# Patient Record
Sex: Male | Born: 1945
Health system: Southern US, Community
[De-identification: ages and names within clinical notes are randomized; demographics above are authoritative.]

## PROBLEM LIST (undated history)

## (undated) DIAGNOSIS — Z9981 Dependence on supplemental oxygen: Secondary | ICD-10-CM

## (undated) DIAGNOSIS — R21 Rash and other nonspecific skin eruption: Secondary | ICD-10-CM

## (undated) DIAGNOSIS — E119 Type 2 diabetes mellitus without complications: Secondary | ICD-10-CM

## (undated) DIAGNOSIS — M1990 Unspecified osteoarthritis, unspecified site: Secondary | ICD-10-CM

## (undated) DIAGNOSIS — E785 Hyperlipidemia, unspecified: Secondary | ICD-10-CM

## (undated) DIAGNOSIS — R972 Elevated prostate specific antigen [PSA]: Secondary | ICD-10-CM

## (undated) DIAGNOSIS — T4145XA Adverse effect of unspecified anesthetic, initial encounter: Secondary | ICD-10-CM

## (undated) DIAGNOSIS — I4821 Permanent atrial fibrillation: Secondary | ICD-10-CM

## (undated) DIAGNOSIS — J42 Unspecified chronic bronchitis: Secondary | ICD-10-CM

## (undated) DIAGNOSIS — T8859XA Other complications of anesthesia, initial encounter: Secondary | ICD-10-CM

## (undated) DIAGNOSIS — E678 Other specified hyperalimentation: Secondary | ICD-10-CM

## (undated) DIAGNOSIS — G4730 Sleep apnea, unspecified: Secondary | ICD-10-CM

## (undated) DIAGNOSIS — Z7901 Long term (current) use of anticoagulants: Secondary | ICD-10-CM

## (undated) DIAGNOSIS — I10 Essential (primary) hypertension: Secondary | ICD-10-CM

## (undated) DIAGNOSIS — H9190 Unspecified hearing loss, unspecified ear: Secondary | ICD-10-CM

## (undated) DIAGNOSIS — N189 Chronic kidney disease, unspecified: Secondary | ICD-10-CM

## (undated) DIAGNOSIS — I50811 Acute right heart failure: Secondary | ICD-10-CM

## (undated) DIAGNOSIS — I272 Pulmonary hypertension, unspecified: Secondary | ICD-10-CM

## (undated) DIAGNOSIS — R0902 Hypoxemia: Secondary | ICD-10-CM

## (undated) DIAGNOSIS — M109 Gout, unspecified: Secondary | ICD-10-CM

## (undated) DIAGNOSIS — G4733 Obstructive sleep apnea (adult) (pediatric): Secondary | ICD-10-CM

## (undated) DIAGNOSIS — G43009 Migraine without aura, not intractable, without status migrainosus: Secondary | ICD-10-CM

## (undated) HISTORY — DX: Hyperlipidemia, unspecified: E78.5

## (undated) HISTORY — PX: HERNIA REPAIR: SHX51

## (undated) HISTORY — DX: Type 2 diabetes mellitus without complications: E11.9

## (undated) HISTORY — DX: Rash and other nonspecific skin eruption: R21

## (undated) HISTORY — PX: UMBILICAL HERNIA REPAIR: SHX196

## (undated) HISTORY — DX: Hypoxemia: R09.02

## (undated) HISTORY — PX: INGUINAL HERNIA REPAIR: SUR1180

## (undated) HISTORY — DX: Gout, unspecified: M10.9

## (undated) HISTORY — DX: Essential (primary) hypertension: I10

## (undated) HISTORY — PX: SPINE SURGERY: SHX786

## (undated) HISTORY — DX: Elevated prostate specific antigen (PSA): R97.20

## (undated) HISTORY — DX: Sleep apnea, unspecified: G47.30

## (undated) HISTORY — PX: CARDIOVERSION: SHX1299

## (undated) HISTORY — DX: Migraine without aura, not intractable, without status migrainosus: G43.009

## (undated) HISTORY — DX: Long term (current) use of anticoagulants: Z79.01

## (undated) HISTORY — DX: Unspecified chronic bronchitis: J42

## (undated) HISTORY — DX: Other specified hyperalimentation: E67.8

## (undated) HISTORY — PX: FRACTURE SURGERY: SHX138

## (undated) HISTORY — DX: Unspecified hearing loss, unspecified ear: H91.90

## (undated) HISTORY — DX: Chronic kidney disease, unspecified: N18.9

---

## 1972-08-09 HISTORY — PX: APPENDECTOMY: SHX54

## 2004-04-01 LAB — HM COLONOSCOPY: HM Colonoscopy: NORMAL

## 2004-09-28 ENCOUNTER — Ambulatory Visit: Payer: Self-pay | Admitting: Internal Medicine

## 2005-02-22 ENCOUNTER — Ambulatory Visit: Payer: Self-pay | Admitting: Internal Medicine

## 2005-06-04 ENCOUNTER — Ambulatory Visit: Payer: Self-pay | Admitting: Internal Medicine

## 2006-03-03 ENCOUNTER — Ambulatory Visit: Payer: Self-pay | Admitting: Internal Medicine

## 2006-04-03 ENCOUNTER — Emergency Department (HOSPITAL_COMMUNITY): Admission: EM | Admit: 2006-04-03 | Discharge: 2006-04-03 | Payer: Self-pay | Admitting: Family Medicine

## 2006-07-01 ENCOUNTER — Ambulatory Visit: Payer: Self-pay | Admitting: Internal Medicine

## 2006-07-06 ENCOUNTER — Ambulatory Visit: Payer: Self-pay | Admitting: Internal Medicine

## 2007-02-17 ENCOUNTER — Ambulatory Visit: Payer: Self-pay | Admitting: Internal Medicine

## 2007-02-17 LAB — CONVERTED CEMR LAB
Albumin: 3.2 g/dL — ABNORMAL LOW (ref 3.5–5.2)
Alkaline Phosphatase: 82 units/L (ref 39–117)
BUN: 9 mg/dL (ref 6–23)
Basophils Absolute: 0 10*3/uL (ref 0.0–0.1)
Eosinophils Absolute: 0.4 10*3/uL (ref 0.0–0.6)
GFR calc Af Amer: 110 mL/min
Hemoglobin: 14.4 g/dL (ref 13.0–17.0)
Hgb A1c MFr Bld: 7.3 % — ABNORMAL HIGH (ref 4.6–6.0)
Lymphocytes Relative: 22 % (ref 12.0–46.0)
MCHC: 33.1 g/dL (ref 30.0–36.0)
MCV: 84.4 fL (ref 78.0–100.0)
Monocytes Absolute: 0.6 10*3/uL (ref 0.2–0.7)
Monocytes Relative: 5.4 % (ref 3.0–11.0)
Neutro Abs: 7.8 10*3/uL — ABNORMAL HIGH (ref 1.4–7.7)
Neutrophils Relative %: 69.1 % (ref 43.0–77.0)
PSA: 2.82 ng/mL (ref 0.10–4.00)
Potassium: 3.9 meq/L (ref 3.5–5.1)
Sodium: 143 meq/L (ref 135–145)
TSH: 1.32 microintl units/mL (ref 0.35–5.50)

## 2007-02-18 DIAGNOSIS — E785 Hyperlipidemia, unspecified: Secondary | ICD-10-CM | POA: Insufficient documentation

## 2007-02-18 DIAGNOSIS — I10 Essential (primary) hypertension: Secondary | ICD-10-CM | POA: Insufficient documentation

## 2007-03-16 ENCOUNTER — Ambulatory Visit: Payer: Self-pay | Admitting: Internal Medicine

## 2007-03-31 ENCOUNTER — Ambulatory Visit: Payer: Self-pay | Admitting: Internal Medicine

## 2007-03-31 LAB — CONVERTED CEMR LAB
BUN: 16 mg/dL (ref 6–23)
CO2: 38 meq/L — ABNORMAL HIGH (ref 19–32)
Chloride: 101 meq/L (ref 96–112)
Creatinine, Ser: 0.9 mg/dL (ref 0.4–1.5)
HDL: 55.9 mg/dL (ref >39.0)
Hgb A1c MFr Bld: 7 % — ABNORMAL HIGH (ref 4.6–6.0)
PSA: 1.21 ng/mL (ref 0.10–4.00)
Potassium: 4.5 meq/L (ref 3.5–5.1)
Sodium: 145 meq/L (ref 135–145)
Triglycerides: 56 mg/dL (ref 0–149)
VLDL: 11 mg/dL (ref 0–40)

## 2007-04-21 ENCOUNTER — Ambulatory Visit: Payer: Self-pay | Admitting: Internal Medicine

## 2007-06-09 ENCOUNTER — Ambulatory Visit: Payer: Self-pay | Admitting: Internal Medicine

## 2007-06-09 ENCOUNTER — Encounter: Payer: Self-pay | Admitting: Internal Medicine

## 2007-06-09 DIAGNOSIS — G43009 Migraine without aura, not intractable, without status migrainosus: Secondary | ICD-10-CM | POA: Insufficient documentation

## 2007-06-09 DIAGNOSIS — R972 Elevated prostate specific antigen [PSA]: Secondary | ICD-10-CM

## 2007-06-09 DIAGNOSIS — E119 Type 2 diabetes mellitus without complications: Secondary | ICD-10-CM | POA: Insufficient documentation

## 2007-06-09 DIAGNOSIS — H9190 Unspecified hearing loss, unspecified ear: Secondary | ICD-10-CM | POA: Insufficient documentation

## 2007-06-09 HISTORY — DX: Elevated prostate specific antigen (PSA): R97.20

## 2007-08-10 HISTORY — PX: TENDON REPAIR: SHX5111

## 2007-10-16 ENCOUNTER — Ambulatory Visit: Payer: Self-pay | Admitting: Internal Medicine

## 2007-10-16 LAB — CONVERTED CEMR LAB
BUN: 16 mg/dL (ref 6–23)
CO2: 33 meq/L — ABNORMAL HIGH (ref 19–32)
Creatinine, Ser: 1.3 mg/dL (ref 0.4–1.5)
GFR calc Af Amer: 72 mL/min
Glucose, Bld: 118 mg/dL — ABNORMAL HIGH (ref 70–99)
HDL: 30.2 mg/dL — ABNORMAL LOW (ref >39.0)
Potassium: 3.6 meq/L (ref 3.5–5.1)
Sodium: 141 meq/L (ref 135–145)
Total CHOL/HDL Ratio: 3.1
Triglycerides: 71 mg/dL (ref 0–149)

## 2007-10-23 ENCOUNTER — Ambulatory Visit: Payer: Self-pay | Admitting: Internal Medicine

## 2007-10-25 ENCOUNTER — Telehealth (INDEPENDENT_AMBULATORY_CARE_PROVIDER_SITE_OTHER): Payer: Self-pay | Admitting: *Deleted

## 2008-01-05 ENCOUNTER — Ambulatory Visit: Payer: Self-pay | Admitting: Internal Medicine

## 2008-01-05 DIAGNOSIS — R609 Edema, unspecified: Secondary | ICD-10-CM | POA: Insufficient documentation

## 2008-02-08 ENCOUNTER — Inpatient Hospital Stay (HOSPITAL_COMMUNITY): Admission: AD | Admit: 2008-02-08 | Discharge: 2008-02-13 | Payer: Self-pay | Admitting: General Surgery

## 2008-02-08 ENCOUNTER — Ambulatory Visit: Payer: Self-pay | Admitting: Internal Medicine

## 2008-02-08 ENCOUNTER — Ambulatory Visit: Payer: Self-pay | Admitting: Cardiology

## 2008-02-09 ENCOUNTER — Encounter (INDEPENDENT_AMBULATORY_CARE_PROVIDER_SITE_OTHER): Payer: Self-pay | Admitting: Cardiology

## 2008-02-12 ENCOUNTER — Encounter (INDEPENDENT_AMBULATORY_CARE_PROVIDER_SITE_OTHER): Payer: Self-pay | Admitting: General Surgery

## 2008-02-16 ENCOUNTER — Ambulatory Visit: Payer: Self-pay | Admitting: Cardiovascular Disease

## 2008-02-23 ENCOUNTER — Ambulatory Visit: Payer: Self-pay | Admitting: Cardiology

## 2008-02-29 ENCOUNTER — Ambulatory Visit: Payer: Self-pay | Admitting: Internal Medicine

## 2008-03-04 ENCOUNTER — Ambulatory Visit: Payer: Self-pay | Admitting: Cardiology

## 2008-03-04 LAB — CONVERTED CEMR LAB
BUN: 10 mg/dL (ref 6–23)
CO2: 35 meq/L — ABNORMAL HIGH (ref 19–32)
Calcium: 8.8 mg/dL (ref 8.4–10.5)
Creatinine, Ser: 0.9 mg/dL (ref 0.4–1.5)
GFR calc Af Amer: 110 mL/min
Glucose, Bld: 97 mg/dL (ref 70–99)
Sodium: 144 meq/L (ref 135–145)

## 2008-03-07 ENCOUNTER — Ambulatory Visit: Payer: Self-pay | Admitting: Cardiology

## 2008-03-08 ENCOUNTER — Ambulatory Visit: Payer: Self-pay

## 2008-03-14 ENCOUNTER — Ambulatory Visit: Payer: Self-pay | Admitting: Internal Medicine

## 2008-03-21 ENCOUNTER — Ambulatory Visit: Payer: Self-pay | Admitting: Internal Medicine

## 2008-03-28 ENCOUNTER — Ambulatory Visit: Payer: Self-pay | Admitting: Internal Medicine

## 2008-04-04 ENCOUNTER — Ambulatory Visit: Payer: Self-pay | Admitting: Cardiology

## 2008-04-11 ENCOUNTER — Ambulatory Visit: Payer: Self-pay | Admitting: Internal Medicine

## 2008-04-18 ENCOUNTER — Ambulatory Visit: Payer: Self-pay | Admitting: Cardiology

## 2008-04-22 ENCOUNTER — Ambulatory Visit: Payer: Self-pay | Admitting: Cardiology

## 2008-04-22 ENCOUNTER — Ambulatory Visit (HOSPITAL_COMMUNITY): Admission: RE | Admit: 2008-04-22 | Discharge: 2008-04-22 | Payer: Self-pay | Admitting: Cardiology

## 2008-04-29 ENCOUNTER — Telehealth (INDEPENDENT_AMBULATORY_CARE_PROVIDER_SITE_OTHER): Payer: Self-pay | Admitting: *Deleted

## 2008-04-29 ENCOUNTER — Ambulatory Visit: Payer: Self-pay | Admitting: Internal Medicine

## 2008-04-30 ENCOUNTER — Ambulatory Visit: Payer: Self-pay | Admitting: Cardiology

## 2008-04-30 LAB — CONVERTED CEMR LAB
Basophils Absolute: 0.1 10*3/uL (ref 0.0–0.1)
Basophils Relative: 0.5 % (ref 0.0–3.0)
HCT: 46.6 % (ref 39.0–52.0)
Hemoglobin: 15.3 g/dL (ref 13.0–17.0)
Lymphocytes Relative: 10.3 % — ABNORMAL LOW (ref 12.0–46.0)
MCHC: 32.9 g/dL (ref 30.0–36.0)
MCV: 81.6 fL (ref 78.0–100.0)
Monocytes Absolute: 0.3 10*3/uL (ref 0.1–1.0)
Neutro Abs: 13.1 10*3/uL — ABNORMAL HIGH (ref 1.4–7.7)
Prothrombin Time: 37.2 s — ABNORMAL HIGH (ref 10.9–13.3)
RBC: 5.71 M/uL (ref 4.22–5.81)
RDW: 16.4 % — ABNORMAL HIGH (ref 11.5–14.6)

## 2008-05-01 ENCOUNTER — Encounter: Payer: Self-pay | Admitting: Internal Medicine

## 2008-05-01 ENCOUNTER — Telehealth (INDEPENDENT_AMBULATORY_CARE_PROVIDER_SITE_OTHER): Payer: Self-pay | Admitting: *Deleted

## 2008-05-02 ENCOUNTER — Ambulatory Visit: Payer: Self-pay | Admitting: Cardiology

## 2008-05-14 ENCOUNTER — Ambulatory Visit: Payer: Self-pay | Admitting: Cardiovascular Disease

## 2008-05-15 ENCOUNTER — Ambulatory Visit: Payer: Self-pay | Admitting: Internal Medicine

## 2008-05-21 ENCOUNTER — Ambulatory Visit (HOSPITAL_COMMUNITY): Admission: RE | Admit: 2008-05-21 | Discharge: 2008-05-21 | Payer: Self-pay | Admitting: Internal Medicine

## 2008-05-21 ENCOUNTER — Ambulatory Visit: Payer: Self-pay | Admitting: Internal Medicine

## 2008-05-22 ENCOUNTER — Telehealth: Payer: Self-pay | Admitting: Internal Medicine

## 2008-05-24 ENCOUNTER — Ambulatory Visit: Payer: Self-pay | Admitting: Pulmonary Disease

## 2008-05-27 ENCOUNTER — Ambulatory Visit: Payer: Self-pay | Admitting: Internal Medicine

## 2008-05-31 ENCOUNTER — Encounter: Payer: Self-pay | Admitting: Internal Medicine

## 2008-06-04 ENCOUNTER — Ambulatory Visit: Payer: Self-pay | Admitting: Cardiology

## 2008-06-11 ENCOUNTER — Ambulatory Visit: Payer: Self-pay | Admitting: Internal Medicine

## 2008-06-18 ENCOUNTER — Ambulatory Visit (HOSPITAL_BASED_OUTPATIENT_CLINIC_OR_DEPARTMENT_OTHER): Admission: RE | Admit: 2008-06-18 | Discharge: 2008-06-18 | Payer: Self-pay | Admitting: Pulmonary Disease

## 2008-06-18 ENCOUNTER — Ambulatory Visit: Payer: Self-pay | Admitting: Internal Medicine

## 2008-06-18 ENCOUNTER — Encounter: Payer: Self-pay | Admitting: Pulmonary Disease

## 2008-06-25 ENCOUNTER — Ambulatory Visit: Payer: Self-pay | Admitting: Cardiovascular Disease

## 2008-06-27 ENCOUNTER — Ambulatory Visit: Payer: Self-pay | Admitting: Internal Medicine

## 2008-06-27 LAB — CONVERTED CEMR LAB
Basophils Absolute: 0 10*3/uL (ref 0.0–0.1)
Bilirubin Urine: NEGATIVE
Bilirubin, Direct: 0.1 mg/dL (ref 0.0–0.3)
CO2: 31 meq/L (ref 19–32)
Calcium: 9.4 mg/dL (ref 8.4–10.5)
Cholesterol: 121 mg/dL (ref 0–200)
Creatinine, Ser: 0.9 mg/dL (ref 0.4–1.5)
Creatinine,U: 49.3 mg/dL
Eosinophils Absolute: 0.2 10*3/uL (ref 0.0–0.7)
GFR calc Af Amer: 110 mL/min
GFR calc non Af Amer: 91 mL/min
HDL: 40.1 mg/dL (ref >39.0)
Hemoglobin: 15.4 g/dL (ref 13.0–17.0)
Ketones, ur: NEGATIVE mg/dL
LDL Cholesterol: 61 mg/dL (ref 0–99)
Leukocytes, UA: NEGATIVE
Lymphocytes Relative: 26.9 % (ref 12.0–46.0)
MCHC: 33.2 g/dL (ref 30.0–36.0)
MCV: 82 fL (ref 78.0–100.0)
Microalb Creat Ratio: 81.1 mg/g — ABNORMAL HIGH (ref 0.0–30.0)
Microalb, Ur: 4 mg/dL — ABNORMAL HIGH (ref 0.0–1.9)
Monocytes Absolute: 0.6 10*3/uL (ref 0.1–1.0)
Neutro Abs: 6.3 10*3/uL (ref 1.4–7.7)
PSA: 2.65 ng/mL (ref 0.10–4.00)
Platelets: 264 10*3/uL (ref 150–400)
Potassium: 4.1 meq/L (ref 3.5–5.1)
RDW: 18.5 % — ABNORMAL HIGH (ref 11.5–14.6)
Sodium: 148 meq/L — ABNORMAL HIGH (ref 135–145)
TSH: 2.63 microintl units/mL (ref 0.35–5.50)
Total Bilirubin: 1.2 mg/dL (ref 0.3–1.2)
Total CHOL/HDL Ratio: 3
Triglycerides: 98 mg/dL (ref 0–149)
Urine Glucose: NEGATIVE mg/dL
Urobilinogen, UA: 0.2 (ref 0.0–1.0)
pH: 7.5 (ref 5.0–8.0)

## 2008-07-01 ENCOUNTER — Ambulatory Visit: Payer: Self-pay | Admitting: Pulmonary Disease

## 2008-07-02 ENCOUNTER — Ambulatory Visit: Payer: Self-pay | Admitting: Internal Medicine

## 2008-07-11 ENCOUNTER — Ambulatory Visit: Payer: Self-pay | Admitting: Internal Medicine

## 2008-07-15 ENCOUNTER — Ambulatory Visit: Payer: Self-pay | Admitting: Internal Medicine

## 2008-07-25 ENCOUNTER — Ambulatory Visit: Payer: Self-pay | Admitting: Cardiology

## 2008-07-25 LAB — CONVERTED CEMR LAB
ALT: 13 units/L (ref 0–53)
Albumin: 3.2 g/dL — ABNORMAL LOW (ref 3.5–5.2)
Alkaline Phosphatase: 65 units/L (ref 39–117)
Basophils Relative: 0.6 % (ref 0.0–3.0)
Bilirubin, Direct: 0.1 mg/dL (ref 0.0–0.3)
Eosinophils Absolute: 0.2 10*3/uL (ref 0.0–0.7)
Eosinophils Relative: 2 % (ref 0.0–5.0)
HCT: 47.1 % (ref 39.0–52.0)
MCV: 82.8 fL (ref 78.0–100.0)
Monocytes Absolute: 0.9 10*3/uL (ref 0.1–1.0)
Monocytes Relative: 8.1 % (ref 3.0–12.0)
Neutrophils Relative %: 61.8 % (ref 43.0–77.0)
Platelets: 304 10*3/uL (ref 150–400)
RBC: 5.68 M/uL (ref 4.22–5.81)
Total CHOL/HDL Ratio: 3.5
Total Protein: 7 g/dL (ref 6.0–8.3)
WBC: 10.5 10*3/uL (ref 4.5–10.5)

## 2008-07-31 ENCOUNTER — Encounter: Payer: Self-pay | Admitting: Internal Medicine

## 2008-08-01 ENCOUNTER — Ambulatory Visit: Payer: Self-pay | Admitting: Cardiovascular Disease

## 2008-08-06 ENCOUNTER — Ambulatory Visit: Payer: Self-pay | Admitting: Cardiology

## 2008-08-26 ENCOUNTER — Encounter: Payer: Self-pay | Admitting: Internal Medicine

## 2008-09-05 ENCOUNTER — Ambulatory Visit: Payer: Self-pay | Admitting: Internal Medicine

## 2008-10-04 ENCOUNTER — Ambulatory Visit: Payer: Self-pay | Admitting: Pulmonary Disease

## 2008-10-04 ENCOUNTER — Ambulatory Visit: Payer: Self-pay | Admitting: Internal Medicine

## 2008-10-13 ENCOUNTER — Encounter: Payer: Self-pay | Admitting: Pulmonary Disease

## 2008-10-16 ENCOUNTER — Ambulatory Visit: Payer: Self-pay | Admitting: Cardiology

## 2008-10-16 DIAGNOSIS — I4821 Permanent atrial fibrillation: Secondary | ICD-10-CM

## 2008-10-16 LAB — CONVERTED CEMR LAB
Basophils Absolute: 0 10*3/uL (ref 0.0–0.1)
CO2: 31 meq/L (ref 19–32)
Chloride: 106 meq/L (ref 96–112)
Glucose, Bld: 133 mg/dL — ABNORMAL HIGH (ref 70–99)
Hemoglobin: 15.8 g/dL (ref 13.0–17.0)
Lymphocytes Relative: 24.5 % (ref 12.0–46.0)
MCHC: 32.7 g/dL (ref 30.0–36.0)
Monocytes Relative: 5.8 % (ref 3.0–12.0)
Neutrophils Relative %: 67.6 % (ref 43.0–77.0)
Potassium: 3.9 meq/L (ref 3.5–5.1)
RDW: 14.9 % — ABNORMAL HIGH (ref 11.5–14.6)
Sodium: 144 meq/L (ref 135–145)

## 2008-10-17 ENCOUNTER — Encounter: Payer: Self-pay | Admitting: Pulmonary Disease

## 2008-11-01 ENCOUNTER — Ambulatory Visit: Payer: Self-pay | Admitting: Cardiology

## 2008-11-02 ENCOUNTER — Encounter: Payer: Self-pay | Admitting: Pulmonary Disease

## 2008-11-14 ENCOUNTER — Encounter: Payer: Self-pay | Admitting: Pulmonary Disease

## 2008-11-26 ENCOUNTER — Encounter: Payer: Self-pay | Admitting: Pulmonary Disease

## 2008-11-26 ENCOUNTER — Ambulatory Visit (HOSPITAL_BASED_OUTPATIENT_CLINIC_OR_DEPARTMENT_OTHER): Admission: RE | Admit: 2008-11-26 | Discharge: 2008-11-26 | Payer: Self-pay | Admitting: Pulmonary Disease

## 2008-11-26 DIAGNOSIS — E662 Morbid (severe) obesity with alveolar hypoventilation: Secondary | ICD-10-CM

## 2008-11-29 ENCOUNTER — Ambulatory Visit: Payer: Self-pay | Admitting: Cardiology

## 2008-12-04 ENCOUNTER — Ambulatory Visit: Payer: Self-pay | Admitting: Pulmonary Disease

## 2008-12-10 ENCOUNTER — Encounter: Payer: Self-pay | Admitting: Pulmonary Disease

## 2008-12-11 ENCOUNTER — Encounter: Payer: Self-pay | Admitting: Pulmonary Disease

## 2008-12-26 ENCOUNTER — Ambulatory Visit: Payer: Self-pay | Admitting: Internal Medicine

## 2008-12-26 LAB — CONVERTED CEMR LAB
BUN: 14 mg/dL (ref 6–23)
CO2: 32 meq/L (ref 19–32)
Chloride: 106 meq/L (ref 96–112)
GFR calc non Af Amer: 90.57 mL/min (ref >60)
Glucose, Bld: 105 mg/dL — ABNORMAL HIGH (ref 70–99)
HDL: 33.3 mg/dL — ABNORMAL LOW (ref >39.00)
Hgb A1c MFr Bld: 6.3 % (ref 4.6–6.5)
LDL Cholesterol: 58 mg/dL (ref 0–99)
Potassium: 3.7 meq/L (ref 3.5–5.1)
Sodium: 145 meq/L (ref 135–145)
Total CHOL/HDL Ratio: 3
VLDL: 17.2 mg/dL (ref 0.0–40.0)

## 2008-12-27 ENCOUNTER — Ambulatory Visit: Payer: Self-pay | Admitting: Cardiology

## 2008-12-30 ENCOUNTER — Encounter: Payer: Self-pay | Admitting: Pulmonary Disease

## 2008-12-31 ENCOUNTER — Ambulatory Visit: Payer: Self-pay | Admitting: Internal Medicine

## 2009-01-02 ENCOUNTER — Ambulatory Visit: Payer: Self-pay | Admitting: Pulmonary Disease

## 2009-01-02 DIAGNOSIS — J42 Unspecified chronic bronchitis: Secondary | ICD-10-CM

## 2009-01-02 HISTORY — DX: Unspecified chronic bronchitis: J42

## 2009-01-07 ENCOUNTER — Encounter: Payer: Self-pay | Admitting: *Deleted

## 2009-01-17 ENCOUNTER — Ambulatory Visit: Payer: Self-pay | Admitting: Cardiovascular Disease

## 2009-01-28 ENCOUNTER — Encounter: Payer: Self-pay | Admitting: Pulmonary Disease

## 2009-01-28 ENCOUNTER — Ambulatory Visit (HOSPITAL_BASED_OUTPATIENT_CLINIC_OR_DEPARTMENT_OTHER): Admission: RE | Admit: 2009-01-28 | Discharge: 2009-01-28 | Payer: Self-pay | Admitting: Pulmonary Disease

## 2009-01-31 ENCOUNTER — Ambulatory Visit: Payer: Self-pay | Admitting: Cardiology

## 2009-01-31 LAB — CONVERTED CEMR LAB: Prothrombin Time: 20.7 s

## 2009-02-12 ENCOUNTER — Ambulatory Visit: Payer: Self-pay | Admitting: Pulmonary Disease

## 2009-02-12 ENCOUNTER — Encounter: Payer: Self-pay | Admitting: *Deleted

## 2009-02-21 ENCOUNTER — Ambulatory Visit: Payer: Self-pay | Admitting: Cardiology

## 2009-02-24 ENCOUNTER — Telehealth (INDEPENDENT_AMBULATORY_CARE_PROVIDER_SITE_OTHER): Payer: Self-pay | Admitting: *Deleted

## 2009-02-25 ENCOUNTER — Encounter (INDEPENDENT_AMBULATORY_CARE_PROVIDER_SITE_OTHER): Payer: Self-pay | Admitting: *Deleted

## 2009-03-07 ENCOUNTER — Telehealth: Payer: Self-pay | Admitting: Internal Medicine

## 2009-03-12 ENCOUNTER — Telehealth: Payer: Self-pay | Admitting: Internal Medicine

## 2009-03-19 ENCOUNTER — Telehealth: Payer: Self-pay | Admitting: Internal Medicine

## 2009-03-21 ENCOUNTER — Ambulatory Visit: Payer: Self-pay | Admitting: Internal Medicine

## 2009-03-21 LAB — CONVERTED CEMR LAB: POC INR: 2.7

## 2009-03-27 ENCOUNTER — Encounter: Payer: Self-pay | Admitting: Pulmonary Disease

## 2009-04-03 ENCOUNTER — Encounter: Payer: Self-pay | Admitting: Pulmonary Disease

## 2009-04-03 ENCOUNTER — Ambulatory Visit: Payer: Self-pay | Admitting: Pulmonary Disease

## 2009-04-18 ENCOUNTER — Ambulatory Visit: Payer: Self-pay | Admitting: Internal Medicine

## 2009-04-18 LAB — CONVERTED CEMR LAB: POC INR: 2.5

## 2009-05-16 ENCOUNTER — Ambulatory Visit: Payer: Self-pay | Admitting: Internal Medicine

## 2009-05-16 LAB — CONVERTED CEMR LAB: POC INR: 2.7

## 2009-06-13 ENCOUNTER — Ambulatory Visit: Payer: Self-pay | Admitting: Cardiovascular Disease

## 2009-06-20 ENCOUNTER — Ambulatory Visit: Payer: Self-pay | Admitting: Internal Medicine

## 2009-06-20 LAB — CONVERTED CEMR LAB
ALT: 20 units/L (ref 0–53)
Albumin: 3.3 g/dL — ABNORMAL LOW (ref 3.5–5.2)
BUN: 11 mg/dL (ref 6–23)
Basophils Relative: 0.7 % (ref 0.0–3.0)
Calcium: 8.7 mg/dL (ref 8.4–10.5)
Cholesterol: 120 mg/dL (ref 0–200)
Eosinophils Relative: 2.2 % (ref 0.0–5.0)
GFR calc non Af Amer: 90.43 mL/min (ref >60)
Glucose, Bld: 94 mg/dL (ref 70–99)
HCT: 46.7 % (ref 39.0–52.0)
HDL: 33.7 mg/dL — ABNORMAL LOW (ref >39.00)
Hgb A1c MFr Bld: 6.3 % (ref 4.6–6.5)
Ketones, ur: NEGATIVE mg/dL
Leukocytes, UA: NEGATIVE
Lymphs Abs: 2.2 10*3/uL (ref 0.7–4.0)
MCHC: 33 g/dL (ref 30.0–36.0)
MCV: 87.6 fL (ref 78.0–100.0)
Monocytes Absolute: 0.7 10*3/uL (ref 0.1–1.0)
RBC: 5.34 M/uL (ref 4.22–5.81)
Sodium: 145 meq/L (ref 135–145)
Specific Gravity, Urine: 1.01 (ref 1.000–1.030)
TSH: 1.48 microintl units/mL (ref 0.35–5.50)
Total Protein, Urine: 100 mg/dL
Total Protein: 7.3 g/dL (ref 6.0–8.3)
Urine Glucose: NEGATIVE mg/dL
WBC: 8.8 10*3/uL (ref 4.5–10.5)
pH: 7.5 (ref 5.0–8.0)

## 2009-06-24 ENCOUNTER — Ambulatory Visit: Payer: Self-pay | Admitting: Internal Medicine

## 2009-07-11 ENCOUNTER — Ambulatory Visit: Payer: Self-pay | Admitting: Cardiovascular Disease

## 2009-07-11 LAB — CONVERTED CEMR LAB: POC INR: 2.5

## 2009-08-12 ENCOUNTER — Ambulatory Visit: Payer: Self-pay | Admitting: Cardiology

## 2009-08-12 LAB — CONVERTED CEMR LAB: POC INR: 2.1

## 2009-08-25 ENCOUNTER — Ambulatory Visit: Payer: Self-pay | Admitting: Cardiology

## 2009-09-09 ENCOUNTER — Ambulatory Visit: Payer: Self-pay | Admitting: Internal Medicine

## 2009-10-07 ENCOUNTER — Ambulatory Visit: Payer: Self-pay | Admitting: Cardiovascular Disease

## 2009-10-07 LAB — CONVERTED CEMR LAB: POC INR: 2.4

## 2009-11-04 ENCOUNTER — Ambulatory Visit: Payer: Self-pay | Admitting: Internal Medicine

## 2009-11-04 LAB — CONVERTED CEMR LAB: POC INR: 2

## 2009-11-21 ENCOUNTER — Telehealth: Payer: Self-pay | Admitting: Internal Medicine

## 2009-12-02 ENCOUNTER — Ambulatory Visit: Payer: Self-pay | Admitting: Cardiovascular Disease

## 2009-12-02 ENCOUNTER — Encounter: Payer: Self-pay | Admitting: Pulmonary Disease

## 2009-12-18 ENCOUNTER — Ambulatory Visit: Payer: Self-pay | Admitting: Internal Medicine

## 2009-12-18 LAB — CONVERTED CEMR LAB
BUN: 13 mg/dL (ref 6–23)
Chloride: 104 meq/L (ref 96–112)
Cholesterol: 122 mg/dL (ref 0–200)
Creatinine, Ser: 0.8 mg/dL (ref 0.4–1.5)
LDL Cholesterol: 67 mg/dL (ref 0–99)
Total CHOL/HDL Ratio: 3

## 2009-12-23 ENCOUNTER — Ambulatory Visit: Payer: Self-pay | Admitting: Internal Medicine

## 2009-12-30 ENCOUNTER — Ambulatory Visit: Payer: Self-pay | Admitting: Internal Medicine

## 2009-12-30 LAB — CONVERTED CEMR LAB: POC INR: 2.5

## 2010-02-20 ENCOUNTER — Ambulatory Visit: Payer: Self-pay | Admitting: Cardiology

## 2010-02-20 LAB — CONVERTED CEMR LAB: POC INR: 2.8

## 2010-03-12 ENCOUNTER — Telehealth: Payer: Self-pay | Admitting: Internal Medicine

## 2010-03-12 ENCOUNTER — Ambulatory Visit: Payer: Self-pay | Admitting: Internal Medicine

## 2010-03-12 DIAGNOSIS — R21 Rash and other nonspecific skin eruption: Secondary | ICD-10-CM

## 2010-03-12 DIAGNOSIS — L03119 Cellulitis of unspecified part of limb: Secondary | ICD-10-CM

## 2010-03-12 DIAGNOSIS — L02419 Cutaneous abscess of limb, unspecified: Secondary | ICD-10-CM | POA: Insufficient documentation

## 2010-03-12 HISTORY — DX: Rash and other nonspecific skin eruption: R21

## 2010-03-20 ENCOUNTER — Ambulatory Visit: Payer: Self-pay | Admitting: Internal Medicine

## 2010-03-26 ENCOUNTER — Ambulatory Visit: Payer: Self-pay | Admitting: Internal Medicine

## 2010-04-01 ENCOUNTER — Telehealth (INDEPENDENT_AMBULATORY_CARE_PROVIDER_SITE_OTHER): Payer: Self-pay | Admitting: *Deleted

## 2010-04-03 ENCOUNTER — Ambulatory Visit: Payer: Self-pay | Admitting: Pulmonary Disease

## 2010-04-08 ENCOUNTER — Encounter: Payer: Self-pay | Admitting: Pulmonary Disease

## 2010-04-09 ENCOUNTER — Encounter: Payer: Self-pay | Admitting: Pulmonary Disease

## 2010-04-09 ENCOUNTER — Ambulatory Visit: Payer: Self-pay | Admitting: Internal Medicine

## 2010-04-09 LAB — CONVERTED CEMR LAB
BUN: 15 mg/dL (ref 6–23)
Calcium: 9 mg/dL (ref 8.4–10.5)
Creatinine, Ser: 0.9 mg/dL (ref 0.4–1.5)
GFR calc non Af Amer: 85.78 mL/min (ref >60)
Potassium: 4.2 meq/L (ref 3.5–5.1)

## 2010-04-17 ENCOUNTER — Ambulatory Visit: Payer: Self-pay | Admitting: Internal Medicine

## 2010-04-17 LAB — CONVERTED CEMR LAB: POC INR: 3.6

## 2010-04-25 ENCOUNTER — Encounter: Payer: Self-pay | Admitting: Pulmonary Disease

## 2010-04-27 ENCOUNTER — Encounter: Payer: Self-pay | Admitting: Pulmonary Disease

## 2010-05-11 ENCOUNTER — Ambulatory Visit: Payer: Self-pay | Admitting: Cardiology

## 2010-06-08 ENCOUNTER — Encounter: Payer: Self-pay | Admitting: Internal Medicine

## 2010-06-08 ENCOUNTER — Ambulatory Visit: Payer: Self-pay | Admitting: Surgery

## 2010-06-09 ENCOUNTER — Ambulatory Visit: Payer: Self-pay | Admitting: Cardiovascular Disease

## 2010-06-22 ENCOUNTER — Ambulatory Visit: Payer: Self-pay | Admitting: Pulmonary Disease

## 2010-06-22 ENCOUNTER — Ambulatory Visit: Payer: Self-pay | Admitting: Internal Medicine

## 2010-06-22 LAB — CONVERTED CEMR LAB
AST: 19 units/L (ref 0–37)
Alkaline Phosphatase: 63 units/L (ref 39–117)
Bilirubin Urine: NEGATIVE
Calcium: 8.6 mg/dL (ref 8.4–10.5)
Creatinine,U: 94.1 mg/dL
Eosinophils Absolute: 0.2 10*3/uL (ref 0.0–0.7)
GFR calc non Af Amer: 78.91 mL/min (ref >60)
HDL: 37.2 mg/dL — ABNORMAL LOW (ref >39.00)
Ketones, ur: NEGATIVE mg/dL
MCHC: 33 g/dL (ref 30.0–36.0)
MCV: 84 fL (ref 78.0–100.0)
Microalb, Ur: 9.4 mg/dL — ABNORMAL HIGH (ref 0.0–1.9)
Monocytes Absolute: 0.9 10*3/uL (ref 0.1–1.0)
Neutrophils Relative %: 62.5 % (ref 43.0–77.0)
Nitrite: NEGATIVE
Platelets: 269 10*3/uL (ref 150.0–400.0)
Potassium: 4.2 meq/L (ref 3.5–5.1)
RDW: 17.9 % — ABNORMAL HIGH (ref 11.5–14.6)
Sodium: 141 meq/L (ref 135–145)
TSH: 1.62 microintl units/mL (ref 0.35–5.50)
Total Bilirubin: 1.2 mg/dL (ref 0.3–1.2)
Total CHOL/HDL Ratio: 3
Total Protein, Urine: NEGATIVE mg/dL
Triglycerides: 124 mg/dL (ref 0.0–149.0)
VLDL: 24.8 mg/dL (ref 0.0–40.0)

## 2010-06-26 ENCOUNTER — Ambulatory Visit: Payer: Self-pay | Admitting: Internal Medicine

## 2010-06-26 ENCOUNTER — Encounter: Payer: Self-pay | Admitting: Internal Medicine

## 2010-06-26 DIAGNOSIS — K921 Melena: Secondary | ICD-10-CM

## 2010-06-30 ENCOUNTER — Ambulatory Visit: Payer: Self-pay | Admitting: Cardiology

## 2010-06-30 LAB — CONVERTED CEMR LAB: POC INR: 2.7

## 2010-07-28 ENCOUNTER — Ambulatory Visit: Payer: Self-pay | Admitting: Cardiology

## 2010-08-18 ENCOUNTER — Ambulatory Visit: Admission: RE | Admit: 2010-08-18 | Discharge: 2010-08-18 | Payer: Self-pay | Source: Home / Self Care

## 2010-08-18 LAB — CONVERTED CEMR LAB: POC INR: 3.2

## 2010-08-21 ENCOUNTER — Telehealth: Payer: Self-pay | Admitting: Internal Medicine

## 2010-08-30 ENCOUNTER — Encounter: Payer: Self-pay | Admitting: Internal Medicine

## 2010-08-31 ENCOUNTER — Encounter: Payer: Self-pay | Admitting: Internal Medicine

## 2010-08-31 ENCOUNTER — Ambulatory Visit
Admission: RE | Admit: 2010-08-31 | Discharge: 2010-08-31 | Payer: Self-pay | Source: Home / Self Care | Attending: Surgery | Admitting: Surgery

## 2010-09-01 NOTE — Assessment & Plan Note (Addendum)
OFFICE VISIT  [name], [name] DOB:  [birth year]                                       08/31/2010 CHART#:[id number]  REASON FOR VISIT:  Follow up leg swelling.  HISTORY:  This is a very pleasant 65 year old gentleman that I initially saw at the request of Dr. Terri Piedra for bilateral leg swelling which had been occurring for the past 2 years.  At that time he had several open wounds.  These have subsequently all healed.  I fit him for compression stockings and evaluated him with ultrasound.  Ultrasound was negative for reflux.  Unfortunately he is unable to get the stockings over his legs and get them on to wear them.  Currently he does not endorse active wounds.  PHYSICAL EXAM:  Vital signs:  Heart rate 84, blood pressure 162/87, respiratory rate 16.  General:  He is well-appearing, in no distress. Abdomen:  Obese.  Extremities:  Reveal significant pitting edema without active ulcerations, significant hyperpigmentation.  At this point I think we are left with leg elevation and compression stockings.  I have told him to revisit one of the compression stocking distribution stores to see if there are any tricks to getting the socks on.  They are going to do this.  I am also going to recommend that he sees a lymphedema therapist to [place] to see if there are any massage techniques that may help minimize the swelling.  He understands that this will be a life long chronic problem he has to deal with.    [name], MD Electronically Signed  VWB/MEDQ  D:  08/31/2010  T:  09/01/2010  Job:  [id number]  cc:   Elinor Parkinson. Worthy Rancher, M.D. Corwin Levins, MD

## 2010-09-08 ENCOUNTER — Ambulatory Visit: Admission: RE | Admit: 2010-09-08 | Discharge: 2010-09-08 | Payer: Self-pay | Source: Home / Self Care

## 2010-09-08 NOTE — Progress Notes (Signed)
Summary: med Refill  Phone Note From Pharmacy   Caller: [facility]* Summary of Call: Pharmacy called requesting refill on Diltiazem HCL 300mg . Pharmacy stated Dr. Jens Som changed to 300mg  08/12/2008 Initial call taken by: Scharlene Gloss,  November 21, 2009 3:00 PM  Follow-up for Phone Call        ok - to robin to handle Follow-up by: Corwin Levins MD,  November 21, 2009 3:14 PM    New/Updated Medications: DILTIAZEM HCL ER BEADS 300 MG XR24H-CAP (DILTIAZEM HCL ER BEADS) 1 by mouth once daily Prescriptions: DILTIAZEM HCL ER BEADS 300 MG XR24H-CAP (DILTIAZEM HCL ER BEADS) 1 by mouth once daily  #30 x 8   Entered by:   Scharlene Gloss   Authorized by:   Corwin Levins MD   Signed by:   Scharlene Gloss on 11/21/2009   Method used:   Faxed to ...       [facility]       [address] [place], [place]  [address]       Ph: [phone] or [phone]       Fax: [phone]   RxID:   [id number]

## 2010-09-08 NOTE — Letter (Signed)
Summary: CMN for CPAP Supplies/[facility]  CMN for CPAP Supplies/[facility]   Imported By: Sherian Rein 12/08/2009 07:54:18  _____________________________________________________________________  External Attachment:    Type:   Image     Comment:   External Document

## 2010-09-08 NOTE — Letter (Signed)
Summary: Statement of Medical Necessity/ [facility]  Statement of Medical Necessity/ [facility]   Imported By: Lennie Odor 05/04/2010 11:22:04  _____________________________________________________________________  External Attachment:    Type:   Image     Comment:   External Document

## 2010-09-08 NOTE — Medication Information (Signed)
Summary: rov/tm  Anticoagulant Therapy  Managed by: Cloyde Reams, RN, BSN Referring MD: Olga Millers MD PCP: Dr. Gwen Pounds MD: Eden Emms MD, Theron Arista Indication 1: Atrial Fibrillation (ICD-427.31) Lab Used: [facility]  Site: [facility] INR POC 2.5 INR RANGE 2 - 3  Dietary changes: no    Health status changes: no    Bleeding/hemorrhagic complications: no    Recent/future hospitalizations: no    Any changes in medication regimen? no    Recent/future dental: no  Any missed doses?: no       Is patient compliant with meds? yes       Allergies (verified): No Known Drug Allergies  Anticoagulation Management History:      The patient is taking warfarin and comes in today for a routine follow up visit.  Positive risk factors for bleeding include presence of serious comorbidities.  Negative risk factors for bleeding include an age less than 83 years old.  The bleeding index is 'intermediate risk'.  Positive CHADS2 values include History of HTN and History of Diabetes.  Negative CHADS2 values include Age > 16 years old.  The start date was 02/09/2008.  His last INR was 3.6 RATIO.  Anticoagulation responsible provider: Eden Emms MD, Theron Arista.  INR POC: 2.5.  Cuvette Lot#: [id number].  Exp: 01/2011.    Anticoagulation Management Assessment/Plan:      The patient's current anticoagulation dose is Warfarin sodium 5 mg tabs: use asd.  The target INR is 2 - 3.  The next INR is due 12/30/2009.  Anticoagulation instructions were given to patient.  Results were reviewed/authorized by Cloyde Reams, RN, BSN.  He was notified by Cloyde Reams RN.         Prior Anticoagulation Instructions: INR 2.0 Today take 1 1/2 pills, then resume 1 pilll everyday except 1/2 pill on Mondays. Recheck in 4 weeks.    Current Anticoagulation Instructions: INR 2.5  Continue on same dosage 1 tablet daily except 1/2 tablet on Mondays.  Recheck in 4 weeks.

## 2010-09-08 NOTE — Medication Information (Signed)
Summary: rov/ewj  Anticoagulant Therapy  Managed by: Bethena Midget, RN, BSN Referring MD: Olga Millers MD PCP: Dr. Gwen Pounds MD: Juanda Chance MD, Iman Reinertsen Indication 1: Atrial Fibrillation (ICD-427.31) Lab Used: [facility] Site: [facility] INR POC 2.1 INR RANGE 2 - 3  Dietary changes: no    Health status changes: no    Bleeding/hemorrhagic complications: no    Recent/future hospitalizations: no    Any changes in medication regimen? no    Recent/future dental: no  Any missed doses?: no       Is patient compliant with meds? yes       Allergies: No Known Drug Allergies  Anticoagulation Management History:      The patient is taking warfarin and comes in today for a routine follow up visit.  Positive risk factors for bleeding include presence of serious comorbidities.  Negative risk factors for bleeding include an age less than 24 years old.  The bleeding index is 'intermediate risk'.  Positive CHADS2 values include History of HTN and History of Diabetes.  Negative CHADS2 values include Age > 79 years old.  The start date was 02/09/2008.  His last INR was 3.6 RATIO.  Anticoagulation responsible provider: Juanda Chance MD, Smitty Cords.  INR POC: 2.1.  Cuvette Lot#: [id number].  Exp: 09/2010.    Anticoagulation Management Assessment/Plan:      The patient's current anticoagulation dose is Warfarin sodium 5 mg tabs: use asd.  The target INR is 2 - 3.  The next INR is due 09/09/2009.  Anticoagulation instructions were given to patient.  Results were reviewed/authorized by Bethena Midget, RN, BSN.  He was notified by Bethena Midget, RN, BSN.         Prior Anticoagulation Instructions: INR 2.5  Take 1.5 tablets today then resume same dosage 1 tablet daily except 1/2 tablet on Mondays.  Recheck in 4 weeks.    Current Anticoagulation Instructions: INR 2.1 Continue 5mg s everyday except 2.5mg s on Mondays. Recheck in 4 weeks.

## 2010-09-08 NOTE — Medication Information (Signed)
Summary: rov/tm  Anticoagulant Therapy  Managed by: Eda Keys, PharmD Referring MD: Olga Millers MD PCP: Dr. Gwen Pounds MD: Graciela Husbands MD, Viviann Spare Indication 1: Atrial Fibrillation (ICD-427.31) Lab Used: [facility]  Site: [facility] INR POC 2.3 INR RANGE 2 - 3  Dietary changes: no    Health status changes: no    Bleeding/hemorrhagic complications: no    Recent/future hospitalizations: no    Any changes in medication regimen? no    Recent/future dental: no  Any missed doses?: no       Is patient compliant with meds? yes       Current Medications (verified): 1)  Allopurinol 100 Mg Tabs (Allopurinol) .Marland Kitchen.. 1 Tablet By Mouth Once A Day 2)  Metoprolol Tartrate 50 Mg Tabs (Metoprolol Tartrate) .Marland Kitchen.. 1 By Mouth Two Times A Day 3)  Lisinopril 20 Mg Tabs (Lisinopril) .Marland Kitchen.. 1 By Mouth Once Daily 4)  Cardizem La 360 Mg Xr24h-Tab (Diltiazem Hcl Coated Beads) .Marland Kitchen.. 1 By Mouth Daily 5)  Warfarin Sodium 5 Mg Tabs (Warfarin Sodium) .... Use Asd 6)  Furosemide 40 Mg Tabs (Furosemide) .... Take 1 Two Times A Day 7)  Klor-Con M20 20 Meq Tbcr (Potassium Chloride Crys Cr) .... Take 1 Tablet By Mouth Once A Day 8)  Simvastatin 40 Mg Tabs (Simvastatin) .... Take 1 Tablet By Mouth Once A Day 9)  Glimepiride 1 Mg  Tabs (Glimepiride) .Marland Kitchen.. 1 and 1/2 By Mouth Qam 10)  Onetouch Ultra Test  Strp (Glucose Blood) .... Use 1strip  Two Times A Day To Test Blood Glucose  Allergies (verified): No Known Drug Allergies  Anticoagulation Management History:      The patient is taking warfarin and comes in today for a routine follow up visit.  Positive risk factors for bleeding include presence of serious comorbidities.  Negative risk factors for bleeding include an age less than 22 years old.  The bleeding index is 'intermediate risk'.  Positive CHADS2 values include History of HTN and History of Diabetes.  Negative CHADS2 values include Age > 6 years old.  The start date was 02/09/2008.  His last INR  was 3.6 RATIO.  Anticoagulation responsible provider: Graciela Husbands MD, Viviann Spare.  INR POC: 2.3.  Cuvette Lot#: [id number].  Exp: 11/2010.    Anticoagulation Management Assessment/Plan:      The patient's current anticoagulation dose is Warfarin sodium 5 mg tabs: use asd.  The target INR is 2 - 3.  The next INR is due 10/07/2009.  Anticoagulation instructions were given to patient.  Results were reviewed/authorized by Eda Keys, PharmD.  He was notified by Eda Keys.         Prior Anticoagulation Instructions: INR 2.1 Continue 5mg s everyday except 2.5mg s on Mondays. Recheck in 4 weeks.   Current Anticoagulation Instructions: INR 2.3  Continue current dosing schedule. Take 1/2 tablet on Monday and take 1 tablet all other days. Return 4 weeks.

## 2010-09-08 NOTE — Medication Information (Signed)
Summary: rov/ewj  Anticoagulant Therapy  Managed by: Bethena Midget, RN, BSN Referring MD: Olga Millers MD PCP: Dr. Gwen Pounds MD: Gala Romney MD, Reuel Boom Indication 1: Atrial Fibrillation (ICD-427.31) Lab Used: [facility] Site: [facility] INR POC 2.0 INR RANGE 2 - 3  Dietary changes: no    Health status changes: no    Bleeding/hemorrhagic complications: no    Recent/future hospitalizations: no    Any changes in medication regimen? no    Recent/future dental: no  Any missed doses?: no       Is patient compliant with meds? yes       Allergies: No Known Drug Allergies  Anticoagulation Management History:      The patient is taking warfarin and comes in today for a routine follow up visit.  Positive risk factors for bleeding include presence of serious comorbidities.  Negative risk factors for bleeding include an age less than 62 years old.  The bleeding index is 'intermediate risk'.  Positive CHADS2 values include History of HTN and History of Diabetes.  Negative CHADS2 values include Age > 67 years old.  The start date was 02/09/2008.  His last INR was 3.6 RATIO.  Anticoagulation responsible provider: Bensimhon MD, Reuel Boom.  INR POC: 2.0.  Cuvette Lot#: [id number].  Exp: 12/2010.    Anticoagulation Management Assessment/Plan:      The patient's current anticoagulation dose is Warfarin sodium 5 mg tabs: use asd.  The target INR is 2 - 3.  The next INR is due 12/02/2009.  Anticoagulation instructions were given to patient.  Results were reviewed/authorized by Bethena Midget, RN, BSN.  He was notified by Bethena Midget, RN, BSN.         Prior Anticoagulation Instructions: INR 2.4  Continue on same dosage 1 tablet daily except 1/2 tablet on Mondays.  Recheck in 4 weeks.    Current Anticoagulation Instructions: INR 2.0 Today take 1 1/2 pills, then resume 1 pilll everyday except 1/2 pill on Mondays. Recheck in 4 weeks.

## 2010-09-08 NOTE — Medication Information (Signed)
Summary: rov/ewj  Anticoagulant Therapy  Managed by: Weston Brass, PharmD Referring MD: Olga Millers MD PCP: Dr. Gwen Pounds MD: Gala Romney MD, Reuel Boom Indication 1: Atrial Fibrillation (ICD-427.31) Lab Used: [facility] Site: [facility] INR POC 2.5 INR RANGE 2 - 3  Dietary changes: no    Health status changes: no    Bleeding/hemorrhagic complications: no    Recent/future hospitalizations: no    Any changes in medication regimen? no    Recent/future dental: no  Any missed doses?: no       Is patient compliant with meds? yes       Allergies: No Known Drug Allergies  Anticoagulation Management History:      The patient is taking warfarin and comes in today for a routine follow up visit.  Positive risk factors for bleeding include presence of serious comorbidities.  Negative risk factors for bleeding include an age less than 66 years old.  The bleeding index is 'intermediate risk'.  Positive CHADS2 values include History of HTN and History of Diabetes.  Negative CHADS2 values include Age > 38 years old.  The start date was 02/09/2008.  His last INR was 3.6 RATIO.  Anticoagulation responsible provider: Bensimhon MD, Reuel Boom.  INR POC: 2.5.  Cuvette Lot#: [id number].  Exp: 03/2011.    Anticoagulation Management Assessment/Plan:      The patient's current anticoagulation dose is Warfarin sodium 5 mg tabs: use asd.  The target INR is 2 - 3.  The next INR is due 01/27/2010.  Anticoagulation instructions were given to patient.  Results were reviewed/authorized by Weston Brass, PharmD.  He was notified by Weston Brass PharmD.         Prior Anticoagulation Instructions: INR 2.5  Continue on same dosage 1 tablet daily except 1/2 tablet on Mondays.  Recheck in 4 weeks.    Current Anticoagulation Instructions: INR 2.5  Continue same dose of 1 tablet every day except 1/2 tablet on Monday

## 2010-09-08 NOTE — Medication Information (Signed)
Summary: rov/eac  Anticoagulant Therapy  Managed by: [name] Reams, [name] [name], [name] [name] Referring MD: Olga Millers MD PCP: Dr. Gwen Pounds MD: Clifton James MD, Cristal Deer Indication 1: Atrial Fibrillation (ICD-427.31) Lab Used: [facility] Site: [facility] INR POC 2.4 INR RANGE 2 - 3  Dietary changes: no    Health status changes: no    Bleeding/hemorrhagic complications: no    Recent/future hospitalizations: no    Any changes in medication regimen? no    Recent/future dental: no  Any missed doses?: no       Is patient compliant with meds? yes       Allergies (verified): No Known Drug Allergies  Anticoagulation Management History:      The patient is taking warfarin and comes in today for a routine follow up visit.  Positive risk factors for bleeding include presence of serious comorbidities.  Negative risk factors for bleeding include an age less than 30 years old.  The bleeding index is 'intermediate risk'.  Positive CHADS2 values include History of HTN and History of Diabetes.  Negative CHADS2 values include Age > 18 years old.  The start date was 02/09/2008.  His last INR was 3.6 RATIO.  Anticoagulation responsible provider: Clifton James MD, Cristal Deer.  INR POC: 2.4.  Cuvette Lot#: [id number].  Exp: 12/2010.    Anticoagulation Management Assessment/Plan:      The patient's current anticoagulation dose is Warfarin sodium 5 mg tabs: use asd.  The target INR is 2 - 3.  The next INR is due 11/04/2009.  Anticoagulation instructions were given to patient.  Results were reviewed/authorized by [name] Reams, [name] [name], [name] [name].  He was notified by [name] Reams [name] [name].         Prior Anticoagulation Instructions: INR 2.3  Continue current dosing schedule. Take 1/2 tablet on Monday and take 1 tablet all other days. Return 4 weeks.  Current Anticoagulation Instructions: INR 2.4  Continue on same dosage 1 tablet daily except 1/2 tablet on Mondays.  Recheck in 4 weeks.

## 2010-09-08 NOTE — Medication Information (Signed)
Summary: rov/ewj   Anticoagulant Therapy  Managed by: Cloyde Reams, RN, BSN Referring MD: Olga Millers MD PCP: Dr. Gwen Pounds MD: Kristeen Miss Indication 1: Atrial Fibrillation (ICD-427.31) Lab Used: [facility] Site: [facility] INR POC 3.1 INR RANGE 2 - 3  Dietary changes: no    Health status changes: no    Bleeding/hemorrhagic complications: no    Recent/future hospitalizations: no    Any changes in medication regimen? yes       Details: had an increase in dose in one of his medicines but can't think of the name  Recent/future dental: no  Any missed doses?: no       Is patient compliant with meds? yes       Allergies: No Known Drug Allergies  Anticoagulation Management History:      The patient is taking warfarin and comes in today for a routine follow up visit.  Positive risk factors for bleeding include presence of serious comorbidities.  Negative risk factors for bleeding include an age less than 51 years old.  The bleeding index is 'intermediate risk'.  Positive CHADS2 values include History of HTN and History of Diabetes.  Negative CHADS2 values include Age > 68 years old.  The start date was 02/09/2008.  His last INR was 3.6 RATIO.  Anticoagulation responsible Jniya Madara: Kristeen Miss.  INR POC: 3.1.  Cuvette Lot#: [id number].  Exp: 06/2011.    Anticoagulation Management Assessment/Plan:      The patient's current anticoagulation dose is Warfarin sodium 5 mg tabs: use asd.  The target INR is 2 - 3.  The next INR is due 06/30/2010.  Anticoagulation instructions were given to patient.  Results were reviewed/authorized by Cloyde Reams, RN, BSN.         Prior Anticoagulation Instructions: INR 2.0  Continue on same dosage 1 tablet daily except 1.2 tablet on Mondays.  Recheck in 4 weeks.    Current Anticoagulation Instructions: INR 3.1 Take a half tablet today instead of taking a full tablet. Then resume normal schedule of a half tablet on Mondays and a  whole tablet all other days. Recheck in 3 weeks.

## 2010-09-08 NOTE — Medication Information (Signed)
Summary: rov/ewj  Anticoagulant Therapy  Managed by: Cloyde Reams, RN, BSN Referring MD: Olga Millers MD PCP: Dr. Gwen Pounds MD: Myrtis Ser MD, Tinnie Gens Indication 1: Atrial Fibrillation (ICD-427.31) Lab Used: [facility] Site: [facility] INR POC 2.0 INR RANGE 2 - 3  Dietary changes: no    Health status changes: no    Bleeding/hemorrhagic complications: no    Recent/future hospitalizations: no    Any changes in medication regimen? yes       Details: Incr Furosemide 80 in am and 40 in pm.  Recent/future dental: no  Any missed doses?: yes     Details: Missed 1 dosage Friday pm.   Is patient compliant with meds? yes       Allergies: No Known Drug Allergies  Anticoagulation Management History:      The patient is taking warfarin and comes in today for a routine follow up visit.  Positive risk factors for bleeding include presence of serious comorbidities.  Negative risk factors for bleeding include an age less than 71 years old.  The bleeding index is 'intermediate risk'.  Positive CHADS2 values include History of HTN and History of Diabetes.  Negative CHADS2 values include Age > 60 years old.  The start date was 02/09/2008.  His last INR was 3.6 RATIO.  Anticoagulation responsible Geddy [place]: Myrtis Ser MD, Tinnie Gens.  INR POC: 2.0.  Cuvette Lot#: [id number].  Exp: 06/2011.    Anticoagulation Management Assessment/Plan:      The patient's current anticoagulation dose is Warfarin sodium 5 mg tabs: use asd.  The target INR is 2 - 3.  The next INR is due 06/09/2010.  Anticoagulation instructions were given to patient.  Results were reviewed/authorized by Cloyde Reams, RN, BSN.  He was notified by Cloyde Reams RN.         Prior Anticoagulation Instructions: INR 3.6  Skip 1 dose of Coumadin, then resume same dosage 1 tablet daily except 1/2 tablet on Mondays.  Recheck in 3 weeks.    Current Anticoagulation Instructions: INR 2.0  Continue on same dosage 1 tablet daily except 1.2 tablet  on Mondays.  Recheck in 4 weeks.

## 2010-09-08 NOTE — Assessment & Plan Note (Signed)
Summary: CPX/  [facility]  #   Vital Signs:  Patient profile:   65 year old male Height:      68 inches Weight:      289.50 pounds BMI:     44.18 O2 Sat:      93 % on Room air Temp:     97.7 degrees F oral Pulse rate:   101 / minute BP sitting:   148 / 92  (left arm) Cuff size:   large  Vitals Entered By: Zella Ball Ewing CMA Duncan Dull) (June 26, 2010 8:26 AM)  O2 Flow:  Room air  CC: Adult Physical/RE   Primary Care Brendalee Matthies:  Dr. Jonny Ruiz  CC:  Adult Physical/RE.  History of Present Illness: here for wellness;  seen recently oct 31 per vascular specialist/dr brabham, felt to have LE lymphedemda , rx LE stocking but not yet started using this;  swelling persists no change so far - no signs of cellulititis;  Pt denies CP, worsening sob, doe, wheezing, orthopnea, pnd, worsening LE edema, palps, dizziness or syncope  Pt denies new neuro symptoms such as headache, facial or extremity weakness  Pt denies polydipsia, polyuria, or low sugar symptoms such as shakiness improved with eating.  Overall good compliance with meds, trying to follow low chol, DM diet, wt stable, little excercise however No fever, wt loss, night sweats, loss of appetite or other constitutional symptoms Overall good compliance with meds, and good tolerability. Denies worsening depressive symptoms, suicidal ideation, or panic.  Pt states good ability with ADL's, low fall risk, home safety reviewed and adequate, no significant change in hearing or vision, trying to follow lower chol diet, and occasionally active only with regular excercise. BP at home usually < 140/90, though not checked recently  Preventive Screening-Counseling & Management      Drug Use:  no.    Problems Prior to Update: 1)  Loose Stools  (ICD-787.91) 2)  Hematochezia  (ICD-578.1) 3)  Coumadin Therapy  (ICD-V58.61) 4)  Skin Rash  (ICD-782.1) 5)  Cellulitis, Leg, Right  (ICD-682.6) 6)  Obesity Hypoventilation Syndrome  (ICD-278.8) 7)  Atrial Fibrillation   (ICD-427.31) 8)  Obstructive Sleep Apnea  (ICD-327.23) 9)  Preventive Health Care  (ICD-V70.0) 10)  Anticoagulation Therapy  (ICD-V58.61) 11)  Peripheral Edema  (ICD-782.3) 12)  Unspecified Hearing Loss  (ICD-389.9) 13)  Family History of Cad Male 1st Degree Relative <50  (ICD-V17.3) 14)  Family History Breast Cancer 1st Degree Relative <50  (ICD-V16.3) 15)  Common Migraine  (ICD-346.10) 16)  Bronchitis, Chronic  (ICD-491.9) 17)  Prostate Specific Antigen, Elevated  (ICD-790.93) 18)  Diabetes Mellitus, Type II  (ICD-250.00) 19)  Gout  (ICD-274.9) 20)  Overweight  (ICD-278.02) 21)  Hypertension  (ICD-401.9) 22)  Hyperlipidemia  (ICD-272.4)  Medications Prior to Update: 1)  Allopurinol 100 Mg Tabs (Allopurinol) .Marland Kitchen.. 1 Tablet By Mouth Once A Day 2)  Metoprolol Tartrate 50 Mg Tabs (Metoprolol Tartrate) .Marland Kitchen.. 1 By Mouth Two Times A Day 3)  Lisinopril 40 Mg Tabs (Lisinopril) .Marland Kitchen.. 1po Once Daily 4)  Warfarin Sodium 5 Mg Tabs (Warfarin Sodium) .... Use Asd 5)  Furosemide 80 Mg Tabs (Furosemide) .Marland Kitchen.. 1po Two Times A Day 6)  Klor-Con M20 20 Meq Tbcr (Potassium Chloride Crys Cr) .... Take 1 Tablet By Mouth Once A Day 7)  Simvastatin 40 Mg Tabs (Simvastatin) .... Take 1 Tablet By Mouth Once A Day 8)  Glimepiride 1 Mg  Tabs (Glimepiride) .Marland Kitchen.. 1 By Mouth Once Daily 9)  Onetouch Ultra Test  Strp (Glucose Blood) .... Use 1strip  Two Times A Day To Test Blood Glucose 10)  Diltiazem Hcl Er Beads 300 Mg Xr24h-Cap (Diltiazem Hcl Er Beads) .Marland Kitchen.. 1 By Mouth Once Daily  Current Medications (verified): 1)  Allopurinol 100 Mg Tabs (Allopurinol) .Marland Kitchen.. 1 Tablet By Mouth Once A Day 2)  Metoprolol Tartrate 50 Mg Tabs (Metoprolol Tartrate) .Marland Kitchen.. 1 By Mouth Two Times A Day 3)  Lisinopril 40 Mg Tabs (Lisinopril) .Marland Kitchen.. 1po Once Daily 4)  Warfarin Sodium 5 Mg Tabs (Warfarin Sodium) .... Use Asd 5)  Furosemide 80 Mg Tabs (Furosemide) .Marland Kitchen.. 1po Two Times A Day 6)  Klor-Con M20 20 Meq Tbcr (Potassium Chloride Crys Cr) ....  Take 1 Tablet By Mouth Once A Day 7)  Simvastatin 40 Mg Tabs (Simvastatin) .... Take 1 Tablet By Mouth Once A Day 8)  Glimepiride 1 Mg  Tabs (Glimepiride) .Marland Kitchen.. 1 By Mouth Once Daily 9)  Onetouch Ultra Test  Strp (Glucose Blood) .... Use 1strip  Two Times A Day To Test Blood Glucose 10)  Diltiazem Hcl Er Beads 300 Mg Xr24h-Cap (Diltiazem Hcl Er Beads) .Marland Kitchen.. 1 By Mouth Once Daily  Allergies (verified): No Known Drug Allergies  Past History:  Past Medical History: Last updated: 06/22/2010 Hyperlipidemia Hypertension Obesity Gout Diabetes mellitus, type II elevated PSA Chronic bronchitis migraine Pneumonia 2005 and 2009 afib 7/09 - dr Jens Som - s/p failed conversion x 2 - dr Johney Frame Anticoagulation therapy increased PSA - down with antibx 2008 -  dr Brunilda Payor  [facility]      - PSG 06/18/08 AHI 64      - BPAP 18/9 with 4 liters oxygen      - Dr. Coralyn Helling Obesity/Hypoventilation Syndrome      - 2 liters oxygen during the day at rest, and 4 liters with activity Chronic bronchitis      - Spirometry 04/03/09 FEV1 2.08(69%), FEV1% 77 Atrial Fibrillation  Past Surgical History: Last updated: 10/16/2008 Appendectomy 1974 Inguinal herniorrhaphy x2 s/p right hand surgury 2009 - dr Izora Ribas s/p cardioversion 05/21/08  Family History: Last updated: 05/24/2008 Family History Breast cancer 1st degree relative <50 Family History of CAD Male 1st degree relative Father with prostate cancer, alzheimer's Brother died deith probable melanoma 2023-05-20  Social History: Last updated: 06/26/2010 Former Smoker Alcohol use-yes Married 2 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 Drug use-no  Risk Factors: Smoking Status: quit (06/09/2007)  Social History: Former Smoker Alcohol use-yes 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         all otherwise negative per pt -  except for mild loose stools without lood for 6 months; has eaten more fried foods than usual, has been eating out more; no incr stress, no new meds;  no abd pain, n/v, wt loss, other blood or fever;    Physical Exam  General:  alert and overweight-appearing.   Head:  normocephalic and atraumatic.   Eyes:  vision grossly intact, pupils equal, and pupils round.   Ears:  R ear normal and L ear normal.   Nose:  no external deformity and no nasal discharge.   Mouth:  no  gingival abnormalities and pharynx pink and moist.   Neck:  supple and no masses.   Lungs:  normal respiratory effort and normal breath sounds.   Heart:  normal rate and irregular rhythm.   Abdomen:  soft, non-tender, and normal bowel sounds.   Msk:  no joint tenderness and no joint swelling.   Extremities:  no edema, no erythema  Neurologic:  cranial nerves II-XII intact and strength normal in all extremities.   Skin:  color normal and no rashes.   Psych:  not anxious appearing and not depressed appearing.     Impression & Recommendations:  Problem # 1:  Preventive Health Care (ICD-V70.0) Overall doing well, age appropriate education and counseling updated, referral for preventive services and immunizations addressed, dietary counseling and smoking status adressed , most recent labs reviewed, ecg reviewed I have personally reviewed and have noted 1.The patient's medical and social history 2.Their use of alcohol, tobacco or illicit drugs 3.Their current medications and supplements 4. Functional ability including ADL's, fall risk, home safety risk, hearing &  visual impairment  5.Diet and physical activities 6.Evidence for depression or mood disorders The patients weight, height, BMI  have been recorded in the chart I have made referrals, counseling and provided education to the patient based review of the above  Orders: EKG w/ Interpretation (93000)  Problem # 2:  ATRIAL FIBRILLATION (ICD-427.31)  His updated medication list for this problem includes:    Metoprolol Tartrate 50 Mg Tabs (Metoprolol tartrate) .Marland Kitchen... 1 by mouth two times a day    Warfarin Sodium 5 Mg Tabs (Warfarin sodium) ..... Use asd    Diltiazem Hcl Er Beads 300 Mg Xr24h-cap (Diltiazem hcl er beads) .Marland Kitchen... 1 by mouth once daily sees Dr Jerrye Beavers ; s/p failed cardioversoin x 2;  rate and volume ok, cont's on coumadin   Problem # 3:  COUMADIN THERAPY (ICD-V58.61) with very minor hemorrrhoidal bleed per pt, o/w stable, cont with coumadin clinic  Problem # 4:  HEMATOCHEZIA (ICD-578.1) as above, due for colonoscpy 2015; Hgb normal, ok to follow  Problem # 5:  LOOSE STOOLS (ICD-787.91) wihtout other symtpoms - ok for fiber supplement or align   Problem # 6:  HYPERTENSION (ICD-401.9)  His updated medication list for this problem includes:    Metoprolol Tartrate 50 Mg Tabs (Metoprolol tartrate) .Marland Kitchen... 1 by mouth two times a day    Lisinopril 40 Mg Tabs (Lisinopril) .Marland Kitchen... 1po once daily    Furosemide 80 Mg Tabs (Furosemide) .Marland Kitchen... 1po two times a day    Diltiazem Hcl Er Beads 300 Mg Xr24h-cap (Diltiazem hcl er beads) .Marland Kitchen... 1 by mouth once daily  BP today: 148/92 Prior BP: 126/84 (06/22/2010) stable overall by hx and exam, ok to continue meds/tx as is  Labs Reviewed: K+: 4.2 (06/22/2010) Creat: : 1.0 (06/22/2010)   Chol: 123 (06/22/2010)   HDL: 37.20 (06/22/2010)   LDL: 61 (06/22/2010)   TG: 124.0 (06/22/2010) chart review shows several normal and several elev BP in the past yr;  pt declines further meds today and will start checking BP at home twice per wk  Complete  Medication List: 1)  Allopurinol 100 Mg Tabs (Allopurinol) .Marland Kitchen.. 1 tablet by mouth once a day 2)  Metoprolol Tartrate 50 Mg Tabs (Metoprolol tartrate) .Marland Kitchen.. 1 by mouth two times a day 3)  Lisinopril 40 Mg Tabs (Lisinopril) .Marland Kitchen.. 1po once daily 4)  Warfarin Sodium 5 Mg Tabs (Warfarin sodium) .... Use asd 5)  Furosemide 80 Mg Tabs (Furosemide) .Marland Kitchen.. 1po  two times a day 6)  Klor-con M20 20 Meq Tbcr (Potassium chloride crys cr) .... Take 1 tablet by mouth once a day 7)  Simvastatin 40 Mg Tabs (Simvastatin) .... Take 1 tablet by mouth once a day 8)  Glimepiride 1 Mg Tabs (Glimepiride) .Marland Kitchen.. 1 by mouth once daily 9)  Onetouch Ultra Test Strp (Glucose blood) .... Use 1strip  two times a day to test blood glucose 10)  Diltiazem Hcl Er Beads 300 Mg Xr24h-cap (Diltiazem hcl er beads) .Marland Kitchen.. 1 by mouth once daily  Other Orders: Flu Vaccine 74yrs + ([address]) Admin 1st Vaccine (60454)  Patient Instructions: 1)  we were unable to give you the flu shot today;  please consider a local drug store that administers the shot 2)  Continue all previous medications as before this visit  3)  Please also take matamucil or similar fiber supplement daily 4)  Please be more active and less calorie to lose wt 5)  Please check your BP at home twice weekly and return for BP with the top number more often > 130, and lower number > 85 6)  Please keep your specialist appts as you do  - including the dermatologist, Vascular specialist, and  Dr Jens Som 7)  Please schedule a follow-up appointment in 6 months with: 8)  BMP prior to visit, ICD-9: 250.02 9)  Lipid Panel prior to visit, ICD-9: 10)  HbgA1C prior to visit, ICD-9:   Orders Added: 1)  EKG w/ Interpretation [93000] 2)  Flu Vaccine 62yrs + [90658] 3)  Admin 1st Vaccine [90471] 4)  Est. Patient 40-64 years [99396]   Immunizations Administered:  Influenza Vaccine # 1:    Vaccine Type: Fluvax 3+    Site: left deltoid    Mfr: Sanofi Pasteur    Dose: 0.5 ml    Route:  IM    Given by: Zella Ball Ewing CMA (AAMA)    Exp. Date: 02/06/2011    Lot #: [id number]    VIS given: 03/02/07 version given June 26, 2010.   Immunizations Administered:  Influenza Vaccine # 1:    Vaccine Type: Fluvax 3+    Site: left deltoid    Mfr: Sanofi Pasteur    Dose: 0.5 ml    Route: IM    Given by: Zella Ball Ewing CMA (AAMA)    Exp. Date: 02/06/2011    Lot #: [id number]    VIS given: 03/02/07 version given June 26, 2010.

## 2010-09-08 NOTE — Assessment & Plan Note (Signed)
Summary: swelling legs/blisters back of legs/cd   Vital Signs:  Patient profile:   65 year old male Height:      68 inches Weight:      285.50 pounds BMI:     43.57 O2 Sat:      91 % on Room air Temp:     97.6 degrees F oral Pulse rate:   95 / minute BP sitting:   152 / 90  (left arm) Cuff size:   large  Vitals Entered By: Zella Ball Ewing CMA (AAMA) (March 12, 2010 11:20 AM)  O2 Flow:  Room air CC: Both legs swelling and have blisters/RE   Primary Care Alban Marucci:  Dr. Jonny Ruiz  CC:  Both legs swelling and have blisters/RE.  History of Present Illness: here with family - pt has increased LE bilat edema for approx 4 -6 wks despite low salt in diet, not drinking fluids to excess and overall good med compliance.  Very sedentary, tends to sit with legs down in sitting positon most of the day;  sleeps in recliner at night;  edema does tend to decrease at night but still overall increased and now with blisters small to both legs, and one in particular "burst" to the right post calf area with more swelling and tender in the past 2 to 3 days.  Pt denies CP, sob, doe, wheezing, orthopnea, pnd, palps, dizziness or syncope.  Pt denies new neuro symptoms such as headache, facial or extremity weakness  Labs reviewed with pt from may 2011.  No overt bleeding or bruising since then, denies hyper/hypothyroid symptoms such as wt /voice/skin changes.  Has long hx of stasis dermatitis, now quite severe scaliness assoc with the swelling as well.  No recent falls or injury, remains morbid obese.   Pt denies polydipsia, polyuria, or low sugar symptoms such as shakiness improved with eating.  Overall good compliance with meds, trying to follow low chol, DM diet, wt stable, little excercise however   Problems Prior to Update: 1)  Skin Rash  (ICD-782.1) 2)  Cellulitis, Leg, Right  (ICD-682.6) 3)  Obesity Hypoventilation Syndrome  (ICD-278.8) 4)  Atrial Fibrillation  (ICD-427.31) 5)  Obstructive Sleep Apnea   (ICD-327.23) 6)  Preventive Health Care  (ICD-V70.0) 7)  Anticoagulation Therapy  (ICD-V58.61) 8)  Peripheral Edema  (ICD-782.3) 9)  Unspecified Hearing Loss  (ICD-389.9) 10)  Family History of Cad Male 1st Degree Relative <50  (ICD-V17.3) 11)  Family History Breast Cancer 1st Degree Relative <50  (ICD-V16.3) 12)  Common Migraine  (ICD-346.10) 13)  Bronchitis, Chronic  (ICD-491.9) 14)  Prostate Specific Antigen, Elevated  (ICD-790.93) 15)  Diabetes Mellitus, Type II  (ICD-250.00) 16)  Gout  (ICD-274.9) 17)  Overweight  (ICD-278.02) 18)  Hypertension  (ICD-401.9) 19)  Hyperlipidemia  (ICD-272.4)  Medications Prior to Update: 1)  Allopurinol 100 Mg Tabs (Allopurinol) .Marland Kitchen.. 1 Tablet By Mouth Once A Day 2)  Metoprolol Tartrate 50 Mg Tabs (Metoprolol Tartrate) .Marland Kitchen.. 1 By Mouth Two Times A Day 3)  Lisinopril 40 Mg Tabs (Lisinopril) .Marland Kitchen.. 1po Once Daily 4)  Cardizem La 360 Mg Xr24h-Tab (Diltiazem Hcl Coated Beads) .Marland Kitchen.. 1 By Mouth Daily 5)  Warfarin Sodium 5 Mg Tabs (Warfarin Sodium) .... Use Asd 6)  Furosemide 40 Mg Tabs (Furosemide) .... Take 1 Two Times A Day 7)  Klor-Con M20 20 Meq Tbcr (Potassium Chloride Crys Cr) .... Take 1 Tablet By Mouth Once A Day 8)  Simvastatin 40 Mg Tabs (Simvastatin) .... Take 1 Tablet By Mouth  Once A Day 9)  Glimepiride 1 Mg  Tabs (Glimepiride) .Marland Kitchen.. 1 By Mouth Once Daily 10)  Onetouch Ultra Test  Strp (Glucose Blood) .... Use 1strip  Two Times A Day To Test Blood Glucose 11)  Diltiazem Hcl Er Beads 300 Mg Xr24h-Cap (Diltiazem Hcl Er Beads) .Marland Kitchen.. 1 By Mouth Once Daily  Current Medications (verified): 1)  Allopurinol 100 Mg Tabs (Allopurinol) .Marland Kitchen.. 1 Tablet By Mouth Once A Day 2)  Metoprolol Tartrate 50 Mg Tabs (Metoprolol Tartrate) .Marland Kitchen.. 1 By Mouth Two Times A Day 3)  Lisinopril 40 Mg Tabs (Lisinopril) .Marland Kitchen.. 1po Once Daily 4)  Cardizem La 360 Mg Xr24h-Tab (Diltiazem Hcl Coated Beads) .Marland Kitchen.. 1 By Mouth Daily 5)  Warfarin Sodium 5 Mg Tabs (Warfarin Sodium) .... Use  Asd 6)  Furosemide 40 Mg Tabs (Furosemide) .Marland Kitchen.. 1 and 1/2 By Mouth Two Times A Day 7)  Klor-Con M20 20 Meq Tbcr (Potassium Chloride Crys Cr) .... Take 1 Tablet By Mouth Once A Day 8)  Simvastatin 40 Mg Tabs (Simvastatin) .... Take 1 Tablet By Mouth Once A Day 9)  Glimepiride 1 Mg  Tabs (Glimepiride) .Marland Kitchen.. 1 By Mouth Once Daily 10)  Onetouch Ultra Test  Strp (Glucose Blood) .... Use 1strip  Two Times A Day To Test Blood Glucose 11)  Diltiazem Hcl Er Beads 300 Mg Xr24h-Cap (Diltiazem Hcl Er Beads) .Marland Kitchen.. 1 By Mouth Once Daily 12)  Doxycycline Hyclate 100 Mg Caps (Doxycycline Hyclate) .Marland Kitchen.. 1po Two Times A Day  Allergies (verified): No Known Drug Allergies  Past History:  Past Medical History: Last updated: 04/03/2009 Hyperlipidemia Hypertension Obesity Gout Diabetes mellitus, type II elevated PSA Chronic bronchitis migraine Pneumonia 2005 and 2009 afib 7/09 - dr Jens Som - s/p failed conversion x 2 - dr Johney Frame Anticoagulation therapy increased PSA - down with antibx 2008 -  dr Brunilda Payor  [facility]      - PSG 06/18/08 AHI 64      - BPAP 18/9 with 2 liters oxygen      - Dr. 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   Head:  normocephalic and atraumatic.   Eyes:  vision grossly intact, pupils equal, and pupils round.   Ears:  R ear normal and L ear normal.   Nose:  no external deformity and no nasal discharge.   Mouth:  no gingival abnormalities and pharynx pink and moist.   Neck:  supple and no masses.    Lungs:  normal respiratory effort and normal breath sounds.   Heart:  normal rate and regular rhythm.   Abdomen:  soft, non-tender, and normal bowel sounds.   Extremities:  bialt 2-3+ edema to knees bilat with severe scaliness and multiple bilat skin blister like lesions without significant weeping, except for right post calf with a small 15 mm open superfic lesion with slight drainage and surrounding mild erythema and tenderness without additional swelling or fluctuance   Impression & Recommendations:  Problem # 1:  PERIPHERAL EDEMA (ICD-782.3)  His updated medication list for this problem includes:    Furosemide 40 Mg Tabs (  Furosemide) .Marland Kitchen... 1 and 1/2 by mouth two times a day ok to incr the lasix to 1.5 pills two times a day ;  recent labs reviewed with pt;  for f/u 2 wks with daily wts;  and repeat labs next visit,  leg elevation emphasized, declines compression stockings, for low salt diet to continue as well   Problem # 2:  CELLULITIS, LEG, RIGHT (ICD-682.6)  post right calf, small area for now - for doxy course  His updated medication list for this problem includes:    Doxycycline Hyclate 100 Mg Caps (Doxycycline hyclate) .Marland Kitchen... 1po two times a day  Problem # 3:  SKIN RASH (ICD-782.1)  diffuse scaliness to legs - also for derm referral  Orders: Dermatology Referral (Derma)  Problem # 4:  DIABETES MELLITUS, TYPE II (ICD-250.00)  His updated medication list for this problem includes:    Lisinopril 40 Mg Tabs (Lisinopril) .Marland Kitchen... 1po once daily    Glimepiride 1 Mg Tabs (Glimepiride) .Marland Kitchen... 1 by mouth once daily  Labs Reviewed: Creat: 0.8 (12/18/2009)    Reviewed HgBA1c results: 6.5 (12/18/2009)  6.3 (06/20/2009) stable overall by hx and exam, ok to continue meds/tx as is   Complete Medication List: 1)  Allopurinol 100 Mg Tabs (Allopurinol) .Marland Kitchen.. 1 tablet by mouth once a day 2)  Metoprolol Tartrate 50 Mg Tabs (Metoprolol tartrate) .Marland Kitchen.. 1 by mouth two times a day 3)   Lisinopril 40 Mg Tabs (Lisinopril) .Marland Kitchen.. 1po once daily 4)  Cardizem La 360 Mg Xr24h-tab (Diltiazem hcl coated beads) .Marland Kitchen.. 1 by mouth daily 5)  Warfarin Sodium 5 Mg Tabs (Warfarin sodium) .... Use asd 6)  Furosemide 40 Mg Tabs (Furosemide) .Marland Kitchen.. 1 and 1/2 by mouth two times a day 7)  Klor-con M20 20 Meq Tbcr (Potassium chloride crys cr) .... Take 1 tablet by mouth once a day 8)  Simvastatin 40 Mg Tabs (Simvastatin) .... Take 1 tablet by mouth once a day 9)  Glimepiride 1 Mg Tabs (Glimepiride) .Marland Kitchen.. 1 by mouth once daily 10)  Onetouch Ultra Test Strp (Glucose blood) .... Use 1strip  two times a day to test blood glucose 11)  Diltiazem Hcl Er Beads 300 Mg Xr24h-cap (Diltiazem hcl er beads) .Marland Kitchen.. 1 by mouth once daily 12)  Doxycycline Hyclate 100 Mg Caps (Doxycycline hyclate) .Marland Kitchen.. 1po two times a day  Patient Instructions: 1)  increase the furosemide to 1 and 1/2 pills two times a day  2)  Please take all new medications as prescribed  - the doxycycline antibiotic 3)  Please avoid salt in your diet or excess fluids, as well as keep your legs elevated as much as is practical 4)  You will be contacted about the referral(s) to: dermatology 5)  Please schedule a follow-up appointment in 2 weeks. Prescriptions: FUROSEMIDE 40 MG TABS (FUROSEMIDE) 1 and 1/2 by mouth two times a day  #90 x 11   Entered and Authorized by:   Corwin Levins MD   Signed by:   Corwin Levins MD on 03/12/2010   Method used:   Electronically to        [facility]* (retail)       [address]       [place], [place]  [address]       Ph: [phone] or [phone]       Fax: [phone]   RxID:   [id number] DOXYCYCLINE HYCLATE 100 MG CAPS (DOXYCYCLINE HYCLATE) 1po two times a day  #20 x 0   Entered  and Authorized by:   Corwin Levins MD   Signed by:   Corwin Levins MD on 03/12/2010   Method used:   Electronically to        [facility]* (retail)       [address]       [place], [place]  [address]        Ph: [phone] or [phone]       Fax: [phone]   RxID:   [id number]

## 2010-09-08 NOTE — Letter (Signed)
Summary: [facility]  [facility]   Imported By: Sherian Rein 06/19/2010 07:33:11  _____________________________________________________________________  External Attachment:    Type:   Image     Comment:   External Document

## 2010-09-08 NOTE — Procedures (Signed)
Summary: Oximetry / [facility]  [facility]ximetry / [facility]   Imported By: Lennie Odor 05/04/2010 11:40:45  _____________________________________________________________________  External Attachment:    Type:   Image     Comment:   External Document

## 2010-09-08 NOTE — Medication Information (Signed)
Summary: [name] [name]  Anticoagulant Therapy  Managed by: Cloyde Reams, RN, BSN Referring MD: Olga Millers MD PCP: Dr. Gwen Pounds MD: Tenny Craw MD, Gunnar Fusi Indication 1: Atrial Fibrillation (ICD-427.31) Lab Used: [facility] Site: [facility] INR POC 3.6 INR RANGE 2 - 3  Dietary changes: no    Health status changes: no    Bleeding/hemorrhagic complications: no    Recent/future hospitalizations: no    Any changes in medication regimen? no    Recent/future dental: no  Any missed doses?: no       Is patient compliant with meds? yes       Allergies: No Known Drug Allergies  Anticoagulation Management History:      The patient is taking warfarin and comes in today for a routine follow up visit.  Positive risk factors for bleeding include presence of serious comorbidities.  Negative risk factors for bleeding include an age less than 38 years old.  The bleeding index is 'intermediate risk'.  Positive CHADS2 values include History of HTN and History of Diabetes.  Negative CHADS2 values include Age > 70 years old.  The start date was 02/09/2008.  His last INR was 3.6 RATIO.  Anticoagulation responsible provider: Tenny Craw MD, Gunnar Fusi.  INR POC: 3.6.  Cuvette Lot#: [id number].  Exp: 06/2011.    Anticoagulation Management Assessment/Plan:      The patient's current anticoagulation dose is Warfarin sodium 5 mg tabs: use asd.  The target INR is 2 - 3.  The next INR is due 05/08/2010.  Anticoagulation instructions were given to patient.  Results were reviewed/authorized by Cloyde Reams, RN, BSN.  He was notified by Cloyde Reams RN.         Prior Anticoagulation Instructions: INR 2.7  Continue taking 1 tablet (5mg ) every day except take 1/2 tablet (2.5mg ) on Mondays.  Recheck in 4 weeks.   Current Anticoagulation Instructions: INR 3.6  Skip 1 dose of Coumadin, then resume same dosage 1 tablet daily except 1/2 tablet on Mondays.  Recheck in 3 weeks.

## 2010-09-08 NOTE — Progress Notes (Signed)
  Phone Note Refill Request Message from:  Patient on March 12, 2010 1:21 PM  Refills Requested: Medication #1:  DOXYCYCLINE HYCLATE 100 MG CAPS 1po two times a day.   Dosage confirmed as above?Dosage Confirmed  Medication #2:  FUROSEMIDE 40 MG TABS 1 and 1/2 by mouth two times a day   Dosage confirmed as above?Dosage Confirmed Initial call taken by: Robin Ewing CMA (AAMA),  March 12, 2010 1:22 PM    Prescriptions: DOXYCYCLINE HYCLATE 100 MG CAPS (DOXYCYCLINE HYCLATE) 1po two times a day  #20 x 0   Entered by:   Scharlene Gloss CMA (AAMA)   Authorized by:   Corwin Levins MD   Signed by:   Scharlene Gloss CMA (AAMA) on 03/12/2010   Method used:   Faxed to ...       [facility] [address] (retail)       [address] [place], [place]  [address]       Ph: [phone]       Fax: [phone]   RxID:   [id number] FUROSEMIDE 40 MG TABS (FUROSEMIDE) 1 and 1/2 by mouth two times a day  #90 x 11   Entered by:   Zella Ball Ewing CMA (AAMA)   Authorized by:   Corwin Levins MD   Signed by:   Scharlene Gloss CMA (AAMA) on 03/12/2010   Method used:   Faxed to ...       [facility] [address] (retail)       [address] [place], [place]  [address]       Ph: [phone]       Fax: [phone]   RxID:   [id number]

## 2010-09-08 NOTE — Miscellaneous (Signed)
Summary: Overnight oximetry with BPAP and 4 liters oxygen.   Clinical Lists Changes Test time 9hrs 4 min.  Mean SpO2 89%, low 74%.  Spent 1hr 54 min (21%) with SpO2 < 88%.  Will get BPAP download and decide if he needs pressure adjusted or increased supplemental oxygen. Orders: Added new Referral order of DME Referral (DME) - Signed

## 2010-09-08 NOTE — Assessment & Plan Note (Signed)
Summary: 3 mo follow-up/LC   Visit Type:  Follow-up Copy to:  Dr. Hillis Range Primary Provider/Referring Provider:  Dr. Jonny Ruiz  CC:  OSA...OHS...doing well on new BPAP machine...patient is not wearing oxygen on a regular basis with exertion.  History of Present Illness:  I met [name] [name] in follow up for his severe OSA, OHS, and chronic bronchitis with history of tobacco abuse.  His breathing has been doing okay.  He is not having much cough, sputum, or wheeze.  He denies fever or hemopytsis.  He recently got a new VPAP machine.  He has been sleeping better since he got this.  He uses a full face mask.  He does get occasional dryness, but not too bad.  He has not had too much trouble with mask leak.  He is using his machine for about 8 hours.  He uses his oxygen at night, but has not been using this consistently during the day.  Current Medications (verified): 1)  Allopurinol 100 Mg Tabs (Allopurinol) .Marland Kitchen.. 1 Tablet By Mouth Once A Day 2)  Metoprolol Tartrate 50 Mg Tabs (Metoprolol Tartrate) .Marland Kitchen.. 1 By Mouth Two Times A Day 3)  Lisinopril 40 Mg Tabs (Lisinopril) .Marland Kitchen.. 1po Once Daily 4)  Warfarin Sodium 5 Mg Tabs (Warfarin Sodium) .... Use Asd 5)  Furosemide 80 Mg Tabs (Furosemide) .Marland Kitchen.. 1po Two Times A Day 6)  Klor-Con M20 20 Meq Tbcr (Potassium Chloride Crys Cr) .... Take 1 Tablet By Mouth Once A Day 7)  Simvastatin 40 Mg Tabs (Simvastatin) .... Take 1 Tablet By Mouth Once A Day 8)  Glimepiride 1 Mg  Tabs (Glimepiride) .Marland Kitchen.. 1 By Mouth Once Daily 9)  Onetouch Ultra Test  Strp (Glucose Blood) .... Use 1strip  Two Times A Day To Test Blood Glucose 10)  Diltiazem Hcl Er Beads 300 Mg Xr24h-Cap (Diltiazem Hcl Er Beads) .Marland Kitchen.. 1 By Mouth Once Daily  Allergies (verified): No Known Drug Allergies  Past History:  Past Medical History: Hyperlipidemia Hypertension Obesity Gout Diabetes mellitus, type II elevated PSA Chronic bronchitis migraine Pneumonia 2005 and 2009 afib 7/09 - dr Jens Som -  s/p failed conversion x 2 - dr Johney Frame Anticoagulation therapy increased PSA - down with antibx 2008 -  dr Brunilda Payor  OSA      - PSG 06/18/08 AHI 64      - BPAP 18/9 with 4 liters oxygen      - Dr. Coralyn Helling Obesity/Hypoventilation Syndrome      - 2 liters oxygen during the day at rest, and 4 liters with activity Chronic bronchitis      - Spirometry 04/03/09 FEV1 2.08(69%), FEV1% 77 Atrial Fibrillation  Past Surgical History: Reviewed history from 10/16/2008 and no changes required. Appendectomy 1974 Inguinal herniorrhaphy x2 s/p right hand surgury 2009 - dr Izora Ribas s/p cardioversion 05/21/08  Vital Signs:  Patient profile:   65 year old male Height:      68 inches (172.72 cm) Weight:      285.25 pounds (129.66 kg) BMI:     43.53 O2 Sat:      85 % on Room air Temp:     98.3 degrees F (36.83 degrees C) oral Pulse rate:   70 / minute BP sitting:   126 / 84  (left arm) Cuff size:   large  Vitals Entered By: Michel Bickers CMA (June 22, 2010 2:36 PM)  O2 Sat at Rest %:  85 O2 Flow:  Room air  O2 Sat Comments After resting 3-4 minutes the  patients sats came up to 94% on room air and pulse was 82.Michel Bickers [facility]  June 22, 2010 2:46 PM  Serial Vital Signs/Assessments:  Comments: Ambulatory Pulse Oximetry  Resting; HR__51___    02 Sat__98%2 liters___  Lap1 (185 feet)   HR__108___   02 Sat__92%2 liter___ Lap2 (185 feet)   HR__101___   02 Sat__87% 2 liter___    Lap3 (185 feet)   HR_____   02 Sat_____  ___Test Completed without Difficulty _x__Test Stopped due to: pt SOB and desat to 87% 2liters, pt recovered to 96% 2 liters at the end of the walk. Zackery Barefoot CMA  June 22, 2010 3:35 PM    By: Zackery Barefoot CMA   CC: OSA...OHS...doing well on new BPAP machine...patient is not wearing oxygen on a regular basis with exertion Comments Medications reviewed with patient Michel Bickers CMA  June 22, 2010 2:48 PM   Physical Exam  General:  obese.   Nose:  no  deformity, discharge, inflammation, or lesions Mouth:  MP 3, 2+ tonsills, no oral lesion Neck:  no JVD.   Lungs:  diminished breath sounds, clear bilaterally to auscultation and percussion Heart:  regular rate and rhythm, S1, S2 without murmurs, rubs, gallops, or clicks Extremities:  1+ non-pitting edema, chronic venous stasis changes Neurologic:  normal CN II-XII and strength normal.   Cervical Nodes:  no significant adenopathy Psych:  alert and cooperative; normal mood and affect; normal attention span and concentration   Impression & Recommendations:  Problem # 1:  OBSTRUCTIVE SLEEP APNEA (ICD-327.23)  He has done better since he got his new machine.  Will get a copy of his download.    Problem # 2:  OBESITY HYPOVENTILATION SYNDROME (ICD-278.8)  He is to continue on 2 liters oxygen during the day at rest, and 4 liters with exertion and at night.  I explained the importance of continuing to use his oxygen on a consistent basis.  Complete Medication List: 1)  Allopurinol 100 Mg Tabs (Allopurinol) .Marland Kitchen.. 1 tablet by mouth once a day 2)  Metoprolol Tartrate 50 Mg Tabs (Metoprolol tartrate) .Marland Kitchen.. 1 by mouth two times a day 3)  Lisinopril 40 Mg Tabs (Lisinopril) .Marland Kitchen.. 1po once daily 4)  Warfarin Sodium 5 Mg Tabs (Warfarin sodium) .... Use asd 5)  Furosemide 80 Mg Tabs (Furosemide) .Marland Kitchen.. 1po two times a day 6)  Klor-con M20 20 Meq Tbcr (Potassium chloride crys cr) .... Take 1 tablet by mouth once a day 7)  Simvastatin 40 Mg Tabs (Simvastatin) .... Take 1 tablet by mouth once a day 8)  Glimepiride 1 Mg Tabs (Glimepiride) .Marland Kitchen.. 1 by mouth once daily 9)  Onetouch Ultra Test Strp (Glucose blood) .... Use 1strip  two times a day to test blood glucose 10)  Diltiazem Hcl Er Beads 300 Mg Xr24h-cap (Diltiazem hcl er beads) .Marland Kitchen.. 1 by mouth once daily  Other Orders: Est. Patient Level III ([address]109) DME Referral (DME) DME Referral (DME)  Patient Instructions: 1)  Will get a copy of VPAP report 2)   Continue 2 liters oxygen during day at rest, and 4 liters with activity and at night with VPAP 3)  Follow up in 6 months

## 2010-09-08 NOTE — Medication Information (Signed)
Summary: rov/ln   Anticoagulant Therapy  Managed by: Weston Brass, PharmD Referring MD: Olga Millers MD PCP: Dr. Gwen Pounds MD: Tenny Craw MD, Gunnar Fusi Indication 1: Atrial Fibrillation (ICD-427.31) Lab Used: [facility] Site: [facility] INR POC 2.7 INR RANGE 2 - 3  Dietary changes: no    Health status changes: yes       Details: Has infections in legs and is taking an abx he does not know the name of.  He takes it twice daily x 10d which he started on Monday.  He sees Dr. Jonny Ruiz next week for a f/u.  Bleeding/hemorrhagic complications: no    Recent/future hospitalizations: no    Any changes in medication regimen? no    Recent/future dental: no  Any missed doses?: no       Is patient compliant with meds? yes       Allergies: No Known Drug Allergies  Anticoagulation Management History:      The patient is taking warfarin and comes in today for a routine follow up visit.  Positive risk factors for bleeding include presence of serious comorbidities.  Negative risk factors for bleeding include an age less than 69 years old.  The bleeding index is 'intermediate risk'.  Positive CHADS2 values include History of HTN and History of Diabetes.  Negative CHADS2 values include Age > 32 years old.  The start date was 02/09/2008.  His last INR was 3.6 RATIO.  Anticoagulation responsible provider: Tenny Craw MD, Gunnar Fusi.  INR POC: 2.7.  Cuvette Lot#: [id number].  Exp: 09/12.    Anticoagulation Management Assessment/Plan:      The patient's current anticoagulation dose is Warfarin sodium 5 mg tabs: use asd.  The target INR is 2 - 3.  The next INR is due 04/17/2010.  Anticoagulation instructions were given to patient.  Results were reviewed/authorized by Weston Brass, PharmD.  He was notified by Gweneth Fritter, PharmD Candidate.         Prior Anticoagulation Instructions: INR 2.8  Continue same dose of 1/2 tab on Monday and 1 tab all other days.  Re-check in 4 weeks.  Current Anticoagulation  Instructions: INR 2.7  Continue taking 1 tablet (5mg ) every day except take 1/2 tablet (2.5mg ) on Mondays.  Recheck in 4 weeks.

## 2010-09-08 NOTE — Progress Notes (Signed)
Summary: needs portable o2 - LMTCB x 1-returned call  Phone Note Call from Patient Call back at Home Phone [phone]   Caller: daughter-kathy mccomb Call For: sood Summary of Call: pt is needing a portable oxygen tank do to all the traveling he does [facility] Initial call taken by: Lacinda Axon,  April 01, 2010 12:10 PM  Follow-up for Phone Call        Pt last saw VS 8.26.10 -  needs OV.  [facility] Crystal Jones RN  April 01, 2010 12:18 PM  Pt's daughter Lenor Derrick returned call and i scheduled an ov w/ VS for this fri at 3:30. Tivis Ringer, CNA  April 01, 2010 12:30 PM

## 2010-09-08 NOTE — Medication Information (Signed)
Summary: rov/mw   Anticoagulant Therapy  Managed by: Weston Brass, PharmD Referring MD: Olga Millers MD PCP: Dr. Gwen Pounds MD: Shirlee Latch MD, Freida Busman Indication 1: Atrial Fibrillation (ICD-427.31) Lab Used: [facility] Site: [facility] INR POC 2.7 INR RANGE 2 - 3  Dietary changes: yes       Details: Not staying consistent with greens  Health status changes: yes       Details: SOB, but uses O2 QHS  Bleeding/hemorrhagic complications: no    Recent/future hospitalizations: no    Any changes in medication regimen? no    Recent/future dental: no  Any missed doses?: no       Is patient compliant with meds? yes       Allergies: No Known Drug Allergies  Anticoagulation Management History:      The patient is taking warfarin and comes in today for a routine follow up visit.  Positive risk factors for bleeding include history of GI bleeding and presence of serious comorbidities.  Negative risk factors for bleeding include an age less than 64 years old.  The bleeding index is 'intermediate risk'.  Positive CHADS2 values include History of HTN and History of Diabetes.  Negative CHADS2 values include Age > 69 years old.  The start date was 02/09/2008.  His last INR was 3.6 RATIO.  Anticoagulation responsible Parilee Hally: Shirlee Latch MD, [place].  INR POC: 2.7.  Cuvette Lot#: [id number].  Exp: 07/2011.    Anticoagulation Management Assessment/Plan:      The patient's current anticoagulation dose is Warfarin sodium 5 mg tabs: use asd.  The target INR is 2 - 3.  The next INR is due 07/28/2010.  Anticoagulation instructions were given to patient.  Results were reviewed/authorized by Weston Brass, PharmD.  He was notified by Hoy Register, PharmD Candidate.         Prior Anticoagulation Instructions: INR 3.1 Take a half tablet today instead of taking a full tablet. Then resume normal schedule of a half tablet on Mondays and a whole tablet all other days. Recheck in 3 weeks.   Current Anticoagulation  Instructions: INR 2.7 Continue previous dose of 1 tablet everyday except 0.5 tablet on Monday Recheck INR in 4 weeks

## 2010-09-08 NOTE — Medication Information (Signed)
Summary: ccr  Anticoagulant Therapy  Managed by: Weston Brass, PharmD Referring MD: Olga Millers MD PCP: Dr. Gwen Pounds MD: Riley Kill MD, Maisie Fus Indication 1: Atrial Fibrillation (ICD-427.31) Lab Used: [facility] Site: [facility] INR POC 2.8 INR RANGE 2 - 3  Dietary changes: no    Health status changes: no    Bleeding/hemorrhagic complications: no    Recent/future hospitalizations: no    Any changes in medication regimen? no    Recent/future dental: no  Any missed doses?: no       Is patient compliant with meds? yes       Allergies: No Known Drug Allergies  Anticoagulation Management History:      The patient is taking warfarin and comes in today for a routine follow up visit.  Positive risk factors for bleeding include presence of serious comorbidities.  Negative risk factors for bleeding include an age less than 26 years old.  The bleeding index is 'intermediate risk'.  Positive CHADS2 values include History of HTN and History of Diabetes.  Negative CHADS2 values include Age > 67 years old.  The start date was 02/09/2008.  His last INR was 3.6 RATIO.  Anticoagulation responsible provider: Riley Kill MD, Maisie Fus.  INR POC: 2.8.  Cuvette Lot#: [id number].  Exp: 09/12.    Anticoagulation Management Assessment/Plan:      The patient's current anticoagulation dose is Warfarin sodium 5 mg tabs: use asd.  The target INR is 2 - 3.  The next INR is due 03/20/2010.  Anticoagulation instructions were given to patient.  Results were reviewed/authorized by Weston Brass, PharmD.  He was notified by Dillard Cannon.         Prior Anticoagulation Instructions: INR 2.5  Continue same dose of 1 tablet every day except 1/2 tablet on Monday  Current Anticoagulation Instructions: INR 2.8  Continue same dose of 1/2 tab on Monday and 1 tab all other days.  Re-check in 4 weeks.

## 2010-09-08 NOTE — Miscellaneous (Signed)
Summary: Overnight oximetry using BPAP 18/9 and 2 liters oxygen   Clinical Lists Changes Test time 8hrs 50 min.  Mean SpO2 85%, low 68%.  Spent 6hrs 29 min (73%) with SpO2 < 88%.  Will have my nurse call to inform patient that oxygen level was still low even with oxygen and BPAP.  Will increase oxygen to 4 liters at night with BPAP and repeat ONO.  He is to continue using 2 liters oxygen with exertion during the day. Orders: Added new Referral order of DME Referral (DME) - Signed  Appended Document: Overnight oximetry using BPAP 18/9 and 2 liters oxygen LMOMTCB.  Appended Document: Overnight oximetry using BPAP 18/9 and 2 liters oxygen [name] is aware and will relay Dr. Silverio Lay instructions for pt's oxygen use with BPAP and with exertion. She had verbal understanding of all instructions. Pt was sch for f/u on 06/22/2010 w/ Dr. Craige Cotta.

## 2010-09-08 NOTE — Assessment & Plan Note (Signed)
Summary: rov/jml    Referring Provider:  Dr. Hillis [name] Primary Provider:  Dr. Jonny [name]   History of Present Illness: [name] [name] is a pleasant  gentleman who has a history of hypertension, hyperlipidemia, diabetes and now permanent atrial fibrillation.  He has undergone attempted cardioversion previously, but he did not hold sinus rhythm.  His most recent Myoview on March 08, 2008 showed an ejection fraction of 54% and normal perfusion.  A previous echocardiogram in July 2009 showed normal LV function, mild RVE, and mild right atrial enlargement.  I last saw him in March of 2010. Since then he has dyspnea with more moderate activities but not with routine activities. It is relieved with rest. It is not associated with chest pain. There is no orthopnea, PND, paations, syncope or chest pain. He has chronic pedal edema which is unchanged. There's been no bleeding.   Current Medications (verified): 1)  Allopurinol 100 Mg Tabs (Allopurinol) .Marland Kitchen.. 1 Tablet By Mouth Once A Day 2)  Metoprolol Tartrate 50 Mg Tabs (Metoprolol Tartrate) .Marland Kitchen.. 1 By Mouth Two Times A Day 3)  Lisinopril 20 Mg Tabs (Lisinopril) .Marland Kitchen.. 1 By Mouth Once Daily 4)  Cardizem La 360 Mg Xr24h-Tab (Diltiazem Hcl Coated Beads) .Marland Kitchen.. 1 By Mouth Daily 5)  Warfarin Sodium 5 Mg Tabs (Warfarin Sodium) .... Use Asd 6)  Furosemide 40 Mg Tabs (Furosemide) .... Take 1 Two Times A Day 7)  Klor-Con M20 20 Meq Tbcr (Potassium Chloride Crys Cr) .... Take 1 Tablet By Mouth Once A Day 8)  Simvastatin 40 Mg Tabs (Simvastatin) .... Take 1 Tablet By Mouth Once A Day 9)  Glimepiride 1 Mg  Tabs (Glimepiride) .Marland Kitchen.. 1 and 1/2 By Mouth Qam 10)  Onetouch Ultra Test  Strp (Glucose Blood) .... Use 1strip  Two Times A Day To Test Blood Glucose  Allergies: No Known Drug Allergies  Past History:  Past Medical History: Reviewed history from 04/03/2009 and no changes required. Hyperlipidemia Hypertension Obesity Gout Diabetes mellitus, type II elevated  PSA Chronic bronchitis migraine Pneumonia 2005 and 2009 afib 7/09 - [name] [name] - s/p failed conversion x 2 - [name] [name] Anticoagulation therapy increased PSA - down with antibx 2008 -  [name] [name]  OSA      - PSG 06/18/08 AHI 64      - BPAP 18/9 with 2 liters oxygen      - [name] [name] Obesity/Hypoventilation Syndrome Atrial Fibrillation  Past Surgical History: Reviewed history from 10/16/2008 and no changes required. Appendectomy 1974 Inguinal herniorrhaphy x2 s/p right hand surgury 2009 - [name] [name] s/p cardioversion 05/21/08  Social History: Reviewed history from 05/24/2008 and no changes required. Former Smoker Alcohol use-yes Married 2 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  Review of Systems       no fevers or chills, productive cough, hemoptysis, dysphasia, odynophagia, melena, hematochezia, dysuria, hematuria, rash, seizure activity, orthopnea, PND,  claudication. Remaining systems are negative.   Vital Signs:  Patient profile:   65 year old male Height:      68 inches Weight:      287 pounds BMI:     43.80 Pulse rate:   60 / minute Resp:     12 per minute BP sitting:   120 / 73  (left arm)  Vitals Entered By: [name] [name] (August 25, 2009 3:50 PM)  Physical Exam  General:  Well-developed obese in no acute distress.  Skin is warm and dry.  HEENT is normal.  Neck is supple. No  thyromegaly.  Chest is clear to auscultation with normal expansion.  Cardiovascular exam is irregular Abdominal exam nontender or distended. No masses palpated. Extremities show severe chronic skin changes as well to 1+ edema. neuro grossly intact    EKG  Procedure date:  08/25/2009  Findings:      atrial fibrillation at a rate of 60. Right axis deviation. Nonspecific ST changes.  Impression & Recommendations:  Problem # 1:  ATRIAL FIBRILLATION (ICD-427.31) Patient remains in permanent atrial fibrillation. Continue beta blocker and calcium blocker for rate  control. Continue Coumadin with goal INR 2-3. His updated medication list for this problem includes:    Metoprolol Tartrate 50 Mg Tabs (Metoprolol tartrate) .Marland Kitchen... 1 by mouth two times a day    Warfarin Sodium 5 Mg Tabs (Warfarin sodium) ..... Use asd  Problem # 2:  OBSTRUCTIVE SLEEP APNEA (ICD-327.23)  Problem # 3:  ANTICOAGULATION THERAPY (ICD-V58.61) Monitored in the Coumadin clinic. Her INR at 2-3.  Problem # 4:  PERIPHERAL EDEMA (ICD-782.3) Continue present dose of diuretics.  Problem # 5:  DIABETES MELLITUS, TYPE II (ICD-250.00)  His updated medication list for this problem includes:    Lisinopril 20 Mg Tabs (Lisinopril) .Marland Kitchen... 1 by mouth once daily    Glimepiride 1 Mg Tabs (Glimepiride) .Marland Kitchen... 1 and 1/2 by mouth qam  Problem # 6:  HYPERTENSION (ICD-401.9) Blood pressure controlled on present medications. Will continue. His updated medication list for this problem includes:    Metoprolol Tartrate 50 Mg Tabs (Metoprolol tartrate) .Marland Kitchen... 1 by mouth two times a day    Lisinopril 20 Mg Tabs (Lisinopril) .Marland Kitchen... 1 by mouth once daily    Cardizem La 360 Mg Xr24h-tab (Diltiazem hcl coated beads) .Marland Kitchen... 1 by mouth daily    Furosemide 40 Mg Tabs (Furosemide) .Marland Kitchen... Take 1 two times a day  Problem # 7:  HYPERLIPIDEMIA (ICD-272.4) Continue statin. His updated medication list for this problem includes:    Simvastatin 40 Mg Tabs (Simvastatin) .Marland Kitchen... Take 1 tablet by mouth once a day  Problem # 8:  OVERWEIGHT (ICD-278.02) Extensive counseling for weight loss.  Problem # 9:  GOUT (ICD-274.9)  His updated medication list for this problem includes:    Allopurinol 100 Mg Tabs (Allopurinol) .Marland Kitchen... 1 tablet by mouth once a day  Patient Instructions: 1)  Your physician recommends that you schedule a follow-up appointment in: ONE YEAR

## 2010-09-08 NOTE — Procedures (Signed)
Summary: Oximetry/[facility]  Oximetry/[facility]   Imported By: Sherian Rein 05/11/2010 08:29:47  _____________________________________________________________________  External Attachment:    Type:   Image     Comment:   External Document

## 2010-09-08 NOTE — Assessment & Plan Note (Signed)
Summary: 2 wk f/u /#cd   Vital Signs:  Patient profile:   65 year old male Height:      68 inches Weight:      283.38 pounds BMI:     43.24 O2 Sat:      93 % on Room air Temp:     98.7 degrees F oral Pulse rate:   80 / minute BP sitting:   124 / 84  (left arm) Cuff size:   large  Vitals Entered By: Zella Ball Ewing CMA Duncan Dull) (March 26, 2010 11:32 AM)  O2 Flow:  Room air CC: 2 week followup/RE   Primary Care Provider:  Dr. Jonny Ruiz  CC:  2 week followup/RE.  History of Present Illness: here to f/u; tolerated the lasix quite nicely with several lbs wt loss , and leg sweling significantly improved but only about half;  the open lesion and infectious are to the post right calf is resolved;  Pt denies CP, worsening sob, doe, wheezing, orthopnea, pnd, worsening LE edema, palps, dizziness or syncope  Pt denies new neuro symptoms such as headache, facial or extremity weakness  Pt denies polydipsia, polyuria, or low sugar symptoms such as shakiness improved with eating.  Overall good compliance with meds, trying to follow low chol, DM diet, wt stable, little excercise however  No fever, wt loss, night sweats, loss of appetite or other constitutional symptoms   Problems Prior to Update: 1)  Skin Rash  (ICD-782.1) 2)  Cellulitis, Leg, Right  (ICD-682.6) 3)  Obesity Hypoventilation Syndrome  (ICD-278.8) 4)  Atrial Fibrillation  (ICD-427.31) 5)  Obstructive Sleep Apnea  (ICD-327.23) 6)  Preventive Health Care  (ICD-V70.0) 7)  Anticoagulation Therapy  (ICD-V58.61) 8)  Peripheral Edema  (ICD-782.3) 9)  Unspecified Hearing Loss  (ICD-389.9) 10)  Family History of Cad Male 1st Degree Relative <50  (ICD-V17.3) 11)  Family History Breast Cancer 1st Degree Relative <50  (ICD-V16.3) 12)  Common Migraine  (ICD-346.10) 13)  Bronchitis, Chronic  (ICD-491.9) 14)  Prostate Specific Antigen, Elevated  (ICD-790.93) 15)  Diabetes Mellitus, Type II  (ICD-250.00) 16)  Gout  (ICD-274.9) 17)  Overweight   (ICD-278.02) 18)  Hypertension  (ICD-401.9) 19)  Hyperlipidemia  (ICD-272.4)  Medications Prior to Update: 1)  Allopurinol 100 Mg Tabs (Allopurinol) .Marland Kitchen.. 1 Tablet By Mouth Once A Day 2)  Metoprolol Tartrate 50 Mg Tabs (Metoprolol Tartrate) .Marland Kitchen.. 1 By Mouth Two Times A Day 3)  Lisinopril 40 Mg Tabs (Lisinopril) .Marland Kitchen.. 1po Once Daily 4)  Cardizem La 360 Mg Xr24h-Tab (Diltiazem Hcl Coated Beads) .Marland Kitchen.. 1 By Mouth Daily 5)  Warfarin Sodium 5 Mg Tabs (Warfarin Sodium) .... Use Asd 6)  Furosemide 40 Mg Tabs (Furosemide) .Marland Kitchen.. 1 and 1/2 By Mouth Two Times A Day 7)  Klor-Con M20 20 Meq Tbcr (Potassium Chloride Crys Cr) .... Take 1 Tablet By Mouth Once A Day 8)  Simvastatin 40 Mg Tabs (Simvastatin) .... Take 1 Tablet By Mouth Once A Day 9)  Glimepiride 1 Mg  Tabs (Glimepiride) .Marland Kitchen.. 1 By Mouth Once Daily 10)  Onetouch Ultra Test  Strp (Glucose Blood) .... Use 1strip  Two Times A Day To Test Blood Glucose 11)  Diltiazem Hcl Er Beads 300 Mg Xr24h-Cap (Diltiazem Hcl Er Beads) .Marland Kitchen.. 1 By Mouth Once Daily 12)  Doxycycline Hyclate 100 Mg Caps (Doxycycline Hyclate) .Marland Kitchen.. 1po Two Times A Day  Current Medications (verified): 1)  Allopurinol 100 Mg Tabs (Allopurinol) .Marland Kitchen.. 1 Tablet By Mouth Once A Day 2)  Metoprolol Tartrate 50  Mg Tabs (Metoprolol Tartrate) .Marland Kitchen.. 1 By Mouth Two Times A Day 3)  Lisinopril 40 Mg Tabs (Lisinopril) .Marland Kitchen.. 1po Once Daily 4)  Cardizem La 360 Mg Xr24h-Tab (Diltiazem Hcl Coated Beads) .Marland Kitchen.. 1 By Mouth Daily 5)  Warfarin Sodium 5 Mg Tabs (Warfarin Sodium) .... Use Asd 6)  Furosemide 80 Mg Tabs (Furosemide) .Marland Kitchen.. 1po Two Times A Day 7)  Klor-Con M20 20 Meq Tbcr (Potassium Chloride Crys Cr) .... Take 1 Tablet By Mouth Once A Day 8)  Simvastatin 40 Mg Tabs (Simvastatin) .... Take 1 Tablet By Mouth Once A Day 9)  Glimepiride 1 Mg  Tabs (Glimepiride) .Marland Kitchen.. 1 By Mouth Once Daily 10)  Onetouch Ultra Test  Strp (Glucose Blood) .... Use 1strip  Two Times A Day To Test Blood Glucose 11)  Diltiazem Hcl Er  Beads 300 Mg Xr24h-Cap (Diltiazem Hcl Er Beads) .Marland Kitchen.. 1 By Mouth Once Daily 12)  Doxycycline Hyclate 100 Mg Caps (Doxycycline Hyclate) .Marland Kitchen.. 1po Two Times A Day  Allergies (verified): No Known Drug Allergies  Past History:  Past Medical History: Last updated: 04/03/2009 Hyperlipidemia Hypertension Obesity Gout Diabetes mellitus, type II elevated PSA Chronic bronchitis migraine Pneumonia 2005 and 2009 afib 7/09 - dr Jens Som - s/p failed conversion x 2 - dr Johney Frame Anticoagulation therapy increased PSA - down with antibx 2008 -  dr Brunilda Payor  [facility]      - PSG 06/18/08 AHI 64      - BPAP 18/9 with 2 liters oxygen      - Dr. 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   Head:  normocephalic and atraumatic.   Eyes:  vision grossly intact, pupils equal, and pupils round.   Ears:  R ear normal and L ear normal.   Nose:  no external deformity and no nasal discharge.   Mouth:  no gingival abnormalities and pharynx pink and moist.   Neck:  supple and no masses.   Lungs:  normal respiratory effort and normal breath sounds.   Heart:  normal rate and irregular rhythm.   Msk:  no joint effusion to the knees Extremities:  LE edema approx 50% improved;  no erythema Skin:  post right calf open sore resolved without erythema or tendnerness   Impression & Recommendations:  Problem # 1:  PERIPHERAL EDEMA (ICD-782.3)  His updated medication list for this problem includes:    Furosemide 80 Mg Tabs (Furosemide)  .Marland Kitchen... 1po two times a day treat as above, f/u any worsening signs or symptoms  - lasix incr as above  Problem # 2:  CELLULITIS, LEG, RIGHT (ICD-682.6)  The following medications were removed from the medication list:    Doxycycline Hyclate 100 Mg Caps (Doxycycline hyclate) .Marland Kitchen... 1po two times a day resolved, no further meds needed  Problem # 3:  HYPERTENSION (ICD-401.9)  The following medications were removed from the medication list:    Cardizem La 360 Mg Xr24h-tab (Diltiazem hcl coated beads) .Marland Kitchen... 1 by mouth daily His updated medication list for this problem includes:    Metoprolol Tartrate 50 Mg Tabs (Metoprolol  tartrate) .Marland Kitchen... 1 by mouth two times a day    Lisinopril 40 Mg Tabs (Lisinopril) .Marland Kitchen... 1po once daily    Furosemide 80 Mg Tabs (Furosemide) .Marland Kitchen... 1po two times a day    Diltiazem Hcl Er Beads 300 Mg Xr24h-cap (Diltiazem hcl er beads) .Marland Kitchen... 1 by mouth once daily  BP today: 124/84 Prior BP: 152/90 (03/12/2010)  Labs Reviewed: K+: 4.6 (12/18/2009) Creat: : 0.8 (12/18/2009)   Chol: 122 (12/18/2009)   HDL: 37.90 (12/18/2009)   LDL: 67 (12/18/2009)   TG: 86.0 (12/18/2009) stable overall by hx and exam, ok to continue meds/tx as is   Problem # 4:  ATRIAL FIBRILLATION (ICD-427.31)  The following medications were removed from the medication list:    Cardizem La 360 Mg Xr24h-tab (Diltiazem hcl coated beads) .Marland Kitchen... 1 by mouth daily His updated medication list for this problem includes:    Metoprolol Tartrate 50 Mg Tabs (Metoprolol tartrate) .Marland Kitchen... 1 by mouth two times a day    Warfarin Sodium 5 Mg Tabs (Warfarin sodium) ..... Use asd    Diltiazem Hcl Er Beads 300 Mg Xr24h-cap (Diltiazem hcl er beads) .Marland Kitchen... 1 by mouth once daily stable overall by hx and exam, ok to continue meds/tx as is  - for incr lasix as above  Complete Medication List: 1)  Allopurinol 100 Mg Tabs (Allopurinol) .Marland Kitchen.. 1 tablet by mouth once a day 2)  Metoprolol Tartrate 50 Mg Tabs (Metoprolol tartrate) .Marland Kitchen.. 1  by mouth two times a day 3)  Lisinopril 40 Mg Tabs (Lisinopril) .Marland Kitchen.. 1po once daily 4)  Warfarin Sodium 5 Mg Tabs (Warfarin sodium) .... Use asd 5)  Furosemide 80 Mg Tabs (Furosemide) .Marland Kitchen.. 1po two times a day 6)  Klor-con M20 20 Meq Tbcr (Potassium chloride crys cr) .... Take 1 tablet by mouth once a day 7)  Simvastatin 40 Mg Tabs (Simvastatin) .... Take 1 tablet by mouth once a day 8)  Glimepiride 1 Mg Tabs (Glimepiride) .Marland Kitchen.. 1 by mouth once daily 9)  Onetouch Ultra Test Strp (Glucose blood) .... Use 1strip  two times a day to test blood glucose 10)  Diltiazem Hcl Er Beads 300 Mg Xr24h-cap (Diltiazem hcl er beads) .Marland Kitchen.. 1 by mouth once daily  Patient Instructions: 1)  increase the furosemide to 80 mg twice per day 2)  please return in 2 wks for LAB only:  BMET  :  401.1 3)  Continue all previous medications as before this visit  4)  Please schedule a follow-up appointment in 3 months with CPX labs and; 5)  HbgA1C prior to visit, ICD-9: 250.02 6)  Urine Microalbumin prior to visit, ICD-9: Prescriptions: FUROSEMIDE 80 MG TABS (FUROSEMIDE) 1po two times a day  #60 x 11   Entered and Authorized by:   Corwin Levins MD   Signed by:   Corwin Levins MD on 03/26/2010   Method used:   Print then Give to Patient   RxID:   [id number]

## 2010-09-08 NOTE — Assessment & Plan Note (Signed)
Summary: 6 MO ROV /NWS  #   Vital Signs:  Patient profile:   65 year old male Height:      68 inches Weight:      287.25 pounds BMI:     43.83 O2 Sat:      92 % on Room air Temp:     87 degrees F oral Pulse rate:   87 / minute BP sitting:   152 / 90  (left arm) Cuff size:   large  Vitals Entered ByZella Ball Ewing (Dec 23, 2009 8:18 AM)  O2 Flow:  Room air CC: 6 Month ROV/RE   Primary Care Provider:  Dr. Jonny Ruiz  CC:  6 Month ROV/RE.  History of Present Illness: overall doing well;  BP at home tiwce weekly approx 140-145; Pt denies CP, sob, doe, wheezing, orthopnea, pnd, worsening LE edema, palps, dizziness or syncope  Pt denies new neuro symptoms such as headache, facial or extremity weakness  Pt denies polydipsia, polyuria, or low sugar symptoms such as shakiness improved with eating.  Overall good compliance with meds, trying to follow low chol, DM diet, wt stable, little excercise however  Overall god compliance, good med tolerance.    Problems Prior to Update: 1)  Obesity Hypoventilation Syndrome  (ICD-278.8) 2)  Atrial Fibrillation  (ICD-427.31) 3)  Obstructive Sleep Apnea  (ICD-327.23) 4)  Preventive Health Care  (ICD-V70.0) 5)  Anticoagulation Therapy  (ICD-V58.61) 6)  Peripheral Edema  (ICD-782.3) 7)  Unspecified Hearing Loss  (ICD-389.9) 8)  Family History of Cad Male 1st Degree Relative <50  (ICD-V17.3) 9)  Family History Breast Cancer 1st Degree Relative <50  (ICD-V16.3) 10)  Common Migraine  (ICD-346.10) 11)  Bronchitis, Chronic  (ICD-491.9) 12)  Prostate Specific Antigen, Elevated  (ICD-790.93) 13)  Diabetes Mellitus, Type II  (ICD-250.00) 14)  Gout  (ICD-274.9) 15)  Overweight  (ICD-278.02) 16)  Hypertension  (ICD-401.9) 17)  Hyperlipidemia  (ICD-272.4)  Medications Prior to Update: 1)  Allopurinol 100 Mg Tabs (Allopurinol) .Marland Kitchen.. 1 Tablet By Mouth Once A Day 2)  Metoprolol Tartrate 50 Mg Tabs (Metoprolol Tartrate) .Marland Kitchen.. 1 By Mouth Two Times A Day 3)   Lisinopril 20 Mg Tabs (Lisinopril) .Marland Kitchen.. 1 By Mouth Once Daily 4)  Cardizem La 360 Mg Xr24h-Tab (Diltiazem Hcl Coated Beads) .Marland Kitchen.. 1 By Mouth Daily 5)  Warfarin Sodium 5 Mg Tabs (Warfarin Sodium) .... Use Asd 6)  Furosemide 40 Mg Tabs (Furosemide) .... Take 1 Two Times A Day 7)  Klor-Con M20 20 Meq Tbcr (Potassium Chloride Crys Cr) .... Take 1 Tablet By Mouth Once A Day 8)  Simvastatin 40 Mg Tabs (Simvastatin) .... Take 1 Tablet By Mouth Once A Day 9)  Glimepiride 1 Mg  Tabs (Glimepiride) .Marland Kitchen.. 1 and 1/2 By Mouth Qam 10)  Onetouch Ultra Test  Strp (Glucose Blood) .... Use 1strip  Two Times A Day To Test Blood Glucose 11)  Diltiazem Hcl Er Beads 300 Mg Xr24h-Cap (Diltiazem Hcl Er Beads) .Marland Kitchen.. 1 By Mouth Once Daily  Current Medications (verified): 1)  Allopurinol 100 Mg Tabs (Allopurinol) .Marland Kitchen.. 1 Tablet By Mouth Once A Day 2)  Metoprolol Tartrate 50 Mg Tabs (Metoprolol Tartrate) .Marland Kitchen.. 1 By Mouth Two Times A Day 3)  Lisinopril 40 Mg Tabs (Lisinopril) .Marland Kitchen.. 1po Once Daily 4)  Cardizem La 360 Mg Xr24h-Tab (Diltiazem Hcl Coated Beads) .Marland Kitchen.. 1 By Mouth Daily 5)  Warfarin Sodium 5 Mg Tabs (Warfarin Sodium) .... Use Asd 6)  Furosemide 40 Mg Tabs (Furosemide) .... Take 1 Two  Times A Day 7)  Klor-Con M20 20 Meq Tbcr (Potassium Chloride Crys Cr) .... Take 1 Tablet By Mouth Once A Day 8)  Simvastatin 40 Mg Tabs (Simvastatin) .... Take 1 Tablet By Mouth Once A Day 9)  Glimepiride 1 Mg  Tabs (Glimepiride) .Marland Kitchen.. 1 By Mouth Once Daily 10)  Onetouch Ultra Test  Strp (Glucose Blood) .... Use 1strip  Two Times A Day To Test Blood Glucose 11)  Diltiazem Hcl Er Beads 300 Mg Xr24h-Cap (Diltiazem Hcl Er Beads) .Marland Kitchen.. 1 By Mouth Once Daily  Allergies (verified): No Known Drug Allergies  Past History:  Past Medical History: Last updated: 04/03/2009 Hyperlipidemia Hypertension Obesity Gout Diabetes mellitus, type II elevated PSA Chronic bronchitis migraine Pneumonia 2005 and 2009 afib 7/09 - dr Jens Som - s/p  failed conversion x 2 - dr Johney Frame Anticoagulation therapy increased PSA - down with antibx 2008 -  dr Brunilda Payor  [facility]      - PSG 06/18/08 AHI 64      - BPAP 18/9 with 2 liters oxygen      - Dr. 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   Head:  normocephalic and atraumatic.   Eyes:  vision grossly intact, pupils equal, and pupils round.   Ears:  R ear normal and L ear normal.   Nose:  no external deformity and no nasal discharge.   Mouth:  no gingival abnormalities and pharynx pink and moist.   Neck:  supple and no masses.   Lungs:  normal respiratory effort and normal breath sounds.   Heart:  normal rate and regular rhythm.   Extremities:  no edema, no erythema    Impression & Recommendations:  Problem # 1:  DIABETES MELLITUS, TYPE II (ICD-250.00)  His updated medication list for this problem includes:    Lisinopril 40 Mg Tabs (Lisinopril) .Marland Kitchen... 1po once daily    Glimepiride 1 Mg Tabs (Glimepiride) .Marland Kitchen... 1 by mouth once daily to decrease the OHA for safety adn re-doubling of effort to lose wt, overall o/w good control, stable overall by hx and exam,  Problem # 2:  HYPERTENSION (ICD-401.9)  His updated medication list for this problem includes:    Metoprolol Tartrate 50 Mg Tabs (Metoprolol tartrate) .Marland Kitchen... 1 by mouth two times a day    Lisinopril 40 Mg Tabs (Lisinopril) .Marland Kitchen... 1po once daily    Cardizem La 360 Mg Xr24h-tab (Diltiazem hcl coated beads) .Marland Kitchen... 1 by mouth daily    Furosemide 40 Mg Tabs  (Furosemide) .Marland Kitchen... Take 1 two times a day    Diltiazem Hcl Er Beads 300 Mg Xr24h-cap (Diltiazem hcl er beads) .Marland Kitchen... 1 by mouth once daily  BP today: 152/90 Prior BP: 120/73 (08/25/2009)  Labs Reviewed: K+: 4.6 (12/18/2009) Creat: : 0.8 (12/18/2009)   Chol: 122 (12/18/2009)   HDL: 37.90 (12/18/2009)   LDL: 67 (12/18/2009)   TG: 86.0 (12/18/2009) uncontrolled - to increase the ACe as above, f/u at home and next visit - goal < 130/85  Problem # 3:  HYPERLIPIDEMIA (ICD-272.4)  His updated medication list for this  problem includes:    Simvastatin 40 Mg Tabs (Simvastatin) .Marland Kitchen... Take 1 tablet by mouth once a day  Labs Reviewed: SGOT: 20 (06/20/2009)   SGPT: 20 (06/20/2009)   HDL:37.90 (12/18/2009), 33.70 (06/20/2009)  LDL:67 (12/18/2009), 68 (06/20/2009)  Chol:122 (12/18/2009), 120 (06/20/2009)  Trig:86.0 (12/18/2009), 91.0 (06/20/2009) stable overall by hx and exam, ok to continue meds/tx as is   Complete Medication List: 1)  Allopurinol 100 Mg Tabs (Allopurinol) .Marland Kitchen.. 1 tablet by mouth once a day 2)  Metoprolol Tartrate 50 Mg Tabs (Metoprolol tartrate) .Marland Kitchen.. 1 by mouth two times a day 3)  Lisinopril 40 Mg Tabs (Lisinopril) .Marland Kitchen.. 1po once daily 4)  Cardizem La 360 Mg Xr24h-tab (Diltiazem hcl coated beads) .Marland Kitchen.. 1 by mouth daily 5)  Warfarin Sodium 5 Mg Tabs (Warfarin sodium) .... Use asd 6)  Furosemide 40 Mg Tabs (Furosemide) .... Take 1 two times a day 7)  Klor-con M20 20 Meq Tbcr (Potassium chloride crys cr) .... Take 1 tablet by mouth once a day 8)  Simvastatin 40 Mg Tabs (Simvastatin) .... Take 1 tablet by mouth once a day 9)  Glimepiride 1 Mg Tabs (Glimepiride) .Marland Kitchen.. 1 by mouth once daily 10)  Onetouch Ultra Test Strp (Glucose blood) .... Use 1strip  two times a day to test blood glucose 11)  Diltiazem Hcl Er Beads 300 Mg Xr24h-cap (Diltiazem hcl er beads) .Marland Kitchen.. 1 by mouth once daily  Patient Instructions: 1)  decrease the glimeparide to 1 mg per day (from the 1 and 1/2 mg per day) 2)   it is ok to NOT take the glimeparide if you have a "sick day" when you are not eating well for some reason 3)  increaese the lisinopril to 40 mg per day 4)  Continue all previous medications as before this visit  5)  Please schedule a follow-up appointment in 6 months with CPX labs and: 6)  HbgA1C prior to visit, ICD-9: 250.02 7)  Urine Microalbumin prior to visit, ICD-9: Prescriptions: LISINOPRIL 40 MG TABS (LISINOPRIL) 1po once daily  #90 x 3   Entered and Authorized by:   Corwin Levins MD   Signed by:   Corwin Levins MD on 12/23/2009   Method used:   Print then Give to Patient   RxID:   [id number] GLIMEPIRIDE 1 MG  TABS (GLIMEPIRIDE) 1 by mouth once daily  #90 x 3   Entered and Authorized by:   Corwin Levins MD   Signed by:   Corwin Levins MD on 12/23/2009   Method used:   Print then Give to Patient   RxID:   [id number]

## 2010-09-08 NOTE — Progress Notes (Signed)
  Phone Note Refill Request  on November 21, 2009 1:14 PM  Refills Requested: Medication #1:  ALLOPURINOL 100 MG TABS 1 tablet by mouth once a day   Dosage confirmed as above?Dosage Confirmed   Notes: [facility] Initial call taken by: Scharlene Gloss,  November 21, 2009 1:14 PM    Prescriptions: ALLOPURINOL 100 MG TABS (ALLOPURINOL) 1 tablet by mouth once a day  #30 Each x 8   Entered by:   Scharlene Gloss   Authorized by:   Corwin Levins MD   Signed by:   Scharlene Gloss on 11/21/2009   Method used:   Faxed to ...       [facility]       [id number] [place], [place]  [address]       Ph: [phone] or [phone]       Fax: [phone]   RxID:   [id number]

## 2010-09-08 NOTE — Assessment & Plan Note (Signed)
Summary: f/u O2///kp   Visit Type:  Follow-up Copy to:  Dr. Hillis Range Primary Provider/Referring Provider:  Dr. Jonny Ruiz  CC:  OSA.  OHS.  The patient says he is sleeping well on Bipap when the machine is working properly. Patients sats were low  when first checked today.Marland Kitchen  History of Present Illness:  I met [name] [name] in follow up for his severe OSA, OHS, and chronic bronchitis with history of tobacco abuse.  He has gained weight since his last visit in August 2010.  He has some cough with clear sputum.  This is not too bad.  He denies wheezing, chest tightness, hemoptysis, or fever.  His sinuses are okay.  He gets tired and winded easily with minimal activity.  He is okay at rest.  He uses his BPAP every night.  He feels this helps his sleep and energy.  He has been using oxygen while he sleeps with his BPAP.  He has not been using oxygen during the day.  He has been getting leg swelling.    Current Medications (verified): 1)  Allopurinol 100 Mg Tabs (Allopurinol) .Marland Kitchen.. 1 Tablet By Mouth Once A Day 2)  Metoprolol Tartrate 50 Mg Tabs (Metoprolol Tartrate) .Marland Kitchen.. 1 By Mouth Two Times A Day 3)  Lisinopril 40 Mg Tabs (Lisinopril) .Marland Kitchen.. 1po Once Daily 4)  Warfarin Sodium 5 Mg Tabs (Warfarin Sodium) .... Use Asd 5)  Furosemide 80 Mg Tabs (Furosemide) .Marland Kitchen.. 1po Two Times A Day 6)  Klor-Con M20 20 Meq Tbcr (Potassium Chloride Crys Cr) .... Take 1 Tablet By Mouth Once A Day 7)  Simvastatin 40 Mg Tabs (Simvastatin) .... Take 1 Tablet By Mouth Once A Day 8)  Glimepiride 1 Mg  Tabs (Glimepiride) .Marland Kitchen.. 1 By Mouth Once Daily 9)  Onetouch Ultra Test  Strp (Glucose Blood) .... Use 1strip  Two Times A Day To Test Blood Glucose 10)  Diltiazem Hcl Er Beads 300 Mg Xr24h-Cap (Diltiazem Hcl Er Beads) .Marland Kitchen.. 1 By Mouth Once Daily  Allergies (verified): No Known Drug Allergies  Past History:  Past Medical History: Hyperlipidemia Hypertension Obesity Gout Diabetes mellitus, type II elevated PSA Chronic  bronchitis migraine Pneumonia 2005 and 2009 afib 7/09 - dr Jens Som - s/p failed conversion x 2 - dr Johney Frame Anticoagulation therapy increased PSA - down with antibx 2008 -  dr Brunilda Payor  OSA      - PSG 06/18/08 AHI 64      - BPAP 18/9 with 2 liters oxygen      - Dr. Coralyn Helling Obesity/Hypoventilation Syndrome      - 2 liters oxygen with exertion Chronic bronchitis      - Spirometry 04/03/09 FEV1 2.08(69%), FEV1% 77 Atrial Fibrillation  Past Surgical History: Reviewed history from 10/16/2008 and no changes required. Appendectomy 1974 Inguinal herniorrhaphy x2 s/p right hand surgury 2009 - dr Izora Ribas s/p cardioversion 05/21/08  Vital Signs:  Patient profile:   65 year old male Height:      68 inches (172.72 cm) Weight:      285 pounds (129.55 kg) BMI:     43.49 O2 Sat:      86 % on Room air Temp:     98.1 degrees F (36.72 degrees C) oral Pulse rate:   73 / minute BP sitting:   120 / 68  (left arm) Cuff size:   large  Vitals Entered By: Michel Bickers CMA (April 03, 2010 4:03 PM)  O2 Sat at Rest %:  86 O2 Flow:  Room  air  O2 Sat Comments Patients sats were low after walking from the lobby to the exam room. Read at 86% on room air. After resting about 3 minutes his sats came up to 94% on room air and his pulse was 86.Michel Bickers [facility]  April 03, 2010 4:05 PM  Serial Vital Signs/Assessments:  Comments: 4:34 PM Ambulatory Pulse Oximetry  Resting; HR__62___    02 Sat__93% on room air___  Lap1 (185 feet)   HR__85___   02 Sat_84% on room air after walking 1/2 lap____ Lap2 (185 feet)   HR_____   02 Sat_____    Lap3 (185 feet)   HR_____   02 Sat_____  ___Test Completed without Difficulty _x__Test Stopped due to:  Patients sats dropped to 84% after walking 1/2 lap...placed on 2 liters cont O2 and sats recovered to 97% and pulse was 67.  By: Michel Bickers CMA    Physical Exam  General:  obese.   Nose:  no deformity, discharge, inflammation, or lesions Mouth:  MP 3, 2+  tonsills, no oral lesion Neck:  no JVD.   Lungs:  clear bilaterally to auscultation and percussion Heart:  regular rate and rhythm, S1, S2 without murmurs, rubs, gallops, or clicks Abdomen:  bowel sounds positive; abdomen soft and non-tender without masses, or organomegaly Extremities:  2+ non-pitting edema, chronic venous stasis changes Neurologic:  normal CN II-XII and strength normal.   Cervical Nodes:  no significant adenopathy Psych:  alert and cooperative; normal mood and affect; normal attention span and concentration   Impression & Recommendations:  Problem # 1:  OBSTRUCTIVE SLEEP APNEA (ICD-327.23)  He is to continue with BPAP.  Will arrange for a new mask.  Will get his BPAP download.  Problem # 2:  OBESITY HYPOVENTILATION SYNDROME (ICD-278.8)  He has oxygen desaturation with exertion.  Will start him on 2 liters oxygen with exertion, and continue 2 liters at night with BPAP.  Will arrange for ONO on 2 liters and BPAP.  Will also arrange for portable oxygen.  Problem # 3:  BRONCHITIS, CHRONIC (ICD-491.9)  He has mild cough.  Will monitor clinically.    Complete Medication List: 1)  Allopurinol 100 Mg Tabs (Allopurinol) .Marland Kitchen.. 1 tablet by mouth once a day 2)  Metoprolol Tartrate 50 Mg Tabs (Metoprolol tartrate) .Marland Kitchen.. 1 by mouth two times a day 3)  Lisinopril 40 Mg Tabs (Lisinopril) .Marland Kitchen.. 1po once daily 4)  Warfarin Sodium 5 Mg Tabs (Warfarin sodium) .... Use asd 5)  Furosemide 80 Mg Tabs (Furosemide) .Marland Kitchen.. 1po two times a day 6)  Klor-con M20 20 Meq Tbcr (Potassium chloride crys cr) .... Take 1 tablet by mouth once a day 7)  Simvastatin 40 Mg Tabs (Simvastatin) .... Take 1 tablet by mouth once a day 8)  Glimepiride 1 Mg Tabs (Glimepiride) .Marland Kitchen.. 1 by mouth once daily 9)  Onetouch Ultra Test Strp (Glucose blood) .... Use 1strip  two times a day to test blood glucose 10)  Diltiazem Hcl Er Beads 300 Mg Xr24h-cap (Diltiazem hcl er beads) .Marland Kitchen.. 1 by mouth once daily  Other  Orders: Est. Patient Level IV ([address]) DME Referral (DME)  Patient Instructions: 1)  Use 2 liters oxygen with exertion and sleep with BPAP 2)  Will get report from BPAP machine 3)  Will get oxygen test using BPAP and oxygen 4)  Will arrange for new BPAP mask 5)  Follow up in 3 months

## 2010-09-08 NOTE — Progress Notes (Signed)
  Phone Note Refill Request  on November 21, 2009 8:24 AM  Refills Requested: Medication #1:  KLOR-CON M20 20 MEQ TBCR Take 1 tablet by mouth once a day   Dosage confirmed as above?Dosage Confirmed   Last Refilled: 04/04/2009   Notes: [facility] Initial call taken by: Scharlene Gloss,  November 21, 2009 8:24 AM    Prescriptions: KLOR-CON M20 20 MEQ TBCR (POTASSIUM CHLORIDE CRYS CR) Take 1 tablet by mouth once a day  #30 Each x 5   Entered by:   Scharlene Gloss   Authorized by:   Corwin Levins MD   Signed by:   Scharlene Gloss on 11/21/2009   Method used:   Faxed to ...       [facility] ([facility])       [address] [place], [place]  [address]       Ph: [phone] or [phone]       Fax: [phone]   RxID:   [id number]

## 2010-09-09 ENCOUNTER — Telehealth: Payer: Self-pay | Admitting: Internal Medicine

## 2010-09-10 NOTE — Progress Notes (Signed)
Summary: Rx refill req  Phone Note Refill Request Message from:  Fax from Pharmacy on August 21, 2010 4:58 PM  Refills Requested: Medication #1:  ALLOPURINOL 100 MG TABS 1 tablet by mouth once a day   Dosage confirmed as above?Dosage Confirmed   Supply Requested: 3 months  Medication #2:  LISINOPRIL 40 MG TABS 1po once daily   Dosage confirmed as above?Dosage Confirmed   Supply Requested: 3 months  Medication #3:  DILTIAZEM HCL ER BEADS 300 MG XR24H-CAP 1 by mouth once daily.   Dosage confirmed as above?Dosage Confirmed   Supply Requested: 3 months  Method Requested: Electronic Initial call taken by: Margaret Pyle, CMA,  August 21, 2010 4:58 PM    Prescriptions: DILTIAZEM HCL ER BEADS 300 MG XR24H-CAP (DILTIAZEM HCL ER BEADS) 1 by mouth once daily  #30 x 2   Entered by:   Margaret Pyle, CMA   Authorized by:   Corwin Levins MD   Signed by:   Margaret Pyle, CMA on 08/21/2010   Method used:   Electronically to        [facility] (retail)       [address]       [place], [place]  [address]       Ph: [phone]       Fax: [phone]   RxID:   [id number] LISINOPRIL 40 MG TABS (LISINOPRIL) 1po once daily  #30 x 2   Entered by:   Margaret Pyle, CMA   Authorized by:   Corwin Levins MD   Signed by:   Margaret Pyle, CMA on 08/21/2010   Method used:   Electronically to        [facility] (retail)       [address]       [place], [place]  [address]       Ph: [phone]       Fax: [phone]   RxID:   [id number] ALLOPURINOL 100 MG TABS (ALLOPURINOL) 1 tablet by mouth once a day  #30 Each x 2   Entered by:   Margaret Pyle, CMA   Authorized by:   Corwin Levins MD   Signed by:   Margaret Pyle, CMA on 08/21/2010   Method used:   Electronically to        [facility] (retail)       [address]  [place], [place]  [address]       Ph: [phone]       Fax: [phone]   RxID:   [id number]

## 2010-09-10 NOTE — Medication Information (Signed)
Summary: rov/sp   Anticoagulant Therapy  Managed by: Louann Sjogren, PharmD Referring MD: Olga Millers MD PCP: Dr. Gwen Pounds MD: Elease Hashimoto MD, Aneta Mins Indication 1: Atrial Fibrillation (ICD-427.31) Lab Used: [facility] Site: [facility] INR POC 3.2 INR RANGE 2 - 3  Dietary changes: no    Health status changes: no    Bleeding/hemorrhagic complications: no    Recent/future hospitalizations: no    Any changes in medication regimen? no    Recent/future dental: no  Any missed doses?: no       Is patient compliant with meds? yes      Comments: Does not eat much vitamin k containing foods. will begin to eat slightly more.  Allergies: No Known Drug Allergies  Anticoagulation Management History:      Positive risk factors for bleeding include history of GI bleeding and presence of serious comorbidities.  Negative risk factors for bleeding include an age less than 52 years old.  The bleeding index is 'intermediate risk'.  Positive CHADS2 values include History of HTN and History of Diabetes.  Negative CHADS2 values include Age > 20 years old.  The start date was 02/09/2008.  His last INR was 3.6 RATIO.  Anticoagulation responsible provider: Maicol Bowland MD, Aneta Mins.  INR POC: 3.2.  Exp: 09/2011.    Anticoagulation Management Assessment/Plan:      The patient's current anticoagulation dose is Warfarin sodium 5 mg tabs: use asd.  The target INR is 2 - 3.  The next INR is due 09/08/2010.  Anticoagulation instructions were given to patient.  Results were reviewed/authorized by Louann Sjogren, PharmD.  He was notified by Louann Sjogren PharmD.         Prior Anticoagulation Instructions: INR 3.6  Skip today's dose of Coumadin then resume same dose of 1 tablet every day except 1/2 tablet on Monday.  Recheck INR in 3 weeks.   Current Anticoagulation Instructions: INR 3.2 (goal 2-3)  Continue with same dosing schedule below.  Eat about 2 servings of vitamin K containing  foods. Next appointment: Jan 31st at 10:15AM.

## 2010-09-10 NOTE — Medication Information (Signed)
Summary: rov/nb   Anticoagulant Therapy  Managed by: Weston Brass, PharmD Referring MD: Olga Millers MD PCP: Dr. Gwen Pounds MD: Patty Sermons Indication 1: Atrial Fibrillation (ICD-427.31) Lab Used: [facility] Site: [facility] INR POC 3.6 INR RANGE 2 - 3  Dietary changes: no    Health status changes: no    Bleeding/hemorrhagic complications: no    Recent/future hospitalizations: no    Any changes in medication regimen? no    Recent/future dental: no  Any missed doses?: no       Is patient compliant with meds? yes       Allergies: No Known Drug Allergies  Anticoagulation Management History:      The patient is taking warfarin and comes in today for a routine follow up visit.  Positive risk factors for bleeding include history of GI bleeding and presence of serious comorbidities.  Negative risk factors for bleeding include an age less than 89 years old.  The bleeding index is 'intermediate risk'.  Positive CHADS2 values include History of HTN and History of Diabetes.  Negative CHADS2 values include Age > 56 years old.  The start date was 02/09/2008.  His last INR was 3.6 RATIO.  Anticoagulation responsible provider: [place].  INR POC: 3.6.  Cuvette Lot#: [id number].  Exp: 09/2011.    Anticoagulation Management Assessment/Plan:      The patient's current anticoagulation dose is Warfarin sodium 5 mg tabs: use asd.  The target INR is 2 - 3.  The next INR is due 08/18/2010.  Anticoagulation instructions were given to patient.  Results were reviewed/authorized by Weston Brass, PharmD.  He was notified by Weston Brass PharmD.         Prior Anticoagulation Instructions: INR 2.7 Continue previous dose of 1 tablet everyday except 0.5 tablet on Monday Recheck INR in 4 weeks  Current Anticoagulation Instructions: INR 3.6  Skip today's dose of Coumadin then resume same dose of 1 tablet every day except 1/2 tablet on Monday.  Recheck INR in 3 weeks.

## 2010-09-16 NOTE — Progress Notes (Signed)
  Phone Note Refill Request Message from:  Fax from Pharmacy on September 09, 2010 10:05 AM  Refills Requested: Medication #1:  GLIMEPIRIDE 1 MG  TABS 1 by mouth once daily   Dosage confirmed as above?Dosage Confirmed   Notes: [facility] Initial call taken by: Zella Ball Ewing CMA (AAMA),  September 09, 2010 10:05 AM    Prescriptions: GLIMEPIRIDE 1 MG  TABS (GLIMEPIRIDE) 1 by mouth once daily  #90 x 3   Entered by:   Scharlene Gloss CMA (AAMA)   Authorized by:   Corwin Levins MD   Signed by:   Scharlene Gloss CMA (AAMA) on 09/09/2010   Method used:   Faxed to ...       [facility] (retail)       [address]       [place], [place]  [address]       Ph: [phone]       Fax: [phone]   RxID:   [id number]

## 2010-09-16 NOTE — Medication Information (Signed)
Summary: rov/amr  Anticoagulant Therapy  Managed by: Weston Brass, PharmD Referring MD: Olga Millers MD PCP: Dr. Gwen Pounds MD: Myrtis Ser MD, Tinnie Gens Indication 1: Atrial Fibrillation (ICD-427.31) Lab Used: [facility] Site: [facility] INR POC 3.3 INR RANGE 2 - 3  Dietary changes: no    Health status changes: no    Bleeding/hemorrhagic complications: no    Recent/future hospitalizations: no    Any changes in medication regimen? no    Recent/future dental: no  Any missed doses?: no       Is patient compliant with meds? yes       Allergies: No Known Drug Allergies  Anticoagulation Management History:      The patient is taking warfarin and comes in today for a routine follow up visit.  Positive risk factors for bleeding include history of GI bleeding and presence of serious comorbidities.  Negative risk factors for bleeding include an age less than 93 years old.  The bleeding index is 'intermediate risk'.  Positive CHADS2 values include History of HTN and History of Diabetes.  Negative CHADS2 values include Age > 50 years old.  The start date was 02/09/2008.  His last INR was 3.6 RATIO.  Anticoagulation responsible provider: Myrtis Ser MD, Tinnie Gens.  INR POC: 3.3.  Cuvette Lot#: [id number].  Exp: 08/2011.    Anticoagulation Management Assessment/Plan:      The patient's current anticoagulation dose is Warfarin sodium 5 mg tabs: use asd.  The target INR is 2 - 3.  The next INR is due 09/29/2010.  Anticoagulation instructions were given to patient.  Results were reviewed/authorized by Weston Brass, PharmD.  He was notified by Stephannie Peters, PharmD Candidate .         Prior Anticoagulation Instructions: INR 3.2 (goal 2-3)  Continue with same dosing schedule below.  Eat about 2 servings of vitamin K containing foods. Next appointment: Jan 31st at 10:15AM.  Current Anticoagulation Instructions: INR 3.3  Coumadin 5 mg tablets - Take 1/2 tablet today (1/31), then take 1 tablet every  day except 1/2 tablet on Mondays and Fridays

## 2010-09-18 ENCOUNTER — Encounter: Payer: Self-pay | Admitting: Cardiology

## 2010-09-18 ENCOUNTER — Encounter

## 2010-09-18 ENCOUNTER — Ambulatory Visit (INDEPENDENT_AMBULATORY_CARE_PROVIDER_SITE_OTHER): Admitting: Cardiology

## 2010-09-18 DIAGNOSIS — I4891 Unspecified atrial fibrillation: Secondary | ICD-10-CM

## 2010-09-18 DIAGNOSIS — I10 Essential (primary) hypertension: Secondary | ICD-10-CM

## 2010-09-21 ENCOUNTER — Telehealth: Payer: Self-pay | Admitting: Internal Medicine

## 2010-09-24 ENCOUNTER — Telehealth: Payer: Self-pay | Admitting: Cardiology

## 2010-09-24 ENCOUNTER — Telehealth: Payer: Self-pay | Admitting: Internal Medicine

## 2010-09-24 NOTE — Assessment & Plan Note (Signed)
Summary: f1y/dm    Referring Provider:  Dr. Hillis Range Primary Provider:  Dr. Jonny Ruiz  CC:  no complaints.  History of Present Illness: [name] [name] is a pleasant  gentleman who has a history of hypertension, hyperlipidemia, diabetes and permanent atrial fibrillation.  He has undergone attempted cardioversion previously, but he did not hold sinus rhythm.  His most recent Myoview on March 08, 2008 showed an ejection fraction of 54% and normal perfusion.  A previous echocardiogram in July 2009 showed normal LV function, mild RVE, and mild right atrial enlargement.  I last saw him in Jan 2011. Since then he has dyspnea with more moderate activities but not with routine activities. It is relieved with rest. It is not associated with chest pain. There is no orthopnea, PND, paations, syncope or chest pain. He has chronic pedal edema which is unchanged. There's been no bleeding.  Current Medications (verified): 1)  Allopurinol 100 Mg Tabs (Allopurinol) .Marland Kitchen.. 1 Tablet By Mouth Once A Day 2)  Metoprolol Tartrate 50 Mg Tabs (Metoprolol Tartrate) .Marland Kitchen.. 1 By Mouth Two Times A Day 3)  Lisinopril 40 Mg Tabs (Lisinopril) .Marland Kitchen.. 1po Once Daily 4)  Warfarin Sodium 5 Mg Tabs (Warfarin Sodium) .... Use Asd 5)  Furosemide 80 Mg Tabs (Furosemide) .Marland Kitchen.. 1po Two Times A Day 6)  Klor-Con M20 20 Meq Tbcr (Potassium Chloride Crys Cr) .... Take 1 Tablet By Mouth Once A Day 7)  Simvastatin 40 Mg Tabs (Simvastatin) .... Take 1 Tablet By Mouth Once A Day 8)  Glimepiride 1 Mg  Tabs (Glimepiride) .Marland Kitchen.. 1 By Mouth Once Daily 9)  Onetouch Ultra Test  Strp (Glucose Blood) .... Use 1strip  Two Times A Day To Test Blood Glucose 10)  Diltiazem Hcl Er Beads 300 Mg Xr24h-Cap (Diltiazem Hcl Er Beads) .Marland Kitchen.. 1 By Mouth Once Daily  Allergies: No Known Drug Allergies  Past History:  Past Medical History: Hyperlipidemia Hypertension Obesity Gout Diabetes mellitus, type II migraine Pneumonia 2005 and 2009 afib 7/09 - dr Jens Som - s/p  failed conversion x 2 - dr Johney Frame Anticoagulation therapy increased PSA - down with antibx 2008 -  dr Brunilda Payor  OSA      - PSG 06/18/08 AHI 64      - BPAP 18/9 with 4 liters oxygen      - Dr. Coralyn Helling Obesity/Hypoventilation Syndrome      - 2 liters oxygen during the day at rest, and 4 liters with activity Chronic bronchitis      - Spirometry 04/03/09 FEV1 2.08(69%), FEV1% 77  Past Surgical History: Reviewed history from 10/16/2008 and no changes required. Appendectomy 1974 Inguinal herniorrhaphy x2 s/p right hand surgury 2009 - dr Izora Ribas s/p cardioversion 05/21/08  Social History: Reviewed history from 06/26/2010 and no changes required. Former Smoker Alcohol use-yes Married 2 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 Drug use-no  Review of Systems       chronic pedal edema and back pain  but no fevers or chills, productive cough, hemoptysis, dysphasia, odynophagia, melena, hematochezia, dysuria, hematuria, rash, seizure activity, orthopnea, PND, claudication. Remaining systems are negative.   Vital Signs:  Patient profile:   65 year old male Height:      68 inches Weight:      291 pounds BMI:     44.41 Pulse rate:   90 / minute Resp:     14 per minute BP sitting:   132 / 80  (left arm)  Vitals Entered By: Kem Parkinson (September 18, 2010 8:48  AM)  Physical Exam  General:  Well-developed obese in no acute distress.  Skin is warm and dry.  HEENT is normal.  Neck is supple. No thyromegaly.  Chest is clear to auscultation with normal expansion.  Cardiovascular exam is irregular Abdominal exam nontender or distended. No masses palpated. Extremities show 1+ edema and chronic skin changes. neuro grossly intact    EKG  Procedure date:  06/26/2010  Findings:      Atrial fibrillation at a rate of 103. Right axis dictation. Nonspecific ST changes.  Impression & Recommendations:  Problem # 1:  COUMADIN THERAPY (ICD-V58.61) Monitored in the Coumadin  clinic. Goal INR 2-3.  Problem # 2:  OBESITY HYPOVENTILATION SYNDROME (ICD-278.8) Pt counseled on weight loss.  Problem # 3:  ATRIAL FIBRILLATION (ICD-427.31) Continue beta blocker and calcium blocker for rate control. Check 24 hour monitor to make sure rate is adequately controlled. Continue Coumadin. His updated medication list for this problem includes:    Metoprolol Tartrate 50 Mg Tabs (Metoprolol tartrate) .Marland Kitchen... 1 by mouth two times a day    Warfarin Sodium 5 Mg Tabs (Warfarin sodium) ..... Use asd  Orders: Holter (Holter)  Problem # 4:  PERIPHERAL EDEMA (ICD-782.3) Pt being seen in the lymphedema clinic.  Problem # 5:  DIABETES MELLITUS, TYPE II (ICD-250.00)  His updated medication list for this problem includes:    Lisinopril 40 Mg Tabs (Lisinopril) .Marland Kitchen... 1po once daily    Glimepiride 1 Mg Tabs (Glimepiride) .Marland Kitchen... 1 by mouth once daily  Problem # 6:  HYPERTENSION (ICD-401.9) Blood pressure controlled with present medications. Will continue. Potassium and renal function monitored by primary care. His updated medication list for this problem includes:    Metoprolol Tartrate 50 Mg Tabs (Metoprolol tartrate) .Marland Kitchen... 1 by mouth two times a day    Lisinopril 40 Mg Tabs (Lisinopril) .Marland Kitchen... 1po once daily    Furosemide 80 Mg Tabs (Furosemide) .Marland Kitchen... 1po two times a day    Diltiazem Hcl Er Beads 300 Mg Xr24h-cap (Diltiazem hcl er beads) .Marland Kitchen... 1 by mouth once daily  Problem # 7:  HYPERLIPIDEMIA (ICD-272.4) Continue statin. Lipids and liver monitored by primary care. His updated medication list for this problem includes:    Simvastatin 40 Mg Tabs (Simvastatin) .Marland Kitchen... Take 1 tablet by mouth once a day  Patient Instructions: 1)  Your physician wants you to follow-up in: ONE YEAR  You will receive a reminder letter in the mail two months in advance. If you don't receive a letter, please call our office to schedule the follow-up appointment. 2)  Your physician has recommended that you wear a  holter monitor.  Holter monitors are medical devices that record the heart's electrical activity. Doctors most often use these monitors to diagnose arrhythmias. Arrhythmias are problems with the speed or rhythm of the heartbeat. The monitor is a small, portable device. You can wear one while you do your normal daily activities. This is usually used to diagnose what is causing palpitations/syncope (passing out).24 HOUR

## 2010-09-24 NOTE — Letter (Signed)
Summary: [facility]  [facility]   Imported By: Sherian Rein 09/17/2010 09:10:44  _____________________________________________________________________  External Attachment:    Type:   Image     Comment:   External Document

## 2010-09-25 ENCOUNTER — Encounter: Payer: Self-pay | Admitting: Cardiology

## 2010-09-28 DIAGNOSIS — I4891 Unspecified atrial fibrillation: Secondary | ICD-10-CM

## 2010-09-30 NOTE — Progress Notes (Signed)
Summary: pt's wife has questions  Medications Added DILTIAZEM HCL ER BEADS 240 MG XR24H-CAP (DILTIAZEM HCL ER BEADS) one tablet by mouth once daily       Phone Note Outgoing Call   Call placed by: Deliah Goody, RN,  September 24, 2010 10:10 AM Summary of Call: event monitor reviewed by dr Jens Som and show atrial fib with mildly reduced rate, per dr Jens Som pt to decrease cardizem to 240mg  once daily .  Left message to call back  Deliah Goody, RN  September 24, 2010 10:11 AM   Follow-up for Phone Call        pt's wife lavonne has questions pls call [phone] Glynda Jaeger  September 24, 2010 11:45 AM  spoke with pts wife, she is aware of the monitor results and the med change Deliah Goody, RN  September 24, 2010 4:25 PM\par     New/Updated Medications: DILTIAZEM HCL ER BEADS 240 MG XR24H-CAP (DILTIAZEM HCL ER BEADS) one tablet by mouth once daily Prescriptions: DILTIAZEM HCL ER BEADS 240 MG XR24H-CAP (DILTIAZEM HCL ER BEADS) one tablet by mouth once daily  #90 x 4   Entered by:   Deliah Goody, RN   Authorized by:   Ferman Hamming, MD, [facility]   Signed by:   Deliah Goody, RN on 09/24/2010   Method used:   Faxed to ...       [facility] (mail-order)             , [place]         Ph: [phone]       Fax: [phone]   RxID:   [id number]

## 2010-09-30 NOTE — Progress Notes (Signed)
Phone Note Refill Request Message from:  Fax from Pharmacy on September 21, 2010 9:03 AM  Refills Requested: Medication #1:  DILTIAZEM HCL ER BEADS 300 MG XR24H-CAP 1 by mouth once daily.   Dosage confirmed as above?Dosage Confirmed   [facility]: [facility]  Medication #2:  GLIMEPIRIDE 1 MG  TABS 1 by mouth once daily   Dosage confirmed as above?Dosage Confirmed   [facility]: [facility]  Medication #3:  KLOR-CON M20 20 MEQ TBCR Take 1 tablet by mouth once a day   Dosage confirmed as above?Dosage Confirmed   [facility]: [facility]  Medication #4:  ALLOPURINOL 100 MG TABS 1 tablet by mouth once a day   Dosage confirmed as above?Dosage Confirmed   [facility]: [facility] Initial call taken by: Robin Ewing CMA (AAMA),  September 21, 2010 9:04 AM    Prescriptions: DILTIAZEM HCL ER BEADS 300 MG XR24H-CAP (DILTIAZEM HCL ER BEADS) 1 by mouth once daily  #90 x 3   Entered by:   Scharlene Gloss CMA (AAMA)   Authorized by:   Corwin Levins MD   Signed by:   Scharlene Gloss CMA (AAMA) on 09/21/2010   Method used:   Faxed to ...       [facility])       [address] [address]       [place], [place]  [address]       Ph: [phone]       Fax: [phone]   RxID:   [id number] GLIMEPIRIDE 1 MG  TABS (GLIMEPIRIDE) 1 by mouth once daily  #90 x 3   Entered by:   Scharlene Gloss CMA (AAMA)   Authorized by:   Corwin Levins MD   Signed by:   Scharlene Gloss CMA (AAMA) on 09/21/2010   Method used:   Faxed to ...       [facility])       [address] [address]       [place], [place]  [address]       Ph: [phone]       Fax: [phone]   RxID:   [id number] KLOR-CON M20 20 MEQ TBCR (POTASSIUM CHLORIDE CRYS CR) Take 1 tablet by mouth once a day  #90 x 3   Entered by:   Zella Ball Ewing CMA (AAMA)   Authorized by:   Corwin Levins MD   Signed by:   Scharlene Gloss CMA (AAMA) on 09/21/2010   Method used:   Faxed to ...       [facility] education [facility])       [address] [address]       [place], [place]  [address]  Ph: [phone]       Fax: [phone]   RxID:   [id number] LISINOPRIL 40 MG TABS (LISINOPRIL) 1po once daily  #90 x 3   Entered by:   Zella Ball Ewing CMA (AAMA)   Authorized by:   Corwin Levins MD   Signed by:   Scharlene Gloss CMA (AAMA) on 09/21/2010   Method used:   Faxed to ...       [facility] education [facility])       [address] [address]       [place], [place]  [address]       Ph: [phone]       Fax: [phone]   RxID:   [id number] ALLOPURINOL 100 MG TABS (ALLOPURINOL) 1 tablet by mouth once a day  #90 x 3   Entered by:   Zella Ball Ewing CMA (AAMA)  Authorized by:   Corwin Levins MD   Signed by:   Scharlene Gloss CMA (AAMA) on 09/21/2010   Method used:   Faxed to ...       [facility] education [facility])       [address] [address]       [place], [place]  [address]       Ph: [phone]       Fax: [phone]   RxID:   [id number]

## 2010-09-30 NOTE — Progress Notes (Signed)
  Phone Note Refill Request Message from:  Fax from Pharmacy on September 24, 2010 10:33 AM  Refills Requested: Medication #1:  SIMVASTATIN 40 MG TABS Take 1 tablet by mouth once a day   Dosage confirmed as above?Dosage Confirmed   Notes: [facility] Initial call taken by: Robin Ewing CMA (AAMA),  September 24, 2010 10:33 AM    Prescriptions: SIMVASTATIN 40 MG TABS (SIMVASTATIN) Take 1 tablet by mouth once a day  #90 x 3   Entered by:   Scharlene Gloss CMA (AAMA)   Authorized by:   Corwin Levins MD   Signed by:   Scharlene Gloss CMA (AAMA) on 09/24/2010   Method used:   Faxed to ...       [facility] (mail-order)       [id number]       [place], [place]  [id number]       Ph:        Fax: [phone]   RxID:   [id number]

## 2010-09-30 NOTE — Procedures (Signed)
Summary: summary  summary   Imported By: Mirna Mires 09/25/2010 16:10:27  _____________________________________________________________________  External Attachment:    Type:   Image     Comment:   External Document

## 2010-10-09 ENCOUNTER — Telehealth: Payer: Self-pay | Admitting: Cardiology

## 2010-10-15 NOTE — Progress Notes (Signed)
Summary: new refill   Phone Note Call from Patient   Summary of Call: diltiazem was to be lowered to 240mg /pharmacy needs new rx faxed to fax [phone]/cover sheet with pt name. dob ,address Initial call taken by: Glynda Jaeger,  October 09, 2010 3:27 PM  Follow-up for Phone Call        already sent to pharamcy 09-24-10 called pt and told them Follow-up by: Kem Parkinson,  October 09, 2010 3:51 PM

## 2010-10-23 ENCOUNTER — Encounter: Payer: Self-pay | Admitting: Cardiology

## 2010-10-23 ENCOUNTER — Encounter (INDEPENDENT_AMBULATORY_CARE_PROVIDER_SITE_OTHER)

## 2010-10-23 DIAGNOSIS — Z7901 Long term (current) use of anticoagulants: Secondary | ICD-10-CM

## 2010-10-23 DIAGNOSIS — I4891 Unspecified atrial fibrillation: Secondary | ICD-10-CM

## 2010-10-27 NOTE — Medication Information (Signed)
Summary: coumadin ck/mt  Anticoagulant Therapy  Managed by: Bethena Midget, RN, BSN Referring MD: Olga Millers MD PCP: Dr. Gwen Pounds MD: Daleen Squibb MD, Maisie Fus Indication 1: Atrial Fibrillation (ICD-427.31) Lab Used: [facility]  Site: [facility] INR POC 3.1 INR RANGE 2 - 3  Dietary changes: no    Health status changes: no    Bleeding/hemorrhagic complications: no    Recent/future hospitalizations: no    Any changes in medication regimen? no    Recent/future dental: no  Any missed doses?: no       Is patient compliant with meds? yes       Allergies: No Known Drug Allergies  Anticoagulation Management History:      The patient is taking warfarin and comes in today for a routine follow up visit.  Positive risk factors for bleeding include history of GI bleeding and presence of serious comorbidities.  Negative risk factors for bleeding include an age less than 52 years old.  The bleeding index is 'intermediate risk'.  Positive CHADS2 values include History of HTN and History of Diabetes.  Negative CHADS2 values include Age > 52 years old.  The start date was 02/09/2008.  His last INR was 3.6 RATIO.  Anticoagulation responsible provider: Daleen Squibb MD, Maisie Fus.  INR POC: 3.1.  Cuvette Lot#: [id number].  Exp: 10/2011.    Anticoagulation Management Assessment/Plan:      The patient's current anticoagulation dose is Warfarin sodium 5 mg tabs: use asd.  The target INR is 2 - 3.  The next INR is due 11/06/2010.  Anticoagulation instructions were given to patient.  Results were reviewed/authorized by Bethena Midget, RN, BSN.  He was notified by Bethena Midget, RN, BSN.         Prior Anticoagulation Instructions: INR 3.3  Coumadin 5 mg tablets - Take 1/2 tablet today (1/31), then take 1 tablet every day except 1/2 tablet on Mondays and Fridays   Current Anticoagulation Instructions: INR 3.1 On Saturday only take 1/2 pill then change dose to 1 pill everyday except 1/2 pill on Mondays, Wednesdays  and Fridays. Recheck in 2-3 weeks.

## 2010-11-05 ENCOUNTER — Other Ambulatory Visit: Payer: Self-pay

## 2010-11-05 MED ORDER — FUROSEMIDE 80 MG PO TABS: 80.0000 mg | ORAL_TABLET | Freq: Two times a day (BID) | ORAL | Status: DC

## 2010-11-05 NOTE — Telephone Encounter (Signed)
[facility] requesting refill on patients Furosemide

## 2010-11-06 ENCOUNTER — Ambulatory Visit (INDEPENDENT_AMBULATORY_CARE_PROVIDER_SITE_OTHER): Admitting: *Deleted

## 2010-11-06 DIAGNOSIS — Z7901 Long term (current) use of anticoagulants: Secondary | ICD-10-CM

## 2010-11-06 DIAGNOSIS — I4891 Unspecified atrial fibrillation: Secondary | ICD-10-CM

## 2010-11-27 ENCOUNTER — Ambulatory Visit (INDEPENDENT_AMBULATORY_CARE_PROVIDER_SITE_OTHER): Admitting: *Deleted

## 2010-11-27 DIAGNOSIS — I4891 Unspecified atrial fibrillation: Secondary | ICD-10-CM

## 2010-11-27 LAB — POCT INR: INR: 2.1

## 2010-12-18 ENCOUNTER — Other Ambulatory Visit: Payer: Self-pay

## 2010-12-18 ENCOUNTER — Inpatient Hospital Stay (INDEPENDENT_AMBULATORY_CARE_PROVIDER_SITE_OTHER)
Admission: RE | Admit: 2010-12-18 | Discharge: 2010-12-18 | Disposition: A | Discharge status: Home / Self Care | Payer: Medicare Other | Source: Ambulatory Visit

## 2010-12-18 DIAGNOSIS — L02219 Cutaneous abscess of trunk, unspecified: Secondary | ICD-10-CM

## 2010-12-21 ENCOUNTER — Other Ambulatory Visit: Payer: Self-pay | Admitting: Internal Medicine

## 2010-12-22 ENCOUNTER — Other Ambulatory Visit (INDEPENDENT_AMBULATORY_CARE_PROVIDER_SITE_OTHER): Payer: Medicare Other

## 2010-12-22 LAB — LIPID PANEL
Cholesterol: 131 mg/dL (ref 0–200)
LDL Cholesterol: 68 mg/dL (ref 0–99)
Triglycerides: 129 mg/dL (ref 0.0–149.0)
VLDL: 25.8 mg/dL (ref 0.0–40.0)

## 2010-12-22 LAB — BASIC METABOLIC PANEL
Chloride: 101 mEq/L (ref 96–112)
GFR: 80.63 mL/min (ref >60.00)
Potassium: 4.4 mEq/L (ref 3.5–5.1)

## 2010-12-22 NOTE — Procedures (Signed)
NAME:  [name] [name], [name] [name]                     ACCOUNT NO.:  [id number]   MEDICAL RECORD NO.:  [id number]          PATIENT TYPE:  OUT   LOCATION:  SLEEP CENTER                 FACILITY:  [facility]   PHYSICIAN:  Coralyn Helling, MD        DATE OF BIRTH:  1946-05-20   DATE OF STUDY:  06/18/2008                            NOCTURNAL POLYSOMNOGRAM   REFERRING PHYSICIAN:  Coralyn Helling, MD   DATE OF TEST:  June 18, 2008.   FACILITY:  [facility].   REFERRING PHYSICIAN:  Coralyn Helling, MD   INDICATION:  [name] is a 65 year old male with a history of atrial  fibrillation, hypertension, diabetes. He also has symptoms of sleep  disruption with excessive daytime sleepiness.  He is referred to the  Sleep Lab for evaluation of hypersomnia with obstructive sleep apnea.   Height is 5 feet 8 inches, weight is 259 pounds, BMI is 39, and neck  size is 18 inches.   MEDICATIONS:  Lisinopril, potassium, Lasix, simvastatin, glimepiride,  metoprolol, Coumadin, allopurinol, and diltiazem.   EPWORTH SCORE:  11.   SLEEP ARCHITECTURE:  The patient followed a split night study protocol.   During the diagnostic phase, total recording time was 218 minutes, total  sleep time was 132 minutes, sleep efficiency was 60%, sleep latency was  11 minutes, and REM latency was 102 minutes.  This portion of the study  was notable for lack of slow wave sleep and the patient slept  exclusively in the supine position.  Moderate snoring was noted by the  technician.   During the therapeutic portion of the test, total recording time was 181  minutes, total sleep time was 114 minutes, sleep efficiency was 63%,  sleep latency was 15 minutes, and REM latency was 11 minutes.  This  portion of the study was notable for lack of slow wave sleep and the  patient slept exclusively in the supine position.   RESPIRATORY DATA:  The average respiratory rate was 19.   During the diagnostic portion of the test, the overall apnea-hypopnea  index was 64.  The events were exclusively obstructive in nature.  The  REM apnea-hypopnea index was 94.  The non REM apnea-hypopnea index was  63.   During the therapeutic portion of the test the patient was titrated from  a CPAP pressure setting of 6 to 17 cm of water.  At a CPAP pressure  setting of 13.  The patient appeared to have a good control over his  respiratory events and at this pressure setting he was reviewed in both  REM sleep and supine sleep.  Of note, with high pressures, he had  difficulties maintaining sleep.   CARDIAC DATA:  The average heart rate was 67 and the rhythm strip showed  atrial fibrillation.   OXYGEN DATA:  The baseline oxygenation was 96%.  The oxygen saturation  nadir was 75%.  The patient appeared to have reasonable control of his  oxygen level at a CPAP pressure setting of 13.  The study was conducted  without the use of supplemental oxygen.   MOVEMENT/PARASOMNIA:  The periodic  limb movement index is zero.  The  patient had 3 resting trips.   IMPRESSION:  This study shows evidence of severe episodes of sleep apnea  as demonstrated by apnea-hypopnea index of 54 with an oxygen saturation  nadir of 74%.   He was titrated to continuous positive airway pressure setting of 13 cm  water with good control of his respiratory events.  He was observed in  both REM sleep and supine sleep at his pressures having.   In addition to counseling with regards to of course diet, exercise,  weight reduction, the patient should be started on continuous positive  airway pressure with 13 cm water and monitored for his clinical  response.      Coralyn Helling, MD  Diplomat, American Board of Sleep Medicine  Electronically Signed     VS/MEDQ  D:  07/01/2008 16:07:58  T:  07/02/2008 04:31:17  Job:  [id number]

## 2010-12-22 NOTE — Procedures (Signed)
[name] [name], [name] [name]                     ACCOUNT NO.:  [id number]   MEDICAL RECORD NO.:  [id number]          PATIENT TYPE:  OUT   LOCATION:  SLEEP CENTER                 [facility]ACILITY:  [facility]   PHYSICIAN:  Coralyn Helling, MD        DATE OF BIRTH:  [birth year]   DATE OF STUDY:  11/26/2008                            NOCTURNAL POLYSOMNOGRAM   REFERRING PHYSICIAN:  Coralyn Helling, MD   INDICATION FOR STUDY:  [name] [name] is a 66 year old male, who had a  diagnostic polysomnogram done on June 18, 2008.  He was found to  have severe obstructive sleep apnea with an apnea-hypopnea index of 54.  He had failed outpatient CPAP auto titration.  He returns to the sleep  lab for a CPAP titration study.   EPWORTH SLEEPINESS SCORE:  Height is 5 feet 8 inches.  Weight is 216  pounds.  BMI is 40.  Neck size 18 inches.  Epworth score is 12.   MEDICATIONS:  Lasix, glimepiride, simvastatin, diltiazem, Coumadin,  lisinopril, potassium, allopurinol, and metoprolol.   SLEEP ARCHITECTURE:  Total recording time was 455 minutes.  Total sleep  time was 305 minutes.  Sleep efficiency was 67%.  Sleep latency was 80  minutes.  REM latency was 94 minutes.  The patient slept exclusively in  the supine position.   RESPIRATORY DATA:  The average respiratory was 24.  The patient was  titrated from a CPAP pressure setting of 5 to 17 cm of water.  The  patient continue to appear to have respiratory events in spite of use of  CPAP therapy, some of which appeared to be obstructive hypopneas and  some of which appeared to be central hypopneas.  In addition, he had  continued oxygen desaturation in the absence of respiratory events.   OXYGEN DATA:  The baseline oxygenation was 98%.  The oxygen saturation  nadir was 72%.  Again, the patient appeared to have oxygen desaturations  both with and without respiratory events.   CARDIAC DATA:  The average heart rate was 80 and rhythm strip showed  atrial fibrillation with frequent PVCs and a 9  beat run of ventricular  tachycardia.   MOVEMENT-PARASOMNIA:  The periodic limb movement index was 0.  The  patient had 2 rest room trips.   IMPRESSIONS-RECOMMENDATIONS:  The patient continued to have respiratory  events in spite of the use of CPAP of 17 cm of water.  In addition, he  had oxygen desaturations both with and without respiratory events which  were consistent with hypoventilation in addition to his obstructive  sleep apnea.   What I would recommend is to start the patient on BiPAP therapy.  He  should then undergo an overnight oximetry and depending upon his  response to this, he may need to return to the sleep lab again for a  additional titration study, but this time using BiPAP therapy.      Coralyn Helling, MD  Diplomat, American Board of Sleep Medicine  Electronically Signed     VS/MEDQ  D:  12/04/2008 12:34:53  T:  12/05/2008 01:47:10  Job:  [id number]

## 2010-12-22 NOTE — Assessment & Plan Note (Signed)
[facility]                            CARDIOLOGY OFFICE NOTE   NAME:Simmerman, DAVONTAE PRUSINSKI                            MRN:          [id number]  DATE:05/02/2008                            DOB:          [birth year]    Mr. Manolis is a gentleman that has a history of hypertension,  hyperlipidemia and diabetes mellitus.  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  Prior to  the surgery, he was noted to be in atrial fibrillation and we were asked  to evaluate during that hospitalization.  An echocardiogram was  performed that showed normal LV function.  There was mild RVE and RAE.  His TSH apparently was normal.  He also has had a Myoview on March 08, 2008, that showed an ejection fraction of 54%.  No ischemia or  infarction.  He has been on rate control on anticoagulation.  We  recently brought the patient to the hospital and attempted  cardioversion.  The initial attempt was with 150 joules and he had 2  subsequent attempts at 200 joules.  Neither of these were successful.  Since that time, he is doing reasonable well.  He does have dyspnea on  exertion which is worse compared to prior to his hospitalization.  However, he thinks this may be related to deconditioning versus lung  disease.  He has not had orthopnea or PND, and his pedal edema is at  baseline or improved.  He has not had chest pain, palpitations or  syncope.  Note, there has been no associated cough or hemoptysis with  his dyspnea and it has been present ever since his hospitalization.   PRESENT MEDICATIONS:  Include.  1. Potassium 20 mEq p.o. daily.  2. Zocor 40 mg p.o. daily.  3. Allopurinol 100 mg p.o. daily.  4. Glimepiride 1 mg tablet one and half p.o. daily.  5. Lisinopril 10 mg p.o. daily.  6. Lasix 40 mg p.o. b.i.d.  7. Lopressor 50 mg p.o. b.i.d.  8. Cardizem CD 360 mg p.o. daily.  9. Coumadin as directed and prednisone.   PHYSICAL EXAM:  VITAL  SIGNS:  Blood pressure of 137/75 and his pulse is  88.  He weighs 260 pounds.  HEENT:  Normal.  NECK:  Supple.  CHEST:  Clear.  CARDIOVASCULAR:  Reveals an irregular rhythm.  ABDOMEN:  No tenderness.  EXTREMITIES:  Show trace edema and chronic skin changes.   DIAGNOSTIC DATA:  His electrocardiogram today shows atrial fibrillation  at a rate of 86.  The axis is normal.  There are nonspecific ST changes.   DIAGNOSES:  1. Atrial fibrillation - Mr. Higginson continues to be in atrial      fibrillation.  It is not clear to me whether his arrhythmias      contributing to his increased dyspnea on exertion.  However,      certainly could be.  Other possibilities including obesity,      hypoventilation syndrome as well as deconditioning.  There also may  be a component of chronic obstructive pulmonary disease.  I have      discussed the options with he and his wife today concerning his      atrial fibrillation.  These include rate control and      anticoagulation verses further attempts at cardioversion.  Note,      there is also a complicating factor and that his work is with sheet      metal and he will require Coumadin.  If he does require Coumadin      then he will need to discontinue his employment as it will be      contraindicated with his type of work.  We have decided to ask him      to be seen by Dr. Johney Frame.  Possibilities include reattempts at      cardioversion with an antiarrhythmic versus consideration of atrial      fibrillation ablation with ultimate discontinuation of Coumadin so      that he may return to employment.  He and his family are in      agreement with this and we will arrange an appointment to see Dr.      Johney Frame in the near future.  For now, he will continue on his      Cardizem as well as his Lopressor for rate control and he will      continue on Coumadin with goal INR of two to three.  Note, he has a      CHADS2 score of 2 for hypertension and diabetes  mellitus.  2. Hypertension - his blood pressure is on adequate control on his      present medications.  3. Diabetes mellitus.  4. Hyperlipidemia - he will continue on his statin and this is being      followed by his primary care physician.  5. History of gout.   We will have him return in 3 months.  Otherwise, in the time, we will  have him see Dr. Johney Frame.     Madolyn Frieze Jens Som, MD, [facility]  Electronically Signed    BSC/MedQ  DD: 05/02/2008  DT: 05/02/2008  Job #: [id number]

## 2010-12-22 NOTE — Op Note (Signed)
NAME:  [name] [name], [name] [name]                     ACCOUNT NO.:  [id number]   MEDICAL RECORD NO.:  [id number]          PATIENT TYPE:  INP   LOCATION:  5504                         FACILITY:  [facility]   PHYSICIAN:  Johnette Abraham, MD    DATE OF BIRTH:  06/11/1946   DATE OF PROCEDURE:  02/08/2008  DATE OF DISCHARGE:                               OPERATIVE REPORT   PREOPERATIVE DIAGNOSIS:  Complex laceration to the right ring finger.   POSTOPERATIVE DIAGNOSES:  1. Complex laceration to the right ring finger.  2. Flexor digitorum profundus laceration in zone II.   PROCEDURES:  1. Exploration of the wound.  2. Repair of the flexor digitorum profundus in zone II.  3. Pulley reconstruction.   SURGEON:  Johnette Abraham, MD   ASSISTANT:  None.   ANESTHESIA:  General.   FINDINGS:  A 100% laceration to the FDP in zone II.  The neurovascular  bundles were intact.   SPECIMENS:  None.   ESTIMATED BLOOD LOSS:  Minimal.   TOURNIQUET TIME:  59 minutes.   DISPOSITION:  Taken to PACU in stable condition.   INDICATIONS:  Mr. Sawaya is a 65 year old gentleman who cut his hand on  some sheet metal yesterday at work, presented to the Urgent Care and was  referred to me for evaluation.  The patient could not flex his ring  finger and therefore a tendon injury was suspected.  Risks, benefits,  and alternatives of surgery were discussed.  The patient agreed to  proceed with exploration and repair.  The patient was taken to the  operating room.  Of note, preoperatively, the patient was noted to be in  atrial fibrillation.  This was a new finding.   PROCEDURE IN DETAIL:  The patient was taken to the operating room,  placed supine on the operating room table.  General anesthesia was  administered.  Arm was elevated and exsanguinated.  A Tourniquet was  used.  The hand was prepped and draped in normal sterile fashion.  The  old sutures in the wound were removed.  The wound was briefly explored,  and it was evident  that there was a tendon laceration.  Therefore, a  zigzag incision was made in the ring finger for adequate exposure.  Skin  and subcutaneous flaps were created, and dissection was carried down on  top of the flexor tendon sheath.  The laceration was just proximal to  the A4 pulley.  The proximal portion of the tendon had retracted and  therefore the cruciate pulley and tendon sheath had to be opened to gain  access to the proximal portion.  Additionally, the A4 pulley had to be  incised in a stepwise fashion to gain access to the distal flexor  tendon.  Both ends of the tendon were grasped and held in place with a  22-gauge needle.  Following with a 4-0 FiberWire suture, a modified  Bunnell stitch was placed followed by a running 5-0 Prolene  circumferentially around the tendon.  The repair seemed adequate and  compact.  General range of  motion of the finger revealed the tendon to  remain intact , and glide freely.  Following, each neurovascular bundle  was evaluated and found to be intact.  Following, the stepwise incisions  in both the tendon sheath and the A4 pulley were reapproximated and  sutured together with several interrupted 4-0 FiberWire sutures.  Again  gentle range of motion did not reveal any hanging of the tendon on the  pulley.  Following, the wound was irrigated and closed with multiple  interrupted 5-0 Prolene and 5-0 nylon sutures.  Tourniquet was released.  Finger tip returned to nice pink color.  A Xeroform, a sterile dressing,  and a dorsal block splint were placed.  The patient was awakened from  anesthesia and was taken to recovery room in stable condition.  He will  be seen by [facility] for his atrial fibrillation.      Johnette Abraham, MD  Electronically Signed     HCC/MEDQ  D:  02/08/2008  T:  02/09/2008  Job:  [id number]

## 2010-12-22 NOTE — Assessment & Plan Note (Signed)
[facility]   NAMELANDIS, DOWDY                     ACCOUNT NO.:  [id number]   MEDICAL RECORD NO.:  [id number]      DATE OF BIRTH:  [birth year]   PHYSICIAN:  Madolyn Frieze. Jens Som, MD, [facility] VISIT DATE:  04/22/2008                                   OFFICE VISIT   This is cardioversion of atrial fibrillation.  The patient was sedated  by Anesthesia with Pentothal 150 mg intravenously.  Synchronized  cardioversion with 150 joules (biphasic) resulted in atrial  fibrillation.  Subsequent attempts with 200 joules twice continued to  reveal atrial fibrillation.  This procedure was therefore not successful  in establishing a sinus rhythm.  We will plan rate control  anticoagulation.      Madolyn Frieze Jens Som, MD, [facility]  Electronically Signed     BSC/MEDQ  D:  04/22/2008  T:  04/23/2008  Job:  [id number]

## 2010-12-22 NOTE — Consult Note (Signed)
NAME:  [name] [name], [name] [name]                     ACCOUNT NO.:  [id number]   MEDICAL RECORD NO.:  [id number]          PATIENT TYPE:  INP   LOCATION:  5504                         FACILITY:  [facility]   PHYSICIAN:  Darryl D. Prime, MD    DATE OF BIRTH:  1946-06-23   DATE OF CONSULTATION:  02/08/2008  DATE OF DISCHARGE:                                 CONSULTATION   PRIMARY CARE PHYSICIAN:  Corwin Levins, M.D.   I am consulting on this patient for atrial fibrillation with rapid  ventricular response in the operating room, since rate controlled.  The  consultation physician will be Madolyn Frieze. Jens Som, MD, [facility].   HISTORY OF PRESENT ILLNESS:  This is a 65 year old male with a history  of hypertension, hyperlipidemia, diabetes who denies any history of  atrial fibrillation or palpitations who was working with metal yesterday  and cut his right ring finger with a tendon apparently injured.  He went  to surgery today for repair.  Preoperative EKG showed atrial  fibrillation, an event rate of 88, QRS 92, and the patient is not sure  of the last time he had an EKG.  The patient's heart rate perioperative  was in the range of 70s to 140s, in the 90s now.  He denies any chest  pain, palpitations, or shortness of breath in the recent past or  currently.  He noticed chronic lower extremity edema.  We were called  for management of atrial fibrillation of a new diagnosis.   PAST MEDICAL HISTORY:  As above.  He also has a history of gout,  obesity, hypokalemia, chronic lower extremity edema.  The patient has a  history of diabetes for a year and a half and hypertension for a very  long time.  He is not sure of how long.   ALLERGIES:  No known drug allergies.   MEDICATIONS:  1. Diltiazem 300 mg long acting daily.  2. Lasix 40 mg twice a day.  3. Clonidine 0.2 mg twice a day.  4. Lisinopril 20 mg twice a day.  5. Simvastatin 40 mg daily.  6. Allopurinol 100 mg daily.  7. Aspirin 81 mg daily.  8.  Glipizide daily, unsure of dose.  9. K-Dur 20 mEq daily.   SOCIAL HISTORY:  He has tobacco abuse 15-year history, this ensued 30  years ago.  Denies any alcohol or illicit drug use.  He currently works  as a Hotel manager.   FAMILY HISTORY:  Brother had heart disease.  He is unsure of what type.  It is an older brother and he is unsure of when or at what age his  brother had heart disease.  Diabetes also runs in the family.   REVIEW OF SYSTEMS:  A 14-point review of systems is negative unless  stated above.  He does have a history of hemorrhoids with occasional  mild bleeding or blood seen when he wipes.   PHYSICAL EXAMINATION:  VITAL SIGNS:  The patient is afebrile with a  blood pressure of 130/70, saturations are 97% on 2-4  liters, pulse 90,  respiratory rate 20.  GENERAL:  He is an obese male lying flat in bed in no acute distress.  HEENT:  Normocephalic and atraumatic.  His pupils equal, round, and  reactive to light.  Extraocular movements intact.  Jugular venous  distention is difficult to assess due to body habitus.  LYMPHS:  No lymphadenopathy and no thyromegaly.  LUNGS:  Decreased breath sounds at the bases with no crackles.  CARDIOVASCULAR:  Irregularly irregular with normal rate and no murmurs.  EXTREMITIES:  The dorsalis pedis pulses are 1+ bilaterally.  The  patient's radials are 2+ on the left side.  He has a right bandage over  the wrist and apparent brace.  The patient's extremities show chronic  venous stasis changes with 1-2+ lower extremity edema bilaterally.  NEUROLOGY:  Alert and oriented x4.  Cranial nerves II-XII grossly  intact.  Strength and sensation grossly intact.   EKG is as above.  He had a chest x-ray showing bibasilar atelectasis.   LABORATORY DATA:  Sodium 140 with potassium of 3.4, chloride 104, bicarb  28, BUN 13, creatinine 0.89, glucose 90. White count 16.8, hemoglobin  14.4, hematocrit 42.7, platelets 357.   ASSESSMENT:  This is a patient  with a history of newly diagnosed atrial  fibrillation with no symptoms and he may have had atrial fibrillation  for quite some time and it is probably not safe for cardioversion due to  possible increased risk of stroke peri cardioversion.  It is suggested  that he be continued on Diltiazem 300 LA giving the first dose now, I  will order that.  We should get an echocardiogram in the morning to rule  out structural heart disease, I will order that.  I will check a TSH,  free T4, and cardiac markers x2 and we will replete his potassium.  He  has a CHAD2 score of 2, giving him a 4% risk of stroke, but we are  unsure of his LV function.  This is moderate risk and Coumadin possibly  is most likely indicated.  Dr. Jens Som will see him in the morning on  that decision.  It is also suggested that urinalysis be checked as his  white count is significantly elevated.      Darryl D. Prime, MD  Electronically Signed     DDP/MEDQ  D:  02/09/2008  T:  02/09/2008  Job:  [id number]   cc:   Corwin Levins, MD  Johnette Abraham, MD

## 2010-12-22 NOTE — Op Note (Signed)
NAMEEYOB, GODLEWSKI                     ACCOUNT NO.:  [id number]   MEDICAL RECORD NO.:  [id number]          PATIENT TYPE:  OIB   LOCATION:  2899                         FACILITY:  [facility]   PHYSICIAN:  Hillis Range, MD       DATE OF BIRTH:  [birth year]   DATE OF PROCEDURE:  DATE OF DISCHARGE:                               OPERATIVE REPORT   PREPROCEDURE DIAGNOSIS:  Persistent atrial fibrillation.   POSTPROCEDURE DIAGNOSES:  Persistent atrial fibrillation.   PROCEDURES:  Direct current cardioversion.   DESCRIPTION OF THE PROCEDURE:  Informed written consent was obtained and  the patient was brought to the cath lab in the fasting state.  The  patient's INR was confirmed to be therapeutic.  An IV access and  adequate airway support were assured.  The patient was then adequately  sedated with 5 mg of intravenous Versed.  The patient was noted to be in  atrial fibrillation upon presentation.  He was successfully cardioverted  to sinus rhythm with a single synchronized 360 joules biphasic shock  with cardioversion electrodes in the anterior posterior thoracic  configuration.  He remained in sinus rhythm thereafter.  There were no  early apparent complications.   CONCLUSIONS:  1. Persistent atrial fibrillation.  2. Successful cardioversion to sinus rhythm.  3. No early apparent complications.      Hillis Range, MD  Electronically Signed     JA/MEDQ  D:  05/21/2008  T:  05/21/2008  Job:  [id number]   cc:   Madolyn Frieze. Jens Som, MD, [facility]

## 2010-12-22 NOTE — Procedures (Signed)
NAMEDIAZ, CRAGO                     ACCOUNT NO.:  [id number]   MEDICAL RECORD NO.:  [id number]          PATIENT TYPE:  OUT   LOCATION:  SLEEP CENTER                 FACILITY:  [facility]   PHYSICIAN:  Coralyn Helling, MD        DATE OF BIRTH:  06-22-1946   DATE OF STUDY:  01/28/2009                            NOCTURNAL POLYSOMNOGRAM   REFERRING PHYSICIAN:   REFERRING PHYSICIAN:  Coralyn Helling, MD   INDICATION FOR STUDY:  [name] is a 65 year old male, who had a initial  overnight polysomnogram done on June 18, 2009 which showed severe  obstructive sleep apnea with a apnea-hypopnea index of 64.  He was  initially tried on CPAP therapy, but failed to have better control of  his sleep disordered breathing with this.  As a result, he returns to  the sleep lab for a BiPAP titration study.   Height is 5 feet 8 inches, weight is 260 pounds.  BMI is 40.  Neck size  18 inches.   EPWORTH SLEEPINESS SCORE:  14.   MEDICATIONS:  Simvastatin, lisinopril, metoprolol, furosemide,  glimepiride, allopurinol, diltiazem, and potassium.   SLEEP ARCHITECTURE:  Total recording time was 469 minutes.  Total sleep  time was 211 minutes.  Sleep efficiency is 45%.  Sleep latency is 23  minutes.  REM latency is 56 minutes.  The study was notable for lack of  slow wave sleep and a reduction in the percentage of REM sleep.  The  patient slept predominately in the supine position.   RESPIRATORY DATA:  The average respiratory rate was 16.  The patient was  titrated from a BiPAP pressure setting of 8/4 to 20/11.  At a BiPAP  pressure setting of 18/9.  The apnea-hypopnea index was reduced to 9.2.  At this pressure setting, he was observed in REM sleep.   OXYGEN DATA:  The baseline oxygenation was 97%.  The oxygen saturation  nadir was 70% and the BiPAP pressure setting of 18 over 9.  The oxygen  saturation nadir was 87%.  This study was conducted without the use of  supplemental oxygen.   CARDIAC DATA:  The average  heart rate was 63 and the rhythm strip showed  atrial fibrillation.   MOVEMENT-PARASOMNIA:  The patient had 2 restroom trips.  The periodic  limb movement index was 0.   IMPRESSIONS-RECOMMENDATIONS:  This was a suboptimal BiPAP titration  study as the patient continued to have some respiratory events as well  as oxygen desaturations in spite of the use of BiPAP.  It appeared that  he was able to gain reasonable control of his respiratory events with a  BiPAP setting of 18/9.  What I would recommend is to start him on this  pressure setting for his BiPAP with the addition of 2 L of supplemental  oxygen as he does appear to also have obesity hyperventilation syndrome  in addition to his obstructive sleep apnea.  After, he is initiated on  BiPAP and supplemental oxygen, he should then undergo an overnight  oximetry to determine if any further adjustments in his set-up are  needed.  Coralyn Helling, MD  Diplomat, American Board of Sleep Medicine  Electronically Signed     VS/MEDQ  D:  02/10/2009 12:36:51  T:  02/11/2009 00:24:17  Job:  [id number]

## 2010-12-22 NOTE — Assessment & Plan Note (Signed)
[facility]                            CARDIOLOGY OFFICE NOTE   NAME:[name] [name], ALOIS COLGAN                            MRN:          [id number]  DATE:03/04/2008                            DOB:          [birth year]    HISTORY OF PRESENT ILLNESS:  Mr. Donigan is a pleasant 65 year old gentleman  that I recently saw at [facility].  He has history of  hypertension, hyperlipidemia, and diabetes mellitus.  He had been  working with sheet metal and cut his right finger that required surgery.  However, during that admission, he was noted to be in atrial  fibrillation, and this appeared to be preoperatively as well.  We were  therefore asked to further evaluate.  He did have an echocardiogram  performed on February 12, 2008, that showed normal LV function.  There was  mild right ventricular enlargement as well as right atrial enlargement.  His thyroid was normal.  He was continued on his Cardizem.  Lopressor  was added for rate control.  Our thought at that time was that he will  be on Coumadin for 3 weeks followed by cardioversion.  Since discharge,  he denies any dyspnea, chest pain, palpitations, or syncope.  However,  his pedal edema has worsened.   PRESENT MEDICATIONS:  1. Coumadin as directed.  2. Cardizem CD 360 mg p.o. daily.  3. Lopressor 25 mg p.o. b.i.d.  4. Lasix 40 mg p.o. b.i.d.  5. Lisinopril 20 mg p.o. daily.  6. Glimepiride 1.5 mg p.o. daily.  7. Allopurinol 100 mg p.o. daily.  8. Zocor 40 mg p.o. daily.  9. Potassium 20 mEq p.o. daily.   PHYSICAL EXAMINATION:  VITAL SIGNS:  Shows a blood pressure of 146/90.  His pulse is 102.  He weighs 169 pounds.  HEENT:  Normal.  NECK:  Supple.  CHEST:  Clear.  CARDIOVASCULAR:  Irregular rhythm.  ABDOMEN:  Shows no tenderness.  EXTREMITIES:  Show chronic skin changes and 1+ edema.   His electrocardiogram shows atrial fibrillation at a rate of 102.  The  axis is to the right.  There are nonspecific ST  changes.   DIAGNOSES:  1. Atrial fibrillation - the duration Mr. Groninger atrial fibrillation is      unclear.  He was in atrial fibrillation prior to his procedure and      he remains in it today.  He will continue on his Cardizem for rate      control.  Note, it is mildly elevated, and I will increase his      Lopressor to 50 mg p.o. b.i.d. for improved control.  His left      ventricular function and thyroid were normal.  We will plan to      proceed with an outpatient cardioversion once his INR has been      greater than 2-3 consecutive weeks.  He will continue on Coumadin      with his goal INR of 2-3 as outlined above.  Note, if his blood      pressure decreases on the  higher dose of Lopressor, then I have      asked his wife to decrease his lisinopril to 10 mg p.o. daily.  We      will also check a BMET given his diuretic use.  We will also      schedule him to have a Myoview given his multiple risk factors.  2. Hypertension - his blood pressure is mildly elevated, we are      adjusting his regimen as described above.  3. Diabetes mellitus.  4. Hyperlipidemia - he will continue on statin.  This is being managed      by his primary care physician.  5. History of gout.   I will see him back in approximately 8 weeks.  In the meantime, we will  watch for his INRs and cardiovert when therapeutic.     Madolyn Frieze Jens Som, MD, [facility]  Electronically Signed    BSC/MedQ  DD: 03/04/2008  DT: 03/04/2008  Job #: [id number]

## 2010-12-22 NOTE — Letter (Signed)
May 15, 2008    Madolyn Frieze. Jens Som, MD, [facility]  [address]  [place],  [place], [place] [address].   RE:  [name] [name], [name] [name]  MRN:  [id number]  /  DOB:  [birth year]   Dear Arlys John,   It was my pleasure to see your patient, Eaven Schwager, in EP consultation  today for further therapeutic strategies for atrial fibrillation.  As  you will recall, he is a pleasant 65 year old gentleman with a history  of hypertension, hyperlipidemia, and diabetes.  He recently presented to  the hospital following a traumatic injury to his right hand and was  found to be in atrial fibrillation upon arrival.  It is not completely  clear as to whether the patient was in atrial fibrillation prior to his  traumatic event.  He certainly feels that symptoms of dyspnea fatigue  and decreased exercise capacity have been present subsequent to being  diagnosed with atrial fibrillation.  He should not feel that the  symptoms were present previously.  In fact, he reports that prior to his  accident he was able to walk approximately 3 miles per day.  However, at  the present time, he is dyspneic with less than one half mile of  exertion.  Upon presenting with atrial fibrillation and initial workup  revealed a normal left ventricular ejection fraction as well as mild  right atrial enlargement.  However, the left atrial has appeared to be  normal.  A TSH was also normal.  A Myoview scan revealed an ejection  fraction of 64% with no ischemic findings.  He therefore, was brought to  the hospital and cardioversion was attempted at 150 joules as well as  200 joules without success.  He is to remained in atrial fibrillation  with rate control since that time.  He has been initiated on Coumadin  and has tolerated this medication rather well.  As you are aware, the  patient is concerned about long-term effects of Coumadin as he works as  a Chartered certified accountant, and has had multiple injuries in the past.  He denies  significant palpitations but does have  significant decline in his  exercise capacity as noted above which attributes to atrial  fibrillation.  He is not previously tried an antiarrhythmic medication.  He is otherwise without complaint today.   PAST MEDICAL HISTORY:  1. Persistent atrial fibrillation (as above).  2. Hypertension.  3. Diabetes mellitus.  4. Hyperlipidemia.  5. Gout.  6. Morbid obesity.  7. Symptoms of sleep apnea, though he does not carry this diagnosis.   MEDICATIONS:  1. Coumadin 5 mg alternating with 2.5 mg.  2. Cardizem CD 360 mg daily.  3. Metoprolol 50 mg b.i.d.  4. Lasix 40 mg b.i.d.  5. Lisinopril 20 mg one-half tablet daily.  6. Glimepiride 1.5 mg daily.  7. Allopurinol 100 mg daily.  8. Simvastatin 40 mg daily.  9. Potassium chloride 20 mEq daily.   ALLERGIES:  No known drug allergies.  However, the patient reports being  very confused with codeine.   SOCIAL HISTORY:  [place]  He  works as a Chartered certified accountant.  He smoked but quit many years ago and denies  alcohol or drug use.   FAMILY HISTORY:  The patient reports family history of coronary artery  disease, prostate cancer, and breast cancer.   REVIEW OF SYSTEMS:  All systems are reviewed and negative except as  outlined in the HPI above and as documented below.  The patient reports  frequent snoring at night as well as apneic episodes per spouse.  They  feel that he may have sleep apnea.   PHYSICAL EXAMINATION:  VITALS:  Blood pressure is 122/70, heart rate 56,  respirations 18, weight 255 pounds.  GENERAL:  The patient is an obese male in no acute distress, who is  alert and oriented x3.  HEENT:  Normocephalic and atraumatic.  Sclerae clear.  Conjunctivae  pink.  Oropharynx clear.  NECK:  Supple.  No JVD.  No lymphadenopathy or bruits.  LUNGS:  Clear to auscultation bilaterally.  HEART:  Irregularly irregular rhythm.  No murmurs, rubs, or gallops.  GI:  Soft, nontender, and nondistended.   Positive bowel sounds.  EXTREMITIES:  No clubbing, cyanosis, traceable extremity edema  bilaterally.  NEUROLOGIC:  Strength and sensation are intact.  PSYCH:  Euthymic mood, full affect.  SKIN:  No ecchymosis or lacerations.  MUSCULOSKELETAL:  The patient has weakness in his left, fourth and fifth  fingers as well as a healed surgical scar over his recent injury site.   EKG, atrial fibrillation with an average ventricular rate of 56 beats  per minute, otherwise unremarkable.   IMPRESSION:  Mr. Whittley is a very pleasant 65 year old gentleman with  hypertension, hyperlipidemia, diabetes, and persistent atrial  fibrillation who presents today for EP consultation regarding  therapeutic strategies.  Therapeutic strategies for atrial fibrillation  including both medicine and catheter-based therapies were discussed in  detail with the patient today.  The importance of long-term  anticoagulation with Coumadin to prevent stroke was also stressed.  The  patient appears to be clinically symptomatic with dyspnea and decreased  exercise capacity with a rate control therapies alone.  I therefore,  think that he would benefit for rhythm control if this could be  achieved.   PLAN:  I think the best option for the patient at this time would be to  initiate him on an antiarrhythmic medication.  I have taken the liberty  to initiate amiodarone 200 mg twice daily today.  We will need to  decrease the patient's Coumadin, and he will follow up with the Coumadin  clinic this afternoon for their recommendation regarding dosing.  I have  also scheduled the patient to return to the hospital in 1 week for DC  cardioversion by me in the EP lab.  I think that with cardioversion  energies up to 360 joules as well as ibutilide infusion if necessary,  hopefully we should be able to achieve sinus rhythm.  I think that  further therapies thereafter would be directed based on the patient's  ability to achieve and  maintain sinus rhythm.  For able to achieve sinus  rhythm long-term with amiodarone, this would be a reasonable strategy.  I think the patient would be at increased risk for catheter ablation  given his obesity and diabetes.  However, this could be considered down  the road.  Given the patient's persistent atrial fibrillation,  I  suspect that his success rate with catheter ablation would be on the  order of 50-60%.  Based on the symptoms of sleep apnea, I have also  recommend that the patient pursue a sleep study and I have ordered this  today.  He may receive some benefit from CPAP if  sleep apnea is diagnosed.  I appreciate the opportunity to provide  assistance with the care of this patient and will plan to follow him  along with you in the near future.  Sincerely,      Hillis Range, MD  Electronically Signed    JA/MedQ  DD: 05/15/2008  DT: 05/16/2008  Job #: [id number]   CC:   Doylene Canning. Ladona Ridgel, MD  Corwin Levins, MD

## 2010-12-22 NOTE — Discharge Summary (Signed)
NAMEZACORY, [name] [name]                     ACCOUNT NO.:  [id number]   MEDICAL RECORD NO.:  [id number]          PATIENT TYPE:  INP   LOCATION:  0347                         FACILITY:  [facility]   PHYSICIAN:  Johnette Abraham, MD    DATE OF BIRTH:  08-20-1945   DATE OF ADMISSION:  02/08/2008  DATE OF DISCHARGE:  02/13/2008                               DISCHARGE SUMMARY   ADMITTING DIAGNOSES:  1. Complex laceration to the right ring finger with flexor tendon      laceration.  2. Shortness of breath, possible aspiration pneumonia.  3. Irregular heartbeat.   DISCHARGE DIAGNOSES:  1,  Status post repair of the complex laceration  of the right ring finger.  1. Repair of the flexor tendon of the right ring finger.  2. Atrial fibrillation and aspiration pneumonia.   ADMITTING PHYSICIAN:  Harrill C. Izora Ribas, MD   Consultations include [facility], please see their consult note  for details.   HOSPITAL COURSE:  The patient was admitted as an outpatient on February 08, 2008, for repair of his finger laceration.  This went without  difficulty.  Please see operative note for details.  In the recovery  room, the patient had some difficulty with saturations and it was noted  preoperatively that the patient was in atrial fibrillation.  Discussion  with the anesthesiologist were made, and in the patient's best interest,  he was admitted to the hospital overnight.  Overnight, the patient  required increasing oxygen requirements.  Cardiology was consulted as  well as the [facility].  The patient had received workup  for pulmonary embolism, MI, etc.  Please see their consult notes for  full details.  In essence, the patient had atrial fibrillation which was  present prior to surgery.  The patient no evidence of a pulmonary  embolus, no evidence of acute myocardial infarction.  In any regards,  the patient was started on anticoagulation for his atrial fibrillation.  He was started on a brief  course of antibiotics to cover possible  aspiration pneumonia due to an infiltrate on his chest x-ray.  Over the  course of the next couple of days, his anticoagulation was tailored.  His antibiotics were continued, and his respiratory status improved.  As  far as his hand is concerned, there were no issues.  His fingers were  pink and with good sensation.  On the day of discharge, his vitals were  stable.  He was cleared from the other consultants for discharge home  with close followup regarding his anticoagulation and cardiac workup.  As far as I am concerned, he is stable to be discharged from his hand  point of view.   DISCHARGE INSTRUCTIONS:  The patient is nonweightbearing on the right  upper extremity.  He is one-hand duty, left hand only.  He is to  continue previous medications and the medications as prescribed and  recommended by the [facility].  His diet is a American  Heart Association diet.  Activity is as tolerated with the  exception of the  nonweightbearing of the hand.  His condition upon  discharge is improved.  Followup care should be arranged.  He is to see  me 1 week prior to discharge.  He has been given instructions by the  Cardiology Team to follow up with them and with the [facility] for  his anticoagulation.      Johnette Abraham, MD  Electronically Signed     Johnette Abraham, MD  Electronically Signed    HCC/MEDQ  D:  03/15/2008  T:  03/16/2008  Job:  [id number]

## 2010-12-22 NOTE — Assessment & Plan Note (Signed)
OFFICE VISIT   [name] [name]  DOB:  01-06-1946                                       06/08/2010  CHART#:[id number]   REASON FOR VISIT:  Leg swelling.   REFERRING PHYSICIAN:  Frederick A. Terri [name], M.[name].   PRIMARY CARE PHYSICIAN:  [name] [name], M.[name].   HISTORY:  Patient is a 65 year old gentleman I am seeing at the request  of Dr. Terri [name] for evaluation of leg swelling.  The patient states he has  been having progressive swelling for the last 2 years.  It is not  associated with any particular traumatic event or incident.  He has had  several blisters which have opened; however, he has no active  ulceration.  He does complain of significant leg swelling that does  improve with elevation.   The patient has a history of diabetes and hypertension, which are  medically managed.  He does suffer from COPD.  He has a history smoking  but quit in 1978.   REVIEW OF SYSTEMS:  CARDIAC:  Positive for atrial fibrillation.  He is  on Coumadin.  PULMONARY:  Positive for home oxygen.  GI:  Positive for diarrhea.  All other review systems are negative, as documented in the encounter  form.   PAST MEDICAL HISTORY:  Diabetes, hypertension, COPD.   PAST SURGICAL HISTORY:  Hernia repair x2, appendectomy, and hand surgery  (right ring finger).   SOCIAL HISTORY:  He is married with 2 children.  He is a retired Animal nutritionist.  He does not smoke or drink.  Quit smoking in 1978.   FAMILY HISTORY:  Noncontributory.   PHYSICAL EXAMINATION:  Heart rate 76, blood pressure 130/81, O2 sat is  97.  General:  He is well-appearing in no distress.  HEENT:  Within  normal limits.  Respirations are nonlabored.  Cardiovascular:  He has  palpable dorsalis pedis pulses bilaterally.  Abdomen is obese but soft.  Musculoskeletal:  No major deformities.  Neuro:  No focal deficits.  Skin:  He has chronic hyperpigmentation of both lower extremities with  associated edema.   DIAGNOSTIC  STUDIES:  Duplex was performed today which was negative for  DVT and negative for reflux of the saphenofemoral junction.   ASSESSMENT:  Bilateral leg swelling.   PLAN:  The patient with the above ultrasound findings most likely  suffers from chronic lower extremity lymphedema.  However, the  ultrasound did not formally rule out reflux; therefore, I am going to  have him come back in a couple months for a formal reflux evaluation.  In the interim, he needs to be placed in compression stockings.  I have  given him a prescription for 20-30 mm knee-high stockings.  I have told  the patient this is going to be a life-long requirement for him to wear  his stockings in order to prevent worsening edema and skin ulceration.  I will plan on seeing him back in 3 months with an ultrasound and see  how he is doing with his compression therapy.     [name] Ny, MD  Electronically Signed   VWB/MEDQ  [name]:  06/08/2010  T:  06/09/2010  Job:  [id number]   cc:   [name] [name]. [name] [name], M.[name].  [name] Levins, MD

## 2010-12-22 NOTE — Assessment & Plan Note (Signed)
[facility]                            CARDIOLOGY OFFICE NOTE   NAME:[name] [name], [name] [name]                            MRN:          [id number]  DATE:10/16/2008                            DOB:          03-31-1946    HISTORY OF PRESENT ILLNESS:  [name] [name] is a pleasant 65 year old gentleman  who has a history of hypertension, hyperlipidemia, diabetes and now  permanent atrial fibrillation.  He has undergone attempted cardioversion  previously, but he did not hold sinus rhythm.  His most recent Myoview  on March 08, 2008 showed an ejection fraction of 54% and normal  perfusion.  A previous echocardiogram in July 2009 showed normal LV  function, mild RVE, and mild right atrial enlargement.  When I last saw  him, we scheduled him to have a Holter monitor to make sure that his  rate was adequately control.  He was having occasional bradycardia.  We  therefore decreased his Cardizem CD to 300 mg p.o. daily.  Since he does  have some dyspnea.  This occurs with more extreme activities, but not  with routine activities.  There is no orthopnea or PND.  His pedal edema  is at baseline.  His shortness breath is relieved promptly with rest.  He also has an occasional chest tightness.  He notice this predominantly  at night when he lies back.  It lasts for seconds.  It improves with  changing positions.  It does not radiate.  There is no associated  nausea, vomiting, shortness breath, or diaphoresis.  Note, he does not  have exertional chest pain.   PRESENT MEDICATIONS:  Include  1. Lisinopril 10 mg p.o. b.i.d.  2. Cardizem 300 mg p.o. daily.  3. Potassium 20 mEq p.o. daily.  4. Zocor 40 mg daily.  5. Allopurinol 100 mg daily.  6. Glimepiride.  7. Lasix 40 mg p.o. b.i.d.  8. Coumadin as directed.  9. Metoprolol 50 mg p.o. b.i.d.   PHYSICAL EXAMINATION:  VITAL SIGNS:  Today shows a blood pressure of  120/80 and his pulse is 93.  He weighs 258 pounds.  HEENT:  Normal.  NECK:  Supple with no bruits.  CHEST:  Clear.  CARDIOVASCULAR:  Irregular rhythm.  ABDOMEN:  Shows no tenderness.  EXTREMITIES:  Show chronic skin changes.   His electrocardiogram shows atrial fibrillation at a rate of 95.  The  axis is normal.  There are no significant ST changes noted.   DIAGNOSES:  1. Atrial fibrillation now permanent - We will continue with his      Cardizem and Lopressor for rate control.  He will also continue on      Coumadin with goal INR of 2-3 as he has embolic risk factors of      diabetes and hypertension.  If he has worsening symptoms in the      future then we would consider referral back to Dr. Johney Frame for      consideration of ablation, although the patient prefers rate-      controlled anticoagulation at this point.  2.  Hypertension - His blood pressure is adequately controlled on his      present medications.  3. Edema - He will continue on his Lasix.  I will check BMET today to      follow his potassium and renal function.  4. History of elevated white blood cell count - We did repeat this      back in December and it was 10.5, which was the upper end of      normal.  I will repeat this today.  If it is elevated then he will      need to see Hematology.  5. Diabetes mellitus.  6. Chest pain - The patient has had some vague chest pain.  However,      it is atypical and he had a Myoview in July 2009, which showed no      ischemia.  I do not think we need to pursue this further at this      point.  7. Hyperlipidemia - He will continue on the statin.  8. History of gout.   He will see [place] back in 6 months.     Madolyn Frieze Jens Som, MD, [facility]  Electronically Signed    BSC/MedQ  DD: 10/16/2008  DT: 10/16/2008  Job #: [id number]

## 2010-12-22 NOTE — Assessment & Plan Note (Signed)
[facility]                            CARDIOLOGY OFFICE NOTE   NAME:[name] [name] REINHEIMER                            MRN:          [id number]  DATE:07/25/2008                            DOB:          1946/07/11    [name] [name] is a pleasant 65 year old gentleman who I have seen for  hypertension, hyperlipidemia, diabetes, and atrial fibrillation.  Note,  his previous echocardiogram showed normal LV function, mild RV, and mild  RAE.  Previous Myoview shows no ischemia or infarction.  He recently was  seen by Dr. Johney Frame following a failed attempt at cardioversion.  Dr.  Johney Frame placed the patient on amiodarone and subsequently cardioverted  the patient.  However, he did not hold sinus rhythm (he was in sinus for  approximately one week and then converted to atrial fibrillation).  Dr.  Johney Frame saw him back on June 18, 2008, and catheter ablation of his  atrial fibrillation was discussed, but the patient declined.  His  amiodarone was discontinued.  We are now continuing with the rate  control and anticoagulation.  Since I last saw him, he has dyspnea with  more extreme activities, but not with routine activities.  This is  unchanged from when I saw him on May 02, 2008.  It is not  associated with chest pain or productive cough.  There is no associated  palpitations.  He does not have exertional chest pain.  His pedal edema  is at baseline.   MEDICATIONS:  1. Lisinopril 10 mg p.o. b.i.d.  2. CPAP.  3. Potassium 20 mEq p.o. daily.  4. Zocor 40 mg p.o. daily.  5. Allopurinol 100 mg p.o. daily.  6. Glimepiride.  7. Lasix 40 mg p.o. b.i.d.  8. Cardizem CD 360 mg p.o. daily.  9. Coumadin 5 mg as directed.  10.Lopressor 25 mg p.o. b.i.d.   PHYSICAL EXAMINATION:  VITAL SIGNS:  Blood pressure of 134/94 and his  pulse is 72.  HEENT:  Normal.  NECK:  Supple.  CHEST:  Clear.  CARDIOVASCULAR:  Irregular rhythm.  ABDOMEN:  No tenderness.  EXTREMITIES:  Trace  edema with chronic skin changes.   DIAGNOSES:  1. Atrial fibrillation - The patient remains in atrial fibrillation on      examination today.  He will continue on his Cardizem and Lopressor      for rate control.  He will continue with Coumadin with a goal INR      of 2-3.  I will place a 48-hour Holter monitor to make sure that      his rate is adequately controlled over 24 hours.  If he has      worsening symptoms in the future, then we could consider referral      back to Dr. Johney Frame for ablation if the patient would be agreeable.      We have discussed all of this in depth today with [name] [name].  I think      some of his dyspnea on exertion may be related to his atrial      fibrillation  as his symptoms did improve following cardioversion.  2. Hypertension - His blood pressure is mildly elevated today, but he      states it is running in the 135/80 range at home.  We will continue      with his present medications.  3. Elevated white blood cell count - I have reviewed his records and      his white blood cell count has been mildly elevated since July.  We      will repeat this today.  If it is elevated again, then he will need      to see Hematology.  4. Diabetes mellitus.  5. Hyperlipidemia - He will continue on his statin.  We will check      lipids and liver.  6. History of gout.   I will see him back in 3 months.     Madolyn Frieze Jens Som, MD, [facility]  Electronically Signed    BSC/MedQ  DD: 07/25/2008  DT: 07/25/2008  Job #: [id number]

## 2010-12-22 NOTE — Procedures (Signed)
DUPLEX DEEP VENOUS EXAM - LOWER EXTREMITY   INDICATION:  Edema for 2 years.   HISTORY:  Edema:  Yes.  Trauma/Surgery:  No.  Pain:  No.  PE:  No.  Previous DVT:  No.  Anticoagulants:  Yes, warfarin.  Other:  Atrial fibrillation.   DUPLEX EXAM:                CFV   SFV   PopV  PTV    GSV                R  L  R  L  R  L  R   L  R  L  Thrombosis    o  o  o  o  o  o  o   o  o  o  Spontaneous   +  +  +  +  +  +  +   +  +  +  Phasic        +  +  +  +  +  +  +   +  +  +  Augmentation  +  +  +  +  +  +  +   +  +  +  Compressible  +  +  +  +  +  +  +   +  +  +  Competent     +  +  +  +  +  +  +   +  +  +   Legend:  + - yes  o - no  p - partial  D - decreased   IMPRESSION:  1. Bilateral lower extremity deep veins appear negative for deep      venous thrombosis.  2. The greater saphenous vein at the saphenofemoral junction is      competent bilaterally.    _____________________________  V. Charlena Cross, MD   EM/MEDQ  D:  06/08/2010  T:  06/08/2010  Job:  [id number]

## 2010-12-22 NOTE — Assessment & Plan Note (Signed)
[facility]                         ELECTROPHYSIOLOGY OFFICE NOTE   NAME:[name], [name]                            MRN:          [id number]  DATE:06/18/2008                            DOB:          October 11, 1945    INTRODUCTION:  [name] [name] is a very pleasant 65 year old gentleman with a  history of hypertension, obesity, diabetes, and persistent atrial  fibrillation, who presents today for followup.   PROBLEM LIST:  1. Hypertension.  2. Persistent atrial fibrillation.  3. Hyperlipidemia.  4. Diabetes mellitus.  5. Gout.  6. Symptoms of sleep apnea.  It does not carry a formal diagnosis.   MEDICATIONS:  1. Amiodarone 200 mg b.i.d.  2. Metoprolol 25 mg b.i.d.  3. Coumadin to maintain an INR between 2 and 3.  4. Cardizem CD 360 mg daily.  5. Lasix 40 mg b.i.d.  6. Lisinopril 10 mg daily.  7. Glimepiride 1.5 mg daily.  8. Allopurinol 100 mg daily.  9. Simvastatin 40 mg at bedtime.  10.Potassium chloride 20 mEq daily.   INTERVAL HISTORY:  [name] [name] presents today for electrophysiology followup  of atrial fibrillation.  He was evaluated by me in October and initiated  on amiodarone.  Has subsequently cardioverted him to sinus rhythm on  May 21, 2008.  He reports improvement in symptoms of fatigue at that  time.  He also noticed improvement in bilateral lower extremity edema.  He has subsequently developed recurrent atrial fibrillation.  He  estimates that he maintains sinus rhythm for approximately 1 week before  returning to atrial fibrillation.  Nevertheless, he has adjusted to his  atrial fibrillation and feels that his quality of life is reasonably  well preserved.  He is chronically anticoagulated with Coumadin and has  tolerated this without difficulty.  He hopes to return to work in the  near future, but continues to participate in occupational rehab  regarding an injury to his hand several months ago.   PHYSICAL EXAMINATION:  VITAL SIGNS:   Blood pressure 136/89, heart rate  63, respirations 18, weight 257 pounds.  GENERAL:  The patient is an obese well-appearing male in no acute  distress.  He is alert and oriented x3.  HEENT:  Normocephalic, atraumatic.  Sclerae clear.  Conjunctivae pink.  Oropharynx clear.  NECK:  Supple.  No JVD, lymphadenopathy, or bruits.  LUNGS:  Clear to auscultation bilaterally.  HEART:  Irregularly irregular rhythm.  No murmurs, rubs, or gallops.  GI:  Soft, nontender, nondistended, positive bowel sounds.  EXTREMITIES:  No clubbing, cyanosis, 2+ bilateral lower extremity edema  with venous stasis changes evident.  SKIN:  No ecchymoses or lacerations.  MUSCULOSKELETAL:  No deformity or atrophy.  PSYCH:  Euthymic mood, full affect.   EKG reveals atrial fibrillation with an average ventricular rate of 61  beats per minute with nonspecific ST/T-wave changes.   IMPRESSION:  [name] [name] is a pleasant 65 year old gentleman with persistent  atrial fibrillation who presents today for followup.  Unfortunately, he  has developed recurrent atrial fibrillation since cardioversion 1 month  ago.  Though his symptoms of  atrial fibrillation did significantly  improve with cardioversion, he feels that his quality of life is  reasonably well preserved in atrial fibrillation.  Further therapeutic  strategies for atrial fibrillation including catheter ablation for  atrial fibrillation was again discussed in detail with the patient  today.  The patient does not wish to proceed to catheter ablation at  this time.   PLAN:  1. Amiodarone is discontinued.  2. The importance of long-term anticoagulation with Coumadin to      maintain an INR between 2 and 3 was again reinforced with the      patient today.  3. I think that is reasonable for the patient to return to work once      he is done with rehab.  We have discussed the importance of being      cautious to void major bleeding while on Coumadin.  I would be most       concerned about intracranial bleeding and head trauma.  Should the      patient have minor bleeding related to his job, I suspect that this      could be reasonably well controlled with manual pressure.  4. The patient will follow up with Dr. Olga Millers in December.  He      is aware that he may contact me to follow up at any time.     Hillis Range, MD  Electronically Signed    JA/MedQ  DD: 06/18/2008  DT: 06/19/2008  Job #: [id number]   cc:   Madolyn Frieze. Jens Som, MD, [facility]

## 2010-12-25 ENCOUNTER — Ambulatory Visit (INDEPENDENT_AMBULATORY_CARE_PROVIDER_SITE_OTHER): Payer: Medicare Other | Admitting: *Deleted

## 2010-12-25 ENCOUNTER — Encounter: Payer: Self-pay | Admitting: Internal Medicine

## 2010-12-25 DIAGNOSIS — I4891 Unspecified atrial fibrillation: Secondary | ICD-10-CM

## 2010-12-25 DIAGNOSIS — Z0000 Encounter for general adult medical examination without abnormal findings: Secondary | ICD-10-CM | POA: Insufficient documentation

## 2010-12-25 DIAGNOSIS — Z0001 Encounter for general adult medical examination with abnormal findings: Secondary | ICD-10-CM | POA: Insufficient documentation

## 2010-12-28 ENCOUNTER — Ambulatory Visit (INDEPENDENT_AMBULATORY_CARE_PROVIDER_SITE_OTHER): Payer: Medicare Other | Admitting: Internal Medicine

## 2010-12-28 ENCOUNTER — Other Ambulatory Visit: Payer: Self-pay

## 2010-12-28 ENCOUNTER — Encounter: Payer: Self-pay | Admitting: Internal Medicine

## 2010-12-28 DIAGNOSIS — W57XXXA Bitten or stung by nonvenomous insect and other nonvenomous arthropods, initial encounter: Secondary | ICD-10-CM

## 2010-12-28 DIAGNOSIS — Z0000 Encounter for general adult medical examination without abnormal findings: Secondary | ICD-10-CM

## 2010-12-28 DIAGNOSIS — IMO0001 Reserved for inherently not codable concepts without codable children: Secondary | ICD-10-CM

## 2010-12-28 DIAGNOSIS — E119 Type 2 diabetes mellitus without complications: Secondary | ICD-10-CM

## 2010-12-28 DIAGNOSIS — E785 Hyperlipidemia, unspecified: Secondary | ICD-10-CM

## 2010-12-28 DIAGNOSIS — I10 Essential (primary) hypertension: Secondary | ICD-10-CM

## 2010-12-28 MED ORDER — GLUCOSE BLOOD VI STRP: ORAL_STRIP | Status: DC

## 2010-12-28 NOTE — Progress Notes (Signed)
Subjective:    Patient ID: [name] [name], male    DOB: [birth year], 65 y.o.   MRN: [id number]  HPI Here to f/u; overall doing ok,  Pt denies chest pain, increased sob or doe, wheezing, orthopnea, PND, increased LE swelling, palpitations, dizziness or syncope.  Pt denies new neurological symptoms such as new headache, or facial or extremity weakness or numbness   Pt denies polydipsia, polyuria, or low sugar symptoms such as weakness or confusion improved with po intake.  Pt states overall good compliance with meds, trying to follow lower cholesterol, diabetic diet, wt overall stable but little exercise however.  Hard to lose wt.  Did have tick bite to back below the left shoudler blade, seen at [facility], tx with doxy course as it had some "heat" to it, now resolved.   Past Medical History  Diagnosis Date  . DIABETES MELLITUS, TYPE II 06/09/2007  . HYPERLIPIDEMIA 02/18/2007  . GOUT 06/09/2007  . Overweight 02/18/2007  . OBESITY HYPOVENTILATION SYNDROME 11/26/2008  . OBSTRUCTIVE SLEEP APNEA 07/02/2008  . COMMON MIGRAINE 06/09/2007  . Unspecified hearing loss 06/09/2007  . HYPERTENSION 02/18/2007  . Atrial fibrillation 10/16/2008  . BRONCHITIS, CHRONIC 01/02/2009  . HEMATOCHEZIA 06/26/2010  . CELLULITIS, LEG, RIGHT 03/12/2010  . SKIN RASH 03/12/2010  . PERIPHERAL EDEMA 01/05/2008  . LOOSE STOOLS 06/26/2010  . PROSTATE SPECIFIC ANTIGEN, ELEVATED 06/09/2007  . Encounter for long-term (current) use of anticoagulants 11/06/2010   Past Surgical History  Procedure Date  . Appendectomy 1974  . Inguinal herniorrhapy     x 2  . S/p right hand surgury 2009    Dr. Izora Ribas  . S/p cardioversion 05/21/2008    reports that he has quit smoking. He does not have any smokeless tobacco history on file. He reports that he drinks alcohol. He reports that he does not use illicit drugs. family history includes Alzheimer's disease in his father; Cancer in his father and other; and Coronary artery disease in his other. No Known  Allergies Current Outpatient Prescriptions on File Prior to Visit  Medication Sig Dispense Refill  . allopurinol (ZYLOPRIM) 100 MG tablet Take 100 mg by mouth daily.        Marland Kitchen diltiazem (DILACOR XR) 240 MG 24 hr capsule Take 240 mg by mouth daily.        . furosemide (LASIX) 80 MG tablet Take 1 tablet (80 mg total) by mouth 2 (two) times daily.  180 tablet  3  . glimepiride (AMARYL) 1 MG tablet Take 1 mg by mouth daily.        Marland Kitchen glucose blood (ONE TOUCH ULTRA TEST) test strip 1 each by Other route. Use one strip two times a day to test blood glucose       . lisinopril (PRINIVIL,ZESTRIL) 40 MG tablet Take 40 mg by mouth daily.        . metoprolol (LOPRESSOR) 50 MG tablet Take 50 mg by mouth 2 (two) times daily.        . simvastatin (ZOCOR) 40 MG tablet Take 40 mg by mouth daily.        Marland Kitchen warfarin (COUMADIN) 5 MG tablet Take by mouth as directed.        . potassium chloride SA (KLOR-CON M20) 20 MEQ tablet Take 20 mEq by mouth daily.         Review of Systems All otherwise neg per pt     Objective:   Physical Exam BP 118/80  Pulse 77  Temp(Src) 97.9 F (36.6  C) (Oral)  Ht 5\' 8"  (1.727 m)  Wt 296 lb (134.265 kg)  BMI 45.01 kg/m2  SpO2 94% Physical Exam  VS noted Constitutional: Pt appears well-developed and well-nourished.  HENT: Head: Normocephalic.  Right Ear: External ear normal.  Left Ear: External ear normal.  Eyes: Conjunctivae and EOM are normal. Pupils are equal, round, and reactive to light.  Neck: Normal range of motion. Neck supple.  Cardiovascular: Normal rate and regular rhythm.   Pulmonary/Chest: Effort normal and breath sounds normal.  Abd:  Soft, NT, non-distended, + BS Neurological: Pt is alert. No cranial nerve deficit.  Skin: Skin is warm. No erythema. Left back with minor erythema to tick bite site, no drainage or swelling Psychiatric: Pt behavior is normal. Thought content normal.  Has chronic 2+ edema to mid legs/venous stasis, no new erythema, ulcers,  wounds       Assessment & Plan:

## 2010-12-28 NOTE — Assessment & Plan Note (Signed)
stable overall by hx and exam, most recent lab reviewed with pt, and pt to continue medical treatment as before  Lab Results  Component Value Date   WBC 12.4* 06/22/2010   HGB 15.4 06/22/2010   HCT 46.7 06/22/2010   PLT 269.0 06/22/2010   CHOL 131 12/22/2010   TRIG 129.0 12/22/2010   HDL 37.20* 12/22/2010   ALT 15 06/22/2010   AST 19 06/22/2010   NA 144 12/22/2010   K 4.4 12/22/2010   CL 101 12/22/2010   CREATININE 1.0 12/22/2010   BUN 18 12/22/2010   CO2 35* 12/22/2010   TSH 1.62 06/22/2010   PSA 1.54 06/22/2010   INR 2.2 12/25/2010   HGBA1C 7.0* 12/22/2010   MICROALBUR 9.4* 06/22/2010    BP Readings from Last 3 Encounters:  12/28/10 118/80  09/18/10 132/80  06/26/10 148/92

## 2010-12-28 NOTE — Assessment & Plan Note (Signed)
stable overall by hx and exam, most recent lab reviewed with pt, and pt to continue medical treatment as before  Lab Results  Component Value Date   HGBA1C 7.0* 12/22/2010

## 2010-12-28 NOTE — Assessment & Plan Note (Signed)
stable overall by hx and exam, most recent lab reviewed with pt, and pt to continue medical treatment as before  Lab Results  Component Value Date   LDLCALC 68 12/22/2010

## 2010-12-28 NOTE — Patient Instructions (Addendum)
Please finish your antibiotic as prescribed Continue all other medications as before Please focus on lower calorie diet, increased activity and wt loss Please have your pharmacy call if you need refills of any of your medications Please return in 6 mo with Lab testing done 3-5 days before

## 2010-12-28 NOTE — Assessment & Plan Note (Signed)
Mild, improved with doxy course , to finish antibx (only one pill left),  to f/u any worsening symptoms or concerns \

## 2011-01-11 ENCOUNTER — Other Ambulatory Visit: Payer: Self-pay | Admitting: Cardiology

## 2011-01-25 ENCOUNTER — Ambulatory Visit (INDEPENDENT_AMBULATORY_CARE_PROVIDER_SITE_OTHER): Payer: Medicare Other | Admitting: *Deleted

## 2011-01-25 DIAGNOSIS — I4891 Unspecified atrial fibrillation: Secondary | ICD-10-CM

## 2011-01-25 LAB — POCT INR: INR: 2.1

## 2011-02-22 ENCOUNTER — Ambulatory Visit (INDEPENDENT_AMBULATORY_CARE_PROVIDER_SITE_OTHER): Payer: Medicare Other | Admitting: *Deleted

## 2011-02-22 DIAGNOSIS — I4891 Unspecified atrial fibrillation: Secondary | ICD-10-CM

## 2011-03-22 ENCOUNTER — Ambulatory Visit (INDEPENDENT_AMBULATORY_CARE_PROVIDER_SITE_OTHER): Payer: Medicare Other | Admitting: *Deleted

## 2011-03-22 DIAGNOSIS — I4891 Unspecified atrial fibrillation: Secondary | ICD-10-CM

## 2011-03-22 LAB — POCT INR: INR: 2.4

## 2011-04-15 ENCOUNTER — Other Ambulatory Visit: Payer: Self-pay | Admitting: Cardiology

## 2011-04-19 ENCOUNTER — Ambulatory Visit (INDEPENDENT_AMBULATORY_CARE_PROVIDER_SITE_OTHER): Payer: Medicare Other | Admitting: *Deleted

## 2011-04-19 DIAGNOSIS — I4891 Unspecified atrial fibrillation: Secondary | ICD-10-CM

## 2011-04-19 LAB — POCT INR: INR: 2.4

## 2011-05-06 LAB — BASIC METABOLIC PANEL
BUN: 10
BUN: 7
CO2: 33 — ABNORMAL HIGH
Calcium: 8.6
Chloride: 101
Chloride: 104
Creatinine, Ser: 0.82
Creatinine, Ser: 0.89
Creatinine, Ser: 1.01
GFR calc Af Amer: >60
GFR calc Af Amer: >60
GFR calc non Af Amer: >60
GFR calc non Af Amer: >60
Glucose, Bld: 93
Potassium: 3.4 — ABNORMAL LOW

## 2011-05-06 LAB — CARDIAC PANEL(CRET KIN+CKTOT+MB+TROPI)
CK, MB: 1.9
CK, MB: 2
Relative Index: 0.9
Relative Index: 1.3
Total CK: 189
Total CK: 216
Troponin I: 0.01

## 2011-05-06 LAB — PROTIME-INR
INR: 1.4
Prothrombin Time: 13.4

## 2011-05-06 LAB — CBC
MCHC: 32.6
MCHC: 32.9
MCV: 83.7
MCV: 83.9
MCV: 84.7
Platelets: 270
Platelets: 357
RBC: 4.71
RBC: 5.1
RDW: 15.9 — ABNORMAL HIGH
RDW: 16 — ABNORMAL HIGH
WBC: 12 — ABNORMAL HIGH
WBC: 15.8 — ABNORMAL HIGH

## 2011-05-06 LAB — CULTURE, BLOOD (ROUTINE X 2)
Culture: NO GROWTH
Culture: NO GROWTH

## 2011-05-06 LAB — MAGNESIUM: Magnesium: 2.2

## 2011-05-06 LAB — B-NATRIURETIC PEPTIDE (CONVERTED LAB): Pro B Natriuretic peptide (BNP): 123 — ABNORMAL HIGH

## 2011-05-06 LAB — BLOOD GAS, ARTERIAL
Bicarbonate: 31.3 — ABNORMAL HIGH
O2 Saturation: 95.3
TCO2: 33
pH, Arterial: 7.372
pO2, Arterial: 76.4 — ABNORMAL LOW

## 2011-05-10 LAB — GLUCOSE, CAPILLARY: Glucose-Capillary: 101 — ABNORMAL HIGH

## 2011-05-10 LAB — BASIC METABOLIC PANEL
BUN: 14
Chloride: 101
Potassium: 4.1

## 2011-05-10 LAB — CBC
HCT: 50.4
MCV: 80.8
RBC: 6.24 — ABNORMAL HIGH
WBC: 13.2 — ABNORMAL HIGH

## 2011-05-12 LAB — PROTIME-INR: INR: 3.2 — ABNORMAL HIGH

## 2011-05-12 LAB — BASIC METABOLIC PANEL
CO2: 30
Chloride: 104
Glucose, Bld: 87
Potassium: 3.8
Sodium: 142

## 2011-05-12 LAB — GLUCOSE, CAPILLARY: Glucose-Capillary: 83

## 2011-05-12 LAB — APTT: aPTT: 58 — ABNORMAL HIGH

## 2011-05-12 LAB — DIFFERENTIAL
Basophils Absolute: 0
Basophils Relative: 0
Eosinophils Relative: 2
Monocytes Absolute: 0.7

## 2011-05-12 LAB — CBC
HCT: 44.1
Hemoglobin: 14.3
MCHC: 32.5
RDW: 16.6 — ABNORMAL HIGH

## 2011-05-17 ENCOUNTER — Ambulatory Visit (INDEPENDENT_AMBULATORY_CARE_PROVIDER_SITE_OTHER): Payer: Medicare Other | Admitting: *Deleted

## 2011-05-17 DIAGNOSIS — I4891 Unspecified atrial fibrillation: Secondary | ICD-10-CM

## 2011-05-17 LAB — POCT INR: INR: 2.6

## 2011-05-19 ENCOUNTER — Telehealth: Payer: Self-pay

## 2011-05-19 NOTE — Telephone Encounter (Signed)
Fluvirin Left Deltoid IM Manufacturer [employer] Date Administered 05/17/2011 Lot Number [id number] Expiration date 01/08/2012 Pharmacy [facility] [address] [place]

## 2011-06-14 ENCOUNTER — Ambulatory Visit (INDEPENDENT_AMBULATORY_CARE_PROVIDER_SITE_OTHER): Payer: Medicare Other | Admitting: *Deleted

## 2011-06-14 DIAGNOSIS — I4891 Unspecified atrial fibrillation: Secondary | ICD-10-CM

## 2011-06-14 DIAGNOSIS — Z7901 Long term (current) use of anticoagulants: Secondary | ICD-10-CM

## 2011-06-14 LAB — POCT INR: INR: 2.2

## 2011-06-22 ENCOUNTER — Other Ambulatory Visit (INDEPENDENT_AMBULATORY_CARE_PROVIDER_SITE_OTHER): Payer: Medicare Other

## 2011-06-22 DIAGNOSIS — E119 Type 2 diabetes mellitus without complications: Secondary | ICD-10-CM

## 2011-06-22 DIAGNOSIS — Z0000 Encounter for general adult medical examination without abnormal findings: Secondary | ICD-10-CM

## 2011-06-22 LAB — MICROALBUMIN / CREATININE URINE RATIO: Creatinine,U: 83 mg/dL

## 2011-06-22 LAB — HEPATIC FUNCTION PANEL
ALT: 19 U/L (ref 0–53)
AST: 19 U/L (ref 0–37)
Alkaline Phosphatase: 73 U/L (ref 39–117)
Bilirubin, Direct: 0.2 mg/dL (ref 0.0–0.3)
Total Bilirubin: 1.1 mg/dL (ref 0.3–1.2)

## 2011-06-22 LAB — URINALYSIS, ROUTINE W REFLEX MICROSCOPIC
Hgb urine dipstick: NEGATIVE
Ketones, ur: NEGATIVE
Leukocytes, UA: NEGATIVE
Specific Gravity, Urine: 1.015 (ref 1.000–1.030)
Urobilinogen, UA: 0.2 (ref 0.0–1.0)

## 2011-06-22 LAB — BASIC METABOLIC PANEL
BUN: 18 mg/dL (ref 6–23)
Calcium: 9.2 mg/dL (ref 8.4–10.5)
GFR: 87.61 mL/min (ref >60.00)
Glucose, Bld: 99 mg/dL (ref 70–99)

## 2011-06-22 LAB — CBC WITH DIFFERENTIAL/PLATELET
Basophils Absolute: 0 10*3/uL (ref 0.0–0.1)
Eosinophils Absolute: 0.2 10*3/uL (ref 0.0–0.7)
Lymphocytes Relative: 23.9 % (ref 12.0–46.0)
Lymphs Abs: 2.8 10*3/uL (ref 0.7–4.0)
MCHC: 32.7 g/dL (ref 30.0–36.0)
Monocytes Relative: 6.9 % (ref 3.0–12.0)
Platelets: 251 10*3/uL (ref 150.0–400.0)
RDW: 15.9 % — ABNORMAL HIGH (ref 11.5–14.6)

## 2011-06-22 LAB — TSH: TSH: 1.85 u[IU]/mL (ref 0.35–5.50)

## 2011-06-22 LAB — HEMOGLOBIN A1C: Hgb A1c MFr Bld: 7 % — ABNORMAL HIGH (ref 4.6–6.5)

## 2011-06-28 ENCOUNTER — Encounter: Payer: Self-pay | Admitting: Internal Medicine

## 2011-06-28 ENCOUNTER — Ambulatory Visit (INDEPENDENT_AMBULATORY_CARE_PROVIDER_SITE_OTHER): Payer: Medicare Other | Admitting: Internal Medicine

## 2011-06-28 VITALS — BP 142/94 | HR 87 | Temp 98.5°F | Ht 68.0 in | Wt 293.5 lb

## 2011-06-28 DIAGNOSIS — I10 Essential (primary) hypertension: Secondary | ICD-10-CM

## 2011-06-28 DIAGNOSIS — E785 Hyperlipidemia, unspecified: Secondary | ICD-10-CM

## 2011-06-28 DIAGNOSIS — E119 Type 2 diabetes mellitus without complications: Secondary | ICD-10-CM

## 2011-06-28 MED ORDER — SIMVASTATIN 40 MG PO TABS: 40.0000 mg | ORAL_TABLET | Freq: Every day | ORAL | Status: DC

## 2011-06-28 MED ORDER — ALLOPURINOL 100 MG PO TABS: 100.0000 mg | ORAL_TABLET | Freq: Every day | ORAL | Status: DC

## 2011-06-28 MED ORDER — POTASSIUM CHLORIDE CRYS ER 20 MEQ PO TBCR: 20.0000 meq | EXTENDED_RELEASE_TABLET | Freq: Every day | ORAL | Status: DC

## 2011-06-28 MED ORDER — WARFARIN SODIUM 5 MG PO TABS: 5.0000 mg | ORAL_TABLET | Freq: Every day | ORAL | Status: DC

## 2011-06-28 MED ORDER — DILTIAZEM HCL ER 240 MG PO CP24: 240.0000 mg | ORAL_CAPSULE | Freq: Every day | ORAL | Status: DC

## 2011-06-28 MED ORDER — FUROSEMIDE 80 MG PO TABS: 80.0000 mg | ORAL_TABLET | Freq: Two times a day (BID) | ORAL | Status: DC

## 2011-06-28 MED ORDER — METOPROLOL TARTRATE 50 MG PO TABS: 50.0000 mg | ORAL_TABLET | Freq: Two times a day (BID) | ORAL | Status: DC

## 2011-06-28 MED ORDER — LISINOPRIL 40 MG PO TABS: 40.0000 mg | ORAL_TABLET | Freq: Every day | ORAL | Status: DC

## 2011-06-28 MED ORDER — GLIMEPIRIDE 1 MG PO TABS: 1.0000 mg | ORAL_TABLET | Freq: Every day | ORAL | Status: DC

## 2011-06-28 NOTE — Patient Instructions (Signed)
Continue all other medications as before You are given all the new refills today You are otherwise up to date with prevention today Please return in 6 months, or sooner if needed, and we will need blood work done that day

## 2011-07-04 ENCOUNTER — Encounter: Payer: Self-pay | Admitting: Internal Medicine

## 2011-07-04 NOTE — Assessment & Plan Note (Signed)
stable overall by hx and exam, most recent data reviewed with pt, and pt to continue medical treatment as before  Lab Results  Component Value Date   HGBA1C 7.0* 06/22/2011

## 2011-07-04 NOTE — Assessment & Plan Note (Signed)
stable overall by hx and exam, most recent data reviewed with pt, and pt to continue medical treatment as before  Lab Results  Component Value Date   LDLCALC 60 06/22/2011

## 2011-07-04 NOTE — Progress Notes (Signed)
  Subjective:    Patient ID: [name] [name], male    DOB: 09-20-1945, 65 y.o.   MRN: [id number]  HPI  Here to f/u; overall doing ok,  Pt denies chest pain, increased sob or doe, wheezing, orthopnea, PND, increased LE swelling, palpitations, dizziness or syncope.  Pt denies new neurological symptoms such as new headache, or facial or extremity weakness or numbness   Pt denies polydipsia, polyuria, or low sugar symptoms such as weakness or confusion improved with po intake.  Pt states overall good compliance with meds, trying to follow lower cholesterol, diabetic diet, wt overall stable but little exercise however.  Does have sense of ongoing fatigue, but denies signficant hypersomnolence.  Had recent fall off ladder on chronic coumadin, but no apparent injury. Past Medical History  Diagnosis Date  . DIABETES MELLITUS, TYPE II 06/09/2007  . HYPERLIPIDEMIA 02/18/2007  . GOUT 06/09/2007  . Overweight 02/18/2007  . OBESITY HYPOVENTILATION SYNDROME 11/26/2008  . OBSTRUCTIVE SLEEP APNEA 07/02/2008  . COMMON MIGRAINE 06/09/2007  . Unspecified hearing loss 06/09/2007  . HYPERTENSION 02/18/2007  . Atrial fibrillation 10/16/2008  . BRONCHITIS, CHRONIC 01/02/2009  . HEMATOCHEZIA 06/26/2010  . CELLULITIS, LEG, RIGHT 03/12/2010  . SKIN RASH 03/12/2010  . PERIPHERAL EDEMA 01/05/2008  . LOOSE STOOLS 06/26/2010  . PROSTATE SPECIFIC ANTIGEN, ELEVATED 06/09/2007  . Encounter for long-term (current) use of anticoagulants 11/06/2010   Past Surgical History  Procedure Date  . Appendectomy 1974  . Inguinal herniorrhapy     x 2  . S/p right hand surgury 2009    Dr. Izora Ribas  . S/p cardioversion 05/21/2008    reports that he has quit smoking. He does not have any smokeless tobacco history on file. He reports that he drinks alcohol. He reports that he does not use illicit drugs. family history includes Alzheimer's disease in his father; Cancer in his father and other; and Coronary artery disease in his other. No Known  Allergies Current Outpatient Prescriptions on File Prior to Visit  Medication Sig Dispense Refill  . glucose blood (ONE TOUCH ULTRA TEST) test strip Use one strip two times a day to test blood glucose  200 each  3  . mupirocin (BACTROBAN) 2 % ointment Apply topically 2 (two) times daily.         Review of Systems Review of Systems  Constitutional: Negative for diaphoresis and unexpected weight change.  HENT: Negative for drooling and tinnitus.   Eyes: Negative for photophobia and visual disturbance.  Respiratory: Negative for choking and stridor.   Gastrointestinal: Negative for vomiting and blood in stool.       Objective:   Physical Exam BP 142/94  Pulse 87  Temp(Src) 98.5 F (36.9 C) (Oral)  Ht 5\' 8"  (1.727 m)  Wt 293 lb 8 oz (133.131 kg)  BMI 44.63 kg/m2  SpO2 93% Physical Exam  VS noted Constitutional: Pt appears well-developed and well-nourished.  HENT: Head: Normocephalic.  Right Ear: External ear normal.  Left Ear: External ear normal.  Eyes: Conjunctivae and EOM are normal. Pupils are equal, round, and reactive to light.  Neck: Normal range of motion. Neck supple.  Cardiovascular: Normal rate and regular rhythm.   Pulmonary/Chest: Effort normal and breath sounds normal.  Abd:  Soft, NT, non-distended, + BS Neurological: Pt is alert. No cranial nerve deficit.  Skin: Skin is warm. No erythema.  Psychiatric: Pt behavior is normal. Thought content normal.     Assessment & Plan:

## 2011-07-04 NOTE — Assessment & Plan Note (Signed)
stable overall by hx and exam, most recent data reviewed with pt, and pt to continue medical treatment as before  BP Readings from Last 3 Encounters:  06/28/11 142/94  12/28/10 118/80  09/18/10 132/80

## 2011-07-26 ENCOUNTER — Ambulatory Visit (INDEPENDENT_AMBULATORY_CARE_PROVIDER_SITE_OTHER): Payer: Medicare Other | Admitting: *Deleted

## 2011-07-26 DIAGNOSIS — Z7901 Long term (current) use of anticoagulants: Secondary | ICD-10-CM

## 2011-07-26 DIAGNOSIS — I4891 Unspecified atrial fibrillation: Secondary | ICD-10-CM

## 2011-07-26 LAB — POCT INR: INR: 2.3

## 2011-08-01 ENCOUNTER — Other Ambulatory Visit: Payer: Self-pay | Admitting: Cardiology

## 2011-08-01 ENCOUNTER — Other Ambulatory Visit: Payer: Self-pay | Admitting: Internal Medicine

## 2011-09-06 ENCOUNTER — Ambulatory Visit (INDEPENDENT_AMBULATORY_CARE_PROVIDER_SITE_OTHER): Payer: Medicare Other | Admitting: Pharmacist

## 2011-09-06 DIAGNOSIS — I4891 Unspecified atrial fibrillation: Secondary | ICD-10-CM

## 2011-09-06 DIAGNOSIS — Z7901 Long term (current) use of anticoagulants: Secondary | ICD-10-CM | POA: Diagnosis not present

## 2011-09-06 LAB — POCT INR: INR: 1.6

## 2011-09-28 ENCOUNTER — Other Ambulatory Visit: Payer: Self-pay | Admitting: Cardiology

## 2011-09-28 ENCOUNTER — Other Ambulatory Visit: Payer: Self-pay | Admitting: Internal Medicine

## 2011-10-18 ENCOUNTER — Ambulatory Visit (INDEPENDENT_AMBULATORY_CARE_PROVIDER_SITE_OTHER): Payer: Medicare Other | Admitting: *Deleted

## 2011-10-18 DIAGNOSIS — I4891 Unspecified atrial fibrillation: Secondary | ICD-10-CM | POA: Diagnosis not present

## 2011-10-18 DIAGNOSIS — Z7901 Long term (current) use of anticoagulants: Secondary | ICD-10-CM

## 2011-11-29 ENCOUNTER — Ambulatory Visit (INDEPENDENT_AMBULATORY_CARE_PROVIDER_SITE_OTHER): Payer: Medicare Other

## 2011-11-29 DIAGNOSIS — I4891 Unspecified atrial fibrillation: Secondary | ICD-10-CM

## 2011-11-29 DIAGNOSIS — Z7901 Long term (current) use of anticoagulants: Secondary | ICD-10-CM | POA: Diagnosis not present

## 2011-11-29 LAB — POCT INR: INR: 2.7

## 2011-12-27 ENCOUNTER — Ambulatory Visit: Payer: Medicare Other | Admitting: Internal Medicine

## 2011-12-28 ENCOUNTER — Other Ambulatory Visit: Payer: Self-pay | Admitting: Cardiology

## 2012-01-06 ENCOUNTER — Other Ambulatory Visit (INDEPENDENT_AMBULATORY_CARE_PROVIDER_SITE_OTHER): Payer: Medicare Other

## 2012-01-06 ENCOUNTER — Ambulatory Visit (INDEPENDENT_AMBULATORY_CARE_PROVIDER_SITE_OTHER): Payer: Medicare Other | Admitting: Internal Medicine

## 2012-01-06 ENCOUNTER — Other Ambulatory Visit: Payer: Self-pay | Admitting: Internal Medicine

## 2012-01-06 ENCOUNTER — Encounter: Payer: Self-pay | Admitting: Internal Medicine

## 2012-01-06 VITALS — BP 134/80 | HR 111 | Temp 98.5°F | Ht 68.0 in | Wt 303.0 lb

## 2012-01-06 DIAGNOSIS — R972 Elevated prostate specific antigen [PSA]: Secondary | ICD-10-CM

## 2012-01-06 DIAGNOSIS — I10 Essential (primary) hypertension: Secondary | ICD-10-CM

## 2012-01-06 DIAGNOSIS — E785 Hyperlipidemia, unspecified: Secondary | ICD-10-CM

## 2012-01-06 DIAGNOSIS — E119 Type 2 diabetes mellitus without complications: Secondary | ICD-10-CM

## 2012-01-06 LAB — HEMOGLOBIN A1C: Hgb A1c MFr Bld: 7.3 % — ABNORMAL HIGH (ref 4.6–6.5)

## 2012-01-06 LAB — BASIC METABOLIC PANEL
BUN: 14 mg/dL (ref 6–23)
CO2: 35 mEq/L — ABNORMAL HIGH (ref 19–32)
Chloride: 103 mEq/L (ref 96–112)
Creatinine, Ser: 0.9 mg/dL (ref 0.4–1.5)

## 2012-01-06 LAB — LIPID PANEL
Cholesterol: 131 mg/dL (ref 0–200)
Triglycerides: 192 mg/dL — ABNORMAL HIGH (ref 0.0–149.0)

## 2012-01-06 MED ORDER — GLIMEPIRIDE 1 MG PO TABS: ORAL_TABLET | ORAL | Status: DC

## 2012-01-06 NOTE — Assessment & Plan Note (Signed)
psa variable but increased again last visit; to re-check today Lab Results  Component Value Date   PSA 2.69 06/22/2011   PSA 1.54 06/22/2010   PSA 1.73 06/20/2009

## 2012-01-06 NOTE — Progress Notes (Signed)
Subjective:    Patient ID: [name] [name], male    DOB: [birth year], 66 y.o.   MRN: [id number]  HPI  Here to f/u; overall doing ok,  Pt denies chest pain, increased sob or doe, wheezing, orthopnea, PND, increased LE swelling, palpitations, dizziness or syncope.  Pt denies new neurological symptoms such as new headache, or facial or extremity weakness or numbness   Pt denies polydipsia, polyuria, or low sugar symptoms such as weakness or confusion improved with po intake.  Pt states overall good compliance with meds, trying to follow lower cholesterol, diabetic diet, but little exercise however, and wt now is at lifetime peak.  No current complaints.  Unofrtunately is now at peak lifetime wt but intends to try to lose.  Does have sense of ongoing fatigue, but denies hypersomnolence Past Medical History  Diagnosis Date  . DIABETES MELLITUS, TYPE II 06/09/2007  . HYPERLIPIDEMIA 02/18/2007  . GOUT 06/09/2007  . Overweight 02/18/2007  . OBESITY HYPOVENTILATION SYNDROME 11/26/2008  . OBSTRUCTIVE SLEEP APNEA 07/02/2008  . COMMON MIGRAINE 06/09/2007  . Unspecified hearing loss 06/09/2007  . HYPERTENSION 02/18/2007  . Atrial fibrillation 10/16/2008  . BRONCHITIS, CHRONIC 01/02/2009  . HEMATOCHEZIA 06/26/2010  . CELLULITIS, LEG, RIGHT 03/12/2010  . SKIN RASH 03/12/2010  . PERIPHERAL EDEMA 01/05/2008  . LOOSE STOOLS 06/26/2010  . PROSTATE SPECIFIC ANTIGEN, ELEVATED 06/09/2007  . Encounter for long-term (current) use of anticoagulants 11/06/2010   Past Surgical History  Procedure Date  . Appendectomy 1974  . Inguinal herniorrhapy     x 2  . S/p right hand surgury 2009    Dr. Izora Ribas  . S/p cardioversion 05/21/2008    reports that he has quit smoking. He does not have any smokeless tobacco history on file. He reports that he drinks alcohol. He reports that he does not use illicit drugs. family history includes Alzheimer's disease in his father; Cancer in his father and other; and Coronary artery disease in his  other. No Known Allergies Current Outpatient Prescriptions on File Prior to Visit  Medication Sig Dispense Refill  . allopurinol (ZYLOPRIM) 100 MG tablet TAKE 1 TABLET BY MOUTH ONCE A DAY  90 tablet  2  . diltiazem (CARDIZEM CD) 300 MG 24 hr capsule TAKE 1 CAPSULE BY MOUTH ONCE DAILY  90 capsule  2  . diltiazem (DILACOR XR) 240 MG 24 hr capsule Take 1 capsule (240 mg total) by mouth daily.  90 capsule  3  . furosemide (LASIX) 80 MG tablet Take 1 tablet (80 mg total) by mouth 2 (two) times daily.  180 tablet  3  . glucose blood (ONE TOUCH ULTRA TEST) test strip Use one strip two times a day to test blood glucose  200 each  3  . KLOR-CON M20 20 MEQ tablet TAKE 1 TABLET BY MOUTH ONCE A DAY  90 tablet  2  . lisinopril (PRINIVIL,ZESTRIL) 40 MG tablet TAKE 1 TABLET BY MOUTH ONCE DAILY  90 tablet  2  . metoprolol (LOPRESSOR) 50 MG tablet TAKE 1 TABLET BY MOUTH 2 TIMES A DAY  180 tablet  1  . simvastatin (ZOCOR) 40 MG tablet TAKE 1 TABLET BY MOUTH ONCE A DAY  90 tablet  2  . warfarin (COUMADIN) 5 MG tablet TAKE 1 TABLET BY MOUTH AS DIRECTED  90 tablet  0  . warfarin (COUMADIN) 5 MG tablet Take as directed by coumadin clinic  90 tablet  0  . glimepiride (AMARYL) 1 MG tablet 1 tab by mouth in the  AM, and 1/2 tab in the PM  135 tablet  3   Review of Systems Review of Systems  Constitutional: Negative for diaphoresis and unexpected weight change.  Eyes: Negative for photophobia and visual disturbance.  Respiratory: Negative for choking and stridor.   Gastrointestinal: Negative for vomiting and blood in stool.  Genitourinary: Negative for hematuria and decreased urine volume.  Musculoskeletal: Negative for gait problem.  Skin: Negative for color change and wound.  Neurological: Negative for tremors and numbness.     Objective:   Physical Exam BP 134/80  Pulse 111  Temp(Src) 98.5 F (36.9 C) (Oral)  Ht 5\' 8"  (1.727 m)  Wt 303 lb (137.44 kg)  BMI 46.07 kg/m2  SpO2 95% Physical Exam  VS  noted Constitutional: Pt appears well-developed and well-nourished.  HENT: Head: Normocephalic.  Right Ear: External ear normal.  Left Ear: External ear normal.  Eyes: Conjunctivae and EOM are normal. Pupils are equal, round, and reactive to light.  Neck: Normal range of motion. Neck supple.  Cardiovascular: Normal rate and regular rhythm.   Pulmonary/Chest: Effort normal and breath sounds normal.  Abd:  Soft, NT, non-distended, + BS Neurological: Pt is alert. No cranial nerve deficit.  Skin: Skin is warm. No erythema.   Chronic 1+ edema bilat to knees improved overall today Psychiatric: Pt behavior is normal. Thought content normal.     Assessment & Plan:

## 2012-01-06 NOTE — Patient Instructions (Signed)
Continue all other medications as before, and only take the diltiazem at the dose you are taking now (either the 240 or 300, not both, and let us know next time which you actually take) Please go to LAB in the Basement for the blood and/or urine tests to be done today You will be contacted by phone if any changes need to be made immediately.  Otherwise, you will receive a letter about your results with an explanation. Please return in 6 months, or sooner if needed

## 2012-01-09 ENCOUNTER — Encounter: Payer: Self-pay | Admitting: Internal Medicine

## 2012-01-09 NOTE — Assessment & Plan Note (Signed)
stable overall by hx and exam, most recent data reviewed with pt, and pt to continue medical treatment as before Lab Results  Component Value Date   HGBA1C 7.3* 01/06/2012    

## 2012-01-09 NOTE — Assessment & Plan Note (Signed)
stable overall by hx and exam, most recent data reviewed with pt, and pt to continue medical treatment as before Lab Results  Component Value Date   LDLCALC 55 01/06/2012    

## 2012-01-09 NOTE — Assessment & Plan Note (Signed)
BP Readings from Last 3 Encounters:  01/06/12 134/80  06/28/11 142/94  12/28/10 118/80   stable overall by hx and exam, most recent data reviewed with pt, and pt to continue medical treatment as before, has some confusion about which diltiazem he is taking, and will let us know

## 2012-01-10 ENCOUNTER — Ambulatory Visit (INDEPENDENT_AMBULATORY_CARE_PROVIDER_SITE_OTHER): Payer: Medicare Other

## 2012-01-10 DIAGNOSIS — I4891 Unspecified atrial fibrillation: Secondary | ICD-10-CM

## 2012-01-10 DIAGNOSIS — Z7901 Long term (current) use of anticoagulants: Secondary | ICD-10-CM | POA: Diagnosis not present

## 2012-02-21 ENCOUNTER — Ambulatory Visit (INDEPENDENT_AMBULATORY_CARE_PROVIDER_SITE_OTHER): Payer: Medicare Other | Admitting: *Deleted

## 2012-02-21 DIAGNOSIS — Z7901 Long term (current) use of anticoagulants: Secondary | ICD-10-CM

## 2012-02-21 DIAGNOSIS — I4891 Unspecified atrial fibrillation: Secondary | ICD-10-CM | POA: Diagnosis not present

## 2012-04-03 ENCOUNTER — Ambulatory Visit (INDEPENDENT_AMBULATORY_CARE_PROVIDER_SITE_OTHER): Payer: Medicare Other | Admitting: *Deleted

## 2012-04-03 DIAGNOSIS — I4891 Unspecified atrial fibrillation: Secondary | ICD-10-CM

## 2012-04-03 DIAGNOSIS — Z7901 Long term (current) use of anticoagulants: Secondary | ICD-10-CM

## 2012-04-12 ENCOUNTER — Encounter: Payer: Self-pay | Admitting: Pharmacist

## 2012-05-15 ENCOUNTER — Ambulatory Visit (INDEPENDENT_AMBULATORY_CARE_PROVIDER_SITE_OTHER): Payer: Medicare Other | Admitting: *Deleted

## 2012-05-15 DIAGNOSIS — Z7901 Long term (current) use of anticoagulants: Secondary | ICD-10-CM

## 2012-05-15 DIAGNOSIS — I4891 Unspecified atrial fibrillation: Secondary | ICD-10-CM

## 2012-06-09 ENCOUNTER — Ambulatory Visit: Payer: Medicare Other

## 2012-06-09 ENCOUNTER — Ambulatory Visit (INDEPENDENT_AMBULATORY_CARE_PROVIDER_SITE_OTHER): Payer: Medicare Other

## 2012-06-09 DIAGNOSIS — Z23 Encounter for immunization: Secondary | ICD-10-CM

## 2012-06-12 ENCOUNTER — Ambulatory Visit (INDEPENDENT_AMBULATORY_CARE_PROVIDER_SITE_OTHER): Payer: Medicare Other | Admitting: *Deleted

## 2012-06-12 DIAGNOSIS — I4891 Unspecified atrial fibrillation: Secondary | ICD-10-CM | POA: Diagnosis not present

## 2012-06-12 DIAGNOSIS — Z7901 Long term (current) use of anticoagulants: Secondary | ICD-10-CM

## 2012-07-02 ENCOUNTER — Other Ambulatory Visit: Payer: Self-pay | Admitting: Cardiology

## 2012-07-11 ENCOUNTER — Other Ambulatory Visit (INDEPENDENT_AMBULATORY_CARE_PROVIDER_SITE_OTHER): Payer: Medicare Other

## 2012-07-11 ENCOUNTER — Encounter: Payer: Self-pay | Admitting: Internal Medicine

## 2012-07-11 ENCOUNTER — Ambulatory Visit (INDEPENDENT_AMBULATORY_CARE_PROVIDER_SITE_OTHER): Payer: Medicare Other | Admitting: General Practice

## 2012-07-11 ENCOUNTER — Ambulatory Visit (INDEPENDENT_AMBULATORY_CARE_PROVIDER_SITE_OTHER): Payer: Medicare Other | Admitting: Internal Medicine

## 2012-07-11 VITALS — BP 140/90 | HR 82 | Temp 97.7°F | Ht 68.0 in | Wt 293.1 lb

## 2012-07-11 DIAGNOSIS — Z7901 Long term (current) use of anticoagulants: Secondary | ICD-10-CM | POA: Diagnosis not present

## 2012-07-11 DIAGNOSIS — I4891 Unspecified atrial fibrillation: Secondary | ICD-10-CM

## 2012-07-11 DIAGNOSIS — E119 Type 2 diabetes mellitus without complications: Secondary | ICD-10-CM

## 2012-07-11 DIAGNOSIS — I10 Essential (primary) hypertension: Secondary | ICD-10-CM

## 2012-07-11 DIAGNOSIS — E785 Hyperlipidemia, unspecified: Secondary | ICD-10-CM

## 2012-07-11 DIAGNOSIS — M25562 Pain in left knee: Secondary | ICD-10-CM

## 2012-07-11 DIAGNOSIS — R972 Elevated prostate specific antigen [PSA]: Secondary | ICD-10-CM

## 2012-07-11 DIAGNOSIS — M25569 Pain in unspecified knee: Secondary | ICD-10-CM

## 2012-07-11 LAB — POCT INR: INR: 2.3

## 2012-07-11 LAB — LIPID PANEL
HDL: 35.5 mg/dL — ABNORMAL LOW (ref >39.00)
LDL Cholesterol: 60 mg/dL (ref 0–99)
Total CHOL/HDL Ratio: 3
Triglycerides: 100 mg/dL (ref 0.0–149.0)
VLDL: 20 mg/dL (ref 0.0–40.0)

## 2012-07-11 LAB — CBC WITH DIFFERENTIAL/PLATELET
Basophils Relative: 0.3 % (ref 0.0–3.0)
Eosinophils Absolute: 0.1 10*3/uL (ref 0.0–0.7)
Hemoglobin: 16.2 g/dL (ref 13.0–17.0)
Lymphocytes Relative: 21.6 % (ref 12.0–46.0)
MCHC: 32.1 g/dL (ref 30.0–36.0)
Monocytes Relative: 6.9 % (ref 3.0–12.0)
Neutro Abs: 7.1 10*3/uL (ref 1.4–7.7)
RBC: 5.98 Mil/uL — ABNORMAL HIGH (ref 4.22–5.81)

## 2012-07-11 LAB — BASIC METABOLIC PANEL
CO2: 35 mEq/L — ABNORMAL HIGH (ref 19–32)
Calcium: 8.7 mg/dL (ref 8.4–10.5)
Creatinine, Ser: 0.7 mg/dL (ref 0.4–1.5)
GFR: 112.27 mL/min (ref >60.00)

## 2012-07-11 LAB — HEPATIC FUNCTION PANEL
Bilirubin, Direct: 0.2 mg/dL (ref 0.0–0.3)
Total Bilirubin: 1.2 mg/dL (ref 0.3–1.2)
Total Protein: 7.1 g/dL (ref 6.0–8.3)

## 2012-07-11 NOTE — Assessment & Plan Note (Signed)
For f/u psa, o/w asympt

## 2012-07-11 NOTE — Assessment & Plan Note (Signed)
stable overall by hx and exam, most recent data reviewed with pt, and pt to continue medical treatment as before Lab Results  Component Value Date   HGBA1C 7.3* 01/06/2012

## 2012-07-11 NOTE — Patient Instructions (Addendum)
Please see [name] [name] for your Coumadin check after your visit today Continue all other medications as before Please have the pharmacy call with any other refills you may need. Please go to LAB in the Basement for the blood and/or urine tests to be done today You will be contacted by phone if any changes need to be made immediately.  Otherwise, you will receive a letter about your results with an explanation, but please check with MyChart first. Thank you for enrolling in MyChart. Please follow the instructions below to securely access your online medical record. MyChart allows you to send messages to your doctor, view your test results, renew your prescriptions, schedule appointments, and more. To Log into MyChart, please go to [email or web address], and your Username is:  [email or web address] You will be contacted regarding the referral for: your yearly f/u with Dr Jens Som, as well as orthopedic for the left knee Please return in 6 months, or sooner if needed

## 2012-07-11 NOTE — Assessment & Plan Note (Signed)
stable overall by hx and exam, most recent data reviewed with pt, and pt to continue medical treatment as before BP Readings from Last 3 Encounters:  07/11/12 140/90  01/06/12 134/80  06/28/11 142/94

## 2012-07-11 NOTE — Assessment & Plan Note (Signed)
stable overall by hx and exam, most recent data reviewed with pt, and pt to continue medical treatment as before, to also refer back to cards as he is due

## 2012-07-11 NOTE — Progress Notes (Signed)
Subjective:    Patient ID: [name] [name], male    DOB: November 09, 1945, 66 y.o.   MRN: [id number]  HPI  Here to f/u; overall doing ok,  Pt denies chest pain, increased sob or doe, wheezing, orthopnea, PND, increased LE swelling, palpitations, dizziness or syncope.  Pt denies new neurological symptoms such as new headache, or facial or extremity weakness or numbness   Pt denies polydipsia, polyuria, or low sugar symptoms such as weakness or confusion improved with po intake.  Pt states overall good compliance with meds, trying to follow lower cholesterol, diabetic diet, wt overall stable but little exercise however.  Due for f/u with cards.  Also with recent left knee pain, swelling for several wks, just not improved after a long hike with young people. Past Medical History  Diagnosis Date  . DIABETES MELLITUS, TYPE II 06/09/2007  . HYPERLIPIDEMIA 02/18/2007  . GOUT 06/09/2007  . Overweight 02/18/2007  . OBESITY HYPOVENTILATION SYNDROME 11/26/2008  . OBSTRUCTIVE SLEEP APNEA 07/02/2008  . COMMON MIGRAINE 06/09/2007  . Unspecified hearing loss 06/09/2007  . HYPERTENSION 02/18/2007  . Atrial fibrillation 10/16/2008  . BRONCHITIS, CHRONIC 01/02/2009  . HEMATOCHEZIA 06/26/2010  . CELLULITIS, LEG, RIGHT 03/12/2010  . SKIN RASH 03/12/2010  . PERIPHERAL EDEMA 01/05/2008  . LOOSE STOOLS 06/26/2010  . PROSTATE SPECIFIC ANTIGEN, ELEVATED 06/09/2007  . Long term (current) use of anticoagulants 11/06/2010   Past Surgical History  Procedure Date  . Appendectomy 1974  . Inguinal herniorrhapy     x 2  . S/p right hand surgury 2009    Dr. Izora Ribas  . S/p cardioversion 05/21/2008    reports that he has quit smoking. He does not have any smokeless tobacco history on file. He reports that he drinks alcohol. He reports that he does not use illicit drugs. family history includes Alzheimer's disease in his father; Cancer in his father and other; and Coronary artery disease in his other. No Known Allergies Current Outpatient  Prescriptions on File Prior to Visit  Medication Sig Dispense Refill  . allopurinol (ZYLOPRIM) 100 MG tablet TAKE 1 TABLET BY MOUTH ONCE A DAY  90 tablet  2  . diltiazem (CARDIZEM CD) 300 MG 24 hr capsule TAKE 1 CAPSULE BY MOUTH ONCE DAILY  90 capsule  2  . glimepiride (AMARYL) 1 MG tablet 1 tab by mouth in the AM, and 1/2 tab in the PM  135 tablet  3  . glucose blood (ONE TOUCH ULTRA TEST) test strip Use one strip two times a day to test blood glucose  200 each  3  . KLOR-CON M20 20 MEQ tablet TAKE 1 TABLET BY MOUTH ONCE A DAY  90 tablet  2  . lisinopril (PRINIVIL,ZESTRIL) 40 MG tablet TAKE 1 TABLET BY MOUTH ONCE DAILY  90 tablet  2  . metoprolol (LOPRESSOR) 50 MG tablet TAKE 1 TABLET BY MOUTH 2 TIMES A DAY  180 tablet  3  . simvastatin (ZOCOR) 40 MG tablet TAKE 1 TABLET BY MOUTH ONCE A DAY  90 tablet  2  . warfarin (COUMADIN) 5 MG tablet TAKE 1 TABLET BY MOUTH AS DIRECTED  90 tablet  0  . warfarin (COUMADIN) 5 MG tablet Take as directed by coumadin clinic  90 tablet  0  . diltiazem (DILACOR XR) 240 MG 24 hr capsule Take 1 capsule (240 mg total) by mouth daily.  90 capsule  3  . furosemide (LASIX) 80 MG tablet Take 1 tablet (80 mg total) by mouth 2 (two) times  daily.  180 tablet  3   Review of Systems  Constitutional: Negative for diaphoresis and unexpected weight change.  HENT: Negative for tinnitus.   Eyes: Negative for photophobia and visual disturbance.  Respiratory: Negative for choking and stridor.   Gastrointestinal: Negative for vomiting and blood in stool.  Genitourinary: Negative for hematuria and decreased urine volume.  Musculoskeletal: Negative for gait problem.  Skin: Negative for color change and wound.  Neurological: Negative for tremors and numbness.  Psychiatric/Behavioral: Negative for decreased concentration. The patient is not hyperactive.       Objective:   Physical Exam BP 140/90  Pulse 82  Temp 97.7 F (36.5 C) (Oral)  Ht 5\' 8"  (1.727 m)  Wt 293 lb 2 oz  (132.961 kg)  BMI 44.57 kg/m2  SpO2 90% Physical Exam  VS noted Constitutional: Pt appears well-developed and well-nourished.  HENT: Head: Normocephalic.  Right Ear: External ear normal.  Left Ear: External ear normal.  Eyes: Conjunctivae and EOM are normal. Pupils are equal, round, and reactive to light.  Neck: Normal range of motion. Neck supple.  Cardiovascular: Normal rate and irregular rhythm.   Pulmonary/Chest: Effort normal and breath sounds normal.  Abd:  Soft, NT, non-distended, + BS Neurological: Pt is alert. Not confused  Skin: Skin is warm. No erythema.  Psychiatric: Pt behavior is normal. Thought content normal.  Left knee with diffuse mild anterior tender, 1+ effusion, but FROM    Assessment & Plan:

## 2012-07-11 NOTE — Assessment & Plan Note (Signed)
stable overall by hx and exam, most recent data reviewed with pt, and pt to continue medical treatment as before Lab Results  Component Value Date   LDLCALC 55 01/06/2012

## 2012-07-11 NOTE — Assessment & Plan Note (Signed)
?   OA flare - for ortho eval

## 2012-07-12 DIAGNOSIS — IMO0002 Reserved for concepts with insufficient information to code with codable children: Secondary | ICD-10-CM | POA: Diagnosis not present

## 2012-07-12 DIAGNOSIS — M1710 Unilateral primary osteoarthritis, unspecified knee: Secondary | ICD-10-CM | POA: Diagnosis not present

## 2012-08-15 ENCOUNTER — Ambulatory Visit (INDEPENDENT_AMBULATORY_CARE_PROVIDER_SITE_OTHER): Payer: Medicare Other | Admitting: Cardiology

## 2012-08-15 ENCOUNTER — Encounter: Payer: Self-pay | Admitting: Cardiology

## 2012-08-15 VITALS — BP 131/83 | HR 60 | Wt 289.0 lb

## 2012-08-15 DIAGNOSIS — I4891 Unspecified atrial fibrillation: Secondary | ICD-10-CM | POA: Diagnosis not present

## 2012-08-15 DIAGNOSIS — E785 Hyperlipidemia, unspecified: Secondary | ICD-10-CM

## 2012-08-15 DIAGNOSIS — E663 Overweight: Secondary | ICD-10-CM | POA: Diagnosis not present

## 2012-08-15 DIAGNOSIS — I10 Essential (primary) hypertension: Secondary | ICD-10-CM | POA: Diagnosis not present

## 2012-08-15 NOTE — Assessment & Plan Note (Signed)
Continue statin. 

## 2012-08-15 NOTE — Assessment & Plan Note (Signed)
Continue present blood pressure medications. 

## 2012-08-15 NOTE — Assessment & Plan Note (Signed)
Continue present medications for rate control. Continue Coumadin. Hemoglobin monitored by primary care. Patient provided the names of NOAC agents. He will contact his insurance company and if they will cover we will consider changing.

## 2012-08-15 NOTE — Assessment & Plan Note (Signed)
Patient counseled on weight loss. 

## 2012-08-15 NOTE — Patient Instructions (Addendum)
Your physician wants you to follow-up in: ONE YEAR WITH DR Shelda Pal will receive a reminder letter in the mail two months in advance. If you don't receive a letter, please call our office to schedule the follow-up appointment.   XARELTO= PRADAXA= ELIQUIS TO REPLACE COUMADIN

## 2012-08-15 NOTE — Progress Notes (Signed)
HPI: [name] is a pleasant  gentleman who has a history of hypertension, hyperlipidemia, diabetes and permanent atrial fibrillation.  He has undergone attempted cardioversion previously, but he did not hold sinus rhythm.  His most recent Myoview on March 08, 2008 showed an ejection fraction of 54% and normal perfusion.  A previous echocardiogram in July 2009 showed normal LV function, mild RVE, and mild right atrial enlargement. Holter monitor in February of 2012 showed atrial fibrillation with a mildly decreased rate. Cardizem decreased. I last saw him in feb 2012. Since then he has dyspnea with more moderate activities but not with routine activities. It is relieved with rest. It is not associated with chest pain. There is no orthopnea, PND, paations, syncope or chest pain. He has chronic pedal edema which is unchanged. There's been no bleeding.  Current Outpatient Prescriptions  Medication Sig Dispense Refill  . allopurinol (ZYLOPRIM) 100 MG tablet TAKE 1 TABLET BY MOUTH ONCE A DAY  90 tablet  2  . diltiazem (CARDIZEM CD) 300 MG 24 hr capsule TAKE 1 CAPSULE BY MOUTH ONCE DAILY  90 capsule  2  . furosemide (LASIX) 80 MG tablet Take 1 tablet (80 mg total) by mouth 2 (two) times daily.  180 tablet  3  . glimepiride (AMARYL) 1 MG tablet 1 tab by mouth in the AM, and 1/2 tab in the PM  135 tablet  3  . glucose blood (ONE TOUCH ULTRA TEST) test strip Use one strip two times a day to test blood glucose  200 each  3  . KLOR-CON M20 20 MEQ tablet TAKE 1 TABLET BY MOUTH ONCE A DAY  90 tablet  2  . lisinopril (PRINIVIL,ZESTRIL) 40 MG tablet TAKE 1 TABLET BY MOUTH ONCE DAILY  90 tablet  2  . metoprolol (LOPRESSOR) 50 MG tablet TAKE 1 TABLET BY MOUTH 2 TIMES A DAY  180 tablet  3  . simvastatin (ZOCOR) 40 MG tablet TAKE 1 TABLET BY MOUTH ONCE A DAY  90 tablet  2  . warfarin (COUMADIN) 5 MG tablet TAKE 1 TABLET BY MOUTH AS DIRECTED  90 tablet  0     Past Medical History  Diagnosis Date  . DIABETES  MELLITUS, TYPE II 06/09/2007  . HYPERLIPIDEMIA 02/18/2007  . GOUT 06/09/2007  . Overweight 02/18/2007  . OBESITY HYPOVENTILATION SYNDROME 11/26/2008  . OBSTRUCTIVE SLEEP APNEA 07/02/2008  . COMMON MIGRAINE 06/09/2007  . Unspecified hearing loss 06/09/2007  . HYPERTENSION 02/18/2007  . Atrial fibrillation 10/16/2008  . BRONCHITIS, CHRONIC 01/02/2009  . SKIN RASH 03/12/2010  . PROSTATE SPECIFIC ANTIGEN, ELEVATED 06/09/2007  . Long term (current) use of anticoagulants 11/06/2010    Past Surgical History  Procedure Date  . Appendectomy 1974  . Inguinal herniorrhapy     x 2  . S/p right hand surgury 2009    Dr. Izora Ribas  . S/p cardioversion 05/21/2008    History   Social History  . Marital Status: Married    Spouse Name: N/A    Number of Children: 2  . Years of Education: N/A   Occupational History  . sheet metal Advice worker    Social History Main Topics  . Smoking status: Former Games developer  . Smokeless tobacco: Not on file  . Alcohol Use: Yes  . Drug Use: No  . Sexually Active: Not on file   Other Topics Concern  . Not on file   Social History Narrative  . No narrative on file    ROS: no  fevers or chills, productive cough, hemoptysis, dysphasia, odynophagia, melena, hematochezia, dysuria, hematuria, rash, seizure activity, orthopnea, PND, claudication. Remaining systems are negative.  Physical Exam: Well-developed obese in no acute distress.  Skin is warm and dry.  HEENT is normal.  Neck is supple.  Chest is clear to auscultation with normal expansion.  Cardiovascular exam is irregular Abdominal exam nontender or distended. No masses palpated. Extremities show chronic skin changes and 1+ edema. neuro grossly intact  ECG atrial fibrillation at a rate of 60. Right axis deviation. Nonspecific ST changes.

## 2012-08-20 ENCOUNTER — Other Ambulatory Visit: Payer: Self-pay | Admitting: Internal Medicine

## 2012-08-22 ENCOUNTER — Ambulatory Visit (INDEPENDENT_AMBULATORY_CARE_PROVIDER_SITE_OTHER): Payer: Medicare Other | Admitting: *Deleted

## 2012-08-22 DIAGNOSIS — I4891 Unspecified atrial fibrillation: Secondary | ICD-10-CM

## 2012-08-22 DIAGNOSIS — Z7901 Long term (current) use of anticoagulants: Secondary | ICD-10-CM | POA: Diagnosis not present

## 2012-08-22 LAB — POCT INR: INR: 2.8

## 2012-09-22 ENCOUNTER — Other Ambulatory Visit: Payer: Self-pay | Admitting: Internal Medicine

## 2012-09-22 ENCOUNTER — Other Ambulatory Visit: Payer: Self-pay | Admitting: Cardiology

## 2012-10-03 ENCOUNTER — Ambulatory Visit (INDEPENDENT_AMBULATORY_CARE_PROVIDER_SITE_OTHER): Payer: Medicare Other | Admitting: *Deleted

## 2012-10-03 DIAGNOSIS — I4891 Unspecified atrial fibrillation: Secondary | ICD-10-CM | POA: Diagnosis not present

## 2012-10-03 DIAGNOSIS — Z7901 Long term (current) use of anticoagulants: Secondary | ICD-10-CM

## 2012-11-14 ENCOUNTER — Ambulatory Visit (INDEPENDENT_AMBULATORY_CARE_PROVIDER_SITE_OTHER): Payer: Medicare Other

## 2012-11-14 DIAGNOSIS — I4891 Unspecified atrial fibrillation: Secondary | ICD-10-CM

## 2012-11-14 DIAGNOSIS — Z7901 Long term (current) use of anticoagulants: Secondary | ICD-10-CM | POA: Diagnosis not present

## 2012-11-14 LAB — POCT INR: INR: 2

## 2012-12-20 ENCOUNTER — Ambulatory Visit (INDEPENDENT_AMBULATORY_CARE_PROVIDER_SITE_OTHER): Payer: Medicare Other | Admitting: Internal Medicine

## 2012-12-20 ENCOUNTER — Ambulatory Visit (INDEPENDENT_AMBULATORY_CARE_PROVIDER_SITE_OTHER)
Admission: RE | Admit: 2012-12-20 | Discharge: 2012-12-20 | Disposition: A | Discharge status: Home / Self Care | Payer: Medicare Other | Source: Ambulatory Visit | Attending: Internal Medicine | Admitting: Internal Medicine

## 2012-12-20 ENCOUNTER — Other Ambulatory Visit (INDEPENDENT_AMBULATORY_CARE_PROVIDER_SITE_OTHER): Payer: Medicare Other

## 2012-12-20 ENCOUNTER — Encounter: Payer: Self-pay | Admitting: Internal Medicine

## 2012-12-20 VITALS — BP 130/82 | HR 89 | Temp 98.3°F | Ht 68.0 in | Wt 300.0 lb

## 2012-12-20 DIAGNOSIS — I10 Essential (primary) hypertension: Secondary | ICD-10-CM

## 2012-12-20 DIAGNOSIS — R609 Edema, unspecified: Secondary | ICD-10-CM

## 2012-12-20 DIAGNOSIS — R0989 Other specified symptoms and signs involving the circulatory and respiratory systems: Secondary | ICD-10-CM | POA: Diagnosis not present

## 2012-12-20 DIAGNOSIS — I4891 Unspecified atrial fibrillation: Secondary | ICD-10-CM

## 2012-12-20 DIAGNOSIS — R0609 Other forms of dyspnea: Secondary | ICD-10-CM | POA: Diagnosis not present

## 2012-12-20 DIAGNOSIS — R0902 Hypoxemia: Secondary | ICD-10-CM

## 2012-12-20 LAB — CBC WITH DIFFERENTIAL/PLATELET
Basophils Absolute: 0.1 10*3/uL (ref 0.0–0.1)
HCT: 51.3 % (ref 39.0–52.0)
Lymphs Abs: 2.4 10*3/uL (ref 0.7–4.0)
MCV: 84 fl (ref 78.0–100.0)
Monocytes Absolute: 0.9 10*3/uL (ref 0.1–1.0)
Platelets: 282 10*3/uL (ref 150.0–400.0)
RDW: 17.5 % — ABNORMAL HIGH (ref 11.5–14.6)

## 2012-12-20 LAB — LIPID PANEL: VLDL: 17 mg/dL (ref 0.0–40.0)

## 2012-12-20 LAB — HEMOGLOBIN A1C: Hgb A1c MFr Bld: 7.3 % — ABNORMAL HIGH (ref 4.6–6.5)

## 2012-12-20 LAB — URINALYSIS, ROUTINE W REFLEX MICROSCOPIC
Bilirubin Urine: NEGATIVE
Leukocytes, UA: NEGATIVE
Nitrite: NEGATIVE
Urobilinogen, UA: 0.2 (ref 0.0–1.0)

## 2012-12-20 LAB — HEPATIC FUNCTION PANEL
ALT: 15 U/L (ref 0–53)
AST: 18 U/L (ref 0–37)
Albumin: 3.1 g/dL — ABNORMAL LOW (ref 3.5–5.2)
Alkaline Phosphatase: 68 U/L (ref 39–117)
Total Bilirubin: 1.3 mg/dL — ABNORMAL HIGH (ref 0.3–1.2)

## 2012-12-20 LAB — BASIC METABOLIC PANEL
Chloride: 98 mEq/L (ref 96–112)
GFR: 93.02 mL/min (ref >60.00)
Glucose, Bld: 85 mg/dL (ref 70–99)
Potassium: 3.7 mEq/L (ref 3.5–5.1)
Sodium: 143 mEq/L (ref 135–145)

## 2012-12-20 MED ORDER — ALBUTEROL SULFATE HFA 108 (90 BASE) MCG/ACT IN AERS: 2.0000 | INHALATION_SPRAY | Freq: Four times a day (QID) | RESPIRATORY_TRACT | Status: DC | PRN

## 2012-12-20 MED ORDER — FUROSEMIDE 80 MG PO TABS: ORAL_TABLET | ORAL | Status: DC

## 2012-12-20 NOTE — Assessment & Plan Note (Addendum)
Only taking lasix 80 qd instead of bid I suspect as he has a significant diuretic effect with each dose by hx, will need to increased to 80 bid due to volume increased, ? pulm edema, also for cxr

## 2012-12-20 NOTE — Assessment & Plan Note (Addendum)
ECG reviewed as per emr, stable overall by history and exam, recent data reviewed with pt, and pt to continue medical treatment as before,  to f/u any worsening symptoms or concerns SpO2 Readings from Last 3 Encounters:  12/20/12 91%  07/11/12 90%  01/06/12 95%

## 2012-12-20 NOTE — Assessment & Plan Note (Signed)
ECG reviewed as per emr, last echo 2009 - for f/u echo, consider f/u with Dr Jens Som

## 2012-12-20 NOTE — Assessment & Plan Note (Addendum)
With o2 sat 80% with ambulation, has hx of COPD per duaghter though I dont see this on chart; will add albuterol MDI, home o2 continuous, ,refer back to Dr Edward Qualia  Note:  Total time for pt hx, exam, review of record with pt in the room, determination of diagnoses and plan for further eval and tx is > 40 min, with over 50% spent in coordination and counseling of patient

## 2012-12-20 NOTE — Progress Notes (Signed)
Subjective:    Patient ID: [name] [name], male    DOB: Oct 07, 1945, 67 y.o.   MRN: [id number]  HPI  Here to f/u;  4 wks ago had episode n/v (mild), crampy abd pains, mutl watery stools for 1 wk, and resolved but still since then with fatigue, occas abd pains, and  Legs swelling now to above the knees with wt gain gradually over the past month, no overt bleeding or bruising, last INR 2.0 first wk of April.  Has decreased appetite, just does not feel well with soft stools, not quite back to normal.  Main other c/os is 4 wks doe - Pt denies chest pain, increased sob at rest, wheezing, orthopnea, PND, palpitations, dizziness or syncope, but thinks o2 sat may be dropping with walking as well. Only taking his lasix 80 in the AM , not bid. Only using 3 L O2 at night currently  Pt denies fever, wt loss, night sweats or other constitutional symptoms.  Pt denies polydipsia, polyuria, or low sugar symptoms such as weakness or confusion improved with po intake.  Pt states overall good compliance with meds, trying to follow lower cholesterol, diabetic diet, wt overall stable but little exercise however.     Past Medical History  Diagnosis Date  . DIABETES MELLITUS, TYPE II 06/09/2007  . HYPERLIPIDEMIA 02/18/2007  . GOUT 06/09/2007  . Overweight 02/18/2007  . OBESITY HYPOVENTILATION SYNDROME 11/26/2008  . OBSTRUCTIVE SLEEP APNEA 07/02/2008  . COMMON MIGRAINE 06/09/2007  . Unspecified hearing loss 06/09/2007  . HYPERTENSION 02/18/2007  . Atrial fibrillation 10/16/2008  . BRONCHITIS, CHRONIC 01/02/2009  . SKIN RASH 03/12/2010  . PROSTATE SPECIFIC ANTIGEN, ELEVATED 06/09/2007  . Long term (current) use of anticoagulants 11/06/2010   Past Surgical History  Procedure Laterality Date  . Appendectomy  1974  . Inguinal herniorrhapy      x 2  . S/p right hand surgury  2009    Dr. Izora Ribas  . S/p cardioversion  05/21/2008    reports that he has quit smoking. He does not have any smokeless tobacco history on file. He reports  that  drinks alcohol. He reports that he does not use illicit drugs. family history includes Alzheimer's disease in his father; Cancer in his father and other; and Coronary artery disease in his other. No Known Allergies Current Outpatient Prescriptions on File Prior to Visit  Medication Sig Dispense Refill  . allopurinol (ZYLOPRIM) 100 MG tablet TAKE 1 TABLET BY MOUTH ONCE A DAY  90 tablet  3  . diltiazem (CARDIZEM CD) 300 MG 24 hr capsule TAKE 1 CAPSULE BY MOUTH ONCE DAILY  90 capsule  3  . glimepiride (AMARYL) 1 MG tablet TAKE 1 TABLET BY MOUTH ONCE DAILY  90 tablet  3  . glucose blood (ONE TOUCH ULTRA TEST) test strip Use one strip two times a day to test blood glucose  200 each  3  . lisinopril (PRINIVIL,ZESTRIL) 40 MG tablet TAKE 1 TABLET BY MOUTH ONCE DAILY  90 tablet  3  . metoprolol (LOPRESSOR) 50 MG tablet TAKE 1 TABLET BY MOUTH 2 TIMES A DAY  180 tablet  3  . potassium chloride SA (K-DUR,KLOR-CON) 20 MEQ tablet TAKE 1 TABLET BY MOUTH ONCE A DAY  90 tablet  3  . simvastatin (ZOCOR) 40 MG tablet TAKE 1 TABLET BY MOUTH ONCE A DAY  90 tablet  3  . warfarin (COUMADIN) 5 MG tablet Take as directed by coumadin clinic  90 tablet  1   No  current facility-administered medications on file prior to visit.   Review of Systems  Constitutional: Negative for unexpected weight change, or unusual diaphoresis  HENT: Negative for tinnitus.   Eyes: Negative for photophobia and visual disturbance.  Respiratory: Negative for choking and stridor.   Gastrointestinal: Negative for vomiting and blood in stool.  Genitourinary: Negative for hematuria and decreased urine volume.  Musculoskeletal: Negative for acute joint swelling except mild left knee s/p recent cortisone Skin: Negative for color change and wound.  Neurological: Negative for tremors and numbness other than noted  Psychiatric/Behavioral: Negative for decreased concentration or  hyperactivity.      Objective:   Physical Exam BP 130/82   Pulse 89  Temp(Src) 98.3 F (36.8 C) (Oral)  Ht 5\' 8"  (1.727 m)  Wt 300 lb (136.079 kg)  BMI 45.63 kg/m2  SpO2 91% VS noted, fatigued at rest, dyspneic mild with exertion, o2 sat 80% with walking 50 ft Constitutional: Pt appears well-developed and well-nourished.  HENT: Head: NCAT.  Right Ear: External ear normal.  Left Ear: External ear normal.  Eyes: Conjunctivae and EOM are normal. Pupils are equal, round, and reactive to light.  Neck: Normal range of motion. Neck supple.  Cardiovascular: Normal rate and irregular rhythm.   Pulmonary/Chest: Effort normal and breath sounds decresed bilat, no franke rales or wheezing.  Abd:  Soft, NT, non-distended, + BS Neurological: Pt is alert. Not confused  Skin: Skin is warm. No erythema or ulcer, but has 2+ edema to bilat mid thigh.  Psychiatric: Pt behavior is normal. Thought content normal.     Assessment & Plan:

## 2012-12-20 NOTE — Patient Instructions (Addendum)
Your oxygen falls down with walking to 80% today Please see the PCC's for home oxygen start Please take all new medication as prescribed - the inhaler as needed for shortness of breath Please continue all other medications as before, and refills have been done if requested - the lasix at 80 mg twice per day Please go to the XRAY Department in the Basement (go straight as you get off the elevator) for the x-ray testing You will be contacted regarding the referral for: echocardiogram, and referral to Dr Wendelyn Breslow Please go to the LAB in the Basement (turn left off the elevator) for the tests to be done today You will be contacted by phone if any changes need to be made immediately.  Otherwise, you will receive a letter about your results with an explanation, but please check with MyChart first. Thank you for enrolling in MyChart. Please follow the instructions below to securely access your online medical record. MyChart allows you to send messages to your doctor, view your test results, renew your prescriptions, schedule appointments, and more.

## 2012-12-20 NOTE — Assessment & Plan Note (Signed)
To add home o2 2l daytime continuous, 3L at night as per Dr Craige Cotta previously

## 2012-12-22 ENCOUNTER — Encounter: Payer: Self-pay | Admitting: Pulmonary Disease

## 2012-12-22 ENCOUNTER — Ambulatory Visit (INDEPENDENT_AMBULATORY_CARE_PROVIDER_SITE_OTHER): Payer: Medicare Other | Admitting: Pulmonary Disease

## 2012-12-22 VITALS — BP 142/96 | HR 92 | Temp 98.5°F | Ht 67.0 in | Wt 306.0 lb

## 2012-12-22 DIAGNOSIS — G4733 Obstructive sleep apnea (adult) (pediatric): Secondary | ICD-10-CM | POA: Diagnosis not present

## 2012-12-22 DIAGNOSIS — E662 Morbid (severe) obesity with alveolar hypoventilation: Secondary | ICD-10-CM | POA: Diagnosis not present

## 2012-12-22 DIAGNOSIS — R0609 Other forms of dyspnea: Secondary | ICD-10-CM

## 2012-12-22 DIAGNOSIS — I2781 Cor pulmonale (chronic): Secondary | ICD-10-CM | POA: Insufficient documentation

## 2012-12-22 DIAGNOSIS — I279 Pulmonary heart disease, unspecified: Secondary | ICD-10-CM

## 2012-12-22 DIAGNOSIS — E678 Other specified hyperalimentation: Secondary | ICD-10-CM

## 2012-12-22 DIAGNOSIS — E663 Overweight: Secondary | ICD-10-CM

## 2012-12-22 DIAGNOSIS — J42 Unspecified chronic bronchitis: Secondary | ICD-10-CM

## 2012-12-22 NOTE — Assessment & Plan Note (Signed)
Likely related to OSA/OHS.  Continue diuresis per primary care.  Will further assess for obstructive lung disease with PFT.

## 2012-12-22 NOTE — Assessment & Plan Note (Addendum)
Will get report from his BiPAP machine, and then determine if he needs adjustments to his set up.  Will arrange for him to get new mask and tubing.

## 2012-12-22 NOTE — Assessment & Plan Note (Signed)
He continues to have oxygen desaturation with exertion on 4 liters.  Will change his oxygen set up to  3 liters at rest, 5 liters with exertion, and 4 liters with sleep.  Will arrange for portable pulse oximeter so he can better monitor his oxygenation at home.  Will arrange for ONO with 4 liters and BiPAP.

## 2012-12-22 NOTE — Assessment & Plan Note (Signed)
Most likely related to obesity, deconditioning, and hypoxemia from OHS.  He likely has component of secondary pulmonary hypertension.  Echo has been ordered by his PCP.

## 2012-12-22 NOTE — Assessment & Plan Note (Signed)
He has prior history of tobacco use.  Previous spirometry was not suggestive of obstructive disease, but this was done several years ago.  Will repeat PFT to further assess.  Defer inhaler therapy for now.

## 2012-12-22 NOTE — Assessment & Plan Note (Addendum)
Explained how his weight is contributing to his breathing problems, and encouraged him to keep up with his activities as tolerated.

## 2012-12-22 NOTE — Patient Instructions (Signed)
Will schedule PFT (breathing test) Will get report from VPAP machine Will arrange for overnight oxygen test Will arrange for portable pulse oximeter Follow up in 8 weeks

## 2012-12-22 NOTE — Progress Notes (Signed)
Chief Complaint  Patient presents with  . Follow-up    Dr. Jonny Ruiz wanted the pt to be seen due to increased fluid retention and decreased O2 levels.    History of Present Illness: [name] [name] is a 67 y.o. male severe OSA, OHS, and chronic bronchitis with history of tobacco abuse.  I last saw [name] [name] in 2011.  He continues to use VPAP at night.  He has not received new supplies for several years.  He was not using oxygen during the day until recently.  He was getting headaches, confusion, and easy fatigue.  Family also reports he looked blue.  Since he started using oxygen these symptoms have improved.  He is no longer having trouble breathing at rest.  He still gets winded with minimal exertion.  He is using 3 liters oxygen at rest, and 4 liters oxygen with exertion and sleep.  He is not smoking cigarettes.  He gets occasional cough with clear sputum.  His throat gets dry, and he sips water.  He denies fever, wheeze, or hemoptysis.  He was getting chest tightness with exertion and sleep, but this is better since he started using oxygen more.  He has leg swelling, and has been taking lasix.  TESTS: PSG 06/18/08 >> AHI 64, BPAP 18/9 with 4 liters oxygen Spirometry 04/03/09 >> FEV1 2.08(69%), FEV1% 77   [name] [name]  has a past medical history of DIABETES MELLITUS, TYPE II (06/09/2007); HYPERLIPIDEMIA (02/18/2007); GOUT (06/09/2007); Overweight (02/18/2007); OBESITY HYPOVENTILATION SYNDROME (11/26/2008); OBSTRUCTIVE SLEEP APNEA (07/02/2008); COMMON MIGRAINE (06/09/2007); Unspecified hearing loss (06/09/2007); HYPERTENSION (02/18/2007); Atrial fibrillation (10/16/2008); BRONCHITIS, CHRONIC (01/02/2009); SKIN RASH (03/12/2010); PROSTATE SPECIFIC ANTIGEN, ELEVATED (06/09/2007); and Long term (current) use of anticoagulants (11/06/2010).  [name] [name]  has past surgical history that includes Appendectomy (1974); inguinal herniorrhapy; s/p right hand surgury (2009); and s/p Cardioversion (05/21/2008).  Prior to  Admission medications   Medication Sig Start Date End Date Taking? Authorizing Provider  albuterol (PROVENTIL HFA;VENTOLIN HFA) 108 (90 BASE) MCG/ACT inhaler Inhale 2 puffs into the lungs every 6 (six) hours as needed for wheezing. 12/20/12  Yes Corwin Levins, MD  allopurinol (ZYLOPRIM) 100 MG tablet TAKE 1 TABLET BY MOUTH ONCE A DAY 08/20/12  Yes Corwin Levins, MD  diltiazem (CARDIZEM CD) 300 MG 24 hr capsule TAKE 1 CAPSULE BY MOUTH ONCE DAILY 08/20/12  Yes Corwin Levins, MD  furosemide (LASIX) 80 MG tablet TAKE 1 TABLET BY MOUTH TWICE DAILY 12/20/12  Yes Corwin Levins, MD  glimepiride (AMARYL) 1 MG tablet  08/20/12  Yes Corwin Levins, MD  glucose blood (ONE TOUCH ULTRA TEST) test strip Use one strip two times a day to test blood glucose 12/28/10  Yes Corwin Levins, MD  lisinopril (PRINIVIL,ZESTRIL) 40 MG tablet TAKE 1 TABLET BY MOUTH ONCE DAILY 08/20/12  Yes Corwin Levins, MD  metoprolol (LOPRESSOR) 50 MG tablet TAKE 1 TABLET BY MOUTH 2 TIMES A DAY 07/02/12  Yes Lewayne Bunting, MD  potassium chloride SA (K-DUR,KLOR-CON) 20 MEQ tablet TAKE 1 TABLET BY MOUTH ONCE A DAY 08/20/12  Yes Corwin Levins, MD  simvastatin (ZOCOR) 40 MG tablet TAKE 1 TABLET BY MOUTH ONCE A DAY 08/20/12  Yes Corwin Levins, MD  warfarin (COUMADIN) 5 MG tablet Take as directed by coumadin clinic 09/25/12  Yes Lewayne Bunting, MD    No Known Allergies   Physical Exam:  General - No distress, wearing oxygen ENT - No sinus tenderness, MP  4, no oral exudate, no LAN Cardiac - s1s2 regular, no murmur Chest - Decreased breath sounds, no wheeze/rales Back - No focal tenderness Abd - Soft, non-tender Ext - wearing compression hose, 2+ edema b/l Neuro - Normal strength Skin - No rashes Psych - normal mood, and behavior  Dg Chest 2 View  12/20/2012   *RADIOLOGY REPORT*  Clinical Data: Dyspnea on exertion.  Hypoxemia.  CHEST - 2 VIEW  Comparison: 07/11/2008  Findings: There is a moderate cardiac enlargement.  Pulmonary vascular congestion is  noted. No overt edema or pleural effusion.  There is no airspace consolidation noted.  Spondylosis noted within the thoracic spine.  IMPRESSION:  1. Cardiac enlargement and pulmonary venous congestion.   Original Report Authenticated By: Signa Kell, M.D.   Lab Results  Component Value Date   WBC 11.4* 12/20/2012   HGB 16.5 12/20/2012   HCT 51.3 12/20/2012   MCV 84.0 12/20/2012   PLT 282.0 12/20/2012   Lab Results  Component Value Date   CREATININE 0.9 12/20/2012   BUN 9 12/20/2012   NA 143 12/20/2012   K 3.7 12/20/2012   CL 98 12/20/2012   CO2 38* 12/20/2012   Lab Results  Component Value Date   ALT 15 12/20/2012   AST 18 12/20/2012   ALKPHOS 68 12/20/2012   BILITOT 1.3* 12/20/2012      Assessment/Plan:  Coralyn Helling, MD [facility] Pager:  [phone]

## 2012-12-26 ENCOUNTER — Ambulatory Visit (INDEPENDENT_AMBULATORY_CARE_PROVIDER_SITE_OTHER): Payer: Medicare Other

## 2012-12-26 DIAGNOSIS — I4891 Unspecified atrial fibrillation: Secondary | ICD-10-CM | POA: Diagnosis not present

## 2012-12-26 DIAGNOSIS — Z7901 Long term (current) use of anticoagulants: Secondary | ICD-10-CM

## 2012-12-27 ENCOUNTER — Ambulatory Visit (HOSPITAL_COMMUNITY): Payer: Medicare Other | Attending: Cardiology | Admitting: Radiology

## 2012-12-27 ENCOUNTER — Telehealth: Payer: Self-pay | Admitting: Pulmonary Disease

## 2012-12-27 DIAGNOSIS — I10 Essential (primary) hypertension: Secondary | ICD-10-CM | POA: Diagnosis not present

## 2012-12-27 DIAGNOSIS — R0609 Other forms of dyspnea: Secondary | ICD-10-CM | POA: Diagnosis not present

## 2012-12-27 DIAGNOSIS — I4891 Unspecified atrial fibrillation: Secondary | ICD-10-CM

## 2012-12-27 DIAGNOSIS — E785 Hyperlipidemia, unspecified: Secondary | ICD-10-CM | POA: Insufficient documentation

## 2012-12-27 DIAGNOSIS — J449 Chronic obstructive pulmonary disease, unspecified: Secondary | ICD-10-CM | POA: Insufficient documentation

## 2012-12-27 DIAGNOSIS — R609 Edema, unspecified: Secondary | ICD-10-CM | POA: Diagnosis not present

## 2012-12-27 DIAGNOSIS — E669 Obesity, unspecified: Secondary | ICD-10-CM | POA: Insufficient documentation

## 2012-12-27 DIAGNOSIS — J4489 Other specified chronic obstructive pulmonary disease: Secondary | ICD-10-CM | POA: Insufficient documentation

## 2012-12-27 DIAGNOSIS — E119 Type 2 diabetes mellitus without complications: Secondary | ICD-10-CM | POA: Insufficient documentation

## 2012-12-27 DIAGNOSIS — Z87891 Personal history of nicotine dependence: Secondary | ICD-10-CM | POA: Insufficient documentation

## 2012-12-27 DIAGNOSIS — R0989 Other specified symptoms and signs involving the circulatory and respiratory systems: Secondary | ICD-10-CM | POA: Insufficient documentation

## 2012-12-27 NOTE — Telephone Encounter (Signed)
He uses supplemental oxygen 24/7.  He qualifies for handicap parking.  Will complete form when I return to office on 12/28/12.

## 2012-12-27 NOTE — Telephone Encounter (Signed)
I spoke with [name]. She is requesting handicap placard for pt. Please advise VS thanks

## 2012-12-27 NOTE — Progress Notes (Signed)
Echocardiogram performed.  

## 2012-12-27 NOTE — Telephone Encounter (Signed)
Daughter is aware and placard placed in VS look at. I advise dher will call once ready for p/u.

## 2012-12-28 NOTE — Telephone Encounter (Signed)
Spoke with patients daughter [name]  Aware the handicap placard has been placed upfront for pickup

## 2013-01-03 ENCOUNTER — Encounter: Payer: Self-pay | Admitting: Internal Medicine

## 2013-01-03 ENCOUNTER — Ambulatory Visit (INDEPENDENT_AMBULATORY_CARE_PROVIDER_SITE_OTHER): Payer: Medicare Other | Admitting: Internal Medicine

## 2013-01-03 VITALS — BP 122/80 | HR 73 | Temp 100.0°F | Wt 309.0 lb

## 2013-01-03 DIAGNOSIS — R609 Edema, unspecified: Secondary | ICD-10-CM | POA: Diagnosis not present

## 2013-01-03 DIAGNOSIS — E119 Type 2 diabetes mellitus without complications: Secondary | ICD-10-CM

## 2013-01-03 DIAGNOSIS — I509 Heart failure, unspecified: Secondary | ICD-10-CM | POA: Diagnosis not present

## 2013-01-03 DIAGNOSIS — I10 Essential (primary) hypertension: Secondary | ICD-10-CM | POA: Diagnosis not present

## 2013-01-03 DIAGNOSIS — I5033 Acute on chronic diastolic (congestive) heart failure: Secondary | ICD-10-CM | POA: Diagnosis not present

## 2013-01-03 MED ORDER — SPIRONOLACTONE 50 MG PO TABS: 50.0000 mg | ORAL_TABLET | Freq: Every day | ORAL | Status: DC

## 2013-01-03 MED ORDER — GLIMEPIRIDE 1 MG PO TABS: 1.0000 mg | ORAL_TABLET | Freq: Every day | ORAL | Status: DC

## 2013-01-03 NOTE — Assessment & Plan Note (Signed)
stable overall by history and exam, recent data reviewed with pt, and pt to continue medical treatment as before,  to f/u any worsening symptoms or concerns Lab Results  Component Value Date   HGBA1C 7.3* 12/20/2012    

## 2013-01-03 NOTE — Assessment & Plan Note (Signed)
stable overall by history and exam, recent data reviewed with pt, and pt to continue medical treatment as before,  to f/u any worsening symptoms or concerns BP Readings from Last 3 Encounters:  01/03/13 122/80  12/22/12 142/96  12/20/12 130/82

## 2013-01-03 NOTE — Assessment & Plan Note (Signed)
Worsening, with wt gain on lasix 80 bid, overall good compliacne, echo with EF 55%, unable to assess PA pressure, has dx chronic or pulmonale, for now to add aldactone 50 qd, cont lasix and all meds, cont home o2, to refer back to cardiology

## 2013-01-03 NOTE — Progress Notes (Signed)
Subjective:    Patient ID: [name] [name], male    DOB: Jul 24, 1946, 67 y.o.   MRN: [id number]  HPI  Here with family; wife and daughter, very concerned as LE edema little to no improved - infact seem worse with edema to above the knees now bilat despite good compliance with lasix, and wt increased , by chart wt increased from 300 to 309 since may 14, recent echo with OK EF at 55% but technically difficult - diastolic evaluation not able to be determined adequately. Pt denies chest pain, increased sob or doe, wheezing, orthopnea, PND, increased LE swelling, palpitations, dizziness or syncope, but has had increased nonprod cough.  Recent labs with baseline cr stable, no other signficant abnormals may 14, reviewed with pt/family today.  Note RA and RV dilated on echo, PA pressure not able to be determined, has chronc lung dz.  Has not slept flat for years due to back pain, sleeps in recliner with head up and feet up.  Pt denies polydipsia, polyuria, or low sugar symptoms such as weakness or confusion improved with po intake.  Pt states overall good compliance with meds, trying to follow lower cholesterol, diabetic diet, wt overall stable but little exercise however.    Pt denies new neurological symptoms such as new headache, or facial or extremity weakness or numbness Past Medical History  Diagnosis Date  . DIABETES MELLITUS, TYPE II 06/09/2007  . HYPERLIPIDEMIA 02/18/2007  . GOUT 06/09/2007  . Overweight(278.02) 02/18/2007  . OBESITY HYPOVENTILATION SYNDROME 11/26/2008  . OBSTRUCTIVE SLEEP APNEA 07/02/2008  . COMMON MIGRAINE 06/09/2007  . Unspecified hearing loss 06/09/2007  . HYPERTENSION 02/18/2007  . Atrial fibrillation 10/16/2008  . BRONCHITIS, CHRONIC 01/02/2009  . SKIN RASH 03/12/2010  . PROSTATE SPECIFIC ANTIGEN, ELEVATED 06/09/2007  . Long term (current) use of anticoagulants 11/06/2010   Past Surgical History  Procedure Laterality Date  . Appendectomy  1974  . Inguinal herniorrhapy      x 2  .  S/p right hand surgury  2009    Dr. Izora Ribas  . S/p cardioversion  05/21/2008    reports that he has quit smoking. His smoking use included Cigarettes. He has a 15 pack-year smoking history. He does not have any smokeless tobacco history on file. He reports that  drinks alcohol. He reports that he does not use illicit drugs. family history includes Alzheimer's disease in his father; Cancer in his father and other; and Coronary artery disease in his other. No Known Allergies Current Outpatient Prescriptions on File Prior to Visit  Medication Sig Dispense Refill  . albuterol (PROVENTIL HFA;VENTOLIN HFA) 108 (90 BASE) MCG/ACT inhaler Inhale 2 puffs into the lungs every 6 (six) hours as needed for wheezing.  1 Inhaler  11  . allopurinol (ZYLOPRIM) 100 MG tablet TAKE 1 TABLET BY MOUTH ONCE A DAY  90 tablet  3  . diltiazem (CARDIZEM CD) 300 MG 24 hr capsule TAKE 1 CAPSULE BY MOUTH ONCE DAILY  90 capsule  3  . furosemide (LASIX) 80 MG tablet TAKE 1 TABLET BY MOUTH TWICE DAILY  180 tablet  1  . glucose blood (ONE TOUCH ULTRA TEST) test strip Use one strip two times a day to test blood glucose  200 each  3  . lisinopril (PRINIVIL,ZESTRIL) 40 MG tablet TAKE 1 TABLET BY MOUTH ONCE DAILY  90 tablet  3  . metoprolol (LOPRESSOR) 50 MG tablet TAKE 1 TABLET BY MOUTH 2 TIMES A DAY  180 tablet  3  . potassium  chloride SA (K-DUR,KLOR-CON) 20 MEQ tablet TAKE 1 TABLET BY MOUTH ONCE A DAY  90 tablet  3  . simvastatin (ZOCOR) 40 MG tablet TAKE 1 TABLET BY MOUTH ONCE A DAY  90 tablet  3  . warfarin (COUMADIN) 5 MG tablet Take as directed by coumadin clinic  90 tablet  1   No current facility-administered medications on file prior to visit.   Review of Systems  Constitutional: Negative for unexpected weight change, or unusual diaphoresis  HENT: Negative for tinnitus.   Eyes: Negative for photophobia and visual disturbance.  Respiratory: Negative for choking and stridor.   Gastrointestinal: Negative for vomiting and  blood in stool.  Genitourinary: Negative for hematuria and decreased urine volume.  Musculoskeletal: Negative for acute joint swelling Skin: Negative for color change and wound.  Neurological: Negative for tremors and numbness other than noted  Psychiatric/Behavioral: Negative for decreased concentration or  hyperactivity.       Objective:   Physical Exam BP 122/80  Pulse 73  Temp(Src) 100 F (37.8 C) (Oral)  Wt 309 lb (140.161 kg)  BMI 48.38 kg/m2  SpO2 93% VS noted, morbid obese, on home o2 Constitutional: Pt appears well-developed and well-nourished.  HENT: Head: NCAT.  Right Ear: External ear normal.  Left Ear: External ear normal.  unable to assess CVP Eyes: Conjunctivae and EOM are normal. Pupils are equal, round, and reactive to light.  Neck: Normal range of motion. Neck supple.  Cardiovascular: Normal rate and regular rhythm.   Pulmonary/Chest: Effort normal and breath sounds decreased, no rales or wheezing  Abd:  Soft, NT, non-distended, + BS Neurological: Pt is alert. Not confused  Skin: Skin is warm. No erythema. LE 1+ edema to above knees, no scrotal edema Psychiatric: Pt behavior is normal. Thought content normal.     Assessment & Plan:

## 2013-01-03 NOTE — Patient Instructions (Signed)
Please take all new medication as prescribed - the aldactone Please continue all other medications as before, and refills have been done if requested. Please have the pharmacy call with any other refills you may need. You will be contacted regarding the referral for: Dr Jens Som Please return in 2 wks for LAB only if you are not able to see Dr Jens Som before then to check the potassium and kidney function Please keep your appointments with your specialists as you have planned - pulmonary Please return in 3 months, or sooner if needed, with Lab testing done 3-5 days before

## 2013-01-05 ENCOUNTER — Inpatient Hospital Stay (HOSPITAL_COMMUNITY)
Admission: AD | Admit: 2013-01-05 | Discharge: 2013-01-10 | DRG: 287 | Disposition: A | Discharge status: Home / Self Care | Payer: Medicare Other | Source: Ambulatory Visit | Attending: Cardiology | Admitting: Cardiology

## 2013-01-05 ENCOUNTER — Encounter: Payer: Self-pay | Admitting: Cardiology

## 2013-01-05 ENCOUNTER — Ambulatory Visit (INDEPENDENT_AMBULATORY_CARE_PROVIDER_SITE_OTHER): Payer: Medicare Other | Admitting: Cardiology

## 2013-01-05 ENCOUNTER — Encounter (HOSPITAL_COMMUNITY): Payer: Self-pay | Admitting: General Practice

## 2013-01-05 VITALS — BP 122/88 | HR 72 | Wt 306.0 lb

## 2013-01-05 DIAGNOSIS — I50811 Acute right heart failure: Secondary | ICD-10-CM

## 2013-01-05 DIAGNOSIS — I2789 Other specified pulmonary heart diseases: Secondary | ICD-10-CM | POA: Diagnosis present

## 2013-01-05 DIAGNOSIS — Z7901 Long term (current) use of anticoagulants: Secondary | ICD-10-CM | POA: Diagnosis not present

## 2013-01-05 DIAGNOSIS — I509 Heart failure, unspecified: Secondary | ICD-10-CM | POA: Diagnosis not present

## 2013-01-05 DIAGNOSIS — R0609 Other forms of dyspnea: Secondary | ICD-10-CM | POA: Diagnosis not present

## 2013-01-05 DIAGNOSIS — G4733 Obstructive sleep apnea (adult) (pediatric): Secondary | ICD-10-CM | POA: Diagnosis present

## 2013-01-05 DIAGNOSIS — J42 Unspecified chronic bronchitis: Secondary | ICD-10-CM | POA: Diagnosis present

## 2013-01-05 DIAGNOSIS — J90 Pleural effusion, not elsewhere classified: Secondary | ICD-10-CM | POA: Diagnosis not present

## 2013-01-05 DIAGNOSIS — Z9981 Dependence on supplemental oxygen: Secondary | ICD-10-CM | POA: Diagnosis not present

## 2013-01-05 DIAGNOSIS — Z87891 Personal history of nicotine dependence: Secondary | ICD-10-CM

## 2013-01-05 DIAGNOSIS — E662 Morbid (severe) obesity with alveolar hypoventilation: Secondary | ICD-10-CM | POA: Diagnosis present

## 2013-01-05 DIAGNOSIS — I10 Essential (primary) hypertension: Secondary | ICD-10-CM | POA: Diagnosis present

## 2013-01-05 DIAGNOSIS — I279 Pulmonary heart disease, unspecified: Secondary | ICD-10-CM | POA: Diagnosis not present

## 2013-01-05 DIAGNOSIS — E119 Type 2 diabetes mellitus without complications: Secondary | ICD-10-CM

## 2013-01-05 DIAGNOSIS — E785 Hyperlipidemia, unspecified: Secondary | ICD-10-CM | POA: Diagnosis present

## 2013-01-05 DIAGNOSIS — I872 Venous insufficiency (chronic) (peripheral): Secondary | ICD-10-CM | POA: Diagnosis present

## 2013-01-05 DIAGNOSIS — I4891 Unspecified atrial fibrillation: Secondary | ICD-10-CM | POA: Diagnosis not present

## 2013-01-05 DIAGNOSIS — H9190 Unspecified hearing loss, unspecified ear: Secondary | ICD-10-CM | POA: Diagnosis present

## 2013-01-05 DIAGNOSIS — Z6841 Body Mass Index (BMI) 40.0 and over, adult: Secondary | ICD-10-CM | POA: Diagnosis not present

## 2013-01-05 DIAGNOSIS — E8779 Other fluid overload: Secondary | ICD-10-CM | POA: Diagnosis not present

## 2013-01-05 DIAGNOSIS — M109 Gout, unspecified: Secondary | ICD-10-CM | POA: Diagnosis present

## 2013-01-05 DIAGNOSIS — R918 Other nonspecific abnormal finding of lung field: Secondary | ICD-10-CM | POA: Diagnosis not present

## 2013-01-05 HISTORY — DX: Acute right heart failure: I50.811

## 2013-01-05 HISTORY — DX: Unspecified osteoarthritis, unspecified site: M19.90

## 2013-01-05 HISTORY — DX: Morbid (severe) obesity due to excess calories: E66.01

## 2013-01-05 HISTORY — DX: Permanent atrial fibrillation: I48.21

## 2013-01-05 HISTORY — DX: Adverse effect of unspecified anesthetic, initial encounter: T41.45XA

## 2013-01-05 HISTORY — DX: Obstructive sleep apnea (adult) (pediatric): G47.33

## 2013-01-05 HISTORY — DX: Dependence on supplemental oxygen: Z99.81

## 2013-01-05 HISTORY — DX: Other complications of anesthesia, initial encounter: T88.59XA

## 2013-01-05 HISTORY — DX: Pulmonary hypertension, unspecified: I27.20

## 2013-01-05 LAB — COMPREHENSIVE METABOLIC PANEL
ALT: 12 U/L (ref 0–53)
AST: 15 U/L (ref 0–37)
CO2: 34 mEq/L — ABNORMAL HIGH (ref 19–32)
Calcium: 8.9 mg/dL (ref 8.4–10.5)
Chloride: 99 mEq/L (ref 96–112)
GFR calc Af Amer: >90 mL/min (ref >90)
GFR calc non Af Amer: 85 mL/min — ABNORMAL LOW (ref >90)
Glucose, Bld: 142 mg/dL — ABNORMAL HIGH (ref 70–99)
Sodium: 140 mEq/L (ref 135–145)
Total Bilirubin: 0.9 mg/dL (ref 0.3–1.2)

## 2013-01-05 LAB — CBC WITH DIFFERENTIAL/PLATELET
Basophils Absolute: 0.1 10*3/uL (ref 0.0–0.1)
Basophils Relative: 1 % (ref 0–1)
Eosinophils Absolute: 0.3 10*3/uL (ref 0.0–0.7)
MCH: 26.5 pg (ref 26.0–34.0)
MCHC: 31.7 g/dL (ref 30.0–36.0)
Neutro Abs: 9.1 10*3/uL — ABNORMAL HIGH (ref 1.7–7.7)
Neutrophils Relative %: 73 % (ref 43–77)
Platelets: 270 10*3/uL (ref 150–400)
RDW: 16.1 % — ABNORMAL HIGH (ref 11.5–15.5)

## 2013-01-05 LAB — PRO B NATRIURETIC PEPTIDE: Pro B Natriuretic peptide (BNP): 1119 pg/mL — ABNORMAL HIGH (ref 0–125)

## 2013-01-05 LAB — PROTIME-INR: Prothrombin Time: 21.1 seconds — ABNORMAL HIGH (ref 11.6–15.2)

## 2013-01-05 MED ORDER — DILTIAZEM HCL ER COATED BEADS 300 MG PO CP24
300.0000 mg | ORAL_CAPSULE | Freq: Every day | ORAL | Status: DC
  Administered 2013-01-06: 300 mg via ORAL
  Filled 2013-01-05 (×2): qty 1

## 2013-01-05 MED ORDER — ACETAMINOPHEN 325 MG PO TABS
650.0000 mg | ORAL_TABLET | ORAL | Status: DC | PRN
  Administered 2013-01-06: 650 mg via ORAL
  Filled 2013-01-05: qty 2

## 2013-01-05 MED ORDER — ALBUTEROL SULFATE HFA 108 (90 BASE) MCG/ACT IN AERS
2.0000 | INHALATION_SPRAY | Freq: Four times a day (QID) | RESPIRATORY_TRACT | Status: DC | PRN
  Filled 2013-01-05: qty 6.7

## 2013-01-05 MED ORDER — HEPARIN (PORCINE) IN NACL 100-0.45 UNIT/ML-% IJ SOLN
2250.0000 [IU]/h | INTRAMUSCULAR | Status: DC
  Administered 2013-01-05: 1400 [IU]/h via INTRAVENOUS
  Administered 2013-01-06: 2000 [IU]/h via INTRAVENOUS
  Administered 2013-01-07 – 2013-01-08 (×2): 2250 [IU]/h via INTRAVENOUS
  Filled 2013-01-05 (×11): qty 250

## 2013-01-05 MED ORDER — POTASSIUM CHLORIDE CRYS ER 20 MEQ PO TBCR
20.0000 meq | EXTENDED_RELEASE_TABLET | Freq: Every day | ORAL | Status: DC
  Administered 2013-01-06 – 2013-01-10 (×5): 20 meq via ORAL
  Filled 2013-01-05 (×6): qty 1

## 2013-01-05 MED ORDER — SODIUM CHLORIDE 0.9 % IJ SOLN
3.0000 mL | Freq: Two times a day (BID) | INTRAMUSCULAR | Status: DC
  Administered 2013-01-05 – 2013-01-10 (×3): 3 mL via INTRAVENOUS

## 2013-01-05 MED ORDER — SPIRONOLACTONE 50 MG PO TABS
50.0000 mg | ORAL_TABLET | Freq: Every day | ORAL | Status: DC
  Administered 2013-01-06 – 2013-01-10 (×5): 50 mg via ORAL
  Filled 2013-01-05 (×5): qty 1

## 2013-01-05 MED ORDER — ONDANSETRON HCL 4 MG/2ML IJ SOLN: 4.0000 mg | Freq: Four times a day (QID) | INTRAMUSCULAR | Status: DC | PRN

## 2013-01-05 MED ORDER — INSULIN ASPART 100 UNIT/ML ~~LOC~~ SOLN
0.0000 [IU] | Freq: Three times a day (TID) | SUBCUTANEOUS | Status: DC
  Administered 2013-01-06 – 2013-01-09 (×3): 1 [IU] via SUBCUTANEOUS
  Administered 2013-01-10: 2 [IU] via SUBCUTANEOUS

## 2013-01-05 MED ORDER — LISINOPRIL 40 MG PO TABS
40.0000 mg | ORAL_TABLET | Freq: Every day | ORAL | Status: DC
  Administered 2013-01-06 – 2013-01-10 (×5): 40 mg via ORAL
  Filled 2013-01-05 (×5): qty 1

## 2013-01-05 MED ORDER — ATORVASTATIN CALCIUM 20 MG PO TABS
20.0000 mg | ORAL_TABLET | Freq: Every day | ORAL | Status: DC
  Administered 2013-01-05 – 2013-01-09 (×5): 20 mg via ORAL
  Filled 2013-01-05 (×6): qty 1

## 2013-01-05 MED ORDER — ALLOPURINOL 100 MG PO TABS
100.0000 mg | ORAL_TABLET | Freq: Every day | ORAL | Status: DC
  Administered 2013-01-06 – 2013-01-10 (×5): 100 mg via ORAL
  Filled 2013-01-05 (×5): qty 1

## 2013-01-05 MED ORDER — FUROSEMIDE 10 MG/ML IJ SOLN
120.0000 mg | Freq: Two times a day (BID) | INTRAVENOUS | Status: DC
  Administered 2013-01-05 – 2013-01-08 (×7): 120 mg via INTRAVENOUS
  Filled 2013-01-05 (×9): qty 12

## 2013-01-05 MED ORDER — SODIUM CHLORIDE 0.9 % IV SOLN: 250.0000 mL | INTRAVENOUS | Status: DC | PRN

## 2013-01-05 MED ORDER — GLIMEPIRIDE 1 MG PO TABS
0.5000 mg | ORAL_TABLET | Freq: Every day | ORAL | Status: DC
  Administered 2013-01-05 – 2013-01-09 (×4): 0.5 mg via ORAL
  Filled 2013-01-05 (×6): qty 0.5

## 2013-01-05 MED ORDER — METOPROLOL TARTRATE 50 MG PO TABS
50.0000 mg | ORAL_TABLET | Freq: Two times a day (BID) | ORAL | Status: DC
  Administered 2013-01-05 – 2013-01-06 (×2): 50 mg via ORAL
  Filled 2013-01-05 (×4): qty 1

## 2013-01-05 MED ORDER — SODIUM CHLORIDE 0.9 % IJ SOLN: 3.0000 mL | INTRAMUSCULAR | Status: DC | PRN

## 2013-01-05 MED ORDER — GLIMEPIRIDE 1 MG PO TABS
1.0000 mg | ORAL_TABLET | Freq: Every day | ORAL | Status: DC
  Administered 2013-01-06 – 2013-01-10 (×4): 1 mg via ORAL
  Filled 2013-01-05 (×7): qty 1

## 2013-01-05 MED ORDER — POTASSIUM CHLORIDE CRYS ER 20 MEQ PO TBCR: 20.0000 meq | EXTENDED_RELEASE_TABLET | Freq: Two times a day (BID) | ORAL | Status: DC

## 2013-01-05 NOTE — Assessment & Plan Note (Signed)
Continue statin. 

## 2013-01-05 NOTE — Progress Notes (Signed)
HPI: [name] is a pleasant gentleman who has a history of hypertension, hyperlipidemia, diabetes and permanent atrial fibrillation. He has undergone attempted cardioversion previously, but he did not hold sinus rhythm. His most recent Myoview on March 08, 2008 showed an ejection fraction of 54% and normal perfusion. A previous holter monitor in February of 2012 showed atrial fibrillation with a mildly decreased rate. Cardizem decreased. Echo in May 2014 showed normal LV function, moderate to severe LAE, mild RAE/RVE and reduced RV function. I last saw him in Jan 2014. Since then, he has had worsening DOE and pedal edema. Spironolactone added to his medical regimen by primary care. He now has severe dyspnea on exertion and can only walk 10 feet. He has chronic orthopnea. His pedal edema is markedly worsened he now is having abdominal wall edema. No exertional chest pain. No palpitations or syncope.   Current Outpatient Prescriptions  Medication Sig Dispense Refill  . albuterol (PROVENTIL HFA;VENTOLIN HFA) 108 (90 BASE) MCG/ACT inhaler Inhale 2 puffs into the lungs every 6 (six) hours as needed for wheezing.  1 Inhaler  11  . allopurinol (ZYLOPRIM) 100 MG tablet TAKE 1 TABLET BY MOUTH ONCE A DAY  90 tablet  3  . diltiazem (CARDIZEM CD) 300 MG 24 hr capsule TAKE 1 CAPSULE BY MOUTH ONCE DAILY  90 capsule  3  . furosemide (LASIX) 80 MG tablet TAKE 1 TABLET BY MOUTH TWICE DAILY  180 tablet  1  . glimepiride (AMARYL) 1 MG tablet 1 1/2 tab po qd      . glucose blood (ONE TOUCH ULTRA TEST) test strip Use one strip two times a day to test blood glucose  200 each  3  . lisinopril (PRINIVIL,ZESTRIL) 40 MG tablet TAKE 1 TABLET BY MOUTH ONCE DAILY  90 tablet  3  . metoprolol (LOPRESSOR) 50 MG tablet TAKE 1 TABLET BY MOUTH 2 TIMES A DAY  180 tablet  3  . potassium chloride SA (K-DUR,KLOR-CON) 20 MEQ tablet TAKE 1 TABLET BY MOUTH ONCE A DAY  90 tablet  3  . simvastatin (ZOCOR) 40 MG tablet TAKE 1 TABLET BY MOUTH  ONCE A DAY  90 tablet  3  . spironolactone (ALDACTONE) 50 MG tablet Take 1 tablet (50 mg total) by mouth daily.  90 tablet  3  . warfarin (COUMADIN) 5 MG tablet Take as directed by coumadin clinic  90 tablet  1   No current facility-administered medications for this visit.     Past Medical History  Diagnosis Date  . DIABETES MELLITUS, TYPE II 06/09/2007  . HYPERLIPIDEMIA 02/18/2007  . GOUT 06/09/2007  . Overweight(278.02) 02/18/2007  . OBESITY HYPOVENTILATION SYNDROME 11/26/2008  . OBSTRUCTIVE SLEEP APNEA 07/02/2008  . COMMON MIGRAINE 06/09/2007  . Unspecified hearing loss 06/09/2007  . HYPERTENSION 02/18/2007  . Atrial fibrillation 10/16/2008  . BRONCHITIS, CHRONIC 01/02/2009  . SKIN RASH 03/12/2010  . PROSTATE SPECIFIC ANTIGEN, ELEVATED 06/09/2007  . Long term (current) use of anticoagulants 11/06/2010    Past Surgical History  Procedure Laterality Date  . Appendectomy  1974  . Inguinal herniorrhapy      x 2  . S/p right hand surgery  2009    Dr. Izora Ribas  . S/p cardioversion  05/21/2008    History   Social History  . Marital Status: Married    Spouse Name: N/A    Number of Children: 2  . Years of Education: N/A   Occupational History  . sheet metal Advice worker  Social History Main Topics  . Smoking status: Former Smoker -- 1.00 packs/day for 15 years    Types: Cigarettes  . Smokeless tobacco: Not on file     Comment: Pt quit smoking 40 years ago  . Alcohol Use: No  . Drug Use: No  . Sexually Active: Not on file   Other Topics Concern  . Not on file   Social History Narrative  . No narrative on file    ROS: no fevers or chills, productive cough, hemoptysis, dysphasia, odynophagia, melena, hematochezia, dysuria, hematuria, rash, seizure activity, claudication. Remaining systems are negative.  Physical Exam: Well-developed obese in no acute distress.  Skin is warm and dry.  HEENT is normal.  Neck is supple. Positive JVD Back normal. Chest is clear to  auscultation with normal expansion.  Cardiovascular exam is irregular, no gallops Abdominal exam nontender or distended. No masses palpated. Positive abdominal wall edema Extremities show 3+ edema with chronic skin changes. neuro grossly intact

## 2013-01-05 NOTE — Progress Notes (Signed)
ANTICOAGULATION CONSULT NOTE - Follow Up Consult  Pharmacy Consult for heparin Indication: chest pain/ACS  No Known Allergies  Patient Measurements: Height: 5\' 8"  (172.7 cm) Weight: 306 lb (138.8 kg) IBW/kg (Calculated) : 68.4 Heparin Dosing Weight: 103 kg  Vital Signs: BP: 157/92 mmHg (05/30 1817) Pulse Rate: 75 (05/30 1817)  Labs:  Recent Labs  01/05/13 2021  HGB 14.2  HCT 44.8  PLT 270  LABPROT 21.1*  INR 1.90*    Estimated Creatinine Clearance: 108.8 ml/min (by C-G formula based on Cr of 0.9).    Assessment: 76yom with Hx HTN, HLD, DM, Afin on Coumadin as outpatient with admission INR 1.9, admitted for worsening DOE and LE edema.  Coumadin will be held and heparin started.  CBC stable Goal of Therapy:  Heparin level 0.3-0.7 units/ml Monitor platelets by anticoagulation protocol: Yes   Plan:  No bolus given as INR 1.9 Heparin drip 1400 uts/hr Daily CBC, HL  Leota Sauers Pharm.D. CPP, BCPS Clinical Pharmacist [phone] 01/05/2013 9:09 PM

## 2013-01-05 NOTE — Assessment & Plan Note (Signed)
Patient is markedly volume overloaded. His recent echocardiogram was technically difficult but showed preserved LV function. There was right-sided enlargement and reduced RV function. The patient most likely has significant pulmonary hypertension which is multifactorial including obstructive sleep apnea, obesity hypoventilation syndrome and possible pulmonary venous hypertension. Continue home oxygen and CPAP. I will admit for IV diuresis. Begin Lasix 120 mg IV twice a day. Continue spironolactone. Follow renal function closely. I will hold his Coumadin and begin heparin when INR is less than 2. We will plan right heart catheterization early next week. He may benefit from CHF clinic in the future.

## 2013-01-05 NOTE — Assessment & Plan Note (Signed)
Patient has permanent atrial fibrillation. Continue calcium blocker and beta blocker for rate control. Hold Coumadin in anticipation of right heart catheterization. Resume following procedure.

## 2013-01-05 NOTE — Progress Notes (Signed)
Pt set up on BiPap S/T-D. Settings: IPAP: 6 EPAP: 6 W/ 4L O2 bled in through medium FFM. Pt tolerating well at this time. RT will continue to monitor.

## 2013-01-05 NOTE — Assessment & Plan Note (Signed)
Continue CPAP.  

## 2013-01-05 NOTE — Assessment & Plan Note (Signed)
Continue present medications and follow CBGs. 

## 2013-01-05 NOTE — Assessment & Plan Note (Signed)
Patient will need weight loss and he was counseled today.

## 2013-01-05 NOTE — Assessment & Plan Note (Signed)
Continue present blood pressure medications. 

## 2013-01-05 NOTE — H&P (Signed)
See H&P below by Dr. Jens Som (admitted from office). _________________     HPI: [name] is a pleasant gentleman who has a history of hypertension, hyperlipidemia, diabetes and permanent atrial fibrillation. He has undergone attempted cardioversion previously, but he did not hold sinus rhythm. His most recent Myoview on March 08, 2008 showed an ejection fraction of 54% and normal perfusion. A previous holter monitor in February of 2012 showed atrial fibrillation with a mildly decreased rate. Cardizem decreased. Echo in May 2014 showed normal LV function, moderate to severe LAE, mild RAE/RVE and reduced RV function. I last saw him in Jan 2014. Since then, he has had worsening DOE and pedal edema. Spironolactone added to his medical regimen by primary care. He now has severe dyspnea on exertion and can only walk 10 feet. He has chronic orthopnea. His pedal edema is markedly worsened he now is having abdominal wall edema. No exertional chest pain. No palpitations or syncope.      Current Outpatient Prescriptions   Medication  Sig  Dispense  Refill   .  albuterol (PROVENTIL HFA;VENTOLIN HFA) 108 (90 BASE) MCG/ACT inhaler  Inhale 2 puffs into the lungs every 6 (six) hours as needed for wheezing.   1 Inhaler   11   .  allopurinol (ZYLOPRIM) 100 MG tablet  TAKE 1 TABLET BY MOUTH ONCE A DAY   90 tablet   3   .  diltiazem (CARDIZEM CD) 300 MG 24 hr capsule  TAKE 1 CAPSULE BY MOUTH ONCE DAILY   90 capsule   3   .  furosemide (LASIX) 80 MG tablet  TAKE 1 TABLET BY MOUTH TWICE DAILY   180 tablet   1   .  glimepiride (AMARYL) 1 MG tablet  1 1/2 tab po qd         .  glucose blood (ONE TOUCH ULTRA TEST) test strip  Use one strip two times a day to test blood glucose   200 each   3   .  lisinopril (PRINIVIL,ZESTRIL) 40 MG tablet  TAKE 1 TABLET BY MOUTH ONCE DAILY   90 tablet   3   .  metoprolol (LOPRESSOR) 50 MG tablet  TAKE 1 TABLET BY MOUTH 2 TIMES A DAY   180 tablet   3   .  potassium chloride SA  (K-DUR,KLOR-CON) 20 MEQ tablet  TAKE 1 TABLET BY MOUTH ONCE A DAY   90 tablet   3   .  simvastatin (ZOCOR) 40 MG tablet  TAKE 1 TABLET BY MOUTH ONCE A DAY   90 tablet   3   .  spironolactone (ALDACTONE) 50 MG tablet  Take 1 tablet (50 mg total) by mouth daily.   90 tablet   3   .  warfarin (COUMADIN) 5 MG tablet  Take as directed by coumadin clinic   90 tablet   1       No current facility-administered medications for this visit.           Past Medical History   Diagnosis  Date   .  DIABETES MELLITUS, TYPE II  06/09/2007   .  HYPERLIPIDEMIA  02/18/2007   .  GOUT  06/09/2007   .  Overweight(278.02)  02/18/2007   .  OBESITY HYPOVENTILATION SYNDROME  11/26/2008   .  OBSTRUCTIVE SLEEP APNEA  07/02/2008   .  COMMON MIGRAINE  06/09/2007   .  Unspecified hearing loss  06/09/2007   .  HYPERTENSION  02/18/2007   .  Atrial fibrillation  10/16/2008   .  BRONCHITIS, CHRONIC  01/02/2009   .  SKIN RASH  03/12/2010   .  PROSTATE SPECIFIC ANTIGEN, ELEVATED  06/09/2007   .  Long term (current) use of anticoagulants  11/06/2010         Past Surgical History   Procedure  Laterality  Date   .  Appendectomy    1974   .  Inguinal herniorrhapy           x 2   .  S/p right hand surgery    2009       Dr. Izora Ribas   .  S/p cardioversion    05/21/2008         History       Social History   .  Marital Status:  Married       Spouse Name:  N/A       Number of Children:  2   .  Years of Education:  N/A       Occupational History   .  sheet metal Advice worker         Social History Main Topics   .  Smoking status:  Former Smoker -- 1.00 packs/day for 15 years       Types:  Cigarettes   .  Smokeless tobacco:  Not on file         Comment: Pt quit smoking 40 years ago   .  Alcohol Use:  No   .  Drug Use:  No   .  Sexually Active:  Not on file       Other Topics  Concern   .  Not on file       Social History Narrative   .  No narrative on file        ROS: no fevers or chills,  productive cough, hemoptysis, dysphasia, odynophagia, melena, hematochezia, dysuria, hematuria, rash, seizure activity, claudication. Remaining systems are negative.   Physical Exam: Well-developed obese in no acute distress.   Skin is warm and dry.   HEENT is normal.   Neck is supple. Positive JVD Back normal. Chest is clear to auscultation with normal expansion.   Cardiovascular exam is irregular, no gallops Abdominal exam nontender or distended. No masses palpated. Positive abdominal wall edema Extremities show 3+ edema with chronic skin changes. neuro grossly intact                      Acute right-sided CHF (congestive heart failure) - Lewayne Bunting, MD at 01/05/2013  5:08 PM    Status: Written Related Problem: Acute right-sided CHF (congestive heart failure)           Patient is markedly volume overloaded. His recent echocardiogram was technically difficult but showed preserved LV function. There was right-sided enlargement and reduced RV function. The patient most likely has significant pulmonary hypertension which is multifactorial including obstructive sleep apnea, obesity hypoventilation syndrome and possible pulmonary venous hypertension. Continue home oxygen and CPAP. I will admit for IV diuresis. Begin Lasix 120 mg IV twice a day. Continue spironolactone. Follow renal function closely. I will hold his Coumadin and begin heparin when INR is less than 2. We will plan right heart catheterization early next week. He may benefit from CHF clinic in the future.         Atrial fibrillation - Lewayne Bunting, MD at 01/05/2013  5:08 PM    Status: Written Related Problem:  Atrial fibrillation           Patient has permanent atrial fibrillation. Continue calcium blocker and beta blocker for rate control. Hold Coumadin in anticipation of right heart catheterization. Resume following procedure.         DIABETES MELLITUS, TYPE II - Lewayne Bunting, MD at 01/05/2013  5:09 PM     Status: Written Related Problem: DIABETES MELLITUS, TYPE II           Continue present medicationsand follow CBGs.         HYPERLIPIDEMIA - Lewayne Bunting, MD at 01/05/2013  5:09 PM    Status: Written Related Problem: HYPERLIPIDEMIA           Continue statin.         HYPERTENSION - Lewayne Bunting, MD at 01/05/2013  5:09 PM    Status: Written Related Problem: HYPERTENSION           Continue present blood pressure medications.         OBSTRUCTIVE SLEEP APNEA - Lewayne Bunting, MD at 01/05/2013  5:09 PM    Status: Written Related Problem: OBSTRUCTIVE SLEEP APNEA           Continue CPAP.         Severe obesity (BMI >= 40) - Lewayne Bunting, MD at 01/05/2013  5:10 PM    Status: Written Related Problem: Severe obesity (BMI >= 40)           Patient will need weight loss and he was counseled today.    Olga Millers

## 2013-01-06 ENCOUNTER — Inpatient Hospital Stay (HOSPITAL_COMMUNITY): Payer: Medicare Other

## 2013-01-06 DIAGNOSIS — R918 Other nonspecific abnormal finding of lung field: Secondary | ICD-10-CM | POA: Diagnosis not present

## 2013-01-06 DIAGNOSIS — J90 Pleural effusion, not elsewhere classified: Secondary | ICD-10-CM | POA: Diagnosis not present

## 2013-01-06 DIAGNOSIS — I279 Pulmonary heart disease, unspecified: Secondary | ICD-10-CM | POA: Diagnosis not present

## 2013-01-06 LAB — CBC
HCT: 44.2 % (ref 39.0–52.0)
Hemoglobin: 14 g/dL (ref 13.0–17.0)
MCV: 84.8 fL (ref 78.0–100.0)
Platelets: 239 10*3/uL (ref 150–400)
RBC: 5.21 MIL/uL (ref 4.22–5.81)
WBC: 11.3 10*3/uL — ABNORMAL HIGH (ref 4.0–10.5)

## 2013-01-06 LAB — BASIC METABOLIC PANEL
BUN: 13 mg/dL (ref 6–23)
CO2: 35 mEq/L — ABNORMAL HIGH (ref 19–32)
Chloride: 100 mEq/L (ref 96–112)
Creatinine, Ser: 0.91 mg/dL (ref 0.50–1.35)
GFR calc Af Amer: >90 mL/min (ref >90)
Potassium: 4 mEq/L (ref 3.5–5.1)

## 2013-01-06 LAB — PROTIME-INR: INR: 1.68 — ABNORMAL HIGH (ref 0.00–1.49)

## 2013-01-06 LAB — GLUCOSE, CAPILLARY: Glucose-Capillary: 108 mg/dL — ABNORMAL HIGH (ref 70–99)

## 2013-01-06 LAB — TSH: TSH: 2.571 u[IU]/mL (ref 0.350–4.500)

## 2013-01-06 LAB — HEPARIN LEVEL (UNFRACTIONATED): Heparin Unfractionated: 0.27 IU/mL — ABNORMAL LOW (ref 0.30–0.70)

## 2013-01-06 MED ORDER — METOPROLOL TARTRATE 50 MG PO TABS
50.0000 mg | ORAL_TABLET | Freq: Two times a day (BID) | ORAL | Status: DC
  Administered 2013-01-07 – 2013-01-10 (×7): 50 mg via ORAL
  Filled 2013-01-06 (×9): qty 1

## 2013-01-06 MED ORDER — HEPARIN BOLUS VIA INFUSION
3000.0000 [IU] | Freq: Once | INTRAVENOUS | Status: DC
  Filled 2013-01-06: qty 3000

## 2013-01-06 NOTE — Progress Notes (Signed)
Subjective:  He is feeling better today and diuresed significantly since yesterday. His weight is down 8 pounds. He was started on heparin because of the subtherapeutic INR.  Objective:  Vital Signs in the last 24 hours: BP 128/79  Pulse 64  Temp(Src) 98.1 F (36.7 C) (Axillary)  Resp 20  Ht 5\' 8"  (1.727 m)  Wt 135.2 kg (298 lb 1 oz)  BMI 45.33 kg/m2  SpO2 98%  Physical Exam: Very large obese white male sitting in bed side chair without acute distress Lungs: Reduced breath sounds bilaterally Cardiac: Irregular rhythm, normal S1 and S2, no S3 Abdomen:  Very large Extremities:  3+ edema with changes of chronic venous insufficiency  Intake/Output from previous day: 05/30 0701 - 05/31 0700 In: -  Out: 2240 [Urine:2240] Weight Filed Weights   01/05/13 1700 01/06/13 0444  Weight: 138.8 kg (306 lb) 135.2 kg (298 lb 1 oz)    Lab Results: Basic Metabolic Panel:  Recent Labs  16/10/96 2021 01/06/13 0350  NA 140 142  K 3.8 4.0  CL 99 100  CO2 34* 35*  GLUCOSE 142* 135*  BUN 13 13  CREATININE 0.92 0.91    CBC:  Recent Labs  01/05/13 2021 01/06/13 0350  WBC 12.5* 11.3*  NEUTROABS 9.1*  --   HGB 14.2 14.0  HCT 44.8 44.2  MCV 83.7 84.8  PLT 270 239    BNP    Component Value Date/Time   PROBNP 1119.0* 01/05/2013 2031    Telemetry: Atrial fibrillation with controlled ventricular response  Assessment/Plan:  1. Acute right heart failure with normal LV function previously 2. Obstructive sleep apnea 3. Morbid obesity 4. Permanent atrial fibrillation  5. Long-term anticoagulation with warfarin   recommendations:  Continue diuresis. He will probably be ready for a right heart cath on Monday.   Darden Palmer  MD [facility]  01/06/2013, 11:32 AM

## 2013-01-06 NOTE — Progress Notes (Signed)
Spoke to Dr. Donnie Aho regarding patient's HR 38. Patient sitting in chair and is asymptomatic. Order given to hold metoprolol if HR <60. Will continue to monitor. [name] [name]

## 2013-01-06 NOTE — Progress Notes (Signed)
Placed patient on CPAP at 6cm with oxygen set at 4lpm

## 2013-01-06 NOTE — Progress Notes (Signed)
ANTICOAGULATION CONSULT NOTE  Pharmacy Consult for heparin Indication: ACS  No Known Allergies  Patient Measurements: Height: 5\' 8"  (172.7 cm) Weight: 298 lb 1 oz (135.2 kg) IBW/kg (Calculated) : 68.4 Heparin Dosing Weight: 103 kg  Vital Signs: Temp: 97.8 F (36.6 C) (05/31 1357) Temp src: Oral (05/31 1357) BP: 106/51 mmHg (05/31 1357) Pulse Rate: 87 (05/31 1357)  Labs:  Recent Labs  01/05/13 2021 01/06/13 0350 01/06/13 1409  HGB 14.2 14.0  --   HCT 44.8 44.2  --   PLT 270 239  --   LABPROT 21.1* 19.2*  --   INR 1.90* 1.68*  --   HEPARINUNFRC  --  <0.10* <0.10*  CREATININE 0.92 0.91  --     Estimated Creatinine Clearance: 106 ml/min (by C-G formula based on Cr of 0.91).  Assessment: 67 yo male with h/o Afib, Coumadin on hold for cath Monday. Heparin infusion continues for ACS. Noted heparin level remains below goal. CBC is stable, no bleeding reported. Coumadin PTA, INR now down to 1.7   Goal of Therapy:  Heparin level 0.3-0.7 units/ml Monitor platelets by anticoagulation protocol: Yes   Plan:  - Increase heparin infusion rate to 2000 units/hr (24ml/hr) - Will check heparin level 6h after dose change - Will f/up daily heparin level and CBC  Thanks, Signora Zucco K. Allena Katz, PharmD, BCPS.  Clinical Pharmacist Pager [phone]. 01/06/2013 3:06 PM

## 2013-01-06 NOTE — Progress Notes (Signed)
ANTICOAGULATION CONSULT NOTE  Pharmacy Consult for heparin Indication: ACS  No Known Allergies  Patient Measurements: Height: 5\' 8"  (172.7 cm) Weight: 298 lb 1 oz (135.2 kg) IBW/kg (Calculated) : 68.4 Heparin Dosing Weight: 103 kg  Vital Signs: Temp: 98.3 F (36.8 C) (05/31 2024) Temp src: Oral (05/31 2024) BP: 118/63 mmHg (05/31 2024) Pulse Rate: 47 (05/31 2209)  Labs:  Recent Labs  01/05/13 2021 01/06/13 0350 01/06/13 1409 01/06/13 2217  HGB 14.2 14.0  --   --   HCT 44.8 44.2  --   --   PLT 270 239  --   --   LABPROT 21.1* 19.2*  --   --   INR 1.90* 1.68*  --   --   HEPARINUNFRC  --  <0.10* <0.10* 0.27*  CREATININE 0.92 0.91  --   --     Estimated Creatinine Clearance: 106 ml/min (by C-G formula based on Cr of 0.91).  Assessment: 67 yo male with h/o Afib, Coumadin on hold for cath Monday. Heparin infusion continues for ACS. Noted heparin level remains below goal. CBC is stable, no bleeding reported. Coumadin PTA, INR now down to 1.68.   Heparin level (0.27) is below-goal on 2000 units/hr. No problem with line / infusion and no bleeding per RN.   Goal of Therapy:  Heparin level 0.3-0.7 units/ml Monitor platelets by anticoagulation protocol: Yes   Plan:  1. Increase IV heparin to 2250 units/hr.  2. Heparin level in 6 hours  Lorre Munroe, PharmD 01/06/2013 10:46 PM

## 2013-01-06 NOTE — Progress Notes (Signed)
Ambulated 350 ft with patient per request on 4L [place]. HR increased to 63 while ambulating. Patient denies any distress. Returned to bedside chair in room w/out incidence. Family and call bell near. HR returned to 40's while resting. Will continue to monitor. Mamie Levers

## 2013-01-06 NOTE — Progress Notes (Signed)
ANTICOAGULATION CONSULT NOTE  Pharmacy Consult for heparin Indication: h/o Afib   No Known Allergies  Patient Measurements: Height: 5\' 8"  (172.7 cm) Weight: 298 lb 1 oz (135.2 kg) IBW/kg (Calculated) : 68.4 Heparin Dosing Weight: 103 kg  Vital Signs: Temp: 98.1 F (36.7 C) (05/31 0444) Temp src: Axillary (05/31 0444) BP: 134/79 mmHg (05/31 0444) Pulse Rate: 64 (05/31 0444)  Labs:  Recent Labs  01/05/13 2021 01/06/13 0350  HGB 14.2 14.0  HCT 44.8 44.2  PLT 270 239  LABPROT 21.1* 19.2*  INR 1.90* 1.68*  HEPARINUNFRC  --  <0.10*  CREATININE 0.92  --     Estimated Creatinine Clearance: 104.8 ml/min (by C-G formula based on Cr of 0.92).    Assessment: 67 yo male with h/o Afib, Coumadin on hold for cath Monday, for heparin   Goal of Therapy:  Heparin level 0.3-0.7 units/ml Monitor platelets by anticoagulation protocol: Yes   Plan:  Increase Heparin 1700 units/hr Check heparin level in 8 hours.  Geannie Risen, PharmD, BCPS  01/06/2013 5:44 AM

## 2013-01-07 DIAGNOSIS — I279 Pulmonary heart disease, unspecified: Secondary | ICD-10-CM | POA: Diagnosis not present

## 2013-01-07 LAB — GLUCOSE, CAPILLARY
Glucose-Capillary: 99 mg/dL (ref 70–99)
Glucose-Capillary: 88 mg/dL (ref 70–99)

## 2013-01-07 LAB — BASIC METABOLIC PANEL
CO2: 36 mEq/L — ABNORMAL HIGH (ref 19–32)
Calcium: 9.2 mg/dL (ref 8.4–10.5)
Creatinine, Ser: 0.85 mg/dL (ref 0.50–1.35)
GFR calc non Af Amer: 88 mL/min — ABNORMAL LOW (ref >90)
Glucose, Bld: 117 mg/dL — ABNORMAL HIGH (ref 70–99)
Sodium: 140 mEq/L (ref 135–145)

## 2013-01-07 LAB — PROTIME-INR
INR: 1.39 (ref 0.00–1.49)
Prothrombin Time: 16.7 seconds — ABNORMAL HIGH (ref 11.6–15.2)

## 2013-01-07 LAB — CBC
Hemoglobin: 14.7 g/dL (ref 13.0–17.0)
MCH: 26.8 pg (ref 26.0–34.0)
MCHC: 31.5 g/dL (ref 30.0–36.0)
MCV: 85.1 fL (ref 78.0–100.0)
RBC: 5.49 MIL/uL (ref 4.22–5.81)

## 2013-01-07 MED ORDER — SODIUM CHLORIDE 0.9 % IV SOLN: INTRAVENOUS | Status: DC

## 2013-01-07 NOTE — Progress Notes (Signed)
ANTICOAGULATION CONSULT NOTE  Pharmacy Consult for heparin Indication: ACS  No Known Allergies  Labs:  Recent Labs  01/05/13 2021  01/06/13 0350 01/06/13 1409 01/06/13 2217 01/07/13 0425  HGB 14.2  --  14.0  --   --  14.7  HCT 44.8  --  44.2  --   --  46.7  PLT 270  --  239  --   --  233  LABPROT 21.1*  --  19.2*  --   --  16.7*  INR 1.90*  --  1.68*  --   --  1.39  HEPARINUNFRC  --   < > <0.10* <0.10* 0.27* 0.37  CREATININE 0.92  --  0.91  --   --  0.85  < > = values in this interval not displayed.  Estimated Creatinine Clearance: 112.6 ml/min (by C-G formula based on Cr of 0.85).  Assessment: 67 yo male with h/o Afib, Coumadin on hold for cath Monday. Heparin infusion continues for ACS. CBC is stable, no bleeding reported. Coumadin PTA, INR now down to 1.39  Heparin level (0.37) is at-goal on 2250 units/hr.   Goal of Therapy:  Heparin level 0.3-0.7 units/ml Monitor platelets by anticoagulation protocol: Yes   Plan:  1. Continue heparin at 2250 units / hr 2. Follow up AM labs  Thank you. Okey Regal, PharmD [phone]  01/07/2013 11:31 AM

## 2013-01-07 NOTE — Progress Notes (Addendum)
Subjective:  The patient feeling better today with less dyspnea. He has continued to diurese and has diuresed 12 pounds. Creatinine has gotten better now. Continue diuresis and plan right heart cath tomorrow. Noted significant low heart rate yesterday.  Objective:  Vital Signs in the last 24 hours: BP 145/91  Pulse 93  Temp(Src) 97.6 F (36.4 C) (Oral)  Resp 20  Ht 5\' 8"  (1.727 m)  Wt 133.448 kg (294 lb 3.2 oz)  BMI 44.74 kg/m2  SpO2 100%  Physical Exam: Very large obese white male sitting in bed side chair without acute distress Lungs: Reduced breath sounds bilaterally Cardiac: Irregular rhythm, normal S1 and S2, no S3 Abdomen:  Very large Extremities:  3+ edema with changes of chronic venous insufficiency  Intake/Output from previous day: 05/31 0701 - 06/01 0700 In: 480 [P.O.:480] Out: 2750 [Urine:2750] Weight Filed Weights   01/05/13 1700 01/06/13 0444 01/07/13 0522  Weight: 138.8 kg (306 lb) 135.2 kg (298 lb 1 oz) 133.448 kg (294 lb 3.2 oz)    Lab Results: Basic Metabolic Panel:  Recent Labs  16/10/96 0350 01/07/13 0425  NA 142 140  K 4.0 3.5  CL 100 97  CO2 35* 36*  GLUCOSE 135* 117*  BUN 13 17  CREATININE 0.91 0.85    CBC:  Recent Labs  01/05/13 2021 01/06/13 0350 01/07/13 0425  WBC 12.5* 11.3* 11.4*  NEUTROABS 9.1*  --   --   HGB 14.2 14.0 14.7  HCT 44.8 44.2 46.7  MCV 83.7 84.8 85.1  PLT 270 239 233    BNP    Component Value Date/Time   PROBNP 1119.0* 01/05/2013 2031    Telemetry: Atrial fibrillation with controlled ventricular response  Assessment/Plan:  1. Acute right heart failure with normal LV function previously 2. Obstructive sleep apnea 3. Morbid obesity 4. Permanent atrial fibrillation  5. Long-term anticoagulation with warfarin   recommendations:  Continue diuresis.  N.p.o.  for right heart cath tomorrow. Currently on heparin. Because of the daytime bradycardia noted yesterday and low heart rate will hold diltiazem  this morning.   Darden Palmer  MD [facility]  01/07/2013, 10:16 AM

## 2013-01-08 ENCOUNTER — Encounter (HOSPITAL_COMMUNITY)
Admission: AD | Disposition: A | Discharge status: Home / Self Care | Payer: Self-pay | Source: Ambulatory Visit | Attending: Cardiology

## 2013-01-08 DIAGNOSIS — R0609 Other forms of dyspnea: Secondary | ICD-10-CM | POA: Diagnosis not present

## 2013-01-08 DIAGNOSIS — I4891 Unspecified atrial fibrillation: Secondary | ICD-10-CM

## 2013-01-08 DIAGNOSIS — R0989 Other specified symptoms and signs involving the circulatory and respiratory systems: Secondary | ICD-10-CM

## 2013-01-08 HISTORY — PX: RIGHT HEART CATHETERIZATION: SHX5447

## 2013-01-08 LAB — BASIC METABOLIC PANEL
CO2: 38 mEq/L — ABNORMAL HIGH (ref 19–32)
Calcium: 9.4 mg/dL (ref 8.4–10.5)
Chloride: 94 mEq/L — ABNORMAL LOW (ref 96–112)
Creatinine, Ser: 0.82 mg/dL (ref 0.50–1.35)
Glucose, Bld: 105 mg/dL — ABNORMAL HIGH (ref 70–99)

## 2013-01-08 LAB — GLUCOSE, CAPILLARY
Glucose-Capillary: 95 mg/dL (ref 70–99)
Glucose-Capillary: 97 mg/dL (ref 70–99)
Glucose-Capillary: 81 mg/dL (ref 70–99)

## 2013-01-08 LAB — CBC
Hemoglobin: 15.2 g/dL (ref 13.0–17.0)
MCH: 26.9 pg (ref 26.0–34.0)
MCV: 85 fL (ref 78.0–100.0)
RBC: 5.65 MIL/uL (ref 4.22–5.81)
WBC: 11 10*3/uL — ABNORMAL HIGH (ref 4.0–10.5)

## 2013-01-08 LAB — PROTIME-INR: INR: 1.28 (ref 0.00–1.49)

## 2013-01-08 LAB — POCT I-STAT 3, VENOUS BLOOD GAS (G3P V): Acid-Base Excess: 12 mmol/L — ABNORMAL HIGH (ref 0.0–2.0)

## 2013-01-08 SURGERY — RIGHT HEART CATH
Anesthesia: LOCAL

## 2013-01-08 MED ORDER — WARFARIN SODIUM 5 MG PO TABS
5.0000 mg | ORAL_TABLET | Freq: Once | ORAL | Status: AC
  Administered 2013-01-08: 5 mg via ORAL
  Filled 2013-01-08: qty 1

## 2013-01-08 MED ORDER — WARFARIN - PHARMACIST DOSING INPATIENT: Freq: Every day | Status: DC

## 2013-01-08 MED ORDER — SODIUM CHLORIDE 0.9 % IV SOLN
INTRAVENOUS | Status: DC
  Administered 2013-01-08 (×2): via INTRAVENOUS

## 2013-01-08 MED ORDER — DILTIAZEM HCL ER COATED BEADS 180 MG PO CP24
180.0000 mg | ORAL_CAPSULE | Freq: Every day | ORAL | Status: DC
  Administered 2013-01-08 – 2013-01-10 (×3): 180 mg via ORAL
  Filled 2013-01-08 (×3): qty 1

## 2013-01-08 MED ORDER — ACETAMINOPHEN 325 MG PO TABS: 650.0000 mg | ORAL_TABLET | ORAL | Status: DC | PRN

## 2013-01-08 MED ORDER — ONDANSETRON HCL 4 MG/2ML IJ SOLN: 4.0000 mg | Freq: Four times a day (QID) | INTRAMUSCULAR | Status: DC | PRN

## 2013-01-08 NOTE — Progress Notes (Signed)
Pt and spouse informed the nurse that the patient has developed a rash on his abdm. The nurse checks the patient's abdm and observes an extensive rash. The Patient informed the nurse that the rash started upon the IV Heparin administration. The nurse clarified that the patient had not had the rash prior to the administration of the IV heparin. The nurse contacts Dr. Antoine Poche who instructs the nurse to turn off the heparin. The nurse performed as was instructed. Harmon Pier

## 2013-01-08 NOTE — CV Procedure (Signed)
    Right Cardiac Cath Note  ANIEL HUBBLE [id number] 01-07-1946  Procedure: Right Heart Cardiac Catheterization Note Indications: chronic dyspnea  Procedure Details Consent: Obtained Time Out: Verified patient identification, verified procedure, site/side was marked, verified correct patient position, special equipment/implants available, Radiology Safety Procedures followed,  medications/allergies/relevent history reviewed, required imaging and test results available.  Performed   Medications:  The right femoral vein was easily canulated using a modified Seldinger technique.  Hemodynamics:   RA: 15 RV: 44/11 PCWP: 21 PA:  41/30  Cardiac Output   Thermodilution: 4.27   Index = 1.89  Fick : 4.49   Index 1.8  Arterial Sat: 95 % (by pulse oxymetry) PA Sat: 61% .  Complications: No apparent complications Patient did tolerate procedure well.  Contrast used: 0  Conclusions:   1. Elevated right heart pressures as above   Vesta Mixer, Montez Hageman., MD, [facility] 01/08/2013, 2:22 PM Office - [phone] Pager [phone]

## 2013-01-08 NOTE — Progress Notes (Signed)
UR COMPLETED  

## 2013-01-08 NOTE — Progress Notes (Addendum)
ANTICOAGULATION CONSULT NOTE - Follow Up Consult  Pharmacy Consult for Heparin Indication: atrial fibrillation  No Known Allergies  Patient Measurements: Height: 5\' 8"  (172.7 cm) Weight: 285 lb 4.8 oz (129.411 kg) IBW/kg (Calculated) : 68.4 Heparin Dosing Weight: 100 kg  Vital Signs: Temp: 98.9 F (37.2 C) (06/02 0604) Temp src: Oral (06/02 0604) BP: 149/93 mmHg (06/02 0604) Pulse Rate: 108 (06/02 0604)  Labs:  Recent Labs  01/06/13 0350  01/06/13 2217 01/07/13 0425 01/08/13 0410  HGB 14.0  --   --  14.7 15.2  HCT 44.2  --   --  46.7 48.0  PLT 239  --   --  233 249  LABPROT 19.2*  --   --  16.7* 15.7*  INR 1.68*  --   --  1.39 1.28  HEPARINUNFRC <0.10*  < > 0.27* 0.37 0.37  CREATININE 0.91  --   --  0.85 0.82  < > = values in this interval not displayed.  Estimated Creatinine Clearance: 114.7 ml/min (by C-G formula based on Cr of 0.82).  Assessment:  Heparin level remains therapeutic on 2250 units/hr. CBC stable. Coumadin on hold for right heart cath today. INR down to 1.28.  Goal of Therapy:  Heparin level 0.3-0.7 units/ml Monitor platelets by anticoagulation protocol: Yes   Plan:   Continue heparin drip at 2250 units/hr.  Daily heparin level, CBC and PT/INR.  Will follow up post-cath.  Follow up Coumadin plans.  Dennie Fetters, [place] Pager: [phone] 01/08/2013,1:21 PM  Addendum, post-cath:  - Heparin drip already resumed at prior rate of 2250 units/hr.  - RN reports groin site without bleeding or hematoma.  - Spoke briefly with Dr. Elease Hashimoto.  OK to resume Coumadin tonight.  Usually takes 2.5 mg on MWF, 5 mg on TTSS.  Will begin with Coumadin 5 mg tonight.   - Next heparin level, PT/INR and CBC in am.  Hilarie Fredrickson, RPh 01/08/2013 5:27 PM

## 2013-01-08 NOTE — Progress Notes (Addendum)
Subjective:  Dyspnea continues to improve; no chest pain.  Objective:  Vital Signs in the last 24 hours: BP 149/93  Pulse 108  Temp(Src) 98.9 F (37.2 C) (Oral)  Resp 18  Ht 5\' 8"  (1.727 m)  Wt 285 lb 4.8 oz (129.411 kg)  BMI 43.39 kg/m2  SpO2 97%  Physical Exam: Obese white male sitting in bed side chair without acute distress Neck: supple Lungs: Reduced breath sounds bilaterally Cardiac: Irregular rhythm, normal S1 and S2, no S3 Abdomen:  Very large Extremities:  2+ edema with changes of chronic venous insufficiency  Intake/Output from previous day: 06/01 0701 - 06/02 0700 In: 960 [P.O.:960] Out: 3500 [Urine:3500] Weight Filed Weights   01/06/13 0444 01/07/13 0522 01/08/13 0604  Weight: 298 lb 1 oz (135.2 kg) 294 lb 3.2 oz (133.448 kg) 285 lb 4.8 oz (129.411 kg)    Lab Results: Basic Metabolic Panel:  Recent Labs  16/10/96 0425 01/08/13 0410  NA 140 142  K 3.5 3.8  CL 97 94*  CO2 36* 38*  GLUCOSE 117* 105*  BUN 17 17  CREATININE 0.85 0.82    CBC:  Recent Labs  01/05/13 2021  01/07/13 0425 01/08/13 0410  WBC 12.5*  < > 11.4* 11.0*  NEUTROABS 9.1*  --   --   --   HGB 14.2  < > 14.7 15.2  HCT 44.8  < > 46.7 48.0  MCV 83.7  < > 85.1 85.0  PLT 270  < > 233 249  < > = values in this interval not displayed.  BNP    Component Value Date/Time   PROBNP 1119.0* 01/05/2013 2031    Telemetry: Atrial fibrillation with mildly elevated ventricular response  Assessment/Plan:  1. Acute right heart failure with normal LV function previously 2. Obstructive sleep apnea 3. Morbid obesity 4. Permanent atrial fibrillation 5. Long-term anticoagulation with warfarin 6. Pulmonary hypertension  Recommendations:  Continue diuresis.  Follow renal function; overall improving. RHC today. Resume heparin/coumadin following cath. Resume cardizem at 180 mg po daily for improved rate control.   Olga Millers MD [facility]  01/08/2013, 7:31 AM

## 2013-01-09 ENCOUNTER — Ambulatory Visit: Payer: Medicare Other | Admitting: Internal Medicine

## 2013-01-09 DIAGNOSIS — I4891 Unspecified atrial fibrillation: Secondary | ICD-10-CM | POA: Diagnosis not present

## 2013-01-09 DIAGNOSIS — I509 Heart failure, unspecified: Secondary | ICD-10-CM | POA: Diagnosis not present

## 2013-01-09 LAB — GLUCOSE, CAPILLARY
Glucose-Capillary: 98 mg/dL (ref 70–99)
Glucose-Capillary: 120 mg/dL — ABNORMAL HIGH (ref 70–99)
Glucose-Capillary: 125 mg/dL — ABNORMAL HIGH (ref 70–99)

## 2013-01-09 LAB — CBC
Hemoglobin: 14.7 g/dL (ref 13.0–17.0)
MCH: 26.6 pg (ref 26.0–34.0)
MCV: 85.3 fL (ref 78.0–100.0)
RBC: 5.52 MIL/uL (ref 4.22–5.81)

## 2013-01-09 LAB — BASIC METABOLIC PANEL
CO2: 38 mEq/L — ABNORMAL HIGH (ref 19–32)
Calcium: 9.2 mg/dL (ref 8.4–10.5)
Chloride: 97 mEq/L (ref 96–112)
Creatinine, Ser: 0.82 mg/dL (ref 0.50–1.35)
Glucose, Bld: 92 mg/dL (ref 70–99)

## 2013-01-09 MED ORDER — DIPHENHYDRAMINE HCL 50 MG/ML IJ SOLN
25.0000 mg | Freq: Once | INTRAMUSCULAR | Status: AC
  Administered 2013-01-09: 25 mg via INTRAVENOUS
  Filled 2013-01-09: qty 1

## 2013-01-09 MED ORDER — FAMOTIDINE IN NACL 20-0.9 MG/50ML-% IV SOLN
20.0000 mg | Freq: Once | INTRAVENOUS | Status: AC
  Administered 2013-01-09: 20 mg via INTRAVENOUS
  Filled 2013-01-09: qty 50

## 2013-01-09 MED ORDER — DIPHENHYDRAMINE HCL 25 MG PO CAPS: 25.0000 mg | ORAL_CAPSULE | ORAL | Status: DC | PRN

## 2013-01-09 MED ORDER — APIXABAN 5 MG PO TABS
5.0000 mg | ORAL_TABLET | Freq: Two times a day (BID) | ORAL | Status: DC
  Administered 2013-01-09 – 2013-01-10 (×3): 5 mg via ORAL
  Filled 2013-01-09 (×5): qty 1

## 2013-01-09 MED ORDER — FUROSEMIDE 10 MG/ML IJ SOLN
80.0000 mg | Freq: Two times a day (BID) | INTRAMUSCULAR | Status: DC
  Administered 2013-01-09 – 2013-01-10 (×3): 80 mg via INTRAVENOUS
  Filled 2013-01-09 (×4): qty 8

## 2013-01-09 NOTE — Progress Notes (Signed)
PA paged and aware, Pt' rash on abdomen back and getting bigger. Pt c/o itching. Rash is not raised but does have petechiae present. Pt also Had a 2.08 sec pause and was asymptomatic. ORders given and activated. Will continue to monitor.

## 2013-01-09 NOTE — Progress Notes (Signed)
Subjective:  Dyspnea continues to improve; no chest pain. Rash over abdomen improving  Objective:  Vital Signs in the last 24 hours: BP 110/73  Pulse 90  Temp(Src) 98.7 F (37.1 C) (Axillary)  Resp 18  Ht 5\' 8"  (1.727 m)  Wt 278 lb (126.1 kg)  BMI 42.28 kg/m2  SpO2 97%  Physical Exam: Obese white male without acute distress Neck: supple Lungs: Reduced breath sounds bilaterally Cardiac: Irregular rhythm, normal S1 and S2, no S3 Abdomen:  Very large; right groin with no hematoma Extremities:  1+ edema with changes of chronic venous insufficiency  Intake/Output from previous day: 06/02 0701 - 06/03 0700 In: 1995.1 [P.O.:360; I.V.:1635.1] Out: 2385 [Urine:2385] Weight Filed Weights   01/07/13 0522 01/08/13 0604 01/09/13 0430  Weight: 294 lb 3.2 oz (133.448 kg) 285 lb 4.8 oz (129.411 kg) 278 lb (126.1 kg)    Lab Results: Basic Metabolic Panel:  Recent Labs  16/10/96 0410 01/09/13 0440  NA 142 141  K 3.8 3.8  CL 94* 97  CO2 38* 38*  GLUCOSE 105* 92  BUN 17 17  CREATININE 0.82 0.82    CBC:  Recent Labs  01/08/13 0410 01/09/13 0440  WBC 11.0* 10.3  HGB 15.2 14.7  HCT 48.0 47.1  MCV 85.0 85.3  PLT 249 241    BNP    Component Value Date/Time   PROBNP 1119.0* 01/05/2013 2031    Telemetry: Atrial fibrillation with controlled ventricular response  Assessment/Plan:  1. Acute right heart failure with normal LV function 2. Obstructive sleep apnea 3. Morbid obesity 4. Permanent atrial fibrillation 5. Long-term anticoagulation 6. Pulmonary hypertension  Recommendations:  Continue diuresis today (decrease lasix to 80 mg po BID; will most likely DC in AM on po diuretics; much improved.  Follow renal function. Results of RHC noted. Continue present meds for rate control of atrial fibrillation. ? Rash with heparin. Will continue off. DC coumadin and treat with apixaban 5 mg po BID.   Olga Millers MD [facility]  01/09/2013, 6:37 AM

## 2013-01-10 ENCOUNTER — Encounter (HOSPITAL_COMMUNITY): Payer: Self-pay | Admitting: Physician Assistant

## 2013-01-10 ENCOUNTER — Other Ambulatory Visit: Payer: Self-pay | Admitting: Physician Assistant

## 2013-01-10 ENCOUNTER — Telehealth: Payer: Self-pay | Admitting: Cardiology

## 2013-01-10 DIAGNOSIS — I509 Heart failure, unspecified: Secondary | ICD-10-CM | POA: Diagnosis not present

## 2013-01-10 DIAGNOSIS — I4891 Unspecified atrial fibrillation: Secondary | ICD-10-CM | POA: Diagnosis not present

## 2013-01-10 DIAGNOSIS — I50810 Right heart failure, unspecified: Secondary | ICD-10-CM

## 2013-01-10 LAB — BASIC METABOLIC PANEL
CO2: 37 mEq/L — ABNORMAL HIGH (ref 19–32)
GFR calc non Af Amer: 86 mL/min — ABNORMAL LOW (ref >90)
Glucose, Bld: 110 mg/dL — ABNORMAL HIGH (ref 70–99)
Potassium: 3.8 mEq/L (ref 3.5–5.1)
Sodium: 143 mEq/L (ref 135–145)

## 2013-01-10 LAB — CBC
Hemoglobin: 15.1 g/dL (ref 13.0–17.0)
MCH: 26.8 pg (ref 26.0–34.0)
Platelets: 255 10*3/uL (ref 150–400)
RBC: 5.63 MIL/uL (ref 4.22–5.81)
WBC: 10.7 10*3/uL — ABNORMAL HIGH (ref 4.0–10.5)

## 2013-01-10 LAB — GLUCOSE, CAPILLARY: Glucose-Capillary: 173 mg/dL — ABNORMAL HIGH (ref 70–99)

## 2013-01-10 MED ORDER — SPIRONOLACTONE 50 MG PO TABS: 50.0000 mg | ORAL_TABLET | Freq: Every day | ORAL | Status: DC

## 2013-01-10 MED ORDER — APIXABAN 5 MG PO TABS: 5.0000 mg | ORAL_TABLET | Freq: Two times a day (BID) | ORAL | Status: DC

## 2013-01-10 MED ORDER — ATORVASTATIN CALCIUM 20 MG PO TABS: 20.0000 mg | ORAL_TABLET | Freq: Every evening | ORAL | Status: DC

## 2013-01-10 MED ORDER — DILTIAZEM HCL ER COATED BEADS 180 MG PO CP24: 180.0000 mg | ORAL_CAPSULE | Freq: Every day | ORAL | Status: DC

## 2013-01-10 MED ORDER — FUROSEMIDE 80 MG PO TABS: 80.0000 mg | ORAL_TABLET | Freq: Two times a day (BID) | ORAL | Status: DC

## 2013-01-10 MED ORDER — DIPHENHYDRAMINE HCL 25 MG PO CAPS: 25.0000 mg | ORAL_CAPSULE | ORAL | Status: DC | PRN

## 2013-01-10 NOTE — Care Management Note (Signed)
    Page 1 of 1   01/10/2013     1:29:06 PM   CARE MANAGEMENT NOTE 01/10/2013  Patient:  [name] [name], [name] [name]   Account Number:  [id number]  Date Initiated:  01/10/2013  Documentation initiated by:  Raynetta Osterloh  Subjective/Objective Assessment:   PT ADM WITH CP, SOB, CHF EXACERBATION.  PTA, PT INDEPENDENT, LIVES WITH SPOUSE.  HE HAS HOME BIPAP AND HOME O2 THROUGH [facility].     Action/Plan:   PT TO DC ON ELIQUIS.  FREE 30 DAY TRIAL CARD GIVEN TO PT AND CONFIRMED THAT PT'S PHARMACY HAS MED IN STOCK. ([facility] IN [place]).   Anticipated DC Date:  01/10/2013   Anticipated DC Plan:  HOME/SELF CARE      DC Planning Services  CM consult  Medication Assistance      Choice offered to / List presented to:          [facility] arranged  HH - 11 Patient Refused      Status of service:  Completed, signed off Medicare Important Message given?   (If response is "NO", the following Medicare IM given date fields will be blank) Date Medicare IM given:   Date Additional Medicare IM given:    Discharge Disposition:  HOME/SELF CARE  Per UR Regulation:  Reviewed for med. necessity/level of care/duration of stay  If discussed at Long Length of Stay Meetings, dates discussed:    Comments:  01/10/13 Kyomi Hector,RN,BSN [phone] FEEL PT WOULD BENEFIT FROM HHRN FOR CHF FOLLOW UP AT DC. DISCUSSED WITH PT AND FAMILY:  PT POLITELY DECLINED, STATES, "MY FAMILY WILL HELP ME AT HOME, AND MY NEIGHBOR IS A NURSE."

## 2013-01-10 NOTE — Discharge Summary (Signed)
See progress notes [name] [name]  

## 2013-01-10 NOTE — Discharge Summary (Signed)
Discharge Summary   Patient ID: [name] [name] MRN: [id number], DOB/AGE: 1946-06-05 67 y.o. Admit date: 01/05/2013 D/C date:     01/10/2013  Primary Cardiologist: Jens Som  Primary Discharge Diagnoses:  1. Acute right heart failure with normal LV function  - adm wt 304, dc wt 279lb 2. Obstructive sleep apnea/obesity hypoventilation syndrome, on home O2 3. Morbid obesity BMI 42.4 4. Permanent atrial fibrillation  - changed to apixaban this admission 5. Pulmonary hypertension - multifactorial including obstructive sleep apnea, obesity hypoventilation syndrome and possible pulmonary venous hypertension 6. Abdominal rash - ?related to IV heparin, improving  Secondary Discharge Diagnoses:  1. HLD 2. Gout 3. OHS 4. Unspecified hearing loss 5. HTN 6. Chronic bronchitis 7. PROSTATE SPECIFIC ANTIGEN, ELEVATED 8. Complication of anesthesia - "aspiration pneumonia after hand OR" 9. Diabetes mellitus 10. Common migraine - "none since treating for high BP" 11. Arthritis  Hospital Course: [name] [name] is a 67 y/o gentleman who has a history of hypertension, hyperlipidemia, diabetes and permanent atrial fibrillation who was admitted to the office 01/05/13 with acute right heart failure. He has undergone attempted cardioversion previously, but he did not hold sinus rhythm. His most recent Myoview on March 08, 2008 showed an ejection fraction of 54% and normal perfusion. A previous holter monitor in February of 2012 showed atrial fibrillation with a mildly decreased rate thus cardizem was decreased. Echo in May 2014 showed normal LV function, moderate to severe LAE, mild RAE/RVE and reduced RV function. Since his last OV, he had developed worsening DOE and pedal edema. Spironolactone added to his medical regimen by primary care but symptoms did not improve. He also developed abdominal wall edema. He denied any exertional chest pain, palpitations or syncope. Symptoms were felt consistent with acute right heart  failure in the setting of significant pulmonary hypertension which is multifactorial including obstructive sleep apnea, obesity hypoventilation syndrome and possible pulmonary venous hypertension. He was admitted for IV diuresis and placed on Lasix 120mg  IV BID. Coumadin was held with plan for heparin when INR <2 in prep for RHC. Diltiazem dose was was slightly reduced due to soft BP the morning after admission. He underwent RHC showing elevated R heart pressures as below: RA: 15  RV: 44/11  PCWP: 21  PA: 41/30  Cardiac Output  Thermodilution: 4.27 Index = 1.89  Fick : 4.49 Index 1.8  Arterial Sat: 95 % (by pulse oxymetry)  PA Sat: 61% . Post-procedurally he was noted to have an itchy, non-raised small papular rash on his abdomen. The nurse clarified that the patient had not had the rash prior to the administration of the IV heparin, thus this was discontinued. It wasn't clear that the heparin was the true culprit for the rash since it was only localized to abdomen, and persisted beyond discontinuation of heparin. He was treated with IV benadryl and IV pepcid with improvement. Coumadin was discontinued and he was started on apixaban 5mg  po BID. He has diuresed from 306 lb to 279 lb and -11L with significant improvement in symptoms. Dr. Jens Som has seen and examined him today and feels he is stable for discharge.  Given new initiation into apixaban, we will have him [facility] in 4 weeks at which time he will have a repeat CBC/BMET. Zocor was changed to Lipitor this admission given that he is on Diltiazem and dose is restricted with this combination. The patient received a 30-day paper copy for his new prescriptions and then they were electronically  prescribed to the mail-order system he uses. (The only exception to the e-prescribing is spironolactone, which had already been sent in by PCP.)    Discharge Vitals: Blood pressure 120/78, pulse 86, temperature 98.1 F (36.7 C),  temperature source Axillary, resp. rate 18, height 5\' 8"  (1.727 m), weight 279 lb 1.6 oz (126.6 kg), SpO2 93.00%.  Labs: Lab Results  Component Value Date   WBC 10.7* 01/10/2013   HGB 15.1 01/10/2013   HCT 47.9 01/10/2013   MCV 85.1 01/10/2013   PLT 255 01/10/2013    Recent Labs Lab 01/05/13 2021  01/10/13 0430  NA 140  < > 143  K 3.8  < > 3.8  CL 99  < > 98  CO2 34*  < > 37*  BUN 13  < > 20  CREATININE 0.92  < > 0.90  CALCIUM 8.9  < > 9.6  PROT 7.0  --   --   BILITOT 0.9  --   --   ALKPHOS 66  --   --   ALT 12  --   --   AST 15  --   --   GLUCOSE 142*  < > 110*  < > = values in this interval not displayed.  Lab Results  Component Value Date   CHOL 91 12/20/2012   HDL 27.80* 12/20/2012   LDLCALC 46 12/20/2012   TRIG 85.0 12/20/2012    Diagnostic Studies/Procedures   Dg Chest 2 View  01/06/2013   *RADIOLOGY REPORT*  Clinical Data: Shortness of breath  CHEST - 2 VIEW  Comparison: 12/20/2012  Findings: Cardiomegaly with pulmonary vascular congestion and suspected mild interstitial edema.  Mild patchy lingular and right basilar opacities, atelectasis versus pneumonia.  Suspected small left pleural effusion.  IMPRESSION: Cardiomegaly with suspected mild interstitial edema and small left pleural effusion.  Mild lingular and right basilar opacities, atelectasis versus pneumonia.   Original Report Authenticated By: Charline Bills, M.D.   Dg Chest 2 View 12/20/2012   *RADIOLOGY REPORT*  Clinical Data: Dyspnea on exertion.  Hypoxemia.  CHEST - 2 VIEW  Comparison: 07/11/2008  Findings: There is a moderate cardiac enlargement.  Pulmonary vascular congestion is noted. No overt edema or pleural effusion.  There is no airspace consolidation noted.  Spondylosis noted within the thoracic spine.  IMPRESSION:  1. Cardiac enlargement and pulmonary venous congestion.   Original Report Authenticated By: Signa Kell, M.D.    Discharge Medications     Medication List    STOP taking these medications         diltiazem 300 MG 24 hr capsule  Commonly known as:  TIAZAC     simvastatin 40 MG tablet  Commonly known as:  ZOCOR  Replaced by:  atorvastatin 20 MG tablet     warfarin 5 MG tablet  Commonly known as:  COUMADIN      TAKE these medications       albuterol 108 (90 BASE) MCG/ACT inhaler  Commonly known as:  PROVENTIL HFA;VENTOLIN HFA  Inhale 2 puffs into the lungs every 6 (six) hours as needed for wheezing.     allopurinol 100 MG tablet  Commonly known as:  ZYLOPRIM  Take 100 mg by mouth every morning.     apixaban 5 MG Tabs tablet  Commonly known as:  ELIQUIS  Take 1 tablet (5 mg total) by mouth 2 (two) times daily.     atorvastatin 20 MG tablet  Commonly known as:  LIPITOR  Take 1 tablet (20 mg  total) by mouth every evening.     diltiazem 180 MG 24 hr capsule  Commonly known as:  CARDIZEM CD  Take 1 capsule (180 mg total) by mouth daily.     diphenhydrAMINE 25 mg capsule  Commonly known as:  BENADRYL  Take 1 capsule (25 mg total) by mouth every 4 (four) hours as needed for itching (or rash - available over the counter).     furosemide 80 MG tablet  Commonly known as:  LASIX  Take 1 tablet (80 mg total) by mouth 2 (two) times daily.     glimepiride 1 MG tablet  Commonly known as:  AMARYL  Take 0.5-1 mg by mouth 2 (two) times daily. 0.5 tab in the evening and 1 tab in the a.m.     lisinopril 40 MG tablet  Commonly known as:  PRINIVIL,ZESTRIL  Take 40 mg by mouth every morning.     metoprolol 50 MG tablet  Commonly known as:  LOPRESSOR  Take 50 mg by mouth 2 (two) times daily.     potassium chloride SA 20 MEQ tablet  Commonly known as:  K-DUR,KLOR-CON  Take 20 mEq by mouth daily.     spironolactone 50 MG tablet  Commonly known as:  ALDACTONE  Take 1 tablet (50 mg total) by mouth daily.        Disposition   The patient will be discharged in stable condition to home. Discharge Orders   Future Appointments Provider Department Dept Phone    01/17/2013 7:30 AM [facility]) [phone]   01/24/2013 11:50 AM Beatrice Lecher, PA-C [facility]) [phone]   02/06/2013 9:00 AM [facility] [phone]   02/16/2013 1:00 PM [facility] [phone]   02/16/2013 2:15 PM Coralyn Helling, MD [facility] [phone]   04/05/2013 10:30 AM Corwin Levins, MD [facility] [phone]   Future Orders Complete By Expires     Diet - low sodium heart healthy  As directed     Discharge instructions  As directed     Comments:      You were given printed prescriptions for 30-day supply of your new medicines. A separate prescription for these new medicines was sent in to your mail-order pharmacy. Zocor was changed to Lipitor because this is generally a better medicine to use if you are taking diltiazem.  For patients with congestive heart failure, we give them these special instructions:  1. Follow a low-salt diet and watch your fluid intake. In general, you should not be taking in more than 2 liters of fluid per day (no more than 8 glasses per day). Some patients are restricted to less than 1.5 liters of fluid per day (no more than 6 glasses per day). This includes sources of water in foods like soup, coffee, tea, milk, etc. 2. Weigh yourself on the same scale at same time of day and keep a log. 3. Call your doctor: (Anytime you feel any of the following symptoms)  - 3-4 pound weight gain in 1-2 days or 2 pounds overnight  - Shortness of breath, with or without a dry hacking cough  - Swelling in the hands, feet or stomach  - If you have to sleep on extra pillows at night in order to breathe   IT IS IMPORTANT TO LET YOUR DOCTOR KNOW EARLY ON IF YOU ARE HAVING SYMPTOMS SO WE CAN HELP YOU!  Increase activity slowly  As directed       Follow-up Information   Follow up with  [facility]. (Labwork only on 01/17/13 - can come in anytime between 7:30am and 4:45pm)    Contact information:   [address] [place] [address] [phone]       Follow up with Tereso Newcomer, PA-C. (01/24/13 at 11:50am)    Contact information:   [address] [place] [address] [phone] [facility] [place]      Follow up with Selena Batten. (02/06/13 at 9am - you will have recheck of bloodwork and an education visit in [facility] since you were started on Apixaban)    Contact information:   [address] [place] [address] [phone]        Duration of Discharge Encounter: Greater than 30 minutes including physician and PA time.  Signed, Ronie Spies PA-C 01/10/2013, 11:41 AM

## 2013-01-10 NOTE — Telephone Encounter (Signed)
New problem  Pt having TCM with Scott on 01/24/13 @ 11:50

## 2013-01-10 NOTE — Progress Notes (Signed)
Subjective:  Dyspnea improved; no chest pain. Rash over abdomen improving   Objective:  Vital Signs in the last 24 hours: BP 104/72  Pulse 78  Temp(Src) 98.1 F (36.7 C) (Axillary)  Resp 18  Ht 5\' 8"  (1.727 m)  Wt 279 lb 1.6 oz (126.6 kg)  BMI 42.45 kg/m2  SpO2 93%  Physical Exam: Obese white male without acute distress Neck: supple Lungs: CTA Cardiac: Irregular rhythm, normal S1 and S2, no S3 Abdomen:  Very large; right groin with no hematoma Extremities:  1+ edema with changes of chronic venous insufficiency Skin: rash over lower abd improving  Intake/Output from previous day: 06/03 0701 - 06/04 0700 In: 483 [P.O.:480; I.V.:3] Out: 1700 [Urine:1700] Weight Filed Weights   01/08/13 0604 01/09/13 0430 01/10/13 0407  Weight: 285 lb 4.8 oz (129.411 kg) 278 lb (126.1 kg) 279 lb 1.6 oz (126.6 kg)    Lab Results: Basic Metabolic Panel:  Recent Labs  47/82/95 0440 01/10/13 0430  NA 141 143  K 3.8 3.8  CL 97 98  CO2 38* 37*  GLUCOSE 92 110*  BUN 17 20  CREATININE 0.82 0.90    CBC:  Recent Labs  01/09/13 0440 01/10/13 0430  WBC 10.3 10.7*  HGB 14.7 15.1  HCT 47.1 47.9  MCV 85.3 85.1  PLT 241 255    BNP    Component Value Date/Time   PROBNP 1119.0* 01/05/2013 2031    Telemetry: Atrial fibrillation with controlled ventricular response  Assessment/Plan:  1. Acute right heart failure with normal LV function 2. Obstructive sleep apnea 3. Morbid obesity 4. Permanent atrial fibrillation 5. Long-term anticoagulation 6. Pulmonary hypertension  Recommendations:  Plan DC today on lasix 80 mg po BID and spironolactone 50 mg po daily. Check BMET one week. Continue present meds for rate control of atrial fibrillation. ? Continue apixaban 5 mg po BID. Check Hgb and renal function in 4 weeks. FU with me in 2-4 weeks > 30 min PA and physician time D2  Olga Millers MD [facility]  01/10/2013, 6:49 AM

## 2013-01-10 NOTE — Telephone Encounter (Signed)
Pt currently in hospital. Discharge planned for later today. Will need TCM call on 01/11/13

## 2013-01-11 ENCOUNTER — Telehealth: Payer: Self-pay | Admitting: Family Medicine

## 2013-01-11 NOTE — Telephone Encounter (Signed)
Follow-up: ° ° ° °Patient called in returning your call.  Please call back. °

## 2013-01-11 NOTE — Telephone Encounter (Signed)
Patient contacted regarding discharge from [facility] on 6/4.  Patient understands to follow up with provider Tereso Newcomer, PA on 6/18 at 11:50 at [facility]. Patient understands discharge instructions? Yes Patient understands medications and regiment? Yes Patient understands to bring all medications to this visit? Yes  Patient's wife states patient is doing great; denies problems or concerns.

## 2013-01-11 NOTE — Telephone Encounter (Signed)
LMTCB

## 2013-01-11 NOTE — Telephone Encounter (Signed)
Attempted Transitional Care Call.  Pt did not answer, will try again.

## 2013-01-17 ENCOUNTER — Other Ambulatory Visit (INDEPENDENT_AMBULATORY_CARE_PROVIDER_SITE_OTHER): Payer: Medicare Other

## 2013-01-17 DIAGNOSIS — I509 Heart failure, unspecified: Secondary | ICD-10-CM

## 2013-01-17 DIAGNOSIS — I50810 Right heart failure, unspecified: Secondary | ICD-10-CM

## 2013-01-17 LAB — BASIC METABOLIC PANEL
CO2: 30 mEq/L (ref 19–32)
Calcium: 9.6 mg/dL (ref 8.4–10.5)
Chloride: 101 mEq/L (ref 96–112)
Glucose, Bld: 93 mg/dL (ref 70–99)
Potassium: 4.8 mEq/L (ref 3.5–5.1)
Sodium: 138 mEq/L (ref 135–145)

## 2013-01-24 ENCOUNTER — Ambulatory Visit (INDEPENDENT_AMBULATORY_CARE_PROVIDER_SITE_OTHER): Payer: Medicare Other | Admitting: Physician Assistant

## 2013-01-24 ENCOUNTER — Encounter: Payer: Self-pay | Admitting: Physician Assistant

## 2013-01-24 VITALS — BP 119/81 | HR 77 | Ht 68.0 in | Wt 278.8 lb

## 2013-01-24 DIAGNOSIS — N179 Acute kidney failure, unspecified: Secondary | ICD-10-CM

## 2013-01-24 DIAGNOSIS — I50810 Right heart failure, unspecified: Secondary | ICD-10-CM

## 2013-01-24 DIAGNOSIS — I10 Essential (primary) hypertension: Secondary | ICD-10-CM | POA: Diagnosis not present

## 2013-01-24 DIAGNOSIS — I4891 Unspecified atrial fibrillation: Secondary | ICD-10-CM

## 2013-01-24 DIAGNOSIS — I509 Heart failure, unspecified: Secondary | ICD-10-CM

## 2013-01-24 LAB — BASIC METABOLIC PANEL
BUN: 59 mg/dL — ABNORMAL HIGH (ref 6–23)
CO2: 29 mEq/L (ref 19–32)
Chloride: 94 mEq/L — ABNORMAL LOW (ref 96–112)
Creatinine, Ser: 1.7 mg/dL — ABNORMAL HIGH (ref 0.4–1.5)
Glucose, Bld: 76 mg/dL (ref 70–99)
Potassium: 4.6 mEq/L (ref 3.5–5.1)

## 2013-01-24 NOTE — Progress Notes (Signed)
[address] [place], [place]  [address] Phone: [phone] Fax:  [phone]  Date:  01/24/2013   ID:  ISEAH PLOUFF, DOB September 30, 1945, MRN [id number]  PCP:  Oliver Barre, MD  Cardiologist:  Dr. Olga Millers     History of Present Illness: [name] [name] is a 67 y.o. male who returns for f/u after a recent admission to the hospital for acute R sided HF.  He has a hx of HTN, HL, DM2, permanent AFib, COPD.  He has failed DCCV in the past.  Myoview 7/09: EF 54%, normal perfusion.  Holter 09/2010: AFib with mildly decreased rate.  Echo 5/14: normal LVF, mod to severe LAE, mild RAE, mild RVE, mild reduced RVSF.    He was admitted from the office by Dr. Jens Som 5/30-6/4 for volume overload.  Volume overload was felt to be consistent with acute right heart failure in the setting of significant pulmonary hypertension which is multifactorial including OSA, OHS and possible pulmonary venous hypertension. He was treated with IV Lasix. RHC 01/08/13 demonstrated elevated R heart pressures (RA 15, RV 44/11, PCWP 21, PA 41/30, CO 4.49, CI 1.8, PA sat 61%).  Patient developed a rash in the hospital. IV heparin was discontinued. It was not clear that his rash was related to heparin. Dr. Jens Som transitioned him from Coumadin to Apixaban 5 mg twice a day. Patient diuresed 11 L. His weight was down 27 pounds at discharge (279 LB).  Echo 5/14:  Mild LVH, EF 55%, mod to severe LAE, mild RVE, mild reduced RVSF, mild RAE.  Since d/c, he is doing well.  Breathing is improved.  He is probably NYHA Class IIb.  No significant CP.  No syncope.  He sleeps in a recliner due to back pain.  No PND.  LE edema much improved.  His weights are stable.  Of note, labs after d/c indicated AKI and he was to decrease Lasix and hold K+.  He was never contacted with these instructions and is still taking the same medications.   Labs (5/14):  K 3.8, Cr 0.92, ALT 12, proBNP 1119, TSH 2.571 Labs (6/14):  K 3.8=>4.8, Cr 0.9=>1.7,  Hgb 15.1  Wt Readings from Last 3 Encounters:  01/24/13 278 lb 12.8 oz (126.463 kg)  01/10/13 279 lb 1.6 oz (126.6 kg)  01/10/13 279 lb 1.6 oz (126.6 kg)     Past Medical History  Diagnosis Date  . HYPERLIPIDEMIA   . GOUT   . Morbid obesity   . OBESITY HYPOVENTILATION SYNDROME     a. on home O2.  Marland Kitchen Unspecified hearing loss   . HYPERTENSION   . BRONCHITIS, CHRONIC 01/02/2009  . SKIN RASH 03/12/2010  . PROSTATE SPECIFIC ANTIGEN, ELEVATED 06/09/2007  . Long term (current) use of anticoagulants   . Complication of anesthesia     "aspiration pneumonia after hand OR" (01/05/2013)  . Permanent atrial fibrillation   . OSA (obstructive sleep apnea)   . DIABETES MELLITUS, TYPE II   . COMMON MIGRAINE     "none since treating for high BP"  . Arthritis     "knees and hands; thoracic area of the spine" (01/05/2013)  . On home oxygen therapy   . Acute right-sided CHF (congestive heart failure)     a. 01/2013.  . Pulmonary HTN     a. multifactorial including obstructive sleep apnea, obesity hypoventilation syndrome and possible pulmonary venous hypertension.    Current Outpatient Prescriptions  Medication Sig Dispense Refill  .  albuterol (PROVENTIL HFA;VENTOLIN HFA) 108 (90 BASE) MCG/ACT inhaler Inhale 2 puffs into the lungs every 6 (six) hours as needed for wheezing.  1 Inhaler  11  . allopurinol (ZYLOPRIM) 100 MG tablet Take 100 mg by mouth every morning.      Marland Kitchen apixaban (ELIQUIS) 5 MG TABS tablet Take 1 tablet (5 mg total) by mouth 2 (two) times daily.  180 tablet  1  . atorvastatin (LIPITOR) 20 MG tablet Take 1 tablet (20 mg total) by mouth every evening.  90 tablet  1  . diltiazem (CARDIZEM CD) 180 MG 24 hr capsule Take 1 capsule (180 mg total) by mouth daily.  90 capsule  1  . furosemide (LASIX) 80 MG tablet Take 1 tablet (80 mg total) by mouth 2 (two) times daily.  180 tablet  1  . glimepiride (AMARYL) 1 MG tablet Take 0.5-1 mg by mouth 2 (two) times daily. 0.5 tab in the evening and 1  tab in the a.m.      Marland Kitchen lisinopril (PRINIVIL,ZESTRIL) 40 MG tablet Take 40 mg by mouth every morning.      . metoprolol (LOPRESSOR) 50 MG tablet Take 50 mg by mouth 2 (two) times daily.      . potassium chloride SA (K-DUR,KLOR-CON) 20 MEQ tablet Take 20 mEq by mouth daily.      Marland Kitchen spironolactone (ALDACTONE) 50 MG tablet Take 1 tablet (50 mg total) by mouth daily.  30 tablet  0   No current facility-administered medications for this visit.    Allergies:    Allergies  Allergen Reactions  . Heparin     ? Abdominal rash 01/2013    Social History:  The patient  reports that he has quit smoking. His smoking use included Cigarettes. He has a 15 pack-year smoking history. He has never used smokeless tobacco. He reports that  drinks alcohol. He reports that he does not use illicit drugs.   ROS:  Please see the history of present illness.   He notes occ hemorrhoidal bleeding.  No changes.  He has a minimal nonproductive cough.  All other systems reviewed and negative.   PHYSICAL EXAM: VS:  BP 119/81  Pulse 77  Ht 5\' 8"  (1.727 m)  Wt 278 lb 12.8 oz (126.463 kg)  BMI 42.4 kg/m2  SpO2 97% Well nourished, well developed, in no acute distress HEENT: normal Neck: no JVD Cardiac:  normal S1, S2; irreg irreg rhythm; no murmur Lungs:  clear to auscultation bilaterally, no wheezing, rhonchi or rales Abd: soft, nontender, no hepatomegaly Ext: trace brawny bilateral LE edema Skin: warm and dry Neuro:  CNs 2-12 intact, no focal abnormalities noted  EKG:  AFib, HR 77     ASSESSMENT AND PLAN:  1. R Sided CHF:  Volume appears stable.  No change in current Rx.  See below. 2. Acute Kidney Injury:  Patient never contacted regarding change in Lasix.  Check BMET today and make adjustments based upon these results. 3. Atrial Fibrillation:  Rate controlled.  Remain on Eliquis.  He has f/u with the CVRR clinic.   4. Hypertension:  Controlled.  Continue current therapy.  5. Hyperlipidemia:  Continue  statin. 6. OSA/OHS:  Continue f/u with pulmonary. 7. Disposition:  F/u with me in 1 month and Dr. Olga Millers in 3 mos.  Signed, [name] Newcomer, PA-C  01/24/2013 12:28 PM

## 2013-01-24 NOTE — Patient Instructions (Addendum)
LAB TODAY; BMET   PLEASE FOLLOW UP WITH [place], [facility] 02/28/13 @ 8:30 am  PLEASE FOLLOW UP WITH DR. CRENSHAW IN 3 MONTHS 05/01/13 @ 8:45

## 2013-01-26 ENCOUNTER — Telehealth: Payer: Self-pay | Admitting: *Deleted

## 2013-01-26 DIAGNOSIS — I509 Heart failure, unspecified: Secondary | ICD-10-CM

## 2013-01-26 MED ORDER — FUROSEMIDE 80 MG PO TABS: 40.0000 mg | ORAL_TABLET | Freq: Two times a day (BID) | ORAL | Status: DC

## 2013-01-26 NOTE — Telephone Encounter (Signed)
s/w [name] pt's daughter, states pt hard of hearing. [name] has been notified about lab results, decrease lasix 40 BID, d/c K+, bmet 7/1 w/CVRR appt, weigh daily, take xtra 40 mg lasix if wt up 2-3 lb x 1 day and call us, verbalized understanding

## 2013-01-26 NOTE — Telephone Encounter (Signed)
Message copied by Tarri Fuller on Fri Jan 26, 2013 10:25 AM ------      Message from: [place], [place] T      Created: Wed Jan 24, 2013  9:48 PM       BUN and creatinine increased      Decrease Lasix to 40 mg bid      D/c K+      Repeat BMET in 1 week      Weigh daily - take extra Lasix 40 mg po x 1 if weight increases 2-3 lbs in 1 day and call      Tereso Newcomer, PA-C        01/24/2013 9:48 PM ------

## 2013-01-29 ENCOUNTER — Other Ambulatory Visit: Payer: Self-pay | Admitting: *Deleted

## 2013-01-29 DIAGNOSIS — N289 Disorder of kidney and ureter, unspecified: Secondary | ICD-10-CM

## 2013-01-30 ENCOUNTER — Telehealth: Payer: Self-pay | Admitting: Pulmonary Disease

## 2013-01-30 DIAGNOSIS — E662 Morbid (severe) obesity with alveolar hypoventilation: Secondary | ICD-10-CM

## 2013-01-30 DIAGNOSIS — G4733 Obstructive sleep apnea (adult) (pediatric): Secondary | ICD-10-CM

## 2013-01-30 NOTE — Telephone Encounter (Signed)
ONO with BiPAP and 4 liters 01/18/13 >> Test time 8 hrs 55 min.  Basal SpO2 91.7%, low SpO2 83%.  Spent 32 min with SpO2 < 88%.  BiPAP 12/07/12 to 01/17/13 >> Used on 38 of 42 nights with average 11 hours.  Average AHI 25.3 with BiPAP 18/9 cm H2O.  Will have my nurse inform pt that oxygen test looks okay, but he is still having issues with his sleep apnea.  Will adjust his BiPAP pressure settings.  Will repeat download, and call with results.  No change to supplemental oxygen set up.

## 2013-01-30 NOTE — Telephone Encounter (Signed)
Pt's wife is aware of download and ONO results.

## 2013-02-06 ENCOUNTER — Ambulatory Visit (INDEPENDENT_AMBULATORY_CARE_PROVIDER_SITE_OTHER): Payer: Medicare Other | Admitting: *Deleted

## 2013-02-06 ENCOUNTER — Other Ambulatory Visit: Payer: Medicare Other

## 2013-02-06 ENCOUNTER — Other Ambulatory Visit: Payer: Self-pay | Admitting: *Deleted

## 2013-02-06 DIAGNOSIS — I4891 Unspecified atrial fibrillation: Secondary | ICD-10-CM

## 2013-02-06 DIAGNOSIS — I509 Heart failure, unspecified: Secondary | ICD-10-CM | POA: Diagnosis not present

## 2013-02-06 DIAGNOSIS — Z7901 Long term (current) use of anticoagulants: Secondary | ICD-10-CM | POA: Diagnosis not present

## 2013-02-06 LAB — BASIC METABOLIC PANEL
CO2: 32 mEq/L (ref 19–32)
Calcium: 9.4 mg/dL (ref 8.4–10.5)
Creatinine, Ser: 1.2 mg/dL (ref 0.4–1.5)
GFR: 61.77 mL/min (ref >60.00)
Sodium: 136 mEq/L (ref 135–145)

## 2013-02-06 LAB — CBC
HCT: 44.9 % (ref 39.0–52.0)
Hemoglobin: 14.7 g/dL (ref 13.0–17.0)
MCHC: 32.8 g/dL (ref 30.0–36.0)
MCV: 83.5 fl (ref 78.0–100.0)
RDW: 19 % — ABNORMAL HIGH (ref 11.5–14.6)
WBC: 12.2 10*3/uL — ABNORMAL HIGH (ref 4.5–10.5)

## 2013-02-06 MED ORDER — DILTIAZEM HCL ER COATED BEADS 180 MG PO CP24: 180.0000 mg | ORAL_CAPSULE | Freq: Every day | ORAL | Status: DC

## 2013-02-06 MED ORDER — ATORVASTATIN CALCIUM 20 MG PO TABS: 20.0000 mg | ORAL_TABLET | Freq: Every evening | ORAL | Status: DC

## 2013-02-06 NOTE — Progress Notes (Signed)
Pt was started on Eliquis 5mg  bid  for  Atrial Fib on January 10 2013.    Reviewed patients medication list.  Pt is not currently on any combined P-gp and strong CYP3A4 inhibitors/inducers (ketoconazole, traconazole, ritonavir, carbamazepine, phenytoin, rifampin, St. John's wort).  Reviewed labs.  SCr 1.2 , Weight 127.45kg,  .  Dose is appropriate  based on labs weight and age..   Hgb 14.7 and HCT 44.9  A full discussion of the nature of anticoagulants has been carried out.  A benefit/risk analysis has been presented to the patient, so that they understand the justification for choosing anticoagulation with Eliquis at this time.  The need for compliance is stressed.  Pt is aware to take the medication twice daily.  Side effects of potential bleeding are discussed, including unusual colored urine or stools, coughing up blood or coffee ground emesis, nose bleeds or serious fall or head trauma.  Discussed signs and symptoms of stroke. The patient should avoid any OTC items containing aspirin or ibuprofen.  Avoid alcohol consumption.   Call if any signs of abnormal bleeding.  Discussed financial obligations no financial problem  in obtaining medication.  Next lab test test in 6 months.  Pt was in [facility]  01/05/2013 to 01/10/2013  Acute right heart  failure with normal LV  Function and started on Eliquis upon discharge . Right heart Cath  01/08/2013. Pt states he has had loose stools for last 2 weeks instructed to notify MD of this. 02/07/2013 Talked with pts wife and informed that he is on correct dose of Eliquis  Based on SrCr and weight and age and will need to be seen again in 6 months for cbc and bmet and will be called to set up this appt.later.

## 2013-02-07 ENCOUNTER — Telehealth: Payer: Self-pay | Admitting: *Deleted

## 2013-02-07 DIAGNOSIS — I509 Heart failure, unspecified: Secondary | ICD-10-CM

## 2013-02-07 NOTE — Telephone Encounter (Signed)
Message copied by Tarri Fuller on Wed Feb 07, 2013  8:45 AM ------      Message from: [place], [place] T      Created: Tue Feb 06, 2013  4:46 PM       Creatinine improved      Continue current dose of Lasix      K+ borderline elevated (? If this was hemolyzed)      Make sure he is not taking K+ supplement at all      Reduce dietary K+      Repeat BMET in 1 week      Tereso Newcomer, PA-C        02/06/2013 4:46 PM ------

## 2013-02-07 NOTE — Telephone Encounter (Signed)
pt's wife notified about lab results and to decrease dietary K+, wife states pt is not taking any K+ supp. repeat bmet 02/13/13, Jasmine December in [facility] also s/w pt's wife about the Xarelto

## 2013-02-08 DIAGNOSIS — I10 Essential (primary) hypertension: Secondary | ICD-10-CM | POA: Diagnosis not present

## 2013-02-08 DIAGNOSIS — N179 Acute kidney failure, unspecified: Secondary | ICD-10-CM | POA: Diagnosis not present

## 2013-02-08 DIAGNOSIS — I509 Heart failure, unspecified: Secondary | ICD-10-CM | POA: Diagnosis not present

## 2013-02-08 DIAGNOSIS — I4891 Unspecified atrial fibrillation: Secondary | ICD-10-CM | POA: Diagnosis not present

## 2013-02-13 ENCOUNTER — Other Ambulatory Visit (INDEPENDENT_AMBULATORY_CARE_PROVIDER_SITE_OTHER): Payer: Medicare Other

## 2013-02-13 ENCOUNTER — Telehealth: Payer: Self-pay | Admitting: *Deleted

## 2013-02-13 DIAGNOSIS — I509 Heart failure, unspecified: Secondary | ICD-10-CM | POA: Diagnosis not present

## 2013-02-13 LAB — BASIC METABOLIC PANEL
BUN: 26 mg/dL — ABNORMAL HIGH (ref 6–23)
Chloride: 104 mEq/L (ref 96–112)
GFR: 69.47 mL/min (ref >60.00)
Potassium: 5 mEq/L (ref 3.5–5.1)

## 2013-02-13 NOTE — Telephone Encounter (Signed)
s/w pt's wife due to pt hard of hearing. Wife has been notified about lab results with verbal understanding

## 2013-02-13 NOTE — Telephone Encounter (Signed)
Message copied by Tarri Fuller on Tue Feb 13, 2013  5:53 PM ------      Message from: [place], [place] T      Created: Tue Feb 13, 2013  5:38 PM       K+ and creatinine on labs today (02/13/2013) ok      Continue with current treatment plan.      Tereso Newcomer, PA-C        02/13/2013 5:38 PM ------

## 2013-02-16 ENCOUNTER — Ambulatory Visit (INDEPENDENT_AMBULATORY_CARE_PROVIDER_SITE_OTHER): Payer: Medicare Other | Admitting: Pulmonary Disease

## 2013-02-16 ENCOUNTER — Encounter: Payer: Self-pay | Admitting: Pulmonary Disease

## 2013-02-16 VITALS — BP 110/70 | HR 76 | Temp 98.4°F | Ht 66.25 in | Wt 280.0 lb

## 2013-02-16 DIAGNOSIS — R0609 Other forms of dyspnea: Secondary | ICD-10-CM | POA: Diagnosis not present

## 2013-02-16 DIAGNOSIS — G4733 Obstructive sleep apnea (adult) (pediatric): Secondary | ICD-10-CM | POA: Diagnosis not present

## 2013-02-16 DIAGNOSIS — E678 Other specified hyperalimentation: Secondary | ICD-10-CM

## 2013-02-16 DIAGNOSIS — J42 Unspecified chronic bronchitis: Secondary | ICD-10-CM

## 2013-02-16 DIAGNOSIS — E662 Morbid (severe) obesity with alveolar hypoventilation: Secondary | ICD-10-CM | POA: Diagnosis not present

## 2013-02-16 LAB — PULMONARY FUNCTION TEST

## 2013-02-16 NOTE — Assessment & Plan Note (Signed)
No evidence for obstruction on PFT.

## 2013-02-16 NOTE — Progress Notes (Signed)
Chief Complaint  Patient presents with  . Sleep Apnea    Currently using CPAP machine every night for at least 10 hours. Feels well rested the next day. Reports that the mask is rubbing his face/nose raw. Denies problem with machine or pressure.    History of Present Illness: [name] [name] is a 67 y.o. male severe OSA, OHS, and chronic bronchitis with history of tobacco abuse.  He has been sleeping better since he had BiPAP changed to auto setting.  He is feeling better since using oxygen 24/7.  He is not having cough or wheeze.  His leg swelling is better.  His weight is down.  He had PFT's earlier today >> this showed mild restrictive defect most likely related to his weight.  No evidence for obstruction.   TESTS: PSG 06/18/08 >> AHI 64, BPAP 18/9 with 4 liters oxygen Spirometry 04/03/09 >> FEV1 2.08(69%), FEV1% 77 ONO with BiPAP and 4 liters 01/18/13 >> Test time 8 hrs 55 min. Basal SpO2 91.7%, low SpO2 83%. Spent 32 min with SpO2 < 88%. Auto BiPAP 02/01/13 to 02/14/13 >> Used on 14 of 14 nights with average 9 hrs 58 min.  Average AHI 13 (mostly hypopneas) with median IPAP 12 cm H2O and EPAP 7 cm H2O. PFT 02/16/13 >> FEV1 2.06 (76%), FEV1% 85, TLC 1.64 (74%), DLCO 78%, no BD   [name] [name]  has a past medical history of HYPERLIPIDEMIA; GOUT; Morbid obesity; OBESITY HYPOVENTILATION SYNDROME; Unspecified hearing loss; HYPERTENSION; BRONCHITIS, CHRONIC (01/02/2009); SKIN RASH (03/12/2010); PROSTATE SPECIFIC ANTIGEN, ELEVATED (06/09/2007); Long term (current) use of anticoagulants; Complication of anesthesia; Permanent atrial fibrillation; OSA (obstructive sleep apnea); DIABETES MELLITUS, TYPE II; COMMON MIGRAINE; Arthritis; On home oxygen therapy; Acute right-sided CHF (congestive heart failure); and Pulmonary HTN.  [name] [name]  has past surgical history that includes Appendectomy (1974); Tendon repair (Right, 2009); Cardioversion (05/21/2008; ~ 06/2008); Inguinal hernia repair (Right); Hernia repair; and  Umbilical hernia repair.  Prior to Admission medications   Medication Sig Start Date End Date Taking? Authorizing Provider  albuterol (PROVENTIL HFA;VENTOLIN HFA) 108 (90 BASE) MCG/ACT inhaler Inhale 2 puffs into the lungs every 6 (six) hours as needed for wheezing. 12/20/12  Yes Corwin Levins, MD  allopurinol (ZYLOPRIM) 100 MG tablet TAKE 1 TABLET BY MOUTH ONCE A DAY 08/20/12  Yes Corwin Levins, MD  diltiazem (CARDIZEM CD) 300 MG 24 hr capsule TAKE 1 CAPSULE BY MOUTH ONCE DAILY 08/20/12  Yes Corwin Levins, MD  furosemide (LASIX) 80 MG tablet TAKE 1 TABLET BY MOUTH TWICE DAILY 12/20/12  Yes Corwin Levins, MD  glimepiride (AMARYL) 1 MG tablet  08/20/12  Yes Corwin Levins, MD  glucose blood (ONE TOUCH ULTRA TEST) test strip Use one strip two times a day to test blood glucose 12/28/10  Yes Corwin Levins, MD  lisinopril (PRINIVIL,ZESTRIL) 40 MG tablet TAKE 1 TABLET BY MOUTH ONCE DAILY 08/20/12  Yes Corwin Levins, MD  metoprolol (LOPRESSOR) 50 MG tablet TAKE 1 TABLET BY MOUTH 2 TIMES A DAY 07/02/12  Yes Lewayne Bunting, MD  potassium chloride SA (K-DUR,KLOR-CON) 20 MEQ tablet TAKE 1 TABLET BY MOUTH ONCE A DAY 08/20/12  Yes Corwin Levins, MD  simvastatin (ZOCOR) 40 MG tablet TAKE 1 TABLET BY MOUTH ONCE A DAY 08/20/12  Yes Corwin Levins, MD  warfarin (COUMADIN) 5 MG tablet Take as directed by coumadin clinic 09/25/12  Yes Lewayne Bunting, MD    Allergies  Allergen Reactions  .  Heparin     ? Abdominal rash 01/2013     Physical Exam:  General - No distress, wearing oxygen ENT - No sinus tenderness, MP 4, no oral exudate, no LAN Cardiac - s1s2 regular, no murmur Chest - Decreased breath sounds, no wheeze/rales Back - No focal tenderness Abd - Soft, non-tender Ext - 1+ edema b/l Neuro - Normal strength Skin - chronic venous stasis changes Psych - normal mood, and behavior  Assessment/Plan:  Coralyn Helling, MD [facility] Pager:  [phone]

## 2013-02-16 NOTE — Progress Notes (Signed)
PFT done today. 

## 2013-02-16 NOTE — Assessment & Plan Note (Signed)
Continue with 4 liters oxygen 24/7.  Will change his O2 to pulsed oxygen set up.

## 2013-02-16 NOTE — Assessment & Plan Note (Signed)
Most likely related to obesity, deconditioning, and hypoxemia from OHS.  He likely has component of secondary pulmonary hypertension.

## 2013-02-16 NOTE — Patient Instructions (Signed)
Follow up in 6 months 

## 2013-02-16 NOTE — Assessment & Plan Note (Signed)
Doing well with current BiPAP set up.

## 2013-02-28 ENCOUNTER — Encounter: Payer: Self-pay | Admitting: Pulmonary Disease

## 2013-02-28 ENCOUNTER — Ambulatory Visit: Payer: Medicare Other | Admitting: Physician Assistant

## 2013-03-12 ENCOUNTER — Other Ambulatory Visit: Payer: Self-pay

## 2013-03-12 MED ORDER — SPIRONOLACTONE 50 MG PO TABS: 50.0000 mg | ORAL_TABLET | Freq: Every day | ORAL | Status: DC

## 2013-03-14 ENCOUNTER — Other Ambulatory Visit: Payer: Self-pay

## 2013-03-15 ENCOUNTER — Ambulatory Visit: Payer: Medicare Other | Admitting: Physician Assistant

## 2013-04-05 ENCOUNTER — Ambulatory Visit (INDEPENDENT_AMBULATORY_CARE_PROVIDER_SITE_OTHER): Payer: Medicare Other | Admitting: Internal Medicine

## 2013-04-05 ENCOUNTER — Encounter: Payer: Self-pay | Admitting: Physician Assistant

## 2013-04-05 ENCOUNTER — Encounter: Payer: Self-pay | Admitting: Internal Medicine

## 2013-04-05 ENCOUNTER — Ambulatory Visit (INDEPENDENT_AMBULATORY_CARE_PROVIDER_SITE_OTHER): Payer: Medicare Other | Admitting: Physician Assistant

## 2013-04-05 ENCOUNTER — Other Ambulatory Visit (INDEPENDENT_AMBULATORY_CARE_PROVIDER_SITE_OTHER): Payer: Medicare Other

## 2013-04-05 VITALS — BP 110/64 | HR 88 | Temp 99.0°F | Ht 67.0 in | Wt 280.0 lb

## 2013-04-05 VITALS — BP 124/76 | HR 73 | Ht 67.0 in | Wt 279.0 lb

## 2013-04-05 DIAGNOSIS — I509 Heart failure, unspecified: Secondary | ICD-10-CM | POA: Diagnosis not present

## 2013-04-05 DIAGNOSIS — E785 Hyperlipidemia, unspecified: Secondary | ICD-10-CM

## 2013-04-05 DIAGNOSIS — E119 Type 2 diabetes mellitus without complications: Secondary | ICD-10-CM | POA: Diagnosis not present

## 2013-04-05 DIAGNOSIS — I4891 Unspecified atrial fibrillation: Secondary | ICD-10-CM | POA: Diagnosis not present

## 2013-04-05 DIAGNOSIS — Z23 Encounter for immunization: Secondary | ICD-10-CM | POA: Diagnosis not present

## 2013-04-05 DIAGNOSIS — I10 Essential (primary) hypertension: Secondary | ICD-10-CM

## 2013-04-05 DIAGNOSIS — IMO0001 Reserved for inherently not codable concepts without codable children: Secondary | ICD-10-CM

## 2013-04-05 DIAGNOSIS — R609 Edema, unspecified: Secondary | ICD-10-CM

## 2013-04-05 LAB — CBC WITH DIFFERENTIAL/PLATELET
Basophils Absolute: 0 10*3/uL (ref 0.0–0.1)
Eosinophils Absolute: 0.4 10*3/uL (ref 0.0–0.7)
HCT: 41.3 % (ref 39.0–52.0)
Hemoglobin: 13.9 g/dL (ref 13.0–17.0)
Lymphs Abs: 2.5 10*3/uL (ref 0.7–4.0)
MCHC: 33.7 g/dL (ref 30.0–36.0)
MCV: 85.2 fl (ref 78.0–100.0)
Monocytes Absolute: 1 10*3/uL (ref 0.1–1.0)
Neutro Abs: 7.8 10*3/uL — ABNORMAL HIGH (ref 1.4–7.7)
Platelets: 257 10*3/uL (ref 150.0–400.0)
RDW: 21.4 % — ABNORMAL HIGH (ref 11.5–14.6)

## 2013-04-05 MED ORDER — GLIMEPIRIDE 1 MG PO TABS: ORAL_TABLET | ORAL | Status: DC

## 2013-04-05 NOTE — Patient Instructions (Addendum)
You had the flu shot today Please continue all other medications as before, and refills have been done if requested. Please have the pharmacy call with any other refills you may need. Please go to the LAB in the Basement (turn left off the elevator) for the tests to be done today You will be contacted by phone if any changes need to be made immediately.  Otherwise, you will receive a letter about your results with an explanation, but please check with MyChart first. Please keep your appointments with your specialists as you have planned  Please return in 6 months, or sooner if needed

## 2013-04-05 NOTE — Assessment & Plan Note (Signed)
stable overall by history and exam, recent data reviewed with pt, and pt to continue medical treatment as before,  to f/u any worsening symptoms or concerns Lab Results  Component Value Date   LDLCALC 46 12/20/2012

## 2013-04-05 NOTE — Progress Notes (Signed)
Subjective:    Patient ID: [name] [name], male    DOB: [birth year], 67 y.o.   MRN: [id number]  HPI here with wife for f/u, wearing Home o2 more now per pulmonary suggestion; overall doing ok,  Pt denies chest pain, increased sob or doe, wheezing, orthopnea, PND, increased LE swelling, palpitations, dizziness or syncope.  Pt denies polydipsia, polyuria, or low sugar symptoms such as weakness or confusion improved with po intake.  Pt denies new neurological symptoms such as new headache, or facial or extremity weakness or numbness.   Pt states overall good compliance with meds, has been trying to follow lower cholesterol, diabetic diet, with wt overall stable,  but little exercise however. LE edema goes down quite a bit at night, and overall less than usual in the past recently during the day, quite pleased with this.  No acute complaints,  Due for flu shot Past Medical History  Diagnosis Date  . HYPERLIPIDEMIA   . GOUT   . Morbid obesity   . OBESITY HYPOVENTILATION SYNDROME     a. on home O2.  Marland Kitchen Unspecified hearing loss   . HYPERTENSION   . BRONCHITIS, CHRONIC 01/02/2009  . SKIN RASH 03/12/2010  . PROSTATE SPECIFIC ANTIGEN, ELEVATED 06/09/2007  . Long term (current) use of anticoagulants   . Complication of anesthesia     "aspiration pneumonia after hand OR" (01/05/2013)  . Permanent atrial fibrillation   . OSA (obstructive sleep apnea)   . DIABETES MELLITUS, TYPE II   . COMMON MIGRAINE     "none since treating for high BP"  . Arthritis     "knees and hands; thoracic area of the spine" (01/05/2013)  . On home oxygen therapy   . Acute right-sided CHF (congestive heart failure)     a. 01/2013.  . Pulmonary HTN     a. multifactorial including obstructive sleep apnea, obesity hypoventilation syndrome and possible pulmonary venous hypertension.   Past Surgical History  Procedure Laterality Date  . Appendectomy  1974  . Tendon repair Right 2009    "lacerated his tendon and pulley" Dr. Izora Ribas  .  Cardioversion  05/21/2008; ~ 06/2008  . Inguinal hernia repair Right   . Hernia repair    . Umbilical hernia repair      reports that he has quit smoking. His smoking use included Cigarettes. He has a 15 pack-year smoking history. He has never used smokeless tobacco. He reports that  drinks alcohol. He reports that he does not use illicit drugs. family history includes Alzheimer's disease in his father; Cancer in his father and other; Coronary artery disease in his other. Allergies  Allergen Reactions  . Heparin     ? Abdominal rash 01/2013   Current Outpatient Prescriptions on File Prior to Visit  Medication Sig Dispense Refill  . albuterol (PROVENTIL HFA;VENTOLIN HFA) 108 (90 BASE) MCG/ACT inhaler Inhale 2 puffs into the lungs every 6 (six) hours as needed for wheezing.  1 Inhaler  11  . allopurinol (ZYLOPRIM) 100 MG tablet Take 100 mg by mouth every morning.      Marland Kitchen apixaban (ELIQUIS) 5 MG TABS tablet Take 1 tablet (5 mg total) by mouth 2 (two) times daily.  180 tablet  1  . atorvastatin (LIPITOR) 20 MG tablet Take 1 tablet (20 mg total) by mouth every evening.  90 tablet  1  . diltiazem (CARDIZEM CD) 180 MG 24 hr capsule Take 1 capsule (180 mg total) by mouth daily.  90 capsule  1  .  furosemide (LASIX) 80 MG tablet Take 0.5 tablets (40 mg total) by mouth 2 (two) times daily.      Marland Kitchen lisinopril (PRINIVIL,ZESTRIL) 40 MG tablet Take 40 mg by mouth every morning.      . metoprolol (LOPRESSOR) 50 MG tablet Take 50 mg by mouth 2 (two) times daily.      Marland Kitchen spironolactone (ALDACTONE) 50 MG tablet Take 1 tablet (50 mg total) by mouth daily.  90 tablet  3   No current facility-administered medications on file prior to visit.        Review of Systems     Objective:   Physical Exam        Assessment & Plan:

## 2013-04-05 NOTE — Assessment & Plan Note (Signed)
stable overall by history and exam, recent data reviewed with pt, and pt to continue medical treatment as before,  to f/u any worsening symptoms or concerns Lab Results  Component Value Date   WBC 12.2* 02/06/2013   HGB 14.7 02/06/2013   HCT 44.9 02/06/2013   PLT 232.0 02/06/2013   GLUCOSE 84 02/13/2013   CHOL 91 12/20/2012   TRIG 85.0 12/20/2012   HDL 27.80* 12/20/2012   LDLCALC 46 12/20/2012   ALT 12 01/05/2013   AST 15 01/05/2013   NA 141 02/13/2013   K 5.0 02/13/2013   CL 104 02/13/2013   CREATININE 1.1 02/13/2013   BUN 26* 02/13/2013   CO2 34* 02/13/2013   TSH 2.571 01/05/2013   PSA 1.24 07/11/2012   INR 1.25 01/09/2013   HGBA1C 7.3* 12/20/2012   MICROALBUR 27.8* 06/22/2011

## 2013-04-05 NOTE — Assessment & Plan Note (Signed)
stable overall by history and exam, recent data reviewed with pt, and pt to continue medical treatment as before,  to f/u any worsening symptoms or concerns Lab Results  Component Value Date   HGBA1C 7.3* 12/20/2012    

## 2013-04-05 NOTE — Patient Instructions (Signed)
Your physician recommends that you continue on your current medications as directed. Please refer to the Current Medication list given to you today.  Your physician recommends that you schedule a follow-up appointment in: 3 months with Dr.Crenshaw  

## 2013-04-05 NOTE — Assessment & Plan Note (Signed)
stable overall by history and exam, recent data reviewed with pt, and pt to continue medical treatment as before,  to f/u any worsening symptoms or concerns BP Readings from Last 3 Encounters:  04/05/13 110/64  02/16/13 110/70  01/24/13 119/81

## 2013-04-05 NOTE — Progress Notes (Signed)
[address], [address] [place], [place]  [address] Phone: [phone] Fax:  [phone]  Date:  04/05/2013   ID:  [name] [name], DOB 26-May-1946, MRN [id number]  PCP:  [name] Barre, MD  Cardiologist:  Dr. Olga Millers     History of Present Illness: [name] [name] is a 67 y.o. male who returns for f/u.    He has a hx of HTN, HL, DM2, permanent AFib, COPD.  He has failed DCCV in the past.  Myoview 7/09: EF 54%, normal perfusion.  Holter 09/2010: AFib with mildly decreased rate.  Echo 5/14: normal LVF, mod to severe LAE, mild RAE, mild RVE, mild reduced RVSF.    He was admitted 12/2012 with acute right heart failure in the setting of significant pulmonary hypertension which is multifactorial including OSA, OHS and possible pulmonary venous hypertension.  RHC 01/08/13 demonstrated elevated R heart pressures (RA 15, RV 44/11, PCWP 21, PA 41/30, CO 4.49, CI 1.8, PA sat 61%).  He was transitioned him from Coumadin to Apixaban 5 mg twice a day.  Echo 5/14:  Mild LVH, EF 55%, mod to severe LAE, mild RVE, mild reduced RVSF, mild RAE.  I saw him in followup 01/24/13.  Labs indicated he was over diuresed and his Lasix was adjusted.  Overall doing well.  Breathing is improved.  No CP.  No syncope.  LE edema is stable.  Weights at home stable.  He wants to start water exercises at the [facility].    Labs (5/14):  K 3.8, Cr 0.92, ALT 12, proBNP 1119, TSH 2.571 Labs (6/14):  K 3.8=>4.8, Cr 0.9=>1.7, BUN 59, Hgb 15.1 Labs (7/14):  K 5.2=>5, BUN 34=>26, Cr 1.2=>1.1, Hgb 14.7  Wt Readings from Last 3 Encounters:  04/05/13 279 lb (126.554 kg)  04/05/13 280 lb (127.007 kg)  02/16/13 280 lb (127.007 kg)     Past Medical History  Diagnosis Date  . HYPERLIPIDEMIA   . GOUT   . Morbid obesity   . OBESITY HYPOVENTILATION SYNDROME     a. on home O2.  Marland Kitchen Unspecified hearing loss   . HYPERTENSION   . BRONCHITIS, CHRONIC 01/02/2009  . SKIN RASH 03/12/2010  . PROSTATE SPECIFIC ANTIGEN, ELEVATED 06/09/2007  . Long term  (current) use of anticoagulants   . Complication of anesthesia     "aspiration pneumonia after hand OR" (01/05/2013)  . Permanent atrial fibrillation   . OSA (obstructive sleep apnea)   . DIABETES MELLITUS, TYPE II   . COMMON MIGRAINE     "none since treating for high BP"  . Arthritis     "knees and hands; thoracic area of the spine" (01/05/2013)  . On home oxygen therapy   . Acute right-sided CHF (congestive heart failure)     a. 01/2013.  . Pulmonary HTN     a. multifactorial including obstructive sleep apnea, obesity hypoventilation syndrome and possible pulmonary venous hypertension.    Current Outpatient Prescriptions  Medication Sig Dispense Refill  . albuterol (PROVENTIL HFA;VENTOLIN HFA) 108 (90 BASE) MCG/ACT inhaler Inhale 2 puffs into the lungs every 6 (six) hours as needed for wheezing.  1 Inhaler  11  . allopurinol (ZYLOPRIM) 100 MG tablet Take 100 mg by mouth every morning.      Marland Kitchen apixaban (ELIQUIS) 5 MG TABS tablet Take 1 tablet (5 mg total) by mouth 2 (two) times daily.  180 tablet  1  . atorvastatin (LIPITOR) 20 MG tablet Take 1 tablet (20 mg total) by mouth every  evening.  90 tablet  1  . diltiazem (CARDIZEM CD) 180 MG 24 hr capsule Take 1 capsule (180 mg total) by mouth daily.  90 capsule  1  . furosemide (LASIX) 80 MG tablet Take 0.5 tablets (40 mg total) by mouth 2 (two) times daily.      Marland Kitchen glimepiride (AMARYL) 1 MG tablet 0.5 tab in the evening and 1 tab in the a.m.  135 tablet  3  . lisinopril (PRINIVIL,ZESTRIL) 40 MG tablet Take 40 mg by mouth every morning.      . metoprolol (LOPRESSOR) 50 MG tablet Take 50 mg by mouth 2 (two) times daily.      Marland Kitchen spironolactone (ALDACTONE) 50 MG tablet Take 1 tablet (50 mg total) by mouth daily.  90 tablet  3   No current facility-administered medications for this visit.    Allergies:    Allergies  Allergen Reactions  . Codeine     MAKES HIM GOOFY  . Heparin     ? Abdominal rash 01/2013    Social History:  The patient   reports that he has quit smoking. His smoking use included Cigarettes. He has a 15 pack-year smoking history. He has never used smokeless tobacco. He reports that  drinks alcohol. He reports that he does not use illicit drugs.   ROS:  Please see the history of present illness.   All other systems reviewed and negative.   PHYSICAL EXAM: VS:  BP 124/76  Pulse 73  Ht 5\' 7"  (1.702 m)  Wt 279 lb (126.554 kg)  BMI 43.69 kg/m2 Well nourished, well developed, in no acute distress HEENT: normal Neck: no JVD at 90 Cardiac:  normal S1, S2; irreg irreg rhythm; no murmur Lungs:  clear to auscultation bilaterally, no wheezing, rhonchi or rales Abd: soft, nontender, no hepatomegaly Ext: trace-1+ brawny bilateral LE edema Skin: warm and dry Neuro:  CNs 2-12 intact, no focal abnormalities noted  EKG:  AFib, HR 73     ASSESSMENT AND PLAN:  1. R Sided CHF:  Volume stable. Continue current therapy. 2. Atrial Fibrillation:  Rate controlled.  Remain on Eliquis.     3. Hypertension:  Controlled.  Continue current therapy.  4. Hyperlipidemia:  Continue statin. 5. OSA/OHS:  Continue f/u with pulmonary. 6. Obesity: I have advised him to continue with water exercises and increase slowly. 7. Disposition:  F/u with Dr. Olga Millers in 3 mos.  Signed, Tereso Newcomer, PA-C  04/05/2013 2:23 PM

## 2013-04-06 LAB — BASIC METABOLIC PANEL
CO2: 31 mEq/L (ref 19–32)
Calcium: 9.3 mg/dL (ref 8.4–10.5)
GFR: 71.65 mL/min (ref >60.00)
Potassium: 4.7 mEq/L (ref 3.5–5.1)
Sodium: 140 mEq/L (ref 135–145)

## 2013-04-06 LAB — HEPATIC FUNCTION PANEL
AST: 22 U/L (ref 0–37)
Alkaline Phosphatase: 64 U/L (ref 39–117)
Bilirubin, Direct: 0.2 mg/dL (ref 0.0–0.3)

## 2013-04-06 LAB — LIPID PANEL
HDL: 34.9 mg/dL — ABNORMAL LOW (ref >39.00)
Total CHOL/HDL Ratio: 3
VLDL: 17.8 mg/dL (ref 0.0–40.0)

## 2013-05-01 ENCOUNTER — Ambulatory Visit: Payer: Medicare Other | Admitting: Cardiology

## 2013-05-27 ENCOUNTER — Other Ambulatory Visit (HOSPITAL_COMMUNITY): Payer: Self-pay | Admitting: Physician Assistant

## 2013-06-01 ENCOUNTER — Ambulatory Visit (INDEPENDENT_AMBULATORY_CARE_PROVIDER_SITE_OTHER): Payer: Medicare Other

## 2013-06-01 DIAGNOSIS — Z23 Encounter for immunization: Secondary | ICD-10-CM

## 2013-06-10 ENCOUNTER — Other Ambulatory Visit: Payer: Self-pay | Admitting: Internal Medicine

## 2013-06-17 ENCOUNTER — Other Ambulatory Visit: Payer: Self-pay | Admitting: Cardiology

## 2013-06-28 ENCOUNTER — Other Ambulatory Visit: Payer: Self-pay | Admitting: Physician Assistant

## 2013-06-29 ENCOUNTER — Encounter: Payer: Self-pay | Admitting: Cardiology

## 2013-06-29 ENCOUNTER — Ambulatory Visit (INDEPENDENT_AMBULATORY_CARE_PROVIDER_SITE_OTHER): Payer: Medicare Other | Admitting: Cardiology

## 2013-06-29 ENCOUNTER — Telehealth: Payer: Self-pay | Admitting: Cardiology

## 2013-06-29 VITALS — BP 118/70 | HR 77 | Ht 67.0 in | Wt 279.0 lb

## 2013-06-29 DIAGNOSIS — I10 Essential (primary) hypertension: Secondary | ICD-10-CM | POA: Diagnosis not present

## 2013-06-29 DIAGNOSIS — E785 Hyperlipidemia, unspecified: Secondary | ICD-10-CM | POA: Diagnosis not present

## 2013-06-29 DIAGNOSIS — I4891 Unspecified atrial fibrillation: Secondary | ICD-10-CM | POA: Diagnosis not present

## 2013-06-29 DIAGNOSIS — I2781 Cor pulmonale (chronic): Secondary | ICD-10-CM

## 2013-06-29 DIAGNOSIS — I279 Pulmonary heart disease, unspecified: Secondary | ICD-10-CM | POA: Diagnosis not present

## 2013-06-29 LAB — CBC WITH DIFFERENTIAL/PLATELET
Basophils Absolute: 0 10*3/uL (ref 0.0–0.1)
Basophils Relative: 0.3 % (ref 0.0–3.0)
Eosinophils Absolute: 0.2 10*3/uL (ref 0.0–0.7)
Eosinophils Relative: 1.7 % (ref 0.0–5.0)
HCT: 43.9 % (ref 39.0–52.0)
Lymphocytes Relative: 21 % (ref 12.0–46.0)
Lymphs Abs: 2.6 10*3/uL (ref 0.7–4.0)
MCHC: 33.5 g/dL (ref 30.0–36.0)
MCV: 91.4 fl (ref 78.0–100.0)
Monocytes Absolute: 0.8 10*3/uL (ref 0.1–1.0)
Neutro Abs: 8.9 10*3/uL — ABNORMAL HIGH (ref 1.4–7.7)
Platelets: 263 10*3/uL (ref 150.0–400.0)
RDW: 14.5 % (ref 11.5–14.6)

## 2013-06-29 LAB — BASIC METABOLIC PANEL
BUN: 37 mg/dL — ABNORMAL HIGH (ref 6–23)
CO2: 34 mEq/L — ABNORMAL HIGH (ref 19–32)
Calcium: 9.5 mg/dL (ref 8.4–10.5)
Chloride: 102 mEq/L (ref 96–112)
Glucose, Bld: 75 mg/dL (ref 70–99)
Potassium: 5 mEq/L (ref 3.5–5.1)

## 2013-06-29 NOTE — Assessment & Plan Note (Signed)
Continue statin. 

## 2013-06-29 NOTE — Assessment & Plan Note (Signed)
Most likely multifactorial including obstructive sleep apnea, obesity hypoventilation syndrome and pulmonary venous hypertension. Continue oxygen and present dose of diuretics. Check potassium and renal function.

## 2013-06-29 NOTE — Assessment & Plan Note (Signed)
Blood pressure controlled. Continue present medications. 

## 2013-06-29 NOTE — Assessment & Plan Note (Signed)
Patient remains in permanent atrial fibrillation. Continue beta blocker. Continue apixaban. Check Hgb and renal function.

## 2013-06-29 NOTE — Progress Notes (Signed)
HPI: FU permanent AFib and right side CHF. He has failed DCCV in the past. Myoview 7/09: EF 54%, normal perfusion. Holter 09/2010: AFib with mildly decreased rate. He was admitted 12/2012 with acute right heart failure in the setting of significant pulmonary hypertension which is multifactorial including OSA, OHS and possible pulmonary venous hypertension. RHC 01/08/13 demonstrated elevated R heart pressures (RA 15, RV 44/11, PCWP 21, PA 41/30, CO 4.49, CI 1.8, PA sat 61%). He was transitioned him from Coumadin to Apixaban 5 mg twice a day. Echo 5/14: Mild LVH, EF 55%, mod to severe LAE, mild RVE, mild reduced RVSF, mild RAE. Since he was last seen, he has some dyspnea on exertion but now is on oxygen. No orthopnea or PND. His pedal edema is well-controlled. He denies exertional chest pain, syncope or bleeding.    Current Outpatient Prescriptions  Medication Sig Dispense Refill  . albuterol (PROVENTIL HFA;VENTOLIN HFA) 108 (90 BASE) MCG/ACT inhaler Inhale 2 puffs into the lungs every 6 (six) hours as needed for wheezing.  1 Inhaler  11  . allopurinol (ZYLOPRIM) 100 MG tablet TAKE 1 TABLET ONCE A DAY  90 tablet  2  . atorvastatin (LIPITOR) 20 MG tablet Take 1 tablet (20 mg total) by mouth every evening.  90 tablet  1  . diltiazem (CARDIZEM CD) 180 MG 24 hr capsule Take 1 capsule (180 mg total) by mouth daily.  90 capsule  1  . ELIQUIS 5 MG TABS tablet TAKE 1 TABLET TWO TIMES DAILY  180 tablet  1  . furosemide (LASIX) 80 MG tablet Take 0.5 tablets (40 mg total) by mouth 2 (two) times daily.  90 tablet  0  . glimepiride (AMARYL) 1 MG tablet 0.5 tab in the evening and 1 tab in the a.m.  135 tablet  3  . lisinopril (PRINIVIL,ZESTRIL) 40 MG tablet Take 40 mg by mouth every morning.      . metoprolol (LOPRESSOR) 50 MG tablet Take 50 mg by mouth 2 (two) times daily.      Marland Kitchen spironolactone (ALDACTONE) 50 MG tablet Take 1 tablet (50 mg total) by mouth daily.  90 tablet  3   No current  facility-administered medications for this visit.     Past Medical History  Diagnosis Date  . HYPERLIPIDEMIA   . GOUT   . Morbid obesity   . OBESITY HYPOVENTILATION SYNDROME     a. on home O2.  Marland Kitchen Unspecified hearing loss   . HYPERTENSION   . BRONCHITIS, CHRONIC 01/02/2009  . SKIN RASH 03/12/2010  . PROSTATE SPECIFIC ANTIGEN, ELEVATED 06/09/2007  . Long term (current) use of anticoagulants   . Complication of anesthesia     "aspiration pneumonia after hand OR" (01/05/2013)  . Permanent atrial fibrillation   . OSA (obstructive sleep apnea)   . DIABETES MELLITUS, TYPE II   . COMMON MIGRAINE     "none since treating for high BP"  . Arthritis     "knees and hands; thoracic area of the spine" (01/05/2013)  . On home oxygen therapy   . Acute right-sided CHF (congestive heart failure)     a. 01/2013.  . Pulmonary HTN     a. multifactorial including obstructive sleep apnea, obesity hypoventilation syndrome and possible pulmonary venous hypertension.    Past Surgical History  Procedure Laterality Date  . Appendectomy  1974  . Tendon repair Right 2009    "lacerated his tendon and pulley" Dr. Izora Ribas  . Cardioversion  05/21/2008; ~  06/2008  . Inguinal hernia repair Right   . Hernia repair    . Umbilical hernia repair      History   Social History  . Marital Status: Married    Spouse Name: N/A    Number of Children: 2  . Years of Education: N/A   Occupational History  . sheet metal Advice worker    Social History Main Topics  . Smoking status: Former Smoker -- 1.00 packs/day for 15 years    Types: Cigarettes  . Smokeless tobacco: Never Used     Comment: 01/05/2013 "quit smoking ~ 40 years ago"  . Alcohol Use: Yes     Comment: 01/05/2013 "hasn't had a drink since 1980's; never had problem w/it"  . Drug Use: No  . Sexual Activity: Not Currently   Other Topics Concern  . Not on file   Social History Narrative  . No narrative on file    ROS: no fevers or chills,  productive cough, hemoptysis, dysphasia, odynophagia, melena, hematochezia, dysuria, hematuria, rash, seizure activity, orthopnea, PND,  claudication. Remaining systems are negative.  Physical Exam: Well-developed obese in no acute distress.  Skin is warm and dry.  HEENT is normal.  Neck is supple.  Chest is clear to auscultation with normal expansion.  Cardiovascular exam is irregular Abdominal exam nontender or distended. No masses palpated. Extremities show 1+ edema and chronic skin changes. neuro grossly intact

## 2013-06-29 NOTE — Telephone Encounter (Signed)
Follow Up  Pt returned call for resutls

## 2013-06-29 NOTE — Telephone Encounter (Signed)
Spoke with pt wife, aware of results. Forwarded to dr Oliver Barre for his review

## 2013-06-29 NOTE — Patient Instructions (Signed)
Your physician wants you to follow-up in: 6 MONTHS WITH DR CRENSHAW You will receive a reminder letter in the mail two months in advance. If you don't receive a letter, please call our office to schedule the follow-up appointment.   Your physician recommends that you HAVE LAB WORK TODAY 

## 2013-08-08 ENCOUNTER — Other Ambulatory Visit: Payer: Self-pay | Admitting: Cardiology

## 2013-08-16 ENCOUNTER — Other Ambulatory Visit: Payer: Self-pay | Admitting: Internal Medicine

## 2013-08-29 ENCOUNTER — Other Ambulatory Visit: Payer: Self-pay | Admitting: Cardiology

## 2013-09-08 ENCOUNTER — Other Ambulatory Visit: Payer: Self-pay | Admitting: Cardiology

## 2013-09-11 ENCOUNTER — Ambulatory Visit: Payer: Medicare Other | Admitting: Pulmonary Disease

## 2013-10-01 ENCOUNTER — Other Ambulatory Visit: Payer: Self-pay | Admitting: Cardiology

## 2013-10-03 ENCOUNTER — Ambulatory Visit: Payer: Medicare Other | Admitting: Internal Medicine

## 2013-10-09 ENCOUNTER — Encounter: Payer: Self-pay | Admitting: Internal Medicine

## 2013-10-09 ENCOUNTER — Ambulatory Visit (INDEPENDENT_AMBULATORY_CARE_PROVIDER_SITE_OTHER): Payer: Medicare Other | Admitting: Internal Medicine

## 2013-10-09 ENCOUNTER — Other Ambulatory Visit (INDEPENDENT_AMBULATORY_CARE_PROVIDER_SITE_OTHER): Payer: Medicare Other

## 2013-10-09 VITALS — BP 120/68 | HR 74 | Temp 97.9°F | Ht 67.0 in | Wt 280.2 lb

## 2013-10-09 DIAGNOSIS — E785 Hyperlipidemia, unspecified: Secondary | ICD-10-CM

## 2013-10-09 DIAGNOSIS — E119 Type 2 diabetes mellitus without complications: Secondary | ICD-10-CM | POA: Diagnosis not present

## 2013-10-09 DIAGNOSIS — Z23 Encounter for immunization: Secondary | ICD-10-CM | POA: Diagnosis not present

## 2013-10-09 DIAGNOSIS — R972 Elevated prostate specific antigen [PSA]: Secondary | ICD-10-CM

## 2013-10-09 DIAGNOSIS — I10 Essential (primary) hypertension: Secondary | ICD-10-CM | POA: Diagnosis not present

## 2013-10-09 LAB — CBC WITH DIFFERENTIAL/PLATELET
BASOS ABS: 0 10*3/uL (ref 0.0–0.1)
Basophils Relative: 0.4 % (ref 0.0–3.0)
EOS ABS: 0.3 10*3/uL (ref 0.0–0.7)
Eosinophils Relative: 2.3 % (ref 0.0–5.0)
HCT: 43.7 % (ref 39.0–52.0)
Hemoglobin: 14.3 g/dL (ref 13.0–17.0)
LYMPHS PCT: 24.7 % (ref 12.0–46.0)
Lymphs Abs: 3 10*3/uL (ref 0.7–4.0)
MCHC: 32.6 g/dL (ref 30.0–36.0)
MCV: 93.6 fl (ref 78.0–100.0)
Monocytes Absolute: 0.9 10*3/uL (ref 0.1–1.0)
Monocytes Relative: 7.5 % (ref 3.0–12.0)
Neutro Abs: 7.9 10*3/uL — ABNORMAL HIGH (ref 1.4–7.7)
Neutrophils Relative %: 65.1 % (ref 43.0–77.0)
Platelets: 248 10*3/uL (ref 150.0–400.0)
RBC: 4.67 Mil/uL (ref 4.22–5.81)
RDW: 15.1 % — AB (ref 11.5–14.6)
WBC: 12.2 10*3/uL — ABNORMAL HIGH (ref 4.5–10.5)

## 2013-10-09 LAB — LIPID PANEL
CHOL/HDL RATIO: 3
Cholesterol: 110 mg/dL (ref 0–200)
HDL: 31.5 mg/dL — AB (ref >39.00)
LDL Cholesterol: 55 mg/dL (ref 0–99)
TRIGLYCERIDES: 119 mg/dL (ref 0.0–149.0)
VLDL: 23.8 mg/dL (ref 0.0–40.0)

## 2013-10-09 LAB — URINALYSIS, ROUTINE W REFLEX MICROSCOPIC
BILIRUBIN URINE: NEGATIVE
HGB URINE DIPSTICK: NEGATIVE
Ketones, ur: NEGATIVE
Leukocytes, UA: NEGATIVE
NITRITE: NEGATIVE
RBC / HPF: NONE SEEN (ref 0–?)
Specific Gravity, Urine: 1.015 (ref 1.000–1.030)
Total Protein, Urine: NEGATIVE
Urine Glucose: NEGATIVE
Urobilinogen, UA: 0.2 (ref 0.0–1.0)
pH: 6 (ref 5.0–8.0)

## 2013-10-09 LAB — HEPATIC FUNCTION PANEL
ALK PHOS: 72 U/L (ref 39–117)
ALT: 17 U/L (ref 0–53)
AST: 20 U/L (ref 0–37)
Albumin: 3.6 g/dL (ref 3.5–5.2)
BILIRUBIN DIRECT: 0.2 mg/dL (ref 0.0–0.3)
TOTAL PROTEIN: 7.1 g/dL (ref 6.0–8.3)
Total Bilirubin: 1.1 mg/dL (ref 0.3–1.2)

## 2013-10-09 LAB — BASIC METABOLIC PANEL
BUN: 25 mg/dL — ABNORMAL HIGH (ref 6–23)
CHLORIDE: 103 meq/L (ref 96–112)
CO2: 31 mEq/L (ref 19–32)
Calcium: 9.4 mg/dL (ref 8.4–10.5)
Creatinine, Ser: 1.4 mg/dL (ref 0.4–1.5)
GFR: 55.41 mL/min — ABNORMAL LOW (ref >60.00)
GLUCOSE: 78 mg/dL (ref 70–99)
Potassium: 5.2 mEq/L — ABNORMAL HIGH (ref 3.5–5.1)
Sodium: 144 mEq/L (ref 135–145)

## 2013-10-09 LAB — HEMOGLOBIN A1C: HEMOGLOBIN A1C: 6.6 % — AB (ref 4.6–6.5)

## 2013-10-09 LAB — PSA: PSA: 1.15 ng/mL (ref 0.10–4.00)

## 2013-10-09 LAB — TSH: TSH: 1.04 u[IU]/mL (ref 0.35–5.50)

## 2013-10-09 NOTE — Addendum Note (Signed)
Addended by: Sharon Seller B on: 10/09/2013 12:10 PM   Modules accepted: Orders

## 2013-10-09 NOTE — Progress Notes (Signed)
Pre visit review using our clinic review tool, if applicable. No additional management support is needed unless otherwise documented below in the visit note. 

## 2013-10-09 NOTE — Assessment & Plan Note (Signed)
stable overall by history and exam, recent data reviewed with pt, and pt to continue medical treatment as before,  to f/u any worsening symptoms or concerns Lab Results  Component Value Date   HGBA1C 7.3* 12/20/2012

## 2013-10-09 NOTE — Assessment & Plan Note (Signed)
stable overall by history and exam, recent data reviewed with pt, and pt to continue medical treatment as before,  to f/u any worsening symptoms or concerns Lab Results  Component Value Date   LDLCALC 61 04/05/2013

## 2013-10-09 NOTE — Assessment & Plan Note (Signed)
stable overall by history and exam, recent data reviewed with pt, and pt to continue medical treatment as before,  to f/u any worsening symptoms or concerns BP Readings from Last 3 Encounters:  10/09/13 120/68  06/29/13 118/70  04/05/13 124/76

## 2013-10-09 NOTE — Patient Instructions (Addendum)
You had the new Prevnar pneumonia shot today  Please continue all other medications as before, and refills have been done if requested. Please have the pharmacy call with any other refills you may need.  Please continue your efforts at being more active, low cholesterol diet, and weight control.  You are otherwise up to date with prevention measures today.  Please keep your appointments with your specialists as you have planned  Please go to the LAB in the Basement (turn left off the elevator) for the tests to be done today  You will be contacted by phone if any changes need to be made immediately.  Otherwise, you will receive a letter about your results with an explanation, but please check with MyChart first.  Please remember to sign up for MyChart if you have not done so, as this will be important to you in the future with finding out test results, communicating by private email, and scheduling acute appointments online when needed.  Please return in 6 months, or sooner if needed 

## 2013-10-09 NOTE — Progress Notes (Signed)
Subjective:    Patient ID: [name] [name], male    DOB: Jun 23, 1946, 68 y.o.   MRN: [id number]  HPI  Here to f/u; overall doing ok,  Pt denies chest pain, increased sob or doe, wheezing, orthopnea, PND, increased LE swelling, palpitations, dizziness or syncope.  Pt denies polydipsia, polyuria, or low sugar symptoms such as weakness or confusion improved with po intake.  Pt denies new neurological symptoms such as new headache, or facial or extremity weakness or numbness.   Pt states overall good compliance with meds, has been trying to follow lower cholesterol, diabetic diet, with wt overall stable,  but little exercise however,  Mostly limited due to cardiopulm dz. Due for Prevnar.  No other compalints  Past Medical History  Diagnosis Date  . HYPERLIPIDEMIA   . GOUT   . Morbid obesity   . OBESITY HYPOVENTILATION SYNDROME     a. on home O2.  Marland Kitchen Unspecified hearing loss   . HYPERTENSION   . BRONCHITIS, CHRONIC 01/02/2009  . SKIN RASH 03/12/2010  . PROSTATE SPECIFIC ANTIGEN, ELEVATED 06/09/2007  . Long term (current) use of anticoagulants   . Complication of anesthesia     "aspiration pneumonia after hand OR" (01/05/2013)  . Permanent atrial fibrillation   . OSA (obstructive sleep apnea)   . DIABETES MELLITUS, TYPE II   . COMMON MIGRAINE     "none since treating for high BP"  . Arthritis     "knees and hands; thoracic area of the spine" (01/05/2013)  . On home oxygen therapy   . Acute right-sided CHF (congestive heart failure)     a. 01/2013.  . Pulmonary HTN     a. multifactorial including obstructive sleep apnea, obesity hypoventilation syndrome and possible pulmonary venous hypertension.   Past Surgical History  Procedure Laterality Date  . Appendectomy  1974  . Tendon repair Right 2009    "lacerated his tendon and pulley" Dr. Lenon Curt  . Cardioversion  05/21/2008; ~ 06/2008  . Inguinal hernia repair Right   . Hernia repair    . Umbilical hernia repair      reports that he has quit  smoking. His smoking use included Cigarettes. He has a 15 pack-year smoking history. He has never used smokeless tobacco. He reports that he drinks alcohol. He reports that he does not use illicit drugs. family history includes Alzheimer's disease in his father; Cancer in his father and other; Coronary artery disease in his other. Allergies  Allergen Reactions  . Codeine     MAKES HIM GOOFY  . Heparin     ? Abdominal rash 01/2013   Current Outpatient Prescriptions on File Prior to Visit  Medication Sig Dispense Refill  . albuterol (PROVENTIL HFA;VENTOLIN HFA) 108 (90 BASE) MCG/ACT inhaler Inhale 2 puffs into the lungs every 6 (six) hours as needed for wheezing.  1 Inhaler  11  . allopurinol (ZYLOPRIM) 100 MG tablet TAKE 1 TABLET ONCE A DAY  90 tablet  2  . atorvastatin (LIPITOR) 20 MG tablet TAKE 1 TABLET EVERY EVENING  90 tablet  0  . diltiazem (CARDIZEM CD) 180 MG 24 hr capsule TAKE 1 CAPSULE DAILY  90 capsule  0  . ELIQUIS 5 MG TABS tablet TAKE 1 TABLET TWO TIMES DAILY  180 tablet  1  . furosemide (LASIX) 80 MG tablet TAKE ONE-HALF (1/2) TABLET (40 MG) TWICE A DAY  90 tablet  0  . glimepiride (AMARYL) 1 MG tablet 0.5 tab in the evening and 1  tab in the a.m.  135 tablet  3  . lisinopril (PRINIVIL,ZESTRIL) 40 MG tablet Take 40 mg by mouth every morning.      Marland Kitchen lisinopril (PRINIVIL,ZESTRIL) 40 MG tablet TAKE 1 TABLET ONCE DAILY  90 tablet  2  . metoprolol (LOPRESSOR) 50 MG tablet Take 50 mg by mouth 2 (two) times daily.      . metoprolol (LOPRESSOR) 50 MG tablet TAKE 1 TABLET TWICE A DAY  180 tablet  1  . spironolactone (ALDACTONE) 50 MG tablet Take 1 tablet (50 mg total) by mouth daily.  90 tablet  3   No current facility-administered medications on file prior to visit.   Review of Systems  Constitutional: Negative for unexpected weight change, or unusual diaphoresis  HENT: Negative for tinnitus.   Eyes: Negative for photophobia and visual disturbance.  Respiratory: Negative for choking  and stridor.   Gastrointestinal: Negative for vomiting and blood in stool.  Genitourinary: Negative for hematuria and decreased urine volume.  Musculoskeletal: Negative for acute joint swelling Skin: Negative for color change and wound.  Neurological: Negative for tremors and numbness other than noted  Psychiatric/Behavioral: Negative for decreased concentration or  hyperactivity.       Objective:   Physical Exam BP 120/68  Pulse 74  Temp(Src) 97.9 F (36.6 C) (Oral)  Ht 5\' 7"  (1.702 m)  Wt 280 lb 4 oz (127.121 kg)  BMI 43.88 kg/m2  SpO2 93%- currently on home o2 at 3L  (wears 4L at night) VS noted,  Constitutional: Pt appears well-developed and well-nourished.  HENT: Head: NCAT.  Right Ear: External ear normal.  Left Ear: External ear normal.  Eyes: Conjunctivae and EOM are normal. Pupils are equal, round, and reactive to light.  Neck: Normal range of motion. Neck supple.  Cardiovascular: Normal rate and irregular rhythm.   Pulmonary/Chest: Effort normal and breath sounds decreased bilat.  Abd:  Soft, NT, non-distended, + BS Neurological: Pt is alert. Not confused  Skin: Skin is warm. No erythema.  Chronic 1-2+ edema noted bilat Psychiatric: Pt behavior is normal. Thought content normal.     Assessment & Plan:

## 2013-10-09 NOTE — Assessment & Plan Note (Signed)
Also for psa as he is due 

## 2013-10-19 ENCOUNTER — Other Ambulatory Visit: Payer: Self-pay | Admitting: Cardiology

## 2013-10-19 ENCOUNTER — Other Ambulatory Visit: Payer: Self-pay | Admitting: *Deleted

## 2013-10-19 MED ORDER — APIXABAN 5 MG PO TABS: ORAL_TABLET | ORAL | Status: DC

## 2013-10-24 ENCOUNTER — Other Ambulatory Visit: Payer: Self-pay

## 2013-10-24 MED ORDER — LISINOPRIL 40 MG PO TABS: 40.0000 mg | ORAL_TABLET | Freq: Every day | ORAL | Status: DC

## 2013-11-21 ENCOUNTER — Ambulatory Visit (INDEPENDENT_AMBULATORY_CARE_PROVIDER_SITE_OTHER): Payer: Medicare Other | Admitting: Internal Medicine

## 2013-11-21 ENCOUNTER — Other Ambulatory Visit: Payer: Self-pay | Admitting: Cardiology

## 2013-11-21 ENCOUNTER — Encounter: Payer: Self-pay | Admitting: Internal Medicine

## 2013-11-21 VITALS — BP 114/80 | HR 73 | Temp 98.2°F | Ht 67.0 in | Wt 274.2 lb

## 2013-11-21 DIAGNOSIS — J309 Allergic rhinitis, unspecified: Secondary | ICD-10-CM | POA: Diagnosis not present

## 2013-11-21 DIAGNOSIS — I4891 Unspecified atrial fibrillation: Secondary | ICD-10-CM

## 2013-11-21 DIAGNOSIS — I10 Essential (primary) hypertension: Secondary | ICD-10-CM

## 2013-11-21 MED ORDER — METHYLPREDNISOLONE ACETATE 80 MG/ML IJ SUSP
80.0000 mg | Freq: Once | INTRAMUSCULAR | Status: AC
  Administered 2013-11-21: 80 mg via INTRAMUSCULAR

## 2013-11-21 NOTE — Assessment & Plan Note (Signed)
Stable rate and volume,  to f/u any worsening symptoms or concerns, no change in tx

## 2013-11-21 NOTE — Addendum Note (Signed)
Addended by: Sharon Seller B on: 11/21/2013 04:02 PM   Modules accepted: Orders

## 2013-11-21 NOTE — Progress Notes (Signed)
Pre visit review using our clinic review tool, if applicable. No additional management support is needed unless otherwise documented below in the visit note. 

## 2013-11-21 NOTE — Progress Notes (Signed)
Subjective:    Patient ID: [name] [name], male    DOB: 1946-08-06, 68 y.o.   MRN: [id number]  HPI  Here for acute, Does have 1-2 wks ongoing nasal allergy symptoms with clearish congestion, itch and sneezing, without fever, pain, ST, cough, swelling or wheezing. No prior signficant hx.  Pt denies fever, wt loss, night sweats, loss of appetite, or other constitutional symptoms  On home o2. Pt denies chest pain, increased sob or doe, wheezing, orthopnea, PND, increased LE swelling, palpitations, dizziness or syncope.  Past Medical History  Diagnosis Date  . HYPERLIPIDEMIA   . GOUT   . Morbid obesity   . OBESITY HYPOVENTILATION SYNDROME     a. on home O2.  Marland Kitchen Unspecified hearing loss   . HYPERTENSION   . BRONCHITIS, CHRONIC 01/02/2009  . SKIN RASH 03/12/2010  . PROSTATE SPECIFIC ANTIGEN, ELEVATED 06/09/2007  . Long term (current) use of anticoagulants   . Complication of anesthesia     "aspiration pneumonia after hand OR" (01/05/2013)  . Permanent atrial fibrillation   . OSA (obstructive sleep apnea)   . DIABETES MELLITUS, TYPE II   . COMMON MIGRAINE     "none since treating for high BP"  . Arthritis     "knees and hands; thoracic area of the spine" (01/05/2013)  . On home oxygen therapy   . Acute right-sided CHF (congestive heart failure)     a. 01/2013.  . Pulmonary HTN     a. multifactorial including obstructive sleep apnea, obesity hypoventilation syndrome and possible pulmonary venous hypertension.   Past Surgical History  Procedure Laterality Date  . Appendectomy  1974  . Tendon repair Right 2009    "lacerated his tendon and pulley" Dr. Lenon Curt  . Cardioversion  05/21/2008; ~ 06/2008  . Inguinal hernia repair Right   . Hernia repair    . Umbilical hernia repair      reports that he has quit smoking. His smoking use included Cigarettes. He has a 15 pack-year smoking history. He has never used smokeless tobacco. He reports that he drinks alcohol. He reports that he does not use  illicit drugs. family history includes Alzheimer's disease in his father; Cancer in his father and other; Coronary artery disease in his other. Allergies  Allergen Reactions  . Codeine     MAKES HIM GOOFY  . Heparin     ? Abdominal rash 01/2013   Current Outpatient Prescriptions on File Prior to Visit  Medication Sig Dispense Refill  . albuterol (PROVENTIL HFA;VENTOLIN HFA) 108 (90 BASE) MCG/ACT inhaler Inhale 2 puffs into the lungs every 6 (six) hours as needed for wheezing.  1 Inhaler  11  . allopurinol (ZYLOPRIM) 100 MG tablet TAKE 1 TABLET ONCE A DAY  90 tablet  2  . apixaban (ELIQUIS) 5 MG TABS tablet TAKE 1 TABLET TWO TIMES DAILY  180 tablet  0  . atorvastatin (LIPITOR) 20 MG tablet TAKE 1 TABLET EVERY EVENING  90 tablet  0  . diltiazem (CARDIZEM CD) 180 MG 24 hr capsule TAKE 1 CAPSULE DAILY  90 capsule  0  . furosemide (LASIX) 80 MG tablet TAKE ONE-HALF (1/2) TABLET TWICE A DAY  90 tablet  0  . glimepiride (AMARYL) 1 MG tablet 0.5 tab in the evening and 1 tab in the a.m.  135 tablet  3  . lisinopril (PRINIVIL,ZESTRIL) 40 MG tablet Take 1 tablet (40 mg total) by mouth daily.  90 tablet  3  . metoprolol (LOPRESSOR) 50 MG  tablet TAKE 1 TABLET TWICE A DAY  180 tablet  1  . spironolactone (ALDACTONE) 50 MG tablet Take 1 tablet (50 mg total) by mouth daily.  90 tablet  3   No current facility-administered medications on file prior to visit.   Review of Systems  Constitutional: Negative for unexpected weight change, or unusual diaphoresis  HENT: Negative for tinnitus.   Eyes: Negative for photophobia and visual disturbance.  Respiratory: Negative for choking and stridor.   Gastrointestinal: Negative for vomiting and blood in stool.  Genitourinary: Negative for hematuria and decreased urine volume.  Musculoskeletal: Negative for acute joint swelling Skin: Negative for color change and wound.  Neurological: Negative for tremors and numbness other than noted  Psychiatric/Behavioral:  Negative for decreased concentration or  hyperactivity.       Objective:   Physical Exam BP 114/80  Pulse 73  Temp(Src) 98.2 F (36.8 C) (Oral)  Ht 5\' 7"  (1.702 m)  Wt 274 lb 4 oz (124.399 kg)  BMI 42.94 kg/m2  SpO2 95% VS noted,  Constitutional: Pt appears well-developed and well-nourished.  HENT: Head: NCAT.  Right Ear: External ear normal.  Left Ear: External ear normal.  Eyes: Conjunctivae and EOM are normal. Pupils are equal, round, and reactive to light.  Neck: Normal range of motion. Neck supple.  Bilat tm's with mild erythema.  Deitrick sinus areas non tender.  Pharynx with mild erythema, no exudate Cardiovascular: Normal rate and irregular irreg rhythm.   Pulmonary/Chest: Effort normal and breath sounds decreased, no rales or wheezing.  Abd:  Soft, NT, non-distended, + BS Neurological: Pt is alert. Not confused  Skin: Skin is warm. No erythema.  Psychiatric: Pt behavior is normal. Thought content normal.     Assessment & Plan:

## 2013-11-21 NOTE — Assessment & Plan Note (Signed)
stable overall by history and exam, recent data reviewed with pt, and pt to continue medical treatment as before,  to f/u any worsening symptoms or concerns BP Readings from Last 3 Encounters:  11/21/13 114/80  10/09/13 120/68  06/29/13 118/70

## 2013-11-21 NOTE — Assessment & Plan Note (Signed)
New for him , has home 02, needs to have clear sinuses, for depomedrol IM, then allegra or zyrtec otc prn, consider flonase as well though he doesn't like that idea

## 2013-11-21 NOTE — Patient Instructions (Signed)
You had the steroid shot today  OK to take OTC allegra or zyrtec  As needed as well.  You should not take anything with a decongestant such as sudafed  Please continue all other medications as before, and refills have been done if requested. Please have the pharmacy call with any other refills you may need.  Please continue your efforts at being more active, low cholesterol diet, and weight control.  Please keep your appointments with your specialists as you have planned

## 2013-12-12 DIAGNOSIS — Z0279 Encounter for issue of other medical certificate: Secondary | ICD-10-CM

## 2013-12-14 ENCOUNTER — Other Ambulatory Visit: Payer: Self-pay | Admitting: Cardiology

## 2013-12-30 ENCOUNTER — Other Ambulatory Visit: Payer: Self-pay | Admitting: Cardiology

## 2014-01-20 ENCOUNTER — Other Ambulatory Visit: Payer: Self-pay | Admitting: Cardiology

## 2014-01-30 ENCOUNTER — Other Ambulatory Visit: Payer: Self-pay | Admitting: Internal Medicine

## 2014-02-01 ENCOUNTER — Other Ambulatory Visit: Payer: Self-pay | Admitting: Cardiology

## 2014-02-14 ENCOUNTER — Ambulatory Visit (INDEPENDENT_AMBULATORY_CARE_PROVIDER_SITE_OTHER): Payer: Medicare Other | Admitting: Pulmonary Disease

## 2014-02-14 ENCOUNTER — Encounter: Payer: Self-pay | Admitting: Pulmonary Disease

## 2014-02-14 VITALS — BP 122/80 | HR 81 | Ht 67.5 in | Wt 273.8 lb

## 2014-02-14 DIAGNOSIS — R0989 Other specified symptoms and signs involving the circulatory and respiratory systems: Secondary | ICD-10-CM

## 2014-02-14 DIAGNOSIS — E678 Other specified hyperalimentation: Secondary | ICD-10-CM

## 2014-02-14 DIAGNOSIS — R0609 Other forms of dyspnea: Secondary | ICD-10-CM

## 2014-02-14 DIAGNOSIS — J42 Unspecified chronic bronchitis: Secondary | ICD-10-CM

## 2014-02-14 DIAGNOSIS — G4733 Obstructive sleep apnea (adult) (pediatric): Secondary | ICD-10-CM | POA: Diagnosis not present

## 2014-02-14 NOTE — Assessment & Plan Note (Signed)
Noted to have wheeze on exam that cleared with coughing.  He is not aware of having trouble with cough/wheeze/sputum.  He reports more trouble with allergies this year.  Advised him to try using albuterol as needed >> he is to call if his symptoms get worse.

## 2014-02-14 NOTE — Assessment & Plan Note (Signed)
He is to continue 3 liters oxygen during the day, and 4 liters at night with BiPAP.

## 2014-02-14 NOTE — Assessment & Plan Note (Signed)
He is compliant with therapy and reports benefit from BiPAP.  Will call him with result of download.

## 2014-02-14 NOTE — Patient Instructions (Signed)
Call if you feel like you are getting more chest congestion Will call with results of BiPAP download Follow up in 1 year

## 2014-02-14 NOTE — Progress Notes (Signed)
Chief Complaint  Patient presents with  . Follow-up    Wears CPAP nightly x 8hr. No complaints.     History of Present Illness: [name] [name] is a 68 y.o. male severe OSA, OHS, and chronic bronchitis with history of tobacco abuse.  He has been doing well with BiPAP.  He sleeps 6 to 8 hours per night and uses device for entire time he is asleep.  He has full face mask.  He does not snore when using BiPAP.  He gets up to use the bathroom once or twice.  He is using 3 liters oxygen during the day and  4 liters at night.  He is more active compared to this time last year.  He has started getting more trouble with allergies.  He denies cough, wheeze, sputum, or fever.  His leg swelling as gotten better.  Wt Readings from Last 3 Encounters:  02/14/14 273 lb 12.8 oz (124.195 kg)  11/21/13 274 lb 4 oz (124.399 kg)  10/09/13 280 lb 4 oz (127.121 kg)    TESTS: PSG 06/18/08 >> AHI 64, BPAP 18/9 with 4 liters oxygen Spirometry 04/03/09 >> FEV1 2.08(69%), FEV1% 77 Echo 12/27/12 >> mild LVH, EF 55%, mod/severe LA dilation ONO with BiPAP and 4 liters 01/18/13 >> Test time 8 hrs 55 min. Basal SpO2 91.7%, low SpO2 83%. Spent 32 min with SpO2 < 88%. Auto BiPAP 02/01/13 to 02/14/13 >> Used on 14 of 14 nights with average 9 hrs 58 min.  Average AHI 13 (mostly hypopneas) with median IPAP 12 cm H2O and EPAP 7 cm H2O. PFT 02/16/13 >> FEV1 2.06 (76%), FEV1% 85, TLC 1.64 (74%), DLCO 78%, no BD   [name] [name]  has a past medical history of HYPERLIPIDEMIA; GOUT; Morbid obesity; OBESITY HYPOVENTILATION SYNDROME; Unspecified hearing loss; HYPERTENSION; BRONCHITIS, CHRONIC (01/02/2009); SKIN RASH (03/12/2010); PROSTATE SPECIFIC ANTIGEN, ELEVATED (06/09/2007); Long term (current) use of anticoagulants; Complication of anesthesia; Permanent atrial fibrillation; OSA (obstructive sleep apnea); DIABETES MELLITUS, TYPE II; COMMON MIGRAINE; Arthritis; On home oxygen therapy; Acute right-sided CHF (congestive heart failure); and Pulmonary  HTN.  [name] [name]  has past surgical history that includes Appendectomy (1974); Tendon repair (Right, 2009); Cardioversion (05/21/2008; ~ 06/2008); Inguinal hernia repair (Right); Hernia repair; and Umbilical hernia repair.  Prior to Admission medications   Medication Sig Start Date End Date Taking? Authorizing Provider  albuterol (PROVENTIL HFA;VENTOLIN HFA) 108 (90 BASE) MCG/ACT inhaler Inhale 2 puffs into the lungs every 6 (six) hours as needed for wheezing. 12/20/12  Yes [name], MD  allopurinol (ZYLOPRIM) 100 MG tablet TAKE 1 TABLET BY MOUTH ONCE A DAY 08/20/12  Yes [name], MD  diltiazem (CARDIZEM CD) 300 MG 24 hr capsule TAKE 1 CAPSULE BY MOUTH ONCE DAILY 08/20/12  Yes [name], MD  furosemide (LASIX) 80 MG tablet TAKE 1 TABLET BY MOUTH TWICE DAILY 12/20/12  Yes [name], MD  glimepiride (AMARYL) 1 MG tablet  08/20/12  Yes [name], MD  glucose blood (ONE TOUCH ULTRA TEST) test strip Use one strip two times a day to test blood glucose 12/28/10  Yes [name], MD  lisinopril (PRINIVIL,ZESTRIL) 40 MG tablet TAKE 1 TABLET BY MOUTH ONCE DAILY 08/20/12  Yes [name], MD  metoprolol (LOPRESSOR) 50 MG tablet TAKE 1 TABLET BY MOUTH 2 TIMES A DAY 07/02/12  Yes Lelon Perla, MD  potassium chloride SA (K-DUR,KLOR-CON) 20 MEQ tablet TAKE 1 TABLET BY MOUTH ONCE A DAY  08/20/12  Yes [name], MD  simvastatin (ZOCOR) 40 MG tablet TAKE 1 TABLET BY MOUTH ONCE A DAY 08/20/12  Yes [name], MD  warfarin (COUMADIN) 5 MG tablet Take as directed by coumadin clinic 09/25/12  Yes Lelon Perla, MD    Allergies  Allergen Reactions  . Codeine     MAKES HIM GOOFY  . Heparin     ? Abdominal rash 01/2013     Physical Exam:  General - No distress, wearing oxygen ENT - No sinus tenderness, MP 4, no oral exudate, no LAN Cardiac - s1s2 regular, no murmur Chest - faint b/l wheeze that cleared with coughing Back - No focal tenderness Abd - Soft, non-tender Ext - 1+ edema  b/l Neuro - Normal strength Skin - chronic venous stasis changes Psych - normal mood, and behavior  Assessment/Plan:  Chesley Mires, MD [facility] Pager:  [phone]

## 2014-02-14 NOTE — Assessment & Plan Note (Signed)
Most likely related to obesity, deconditioning, and hypoxemia from OHS.  Improved since last visit, likely related to more consistent use of supplemental oxygen.

## 2014-02-21 ENCOUNTER — Ambulatory Visit (INDEPENDENT_AMBULATORY_CARE_PROVIDER_SITE_OTHER): Payer: Medicare Other | Admitting: Cardiology

## 2014-02-21 ENCOUNTER — Encounter: Payer: Self-pay | Admitting: Cardiology

## 2014-02-21 VITALS — BP 100/66 | HR 68 | Ht 67.5 in | Wt 274.0 lb

## 2014-02-21 DIAGNOSIS — I2781 Cor pulmonale (chronic): Secondary | ICD-10-CM

## 2014-02-21 DIAGNOSIS — I4891 Unspecified atrial fibrillation: Secondary | ICD-10-CM

## 2014-02-21 DIAGNOSIS — I279 Pulmonary heart disease, unspecified: Secondary | ICD-10-CM

## 2014-02-21 DIAGNOSIS — E785 Hyperlipidemia, unspecified: Secondary | ICD-10-CM

## 2014-02-21 DIAGNOSIS — I10 Essential (primary) hypertension: Secondary | ICD-10-CM | POA: Diagnosis not present

## 2014-02-21 LAB — CBC
HCT: 39.8 % (ref 39.0–52.0)
HEMOGLOBIN: 13.7 g/dL (ref 13.0–17.0)
MCH: 30.6 pg (ref 26.0–34.0)
MCHC: 34.4 g/dL (ref 30.0–36.0)
MCV: 88.8 fL (ref 78.0–100.0)
Platelets: 253 10*3/uL (ref 150–400)
RBC: 4.48 MIL/uL (ref 4.22–5.81)
RDW: 16 % — ABNORMAL HIGH (ref 11.5–15.5)
WBC: 13.6 10*3/uL — ABNORMAL HIGH (ref 4.0–10.5)

## 2014-02-21 LAB — BASIC METABOLIC PANEL WITH GFR
BUN: 60 mg/dL — AB (ref 6–23)
CHLORIDE: 107 meq/L (ref 96–112)
CO2: 27 mEq/L (ref 19–32)
CREATININE: 1.77 mg/dL — AB (ref 0.50–1.35)
Calcium: 9.1 mg/dL (ref 8.4–10.5)
GFR, Est African American: 45 mL/min — ABNORMAL LOW
GFR, Est Non African American: 39 mL/min — ABNORMAL LOW
GLUCOSE: 74 mg/dL (ref 70–99)
POTASSIUM: 6.2 meq/L — AB (ref 3.5–5.3)
Sodium: 138 mEq/L (ref 135–145)

## 2014-02-21 NOTE — Patient Instructions (Signed)
Your physician wants you to follow-up in: 6 MONTHS WITH DR CRENSHAW You will receive a reminder letter in the mail two months in advance. If you don't receive a letter, please call our office to schedule the follow-up appointment.   Your physician recommends that you HAVE LAB WORK TODAY 

## 2014-02-21 NOTE — Assessment & Plan Note (Signed)
Patient remains in permanent atrial fibrillation. Continue beta blocker and Cardizem. Continue apixaban. Check hemoglobin and renal function.

## 2014-02-21 NOTE — Assessment & Plan Note (Signed)
Blood pressure controlled. Continue present medications. 

## 2014-02-21 NOTE — Assessment & Plan Note (Signed)
Discussed the importance of weight loss. 

## 2014-02-21 NOTE — Assessment & Plan Note (Signed)
Multifactorial including obesity hypoventilation syndrome, obstructive sleep apnea and pulmonary venous hypertension. Euvolemic on examination. Continue present dose of Lasix. Check potassium and renal function.

## 2014-02-21 NOTE — Progress Notes (Signed)
HPI: FU permanent AFib and right side CHF. He has failed DCCV in the past. Myoview 7/09: EF 54%, normal perfusion. Holter 09/2010: AFib with mildly decreased rate. He was admitted 12/2012 with acute right heart failure in the setting of significant pulmonary hypertension which is multifactorial including OSA, OHS and possible pulmonary venous hypertension. [place] 01/08/13 demonstrated elevated R heart pressures (RA 15, RV 44/11, PCWP 21, PA 41/30, CO 4.49, CI 1.8, PA sat 61%). He was transitioned from Coumadin to Apixaban 5 mg twice a day. Echo 5/14: Mild LVH, EF 55%, mod to severe LAE, mild RVE, mild reduced RVSF, mild RAE. Since he was last seen, he has some dyspnea on exertion unchanged. No orthopnea or PND. His pedal edema is well-controlled. He denies exertional chest pain, syncope or bleeding.   Current Outpatient Prescriptions  Medication Sig Dispense Refill  . albuterol (PROVENTIL HFA;VENTOLIN HFA) 108 (90 BASE) MCG/ACT inhaler Inhale 2 puffs into the lungs every 6 (six) hours as needed for wheezing.  1 Inhaler  11  . allopurinol (ZYLOPRIM) 100 MG tablet TAKE 1 TABLET DAILY  90 tablet  3  . atorvastatin (LIPITOR) 20 MG tablet TAKE 1 TABLET EVERY EVENING  90 tablet  0  . cetirizine (ZYRTEC) 10 MG tablet Take 10 mg by mouth at bedtime.      Marland Kitchen diltiazem (CARDIZEM CD) 180 MG 24 hr capsule TAKE 1 CAPSULE DAILY  90 capsule  0  . ELIQUIS 5 MG TABS tablet TAKE 1 TABLET TWICE A DAY  180 tablet  0  . furosemide (LASIX) 80 MG tablet TAKE ONE-HALF (1/2) TABLET TWICE A DAY  90 tablet  0  . glimepiride (AMARYL) 1 MG tablet 0.5 tab in the evening and 1 tab in the a.m.  135 tablet  3  . lisinopril (PRINIVIL,ZESTRIL) 40 MG tablet Take 1 tablet (40 mg total) by mouth daily.  90 tablet  3  . metoprolol (LOPRESSOR) 50 MG tablet TAKE 1 TABLET TWICE A DAY  180 tablet  0  . spironolactone (ALDACTONE) 50 MG tablet Take 1 tablet (50 mg total) by mouth daily.  90 tablet  3   No current facility-administered  medications for this visit.     Past Medical History  Diagnosis Date  . HYPERLIPIDEMIA   . GOUT   . Morbid obesity   . OBESITY HYPOVENTILATION SYNDROME     a. on home O2.  Marland Kitchen Unspecified hearing loss   . HYPERTENSION   . BRONCHITIS, CHRONIC 01/02/2009  . SKIN RASH 03/12/2010  . PROSTATE SPECIFIC ANTIGEN, ELEVATED 06/09/2007  . Long term (current) use of anticoagulants   . Complication of anesthesia     "aspiration pneumonia after hand OR" (01/05/2013)  . Permanent atrial fibrillation   . OSA (obstructive sleep apnea)   . DIABETES MELLITUS, TYPE II   . COMMON MIGRAINE     "none since treating for high BP"  . Arthritis     "knees and hands; thoracic area of the spine" (01/05/2013)  . On home oxygen therapy   . Acute right-sided CHF (congestive heart failure)     a. 01/2013.  . Pulmonary HTN     a. multifactorial including obstructive sleep apnea, obesity hypoventilation syndrome and possible pulmonary venous hypertension.    Past Surgical History  Procedure Laterality Date  . Appendectomy  1974  . Tendon repair Right 2009    "lacerated his tendon and pulley" Dr. Lenon Curt  . Cardioversion  05/21/2008; ~ 06/2008  . Inguinal  hernia repair Right   . Hernia repair    . Umbilical hernia repair      History   Social History  . Marital Status: Married    Spouse Name: N/A    Number of Children: 2  . Years of Education: N/A   Occupational History  . sheet metal Public librarian    Social History Main Topics  . Smoking status: Former Smoker -- 1.00 packs/day for 15 years    Types: Cigarettes    Quit date: 08/09/1978  . Smokeless tobacco: Never Used     Comment: 01/05/2013 "quit smoking ~ 40 years ago"  . Alcohol Use: Yes     Comment: 01/05/2013 "hasn't had a drink since 1980's; never had problem w/it"  . Drug Use: No  . Sexual Activity: Not Currently   Other Topics Concern  . Not on file   Social History Narrative  . No narrative on file    ROS: no fevers or chills,  productive cough, hemoptysis, dysphasia, odynophagia, melena, hematochezia, dysuria, hematuria, rash, seizure activity, orthopnea, PND, pedal edema, claudication. Remaining systems are negative.  Physical Exam: Well-developed obese in no acute distress.  Skin is warm and dry.  HEENT is normal.  Neck is supple.  Chest is clear to auscultation with normal expansion.  Cardiovascular exam is irregular Abdominal exam nontender or distended. No masses palpated. Extremities show Chronic skin changes and 1+ edema. neuro grossly intact  ECG Atrial fibrillation at a rate of 68. Normal axis. No significant ST changes. Low voltage.

## 2014-02-21 NOTE — Assessment & Plan Note (Signed)
Continue statin. 

## 2014-02-22 ENCOUNTER — Telehealth: Payer: Self-pay | Admitting: Cardiology

## 2014-02-22 ENCOUNTER — Other Ambulatory Visit: Payer: Self-pay | Admitting: *Deleted

## 2014-02-22 ENCOUNTER — Other Ambulatory Visit: Payer: Self-pay | Admitting: Internal Medicine

## 2014-02-22 DIAGNOSIS — E875 Hyperkalemia: Secondary | ICD-10-CM | POA: Diagnosis not present

## 2014-02-22 LAB — BASIC METABOLIC PANEL WITH GFR
BUN: 56 mg/dL — ABNORMAL HIGH (ref 6–23)
CHLORIDE: 110 meq/L (ref 96–112)
CO2: 24 mEq/L (ref 19–32)
Calcium: 9.1 mg/dL (ref 8.4–10.5)
Creat: 1.6 mg/dL — ABNORMAL HIGH (ref 0.50–1.35)
GFR, EST NON AFRICAN AMERICAN: 44 mL/min — AB
GFR, Est African American: 50 mL/min — ABNORMAL LOW
Glucose, Bld: 111 mg/dL — ABNORMAL HIGH (ref 70–99)
POTASSIUM: 6.1 meq/L — AB (ref 3.5–5.3)
Sodium: 144 mEq/L (ref 135–145)

## 2014-02-22 MED ORDER — SODIUM POLYSTYRENE SULFONATE 15 GM/60ML PO SUSP: 30.0000 g | Freq: Once | ORAL | Status: DC

## 2014-02-22 NOTE — Telephone Encounter (Signed)
Spoke with pt wife, they are aware to hold his lisinopril, spironolactone and lasix today. They will come to the office for lab work asap.

## 2014-02-22 NOTE — Telephone Encounter (Signed)
Returning your call. °

## 2014-02-23 DIAGNOSIS — E875 Hyperkalemia: Secondary | ICD-10-CM | POA: Diagnosis not present

## 2014-02-24 ENCOUNTER — Telehealth: Payer: Self-pay | Admitting: Pulmonary Disease

## 2014-02-24 ENCOUNTER — Other Ambulatory Visit: Payer: Self-pay | Admitting: Cardiology

## 2014-02-24 NOTE — Telephone Encounter (Signed)
BiPAP 11/21/13 to 02/18/14 >> used on 90 of 90 nights with average 8 hrs 48 min.  Average AHI 11.7 with median EPAP 9 cm H2O and maximum IPAP 11 cm H2O.  Will have my nurse inform pt that BiPAP report looks good.  No change to current set up needed.

## 2014-02-25 ENCOUNTER — Other Ambulatory Visit: Payer: Self-pay | Admitting: *Deleted

## 2014-02-25 DIAGNOSIS — E875 Hyperkalemia: Secondary | ICD-10-CM

## 2014-02-25 LAB — BASIC METABOLIC PANEL WITH GFR
BUN: 32 mg/dL — ABNORMAL HIGH (ref 6–23)
CHLORIDE: 106 meq/L (ref 96–112)
CO2: 26 meq/L (ref 19–32)
Calcium: 8.7 mg/dL (ref 8.4–10.5)
Creat: 1.2 mg/dL (ref 0.50–1.35)
GFR, Est African American: 71 mL/min
GFR, Est Non African American: 62 mL/min
Glucose, Bld: 83 mg/dL (ref 70–99)
Potassium: 5.5 mEq/L — ABNORMAL HIGH (ref 3.5–5.3)
SODIUM: 139 meq/L (ref 135–145)

## 2014-02-25 NOTE — Telephone Encounter (Signed)
diltiazem (CARDIZEM CD) 180 MG 24 hr capsule  TAKE 1 CAPSULE DAILY   90 capsule   Your physician wants you to follow-up in: [place] will receive a reminder letter in the mail two months in advance. If you don't receive a letter, please call our office to schedule the follow-up appointment.  Lelon Perla, MD at 02/21/2014 11:45 AM

## 2014-02-26 NOTE — Telephone Encounter (Signed)
Results have been explained to patient wife, pt expressed understanding. Nothing further needed.  

## 2014-02-27 ENCOUNTER — Other Ambulatory Visit: Payer: Self-pay | Admitting: *Deleted

## 2014-02-27 ENCOUNTER — Encounter: Payer: Self-pay | Admitting: Cardiology

## 2014-02-27 DIAGNOSIS — E875 Hyperkalemia: Secondary | ICD-10-CM

## 2014-02-27 NOTE — Telephone Encounter (Signed)
This encounter was created in error - please disregard.

## 2014-02-27 NOTE — Telephone Encounter (Signed)
Please call her, concerning his medicine.

## 2014-03-05 ENCOUNTER — Other Ambulatory Visit: Payer: Self-pay | Admitting: Internal Medicine

## 2014-03-06 ENCOUNTER — Encounter: Payer: Self-pay | Admitting: Cardiology

## 2014-03-06 ENCOUNTER — Other Ambulatory Visit: Payer: Self-pay | Admitting: *Deleted

## 2014-03-06 DIAGNOSIS — E875 Hyperkalemia: Secondary | ICD-10-CM | POA: Diagnosis not present

## 2014-03-06 NOTE — Telephone Encounter (Signed)
This encounter was created in error - please disregard.

## 2014-03-07 LAB — BASIC METABOLIC PANEL WITH GFR
BUN: 25 mg/dL — ABNORMAL HIGH (ref 6–23)
CO2: 28 meq/L (ref 19–32)
Calcium: 9 mg/dL (ref 8.4–10.5)
Chloride: 103 mEq/L (ref 96–112)
Creat: 1.23 mg/dL (ref 0.50–1.35)
GFR, EST AFRICAN AMERICAN: 69 mL/min
GFR, Est Non African American: 60 mL/min
GLUCOSE: 163 mg/dL — AB (ref 70–99)
POTASSIUM: 4.7 meq/L (ref 3.5–5.3)
Sodium: 140 mEq/L (ref 135–145)

## 2014-03-14 ENCOUNTER — Other Ambulatory Visit: Payer: Self-pay | Admitting: Cardiology

## 2014-03-18 ENCOUNTER — Telehealth: Payer: Self-pay | Admitting: Cardiology

## 2014-03-18 DIAGNOSIS — I50811 Acute right heart failure: Secondary | ICD-10-CM

## 2014-03-18 MED ORDER — FUROSEMIDE 40 MG PO TABS: 60.0000 mg | ORAL_TABLET | Freq: Two times a day (BID) | ORAL | Status: DC

## 2014-03-18 NOTE — Telephone Encounter (Signed)
Spoke with pt wife, Aware of dr crenshaw's recommendations.  

## 2014-03-18 NOTE — Telephone Encounter (Signed)
Spoke with pt wife, pt is currently off spironolactone and lisinopril. His furosemide is 40 mg bid. He has been gaining about 1 lb a day, when he was here he was 274 and now he is above 280 lb. He does not have SOB or orthopnea. His abdomin is distended. Will forward for dr [name] review

## 2014-03-18 NOTE — Telephone Encounter (Signed)
Left message for pt to call.

## 2014-03-18 NOTE — Telephone Encounter (Signed)
Mrs.Sheils returned your call please call.. Thanks

## 2014-03-18 NOTE — Telephone Encounter (Signed)
[name] is calling concerning [name] [name] . His medications were change and was told to call if he starting gaining fluid weight and she wants to speak to someone about that .Marland Kitchen Please call  Thanks

## 2014-03-18 NOTE — Telephone Encounter (Signed)
Change lasix to 60 mg po BID; bmet one week Kirk Ruths

## 2014-03-25 DIAGNOSIS — I509 Heart failure, unspecified: Secondary | ICD-10-CM | POA: Diagnosis not present

## 2014-03-26 LAB — BASIC METABOLIC PANEL WITH GFR
BUN: 18 mg/dL (ref 6–23)
CHLORIDE: 101 meq/L (ref 96–112)
CO2: 33 mEq/L — ABNORMAL HIGH (ref 19–32)
CREATININE: 1.05 mg/dL (ref 0.50–1.35)
Calcium: 9 mg/dL (ref 8.4–10.5)
GFR, Est African American: 84 mL/min
GFR, Est Non African American: 73 mL/min
Glucose, Bld: 186 mg/dL — ABNORMAL HIGH (ref 70–99)
Potassium: 4 mEq/L (ref 3.5–5.3)
Sodium: 142 mEq/L (ref 135–145)

## 2014-03-27 ENCOUNTER — Encounter: Payer: Self-pay | Admitting: Cardiology

## 2014-03-27 NOTE — Telephone Encounter (Signed)
Returning your call. °

## 2014-03-27 NOTE — Telephone Encounter (Signed)
This encounter was created in error - please disregard.

## 2014-04-03 ENCOUNTER — Ambulatory Visit (INDEPENDENT_AMBULATORY_CARE_PROVIDER_SITE_OTHER): Payer: Medicare Other | Admitting: Cardiology

## 2014-04-03 ENCOUNTER — Encounter: Payer: Self-pay | Admitting: Cardiology

## 2014-04-03 ENCOUNTER — Ambulatory Visit: Payer: Medicare Other | Admitting: Cardiology

## 2014-04-03 VITALS — BP 122/80 | HR 70 | Ht 68.0 in | Wt 284.3 lb

## 2014-04-03 DIAGNOSIS — I50811 Acute right heart failure: Secondary | ICD-10-CM

## 2014-04-03 DIAGNOSIS — I509 Heart failure, unspecified: Secondary | ICD-10-CM

## 2014-04-03 MED ORDER — FUROSEMIDE 40 MG PO TABS: ORAL_TABLET | ORAL | Status: DC

## 2014-04-03 NOTE — Progress Notes (Signed)
HPI: FU permanent AFib and right side CHF. He has failed DCCV in the past. Myoview 7/09: EF 54%, normal perfusion. Holter 09/2010: AFib with mildly decreased rate. He was admitted 12/2012 with acute right heart failure in the setting of significant pulmonary hypertension which is multifactorial including OSA, OHS and possible pulmonary venous hypertension. [place] 01/08/13 demonstrated elevated R heart pressures (RA 15, RV 44/11, PCWP 21, PA 41/30, CO 4.49, CI 1.8, PA sat 61%). He was transitioned from Coumadin to Apixaban 5 mg twice a day. Echo 5/14: Mild LVH, EF 55%, mod to severe LAE, mild RVE, mild reduced RVSF, mild RAE. Recently seen and noted to have significant hyperkalemia and renal insuff. Medications adjusted (ACEI and spironolactone DCed and lasix reduced); K and renal function improved. Since last seen, He has some dyspnea on exertion which is unchanged. He has gained 10 pounds. No chest pain. No change in pedal edema which is chronic.   Current Outpatient Prescriptions  Medication Sig Dispense Refill  . albuterol (PROVENTIL HFA;VENTOLIN HFA) 108 (90 BASE) MCG/ACT inhaler Inhale 2 puffs into the lungs every 6 (six) hours as needed for wheezing.  1 Inhaler  11  . allopurinol (ZYLOPRIM) 100 MG tablet TAKE 1 TABLET DAILY  90 tablet  3  . apixaban (ELIQUIS) 5 MG TABS tablet Take 1 tablet (5 mg total) by mouth 2 (two) times daily.  180 tablet  0  . atorvastatin (LIPITOR) 20 MG tablet Take 1 tablet (20 mg total) by mouth daily.  90 tablet  0  . cetirizine (ZYRTEC) 10 MG tablet Take 10 mg by mouth at bedtime.      Marland Kitchen diltiazem (CARDIZEM CD) 180 MG 24 hr capsule TAKE 1 CAPSULE DAILY (NEED APPOINTMENT)  90 capsule  1  . furosemide (LASIX) 40 MG tablet Take 80 mg by mouth daily.      Marland Kitchen glimepiride (AMARYL) 1 MG tablet TAKE 1 TABLET IN THE MORNING AND ONE-HALF (1/2) TABLET IN THE EVENING  135 tablet  2  . metoprolol (LOPRESSOR) 50 MG tablet TAKE 1 TABLET TWICE A DAY  180 tablet  0   No current  facility-administered medications for this visit.     Past Medical History  Diagnosis Date  . HYPERLIPIDEMIA   . GOUT   . Morbid obesity   . OBESITY HYPOVENTILATION SYNDROME     a. on home O2.  Marland Kitchen Unspecified hearing loss   . HYPERTENSION   . BRONCHITIS, CHRONIC 01/02/2009  . SKIN RASH 03/12/2010  . PROSTATE SPECIFIC ANTIGEN, ELEVATED 06/09/2007  . Long term (current) use of anticoagulants   . Complication of anesthesia     "aspiration pneumonia after hand OR" (01/05/2013)  . Permanent atrial fibrillation   . OSA (obstructive sleep apnea)   . DIABETES MELLITUS, TYPE II   . COMMON MIGRAINE     "none since treating for high BP"  . Arthritis     "knees and hands; thoracic area of the spine" (01/05/2013)  . On home oxygen therapy   . Acute right-sided CHF (congestive heart failure)     a. 01/2013.  . Pulmonary HTN     a. multifactorial including obstructive sleep apnea, obesity hypoventilation syndrome and possible pulmonary venous hypertension.    Past Surgical History  Procedure Laterality Date  . Appendectomy  1974  . Tendon repair Right 2009    "lacerated his tendon and pulley" Dr. Lenon Curt  . Cardioversion  05/21/2008; ~ 06/2008  . Inguinal hernia repair Right   .  Hernia repair    . Umbilical hernia repair      History   Social History  . Marital Status: Married    Spouse Name: N/A    Number of Children: 2  . Years of Education: N/A   Occupational History  . sheet metal Public librarian    Social History Main Topics  . Smoking status: Former Smoker -- 1.00 packs/day for 15 years    Types: Cigarettes    Quit date: 08/09/1978  . Smokeless tobacco: Never Used     Comment: 01/05/2013 "quit smoking ~ 40 years ago"  . Alcohol Use: Yes     Comment: 01/05/2013 "hasn't had a drink since 1980's; never had problem w/it"  . Drug Use: No  . Sexual Activity: Not Currently   Other Topics Concern  . Not on file   Social History Narrative  . No narrative on file    ROS: no  fevers or chills, productive cough, hemoptysis, dysphasia, odynophagia, melena, hematochezia, dysuria, hematuria, rash, seizure activity, orthopnea, PND, claudication. Remaining systems are negative.  Physical Exam: Well-developed obese in no acute distress.  Skin is warm and dry.  HEENT is normal.  Neck is supple.  Chest is clear to auscultation with normal expansion.  Cardiovascular exam is irregular Abdominal exam nontender or distended. No masses palpated. Extremities show 1+ edema And chronic skin change neuro grossly intact

## 2014-04-03 NOTE — Patient Instructions (Signed)
Your physician recommends that you schedule a follow-up appointment in: [place] 80 MG TWICE DAILY X 3 DAYS THEN DECREASE TO 80 MG IN THE MORNING AND 40 MG IN THE AFTERNOON  Your physician recommends that you return for lab work in [place]  Sodium and Fluid Restriction Some health conditions may require you to restrict your sodium and fluid intake. Sodium is part of the salt in the blood. Sodium may be restricted because when you take in a lot of salt, you become thirsty. Limiting salt with help you become less thirsty and may make it easier to restrict fluid. Talk to your caregiver or dietician about how many cups of fluid and how many milligrams of sodium you are allowed each day. If your caregiver has restricted your sodium and fluids, usually the amount you can drink depends on several things, such as:  Your urine output.  How much fluid you are retaining.  Your blood pressure. Every 2 cups (500 mL) of fluid retained in the body becomes an extra 1 pound (0.5 kg) of body weight. The following are examples of some fluids you will have to restrict:  Tea, coffee, soda, lemonade, milk, water, and juice.  Alcoholic beverages.  Cream.  Gravy.  Ice cubes.  Soup and broth. The following are foods that become liquid at room temperature. These foods will count towards your fluid intake.  Ice cream and ice milk.  Frozen yogurt and sherbet.  Frozen ice pops.  Flavored gelatin. YOU MAY BE TAKING IN TOO MUCH FLUID IF:  Your weight increases.  Your face, hands, legs, feet, and abdomen start to swell.  You have trouble breathing. HOME CARE INSTRUCTIONS If you follow a low sodium diet closely, you will eat approximately 1,500 mg of sodium a day.   Avoid salty foods. This increases your thirst and makes fluid control more difficult. Foods high in sodium include:  Most canned foods, including most meats.  Most processed foods, including most  meats.  Cheese.  Dried pasta and rice mixes.  Snack foods (chips, popcorn, pretzels, cheese puffs, salted nuts).  Dips, sauces, and salad dressings.  Do not use salt in cooking or add salt to your meal. Lacinda Axon with herbs and spices, but not those that have salt in the name. Ask your caregiver if it is okay to use salt substitutes.  Eat home-prepared meals. Use fresh ingredients. Avoid canned, frozen, or packaged meals.  Read food labels to see how much sodium is in the food. Know how much sodium you are allowed each day.  When eating out, ask for dressings and sauces on the side.  Weigh yourself every morning with an empty bladder before you eat or drink. If your weight is going up, you are retaining fluid.  Freeze fruit juice or water in an ice cube tray. Use this as part of your fluid allowance.  Brush your teeth often or rinse your mouth with mouthwash to help your dry mouth. Lemon wedges, hard sour candies, chewing gum, or breath spray may help to moisten your mouth too.  Add a slice of fresh lemon or lemon juice to water or ice. This helps satisfy your thirst.  Try frozen fruits between meals, such as grapes or strawberries.  Swallow your pills along with meals or soft foods. This helps you save your fluid for something you enjoy.  Use small cups and glasses and learn to sip fluids slowly.  Keep your home cooler.  Keep the air in your home as humid as possible. Dry air increases thirst.  Avoid being out in the hot sun. Each morning, fill a jug with the amount of water you are allowed for the day. You can use this water as a guideline for fluid allowance. Each time you take in fluid, pour an equal amount of water out of the container. This helps you to see how much fluid you are taking in. It also helps plan your fluid intake for the rest of the day. CONVERSIONS TO HELP MEASURE FLUID INTAKE  1 cup equals 8 oz (240 mL).   cup equals 6 oz (180 mL).   cup equals 5  oz (160  mL).   cup equals 4 oz (120 mL).   cup equals 2  oz (80 mL).   cup equals 2 oz (60 mL).  2 tbs equals 1 oz (30 mL). Document Released: 05/23/2007 Document Revised: 01/25/2012 Document Reviewed: 01/07/2012 [facility] Patient Information 2015 [place], [place]. This information is not intended to replace advice given to you by your health care provider. Make sure you discuss any questions you have with your health care provider. Low-Sodium Eating Plan Sodium raises blood pressure and causes water to be held in the body. Getting less sodium from food will help lower your blood pressure, reduce any swelling, and protect your heart, liver, and kidneys. We get sodium by adding salt (sodium chloride) to food. Most of our sodium comes from canned, boxed, and frozen foods. Restaurant foods, fast foods, and pizza are also very high in sodium. Even if you take medicine to lower your blood pressure or to reduce fluid in your body, getting less sodium from your food is important. WHAT IS MY PLAN? Most people should limit their sodium intake to 2,300 mg a day. Your health care provider recommends that you limit your sodium intake to __________ a day.  WHAT DO I NEED TO KNOW ABOUT THIS EATING PLAN? For the low-sodium eating plan, you will follow these general guidelines:  Choose foods with a % Daily Value for sodium of less than 5% (as listed on the food label).   Use salt-free seasonings or herbs instead of table salt or sea salt.   Check with your health care provider or pharmacist before using salt substitutes.   Eat fresh foods.  Eat more vegetables and fruits.  Limit canned vegetables. If you do use them, rinse them well to decrease the sodium.   Limit cheese to 1 oz (28 g) per day.   Eat lower-sodium products, often labeled as "lower sodium" or "no salt added."  Avoid foods that contain monosodium glutamate (MSG). MSG is sometimes added to [place] food and some canned foods.  Check  food labels (Nutrition Facts labels) on foods to learn how much sodium is in one serving.  Eat more home-cooked food and less restaurant, buffet, and fast food.  When eating at a restaurant, ask that your food be prepared with less salt or none, if possible.  HOW DO I READ FOOD LABELS FOR SODIUM INFORMATION? The Nutrition Facts label lists the amount of sodium in one serving of the food. If you eat more than one serving, you must multiply the listed amount of sodium by the number of servings. Food labels may also identify foods as:  Sodium free--Less than 5 mg in a serving.  Very low sodium--35 mg or less in a serving.  Low sodium--140 mg or less in a serving.  Light in sodium--50%  less sodium in a serving. For example, if a food that usually has 300 mg of sodium is changed to become light in sodium, it will have 150 mg of sodium.  Reduced sodium--25% less sodium in a serving. For example, if a food that usually has 400 mg of sodium is changed to reduced sodium, it will have 300 mg of sodium. WHAT FOODS CAN I EAT? Grains Low-sodium cereals, including oats, puffed wheat and rice, and shredded wheat cereals. Low-sodium crackers. Unsalted rice and pasta. Lower-sodium bread.  Vegetables Frozen or fresh vegetables. Low-sodium or reduced-sodium canned vegetables. Low-sodium or reduced-sodium tomato sauce and paste. Low-sodium or reduced-sodium tomato and vegetable juices.  Fruits Fresh, frozen, and canned fruit. Fruit juice.  Meat and Other Protein Products Low-sodium canned tuna and salmon. Fresh or frozen meat, poultry, seafood, and fish. Lamb. Unsalted nuts. Dried beans, peas, and lentils without added salt. Unsalted canned beans. Homemade soups without salt. Eggs.  Dairy Milk. Soy milk. Ricotta cheese. Low-sodium or reduced-sodium cheeses. Yogurt.  Condiments Fresh and dried herbs and spices. Salt-free seasonings. Onion and garlic powders. Low-sodium varieties of mustard and  ketchup. Lemon juice.  Fats and Oils Reduced-sodium salad dressings. Unsalted butter.  Other Unsalted popcorn and pretzels.  The items listed above may not be a complete list of recommended foods or beverages. Contact your dietitian for more options. WHAT FOODS ARE NOT RECOMMENDED? Grains Instant hot cereals. Bread stuffing, pancake, and biscuit mixes. Croutons. Seasoned rice or pasta mixes. Noodle soup cups. Boxed or frozen macaroni and cheese. Self-rising flour. Regular salted crackers. Vegetables Regular canned vegetables. Regular canned tomato sauce and paste. Regular tomato and vegetable juices. Frozen vegetables in sauces. Salted french fries. Olives. Angie Fava. Relishes. Sauerkraut. Salsa. Meat and Other Protein Products Salted, canned, smoked, spiced, or pickled meats, seafood, or fish. Bacon, ham, sausage, hot dogs, corned beef, chipped beef, and packaged luncheon meats. Salt pork. Jerky. Pickled herring. Anchovies, regular canned tuna, and sardines. Salted nuts. Dairy Processed cheese and cheese spreads. Cheese curds. Blue cheese and cottage cheese. Buttermilk.  Condiments Onion and garlic salt, seasoned salt, table salt, and sea salt. Canned and packaged gravies. Worcestershire sauce. Tartar sauce. Barbecue sauce. Teriyaki sauce. Soy sauce, including reduced sodium. Steak sauce. Fish sauce. Oyster sauce. Cocktail sauce. Horseradish. Regular ketchup and mustard. Meat flavorings and tenderizers. Bouillon cubes. Hot sauce. Tabasco sauce. Marinades. Taco seasonings. Relishes. Fats and Oils Regular salad dressings. Salted butter. Margarine. Ghee. Bacon fat.  Other Potato and tortilla chips. Corn chips and puffs. Salted popcorn and pretzels. Canned or dried soups. Pizza. Frozen entrees and pot pies.  The items listed above may not be a complete list of foods and beverages to avoid. Contact your dietitian for more information. Document Released: 01/15/2002 Document Revised:  07/31/2013 Document Reviewed: 05/30/2013 [facility] Patient Information 2015 [place], [place]. This information is not intended to replace advice given to you by your health care provider. Make sure you discuss any questions you have with your health care provider.

## 2014-04-03 NOTE — Assessment & Plan Note (Signed)
Discussed weight loss. 

## 2014-04-03 NOTE — Assessment & Plan Note (Signed)
Patient's volume status is worse. We discontinued his spironolactone and lisinopril because of significant hyperkalemia and renal insufficiency. This has improved. Increase Lasix to 80 mg twice a day for 3 days. Then continue at 80 mg in the morning and 40 mg in the evening. Check potassium and renal function in one week. Patient instructed on low sodium diet. We'll fluid restrict to 1.5 L daily.

## 2014-04-03 NOTE — Assessment & Plan Note (Signed)
Blood pressure controlled. Continue present medications. 

## 2014-04-03 NOTE — Assessment & Plan Note (Signed)
Continue cardizem and Toprol. Continue apixaban.

## 2014-04-03 NOTE — Assessment & Plan Note (Signed)
Management per primary care. 

## 2014-04-10 DIAGNOSIS — I509 Heart failure, unspecified: Secondary | ICD-10-CM | POA: Diagnosis not present

## 2014-04-10 LAB — BASIC METABOLIC PANEL WITH GFR
BUN: 15 mg/dL (ref 6–23)
CO2: 34 meq/L — AB (ref 19–32)
CREATININE: 1.1 mg/dL (ref 0.50–1.35)
Calcium: 8.8 mg/dL (ref 8.4–10.5)
Chloride: 102 mEq/L (ref 96–112)
GFR, Est African American: 79 mL/min
GFR, Est Non African American: 69 mL/min
Glucose, Bld: 97 mg/dL (ref 70–99)
Potassium: 4.1 mEq/L (ref 3.5–5.3)
Sodium: 143 mEq/L (ref 135–145)

## 2014-04-11 ENCOUNTER — Ambulatory Visit (INDEPENDENT_AMBULATORY_CARE_PROVIDER_SITE_OTHER): Payer: Medicare Other | Admitting: Internal Medicine

## 2014-04-11 ENCOUNTER — Telehealth: Payer: Self-pay | Admitting: Cardiology

## 2014-04-11 ENCOUNTER — Other Ambulatory Visit (INDEPENDENT_AMBULATORY_CARE_PROVIDER_SITE_OTHER): Payer: Medicare Other

## 2014-04-11 ENCOUNTER — Encounter: Payer: Self-pay | Admitting: Internal Medicine

## 2014-04-11 VITALS — BP 108/76 | HR 82 | Temp 98.2°F | Wt 281.2 lb

## 2014-04-11 DIAGNOSIS — Z23 Encounter for immunization: Secondary | ICD-10-CM | POA: Diagnosis not present

## 2014-04-11 DIAGNOSIS — E119 Type 2 diabetes mellitus without complications: Secondary | ICD-10-CM

## 2014-04-11 DIAGNOSIS — I10 Essential (primary) hypertension: Secondary | ICD-10-CM

## 2014-04-11 DIAGNOSIS — E785 Hyperlipidemia, unspecified: Secondary | ICD-10-CM

## 2014-04-11 LAB — LIPID PANEL
CHOLESTEROL: 98 mg/dL (ref 0–200)
HDL: 27.8 mg/dL — AB (ref >39.00)
LDL Cholesterol: 46 mg/dL (ref 0–99)
NonHDL: 70.2
Total CHOL/HDL Ratio: 4
Triglycerides: 121 mg/dL (ref 0.0–149.0)
VLDL: 24.2 mg/dL (ref 0.0–40.0)

## 2014-04-11 LAB — HEMOGLOBIN A1C: Hgb A1c MFr Bld: 5.7 % (ref 4.6–6.5)

## 2014-04-11 LAB — HEPATIC FUNCTION PANEL
ALBUMIN: 3.4 g/dL — AB (ref 3.5–5.2)
ALK PHOS: 72 U/L (ref 39–117)
ALT: 16 U/L (ref 0–53)
AST: 17 U/L (ref 0–37)
BILIRUBIN DIRECT: 0.2 mg/dL (ref 0.0–0.3)
Total Bilirubin: 1.5 mg/dL — ABNORMAL HIGH (ref 0.2–1.2)
Total Protein: 7.2 g/dL (ref 6.0–8.3)

## 2014-04-11 LAB — MICROALBUMIN / CREATININE URINE RATIO
Creatinine,U: 14 mg/dL
MICROALB/CREAT RATIO: 6.4 mg/g (ref 0.0–30.0)
Microalb, Ur: 0.9 mg/dL (ref 0.0–1.9)

## 2014-04-11 MED ORDER — METOPROLOL TARTRATE 50 MG PO TABS: 50.0000 mg | ORAL_TABLET | Freq: Two times a day (BID) | ORAL | Status: DC

## 2014-04-11 NOTE — Telephone Encounter (Signed)
Spoke with pt wife aware of lab results. Refill sent into the pharm

## 2014-04-11 NOTE — Patient Instructions (Addendum)

## 2014-04-11 NOTE — Progress Notes (Signed)
Pre visit review using our clinic review tool, if applicable. No additional management support is needed unless otherwise documented below in the visit note. 

## 2014-04-11 NOTE — Progress Notes (Signed)
Subjective:    Patient ID: [name] [name], male    DOB: September 22, 1945, 68 y.o.   MRN: [id number]  HPI  Here to f/u with wife; overall doing ok,  Pt denies chest pain, increased sob or doe, wheezing, orthopnea, PND, increased LE swelling, palpitations, dizziness or syncope.  Pt denies polydipsia, polyuria, or low sugar symptoms such as weakness or confusion improved with po intake.  Pt denies new neurological symptoms such as new headache, or facial or extremity weakness or numbness.   Pt states overall good compliance with meds, has been trying to follow lower cholesterol, diabetic diet, with wt overall stable,.  For flu shot today \ Past Medical History  Diagnosis Date  . HYPERLIPIDEMIA   . GOUT   . Morbid obesity   . OBESITY HYPOVENTILATION SYNDROME     a. on home O2.  Marland Kitchen Unspecified hearing loss   . HYPERTENSION   . BRONCHITIS, CHRONIC 01/02/2009  . SKIN RASH 03/12/2010  . PROSTATE SPECIFIC ANTIGEN, ELEVATED 06/09/2007  . Long term (current) use of anticoagulants   . Complication of anesthesia     "aspiration pneumonia after hand OR" (01/05/2013)  . Permanent atrial fibrillation   . OSA (obstructive sleep apnea)   . DIABETES MELLITUS, TYPE II   . COMMON MIGRAINE     "none since treating for high BP"  . Arthritis     "knees and hands; thoracic area of the spine" (01/05/2013)  . On home oxygen therapy   . Acute right-sided CHF (congestive heart failure)     a. 01/2013.  . Pulmonary HTN     a. multifactorial including obstructive sleep apnea, obesity hypoventilation syndrome and possible pulmonary venous hypertension.   Past Surgical History  Procedure Laterality Date  . Appendectomy  1974  . Tendon repair Right 2009    "lacerated his tendon and pulley" Dr. Lenon Curt  . Cardioversion  05/21/2008; ~ 06/2008  . Inguinal hernia repair Right   . Hernia repair    . Umbilical hernia repair      reports that he quit smoking about 35 years ago. His smoking use included Cigarettes. He has a 15  pack-year smoking history. He has never used smokeless tobacco. He reports that he drinks alcohol. He reports that he does not use illicit drugs. family history includes Alzheimer's disease in his father; Cancer in his father and other; Coronary artery disease in his other. Allergies  Allergen Reactions  . Codeine     MAKES HIM GOOFY  . Heparin     ? Abdominal rash 01/2013   Current Outpatient Prescriptions on File Prior to Visit  Medication Sig Dispense Refill  . albuterol (PROVENTIL HFA;VENTOLIN HFA) 108 (90 BASE) MCG/ACT inhaler Inhale 2 puffs into the lungs every 6 (six) hours as needed for wheezing.  1 Inhaler  11  . allopurinol (ZYLOPRIM) 100 MG tablet TAKE 1 TABLET DAILY  90 tablet  3  . apixaban (ELIQUIS) 5 MG TABS tablet Take 1 tablet (5 mg total) by mouth 2 (two) times daily.  180 tablet  0  . atorvastatin (LIPITOR) 20 MG tablet Take 1 tablet (20 mg total) by mouth daily.  90 tablet  0  . cetirizine (ZYRTEC) 10 MG tablet Take 10 mg by mouth at bedtime.      Marland Kitchen diltiazem (CARDIZEM CD) 180 MG 24 hr capsule TAKE 1 CAPSULE DAILY (NEED APPOINTMENT)  90 capsule  1  . furosemide (LASIX) 40 MG tablet TAKE 80 MG IN THE MORNING AND 40  MG IN THE AFTERNOON  30 tablet    . glimepiride (AMARYL) 1 MG tablet TAKE 1 TABLET IN THE MORNING AND ONE-HALF (1/2) TABLET IN THE EVENING  135 tablet  2   No current facility-administered medications on file prior to visit.   Review of Systems  Constitutional: Negative for unusual diaphoresis or other sweats  HENT: Negative for ringing in ear Eyes: Negative for double vision or worsening visual disturbance.  Respiratory: Negative for choking and stridor.   Gastrointestinal: Negative for vomiting or other signifcant bowel change Genitourinary: Negative for hematuria or decreased urine volume.  Musculoskeletal: Negative for other MSK pain or swelling Skin: Negative for color change and worsening wound.  Neurological: Negative for tremors and numbness other  than noted  Psychiatric/Behavioral: Negative for decreased concentration or agitation other than above       Objective:   Physical Exam BP 108/76  Pulse 82  Temp(Src) 98.2 F (36.8 C) (Oral)  Wt 281 lb 4 oz (127.574 kg)  SpO2 94% VS noted,  Constitutional: Pt appears well-developed, well-nourished. [name] [name], on home o2 HENT: Head: NCAT.  Right Ear: External ear normal.  Left Ear: External ear normal.  Eyes: . Pupils are equal, round, and reactive to light. Conjunctivae and EOM are normal Neck: Normal range of motion. Neck supple.  Cardiovascular: Normal rate and regular rhythm.   Pulmonary/Chest: Effort normal and breath sounds decreased bilat.  Abd:  Soft, NT, ND, + BS Neurological: Pt is alert. Not confused , motor grossly intact Skin: Skin is warm. No rash Psychiatric: Pt behavior is normal. No agitation.     Assessment & Plan:

## 2014-04-11 NOTE — Telephone Encounter (Signed)
Forward to [employer] MATHIS RN

## 2014-04-11 NOTE — Telephone Encounter (Signed)
Pt's wife was returning [name] [name]'s phone call. Please call  Thanks

## 2014-04-14 ENCOUNTER — Other Ambulatory Visit: Payer: Self-pay | Admitting: Cardiology

## 2014-04-14 NOTE — Assessment & Plan Note (Signed)
stable overall by history and exam, recent data reviewed with pt, and pt to continue medical treatment as before,  to f/u any worsening symptoms or concerns Lab Results  Component Value Date   HGBA1C 5.7 04/11/2014   For f/u a1c

## 2014-04-14 NOTE — Assessment & Plan Note (Signed)
Borderline low, cont to hold ace/arb due to lower BP BP Readings from Last 3 Encounters:  04/11/14 108/76  04/03/14 122/80  02/21/14 100/66

## 2014-04-14 NOTE — Assessment & Plan Note (Signed)
stable overall by history and exam, recent data reviewed with pt, and pt to continue medical treatment as before,  to f/u any worsening symptoms or concerns Lab Results  Component Value Date   LDLCALC 46 04/11/2014

## 2014-05-25 ENCOUNTER — Other Ambulatory Visit: Payer: Self-pay | Admitting: Cardiology

## 2014-05-25 NOTE — Telephone Encounter (Signed)
Rx was sent to pharmacy electronically. 

## 2014-05-28 NOTE — Progress Notes (Signed)
HPI: FU permanent AFib and right side CHF. He has failed DCCV in the past. Myoview 7/09: EF 54%, normal perfusion. Holter 09/2010: AFib with mildly decreased rate. He was admitted 12/2012 with acute right heart failure in the setting of significant pulmonary hypertension which is multifactorial including OSA, OHS and possible pulmonary venous hypertension. [place] 01/08/13 demonstrated elevated R heart pressures (RA 15, RV 44/11, PCWP 21, PA 41/30, CO 4.49, CI 1.8, PA sat 61%). He was transitioned from Coumadin to Apixaban 5 mg twice a day. Echo 5/14: Mild LVH, EF 55%, mod to severe LAE, mild RVE, mild reduced RVSF, mild RAE. Previously seen and noted to have significant hyperkalemia and renal insuff. Medications adjusted (ACEI and spironolactone DCed and lasix reduced); K and renal function improved. Since last seen, He has some dyspnea on exertion unchanged. He has gained 5 pounds. His pedal edema is unchanged. No chest pain or syncope.   Current Outpatient Prescriptions  Medication Sig Dispense Refill  . albuterol (PROVENTIL HFA;VENTOLIN HFA) 108 (90 BASE) MCG/ACT inhaler Inhale 2 puffs into the lungs every 6 (six) hours as needed for wheezing.  1 Inhaler  11  . allopurinol (ZYLOPRIM) 100 MG tablet TAKE 1 TABLET DAILY  90 tablet  3  . atorvastatin (LIPITOR) 20 MG tablet TAKE 1 TABLET DAILY  90 tablet  3  . cetirizine (ZYRTEC) 10 MG tablet Take 10 mg by mouth at bedtime.      Marland Kitchen diltiazem (CARDIZEM CD) 180 MG 24 hr capsule TAKE 1 CAPSULE DAILY (NEED APPOINTMENT)  90 capsule  1  . ELIQUIS 5 MG TABS tablet TAKE 1 TABLET TWICE A DAY  180 tablet  1  . furosemide (LASIX) 80 MG tablet Take 1 tablet (80 MG) IN THE MORNING AND 1/2 tablet (40 MG) IN THE AFTERNOON  45 tablet  1  . glimepiride (AMARYL) 1 MG tablet TAKE 1 TABLET IN THE MORNING AND ONE-HALF (1/2) TABLET IN THE EVENING  135 tablet  2  . metoprolol (LOPRESSOR) 50 MG tablet Take 1 tablet (50 mg total) by mouth 2 (two) times daily.  180 tablet  4    No current facility-administered medications for this visit.     Past Medical History  Diagnosis Date  . HYPERLIPIDEMIA   . GOUT   . Morbid obesity   . OBESITY HYPOVENTILATION SYNDROME     a. on home O2.  Marland Kitchen Unspecified hearing loss   . HYPERTENSION   . BRONCHITIS, CHRONIC 01/02/2009  . SKIN RASH 03/12/2010  . PROSTATE SPECIFIC ANTIGEN, ELEVATED 06/09/2007  . Long term (current) use of anticoagulants   . Complication of anesthesia     "aspiration pneumonia after hand OR" (01/05/2013)  . Permanent atrial fibrillation   . OSA (obstructive sleep apnea)   . DIABETES MELLITUS, TYPE II   . COMMON MIGRAINE     "none since treating for high BP"  . Arthritis     "knees and hands; thoracic area of the spine" (01/05/2013)  . On home oxygen therapy   . Acute right-sided CHF (congestive heart failure)     a. 01/2013.  . Pulmonary HTN     a. multifactorial including obstructive sleep apnea, obesity hypoventilation syndrome and possible pulmonary venous hypertension.    Past Surgical History  Procedure Laterality Date  . Appendectomy  1974  . Tendon repair Right 2009    "lacerated his tendon and pulley" Dr. Lenon Curt  . Cardioversion  05/21/2008; ~ 06/2008  . Inguinal hernia repair Right   .  Hernia repair    . Umbilical hernia repair      History   Social History  . Marital Status: Married    Spouse Name: N/A    Number of Children: 2  . Years of Education: N/A   Occupational History  . sheet metal Public librarian    Social History Main Topics  . Smoking status: Former Smoker -- 1.00 packs/day for 15 years    Types: Cigarettes    Quit date: 08/09/1978  . Smokeless tobacco: Never Used     Comment: 01/05/2013 "quit smoking ~ 40 years ago"  . Alcohol Use: Yes     Comment: 01/05/2013 "hasn't had a drink since 1980's; never had problem w/it"  . Drug Use: No  . Sexual Activity: Not Currently   Other Topics Concern  . Not on file   Social History Narrative  . No narrative on  file    ROS: no fevers or chills, productive cough, hemoptysis, dysphasia, odynophagia, melena, hematochezia, dysuria, hematuria, rash, seizure activity, orthopnea, PND, claudication. Remaining systems are negative.  Physical Exam: Well-developed obese in no acute distress.  Skin is warm and dry.  HEENT is normal.  Neck is supple.  Chest is clear to auscultation with normal expansion.  Cardiovascular exam is irregular Abdominal exam nontender or distended. No masses palpated. Extremities show chronic skin changes and 1+ edema. neuro grossly intact  ECG Atrial fibrillation at a rate of 83. Right axis deviation. No significant ST changes.

## 2014-05-30 ENCOUNTER — Encounter: Payer: Self-pay | Admitting: Cardiology

## 2014-05-30 ENCOUNTER — Ambulatory Visit (INDEPENDENT_AMBULATORY_CARE_PROVIDER_SITE_OTHER): Payer: Medicare Other | Admitting: Cardiology

## 2014-05-30 VITALS — BP 112/62 | HR 83 | Ht 68.0 in | Wt 285.8 lb

## 2014-05-30 DIAGNOSIS — I4819 Other persistent atrial fibrillation: Secondary | ICD-10-CM

## 2014-05-30 DIAGNOSIS — R0602 Shortness of breath: Secondary | ICD-10-CM

## 2014-05-30 DIAGNOSIS — I4891 Unspecified atrial fibrillation: Secondary | ICD-10-CM | POA: Diagnosis not present

## 2014-05-30 DIAGNOSIS — I481 Persistent atrial fibrillation: Secondary | ICD-10-CM

## 2014-05-30 LAB — BASIC METABOLIC PANEL WITH GFR
BUN: 16 mg/dL (ref 6–23)
CALCIUM: 8.9 mg/dL (ref 8.4–10.5)
CHLORIDE: 103 meq/L (ref 96–112)
CO2: 31 meq/L (ref 19–32)
CREATININE: 0.88 mg/dL (ref 0.50–1.35)
GFR, Est African American: >89 mL/min
GFR, Est Non African American: 88 mL/min
GLUCOSE: 106 mg/dL — AB (ref 70–99)
Potassium: 3.5 mEq/L (ref 3.5–5.3)
SODIUM: 143 meq/L (ref 135–145)

## 2014-05-30 MED ORDER — DILTIAZEM HCL ER COATED BEADS 180 MG PO CP24: 180.0000 mg | ORAL_CAPSULE | Freq: Every day | ORAL | Status: DC

## 2014-05-30 MED ORDER — FUROSEMIDE 80 MG PO TABS: 80.0000 mg | ORAL_TABLET | Freq: Two times a day (BID) | ORAL | Status: DC

## 2014-05-30 NOTE — Assessment & Plan Note (Signed)
Blood pressure controlled. Continue present medications. 

## 2014-05-30 NOTE — Assessment & Plan Note (Signed)
Heart rate is controlled. Continue Cardizem, metoprolol and apixaban.

## 2014-05-30 NOTE — Assessment & Plan Note (Signed)
Discussed weight loss. 

## 2014-05-30 NOTE — Assessment & Plan Note (Signed)
Continue statin. 

## 2014-05-30 NOTE — Patient Instructions (Signed)
Your physician recommends that you schedule a follow-up appointment in: [place] recommends that you HAVE LAB WORK TODAY  INCREASE FUROSEMIDE TO 57 MG TWICE DAILY  Your physician recommends that you return for lab work in: [place]

## 2014-05-30 NOTE — Assessment & Plan Note (Signed)
Related to obesity hypoventilation syndrome, obstructive sleep apnea and probable pulmonary venous hypertension. He is volume overloaded on examination. Check potassium, renal function and BNP. Increase Lasix to 80 mg twice a day. If potassium and renal function stable I will add spironolactone 25 mg daily. He will need repeat potassium and renal function in one week. Instructed on low sodium diet and fluid restriction. He also needs weight loss.

## 2014-05-31 LAB — BRAIN NATRIURETIC PEPTIDE: Brain Natriuretic Peptide: 109.7 pg/mL — ABNORMAL HIGH (ref 0.0–100.0)

## 2014-06-06 ENCOUNTER — Other Ambulatory Visit: Payer: Self-pay | Admitting: *Deleted

## 2014-06-06 ENCOUNTER — Telehealth: Payer: Self-pay | Admitting: Cardiology

## 2014-06-06 DIAGNOSIS — Z79899 Other long term (current) drug therapy: Secondary | ICD-10-CM

## 2014-06-06 LAB — BASIC METABOLIC PANEL
BUN: 19 mg/dL (ref 6–23)
CALCIUM: 8.6 mg/dL (ref 8.4–10.5)
CO2: 33 meq/L — AB (ref 19–32)
Chloride: 100 mEq/L (ref 96–112)
Creat: 1.04 mg/dL (ref 0.50–1.35)
GLUCOSE: 156 mg/dL — AB (ref 70–99)
Potassium: 3.6 mEq/L (ref 3.5–5.3)
SODIUM: 143 meq/L (ref 135–145)

## 2014-06-06 NOTE — Telephone Encounter (Signed)
Per Lynnda Child patient needs a B-MET. Order placed.

## 2014-06-06 NOTE — Telephone Encounter (Signed)
Katrina called in stating that lab orders for this pt need to be put in. Please call  Pt is in the office'

## 2014-07-18 ENCOUNTER — Encounter (HOSPITAL_COMMUNITY): Payer: Self-pay | Admitting: Cardiovascular Disease

## 2014-07-19 NOTE — Progress Notes (Signed)
HPI: FU permanent AFib and right side CHF. He has failed DCCV in the past. Myoview 7/09: EF 54%, normal perfusion. Holter 09/2010: AFib with mildly decreased rate. He was admitted 12/2012 with acute right heart failure in the setting of significant pulmonary hypertension which is multifactorial including OSA, OHS and possible pulmonary venous hypertension. [place] 01/08/13 demonstrated elevated R heart pressures (RA 15, RV 44/11, PCWP 21, PA 41/30, CO 4.49, CI 1.8, PA sat 61%). He was transitioned from Coumadin to Apixaban 5 mg twice a day. Echo 5/14: Mild LVH, EF 55%, mod to severe LAE, mild RVE, mild reduced RVSF, mild RAE. Patient had some degree of renal insufficiency and hyperkalemia on a combination of ACE inhibitor and spironolactone. Medications adjusted (ACEI and spironolactone DCed and lasix reduced); K and renal function improved. Spironolactone resumed. Since last seen he has some dyspnea on exertion but unchanged. He has chronic lower extremity edema which has improved. He denies chest pain, palpitations or syncope.  Current Outpatient Prescriptions  Medication Sig Dispense Refill  . albuterol (PROVENTIL HFA;VENTOLIN HFA) 108 (90 BASE) MCG/ACT inhaler Inhale 2 puffs into the lungs every 6 (six) hours as needed for wheezing. 1 Inhaler 11  . allopurinol (ZYLOPRIM) 100 MG tablet TAKE 1 TABLET DAILY 90 tablet 3  . atorvastatin (LIPITOR) 20 MG tablet TAKE 1 TABLET DAILY 90 tablet 3  . cetirizine (ZYRTEC) 10 MG tablet Take 10 mg by mouth at bedtime.    Marland Kitchen diltiazem (CARDIZEM CD) 180 MG 24 hr capsule Take 1 capsule (180 mg total) by mouth daily. 90 capsule 3  . ELIQUIS 5 MG TABS tablet TAKE 1 TABLET TWICE A DAY 180 tablet 1  . furosemide (LASIX) 80 MG tablet Take 1 tablet (80 mg total) by mouth 2 (two) times daily. 45 tablet 1  . glimepiride (AMARYL) 1 MG tablet TAKE 1 TABLET IN THE MORNING AND ONE-HALF (1/2) TABLET IN THE EVENING 135 tablet 2  . metoprolol (LOPRESSOR) 50 MG tablet Take 1 tablet  (50 mg total) by mouth 2 (two) times daily. 180 tablet 4   No current facility-administered medications for this visit.     Past Medical History  Diagnosis Date  . HYPERLIPIDEMIA   . GOUT   . Morbid obesity   . OBESITY HYPOVENTILATION SYNDROME     a. on home O2.  Marland Kitchen Unspecified hearing loss   . HYPERTENSION   . BRONCHITIS, CHRONIC 01/02/2009  . SKIN RASH 03/12/2010  . PROSTATE SPECIFIC ANTIGEN, ELEVATED 06/09/2007  . Long term (current) use of anticoagulants   . Complication of anesthesia     "aspiration pneumonia after hand OR" (01/05/2013)  . Permanent atrial fibrillation   . OSA (obstructive sleep apnea)   . DIABETES MELLITUS, TYPE II   . COMMON MIGRAINE     "none since treating for high BP"  . Arthritis     "knees and hands; thoracic area of the spine" (01/05/2013)  . On home oxygen therapy   . Acute right-sided CHF (congestive heart failure)     a. 01/2013.  . Pulmonary HTN     a. multifactorial including obstructive sleep apnea, obesity hypoventilation syndrome and possible pulmonary venous hypertension.    Past Surgical History  Procedure Laterality Date  . Appendectomy  1974  . Tendon repair Right 2009    "lacerated his tendon and pulley" Dr. Lenon Curt  . Cardioversion  05/21/2008; ~ 06/2008  . Inguinal hernia repair Right   . Hernia repair    . Umbilical  hernia repair    . Right heart catheterization N/A 01/08/2013    Procedure: RIGHT HEART CATH;  Surgeon: Thayer Headings, MD;  Location: [facility] CATH LAB;  Service: Cardiovascular;  Laterality: N/A;    History   Social History  . Marital Status: Married    Spouse Name: N/A    Number of Children: 2  . Years of Education: N/A   Occupational History  . sheet metal Public librarian    Social History Main Topics  . Smoking status: Former Smoker -- 1.00 packs/day for 15 years    Types: Cigarettes    Quit date: 08/09/1978  . Smokeless tobacco: Never Used     Comment: 01/05/2013 "quit smoking ~ 40 years ago"  . Alcohol  Use: Yes     Comment: 01/05/2013 "hasn't had a drink since 1980's; never had problem w/it"  . Drug Use: No  . Sexual Activity: Not Currently   Other Topics Concern  . Not on file   Social History Narrative    ROS: no fevers or chills, productive cough, hemoptysis, dysphasia, odynophagia, melena, hematochezia, dysuria, hematuria, rash, seizure activity, orthopnea, PND, claudication. Remaining systems are negative.  Physical Exam: Well-developed obese in no acute distress.  Skin is warm and dry.  HEENT is normal.  Neck is supple.  Chest is clear to auscultation with normal expansion.  Cardiovascular exam is irregular Abdominal exam nontender or distended. No masses palpated. Extremities show 1+ edema; chronic skin changes. neuro grossly intact

## 2014-07-22 ENCOUNTER — Encounter: Payer: Self-pay | Admitting: Cardiology

## 2014-07-22 ENCOUNTER — Ambulatory Visit (INDEPENDENT_AMBULATORY_CARE_PROVIDER_SITE_OTHER): Payer: Medicare Other | Admitting: Cardiology

## 2014-07-22 VITALS — BP 118/72 | HR 82 | Ht 67.5 in | Wt 286.1 lb

## 2014-07-22 DIAGNOSIS — I509 Heart failure, unspecified: Secondary | ICD-10-CM

## 2014-07-22 DIAGNOSIS — R0609 Other forms of dyspnea: Secondary | ICD-10-CM

## 2014-07-22 DIAGNOSIS — I50811 Acute right heart failure: Secondary | ICD-10-CM

## 2014-07-22 LAB — CBC
HEMATOCRIT: 45.4 % (ref 39.0–52.0)
Hemoglobin: 15.1 g/dL (ref 13.0–17.0)
MCH: 27.8 pg (ref 26.0–34.0)
MCHC: 33.3 g/dL (ref 30.0–36.0)
MCV: 83.5 fL (ref 78.0–100.0)
MPV: 9.9 fL (ref 9.4–12.4)
Platelets: 280 10*3/uL (ref 150–400)
RBC: 5.44 MIL/uL (ref 4.22–5.81)
RDW: 15.5 % (ref 11.5–15.5)
WBC: 10.6 10*3/uL — ABNORMAL HIGH (ref 4.0–10.5)

## 2014-07-22 LAB — BASIC METABOLIC PANEL WITH GFR
BUN: 15 mg/dL (ref 6–23)
CALCIUM: 9.2 mg/dL (ref 8.4–10.5)
CO2: 32 mEq/L (ref 19–32)
Chloride: 102 mEq/L (ref 96–112)
Creat: 1 mg/dL (ref 0.50–1.35)
GFR, Est African American: 89 mL/min
GFR, Est Non African American: 77 mL/min
GLUCOSE: 138 mg/dL — AB (ref 70–99)
Potassium: 3.4 mEq/L — ABNORMAL LOW (ref 3.5–5.3)
Sodium: 145 mEq/L (ref 135–145)

## 2014-07-22 NOTE — Assessment & Plan Note (Signed)
Continue statin. 

## 2014-07-22 NOTE — Patient Instructions (Signed)
Your physician recommends that you schedule a follow-up appointment in: 3 MONTHS WITH DR CRENSHAW  Your physician recommends that you HAVE LAB WORK TODAY  

## 2014-07-22 NOTE — Assessment & Plan Note (Signed)
Most likely related to obesity hypoventilation syndrome, sleep apnea and pulmonary venous hypertension. Volume status appears stable. Continue present dose of Lasix. Patient instructed on low sodium diet and fluid restriction. Check potassium, renal function and BNP. Increase diuretics if needed.

## 2014-07-22 NOTE — Assessment & Plan Note (Signed)
We discussed weight loss. 

## 2014-07-22 NOTE — Assessment & Plan Note (Signed)
Blood pressure controlled. Continue present medications. 

## 2014-07-22 NOTE — Assessment & Plan Note (Signed)
Continue metoprolol and Cardizem for rate control. Continue apixaban. Check hemoglobin and renal function.

## 2014-07-23 LAB — BRAIN NATRIURETIC PEPTIDE: BRAIN NATRIURETIC PEPTIDE: 103.7 pg/mL — AB (ref 0.0–100.0)

## 2014-07-24 ENCOUNTER — Other Ambulatory Visit: Payer: Self-pay | Admitting: *Deleted

## 2014-07-24 DIAGNOSIS — I4819 Other persistent atrial fibrillation: Secondary | ICD-10-CM

## 2014-07-24 DIAGNOSIS — E876 Hypokalemia: Secondary | ICD-10-CM

## 2014-07-24 DIAGNOSIS — R0602 Shortness of breath: Secondary | ICD-10-CM

## 2014-07-24 MED ORDER — FUROSEMIDE 80 MG PO TABS: 120.0000 mg | ORAL_TABLET | Freq: Two times a day (BID) | ORAL | Status: DC

## 2014-07-24 MED ORDER — SPIRONOLACTONE 25 MG PO TABS: 25.0000 mg | ORAL_TABLET | Freq: Every day | ORAL | Status: DC

## 2014-07-24 MED ORDER — POTASSIUM CHLORIDE CRYS ER 20 MEQ PO TBCR: 20.0000 meq | EXTENDED_RELEASE_TABLET | Freq: Two times a day (BID) | ORAL | Status: DC

## 2014-07-31 DIAGNOSIS — E876 Hypokalemia: Secondary | ICD-10-CM | POA: Diagnosis not present

## 2014-08-01 LAB — BASIC METABOLIC PANEL WITH GFR
BUN: 25 mg/dL — AB (ref 6–23)
CHLORIDE: 101 meq/L (ref 96–112)
CO2: 35 mEq/L — ABNORMAL HIGH (ref 19–32)
Calcium: 9.6 mg/dL (ref 8.4–10.5)
Creat: 1.12 mg/dL (ref 0.50–1.35)
GFR, EST AFRICAN AMERICAN: 78 mL/min
GFR, Est Non African American: 67 mL/min
GLUCOSE: 139 mg/dL — AB (ref 70–99)
POTASSIUM: 4.5 meq/L (ref 3.5–5.3)
SODIUM: 143 meq/L (ref 135–145)

## 2014-08-22 ENCOUNTER — Other Ambulatory Visit: Payer: Self-pay

## 2014-08-22 DIAGNOSIS — E876 Hypokalemia: Secondary | ICD-10-CM

## 2014-08-22 MED ORDER — POTASSIUM CHLORIDE CRYS ER 20 MEQ PO TBCR
20.0000 meq | EXTENDED_RELEASE_TABLET | Freq: Two times a day (BID) | ORAL | Status: DC
Start: 2014-08-22 — End: 2014-08-29

## 2014-08-29 ENCOUNTER — Other Ambulatory Visit: Payer: Self-pay

## 2014-08-29 DIAGNOSIS — E876 Hypokalemia: Secondary | ICD-10-CM

## 2014-08-29 MED ORDER — POTASSIUM CHLORIDE CRYS ER 20 MEQ PO TBCR
20.0000 meq | EXTENDED_RELEASE_TABLET | Freq: Two times a day (BID) | ORAL | Status: DC
Start: 2014-08-29 — End: 2014-08-29

## 2014-08-29 MED ORDER — POTASSIUM CHLORIDE CRYS ER 20 MEQ PO TBCR: 20.0000 meq | EXTENDED_RELEASE_TABLET | Freq: Two times a day (BID) | ORAL | Status: DC

## 2014-09-11 ENCOUNTER — Other Ambulatory Visit: Payer: Self-pay | Admitting: Cardiology

## 2014-09-11 NOTE — Telephone Encounter (Signed)
Rx has been sent to the pharmacy electronically. ° °

## 2014-10-07 ENCOUNTER — Ambulatory Visit: Payer: Self-pay | Admitting: Cardiology

## 2014-10-07 DIAGNOSIS — I4891 Unspecified atrial fibrillation: Secondary | ICD-10-CM

## 2014-10-10 ENCOUNTER — Encounter: Payer: Self-pay | Admitting: Internal Medicine

## 2014-10-10 ENCOUNTER — Other Ambulatory Visit (INDEPENDENT_AMBULATORY_CARE_PROVIDER_SITE_OTHER): Payer: Medicare Other

## 2014-10-10 ENCOUNTER — Ambulatory Visit (INDEPENDENT_AMBULATORY_CARE_PROVIDER_SITE_OTHER): Payer: Medicare Other | Admitting: Internal Medicine

## 2014-10-10 VITALS — BP 140/80 | HR 76 | Temp 97.8°F | Ht 68.0 in | Wt 275.0 lb

## 2014-10-10 DIAGNOSIS — E785 Hyperlipidemia, unspecified: Secondary | ICD-10-CM

## 2014-10-10 DIAGNOSIS — E119 Type 2 diabetes mellitus without complications: Secondary | ICD-10-CM

## 2014-10-10 DIAGNOSIS — I10 Essential (primary) hypertension: Secondary | ICD-10-CM | POA: Diagnosis not present

## 2014-10-10 LAB — HEPATIC FUNCTION PANEL
ALT: 15 U/L (ref 0–53)
AST: 14 U/L (ref 0–37)
Albumin: 4 g/dL (ref 3.5–5.2)
Alkaline Phosphatase: 82 U/L (ref 39–117)
Bilirubin, Direct: 0.2 mg/dL (ref 0.0–0.3)
TOTAL PROTEIN: 8 g/dL (ref 6.0–8.3)
Total Bilirubin: 1 mg/dL (ref 0.2–1.2)

## 2014-10-10 LAB — BASIC METABOLIC PANEL
BUN: 43 mg/dL — AB (ref 6–23)
CHLORIDE: 97 meq/L (ref 96–112)
CO2: 34 mEq/L — ABNORMAL HIGH (ref 19–32)
Calcium: 9.7 mg/dL (ref 8.4–10.5)
Creatinine, Ser: 1.59 mg/dL — ABNORMAL HIGH (ref 0.40–1.50)
GFR: 46.13 mL/min — ABNORMAL LOW (ref >60.00)
Glucose, Bld: 160 mg/dL — ABNORMAL HIGH (ref 70–99)
POTASSIUM: 5 meq/L (ref 3.5–5.1)
SODIUM: 135 meq/L (ref 135–145)

## 2014-10-10 LAB — LIPID PANEL
CHOLESTEROL: 116 mg/dL (ref 0–200)
HDL: 35.6 mg/dL — ABNORMAL LOW (ref >39.00)
LDL Cholesterol: 44 mg/dL (ref 0–99)
NonHDL: 80.4
Total CHOL/HDL Ratio: 3
Triglycerides: 182 mg/dL — ABNORMAL HIGH (ref 0.0–149.0)
VLDL: 36.4 mg/dL (ref 0.0–40.0)

## 2014-10-10 LAB — HEMOGLOBIN A1C: Hgb A1c MFr Bld: 7.2 % — ABNORMAL HIGH (ref 4.6–6.5)

## 2014-10-10 MED ORDER — GLIMEPIRIDE 1 MG PO TABS: ORAL_TABLET | ORAL | Status: DC

## 2014-10-10 NOTE — Assessment & Plan Note (Signed)
stable overall by history and exam, recent data reviewed with pt, and pt to continue medical treatment as before,  to f/u any worsening symptoms or concerns Lab Results  Component Value Date   LDLCALC 46 04/11/2014

## 2014-10-10 NOTE — Patient Instructions (Addendum)
OK to decrease the glimeparide to 0.5 mg in the AM only  Please continue all other medications as before, and refills have been done if requested.  Please have the pharmacy call with any other refills you may need.  Please continue your efforts at being more active, low cholesterol diet, and weight control.  You are otherwise up to date with prevention measures today.  Please keep your appointments with your specialists as you may have planned - Dr [name] on Mar 15  You will be contacted regarding the referral for: GI for consideration for screening colonoscoipy  Please return in 6 months, or sooner if needed

## 2014-10-10 NOTE — Assessment & Plan Note (Signed)
stable overall by history and exam, recent data reviewed with pt, and pt to continue medical treatment as before,  to f/u any worsening symptoms or concerns BP Readings from Last 3 Encounters:  10/10/14 140/80  07/22/14 118/72  05/30/14 112/62

## 2014-10-10 NOTE — Progress Notes (Signed)
Subjective:    Patient ID: [name] [name], male    DOB: 31-Jan-1946, 69 y.o.   MRN: [id number]  HPI    Here to f/u; overall doing ok,  Pt denies chest pain, increased sob or doe, wheezing, orthopnea, PND, increased LE swelling, palpitations, dizziness or syncope.  Pt denies polydipsia, polyuria, or low sugar symptoms such as weakness or confusion improved with po intake.  Pt denies new neurological symptoms such as new headache, or facial or extremity weakness or numbness.   Pt states overall good compliance with meds, has been trying to follow lower cholesterol, diabetic diet, with wt overall stable,  but little exercise however, though has tried to be more active, much better diet, and hast lost 10 lbs intentionally  Wt Readings from Last 3 Encounters:  10/10/14 275 lb (124.739 kg)  07/22/14 286 lb 1.6 oz (129.774 kg)  05/30/14 285 lb 12.8 oz (129.638 kg)   Past Medical History  Diagnosis Date  . HYPERLIPIDEMIA   . GOUT   . Morbid obesity   . OBESITY HYPOVENTILATION SYNDROME     a. on home O2.  Marland Kitchen Unspecified hearing loss   . HYPERTENSION   . BRONCHITIS, CHRONIC 01/02/2009  . SKIN RASH 03/12/2010  . PROSTATE SPECIFIC ANTIGEN, ELEVATED 06/09/2007  . Long term (current) use of anticoagulants   . Complication of anesthesia     "aspiration pneumonia after hand OR" (01/05/2013)  . Permanent atrial fibrillation   . OSA (obstructive sleep apnea)   . DIABETES MELLITUS, TYPE II   . COMMON MIGRAINE     "none since treating for high BP"  . Arthritis     "knees and hands; thoracic area of the spine" (01/05/2013)  . On home oxygen therapy   . Acute right-sided CHF (congestive heart failure)     a. 01/2013.  . Pulmonary HTN     a. multifactorial including obstructive sleep apnea, obesity hypoventilation syndrome and possible pulmonary venous hypertension.   Past Surgical History  Procedure Laterality Date  . Appendectomy  1974  . Tendon repair Right 2009    "lacerated his tendon and pulley" Dr.  Lenon Curt  . Cardioversion  05/21/2008; ~ 06/2008  . Inguinal hernia repair Right   . Hernia repair    . Umbilical hernia repair    . Right heart catheterization N/A 01/08/2013    Procedure: RIGHT HEART CATH;  Surgeon: Thayer Headings, MD;  Location: [facility] CATH LAB;  Service: Cardiovascular;  Laterality: N/A;    reports that he quit smoking about 36 years ago. His smoking use included Cigarettes. He has a 15 pack-year smoking history. He has never used smokeless tobacco. He reports that he drinks alcohol. He reports that he does not use illicit drugs. family history includes Alzheimer's disease in his father; Cancer in his father and other; Coronary artery disease in his other. Allergies  Allergen Reactions  . Codeine     MAKES HIM GOOFY  . Heparin     ? Abdominal rash 01/2013   Current Outpatient Prescriptions on File Prior to Visit  Medication Sig Dispense Refill  . albuterol (PROVENTIL HFA;VENTOLIN HFA) 108 (90 BASE) MCG/ACT inhaler Inhale 2 puffs into the lungs every 6 (six) hours as needed for wheezing. 1 Inhaler 11  . allopurinol (ZYLOPRIM) 100 MG tablet TAKE 1 TABLET DAILY 90 tablet 3  . atorvastatin (LIPITOR) 20 MG tablet TAKE 1 TABLET DAILY 90 tablet 3  . cetirizine (ZYRTEC) 10 MG tablet Take 10 mg by mouth at  bedtime.    Marland Kitchen diltiazem (CARDIZEM CD) 180 MG 24 hr capsule Take 1 capsule (180 mg total) by mouth daily. 90 capsule 3  . ELIQUIS 5 MG TABS tablet TAKE 1 TABLET TWICE A DAY 180 tablet 1  . furosemide (LASIX) 80 MG tablet Take 1.5 tablets (120 mg total) by mouth 2 (two) times daily. 45 tablet 1  . metoprolol (LOPRESSOR) 50 MG tablet Take 1 tablet (50 mg total) by mouth 2 (two) times daily. 180 tablet 4  . potassium chloride SA (K-DUR,KLOR-CON) 20 MEQ tablet Take 1 tablet (20 mEq total) by mouth 2 (two) times daily. 180 tablet 3  . spironolactone (ALDACTONE) 25 MG tablet Take 1 tablet (25 mg total) by mouth daily. 90 tablet 3   No current facility-administered medications on file  prior to visit.     Review of Systems  Constitutional: Negative for unusual diaphoresis or other sweats  HENT: Negative for ringing in ear Eyes: Negative for double vision or worsening visual disturbance.  Respiratory: Negative for choking and stridor.   Gastrointestinal: Negative for vomiting or other signifcant bowel change Genitourinary: Negative for hematuria or decreased urine volume.  Musculoskeletal: Negative for other MSK pain or swelling Skin: Negative for color change and worsening wound.  Neurological: Negative for tremors and numbness other than noted  Psychiatric/Behavioral: Negative for decreased concentration or agitation other than above       Objective:   Physical Exam BP 140/80 mmHg  Pulse 76  Temp(Src) 97.8 F (36.6 C) (Oral)  Ht 5\' 8"  (1.727 m)  Wt 275 lb (124.739 kg)  BMI 41.82 kg/m2  SpO2 99%. On home o2 3L with 4L at night VS noted,  Constitutional: Pt appears well-developed, well-nourished.  HENT: Head: NCAT.  Right Ear: External ear normal.  Left Ear: External ear normal.  Eyes: . Pupils are equal, round, and reactive to light. Conjunctivae and EOM are normal Neck: Normal range of motion. Neck supple.  Cardiovascular: Normal rate and regular rhythm.   Pulmonary/Chest: Effort normal and breath sounds decresaed without rales or wheezing.  Abd:  Soft, NT, ND, + BS Neurological: Pt is alert. Not confused , motor grossly intact Skin: Skin is warm. No rash Psychiatric: Pt behavior is normal. No agitation.      Assessment & Plan:

## 2014-10-10 NOTE — Assessment & Plan Note (Signed)
Had normal a1c last visit, stable lifestyle, and now 10 lbs wt loss - for f/u labs but ok to decrease the glimeparide to .5 mg in the am only

## 2014-10-10 NOTE — Addendum Note (Signed)
Addended by: [name] on: 10/10/2014 12:31 PM   Modules accepted: Orders

## 2014-10-10 NOTE — Progress Notes (Signed)
Pre visit review using our clinic review tool, if applicable. No additional management support is needed unless otherwise documented below in the visit note. 

## 2014-10-15 ENCOUNTER — Other Ambulatory Visit: Payer: Self-pay | Admitting: Cardiology

## 2014-10-21 NOTE — Progress Notes (Signed)
HPI: FU permanent AFib and right side CHF. He has failed DCCV in the past. Myoview 7/09: EF 54%, normal perfusion. Holter 09/2010: AFib with mildly decreased rate. He was admitted 12/2012 with acute right heart failure in the setting of significant pulmonary hypertension which is multifactorial including OSA, OHS and possible pulmonary venous hypertension. [place] 01/08/13 demonstrated elevated R heart pressures (RA 15, RV 44/11, PCWP 21, PA 41/30, CO 4.49, CI 1.8, PA sat 61%). He was transitioned from Coumadin to Apixaban 5 mg twice a day. Echo 5/14: Mild LVH, EF 55%, mod to severe LAE, mild RVE, mild reduced RVSF, mild RAE. Patient had some degree of renal insufficiency and hyperkalemia on a combination of ACE inhibitor and spironolactone. Medications adjusted (ACEI and spironolactone DCed and lasix reduced); K and renal function improved. Spironolactone resumed. Since last seen he has mild dyspnea on exertion but no orthopnea or PND. Pedal edema is chronic and unchanged. No chest pain or syncope.  Current Outpatient Prescriptions  Medication Sig Dispense Refill  . albuterol (PROVENTIL HFA;VENTOLIN HFA) 108 (90 BASE) MCG/ACT inhaler Inhale 2 puffs into the lungs every 6 (six) hours as needed for wheezing. 1 Inhaler 11  . allopurinol (ZYLOPRIM) 100 MG tablet TAKE 1 TABLET DAILY 90 tablet 3  . atorvastatin (LIPITOR) 20 MG tablet TAKE 1 TABLET DAILY 90 tablet 3  . cetirizine (ZYRTEC) 10 MG tablet Take 10 mg by mouth at bedtime.    Marland Kitchen diltiazem (CARDIZEM CD) 180 MG 24 hr capsule Take 1 capsule (180 mg total) by mouth daily. 90 capsule 3  . ELIQUIS 5 MG TABS tablet TAKE 1 TABLET TWICE A DAY 180 tablet 1  . furosemide (LASIX) 80 MG tablet Take 1.5 tablets (120 mg total) by mouth 2 (two) times daily. 45 tablet 1  . glimepiride (AMARYL) 1 MG tablet TAKE 1 TABLET IN THE MORNING by mouth, then 1/2 tab in the PM 4535 tablet 3  . metoprolol (LOPRESSOR) 50 MG tablet Take 1 tablet (50 mg total) by mouth 2 (two)  times daily. 180 tablet 4  . potassium chloride SA (K-DUR,KLOR-CON) 20 MEQ tablet Take 1 tablet (20 mEq total) by mouth 2 (two) times daily. 180 tablet 3  . spironolactone (ALDACTONE) 25 MG tablet Take 1 tablet (25 mg total) by mouth daily. 90 tablet 3   No current facility-administered medications for this visit.     Past Medical History  Diagnosis Date  . HYPERLIPIDEMIA   . GOUT   . Morbid obesity   . OBESITY HYPOVENTILATION SYNDROME     a. on home O2.  Marland Kitchen Unspecified hearing loss   . HYPERTENSION   . BRONCHITIS, CHRONIC 01/02/2009  . SKIN RASH 03/12/2010  . PROSTATE SPECIFIC ANTIGEN, ELEVATED 06/09/2007  . Long term (current) use of anticoagulants   . Complication of anesthesia     "aspiration pneumonia after hand OR" (01/05/2013)  . Permanent atrial fibrillation   . OSA (obstructive sleep apnea)   . DIABETES MELLITUS, TYPE II   . COMMON MIGRAINE     "none since treating for high BP"  . Arthritis     "knees and hands; thoracic area of the spine" (01/05/2013)  . On home oxygen therapy   . Acute right-sided CHF (congestive heart failure)     a. 01/2013.  . Pulmonary HTN     a. multifactorial including obstructive sleep apnea, obesity hypoventilation syndrome and possible pulmonary venous hypertension.    Past Surgical History  Procedure Laterality Date  .  Appendectomy  1974  . Tendon repair Right 2009    "lacerated his tendon and pulley" Dr. Lenon Curt  . Cardioversion  05/21/2008; ~ 06/2008  . Inguinal hernia repair Right   . Hernia repair    . Umbilical hernia repair    . Right heart catheterization N/A 01/08/2013    Procedure: RIGHT HEART CATH;  Surgeon: Thayer Headings, MD;  Location: [facility] CATH LAB;  Service: Cardiovascular;  Laterality: N/A;    History   Social History  . Marital Status: Married    Spouse Name: N/A  . Number of Children: 2  . Years of Education: N/A   Occupational History  . sheet metal Public librarian    Social History Main Topics  . Smoking  status: Former Smoker -- 1.00 packs/day for 15 years    Types: Cigarettes    Quit date: 08/09/1978  . Smokeless tobacco: Never Used     Comment: 01/05/2013 "quit smoking ~ 40 years ago"  . Alcohol Use: 0.0 oz/week    0 Standard drinks or equivalent per week     Comment: 01/05/2013 "hasn't had a drink since 1980's; never had problem w/it"  . Drug Use: No  . Sexual Activity: Not Currently   Other Topics Concern  . Not on file   Social History Narrative    ROS: no fevers or chills, productive cough, hemoptysis, dysphasia, odynophagia, melena, hematochezia, dysuria, hematuria, rash, seizure activity, orthopnea, PND, claudication. Remaining systems are negative.  Physical Exam: Well-developed obese in no acute distress.  Skin is warm and dry.  HEENT is normal.  Neck is supple.  Chest is clear to auscultation with normal expansion.  Cardiovascular exam is irregular Abdominal exam nontender or distended. No masses palpated. Extremities show 1+ edema and chronic skin changes. neuro grossly intact

## 2014-10-22 ENCOUNTER — Encounter: Payer: Self-pay | Admitting: Cardiology

## 2014-10-22 ENCOUNTER — Ambulatory Visit (INDEPENDENT_AMBULATORY_CARE_PROVIDER_SITE_OTHER): Payer: Medicare Other | Admitting: Cardiology

## 2014-10-22 VITALS — BP 124/66 | HR 64 | Ht 68.0 in | Wt 275.0 lb

## 2014-10-22 DIAGNOSIS — I2781 Cor pulmonale (chronic): Secondary | ICD-10-CM | POA: Diagnosis not present

## 2014-10-22 DIAGNOSIS — E785 Hyperlipidemia, unspecified: Secondary | ICD-10-CM

## 2014-10-22 DIAGNOSIS — I482 Chronic atrial fibrillation, unspecified: Secondary | ICD-10-CM

## 2014-10-22 DIAGNOSIS — I10 Essential (primary) hypertension: Secondary | ICD-10-CM | POA: Diagnosis not present

## 2014-10-22 LAB — BASIC METABOLIC PANEL WITH GFR
BUN: 38 mg/dL — AB (ref 6–23)
CHLORIDE: 100 meq/L (ref 96–112)
CO2: 28 meq/L (ref 19–32)
Calcium: 9.1 mg/dL (ref 8.4–10.5)
Creat: 1.39 mg/dL — ABNORMAL HIGH (ref 0.50–1.35)
GFR, EST NON AFRICAN AMERICAN: 52 mL/min — AB
GFR, Est African American: 60 mL/min
Glucose, Bld: 104 mg/dL — ABNORMAL HIGH (ref 70–99)
POTASSIUM: 4.6 meq/L (ref 3.5–5.3)
Sodium: 139 mEq/L (ref 135–145)

## 2014-10-22 NOTE — Assessment & Plan Note (Signed)
Discussed weight loss. 

## 2014-10-22 NOTE — Assessment & Plan Note (Signed)
Blood pressure controlled. Continue present medications. 

## 2014-10-22 NOTE — Patient Instructions (Signed)
Your physician recommends that you schedule a follow-up appointment in: 3 MONTHS WITH DR CRENSHAW  Your physician recommends that you HAVE LAB WORK TODAY  

## 2014-10-22 NOTE — Assessment & Plan Note (Signed)
Most likely related to obesity hypoventilation syndrome, sleep apnea and pulmonary venous hypertension. Volume status appears stable. Continue present dose of Lasix. Patient instructed on low sodium diet and fluid restriction. Check potassium, renal function and BNP. Recent renal function was worse. We may need to back off on diuretics.

## 2014-10-22 NOTE — Assessment & Plan Note (Signed)
Continue statin. 

## 2014-10-22 NOTE — Assessment & Plan Note (Signed)
Continue beta blocker and calcium blocker for rate control. Continue apixaban.

## 2014-12-18 ENCOUNTER — Other Ambulatory Visit: Payer: Self-pay | Admitting: Internal Medicine

## 2014-12-19 ENCOUNTER — Encounter: Payer: Self-pay | Admitting: Cardiology

## 2015-01-14 ENCOUNTER — Ambulatory Visit: Payer: Medicare Other | Admitting: Cardiology

## 2015-01-31 ENCOUNTER — Other Ambulatory Visit: Payer: Self-pay | Admitting: *Deleted

## 2015-01-31 ENCOUNTER — Telehealth: Payer: Self-pay | Admitting: Cardiology

## 2015-01-31 DIAGNOSIS — R0602 Shortness of breath: Secondary | ICD-10-CM

## 2015-01-31 DIAGNOSIS — I4819 Other persistent atrial fibrillation: Secondary | ICD-10-CM

## 2015-01-31 MED ORDER — FUROSEMIDE 80 MG PO TABS: 120.0000 mg | ORAL_TABLET | Freq: Two times a day (BID) | ORAL | Status: DC

## 2015-01-31 NOTE — Telephone Encounter (Signed)
°  1. Which medications need to be refilled? Furosemide-need new prescription for new dosage 2.Which pharmacy is medication to be sent to?[facility]- 3. Do they need a 30 day or 90 day supply? #180 for 3 months and refills  4. Would they like a call back once the medication has been sent to the pharmacy? yes

## 2015-02-03 ENCOUNTER — Other Ambulatory Visit: Payer: Self-pay

## 2015-02-12 ENCOUNTER — Telehealth: Payer: Self-pay | Admitting: Cardiology

## 2015-02-12 NOTE — Telephone Encounter (Signed)
Check bmet Fu with primary care for other issues [name] [name]

## 2015-02-12 NOTE — Telephone Encounter (Signed)
Returned call to patient's wife.She stated her husband has same symptoms he had when his potassium was elevated.She has noticed his personally has changed.He is more irritable,just not acting like his self.Has noticed these changes for the past 2 weeks.Also he has noticed a red raised area on back of right lower leg about the size of a quarter for the past 2 days.Area is slightly painful, cool to touch.Advised call PCP about raised area on leg,will send message to [facility] for advice about personally change.

## 2015-02-12 NOTE — Telephone Encounter (Signed)
Pt's wife called in wanting to speak with Hilda Blades about the pt's recent behavior. She says that he is slow to respond when asked questions and has moments of gazing off ..confusion. Please f/u with her   Thanks

## 2015-02-14 ENCOUNTER — Ambulatory Visit (INDEPENDENT_AMBULATORY_CARE_PROVIDER_SITE_OTHER): Payer: Medicare Other | Admitting: Family

## 2015-02-14 ENCOUNTER — Encounter: Payer: Self-pay | Admitting: Family

## 2015-02-14 VITALS — BP 112/70 | HR 89 | Temp 99.1°F | Resp 18 | Ht 68.0 in | Wt 276.4 lb

## 2015-02-14 DIAGNOSIS — L03115 Cellulitis of right lower limb: Secondary | ICD-10-CM | POA: Diagnosis not present

## 2015-02-14 MED ORDER — SULFAMETHOXAZOLE-TRIMETHOPRIM 800-160 MG PO TABS: 1.0000 | ORAL_TABLET | Freq: Two times a day (BID) | ORAL | Status: DC

## 2015-02-14 NOTE — Progress Notes (Signed)
Pre visit review using our clinic review tool, if applicable. No additional management support is needed unless otherwise documented below in the visit note. 

## 2015-02-14 NOTE — Patient Instructions (Addendum)
Thank you for choosing [facility].  Summary/Instructions:  Your prescription(s) have been submitted to your pharmacy or been printed and provided for you. Please take as directed and contact our office if you believe you are having problem(s) with the medication(s) or have any questions.   If your symptoms worsen or fail to improve, please contact our office for further instruction, or in case of emergency go directly to the emergency room at the closest medical facility.    Cellulitis Cellulitis is an infection of the skin and the tissue beneath it. The infected area is usually red and tender. Cellulitis occurs most often in the arms and lower legs.  CAUSES  Cellulitis is caused by bacteria that enter the skin through cracks or cuts in the skin. The most common types of bacteria that cause cellulitis are staphylococci and streptococci. SIGNS AND SYMPTOMS   Redness and warmth.  Swelling.  Tenderness or pain.  Fever. DIAGNOSIS  Your health care provider can usually determine what is wrong based on a physical exam. Blood tests may also be done. TREATMENT  Treatment usually involves taking an antibiotic medicine. HOME CARE INSTRUCTIONS   Take your antibiotic medicine as directed by your health care provider. Finish the antibiotic even if you start to feel better.  Keep the infected arm or leg elevated to reduce swelling.  Apply a warm cloth to the affected area up to 4 times per day to relieve pain.  Take medicines only as directed by your health care provider.  Keep all follow-up visits as directed by your health care provider. SEEK MEDICAL CARE IF:   You notice red streaks coming from the infected area.  Your red area gets larger or turns dark in color.  Your bone or joint underneath the infected area becomes painful after the skin has healed.  Your infection returns in the same area or another area.  You notice a swollen bump in the infected area.  You  develop new symptoms.  You have a fever. SEEK IMMEDIATE MEDICAL CARE IF:   You feel very sleepy.  You develop vomiting or diarrhea.  You have a general ill feeling (malaise) with muscle aches and pains. MAKE SURE YOU:   Understand these instructions.  Will watch your condition.  Will get help right away if you are not doing well or get worse. Document Released: 05/05/2005 Document Revised: 12/10/2013 Document Reviewed: 10/11/2011 [facility] Patient Information 2015 [place], [place]. This information is not intended to replace advice given to you by your health care provider. Make sure you discuss any questions you have with your health care provider.

## 2015-02-14 NOTE — Telephone Encounter (Signed)
Spoke to wife  She states patient has an appointment with primary Dr Jenny Reichmann today at 4 pm.( labs maybe order at this appointment)  Wife will call on Monday to see if  BMET is needed to be order by Dr [name] 's office  She verbalized understanding.

## 2015-02-14 NOTE — Progress Notes (Signed)
Subjective:    Patient ID: [name] [name], male    DOB: June 20, 1946, 69 y.o.   MRN: [id number]  Chief Complaint  Patient presents with  . Cyst    cyst on the back of left leg, x1 week started out as a small red place like a bug bite and has grown and started having pain, iching, and burning with it now, discolored black color    HPI:  [name] [name] is a 69 y.o. male with a PMH of hypertension, atrial fibrillation, bronchitis, diabetes, gout who presents today for an acute visit.   This is a new problems. Associated symptom of a bump located on the posterior aspect of his right calf has been going on for about a week. Described as red with some itching and burning. Notes that it has grown in size and changed in color to black. It is tender to the touch and now his lower leg is red. Modifying factors of triple antibiotic did not help very much.   Allergies  Allergen Reactions  . Codeine     MAKES HIM GOOFY  . Heparin     ? Abdominal rash 01/2013    Current Outpatient Prescriptions on File Prior to Visit  Medication Sig Dispense Refill  . albuterol (PROVENTIL HFA;VENTOLIN HFA) 108 (90 BASE) MCG/ACT inhaler Inhale 2 puffs into the lungs every 6 (six) hours as needed for wheezing. 1 Inhaler 11  . allopurinol (ZYLOPRIM) 100 MG tablet TAKE 1 TABLET DAILY 90 tablet 2  . atorvastatin (LIPITOR) 20 MG tablet TAKE 1 TABLET DAILY 90 tablet 3  . cetirizine (ZYRTEC) 10 MG tablet Take 10 mg by mouth at bedtime.    Marland Kitchen diltiazem (CARDIZEM CD) 180 MG 24 hr capsule Take 1 capsule (180 mg total) by mouth daily. 90 capsule 3  . ELIQUIS 5 MG TABS tablet TAKE 1 TABLET TWICE A DAY 180 tablet 1  . furosemide (LASIX) 80 MG tablet Take 1.5 tablets (120 mg total) by mouth 2 (two) times daily. 270 tablet 0  . glimepiride (AMARYL) 1 MG tablet TAKE 1 TABLET IN THE MORNING by mouth, then 1/2 tab in the PM 4535 tablet 3  . metoprolol (LOPRESSOR) 50 MG tablet Take 1 tablet (50 mg total) by mouth 2 (two) times daily. 180 tablet  4  . potassium chloride SA (K-DUR,KLOR-CON) 20 MEQ tablet Take 1 tablet (20 mEq total) by mouth 2 (two) times daily. 180 tablet 3  . spironolactone (ALDACTONE) 25 MG tablet Take 1 tablet (25 mg total) by mouth daily. 90 tablet 3   No current facility-administered medications on file prior to visit.                                    Review of Systems  Constitutional: Positive for fever. Negative for diaphoresis.  Skin: Positive for color change and rash.      Objective:    BP 112/70 mmHg  Pulse 89  Temp(Src) 99.1 F (37.3 C) (Oral)  Resp 18  Ht 5\' 8"  (1.727 m)  Wt 276 lb 6.4 oz (125.374 kg)  BMI 42.04 kg/m2  SpO2 94% Nursing note and vital signs reviewed.  Physical Exam  Constitutional: He is oriented to person, place, and time. He appears well-developed and well-nourished. No distress.  Cardiovascular: Normal rate, regular rhythm, normal heart sounds and intact distal pulses.   Pulmonary/Chest: Effort normal and breath sounds normal.  Neurological:  He is alert and oriented to person, place, and time.  Skin: Skin is warm and dry. Rash noted.  Right lower extremity with obvious redness. Tender and warm to touch. Approximately 1 cm cyst located on the posterior calf which appears black/dark purple. Distal pulses are intact and appropriate.  Psychiatric: He has a normal mood and affect. His behavior is normal. Judgment and thought content normal.       Assessment & Plan:   Problem List Items Addressed This Visit    None

## 2015-02-14 NOTE — Telephone Encounter (Signed)
Has this been taken care of?

## 2015-02-14 NOTE — Assessment & Plan Note (Signed)
Symptoms and exam consistent with cellulitis and infected boil/blister. Start Bactrim to cover for MRSA. Treat conservatively with warm compresses and leg elevation. Patient instructed to monitor for further signs of redness, purulent discharge, fevers or worsening pain. If experienced, instructed to seek further medical care. Follow up if symptoms worsen or do not improve with antibiotic.

## 2015-02-14 NOTE — Telephone Encounter (Signed)
Left message to call back  

## 2015-02-18 ENCOUNTER — Telehealth: Payer: Self-pay | Admitting: Pulmonary Disease

## 2015-02-18 NOTE — Telephone Encounter (Signed)
Called spoke with daughter Hildred Laser. She is asking for VS to write a letter to excuse patient from [place] duty. She is aware if VS approves this then they will need to drop off Jury duty letter so we can get the information off the letter. Please advise Dr. Halford Chessman thanks

## 2015-02-20 NOTE — Telephone Encounter (Signed)
Pt daughter calling back to check on status of VS writing letter, told her that we are waiting to hear from him and that we would need her to drop off jury duty letter.Hillery Hunter

## 2015-02-20 NOTE — Telephone Encounter (Signed)
Patient's daughter left Software engineer for patient.  It is in Dr. Juanetta Gosling folder up front.

## 2015-02-21 ENCOUNTER — Encounter: Payer: Self-pay | Admitting: Pulmonary Disease

## 2015-02-21 NOTE — Telephone Encounter (Signed)
Letter written, and given to [place].

## 2015-02-21 NOTE — Telephone Encounter (Signed)
Letter placed in Envelope to be mailed to [place] [facility] at [facility].  Placed up front in outgoing mail. Will be mailed out on Monday.  LMTCB x 1 to make aware

## 2015-02-24 NOTE — Telephone Encounter (Signed)
Pt and daughter aware that letter is being mailed out today.  Nothing further needed.

## 2015-02-27 ENCOUNTER — Ambulatory Visit (INDEPENDENT_AMBULATORY_CARE_PROVIDER_SITE_OTHER): Payer: Medicare Other | Admitting: Internal Medicine

## 2015-02-27 ENCOUNTER — Encounter: Payer: Self-pay | Admitting: Internal Medicine

## 2015-02-27 VITALS — BP 124/80 | HR 75 | Temp 98.4°F | Ht 68.0 in | Wt 276.0 lb

## 2015-02-27 DIAGNOSIS — E119 Type 2 diabetes mellitus without complications: Secondary | ICD-10-CM

## 2015-02-27 DIAGNOSIS — I10 Essential (primary) hypertension: Secondary | ICD-10-CM | POA: Diagnosis not present

## 2015-02-27 DIAGNOSIS — L03115 Cellulitis of right lower limb: Secondary | ICD-10-CM

## 2015-02-27 MED ORDER — GLUCOSE BLOOD VI STRP: 1.0000 | ORAL_STRIP | Freq: Every day | Status: DC

## 2015-02-27 NOTE — Progress Notes (Signed)
Pre visit review using our clinic review tool, if applicable. No additional management support is needed unless otherwise documented below in the visit note. 

## 2015-02-27 NOTE — Assessment & Plan Note (Signed)
With abscess spont drained now overall improved, no further antibx needed

## 2015-02-27 NOTE — Patient Instructions (Signed)
Please continue all other medications as before, and refills have been done if requested.  Please have the pharmacy call with any other refills you may need.  Please continue your efforts at being more active, low cholesterol diet, and weight control.  Please keep your appointments with your specialists as you may have planned     

## 2015-02-27 NOTE — Assessment & Plan Note (Signed)
stable overall by history and exam, recent data reviewed with pt, and pt to continue medical treatment as before,  to f/u any worsening symptoms or concerns BP Readings from Last 3 Encounters:  02/27/15 124/80  02/14/15 112/70  10/22/14 124/66

## 2015-02-27 NOTE — Progress Notes (Signed)
Subjective:    Patient ID: [name] [name], male    DOB: Sep 12, 1945, 69 y.o.   MRN: [id number]  HPI   Here to f/u right post calf abscess now improved with pain/red/swelling resolved, with only a small superficial wound healing persistent. No fever, chills and Pt denies chest pain, increased sob or doe, wheezing, orthopnea, PND, increased LE swelling, palpitations, dizziness or syncope.  Pt denies new neurological symptoms such as new headache, or facial or extremity weakness or numbness   Pt denies polydipsia, polyuria Past Medical History  Diagnosis Date  . HYPERLIPIDEMIA   . GOUT   . Morbid obesity   . OBESITY HYPOVENTILATION SYNDROME     a. on home O2.  Marland Kitchen Unspecified hearing loss   . HYPERTENSION   . BRONCHITIS, CHRONIC 01/02/2009  . SKIN RASH 03/12/2010  . PROSTATE SPECIFIC ANTIGEN, ELEVATED 06/09/2007  . Long term (current) use of anticoagulants   . Complication of anesthesia     "aspiration pneumonia after hand OR" (01/05/2013)  . Permanent atrial fibrillation   . OSA (obstructive sleep apnea)   . DIABETES MELLITUS, TYPE II   . COMMON MIGRAINE     "none since treating for high BP"  . Arthritis     "knees and hands; thoracic area of the spine" (01/05/2013)  . On home oxygen therapy   . Acute right-sided CHF (congestive heart failure)     a. 01/2013.  . Pulmonary HTN     a. multifactorial including obstructive sleep apnea, obesity hypoventilation syndrome and possible pulmonary venous hypertension.   Past Surgical History  Procedure Laterality Date  . Appendectomy  1974  . Tendon repair Right 2009    "lacerated his tendon and pulley" Dr. Lenon Curt  . Cardioversion  05/21/2008; ~ 06/2008  . Inguinal hernia repair Right   . Hernia repair    . Umbilical hernia repair    . Right heart catheterization N/A 01/08/2013    Procedure: RIGHT HEART CATH;  Surgeon: Thayer Headings, MD;  Location: [facility] CATH LAB;  Service: Cardiovascular;  Laterality: N/A;    reports that he quit smoking about 36  years ago. His smoking use included Cigarettes. He has a 15 pack-year smoking history. He has never used smokeless tobacco. He reports that he drinks alcohol. He reports that he does not use illicit drugs. family history includes Alzheimer's disease in his father; Cancer in his father and other; Coronary artery disease in his other. Allergies  Allergen Reactions  . Codeine     MAKES HIM GOOFY  . Heparin     ? Abdominal rash 01/2013   Current Outpatient Prescriptions on File Prior to Visit  Medication Sig Dispense Refill  . albuterol (PROVENTIL HFA;VENTOLIN HFA) 108 (90 BASE) MCG/ACT inhaler Inhale 2 puffs into the lungs every 6 (six) hours as needed for wheezing. 1 Inhaler 11  . allopurinol (ZYLOPRIM) 100 MG tablet TAKE 1 TABLET DAILY 90 tablet 2  . atorvastatin (LIPITOR) 20 MG tablet TAKE 1 TABLET DAILY 90 tablet 3  . cetirizine (ZYRTEC) 10 MG tablet Take 10 mg by mouth at bedtime.    Marland Kitchen diltiazem (CARDIZEM CD) 180 MG 24 hr capsule Take 1 capsule (180 mg total) by mouth daily. 90 capsule 3  . ELIQUIS 5 MG TABS tablet TAKE 1 TABLET TWICE A DAY 180 tablet 1  . furosemide (LASIX) 80 MG tablet Take 1.5 tablets (120 mg total) by mouth 2 (two) times daily. 270 tablet 0  . glimepiride (AMARYL) 1 MG tablet  TAKE 1 TABLET IN THE MORNING by mouth, then 1/2 tab in the PM 4535 tablet 3  . metoprolol (LOPRESSOR) 50 MG tablet Take 1 tablet (50 mg total) by mouth 2 (two) times daily. 180 tablet 4  . potassium chloride SA (K-DUR,KLOR-CON) 20 MEQ tablet Take 1 tablet (20 mEq total) by mouth 2 (two) times daily. 180 tablet 3  . spironolactone (ALDACTONE) 25 MG tablet Take 1 tablet (25 mg total) by mouth daily. 90 tablet 3  . sulfamethoxazole-trimethoprim (BACTRIM DS,SEPTRA DS) 800-160 MG per tablet Take 1 tablet by mouth 2 (two) times daily. (Patient not taking: Reported on 02/27/2015) 20 tablet 0   No current facility-administered medications on file prior to visit.   Review of Systems / Constitutional:  Negative for unusual diaphoresis or night sweats HENT: Negative for ringing in ear or discharge Eyes: Negative for double vision or worsening visual disturbance.  Respiratory: Negative for choking and stridor.   Gastrointestinal: Negative for vomiting or other signifcant bowel change Genitourinary: Negative for hematuria or change in urine volume.  Musculoskeletal: Negative for other MSK pain or swelling Skin: Negative for color change and worsening wound.  Neurological: Negative for tremors and numbness other than noted  Psychiatric/Behavioral: Negative for decreased concentration or agitation other than above  \    Objective:   Physical Exam BP 124/80 mmHg  Pulse 75  Temp(Src) 98.4 F (36.9 C) (Oral)  Ht 5\' 8"  (1.727 m)  Wt 276 lb (125.193 kg)  BMI 41.98 kg/m2  SpO2 96% VS noted,  Constitutional: Pt appears in no significant distress HENT: Head: NCAT.  Right Ear: External ear normal.  Left Ear: External ear normal.  Eyes: . Pupils are equal, round, and reactive to light. Conjunctivae and EOM are normal Neck: Normal range of motion. Neck supple.  Cardiovascular: Normal rate and regular rhythm.   Pulmonary/Chest: Effort normal and breath sounds decreased without rales or wheezing.  Abd:  Soft, NT, ND, + BS Neurological: Pt is alert. Not confused , motor grossly intact Skin: Skin is warm. No rash, no LE edema, right post calf with quarter sized wound only few mm deep without red/swelling/tender Psychiatric: Pt behavior is normal. No agitation.      Assessment & Plan:

## 2015-02-27 NOTE — Assessment & Plan Note (Signed)
stable overall by history and exam, recent data reviewed with pt, and pt to continue medical treatment as before,  to f/u any worsening symptoms or concerns Lab Results  Component Value Date   HGBA1C 7.2* 10/10/2014

## 2015-03-03 ENCOUNTER — Other Ambulatory Visit: Payer: Self-pay

## 2015-03-03 DIAGNOSIS — R0602 Shortness of breath: Secondary | ICD-10-CM

## 2015-03-03 DIAGNOSIS — I4819 Other persistent atrial fibrillation: Secondary | ICD-10-CM

## 2015-03-03 MED ORDER — FUROSEMIDE 80 MG PO TABS: 120.0000 mg | ORAL_TABLET | Freq: Two times a day (BID) | ORAL | Status: DC

## 2015-03-03 NOTE — Progress Notes (Signed)
HPI: FU permanent AFib and right side CHF. He has failed DCCV in the past. Myoview 7/09: EF 54%, normal perfusion. Holter 09/2010: AFib with mildly decreased rate. He was admitted 12/2012 with acute right heart failure in the setting of significant pulmonary hypertension which is multifactorial including OSA, OHS and possible pulmonary venous hypertension. [place] 01/08/13 demonstrated elevated R heart pressures (RA 15, RV 44/11, PCWP 21, PA 41/30, CO 4.49, CI 1.8, PA sat 61%). He was transitioned from Coumadin to Apixaban 5 mg twice a day. Echo 5/14: Mild LVH, EF 55%, mod to severe LAE, mild RVE, mild reduced RVSF, mild RAE. Patient had some degree of renal insufficiency and hyperkalemia on a combination of ACE inhibitor and spironolactone. Medications adjusted (ACEI and spironolactone DCed and lasix reduced); K and renal function improved. Spironolactone resumed. Since last seen he denies increased dyspnea, chest pain, palpitations or syncope. No bleeding. His edema is reasonably well controlled.  Current Outpatient Prescriptions  Medication Sig Dispense Refill  . albuterol (PROVENTIL HFA;VENTOLIN HFA) 108 (90 BASE) MCG/ACT inhaler Inhale 2 puffs into the lungs every 6 (six) hours as needed for wheezing. 1 Inhaler 11  . allopurinol (ZYLOPRIM) 100 MG tablet TAKE 1 TABLET DAILY 90 tablet 2  . atorvastatin (LIPITOR) 20 MG tablet TAKE 1 TABLET DAILY 90 tablet 3  . cetirizine (ZYRTEC) 10 MG tablet Take 10 mg by mouth at bedtime.    Marland Kitchen diltiazem (CARDIZEM CD) 180 MG 24 hr capsule Take 1 capsule (180 mg total) by mouth daily. 90 capsule 3  . ELIQUIS 5 MG TABS tablet TAKE 1 TABLET TWICE A DAY 180 tablet 1  . furosemide (LASIX) 80 MG tablet Take 1.5 tablets (120 mg total) by mouth 2 (two) times daily. 270 tablet 2  . glimepiride (AMARYL) 1 MG tablet TAKE 1 TABLET IN THE MORNING by mouth, then 1/2 tab in the PM 4535 tablet 3  . glucose blood (ONE TOUCH ULTRA TEST) test strip 1 each by Other route daily. Use as  instructed Dx code E11.9 100 each 5  . metoprolol (LOPRESSOR) 50 MG tablet Take 1 tablet (50 mg total) by mouth 2 (two) times daily. 180 tablet 4  . potassium chloride SA (K-DUR,KLOR-CON) 20 MEQ tablet Take 1 tablet (20 mEq total) by mouth 2 (two) times daily. 180 tablet 3  . spironolactone (ALDACTONE) 25 MG tablet Take 1 tablet (25 mg total) by mouth daily. 90 tablet 3   No current facility-administered medications for this visit.     Past Medical History  Diagnosis Date  . HYPERLIPIDEMIA   . GOUT   . Morbid obesity   . OBESITY HYPOVENTILATION SYNDROME     a. on home O2.  Marland Kitchen Unspecified hearing loss   . HYPERTENSION   . BRONCHITIS, CHRONIC 01/02/2009  . SKIN RASH 03/12/2010  . PROSTATE SPECIFIC ANTIGEN, ELEVATED 06/09/2007  . Long term (current) use of anticoagulants   . Complication of anesthesia     "aspiration pneumonia after hand OR" (01/05/2013)  . Permanent atrial fibrillation   . OSA (obstructive sleep apnea)   . DIABETES MELLITUS, TYPE II   . COMMON MIGRAINE     "none since treating for high BP"  . Arthritis     "knees and hands; thoracic area of the spine" (01/05/2013)  . On home oxygen therapy   . Acute right-sided CHF (congestive heart failure)     a. 01/2013.  . Pulmonary HTN     a. multifactorial including obstructive sleep apnea,  obesity hypoventilation syndrome and possible pulmonary venous hypertension.    Past Surgical History  Procedure Laterality Date  . Appendectomy  1974  . Tendon repair Right 2009    "lacerated his tendon and pulley" Dr. Lenon Curt  . Cardioversion  05/21/2008; ~ 06/2008  . Inguinal hernia repair Right   . Hernia repair    . Umbilical hernia repair    . Right heart catheterization N/A 01/08/2013    Procedure: RIGHT HEART CATH;  Surgeon: Thayer Headings, MD;  Location: [facility] CATH LAB;  Service: Cardiovascular;  Laterality: N/A;    History   Social History  . Marital Status: Married    Spouse Name: N/A  . Number of Children: 2  . Years of  Education: N/A   Occupational History  . sheet metal Public librarian    Social History Main Topics  . Smoking status: Former Smoker -- 1.00 packs/day for 15 years    Types: Cigarettes    Quit date: 08/09/1978  . Smokeless tobacco: Never Used     Comment: 01/05/2013 "quit smoking ~ 40 years ago"  . Alcohol Use: 0.0 oz/week    0 Standard drinks or equivalent per week     Comment: 01/05/2013 "hasn't had a drink since 1980's; never had problem w/it"  . Drug Use: No  . Sexual Activity: Not Currently   Other Topics Concern  . Not on file   Social History Narrative    ROS: no fevers or chills, productive cough, hemoptysis, dysphasia, odynophagia, melena, hematochezia, dysuria, hematuria, rash, seizure activity, orthopnea, PND, pedal edema, claudication. Remaining systems are negative.  Physical Exam: Well-developed obese in no acute distress.  Skin is warm and dry.  HEENT is normal.  Neck is supple.  Chest is clear to auscultation with normal expansion.  Cardiovascular exam is irregular Abdominal exam nontender or distended. No masses palpated. Extremities show 1+ ankle edema, chronic skin changes. neuro grossly intact  ECG atrial fibrillation at a rate of 64. Right axis deviation. Septal infarct. Low voltage.

## 2015-03-03 NOTE — Telephone Encounter (Signed)
Rx(s) sent to pharmacy electronically.  

## 2015-03-04 ENCOUNTER — Ambulatory Visit (INDEPENDENT_AMBULATORY_CARE_PROVIDER_SITE_OTHER): Payer: Medicare Other | Admitting: Cardiology

## 2015-03-04 ENCOUNTER — Encounter: Payer: Self-pay | Admitting: Cardiology

## 2015-03-04 VITALS — BP 100/80 | HR 64 | Ht 68.0 in | Wt 275.0 lb

## 2015-03-04 DIAGNOSIS — I482 Chronic atrial fibrillation, unspecified: Secondary | ICD-10-CM

## 2015-03-04 DIAGNOSIS — E785 Hyperlipidemia, unspecified: Secondary | ICD-10-CM | POA: Diagnosis not present

## 2015-03-04 DIAGNOSIS — I4891 Unspecified atrial fibrillation: Secondary | ICD-10-CM | POA: Diagnosis not present

## 2015-03-04 DIAGNOSIS — I10 Essential (primary) hypertension: Secondary | ICD-10-CM | POA: Diagnosis not present

## 2015-03-04 DIAGNOSIS — I2781 Cor pulmonale (chronic): Secondary | ICD-10-CM | POA: Diagnosis not present

## 2015-03-04 LAB — BASIC METABOLIC PANEL WITH GFR
BUN: 47 mg/dL — AB (ref 7–25)
CHLORIDE: 100 meq/L (ref 98–110)
CO2: 29 mEq/L (ref 20–31)
Calcium: 9.3 mg/dL (ref 8.6–10.3)
Creat: 1.57 mg/dL — ABNORMAL HIGH (ref 0.70–1.25)
GFR, EST AFRICAN AMERICAN: 51 mL/min — AB (ref >=60)
GFR, Est Non African American: 44 mL/min — ABNORMAL LOW (ref >=60)
Glucose, Bld: 80 mg/dL (ref 65–99)
POTASSIUM: 5 meq/L (ref 3.5–5.3)
SODIUM: 139 meq/L (ref 135–146)

## 2015-03-04 LAB — CBC
HCT: 44.4 % (ref 39.0–52.0)
HEMOGLOBIN: 15 g/dL (ref 13.0–17.0)
MCH: 30.1 pg (ref 26.0–34.0)
MCHC: 33.8 g/dL (ref 30.0–36.0)
MCV: 89 fL (ref 78.0–100.0)
MPV: 9.6 fL (ref 8.6–12.4)
Platelets: 281 10*3/uL (ref 150–400)
RBC: 4.99 MIL/uL (ref 4.22–5.81)
RDW: 15.4 % (ref 11.5–15.5)
WBC: 12 10*3/uL — ABNORMAL HIGH (ref 4.0–10.5)

## 2015-03-04 NOTE — Patient Instructions (Signed)
Your physician wants you to follow-up in: 6 MONTHS WITH DR CRENSHAW You will receive a reminder letter in the mail two months in advance. If you don't receive a letter, please call our office to schedule the follow-up appointment.   Your physician recommends that you HAVE LAB WORK TODAY 

## 2015-03-04 NOTE — Assessment & Plan Note (Signed)
Continue present dose of diuretics. We discussed low sodium diet and fluid restriction.check potassium and renal function.

## 2015-03-04 NOTE — Assessment & Plan Note (Signed)
Blood pressure controlled. Continue present medications. 

## 2015-03-04 NOTE — Assessment & Plan Note (Signed)
Patient remains in permanent atrial fibrillation. Continue Cardizem and beta blocker. Continue apixaban. Check hemoglobin and renal function.

## 2015-03-04 NOTE — Assessment & Plan Note (Signed)
Continue statin. 

## 2015-03-05 ENCOUNTER — Other Ambulatory Visit: Payer: Self-pay | Admitting: *Deleted

## 2015-03-05 DIAGNOSIS — I50811 Acute right heart failure: Secondary | ICD-10-CM

## 2015-03-06 ENCOUNTER — Telehealth: Payer: Self-pay

## 2015-03-06 NOTE — Telephone Encounter (Signed)
PA is needed for Onetouch ulta test strips???? Am I continuing or do we need to switch?

## 2015-03-06 NOTE — Telephone Encounter (Signed)
We can certainly switch to any stip covered by his insurance.  Unfortunately, I would not know which one, since this is between him and his insurance, and the MD's are not notified or have access to that information.  Please ask pt to inquire at the pharmacy or call his insurance about which machine and strips are covered. thanks

## 2015-03-06 NOTE — Telephone Encounter (Signed)
[place] office: If pt calls back I just called to see what type of Glucose meter pt is using. The test strips we sent to pharmacy his ins is not going to cover so If I can find out what meter he is using then I can get the info from ins to get what kind of test strips will be covered, Thanks!

## 2015-03-13 ENCOUNTER — Telehealth: Payer: Self-pay | Admitting: Pulmonary Disease

## 2015-03-13 NOTE — Telephone Encounter (Signed)
Misty has called as well for D/L - [facility] aware that D/L is needed for OV tomorrow 03/14/15 Awaiting fax if able to obtain.

## 2015-03-13 NOTE — Telephone Encounter (Signed)
Spoke with Ailene Ravel at [facility], states that they do not have a current download on this pt but states she will try to get one today if possible in time for his appt tomorrow. Forwarding to Ashtyn to make aware.

## 2015-03-14 ENCOUNTER — Encounter: Payer: Self-pay | Admitting: Pulmonary Disease

## 2015-03-14 ENCOUNTER — Ambulatory Visit (INDEPENDENT_AMBULATORY_CARE_PROVIDER_SITE_OTHER): Payer: Medicare Other | Admitting: Pulmonary Disease

## 2015-03-14 VITALS — BP 110/80 | HR 68 | Temp 98.7°F | Ht 68.0 in | Wt 281.0 lb

## 2015-03-14 DIAGNOSIS — G4733 Obstructive sleep apnea (adult) (pediatric): Secondary | ICD-10-CM | POA: Diagnosis not present

## 2015-03-14 DIAGNOSIS — E662 Morbid (severe) obesity with alveolar hypoventilation: Secondary | ICD-10-CM | POA: Diagnosis not present

## 2015-03-14 NOTE — Telephone Encounter (Signed)
[name] [name] contact [name] this afternoon and they stated that they have his card to download and will fax on Monday.  Will hold in my to follow up

## 2015-03-14 NOTE — Progress Notes (Signed)
Chief Complaint  Patient presents with  . Sleep Apnea    wearing CPAP every night, approx 8 hour nightly.  mask fitting okay.  no concerns.     History of Present Illness: [name] [name] is a 69 y.o. male severe OSA, OHS, and chronic bronchitis with history of tobacco abuse.  He has been doing well with BiPAP.  He gets about 9 hours sleep.  He has no issue with mask fit.  He uses 3 liters oxygen during the day, and 4 liters at night with BiPAP.  He is not having cough, and has not been using albuterol.   TESTS: PSG 06/18/08 >> AHI 64, BPAP 18/9 with 4 liters oxygen Spirometry 04/03/09 >> FEV1 2.08(69%), FEV1% 77 Echo 12/27/12 >> mild LVH, EF 55%, mod/severe LA dilation ONO with BiPAP and 4 liters 01/18/13 >> Test time 8 hrs 55 min. Basal SpO2 91.7%, low SpO2 83%. Spent 32 min with SpO2 < 88%. Auto BiPAP 02/01/13 to 02/14/13 >> Used on 14 of 14 nights with average 9 hrs 58 min.  Average AHI 13 (mostly hypopneas) with median IPAP 12 cm H2O and EPAP 7 cm H2O. PFT 02/16/13 >> FEV1 2.06 (76%), FEV1% 85, TLC 1.64 (74%), DLCO 78%, no BD BiPAP 11/21/13 to 02/18/14 >> used on 90 of 90 nights with average 8 hrs 48 min. Average AHI 11.7 with median EPAP 9 cm H2O and maximum IPAP 11 cm H2O.  PMHx >> HTN, A fib, HLD, DM, Gout, Migraine HA   PSHx, Medications, Allergies, Fhx, Shx reviewed.  Physical Exam: BP 110/80 mmHg  Pulse 68  Temp(Src) 98.7 F (37.1 C) (Oral)  Ht 5\' 8"  (1.727 m)  Wt 281 lb (127.461 kg)  BMI 42.74 kg/m2  SpO2 97%  General - No distress, wearing oxygen ENT - No sinus tenderness, MP 4, no oral exudate, no LAN Cardiac - s1s2 regular, no murmur Chest - no wheezing Back - No focal tenderness Abd - Soft, non-tender Ext - 1+ edema b/l Neuro - Normal strength Skin - chronic venous stasis changes Psych - normal mood, and behavior  Assessment/Plan:  Obstructive sleep apnea. He is compliant with BiPAP and reports benefit. Plan: - will get copy of his download  Obesity  hypoventilation syndrome. Plan: - continue 3 liters oxygen at rest and 4 liters oxygen with sleep  Chronic bronchitis. Not much of an issue. Plan: - prn BiPAP  Obesity. Plan: - discussed importance of weight loss    Chesley Mires, MD [facility] Pager:  [phone]

## 2015-03-14 NOTE — Telephone Encounter (Signed)
Spoke with [name] [name] at [facility] States that they left a message on the patients VM to bring machine by for D/L - pt never returned call or brought machine by.  Will let Dr Halford Chessman know, patient is being seen today and will need an order sent to [facility] for download of CPAP machine for compliance check. Nothing further needed.

## 2015-03-14 NOTE — Telephone Encounter (Signed)
Has this fax been received? Thanks!

## 2015-03-14 NOTE — Telephone Encounter (Signed)
Okay to send order to get download.

## 2015-03-14 NOTE — Patient Instructions (Signed)
Will call with results of BiPAP report Follow up in 1 year

## 2015-03-19 NOTE — Telephone Encounter (Signed)
Called [facility] - spoke with Apolonio Schneiders States that she does not see where they have a download yet - not sure if the patient has dropped off card yet.  Per patient when he was here 03/14/15, he dropped card off at [facility]. Apolonio Schneiders is going to check on this and call me back.

## 2015-03-25 NOTE — Telephone Encounter (Signed)
Spoke with Apolonio Schneiders, they attempted to D/L the patient SD card and was unable to get any data off of card.  Per Apolonio Schneiders, pt is bringing machine in to be downloaded some time this week and then report will be faxed to our office.  Will await fax and follow up later this week.

## 2015-04-01 DIAGNOSIS — I509 Heart failure, unspecified: Secondary | ICD-10-CM | POA: Diagnosis not present

## 2015-04-02 ENCOUNTER — Other Ambulatory Visit: Payer: Self-pay | Admitting: *Deleted

## 2015-04-02 DIAGNOSIS — E876 Hypokalemia: Secondary | ICD-10-CM

## 2015-04-02 DIAGNOSIS — E875 Hyperkalemia: Secondary | ICD-10-CM

## 2015-04-02 LAB — BASIC METABOLIC PANEL WITH GFR
BUN: 42 mg/dL — ABNORMAL HIGH (ref 7–25)
CO2: 31 mmol/L (ref 20–31)
Calcium: 9.7 mg/dL (ref 8.6–10.3)
Chloride: 99 mmol/L (ref 98–110)
Creat: 1.3 mg/dL — ABNORMAL HIGH (ref 0.70–1.25)
GFR, EST NON AFRICAN AMERICAN: 56 mL/min — AB (ref >=60)
GFR, Est African American: 64 mL/min (ref >=60)
GLUCOSE: 150 mg/dL — AB (ref 65–99)
POTASSIUM: 5.2 mmol/L (ref 3.5–5.3)
Sodium: 138 mmol/L (ref 135–146)

## 2015-04-02 MED ORDER — POTASSIUM CHLORIDE CRYS ER 20 MEQ PO TBCR: 20.0000 meq | EXTENDED_RELEASE_TABLET | Freq: Every day | ORAL | Status: DC

## 2015-04-12 ENCOUNTER — Other Ambulatory Visit: Payer: Self-pay | Admitting: Cardiology

## 2015-04-13 ENCOUNTER — Other Ambulatory Visit: Payer: Self-pay | Admitting: Cardiology

## 2015-04-15 ENCOUNTER — Other Ambulatory Visit (INDEPENDENT_AMBULATORY_CARE_PROVIDER_SITE_OTHER): Payer: Medicare Other

## 2015-04-15 ENCOUNTER — Encounter: Payer: Self-pay | Admitting: Internal Medicine

## 2015-04-15 ENCOUNTER — Ambulatory Visit (INDEPENDENT_AMBULATORY_CARE_PROVIDER_SITE_OTHER): Payer: Medicare Other | Admitting: Internal Medicine

## 2015-04-15 VITALS — BP 114/78 | HR 78 | Temp 97.6°F | Ht 68.0 in | Wt 281.0 lb

## 2015-04-15 DIAGNOSIS — Z205 Contact with and (suspected) exposure to viral hepatitis: Secondary | ICD-10-CM

## 2015-04-15 DIAGNOSIS — R972 Elevated prostate specific antigen [PSA]: Secondary | ICD-10-CM

## 2015-04-15 DIAGNOSIS — Z23 Encounter for immunization: Secondary | ICD-10-CM

## 2015-04-15 DIAGNOSIS — E785 Hyperlipidemia, unspecified: Secondary | ICD-10-CM

## 2015-04-15 DIAGNOSIS — E119 Type 2 diabetes mellitus without complications: Secondary | ICD-10-CM

## 2015-04-15 DIAGNOSIS — I10 Essential (primary) hypertension: Secondary | ICD-10-CM

## 2015-04-15 DIAGNOSIS — Z20828 Contact with and (suspected) exposure to other viral communicable diseases: Secondary | ICD-10-CM | POA: Diagnosis not present

## 2015-04-15 LAB — URINALYSIS, ROUTINE W REFLEX MICROSCOPIC
BILIRUBIN URINE: NEGATIVE
Hgb urine dipstick: NEGATIVE
KETONES UR: NEGATIVE
Leukocytes, UA: NEGATIVE
Nitrite: NEGATIVE
PH: 7 (ref 5.0–8.0)
RBC / HPF: NONE SEEN (ref 0–?)
TOTAL PROTEIN, URINE-UPE24: NEGATIVE
UROBILINOGEN UA: 0.2 (ref 0.0–1.0)
Urine Glucose: NEGATIVE
WBC UA: NONE SEEN (ref 0–?)

## 2015-04-15 LAB — BASIC METABOLIC PANEL
BUN: 37 mg/dL — AB (ref 6–23)
CHLORIDE: 97 meq/L (ref 96–112)
CO2: 38 mEq/L — ABNORMAL HIGH (ref 19–32)
CREATININE: 1.33 mg/dL (ref 0.40–1.50)
Calcium: 10.1 mg/dL (ref 8.4–10.5)
GFR: 56.61 mL/min — ABNORMAL LOW (ref >60.00)
GLUCOSE: 179 mg/dL — AB (ref 70–99)
POTASSIUM: 4.8 meq/L (ref 3.5–5.1)
Sodium: 141 mEq/L (ref 135–145)

## 2015-04-15 LAB — CBC WITH DIFFERENTIAL/PLATELET
BASOS ABS: 0 10*3/uL (ref 0.0–0.1)
BASOS PCT: 0.2 % (ref 0.0–3.0)
EOS ABS: 0.3 10*3/uL (ref 0.0–0.7)
Eosinophils Relative: 2 % (ref 0.0–5.0)
HEMATOCRIT: 47.7 % (ref 39.0–52.0)
Hemoglobin: 15.5 g/dL (ref 13.0–17.0)
LYMPHS ABS: 2.8 10*3/uL (ref 0.7–4.0)
LYMPHS PCT: 19.1 % (ref 12.0–46.0)
MCHC: 32.4 g/dL (ref 30.0–36.0)
MCV: 91.1 fl (ref 78.0–100.0)
MONO ABS: 0.8 10*3/uL (ref 0.1–1.0)
Monocytes Relative: 5.2 % (ref 3.0–12.0)
NEUTROS ABS: 10.7 10*3/uL — AB (ref 1.4–7.7)
NEUTROS PCT: 73.5 % (ref 43.0–77.0)
PLATELETS: 260 10*3/uL (ref 150.0–400.0)
RBC: 5.24 Mil/uL (ref 4.22–5.81)
RDW: 16.2 % — AB (ref 11.5–15.5)
WBC: 14.5 10*3/uL — ABNORMAL HIGH (ref 4.0–10.5)

## 2015-04-15 LAB — HEPATIC FUNCTION PANEL
ALK PHOS: 69 U/L (ref 39–117)
ALT: 16 U/L (ref 0–53)
AST: 16 U/L (ref 0–37)
Albumin: 3.9 g/dL (ref 3.5–5.2)
BILIRUBIN DIRECT: 0.2 mg/dL (ref 0.0–0.3)
BILIRUBIN TOTAL: 1.1 mg/dL (ref 0.2–1.2)
TOTAL PROTEIN: 7.5 g/dL (ref 6.0–8.3)

## 2015-04-15 LAB — LIPID PANEL
CHOL/HDL RATIO: 3
CHOLESTEROL: 129 mg/dL (ref 0–200)
HDL: 37 mg/dL — AB (ref >39.00)
LDL Cholesterol: 52 mg/dL (ref 0–99)
NonHDL: 92.31
TRIGLYCERIDES: 200 mg/dL — AB (ref 0.0–149.0)
VLDL: 40 mg/dL (ref 0.0–40.0)

## 2015-04-15 LAB — HEPATITIS C ANTIBODY: HCV AB: NEGATIVE

## 2015-04-15 LAB — PSA: PSA: 1.25 ng/mL (ref 0.10–4.00)

## 2015-04-15 LAB — MICROALBUMIN / CREATININE URINE RATIO
Creatinine,U: 32.2 mg/dL
Microalb Creat Ratio: 2.2 mg/g (ref 0.0–30.0)

## 2015-04-15 LAB — HEMOGLOBIN A1C: Hgb A1c MFr Bld: 7.1 % — ABNORMAL HIGH (ref 4.6–6.5)

## 2015-04-15 LAB — TSH: TSH: 1.53 u[IU]/mL (ref 0.35–4.50)

## 2015-04-15 NOTE — Assessment & Plan Note (Signed)
stable overall by history and exam, recent data reviewed with pt, and pt to continue medical treatment as before,  to f/u any worsening symptoms or concerns Lab Results  Component Value Date   HGBA1C 7.2* 10/10/2014    

## 2015-04-15 NOTE — Assessment & Plan Note (Signed)
stable overall by history and exam, recent data reviewed with pt, and pt to continue medical treatment as before,  to f/u any worsening symptoms or concerns Lab Results  Component Value Date   LDLCALC 44 10/10/2014

## 2015-04-15 NOTE — Progress Notes (Signed)
Subjective:    Patient ID: [name] [name], male    DOB: 1946-03-19, 69 y.o.   MRN: [id number]  HPI  Here for yearly f/u;  Overall doing ok;  Pt denies Chest pain, worsening SOB, DOE, wheezing, orthopnea, PND, worsening LE edema, palpitations, dizziness or syncope.  Pt denies neurological change such as new headache, facial or extremity weakness.  Pt denies polydipsia, polyuria, or low sugar symptoms. Pt states overall good compliance with treatment and medications, good tolerability, and has been trying to follow appropriate diet.  Pt denies worsening depressive symptoms, suicidal ideation or panic. No fever, night sweats, wt loss, loss of appetite, or other constitutional symptoms.  Pt states good ability with ADL's, has low fall risk, home safety reviewed and adequate, no other significant changes in hearing or vision, and only occasionally active with exercise.  No new complaints. Past Medical History  Diagnosis Date  . HYPERLIPIDEMIA   . GOUT   . Morbid obesity   . OBESITY HYPOVENTILATION SYNDROME     a. on home O2.  Marland Kitchen Unspecified hearing loss   . HYPERTENSION   . BRONCHITIS, CHRONIC 01/02/2009  . SKIN RASH 03/12/2010  . PROSTATE SPECIFIC ANTIGEN, ELEVATED 06/09/2007  . Long term (current) use of anticoagulants   . Complication of anesthesia     "aspiration pneumonia after hand OR" (01/05/2013)  . Permanent atrial fibrillation   . OSA (obstructive sleep apnea)   . DIABETES MELLITUS, TYPE II   . COMMON MIGRAINE     "none since treating for high BP"  . Arthritis     "knees and hands; thoracic area of the spine" (01/05/2013)  . On home oxygen therapy   . Acute right-sided CHF (congestive heart failure)     a. 01/2013.  . Pulmonary HTN     a. multifactorial including obstructive sleep apnea, obesity hypoventilation syndrome and possible pulmonary venous hypertension.   Past Surgical History  Procedure Laterality Date  . Appendectomy  1974  . Tendon repair Right 2009    "lacerated his  tendon and pulley" Dr. Lenon Curt  . Cardioversion  05/21/2008; ~ 06/2008  . Inguinal hernia repair Right   . Hernia repair    . Umbilical hernia repair    . Right heart catheterization N/A 01/08/2013    Procedure: RIGHT HEART CATH;  Surgeon: Thayer Headings, MD;  Location: [facility] CATH LAB;  Service: Cardiovascular;  Laterality: N/A;    reports that he quit smoking about 36 years ago. His smoking use included Cigarettes. He has a 15 pack-year smoking history. He has never used smokeless tobacco. He reports that he drinks alcohol. He reports that he does not use illicit drugs. family history includes Alzheimer's disease in his father; Cancer in his father and other; Coronary artery disease in his other. Allergies  Allergen Reactions  . Codeine     MAKES HIM GOOFY  . Heparin     ? Abdominal rash 01/2013   Current Outpatient Prescriptions on File Prior to Visit  Medication Sig Dispense Refill  . albuterol (PROVENTIL HFA;VENTOLIN HFA) 108 (90 BASE) MCG/ACT inhaler Inhale 2 puffs into the lungs every 6 (six) hours as needed for wheezing. 1 Inhaler 11  . allopurinol (ZYLOPRIM) 100 MG tablet TAKE 1 TABLET DAILY 90 tablet 2  . atorvastatin (LIPITOR) 20 MG tablet TAKE 1 TABLET DAILY 90 tablet 3  . cetirizine (ZYRTEC) 10 MG tablet Take 10 mg by mouth at bedtime.    Marland Kitchen diltiazem (CARDIZEM CD) 180 MG 24 hr  capsule Take 1 capsule (180 mg total) by mouth daily. 90 capsule 3  . ELIQUIS 5 MG TABS tablet TAKE 1 TABLET TWICE A DAY 180 tablet 1  . furosemide (LASIX) 80 MG tablet Take 1.5 tablets (120 mg total) by mouth 2 (two) times daily. 270 tablet 2  . glimepiride (AMARYL) 1 MG tablet TAKE 1 TABLET IN THE MORNING by mouth, then 1/2 tab in the PM 4535 tablet 3  . glucose blood (ONE TOUCH ULTRA TEST) test strip 1 each by Other route daily. Use as instructed Dx code E11.9 100 each 5  . metoprolol (LOPRESSOR) 50 MG tablet Take 1 tablet (50 mg total) by mouth 2 (two) times daily. 180 tablet 4  . potassium chloride SA  (K-DUR,KLOR-CON) 20 MEQ tablet Take 1 tablet (20 mEq total) by mouth daily. 180 tablet 3  . spironolactone (ALDACTONE) 25 MG tablet Take 1 tablet (25 mg total) by mouth daily. 90 tablet 3   No current facility-administered medications on file prior to visit.   Review of Systems Constitutional: Negative for increased diaphoresis, other activity, appetite or siginficant weight change other than noted HENT: Negative for worsening hearing loss, ear pain, facial swelling, mouth sores and neck stiffness.   Eyes: Negative for other worsening pain, redness or visual disturbance.  Respiratory: Negative for shortness of breath and wheezing  Cardiovascular: Negative for chest pain and palpitations.  Gastrointestinal: Negative for diarrhea, blood in stool, abdominal distention or other pain Genitourinary: Negative for hematuria, flank pain or change in urine volume.  Musculoskeletal: Negative for myalgias or other joint complaints.  Skin: Negative for color change and wound or drainage.  Neurological: Negative for syncope and numbness. other than noted Hematological: Negative for adenopathy. or other swelling Psychiatric/Behavioral: Negative for hallucinations, SI, self-injury, decreased concentration or other worsening agitation.      Objective:   Physical Exam BP 114/78 mmHg  Pulse 78  Temp(Src) 97.6 F (36.4 C) (Oral)  Ht 5\' 8"  (1.727 m)  Wt 281 lb (127.461 kg)  BMI 42.74 kg/m2  SpO2 96% VS noted,  Constitutional: Pt is oriented to person, place, and time. Appears well-developed and well-nourished, in no significant distress Head: Normocephalic and atraumatic.  Right Ear: External ear normal.  Left Ear: External ear normal.  Nose: Nose normal.  Mouth/Throat: Oropharynx is clear and moist.  Eyes: Conjunctivae and EOM are normal. Pupils are equal, round, and reactive to light.  Neck: Normal range of motion. Neck supple. No JVD present. No tracheal deviation present or significant neck LA  or mass Cardiovascular: Normal rate, regular rhythm, normal heart sounds and intact distal pulses.   Pulmonary/Chest: Effort normal and breath sounds decreased  without rales or wheezing  Abdominal: Soft. Bowel sounds are normal. NT. No HSM  Musculoskeletal: Normal range of motion. Exhibits no edema.  Lymphadenopathy:  Has no cervical adenopathy.  Neurological: Pt is alert and oriented to person, place, and time. Pt has normal reflexes. No cranial nerve deficit. Motor grossly intact Skin: Skin is warm and dry. No rash noted.  Psychiatric:  Has normal mood and affect. Behavior is normal.       Assessment & Plan:

## 2015-04-15 NOTE — Assessment & Plan Note (Signed)
Also for f/u psa as he is due 

## 2015-04-15 NOTE — Assessment & Plan Note (Signed)
stable overall by history and exam, recent data reviewed with pt, and pt to continue medical treatment as before,  to f/u any worsening symptoms or concerns BP Readings from Last 3 Encounters:  04/15/15 114/78  03/14/15 110/80  03/04/15 100/80

## 2015-04-15 NOTE — Patient Instructions (Signed)
You had the flu shot today  Please continue all other medications as before, and refills have been done if requested.  Please have the pharmacy call with any other refills you may need.  Please continue your efforts at being more active, low cholesterol diet, and weight control.  You are otherwise up to date with prevention measures today.  Please keep your appointments with your specialists as you may have planned  Please go to the LAB in the Basement (turn left off the elevator) for the tests to be done today, including the Hep C testing  You will be contacted by phone if any changes need to be made immediately.  Otherwise, you will receive a letter about your results with an explanation, but please check with MyChart first.  Please remember to sign up for MyChart if you have not done so, as this will be important to you in the future with finding out test results, communicating by private email, and scheduling acute appointments online when needed.  Please return in 6 months, or sooner if needed

## 2015-04-15 NOTE — Progress Notes (Signed)
Pre visit review using our clinic review tool, if applicable. No additional management support is needed unless otherwise documented below in the visit note. 

## 2015-04-29 DIAGNOSIS — E875 Hyperkalemia: Secondary | ICD-10-CM | POA: Diagnosis not present

## 2015-04-30 LAB — BASIC METABOLIC PANEL
BUN: 36 mg/dL — ABNORMAL HIGH (ref 7–25)
CALCIUM: 8.9 mg/dL (ref 8.6–10.3)
CO2: 32 mmol/L — ABNORMAL HIGH (ref 20–31)
Chloride: 95 mmol/L — ABNORMAL LOW (ref 98–110)
Creat: 1.26 mg/dL — ABNORMAL HIGH (ref 0.70–1.25)
Glucose, Bld: 263 mg/dL — ABNORMAL HIGH (ref 65–99)
Potassium: 4.7 mmol/L (ref 3.5–5.3)
SODIUM: 138 mmol/L (ref 135–146)

## 2015-05-24 ENCOUNTER — Other Ambulatory Visit: Payer: Self-pay | Admitting: Cardiology

## 2015-05-27 ENCOUNTER — Other Ambulatory Visit: Payer: Self-pay | Admitting: Cardiology

## 2015-09-11 ENCOUNTER — Encounter: Payer: Self-pay | Admitting: Cardiology

## 2015-09-13 ENCOUNTER — Other Ambulatory Visit: Payer: Self-pay | Admitting: Internal Medicine

## 2015-10-10 ENCOUNTER — Other Ambulatory Visit: Payer: Self-pay | Admitting: Cardiology

## 2015-10-14 ENCOUNTER — Other Ambulatory Visit: Payer: Self-pay | Admitting: Internal Medicine

## 2015-10-14 ENCOUNTER — Ambulatory Visit (INDEPENDENT_AMBULATORY_CARE_PROVIDER_SITE_OTHER): Payer: Medicare Other | Admitting: Internal Medicine

## 2015-10-14 ENCOUNTER — Telehealth: Payer: Self-pay

## 2015-10-14 ENCOUNTER — Encounter: Payer: Self-pay | Admitting: Internal Medicine

## 2015-10-14 ENCOUNTER — Other Ambulatory Visit (INDEPENDENT_AMBULATORY_CARE_PROVIDER_SITE_OTHER): Payer: Medicare Other

## 2015-10-14 VITALS — BP 138/82 | HR 94 | Temp 98.7°F | Resp 20 | Wt 298.0 lb

## 2015-10-14 DIAGNOSIS — I10 Essential (primary) hypertension: Secondary | ICD-10-CM | POA: Diagnosis not present

## 2015-10-14 DIAGNOSIS — E119 Type 2 diabetes mellitus without complications: Secondary | ICD-10-CM

## 2015-10-14 DIAGNOSIS — Z794 Long term (current) use of insulin: Secondary | ICD-10-CM

## 2015-10-14 DIAGNOSIS — E785 Hyperlipidemia, unspecified: Secondary | ICD-10-CM

## 2015-10-14 DIAGNOSIS — Z0000 Encounter for general adult medical examination without abnormal findings: Secondary | ICD-10-CM

## 2015-10-14 LAB — BASIC METABOLIC PANEL
BUN: 27 mg/dL — ABNORMAL HIGH (ref 6–23)
CALCIUM: 9.4 mg/dL (ref 8.4–10.5)
CO2: 35 mEq/L — ABNORMAL HIGH (ref 19–32)
Chloride: 99 mEq/L (ref 96–112)
Creatinine, Ser: 1.23 mg/dL (ref 0.40–1.50)
GFR: 61.86 mL/min (ref >60.00)
Glucose, Bld: 244 mg/dL — ABNORMAL HIGH (ref 70–99)
Potassium: 4.3 mEq/L (ref 3.5–5.1)
SODIUM: 144 meq/L (ref 135–145)

## 2015-10-14 LAB — HEPATIC FUNCTION PANEL
ALK PHOS: 79 U/L (ref 39–117)
ALT: 14 U/L (ref 0–53)
AST: 13 U/L (ref 0–37)
Albumin: 3.5 g/dL (ref 3.5–5.2)
Bilirubin, Direct: 0.1 mg/dL (ref 0.0–0.3)
Total Bilirubin: 0.7 mg/dL (ref 0.2–1.2)
Total Protein: 7.2 g/dL (ref 6.0–8.3)

## 2015-10-14 LAB — LIPID PANEL
Cholesterol: 108 mg/dL (ref 0–200)
HDL: 38.5 mg/dL — ABNORMAL LOW (ref >39.00)
LDL CALC: 39 mg/dL (ref 0–99)
NONHDL: 69.6
Total CHOL/HDL Ratio: 3
Triglycerides: 151 mg/dL — ABNORMAL HIGH (ref 0.0–149.0)
VLDL: 30.2 mg/dL (ref 0.0–40.0)

## 2015-10-14 LAB — HEMOGLOBIN A1C: HEMOGLOBIN A1C: 8.1 % — AB (ref 4.6–6.5)

## 2015-10-14 MED ORDER — GLIPIZIDE ER 10 MG PO TB24: 10.0000 mg | ORAL_TABLET | Freq: Every day | ORAL | Status: DC

## 2015-10-14 NOTE — Assessment & Plan Note (Signed)
stable overall by history and exam, recent data reviewed with pt, and pt to continue medical treatment as before,  to f/u any worsening symptoms or concerns Lab Results  Component Value Date   HGBA1C 7.1* 04/15/2015

## 2015-10-14 NOTE — Patient Instructions (Signed)
Please continue all other medications as before, and refills have been done if requested.  Please have the pharmacy call with any other refills you may need.  Please continue your efforts at being more active, low cholesterol diet, and weight control.  You are otherwise up to date with prevention measures today.  Please keep your appointments with your specialists as you may have planned  Please go to the LAB in the Basement (turn left off the elevator) for the tests to be done today  You will be contacted by phone if any changes need to be made immediately.  Otherwise, you will receive a letter about your results with an explanation, but please check with MyChart first.  Please return in 6 months, or sooner if needed 

## 2015-10-14 NOTE — Assessment & Plan Note (Signed)
stable overall by history and exam, recent data reviewed with pt, and pt to continue medical treatment as before,  to f/u any worsening symptoms or concerns Lab Results  Component Value Date   LDLCALC 52 04/15/2015

## 2015-10-14 NOTE — Assessment & Plan Note (Signed)
stable overall by history and exam, recent data reviewed with pt, and pt to continue medical treatment as before,  to f/u any worsening symptoms or concerns BP Readings from Last 3 Encounters:  10/14/15 138/82  04/15/15 114/78  03/14/15 110/80

## 2015-10-14 NOTE — Addendum Note (Signed)
Addended by: [name] on: 10/14/2015 10:07 AM   Modules accepted: Orders

## 2015-10-14 NOTE — Progress Notes (Signed)
Pre visit review using our clinic review tool, if applicable. No additional management support is needed unless otherwise documented below in the visit note. 

## 2015-10-14 NOTE — Progress Notes (Signed)
Subjective:    Patient ID: [name] [name], male    DOB: 20-Jul-1946, 70 y.o.   MRN: [id number]  HPI  Here to f/u; overall doing ok,  Pt denies chest pain, increasing sob or doe, wheezing, orthopnea, PND, increased LE swelling, palpitations, dizziness or syncope.  Pt denies new neurological symptoms such as new headache, or facial or extremity weakness or numbness.  Pt denies polydipsia, polyuria, or low sugar episode.   Pt denies new neurological symptoms such as new headache, or facial or extremity weakness or numbness.   Pt states overall good compliance with meds, mostly trying to follow appropriate diet, with wt overall stable,  but little exercise however. No new complaints Past Medical History  Diagnosis Date  . HYPERLIPIDEMIA   . GOUT   . Morbid obesity ([place])   . OBESITY HYPOVENTILATION SYNDROME     a. on home O2.  Marland Kitchen Unspecified hearing loss   . HYPERTENSION   . BRONCHITIS, CHRONIC 01/02/2009  . SKIN RASH 03/12/2010  . PROSTATE SPECIFIC ANTIGEN, ELEVATED 06/09/2007  . Long term (current) use of anticoagulants   . Complication of anesthesia     "aspiration pneumonia after hand OR" (01/05/2013)  . Permanent atrial fibrillation ([place])   . OSA (obstructive sleep apnea)   . DIABETES MELLITUS, TYPE II   . COMMON MIGRAINE     "none since treating for high BP"  . Arthritis     "knees and hands; thoracic area of the spine" (01/05/2013)  . On home oxygen therapy   . Acute right-sided CHF (congestive heart failure) ([place])     a. 01/2013.  . Pulmonary HTN ([place])     a. multifactorial including obstructive sleep apnea, obesity hypoventilation syndrome and possible pulmonary venous hypertension.   Past Surgical History  Procedure Laterality Date  . Appendectomy  1974  . Tendon repair Right 2009    "lacerated his tendon and pulley" Dr. Lenon Curt  . Cardioversion  05/21/2008; ~ 06/2008  . Inguinal hernia repair Right   . Hernia repair    . Umbilical hernia repair    . Right heart catheterization N/A  01/08/2013    Procedure: RIGHT HEART CATH;  Surgeon: Thayer Headings, MD;  Location: [facility] CATH LAB;  Service: Cardiovascular;  Laterality: N/A;    reports that he quit smoking about 37 years ago. His smoking use included Cigarettes. He has a 15 pack-year smoking history. He has never used smokeless tobacco. He reports that he drinks alcohol. He reports that he does not use illicit drugs. family history includes Alzheimer's disease in his father; Cancer in his father and other; Coronary artery disease in his other. Allergies  Allergen Reactions  . Codeine     MAKES HIM GOOFY  . Heparin     ? Abdominal rash 01/2013   Current Outpatient Prescriptions on File Prior to Visit  Medication Sig Dispense Refill  . albuterol (PROVENTIL HFA;VENTOLIN HFA) 108 (90 BASE) MCG/ACT inhaler Inhale 2 puffs into the lungs every 6 (six) hours as needed for wheezing. 1 Inhaler 11  . allopurinol (ZYLOPRIM) 100 MG tablet TAKE 1 TABLET DAILY 90 tablet 1  . atorvastatin (LIPITOR) 20 MG tablet TAKE 1 TABLET DAILY 90 tablet 2  . cetirizine (ZYRTEC) 10 MG tablet Take 10 mg by mouth at bedtime.    Marland Kitchen diltiazem (CARDIZEM CD) 180 MG 24 hr capsule Take 1 capsule (180 mg total) by mouth daily. 90 capsule 2  . ELIQUIS 5 MG TABS tablet TAKE 1 TABLET TWICE  A DAY 180 tablet 1  . furosemide (LASIX) 80 MG tablet Take 1.5 tablets (120 mg total) by mouth 2 (two) times daily. 270 tablet 2  . furosemide (LASIX) 80 MG tablet TAKE 1&1/2 TABLETS BY MOUTH TWICE A DAY 270 tablet 1  . glimepiride (AMARYL) 1 MG tablet TAKE 1 TABLET IN THE MORNING by mouth, then 1/2 tab in the PM 4535 tablet 3  . glucose blood (ONE TOUCH ULTRA TEST) test strip 1 each by Other route daily. Use as instructed Dx code E11.9 100 each 5  . metoprolol (LOPRESSOR) 50 MG tablet Take 1 tablet (50 mg total) by mouth 2 (two) times daily. 180 tablet 2  . potassium chloride SA (K-DUR,KLOR-CON) 20 MEQ tablet Take 1 tablet (20 mEq total) by mouth daily. 180 tablet 3  .  spironolactone (ALDACTONE) 25 MG tablet Take 1 tablet (25 mg total) by mouth daily. 90 tablet 3   No current facility-administered medications on file prior to visit.   Review of Systems  Constitutional: Negative for unusual diaphoresis or night sweats HENT: Negative for ringing in ear or discharge Eyes: Negative for double vision or worsening visual disturbance.  Respiratory: Negative for choking and stridor.   Gastrointestinal: Negative for vomiting or other signifcant bowel change Genitourinary: Negative for hematuria or change in urine volume.  Musculoskeletal: Negative for other MSK pain or swelling Skin: Negative for color change and worsening wound.  Neurological: Negative for tremors and numbness other than noted  Psychiatric/Behavioral: Negative for decreased concentration or agitation other than above       Objective:   Physical Exam BP 138/82 mmHg  Pulse 94  Temp(Src) 98.7 F (37.1 C) (Oral)  Resp 20  Wt 298 lb (135.172 kg)  SpO2 90% - on Home 02 4L VS noted,  Constitutional: Pt appears in no significant distress HENT: Head: NCAT.  Right Ear: External ear normal.  Left Ear: External ear normal.  Eyes: . Pupils are equal, round, and reactive to light. Conjunctivae and EOM are normal Neck: Normal range of motion. Neck supple.  Cardiovascular: Normal rate and regular rhythm.   Pulmonary/Chest: Effort normal and breath sounds decresaed bilat without rales or wheezing.  Neurological: Pt is alert. Not confused , motor grossly intact Skin: Skin is warm. No rash, + chronic 1+ bilat LE edema Psychiatric: Pt behavior is normal. No agitation.       Assessment & Plan:

## 2015-10-14 NOTE — Telephone Encounter (Signed)
A user error has taken place.

## 2015-12-22 ENCOUNTER — Telehealth: Payer: Self-pay | Admitting: Cardiology

## 2015-12-22 NOTE — Progress Notes (Signed)
Cardiology Office Note   Date:  12/23/2015   ID:  CUINN BECHEL, DOB 02-07-1946, MRN [id number]  PCP:  Cathlean Cower, MD  Cardiologist:  Dr. [name]    Chief Complaint  Patient presents with  . Edema    wt gain      History of Present Illness: [name] [name] is a 70 y.o. male who presents for SOB with wt gain of 20 lbs.  He increased lasix to 120 mg BID but only down 2 lbs.  Wt in Nov was 276 lbs but now 296. He has been on increased lasix for a least a week without change.  He denies any chest pain.  But SP02 at home has been to 88% with oxygen,  This is 24/7 with 3 L at rest, 4 L at night and now up to 5 L with exertion.    He has a hx of permanent a fib.  Hx R HF. Pu;mp,ary HTN, OSA, RHC 01/08/13 demonstrated elevated R heart pressures (RA 15, RV 44/11, PCWP 21, PA 41/30, CO 4.49, CI 1.8, PA sat 61%). He was transitioned from Coumadin to Apixaban 5 mg twice a day. Echo 5/14: Mild LVH, EF 55%, mod to severe LAE, mild RVE, mild reduced RVSF, mild RAE. Patient had some degree of renal insufficiency and hyperkalemia on a combination of ACE inhibitor and spironolactone. Medications adjusted (ACEI and spironolactone DCed and lasix reduced); K and renal function improved. Spironolactone resumed.    Past Medical History  Diagnosis Date  . HYPERLIPIDEMIA   . GOUT   . Morbid obesity ([place])   . OBESITY HYPOVENTILATION SYNDROME     a. on home O2.  Marland Kitchen Unspecified hearing loss   . HYPERTENSION   . BRONCHITIS, CHRONIC 01/02/2009  . SKIN RASH 03/12/2010  . PROSTATE SPECIFIC ANTIGEN, ELEVATED 06/09/2007  . Long term (current) use of anticoagulants   . Complication of anesthesia     "aspiration pneumonia after hand OR" (01/05/2013)  . Permanent atrial fibrillation ([place])   . OSA (obstructive sleep apnea)   . DIABETES MELLITUS, TYPE II   . COMMON MIGRAINE     "none since treating for high BP"  . Arthritis     "knees and hands; thoracic area of the spine" (01/05/2013)  . On home oxygen therapy   . Acute  right-sided CHF (congestive heart failure) ([place])     a. 01/2013.  . Pulmonary HTN ([place])     a. multifactorial including obstructive sleep apnea, obesity hypoventilation syndrome and possible pulmonary venous hypertension.    Past Surgical History  Procedure Laterality Date  . Appendectomy  1974  . Tendon repair Right 2009    "lacerated his tendon and pulley" Dr. Lenon Curt  . Cardioversion  05/21/2008; ~ 06/2008  . Inguinal hernia repair Right   . Hernia repair    . Umbilical hernia repair    . Right heart catheterization N/A 01/08/2013    Procedure: RIGHT HEART CATH;  Surgeon: Thayer Headings, MD;  Location: [facility] CATH LAB;  Service: Cardiovascular;  Laterality: N/A;     Current Outpatient Prescriptions  Medication Sig Dispense Refill  . allopurinol (ZYLOPRIM) 100 MG tablet TAKE 1 TABLET DAILY 90 tablet 1  . atorvastatin (LIPITOR) 20 MG tablet TAKE 1 TABLET DAILY 90 tablet 2  . cetirizine (ZYRTEC) 10 MG tablet Take 10 mg by mouth at bedtime.    Marland Kitchen diltiazem (CARDIZEM CD) 180 MG 24 hr capsule Take 1 capsule (180 mg total) by mouth daily. [place]  capsule 2  . ELIQUIS 5 MG TABS tablet TAKE 1 TABLET TWICE A DAY 180 tablet 1  . furosemide (LASIX) 80 MG tablet Take 1.5 tablets (120 mg total) by mouth 2 (two) times daily. 270 tablet 2  . glipiZIDE (GLIPIZIDE XL) 10 MG 24 hr tablet Take 1 tablet (10 mg total) by mouth daily with breakfast. 90 tablet 3  . glucose blood (ONE TOUCH ULTRA TEST) test strip 1 each by Other route daily. Use as instructed Dx code E11.9 100 each 5  . metoprolol (LOPRESSOR) 50 MG tablet Take 1 tablet (50 mg total) by mouth 2 (two) times daily. 180 tablet 2  . potassium chloride SA (K-DUR,KLOR-CON) 20 MEQ tablet Take 1 tablet (20 mEq total) by mouth daily. 180 tablet 3  . spironolactone (ALDACTONE) 25 MG tablet Take 1 tablet (25 mg total) by mouth daily. 90 tablet 3   No current facility-administered medications for this visit.    Allergies:   Codeine and Heparin    Social  History:  The patient  reports that he quit smoking about 37 years ago. His smoking use included Cigarettes. He has a 15 pack-year smoking history. He has never used smokeless tobacco. He reports that he drinks alcohol. He reports that he does not use illicit drugs.   Family History:  The patient's family history includes Alzheimer's disease in his father; Cancer in his father and other; Coronary artery disease in his other.    ROS:  General:no colds or fevers, + weight increase Skin:no rashes or ulcers HEENT:no blurred vision, no congestion CV:see HPI PUL:see HPI [id number] diarrhea  noconstipation or melena, no indigestion GU:no hematuria, no dysuria MS:no joint pain, no claudication Neuro:no syncope, 0cc lightheadedness Endo:+ diabetes saw PCP and meds adjusted, no thyroid disease  Wt Readings from Last 3 Encounters:  12/23/15 296 lb 9.6 oz (134.537 kg)  10/14/15 298 lb (135.172 kg)  04/15/15 281 lb (127.461 kg)     PHYSICAL EXAM: VS:  BP 122/74 mmHg  Pulse 64  Ht 5\' 8"  (1.727 m)  Wt 296 lb 9.6 oz (134.537 kg)  BMI 45.11 kg/m2  SpO2 90% , BMI Body mass index is 45.11 kg/(m^2). General:Pleasant affect, NAD Skin:Warm and dry, brisk capillary refill HEENT:normocephalic, sclera clear, mucus membranes moist Neck:supple, ? JVD thick neck, no bruits  Heart:S1S2 irreg irreg without murmur, gallup, rub or click Lungs:clear without rales, rhonchi, or wheezes [id number], non tender, + BS, do not palpate liver spleen or masses [id number] lower ext edema very tight on rt calf area of drainage, thighs 1-2+ edema, 2+ radial pulses Neuro:alert and oriented X 3, MAE, follows commands, + facial symmetry    EKG:  EKG is ordered today. The ekg ordered today demonstrates not done   Recent Labs: 04/15/2015: Hemoglobin 15.5; Platelets 260.0; TSH 1.53 10/14/2015: ALT 14; BUN 27*; Creatinine, Ser 1.23; Potassium 4.3; Sodium 144    Lipid Panel    Component Value Date/Time   CHOL 108 10/14/2015  1008   TRIG 151.0* 10/14/2015 1008   HDL 38.50* 10/14/2015 1008   CHOLHDL 3 10/14/2015 1008   VLDL 30.2 10/14/2015 1008   LDLCALC 39 10/14/2015 1008       Other studies Reviewed: Additional studies/ records that were reviewed today include:   Rt. Cath 2014 RA: 15 RV: 44/11 PCWP: 21 PA: 41/30  Cardiac Output  Thermodilution: 4.27 Index = 1.89 Fick : 4.49Index 1.8  Arterial Sat: 95 % (by pulse oxymetry) PA Sat: 61% .  Complications: No apparent complications  Patient did tolerate procedure well. . Echo 12/2012  Study Conclusions  - Left ventricle: Technically difficult study. The cavity size was normal. Wall thickness was increased in a pattern of mild LVH. The estimated ejection fraction was 55%. Regional wall motion abnormalities cannot be excluded. The study is not technically sufficient to allow evaluation of LV diastolic function. - Left atrium: The atrium was moderately to severely dilated. - Right ventricle: The cavity size was mildly dilated. Systolic function was mildly reduced. - Right atrium: The atrium was mildly dilated. - Pulmonary arteries: PA pressure can not be estimated.  Normal stress test 2009.   ASSESSMENT AND PLAN:  1.  Edema and wt gain pt pt and wife wt up 20 lbs over 6 months. Also with SOB, with decreasing in SP02. Discussed with Dr. Jacinta Shoe will admit to [facility] with [facility] and he will need CXR, tele, lasix 60 mg IV TID we will follow along. Labs.  Discussed with Dr. Marin Comment and pt will go to ER   2. A fib chronic on Eliquis continue eliquis  3. Chronic cor pulmonale- continue oxygen  4. HTN, essential controlled today.     Current medicines are reviewed with the patient today.  The patient Has no concerns regarding medicines.  The following changes have been made:  See above Labs/ tests ordered today include:see above  Disposition:   FU:  see  above  Signed, Cecilie Kicks, NP  12/23/2015 1:39 PM    [facility] [address], [place], [place], [place] Phone: [phone]; Fax: [phone]

## 2015-12-22 NOTE — Telephone Encounter (Signed)
Pt's dtr calling re weight gain over the last month-not sure how much-SOB-swelling abdomen- feet and ankles pls advise [phone]

## 2015-12-22 NOTE — Telephone Encounter (Signed)
Spoke with pt wife, she reports a weight gain of 20 lbs, she feels most of it is fluid. The pt is SOB with little exertion, denies orthopnea but sleeps in a recliner on regular basis. He is currently taking furosemide 120 mg bid, she increased for one week and the pt only lost 2 lbs by the wives report. She feels he needs to be seen but does not feel they need to go to the ER. appt with laura ingold np at the [place] office tomorrow. Patient wife voiced understanding of appt location and time.

## 2015-12-23 ENCOUNTER — Inpatient Hospital Stay (HOSPITAL_COMMUNITY)
Admission: EM | Admit: 2015-12-23 | Discharge: 2015-12-27 | DRG: 291 | Disposition: A | Discharge status: Home / Self Care | Payer: Medicare Other | Attending: Internal Medicine | Admitting: Internal Medicine

## 2015-12-23 ENCOUNTER — Encounter: Payer: Self-pay | Admitting: Cardiology

## 2015-12-23 ENCOUNTER — Ambulatory Visit (INDEPENDENT_AMBULATORY_CARE_PROVIDER_SITE_OTHER): Payer: Medicare Other | Admitting: Cardiology

## 2015-12-23 ENCOUNTER — Encounter (HOSPITAL_COMMUNITY): Payer: Self-pay

## 2015-12-23 ENCOUNTER — Emergency Department (HOSPITAL_COMMUNITY): Payer: Medicare Other

## 2015-12-23 VITALS — BP 122/74 | HR 64 | Ht 68.0 in | Wt 296.6 lb

## 2015-12-23 DIAGNOSIS — Z6841 Body Mass Index (BMI) 40.0 and over, adult: Secondary | ICD-10-CM

## 2015-12-23 DIAGNOSIS — I5033 Acute on chronic diastolic (congestive) heart failure: Secondary | ICD-10-CM | POA: Diagnosis not present

## 2015-12-23 DIAGNOSIS — E662 Morbid (severe) obesity with alveolar hypoventilation: Secondary | ICD-10-CM | POA: Diagnosis present

## 2015-12-23 DIAGNOSIS — I482 Chronic atrial fibrillation, unspecified: Secondary | ICD-10-CM

## 2015-12-23 DIAGNOSIS — I4821 Permanent atrial fibrillation: Secondary | ICD-10-CM | POA: Diagnosis present

## 2015-12-23 DIAGNOSIS — Z7901 Long term (current) use of anticoagulants: Secondary | ICD-10-CM | POA: Diagnosis not present

## 2015-12-23 DIAGNOSIS — M1000 Idiopathic gout, unspecified site: Secondary | ICD-10-CM

## 2015-12-23 DIAGNOSIS — I272 Other secondary pulmonary hypertension: Secondary | ICD-10-CM | POA: Diagnosis present

## 2015-12-23 DIAGNOSIS — Z9981 Dependence on supplemental oxygen: Secondary | ICD-10-CM | POA: Diagnosis not present

## 2015-12-23 DIAGNOSIS — I50811 Acute right heart failure: Secondary | ICD-10-CM

## 2015-12-23 DIAGNOSIS — Z820 Family history of epilepsy and other diseases of the nervous system: Secondary | ICD-10-CM | POA: Diagnosis not present

## 2015-12-23 DIAGNOSIS — I10 Essential (primary) hypertension: Secondary | ICD-10-CM | POA: Diagnosis not present

## 2015-12-23 DIAGNOSIS — E785 Hyperlipidemia, unspecified: Secondary | ICD-10-CM | POA: Diagnosis not present

## 2015-12-23 DIAGNOSIS — M109 Gout, unspecified: Secondary | ICD-10-CM | POA: Diagnosis present

## 2015-12-23 DIAGNOSIS — Z8042 Family history of malignant neoplasm of prostate: Secondary | ICD-10-CM

## 2015-12-23 DIAGNOSIS — E8779 Other fluid overload: Secondary | ICD-10-CM | POA: Diagnosis not present

## 2015-12-23 DIAGNOSIS — E119 Type 2 diabetes mellitus without complications: Secondary | ICD-10-CM | POA: Diagnosis present

## 2015-12-23 DIAGNOSIS — D72829 Elevated white blood cell count, unspecified: Secondary | ICD-10-CM | POA: Diagnosis present

## 2015-12-23 DIAGNOSIS — Z8249 Family history of ischemic heart disease and other diseases of the circulatory system: Secondary | ICD-10-CM | POA: Diagnosis not present

## 2015-12-23 DIAGNOSIS — E0800 Diabetes mellitus due to underlying condition with hyperosmolarity without nonketotic hyperglycemic-hyperosmolar coma (NKHHC): Secondary | ICD-10-CM

## 2015-12-23 DIAGNOSIS — J9621 Acute and chronic respiratory failure with hypoxia: Secondary | ICD-10-CM | POA: Diagnosis present

## 2015-12-23 DIAGNOSIS — Z7984 Long term (current) use of oral hypoglycemic drugs: Secondary | ICD-10-CM

## 2015-12-23 DIAGNOSIS — H9190 Unspecified hearing loss, unspecified ear: Secondary | ICD-10-CM | POA: Diagnosis present

## 2015-12-23 DIAGNOSIS — Z87891 Personal history of nicotine dependence: Secondary | ICD-10-CM

## 2015-12-23 DIAGNOSIS — G4733 Obstructive sleep apnea (adult) (pediatric): Secondary | ICD-10-CM | POA: Diagnosis present

## 2015-12-23 DIAGNOSIS — I509 Heart failure, unspecified: Secondary | ICD-10-CM

## 2015-12-23 DIAGNOSIS — I110 Hypertensive heart disease with heart failure: Principal | ICD-10-CM | POA: Diagnosis present

## 2015-12-23 DIAGNOSIS — R0600 Dyspnea, unspecified: Secondary | ICD-10-CM | POA: Diagnosis not present

## 2015-12-23 DIAGNOSIS — I2781 Cor pulmonale (chronic): Secondary | ICD-10-CM

## 2015-12-23 DIAGNOSIS — I4891 Unspecified atrial fibrillation: Secondary | ICD-10-CM | POA: Diagnosis not present

## 2015-12-23 DIAGNOSIS — R0602 Shortness of breath: Secondary | ICD-10-CM | POA: Diagnosis not present

## 2015-12-23 DIAGNOSIS — I5031 Acute diastolic (congestive) heart failure: Secondary | ICD-10-CM | POA: Diagnosis not present

## 2015-12-23 DIAGNOSIS — E8770 Fluid overload, unspecified: Secondary | ICD-10-CM | POA: Diagnosis present

## 2015-12-23 LAB — CBC WITH DIFFERENTIAL/PLATELET
Basophils Absolute: 0 10*3/uL (ref 0.0–0.1)
Basophils Relative: 0 %
EOS PCT: 2 %
Eosinophils Absolute: 0.2 10*3/uL (ref 0.0–0.7)
HEMATOCRIT: 51 % (ref 39.0–52.0)
Hemoglobin: 15.2 g/dL (ref 13.0–17.0)
LYMPHS ABS: 2.5 10*3/uL (ref 0.7–4.0)
LYMPHS PCT: 20 %
MCH: 28.4 pg (ref 26.0–34.0)
MCHC: 29.8 g/dL — ABNORMAL LOW (ref 30.0–36.0)
MCV: 95.3 fL (ref 78.0–100.0)
Monocytes Absolute: 1.2 10*3/uL — ABNORMAL HIGH (ref 0.1–1.0)
Monocytes Relative: 9 %
Neutro Abs: 8.7 10*3/uL — ABNORMAL HIGH (ref 1.7–7.7)
Neutrophils Relative %: 69 %
PLATELETS: 261 10*3/uL (ref 150–400)
RBC: 5.35 MIL/uL (ref 4.22–5.81)
RDW: 15.2 % (ref 11.5–15.5)
WBC: 12.6 10*3/uL — AB (ref 4.0–10.5)

## 2015-12-23 LAB — HEPATIC FUNCTION PANEL
ALBUMIN: 3.2 g/dL — AB (ref 3.5–5.0)
ALK PHOS: 74 U/L (ref 38–126)
ALT: 14 U/L — AB (ref 17–63)
AST: 13 U/L — ABNORMAL LOW (ref 15–41)
Bilirubin, Direct: 0.2 mg/dL (ref 0.1–0.5)
Indirect Bilirubin: 0.6 mg/dL (ref 0.3–0.9)
TOTAL PROTEIN: 6.9 g/dL (ref 6.5–8.1)
Total Bilirubin: 0.8 mg/dL (ref 0.3–1.2)

## 2015-12-23 LAB — URINALYSIS, ROUTINE W REFLEX MICROSCOPIC
Bilirubin Urine: NEGATIVE
GLUCOSE, UA: NEGATIVE mg/dL
Hgb urine dipstick: NEGATIVE
KETONES UR: NEGATIVE mg/dL
LEUKOCYTES UA: NEGATIVE
Nitrite: NEGATIVE
PH: 6.5 (ref 5.0–8.0)
Protein, ur: NEGATIVE mg/dL
SPECIFIC GRAVITY, URINE: 1.01 (ref 1.005–1.030)

## 2015-12-23 LAB — BASIC METABOLIC PANEL
Anion gap: 8 (ref 5–15)
BUN: 18 mg/dL (ref 6–20)
CHLORIDE: 93 mmol/L — AB (ref 101–111)
CO2: 39 mmol/L — ABNORMAL HIGH (ref 22–32)
CREATININE: 1.03 mg/dL (ref 0.61–1.24)
Calcium: 9 mg/dL (ref 8.9–10.3)
GFR calc non Af Amer: >60 mL/min (ref >60)
Glucose, Bld: 126 mg/dL — ABNORMAL HIGH (ref 65–99)
POTASSIUM: 3.7 mmol/L (ref 3.5–5.1)
SODIUM: 140 mmol/L (ref 135–145)

## 2015-12-23 LAB — BRAIN NATRIURETIC PEPTIDE: B Natriuretic Peptide: 197 pg/mL — ABNORMAL HIGH (ref 0.0–100.0)

## 2015-12-23 LAB — GLUCOSE, CAPILLARY: Glucose-Capillary: 184 mg/dL — ABNORMAL HIGH (ref 65–99)

## 2015-12-23 LAB — TROPONIN I: Troponin I: <0.03 ng/mL (ref <0.031)

## 2015-12-23 LAB — MAGNESIUM: Magnesium: 1.8 mg/dL (ref 1.7–2.4)

## 2015-12-23 MED ORDER — DILTIAZEM HCL ER COATED BEADS 180 MG PO CP24
180.0000 mg | ORAL_CAPSULE | Freq: Every day | ORAL | Status: DC
  Administered 2015-12-24 – 2015-12-27 (×4): 180 mg via ORAL
  Filled 2015-12-23 (×4): qty 1

## 2015-12-23 MED ORDER — INSULIN ASPART 100 UNIT/ML ~~LOC~~ SOLN
0.0000 [IU] | Freq: Three times a day (TID) | SUBCUTANEOUS | Status: DC
  Administered 2015-12-24: 1 [IU] via SUBCUTANEOUS
  Administered 2015-12-24 – 2015-12-25 (×4): 2 [IU] via SUBCUTANEOUS
  Administered 2015-12-25: 1 [IU] via SUBCUTANEOUS
  Administered 2015-12-26 (×2): 2 [IU] via SUBCUTANEOUS
  Administered 2015-12-27 (×2): 1 [IU] via SUBCUTANEOUS

## 2015-12-23 MED ORDER — IPRATROPIUM-ALBUTEROL 0.5-2.5 (3) MG/3ML IN SOLN: 3.0000 mL | RESPIRATORY_TRACT | Status: DC | PRN

## 2015-12-23 MED ORDER — SODIUM CHLORIDE 0.9% FLUSH: 3.0000 mL | INTRAVENOUS | Status: DC | PRN

## 2015-12-23 MED ORDER — ATORVASTATIN CALCIUM 20 MG PO TABS
20.0000 mg | ORAL_TABLET | Freq: Every day | ORAL | Status: DC
  Administered 2015-12-23 – 2015-12-26 (×4): 20 mg via ORAL
  Filled 2015-12-23 (×5): qty 1

## 2015-12-23 MED ORDER — SODIUM CHLORIDE 0.9% FLUSH
3.0000 mL | Freq: Two times a day (BID) | INTRAVENOUS | Status: DC
  Administered 2015-12-23 – 2015-12-27 (×8): 3 mL via INTRAVENOUS

## 2015-12-23 MED ORDER — ALLOPURINOL 100 MG PO TABS
100.0000 mg | ORAL_TABLET | Freq: Every day | ORAL | Status: DC
  Administered 2015-12-24 – 2015-12-27 (×4): 100 mg via ORAL
  Filled 2015-12-23 (×4): qty 1

## 2015-12-23 MED ORDER — APIXABAN 5 MG PO TABS
5.0000 mg | ORAL_TABLET | Freq: Two times a day (BID) | ORAL | Status: DC
  Administered 2015-12-23 – 2015-12-27 (×8): 5 mg via ORAL
  Filled 2015-12-23 (×8): qty 1

## 2015-12-23 MED ORDER — ONDANSETRON HCL 4 MG/2ML IJ SOLN: 4.0000 mg | Freq: Four times a day (QID) | INTRAMUSCULAR | Status: DC | PRN

## 2015-12-23 MED ORDER — SPIRONOLACTONE 25 MG PO TABS
25.0000 mg | ORAL_TABLET | Freq: Every day | ORAL | Status: DC
  Administered 2015-12-23 – 2015-12-27 (×5): 25 mg via ORAL
  Filled 2015-12-23 (×5): qty 1

## 2015-12-23 MED ORDER — POTASSIUM CHLORIDE CRYS ER 20 MEQ PO TBCR
20.0000 meq | EXTENDED_RELEASE_TABLET | Freq: Two times a day (BID) | ORAL | Status: DC
  Administered 2015-12-24 – 2015-12-27 (×7): 20 meq via ORAL
  Filled 2015-12-23 (×8): qty 1

## 2015-12-23 MED ORDER — SODIUM CHLORIDE 0.9 % IV SOLN: 250.0000 mL | INTRAVENOUS | Status: DC | PRN

## 2015-12-23 MED ORDER — ACETAMINOPHEN 325 MG PO TABS: 650.0000 mg | ORAL_TABLET | ORAL | Status: DC | PRN

## 2015-12-23 MED ORDER — METOPROLOL TARTRATE 50 MG PO TABS
50.0000 mg | ORAL_TABLET | Freq: Two times a day (BID) | ORAL | Status: DC
  Administered 2015-12-23 – 2015-12-24 (×2): 50 mg via ORAL
  Filled 2015-12-23 (×2): qty 1

## 2015-12-23 MED ORDER — FUROSEMIDE 10 MG/ML IJ SOLN
60.0000 mg | Freq: Three times a day (TID) | INTRAMUSCULAR | Status: DC
  Administered 2015-12-23 – 2015-12-27 (×12): 60 mg via INTRAVENOUS
  Filled 2015-12-23 (×13): qty 6

## 2015-12-23 MED ORDER — LORATADINE 10 MG PO TABS
10.0000 mg | ORAL_TABLET | Freq: Every day | ORAL | Status: DC
  Administered 2015-12-23 – 2015-12-27 (×4): 10 mg via ORAL
  Filled 2015-12-23 (×5): qty 1

## 2015-12-23 MED ORDER — SODIUM CHLORIDE 0.9 % IV SOLN: INTRAVENOUS | Status: DC

## 2015-12-23 NOTE — ED Provider Notes (Signed)
CSN: [id number]     Arrival date & time 12/23/15  1455 History   First MD Initiated Contact with Patient 12/23/15 1512     Chief Complaint  Patient presents with  . Shortness of Breath     (Consider location/radiation/quality/duration/timing/severity/associated sxs/prior Treatment) HPI   [name] [name] is a 70 y.o. male who presents for evaluation of shortness of breath, gradual onset, associated with a 20 pound weight gain over several months time. He has had more trouble in the last 2 weeks, especially with his breathing. Because of that, he went to his cardiologist office today, they recommended that he come here to be admitted. He is currently taking Lasix 120 mg, twice a day. He has had decreased appetite recently, "because of the shortness of breath". He denies chest pain, headache, back pain, or dizziness. There are no other known modifying factors.   Past Medical History  Diagnosis Date  . HYPERLIPIDEMIA   . GOUT   . Morbid obesity ([place])   . OBESITY HYPOVENTILATION SYNDROME     a. on home O2.  Marland Kitchen Unspecified hearing loss   . HYPERTENSION   . BRONCHITIS, CHRONIC 01/02/2009  . SKIN RASH 03/12/2010  . PROSTATE SPECIFIC ANTIGEN, ELEVATED 06/09/2007  . Long term (current) use of anticoagulants   . Complication of anesthesia     "aspiration pneumonia after hand OR" (01/05/2013)  . Permanent atrial fibrillation ([place])   . OSA (obstructive sleep apnea)   . DIABETES MELLITUS, TYPE II   . COMMON MIGRAINE     "none since treating for high BP"  . Arthritis     "knees and hands; thoracic area of the spine" (01/05/2013)  . On home oxygen therapy   . Acute right-sided CHF (congestive heart failure) ([place])     a. 01/2013.  . Pulmonary HTN ([place])     a. multifactorial including obstructive sleep apnea, obesity hypoventilation syndrome and possible pulmonary venous hypertension.   Past Surgical History  Procedure Laterality Date  . Appendectomy  1974  . Tendon repair Right 2009    "lacerated  his tendon and pulley" Dr. Lenon Curt  . Cardioversion  05/21/2008; ~ 06/2008  . Inguinal hernia repair Right   . Hernia repair    . Umbilical hernia repair    . Right heart catheterization N/A 01/08/2013    Procedure: RIGHT HEART CATH;  Surgeon: Thayer Headings, MD;  Location: [facility] CATH LAB;  Service: Cardiovascular;  Laterality: N/A;   Family History  Problem Relation Age of Onset  . Alzheimer's disease Father   . Cancer Father     Prostate Cancer  . Cancer Other     Breast Cancer, <50 yo 1st degree relative  . Coronary artery disease Other     Male, 1st degree relative   Social History  Substance Use Topics  . Smoking status: Former Smoker -- 1.00 packs/day for 15 years    Types: Cigarettes    Quit date: 08/09/1978  . Smokeless tobacco: Never Used     Comment: 01/05/2013 "quit smoking ~ 40 years ago"  . Alcohol Use: 0.0 oz/week    0 Standard drinks or equivalent per week     Comment: 01/05/2013 "hasn't had a drink since 1980's; never had problem w/it"    Review of Systems  All other systems reviewed and are negative.     Allergies  Codeine and Heparin  Home Medications   Prior to Admission medications   Medication Sig Start Date End Date Taking? Authorizing Provider  allopurinol (ZYLOPRIM) 100 MG tablet TAKE 1 TABLET DAILY 09/15/15  Yes [name], MD  atorvastatin (LIPITOR) 20 MG tablet TAKE 1 TABLET DAILY 04/15/15  Yes Lelon Perla, MD  cetirizine (ZYRTEC) 10 MG tablet Take 10 mg by mouth at bedtime.   Yes Historical Provider, MD  diltiazem (CARDIZEM CD) 180 MG 24 hr capsule Take 1 capsule (180 mg total) by mouth daily. 05/27/15  Yes Lelon Perla, MD  ELIQUIS 5 MG TABS tablet TAKE 1 TABLET TWICE A DAY 10/13/15  Yes Lelon Perla, MD  furosemide (LASIX) 80 MG tablet Take 1.5 tablets (120 mg total) by mouth 2 (two) times daily. 03/03/15  Yes Lelon Perla, MD  glipiZIDE (GLIPIZIDE XL) 10 MG 24 hr tablet Take 1 tablet (10 mg total) by mouth daily with breakfast.  10/14/15  Yes [name], MD  metoprolol (LOPRESSOR) 50 MG tablet Take 1 tablet (50 mg total) by mouth 2 (two) times daily. 05/27/15  Yes Lelon Perla, MD  OXYGEN Inhale 3-5 L into the lungs daily. 3L Daily  4L Resting  5L Exertion   Yes Historical Provider, MD  potassium chloride SA (K-DUR,KLOR-CON) 20 MEQ tablet Take 1 tablet (20 mEq total) by mouth daily. 04/02/15  Yes Lelon Perla, MD  spironolactone (ALDACTONE) 25 MG tablet Take 1 tablet (25 mg total) by mouth daily. 07/24/14  Yes Lelon Perla, MD  glucose blood (ONE TOUCH ULTRA TEST) test strip 1 each by Other route daily. Use as instructed Dx code E11.9 02/27/15   [name], MD   BP 124/84 mmHg  Pulse 79  Temp(Src) 98.2 F (36.8 C) (Oral)  Resp 23  Ht 5\' 8"  (1.727 m)  Wt 296 lb 9.6 oz (134.537 kg)  BMI 45.11 kg/m2  SpO2 94% Physical Exam  Constitutional: He is oriented to person, place, and time. He appears well-developed. No distress.  Obese, elderly  HENT:  Head: Normocephalic and atraumatic.  Right Ear: External ear normal.  Left Ear: External ear normal.  Eyes: Conjunctivae and EOM are normal. Pupils are equal, round, and reactive to light.  Neck: Normal range of motion and phonation normal. Neck supple.  Cardiovascular: Normal rate, regular rhythm and normal heart sounds.   Pulmonary/Chest: Effort normal and breath sounds normal. He exhibits no bony tenderness.  Abdominal: Soft. There is no tenderness.  Musculoskeletal: Normal range of motion.  Neurological: He is alert and oriented to person, place, and time. No cranial nerve deficit or sensory deficit. He exhibits normal muscle tone. Coordination normal.  Skin: Skin is warm, dry and intact.  Psychiatric: He has a normal mood and affect. His behavior is normal. Judgment and thought content normal.  Nursing note and vitals reviewed.   ED Course  Procedures (including critical care time)  Medications  furosemide (LASIX) injection 60 mg (60 mg  Intravenous Given 12/23/15 1535)    Patient Vitals for the past 24 hrs:  BP Temp Temp src Pulse Resp SpO2 Height Weight  12/23/15 1700 124/84 mmHg - - 79 23 94 % - -  12/23/15 1630 140/92 mmHg - - - - - - -  12/23/15 1618 132/86 mmHg - - 87 20 96 % - -  12/23/15 1600 132/86 mmHg - - 95 - 94 % - -  12/23/15 1530 113/66 mmHg - - 84 24 95 % - -  12/23/15 1510 113/66 mmHg 98.2 F (36.8 C) Oral 90 16 97 % 5\' 8"  (1.727 m) 296 lb 9.6  oz (134.537 kg)  12/23/15 1509 - - - - - - 5\' 8"  (1.727 m) 296 lb (134.265 kg)    4:57 PM Reevaluation with update and discussion. After initial assessment and treatment, an updated evaluation reveals No additional complaints. Family updated on findings, all questions answered. Amaris Delafuente L    4:57 PM-Consult complete with Hospitalist. Patient case explained and discussed. He agrees to admit patient for further evaluation and treatment. Call ended at 17:20  Labs Review Labs Reviewed  BASIC METABOLIC PANEL - Abnormal; Notable for the following:    Chloride 93 (*)    CO2 39 (*)    Glucose, Bld 126 (*)    All other components within normal limits  HEPATIC FUNCTION PANEL - Abnormal; Notable for the following:    Albumin 3.2 (*)    AST 13 (*)    ALT 14 (*)    All other components within normal limits  CBC WITH DIFFERENTIAL/PLATELET - Abnormal; Notable for the following:    WBC 12.6 (*)    MCHC 29.8 (*)    Neutro Abs 8.7 (*)    Monocytes Absolute 1.2 (*)    All other components within normal limits  BRAIN NATRIURETIC PEPTIDE - Abnormal; Notable for the following:    B Natriuretic Peptide 197.0 (*)    All other components within normal limits  URINE CULTURE  URINALYSIS, ROUTINE W REFLEX MICROSCOPIC (NOT AT [facility])  TROPONIN I    Imaging Review Dg Chest 2 View  12/23/2015  CLINICAL DATA:  Shortness of breath.  History of CHF. EXAM: CHEST  2 VIEW COMPARISON:  01/06/2013 FINDINGS: Again noted is enlargement of the cardiac silhouette. There is no evidence  for pulmonary edema or focal airspace disease. No large pleural effusions. Degenerative changes in the thoracic spine. No acute bone abnormality. IMPRESSION: No acute chest findings.  No evidence for pulmonary edema. Electronically Signed   By: Markus Daft M.D.   On: 12/23/2015 16:15   I have personally reviewed and evaluated these images and lab results as part of my medical decision-making.   EKG Interpretation   Date/Time:  Tuesday Dec 23 2015 15:09:46 EDT Ventricular Rate:  94 PR Interval:    QRS Duration: 93 QT Interval:  402 QTC Calculation: 503 R Axis:   109 Text Interpretation:  Atrial fibrillation Right axis deviation Low  voltage, precordial leads Anteroseptal infarct, old Prolonged QT interval  Since last tracing QT has lengthened Confirmed by Eulis Foster  MD, Arlin Sass  [id number]) on 12/23/2015 4:15:00 PM      MDM   Final diagnoses:  Acute on chronic congestive heart failure, unspecified congestive heart failure type (HCC)    CHF exacerbation. Patient is debilitated, and has decreased appetite, and despite that is gaining weight. BMP and chest x-ray do not indicate failure. Patient will require admission for further testing and evaluation. He may benefit from PT evaluation. He is having persistent shortness of breath, he feels somewhat worse, despite using his oxygen as directed.  Nursing Notes Reviewed/ Care Coordinated, and agree without changes. Applicable Imaging Reviewed.  Interpretation of Laboratory Data incorporated into ED treatment  Plan: Admit    Daleen Bo, MD 12/23/15 1727

## 2015-12-23 NOTE — H&P (Signed)
History and Physical    [name] [name] [id number] DOB: Dec 19, 1945 DOA: 12/23/2015  PCP: Cathlean Cower, MD   Patient coming from: Home   Chief Complaint: Dyspnea, weight gain, swelling  HPI: [name] [name] is a 70 y.o. male with medical history significant for permanent atrial fibrillation on Eliquis, hypertension, type 2 diabetes mellitus, obesity with OHS, pulmonary hypertension, OSA on CPAP, and chronic supplemental O2 requirement who presents to the ED at the direction of his cardiologist for evaluation of worsening dyspnea with lower extremity edema and weight gain. Patient reports a recent weight gain of 20 pounds with progressive edema in the bilateral lower extremities and increasing supplemental O2 requirement. Patient had been in his usual state of health until the insidious development of the aforementioned symptoms over the past month. He reports chronic bilateral lower extremity swelling distally, but notes that the edema has increased and is now into his thighs and abdomen. He denies orthopnea per se, but regularly sleeps in a recliner. He uses 3-5 L/m of supplemental oxygen around the clock but his O2 saturations have remained in the mid to upper 80s despite this. He was evaluated and his cardiologist's clinic this afternoon, suspected to be in acute volume overload, and directed to the emergency department for further evaluation and management.  ED Course: Upon arrival to the ED, patient is found to be afebrile, saturating mid 90s on 4 L/m O2, and with vital signs otherwise stable. EKG features atrial fibrillation with right axis deviation, low-voltage QRS, QTC of 503 ms, and septal Q-wave. Chest x-ray is negative for acute cardiopulmonary disease. CBC features a leukocytosis to 12,600. Troponin is undetectable and BNP is elevated to 197. Urine is obtained and negative for signs of infection. CMP features a serum bicarbonate of 39 and mild hypochloremia of 93. Patient was given Lasix 60 mg IV and  began to diurese. He remained hemodynamically stable and will be admitted to the hospital for ongoing evaluation and management of increasing supplemental O2 requirement with dyspnea and swelling, suspected secondary to acute volume overload.  Review of Systems:  All other systems reviewed and apart from HPI, are negative.  Past Medical History  Diagnosis Date  . HYPERLIPIDEMIA   . GOUT   . Morbid obesity ([place])   . OBESITY HYPOVENTILATION SYNDROME     a. on home O2.  Marland Kitchen Unspecified hearing loss   . HYPERTENSION   . BRONCHITIS, CHRONIC 01/02/2009  . SKIN RASH 03/12/2010  . PROSTATE SPECIFIC ANTIGEN, ELEVATED 06/09/2007  . Long term (current) use of anticoagulants   . Complication of anesthesia     "aspiration pneumonia after hand OR" (01/05/2013)  . Permanent atrial fibrillation ([place])   . OSA (obstructive sleep apnea)   . DIABETES MELLITUS, TYPE II   . COMMON MIGRAINE     "none since treating for high BP"  . Arthritis     "knees and hands; thoracic area of the spine" (01/05/2013)  . On home oxygen therapy   . Acute right-sided CHF (congestive heart failure) ([place])     a. 01/2013.  . Pulmonary HTN ([place])     a. multifactorial including obstructive sleep apnea, obesity hypoventilation syndrome and possible pulmonary venous hypertension.    Past Surgical History  Procedure Laterality Date  . Appendectomy  1974  . Tendon repair Right 2009    "lacerated his tendon and pulley" Dr. Lenon Curt  . Cardioversion  05/21/2008; ~ 06/2008  . Inguinal hernia repair Right   . Hernia repair    .  Umbilical hernia repair    . Right heart catheterization N/A 01/08/2013    Procedure: RIGHT HEART CATH;  Surgeon: Thayer Headings, MD;  Location: [facility] CATH LAB;  Service: Cardiovascular;  Laterality: N/A;     reports that he quit smoking about 37 years ago. His smoking use included Cigarettes. He has a 15 pack-year smoking history. He has never used smokeless tobacco. He reports that he drinks alcohol. He reports  that he does not use illicit drugs.  Allergies  Allergen Reactions  . Codeine     Altered mental status "goofy"  . Heparin Rash     Abdominal rash 01/2013    Family History  Problem Relation Age of Onset  . Alzheimer's disease Father   . Cancer Father     Prostate Cancer  . Cancer Other     Breast Cancer, <50 yo 1st degree relative  . Coronary artery disease Other     Male, 1st degree relative     Prior to Admission medications   Medication Sig Start Date End Date Taking? Authorizing Provider  allopurinol (ZYLOPRIM) 100 MG tablet TAKE 1 TABLET DAILY 09/15/15  Yes [name], MD  atorvastatin (LIPITOR) 20 MG tablet TAKE 1 TABLET DAILY 04/15/15  Yes Lelon Perla, MD  cetirizine (ZYRTEC) 10 MG tablet Take 10 mg by mouth at bedtime.   Yes Historical Provider, MD  diltiazem (CARDIZEM CD) 180 MG 24 hr capsule Take 1 capsule (180 mg total) by mouth daily. 05/27/15  Yes Lelon Perla, MD  ELIQUIS 5 MG TABS tablet TAKE 1 TABLET TWICE A DAY 10/13/15  Yes Lelon Perla, MD  furosemide (LASIX) 80 MG tablet Take 1.5 tablets (120 mg total) by mouth 2 (two) times daily. 03/03/15  Yes Lelon Perla, MD  glipiZIDE (GLIPIZIDE XL) 10 MG 24 hr tablet Take 1 tablet (10 mg total) by mouth daily with breakfast. 10/14/15  Yes [name], MD  metoprolol (LOPRESSOR) 50 MG tablet Take 1 tablet (50 mg total) by mouth 2 (two) times daily. 05/27/15  Yes Lelon Perla, MD  OXYGEN Inhale 3-5 L into the lungs daily. 3L Daily  4L Resting  5L Exertion   Yes Historical Provider, MD  potassium chloride SA (K-DUR,KLOR-CON) 20 MEQ tablet Take 1 tablet (20 mEq total) by mouth daily. 04/02/15  Yes Lelon Perla, MD  spironolactone (ALDACTONE) 25 MG tablet Take 1 tablet (25 mg total) by mouth daily. 07/24/14  Yes Lelon Perla, MD  glucose blood (ONE TOUCH ULTRA TEST) test strip 1 each by Other route daily. Use as instructed Dx code E11.9 02/27/15   [name], MD    Physical Exam: Filed Vitals:    12/23/15 1730 12/23/15 1745 12/23/15 1800 12/23/15 1838  BP: 124/86  126/91 129/83  Pulse: 81 55 77 92  Temp:    98 F (36.7 C)  TempSrc:    Oral  Resp: 27 19  18   Height:    5\' 8"  (1.727 m)  Weight:    113.127 kg (249 lb 6.4 oz)  SpO2: 95% 95% 95%       Constitutional: NAD, calm, comfortable Eyes: PERTLA, lids and conjunctivae normal ENMT: Mucous membranes are moist. Posterior pharynx clear of any exudate or lesions.   Neck: normal, supple, no masses, no thyromegaly Respiratory: clear to auscultation bilaterally, no wheezing. Increased respiratory effort with accessory muscle use.  Cardiovascular: Rate ~90 and irregluar, no significant murmurs / rubs / gallops. Bilateral LE edema  to proximal thighs. Abdomen: Mild distension, no tenderness, no masses palpated. Bowel sounds normal.  Musculoskeletal: no clubbing / cyanosis. No joint deformity upper and lower extremities.    Skin: no significant rashes, lesions, ulcers. Warm, dry, well-perfused. Neurologic: CN 2-12 grossly intact. Sensation intact, DTR normal. Strength 5/5 in all 4 limbs.  Psychiatric: Normal judgment and insight. Alert and oriented x 3. Normal mood and affect.     Labs on Admission: I have personally reviewed following labs and imaging studies  CBC:  Recent Labs Lab 12/23/15 1531  WBC 12.6*  NEUTROABS 8.7*  HGB 15.2  HCT 51.0  MCV 95.3  PLT 0000000   Basic Metabolic Panel:  Recent Labs Lab 12/23/15 1531  NA 140  K 3.7  CL 93*  CO2 39*  GLUCOSE 126*  BUN 18  CREATININE 1.03  CALCIUM 9.0   GFR: Estimated Creatinine Clearance: 81.5 mL/min (by C-G formula based on Cr of 1.03). Liver Function Tests:  Recent Labs Lab 12/23/15 1531  AST 13*  ALT 14*  ALKPHOS 74  BILITOT 0.8  PROT 6.9  ALBUMIN 3.2*   No results for input(s): LIPASE, AMYLASE in the last 168 hours. No results for input(s): AMMONIA in the last 168 hours. Coagulation Profile: No results for input(s): INR, PROTIME in the last 168  hours. Cardiac Enzymes:  Recent Labs Lab 12/23/15 1531  TROPONINI <0.03   BNP (last 3 results) No results for input(s): PROBNP in the last 8760 hours. HbA1C: No results for input(s): HGBA1C in the last 72 hours. CBG: No results for input(s): GLUCAP in the last 168 hours. Lipid Profile: No results for input(s): CHOL, HDL, LDLCALC, TRIG, CHOLHDL, LDLDIRECT in the last 72 hours. Thyroid Function Tests: No results for input(s): TSH, T4TOTAL, FREET4, T3FREE, THYROIDAB in the last 72 hours. Anemia Panel: No results for input(s): VITAMINB12, FOLATE, FERRITIN, TIBC, IRON, RETICCTPCT in the last 72 hours. Urine analysis:    Component Value Date/Time   COLORURINE YELLOW 12/23/2015 1600   APPEARANCEUR CLEAR 12/23/2015 1600   LABSPEC 1.010 12/23/2015 1600   PHURINE 6.5 12/23/2015 1600   GLUCOSEU NEGATIVE 12/23/2015 1600   GLUCOSEU NEGATIVE 04/15/2015 1023   HGBUR NEGATIVE 12/23/2015 1600   BILIRUBINUR NEGATIVE 12/23/2015 1600   KETONESUR NEGATIVE 12/23/2015 1600   PROTEINUR NEGATIVE 12/23/2015 1600   UROBILINOGEN 0.2 04/15/2015 1023   NITRITE NEGATIVE 12/23/2015 1600   LEUKOCYTESUR NEGATIVE 12/23/2015 1600   Sepsis Labs: @LABRCNTIP (procalcitonin:4,lacticidven:4) )No results found for this or any previous visit (from the past 240 hour(s)).   Radiological Exams on Admission: Dg Chest 2 View  12/23/2015  CLINICAL DATA:  Shortness of breath.  History of CHF. EXAM: CHEST  2 VIEW COMPARISON:  01/06/2013 FINDINGS: Again noted is enlargement of the cardiac silhouette. There is no evidence for pulmonary edema or focal airspace disease. No large pleural effusions. Degenerative changes in the thoracic spine. No acute bone abnormality. IMPRESSION: No acute chest findings.  No evidence for pulmonary edema. Electronically Signed   By: Markus Daft M.D.   On: 12/23/2015 16:15    EKG: Independently reviewed. Atrial fibrillation, RAD, low-voltage QRS, septal Q-wave, QTc 503 ms    Assessment/Plan  1. Acute on chronic hypoxic respiratory failure, suspected secondary to acute CHF  - Dependent on supplemental O2 at baseline with increasing requirements - Appears to have gained 10 kg since July 2016; pt reports 20 lb wt gain in last month  - TTE (12/27/12) with EF 55%, mild LVH, mod-severe LAE, indeterminate diastolic function  -  Directed to the ED today after seeing his cardiologist for these complaints  - Per cardiology recommendations, diuresing with Lasix 60 mg IV q8h  - Follow strict I/Os, daily wts, SLIV, fluid-restrict diet  - Update TTE, ordered    2. OSA, OHS, chronic bronchitis  - CPAP qHS  - DuoNeb prn   3. Type II DM - Managed with glipizide 10 mg qD at home  - A1c was 8.1% in March 2017, reflecting poor glycemic-control at that time  - Hold glipizide while in hospital  - Check CBG with meals and qHS - Low-intensity SSI prn   4. Hypertension - At goal currently  - Managed with Lopressor, Aldactone, Lasix at home  - Was previously on ACEi but could not tolerate d/t hyperkalemia and renal insufficiency  - Continue Lopressor and Aldactone at current dose  - Lasix 60 mg IV q8h    5. Hyperlipidemia - LDL 39, HDL 39 in March 2017  - Treated with Lipitor at home  - Continue Lipitor   6. Gout  - Stable, no current flare  - Continue allopurinol   7. Chronic atrial fibrillation  - CHADS-VASc is 4 (age, HTN, DM, CHF)  - Continue AC with Eliquis  - Rate is well-controlled with diltiazem, continue    DVT prophylaxis: Eliquis  Code Status: Full  Family Communication: Wife at bedside  Disposition Plan: Admit to med/surg  Consults called: Cardiology will consult  Admission status: Inpatient    Vianne Bulls, MD [facility] Pager [phone]  If 7PM-7AM, please contact night-coverage [email or web address] Password [email or web address]  12/23/2015, 7:25 PM

## 2015-12-23 NOTE — Patient Instructions (Signed)
Go to ER at [facility]  

## 2015-12-23 NOTE — ED Notes (Signed)
Per wife, pt went to cardiac rehab because he was more SOB. Pt was then brought over to the ED for treatment. Pt denies chest pain . Wife states pt is on 02, 4 liters and used CPAP ar night

## 2015-12-24 ENCOUNTER — Inpatient Hospital Stay (HOSPITAL_COMMUNITY): Payer: Medicare Other

## 2015-12-24 DIAGNOSIS — R0600 Dyspnea, unspecified: Secondary | ICD-10-CM

## 2015-12-24 LAB — BASIC METABOLIC PANEL
ANION GAP: 8 (ref 5–15)
BUN: 18 mg/dL (ref 6–20)
CALCIUM: 8.7 mg/dL — AB (ref 8.9–10.3)
CO2: 39 mmol/L — ABNORMAL HIGH (ref 22–32)
Chloride: 95 mmol/L — ABNORMAL LOW (ref 101–111)
Creatinine, Ser: 1.02 mg/dL (ref 0.61–1.24)
GLUCOSE: 154 mg/dL — AB (ref 65–99)
Potassium: 3.4 mmol/L — ABNORMAL LOW (ref 3.5–5.1)
Sodium: 142 mmol/L (ref 135–145)

## 2015-12-24 LAB — ECHOCARDIOGRAM COMPLETE
HEIGHTINCHES: 68 in
Weight: 3992 oz

## 2015-12-24 LAB — GLUCOSE, CAPILLARY
GLUCOSE-CAPILLARY: 151 mg/dL — AB (ref 65–99)
Glucose-Capillary: 164 mg/dL — ABNORMAL HIGH (ref 65–99)
Glucose-Capillary: 128 mg/dL — ABNORMAL HIGH (ref 65–99)

## 2015-12-24 LAB — HEMOGLOBIN A1C
Hgb A1c MFr Bld: 8.2 % — ABNORMAL HIGH (ref 4.8–5.6)
Mean Plasma Glucose: 189 mg/dL

## 2015-12-24 MED ORDER — METOPROLOL TARTRATE 25 MG PO TABS
25.0000 mg | ORAL_TABLET | Freq: Two times a day (BID) | ORAL | Status: DC
  Administered 2015-12-24 – 2015-12-27 (×6): 25 mg via ORAL
  Filled 2015-12-24 (×6): qty 1

## 2015-12-24 NOTE — Progress Notes (Signed)
PROGRESS NOTE    [name] [name]  [id number] DOB: [birth year] DOA: 12/23/2015 PCP: Cathlean Cower, MD     Brief Narrative:  70 year old man admitted on 5/16 with complaints of shortness of breath, swelling and weight gain. He does have a history of chronic diastolic CHF objective sleep apnea, obesity hypoventilation syndrome. Was noted to be in acute CHF exacerbation was admitted to the hospital.   Assessment & Plan:   Principal Problem:   Volume overload Active Problems:   Diabetes (HCC)   Hyperlipidemia   Essential hypertension   Atrial fibrillation (HCC)   Chronic cor pulmonale (HCC)   Acute CHF (congestive heart failure) (HCC)   OSA on CPAP   Pulmonary hypertension (HCC)   Gout   Acute on chronic hypoxemic respiratory failure -Multifactorial but mainly due to acute on chronic diastolic CHF, also due to obstructive sleep apnea/obesity hypoventilation syndrome, morbid obesity. -Continue oxygen as required to keep sats above 90%.  Acute on chronic diastolic CHF -Echo shows an ejection fraction of 55-60% but not technically sufficient to allow evaluation of LV diastolic dysfunction due to atrial fibrillation. -Continue Lasix and strive for negative fluid balance, he is 1.4 L negative since admission.  Type 2 diabetes -Fair control.  Hypertension -Well-controlled.  Obstructive sleep apnea -Continue C Pap at bedtime.   Chronic atrial fibrillation -Continue anticoagulation with Eliquis, is currently rate controlled on diltiazem.   DVT prophylaxis: Eliquis Code Status: Full code Family Communication: Wife at bedside updated on plan of care and all questions answered Disposition Plan: Anticipate another 48-72 hours in the hospital  Consultants:   None  Procedures:   None  Antimicrobials:   None    Subjective: Still feels short of breath, feels less swollen  Objective: Filed Vitals:   12/23/15 2141 12/23/15 2214 12/24/15 0500 12/24/15 1344  BP: 127/85   119/59 124/82  Pulse: 75 71 90 78  Temp: 98.2 F (36.8 C)  97.9 F (36.6 C) 98.3 F (36.8 C)  TempSrc: Oral  Oral Oral  Resp: 20  20 18   Height:      Weight:   113.172 kg (249 lb 8 oz)   SpO2: 98% 92% 94% 97%    Intake/Output Summary (Last 24 hours) at 12/24/15 1738 Last data filed at 12/24/15 1300  Gross per 24 hour  Intake    240 ml  Output   1650 ml  Net  -1410 ml   Filed Weights   12/23/15 1510 12/23/15 1838 12/24/15 0500  Weight: 134.537 kg (296 lb 9.6 oz) 113.127 kg (249 lb 6.4 oz) 113.172 kg (249 lb 8 oz)    Examination:  General exam: Alert, awake, oriented x 3 Respiratory system: Bibasilar crackles Cardiovascular system:RRR. No murmurs, rubs, gallops. Gastrointestinal system: Abdomen is nondistended, soft and nontender. No organomegaly or masses felt. Normal bowel sounds heard. Central nervous system: Alert and oriented. No focal neurological deficits. Extremities: No C/C/E, +pedal pulses Skin: No rashes, lesions or ulcers Psychiatry: Judgement and insight appear normal. Mood & affect appropriate.     Data Reviewed: I have personally reviewed following labs and imaging studies  CBC:  Recent Labs Lab 12/23/15 1531  WBC 12.6*  NEUTROABS 8.7*  HGB 15.2  HCT 51.0  MCV 95.3  PLT 0000000   Basic Metabolic Panel:  Recent Labs Lab 12/23/15 1531 12/24/15 0500  NA 140 142  K 3.7 3.4*  CL 93* 95*  CO2 39* 39*  GLUCOSE 126* 154*  BUN 18 18  CREATININE 1.03 1.02  CALCIUM 9.0 8.7*  MG 1.8  --    GFR: Estimated Creatinine Clearance: 82.3 mL/min (by C-G formula based on Cr of 1.02). Liver Function Tests:  Recent Labs Lab 12/23/15 1531  AST 13*  ALT 14*  ALKPHOS 74  BILITOT 0.8  PROT 6.9  ALBUMIN 3.2*   No results for input(s): LIPASE, AMYLASE in the last 168 hours. No results for input(s): AMMONIA in the last 168 hours. Coagulation Profile: No results for input(s): INR, PROTIME in the last 168 hours. Cardiac Enzymes:  Recent Labs Lab  12/23/15 1531  TROPONINI <0.03   BNP (last 3 results) No results for input(s): PROBNP in the last 8760 hours. HbA1C:  Recent Labs  12/23/15 1531  HGBA1C 8.2*   CBG:  Recent Labs Lab 12/23/15 2053 12/24/15 0718 12/24/15 1123 12/24/15 1617  GLUCAP 184* 164* 151* 128*   Lipid Profile: No results for input(s): CHOL, HDL, LDLCALC, TRIG, CHOLHDL, LDLDIRECT in the last 72 hours. Thyroid Function Tests: No results for input(s): TSH, T4TOTAL, FREET4, T3FREE, THYROIDAB in the last 72 hours. Anemia Panel: No results for input(s): VITAMINB12, FOLATE, FERRITIN, TIBC, IRON, RETICCTPCT in the last 72 hours. Urine analysis:    Component Value Date/Time   COLORURINE YELLOW 12/23/2015 1600   APPEARANCEUR CLEAR 12/23/2015 1600   LABSPEC 1.010 12/23/2015 1600   PHURINE 6.5 12/23/2015 1600   GLUCOSEU NEGATIVE 12/23/2015 1600   GLUCOSEU NEGATIVE 04/15/2015 1023   HGBUR NEGATIVE 12/23/2015 1600   BILIRUBINUR NEGATIVE 12/23/2015 1600   KETONESUR NEGATIVE 12/23/2015 1600   PROTEINUR NEGATIVE 12/23/2015 1600   UROBILINOGEN 0.2 04/15/2015 1023   NITRITE NEGATIVE 12/23/2015 1600   LEUKOCYTESUR NEGATIVE 12/23/2015 1600   Sepsis Labs: @LABRCNTIP (procalcitonin:4,lacticidven:4)  )No results found for this or any previous visit (from the past 240 hour(s)).       Radiology Studies: Dg Chest 2 View  12/23/2015  CLINICAL DATA:  Shortness of breath.  History of CHF. EXAM: CHEST  2 VIEW COMPARISON:  01/06/2013 FINDINGS: Again noted is enlargement of the cardiac silhouette. There is no evidence for pulmonary edema or focal airspace disease. No large pleural effusions. Degenerative changes in the thoracic spine. No acute bone abnormality. IMPRESSION: No acute chest findings.  No evidence for pulmonary edema. Electronically Signed   By: Markus Daft M.D.   On: 12/23/2015 16:15        Scheduled Meds: . allopurinol  100 mg Oral Daily  . apixaban  5 mg Oral BID  . atorvastatin  20 mg Oral q1800    . diltiazem  180 mg Oral Daily  . furosemide  60 mg Intravenous Q8H  . insulin aspart  0-9 Units Subcutaneous TID WC  . loratadine  10 mg Oral Daily  . metoprolol  25 mg Oral BID  . potassium chloride  20 mEq Oral BID  . sodium chloride flush  3 mL Intravenous Q12H  . spironolactone  25 mg Oral Daily   Continuous Infusions:    LOS: 1 day    Time spent: 30 minutes. Greater than 50% of this time was spent in direct contact with the patient coordinating care.     Lelon Frohlich, MD [facility] Pager [phone]  If 7PM-7AM, please contact night-coverage [email or web address] Password [facility] 12/24/2015, 5:38 PM

## 2015-12-24 NOTE — Progress Notes (Signed)
*  PRELIMINARY RESULTS* Echocardiogram 2D Echocardiogram has been performed.  Leavy Cella 12/24/2015, 2:37 PM

## 2015-12-25 DIAGNOSIS — I2781 Cor pulmonale (chronic): Secondary | ICD-10-CM

## 2015-12-25 DIAGNOSIS — I4891 Unspecified atrial fibrillation: Secondary | ICD-10-CM

## 2015-12-25 DIAGNOSIS — G4733 Obstructive sleep apnea (adult) (pediatric): Secondary | ICD-10-CM

## 2015-12-25 DIAGNOSIS — I5023 Acute on chronic systolic (congestive) heart failure: Secondary | ICD-10-CM

## 2015-12-25 DIAGNOSIS — E785 Hyperlipidemia, unspecified: Secondary | ICD-10-CM

## 2015-12-25 DIAGNOSIS — I272 Other secondary pulmonary hypertension: Secondary | ICD-10-CM

## 2015-12-25 DIAGNOSIS — I10 Essential (primary) hypertension: Secondary | ICD-10-CM

## 2015-12-25 LAB — BASIC METABOLIC PANEL
Anion gap: 7 (ref 5–15)
BUN: 22 mg/dL — ABNORMAL HIGH (ref 6–20)
CHLORIDE: 95 mmol/L — AB (ref 101–111)
CO2: 39 mmol/L — AB (ref 22–32)
CREATININE: 0.94 mg/dL (ref 0.61–1.24)
Calcium: 8.8 mg/dL — ABNORMAL LOW (ref 8.9–10.3)
GFR calc non Af Amer: >60 mL/min (ref >60)
Glucose, Bld: 153 mg/dL — ABNORMAL HIGH (ref 65–99)
Potassium: 3.7 mmol/L (ref 3.5–5.1)
Sodium: 141 mmol/L (ref 135–145)

## 2015-12-25 LAB — GLUCOSE, CAPILLARY
GLUCOSE-CAPILLARY: 151 mg/dL — AB (ref 65–99)
GLUCOSE-CAPILLARY: 155 mg/dL — AB (ref 65–99)
Glucose-Capillary: 126 mg/dL — ABNORMAL HIGH (ref 65–99)
Glucose-Capillary: 178 mg/dL — ABNORMAL HIGH (ref 65–99)

## 2015-12-25 NOTE — Consult Note (Signed)
   [facility] CM Inpatient Consult   12/25/2015  [name] [name] 12-04-1945 [id number]  Spoke with patient and daughter [name] [name] at bedside regarding [facility] services. Patient does not wish to participate with [facility] at this time, stating that he has a wife, daughter and granddaughter who "takes care of me". Patient given [facility] brochure and contact information for future reference.  Of note, [facility] Care Management services would not replace or interfere with any services that are arranged by inpatient case management or social work. For additional questions or referrals please contact:  Royetta Crochet. Laymond Purser, RN, BSN, [facility] Liaison [phone]

## 2015-12-25 NOTE — Progress Notes (Signed)
Informed by central telemetry that pt had a 2.12 second pause. Pt in stable condition and in no acute distress. MD E-Paged. Pt in NSR.

## 2015-12-25 NOTE — Care Management Note (Signed)
Case Management Note  Patient Details  Name: [name] [name] MRN: [id number] Date of Birth: 1946/03/17  Subjective/Objective:                  Pt is from home, lives with his wife and is ind with ADL's. Pt has home O2, CPAP, and neb machine through [facility]. Pt uses 3-5 LPM at home. Pt states he has had HH in the past and would most likely not be interested in it again. Pt plans to return home with self care. Pt has PCP, transportation and no difficulty affording his medications.   Action/Plan: No CM needs anticipated, will cont to follow.   Expected Discharge Date:      12/26/2015            Expected Discharge Plan:  Home/Self Care  In-House Referral:  NA  Discharge planning Services  CM Consult  Post Acute Care Choice:  NA Choice offered to:  NA  DME Arranged:    DME Agency:     HH Arranged:    HH Agency:     Status of Service:  In process, will continue to follow  Medicare Important Message Given:    Date Medicare IM Given:    Medicare IM give by:    Date Additional Medicare IM Given:    Additional Medicare Important Message give by:     If discussed at [place] of Stay Meetings, dates discussed:    Additional Comments:  Sherald Barge, RN 12/25/2015, 8:07 AM

## 2015-12-25 NOTE — Progress Notes (Signed)
Primary Cardiologist:Crenshaw MD  Cardiology Specific Problem List: 1. Permanent Atrial Fib 2. Right Heart Failure 3. Hypertension    Subjective:   Feeling and breathing much better. Still with complaints of abdominal distention but able to take deep breaths.    Objective:   Temp:  [97.8 F (36.6 C)-98.3 F (36.8 C)] 97.8 F (36.6 C) (05/18 0518) Pulse Rate:  [67-92] 92 (05/18 0842) Resp:  [18-20] 20 (05/18 0842) BP: (110-138)/(54-97) 138/97 mmHg (05/18 0842) SpO2:  [94 %-97 %] 95 % (05/18 0842) Weight:  [292 lb 11.2 oz (132.768 kg)] 292 lb 11.2 oz (132.768 kg) (05/18 0526) Last BM Date: 12/19/15  Filed Weights   12/23/15 1838 12/24/15 0500 12/25/15 0526  Weight: 249 lb 6.4 oz (113.127 kg) 249 lb 8 oz (113.172 kg) 292 lb 11.2 oz (132.768 kg)    Intake/Output Summary (Last 24 hours) at 12/25/15 0858 Last data filed at 12/25/15 0845  Gross per 24 hour  Intake    483 ml  Output   2675 ml  Net  -2192 ml    Telemetry: Atrial fib rates in the 90's One episode of bradycardia.   Exam:  General: No acute distress.  HEENT: Conjunctiva and lids normal, oropharynx clear.  Lungs: Clear to auscultation, nonlabored.  Cardiac: No elevated JVP or bruits. Neck obese. IRRR, no gallop or rub.   Abdomen: Normoactive bowel sounds, nontender, distended.   Extremities: Woody pitting edema, distal pulses diminished.   Neuropsychiatric: Alert and oriented x3, affect appropriate.   Lab Results:  Basic Metabolic Panel:  Recent Labs Lab 12/23/15 1531 12/24/15 0500 12/25/15 0520  NA 140 142 141  K 3.7 3.4* 3.7  CL 93* 95* 95*  CO2 39* 39* 39*  GLUCOSE 126* 154* 153*  BUN 18 18 22*  CREATININE 1.03 1.02 0.94  CALCIUM 9.0 8.7* 8.8*  MG 1.8  --   --     Liver Function Tests:  Recent Labs Lab 12/23/15 1531  AST 13*  ALT 14*  ALKPHOS 74  BILITOT 0.8  PROT 6.9  ALBUMIN 3.2*    CBC:  Recent Labs Lab 12/23/15 1531  WBC 12.6*  HGB 15.2  HCT 51.0  MCV  95.3  PLT 261    Cardiac Enzymes:  Recent Labs Lab 12/23/15 1531  TROPONINI <0.03    Radiology: Dg Chest 2 View  12/23/2015  CLINICAL DATA:  Shortness of breath.  History of CHF. EXAM: CHEST  2 VIEW COMPARISON:  01/06/2013 FINDINGS: Again noted is enlargement of the cardiac silhouette. There is no evidence for pulmonary edema or focal airspace disease. No large pleural effusions. Degenerative changes in the thoracic spine. No acute bone abnormality. IMPRESSION: No acute chest findings.  No evidence for pulmonary edema. Electronically Signed   By: Markus Daft M.D.   On: 12/23/2015 16:15     Medications:   Scheduled Medications: . allopurinol  100 mg Oral Daily  . apixaban  5 mg Oral BID  . atorvastatin  20 mg Oral q1800  . diltiazem  180 mg Oral Daily  . furosemide  60 mg Intravenous Q8H  . insulin aspart  0-9 Units Subcutaneous TID WC  . loratadine  10 mg Oral Daily  . metoprolol  25 mg Oral BID  . potassium chloride  20 mEq Oral BID  . sodium chloride flush  3 mL Intravenous Q12H  . spironolactone  25 mg Oral Daily      PRN Medications: sodium chloride, acetaminophen, ipratropium-albuterol, ondansetron (ZOFRAN) IV,  sodium chloride flush   Assessment and Plan:   1. Acute on chronic RHF: Improving breathing status and edema, but slowly. He has diuresed 3.342 liters since admission. He remains on lasix 60 mg Q 8 hours and potassium. Dry weight around 281. He is now 292, down from 296 on admission. Creatinine improving with diuresis from 1.03 to 0.94.CC 39.   2. Atrial fibrillation: Heart rate is mildly elevated in the 90's but had period of bradycardia overnight. Consider OSA?  No changes in rate control medications at this time.   3. Hypertension: BP is controlled.   Phill Myron. Lawrence NP [place]  12/25/2015, 8:58 AM   The patient was seen and examined, and I agree with the physical exam, assessment and plan as documented above by K. Lawrence NP, with modifications as  noted below. He is feeling much better. Fluid accumulation had primarily been in the abdominal region. Says he normally weighs 270 lbs at home. Wt today is 292 lbs. I suspect he may need to be switched to torsemide upon discharge.  Would continue IV diuretics for the next 48 hours minimum.  Kate Sable, MD, [facility]  12/25/2015 9:54 AM

## 2015-12-25 NOTE — Progress Notes (Signed)
PROGRESS NOTE    [name] [name]  [id number] DOB: 22-Jul-1946 DOA: 12/23/2015 PCP: Cathlean Cower, MD     Brief Narrative:  70 year old man admitted on 5/16 with complaints of shortness of breath, swelling and weight gain. He does have a history of chronic diastolic CHF objective sleep apnea, obesity hypoventilation syndrome. Was noted to be in acute CHF exacerbation was admitted to the hospital.   Assessment & Plan:   Principal Problem:   Volume overload Active Problems:   Diabetes (HCC)   Hyperlipidemia   Essential hypertension   Atrial fibrillation (HCC)   Chronic cor pulmonale (HCC)   Acute CHF (congestive heart failure) (HCC)   OSA on CPAP   Pulmonary hypertension (HCC)   Gout   Acute on chronic hypoxemic respiratory failure -Multifactorial but mainly due to acute on chronic diastolic CHF, also due to obstructive sleep apnea/obesity hypoventilation syndrome, morbid obesity. -Continue oxygen as required to keep sats above 90%.  Acute on chronic diastolic CHF -Echo shows an ejection fraction of 55-60% but not technically sufficient to allow evaluation of LV diastolic dysfunction due to atrial fibrillation. -Continue Lasix and strive for negative fluid balance, he is 2.4 L negative since admission. -Anticipate IV diuresis for approximately another 48 hours due to current volume status.  Type 2 diabetes -Fair control.  Hypertension -Well-controlled.  Obstructive sleep apnea -Continue C Pap at bedtime.   Chronic atrial fibrillation -Continue anticoagulation with Eliquis, is currently rate controlled on diltiazem.   DVT prophylaxis: Eliquis Code Status: Full code Family Communication: Patient only Disposition Plan: Anticipate another 48-72 hours in the hospital  Consultants:   None  Procedures:   None  Antimicrobials:   None    Subjective: Still feels short of breath, feels less swollen  Objective: Filed Vitals:   12/24/15 2208 12/25/15 0518 12/25/15  0526 12/25/15 0842  BP: 110/54 122/72  138/97  Pulse: 67   92  Temp: 98.2 F (36.8 C) 97.8 F (36.6 C)    TempSrc: Oral Oral    Resp: 20 20  20   Height:      Weight:   132.768 kg (292 lb 11.2 oz)   SpO2: 94% 95%  95%    Intake/Output Summary (Last 24 hours) at 12/25/15 1318 Last data filed at 12/25/15 1253  Gross per 24 hour  Intake   1203 ml  Output   2175 ml  Net   -972 ml   Filed Weights   12/23/15 1838 12/24/15 0500 12/25/15 0526  Weight: 113.127 kg (249 lb 6.4 oz) 113.172 kg (249 lb 8 oz) 132.768 kg (292 lb 11.2 oz)    Examination:  General exam: Alert, awake, oriented x 3 Respiratory system: Bibasilar crackles Cardiovascular system:RRR. No murmurs, rubs, gallops. Gastrointestinal system: Abdomen is nondistended, soft and nontender. No organomegaly or masses felt. Normal bowel sounds heard. Central nervous system: Alert and oriented. No focal neurological deficits. Extremities: 2+ edema, +pedal pulses Skin: No rashes, lesions or ulcers Psychiatry: Judgement and insight appear normal. Mood & affect appropriate.     Data Reviewed: I have personally reviewed following labs and imaging studies  CBC:  Recent Labs Lab 12/23/15 1531  WBC 12.6*  NEUTROABS 8.7*  HGB 15.2  HCT 51.0  MCV 95.3  PLT 0000000   Basic Metabolic Panel:  Recent Labs Lab 12/23/15 1531 12/24/15 0500 12/25/15 0520  NA 140 142 141  K 3.7 3.4* 3.7  CL 93* 95* 95*  CO2 39* 39* 39*  GLUCOSE 126* 154* 153*  BUN  18 18 22*  CREATININE 1.03 1.02 0.94  CALCIUM 9.0 8.7* 8.8*  MG 1.8  --   --    GFR: Estimated Creatinine Clearance: 97.4 mL/min (by C-G formula based on Cr of 0.94). Liver Function Tests:  Recent Labs Lab 12/23/15 1531  AST 13*  ALT 14*  ALKPHOS 74  BILITOT 0.8  PROT 6.9  ALBUMIN 3.2*   No results for input(s): LIPASE, AMYLASE in the last 168 hours. No results for input(s): AMMONIA in the last 168 hours. Coagulation Profile: No results for input(s): INR, PROTIME in  the last 168 hours. Cardiac Enzymes:  Recent Labs Lab 12/23/15 1531  TROPONINI <0.03   BNP (last 3 results) No results for input(s): PROBNP in the last 8760 hours. HbA1C:  Recent Labs  12/23/15 1531  HGBA1C 8.2*   CBG:  Recent Labs Lab 12/24/15 0718 12/24/15 1123 12/24/15 1617 12/25/15 0744 12/25/15 1150  GLUCAP 164* 151* 128* 151* 155*   Lipid Profile: No results for input(s): CHOL, HDL, LDLCALC, TRIG, CHOLHDL, LDLDIRECT in the last 72 hours. Thyroid Function Tests: No results for input(s): TSH, T4TOTAL, FREET4, T3FREE, THYROIDAB in the last 72 hours. Anemia Panel: No results for input(s): VITAMINB12, FOLATE, FERRITIN, TIBC, IRON, RETICCTPCT in the last 72 hours. Urine analysis:    Component Value Date/Time   COLORURINE YELLOW 12/23/2015 1600   APPEARANCEUR CLEAR 12/23/2015 1600   LABSPEC 1.010 12/23/2015 1600   PHURINE 6.5 12/23/2015 1600   GLUCOSEU NEGATIVE 12/23/2015 1600   GLUCOSEU NEGATIVE 04/15/2015 1023   HGBUR NEGATIVE 12/23/2015 1600   BILIRUBINUR NEGATIVE 12/23/2015 1600   KETONESUR NEGATIVE 12/23/2015 1600   PROTEINUR NEGATIVE 12/23/2015 1600   UROBILINOGEN 0.2 04/15/2015 1023   NITRITE NEGATIVE 12/23/2015 1600   LEUKOCYTESUR NEGATIVE 12/23/2015 1600   Sepsis Labs: @LABRCNTIP (procalcitonin:4,lacticidven:4)  ) Recent Results (from the past 240 hour(s))  Urine culture     Status: None (Preliminary result)   Collection Time: 12/23/15  4:00 PM  Result Value Ref Range Status   Specimen Description URINE, CLEAN CATCH  Final   Special Requests NONE  Final   Culture   Final    CULTURE REINCUBATED FOR BETTER GROWTH Performed at [facility]    Report Status PENDING  Incomplete         Radiology Studies: Dg Chest 2 View  12/23/2015  CLINICAL DATA:  Shortness of breath.  History of CHF. EXAM: CHEST  2 VIEW COMPARISON:  01/06/2013 FINDINGS: Again noted is enlargement of the cardiac silhouette. There is no evidence for pulmonary edema or  focal airspace disease. No large pleural effusions. Degenerative changes in the thoracic spine. No acute bone abnormality. IMPRESSION: No acute chest findings.  No evidence for pulmonary edema. Electronically Signed   By: Markus Daft M.D.   On: 12/23/2015 16:15        Scheduled Meds: . allopurinol  100 mg Oral Daily  . apixaban  5 mg Oral BID  . atorvastatin  20 mg Oral q1800  . diltiazem  180 mg Oral Daily  . furosemide  60 mg Intravenous Q8H  . insulin aspart  0-9 Units Subcutaneous TID WC  . loratadine  10 mg Oral Daily  . metoprolol  25 mg Oral BID  . potassium chloride  20 mEq Oral BID  . sodium chloride flush  3 mL Intravenous Q12H  . spironolactone  25 mg Oral Daily   Continuous Infusions:    LOS: 2 days    Time spent: 30 minutes. Greater than 50%  of this time was spent in direct contact with the patient coordinating care.     [name] Frohlich, MD [facility] Pager [phone]  If 7PM-7AM, please contact night-coverage [email or web address] Password [email or web address] 12/25/2015, 1:18 PM

## 2015-12-26 DIAGNOSIS — I5033 Acute on chronic diastolic (congestive) heart failure: Secondary | ICD-10-CM

## 2015-12-26 LAB — BASIC METABOLIC PANEL
ANION GAP: 5 (ref 5–15)
BUN: 23 mg/dL — ABNORMAL HIGH (ref 6–20)
CALCIUM: 8.8 mg/dL — AB (ref 8.9–10.3)
CO2: 41 mmol/L — AB (ref 22–32)
Chloride: 95 mmol/L — ABNORMAL LOW (ref 101–111)
Creatinine, Ser: 1.11 mg/dL (ref 0.61–1.24)
GLUCOSE: 161 mg/dL — AB (ref 65–99)
POTASSIUM: 4.1 mmol/L (ref 3.5–5.1)
Sodium: 141 mmol/L (ref 135–145)

## 2015-12-26 LAB — GLUCOSE, CAPILLARY
GLUCOSE-CAPILLARY: 173 mg/dL — AB (ref 65–99)
GLUCOSE-CAPILLARY: 115 mg/dL — AB (ref 65–99)
GLUCOSE-CAPILLARY: 158 mg/dL — AB (ref 65–99)
Glucose-Capillary: 199 mg/dL — ABNORMAL HIGH (ref 65–99)

## 2015-12-26 LAB — URINE CULTURE

## 2015-12-26 NOTE — Progress Notes (Signed)
Primary Cardiologist: Phill Mutter MD  Cardiology Specific Problem List: 1. Permanent Atrial fib 2. Right Heart Failure 3. Hypertension  Subjective:    Continuing to improve. Breathing better each day.   Objective:   Temp:  [97.9 F (36.6 C)-98.1 F (36.7 C)] 97.9 F (36.6 C) (05/19 0500) Pulse Rate:  [88-92] 88 (05/19 0500) Resp:  [20] 20 (05/19 0500) BP: (109-138)/(68-97) 138/68 mmHg (05/19 0500) SpO2:  [94 %-96 %] 94 % (05/19 0500) Weight:  [292 lb 1.8 oz (132.5 kg)] 292 lb 1.8 oz (132.5 kg) (05/19 0500) Last BM Date: 12/25/15  Filed Weights   12/24/15 0500 12/25/15 0526 12/26/15 0500  Weight: 249 lb 8 oz (113.172 kg) 292 lb 11.2 oz (132.768 kg) 292 lb 1.8 oz (132.5 kg)    Intake/Output Summary (Last 24 hours) at 12/26/15 0828 Last data filed at 12/26/15 0258  Gross per 24 hour  Intake    603 ml  Output   2560 ml  Net  -1957 ml    Exam:  General: No acute distress.  HEENT: Conjunctiva and lids normal, oropharynx clear.  Lungs: Clear to auscultation, nonlabored.  Cardiac: No elevated JVP or bruits. RRR, no gallop or rub.   Abdomen: Normoactive bowel sounds, nontender, nondistended.  Extremities: 2+ pitting edema, woody thickening scaly skin changes.  distal pulses full.  Neuropsychiatric: Alert and oriented x3, affect appropriate.   Lab Results:  Basic Metabolic Panel:  Recent Labs Lab 12/23/15 1531 12/24/15 0500 12/25/15 0520 12/26/15 0555  NA 140 142 141 141  K 3.7 3.4* 3.7 4.1  CL 93* 95* 95* 95*  CO2 39* 39* 39* 41*  GLUCOSE 126* 154* 153* 161*  BUN 18 18 22* 23*  CREATININE 1.03 1.02 0.94 1.11  CALCIUM 9.0 8.7* 8.8* 8.8*  MG 1.8  --   --   --     Liver Function Tests:  Recent Labs Lab 12/23/15 1531  AST 13*  ALT 14*  ALKPHOS 74  BILITOT 0.8  PROT 6.9  ALBUMIN 3.2*    CBC:  Recent Labs Lab 12/23/15 1531  WBC 12.6*  HGB 15.2  HCT 51.0  MCV 95.3  PLT 261    Cardiac Enzymes:  Recent Labs Lab  12/23/15 1531  TROPONINI <0.03     Medications:   Scheduled Medications: . allopurinol  100 mg Oral Daily  . apixaban  5 mg Oral BID  . atorvastatin  20 mg Oral q1800  . diltiazem  180 mg Oral Daily  . furosemide  60 mg Intravenous Q8H  . insulin aspart  0-9 Units Subcutaneous TID WC  . loratadine  10 mg Oral Daily  . metoprolol  25 mg Oral BID  . potassium chloride  20 mEq Oral BID  . sodium chloride flush  3 mL Intravenous Q12H  . spironolactone  25 mg Oral Daily     PRN Medications: sodium chloride, acetaminophen, ipratropium-albuterol, ondansetron (ZOFRAN) IV, sodium chloride flush   Assessment and Plan:   1.Acute on Chronic Diastolic Heart Failure: Continues to diurese, but weight is not reflective of this. He will be weighed on stand-up scale from now on and not in the bed.  Discussed with nursing. They will re-weigh and document. Continue to diurese with IV lasix.Creatinine 1.1. CO2 41.   2. Atrial fib: Heart rate is controlled with diltiazem and metoprolol.  Continue Eliquis.    3. Hypertension: Controlled.   Phill Myron. Lawrence NP [place]  12/26/2015, 8:28 AM   The patient was  seen and examined, and I agree with the physical exam, assessment and plan as documented above by K. Lawrence NP, with modifications as noted below. Continues to diurese with nearly 3 liters output in last 24+ hours. Agree weights not reflective of this as noted above and needs stand-up scale weighing. Would switch to torsemide at time of discharge, possibly 40 mg BID, but will depend on renal function. Needs close outpatient follow up.  Kate Sable, MD, [facility]  12/26/2015 9:14 AM

## 2015-12-26 NOTE — Progress Notes (Signed)
PROGRESS NOTE    [name] [name]  [id number] DOB: 04/21/1946 DOA: 12/23/2015 PCP: Cathlean Cower, MD     Brief Narrative:  70 year old man admitted on 5/16 with complaints of shortness of breath, swelling and weight gain. He does have a history of chronic diastolic CHF objective sleep apnea, obesity hypoventilation syndrome. Was noted to be in acute CHF exacerbation was admitted to the hospital.   Assessment & Plan:   Principal Problem:   Volume overload Active Problems:   Diabetes (HCC)   Hyperlipidemia   Essential hypertension   Atrial fibrillation (HCC)   Chronic cor pulmonale (HCC)   Acute CHF (congestive heart failure) (HCC)   OSA on CPAP   Pulmonary hypertension (HCC)   Gout   Acute on chronic congestive heart failure (HCC)   Acute on chronic hypoxemic respiratory failure -Multifactorial but mainly due to acute on chronic diastolic CHF, also due to obstructive sleep apnea/obesity hypoventilation syndrome, morbid obesity. -Continue oxygen as required to keep sats above 90%.  Acute on chronic diastolic CHF -Echo shows an ejection fraction of 55-60% but not technically sufficient to allow evaluation of LV diastolic dysfunction due to atrial fibrillation. -Continue Lasix and strive for negative fluid balance, he is 5.8 L negative since admission. -Anticipate IV diuresis for approximately another 24--48 hours due to current volume status. -Cardiology recommends torsemide 40 mg twice a day at time of discharge.  Type 2 diabetes -Fair control.  Hypertension -Well-controlled.  Obstructive sleep apnea -Continue C Pap at bedtime.   Chronic atrial fibrillation -Continue anticoagulation with Eliquis, is currently rate controlled on diltiazem.   DVT prophylaxis: Eliquis Code Status: Full code Family Communication: Patient only Disposition Plan: Anticipate another 48-72 hours in the hospital  Consultants:   None  Procedures:   None  Antimicrobials:   None     Subjective: Still feels short of breath, feels less swollen  Objective: Filed Vitals:   12/26/15 0500 12/26/15 0854 12/26/15 0855 12/26/15 1126  BP: 138/68 127/82 127/82   Pulse: 88  100   Temp: 97.9 F (36.6 C)     TempSrc: Oral     Resp: 20     Height:      Weight: 132.5 kg (292 lb 1.8 oz)   131.634 kg (290 lb 3.2 oz)  SpO2: 94%       Intake/Output Summary (Last 24 hours) at 12/26/15 1704 Last data filed at 12/26/15 1238  Gross per 24 hour  Intake      3 ml  Output   3285 ml  Net  -3282 ml   Filed Weights   12/25/15 0526 12/26/15 0500 12/26/15 1126  Weight: 132.768 kg (292 lb 11.2 oz) 132.5 kg (292 lb 1.8 oz) 131.634 kg (290 lb 3.2 oz)    Examination:  General exam: Alert, awake, oriented x 3 Respiratory system: Bibasilar crackles Cardiovascular system:RRR. No murmurs, rubs, gallops. Gastrointestinal system: Abdomen is nondistended, soft and nontender. No organomegaly or masses felt. Normal bowel sounds heard. Central nervous system: Alert and oriented. No focal neurological deficits. Extremities: 2+ edema, +pedal pulses Skin: No rashes, lesions or ulcers Psychiatry: Judgement and insight appear normal. Mood & affect appropriate.     Data Reviewed: I have personally reviewed following labs and imaging studies  CBC:  Recent Labs Lab 12/23/15 1531  WBC 12.6*  NEUTROABS 8.7*  HGB 15.2  HCT 51.0  MCV 95.3  PLT 0000000   Basic Metabolic Panel:  Recent Labs Lab 12/23/15 1531 12/24/15 0500 12/25/15 0520  12/26/15 0555  NA 140 142 141 141  K 3.7 3.4* 3.7 4.1  CL 93* 95* 95* 95*  CO2 39* 39* 39* 41*  GLUCOSE 126* 154* 153* 161*  BUN 18 18 22* 23*  CREATININE 1.03 1.02 0.94 1.11  CALCIUM 9.0 8.7* 8.8* 8.8*  MG 1.8  --   --   --    GFR: Estimated Creatinine Clearance: 82.1 mL/min (by C-G formula based on Cr of 1.11). Liver Function Tests:  Recent Labs Lab 12/23/15 1531  AST 13*  ALT 14*  ALKPHOS 74  BILITOT 0.8  PROT 6.9  ALBUMIN 3.2*    No results for input(s): LIPASE, AMYLASE in the last 168 hours. No results for input(s): AMMONIA in the last 168 hours. Coagulation Profile: No results for input(s): INR, PROTIME in the last 168 hours. Cardiac Enzymes:  Recent Labs Lab 12/23/15 1531  TROPONINI <0.03   BNP (last 3 results) No results for input(s): PROBNP in the last 8760 hours. HbA1C: No results for input(s): HGBA1C in the last 72 hours. CBG:  Recent Labs Lab 12/25/15 1150 12/25/15 1622 12/25/15 2016 12/26/15 0829 12/26/15 1217  GLUCAP 155* 126* 178* 199* 173*   Lipid Profile: No results for input(s): CHOL, HDL, LDLCALC, TRIG, CHOLHDL, LDLDIRECT in the last 72 hours. Thyroid Function Tests: No results for input(s): TSH, T4TOTAL, FREET4, T3FREE, THYROIDAB in the last 72 hours. Anemia Panel: No results for input(s): VITAMINB12, FOLATE, FERRITIN, TIBC, IRON, RETICCTPCT in the last 72 hours. Urine analysis:    Component Value Date/Time   COLORURINE YELLOW 12/23/2015 1600   APPEARANCEUR CLEAR 12/23/2015 1600   LABSPEC 1.010 12/23/2015 1600   PHURINE 6.5 12/23/2015 1600   GLUCOSEU NEGATIVE 12/23/2015 1600   GLUCOSEU NEGATIVE 04/15/2015 1023   HGBUR NEGATIVE 12/23/2015 1600   BILIRUBINUR NEGATIVE 12/23/2015 1600   KETONESUR NEGATIVE 12/23/2015 1600   PROTEINUR NEGATIVE 12/23/2015 1600   UROBILINOGEN 0.2 04/15/2015 1023   NITRITE NEGATIVE 12/23/2015 1600   LEUKOCYTESUR NEGATIVE 12/23/2015 1600   Sepsis Labs: @LABRCNTIP (procalcitonin:4,lacticidven:4)  ) Recent Results (from the past 240 hour(s))  Urine culture     Status: Abnormal   Collection Time: 12/23/15  4:00 PM  Result Value Ref Range Status   Specimen Description URINE, CLEAN CATCH  Final   Special Requests NONE  Final   Culture MULTIPLE SPECIES PRESENT, SUGGEST RECOLLECTION (A)  Final   Report Status 12/26/2015 FINAL  Final         Radiology Studies: No results found.      Scheduled Meds: . allopurinol  100 mg Oral Daily   . apixaban  5 mg Oral BID  . atorvastatin  20 mg Oral q1800  . diltiazem  180 mg Oral Daily  . furosemide  60 mg Intravenous Q8H  . insulin aspart  0-9 Units Subcutaneous TID WC  . loratadine  10 mg Oral Daily  . metoprolol  25 mg Oral BID  . potassium chloride  20 mEq Oral BID  . sodium chloride flush  3 mL Intravenous Q12H  . spironolactone  25 mg Oral Daily   Continuous Infusions:    LOS: 3 days    Time spent: 30 minutes. Greater than 50% of this time was spent in direct contact with the patient coordinating care.     Lelon Frohlich, MD [facility] Pager [phone]  If 7PM-7AM, please contact night-coverage [email or web address] Password [email or web address] 12/26/2015, 5:04 PM

## 2015-12-26 NOTE — Care Management Important Message (Signed)
Important Message  Patient Details  Name: [name] [name] MRN: [id number] Date of Birth: 1946-03-21   Medicare Important Message Given:  Yes    Maryclaire Stoecker, Chauncey Reading, RN 12/26/2015, 9:26 AM

## 2015-12-27 DIAGNOSIS — I482 Chronic atrial fibrillation: Secondary | ICD-10-CM

## 2015-12-27 DIAGNOSIS — I5031 Acute diastolic (congestive) heart failure: Secondary | ICD-10-CM

## 2015-12-27 LAB — GLUCOSE, CAPILLARY
GLUCOSE-CAPILLARY: 140 mg/dL — AB (ref 65–99)
Glucose-Capillary: 149 mg/dL — ABNORMAL HIGH (ref 65–99)

## 2015-12-27 MED ORDER — METOPROLOL TARTRATE 25 MG PO TABS: 25.0000 mg | ORAL_TABLET | Freq: Two times a day (BID) | ORAL | Status: DC

## 2015-12-27 MED ORDER — TORSEMIDE 20 MG PO TABS: 40.0000 mg | ORAL_TABLET | Freq: Two times a day (BID) | ORAL | Status: DC

## 2015-12-27 NOTE — Discharge Summary (Signed)
Physician Discharge Summary  DASCHEL LAROCHELLE [id number] DOB: 1946/03/23 DOA: 12/23/2015  PCP: Cathlean Cower, MD  Admit date: 12/23/2015 Discharge date: 12/27/2015  Time spent: 35 minutes  Recommendations for Outpatient Follow-up:  1. Follow up with PCP in one week. 2. Follow up [facility]ology in one week.    Discharge Diagnoses:  Principal Problem:   Volume overload Active Problems:   Diabetes ([place])   Hyperlipidemia   Essential hypertension   Atrial fibrillation (HCC)   Chronic cor pulmonale (HCC)   Acute CHF (congestive heart failure) (HCC)   OSA on CPAP   Pulmonary hypertension (HCC)   Gout   Acute on chronic congestive heart failure ([place])   Discharge Condition: much improved.  Ambulate, no DOE, and no pedal edema.   Diet recommendation: heart heatlhy.   Filed Weights   12/26/15 0500 12/26/15 1126 12/27/15 0528  Weight: 132.5 kg (292 lb 1.8 oz) 131.634 kg (290 lb 3.2 oz) 130.953 kg (288 lb 11.2 oz)    History of present illness: patient was admitted for CHF by Dr Myna Hidalgo on Dec 23, 2015.  As per his H and P:  " HPI: Tahmir DAVONTAE [name] is a 70 y.o. male with medical history significant for permanent atrial fibrillation on Eliquis, hypertension, type 2 diabetes mellitus, obesity with OHS, pulmonary hypertension, OSA on CPAP, and chronic supplemental O2 requirement who presents to the ED at the direction of his cardiologist for evaluation of worsening dyspnea with lower extremity edema and weight gain. Patient reports a recent weight gain of 20 pounds with progressive edema in the bilateral lower extremities and increasing supplemental O2 requirement. Patient had been in his usual state of health until the insidious development of the aforementioned symptoms over the past month. He reports chronic bilateral lower extremity swelling distally, but notes that the edema has increased and is now into his thighs and abdomen. He denies orthopnea per se, but regularly sleeps in a recliner. He uses 3-5 L/m  of supplemental oxygen around the clock but his O2 saturations have remained in the mid to upper 80s despite this. He was evaluated and his cardiologist's clinic this afternoon, suspected to be in acute volume overload, and directed to the emergency department for further evaluation and management.  ED Course: Upon arrival to the ED, patient is found to be afebrile, saturating mid 90s on 4 L/m O2, and with vital signs otherwise stable. EKG features atrial fibrillation with right axis deviation, low-voltage QRS, QTC of 503 ms, and septal Q-wave. Chest x-ray is negative for acute cardiopulmonary disease. CBC features a leukocytosis to 12,600. Troponin is undetectable and BNP is elevated to 197. Urine is obtained and negative for signs of infection. CMP features a serum bicarbonate of 39 and mild hypochloremia of 93. Patient was given Lasix 60 mg IV and began to diurese. He remained hemodynamically stable and will be admitted to the hospital for ongoing evaluation and management of increasing supplemental O2 requirement with dyspnea and swelling, suspected secondary to acute volume overload.   Hospital Course: patient was admitted into the hospital, and he was diuresed.  He was seen in consultation and followed by the cardiology service.  He has an ECHO, which showed EF 55-60 percent, and his acute on chronic hypoxic respiratory failure was felt to be secondary to acute on chronic diastolic CHF, chronic obstructive sleep apnea and obesity hypoventilation syndrome.  He eventually did better with diuresis, and cardiology recommneded to discharge him on Demadex 40mg  oral BID, and his Cr  has bee stable at 1.0.  For his DM, it was well controlled.  For his HTN, his meds were continued and it was controlled.   His afib was well controlled, and he has been continued on his Eliquis, along with his cardiazem.  He is anxious to go home, and is stable for discharge.  He will follow up with his PCP next week, and will need to  see his his cardiologist in a week or so as well.  Thank you and Good Day.   Discharge Exam: Filed Vitals:   12/27/15 0528 12/27/15 0914  BP: 125/87 112/61  Pulse: 80 75  Temp: 97.8 F (36.6 C)   Resp: 20     Discharge Instructions  Current Discharge Medication List    START taking these medications   Details  torsemide (DEMADEX) 20 MG tablet Take 2 tablets (40 mg total) by mouth 2 (two) times daily. Qty: 120 tablet, Refills: 1      CONTINUE these medications which have CHANGED   Details  metoprolol tartrate (LOPRESSOR) 25 MG tablet Take 1 tablet (25 mg total) by mouth 2 (two) times daily. Qty: 60 tablet, Refills: 1      CONTINUE these medications which have NOT CHANGED   Details  allopurinol (ZYLOPRIM) 100 MG tablet TAKE 1 TABLET DAILY Qty: 90 tablet, Refills: 1    atorvastatin (LIPITOR) 20 MG tablet TAKE 1 TABLET DAILY Qty: 90 tablet, Refills: 2    cetirizine (ZYRTEC) 10 MG tablet Take 10 mg by mouth at bedtime.    diltiazem (CARDIZEM CD) 180 MG 24 hr capsule Take 1 capsule (180 mg total) by mouth daily. Qty: 90 capsule, Refills: 2    ELIQUIS 5 MG TABS tablet TAKE 1 TABLET TWICE A DAY Qty: 180 tablet, Refills: 1    glipiZIDE (GLIPIZIDE XL) 10 MG 24 hr tablet Take 1 tablet (10 mg total) by mouth daily with breakfast. Qty: 90 tablet, Refills: 3    OXYGEN Inhale 3-5 L into the lungs daily. 3L Daily  4L Resting  5L Exertion    potassium chloride SA (K-DUR,KLOR-CON) 20 MEQ tablet Take 1 tablet (20 mEq total) by mouth daily. Qty: 180 tablet, Refills: 3      STOP taking these medications     furosemide (LASIX) 80 MG tablet      spironolactone (ALDACTONE) 25 MG tablet      glucose blood (ONE TOUCH ULTRA TEST) test strip        Allergies  Allergen Reactions  . Codeine     Altered mental status "goofy"  . Heparin Rash     Abdominal rash 01/2013   Follow-up Information    Follow up with Kate Sable, MD.   Specialty:  Cardiology   Contact  information:   [place]  [id number] [phone]        The results of significant diagnostics from this hospitalization (including imaging, microbiology, ancillary and laboratory) are listed below for reference.    Significant Diagnostic Studies: Dg Chest 2 View  12/23/2015  CLINICAL DATA:  Shortness of breath.  History of CHF. EXAM: CHEST  2 VIEW COMPARISON:  01/06/2013 FINDINGS: Again noted is enlargement of the cardiac silhouette. There is no evidence for pulmonary edema or focal airspace disease. No large pleural effusions. Degenerative changes in the thoracic spine. No acute bone abnormality. IMPRESSION: No acute chest findings.  No evidence for pulmonary edema. Electronically Signed   By: Markus Daft M.D.   On: 12/23/2015 16:15  Microbiology: Recent Results (from the past 240 hour(s))  Urine culture     Status: Abnormal   Collection Time: 12/23/15  4:00 PM  Result Value Ref Range Status   Specimen Description URINE, CLEAN CATCH  Final   Special Requests NONE  Final   Culture MULTIPLE SPECIES PRESENT, SUGGEST RECOLLECTION (A)  Final   Report Status 12/26/2015 FINAL  Final     Labs: Basic Metabolic Panel:  Recent Labs Lab 12/23/15 1531 12/24/15 0500 12/25/15 0520 12/26/15 0555  NA 140 142 141 141  K 3.7 3.4* 3.7 4.1  CL 93* 95* 95* 95*  CO2 39* 39* 39* 41*  GLUCOSE 126* 154* 153* 161*  BUN 18 18 22* 23*  CREATININE 1.03 1.02 0.94 1.11  CALCIUM 9.0 8.7* 8.8* 8.8*  MG 1.8  --   --   --    Liver Function Tests:  Recent Labs Lab 12/23/15 1531  AST 13*  ALT 14*  ALKPHOS 74  BILITOT 0.8  PROT 6.9  ALBUMIN 3.2*   CBC:  Recent Labs Lab 12/23/15 1531  WBC 12.6*  NEUTROABS 8.7*  HGB 15.2  HCT 51.0  MCV 95.3  PLT 261   Cardiac Enzymes:  Recent Labs Lab 12/23/15 1531  TROPONINI <0.03   BNP: BNP (last 3 results)  Recent Labs  12/23/15 1531  BNP 197.0*     CBG:  Recent Labs Lab 12/26/15 1217 12/26/15 1657 12/26/15 2103  12/27/15 0809 12/27/15 1141  GLUCAP 173* 115* 158* 149* 140*    Signed:  Shenika Quint MD. FACP [facility] 12/27/2015, 2:43 PM

## 2015-12-29 ENCOUNTER — Telehealth: Payer: Self-pay | Admitting: *Deleted

## 2015-12-29 NOTE — Telephone Encounter (Signed)
Called pt to set up TCM appt no answer LMOM RTC.../lmb 

## 2015-12-30 ENCOUNTER — Other Ambulatory Visit: Payer: Self-pay | Admitting: Cardiology

## 2015-12-30 NOTE — Telephone Encounter (Signed)
Called pt again still no answer LMOM to call to make hosp f/u appt ASAP.Marland KitchenJohny Chess

## 2015-12-31 NOTE — Telephone Encounter (Signed)
Transition Care Management Follow-up Telephone Call pt called back completed call below.../lmb   Date discharged? 12/27/15   How have you been since you were released from the hospital? Pt states he is doing ok   Do you understand why you were in the hospital? YES   Do you understand the discharge instructions? YES   Where were you discharged to? Home   Items Reviewed:  Medications reviewed: YES  Allergies reviewed: YES  Dietary changes reviewed: YES, heart healthy  Referrals reviewed: YES, will be f/u with cardiology    Functional Questionnaire:   Activities of Daily Living (ADLs):   He states he are independent in the following: ambulation, bathing and hygiene, feeding, continence, grooming, toileting and dressing States he require assistance with the following: ambulation   Any transportation issues/concerns?: YES   Any patient concerns? NO   Confirmed importance and date/time of follow-up visits scheduled YES, APPT 01/01/16  Provider Appointment booked with Dr. Jenny Reichmann  Confirmed with patient if condition begins to worsen call PCP or go to the ER.  Patient was given the office number and encouraged to call back with question or concerns.  : YES

## 2016-01-01 ENCOUNTER — Other Ambulatory Visit (INDEPENDENT_AMBULATORY_CARE_PROVIDER_SITE_OTHER): Payer: Medicare Other

## 2016-01-01 ENCOUNTER — Ambulatory Visit (INDEPENDENT_AMBULATORY_CARE_PROVIDER_SITE_OTHER): Payer: Medicare Other | Admitting: Internal Medicine

## 2016-01-01 VITALS — BP 122/72 | HR 70 | Resp 20 | Wt 287.0 lb

## 2016-01-01 DIAGNOSIS — I509 Heart failure, unspecified: Secondary | ICD-10-CM | POA: Diagnosis not present

## 2016-01-01 DIAGNOSIS — Z5189 Encounter for other specified aftercare: Secondary | ICD-10-CM | POA: Diagnosis not present

## 2016-01-01 DIAGNOSIS — I50811 Acute right heart failure: Secondary | ICD-10-CM

## 2016-01-01 DIAGNOSIS — I10 Essential (primary) hypertension: Secondary | ICD-10-CM

## 2016-01-01 DIAGNOSIS — E0800 Diabetes mellitus due to underlying condition with hyperosmolarity without nonketotic hyperglycemic-hyperosmolar coma (NKHHC): Secondary | ICD-10-CM

## 2016-01-01 LAB — BASIC METABOLIC PANEL
BUN: 21 mg/dL (ref 6–23)
CO2: 43 mEq/L — ABNORMAL HIGH (ref 19–32)
Calcium: 9.8 mg/dL (ref 8.4–10.5)
Chloride: 95 mEq/L — ABNORMAL LOW (ref 96–112)
Creatinine, Ser: 1.13 mg/dL (ref 0.40–1.50)
GFR: 68.18 mL/min (ref >60.00)
Glucose, Bld: 90 mg/dL (ref 70–99)
Potassium: 4.8 mEq/L (ref 3.5–5.1)
Sodium: 136 mEq/L (ref 135–145)

## 2016-01-01 MED ORDER — METFORMIN HCL ER 500 MG PO TB24: 500.0000 mg | ORAL_TABLET | Freq: Every day | ORAL | Status: DC

## 2016-01-01 NOTE — Assessment & Plan Note (Signed)
Less well controlled in the past 2 mo, baseline has been at 7, now less active on Home o2; morbid obesity persists though tyring to work on diet and activity, will add metformin ER 500 qd,  to f/u any worsening symptoms or concerns

## 2016-01-01 NOTE — Assessment & Plan Note (Signed)
Wt overall stable on current diuretic, for f/u BMP today, cont to monitor wt, K, Cr, and has f/u appt Card July 2017

## 2016-01-01 NOTE — Progress Notes (Signed)
Pre visit review using our clinic review tool, if applicable. No additional management support is needed unless otherwise documented below in the visit note. 

## 2016-01-01 NOTE — Progress Notes (Signed)
Subjective:    Patient ID: [name] [name], male    DOB: 1945-09-02, 70 y.o.   MRN: [id number]  HPI  Here to f/u, the first day after seen last her had a hard fall to knees in church parking lot, since then trying to get more recouped, has been sitting more just about all day and all night in a recliner chair, then retained fluid that ended up mostly from the knees to the waist .eventually sob and hospd for diuresis, wt down from 296 to 288 and swelling much improved.  Ambulaotry again on Home o2 3L at rest, 4 at night, and 5L with exertion., sees Dr Halford Chessman.  Did not require PT during hospn, Does not have HH, manages the oxygen himself with swap out tanks by going to the facility. No fever, ST, cough, wheezing, and Pt denies chest pain, increased sob or doe, wheezing, orthopnea, PND, increased LE swelling, palpitations, dizziness or syncope.  Diuretic changed to demadex, good compliance, had echo with normal EF, has card f/u July 2017.  Wt overall stable since hosp d/c Wt Readings from Last 3 Encounters:  01/01/16 287 lb (130.182 kg)  12/27/15 288 lb 11.2 oz (130.953 kg)  12/23/15 296 lb 9.6 oz (134.537 kg)   Past Medical History  Diagnosis Date  . HYPERLIPIDEMIA   . GOUT   . Morbid obesity ([place])   . OBESITY HYPOVENTILATION SYNDROME     a. on home O2.  Marland Kitchen Unspecified hearing loss   . HYPERTENSION   . BRONCHITIS, CHRONIC 01/02/2009  . SKIN RASH 03/12/2010  . PROSTATE SPECIFIC ANTIGEN, ELEVATED 06/09/2007  . Long term (current) use of anticoagulants   . Complication of anesthesia     "aspiration pneumonia after hand OR" (01/05/2013)  . Permanent atrial fibrillation ([place])   . OSA (obstructive sleep apnea)   . DIABETES MELLITUS, TYPE II   . COMMON MIGRAINE     "none since treating for high BP"  . Arthritis     "knees and hands; thoracic area of the spine" (01/05/2013)  . On home oxygen therapy   . Acute right-sided CHF (congestive heart failure) ([place])     a. 01/2013.  . Pulmonary HTN ([place])     a.  multifactorial including obstructive sleep apnea, obesity hypoventilation syndrome and possible pulmonary venous hypertension.   Past Surgical History  Procedure Laterality Date  . Appendectomy  1974  . Tendon repair Right 2009    "lacerated his tendon and pulley" Dr. Lenon Curt  . Cardioversion  05/21/2008; ~ 06/2008  . Inguinal hernia repair Right   . Hernia repair    . Umbilical hernia repair    . Right heart catheterization N/A 01/08/2013    Procedure: RIGHT HEART CATH;  Surgeon: Thayer Headings, MD;  [facility] CATH LAB;  Service: Cardiovascular;  Laterality: N/A;    reports that he quit smoking about 37 years ago. His smoking use included Cigarettes. He has a 15 pack-year smoking history. He has never used smokeless tobacco. He reports that he drinks alcohol. He reports that he does not use illicit drugs. family history includes Alzheimer's disease in his father; Cancer in his father and other; Coronary artery disease in his other. Allergies  Allergen Reactions  . Codeine     Altered mental status "goofy"  . Heparin Rash     Abdominal rash 01/2013   Current Outpatient Prescriptions on File Prior to Visit  Medication Sig Dispense Refill  . allopurinol (ZYLOPRIM) 100 MG tablet TAKE  1 TABLET DAILY 90 tablet 1  . atorvastatin (LIPITOR) 20 MG tablet TAKE 1 TABLET DAILY 90 tablet 2  . cetirizine (ZYRTEC) 10 MG tablet Take 10 mg by mouth at bedtime.    Marland Kitchen diltiazem (CARDIZEM CD) 180 MG 24 hr capsule Take 1 capsule (180 mg total) by mouth daily. 90 capsule 2  . ELIQUIS 5 MG TABS tablet TAKE 1 TABLET TWICE A DAY 180 tablet 1  . glipiZIDE (GLIPIZIDE XL) 10 MG 24 hr tablet Take 1 tablet (10 mg total) by mouth daily with breakfast. 90 tablet 3  . KLOR-CON M20 20 MEQ tablet TAKE 1 TABLET TWICE A DAY 180 tablet 3  . metoprolol tartrate (LOPRESSOR) 25 MG tablet Take 1 tablet (25 mg total) by mouth 2 (two) times daily. 60 tablet 1  . OXYGEN Inhale 3-5 L into the lungs daily. 3L Daily  4L Resting    5L Exertion    . torsemide (DEMADEX) 20 MG tablet Take 2 tablets (40 mg total) by mouth 2 (two) times daily. 120 tablet 1   No current facility-administered medications on file prior to visit.   Review of Systems  Constitutional: Negative for unusual diaphoresis or night sweats HENT: Negative for ear swelling or discharge Eyes: Negative for worsening visual haziness  Respiratory: Negative for choking and stridor.   Gastrointestinal: Negative for distension or worsening eructation Genitourinary: Negative for retention or change in urine volume.  Musculoskeletal: Negative for other MSK pain or swelling Skin: Negative for color change and worsening wound Neurological: Negative for tremors and numbness other than noted  Psychiatric/Behavioral: Negative for decreased concentration or agitation other than above       Objective:   Physical Exam BP 122/72 mmHg  Pulse 70  Resp 20  Wt 287 lb (130.182 kg)  SpO2 91% VS noted,  Constitutional: Pt appears in no apparent distress HENT: Head: NCAT.  Right Ear: External ear normal.  Left Ear: External ear normal.  Eyes: . Pupils are equal, round, and reactive to light. Conjunctivae and EOM are normal Neck: Normal range of motion. Neck supple.  Cardiovascular: Normal rate and regular rhythm.   Pulmonary/Chest: Effort normal and breath sounds without rales or wheezing.  Abd:  Soft, NT, ND, + BS Neurological: Pt is alert. Not confused , motor grossly intact Skin: Skin is warm. No rash, trace prob chronic bilat ankle edema Psychiatric: Pt behavior is normal. No agitation.   Lab Results  Component Value Date   WBC 12.6* 12/23/2015   HGB 15.2 12/23/2015   HCT 51.0 12/23/2015   PLT 261 12/23/2015   GLUCOSE 161* 12/26/2015   CHOL 108 10/14/2015   TRIG 151.0* 10/14/2015   HDL 38.50* 10/14/2015   LDLCALC 39 10/14/2015   ALT 14* 12/23/2015   AST 13* 12/23/2015   NA 141 12/26/2015   K 4.1 12/26/2015   CL 95* 12/26/2015   CREATININE 1.11  12/26/2015   BUN 23* 12/26/2015   CO2 41* 12/26/2015   TSH 1.53 04/15/2015   PSA 1.25 04/15/2015   INR 1.25 01/09/2013   HGBA1C 8.2* 12/23/2015   MICROALBUR <0.7 04/15/2015    Most recent Echo: Dec 24, 2015 Result status: Final result      *[address]ies[address], [place] [address]  [id number]  ------------------------------------------------------------------- Transthoracic Echocardiography  Patient: Gradyn, Feik MR #: [id number] Study Date: 12/24/2015 Gender: M Age: 38 Height: 172.7 cm Weight: 113.2 kg BSA: 2.38 m^2 Pt. Status: Room: A327  ATTENDING Richarda Blade SONOGRAPHER Leavy Cella PERFORMING [facility] ADMITTING Opyd, Timothy S ORDERING Opyd, Timothy S REFERRING Opyd, Timothy S  cc:  ------------------------------------------------------------------- LV EF: 55% - 60%  ------------------------------------------------------------------- Indications: Dyspnea 786.09.  ------------------------------------------------------------------- History: PMH: Former Smoker. PHTN. Atrial fibrillation. Congestive heart failure. Risk factors: Hypertension. Diabetes mellitus. Dyslipidemia.  ------------------------------------------------------------------- Study Conclusions  - Procedure narrative: Transthoracic echocardiography. Image  quality was suboptimal, with poor valvular and endocardial  visualization. - Left ventricle: The cavity size was normal. Systolic function was  normal. The estimated ejection fraction was in the range of 55%  to 60%. Images were inadequate for LV wall motion assessment. The  study was not technically sufficient to allow evaluation of LV  diastolic dysfunction due to atrial fibrillation. - Left atrium: The atrium  was severely dilated. - Right atrium: The atrium was mildly dilated. - Pericardium, extracardiac: Small posterior pericardial effusion.       Assessment & Plan:

## 2016-01-01 NOTE — Patient Instructions (Addendum)
Please take all new medication as prescribed - the metformin ER 500 mg - 1 per day ([facility])   Please continue all other medications as before, and refills have been done if requested.  Please have the pharmacy call with any other refills you may need.  Please continue your efforts at being more active, low cholesterol diabetic diet, and weight control.  Please keep your appointments with your specialists as you may have planned  Please go to the LAB in the Basement (turn left off the elevator) for the tests to be done today  You will be contacted by phone if any changes need to be made immediately.  Otherwise, you will receive a letter about your results with an explanation, but please check with MyChart first.  Please remember to sign up for MyChart if you have not done so, as this will be important to you in the future with finding out test results, communicating by private email, and scheduling acute appointments online when needed.

## 2016-01-01 NOTE — Assessment & Plan Note (Signed)
stable overall by history and exam, recent data reviewed with pt, and pt to continue medical treatment as before,  to f/u any worsening symptoms or concerns l BP Readings from Last 3 Encounters:  01/01/16 122/72  12/27/15 112/61  12/23/15 122/74

## 2016-01-08 ENCOUNTER — Other Ambulatory Visit: Payer: Self-pay | Admitting: Cardiology

## 2016-01-08 NOTE — Telephone Encounter (Signed)
Rx(s) sent to pharmacy electronically.  

## 2016-01-29 NOTE — Progress Notes (Signed)
HPI: FU permanent AFib and right side CHF. He has failed DCCV in the past. Myoview 7/09: EF 54%, normal perfusion. Holter 09/2010: AFib with mildly decreased rate. Right heart failure felt secondary to pulmonary hypertension which is multifactorial including OSA, OHS and possible pulmonary venous hypertension. Alexander 01/08/13 demonstrated elevated R heart pressures (RA 15, RV 44/11, PCWP 21, PA 41/30, CO 4.49, CI 1.8, PA sat 61%). Patient has had some degree of renal insufficiency and hyperkalemia on a combination of ACE inhibitor and spironolactone. Last echo 5/17 showed EF 55-60, biatrial enlargement, small pericardial effusion. Admitted May 2017 with worsening congestive heart failure. Diuresed with improvement. Since last seen He has dyspnea on exertion but denies orthopnea or PND. His weight has been stable. No chest pain or syncope. No bleeding.  Current Outpatient Prescriptions  Medication Sig Dispense Refill  . allopurinol (ZYLOPRIM) 100 MG tablet TAKE 1 TABLET DAILY 90 tablet 1  . atorvastatin (LIPITOR) 20 MG tablet Take 1 tablet (20 mg total) by mouth daily. 90 tablet 0  . cetirizine (ZYRTEC) 10 MG tablet Take 10 mg by mouth at bedtime.    Marland Kitchen diltiazem (CARDIZEM CD) 180 MG 24 hr capsule Take 1 capsule (180 mg total) by mouth daily. 90 capsule 2  . ELIQUIS 5 MG TABS tablet TAKE 1 TABLET TWICE A DAY 180 tablet 1  . glipiZIDE (GLIPIZIDE XL) 10 MG 24 hr tablet Take 1 tablet (10 mg total) by mouth daily with breakfast. 90 tablet 3  . KLOR-CON M20 20 MEQ tablet TAKE 1 TABLET TWICE A DAY 180 tablet 3  . metFORMIN (GLUCOPHAGE XR) 500 MG 24 hr tablet Take 1 tablet (500 mg total) by mouth daily with breakfast. 90 tablet 3  . metoprolol tartrate (LOPRESSOR) 25 MG tablet Take 1 tablet (25 mg total) by mouth 2 (two) times daily. 60 tablet 1  . OXYGEN Inhale 3-5 L into the lungs daily. 3L Daily  4L Resting  5L Exertion    . torsemide (DEMADEX) 20 MG tablet Take 2 tablets (40 mg total) by mouth 2 (two)  times daily. 120 tablet 1   No current facility-administered medications for this visit.     Past Medical History  Diagnosis Date  . HYPERLIPIDEMIA   . GOUT   . Morbid obesity ([place])   . OBESITY HYPOVENTILATION SYNDROME     a. on home O2.  Marland Kitchen Unspecified hearing loss   . HYPERTENSION   . BRONCHITIS, CHRONIC 01/02/2009  . SKIN RASH 03/12/2010  . PROSTATE SPECIFIC ANTIGEN, ELEVATED 06/09/2007  . Long term (current) use of anticoagulants   . Complication of anesthesia     "aspiration pneumonia after hand OR" (01/05/2013)  . Permanent atrial fibrillation ([place])   . OSA (obstructive sleep apnea)   . DIABETES MELLITUS, TYPE II   . COMMON MIGRAINE     "none since treating for high BP"  . Arthritis     "knees and hands; thoracic area of the spine" (01/05/2013)  . On home oxygen therapy   . Acute right-sided CHF (congestive heart failure) ([place])     a. 01/2013.  . Pulmonary HTN ([place])     a. multifactorial including obstructive sleep apnea, obesity hypoventilation syndrome and possible pulmonary venous hypertension.    Past Surgical History  Procedure Laterality Date  . Appendectomy  1974  . Tendon repair Right 2009    "lacerated his tendon and pulley" Dr. Lenon Curt  . Cardioversion  05/21/2008; ~ 06/2008  . Inguinal hernia repair  Right   . Hernia repair    . Umbilical hernia repair    . Right heart catheterization N/A 01/08/2013    Procedure: RIGHT HEART CATH;  Surgeon: Thayer Headings, MD;  Location: [facility] CATH LAB;  Service: Cardiovascular;  Laterality: N/A;    Social History   Social History  . Marital Status: Married    Spouse Name: N/A  . Number of Children: 2  . Years of Education: N/A   Occupational History  . sheet metal Public librarian    Social History Main Topics  . Smoking status: Former Smoker -- 1.00 packs/day for 15 years    Types: Cigarettes    Quit date: 08/09/1978  . Smokeless tobacco: Never Used     Comment: 01/05/2013 "quit smoking ~ 40 years ago"  . Alcohol  Use: 0.0 oz/week    0 Standard drinks or equivalent per week     Comment: 01/05/2013 "hasn't had a drink since 1980's; never had problem w/it"  . Drug Use: No  . Sexual Activity: Not Currently   Other Topics Concern  . Not on file   Social History Narrative    Family History  Problem Relation Age of Onset  . Alzheimer's disease Father   . Cancer Father     Prostate Cancer  . Cancer Other     Breast Cancer, <50 yo 1st degree relative  . Coronary artery disease Other     Male, 1st degree relative    ROS: no fevers or chills, productive cough, hemoptysis, dysphasia, odynophagia, melena, hematochezia, dysuria, hematuria, rash, seizure activity, orthopnea, PND, claudication. Remaining systems are negative.  Physical Exam: Well-developed obese in no acute distress.  Skin is warm and dry.  HEENT is normal.  Neck is supple.  Chest is clear to auscultation with normal expansion.  Cardiovascular exam is irregular Abdominal exam nontender or distended. No masses palpated. Extremities show 1+ edema and chronic skin changes. neuro grossly intact   Assessment and plan 1 chronic diastolic congestive heart failure/right heart failure-multifactorial including obesity hypoventilation syndrome, sleep apnea and pulmonary venous hypertension. Volume status appears to be stable. Continue present dose of Demadex. Patient instructed on low sodium diet and fluid restriction. He is weighing daily and will take an additional 20 mg of Demadex for weight gain of 2-3 pounds. Check potassium and renal function. 2 permanent atrial fibrillation-continue Cardizem and metoprolol for rate control. Continue apixaban. Check potassium and renal function. 3 hypertension-continue present blood pressure medications. 4 hyperlipidemia-continue statin. Kirk Ruths, MD

## 2016-02-09 ENCOUNTER — Encounter: Payer: Self-pay | Admitting: Cardiology

## 2016-02-09 ENCOUNTER — Ambulatory Visit (INDEPENDENT_AMBULATORY_CARE_PROVIDER_SITE_OTHER): Payer: Medicare Other | Admitting: Cardiology

## 2016-02-09 VITALS — BP 102/80 | HR 85 | Ht 68.0 in | Wt 288.8 lb

## 2016-02-09 DIAGNOSIS — E785 Hyperlipidemia, unspecified: Secondary | ICD-10-CM

## 2016-02-09 DIAGNOSIS — I482 Chronic atrial fibrillation, unspecified: Secondary | ICD-10-CM

## 2016-02-09 DIAGNOSIS — I10 Essential (primary) hypertension: Secondary | ICD-10-CM | POA: Diagnosis not present

## 2016-02-09 DIAGNOSIS — I2781 Cor pulmonale (chronic): Secondary | ICD-10-CM

## 2016-02-09 LAB — BASIC METABOLIC PANEL
BUN: 19 mg/dL (ref 7–25)
CALCIUM: 8.9 mg/dL (ref 8.6–10.3)
CO2: 33 mmol/L — ABNORMAL HIGH (ref 20–31)
CREATININE: 0.92 mg/dL (ref 0.70–1.18)
Chloride: 100 mmol/L (ref 98–110)
Glucose, Bld: 153 mg/dL — ABNORMAL HIGH (ref 65–99)
Potassium: 4.4 mmol/L (ref 3.5–5.3)
Sodium: 144 mmol/L (ref 135–146)

## 2016-02-09 NOTE — Patient Instructions (Signed)
Medication Instructions:   NO CHANGE  Labwork:  Your physician recommends that you HAVE LAB WORK TODAY  Follow-Up:  Your physician recommends that you schedule a follow-up appointment in: 3 MONTHS WITH DR CRENSHAW      

## 2016-02-22 ENCOUNTER — Other Ambulatory Visit: Payer: Self-pay | Admitting: Cardiology

## 2016-02-23 MED ORDER — METOPROLOL TARTRATE 25 MG PO TABS: 25.0000 mg | ORAL_TABLET | Freq: Two times a day (BID) | ORAL | Status: DC

## 2016-02-23 NOTE — Telephone Encounter (Signed)
Rx(s) sent to pharmacy electronically.  

## 2016-02-24 ENCOUNTER — Other Ambulatory Visit: Payer: Self-pay | Admitting: Cardiology

## 2016-03-16 ENCOUNTER — Other Ambulatory Visit: Payer: Self-pay

## 2016-03-16 MED ORDER — ALLOPURINOL 100 MG PO TABS: 100.0000 mg | ORAL_TABLET | Freq: Every day | ORAL | 1 refills | Status: DC

## 2016-03-16 NOTE — Progress Notes (Signed)
Medication refill sent to pharmacy  

## 2016-03-20 ENCOUNTER — Other Ambulatory Visit: Payer: Self-pay | Admitting: Cardiology

## 2016-04-07 ENCOUNTER — Other Ambulatory Visit: Payer: Self-pay | Admitting: Cardiology

## 2016-04-10 ENCOUNTER — Other Ambulatory Visit: Payer: Self-pay | Admitting: Cardiology

## 2016-04-13 ENCOUNTER — Encounter: Payer: Self-pay | Admitting: Internal Medicine

## 2016-04-13 ENCOUNTER — Ambulatory Visit (INDEPENDENT_AMBULATORY_CARE_PROVIDER_SITE_OTHER): Payer: Medicare Other | Admitting: Internal Medicine

## 2016-04-13 ENCOUNTER — Other Ambulatory Visit (INDEPENDENT_AMBULATORY_CARE_PROVIDER_SITE_OTHER): Payer: Medicare Other

## 2016-04-13 VITALS — BP 132/72 | HR 87 | Temp 98.7°F | Resp 20 | Wt 286.0 lb

## 2016-04-13 DIAGNOSIS — E785 Hyperlipidemia, unspecified: Secondary | ICD-10-CM | POA: Diagnosis not present

## 2016-04-13 DIAGNOSIS — I10 Essential (primary) hypertension: Secondary | ICD-10-CM

## 2016-04-13 DIAGNOSIS — Z23 Encounter for immunization: Secondary | ICD-10-CM | POA: Diagnosis not present

## 2016-04-13 DIAGNOSIS — E0800 Diabetes mellitus due to underlying condition with hyperosmolarity without nonketotic hyperglycemic-hyperosmolar coma (NKHHC): Secondary | ICD-10-CM

## 2016-04-13 DIAGNOSIS — R972 Elevated prostate specific antigen [PSA]: Secondary | ICD-10-CM | POA: Diagnosis not present

## 2016-04-13 LAB — URINALYSIS, ROUTINE W REFLEX MICROSCOPIC
Bilirubin Urine: NEGATIVE
HGB URINE DIPSTICK: NEGATIVE
Ketones, ur: NEGATIVE
Leukocytes, UA: NEGATIVE
Nitrite: NEGATIVE
SPECIFIC GRAVITY, URINE: 1.01 (ref 1.000–1.030)
Total Protein, Urine: NEGATIVE
Urine Glucose: NEGATIVE
Urobilinogen, UA: 0.2 (ref 0.0–1.0)
pH: 7.5 (ref 5.0–8.0)

## 2016-04-13 LAB — CBC WITH DIFFERENTIAL/PLATELET
BASOS ABS: 0 10*3/uL (ref 0.0–0.1)
BASOS PCT: 0.3 % (ref 0.0–3.0)
Eosinophils Absolute: 0.2 10*3/uL (ref 0.0–0.7)
Eosinophils Relative: 1.9 % (ref 0.0–5.0)
HCT: 47.2 % (ref 39.0–52.0)
Hemoglobin: 15.6 g/dL (ref 13.0–17.0)
Lymphocytes Relative: 20.3 % (ref 12.0–46.0)
Lymphs Abs: 2.2 10*3/uL (ref 0.7–4.0)
MCHC: 33 g/dL (ref 30.0–36.0)
MCV: 84.6 fl (ref 78.0–100.0)
MONO ABS: 0.7 10*3/uL (ref 0.1–1.0)
Monocytes Relative: 6.3 % (ref 3.0–12.0)
NEUTROS PCT: 71.2 % (ref 43.0–77.0)
Neutro Abs: 7.9 10*3/uL — ABNORMAL HIGH (ref 1.4–7.7)
PLATELETS: 253 10*3/uL (ref 150.0–400.0)
RBC: 5.58 Mil/uL (ref 4.22–5.81)
RDW: 17.6 % — AB (ref 11.5–15.5)
WBC: 11.1 10*3/uL — AB (ref 4.0–10.5)

## 2016-04-13 LAB — BASIC METABOLIC PANEL
BUN: 17 mg/dL (ref 6–23)
CHLORIDE: 99 meq/L (ref 96–112)
CO2: 40 meq/L — AB (ref 19–32)
CREATININE: 1.02 mg/dL (ref 0.40–1.50)
Calcium: 9.3 mg/dL (ref 8.4–10.5)
GFR: 76.67 mL/min (ref >60.00)
GLUCOSE: 152 mg/dL — AB (ref 70–99)
POTASSIUM: 4.4 meq/L (ref 3.5–5.1)
Sodium: 144 mEq/L (ref 135–145)

## 2016-04-13 LAB — HEPATIC FUNCTION PANEL
ALBUMIN: 3.6 g/dL (ref 3.5–5.2)
ALT: 11 U/L (ref 0–53)
AST: 11 U/L (ref 0–37)
Alkaline Phosphatase: 78 U/L (ref 39–117)
Bilirubin, Direct: 0.2 mg/dL (ref 0.0–0.3)
TOTAL PROTEIN: 6.9 g/dL (ref 6.0–8.3)
Total Bilirubin: 1.2 mg/dL (ref 0.2–1.2)

## 2016-04-13 LAB — HEMOGLOBIN A1C: Hgb A1c MFr Bld: 6.8 % — ABNORMAL HIGH (ref 4.6–6.5)

## 2016-04-13 LAB — LIPID PANEL
CHOLESTEROL: 117 mg/dL (ref 0–200)
HDL: 37.8 mg/dL — AB (ref >39.00)
LDL Cholesterol: 46 mg/dL (ref 0–99)
NonHDL: 79.03
TRIGLYCERIDES: 165 mg/dL — AB (ref 0.0–149.0)
Total CHOL/HDL Ratio: 3
VLDL: 33 mg/dL (ref 0.0–40.0)

## 2016-04-13 LAB — MICROALBUMIN / CREATININE URINE RATIO
CREATININE, U: 11.8 mg/dL
MICROALB/CREAT RATIO: 8.5 mg/g (ref 0.0–30.0)
Microalb, Ur: 1 mg/dL (ref 0.0–1.9)

## 2016-04-13 LAB — TSH: TSH: 1.24 u[IU]/mL (ref 0.35–4.50)

## 2016-04-13 LAB — PSA: PSA: 1.5 ng/mL (ref 0.10–4.00)

## 2016-04-13 MED ORDER — METFORMIN HCL ER 500 MG PO TB24: 1000.0000 mg | ORAL_TABLET | Freq: Every day | ORAL | 3 refills | Status: DC

## 2016-04-13 NOTE — Progress Notes (Signed)
Subjective:    Patient ID: [name] [name], male    DOB: 1945-10-04, 70 y.o.   MRN: [id number]  HPI   Here for yearly f/u;  Overall doing ok;  Pt denies Chest pain, worsening SOB, DOE, wheezing, orthopnea, PND, worsening LE edema, palpitations, dizziness or syncope.  Pt denies neurological change such as new headache, facial or extremity weakness.  Pt denies polydipsia, polyuria, or low sugar symptoms. Pt states overall good compliance with treatment and medications, good tolerability, and has been trying to follow appropriate diet.  Pt denies worsening depressive symptoms, suicidal ideation or panic. No fever, night sweats, wt loss, loss of appetite, or other constitutional symptoms.  Pt states good ability with ADL's, has low fall risk, home safety reviewed and adequate, no other significant changes in hearing or vision, and only occasionally active with exercise.  Occasionally walks about the house without his home o2 but family always makes sure he gets it back only  Denies urinary symptoms such as dysuria, frequency, urgency, flank pain, hematuria or n/v, fever, chills. Wt Readings from Last 3 Encounters:  04/13/16 286 lb (129.7 kg)  02/09/16 288 lb 12.8 oz (131 kg)  01/01/16 287 lb (130.2 kg)   Past Medical History:  Diagnosis Date  . Acute right-sided CHF (congestive heart failure) ([place])    a. 01/2013.  . Arthritis    "knees and hands; thoracic area of the spine" (01/05/2013)  . BRONCHITIS, CHRONIC 01/02/2009  . COMMON MIGRAINE    "none since treating for high BP"  . Complication of anesthesia    "aspiration pneumonia after hand OR" (01/05/2013)  . DIABETES MELLITUS, TYPE II   . GOUT   . HYPERLIPIDEMIA   . HYPERTENSION   . Long term (current) use of anticoagulants   . Morbid obesity ([place])   . OBESITY HYPOVENTILATION SYNDROME    a. on home O2.  . On home oxygen therapy   . OSA (obstructive sleep apnea)   . Permanent atrial fibrillation ([place])   . PROSTATE SPECIFIC ANTIGEN, ELEVATED  06/09/2007  . Pulmonary HTN ([place])    a. multifactorial including obstructive sleep apnea, obesity hypoventilation syndrome and possible pulmonary venous hypertension.  Marland Kitchen SKIN RASH 03/12/2010  . Unspecified hearing loss    Past Surgical History:  Procedure Laterality Date  . APPENDECTOMY  1974  . CARDIOVERSION  05/21/2008; ~ 06/2008  . HERNIA REPAIR    . INGUINAL HERNIA REPAIR Right   . RIGHT HEART CATHETERIZATION N/A 01/08/2013   Procedure: RIGHT HEART CATH;  Surgeon: Thayer Headings, MD;  Location: [facility] CATH LAB;  Service: Cardiovascular;  Laterality: N/A;  . TENDON REPAIR Right 2009   "lacerated his tendon and pulley" Dr. Lenon Curt  . UMBILICAL HERNIA REPAIR      reports that he quit smoking about 37 years ago. His smoking use included Cigarettes. He has a 15.00 pack-year smoking history. He has never used smokeless tobacco. He reports that he drinks alcohol. He reports that he does not use drugs. family history includes Alzheimer's disease in his father; Cancer in his father and other; Coronary artery disease in his other. Allergies  Allergen Reactions  . Codeine     Altered mental status "goofy"  . Heparin Rash     Abdominal rash 01/2013   Current Outpatient Prescriptions on File Prior to Visit  Medication Sig Dispense Refill  . allopurinol (ZYLOPRIM) 100 MG tablet Take 1 tablet (100 mg total) by mouth daily. 90 tablet 1  . atorvastatin (LIPITOR) 20  MG tablet Take 1 tablet (20 mg total) by mouth daily. 90 tablet 2  . cetirizine (ZYRTEC) 10 MG tablet Take 10 mg by mouth at bedtime.    Marland Kitchen diltiazem (CARDIZEM CD) 180 MG 24 hr capsule Take 1 capsule (180 mg total) by mouth daily. 90 capsule 3  . ELIQUIS 5 MG TABS tablet TAKE 1 TABLET TWICE A DAY 180 tablet 1  . glipiZIDE (GLIPIZIDE XL) 10 MG 24 hr tablet Take 1 tablet (10 mg total) by mouth daily with breakfast. 90 tablet 3  . KLOR-CON M20 20 MEQ tablet TAKE 1 TABLET TWICE A DAY 180 tablet 3  . metoprolol tartrate (LOPRESSOR) 25 MG tablet  Take 1 tablet (25 mg total) by mouth 2 (two) times daily. 180 tablet 3  . OXYGEN Inhale 3-5 L into the lungs daily. 3L Daily  4L Resting  5L Exertion    . torsemide (DEMADEX) 20 MG tablet Take 2 tablets (40 mg total) by mouth 2 (two) times daily. 120 tablet 1   No current facility-administered medications on file prior to visit.     Review of Systems Constitutional: Negative for increased diaphoresis, or other activity, appetite or siginficant weight change other than noted HENT: Negative for worsening hearing loss, ear pain, facial swelling, mouth sores and neck stiffness.   Eyes: Negative for other worsening pain, redness or visual disturbance.  Respiratory: Negative for choking or stridor Cardiovascular: Negative for other chest pain and palpitations.  Gastrointestinal: Negative for worsening diarrhea, blood in stool, or abdominal distention Genitourinary: Negative for hematuria, flank pain or change in urine volume.  Musculoskeletal: Negative for myalgias or other joint complaints.  Skin: Negative for other color change and wound or drainage.  Neurological: Negative for syncope and numbness. other than noted Hematological: Negative for adenopathy. or other swelling Psychiatric/Behavioral: Negative for hallucinations, SI, self-injury, decreased concentration or other worsening agitation.      Objective:   Physical Exam BP 132/72   Pulse 87   Temp 98.7 F (37.1 C) (Oral)   Resp 20   Wt 286 lb (129.7 kg)   SpO2 94%   BMI 43.49 kg/m  VS noted, morbid obese  Constitutional: Pt is oriented to person, place, and time. Appears well-developed and well-nourished, in no significant distress Head: Normocephalic and atraumatic  Eyes: Conjunctivae and EOM are normal. Pupils are equal, round, and reactive to light Right Ear: External ear normal.  Left Ear: External ear normal Nose: Nose normal.  Mouth/Throat: Oropharynx is clear and moist  Neck: Normal range of motion. Neck supple. No  JVD present. No tracheal deviation present or significant neck LA or mass Cardiovascular: Normal rate, regular rhythm, normal heart sounds and intact distal pulses.   Pulmonary/Chest: Effort normal and breath sounds decreased without rales or wheezing  Abdominal: Soft. Bowel sounds are normal. NT. No HSM  Musculoskeletal: Normal range of motion. Exhibits with chronic 1+ bilat LE edema Lymphadenopathy: Has no cervical adenopathy.  Neurological: Pt is alert and oriented to person, place, and time. Pt has normal reflexes. No cranial nerve deficit. Motor grossly intact Skin: Skin is warm and dry. No rash noted or new ulcers Psychiatric:  Has normal mood and affect. Behavior is normal.  Lab Results  Component Value Date   WBC 12.6 (H) 12/23/2015   HGB 15.2 12/23/2015   HCT 51.0 12/23/2015   PLT 261 12/23/2015   GLUCOSE 153 (H) 02/09/2016   CHOL 108 10/14/2015   TRIG 151.0 (H) 10/14/2015   HDL 38.50 (L) 10/14/2015  LDLCALC 39 10/14/2015   ALT 14 (L) 12/23/2015   AST 13 (L) 12/23/2015   NA 144 02/09/2016   K 4.4 02/09/2016   CL 100 02/09/2016   CREATININE 0.92 02/09/2016   BUN 19 02/09/2016   CO2 33 (H) 02/09/2016   TSH 1.53 04/15/2015   PSA 1.25 04/15/2015   INR 1.25 01/09/2013   HGBA1C 8.2 (H) 12/23/2015   MICROALBUR <0.7 04/15/2015       Assessment & Plan:

## 2016-04-13 NOTE — Progress Notes (Signed)
Pre visit review using our clinic review tool, if applicable. No additional management support is needed unless otherwise documented below in the visit note. 

## 2016-04-13 NOTE — Assessment & Plan Note (Signed)
stable overall by history and exam, recent data reviewed with pt, and pt to continue medical treatment as before,  to f/u any worsening symptoms or concerns Lab Results  Component Value Date   PSA 1.25 04/15/2015   PSA 1.15 10/09/2013   PSA 1.24 07/11/2012   For f/u

## 2016-04-13 NOTE — Patient Instructions (Addendum)
Ok to increase the metformin ER 500 mg to 2 pills in the AM  Please continue all other medications as before, and refills have been done if requested.  Please have the pharmacy call with any other refills you may need.  Please continue your efforts at being more active, low cholesterol diet, and weight control.  You are otherwise up to date with prevention measures today.  Please keep your appointments with your specialists as you may have planned  Please go to the LAB in the Basement (turn left off the elevator) for the tests to be done today  You will be contacted by phone if any changes need to be made immediately.  Otherwise, you will receive a letter about your results with an explanation, but please check with MyChart first.  Please remember to sign up for MyChart if you have not done so, as this will be important to you in the future with finding out test results, communicating by private email, and scheduling acute appointments online when needed.  Please return in 6 months, or sooner if needed

## 2016-04-13 NOTE — Assessment & Plan Note (Signed)
stable overall by history and exam, recent data reviewed with pt, and pt to continue medical treatment as before,  to f/u any worsening symptoms or concerns BP Readings from Last 3 Encounters:  04/13/16 132/72  02/09/16 102/80  01/01/16 122/72

## 2016-04-13 NOTE — Assessment & Plan Note (Signed)
stable overall by history and exam, recent data reviewed with pt, and pt to continue medical treatment as before,  to f/u any worsening symptoms or concerns Lab Results  Component Value Date   LDLCALC 39 10/14/2015

## 2016-04-13 NOTE — Assessment & Plan Note (Signed)
Uncontrolled, for incresaed metformin ER to 2 x 500 mg in am, for f/u labs today, urged diet compliance Lab Results  Component Value Date   HGBA1C 8.2 (H) 12/23/2015

## 2016-04-19 ENCOUNTER — Encounter: Payer: Self-pay | Admitting: *Deleted

## 2016-04-29 NOTE — Progress Notes (Signed)
HPI: FU permanent AFib and right side CHF. He has failed DCCV in the past. Myoview 7/09: EF 54%, normal perfusion. Holter 09/2010: AFib with mildly decreased rate. Right heart failure felt secondary to pulmonary hypertension which is multifactorial including OSA, OHS and possible pulmonary venous hypertension. Jenn[place] 01/08/13 demonstrated elevated R heart pressures (RA 15, RV 44/11, PCWP 21, PA 41/30, CO 4.49, CI 1.8, PA sat 61%). Patient has had some degree of renal insufficiency and hyperkalemia on a combination of ACE inhibitor and spironolactone. Last echo 5/17 showed EF 55-60, biatrial enlargement, small pericardial effusion. Admitted May 2017 with worsening congestive heart failure. Diuresed with improvement. Since last seen he has some dyspnea on exertion but no orthopnea, PND, chest pain, palpitations, syncope or bleeding. He has chronic pedal edema unchanged.  Current Outpatient Prescriptions  Medication Sig Dispense Refill  . allopurinol (ZYLOPRIM) 100 MG tablet Take 1 tablet (100 mg total) by mouth daily. 90 tablet 1  . atorvastatin (LIPITOR) 20 MG tablet Take 1 tablet (20 mg total) by mouth daily. 90 tablet 2  . cetirizine (ZYRTEC) 10 MG tablet Take 10 mg by mouth at bedtime.    Marland Kitchen diltiazem (CARDIZEM CD) 180 MG 24 hr capsule Take 1 capsule (180 mg total) by mouth daily. 90 capsule 3  . ELIQUIS 5 MG TABS tablet TAKE 1 TABLET TWICE A DAY 180 tablet 1  . glipiZIDE (GLIPIZIDE XL) 10 MG 24 hr tablet Take 1 tablet (10 mg total) by mouth daily with breakfast. 90 tablet 3  . KLOR-CON M20 20 MEQ tablet TAKE 1 TABLET TWICE A DAY 180 tablet 3  . metFORMIN (GLUCOPHAGE XR) 500 MG 24 hr tablet Take 2 tablets (1,000 mg total) by mouth daily with breakfast. 180 tablet 3  . metoprolol tartrate (LOPRESSOR) 25 MG tablet Take 1 tablet (25 mg total) by mouth 2 (two) times daily. 180 tablet 3  . OXYGEN Inhale 3-5 L into the lungs daily. 3L Daily  4L Resting  5L Exertion    . torsemide (DEMADEX) 20 MG  tablet Take 2 tablets (40 mg total) by mouth 2 (two) times daily. 120 tablet 1   No current facility-administered medications for this visit.      Past Medical History:  Diagnosis Date  . Acute right-sided CHF (congestive heart failure) ([place])    a. 01/2013.  . Arthritis    "knees and hands; thoracic area of the spine" (01/05/2013)  . BRONCHITIS, CHRONIC 01/02/2009  . COMMON MIGRAINE    "none since treating for high BP"  . Complication of anesthesia    "aspiration pneumonia after hand OR" (01/05/2013)  . DIABETES MELLITUS, TYPE II   . GOUT   . HYPERLIPIDEMIA   . HYPERTENSION   . Long term (current) use of anticoagulants   . Morbid obesity ([place])   . OBESITY HYPOVENTILATION SYNDROME    a. on home O2.  . On home oxygen therapy   . OSA (obstructive sleep apnea)   . Permanent atrial fibrillation ([place])   . PROSTATE SPECIFIC ANTIGEN, ELEVATED 06/09/2007  . Pulmonary HTN ([place])    a. multifactorial including obstructive sleep apnea, obesity hypoventilation syndrome and possible pulmonary venous hypertension.  Marland Kitchen SKIN RASH 03/12/2010  . Unspecified hearing loss     Past Surgical History:  Procedure Laterality Date  . APPENDECTOMY  1974  . CARDIOVERSION  05/21/2008; ~ 06/2008  . HERNIA REPAIR    . INGUINAL HERNIA REPAIR Right   . RIGHT HEART CATHETERIZATION N/A 01/08/2013  Procedure: RIGHT HEART CATH;  Surgeon: Thayer Headings, MD;  Location: [facility] CATH LAB;  Service: Cardiovascular;  Laterality: N/A;  . TENDON REPAIR Right 2009   "lacerated his tendon and pulley" Dr. Lenon Curt  . UMBILICAL HERNIA REPAIR      Social History   Social History  . Marital status: Married    Spouse name: N/A  . Number of children: 2  . 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 Smoking status: Former Smoker    Packs/day: 1.00    Years: 15.00    Types: Cigarettes    Quit date: 08/09/1978  . Smokeless tobacco: Never Used      Comment: 01/05/2013 "quit smoking ~ 40 years ago"  . Alcohol use 0.0 oz/week     Comment: 01/05/2013 "hasn't had a drink since 1980's; never had problem w/it"  . Drug use: No  . Sexual activity: Not Currently   Other Topics Concern  . Not on file   Social History Narrative  . No narrative on file    Family History  Problem Relation Age of Onset  . Alzheimer's disease Father   . Prostate cancer Father   . Cancer Other     Breast Cancer, <50 yo 1st degree relative  . Coronary artery disease Other     Male, 1st degree relative    ROS: no fevers or chills, productive cough, hemoptysis, dysphasia, odynophagia, melena, hematochezia, dysuria, hematuria, rash, seizure activity, orthopnea, PND, claudication. Remaining systems are negative.  Physical Exam: Well-developed obese in no acute distress.  Skin is warm and dry. HEENT is normal.  Neck is supple.  Chest is clear to auscultation with normal expansion.  Cardiovascular exam is irregular Abdominal exam nontender or distended. No masses palpated. Extremities show Chronic skin changes and 1+ edema neuro grossly intact  ECG  A/P  1 . Permanent atrial fibrillation-continue Cardizem and metoprolol for rate control. Continue apixaban. Laboratories checked recently and showed creatinine 1.02 and hemoglobin 15.6.  2 chronic diastolic congestive heart failure-patient appears to be euvolemic. Continue present dose of Demadex. I've asked him to weigh daily and he will take an additional 20 mg of Demadex for weight gain of 2-3 pounds. Continue low-sodium diet. Continue fluid restriction. His CHF is likely multifactorial including obesity hypoventilation syndrome, sleep apnea and pulmonary venous hypertension.  3 hypertension-blood pressure controlled. Continue present medications.   4 hyperlipidemia-continue statin.  5 obesity-we discussed weight loss. He has lost 6 pounds since last office visit.  Kirk Ruths, MD

## 2016-05-03 ENCOUNTER — Ambulatory Visit (INDEPENDENT_AMBULATORY_CARE_PROVIDER_SITE_OTHER): Payer: Medicare Other | Admitting: Cardiology

## 2016-05-03 ENCOUNTER — Encounter: Payer: Self-pay | Admitting: Cardiology

## 2016-05-03 VITALS — BP 124/80 | HR 90 | Ht 68.0 in | Wt 283.4 lb

## 2016-05-03 DIAGNOSIS — I10 Essential (primary) hypertension: Secondary | ICD-10-CM

## 2016-05-03 DIAGNOSIS — I5032 Chronic diastolic (congestive) heart failure: Secondary | ICD-10-CM | POA: Diagnosis not present

## 2016-05-03 DIAGNOSIS — I482 Chronic atrial fibrillation, unspecified: Secondary | ICD-10-CM

## 2016-05-03 DIAGNOSIS — E785 Hyperlipidemia, unspecified: Secondary | ICD-10-CM | POA: Diagnosis not present

## 2016-05-03 MED ORDER — TORSEMIDE 20 MG PO TABS: 40.0000 mg | ORAL_TABLET | Freq: Two times a day (BID) | ORAL | 3 refills | Status: DC

## 2016-05-03 NOTE — Patient Instructions (Signed)
Your physician recommends that you schedule a follow-up appointment in: 3 MONTHS WITH DR CRENSHAW  

## 2016-07-13 ENCOUNTER — Other Ambulatory Visit: Payer: Self-pay | Admitting: Cardiology

## 2016-07-14 ENCOUNTER — Encounter: Payer: Self-pay | Admitting: Cardiology

## 2016-07-14 NOTE — Progress Notes (Deleted)
HPI: FU permanent AFib and right side CHF. He has failed DCCV in the past. Myoview 7/09: EF 54%, normal perfusion. Holter 09/2010: AFib with mildly decreased rate. Right heart failure felt secondary to pulmonary hypertension which is multifactorial including OSA, OHS and possible pulmonary venous hypertension. [place] 01/08/13 demonstrated elevated R heart pressures (RA 15, RV 44/11, PCWP 21, PA 41/30, CO 4.49, CI 1.8, PA sat 61%). Patient has had some degree of renal insufficiency and hyperkalemia on a combination of ACE inhibitor and spironolactone. Last echo 5/17 showed EF 55-60, biatrial enlargement, small pericardial effusion. Admitted May 2017 with worsening congestive heart failure. Diuresed with improvement. Since last seen   Current Outpatient Prescriptions  Medication Sig Dispense Refill  . allopurinol (ZYLOPRIM) 100 MG tablet Take 1 tablet (100 mg total) by mouth daily. 90 tablet 1  . atorvastatin (LIPITOR) 20 MG tablet Take 1 tablet (20 mg total) by mouth daily. 90 tablet 2  . cetirizine (ZYRTEC) 10 MG tablet Take 10 mg by mouth at bedtime.    Marland Kitchen diltiazem (CARDIZEM CD) 180 MG 24 hr capsule Take 1 capsule (180 mg total) by mouth daily. 90 capsule 3  . ELIQUIS 5 MG TABS tablet TAKE 1 TABLET TWICE A DAY 180 tablet 1  . glipiZIDE (GLIPIZIDE XL) 10 MG 24 hr tablet Take 1 tablet (10 mg total) by mouth daily with breakfast. 90 tablet 3  . KLOR-CON M20 20 MEQ tablet TAKE 1 TABLET TWICE A DAY 180 tablet 3  . metFORMIN (GLUCOPHAGE XR) 500 MG 24 hr tablet Take 2 tablets (1,000 mg total) by mouth daily with breakfast. 180 tablet 3  . metoprolol tartrate (LOPRESSOR) 25 MG tablet Take 1 tablet (25 mg total) by mouth 2 (two) times daily. 180 tablet 3  . OXYGEN Inhale 3-5 L into the lungs daily. 3L Daily  4L Resting  5L Exertion    . torsemide (DEMADEX) 20 MG tablet Take 2 tablets (40 mg total) by mouth 2 (two) times daily. 360 tablet 3   No current facility-administered medications for this visit.       Past Medical History:  Diagnosis Date  . Acute right-sided CHF (congestive heart failure)    a. 01/2013.  . Arthritis    "knees and hands; thoracic area of the spine" (01/05/2013)  . BRONCHITIS, CHRONIC 01/02/2009  . COMMON MIGRAINE    "none since treating for high BP"  . Complication of anesthesia    "aspiration pneumonia after hand OR" (01/05/2013)  . DIABETES MELLITUS, TYPE II   . GOUT   . HYPERLIPIDEMIA   . HYPERTENSION   . Long term (current) use of anticoagulants   . Morbid obesity ([place])   . OBESITY HYPOVENTILATION SYNDROME    a. on home O2.  . On home oxygen therapy   . OSA (obstructive sleep apnea)   . Permanent atrial fibrillation ([place])   . PROSTATE SPECIFIC ANTIGEN, ELEVATED 06/09/2007  . Pulmonary HTN    a. multifactorial including obstructive sleep apnea, obesity hypoventilation syndrome and possible pulmonary venous hypertension.  Marland Kitchen SKIN RASH 03/12/2010  . Unspecified hearing loss     Past Surgical History:  Procedure Laterality Date  . APPENDECTOMY  1974  . CARDIOVERSION  05/21/2008; ~ 06/2008  . HERNIA REPAIR    . INGUINAL HERNIA REPAIR Right   . RIGHT HEART CATHETERIZATION N/A 01/08/2013   Procedure: RIGHT HEART CATH;  Surgeon: Thayer Headings, MD;  Location: [facility] CATH LAB;  Service: Cardiovascular;  Laterality: N/A;  .  TENDON REPAIR Right 2009   "lacerated his tendon and pulley" Dr. Lenon Curt  . UMBILICAL HERNIA REPAIR      Social History   Social History  . Marital status: Married    Spouse name: N/A  . Number of children: 2  . 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 Smoking status: Former Smoker    Packs/day: 1.00    Years: 15.00    Types: Cigarettes    Quit date: 08/09/1978  . Smokeless tobacco: Never Used     Comment: 01/05/2013 "quit smoking ~ 40 years ago"  . Alcohol use 0.0 oz/week     Comment: 01/05/2013 "hasn't had a drink since 1980's; never had problem w/it"   . Drug use: No  . Sexual activity: Not Currently   Other Topics Concern  . Not on file   Social History Narrative  . No narrative on file    Family History  Problem Relation Age of Onset  . Alzheimer's disease Father   . Prostate cancer Father   . Cancer Other     Breast Cancer, <50 yo 1st degree relative  . Coronary artery disease Other     Male, 1st degree relative    ROS: no fevers or chills, productive cough, hemoptysis, dysphasia, odynophagia, melena, hematochezia, dysuria, hematuria, rash, seizure activity, orthopnea, PND, pedal edema, claudication. Remaining systems are negative.  Physical Exam: Well-developed well-nourished in no acute distress.  Skin is warm and dry.  HEENT is normal.  Neck is supple.  Chest is clear to auscultation with normal expansion.  Cardiovascular exam is regular rate and rhythm.  Abdominal exam nontender or distended. No masses palpated. Extremities show no edema. neuro grossly intact  ECG

## 2016-07-14 NOTE — Telephone Encounter (Signed)
Rx(s) sent to pharmacy electronically.  

## 2016-07-19 ENCOUNTER — Ambulatory Visit: Payer: Medicare Other | Admitting: Cardiology

## 2016-07-19 ENCOUNTER — Encounter: Payer: Self-pay | Admitting: *Deleted

## 2016-07-27 ENCOUNTER — Other Ambulatory Visit: Payer: Self-pay | Admitting: Internal Medicine

## 2016-10-10 ENCOUNTER — Other Ambulatory Visit: Payer: Self-pay | Admitting: Cardiology

## 2016-10-11 NOTE — Telephone Encounter (Signed)
LMOM; patient to schedule f/u with Dr [name] or provide new BMET & CBC. Will refill for 30 days.   Patient had f/u appointmnet with Dr [name] scheduled for Dec/11/17 and was a no show. No new appointment yet.

## 2016-10-13 ENCOUNTER — Ambulatory Visit (INDEPENDENT_AMBULATORY_CARE_PROVIDER_SITE_OTHER): Payer: Medicare Other | Admitting: Internal Medicine

## 2016-10-13 ENCOUNTER — Other Ambulatory Visit (INDEPENDENT_AMBULATORY_CARE_PROVIDER_SITE_OTHER): Payer: Medicare Other

## 2016-10-13 ENCOUNTER — Encounter: Payer: Self-pay | Admitting: Internal Medicine

## 2016-10-13 VITALS — BP 122/84 | HR 96 | Temp 98.3°F | Ht 68.0 in | Wt 289.0 lb

## 2016-10-13 DIAGNOSIS — E785 Hyperlipidemia, unspecified: Secondary | ICD-10-CM

## 2016-10-13 DIAGNOSIS — I10 Essential (primary) hypertension: Secondary | ICD-10-CM

## 2016-10-13 DIAGNOSIS — L989 Disorder of the skin and subcutaneous tissue, unspecified: Secondary | ICD-10-CM | POA: Diagnosis not present

## 2016-10-13 DIAGNOSIS — E0800 Diabetes mellitus due to underlying condition with hyperosmolarity without nonketotic hyperglycemic-hyperosmolar coma (NKHHC): Secondary | ICD-10-CM | POA: Diagnosis not present

## 2016-10-13 LAB — LIPID PANEL
CHOL/HDL RATIO: 3
Cholesterol: 118 mg/dL (ref 0–200)
HDL: 41.2 mg/dL (ref >39.00)
LDL CALC: 56 mg/dL (ref 0–99)
NONHDL: 76.94
Triglycerides: 107 mg/dL (ref 0.0–149.0)
VLDL: 21.4 mg/dL (ref 0.0–40.0)

## 2016-10-13 LAB — HEPATIC FUNCTION PANEL
ALK PHOS: 78 U/L (ref 39–117)
ALT: 13 U/L (ref 0–53)
AST: 12 U/L (ref 0–37)
Albumin: 3.8 g/dL (ref 3.5–5.2)
BILIRUBIN DIRECT: 0.2 mg/dL (ref 0.0–0.3)
BILIRUBIN TOTAL: 0.9 mg/dL (ref 0.2–1.2)
Total Protein: 7.5 g/dL (ref 6.0–8.3)

## 2016-10-13 LAB — HEMOGLOBIN A1C: Hgb A1c MFr Bld: 6.7 % — ABNORMAL HIGH (ref 4.6–6.5)

## 2016-10-13 LAB — BASIC METABOLIC PANEL
BUN: 14 mg/dL (ref 6–23)
CO2: 34 mEq/L — ABNORMAL HIGH (ref 19–32)
Calcium: 9.5 mg/dL (ref 8.4–10.5)
Chloride: 101 mEq/L (ref 96–112)
Creatinine, Ser: 0.93 mg/dL (ref 0.40–1.50)
GFR: 85.17 mL/min (ref >60.00)
Glucose, Bld: 129 mg/dL — ABNORMAL HIGH (ref 70–99)
POTASSIUM: 3.8 meq/L (ref 3.5–5.1)
SODIUM: 143 meq/L (ref 135–145)

## 2016-10-13 MED ORDER — ALLOPURINOL 100 MG PO TABS: 100.0000 mg | ORAL_TABLET | Freq: Every day | ORAL | 1 refills | Status: DC

## 2016-10-13 NOTE — Patient Instructions (Signed)
Please continue all other medications as before, and refills have been done if requested.  Please have the pharmacy call with any other refills you may need.  Please continue your efforts at being more active, low cholesterol diet, and weight control.  You are otherwise up to date with prevention measures today.  Please keep your appointments with your specialists as you may have planned  You will be contacted regarding the referral for: Dermatology, podiatry, and Eye doctor  Please go to the LAB in the Basement (turn left off the elevator) for the tests to be done today  You will be contacted by phone if any changes need to be made immediately.  Otherwise, you will receive a letter about your results with an explanation, but please check with MyChart first.  Please remember to sign up for MyChart if you have not done so, as this will be important to you in the future with finding out test results, communicating by private email, and scheduling acute appointments online when needed.  Please return in 6 months, or sooner if needed

## 2016-10-13 NOTE — Progress Notes (Signed)
Subjective:    Patient ID: [name] [name], male    DOB: 1945/10/16, 71 y.o.   MRN: [id number]  HPI  Here to f/u; overall doing ok,  Pt denies chest pain, increasing sob or doe, wheezing, orthopnea, PND, increased LE swelling, palpitations, dizziness or syncope.  Pt denies new neurological symptoms such as new headache, or facial or extremity weakness or numbness.  Pt denies polydipsia, polyuria, or low sugar episode.   Pt denies new neurological symptoms such as new headache, or facial or extremity weakness or numbness.   Pt states overall good compliance with meds, mostly trying to follow appropriate diet, with wt overall stable,  but little exercise however. Good compliance with CPAP at night.  Due for podiatry, optho and asks for dermatology for several back lesions and facial lesions.   Has gained several lbs with less activity, not sure if fluid wt. No other complaints.  Due for podiatry, optho yearly f/u, and requests derm referral for several actinic lesions to arms Wt Readings from Last 3 Encounters:  10/13/16 289 lb (131.1 kg)  05/03/16 283 lb 6 oz (128.5 kg)  04/13/16 286 lb (129.7 kg)   Past Medical History:  Diagnosis Date  . Acute right-sided CHF (congestive heart failure)    a. 01/2013.  . Arthritis    "knees and hands; thoracic area of the spine" (01/05/2013)  . BRONCHITIS, CHRONIC 01/02/2009  . COMMON MIGRAINE    "none since treating for high BP"  . Complication of anesthesia    "aspiration pneumonia after hand OR" (01/05/2013)  . DIABETES MELLITUS, TYPE II   . GOUT   . HYPERLIPIDEMIA   . HYPERTENSION   . Long term (current) use of anticoagulants   . Morbid obesity ([place])   . OBESITY HYPOVENTILATION SYNDROME    a. on home O2.  . On home oxygen therapy   . OSA (obstructive sleep apnea)   . Permanent atrial fibrillation ([place])   . PROSTATE SPECIFIC ANTIGEN, ELEVATED 06/09/2007  . Pulmonary HTN    a. multifactorial including obstructive sleep apnea, obesity hypoventilation  syndrome and possible pulmonary venous hypertension.  Marland Kitchen SKIN RASH 03/12/2010  . Unspecified hearing loss    Past Surgical History:  Procedure Laterality Date  . APPENDECTOMY  1974  . CARDIOVERSION  05/21/2008; ~ 06/2008  . HERNIA REPAIR    . INGUINAL HERNIA REPAIR Right   . RIGHT HEART CATHETERIZATION N/A 01/08/2013   Procedure: RIGHT HEART CATH;  Surgeon: Thayer Headings, MD;  Location: [facility] CATH LAB;  Service: Cardiovascular;  Laterality: N/A;  . TENDON REPAIR Right 2009   "lacerated his tendon and pulley" Dr. Lenon Curt  . UMBILICAL HERNIA REPAIR      reports that he quit smoking about 38 years ago. His smoking use included Cigarettes. He has a 15.00 pack-year smoking history. He has never used smokeless tobacco. He reports that he drinks alcohol. He reports that he does not use drugs. family history includes Alzheimer's disease in his father; Cancer in his other; Coronary artery disease in his other; Prostate cancer in his father. Allergies  Allergen Reactions  . Codeine     Altered mental status "goofy"  . Heparin Rash     Abdominal rash 01/2013   Current Outpatient Prescriptions on File Prior to Visit  Medication Sig Dispense Refill  . atorvastatin (LIPITOR) 20 MG tablet Take 1 tablet (20 mg total) by mouth daily. 90 tablet 2  . cetirizine (ZYRTEC) 10 MG tablet Take 10 mg by mouth  at bedtime.    Marland Kitchen glipiZIDE (GLUCOTROL XL) 10 MG 24 hr tablet TAKE 1 TABLET DAILY WITH BREAKFAST 90 tablet 3  . metFORMIN (GLUCOPHAGE XR) 500 MG 24 hr tablet Take 2 tablets (1,000 mg total) by mouth daily with breakfast. 180 tablet 3  . OXYGEN Inhale 3-5 L into the lungs daily. 3L Daily  4L Resting  5L Exertion     No current facility-administered medications on file prior to visit.    Review of Systems  Constitutional: Negative for unusual diaphoresis or night sweats HENT: Negative for ear swelling or discharge Eyes: Negative for worsening visual haziness  Respiratory: Negative for choking and stridor.     Gastrointestinal: Negative for distension or worsening eructation Genitourinary: Negative for retention or change in urine volume.  Musculoskeletal: Negative for other MSK pain or swelling Skin: Negative for color change and worsening wound Neurological: Negative for tremors and numbness other than noted  Psychiatric/Behavioral: Negative for decreased concentration or agitation other than above       Objective:   Physical Exam /BP 122/84   Pulse 96   Temp 98.3 F (36.8 C)   Ht 5\' 8"  (1.727 m)   Wt 289 lb (131.1 kg)   SpO2 97%   BMI 43.94 kg/m  - on home o2 4L continuous VS noted, not ill appearing, morbid obese Constitutional: Pt appears in no apparent distress HENT: Head: NCAT.  Right Ear: External ear normal.  Left Ear: External ear normal.  Eyes: . Pupils are equal, round, and reactive to light. Conjunctivae and EOM are normal Neck: Normal range of motion. Neck supple.  Cardiovascular: Normal rate and regular rhythm.   Pulmonary/Chest: Effort normal and breath sounds without rales or wheezing.  Abd:  Soft, NT, ND, + BS Neurological: Pt is alert. Not confused , motor grossly intact Skin: Skin is warm. No rash, no LE edema Psychiatric: Pt behavior is normal. No agitation.  No other exam findings  Transthoracic Echocardiography Study Date: 12/24/2015 ------------------------------------------------------------------- LV EF: 55% -   60% Study Conclusions  - Procedure narrative: Transthoracic echocardiography. Image   quality was suboptimal, with poor valvular and endocardial   visualization. - Left ventricle: The cavity size was normal. Systolic function was   normal. The estimated ejection fraction was in the range of 55%   to 60%. Images were inadequate for LV wall motion assessment. The   study was not technically sufficient to allow evaluation of LV   diastolic dysfunction due to atrial fibrillation. - Left atrium: The atrium was severely dilated. - Right  atrium: The atrium was mildly dilated. - Pericardium, extracardiac: Small posterior pericardial effusion.    Assessment & Plan:

## 2016-10-15 ENCOUNTER — Encounter: Payer: Self-pay | Admitting: Student

## 2016-10-15 ENCOUNTER — Ambulatory Visit (INDEPENDENT_AMBULATORY_CARE_PROVIDER_SITE_OTHER): Payer: Medicare Other | Admitting: Student

## 2016-10-15 VITALS — BP 121/79 | HR 74 | Ht 68.0 in | Wt 283.0 lb

## 2016-10-15 DIAGNOSIS — Z7901 Long term (current) use of anticoagulants: Secondary | ICD-10-CM

## 2016-10-15 DIAGNOSIS — E662 Morbid (severe) obesity with alveolar hypoventilation: Secondary | ICD-10-CM

## 2016-10-15 DIAGNOSIS — I5032 Chronic diastolic (congestive) heart failure: Secondary | ICD-10-CM | POA: Diagnosis not present

## 2016-10-15 DIAGNOSIS — Z9989 Dependence on other enabling machines and devices: Secondary | ICD-10-CM

## 2016-10-15 DIAGNOSIS — G4733 Obstructive sleep apnea (adult) (pediatric): Secondary | ICD-10-CM | POA: Diagnosis not present

## 2016-10-15 DIAGNOSIS — I482 Chronic atrial fibrillation, unspecified: Secondary | ICD-10-CM

## 2016-10-15 DIAGNOSIS — I10 Essential (primary) hypertension: Secondary | ICD-10-CM

## 2016-10-15 DIAGNOSIS — E785 Hyperlipidemia, unspecified: Secondary | ICD-10-CM

## 2016-10-15 MED ORDER — POTASSIUM CHLORIDE CRYS ER 20 MEQ PO TBCR: 20.0000 meq | EXTENDED_RELEASE_TABLET | Freq: Two times a day (BID) | ORAL | 3 refills | Status: DC

## 2016-10-15 MED ORDER — TORSEMIDE 20 MG PO TABS: 40.0000 mg | ORAL_TABLET | Freq: Two times a day (BID) | ORAL | 2 refills | Status: DC

## 2016-10-15 MED ORDER — APIXABAN 5 MG PO TABS
5.0000 mg | ORAL_TABLET | Freq: Two times a day (BID) | ORAL | 3 refills | Status: DC
Start: 2016-10-15 — End: 2017-11-05

## 2016-10-15 MED ORDER — DILTIAZEM HCL ER COATED BEADS 180 MG PO CP24: 180.0000 mg | ORAL_CAPSULE | Freq: Every day | ORAL | 3 refills | Status: DC

## 2016-10-15 MED ORDER — METOPROLOL TARTRATE 25 MG PO TABS
25.0000 mg | ORAL_TABLET | Freq: Two times a day (BID) | ORAL | 3 refills | Status: DC
Start: 2016-10-15 — End: 2017-10-18

## 2016-10-15 NOTE — Patient Instructions (Signed)
Medication Instructions:  Your physician recommends that you continue on your current medications as directed. Please refer to the Current Medication list given to you today.   Labwork: None ordered  Testing/Procedures: None ordered  Follow-Up: Your physician wants you to follow-up in: 6 MONTHS WITH DR. CRENSHAW  You will receive a reminder letter in the mail two months in advance. If you don't receive a letter, please call our office to schedule the follow-up appointment.     Any Other Special Instructions Will Be Listed Below (If Applicable).     If you need a refill on your cardiac medications before your next appointment, please call your pharmacy.   

## 2016-10-15 NOTE — Progress Notes (Signed)
Cardiology Office Note    Date:  10/15/2016   ID:  CLARON ROSENCRANS, DOB [birth year], MRN [id number]  PCP:  Cathlean Cower, MD  Cardiologist: Dr. [name]   Chief Complaint  Patient presents with  . Follow-up    History of Present Illness:    C[place]don MYKAI WENDORF is a 71 y.o. male with past medical history of permanent atrial fibrillation (on Eliquis), OSA (on CPAP), chronic diastolic CHF, HLD, obesity hypoventilation syndrome (on 4L [place]), Type 2 DM and possible elevated right heart pressures by cath in 01/2013 who presents to the office today for routine follow-up.  Was last seen by Dr. [name] in 04/2016 and reported doing well at that time, denying any orthopnea, PND, chest discomfort, or palpitations. Does have chronic lower extremity edema. He was continued on Cardizem and Lopressor for rate control and Eliquis for anticoagulation. Was continued on Torsemide 40mg  BID.   In talking with the patient today, he reports doing well from a cardiac perspective over the past 6 months. He is on 4L nasal cannula chronically and uses CPAP at night. He denies any recent chest discomfort, palpitations, lightheadedness, dizziness, or presyncope.   Does have baseline dyspnea with exertion along with chronic lower extremity edema. His weight has gone down 6 pounds in the past 2 days due to limiting his fluids. He was consuming approximately 3L of fluid per day and was instructed to reduce this to 2L at the time of his recent PCP visit. He is attempting to consume more water as compared to soda.  He remains on Torsemide 40 mg twice daily. He reports good compliance with this along with good urinary output. No evidence of active bleeding with being on Eliquis.    Past Medical History:  Diagnosis Date  . Acute right-sided CHF (congestive heart failure)    a. 01/2013.  . Arthritis    "knees and hands; thoracic area of the spine" (01/05/2013)  . BRONCHITIS, CHRONIC 01/02/2009  . COMMON MIGRAINE    "none since treating for  high BP"  . Complication of anesthesia    "aspiration pneumonia after hand OR" (01/05/2013)  . DIABETES MELLITUS, TYPE II   . GOUT   . HYPERLIPIDEMIA   . HYPERTENSION   . Long term (current) use of anticoagulants   . Morbid obesity ([place])   . OBESITY HYPOVENTILATION SYNDROME    a. on home O2.  . On home oxygen therapy   . OSA (obstructive sleep apnea)   . Permanent atrial fibrillation ([place])   . PROSTATE SPECIFIC ANTIGEN, ELEVATED 06/09/2007  . Pulmonary HTN    a. multifactorial including obstructive sleep apnea, obesity hypoventilation syndrome and possible pulmonary venous hypertension.  Marland Kitchen SKIN RASH 03/12/2010  . Unspecified hearing loss     Past Surgical History:  Procedure Laterality Date  . APPENDECTOMY  1974  . CARDIOVERSION  05/21/2008; ~ 06/2008  . HERNIA REPAIR    . INGUINAL HERNIA REPAIR Right   . RIGHT HEART CATHETERIZATION N/A 01/08/2013   Procedure: RIGHT HEART CATH;  Surgeon: Thayer Headings, MD;  Location: [facility] CATH LAB;  Service: Cardiovascular;  Laterality: N/A;  . TENDON REPAIR Right 2009   "lacerated his tendon and pulley" Dr. Lenon Curt  . UMBILICAL HERNIA REPAIR      Current Medications: Outpatient Medications Prior to Visit  Medication Sig Dispense Refill  . allopurinol (ZYLOPRIM) 100 MG tablet Take 1 tablet (100 mg total) by mouth daily. 90 tablet 1  . atorvastatin (LIPITOR) 20 MG tablet Take  1 tablet (20 mg total) by mouth daily. 90 tablet 2  . cetirizine (ZYRTEC) 10 MG tablet Take 10 mg by mouth at bedtime.    Marland Kitchen glipiZIDE (GLUCOTROL XL) 10 MG 24 hr tablet TAKE 1 TABLET DAILY WITH BREAKFAST 90 tablet 3  . metFORMIN (GLUCOPHAGE XR) 500 MG 24 hr tablet Take 2 tablets (1,000 mg total) by mouth daily with breakfast. 180 tablet 3  . OXYGEN Inhale 3-5 L into the lungs daily. 3L Daily  4L Resting  5L Exertion    . diltiazem (CARDIZEM CD) 180 MG 24 hr capsule Take 1 capsule (180 mg total) by mouth daily. 90 capsule 3  . ELIQUIS 5 MG TABS tablet TAKE 1 TABLET TWICE A  DAY 60 tablet 0  . KLOR-CON M20 20 MEQ tablet TAKE 1 TABLET TWICE A DAY 180 tablet 3  . metoprolol tartrate (LOPRESSOR) 25 MG tablet Take 1 tablet (25 mg total) by mouth 2 (two) times daily. 180 tablet 3  . torsemide (DEMADEX) 20 MG tablet Take 2 tablets (40 mg total) by mouth 2 (two) times daily. 360 tablet 2   No facility-administered medications prior to visit.      Allergies:   Codeine and Heparin   Social History   Social History  . Marital status: Married    Spouse name: N/A  . Number of children: 2  . 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 Smoking status: Former Smoker    Packs/day: 1.00    Years: 15.00    Types: Cigarettes    Quit date: 08/09/1978  . Smokeless tobacco: Never Used     Comment: 01/05/2013 "quit smoking ~ 40 years ago"  . Alcohol use 0.0 oz/week     Comment: 01/05/2013 "hasn't had a drink since 1980's; never had problem w/it"  . Drug use: No  . Sexual activity: Not Currently   Other Topics Concern  . None   Social History Narrative  . None     Family History:  The patient's family history includes Alzheimer's disease in his father; Cancer in his other; Coronary artery disease in his other; Prostate cancer in his father.   Review of Systems:   Please see the history of present illness.     General:  No chills, fever, night sweats or weight changes.  Cardiovascular:  No chest pain, edema, orthopnea, palpitations, paroxysmal nocturnal dyspnea. Positive for baseline dyspnea on exertion.  Dermatological: No rash, lesions/masses Respiratory: No cough, dyspnea Urologic: No hematuria, dysuria Abdominal:   No nausea, vomiting, diarrhea, bright red blood per rectum, melena, or hematemesis Neurologic:  No visual changes, wkns, changes in mental status. All other systems reviewed and are otherwise negative except as noted above.   Physical Exam:    VS:  BP 121/79   Pulse  74   Ht 5\' 8"  (1.727 m)   Wt 283 lb (128.4 kg)   BMI 43.03 kg/m    General: Well developed, obese Caucasian male appearing in no acute distress. Head: Normocephalic, atraumatic, sclera non-icteric, no xanthomas, nares are without discharge.  Neck: No carotid bruits. JVD not elevated.  Lungs: Respirations regular and unlabored, without wheezes or rales.  Heart: Irregularly irregular. No S3 or S4.  No murmur, no rubs, or gallops appreciated. Abdomen: Soft, non-tender, non-distended with normoactive bowel sounds. No hepatomegaly. No rebound/guarding. No obvious abdominal masses. Msk:  Strength and tone appear normal for age. No joint  deformities or effusions. Extremities: No clubbing or cyanosis. Chronic lower extremity edema.  Distal pedal pulses are 2+ bilaterally. Neuro: Alert and oriented X 3. Moves all extremities spontaneously. No focal deficits noted. Psych:  Responds to questions appropriately with a normal affect. Skin: No rashes or lesions noted  Wt Readings from Last 3 Encounters:  10/15/16 283 lb (128.4 kg)  10/13/16 289 lb (131.1 kg)  05/03/16 283 lb 6 oz (128.5 kg)     Studies/Labs Reviewed:   EKG:  EKG is ordered today. The ekg ordered today demonstrates atrial fibrillation, HR 74, with no acute ST or T-wave changes.   Recent Labs: 12/23/2015: B Natriuretic Peptide 197.0; Magnesium 1.8 04/13/2016: Hemoglobin 15.6; Platelets 253.0; TSH 1.24 10/13/2016: ALT 13; BUN 14; Creatinine, Ser 0.93; Potassium 3.8; Sodium 143   Lipid Panel    Component Value Date/Time   CHOL 118 10/13/2016 1001   TRIG 107.0 10/13/2016 1001   HDL 41.20 10/13/2016 1001   CHOLHDL 3 10/13/2016 1001   VLDL 21.4 10/13/2016 1001   LDLCALC 56 10/13/2016 1001    Additional studies/ records that were reviewed today include:   Echocardiogram: 12/2015 Study Conclusions  - Procedure narrative: Transthoracic echocardiography. Image   quality was suboptimal, with poor valvular and endocardial    visualization. - Left ventricle: The cavity size was normal. Systolic function was   normal. The estimated ejection fraction was in the range of 55%   to 60%. Images were inadequate for LV wall motion assessment. The   study was not technically sufficient to allow evaluation of LV   diastolic dysfunction due to atrial fibrillation. - Left atrium: The atrium was severely dilated. - Right atrium: The atrium was mildly dilated. - Pericardium, extracardiac: Small posterior pericardial effusion.  Assessment:    1. Chronic atrial fibrillation ([place])   2. Chronic anticoagulation   3. Chronic diastolic CHF (congestive heart failure) ([place])   4. Essential hypertension   5. Obesity hypoventilation syndrome ([place])   6. OSA on CPAP   7. Hyperlipidemia, unspecified hyperlipidemia type      Plan:   In order of problems listed above:  1. Chronic Atrial Fibrillation/ Chronic Anticoagulation - This patients CHA2DS2-VASc Score and unadjusted Ischemic Stroke Rate (% per year) is equal to 3.2 % stroke rate/year from a score of 3 (HTN, DM, Age). He denies any evidence of active bleeding. Continue Eliquis 5mg  BID. - HR well-controlled on examination. He denies any recent palpitations.  - continue Cardizem CD 180mg  daily and Lopressor 25mg  BID for rate-control.   2. Chronic Diastolic CHF - echo in 78/2956 showed a preserved EF of 55-60%.  - he has baseline dyspnea with exertion along with chronic lower extremity edema. He is actively trying to consume less fluids and was encouraged to consume less than 2L per day. Limits sodium intake.  - BMET on 3/7 showed stable K+ of 3.8 with creatinine at 0.93. Continue Torsemide 40 mg BID.  3. Essential Hypertension - BP well-controlled at 121/79 during today's visit.  - continue Cardizem CD 180mg  daily and Lopressor 25mg  BID.   4. Obesity Hypoventilation Syndrome/OSA - remains on 4L [place] during the daytime, uses CPAP at night.   5. HLD - Lipid Panel from  10/13/2016 shows total cholesterol 118, HDL 41, and LDL 56. - continue Atorvastatin 20mg  daily.    Medication Adjustments/Labs and Tests Ordered: Current medicines are reviewed at length with the patient today.  Concerns regarding medicines are outlined above.  Medication changes, Labs and Tests ordered  today are listed in the Patient Instructions below. Patient Instructions  Medication Instructions:  Your physician recommends that you continue on your current medications as directed. Please refer to the Current Medication list given to you today.  Labwork: None ordered  Testing/Procedures: None ordered  Follow-Up: Your physician wants you to follow-up in: [place] DR. CRENSHAW  You will receive a reminder letter in the mail two months in advance. If you don't receive a letter, please call our office to schedule the follow-up appointment.  Any Other Special Instructions Will Be Listed Below (If Applicable).  If you need a refill on your cardiac medications before your next appointment, please call your pharmacy.   Arna Medici, [place]  10/15/2016 8:53 PM    [facility] [place], [place], [place]  [address] Phone: [phone]; Fax: [phone]  [address], [place],  [address] Phone: [phone]

## 2016-10-16 NOTE — Assessment & Plan Note (Signed)
stable overall by history and exam, recent data reviewed with pt, and pt to continue medical treatment as before,  to f/u any worsening symptoms or concerns BP Readings from Last 3 Encounters:  10/15/16 121/79  10/13/16 122/84  05/03/16 124/80

## 2016-10-16 NOTE — Assessment & Plan Note (Signed)
stable overall by history and exam, recent data reviewed with pt, and pt to continue medical treatment as before,  to f/u any worsening symptoms or concerns Lab Results  Component Value Date   HGBA1C 6.7 (H) 10/13/2016

## 2016-10-16 NOTE — Assessment & Plan Note (Signed)
stable overall by history and exam, recent data reviewed with pt, and pt to continue medical treatment as before,  to f/u any worsening symptoms or concerns Lab Results  Component Value Date   LDLCALC 56 10/13/2016

## 2016-10-26 DIAGNOSIS — H25813 Combined forms of age-related cataract, bilateral: Secondary | ICD-10-CM | POA: Diagnosis not present

## 2016-10-26 DIAGNOSIS — E119 Type 2 diabetes mellitus without complications: Secondary | ICD-10-CM | POA: Diagnosis not present

## 2016-11-04 ENCOUNTER — Telehealth: Payer: Self-pay | Admitting: Internal Medicine

## 2016-11-04 NOTE — Telephone Encounter (Signed)
Called patient to schedule awv. Lvm for patient to call office to schedule appt.  °

## 2016-11-24 ENCOUNTER — Encounter: Payer: Self-pay | Admitting: Internal Medicine

## 2016-11-30 DIAGNOSIS — L570 Actinic keratosis: Secondary | ICD-10-CM | POA: Diagnosis not present

## 2016-11-30 DIAGNOSIS — D1800 Hemangioma unspecified site: Secondary | ICD-10-CM | POA: Diagnosis not present

## 2016-11-30 DIAGNOSIS — L918 Other hypertrophic disorders of the skin: Secondary | ICD-10-CM | POA: Diagnosis not present

## 2016-11-30 DIAGNOSIS — L723 Sebaceous cyst: Secondary | ICD-10-CM | POA: Diagnosis not present

## 2016-11-30 DIAGNOSIS — L219 Seborrheic dermatitis, unspecified: Secondary | ICD-10-CM | POA: Diagnosis not present

## 2016-11-30 DIAGNOSIS — L821 Other seborrheic keratosis: Secondary | ICD-10-CM | POA: Diagnosis not present

## 2016-11-30 DIAGNOSIS — D225 Melanocytic nevi of trunk: Secondary | ICD-10-CM | POA: Diagnosis not present

## 2017-01-02 ENCOUNTER — Other Ambulatory Visit: Payer: Self-pay | Admitting: Cardiology

## 2017-02-15 ENCOUNTER — Ambulatory Visit (INDEPENDENT_AMBULATORY_CARE_PROVIDER_SITE_OTHER): Payer: Medicare Other | Admitting: Internal Medicine

## 2017-02-15 ENCOUNTER — Encounter: Payer: Self-pay | Admitting: Internal Medicine

## 2017-02-15 VITALS — BP 150/90 | HR 65 | Ht 68.0 in | Wt 285.0 lb

## 2017-02-15 DIAGNOSIS — E0800 Diabetes mellitus due to underlying condition with hyperosmolarity without nonketotic hyperglycemic-hyperosmolar coma (NKHHC): Secondary | ICD-10-CM | POA: Diagnosis not present

## 2017-02-15 DIAGNOSIS — M25561 Pain in right knee: Secondary | ICD-10-CM

## 2017-02-15 DIAGNOSIS — I10 Essential (primary) hypertension: Secondary | ICD-10-CM | POA: Diagnosis not present

## 2017-02-15 MED ORDER — TRAMADOL HCL 50 MG PO TABS: 50.0000 mg | ORAL_TABLET | Freq: Four times a day (QID) | ORAL | 2 refills | Status: DC | PRN

## 2017-02-15 NOTE — Patient Instructions (Signed)
Please take all new medication as prescribed - the tramadol for pain  Please continue all other medications as before, and refills have been done if requested.  Please have the pharmacy call with any other refills you may need.  Please keep your appointments with your specialists as you may have planned  You will be contacted regarding the referral for: MRI for the right knee, and Dr Tamala Julian - sports medicine (or you can make an appt as you leave today)

## 2017-02-15 NOTE — Progress Notes (Signed)
Subjective:    Patient ID: [name] [name], male    DOB: 1946-07-22, 71 y.o.   MRN: [id number]  HPI  Here to f/u with acute onset 10 days sharp mod to severe constant right knee anterolat pain with radation to hip, assoc with right knee swelling, weakness, tendency to want to giveaway but no falls, without fever, trauma or other joint symptoms suggestive of gout.  Worse to stand and walk, better to sit  Nothing else seems to make better or worse except worse to climbing stairs, has to lead with the left leg. Pt denies chest pain, increased sob or doe, wheezing, orthopnea, PND, increased LE swelling, palpitations, dizziness or syncope.  Pt denies new neurological symptoms such as new headache, or facial or extremity weakness or numbness   Pt denies polydipsia, polyuria  Past Medical History:  Diagnosis Date  . Acute right-sided CHF (congestive heart failure) ([place])    a. 01/2013.  . Arthritis    "knees and hands; thoracic area of the spine" (01/05/2013)  . BRONCHITIS, CHRONIC 01/02/2009  . COMMON MIGRAINE    "none since treating for high BP"  . Complication of anesthesia    "aspiration pneumonia after hand OR" (01/05/2013)  . DIABETES MELLITUS, TYPE II   . GOUT   . HYPERLIPIDEMIA   . HYPERTENSION   . Long term (current) use of anticoagulants   . Morbid obesity ([place])   . OBESITY HYPOVENTILATION SYNDROME    a. on home O2.  . On home oxygen therapy   . OSA (obstructive sleep apnea)   . Permanent atrial fibrillation ([place])   . PROSTATE SPECIFIC ANTIGEN, ELEVATED 06/09/2007  . Pulmonary HTN ([place])    a. multifactorial including obstructive sleep apnea, obesity hypoventilation syndrome and possible pulmonary venous hypertension.  Marland Kitchen SKIN RASH 03/12/2010  . Unspecified hearing loss    Past Surgical History:  Procedure Laterality Date  . APPENDECTOMY  1974  . CARDIOVERSION  05/21/2008; ~ 06/2008  . HERNIA REPAIR    . INGUINAL HERNIA REPAIR Right   . RIGHT HEART CATHETERIZATION N/A 01/08/2013   Procedure: RIGHT HEART CATH;  Surgeon: Thayer Headings, MD;  Location: [facility] CATH LAB;  Service: Cardiovascular;  Laterality: N/A;  . TENDON REPAIR Right 2009   "lacerated his tendon and pulley" Dr. Lenon Curt  . UMBILICAL HERNIA REPAIR      reports that he quit smoking about 38 years ago. His smoking use included Cigarettes. He has a 15.00 pack-year smoking history. He has never used smokeless tobacco. He reports that he drinks alcohol. He reports that he does not use drugs. family history includes Alzheimer's disease in his father; Cancer in his other; Coronary artery disease in his other; Prostate cancer in his father. Allergies  Allergen Reactions  . Codeine     Altered mental status "goofy"  . Heparin Rash     Abdominal rash 01/2013   Current Outpatient Prescriptions on File Prior to Visit  Medication Sig Dispense Refill  . allopurinol (ZYLOPRIM) 100 MG tablet Take 1 tablet (100 mg total) by mouth daily. 90 tablet 1  . apixaban (ELIQUIS) 5 MG TABS tablet Take 1 tablet (5 mg total) by mouth 2 (two) times daily. 180 tablet 3  . atorvastatin (LIPITOR) 20 MG tablet TAKE 1 TABLET DAILY 90 tablet 2  . cetirizine (ZYRTEC) 10 MG tablet Take 10 mg by mouth at bedtime.    Marland Kitchen diltiazem (CARDIZEM CD) 180 MG 24 hr capsule Take 1 capsule (180 mg total) by mouth  daily. 90 capsule 3  . glipiZIDE (GLUCOTROL XL) 10 MG 24 hr tablet TAKE 1 TABLET DAILY WITH BREAKFAST 90 tablet 3  . metFORMIN (GLUCOPHAGE XR) 500 MG 24 hr tablet Take 2 tablets (1,000 mg total) by mouth daily with breakfast. 180 tablet 3  . metoprolol tartrate (LOPRESSOR) 25 MG tablet Take 1 tablet (25 mg total) by mouth 2 (two) times daily. 180 tablet 3  . OXYGEN Inhale 3-5 L into the lungs daily. 3L Daily  4L Resting  5L Exertion    . potassium chloride SA (KLOR-CON M20) 20 MEQ tablet Take 1 tablet (20 mEq total) by mouth 2 (two) times daily. 180 tablet 3  . torsemide (DEMADEX) 20 MG tablet Take 2 tablets (40 mg total) by mouth 2 (two) times  daily. 360 tablet 2   No current facility-administered medications on file prior to visit.    Review of Systems  Constitutional: Negative for other unusual diaphoresis or sweats HENT: Negative for ear discharge or swelling Eyes: Negative for other worsening visual disturbances Respiratory: Negative for stridor or other swelling  Gastrointestinal: Negative for worsening distension or other blood Genitourinary: Negative for retention or other urinary change Musculoskeletal: Negative for other MSK pain or swelling Skin: Negative for color change or other new lesions Neurological: Negative for worsening tremors and other numbness  Psychiatric/Behavioral: Negative for worsening agitation or other fatigue All other system neg per pt    Objective:   Physical Exam BP (!) 150/90   Pulse 65   Ht 5\' 8"  (1.727 m)   Wt 285 lb (129.3 kg)   SpO2 98%   BMI 43.33 kg/m  VS noted,  Constitutional: Pt appears in NAD HENT: Head: NCAT.  Right Ear: External ear normal.  Left Ear: External ear normal.  Eyes: . Pupils are equal, round, and reactive to light. Conjunctivae and EOM are normal Nose: without d/c or deformity Neck: Neck supple. Gross normal ROM Cardiovascular: Normal rate and regular rhythm.   Pulmonary/Chest: Effort normal and breath sounds without rales or wheezing.  Right knee with 2+ effusion, reduced ROM, tender to right joint line Neurological: Pt is alert. At baseline orientation, motor grossly intact Skin: Skin is warm. No rashes, other new lesions, trace bilat LE edema Psychiatric: Pt behavior is normal without agitation  No other exam findings    Assessment & Plan:

## 2017-02-17 NOTE — Assessment & Plan Note (Signed)
Acute onset without improvement, if anything getting worse with pain, swelling and wanting to giveaway, walking with cane; for tramadol prn, MRI right knee, and refer sport med in this office for further eval and tx

## 2017-02-17 NOTE — Assessment & Plan Note (Signed)
Mild elevated, likely situational, o/w stable overall by history and exam, recent data reviewed with pt, and pt to continue medical treatment as before,  to f/u any worsening symptoms or concerns BP Readings from Last 3 Encounters:  02/15/17 (!) 150/90  10/15/16 121/79  10/13/16 122/84

## 2017-02-17 NOTE — Assessment & Plan Note (Signed)
stable overall by history and exam, recent data reviewed with pt, and pt to continue medical treatment as before,  to f/u any worsening symptoms or concerns Lab Results  Component Value Date   HGBA1C 6.7 (H) 10/13/2016

## 2017-02-19 NOTE — Progress Notes (Signed)
[facility] [address] [place], [place] [address] Phone: [phone] Subjective:    I'm seeing this patient by the request  of:  [name], MD   HA:LPFX pain right   TKW:[id number]  [name] [name] is a 71 y.o. male coming in with complaint of right-sided knee pain. Patient states that this is been going on now for 2 weeks. States that there is a severe constant right knee pain on the anterior lateral aspect of the knee. Sometimes as radiation going towards the hip. Positive for swelling. Sometimes feels like he is having instability but has not fallen. Patient states that it seems to be worse with movement. Remember any true injury.    Past Medical History:  Diagnosis Date  . Acute right-sided CHF (congestive heart failure) ([place])    a. 01/2013.  . Arthritis    "knees and hands; thoracic area of the spine" (01/05/2013)  . BRONCHITIS, CHRONIC 01/02/2009  . COMMON MIGRAINE    "none since treating for high BP"  . Complication of anesthesia    "aspiration pneumonia after hand OR" (01/05/2013)  . DIABETES MELLITUS, TYPE II   . GOUT   . HYPERLIPIDEMIA   . HYPERTENSION   . Long term (current) use of anticoagulants   . Morbid obesity ([place])   . OBESITY HYPOVENTILATION SYNDROME    a. on home O2.  . On home oxygen therapy   . OSA (obstructive sleep apnea)   . Permanent atrial fibrillation ([place])   . PROSTATE SPECIFIC ANTIGEN, ELEVATED 06/09/2007  . Pulmonary HTN ([place])    a. multifactorial including obstructive sleep apnea, obesity hypoventilation syndrome and possible pulmonary venous hypertension.  Marland Kitchen SKIN RASH 03/12/2010  . Unspecified hearing loss    Past Surgical History:  Procedure Laterality Date  . APPENDECTOMY  1974  . CARDIOVERSION  05/21/2008; ~ 06/2008  . HERNIA REPAIR    . INGUINAL HERNIA REPAIR Right   . RIGHT HEART CATHETERIZATION N/A 01/08/2013   Procedure: RIGHT HEART CATH;  Surgeon: Thayer Headings, MD;  Location: [facility] CATH LAB;  Service:  Cardiovascular;  Laterality: N/A;  . TENDON REPAIR Right 2009   "lacerated his tendon and pulley" Dr. Lenon Curt  . UMBILICAL HERNIA REPAIR     Social History   Social History  . Marital status: Married    Spouse name: N/A  . Number of children: 2  . 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 Smoking status: Former Smoker    Packs/day: 1.00    Years: 15.00    Types: Cigarettes    Quit date: 08/09/1978  . Smokeless tobacco: Never Used     Comment: 01/05/2013 "quit smoking ~ 40 years ago"  . Alcohol use 0.0 oz/week     Comment: 01/05/2013 "hasn't had a drink since 1980's; never had problem w/it"  . Drug use: No  . Sexual activity: Not Currently   Other Topics Concern  . None   Social History Narrative  . None   Allergies  Allergen Reactions  . Codeine     Altered mental status "goofy"  . Heparin Rash     Abdominal rash 01/2013   Family History  Problem Relation Age of Onset  . Alzheimer's disease Father   . Prostate cancer Father   . Cancer Other        Breast Cancer, <50 yo 1st degree relative  .  Coronary artery disease Other        Male, 1st degree relative    Past medical history, social, surgical and family history all reviewed in electronic medical record.  No pertanent information unless stated regarding to the chief complaint.   Review of Systems:Review of systems updated and as accurate as of 02/21/17  No headache, visual changes, nausea, vomiting, diarrhea, constipation, dizziness, abdominal pain, skin rash, fevers, chills, night sweats, weight loss, swollen lymph nodes,joint swelling, chest pain, mood changes.  Positive muscle aches, positive shortness of breath but patient is on oxygen,possiblebody aches  Objective  Blood pressure 112/70, pulse 95, height 5\' 8"  (1.727 m), weight 285 lb (129.3 kg), SpO2 (!) 83 %. Systems examined below as of 02/21/17   General: No apparent distress  alert and oriented x3 mood and affect normal, dressed appropriately. Severe arthritic changes of multiple jointsminorly short breath at baseline HEENT: Pupils equal, extraocular movements intact  Respiratory: Patient's unable to speak in full sentences and does not appear short of breath  Cardiovascular: No lower extremity edema, non tender, no erythema  Skin: Warm dry intact with no signs of infection or rash on extremities or on axial skeleton.  Abdomen: Soft nontender  Neuro: Cranial nerves II through XII are intact, neurovascularly intact in all extremities with 2+ DTRs and 2+ pulses.  Lymph: No lymphadenopathy of posterior or anterior cervical chain or axillae bilaterally.  Gait Antalgic gait  MSK:  Non tender with full range of motion and good stability and symmetric strength and tone of shoulders, elbows, wrist, hip, and ankles bilaterally. Severe arthritis. Knee:right valgus deformity noted. Large thigh to calf ratio.  Tender to palpation over medial and PF joint line.  ROM full in flexion and extension and lower leg rotation. instability with valgus force.  painful patellar compression. Patellar glide with moderate crepitus. Patellar and quadriceps tendons unremarkable. Hamstring and quadriceps strength is normal. Contralateral knee shows   Mild arthritic changes of the contralateral  Procedure note 97110; 15 minutes spent for Therapeutic exercises as stated in above notes.  This included exercises focusing on stretching, strengthening, with significant focus on eccentric aspects.   Given rehab exercises handout for VMO, hip abductors, core, entire kinetic chain including proprioception exercises including cone touches, step downs, hip elevations and turn outs.  Could benefit from PT, regular exercise, upright biking, and a PFS knee brace to assist with tracking abnormalities.  Proper technique shown and discussed handout in great detail with ATC.  All questions were discussed and  answered.      Impression and Recommendations:     This case required medical decision making of moderate complexity.      Note: This dictation was prepared with Dragon dictation along with smaller phrase technology. Any transcriptional errors that result from this process are unintentional.

## 2017-02-21 ENCOUNTER — Ambulatory Visit (INDEPENDENT_AMBULATORY_CARE_PROVIDER_SITE_OTHER): Payer: Medicare Other | Admitting: Family Medicine

## 2017-02-21 ENCOUNTER — Encounter: Payer: Self-pay | Admitting: Family Medicine

## 2017-02-21 DIAGNOSIS — M1711 Unilateral primary osteoarthritis, right knee: Secondary | ICD-10-CM | POA: Diagnosis not present

## 2017-02-21 MED ORDER — VITAMIN D (ERGOCALCIFEROL) 1.25 MG (50000 UNIT) PO CAPS: 50000.0000 [IU] | ORAL_CAPSULE | ORAL | 0 refills | Status: DC

## 2017-02-21 NOTE — Assessment & Plan Note (Signed)
Patient elected do conservative therapy. We discussed icing regimen and home exercises. We discussed which activities doing which ones to avoid. Patient will slowly increase activity.Topical anti-inflammatories and we discussed the over-the-counter medications and greater detail. Work with Product/process development scientist to learn home exercises. Patient will follow-up with me again in 4-6 weeks

## 2017-02-21 NOTE — Patient Instructions (Signed)
Good to see you  [name] [name] is your friend. Ice 20 minutes 2 times daily. Usually after activity and before bed. Exercises 3 times a week.  pennsaid pinkie amount topically 2 times daily as needed.  Once weekly vitamin D for 12 weeks.  Over the counter Tart cherry extract any dose at night.  Take 1 tylenol with the tramadol  See me again in 4 weeks.

## 2017-02-21 NOTE — Progress Notes (Signed)
Pre visit review using our clinic review tool, if applicable. No additional management support is needed unless otherwise documented below in the visit note. 

## 2017-02-26 ENCOUNTER — Ambulatory Visit
Admission: RE | Admit: 2017-02-26 | Discharge: 2017-02-26 | Disposition: A | Discharge status: Home / Self Care | Payer: Medicare Other | Source: Ambulatory Visit | Attending: Internal Medicine | Admitting: Internal Medicine

## 2017-02-26 ENCOUNTER — Other Ambulatory Visit: Payer: Self-pay | Admitting: Internal Medicine

## 2017-02-26 DIAGNOSIS — M25561 Pain in right knee: Secondary | ICD-10-CM

## 2017-02-26 DIAGNOSIS — M795 Residual foreign body in soft tissue: Secondary | ICD-10-CM

## 2017-02-27 ENCOUNTER — Other Ambulatory Visit: Payer: Medicare Other

## 2017-03-02 ENCOUNTER — Ambulatory Visit
Admission: RE | Admit: 2017-03-02 | Discharge: 2017-03-02 | Disposition: A | Discharge status: Home / Self Care | Payer: Medicare Other | Source: Ambulatory Visit | Attending: Internal Medicine | Admitting: Internal Medicine

## 2017-03-02 DIAGNOSIS — M25561 Pain in right knee: Secondary | ICD-10-CM | POA: Diagnosis not present

## 2017-03-02 DIAGNOSIS — M795 Residual foreign body in soft tissue: Secondary | ICD-10-CM

## 2017-03-02 DIAGNOSIS — Z01818 Encounter for other preprocedural examination: Secondary | ICD-10-CM | POA: Diagnosis not present

## 2017-03-22 ENCOUNTER — Ambulatory Visit (INDEPENDENT_AMBULATORY_CARE_PROVIDER_SITE_OTHER): Payer: Medicare Other | Admitting: Family Medicine

## 2017-03-22 ENCOUNTER — Encounter: Payer: Self-pay | Admitting: Family Medicine

## 2017-03-22 ENCOUNTER — Other Ambulatory Visit: Payer: Self-pay

## 2017-03-22 DIAGNOSIS — M1711 Unilateral primary osteoarthritis, right knee: Secondary | ICD-10-CM

## 2017-03-22 MED ORDER — VITAMIN D (ERGOCALCIFEROL) 1.25 MG (50000 UNIT) PO CAPS: 50000.0000 [IU] | ORAL_CAPSULE | ORAL | 0 refills | Status: DC

## 2017-03-22 NOTE — Assessment & Plan Note (Signed)
Much better at this time. Possibly underlying gout was playing a role. Encourage him to continue same medications. Follow-up in 2 months

## 2017-03-22 NOTE — Patient Instructions (Addendum)
Good to see you  Ice is your friend Don't change a thing Make an appointment for 2 months just in case otherwise see me when you need me./

## 2017-03-22 NOTE — Progress Notes (Signed)
[facility] [address] [place], [place] [address] Phone: [phone] Subjective:    I'm seeing this patient by the request  of:  [name], MD   OT:LXBW pain right  Follow-up  IOM:[id number]  Deniel D Nicol is a 71 y.o. male coming in with complaint of right-sided knee pain. Patient was seen previously and does have known knee arthritis. Patient elected do conservative therapy including home exercises, icing, as well as topical anti-inflammatories. Patient states much better at this time. Patient states that he percent better. Feels that the medication has been very beneficial including allopurinol at a low dose. Happy with the results of far.  Past Medical History:  Diagnosis Date  . Acute right-sided CHF (congestive heart failure) ([place])    a. 01/2013.  . Arthritis    "knees and hands; thoracic area of the spine" (01/05/2013)  . BRONCHITIS, CHRONIC 01/02/2009  . COMMON MIGRAINE    "none since treating for high BP"  . Complication of anesthesia    "aspiration pneumonia after hand OR" (01/05/2013)  . DIABETES MELLITUS, TYPE II   . GOUT   . HYPERLIPIDEMIA   . HYPERTENSION   . Long term (current) use of anticoagulants   . Morbid obesity ([place])   . OBESITY HYPOVENTILATION SYNDROME    a. on home O2.  . On home oxygen therapy   . OSA (obstructive sleep apnea)   . Permanent atrial fibrillation ([place])   . PROSTATE SPECIFIC ANTIGEN, ELEVATED 06/09/2007  . Pulmonary HTN ([place])    a. multifactorial including obstructive sleep apnea, obesity hypoventilation syndrome and possible pulmonary venous hypertension.  Marland Kitchen SKIN RASH 03/12/2010  . Unspecified hearing loss    Past Surgical History:  Procedure Laterality Date  . APPENDECTOMY  1974  . CARDIOVERSION  05/21/2008; ~ 06/2008  . HERNIA REPAIR    . INGUINAL HERNIA REPAIR Right   . RIGHT HEART CATHETERIZATION N/A 01/08/2013   Procedure: RIGHT HEART CATH;  Surgeon: Thayer Headings, MD;  Location: [facility] CATH LAB;   Service: Cardiovascular;  Laterality: N/A;  . TENDON REPAIR Right 2009   "lacerated his tendon and pulley" Dr. Lenon Curt  . UMBILICAL HERNIA REPAIR     Social History   Social History  . Marital status: Married    Spouse name: N/A  . Number of children: 2  . 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 Smoking status: Former Smoker    Packs/day: 1.00    Years: 15.00    Types: Cigarettes    Quit date: 08/09/1978  . Smokeless tobacco: Never Used     Comment: 01/05/2013 "quit smoking ~ 40 years ago"  . Alcohol use 0.0 oz/week     Comment: 01/05/2013 "hasn't had a drink since 1980's; never had problem w/it"  . Drug use: No  . Sexual activity: Not Currently   Other Topics Concern  . None   Social History Narrative  . None   Allergies  Allergen Reactions  . Codeine     Altered mental status "goofy"  . Heparin Rash     Abdominal rash 01/2013   Family History  Problem Relation Age of Onset  . Alzheimer's disease Father   . Prostate cancer Father   . Cancer Other        Breast Cancer, <50 yo 1st degree relative  . Coronary artery disease Other  Male, 1st degree relative    Past medical history, social, surgical and family history all reviewed in electronic medical record.  No pertanent information unless stated regarding to the chief complaint.   Review of Systems: No headache, visual changes, nausea, vomiting, diarrhea, constipation, dizziness, abdominal pain, skin rash, fevers, chills, night sweats, weight loss, swollen lymph nodes, joint swelling, chest pain,  mood changes.  Positive body aches, shortness of breath, muscle aches  Objective  Blood pressure 112/80, pulse 62, height 5\' 8"  (1.727 m), weight 286 lb (129.7 kg). Systems examined below as of 03/22/17   General: No apparent distress alert and oriented x3 mood and affect normal, dressed appropriately. Wearing oxygen HEENT: Pupils  equal, extraocular movements intact  Respiratory: Patient's unable to speak in full sentences and does not appear short of breath  Cardiovascular: No lower extremity edema, non tender, no erythema  Skin: Warm dry intact with no signs of infection or rash on extremities or on axial skeleton.  Abdomen: Soft nontender  Neuro: Cranial nerves II through XII are intact, neurovascularly intact in all extremities with 2+ DTRs and 2+ pulses.  Lymph: No lymphadenopathy of posterior or anterior cervical chain or axillae bilaterally.  Gait Antalgic gait  MSK:  Non tender with full range of motion and good stability and symmetric strength and tone of shoulders, elbows, wrist, hip, and ankles bilaterally. Severe arthritic changes of multiple joints Knee: valgus deformity noted. Large thigh to calf ratio.  Tender to palpation over medial and PF joint line. Less tenderness impedance exam ROM full in flexion and extension and lower leg rotation. instability with valgus force.  Minimal painful patellar compression. Patellar glide with mild crepitus. Patellar and quadriceps tendons unremarkable. Hamstring and quadriceps strength is normal. Contralateral knee shows mild arthritic changes as well    Impression and Recommendations:     This case required medical decision making of moderate complexity.      Note: This dictation was prepared with Dragon dictation along with smaller phrase technology. Any transcriptional errors that result from this process are unintentional.

## 2017-04-04 ENCOUNTER — Other Ambulatory Visit: Payer: Self-pay | Admitting: Internal Medicine

## 2017-04-19 ENCOUNTER — Other Ambulatory Visit (INDEPENDENT_AMBULATORY_CARE_PROVIDER_SITE_OTHER): Payer: Medicare Other

## 2017-04-19 ENCOUNTER — Encounter: Payer: Self-pay | Admitting: Internal Medicine

## 2017-04-19 ENCOUNTER — Ambulatory Visit (INDEPENDENT_AMBULATORY_CARE_PROVIDER_SITE_OTHER): Payer: Medicare Other | Admitting: Internal Medicine

## 2017-04-19 VITALS — BP 116/72 | HR 65 | Temp 98.0°F | Ht 68.0 in | Wt 287.0 lb

## 2017-04-19 DIAGNOSIS — E785 Hyperlipidemia, unspecified: Secondary | ICD-10-CM | POA: Diagnosis not present

## 2017-04-19 DIAGNOSIS — R972 Elevated prostate specific antigen [PSA]: Secondary | ICD-10-CM | POA: Diagnosis not present

## 2017-04-19 DIAGNOSIS — Z23 Encounter for immunization: Secondary | ICD-10-CM

## 2017-04-19 DIAGNOSIS — I10 Essential (primary) hypertension: Secondary | ICD-10-CM

## 2017-04-19 DIAGNOSIS — E0800 Diabetes mellitus due to underlying condition with hyperosmolarity without nonketotic hyperglycemic-hyperosmolar coma (NKHHC): Secondary | ICD-10-CM

## 2017-04-19 LAB — CBC WITH DIFFERENTIAL/PLATELET
BASOS ABS: 0.1 10*3/uL (ref 0.0–0.1)
Basophils Relative: 0.8 % (ref 0.0–3.0)
EOS PCT: 1.8 % (ref 0.0–5.0)
Eosinophils Absolute: 0.2 10*3/uL (ref 0.0–0.7)
HCT: 49.5 % (ref 39.0–52.0)
HEMOGLOBIN: 15.7 g/dL (ref 13.0–17.0)
Lymphocytes Relative: 18.4 % (ref 12.0–46.0)
Lymphs Abs: 2.1 10*3/uL (ref 0.7–4.0)
MCHC: 31.7 g/dL (ref 30.0–36.0)
MCV: 88.3 fl (ref 78.0–100.0)
MONOS PCT: 6.9 % (ref 3.0–12.0)
Monocytes Absolute: 0.8 10*3/uL (ref 0.1–1.0)
Neutro Abs: 8.2 10*3/uL — ABNORMAL HIGH (ref 1.4–7.7)
Neutrophils Relative %: 72.1 % (ref 43.0–77.0)
Platelets: 261 10*3/uL (ref 150.0–400.0)
RBC: 5.6 Mil/uL (ref 4.22–5.81)
RDW: 16.7 % — ABNORMAL HIGH (ref 11.5–15.5)
WBC: 11.4 10*3/uL — AB (ref 4.0–10.5)

## 2017-04-19 LAB — MICROALBUMIN / CREATININE URINE RATIO
Creatinine,U: 40.5 mg/dL
MICROALB UR: 3.7 mg/dL — AB (ref 0.0–1.9)
Microalb Creat Ratio: 9.1 mg/g (ref 0.0–30.0)

## 2017-04-19 LAB — LIPID PANEL
CHOLESTEROL: 114 mg/dL (ref 0–200)
HDL: 41.9 mg/dL (ref >39.00)
LDL CALC: 43 mg/dL (ref 0–99)
NonHDL: 71.66
Total CHOL/HDL Ratio: 3
Triglycerides: 143 mg/dL (ref 0.0–149.0)
VLDL: 28.6 mg/dL (ref 0.0–40.0)

## 2017-04-19 LAB — BASIC METABOLIC PANEL
BUN: 19 mg/dL (ref 6–23)
CALCIUM: 9.2 mg/dL (ref 8.4–10.5)
CO2: 39 mEq/L — ABNORMAL HIGH (ref 19–32)
CREATININE: 1.06 mg/dL (ref 0.40–1.50)
Chloride: 98 mEq/L (ref 96–112)
GFR: 73.12 mL/min (ref >60.00)
GLUCOSE: 161 mg/dL — AB (ref 70–99)
Potassium: 4.2 mEq/L (ref 3.5–5.1)
SODIUM: 145 meq/L (ref 135–145)

## 2017-04-19 LAB — URINALYSIS, ROUTINE W REFLEX MICROSCOPIC
Bilirubin Urine: NEGATIVE
HGB URINE DIPSTICK: NEGATIVE
KETONES UR: NEGATIVE
LEUKOCYTES UA: NEGATIVE
NITRITE: NEGATIVE
PH: 6 (ref 5.0–8.0)
Specific Gravity, Urine: <=1.005 — AB (ref 1.000–1.030)
TOTAL PROTEIN, URINE-UPE24: NEGATIVE
UROBILINOGEN UA: 0.2 (ref 0.0–1.0)
Urine Glucose: NEGATIVE

## 2017-04-19 LAB — HEPATIC FUNCTION PANEL
ALBUMIN: 3.7 g/dL (ref 3.5–5.2)
ALT: 10 U/L (ref 0–53)
AST: 11 U/L (ref 0–37)
Alkaline Phosphatase: 64 U/L (ref 39–117)
Bilirubin, Direct: 0.2 mg/dL (ref 0.0–0.3)
TOTAL PROTEIN: 6.8 g/dL (ref 6.0–8.3)
Total Bilirubin: 0.9 mg/dL (ref 0.2–1.2)

## 2017-04-19 LAB — PSA: PSA: 1.26 ng/mL (ref 0.10–4.00)

## 2017-04-19 LAB — TSH: TSH: 1.43 u[IU]/mL (ref 0.35–4.50)

## 2017-04-19 LAB — HEMOGLOBIN A1C: Hgb A1c MFr Bld: 6.8 % — ABNORMAL HIGH (ref 4.6–6.5)

## 2017-04-19 MED ORDER — GLUCOSE BLOOD VI STRP: ORAL_STRIP | 12 refills | Status: DC

## 2017-04-19 MED ORDER — LANCETS MISC: 12 refills | Status: DC

## 2017-04-19 NOTE — Patient Instructions (Addendum)

## 2017-04-19 NOTE — Progress Notes (Signed)
Subjective:    Patient ID: [name] [name], male    DOB: [birth year], 71 y.o.   MRN: [id number]  HPI  Here for yearly f/u;  Overall doing ok;  Pt denies Chest pain, worsening SOB, DOE, wheezing, orthopnea, PND, worsening LE edema, palpitations, dizziness or syncope.  Pt denies neurological change such as new headache, facial or extremity weakness.  Pt denies polydipsia, polyuria, or low sugar symptoms. Pt states overall good compliance with treatment and medications, good tolerability, and has been trying to follow appropriate diet.  Pt denies worsening depressive symptoms, suicidal ideation or panic. No fever, night sweats, wt loss, loss of appetite, or other constitutional symptoms.  Pt states good ability with ADL's, has low fall risk, home safety reviewed and adequate, no other significant changes in hearing or vision, and not generally active with exercise. Conts ons 4L home o2 and stable recently.  . Has known cataract from last visit to optho - may need surgury eventually.  Due for flu shot and f/u labs. Denies urinary symptoms such as dysuria, frequency, urgency, flank pain, hematuria or n/v, fever, chills. Past Medical History:  Diagnosis Date  . Acute right-sided CHF (congestive heart failure) ([place])    a. 01/2013.  . Arthritis    "knees and hands; thoracic area of the spine" (01/05/2013)  . BRONCHITIS, CHRONIC 01/02/2009  . COMMON MIGRAINE    "none since treating for high BP"  . Complication of anesthesia    "aspiration pneumonia after hand OR" (01/05/2013)  . DIABETES MELLITUS, TYPE II   . GOUT   . HYPERLIPIDEMIA   . HYPERTENSION   . Long term (current) use of anticoagulants   . Morbid obesity ([place])   . OBESITY HYPOVENTILATION SYNDROME    a. on home O2.  . On home oxygen therapy   . OSA (obstructive sleep apnea)   . Permanent atrial fibrillation ([place])   . PROSTATE SPECIFIC ANTIGEN, ELEVATED 06/09/2007  . Pulmonary HTN ([place])    a. multifactorial including obstructive sleep apnea, obesity  hypoventilation syndrome and possible pulmonary venous hypertension.  Marland Kitchen SKIN RASH 03/12/2010  . Unspecified hearing loss    Past Surgical History:  Procedure Laterality Date  . APPENDECTOMY  1974  . CARDIOVERSION  05/21/2008; ~ 06/2008  . HERNIA REPAIR    . INGUINAL HERNIA REPAIR Right   . RIGHT HEART CATHETERIZATION N/A 01/08/2013   Procedure: RIGHT HEART CATH;  Surgeon: Thayer Headings, MD;  Location: [facility] CATH LAB;  Service: Cardiovascular;  Laterality: N/A;  . TENDON REPAIR Right 2009   "lacerated his tendon and pulley" Dr. Lenon Curt  . UMBILICAL HERNIA REPAIR      reports that he quit smoking about 38 years ago. His smoking use included Cigarettes. He has a 15.00 pack-year smoking history. He has never used smokeless tobacco. He reports that he drinks alcohol. He reports that he does not use drugs. family history includes Alzheimer's disease in his father; Cancer in his other; Coronary artery disease in his other; Prostate cancer in his father. Allergies  Allergen Reactions  . Codeine     Altered mental status "goofy"  . Heparin Rash     Abdominal rash 01/2013   Current Outpatient Prescriptions on File Prior to Visit  Medication Sig Dispense Refill  . allopurinol (ZYLOPRIM) 100 MG tablet TAKE 1 TABLET(100 MG) BY MOUTH DAILY 90 tablet 0  . apixaban (ELIQUIS) 5 MG TABS tablet Take 1 tablet (5 mg total) by mouth 2 (two) times daily. 180 tablet 3  .  atorvastatin (LIPITOR) 20 MG tablet TAKE 1 TABLET DAILY 90 tablet 2  . cetirizine (ZYRTEC) 10 MG tablet Take 10 mg by mouth at bedtime.    Marland Kitchen diltiazem (CARDIZEM CD) 180 MG 24 hr capsule Take 1 capsule (180 mg total) by mouth daily. 90 capsule 3  . glipiZIDE (GLUCOTROL XL) 10 MG 24 hr tablet TAKE 1 TABLET DAILY WITH BREAKFAST 90 tablet 3  . metFORMIN (GLUCOPHAGE XR) 500 MG 24 hr tablet Take 2 tablets (1,000 mg total) by mouth daily with breakfast. 180 tablet 3  . metoprolol tartrate (LOPRESSOR) 25 MG tablet Take 1 tablet (25 mg total) by mouth 2  (two) times daily. 180 tablet 3  . OXYGEN Inhale 3-5 L into the lungs daily. 3L Daily  4L Resting  5L Exertion    . potassium chloride SA (KLOR-CON M20) 20 MEQ tablet Take 1 tablet (20 mEq total) by mouth 2 (two) times daily. 180 tablet 3  . torsemide (DEMADEX) 20 MG tablet Take 2 tablets (40 mg total) by mouth 2 (two) times daily. 360 tablet 2  . traMADol (ULTRAM) 50 MG tablet Take 1 tablet (50 mg total) by mouth every 6 (six) hours as needed. 60 tablet 2  . Vitamin D, Ergocalciferol, (DRISDOL) 50000 units CAPS capsule Take 1 capsule (50,000 Units total) by mouth every 7 (seven) days. 12 capsule 0   No current facility-administered medications on file prior to visit.    Review of Systems Constitutional: Negative for other unusual diaphoresis, sweats, appetite or weight changes HENT: Negative for other worsening hearing loss, ear pain, facial swelling, mouth sores or neck stiffness.   Eyes: Negative for other worsening pain, redness or other visual disturbance.  Respiratory: Negative for other stridor or swelling Cardiovascular: Negative for other palpitations or other chest pain  Gastrointestinal: Negative for worsening diarrhea or loose stools, blood in stool, distention or other pain Genitourinary: Negative for hematuria, flank pain or other change in urine volume.  Musculoskeletal: Negative for myalgias or other joint swelling.  Skin: Negative for other color change, or other wound or worsening drainage.  Neurological: Negative for other syncope or numbness. Hematological: Negative for other adenopathy or swelling Psychiatric/Behavioral: Negative for hallucinations, other worsening agitation, SI, self-injury, or new decreased concentration All other system neg per pt    Objective:   Physical Exam BP 116/72   Pulse 65   Temp 98 F (36.7 C) (Oral)   Ht 5\' 8"  (1.727 m)   Wt 287 lb (130.2 kg)   SpO2 (!) 88%   BMI 43.64 kg/m  VS noted, obese, on home o2, NAD Constitutional: Pt is  oriented to person, place, and time. Appears well-developed and well-nourished, in no significant distress and comfortable Head: Normocephalic and atraumatic  Eyes: Conjunctivae and EOM are normal. Pupils are equal, round, and reactive to light Right Ear: External ear normal without discharge Left Ear: External ear normal without discharge Nose: Nose without discharge or deformity Mouth/Throat: Oropharynx is without other ulcerations and moist  Neck: Normal range of motion. Neck supple. No JVD present. No tracheal deviation present or significant neck LA or mass Cardiovascular: Normal rate, regular rhythm, normal heart sounds and intact distal pulses.   Pulmonary/Chest: WOB normal and breath sounds without rales or wheezing  Abdominal: Soft. Bowel sounds are normal. NT. No HSM  Musculoskeletal: Normal range of motion. Exhibits chronic 1+ edema Lymphadenopathy: Has no other cervical adenopathy.  Neurological: Pt is alert and oriented to person, place, and time. Pt has normal reflexes. No  cranial nerve deficit. Motor grossly intact, Gait intact Skin: Skin is warm and dry. No rash noted or new ulcerations Psychiatric:  Has normal mood and affect. Behavior is normal without agitation No other exam findings Lab Results  Component Value Date   WBC 11.1 (H) 04/13/2016   HGB 15.6 04/13/2016   HCT 47.2 04/13/2016   PLT 253.0 04/13/2016   GLUCOSE 129 (H) 10/13/2016   CHOL 118 10/13/2016   TRIG 107.0 10/13/2016   HDL 41.20 10/13/2016   LDLCALC 56 10/13/2016   ALT 13 10/13/2016   AST 12 10/13/2016   NA 143 10/13/2016   K 3.8 10/13/2016   CL 101 10/13/2016   CREATININE 0.93 10/13/2016   BUN 14 10/13/2016   CO2 34 (H) 10/13/2016   TSH 1.24 04/13/2016   PSA 1.50 04/13/2016   INR 1.25 01/09/2013   HGBA1C 6.7 (H) 10/13/2016   MICROALBUR 1.0 04/13/2016      Assessment & Plan:

## 2017-04-23 NOTE — Assessment & Plan Note (Signed)
stable overall by history and exam, recent data reviewed with pt, and pt to continue medical treatment as before,  to f/u any worsening symptoms or concerns Lab Results  Component Value Date   LDLCALC 43 04/19/2017

## 2017-04-23 NOTE — Assessment & Plan Note (Signed)
stable overall by history and exam, recent data reviewed with pt, and pt to continue medical treatment as before,  to f/u any worsening symptoms or concerns BP Readings from Last 3 Encounters:  04/19/17 116/72  03/22/17 112/80  02/21/17 112/70

## 2017-04-23 NOTE — Assessment & Plan Note (Signed)
stable overall by history and exam, recent data reviewed with pt, and pt to continue medical treatment as before,  to f/u any worsening symptoms or concerns, for f/u lab 

## 2017-04-23 NOTE — Assessment & Plan Note (Signed)
Asympt, also for f/u psa 

## 2017-05-09 ENCOUNTER — Other Ambulatory Visit: Payer: Self-pay | Admitting: Internal Medicine

## 2017-05-09 ENCOUNTER — Other Ambulatory Visit: Payer: Self-pay | Admitting: Family Medicine

## 2017-05-13 ENCOUNTER — Telehealth: Payer: Self-pay | Admitting: Internal Medicine

## 2017-05-13 ENCOUNTER — Ambulatory Visit (INDEPENDENT_AMBULATORY_CARE_PROVIDER_SITE_OTHER): Payer: Medicare Other | Admitting: Pulmonary Disease

## 2017-05-13 ENCOUNTER — Encounter: Payer: Self-pay | Admitting: Pulmonary Disease

## 2017-05-13 VITALS — BP 112/70 | HR 98 | Ht 68.0 in | Wt 284.0 lb

## 2017-05-13 DIAGNOSIS — J9611 Chronic respiratory failure with hypoxia: Secondary | ICD-10-CM

## 2017-05-13 DIAGNOSIS — J9612 Chronic respiratory failure with hypercapnia: Secondary | ICD-10-CM

## 2017-05-13 DIAGNOSIS — G4733 Obstructive sleep apnea (adult) (pediatric): Secondary | ICD-10-CM

## 2017-05-13 DIAGNOSIS — E662 Morbid (severe) obesity with alveolar hypoventilation: Secondary | ICD-10-CM

## 2017-05-13 DIAGNOSIS — Z6841 Body Mass Index (BMI) 40.0 and over, adult: Secondary | ICD-10-CM

## 2017-05-13 MED ORDER — ALLOPURINOL 100 MG PO TABS: ORAL_TABLET | ORAL | 1 refills | Status: DC

## 2017-05-13 MED ORDER — GLUCOSE BLOOD VI STRP: ORAL_STRIP | 2 refills | Status: DC

## 2017-05-13 NOTE — Telephone Encounter (Signed)
Pt wife came by and pt also need refill on   Allopurinol  Test strips    [facility] script at a 90 day

## 2017-05-13 NOTE — Patient Instructions (Signed)
Will arrange for new Bipap mask and supplies  Monitor your oxygen level at home >> goal is to keep level at 88% or higher.  If it is running lower than this, then you need to increase the liter flow rate on your oxygen tank  Follow up in 1 year

## 2017-05-13 NOTE — Telephone Encounter (Signed)
Reviewed chart pt is up-to-date sent refills to mail order../lmb  

## 2017-05-13 NOTE — Progress Notes (Signed)
Current Outpatient Prescriptions on File Prior to Visit  Medication Sig  . allopurinol (ZYLOPRIM) 100 MG tablet TAKE 1 TABLET(100 MG) BY MOUTH DAILY  . apixaban (ELIQUIS) 5 MG TABS tablet Take 1 tablet (5 mg total) by mouth 2 (two) times daily.  Marland Kitchen atorvastatin (LIPITOR) 20 MG tablet TAKE 1 TABLET DAILY  . cetirizine (ZYRTEC) 10 MG tablet Take 10 mg by mouth at bedtime.  Marland Kitchen diltiazem (CARDIZEM CD) 180 MG 24 hr capsule Take 1 capsule (180 mg total) by mouth daily.  Marland Kitchen glipiZIDE (GLUCOTROL XL) 10 MG 24 hr tablet TAKE 1 TABLET DAILY WITH BREAKFAST  . glucose blood (ONE TOUCH ULTRA TEST) test strip Use as instructed once dialy E11.9  . Lancets MISC Use as directed once daily  E11.9  . metFORMIN (GLUCOPHAGE-XR) 500 MG 24 hr tablet TAKE 2 TABLETS DAILY WITH BREAKFAST  . metoprolol tartrate (LOPRESSOR) 25 MG tablet Take 1 tablet (25 mg total) by mouth 2 (two) times daily.  . OXYGEN Inhale 3-5 L into the lungs daily. 3L Daily  4L Resting  5L Exertion  . potassium chloride SA (KLOR-CON M20) 20 MEQ tablet Take 1 tablet (20 mEq total) by mouth 2 (two) times daily.  Marland Kitchen torsemide (DEMADEX) 20 MG tablet Take 2 tablets (40 mg total) by mouth 2 (two) times daily.  . traMADol (ULTRAM) 50 MG tablet Take 1 tablet (50 mg total) by mouth every 6 (six) hours as needed.  . Vitamin D, Ergocalciferol, (DRISDOL) 50000 units CAPS capsule Take 1 capsule (50,000 Units total) by mouth every 7 (seven) days.  . Vitamin D, Ergocalciferol, (DRISDOL) 50000 units CAPS capsule TAKE 1 CAPSULE EVERY 7 DAYS   No current facility-administered medications on file prior to visit.     Chief Complaint  Patient presents with  . Follow-up    Pt is using CPAP machine, not enrolled with [facility]. Pt's wife forgot the SD card today but will bring it by next week for download. Pt is in need of new mask and supplies for CPAP. DME -[facility]. Pt uses 4 liters O2 pulse. When pt walked back into exam room, O2 was 85% on 4 liter pulse, placed pt on O2  con't 4 liters, back up to 92%; doing well over all.     Sleep tests PSG 06/18/08 >> AHI 64, BPAP 18/9 with 4 liters oxygen ONO with BiPAP and 4 liters 01/18/13 >> Test time 8 hrs 55 min. Basal SpO2 91.7%, low SpO2 83%. Spent 32 min with SpO2 < 88%. Auto BiPAP 02/01/13 to 02/14/13 >> Used on 14 of 14 nights with average 9 hrs 58 min.  Average AHI 13 (mostly hypopneas) with median IPAP 12 cm H2O and EPAP 7 cm H2O. BiPAP 11/21/13 to 02/18/14 >> used on 90 of 90 nights with average 8 hrs 48 min. Average AHI 11.7 with median EPAP 9 cm H2O and maximum IPAP 11 cm H2O.  Pulmonary tests ABG 02/09/08 >> pH 7.37, PCO2 55.1, PO2 76.4 Spirometry 04/03/09 >> FEV1 2.08(69%), FEV1% 77 PFT 02/16/13 >> FEV1 2.06 (76%), FEV1% 85, TLC 1.64 (74%), DLCO 78%, no BD  Cardiac tests Echo 12/24/15 >> EF 55 to 60%, severe LA dilation  Past medical history HTN, A fib, HLD, DM, Gout, Migraine HA   Past surgical history, Family history, Social history, Allergies reviewed  Vital signs BP 112/70 (BP Location: Left Arm, Cuff Size: Normal)   Pulse 98   Ht 5\' 8"  (1.727 m)   Wt 284 lb (128.8 kg)   SpO2  92%   BMI 43.18 kg/m    History of Present Illness: [name] [name] is a 71 y.o. male severe OSA, OHS, and chronic bronchitis with history of tobacco abuse.  I last saw him in 2016.    He has been getting more winded and fatigued with exertion.  He is using 4 liters pulsed oxygen with exertion.  His SpO2 on arrival today was 85%.  Increased to above 90% with change to continuous flow.    His leg swelling has been stable.  He is not having chest pain, palpitations, cough, sputum, or fever.  He is not very active.    He uses Bipap nightly.  No issue with mask fit.  He has not received new supplies in a while.  He feels like his sleep is fine, and his wife doesn't hear him snoring.  Physical Exam:  General - pleasant, wearing oxygen Eyes - pupils reactive ENT - no sinus tenderness, no oral exudate, no LAN, MP 3 Cardiac  - regular, no murmur Chest - no wheeze, rales Abd - soft, non tender Ext - 2+ non pitting edema Skin - chronic venous stasis changes Neuro - normal strength Psych - normal mood  Assessment/Plan:  Obstructive sleep apnea. - he was tried on CPAP and failed previously - he is compliant with Bipap and reports benefit - he will drop off his download next week and will call him with results and then determine if he needs to get set up with new machine since his current device is more than 71 yrs old - will arrange for new supplies  Chronic hypoxic/hypercapnic respiratory failure 2nd to obesity hypoventilation syndrome. - advised that goal for SpO2 is > 88% - he will monitor oxygen levels at home and adjust up as needed - likely will need 4 liters with rest and Bipap at night, and 5 liters with exertion  Obesity. - discussed importance of weight loss   Patient Instructions  Will arrange for new Bipap mask and supplies  Monitor your oxygen level at home >> goal is to keep level at 88% or higher.  If it is running lower than this, then you need to increase the liter flow rate on your oxygen tank  Follow up in 1 year   Chesley Mires, MD [facility] Pager:  [phone] 05/13/2017, 9:36 AM

## 2017-05-31 ENCOUNTER — Ambulatory Visit (INDEPENDENT_AMBULATORY_CARE_PROVIDER_SITE_OTHER): Payer: Medicare Other | Admitting: Family Medicine

## 2017-05-31 ENCOUNTER — Telehealth: Payer: Self-pay | Admitting: Pulmonary Disease

## 2017-05-31 ENCOUNTER — Encounter: Payer: Self-pay | Admitting: Family Medicine

## 2017-05-31 DIAGNOSIS — M1711 Unilateral primary osteoarthritis, right knee: Secondary | ICD-10-CM

## 2017-05-31 NOTE — Patient Instructions (Addendum)
Good to see you  COnsider the brace at a later date pennsaid pinkie amount topically 2 times daily as needed.   Continue everything you are doing.  See me when you need me.

## 2017-05-31 NOTE — Telephone Encounter (Signed)
Download has been placed in VS's cubby for review. Will route to [facility] to make aware.

## 2017-05-31 NOTE — Assessment & Plan Note (Signed)
Discussed with patient. Patient is doing well. Does not want any intervention. Due to patient's abnormal thigh to calf ratio custom brace will be needed if patient continues to have instability. We'll consider this further evaluation. Patient does well he can follow-up as needed.

## 2017-05-31 NOTE — Progress Notes (Signed)
[name] [address], [place] [address] Phone: [phone] Subjective:      CC: Right knee pain follow-up  PYP:[id number]  [name] [name] is a 71 y.o. male coming in with complaint of right knee pain. Patient has known degenerative arthritis of the knee. Asian actually has been more active. Feels like he is only taking the pain medication intermittently once every 2 weeks. Patient still has some mild instability of the knee but states doing well.  Patient was very active this past weekend and states that did not have any worsening pain.       Past Medical History:  Diagnosis Date  . Acute right-sided CHF (congestive heart failure) ([place])    a. 01/2013.  . Arthritis    "knees and hands; thoracic area of the spine" (01/05/2013)  . BRONCHITIS, CHRONIC 01/02/2009  . COMMON MIGRAINE    "none since treating for high BP"  . Complication of anesthesia    "aspiration pneumonia after hand OR" (01/05/2013)  . DIABETES MELLITUS, TYPE II   . GOUT   . HYPERLIPIDEMIA   . HYPERTENSION   . Long term (current) use of anticoagulants   . Morbid obesity ([place])   . OBESITY HYPOVENTILATION SYNDROME    a. on home O2.  . On home oxygen therapy   . OSA (obstructive sleep apnea)   . Permanent atrial fibrillation ([place])   . PROSTATE SPECIFIC ANTIGEN, ELEVATED 06/09/2007  . Pulmonary HTN ([place])    a. multifactorial including obstructive sleep apnea, obesity hypoventilation syndrome and possible pulmonary venous hypertension.  Marland Kitchen SKIN RASH 03/12/2010  . Unspecified hearing loss    Past Surgical History:  Procedure Laterality Date  . APPENDECTOMY  1974  . CARDIOVERSION  05/21/2008; ~ 06/2008  . HERNIA REPAIR    . INGUINAL HERNIA REPAIR Right   . RIGHT HEART CATHETERIZATION N/A 01/08/2013   Procedure: RIGHT HEART CATH;  Surgeon: Thayer Headings, MD;  Location: [facility] CATH LAB;  Service: Cardiovascular;  Laterality: N/A;  . TENDON REPAIR Right 2009   "lacerated his tendon and  pulley" Dr. Lenon Curt  . UMBILICAL HERNIA REPAIR     Social History   Social History  . Marital status: Married    Spouse name: N/A  . Number of children: 2  . 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 Smoking status: Former Smoker    Packs/day: 1.00    Years: 15.00    Types: Cigarettes    Quit date: 08/09/1978  . Smokeless tobacco: Never Used     Comment: 01/05/2013 "quit smoking ~ 40 years ago"  . Alcohol use 0.0 oz/week     Comment: 01/05/2013 "hasn't had a drink since 1980's; never had problem w/it"  . Drug use: No  . Sexual activity: Not Currently   Other Topics Concern  . None   Social History Narrative  . None   Allergies  Allergen Reactions  . Codeine     Altered mental status "goofy"  . Heparin Rash     Abdominal rash 01/2013   Family History  Problem Relation Age of Onset  . Alzheimer's disease Father   . Prostate cancer Father   . Cancer Other        Breast Cancer, <50 yo 1st degree relative  . Coronary artery disease Other        Male, 1st degree relative  Past medical history, social, surgical and family history all reviewed in electronic medical record.  No pertanent information unless stated regarding to the chief complaint.   Review of Systems:Review of systems updated and as accurate as of 05/31/17  No headache, visual changes, nausea, vomiting, diarrhea, constipation, dizziness, abdominal pain, skin rash, fevers, chills, night sweats, weight loss, swollen lymph nodes, body aches, joint swelling, muscle aches, chest pain, shortness of breath, mood changes.   Objective  Blood pressure 130/80, pulse 83, weight 287 lb (130.2 kg), SpO2 93 %. Systems examined below as of 05/31/17   General: No apparent distress alert and oriented x3 mood and affect normal, dressed appropriately. Wearing oxygen HEENT: Pupils equal, extraocular movements intact  Respiratory: Patient's speak  in full sentences and does not appear short of breath patient is wearing nasal cannula Cardiovascular: 3+lower extremity edema, non tender, no erythema patient does have significant hemosiderin deposits. Chronic edema changes of the skin Skin: Warm dry intact with no signs of infection or rash on extremities or on axial skeleton.  Abdomen: Soft nontender  Neuro: Cranial nerves II through XII are intact, neurovascularly intact in all extremities with 2+ DTRs and 2+ pulses.  Lymph: No lymphadenopathy of posterior or anterior cervical chain or axillae bilaterally.  Gait antalgic MSK:  Non tender with full range of motion and good stability and symmetric strength and tone of shoulders, elbows, wrist, hip, and ankles bilaterally. Arthritic changes of multiple joints Knee: Right valgus deformity noted. Large thigh to calf ratio.  Tender to palpation over medial and PF joint line.  ROM full in flexion and extension and lower leg rotation. instability with valgus force.  painful patellar compression. Patellar glide with moderate crepitus. Patellar and quadriceps tendons unremarkable. Hamstring and quadriceps strength is normal. Contralateral knee shows no changes    Impression and Recommendations:     This case required medical decision making of moderate complexity.      Note: This dictation was prepared with Dragon dictation along with smaller phrase technology. Any transcriptional errors that result from this process are unintentional.

## 2017-06-01 DIAGNOSIS — L219 Seborrheic dermatitis, unspecified: Secondary | ICD-10-CM | POA: Diagnosis not present

## 2017-06-01 DIAGNOSIS — L570 Actinic keratosis: Secondary | ICD-10-CM | POA: Diagnosis not present

## 2017-06-01 DIAGNOSIS — L821 Other seborrheic keratosis: Secondary | ICD-10-CM | POA: Diagnosis not present

## 2017-06-01 DIAGNOSIS — D225 Melanocytic nevi of trunk: Secondary | ICD-10-CM | POA: Diagnosis not present

## 2017-06-01 DIAGNOSIS — D1800 Hemangioma unspecified site: Secondary | ICD-10-CM | POA: Diagnosis not present

## 2017-06-06 NOTE — Telephone Encounter (Signed)
VS please advise on download.  Thanks!

## 2017-06-09 NOTE — Telephone Encounter (Signed)
I didn't see download in my cubby.

## 2017-06-10 NOTE — Telephone Encounter (Signed)
[name] [name] have you seen this DL?  Thanks

## 2017-06-10 NOTE — Telephone Encounter (Signed)
Left voice mail on machine for patient to return phone call back regarding his CPAP download. At this time I have not seen any CPAP download on this patient.  Needing another download at this time, patient will need to bring in [place] card as soon as possible.

## 2017-06-10 NOTE — Telephone Encounter (Signed)
I have not seen any download on this patient or in the cubby of VS. Will call the DME for another copy of the download for review. Will follow up.

## 2017-06-13 ENCOUNTER — Telehealth: Payer: Self-pay | Admitting: Pulmonary Disease

## 2017-06-13 NOTE — Telephone Encounter (Signed)
Spoke with patient. He stated that his wife brought the SD card to the office last week. Looked in VS cubby and surrounding cubbies, did not see download.   Left message for patient to call back.

## 2017-06-13 NOTE — Telephone Encounter (Signed)
Left voice mail on machine for patient to return phone call back regarding needing SD download from patient's cpap machine. He can take it to his DME or bring it my our office for the download. Will follow up again with patient at later date.

## 2017-06-14 ENCOUNTER — Telehealth: Payer: Self-pay | Admitting: Pulmonary Disease

## 2017-06-14 DIAGNOSIS — G4733 Obstructive sleep apnea (adult) (pediatric): Secondary | ICD-10-CM

## 2017-06-14 DIAGNOSIS — I272 Pulmonary hypertension, unspecified: Secondary | ICD-10-CM

## 2017-06-14 NOTE — Telephone Encounter (Signed)
Spoke with patient's wife. She stated that she would like for the patient to have an order for his oxygen to be sent to [facility]. She stated that [facility] stated that the current order is too old.   VS, please advise if you are ok with [place] placing this order.

## 2017-06-15 NOTE — Telephone Encounter (Signed)
LMTCB [name]- have you seen anything on this pt? Please advise, thanks

## 2017-06-16 NOTE — Telephone Encounter (Signed)
Okay to send order.  He needs 4 liters oxygen at rest and 5 liters with exertion.  Also needs 4 liters bleed in with Bipap at night.

## 2017-06-16 NOTE — Telephone Encounter (Signed)
Orders have been placed and will be sent to [facility] to update the order for the oxygen.

## 2017-06-20 NOTE — Telephone Encounter (Signed)
lmtcb x3 for pt. 

## 2017-06-21 NOTE — Telephone Encounter (Signed)
We have attempted to contact the pt several times with no success or call back from the pt. Per triage protocol, message will be closed.  

## 2017-07-16 ENCOUNTER — Other Ambulatory Visit: Payer: Self-pay | Admitting: Internal Medicine

## 2017-07-19 NOTE — Telephone Encounter (Signed)
Refill done.  

## 2017-08-08 ENCOUNTER — Other Ambulatory Visit: Payer: Self-pay | Admitting: Internal Medicine

## 2017-09-06 ENCOUNTER — Telehealth: Payer: Self-pay

## 2017-09-06 ENCOUNTER — Other Ambulatory Visit: Payer: Self-pay

## 2017-09-06 MED ORDER — VITAMIN D (ERGOCALCIFEROL) 1.25 MG (50000 UNIT) PO CAPS: ORAL_CAPSULE | ORAL | 2 refills | Status: DC

## 2017-09-06 NOTE — Telephone Encounter (Signed)
Routing to dr smith---patient is requesting refill on Vitamin D---when you sent in original rx, you did not give refills---are you ok with sending refill?--please advise, I will call patient back, thanks

## 2017-09-06 NOTE — Telephone Encounter (Signed)
I would tell him to take 2000 IU daily  If really lik eit then I woul refill and you can refill under my name if you would like.

## 2017-09-29 ENCOUNTER — Other Ambulatory Visit: Payer: Self-pay | Admitting: Cardiology

## 2017-10-18 ENCOUNTER — Other Ambulatory Visit: Payer: Self-pay

## 2017-10-18 MED ORDER — METOPROLOL TARTRATE 25 MG PO TABS: 25.0000 mg | ORAL_TABLET | Freq: Two times a day (BID) | ORAL | 3 refills | Status: DC

## 2017-10-19 ENCOUNTER — Other Ambulatory Visit (INDEPENDENT_AMBULATORY_CARE_PROVIDER_SITE_OTHER): Payer: Medicare Other

## 2017-10-19 ENCOUNTER — Encounter: Payer: Self-pay | Admitting: Internal Medicine

## 2017-10-19 ENCOUNTER — Ambulatory Visit (INDEPENDENT_AMBULATORY_CARE_PROVIDER_SITE_OTHER): Payer: Medicare Other | Admitting: Internal Medicine

## 2017-10-19 VITALS — BP 116/78 | HR 92 | Temp 97.7°F | Ht 68.0 in | Wt 270.0 lb

## 2017-10-19 DIAGNOSIS — E0800 Diabetes mellitus due to underlying condition with hyperosmolarity without nonketotic hyperglycemic-hyperosmolar coma (NKHHC): Secondary | ICD-10-CM

## 2017-10-19 DIAGNOSIS — Z23 Encounter for immunization: Secondary | ICD-10-CM

## 2017-10-19 DIAGNOSIS — I10 Essential (primary) hypertension: Secondary | ICD-10-CM

## 2017-10-19 DIAGNOSIS — E785 Hyperlipidemia, unspecified: Secondary | ICD-10-CM

## 2017-10-19 LAB — HEPATIC FUNCTION PANEL
ALBUMIN: 3.7 g/dL (ref 3.5–5.2)
ALK PHOS: 69 U/L (ref 39–117)
ALT: 15 U/L (ref 0–53)
AST: 14 U/L (ref 0–37)
BILIRUBIN DIRECT: 0.3 mg/dL (ref 0.0–0.3)
BILIRUBIN TOTAL: 1.1 mg/dL (ref 0.2–1.2)
Total Protein: 7.4 g/dL (ref 6.0–8.3)

## 2017-10-19 LAB — BASIC METABOLIC PANEL
BUN: 21 mg/dL (ref 6–23)
CALCIUM: 10.1 mg/dL (ref 8.4–10.5)
CHLORIDE: 100 meq/L (ref 96–112)
CO2: 37 meq/L — AB (ref 19–32)
CREATININE: 0.9 mg/dL (ref 0.40–1.50)
GFR: 88.2 mL/min (ref >60.00)
GLUCOSE: 94 mg/dL (ref 70–99)
Potassium: 4.1 mEq/L (ref 3.5–5.1)
Sodium: 143 mEq/L (ref 135–145)

## 2017-10-19 LAB — LIPID PANEL
CHOL/HDL RATIO: 3
Cholesterol: 91 mg/dL (ref 0–200)
HDL: 34.1 mg/dL — ABNORMAL LOW (ref >39.00)
LDL CALC: 36 mg/dL (ref 0–99)
NONHDL: 56.95
Triglycerides: 105 mg/dL (ref 0.0–149.0)
VLDL: 21 mg/dL (ref 0.0–40.0)

## 2017-10-19 LAB — HEMOGLOBIN A1C: HEMOGLOBIN A1C: 6.3 % (ref 4.6–6.5)

## 2017-10-19 NOTE — Assessment & Plan Note (Signed)
stable overall by history and exam, recent data reviewed with pt, and pt to continue medical treatment as before,  to f/u any worsening symptoms or concerns  

## 2017-10-19 NOTE — Patient Instructions (Addendum)
You had the Pneumovax pneumonia shot today  Please continue all other medications as before, and refills have been done if requested.  Please have the pharmacy call with any other refills you may need.  Please continue your efforts at being more active, low cholesterol diet, and weight control  Please keep your appointments with your specialists as you may have planned  Please go to the LAB in the Basement (turn left off the elevator) for the tests to be done today  You will be contacted by phone if any changes need to be made immediately.  Otherwise, you will receive a letter about your results with an explanation, but please check with MyChart first.  Please remember to sign up for MyChart if you have not done so, as this will be important to you in the future with finding out test results, communicating by private email, and scheduling acute appointments online when needed.  Please return in 6 months, or sooner if needed

## 2017-10-19 NOTE — Assessment & Plan Note (Signed)
stable overall by history and exam, recent data reviewed with pt, and pt to continue medical treatment as before,  to f/u any worsening symptoms or concerns BP Readings from Last 3 Encounters:  10/19/17 116/78  05/31/17 130/80  05/13/17 112/70

## 2017-10-19 NOTE — Assessment & Plan Note (Signed)
Lab Results  Component Value Date   HGBA1C 6.3 10/19/2017  stable overall by history and exam, recent data reviewed with pt, and pt to continue medical treatment as before,  to f/u any worsening symptoms or concerns

## 2017-10-19 NOTE — Progress Notes (Signed)
Subjective:    Patient ID: [name] [name], male    DOB: 1946-03-19, 72 y.o.   MRN: [id number]  HPI  Here to f/u; overall doing ok,  Pt denies chest pain, increasing sob or doe, wheezing, orthopnea, PND, increased LE swelling, palpitations, dizziness or syncope.  Pt denies new neurological symptoms such as new headache, or facial or extremity weakness or numbness.  Pt denies polydipsia, polyuria, or low sugar episode.  Pt states overall good compliance with meds, mostly trying to follow appropriate diet, with wt overall stable,  but little exercise however. Lost 17 lbs with better diet.  Remains on home o2 Wt Readings from Last 3 Encounters:  10/19/17 270 lb (122.5 kg)  05/31/17 287 lb (130.2 kg)  05/13/17 284 lb (128.8 kg)  Getting closer to needing left cataract removed Past Medical History:  Diagnosis Date  . Acute right-sided CHF (congestive heart failure) ([place])    a. 01/2013.  . Arthritis    "knees and hands; thoracic area of the spine" (01/05/2013)  . BRONCHITIS, CHRONIC 01/02/2009  . COMMON MIGRAINE    "none since treating for high BP"  . Complication of anesthesia    "aspiration pneumonia after hand OR" (01/05/2013)  . DIABETES MELLITUS, TYPE II   . GOUT   . HYPERLIPIDEMIA   . HYPERTENSION   . Long term (current) use of anticoagulants   . Morbid obesity ([place])   . OBESITY HYPOVENTILATION SYNDROME    a. on home O2.  . On home oxygen therapy   . OSA (obstructive sleep apnea)   . Permanent atrial fibrillation ([place])   . PROSTATE SPECIFIC ANTIGEN, ELEVATED 06/09/2007  . Pulmonary HTN ([place])    a. multifactorial including obstructive sleep apnea, obesity hypoventilation syndrome and possible pulmonary venous hypertension.  Marland Kitchen SKIN RASH 03/12/2010  . Unspecified hearing loss    Past Surgical History:  Procedure Laterality Date  . APPENDECTOMY  1974  . CARDIOVERSION  05/21/2008; ~ 06/2008  . HERNIA REPAIR    . INGUINAL HERNIA REPAIR Right   . RIGHT HEART CATHETERIZATION N/A 01/08/2013   Procedure: RIGHT HEART CATH;  Surgeon: Thayer Headings, MD;  Location: [facility] CATH LAB;  Service: Cardiovascular;  Laterality: N/A;  . TENDON REPAIR Right 2009   "lacerated his tendon and pulley" Dr. Lenon Curt  . UMBILICAL HERNIA REPAIR      reports that he quit smoking about 39 years ago. His smoking use included cigarettes. He has a 15.00 pack-year smoking history. he has never used smokeless tobacco. He reports that he drinks alcohol. He reports that he does not use drugs. family history includes Alzheimer's disease in his father; Cancer in his other; Coronary artery disease in his other; Prostate cancer in his father. Allergies  Allergen Reactions  . Codeine     Altered mental status "goofy"  . Heparin Rash     Abdominal rash 01/2013   Current Outpatient Medications on File Prior to Visit  Medication Sig Dispense Refill  . allopurinol (ZYLOPRIM) 100 MG tablet TAKE 1 TABLET(100 MG) BY MOUTH DAILY 90 tablet 1  . apixaban (ELIQUIS) 5 MG TABS tablet Take 1 tablet (5 mg total) by mouth 2 (two) times daily. 180 tablet 3  . atorvastatin (LIPITOR) 20 MG tablet TAKE 1 TABLET DAILY 90 tablet 2  . cetirizine (ZYRTEC) 10 MG tablet Take 10 mg by mouth at bedtime.    Marland Kitchen diltiazem (CARDIZEM CD) 180 MG 24 hr capsule Take 1 capsule (180 mg total) by mouth daily. [place]  capsule 3  . glipiZIDE (GLUCOTROL XL) 10 MG 24 hr tablet TAKE 1 TABLET DAILY WITH BREAKFAST 90 tablet 1  . glucose blood (ONE TOUCH ULTRA TEST) test strip Use to check blood sugars once daily Dx E11.9 100 each 2  . Lancets MISC Use as directed once daily  E11.9 100 each 12  . metFORMIN (GLUCOPHAGE-XR) 500 MG 24 hr tablet TAKE 2 TABLETS DAILY WITH BREAKFAST 180 tablet 1  . metoprolol tartrate (LOPRESSOR) 25 MG tablet Take 1 tablet (25 mg total) by mouth 2 (two) times daily. 180 tablet 3  . OXYGEN Inhale 3-5 L into the lungs daily. 3L Daily  4L Resting  5L Exertion    . potassium chloride SA (KLOR-CON M20) 20 MEQ tablet Take 1 tablet (20 mEq total) by  mouth 2 (two) times daily. 180 tablet 3  . torsemide (DEMADEX) 20 MG tablet Take 2 tablets (40 mg total) by mouth 2 (two) times daily. 360 tablet 2  . traMADol (ULTRAM) 50 MG tablet Take 1 tablet (50 mg total) by mouth every 6 (six) hours as needed. 60 tablet 2  . Vitamin D, Ergocalciferol, (DRISDOL) 50000 units CAPS capsule TAKE 1 CAPSULE EVERY 7 DAYS 12 capsule 2   No current facility-administered medications on file prior to visit.    Review of Systems  Constitutional: Negative for other unusual diaphoresis or sweats HENT: Negative for ear discharge or swelling Eyes: Negative for other worsening visual disturbances Respiratory: Negative for stridor or other swelling  Gastrointestinal: Negative for worsening distension or other blood Genitourinary: Negative for retention or other urinary change Musculoskeletal: Negative for other MSK pain or swelling Skin: Negative for color change or other new lesions Neurological: Negative for worsening tremors and other numbness  Psychiatric/Behavioral: Negative for worsening agitation or other fatigue All other system neg per pt    Objective:   Physical Exam BP 116/78   Pulse 92   Temp 97.7 F (36.5 C) (Oral)   Ht 5\' 8"  (1.727 m)   Wt 270 lb (122.5 kg)   SpO2 98%   BMI 41.05 kg/m  VS noted,  Constitutional: Pt appears in NAD HENT: Head: NCAT.  Right Ear: External ear normal.  Left Ear: External ear normal.  Eyes: . Pupils are equal, round, and reactive to light. Conjunctivae and EOM are normal Nose: without d/c or deformity Neck: Neck supple. Gross normal ROM Cardiovascular: Normal rate and regular rhythm.   Pulmonary/Chest: Effort normal and breath sounds decreased without rales or wheezing.  Neurological: Pt is alert. At baseline orientation, motor grossly intact Skin: Skin is warm. No rashes, other new lesions, no LE edema Psychiatric: Pt behavior is normal without agitation  No other exam findings     Assessment & Plan:

## 2017-10-22 ENCOUNTER — Other Ambulatory Visit: Payer: Self-pay | Admitting: Student

## 2017-10-23 ENCOUNTER — Other Ambulatory Visit: Payer: Self-pay | Admitting: Student

## 2017-10-24 NOTE — Telephone Encounter (Signed)
REFILL 

## 2017-10-24 NOTE — Telephone Encounter (Signed)
This is Dr. Crenshaw's pt 

## 2017-10-25 NOTE — Telephone Encounter (Signed)
Rx(s) sent to pharmacy electronically.  

## 2017-11-05 ENCOUNTER — Other Ambulatory Visit: Payer: Self-pay | Admitting: Student

## 2017-11-10 ENCOUNTER — Ambulatory Visit (INDEPENDENT_AMBULATORY_CARE_PROVIDER_SITE_OTHER): Payer: Medicare Other | Admitting: Physician Assistant

## 2017-11-10 ENCOUNTER — Encounter: Payer: Self-pay | Admitting: Physician Assistant

## 2017-11-10 VITALS — BP 122/82 | HR 74 | Ht 68.0 in | Wt 269.0 lb

## 2017-11-10 DIAGNOSIS — E662 Morbid (severe) obesity with alveolar hypoventilation: Secondary | ICD-10-CM

## 2017-11-10 DIAGNOSIS — G4733 Obstructive sleep apnea (adult) (pediatric): Secondary | ICD-10-CM

## 2017-11-10 DIAGNOSIS — E119 Type 2 diabetes mellitus without complications: Secondary | ICD-10-CM | POA: Diagnosis not present

## 2017-11-10 DIAGNOSIS — Z9981 Dependence on supplemental oxygen: Secondary | ICD-10-CM | POA: Diagnosis not present

## 2017-11-10 DIAGNOSIS — Z9989 Dependence on other enabling machines and devices: Secondary | ICD-10-CM | POA: Diagnosis not present

## 2017-11-10 DIAGNOSIS — E785 Hyperlipidemia, unspecified: Secondary | ICD-10-CM | POA: Diagnosis not present

## 2017-11-10 DIAGNOSIS — I5032 Chronic diastolic (congestive) heart failure: Secondary | ICD-10-CM | POA: Diagnosis not present

## 2017-11-10 DIAGNOSIS — I482 Chronic atrial fibrillation: Secondary | ICD-10-CM | POA: Diagnosis not present

## 2017-11-10 DIAGNOSIS — I4821 Permanent atrial fibrillation: Secondary | ICD-10-CM

## 2017-11-10 NOTE — Patient Instructions (Signed)
[name] Deforest, PA-c recommends that you schedule a follow-up appointment in 6 months with Dr [name]. You will receive a reminder letter in the mail two months in advance. If you don't receive a letter, please call our office to schedule the follow-up appointment.  If you need a refill on your cardiac medications before your next appointment, please call your pharmacy.

## 2017-11-10 NOTE — Progress Notes (Signed)
Cardiology Office Note    Date:  11/11/2017   ID:  RASHAUD YBARBO, DOB 05-24-1946, MRN [id number]  PCP:  [name], MD  Cardiologist:  Dr. [name]   Chief Complaint  Patient presents with  . Follow-up    seen for Dr. [name].     History of Present Illness:  [name] [name] is a 72 y.o. male with PMH of permanent atrial fibrillation on Eliquis, OSA on CPAP, chronic diastolic heart failure, hyperlipidemia, obesity hypoventilation syndrome on 4 L nasal cannula, type II DM, and possibly elevated right heart pressures by cath in June 2014.  He has failed DCCV in the past.  Myoview in July 2009 showed EF 54%, normal perfusion.  Holter monitor in February 2012 showed atrial fibrillation with mildly decreased heart rate.  Right heart failure was felt to be secondary to pulmonary hypertension which was multifactorial include obstructive sleep apnea, OSA, and possibly pulmonary venous hypertension.  Right heart cath in June 2014 showed elevated right heart pressure.  He previously had renal insufficiency and hyperkalemia on combination of ACE inhibitor and spironolactone.  Previous echocardiogram in May 2017 showed EF 55-60%.  Patient presents today for one-year follow-up.  Patient was last seen in March 2018, he was doing well. He remains on torsemide 40 mg twice daily for diuresis.  His oxygen use was also reduced down to 2 L at that time.  Patient presents to cardiology office visit today, he says he never got a reminder in the fall of last year for follow-up.  Otherwise he has been doing very well.  He has been using 4 L nasal cannula essentially 24/7 by this point.  He feels like his breathing is quite stable.  He is quite happy that in 2018 was the first time he did not have a single admission.  He is still on 40 mg twice daily of torsemide, he has chronic lower extremity mild edema, however this is unchanged.  He also made a great effort on eating right and managed to lose about 18 pounds since October  2018.  The current metoprolol and diltiazem are controlling his heart rate quite well.  He denies any recent exertional chest pain or increasing shortness of breath, he did have a persistent right lower quadrant abdominal discomfort for a few days, this resolved several weeks ago.  He says he was walking in the yard cutting bushes, and may have stretched that area.  It is no longer tender.  EKG does not show any obvious ischemic changes.   Past Medical History:  Diagnosis Date  . Acute right-sided CHF (congestive heart failure) ([place])    a. 01/2013.  . Arthritis    "knees and hands; thoracic area of the spine" (01/05/2013)  . BRONCHITIS, CHRONIC 01/02/2009  . COMMON MIGRAINE    "none since treating for high BP"  . Complication of anesthesia    "aspiration pneumonia after hand OR" (01/05/2013)  . DIABETES MELLITUS, TYPE II   . GOUT   . HYPERLIPIDEMIA   . HYPERTENSION   . Long term (current) use of anticoagulants   . Morbid obesity ([place])   . OBESITY HYPOVENTILATION SYNDROME    a. on home O2.  . On home oxygen therapy   . OSA (obstructive sleep apnea)   . Permanent atrial fibrillation ([place])   . PROSTATE SPECIFIC ANTIGEN, ELEVATED 06/09/2007  . Pulmonary HTN ([place])    a. multifactorial including obstructive sleep apnea, obesity hypoventilation syndrome and possible pulmonary venous hypertension.  Marland Kitchen  SKIN RASH 03/12/2010  . Unspecified hearing loss     Past Surgical History:  Procedure Laterality Date  . APPENDECTOMY  1974  . CARDIOVERSION  05/21/2008; ~ 06/2008  . HERNIA REPAIR    . INGUINAL HERNIA REPAIR Right   . RIGHT HEART CATHETERIZATION N/A 01/08/2013   Procedure: RIGHT HEART CATH;  Surgeon: Thayer Headings, MD;  Location: [facility] CATH LAB;  Service: Cardiovascular;  Laterality: N/A;  . TENDON REPAIR Right 2009   "lacerated his tendon and pulley" Dr. Lenon Curt  . UMBILICAL HERNIA REPAIR      Current Medications: Outpatient Medications Prior to Visit  Medication Sig Dispense Refill  .  allopurinol (ZYLOPRIM) 100 MG tablet TAKE 1 TABLET(100 MG) BY MOUTH DAILY 90 tablet 1  . atorvastatin (LIPITOR) 20 MG tablet TAKE 1 TABLET DAILY 90 tablet 2  . cetirizine (ZYRTEC) 10 MG tablet Take 10 mg by mouth at bedtime.    Marland Kitchen diltiazem (CARDIZEM CD) 180 MG 24 hr capsule Take 1 capsule (180 mg total) by mouth daily. NEED OV. 90 capsule 0  . ELIQUIS 5 MG TABS tablet TAKE 1 TABLET TWICE A DAY 180 tablet 1  . glipiZIDE (GLUCOTROL XL) 10 MG 24 hr tablet TAKE 1 TABLET DAILY WITH BREAKFAST 90 tablet 1  . glucose blood (ONE TOUCH ULTRA TEST) test strip Use to check blood sugars once daily Dx E11.9 100 each 2  . Lancets MISC Use as directed once daily  E11.9 100 each 12  . metFORMIN (GLUCOPHAGE-XR) 500 MG 24 hr tablet TAKE 2 TABLETS DAILY WITH BREAKFAST 180 tablet 1  . metoprolol tartrate (LOPRESSOR) 25 MG tablet Take 1 tablet (25 mg total) by mouth 2 (two) times daily. 180 tablet 3  . OXYGEN Inhale 3-5 L into the lungs daily. 3L Daily  4L Resting  5L Exertion    . potassium chloride SA (KLOR-CON M20) 20 MEQ tablet Take 1 tablet (20 mEq total) by mouth 2 (two) times daily. 180 tablet 3  . torsemide (DEMADEX) 20 MG tablet Take 2 tablets (40 mg total) by mouth 2 (two) times daily. PLEASE CONTACT OFFICE FOR ADDITIONAL REFILLS 360 tablet 0  . traMADol (ULTRAM) 50 MG tablet Take 1 tablet (50 mg total) by mouth every 6 (six) hours as needed. 60 tablet 2  . Vitamin D, Ergocalciferol, (DRISDOL) 50000 units CAPS capsule TAKE 1 CAPSULE EVERY 7 DAYS 12 capsule 2   No facility-administered medications prior to visit.      Allergies:   Codeine and Heparin   Social History   Socioeconomic History  . Marital status: Married    Spouse name: Not on file  . Number of children: 2  . Years of education: Not on file  . Highest education level: Not on file  Occupational History  . Occupation: Quarry manager: [employer]  Social Needs  . Financial resource strain: Not on file  . Food  insecurity:    Worry: Not on file    Inability: Not on file  . Transportation needs:    Medical: Not on file    Non-medical: Not on file  Tobacco Use  . Smoking status: Former Smoker    Packs/day: 1.00    Years: 15.00    Pack years: 15.00    Types: Cigarettes    Last attempt to quit: 08/09/1978    Years since quitting: 39.2  . Smokeless tobacco: Never Used  . Tobacco comment: 01/05/2013 "quit smoking ~ 40 years ago"  Substance  and Sexual Activity  . Alcohol use: Yes    Alcohol/week: 0.0 oz    Comment: 01/05/2013 "hasn't had a drink since 1980's; never had problem w/it"  . Drug use: No  . Sexual activity: Not Currently  Lifestyle  . Physical activity:    Days per week: Not on file    Minutes per session: Not on file  . Stress: Not on file  Relationships  . Social connections:    Talks on phone: Not on file    Gets together: Not on file    Attends religious service: Not on file    Active member of club or organization: Not on file    Attends meetings of clubs or organizations: Not on file    Relationship status: Not on file  Other Topics Concern  . Not on file  Social History Narrative  . Not on file     Family History:  The patient's family history includes Alzheimer's disease in his father; Cancer in his other; Coronary artery disease in his other; Prostate cancer in his father.   ROS:   Please see the history of present illness.    ROS All other systems reviewed and are negative.   PHYSICAL EXAM:   VS:  BP 122/82   Pulse 74   Ht 5\' 8"  (1.727 m)   Wt 269 lb (122 kg)   BMI 40.90 kg/m    GEN: Well nourished, well developed, in no acute distress  HEENT: normal  Neck: no JVD, carotid bruits, or masses Cardiac: Irregularly irregular; no murmurs, rubs, or gallops,no edema  Respiratory:  clear to auscultation bilaterally, normal work of breathing GI: soft, nontender, nondistended, + BS MS: no deformity or atrophy  Skin: warm and dry, no rash Neuro:  Alert and  Oriented x 3, Strength and sensation are intact Psych: euthymic mood, full affect  Wt Readings from Last 3 Encounters:  11/10/17 269 lb (122 kg)  10/19/17 270 lb (122.5 kg)  05/31/17 287 lb (130.2 kg)      Studies/Labs Reviewed:   EKG:  EKG is ordered today.  The ekg ordered today demonstrates atrial fibrillation, heart rate 74  Recent Labs: 04/19/2017: Hemoglobin 15.7; Platelets 261.0; TSH 1.43 10/19/2017: ALT 15; BUN 21; Creatinine, Ser 0.90; Potassium 4.1; Sodium 143   Lipid Panel    Component Value Date/Time   CHOL 91 10/19/2017 1000   TRIG 105.0 10/19/2017 1000   HDL 34.10 (L) 10/19/2017 1000   CHOLHDL 3 10/19/2017 1000   VLDL 21.0 10/19/2017 1000   LDLCALC 36 10/19/2017 1000    Additional studies/ records that were reviewed today include:   Echo 12/24/2015 LV EF: 55% -   60% Study Conclusions  - Procedure narrative: Transthoracic echocardiography. Image   quality was suboptimal, with poor valvular and endocardial   visualization. - Left ventricle: The cavity size was normal. Systolic function was   normal. The estimated ejection fraction was in the range of 55%   to 60%. Images were inadequate for LV wall motion assessment. The   study was not technically sufficient to allow evaluation of LV   diastolic dysfunction due to atrial fibrillation. - Left atrium: The atrium was severely dilated. - Right atrium: The atrium was mildly dilated. - Pericardium, extracardiac: Small posterior pericardial effusion.    ASSESSMENT:    1. Permanent atrial fibrillation ([place])   2. OSA on CPAP   3. Chronic diastolic heart failure ([place])   4. Hyperlipidemia, unspecified hyperlipidemia type   5. Obesity  hypoventilation syndrome ([place])   6. Controlled type 2 diabetes mellitus without complication, without long-term current use of insulin ([place])   7. On home O2      PLAN:  In order of problems listed above:  1. Permanent atrial fibrillation: On Eliquis 5 mg twice daily.  Rate  controlled on diltiazem and metoprolol. CHA2DS2-Vasc score 4 (HF, HTN, DM II, age)  2. Chronic diastolic heart failure: Appears to be euvolemic on 40 mg twice daily of torsemide.  Does not have any lower extremity edema, orthopnea or PND.  3. Hyperlipidemia: Continue 20 mg daily of Lipitor.  Recent lipid panel obtained on 10/19/2017 showed total cholesterol 91, HDL 34, LDL 36, triglyceride 105.  4. DM 2: Managed by primary care provider.  5. Obesity hypoventilation syndrome: On 4 L home oxygen.  He has managed to lose quite a bit of weight since last year.    Medication Adjustments/Labs and Tests Ordered: Current medicines are reviewed at length with the patient today.  Concerns regarding medicines are outlined above.  Medication changes, Labs and Tests ordered today are listed in the Patient Instructions below. Patient Instructions  Almyra Deforest, PA-c recommends that you schedule a follow-up appointment in 6 months with Dr [name]. You will receive a reminder letter in the mail two months in advance. If you don't receive a letter, please call our office to schedule the follow-up appointment.  If you need a refill on your cardiac medications before your next appointment, please call your pharmacy.    Hilbert Corrigan, [place]  11/11/2017 1:18 PM    [facility] [place], [place], [place]  [address] Phone: [phone]; Fax: [phone]

## 2017-11-11 ENCOUNTER — Encounter: Payer: Self-pay | Admitting: Physician Assistant

## 2017-11-16 ENCOUNTER — Other Ambulatory Visit: Payer: Self-pay | Admitting: Internal Medicine

## 2017-11-17 DIAGNOSIS — H2512 Age-related nuclear cataract, left eye: Secondary | ICD-10-CM | POA: Diagnosis not present

## 2017-11-17 DIAGNOSIS — H25812 Combined forms of age-related cataract, left eye: Secondary | ICD-10-CM | POA: Diagnosis not present

## 2017-12-01 DIAGNOSIS — H25811 Combined forms of age-related cataract, right eye: Secondary | ICD-10-CM | POA: Diagnosis not present

## 2017-12-08 ENCOUNTER — Other Ambulatory Visit: Payer: Self-pay | Admitting: Student

## 2017-12-08 NOTE — Telephone Encounter (Signed)
REFILL 

## 2017-12-27 ENCOUNTER — Telehealth: Payer: Self-pay | Admitting: Internal Medicine

## 2017-12-27 NOTE — Telephone Encounter (Signed)
Patients daughter dropped off parking placard form to be completed.  Placed in Brittany's box for tracking.  Daughter requesting call once ready for pickup.

## 2017-12-28 NOTE — Telephone Encounter (Signed)
Form has been signed, copy sent to scan.   Daughter Juliann Pulse has been informed it is ready to be picked up.

## 2017-12-28 NOTE — Telephone Encounter (Signed)
Renewal for parking placard have been filled out and placed in providers box to review and sign.

## 2018-01-05 DIAGNOSIS — H33101 Unspecified retinoschisis, right eye: Secondary | ICD-10-CM | POA: Diagnosis not present

## 2018-02-06 ENCOUNTER — Encounter: Payer: Self-pay | Admitting: Internal Medicine

## 2018-02-07 MED ORDER — METFORMIN HCL ER 500 MG PO TB24
ORAL_TABLET | ORAL | 1 refills | Status: DC
Start: 2018-02-07 — End: 2018-04-19

## 2018-03-19 ENCOUNTER — Other Ambulatory Visit: Payer: Self-pay | Admitting: Internal Medicine

## 2018-03-19 ENCOUNTER — Other Ambulatory Visit: Payer: Self-pay | Admitting: Student

## 2018-03-19 ENCOUNTER — Other Ambulatory Visit: Payer: Self-pay | Admitting: Cardiology

## 2018-04-19 ENCOUNTER — Other Ambulatory Visit: Payer: Self-pay | Admitting: Internal Medicine

## 2018-04-25 ENCOUNTER — Encounter: Payer: Self-pay | Admitting: Internal Medicine

## 2018-04-25 ENCOUNTER — Ambulatory Visit (INDEPENDENT_AMBULATORY_CARE_PROVIDER_SITE_OTHER): Payer: Medicare Other | Admitting: Internal Medicine

## 2018-04-25 ENCOUNTER — Other Ambulatory Visit (INDEPENDENT_AMBULATORY_CARE_PROVIDER_SITE_OTHER): Payer: Medicare Other

## 2018-04-25 VITALS — BP 138/88 | HR 79 | Temp 97.7°F | Ht 68.0 in | Wt 261.0 lb

## 2018-04-25 DIAGNOSIS — Z23 Encounter for immunization: Secondary | ICD-10-CM | POA: Diagnosis not present

## 2018-04-25 DIAGNOSIS — E0800 Diabetes mellitus due to underlying condition with hyperosmolarity without nonketotic hyperglycemic-hyperosmolar coma (NKHHC): Secondary | ICD-10-CM

## 2018-04-25 DIAGNOSIS — I10 Essential (primary) hypertension: Secondary | ICD-10-CM

## 2018-04-25 DIAGNOSIS — E785 Hyperlipidemia, unspecified: Secondary | ICD-10-CM | POA: Diagnosis not present

## 2018-04-25 DIAGNOSIS — N320 Bladder-neck obstruction: Secondary | ICD-10-CM

## 2018-04-25 LAB — CBC WITH DIFFERENTIAL/PLATELET
Basophils Absolute: 0.1 10*3/uL (ref 0.0–0.1)
Basophils Relative: 0.6 % (ref 0.0–3.0)
Eosinophils Absolute: 0.2 10*3/uL (ref 0.0–0.7)
Eosinophils Relative: 1.6 % (ref 0.0–5.0)
HCT: 46.1 % (ref 39.0–52.0)
HEMOGLOBIN: 15.2 g/dL (ref 13.0–17.0)
LYMPHS ABS: 2.5 10*3/uL (ref 0.7–4.0)
Lymphocytes Relative: 23.1 % (ref 12.0–46.0)
MCHC: 33 g/dL (ref 30.0–36.0)
MCV: 87.8 fl (ref 78.0–100.0)
MONOS PCT: 8.2 % (ref 3.0–12.0)
Monocytes Absolute: 0.9 10*3/uL (ref 0.1–1.0)
Neutro Abs: 7.2 10*3/uL (ref 1.4–7.7)
Neutrophils Relative %: 66.5 % (ref 43.0–77.0)
Platelets: 262 10*3/uL (ref 150.0–400.0)
RBC: 5.25 Mil/uL (ref 4.22–5.81)
RDW: 15.3 % (ref 11.5–15.5)
WBC: 10.8 10*3/uL — AB (ref 4.0–10.5)

## 2018-04-25 LAB — BASIC METABOLIC PANEL
BUN: 25 mg/dL — ABNORMAL HIGH (ref 6–23)
CALCIUM: 9.6 mg/dL (ref 8.4–10.5)
CO2: 31 mEq/L (ref 19–32)
Chloride: 104 mEq/L (ref 96–112)
Creatinine, Ser: 1.02 mg/dL (ref 0.40–1.50)
GFR: 76.22 mL/min (ref >60.00)
Glucose, Bld: 82 mg/dL (ref 70–99)
Potassium: 4 mEq/L (ref 3.5–5.1)
SODIUM: 145 meq/L (ref 135–145)

## 2018-04-25 LAB — HEPATIC FUNCTION PANEL
ALBUMIN: 3.9 g/dL (ref 3.5–5.2)
ALT: 12 U/L (ref 0–53)
AST: 11 U/L (ref 0–37)
Alkaline Phosphatase: 73 U/L (ref 39–117)
Bilirubin, Direct: 0.2 mg/dL (ref 0.0–0.3)
TOTAL PROTEIN: 7.2 g/dL (ref 6.0–8.3)
Total Bilirubin: 1.2 mg/dL (ref 0.2–1.2)

## 2018-04-25 LAB — LIPID PANEL
Cholesterol: 95 mg/dL (ref 0–200)
HDL: 30.4 mg/dL — AB (ref >39.00)
LDL Cholesterol: 36 mg/dL (ref 0–99)
NonHDL: 64.57
TRIGLYCERIDES: 145 mg/dL (ref 0.0–149.0)
Total CHOL/HDL Ratio: 3
VLDL: 29 mg/dL (ref 0.0–40.0)

## 2018-04-25 LAB — URINALYSIS, ROUTINE W REFLEX MICROSCOPIC
Bilirubin Urine: NEGATIVE
Hgb urine dipstick: NEGATIVE
Ketones, ur: NEGATIVE
Leukocytes, UA: NEGATIVE
Nitrite: NEGATIVE
PH: 7 (ref 5.0–8.0)
RBC / HPF: NONE SEEN (ref 0–?)
Specific Gravity, Urine: 1.01 (ref 1.000–1.030)
Total Protein, Urine: NEGATIVE
URINE GLUCOSE: NEGATIVE
UROBILINOGEN UA: 0.2 (ref 0.0–1.0)

## 2018-04-25 LAB — TSH: TSH: 1.36 u[IU]/mL (ref 0.35–4.50)

## 2018-04-25 LAB — HEMOGLOBIN A1C: Hgb A1c MFr Bld: 6.1 % (ref 4.6–6.5)

## 2018-04-25 LAB — MICROALBUMIN / CREATININE URINE RATIO
CREATININE, U: 20.7 mg/dL
Microalb Creat Ratio: 6.4 mg/g (ref 0.0–30.0)
Microalb, Ur: 1.3 mg/dL (ref 0.0–1.9)

## 2018-04-25 LAB — PSA: PSA: 1.5 ng/mL (ref 0.10–4.00)

## 2018-04-25 NOTE — Progress Notes (Signed)
Subjective:    Patient ID: [name] [name], male    DOB: 1945-09-02, 72 y.o.   MRN: [id number]  HPI    Here for yearly f/u;  Overall doing ok;  Pt denies Chest pain, worsening SOB, DOE, wheezing, orthopnea, PND, worsening LE edema, palpitations, dizziness or syncope.  Pt denies neurological change such as new headache, facial or extremity weakness.  Pt denies polydipsia, polyuria, or low sugar symptoms. Pt states overall good compliance with treatment and medications, good tolerability, and has been trying to follow appropriate diet.  Pt denies worsening depressive symptoms, suicidal ideation or panic. No fever, night sweats, wt loss, loss of appetite, or other constitutional symptoms.  Pt states good ability with ADL's, has low fall risk, home safety reviewed and adequate, no other significant changes in hearing or vision, and not active with exercise, now on 4L [place] continuous for chronic resp failure.  Has no new complaints Past Medical History:  Diagnosis Date  . Acute right-sided CHF (congestive heart failure) ([place])    a. 01/2013.  . Arthritis    "knees and hands; thoracic area of the spine" (01/05/2013)  . BRONCHITIS, CHRONIC 01/02/2009  . COMMON MIGRAINE    "none since treating for high BP"  . Complication of anesthesia    "aspiration pneumonia after hand OR" (01/05/2013)  . DIABETES MELLITUS, TYPE II   . GOUT   . HYPERLIPIDEMIA   . HYPERTENSION   . Long term (current) use of anticoagulants   . Morbid obesity ([place])   . OBESITY HYPOVENTILATION SYNDROME    a. on home O2.  . On home oxygen therapy   . OSA (obstructive sleep apnea)   . Permanent atrial fibrillation ([place])   . PROSTATE SPECIFIC ANTIGEN, ELEVATED 06/09/2007  . Pulmonary HTN ([place])    a. multifactorial including obstructive sleep apnea, obesity hypoventilation syndrome and possible pulmonary venous hypertension.  Marland Kitchen SKIN RASH 03/12/2010  . Unspecified hearing loss    Past Surgical History:  Procedure Laterality Date  .  APPENDECTOMY  1974  . CARDIOVERSION  05/21/2008; ~ 06/2008  . HERNIA REPAIR    . INGUINAL HERNIA REPAIR Right   . RIGHT HEART CATHETERIZATION N/A 01/08/2013   Procedure: RIGHT HEART CATH;  Surgeon: Thayer Headings, MD;  Location: [facility] C[facility] LAB;  Service: Cardiovascular;  Laterality: N/A;  . TENDON REPAIR Right 2009   "lacerated his tendon and pulley" Dr. Lenon Curt  . UMBILICAL HERNIA REPAIR      reports that he quit smoking about 39 years ago. His smoking use included cigarettes. He has a 15.00 pack-year smoking history. He has never used smokeless tobacco. He reports that he drinks alcohol. He reports that he does not use drugs. family history includes Alzheimer's disease in his father; Cancer in his other; Coronary artery disease in his other; Prostate cancer in his father. Allergies  Allergen Reactions  . Codeine     Altered mental status "goofy"  . Heparin Rash     Abdominal rash 01/2013   Current Outpatient Medications on File Prior to Visit  Medication Sig Dispense Refill  . allopurinol (ZYLOPRIM) 100 MG tablet TAKE 1 TABLET DAILY 90 tablet 1  . atorvastatin (LIPITOR) 20 MG tablet TAKE 1 TABLET DAILY 90 tablet 2  . cetirizine (ZYRTEC) 10 MG tablet Take 10 mg by mouth at bedtime.    Marland Kitchen diltiazem (CARDIZEM CD) 180 MG 24 hr capsule TAKE 1 CAPSULE DAILY. ( NEED OFFICE VISIT ) 90 capsule 3  . ELIQUIS 5 MG  TABS tablet TAKE 1 TABLET TWICE A DAY 180 tablet 1  . glipiZIDE (GLUCOTROL XL) 10 MG 24 hr tablet TAKE 1 TABLET DAILY WITH BREAKFAST 90 tablet 1  . glucose blood (ONE TOUCH ULTRA TEST) test strip Use to check blood sugars once daily Dx E11.9 100 each 2  . KLOR-CON M20 20 MEQ tablet TAKE 1 TABLET TWICE A DAY 180 tablet 1  . Lancets MISC Use as directed once daily  E11.9 100 each 12  . metFORMIN (GLUCOPHAGE-XR) 500 MG 24 hr tablet TAKE 2 TABLETS DAILY WITH BREAKFAST 180 tablet 4  . metoprolol tartrate (LOPRESSOR) 25 MG tablet Take 1 tablet (25 mg total) by mouth 2 (two) times daily. 180 tablet 3   . OXYGEN Inhale 3-5 L into the lungs daily. 3L Daily  4L Resting  5L Exertion    . torsemide (DEMADEX) 20 MG tablet TAKE 2 TABLETS TWICE A DAY (PLEASE CONTACT OFFICE FOR ADDITIONAL REFILLS) 360 tablet 2  . traMADol (ULTRAM) 50 MG tablet Take 1 tablet (50 mg total) by mouth every 6 (six) hours as needed. 60 tablet 2  . Vitamin D, Ergocalciferol, (DRISDOL) 50000 units CAPS capsule TAKE 1 CAPSULE EVERY 7 DAYS 12 capsule 2   No current facility-administered medications on file prior to visit.    Review of Systems  Constitutional: Negative for other unusual diaphoresis or sweats HENT: Negative for ear discharge or swelling Eyes: Negative for other worsening visual disturbances Respiratory: Negative for stridor or other swelling  Gastrointestinal: Negative for worsening distension or other blood Genitourinary: Negative for retention or other urinary change Musculoskeletal: Negative for other MSK pain or swelling Skin: Negative for color change or other new lesions Neurological: Negative for worsening tremors and other numbness  Psychiatric/Behavioral: Negative for worsening agitation or other fatigue All other system neg per pt    Objective:   Physical Exam BP 138/88   Pulse 79   Temp 97.7 F (36.5 C) (Oral)   Ht 5\' 8"  (1.727 m)   Wt 261 lb (118.4 kg)   SpO2 94%   BMI 39.68 kg/m  VS noted,  Constitutional: Pt appears in NAD HENT: Head: NCAT.  Right Ear: External ear normal.  Left Ear: External ear normal.  Eyes: . Pupils are equal, round, and reactive to light. Conjunctivae and EOM are normal Nose: without d/c or deformity Neck: Neck supple. Gross normal ROM Cardiovascular: Normal rate and regular rhythm.   Pulmonary/Chest: Effort normal and breath sounds without rales or wheezing.  Abd:  Soft, NT, ND, + BS, no organomegaly Neurological: Pt is alert. At baseline orientation, motor grossly intact Skin: Skin is warm. No rashes, other new lesions, no LE edema Psychiatric: Pt  behavior is normal without agitation  No other exam findings Lab Results  Component Value Date   WBC 11.4 (H) 04/19/2017   HGB 15.7 04/19/2017   HCT 49.5 04/19/2017   PLT 261.0 04/19/2017   GLUCOSE 94 10/19/2017   CHOL 91 10/19/2017   TRIG 105.0 10/19/2017   HDL 34.10 (L) 10/19/2017   LDLCALC 36 10/19/2017   ALT 15 10/19/2017   AST 14 10/19/2017   NA 143 10/19/2017   K 4.1 10/19/2017   CL 100 10/19/2017   CREATININE 0.90 10/19/2017   BUN 21 10/19/2017   CO2 37 (H) 10/19/2017   TSH 1.43 04/19/2017   PSA 1.26 04/19/2017   INR 1.25 01/09/2013   HGBA1C 6.3 10/19/2017   MICROALBUR 3.7 (H) 04/19/2017       Assessment &  Plan:

## 2018-04-25 NOTE — Assessment & Plan Note (Signed)
stable overall by history and exam, recent data reviewed with pt, and pt to continue medical treatment as before,  to f/u any worsening symptoms or concerns  

## 2018-04-25 NOTE — Patient Instructions (Addendum)
You had the flu shot today, and the Tdap tetanus shot today  Please continue all other medications as before, and refills have been done if requested.  Please have the pharmacy call with any other refills you may need.  Please continue your efforts at being more active, low cholesterol diet, and weight control.  You are otherwise up to date with prevention measures today.  Please keep your appointments with your specialists as you may have planned  Please go to the LAB in the Basement (turn left off the elevator) for the tests to be done today  You will be contacted by phone if any changes need to be made immediately.  Otherwise, you will receive a letter about your results with an explanation, but please check with MyChart first.  Please remember to sign up for MyChart if you have not done so, as this will be important to you in the future with finding out test results, communicating by private email, and scheduling acute appointments online when needed.  Please return in 6 months, or sooner if needed

## 2018-04-28 ENCOUNTER — Other Ambulatory Visit: Payer: Self-pay | Admitting: Family Medicine

## 2018-04-29 IMAGING — DX DG CHEST 2V
2 series · 2 of 2 positions shown · non-contrast
Comparison: 01/06/2013

CLINICAL DATA: Shortness of breath.  History of CHF.

EXAM:
CHEST  2 VIEW

[chest pa]
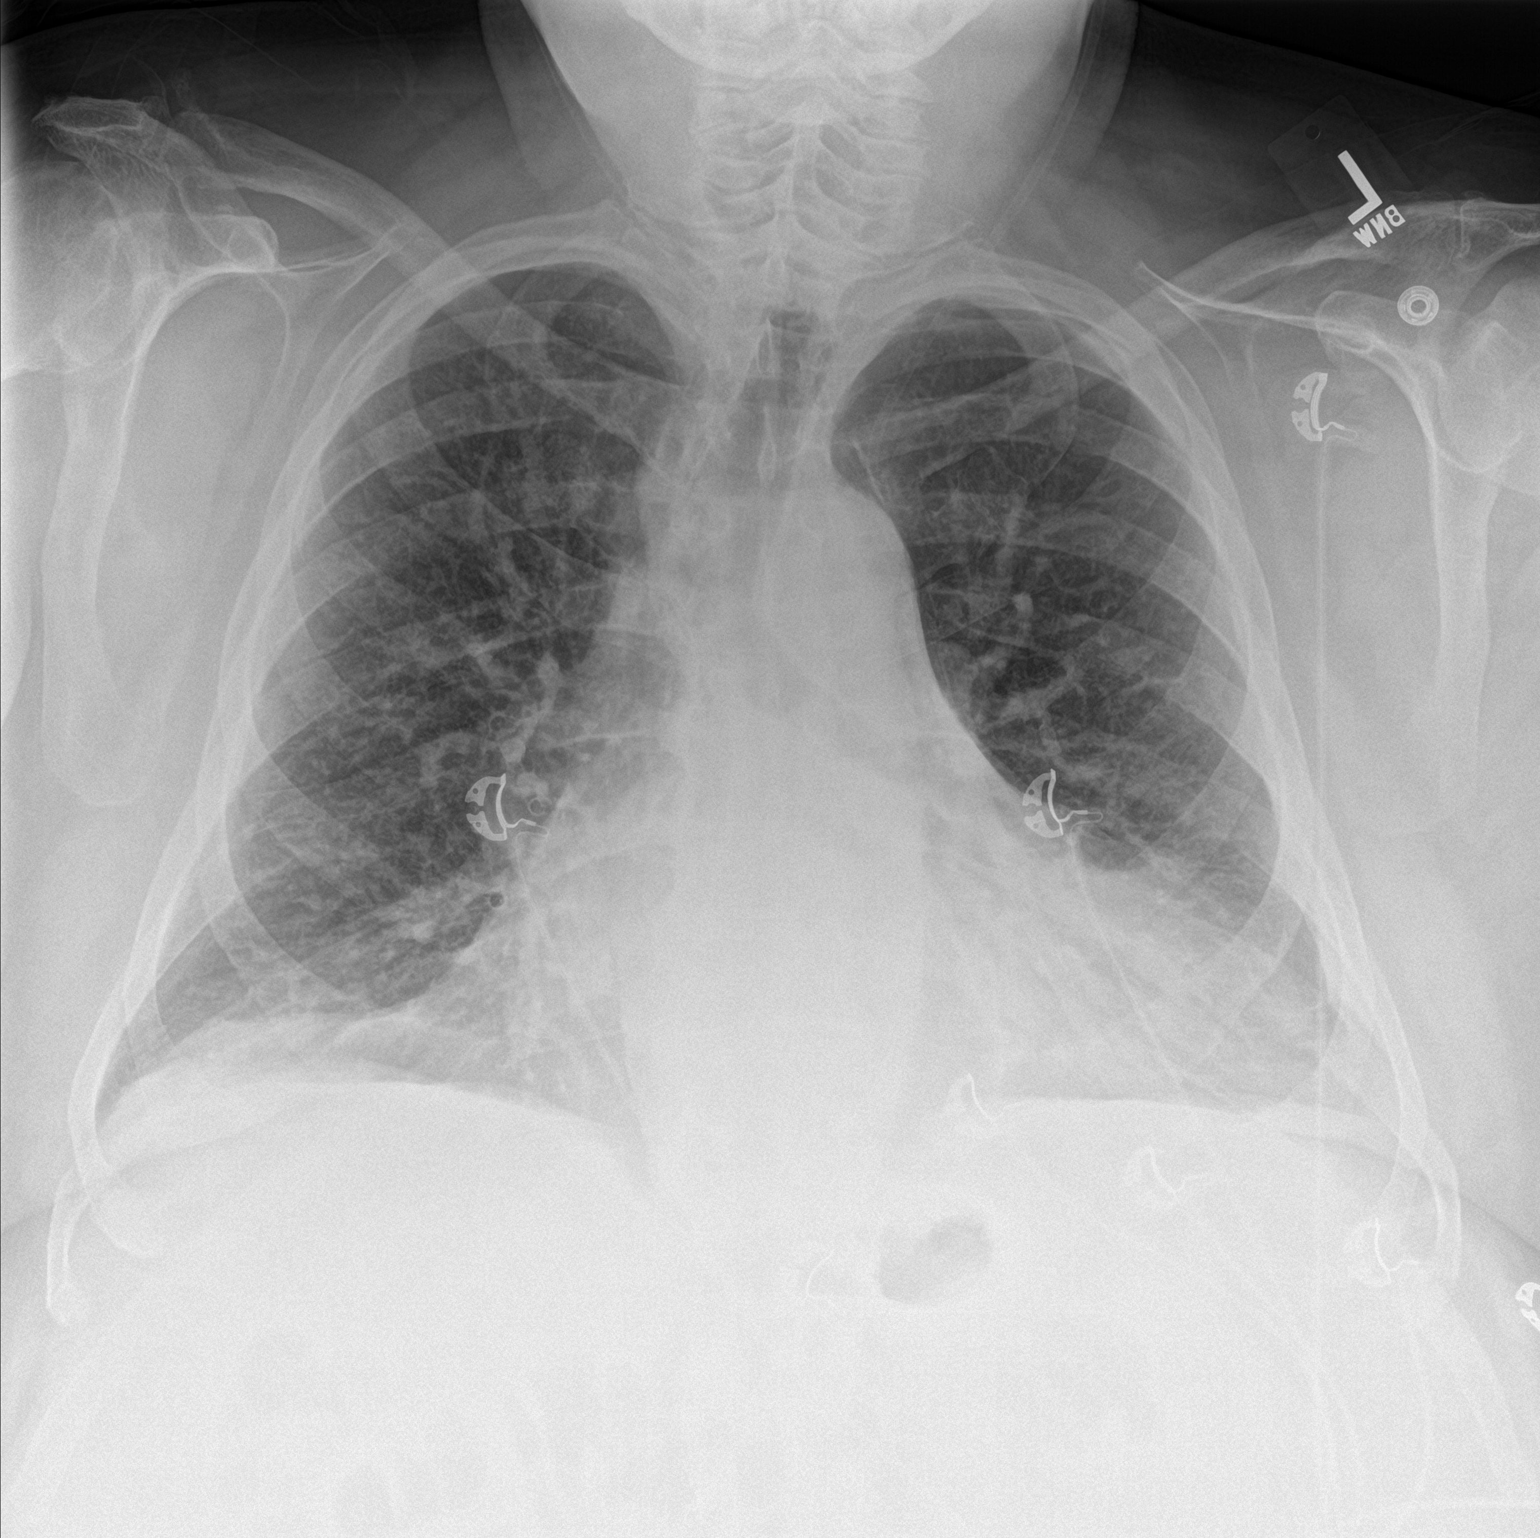

[chest lat]
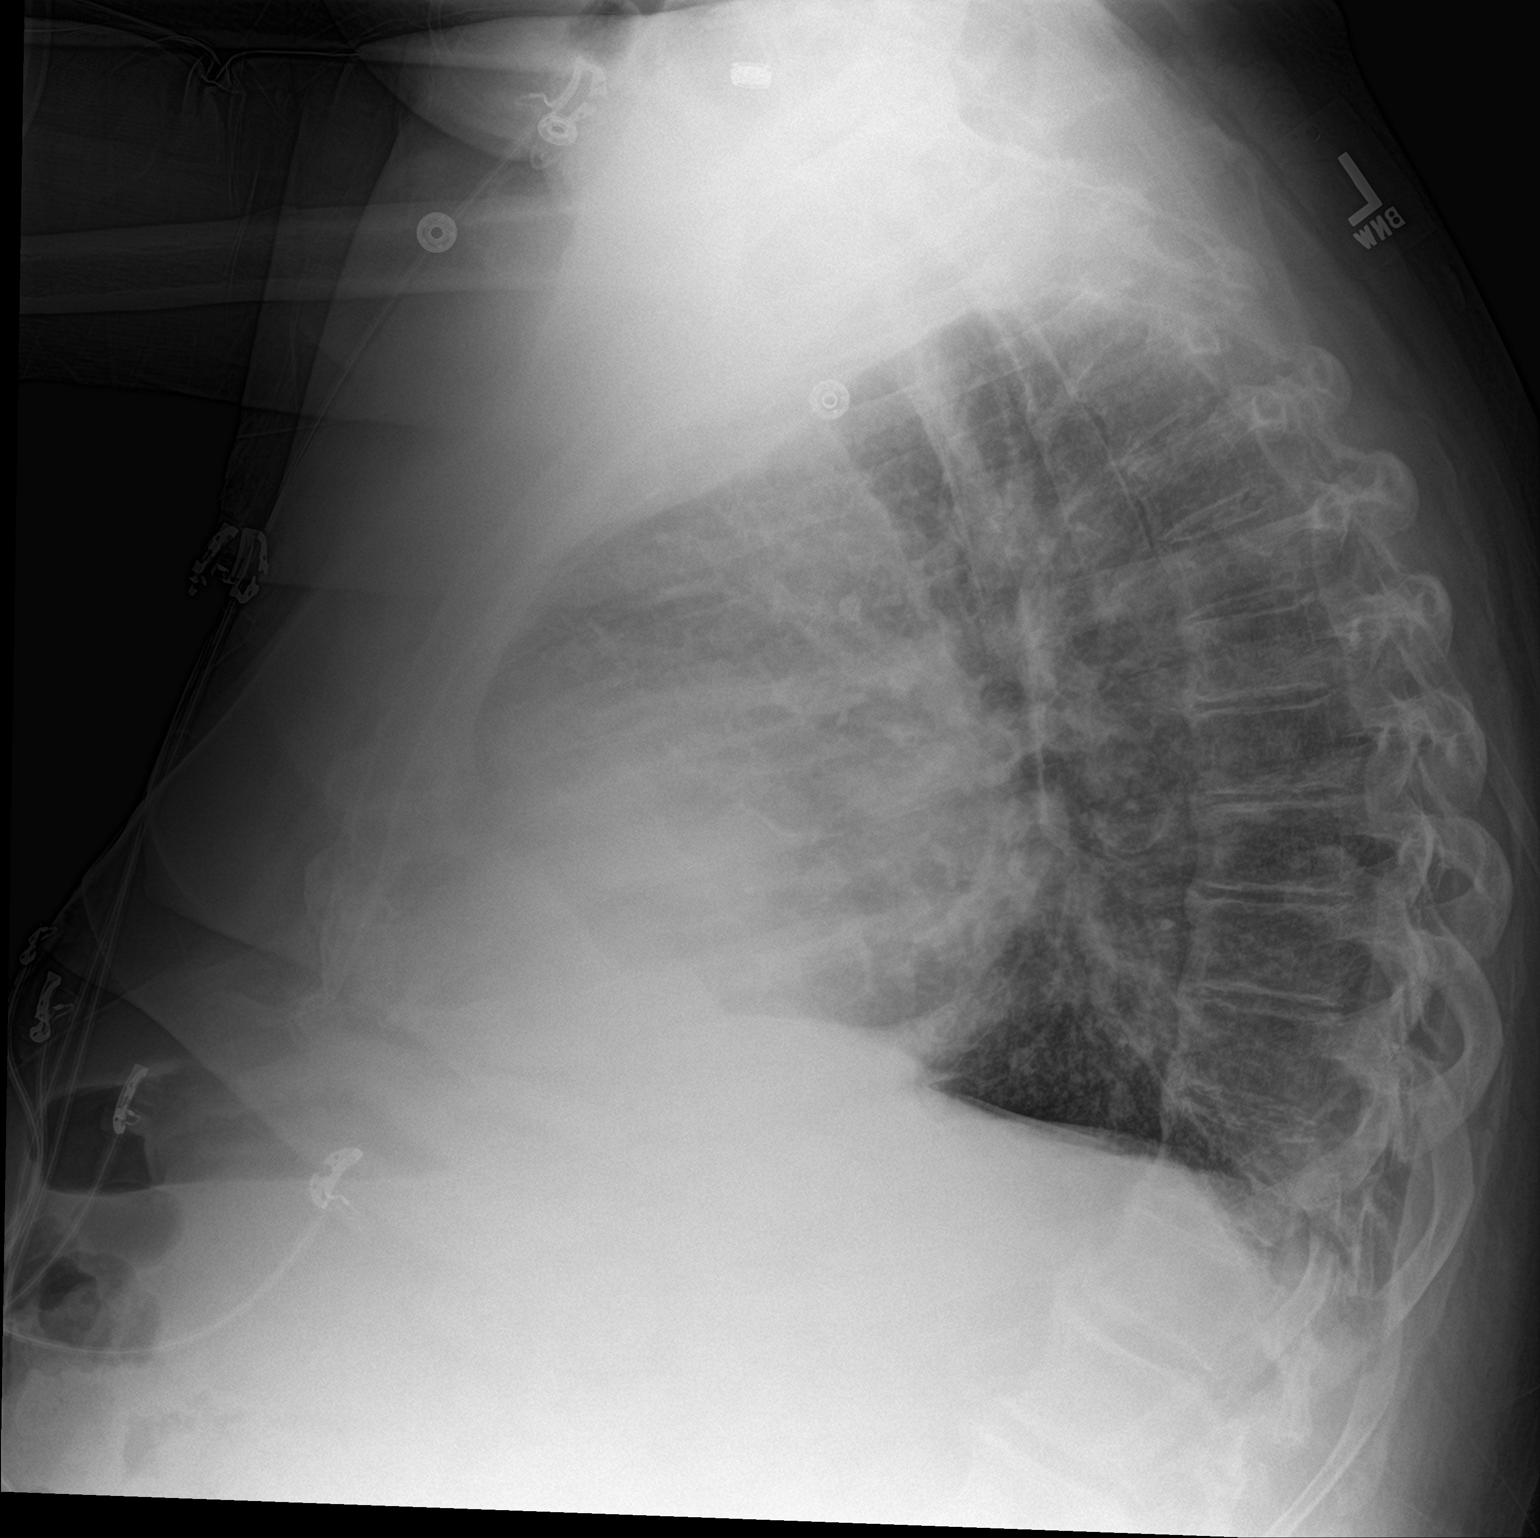

[2 of 2 positions shown; findings below may reference images not displayed]

FINDINGS: Again noted is enlargement of the cardiac silhouette. There is no
evidence for pulmonary edema or focal airspace disease. No large
pleural effusions. Degenerative changes in the thoracic spine. No
acute bone abnormality.
IMPRESSION: No acute chest findings.  No evidence for pulmonary edema.

## 2018-05-01 NOTE — Telephone Encounter (Signed)
Refill done.  

## 2018-05-06 ENCOUNTER — Other Ambulatory Visit: Payer: Self-pay | Admitting: Student

## 2018-05-15 ENCOUNTER — Other Ambulatory Visit: Payer: Self-pay | Admitting: Internal Medicine

## 2018-06-26 ENCOUNTER — Other Ambulatory Visit: Payer: Self-pay | Admitting: Cardiology

## 2018-06-30 ENCOUNTER — Other Ambulatory Visit: Payer: Self-pay | Admitting: Student

## 2018-07-23 ENCOUNTER — Other Ambulatory Visit: Payer: Self-pay | Admitting: Family Medicine

## 2018-07-24 ENCOUNTER — Ambulatory Visit (INDEPENDENT_AMBULATORY_CARE_PROVIDER_SITE_OTHER): Payer: Medicare Other | Admitting: Pulmonary Disease

## 2018-07-24 ENCOUNTER — Encounter: Payer: Self-pay | Admitting: Pulmonary Disease

## 2018-07-24 ENCOUNTER — Other Ambulatory Visit: Payer: Self-pay

## 2018-07-24 VITALS — BP 124/84 | HR 77 | Ht 68.0 in | Wt 264.0 lb

## 2018-07-24 DIAGNOSIS — G4730 Sleep apnea, unspecified: Secondary | ICD-10-CM

## 2018-07-24 DIAGNOSIS — G4733 Obstructive sleep apnea (adult) (pediatric): Secondary | ICD-10-CM | POA: Diagnosis not present

## 2018-07-24 DIAGNOSIS — E662 Morbid (severe) obesity with alveolar hypoventilation: Secondary | ICD-10-CM

## 2018-07-24 DIAGNOSIS — J9611 Chronic respiratory failure with hypoxia: Secondary | ICD-10-CM | POA: Diagnosis not present

## 2018-07-24 DIAGNOSIS — J9612 Chronic respiratory failure with hypercapnia: Secondary | ICD-10-CM | POA: Diagnosis not present

## 2018-07-24 DIAGNOSIS — E669 Obesity, unspecified: Secondary | ICD-10-CM | POA: Diagnosis not present

## 2018-07-24 MED ORDER — VITAMIN D (ERGOCALCIFEROL) 1.25 MG (50000 UNIT) PO CAPS: ORAL_CAPSULE | ORAL | 0 refills | Status: DC

## 2018-07-24 NOTE — Patient Instructions (Signed)
Will arrange for new Bipap machine  Follow up in 2 to 3 months after getting new Bipap machine

## 2018-07-24 NOTE — Progress Notes (Signed)
Pulmonary, Critical Care, and Sleep Medicine  Chief Complaint  Patient presents with  . Follow-up    Pt is doing well on bipap machine overall.    Constitutional:  BP 124/84 (BP Location: Left Arm, Cuff Size: Normal)   Pulse 77   Ht 5\' 8"  (1.727 m)   Wt 264 lb (119.7 kg)   SpO2 96%   BMI 40.14 kg/m   Past Medical History:  HTN, A fib, HLD, DM, Gout, Migraine HA   Brief Summary:  [name] [name] is a 72 y.o. male severe OSA, OHS, and chronic bronchitis with history of tobacco abuse.  Since his last visit he has lost 20 lbs.  He changed his diet and is doing more activity.  He feels better and breathing better.  Not having as much leg swelling.  He is using a Designer, fashion/clothing.  He still is on 4 liters oxygen during the day and 5 liters at night with Bipap.    He is not having cough, wheeze, sputum, chest pain.  Physical Exam:   Appearance - well kempt   ENMT - clear nasal mucosa, midline nasal  septum, no oral exudates, no LAN, trachea midline  Respiratory - normal chest wall, normal respiratory effort, no accessory muscle use, no wheeze/rales  CV - s1s2 regular rate and rhythm, no murmurs, no peripheral edema, radial pulses symmetric  GI - soft, non tender, no masses  Lymph - no adenopathy noted in neck and axillary areas  MSK - normal gait  Ext - no cyanosis, clubbing, or joint inflammation noted  Skin - venous stasis changes  Neuro - normal strength, oriented x 3  Psych - normal mood and affect  Assessment/Plan:   Obstructive sleep apnea. - he failed previous attempt with CPAP - he needs new auto Bipap since his current device is more than 72 yrs old - he is compliant with therapy and reports benefit  Chronic hypoxic/hypercapnic respiratory failure 2nd to obesity hypoventilation syndrome. - goal SpO2 is > 90% - he will monitor SpO2 at home - he has been using 4 liters oxygen during the day - once he is on new auto Bipap will arrange for ONO with 5  liters and then determine if his O2 needs can be decreased at night   Obesity. - encouraged him to continue his weight loss efforts   Patient Instructions  Will arrange for new Bipap machine  Follow up in 2 to 3 months after getting new Bipap machine    Chesley Mires, MD [place] Pager: [phone] 07/24/2018, 11:14 AM  Flow Sheet     Pulmonary tests:  ABG 02/09/08 >> pH 7.37, PCO2 55.1, PO2 76.4 Spirometry 04/03/09 >> FEV1 2.08(69%), FEV1% 77 PFT 02/16/13 >> FEV1 2.06 (76%), FEV1% 85, TLC 1.64 (74%), DLCO 78%, no BD  Sleep tests:  PSG 06/18/08 >> AHI 64, BPAP 18/9 with 4 liters oxygen ONO with BiPAP and 4 liters 01/18/13 >> Test time 8 hrs 55 min. Basal SpO2 91.7%, low SpO2 83%. Spent 32 min with SpO2 < 88%. Auto BiPAP 02/01/13 to 02/14/13 >> Used on 14 of 14 nights with average 9 hrs 58 min.  Average AHI 13 (mostly hypopneas) with median IPAP 12 cm H2O and EPAP 7 cm H2O. BiPAP 11/21/13 to 02/18/14 >> used on 90 of 90 nights with average 8 hrs 48 min. Average AHI 11.7 with median EPAP 9 cm H2O and maximum IPAP 11 cm H2O.  Cardiac tests:  Echo 12/24/15 >>  EF 55 to 60%, severe LA dilation   Review of Systems:    Medications:   Allergies as of 07/24/2018      Reactions   Codeine    Altered mental status "goofy"   Heparin Rash    Abdominal rash 01/2013      Medication List       Accurate as of July 24, 2018 11:14 AM. Always use your most recent med list.        allopurinol 100 MG tablet Commonly known as:  ZYLOPRIM TAKE 1 TABLET DAILY   atorvastatin 20 MG tablet Commonly known as:  LIPITOR TAKE 1 TABLET DAILY   cetirizine 10 MG tablet Commonly known as:  ZYRTEC Take 10 mg by mouth at bedtime.   diltiazem 180 MG 24 hr capsule Commonly known as:  CARDIZEM CD TAKE 1 CAPSULE DAILY. ( NEED OFFICE VISIT )   ELIQUIS 5 MG Tabs tablet Generic drug:  apixaban TAKE 1 TABLET TWICE A DAY   glipiZIDE 10 MG 24 hr tablet Commonly known as:   GLUCOTROL XL TAKE 1 TABLET DAILY WITH BREAKFAST   glucose blood test strip Commonly known as:  ONE TOUCH ULTRA TEST Use to check blood sugars once daily Dx E11.9   Lancets Misc Use as directed once daily  E11.9   metFORMIN 500 MG 24 hr tablet Commonly known as:  GLUCOPHAGE-XR TAKE 2 TABLETS DAILY WITH BREAKFAST   metoprolol tartrate 25 MG tablet Commonly known as:  LOPRESSOR Take 1 tablet (25 mg total) by mouth 2 (two) times daily.   OXYGEN Inhale 3-5 L into the lungs daily. 3L Daily  4L Resting  5L Exertion   potassium chloride SA 20 MEQ tablet Commonly known as:  K-DUR,KLOR-CON TAKE 1 TABLET TWICE A DAY   torsemide 20 MG tablet Commonly known as:  DEMADEX TAKE 2 TABLETS TWICE A DAY (PLEASE CONTACT OFFICE FOR ADDITIONAL REFILLS)   Vitamin D (Ergocalciferol) 1.25 MG (50000 UT) Caps capsule Commonly known as:  DRISDOL Take one capsule every 7 days.       Past Surgical History:  He  has a past surgical history that includes Appendectomy (1974); Tendon repair (Right, 2009); Cardioversion (05/21/2008; ~ 06/2008); Inguinal hernia repair (Right); Hernia repair; Umbilical hernia repair; and right heart catheterization (N/A, 01/08/2013).  Family History:  His family history includes Alzheimer's disease in his father; Cancer in an other family member; Coronary artery disease in an other family member; Prostate cancer in his father.  Social History:  He  reports that he quit smoking about 39 years ago. His smoking use included cigarettes. He has a 15.00 pack-year smoking history. He has never used smokeless tobacco. He reports current alcohol use. He reports that he does not use drugs.

## 2018-07-25 ENCOUNTER — Telehealth: Payer: Self-pay | Admitting: Pulmonary Disease

## 2018-07-25 NOTE — Telephone Encounter (Signed)
Dr. Halford Chessman, please advise on this for [facility]. Thanks!

## 2018-07-26 NOTE — Telephone Encounter (Signed)
I do not know what sleep study [facility] is referring to.  I have no information about him doing a sleep study recently.

## 2018-07-26 NOTE — Telephone Encounter (Signed)
Spoke with Barbaraann Rondo at [facility]  For some reason they can see the PSG that pt had at [facility], but we can not?? He is going to fax it to triage and will then get to Dr Halford Chessman for interpretation Will await the fax

## 2018-07-27 NOTE — Telephone Encounter (Signed)
Fax was received from [facility] yesterday, 12/18 and was placed in VS's box up front. Nothing further needed.

## 2018-08-07 NOTE — Progress Notes (Deleted)
HPI: FU permanent AFib and right side CHF. He has failed DCCV in the past. Myoview 7/09: EF 54%, normal perfusion. Holter 09/2010: AFib with mildly decreased rate. Right heart failure felt secondary to pulmonary hypertension which is multifactorial including OSA, OHS and possible pulmonary venous hypertension. [place] 01/08/13 demonstrated elevated R heart pressures (RA 15, RV 44/11, PCWP 21, PA 41/30, CO 4.49, CI 1.8, PA sat 61%). Patient has had some degree of renal insufficiency and hyperkalemia on a combination of ACE inhibitor and spironolactone. Last echo 5/17 showed EF 55-60, biatrial enlargement, small pericardial effusion. Since last seen,   Current Outpatient Medications  Medication Sig Dispense Refill  . allopurinol (ZYLOPRIM) 100 MG tablet TAKE 1 TABLET DAILY 90 tablet 1  . atorvastatin (LIPITOR) 20 MG tablet TAKE 1 TABLET DAILY 90 tablet 2  . cetirizine (ZYRTEC) 10 MG tablet Take 10 mg by mouth at bedtime.    Marland Kitchen diltiazem (CARDIZEM CD) 180 MG 24 hr capsule TAKE 1 CAPSULE DAILY. ( NEED OFFICE VISIT ) 90 capsule 3  . ELIQUIS 5 MG TABS tablet TAKE 1 TABLET TWICE A DAY 180 tablet 1  . glipiZIDE (GLUCOTROL XL) 10 MG 24 hr tablet TAKE 1 TABLET DAILY WITH BREAKFAST 90 tablet 1  . glucose blood (ONE TOUCH ULTRA TEST) test strip Use to check blood sugars once daily Dx E11.9 100 each 2  . Lancets MISC Use as directed once daily  E11.9 100 each 12  . metFORMIN (GLUCOPHAGE-XR) 500 MG 24 hr tablet TAKE 2 TABLETS DAILY WITH BREAKFAST 180 tablet 4  . metoprolol tartrate (LOPRESSOR) 25 MG tablet Take 1 tablet (25 mg total) by mouth 2 (two) times daily. 180 tablet 3  . OXYGEN Inhale 3-5 L into the lungs daily. 3L Daily  4L Resting  5L Exertion    . potassium chloride SA (K-DUR,KLOR-CON) 20 MEQ tablet TAKE 1 TABLET TWICE A DAY 180 tablet 1  . torsemide (DEMADEX) 20 MG tablet TAKE 2 TABLETS TWICE A DAY (PLEASE CONTACT OFFICE FOR ADDITIONAL REFILLS) 360 tablet 2  . Vitamin D, Ergocalciferol, (DRISDOL)  1.25 MG (50000 UT) CAPS capsule Take one capsule every 7 days. 12 capsule 0   No current facility-administered medications for this visit.      Past Medical History:  Diagnosis Date  . Acute right-sided CHF (congestive heart failure) ([place])    a. 01/2013.  . Arthritis    "knees and hands; thoracic area of the spine" (01/05/2013)  . BRONCHITIS, CHRONIC 01/02/2009  . COMMON MIGRAINE    "none since treating for high BP"  . Complication of anesthesia    "aspiration pneumonia after hand OR" (01/05/2013)  . DIABETES MELLITUS, TYPE II   . GOUT   . HYPERLIPIDEMIA   . HYPERTENSION   . Long term (current) use of anticoagulants   . Morbid obesity ([place])   . OBESITY HYPOVENTILATION SYNDROME    a. on home O2.  . On home oxygen therapy   . OSA (obstructive sleep apnea)   . Permanent atrial fibrillation   . PROSTATE SPECIFIC ANTIGEN, ELEVATED 06/09/2007  . Pulmonary HTN ([place])    a. multifactorial including obstructive sleep apnea, obesity hypoventilation syndrome and possible pulmonary venous hypertension.  Marland Kitchen SKIN RASH 03/12/2010  . Unspecified hearing loss     Past Surgical History:  Procedure Laterality Date  . APPENDECTOMY  1974  . CARDIOVERSION  05/21/2008; ~ 06/2008  . HERNIA REPAIR    . INGUINAL HERNIA REPAIR Right   . RIGHT  HEART CATHETERIZATION N/A 01/08/2013   Procedure: RIGHT HEART CATH;  Surgeon: Thayer Headings, MD;  Location: [facility] CATH LAB;  Service: Cardiovascular;  Laterality: N/A;  . TENDON REPAIR Right 2009   "lacerated his tendon and pulley" Dr. Lenon Curt  . UMBILICAL HERNIA REPAIR      Social History   Socioeconomic History  . Marital status: Married    Spouse name: Not on file  . Number of children: 2  . Years of education: Not on file  . Highest education level: Not on file  Occupational History  . Occupation: Quarry manager: [employer]  Social Needs  . Financial resource strain: Not on file  . Food insecurity:    Worry: Not on file     Inability: Not on file  . Transportation needs:    Medical: Not on file    Non-medical: Not on file  Tobacco Use  . Smoking status: Former Smoker    Packs/day: 1.00    Years: 15.00    Pack years: 15.00    Types: Cigarettes    Last attempt to quit: 08/09/1978    Years since quitting: 40.0  . Smokeless tobacco: Never Used  . Tobacco comment: 01/05/2013 "quit smoking ~ 40 years ago"  Substance and Sexual Activity  . Alcohol use: Yes    Alcohol/week: 0.0 standard drinks    Comment: 01/05/2013 "hasn't had a drink since 1980's; never had problem w/it"  . Drug use: No  . Sexual activity: Not Currently  Lifestyle  . Physical activity:    Days per week: Not on file    Minutes per session: Not on file  . Stress: Not on file  Relationships  . Social connections:    Talks on phone: Not on file    Gets together: Not on file    Attends religious service: Not on file    Active member of club or organization: Not on file    Attends meetings of clubs or organizations: Not on file    Relationship status: Not on file  . Intimate partner violence:    Fear of current or ex partner: Not on file    Emotionally abused: Not on file    Physically abused: Not on file    Forced sexual activity: Not on file  Other Topics Concern  . Not on file  Social History Narrative  . Not on file    Family History  Problem Relation Age of Onset  . Alzheimer's disease Father   . Prostate cancer Father   . Cancer Other        Breast Cancer, <50 yo 1st degree relative  . Coronary artery disease Other        Male, 1st degree relative    ROS: no fevers or chills, productive cough, hemoptysis, dysphasia, odynophagia, melena, hematochezia, dysuria, hematuria, rash, seizure activity, orthopnea, PND, pedal edema, claudication. Remaining systems are negative.  Physical Exam: Well-developed well-nourished in no acute distress.  Skin is warm and dry.  HEENT is normal.  Neck is supple.  Chest is clear to  auscultation with normal expansion.  Cardiovascular exam is regular rate and rhythm.  Abdominal exam nontender or distended. No masses palpated. Extremities show no edema. neuro grossly intact  ECG- personally reviewed  A/P  1  Kirk Ruths, MD

## 2018-08-10 ENCOUNTER — Ambulatory Visit: Payer: Medicare Other | Admitting: Cardiology

## 2018-08-17 ENCOUNTER — Other Ambulatory Visit: Payer: Self-pay | Admitting: Family Medicine

## 2018-08-21 ENCOUNTER — Encounter: Payer: Self-pay | Admitting: Cardiology

## 2018-08-22 ENCOUNTER — Encounter: Payer: Self-pay | Admitting: Cardiology

## 2018-08-22 ENCOUNTER — Ambulatory Visit (INDEPENDENT_AMBULATORY_CARE_PROVIDER_SITE_OTHER): Payer: Medicare Other | Admitting: Cardiology

## 2018-08-22 VITALS — BP 130/72 | HR 81 | Ht 68.0 in | Wt 263.2 lb

## 2018-08-22 DIAGNOSIS — I4821 Permanent atrial fibrillation: Secondary | ICD-10-CM

## 2018-08-22 DIAGNOSIS — Z9989 Dependence on other enabling machines and devices: Secondary | ICD-10-CM | POA: Diagnosis not present

## 2018-08-22 DIAGNOSIS — E119 Type 2 diabetes mellitus without complications: Secondary | ICD-10-CM | POA: Diagnosis not present

## 2018-08-22 DIAGNOSIS — Z7901 Long term (current) use of anticoagulants: Secondary | ICD-10-CM | POA: Diagnosis not present

## 2018-08-22 DIAGNOSIS — G4733 Obstructive sleep apnea (adult) (pediatric): Secondary | ICD-10-CM | POA: Diagnosis not present

## 2018-08-22 DIAGNOSIS — E662 Morbid (severe) obesity with alveolar hypoventilation: Secondary | ICD-10-CM | POA: Diagnosis not present

## 2018-08-22 DIAGNOSIS — I10 Essential (primary) hypertension: Secondary | ICD-10-CM | POA: Diagnosis not present

## 2018-08-22 LAB — BASIC METABOLIC PANEL
BUN/Creatinine Ratio: 18 (ref 10–24)
BUN: 19 mg/dL (ref 8–27)
CO2: 27 mmol/L (ref 20–29)
Calcium: 9.4 mg/dL (ref 8.6–10.2)
Chloride: 101 mmol/L (ref 96–106)
Creatinine, Ser: 1.07 mg/dL (ref 0.76–1.27)
GFR calc Af Amer: 80 mL/min/{1.73_m2} (ref >59)
GFR calc non Af Amer: 69 mL/min/{1.73_m2} (ref >59)
Glucose: 172 mg/dL — ABNORMAL HIGH (ref 65–99)
Potassium: 4.1 mmol/L (ref 3.5–5.2)
Sodium: 141 mmol/L (ref 134–144)

## 2018-08-22 NOTE — Patient Instructions (Addendum)
Medication Instructions:   Your physician recommends that you continue on your current medications as directed. Please refer to the Current Medication list given to you today.  If you need a refill on your cardiac medications before your next appointment, please call your pharmacy.   Lab work:  BMET  If you have labs (blood work) drawn today and your tests are completely normal, you will receive your results only by: Marland Kitchen MyChart Message (if you have MyChart) OR . A paper copy in the mail If you have any lab test that is abnormal or we need to change your treatment, we will call you to review the results.  Testing/Procedures:  NONE  Follow-Up: At [facility], you and your health needs are our priority.  As part of our continuing mission to provide you with exceptional heart care, we have created designated Provider Care Teams.  These Care Teams include your primary Cardiologist (physician) and Advanced Practice Providers (APPs -  Physician Assistants and Nurse Practitioners) who all work together to provide you with the care you need, when you need it. You will need a follow up appointment in 6 months (July 2020).  Please call our office 2 months (May 2020) in advance to schedule this appointment.  You may see Kirk Ruths, MD or one of the following Advanced Practice Providers on your designated Care Team:   Kerin Ransom, PA-C Roby Lofts, [place] . Sande Rives, PA-C

## 2018-08-22 NOTE — Progress Notes (Signed)
08/22/2018 [name] [name]   1945/10/09  [id number]  Primary Physician [name] Borg, MD Primary Cardiologist: Dr [name] [name]  HPI: [name] is a pleasant 73 year old male followed by Dr. Stanford [name] with a history of chronic atrial fibrillation, sleep apnea, diastolic heart failure, and obesity hypoventilation syndrome.  He is also followed by Dr. Halford [name].  His last echocardiogram in 2017 showed an ejection fraction of 55 to 60%.  Diastolic dysfunction could not be determined secondary to atrial fibrillation.  He had a severely dilated left atrium and a mildly dilated right atrium.  He is in the office today for routine follow-up.  Since we saw him last he is done well, he denies any hospitalizations or illnesses.  He has managed to lose quite a bit of weight, he is gone from 290 pounds down to 263 pounds.  He saw Dr. Halford [name] in December.  His BiPAP has been updated.  He remains in atrial fibrillation with controlled ventricular response and asymptomatic.   Current Outpatient Medications  Medication Sig Dispense Refill  . allopurinol (ZYLOPRIM) 100 MG tablet TAKE 1 TABLET DAILY 90 tablet 1  . atorvastatin (LIPITOR) 20 MG tablet TAKE 1 TABLET DAILY 90 tablet 2  . cetirizine (ZYRTEC) 10 MG tablet Take 10 mg by mouth at bedtime.    Marland Kitchen diltiazem (CARDIZEM CD) 180 MG 24 hr capsule TAKE 1 CAPSULE DAILY. ( NEED OFFICE VISIT ) 90 capsule 3  . ELIQUIS 5 MG TABS tablet TAKE 1 TABLET TWICE A DAY 180 tablet 1  . glipiZIDE (GLUCOTROL XL) 10 MG 24 hr tablet TAKE 1 TABLET DAILY WITH BREAKFAST 90 tablet 1  . glucose blood (ONE TOUCH ULTRA TEST) test strip Use to check blood sugars once daily Dx E11.9 100 each 2  . Lancets MISC Use as directed once daily  E11.9 100 each 12  . metFORMIN (GLUCOPHAGE-XR) 500 MG 24 hr tablet TAKE 2 TABLETS DAILY WITH BREAKFAST 180 tablet 4  . metoprolol tartrate (LOPRESSOR) 25 MG tablet Take 1 tablet (25 mg total) by mouth 2 (two) times daily. 180 tablet 3  . OXYGEN Inhale 3-5 L into the lungs daily.  3L Daily  4L Resting  5L Exertion    . potassium chloride SA (K-DUR,KLOR-CON) 20 MEQ tablet TAKE 1 TABLET TWICE A DAY 180 tablet 1  . torsemide (DEMADEX) 20 MG tablet TAKE 2 TABLETS TWICE A DAY (PLEASE CONTACT OFFICE FOR ADDITIONAL REFILLS) 360 tablet 2  . Vitamin D, Ergocalciferol, (DRISDOL) 1.25 MG (50000 UT) CAPS capsule Take one capsule every 7 days. 12 capsule 0   No current facility-administered medications for this visit.     Allergies  Allergen Reactions  . Codeine     Altered mental status "goofy"  . Heparin Rash     Abdominal rash 01/2013    Past Medical History:  Diagnosis Date  . Acute right-sided CHF (congestive heart failure) ([place])    a. 01/2013.  . Arthritis    "knees and hands; thoracic area of the spine" (01/05/2013)  . BRONCHITIS, CHRONIC 01/02/2009  . COMMON MIGRAINE    "none since treating for high BP"  . Complication of anesthesia    "aspiration pneumonia after hand OR" (01/05/2013)  . DIABETES MELLITUS, TYPE II   . GOUT   . HYPERLIPIDEMIA   . HYPERTENSION   . Long term (current) use of anticoagulants   . Morbid obesity ([place])   . OBESITY HYPOVENTILATION SYNDROME    a. on home O2.  . On home oxygen  therapy   . OSA (obstructive sleep apnea)   . Permanent atrial fibrillation   . PROSTATE SPECIFIC ANTIGEN, ELEVATED 06/09/2007  . Pulmonary HTN ([place])    a. multifactorial including obstructive sleep apnea, obesity hypoventilation syndrome and possible pulmonary venous hypertension.  Marland Kitchen SKIN RASH 03/12/2010  . Unspecified hearing loss     Social History   Socioeconomic History  . Marital status: Married    Spouse name: Not on file  . Number of children: 2  . Years of education: Not on file  . Highest education level: Not on file  Occupational History  . Occupation: Quarry manager: [employer]  Social Needs  . Financial resource strain: Not on file  . Food insecurity:    Worry: Not on file    Inability: Not on file  .  Transportation needs:    Medical: Not on file    Non-medical: Not on file  Tobacco Use  . Smoking status: Former Smoker    Packs/day: 1.00    Years: 15.00    Pack years: 15.00    Types: Cigarettes    Last attempt to quit: 08/09/1978    Years since quitting: 40.0  . Smokeless tobacco: Never Used  . Tobacco comment: 01/05/2013 "quit smoking ~ 40 years ago"  Substance and Sexual Activity  . Alcohol use: Yes    Alcohol/week: 0.0 standard drinks    Comment: 01/05/2013 "hasn't had a drink since 1980's; never had problem w/it"  . Drug use: No  . Sexual activity: Not Currently  Lifestyle  . Physical activity:    Days per week: Not on file    Minutes per session: Not on file  . Stress: Not on file  Relationships  . Social connections:    Talks on phone: Not on file    Gets together: Not on file    Attends religious service: Not on file    Active member of club or organization: Not on file    Attends meetings of clubs or organizations: Not on file    Relationship status: Not on file  . Intimate partner violence:    Fear of current or ex partner: Not on file    Emotionally abused: Not on file    Physically abused: Not on file    Forced sexual activity: Not on file  Other Topics Concern  . Not on file  Social History Narrative  . Not on file     Family History  Problem Relation Age of Onset  . Alzheimer's disease Father   . Prostate cancer Father   . Cancer Other        Breast Cancer, <50 yo 1st degree relative  . Coronary artery disease Other        Male, 1st degree relative     Review of Systems: General: negative for chills, fever, night sweats or weight changes.  Cardiovascular: negative for chest pain,orthopnea, palpitations, paroxysmal nocturnal dyspnea Dermatological: negative for rash Respiratory: negative for cough or wheezing Urologic: negative for hematuria Abdominal: negative for nausea, vomiting, diarrhea, bright red blood per rectum, melena, or  hematemesis Neurologic: negative for visual changes, syncope, or dizziness All other systems reviewed and are otherwise negative except as noted above.    Blood pressure 130/72, pulse 81, height 5\' 8"  (1.727 m), weight 263 lb 3.2 oz (119.4 kg).  General appearance: alert, cooperative, no distress, morbidly obese and on O2 Lungs: clear to auscultation bilaterally Heart: irregularly irregular rhythm Extremities: 1+ chronic  edema with chronic skin changes Neurologic: Grossly normal  EKG AF with VR 80  ASSESSMENT AND PLAN:   CAF-  Rate controlled, asymptomatic  Chronic Hypoxic respiratory failure- Multifactorial- Rt heart failure, obesity, chronic O2  Morbid obesity- He has lost 27 lbs since LOV  NIDDM- Per PCP- on oral agents  Chronic anticoagulation- Eliquis- CHADS VASC=4   PLAN  Same Rx- check BMP on chronic diuretics.  F/U 6 months  Kerin Ransom PA-C 08/22/2018 9:08 AM

## 2018-08-24 ENCOUNTER — Other Ambulatory Visit: Payer: Self-pay | Admitting: Internal Medicine

## 2018-09-26 ENCOUNTER — Ambulatory Visit (INDEPENDENT_AMBULATORY_CARE_PROVIDER_SITE_OTHER): Payer: Medicare Other | Admitting: Primary Care

## 2018-09-26 ENCOUNTER — Ambulatory Visit: Payer: Medicare Other | Admitting: Pulmonary Disease

## 2018-09-26 ENCOUNTER — Encounter: Payer: Self-pay | Admitting: Primary Care

## 2018-09-26 VITALS — BP 134/76 | HR 83 | Ht 68.0 in | Wt 261.4 lb

## 2018-09-26 DIAGNOSIS — Z9989 Dependence on other enabling machines and devices: Secondary | ICD-10-CM | POA: Diagnosis not present

## 2018-09-26 DIAGNOSIS — G4733 Obstructive sleep apnea (adult) (pediatric): Secondary | ICD-10-CM

## 2018-09-26 DIAGNOSIS — E662 Morbid (severe) obesity with alveolar hypoventilation: Secondary | ICD-10-CM

## 2018-09-26 NOTE — Progress Notes (Signed)
@Patient  ID: [name] [name], male    DOB: June 18, 1946, 73 y.o.   MRN: [id number]  Chief Complaint  Patient presents with  . Follow-up    using BiPAP, no problems    Referring provider: [name], MD  HPI: 73 year old male, former smoker. PMH OSA, obesity hypoventilation syndrome, allergic rhinitis. Patient of Dr. Halford Chessman, last seen on 07/24/18. PSG 06/18/08 showed AHI 64.  Needs new auto BIPAP machine. Once he receives needs ONO on 5L to determine if his O2 needs can be decreased at night.   09/26/2018 Patient presents today for regular follow up visit. Accompanied by his wife. No new issues. He received new BIPAP machine 6 weeks ago. States that it is working well and he has had no issues. Continues wearing 4L oxygen during the day and at night. Needs repeat ONO. Continues working on weight loss, states that he has been more active and is looking forward to nicer weather when he can walk outside. Denies cough, wheeze or sob.   Airview download: Usage- 43/43 days; 100% >4hours Mode- Vauto  Pressure- [place] IPAP 20, min EPAP 8, support 4 cm H20 AHI- 3.5  Allergies  Allergen Reactions  . Codeine     Altered mental status "goofy"  . Heparin Rash     Abdominal rash 01/2013    Immunization History  Administered Date(s) Administered  . H1N1 07/15/2008  . Influenza Split 05/17/2011, 06/09/2012  . Influenza Whole 05/09/2008, 06/24/2009, 06/26/2010  . Influenza, High Dose Seasonal PF 04/15/2015, 04/19/2017, 04/25/2018  . Influenza,inj,Quad PF,6+ Mos 04/05/2013, 04/11/2014, 04/13/2016  . Pneumococcal Conjugate-13 06/01/2013, 10/09/2013  . Pneumococcal Polysaccharide-23 02/07/2008, 10/19/2017  . Td 02/07/2008  . Tdap 04/25/2018  . Zoster 07/02/2008    Past Medical History:  Diagnosis Date  . Acute right-sided CHF (congestive heart failure) ([place])    a. 01/2013.  . Arthritis    "knees and hands; thoracic area of the spine" (01/05/2013)  . BRONCHITIS, CHRONIC 01/02/2009  . COMMON MIGRAINE      "none since treating for high BP"  . Complication of anesthesia    "aspiration pneumonia after hand OR" (01/05/2013)  . DIABETES MELLITUS, TYPE II   . GOUT   . HYPERLIPIDEMIA   . HYPERTENSION   . Long term (current) use of anticoagulants   . Morbid obesity ([place])   . OBESITY HYPOVENTILATION SYNDROME    a. on home O2.  . On home oxygen therapy   . OSA (obstructive sleep apnea)   . Permanent atrial fibrillation   . PROSTATE SPECIFIC ANTIGEN, ELEVATED 06/09/2007  . Pulmonary HTN ([place])    a. multifactorial including obstructive sleep apnea, obesity hypoventilation syndrome and possible pulmonary venous hypertension.  Marland Kitchen SKIN RASH 03/12/2010  . Unspecified hearing loss     Tobacco History: Social History   Tobacco Use  Smoking Status Former Smoker  . Packs/day: 1.00  . Years: 15.00  . Pack years: 15.00  . Types: Cigarettes  . Last attempt to quit: 08/09/1978  . Years since quitting: 40.1  Smokeless Tobacco Never Used  Tobacco Comment   01/05/2013 "quit smoking ~ 40 years ago"   Counseling given: Not Answered Comment: 01/05/2013 "quit smoking ~ 40 years ago"   Outpatient Medications Prior to Visit  Medication Sig Dispense Refill  . allopurinol (ZYLOPRIM) 100 MG tablet TAKE 1 TABLET DAILY 90 tablet 1  . atorvastatin (LIPITOR) 20 MG tablet TAKE 1 TABLET DAILY 90 tablet 2  . cetirizine (ZYRTEC) 10 MG tablet Take 10  mg by mouth at bedtime.    Marland Kitchen diltiazem (CARDIZEM CD) 180 MG 24 hr capsule TAKE 1 CAPSULE DAILY. ( NEED OFFICE VISIT ) 90 capsule 3  . ELIQUIS 5 MG TABS tablet TAKE 1 TABLET TWICE A DAY 180 tablet 1  . glipiZIDE (GLUCOTROL XL) 10 MG 24 hr tablet TAKE 1 TABLET DAILY WITH BREAKFAST 90 tablet 1  . glucose blood (ONE TOUCH ULTRA TEST) test strip Use to check blood sugars once daily Dx E11.9 100 each 2  . Lancets MISC Use as directed once daily  E11.9 100 each 12  . metFORMIN (GLUCOPHAGE-XR) 500 MG 24 hr tablet TAKE 2 TABLETS DAILY WITH BREAKFAST 180 tablet 4  . metoprolol  tartrate (LOPRESSOR) 25 MG tablet Take 1 tablet (25 mg total) by mouth 2 (two) times daily. 180 tablet 3  . OXYGEN Inhale 3-5 L into the lungs daily. 3L Daily  4L Resting  5L Exertion    . potassium chloride SA (K-DUR,KLOR-CON) 20 MEQ tablet TAKE 1 TABLET TWICE A DAY 180 tablet 1  . torsemide (DEMADEX) 20 MG tablet TAKE 2 TABLETS TWICE A DAY (PLEASE CONTACT OFFICE FOR ADDITIONAL REFILLS) 360 tablet 2  . Vitamin D, Ergocalciferol, (DRISDOL) 1.25 MG (50000 UT) CAPS capsule Take one capsule every 7 days. 12 capsule 0   No facility-administered medications prior to visit.     Review of Systems  Review of Systems  Constitutional: Negative.   HENT: Negative.   Respiratory: Negative for cough, shortness of breath and wheezing.   Cardiovascular: Negative.   Psychiatric/Behavioral: Negative.     Physical Exam  BP 134/76 (BP Location: Right Arm, Cuff Size: Normal)   Pulse 83   Ht 5\' 8"  (1.727 m)   Wt 261 lb 6.4 oz (118.6 kg)   SpO2 93%   BMI 39.75 kg/m  Physical Exam Constitutional:      Appearance: Normal appearance. He is not ill-appearing.  HENT:     Head: Normocephalic and atraumatic.     Right Ear: Tympanic membrane normal.     Left Ear: Tympanic membrane normal.     Mouth/Throat:     Mouth: Mucous membranes are moist.     Pharynx: Oropharynx is clear.  Eyes:     Extraocular Movements: Extraocular movements intact.     Pupils: Pupils are equal, round, and reactive to light.  Neck:     Musculoskeletal: Normal range of motion and neck supple.  Cardiovascular:     Rate and Rhythm: Normal rate and regular rhythm.  Pulmonary:     Effort: Pulmonary effort is normal. No respiratory distress.     Breath sounds: Normal breath sounds. No wheezing or rhonchi.     Comments: CTA, O2 sat 93% 4L Musculoskeletal: Normal range of motion.  Skin:    General: Skin is warm and dry.  Neurological:     General: No focal deficit present.     Mental Status: He is alert and oriented to  person, place, and time. Mental status is at baseline.  Psychiatric:        Mood and Affect: Mood normal.        Behavior: Behavior normal.        Thought Content: Thought content normal.        Judgment: Judgment normal.      Lab Results:  CBC    Component Value Date/Time   WBC 10.8 (H) 04/25/2018 1031   RBC 5.25 04/25/2018 1031   HGB 15.2 04/25/2018 1031   HCT  46.1 04/25/2018 1031   PLT 262.0 04/25/2018 1031   MCV 87.8 04/25/2018 1031   MCH 28.4 12/23/2015 1531   MCHC 33.0 04/25/2018 1031   RDW 15.3 04/25/2018 1031   LYMPHSABS 2.5 04/25/2018 1031   MONOABS 0.9 04/25/2018 1031   EOSABS 0.2 04/25/2018 1031   BASOSABS 0.1 04/25/2018 1031    BMET    Component Value Date/Time   NA 141 08/22/2018 0949   K 4.1 08/22/2018 0949   CL 101 08/22/2018 0949   CO2 27 08/22/2018 0949   GLUCOSE 172 (H) 08/22/2018 0949   GLUCOSE 82 04/25/2018 1031   BUN 19 08/22/2018 0949   CREATININE 1.07 08/22/2018 0949   CREATININE 0.92 02/09/2016 0850   CALCIUM 9.4 08/22/2018 0949   GFRNONAA 69 08/22/2018 0949   GFRNONAA 56 (L) 04/01/2015 1018   GFRAA 80 08/22/2018 0949   GFRAA 64 04/01/2015 1018    BNP    Component Value Date/Time   BNP 197.0 (H) 12/23/2015 1531   BNP 103.7 (H) 07/22/2014 1025    ProBNP    Component Value Date/Time   PROBNP 1,119.0 (H) 01/05/2013 2031    Imaging: No results found.   Assessment & Plan:   OSA treated with BiPAP - Received new bipap machine 6 weeks ago, no issues - Airview download shows 100% compliance >4hrs, AHI 3.5  - Needs ONO on 4L - FU in 6 months with Dr. Halford Chessman  Obesity hypoventilation syndrome ([facility]) - Continues 4L oxygen during the day and at night with BIPAP  Severe obesity (BMI >= 40) - Continue to encourage weight loss  - Patient is working on becoming more active  - Review diet and exercise recommendations    Martyn Ehrich, NP 09/26/2018

## 2018-09-26 NOTE — Patient Instructions (Signed)
GREAT job using your CPAP! Keep it up   Orders: Overnight oximetry on BIPAP 4L  Recommendations: Continue to work on weight loss and staying active  Follow-up: 6 months with Dr. Halford Chessman or sooner if needed    Calorie Counting for Weight Loss Calories are units of energy. Your body needs a certain amount of calories from food to keep you going throughout the day. When you eat more calories than your body needs, your body stores the extra calories as fat. When you eat fewer calories than your body needs, your body burns fat to get the energy it needs. Calorie counting means keeping track of how many calories you eat and drink each day. Calorie counting can be helpful if you need to lose weight. If you make sure to eat fewer calories than your body needs, you should lose weight. Ask your health care provider what a healthy weight is for you. For calorie counting to work, you will need to eat the right number of calories in a day in order to lose a healthy amount of weight per week. A dietitian can help you determine how many calories you need in a day and will give you suggestions on how to reach your calorie goal.  A healthy amount of weight to lose per week is usually 1-2 lb (0.5-0.9 kg). This usually means that your daily calorie intake should be reduced by 500-750 calories.  Eating 1,200 - 1,500 calories per day can help most women lose weight.  Eating 1,500 - 1,800 calories per day can help most men lose weight. What is my plan? My goal is to have __________ calories per day. If I have this many calories per day, I should lose around __________ pounds per week. What do I need to know about calorie counting? In order to meet your daily calorie goal, you will need to:  Find out how many calories are in each food you would like to eat. Try to do this before you eat.  Decide how much of the food you plan to eat.  Write down what you ate and how many calories it had. Doing this is called  keeping a food log. To successfully lose weight, it is important to balance calorie counting with a healthy lifestyle that includes regular activity. Aim for 150 minutes of moderate exercise (such as walking) or 75 minutes of vigorous exercise (such as running) each week. Where do I find calorie information?  The number of calories in a food can be found on a Nutrition Facts label. If a food does not have a Nutrition Facts label, try to look up the calories online or ask your dietitian for help. Remember that calories are listed per serving. If you choose to have more than one serving of a food, you will have to multiply the calories per serving by the amount of servings you plan to eat. For example, the label on a package of bread might say that a serving size is 1 slice and that there are 90 calories in a serving. If you eat 1 slice, you will have eaten 90 calories. If you eat 2 slices, you will have eaten 180 calories. How do I keep a food log? Immediately after each meal, record the following information in your food log:  What you ate. Don't forget to include toppings, sauces, and other extras on the food.  How much you ate. This can be measured in cups, ounces, or number of items.  How many  calories each food and drink had.  The total number of calories in the meal. Keep your food log near you, such as in a small notebook in your pocket, or use a mobile app or website. Some programs will calculate calories for you and show you how many calories you have left for the day to meet your goal. What are some calorie counting tips?   Use your calories on foods and drinks that will fill you up and not leave you hungry: ? Some examples of foods that fill you up are nuts and nut butters, vegetables, lean proteins, and high-fiber foods like whole grains. High-fiber foods are foods with more than 5 g fiber per serving. ? Drinks such as sodas, specialty coffee drinks, alcohol, and juices have a lot of  calories, yet do not fill you up.  Eat nutritious foods and avoid empty calories. Empty calories are calories you get from foods or beverages that do not have many vitamins or protein, such as candy, sweets, and soda. It is better to have a nutritious high-calorie food (such as an avocado) than a food with few nutrients (such as a bag of chips).  Know how many calories are in the foods you eat most often. This will help you calculate calorie counts faster.  Pay attention to calories in drinks. Low-calorie drinks include water and unsweetened drinks.  Pay attention to nutrition labels for "low fat" or "fat free" foods. These foods sometimes have the same amount of calories or more calories than the full fat versions. They also often have added sugar, starch, or salt, to make up for flavor that was removed with the fat.  Find a way of tracking calories that works for you. Get creative. Try different apps or programs if writing down calories does not work for you. What are some portion control tips?  Know how many calories are in a serving. This will help you know how many servings of a certain food you can have.  Use a measuring cup to measure serving sizes. You could also try weighing out portions on a kitchen scale. With time, you will be able to estimate serving sizes for some foods.  Take some time to put servings of different foods on your favorite plates, bowls, and cups so you know what a serving looks like.  Try not to eat straight from a bag or box. Doing this can lead to overeating. Put the amount you would like to eat in a cup or on a plate to make sure you are eating the right portion.  Use smaller plates, glasses, and bowls to prevent overeating.  Try not to multitask (for example, watch TV or use your computer) while eating. If it is time to eat, sit down at a table and enjoy your food. This will help you to know when you are full. It will also help you to be aware of what you are  eating and how much you are eating. What are tips for following this plan? Reading food labels  Check the calorie count compared to the serving size. The serving size may be smaller than what you are used to eating.  Check the source of the calories. Make sure the food you are eating is high in vitamins and protein and low in saturated and trans fats. Shopping  Read nutrition labels while you shop. This will help you make healthy decisions before you decide to purchase your food.  Make a grocery list and stick  to it. Cooking  Try to cook your favorite foods in a healthier way. For example, try baking instead of frying.  Use low-fat dairy products. Meal planning  Use more fruits and vegetables. Half of your plate should be fruits and vegetables.  Include lean proteins like poultry and fish. How do I count calories when eating out?  Ask for smaller portion sizes.  Consider sharing an entree and sides instead of getting your own entree.  If you get your own entree, eat only half. Ask for a box at the beginning of your meal and put the rest of your entree in it so you are not tempted to eat it.  If calories are listed on the menu, choose the lower calorie options.  Choose dishes that include vegetables, fruits, whole grains, low-fat dairy products, and lean protein.  Choose items that are boiled, broiled, grilled, or steamed. Stay away from items that are buttered, battered, fried, or served with cream sauce. Items labeled "crispy" are usually fried, unless stated otherwise.  Choose water, low-fat milk, unsweetened iced tea, or other drinks without added sugar. If you want an alcoholic beverage, choose a lower calorie option such as a glass of wine or light beer.  Ask for dressings, sauces, and syrups on the side. These are usually high in calories, so you should limit the amount you eat.  If you want a salad, choose a garden salad and ask for grilled meats. Avoid extra toppings  like bacon, cheese, or fried items. Ask for the dressing on the side, or ask for olive oil and vinegar or lemon to use as dressing.  Estimate how many servings of a food you are given. For example, a serving of cooked rice is  cup or about the size of half a baseball. Knowing serving sizes will help you be aware of how much food you are eating at restaurants. The list below tells you how big or small some common portion sizes are based on everyday objects: ? 1 oz-4 stacked dice. ? 3 oz-1 deck of cards. ? 1 tsp-1 die. ? 1 Tbsp- a ping-pong ball. ? 2 Tbsp-1 ping-pong ball. ?  cup- baseball. ? 1 cup-1 baseball. Summary  Calorie counting means keeping track of how many calories you eat and drink each day. If you eat fewer calories than your body needs, you should lose weight.  A healthy amount of weight to lose per week is usually 1-2 lb (0.5-0.9 kg). This usually means reducing your daily calorie intake by 500-750 calories.  The number of calories in a food can be found on a Nutrition Facts label. If a food does not have a Nutrition Facts label, try to look up the calories online or ask your dietitian for help.  Use your calories on foods and drinks that will fill you up, and not on foods and drinks that will leave you hungry.  Use smaller plates, glasses, and bowls to prevent overeating. This information is not intended to replace advice given to you by your health care provider. Make sure you discuss any questions you have with your health care provider. Document Released: 07/26/2005 Document Revised: 04/14/2018 Document Reviewed: 06/25/2016 Elsevier Interactive Patient Education  2019 [employer].

## 2018-09-26 NOTE — Assessment & Plan Note (Signed)
-   Continue to encourage weight loss  - Patient is working on becoming more active  - Review diet and exercise recommendations

## 2018-09-26 NOTE — Assessment & Plan Note (Addendum)
-   Received new bipap machine 6 weeks ago, no issues - Airview download shows 100% compliance >4hrs, AHI 3.5  - Needs ONO on 4L - FU in 6 months with Dr. Halford Chessman

## 2018-09-26 NOTE — Assessment & Plan Note (Signed)
-   Continues 4L oxygen during the day and at night with BIPAP

## 2018-10-02 NOTE — Progress Notes (Signed)
Reviewed and agree with assessment/plan.   Melodye Swor, MD [facility] 08/04/2016, 12:24 PM Pager:  [phone]  

## 2018-10-05 DIAGNOSIS — J449 Chronic obstructive pulmonary disease, unspecified: Secondary | ICD-10-CM | POA: Diagnosis not present

## 2018-10-05 DIAGNOSIS — R0902 Hypoxemia: Secondary | ICD-10-CM | POA: Diagnosis not present

## 2018-10-23 NOTE — Telephone Encounter (Signed)
Called and spoke with [place], [employer], requested ONO results.  Theadora Rama stated [employer] took over for [facility] 10/08/18, and anything done prior, with [facility], may not have been transferred properly.  Theadora Rama stated that she was going to reach out to others with [employer] to locate ONO. Theadora Rama stated that she will call back.

## 2018-10-24 ENCOUNTER — Telehealth: Payer: Self-pay | Admitting: Primary Care

## 2018-10-24 NOTE — Telephone Encounter (Signed)
Called patient unable to reach LMTCB 

## 2018-10-24 NOTE — Telephone Encounter (Signed)
ONO results showed baseline O2 93%. SpO2 low 86%. He spent 8 mins <88%. I would recommend increasing oxygen to 4.5L. Thank you

## 2018-10-26 ENCOUNTER — Ambulatory Visit: Payer: Medicare Other | Admitting: Internal Medicine

## 2018-11-02 ENCOUNTER — Other Ambulatory Visit: Payer: Self-pay | Admitting: Student

## 2018-11-04 ENCOUNTER — Other Ambulatory Visit: Payer: Self-pay | Admitting: Cardiology

## 2018-11-11 ENCOUNTER — Other Ambulatory Visit: Payer: Self-pay | Admitting: Internal Medicine

## 2018-11-22 ENCOUNTER — Other Ambulatory Visit: Payer: Self-pay | Admitting: Cardiology

## 2018-11-22 NOTE — Telephone Encounter (Signed)
Torsemide 20 mg refilled. 

## 2018-12-19 ENCOUNTER — Encounter: Payer: Self-pay | Admitting: Internal Medicine

## 2018-12-19 ENCOUNTER — Ambulatory Visit (INDEPENDENT_AMBULATORY_CARE_PROVIDER_SITE_OTHER): Payer: Medicare Other | Admitting: Internal Medicine

## 2018-12-19 VITALS — BP 155/79 | HR 71 | Ht 68.0 in | Wt 264.0 lb

## 2018-12-19 DIAGNOSIS — E785 Hyperlipidemia, unspecified: Secondary | ICD-10-CM

## 2018-12-19 DIAGNOSIS — S40861A Insect bite (nonvenomous) of right upper arm, initial encounter: Secondary | ICD-10-CM

## 2018-12-19 DIAGNOSIS — E559 Vitamin D deficiency, unspecified: Secondary | ICD-10-CM | POA: Diagnosis not present

## 2018-12-19 DIAGNOSIS — R972 Elevated prostate specific antigen [PSA]: Secondary | ICD-10-CM | POA: Diagnosis not present

## 2018-12-19 DIAGNOSIS — I10 Essential (primary) hypertension: Secondary | ICD-10-CM

## 2018-12-19 DIAGNOSIS — W57XXXA Bitten or stung by nonvenomous insect and other nonvenomous arthropods, initial encounter: Secondary | ICD-10-CM

## 2018-12-19 DIAGNOSIS — E538 Deficiency of other specified B group vitamins: Secondary | ICD-10-CM | POA: Diagnosis not present

## 2018-12-19 DIAGNOSIS — E119 Type 2 diabetes mellitus without complications: Secondary | ICD-10-CM | POA: Diagnosis not present

## 2018-12-19 MED ORDER — DILTIAZEM HCL ER COATED BEADS 180 MG PO CP24: ORAL_CAPSULE | ORAL | 1 refills | Status: DC

## 2018-12-19 MED ORDER — DOXYCYCLINE HYCLATE 100 MG PO TABS: 100.0000 mg | ORAL_TABLET | Freq: Two times a day (BID) | ORAL | 0 refills | Status: DC

## 2018-12-19 MED ORDER — ZOSTER VAC RECOMB ADJUVANTED 50 MCG/0.5ML IM SUSR: 0.5000 mL | Freq: Once | INTRAMUSCULAR | 1 refills | Status: AC

## 2018-12-19 NOTE — Patient Instructions (Signed)
Your new shingles shot prescription was sent to the pharmacy  Please take all new medication as prescribed  - the antibiotic  Please continue all other medications as before, and refills have been done if requested.  Please have the pharmacy call with any other refills you may need.  Please continue your efforts at being more active, low cholesterol diet, and weight control.  You are otherwise up to date with prevention measures today.  Please keep your appointments with your specialists as you may have planned  Please go to the LAB in the Basement (turn left off the elevator) for the tests to be done today  You will be contacted by phone if any changes need to be made immediately.  Otherwise, you will receive a letter about your results with an explanation, but please check with MyChart first.  Please remember to sign up for MyChart if you have not done so, as this will be important to you in the future with finding out test results, communicating by private email, and scheduling acute appointments online when needed.  Please return in 6 months, or sooner if needed, with Lab testing done 3-5 days before

## 2018-12-19 NOTE — Progress Notes (Signed)
Patient ID: [name] [name], male   DOB: 1945-08-21, 73 y.o.   MRN: [id number]  Virtual Visit via Video Note  I connected with [name] [name] on 12/24/18 at 11:00 AM EDT by a video enabled telemedicine application and verified that I am speaking with the correct person using two identifiers.  Location: Patient: at home with daughter present to assist Provider: at office   I discussed the limitations of evaluation and management by telemedicine and the availability of in person appointments. The patient expressed understanding and agreed to proceed.  History of Present Illness: Here to f/u; overall doing ok,  Pt denies chest pain, increasing sob or doe, wheezing, orthopnea, PND, increased LE swelling, palpitations, dizziness or syncope.  Pt denies new neurological symptoms such as new headache, or facial or extremity weakness or numbness.  Pt denies polydipsia, polyuria, or low sugar episode.  Pt states overall good compliance with meds, mostly trying to follow appropriate diet, with wt overall stable,  but little exercise however. Also with c/o right mid medial upper arm with a tick bite 3 days ago, tick removed, but now red, tender, swelling around it getting worse, not better.  No fever or drainage.  Remains on 4L [place] .  Denies urinary symptoms such as dysuria, frequency, urgency, flank pain, hematuria or n/v, fever, chills.  Past Medical History:  Diagnosis Date  . Acute right-sided CHF (congestive heart failure) ([place])    a. 01/2013.  . Arthritis    "knees and hands; thoracic area of the spine" (01/05/2013)  . BRONCHITIS, CHRONIC 01/02/2009  . COMMON MIGRAINE    "none since treating for high BP"  . Complication of anesthesia    "aspiration pneumonia after hand OR" (01/05/2013)  . DIABETES MELLITUS, TYPE II   . GOUT   . HYPERLIPIDEMIA   . HYPERTENSION   . Long term (current) use of anticoagulants   . Morbid obesity ([place])   . OBESITY HYPOVENTILATION SYNDROME    a. on home O2.  . On home oxygen therapy    . OSA (obstructive sleep apnea)   . Permanent atrial fibrillation   . PROSTATE SPECIFIC ANTIGEN, ELEVATED 06/09/2007  . Pulmonary HTN ([place])    a. multifactorial including obstructive sleep apnea, obesity hypoventilation syndrome and possible pulmonary venous hypertension.  Marland Kitchen SKIN RASH 03/12/2010  . Unspecified hearing loss    Past Surgical History:  Procedure Laterality Date  . APPENDECTOMY  1974  . CARDIOVERSION  05/21/2008; ~ 06/2008  . HERNIA REPAIR    . INGUINAL HERNIA REPAIR Right   . RIGHT HEART CATHETERIZATION N/A 01/08/2013   Procedure: RIGHT HEART CATH;  Surgeon: Thayer Headings, MD;  Location: [facility] CATH LAB;  Service: Cardiovascular;  Laterality: N/A;  . TENDON REPAIR Right 2009   "lacerated his tendon and pulley" Dr. Lenon Curt  . UMBILICAL HERNIA REPAIR      reports that he quit smoking about 40 years ago. His smoking use included cigarettes. He has a 15.00 pack-year smoking history. He has never used smokeless tobacco. He reports current alcohol use. He reports that he does not use drugs. family history includes Alzheimer's disease in his father; Cancer in an other family member; Coronary artery disease in an other family member; Prostate cancer in his father. Allergies  Allergen Reactions  . Codeine     Altered mental status "goofy"  . Heparin Rash     Abdominal rash 01/2013   Current Outpatient Medications on File Prior to Visit  Medication Sig Dispense Refill  .  allopurinol (ZYLOPRIM) 100 MG tablet TAKE 1 TABLET DAILY 90 tablet 1  . atorvastatin (LIPITOR) 20 MG tablet TAKE 1 TABLET DAILY 90 tablet 2  . cetirizine (ZYRTEC) 10 MG tablet Take 10 mg by mouth at bedtime.    Marland Kitchen ELIQUIS 5 MG TABS tablet TAKE 1 TABLET TWICE A DAY 180 tablet 1  . glipiZIDE (GLUCOTROL XL) 10 MG 24 hr tablet TAKE 1 TABLET DAILY WITH BREAKFAST 90 tablet 1  . glucose blood (ONE TOUCH ULTRA TEST) test strip Use to check blood sugars once daily Dx E11.9 100 each 2  . Lancets MISC Use as directed once  daily  E11.9 100 each 12  . metFORMIN (GLUCOPHAGE-XR) 500 MG 24 hr tablet TAKE 2 TABLETS DAILY WITH BREAKFAST 180 tablet 4  . metoprolol tartrate (LOPRESSOR) 25 MG tablet TAKE 1 TABLET TWICE A DAY 180 tablet 2  . OXYGEN Inhale 3-5 L into the lungs daily. 3L Daily  4L Resting  5L Exertion    . potassium chloride SA (K-DUR,KLOR-CON) 20 MEQ tablet TAKE 1 TABLET TWICE A DAY 180 tablet 1  . torsemide (DEMADEX) 20 MG tablet TAKE 2 TABLETS TWICE A DAY (PLEASE CONTACT OFFICE FOR ADDITIONAL REFILLS) 360 tablet 0  . Vitamin D, Ergocalciferol, (DRISDOL) 1.25 MG (50000 UT) CAPS capsule Take one capsule every 7 days. 12 capsule 0   No current facility-administered medications on file prior to visit.    Observations/Objective: Alert, NAD, appropriate mood and affect, resps normal, cn 2-12 intact, moves all 4s, has visible right upper arm mid medial aspect with 2 cm area red, tender, swelling without drainage Lab Results  Component Value Date   WBC 9.7 12/22/2018   HGB 15.2 12/22/2018   HCT 45.7 12/22/2018   PLT 256.0 12/22/2018   GLUCOSE 148 (H) 12/22/2018   CHOL 97 12/22/2018   TRIG 88.0 12/22/2018   HDL 37.40 (L) 12/22/2018   LDLCALC 42 12/22/2018   ALT 16 12/22/2018   AST 14 12/22/2018   NA 144 12/22/2018   K 4.6 12/22/2018   CL 102 12/22/2018   CREATININE 1.08 12/22/2018   BUN 19 12/22/2018   CO2 34 (H) 12/22/2018   TSH 1.38 12/22/2018   PSA 1.28 12/22/2018   INR 1.25 01/09/2013   HGBA1C 6.0 12/22/2018   MICROALBUR 4.3 (H) 12/22/2018   Assessment and Plan: See notes  Follow Up Instructions: See notes  I discussed the assessment and treatment plan with the patient. The patient was provided an opportunity to ask questions and all were answered. The patient agreed with the plan and demonstrated an understanding of the instructions.   The patient was advised to call back or seek an in-person evaluation if the symptoms worsen or if the condition fails to improve as  anticipated.  [name] Cower, MD

## 2018-12-22 ENCOUNTER — Other Ambulatory Visit (INDEPENDENT_AMBULATORY_CARE_PROVIDER_SITE_OTHER): Payer: Medicare Other

## 2018-12-22 DIAGNOSIS — E119 Type 2 diabetes mellitus without complications: Secondary | ICD-10-CM | POA: Diagnosis not present

## 2018-12-22 DIAGNOSIS — S40861A Insect bite (nonvenomous) of right upper arm, initial encounter: Secondary | ICD-10-CM | POA: Diagnosis not present

## 2018-12-22 DIAGNOSIS — R972 Elevated prostate specific antigen [PSA]: Secondary | ICD-10-CM

## 2018-12-22 DIAGNOSIS — E538 Deficiency of other specified B group vitamins: Secondary | ICD-10-CM

## 2018-12-22 DIAGNOSIS — E785 Hyperlipidemia, unspecified: Secondary | ICD-10-CM

## 2018-12-22 DIAGNOSIS — I10 Essential (primary) hypertension: Secondary | ICD-10-CM

## 2018-12-22 DIAGNOSIS — E559 Vitamin D deficiency, unspecified: Secondary | ICD-10-CM

## 2018-12-22 DIAGNOSIS — W57XXXA Bitten or stung by nonvenomous insect and other nonvenomous arthropods, initial encounter: Secondary | ICD-10-CM | POA: Diagnosis not present

## 2018-12-22 LAB — CBC WITH DIFFERENTIAL/PLATELET
Basophils Absolute: 0.1 10*3/uL (ref 0.0–0.1)
Basophils Relative: 0.7 % (ref 0.0–3.0)
Eosinophils Absolute: 0.5 10*3/uL (ref 0.0–0.7)
Eosinophils Relative: 5.2 % — ABNORMAL HIGH (ref 0.0–5.0)
HCT: 45.7 % (ref 39.0–52.0)
Hemoglobin: 15.2 g/dL (ref 13.0–17.0)
Lymphocytes Relative: 21.7 % (ref 12.0–46.0)
Lymphs Abs: 2.1 10*3/uL (ref 0.7–4.0)
MCHC: 33.3 g/dL (ref 30.0–36.0)
MCV: 87.6 fl (ref 78.0–100.0)
Monocytes Absolute: 0.7 10*3/uL (ref 0.1–1.0)
Monocytes Relative: 7.2 % (ref 3.0–12.0)
Neutro Abs: 6.4 10*3/uL (ref 1.4–7.7)
Neutrophils Relative %: 65.2 % (ref 43.0–77.0)
Platelets: 256 10*3/uL (ref 150.0–400.0)
RBC: 5.21 Mil/uL (ref 4.22–5.81)
RDW: 15.5 % (ref 11.5–15.5)
WBC: 9.7 10*3/uL (ref 4.0–10.5)

## 2018-12-22 LAB — BASIC METABOLIC PANEL
BUN: 19 mg/dL (ref 6–23)
CO2: 34 mEq/L — ABNORMAL HIGH (ref 19–32)
Calcium: 9.5 mg/dL (ref 8.4–10.5)
Chloride: 102 mEq/L (ref 96–112)
Creatinine, Ser: 1.08 mg/dL (ref 0.40–1.50)
GFR: 67.01 mL/min (ref >60.00)
Glucose, Bld: 148 mg/dL — ABNORMAL HIGH (ref 70–99)
Potassium: 4.6 mEq/L (ref 3.5–5.1)
Sodium: 144 mEq/L (ref 135–145)

## 2018-12-22 LAB — HEPATIC FUNCTION PANEL
ALT: 16 U/L (ref 0–53)
AST: 14 U/L (ref 0–37)
Albumin: 3.8 g/dL (ref 3.5–5.2)
Alkaline Phosphatase: 72 U/L (ref 39–117)
Bilirubin, Direct: 0.3 mg/dL (ref 0.0–0.3)
Total Bilirubin: 1.2 mg/dL (ref 0.2–1.2)
Total Protein: 7.2 g/dL (ref 6.0–8.3)

## 2018-12-22 LAB — URINALYSIS, ROUTINE W REFLEX MICROSCOPIC
Bilirubin Urine: NEGATIVE
Hgb urine dipstick: NEGATIVE
Ketones, ur: NEGATIVE
Leukocytes,Ua: NEGATIVE
Nitrite: NEGATIVE
RBC / HPF: NONE SEEN (ref 0–?)
Specific Gravity, Urine: 1.015 (ref 1.000–1.030)
Total Protein, Urine: NEGATIVE
Urine Glucose: NEGATIVE
Urobilinogen, UA: 0.2 (ref 0.0–1.0)
pH: 7 (ref 5.0–8.0)

## 2018-12-22 LAB — LIPID PANEL
Cholesterol: 97 mg/dL (ref 0–200)
HDL: 37.4 mg/dL — ABNORMAL LOW (ref >39.00)
LDL Cholesterol: 42 mg/dL (ref 0–99)
NonHDL: 59.72
Total CHOL/HDL Ratio: 3
Triglycerides: 88 mg/dL (ref 0.0–149.0)
VLDL: 17.6 mg/dL (ref 0.0–40.0)

## 2018-12-22 LAB — MICROALBUMIN / CREATININE URINE RATIO
Creatinine,U: 12.8 mg/dL
Microalb Creat Ratio: 33.9 mg/g — ABNORMAL HIGH (ref 0.0–30.0)
Microalb, Ur: 4.3 mg/dL — ABNORMAL HIGH (ref 0.0–1.9)

## 2018-12-22 LAB — TSH: TSH: 1.38 u[IU]/mL (ref 0.35–4.50)

## 2018-12-22 LAB — PSA: PSA: 1.28 ng/mL (ref 0.10–4.00)

## 2018-12-22 LAB — VITAMIN D 25 HYDROXY (VIT D DEFICIENCY, FRACTURES): VITD: 39.22 ng/mL (ref 30.00–100.00)

## 2018-12-22 LAB — VITAMIN B12: Vitamin B-12: 155 pg/mL — ABNORMAL LOW (ref 211–911)

## 2018-12-22 LAB — HEMOGLOBIN A1C: Hgb A1c MFr Bld: 6 % (ref 4.6–6.5)

## 2018-12-23 ENCOUNTER — Other Ambulatory Visit: Payer: Self-pay | Admitting: Internal Medicine

## 2018-12-23 DIAGNOSIS — E538 Deficiency of other specified B group vitamins: Secondary | ICD-10-CM

## 2018-12-24 ENCOUNTER — Encounter: Payer: Self-pay | Admitting: Internal Medicine

## 2018-12-24 NOTE — Assessment & Plan Note (Signed)
Encouraged pt to cont to monitor BP at Home and next visit, goal < 140/90

## 2018-12-24 NOTE — Assessment & Plan Note (Signed)
stable overall by history and exam, recent data reviewed with pt, and pt to continue medical treatment as before,  to f/u any worsening symptoms or concerns, for lipids with labs 

## 2018-12-24 NOTE — Assessment & Plan Note (Signed)
Also for f/u psa with labs, asympt,  to f/u any worsening symptoms or concerns

## 2018-12-24 NOTE — Assessment & Plan Note (Addendum)
Mild to mod, for antibx course,  to f/u any worsening symptoms or concerns  Note:  Total time for pt hx, exam, review of record with pt in the room, determination of diagnoses and plan for further eval and tx is > 40 min, with over 50% spent in coordination and counseling of patient including the differential dx, tx, further evaluation and other management of infected tick bite, HTN, DM, HLD, elevated psa

## 2018-12-24 NOTE — Assessment & Plan Note (Signed)
stable overall by history and exam, recent data reviewed with pt, and pt to continue medical treatment as before,  to f/u any worsening symptoms or concerns, for a1c with labs 

## 2018-12-26 ENCOUNTER — Telehealth: Payer: Self-pay

## 2018-12-26 NOTE — Telephone Encounter (Signed)
Pt has viewed results via MyChart  

## 2018-12-26 NOTE — Telephone Encounter (Signed)
-----   Message from [name], MD sent at 12/23/2018 12:02 PM EDT ----- Left message on MyChart, pt to cont same tx except  The test results show that your current treatment is OK, except the Vitamin B12 level is quite low. We will need the office to call you to start monthly B12 shots for at least 6 months until this can be checked again, and then plan to take OTC B12 after that.Redmond Baseman to please inform pt, and ask pt to start monthly b12 shots, and I will add B12 level to next labs

## 2018-12-27 ENCOUNTER — Other Ambulatory Visit: Payer: Self-pay | Admitting: Student

## 2019-01-04 ENCOUNTER — Ambulatory Visit (INDEPENDENT_AMBULATORY_CARE_PROVIDER_SITE_OTHER): Payer: Medicare Other

## 2019-01-04 DIAGNOSIS — E538 Deficiency of other specified B group vitamins: Secondary | ICD-10-CM | POA: Diagnosis not present

## 2019-01-04 MED ORDER — CYANOCOBALAMIN 1000 MCG/ML IJ SOLN
1000.0000 ug | Freq: Once | INTRAMUSCULAR | Status: AC
  Administered 2019-01-04: 1000 ug via INTRAMUSCULAR

## 2019-01-04 NOTE — Progress Notes (Signed)
Medical screening examination/treatment/procedure(s) were performed by non-physician practitioner and as supervising physician I was immediately available for consultation/collaboration. I agree with above. James John, MD   

## 2019-02-05 ENCOUNTER — Ambulatory Visit (INDEPENDENT_AMBULATORY_CARE_PROVIDER_SITE_OTHER): Payer: Medicare Other

## 2019-02-05 DIAGNOSIS — E538 Deficiency of other specified B group vitamins: Secondary | ICD-10-CM | POA: Diagnosis not present

## 2019-02-05 MED ORDER — CYANOCOBALAMIN 1000 MCG/ML IJ SOLN
1000.0000 ug | Freq: Once | INTRAMUSCULAR | Status: AC
  Administered 2019-02-05: 1000 ug via INTRAMUSCULAR

## 2019-02-05 NOTE — Progress Notes (Signed)
Medical screening examination/treatment/procedure(s) were performed by non-physician practitioner and as supervising physician I was immediately available for consultation/collaboration. I agree with above. Wynetta Seith, MD   

## 2019-02-06 NOTE — Progress Notes (Signed)
Virtual Visit via Video Note changed to phone visit due to technical difficulties.   This visit type was conducted due to national recommendations for restrictions regarding the COVID-19 Pandemic (e.g. social distancing) in an effort to limit this patient's exposure and mitigate transmission in our community.  Due to his co-morbid illnesses, this patient is at least at moderate risk for complications without adequate follow up.  This format is felt to be most appropriate for this patient at this time.  All issues noted in this document were discussed and addressed.  A limited physical exam was performed with this format.  Please refer to the patient's chart for his consent to telehealth for [facility].   Date:  02/08/2019   ID:  [name] [name], DOB Dec 22, 1945, MRN [id number]  Patient Location:Home Provider Location: Home  PCP:  [name], MD  Cardiologist:  Dr [name]  Evaluation Performed:  Follow-Up Visit  Chief Complaint:  FU atrial fibrillation  History of Present Illness:    FU permanent AFib and right side CHF. He has failed DCCV in the past. Myoview 7/09: EF 54%, normal perfusion. Holter 09/2010: AFib with mildly decreased rate. Right heart failure felt secondary to pulmonary hypertension which is multifactorial including OSA, OHS and possible pulmonary venous hypertension. [place] 01/08/13 demonstrated elevated R heart pressures (RA 15, RV 44/11, PCWP 21, PA 41/30, CO 4.49, CI 1.8, PA sat 61%). Patient has had some degree of renal insufficiency and hyperkalemia on a combination of ACE inhibitor and spironolactone. Last echo 5/17 showed EF 55-60, biatrial enlargement, small pericardial effusion. Admitted May 2017 with worsening congestive heart failure. Diuresed with improvement. Since last seen  he has some dyspnea on exertion unchanged.  No orthopnea, chest pain or syncope.  No bleeding.  He has noticed increased edema in his upper thighs bilaterally.  The patient does not have symptoms  concerning for COVID-19 infection (fever, chills, cough, or new shortness of breath).    Past Medical History:  Diagnosis Date   Acute right-sided CHF (congestive heart failure) ([place])    a. 01/2013.   Arthritis    "knees and hands; thoracic area of the spine" (01/05/2013)   BRONCHITIS, CHRONIC 01/02/2009   COMMON MIGRAINE    "none since treating for high BP"   Complication of anesthesia    "aspiration pneumonia after hand OR" (01/05/2013)   DIABETES MELLITUS, TYPE II    GOUT    HYPERLIPIDEMIA    HYPERTENSION    Long term (current) use of anticoagulants    Morbid obesity ([facility])    OBESITY HYPOVENTILATION SYNDROME    a. on home O2.   On home oxygen therapy    OSA (obstructive sleep apnea)    Permanent atrial fibrillation    PROSTATE SPECIFIC ANTIGEN, ELEVATED 06/09/2007   Pulmonary HTN ([place])    a. multifactorial including obstructive sleep apnea, obesity hypoventilation syndrome and possible pulmonary venous hypertension.   SKIN RASH 03/12/2010   Unspecified hearing loss    Past Surgical History:  Procedure Laterality Date   APPENDECTOMY  1974   CARDIOVERSION  05/21/2008; ~ 06/2008   HERNIA REPAIR     INGUINAL HERNIA REPAIR Right    RIGHT HEART CATHETERIZATION N/A 01/08/2013   Procedure: RIGHT HEART CATH;  Surgeon: Thayer Headings, MD;  Location: [facility] CATH LAB;  Service: Cardiovascular;  Laterality: N/A;   TENDON REPAIR Right 2009   "lacerated his tendon and pulley" Dr. Lenon Curt   UMBILICAL HERNIA REPAIR  Current Meds  Medication Sig   allopurinol (ZYLOPRIM) 100 MG tablet TAKE 1 TABLET DAILY   atorvastatin (LIPITOR) 20 MG tablet TAKE 1 TABLET DAILY   cetirizine (ZYRTEC) 10 MG tablet Take 10 mg by mouth at bedtime.   diltiazem (CARDIZEM CD) 180 MG 24 hr capsule TAKE 1 CAPSULE DAILY. ( NEED OFFICE VISIT )   ELIQUIS 5 MG TABS tablet TAKE 1 TABLET TWICE A DAY   glipiZIDE (GLUCOTROL XL) 10 MG 24 hr tablet TAKE 1 TABLET DAILY WITH BREAKFAST    glucose blood (ONE TOUCH ULTRA TEST) test strip Use to check blood sugars once daily Dx E11.9   Lancets MISC Use as directed once daily  E11.9   metFORMIN (GLUCOPHAGE-XR) 500 MG 24 hr tablet TAKE 2 TABLETS DAILY WITH BREAKFAST   metoprolol tartrate (LOPRESSOR) 25 MG tablet TAKE 1 TABLET TWICE A DAY   OXYGEN Inhale 3-5 L into the lungs daily. 3L Daily  4L Resting  5L Exertion   potassium chloride SA (K-DUR) 20 MEQ tablet TAKE 1 TABLET TWICE A DAY   torsemide (DEMADEX) 20 MG tablet TAKE 2 TABLETS TWICE A DAY (PLEASE CONTACT OFFICE FOR ADDITIONAL REFILLS)   Vitamin D, Ergocalciferol, (DRISDOL) 1.25 MG (50000 UT) CAPS capsule Take one capsule every 7 days.     Allergies:   Codeine and Heparin   Social History   Tobacco Use   Smoking status: Former Smoker    Packs/day: 1.00    Years: 15.00    Pack years: 15.00    Types: Cigarettes    Quit date: 08/09/1978    Years since quitting: 40.5   Smokeless tobacco: Never Used   Tobacco comment: 01/05/2013 "quit smoking ~ 40 years ago"  Substance Use Topics   Alcohol use: Yes    Alcohol/week: 0.0 standard drinks    Comment: 01/05/2013 "hasn't had a drink since 1980's; never had problem w/it"   Drug use: No     Family Hx: The patient's family history includes Alzheimer's disease in his father; Cancer in an other family member; Coronary artery disease in an other family member; Prostate cancer in his father.  ROS:   Please see the history of present illness.    No Fever, chills  or productive cough All other systems reviewed and are negative.  Recent Labs: 12/22/2018: ALT 16; BUN 19; Creatinine, Ser 1.08; Hemoglobin 15.2; Platelets 256.0; Potassium 4.6; Sodium 144; TSH 1.38   Recent Lipid Panel Lab Results  Component Value Date/Time   CHOL 97 12/22/2018 08:30 AM   TRIG 88.0 12/22/2018 08:30 AM   HDL 37.40 (L) 12/22/2018 08:30 AM   CHOLHDL 3 12/22/2018 08:30 AM   LDLCALC 42 12/22/2018 08:30 AM    Wt Readings from Last 3  Encounters:  02/08/19 262 lb 3.2 oz (118.9 kg)  12/19/18 264 lb (119.7 kg)  09/26/18 261 lb 6.4 oz (118.6 kg)     Objective:    Vital Signs:  BP 135/85    Pulse 64    Ht 5\' 8"  (1.727 m)    Wt 262 lb 3.2 oz (118.9 kg)    BMI 39.87 kg/m    VITAL SIGNS:  reviewed NAD Answers questions appropriately Normal affect Remainder of physical examination not performed (telehealth visit; coronavirus pandemic)  ASSESSMENT & PLAN:    1. Permanent atrial fibrillation-plan to continue Cardizem and metoprolol for rate control.  Continue apixaban.  2. Chronic diastolic congestive heart failure-patient has excess volume by his report.  I will increase Demadex to 60  mg in the morning and 40 mg in the evening for 4 days then resume 40 mg twice daily with an additional 20 mg as needed.  We discussed fluid restriction and low-sodium diet.  Predominant issue is likely right sided heart failure from obesity hypoventilation syndrome, sleep apnea. 3. Hypertension-patient's blood pressure is controlled.  Continue present medications and follow. 4. Hyperlipidemia-continue statin.  5. Morbid obesity-he has lost weight and I encouraged him to continue efforts.    COVID-19 Education: The importance of social distancing was discussed today.  Time:   Today, I have spent 19 minutes with the patient with telehealth technology discussing the above problems.     Medication Adjustments/Labs and Tests Ordered: Current medicines are reviewed at length with the patient today.  Concerns regarding medicines are outlined above.   Tests Ordered: No orders of the defined types were placed in this encounter.   Medication Changes: No orders of the defined types were placed in this encounter.   Follow Up:  Virtual Visit or In Person in 6 month(s)  Signed, Kirk Ruths, MD  02/08/2019 10:04 AM    [place]

## 2019-02-07 ENCOUNTER — Telehealth: Payer: Self-pay | Admitting: Cardiology

## 2019-02-07 NOTE — Telephone Encounter (Signed)
LVM for pt, reminding him of his appt on 02-08-19 with Dr [name].

## 2019-02-08 ENCOUNTER — Telehealth (INDEPENDENT_AMBULATORY_CARE_PROVIDER_SITE_OTHER): Payer: Medicare Other | Admitting: Cardiology

## 2019-02-08 ENCOUNTER — Encounter: Payer: Self-pay | Admitting: Cardiology

## 2019-02-08 VITALS — BP 135/85 | HR 64 | Ht 68.0 in | Wt 262.2 lb

## 2019-02-08 DIAGNOSIS — I4821 Permanent atrial fibrillation: Secondary | ICD-10-CM | POA: Diagnosis not present

## 2019-02-08 DIAGNOSIS — I5032 Chronic diastolic (congestive) heart failure: Secondary | ICD-10-CM

## 2019-02-08 DIAGNOSIS — I10 Essential (primary) hypertension: Secondary | ICD-10-CM

## 2019-02-08 NOTE — Patient Instructions (Signed)
Medication Instructions:  INCREASE DEMADEX TO 60 MG IN THE MORNING AND 40 MG IN THE AFTERNOON FOR THE NEXT 4 DAYS THEN= DECREASE TO DEMADEX 40 MG TWICE DAILY If you need a refill on your cardiac medications before your next appointment, please call your pharmacy.   Lab work: Your physician recommends that you return for lab work in: [place] If you have labs (blood work) drawn today and your tests are completely normal, you will receive your results only by: Marland Kitchen MyChart Message (if you have MyChart) OR . A paper copy in the mail If you have any lab test that is abnormal or we need to change your treatment, we will call you to review the results.  Follow-Up: At [facility], you and your health needs are our priority.  As part of our continuing mission to provide you with exceptional heart care, we have created designated Provider Care Teams.  These Care Teams include your primary Cardiologist (physician) and Advanced Practice Providers (APPs -  Physician Assistants and Nurse Practitioners) who all work together to provide you with the care you need, when you need it. You will need a follow up appointment in 6 months.  Please call our office 2 months in advance to schedule this appointment.  You may see Kirk Ruths, MD or one of the following Advanced Practice Providers on your designated Care Team:   Kerin Ransom, PA-C Roby Lofts, [place] . Sande Rives, PA-C

## 2019-02-15 DIAGNOSIS — I5032 Chronic diastolic (congestive) heart failure: Secondary | ICD-10-CM | POA: Diagnosis not present

## 2019-02-16 LAB — BASIC METABOLIC PANEL
BUN/Creatinine Ratio: 18 (ref 10–24)
BUN: 20 mg/dL (ref 8–27)
CO2: 27 mmol/L (ref 20–29)
Calcium: 9.9 mg/dL (ref 8.6–10.2)
Chloride: 105 mmol/L (ref 96–106)
Creatinine, Ser: 1.11 mg/dL (ref 0.76–1.27)
GFR calc Af Amer: 76 mL/min/{1.73_m2} (ref >59)
GFR calc non Af Amer: 66 mL/min/{1.73_m2} (ref >59)
Glucose: 112 mg/dL — ABNORMAL HIGH (ref 65–99)
Potassium: 4.1 mmol/L (ref 3.5–5.2)
Sodium: 145 mmol/L — ABNORMAL HIGH (ref 134–144)

## 2019-03-05 ENCOUNTER — Ambulatory Visit (INDEPENDENT_AMBULATORY_CARE_PROVIDER_SITE_OTHER): Payer: Medicare Other

## 2019-03-05 DIAGNOSIS — E538 Deficiency of other specified B group vitamins: Secondary | ICD-10-CM | POA: Diagnosis not present

## 2019-03-05 MED ORDER — CYANOCOBALAMIN 1000 MCG/ML IJ SOLN
1000.0000 ug | Freq: Once | INTRAMUSCULAR | Status: AC
  Administered 2019-03-05: 1000 ug via INTRAMUSCULAR

## 2019-03-05 NOTE — Progress Notes (Signed)
Medical screening examination/treatment/procedure(s) were performed by non-physician practitioner and as supervising physician I was immediately available for consultation/collaboration. I agree with above. Marializ Ferrebee, MD   

## 2019-03-29 ENCOUNTER — Other Ambulatory Visit: Payer: Self-pay | Admitting: Internal Medicine

## 2019-04-06 ENCOUNTER — Ambulatory Visit (INDEPENDENT_AMBULATORY_CARE_PROVIDER_SITE_OTHER): Payer: Medicare Other

## 2019-04-06 ENCOUNTER — Other Ambulatory Visit: Payer: Self-pay

## 2019-04-06 DIAGNOSIS — E538 Deficiency of other specified B group vitamins: Secondary | ICD-10-CM

## 2019-04-06 MED ORDER — CYANOCOBALAMIN 1000 MCG/ML IJ SOLN
1000.0000 ug | Freq: Once | INTRAMUSCULAR | Status: AC
  Administered 2019-04-06: 1000 ug via INTRAMUSCULAR

## 2019-04-06 NOTE — Progress Notes (Signed)
Medical screening examination/treatment/procedure(s) were performed by non-physician practitioner and as supervising physician I was immediately available for consultation/collaboration. I agree with above. James John, MD   

## 2019-05-07 ENCOUNTER — Ambulatory Visit: Payer: Medicare Other

## 2019-05-10 ENCOUNTER — Other Ambulatory Visit: Payer: Self-pay | Admitting: *Deleted

## 2019-05-10 ENCOUNTER — Ambulatory Visit (INDEPENDENT_AMBULATORY_CARE_PROVIDER_SITE_OTHER): Payer: Medicare Other

## 2019-05-10 ENCOUNTER — Other Ambulatory Visit: Payer: Self-pay | Admitting: Internal Medicine

## 2019-05-10 DIAGNOSIS — Z23 Encounter for immunization: Secondary | ICD-10-CM

## 2019-05-10 DIAGNOSIS — E538 Deficiency of other specified B group vitamins: Secondary | ICD-10-CM

## 2019-05-10 DIAGNOSIS — Z20822 Contact with and (suspected) exposure to covid-19: Secondary | ICD-10-CM

## 2019-05-10 MED ORDER — CYANOCOBALAMIN 1000 MCG/ML IJ SOLN
1000.0000 ug | Freq: Once | INTRAMUSCULAR | Status: AC
  Administered 2019-05-10: 1000 ug via INTRAMUSCULAR

## 2019-05-10 NOTE — Progress Notes (Signed)
Medical screening examination/treatment/procedure(s) were performed by non-physician practitioner and as supervising physician I was immediately available for consultation/collaboration. I agree with above. James John, MD   

## 2019-05-11 LAB — NOVEL CORONAVIRUS, NAA: SARS-CoV-2, NAA: NOT DETECTED

## 2019-05-22 ENCOUNTER — Other Ambulatory Visit: Payer: Self-pay | Admitting: Internal Medicine

## 2019-05-27 ENCOUNTER — Other Ambulatory Visit: Payer: Self-pay | Admitting: Student

## 2019-05-27 ENCOUNTER — Other Ambulatory Visit: Payer: Self-pay | Admitting: Cardiology

## 2019-06-11 ENCOUNTER — Ambulatory Visit (INDEPENDENT_AMBULATORY_CARE_PROVIDER_SITE_OTHER): Payer: Medicare Other

## 2019-06-11 DIAGNOSIS — E538 Deficiency of other specified B group vitamins: Secondary | ICD-10-CM

## 2019-06-11 MED ORDER — CYANOCOBALAMIN 1000 MCG/ML IJ SOLN
1000.0000 ug | Freq: Once | INTRAMUSCULAR | Status: AC
  Administered 2019-06-11: 1000 ug via INTRAMUSCULAR

## 2019-06-11 NOTE — Progress Notes (Signed)
Medical screening examination/treatment/procedure(s) were performed by non-physician practitioner and as supervising physician I was immediately available for consultation/collaboration. I agree with above. James John, MD   

## 2019-07-10 NOTE — Telephone Encounter (Signed)
LMTCB with [employer].

## 2019-07-10 NOTE — Telephone Encounter (Signed)
ATC, NA- need to know what exactly is needed

## 2019-07-11 ENCOUNTER — Ambulatory Visit (INDEPENDENT_AMBULATORY_CARE_PROVIDER_SITE_OTHER): Payer: Medicare Other | Admitting: *Deleted

## 2019-07-11 ENCOUNTER — Other Ambulatory Visit: Payer: Self-pay

## 2019-07-11 DIAGNOSIS — E538 Deficiency of other specified B group vitamins: Secondary | ICD-10-CM

## 2019-07-11 MED ORDER — CYANOCOBALAMIN 1000 MCG/ML IJ SOLN
1000.0000 ug | Freq: Once | INTRAMUSCULAR | Status: AC
  Administered 2019-07-11: 1000 ug via INTRAMUSCULAR

## 2019-07-11 NOTE — Progress Notes (Signed)
Medical screening examination/treatment/procedure(s) were performed by non-physician practitioner and as supervising physician I was immediately available for consultation/collaboration. I agree with above. James John, MD   

## 2019-07-11 NOTE — Telephone Encounter (Signed)
Called and spoke with pt's daughter Juliann Pulse. An appt has been scheduled for pt with VS tomorrow, 12/3 at 10:45. covid screen was done. Nothing further needed.

## 2019-07-11 NOTE — Telephone Encounter (Signed)
Called [employer] and spoke with Latoya.  Apparently the last documentation [employer] has regarding need and usage of O2 along with qualifying sats is from March 2019.  They are needing updated information to continue billing insurance and servicing patient.  Patient will need to be seen within the next 30-45 days and will need to call [employer] with his upcoming appt information.  As of right now, there is no leniency with the rising COVID cases in the community.  E-mail sent to patient.

## 2019-07-12 ENCOUNTER — Telehealth: Payer: Self-pay | Admitting: Pulmonary Disease

## 2019-07-12 ENCOUNTER — Encounter: Payer: Self-pay | Admitting: Pulmonary Disease

## 2019-07-12 ENCOUNTER — Ambulatory Visit (INDEPENDENT_AMBULATORY_CARE_PROVIDER_SITE_OTHER): Payer: Medicare Other | Admitting: Pulmonary Disease

## 2019-07-12 VITALS — BP 138/82 | HR 67 | Temp 97.0°F | Ht 68.0 in | Wt 264.2 lb

## 2019-07-12 DIAGNOSIS — J9611 Chronic respiratory failure with hypoxia: Secondary | ICD-10-CM | POA: Diagnosis not present

## 2019-07-12 DIAGNOSIS — G4733 Obstructive sleep apnea (adult) (pediatric): Secondary | ICD-10-CM | POA: Diagnosis not present

## 2019-07-12 DIAGNOSIS — E662 Morbid (severe) obesity with alveolar hypoventilation: Secondary | ICD-10-CM

## 2019-07-12 DIAGNOSIS — J9612 Chronic respiratory failure with hypercapnia: Secondary | ICD-10-CM | POA: Diagnosis not present

## 2019-07-12 NOTE — Telephone Encounter (Signed)
[name] [name] sent to Angelina Sheriff, Rachell Cipro, [place]; [place], [place], this patient's account is on hold and we cannot provide any further services until his account is cleared up. Please have him contact our company to discuss the issues.  Thank you.   Previous Messages  ----- Message -----  From: [name] [name]  Sent: 07/12/2019  1:37 PM EST  To: Darlina Guys, [name] [name], *  Subject: RE: O2 tanks & new bi pap mask          Holly, I got the BIPAP mask portion.  Sonia Baller   ----- Message -----  From: Vella Kohler D  Sent: 07/12/2019 11:11 AM EST  To: Darlina Guys, [name] [name], *  Subject: O2 tanks & new bi pap mask            DOB: 03/02/1946   Order placed by Dr. Chesley Mires. Please advise.   Thank you,  [facility]

## 2019-07-12 NOTE — Patient Instructions (Signed)
Will have [employer] get you a replacement Bipap mask and check your home oxygen concentrator

## 2019-07-12 NOTE — Progress Notes (Signed)
[facility]  Chief Complaint  Patient presents with  . OSA on BIPAP    Constitutional:  BP 138/82 (BP Location: Right Arm, Patient Position: Sitting, Cuff Size: Normal)   Pulse 67   Temp (!) 97 F (36.1 C)   Ht 5\' 8"  (1.727 m)   Wt 264 lb 3.2 oz (119.8 kg)   SpO2 99% Comment: on 4L pulse  BMI 40.17 kg/m   Past Medical History:  A fib, HTN, HLD, Gout, DM, Migraine HA, OA  Brief Summary:  [name] [name] is a 73 y.o. male former smoker with severe OSA and OHS.  He is doing well with Bipap.  Denies sinus congestion, sore throat, dry mouth or aerophagia.  Uses 4 liters oxygen 24/7, including with Bipap at night.  Not having chest pain, cough, sputum, wheeze, or leg swelling.  His mask is leaking.  His daughter is concerned that his O2 tanks can't be filled, and needs [employer] to come check his set up.  Physical Exam:   Appearance - well kempt   ENMT - clear nasal mucosa, midline nasal  septum, no oral exudates, no LAN, trachea midline  Respiratory - normal chest wall, normal respiratory effort, no accessory muscle use, no wheeze/rales  CV - s1s2 regular rate and rhythm, no murmurs, no peripheral edema, radial pulses symmetric  GI - soft, non tender, no masses  Lymph - no adenopathy noted in neck and axillary areas  MSK - normal gait  Ext - no cyanosis, clubbing, or joint inflammation noted  Skin - chronic venous stasis changes in lower legs  Neuro - normal strength, oriented x 3  Psych - normal mood and affect   Assessment/Plan:   Obstructive sleep apnea. - he failed previous attempt with CPAP - he is compliant with Bipap and reports benefit - continue auto Bipap - will arrange for new mask  Chronic hypoxic/hypercapnic respiratory failure 2nd to obesity hypoventilation syndrome. - goal SpO2 is > 90% - continue 4 liters oxygen 24/7, including with Bipap at night - will have his DME assess his home oxygen concentrator and  ensure he has adequate number of tanks he can fill  Obesity. - discussed importance of weight loss   Patient Instructions  Will have [employer] get you a replacement Bipap mask and check your home oxygen concentrator    Chesley Mires, MD [place] Pager: [phone] 07/12/2019, 10:51 AM  Flow Sheet     Pulmonary tests:  ABG 02/09/08 >> pH 7.37, PCO2 55.1, PO2 76.4 Spirometry 04/03/09 >> FEV1 2.08(69%), FEV1% 77 PFT 02/16/13 >> FEV1 2.06 (76%), FEV1% 85, TLC 1.64 (74%), DLCO 78%, no BD  Sleep tests:  PSG 06/18/08 >> AHI 64, BPAP 18/9 with 4 liters oxygen ONO with BiPAP and 4 liters 01/18/13 >> Test time 8 hrs 55 min. Basal SpO2 91.7%, low SpO2 83%. Spent 32 min with SpO2 < 88%. Auto BiPAP 06/12/19 to 07/11/19 >> used on 30 of 30 nights with average 8 hrs 7 min.  Average AHI 1.6 with median Bipap 12/8 and 95 th percentile Bipap 13/9 cm H2O  Cardiac tests:  Echo5/17/17 >> EF 55 to 60%, severe LA dilation  Medications:   Allergies as of 07/12/2019      Reactions   Codeine    Altered mental status "goofy"   Heparin Rash    Abdominal rash 01/2013      Medication List       Accurate as of July 12, 2019  10:51 AM. If you have any questions, ask your nurse or doctor.        allopurinol 100 MG tablet Commonly known as: ZYLOPRIM TAKE 1 TABLET DAILY   atorvastatin 20 MG tablet Commonly known as: LIPITOR TAKE 1 TABLET DAILY   cetirizine 10 MG tablet Commonly known as: ZYRTEC Take 10 mg by mouth at bedtime.   diltiazem 180 MG 24 hr capsule Commonly known as: CARDIZEM CD TAKE 1 CAPSULE DAILY (NEED OFFICE VISIT)   Eliquis 5 MG Tabs tablet Generic drug: apixaban TAKE 1 TABLET TWICE A DAY   glipiZIDE 10 MG 24 hr tablet Commonly known as: GLUCOTROL XL TAKE 1 TABLET DAILY WITH BREAKFAST   glucose blood test strip Commonly known as: ONE TOUCH ULTRA TEST Use to check blood sugars once daily Dx E11.9   Lancets Misc Use as directed once daily   E11.9   metFORMIN 500 MG 24 hr tablet Commonly known as: GLUCOPHAGE-XR TAKE 2 TABLETS DAILY WITH BREAKFAST   metoprolol tartrate 25 MG tablet Commonly known as: LOPRESSOR TAKE 1 TABLET TWICE A DAY   OXYGEN Inhale 3-5 L into the lungs daily. 3L Daily  4L Resting  5L Exertion   potassium chloride SA 20 MEQ tablet Commonly known as: KLOR-CON TAKE 1 TABLET TWICE A DAY   torsemide 20 MG tablet Commonly known as: DEMADEX TAKE 2 TABLETS TWICE A DAY (PLEASE CONTACT OFFICE FOR ADDITIONAL REFILLS)   Vitamin D (Ergocalciferol) 1.25 MG (50000 UT) Caps capsule Commonly known as: DRISDOL Take one capsule every 7 days.       Past Surgical History:  He  has a past surgical history that includes Appendectomy (1974); Tendon repair (Right, 2009); Cardioversion (05/21/2008; ~ 06/2008); Inguinal hernia repair (Right); Hernia repair; Umbilical hernia repair; and right heart catheterization (N/A, 01/08/2013).  Family History:  His family history includes Alzheimer's disease in his father; Cancer in an other family member; Coronary artery disease in an other family member; Prostate cancer in his father.  Social History:  He  reports that he quit smoking about 40 years ago. His smoking use included cigarettes. He has a 15.00 pack-year smoking history. He has never used smokeless tobacco. He reports current alcohol use. He reports that he does not use drugs.

## 2019-07-12 NOTE — Telephone Encounter (Signed)
LMTCB

## 2019-07-17 NOTE — Telephone Encounter (Signed)
Will close encounter. See MyChart message from today (07/17/19).

## 2019-07-17 NOTE — Telephone Encounter (Signed)
Based on telephone note from 07/12/19 the order was placed but we received a message from [facility] stating that the patient's account was on hold. Provided [employer]'s contact number to [facility]. Will close the encounter from 12/3.

## 2019-07-18 NOTE — Telephone Encounter (Signed)
Mychart message received by pt's daughter Juliann Pulse which I have posted below:   To: [facility]    From: Dayna Barker    Created: 07/18/2019 3:12 PM     *-*-*This message was handled on 07/18/2019 3:31 PM by Ashni Lonzo P*-*-*  Hi  I was wondering if you were able to give a date to [facility] so we can get air tanks? We are in need.   Thank you [facility], is there any way you can help [place] out with this.

## 2019-07-18 NOTE — Telephone Encounter (Signed)
Message received from [employer]:  His account is and has been on hold for a couple of months now due to an issue with the account. We spoke extensively with the daughter in Oct about what the issue was, what needed to be done (he needs a new titrated sleep study and 6 Minute walk test, as well as a new office visit and new order for the oxygen. She indicated he is using the oxygen daytime and nighttime. She advised she would work on getting all that done and call us back, and she has not done so. This is the message I got back from [employer] Edwyna Perfect and spoke with pt's daughter Juliann Pulse letting her know this info and she verbalized understanding. appt scheduled for pt for 57mw at 10am followed by OV with VS at 10:45. Nothing further needed.

## 2019-07-19 ENCOUNTER — Encounter: Payer: Self-pay | Admitting: Pulmonary Disease

## 2019-07-19 ENCOUNTER — Telehealth: Payer: Self-pay | Admitting: Pulmonary Disease

## 2019-07-19 ENCOUNTER — Ambulatory Visit (INDEPENDENT_AMBULATORY_CARE_PROVIDER_SITE_OTHER): Payer: Medicare Other

## 2019-07-19 ENCOUNTER — Other Ambulatory Visit: Payer: Self-pay

## 2019-07-19 ENCOUNTER — Ambulatory Visit (INDEPENDENT_AMBULATORY_CARE_PROVIDER_SITE_OTHER): Payer: Medicare Other | Admitting: Pulmonary Disease

## 2019-07-19 VITALS — BP 134/90 | HR 69 | Temp 98.7°F | Ht 68.0 in | Wt 262.4 lb

## 2019-07-19 DIAGNOSIS — R0600 Dyspnea, unspecified: Secondary | ICD-10-CM

## 2019-07-19 DIAGNOSIS — E662 Morbid (severe) obesity with alveolar hypoventilation: Secondary | ICD-10-CM | POA: Diagnosis not present

## 2019-07-19 DIAGNOSIS — J9611 Chronic respiratory failure with hypoxia: Secondary | ICD-10-CM | POA: Diagnosis not present

## 2019-07-19 DIAGNOSIS — G4733 Obstructive sleep apnea (adult) (pediatric): Secondary | ICD-10-CM

## 2019-07-19 DIAGNOSIS — R0609 Other forms of dyspnea: Secondary | ICD-10-CM

## 2019-07-19 NOTE — Patient Instructions (Signed)
Will arrange for Bipap titration study in sleep lab  Follow up in 4 months

## 2019-07-19 NOTE — Telephone Encounter (Signed)
Spoke with Melissa  She is aware that pt was seen today and I advised her that the qualifying sats are in Epic  She states that she is looking into this further and will give [place] an update when she has one

## 2019-07-19 NOTE — Telephone Encounter (Signed)
Melissa calling back to give more information. [phone]

## 2019-07-19 NOTE — Telephone Encounter (Signed)
Spoke with Melissa and confirmed that nothing further is needed- no 6MW or titration  Will close out encounter

## 2019-07-19 NOTE — Progress Notes (Signed)
Patient did a qualifying walk in office today prior to office visit with provider.

## 2019-07-19 NOTE — Telephone Encounter (Signed)
Melissa returned call for [place].

## 2019-07-19 NOTE — Telephone Encounter (Signed)
Melissa returning your call. [phone]

## 2019-07-19 NOTE — Progress Notes (Signed)
[facility]  No chief complaint on file.   Constitutional:  BP 134/90 (BP Location: Left Arm, Cuff Size: Large)   Pulse 69   Temp 98.7 F (37.1 C) (Oral)   Ht 5\' 8"  (1.727 m)   Wt 262 lb 6.4 oz (119 kg)   SpO2 97%   BMI 39.90 kg/m   Past Medical History:  A fib, HTN, HLD, Gout, DM, Migraine HA, OA  Brief Summary:  [name] [name] is a 73 y.o. male former smoker with severe OSA and OHS.  He did ambulatory oximetry on room air today.  Dropped to 86% after 1 lap.  Improved with addition of supplemental oxygen at 4 liters pulsed.  He was told he needed a repeat office visit and sleep study for insurance to cover his Bipap and oxygen set up at night.  Nothing has changed since he was seen in office 1 week earlier.  Physical Exam:   Appearance - well kempt   ENMT - no sinus tenderness, no nasal discharge, no oral exudate  Neck - no masses, trachea midline, no thyromegaly, no elevation in JVP  Respiratory - normal appearance of chest wall, normal respiratory effort w/o accessory muscle use, no dullness on percussion, no wheezing or rales  CV - s1s2 regular rate and rhythm, no murmurs, no peripheral edema, radial pulses symmetric  GI - soft, non tender  Lymph - no adenopathy noted in neck and axillary areas  MSK - normal gait  Ext - no cyanosis, clubbing, or joint inflammation noted  Skin - no rashes, lesions, or ulcers  Neuro - normal strength, oriented x 3  Psych - normal mood and affect   Assessment/Plan:   Obstructive sleep apnea. - he has failed previous attempts with CPAP; these included adjusting his pressure and trying different masks - he is compliant with Bipap and reports benefit - continue auto Bipap  Chronic hypoxic/hypercapnic respiratory failure 2nd to obesity hypoventilation syndrome. - based on ambulatory oximetry on room air today, he requalifies for home oxygen set up - goal SpO2 > 90% - continue 4 liters  pulsed oxygen 24/7 - he will need to have in lab titration study with Bipap to requalify for nocturnal oxygen set up with Bipap for insurance coverage  Obesity. - discussed importance of weight loss   Patient Instructions  Will arrange for Bipap titration study in sleep lab  Follow up in 4 months    Chesley Mires, MD [place] Pager: [phone] 07/19/2019, 11:10 AM  Flow Sheet     Pulmonary tests:  ABG 02/09/08 >> pH 7.37, PCO2 55.1, PO2 76.4 Spirometry 04/03/09 >> FEV1 2.08(69%), FEV1% 77 PFT 02/16/13 >> FEV1 2.06 (76%), FEV1% 85, TLC 1.64 (74%), DLCO 78%, no BD  Sleep tests:  PSG 06/18/08 >> AHI 64, BPAP 18/9 with 4 liters oxygen ONO with BiPAP and 4 liters 01/18/13 >> Test time 8 hrs 55 min. Basal SpO2 91.7%, low SpO2 83%. Spent 32 min with SpO2 < 88%. Auto BiPAP 06/12/19 to 07/11/19 >> used on 30 of 30 nights with average 8 hrs 7 min.  Average AHI 1.6 with median Bipap 12/8 and 95 th percentile Bipap 13/9 cm H2O  Cardiac tests:  Echo5/17/17 >> EF 55 to 60%, severe LA dilation  Medications:   Allergies as of 07/19/2019      Reactions   Codeine    Altered mental status "goofy"   Heparin Rash    Abdominal rash 01/2013  Medication List       Accurate as of July 19, 2019 11:10 AM. If you have any questions, ask your nurse or doctor.        allopurinol 100 MG tablet Commonly known as: ZYLOPRIM TAKE 1 TABLET DAILY   atorvastatin 20 MG tablet Commonly known as: LIPITOR TAKE 1 TABLET DAILY   cetirizine 10 MG tablet Commonly known as: ZYRTEC Take 10 mg by mouth at bedtime.   cyanocobalamin 1000 MCG/ML injection Commonly known as: (VITAMIN B-12) Inject 1,000 mcg into the muscle.   diltiazem 180 MG 24 hr capsule Commonly known as: CARDIZEM CD TAKE 1 CAPSULE DAILY (NEED OFFICE VISIT)   Eliquis 5 MG Tabs tablet Generic drug: apixaban TAKE 1 TABLET TWICE A DAY   glipiZIDE 10 MG 24 hr tablet Commonly known as: GLUCOTROL XL  TAKE 1 TABLET DAILY WITH BREAKFAST   glucose blood test strip Commonly known as: ONE TOUCH ULTRA TEST Use to check blood sugars once daily Dx E11.9   Lancets Misc Use as directed once daily  E11.9   metFORMIN 500 MG 24 hr tablet Commonly known as: GLUCOPHAGE-XR TAKE 2 TABLETS DAILY WITH BREAKFAST   metoprolol tartrate 25 MG tablet Commonly known as: LOPRESSOR TAKE 1 TABLET TWICE A DAY   OXYGEN Inhale 3-5 L into the lungs daily. 3L Daily  4L Resting  5L Exertion   potassium chloride SA 20 MEQ tablet Commonly known as: KLOR-CON TAKE 1 TABLET TWICE A DAY   torsemide 20 MG tablet Commonly known as: DEMADEX TAKE 2 TABLETS TWICE A DAY (PLEASE CONTACT OFFICE FOR ADDITIONAL REFILLS)   Vitamin D (Ergocalciferol) 1.25 MG (50000 UT) Caps capsule Commonly known as: DRISDOL Take one capsule every 7 days.       Past Surgical History:  He  has a past surgical history that includes Appendectomy (1974); Tendon repair (Right, 2009); Cardioversion (05/21/2008; ~ 06/2008); Inguinal hernia repair (Right); Hernia repair; Umbilical hernia repair; and right heart catheterization (N/A, 01/08/2013).  Family History:  His family history includes Alzheimer's disease in his father; Cancer in an other family member; Coronary artery disease in an other family member; Prostate cancer in his father.  Social History:  He  reports that he quit smoking about 40 years ago. His smoking use included cigarettes. He has a 15.00 pack-year smoking history. He has never used smokeless tobacco. He reports current alcohol use. He reports that he does not use drugs.

## 2019-07-19 NOTE — Telephone Encounter (Addendum)
Called and spoke to [name] [name] with [employer] and was advised per the Recovery Team with [employer] the pt needs a 6 min walk. [name] [name] to qualify for O2 the insurance typically only needs a desaturation value and a recovery value and not the data from a 6MWT. [name] [name] states she didn't know and would have the Recovery Team call us back. Pt did qualifying walk for his O2 today and has an OV with VS.   Will await call from Recovery Team.

## 2019-07-28 ENCOUNTER — Other Ambulatory Visit (HOSPITAL_COMMUNITY)
Admission: RE | Admit: 2019-07-28 | Discharge: 2019-07-28 | Disposition: A | Discharge status: Home / Self Care | Payer: Medicare Other | Source: Ambulatory Visit | Attending: Pulmonary Disease | Admitting: Pulmonary Disease

## 2019-07-28 DIAGNOSIS — Z01812 Encounter for preprocedural laboratory examination: Secondary | ICD-10-CM | POA: Diagnosis present

## 2019-07-28 DIAGNOSIS — Z20828 Contact with and (suspected) exposure to other viral communicable diseases: Secondary | ICD-10-CM | POA: Insufficient documentation

## 2019-07-28 LAB — SARS CORONAVIRUS 2 (TAT 6-24 HRS): SARS Coronavirus 2: NEGATIVE

## 2019-07-31 ENCOUNTER — Ambulatory Visit (HOSPITAL_BASED_OUTPATIENT_CLINIC_OR_DEPARTMENT_OTHER): Payer: Medicare Other | Attending: Pulmonary Disease | Admitting: Pulmonary Disease

## 2019-07-31 ENCOUNTER — Other Ambulatory Visit: Payer: Self-pay

## 2019-07-31 VITALS — Ht 68.0 in | Wt 263.0 lb

## 2019-07-31 DIAGNOSIS — J9611 Chronic respiratory failure with hypoxia: Secondary | ICD-10-CM | POA: Diagnosis not present

## 2019-07-31 DIAGNOSIS — Z6841 Body Mass Index (BMI) 40.0 and over, adult: Secondary | ICD-10-CM | POA: Diagnosis not present

## 2019-07-31 DIAGNOSIS — E662 Morbid (severe) obesity with alveolar hypoventilation: Secondary | ICD-10-CM | POA: Insufficient documentation

## 2019-07-31 DIAGNOSIS — J9612 Chronic respiratory failure with hypercapnia: Secondary | ICD-10-CM | POA: Diagnosis not present

## 2019-07-31 DIAGNOSIS — G4733 Obstructive sleep apnea (adult) (pediatric): Secondary | ICD-10-CM | POA: Diagnosis present

## 2019-08-01 ENCOUNTER — Other Ambulatory Visit (HOSPITAL_BASED_OUTPATIENT_CLINIC_OR_DEPARTMENT_OTHER): Payer: Self-pay

## 2019-08-01 DIAGNOSIS — G4733 Obstructive sleep apnea (adult) (pediatric): Secondary | ICD-10-CM

## 2019-08-01 DIAGNOSIS — J9611 Chronic respiratory failure with hypoxia: Secondary | ICD-10-CM

## 2019-08-02 ENCOUNTER — Telehealth: Payer: Self-pay | Admitting: Pulmonary Disease

## 2019-08-02 DIAGNOSIS — J9611 Chronic respiratory failure with hypoxia: Secondary | ICD-10-CM | POA: Diagnosis not present

## 2019-08-02 DIAGNOSIS — G4733 Obstructive sleep apnea (adult) (pediatric): Secondary | ICD-10-CM | POA: Diagnosis not present

## 2019-08-02 DIAGNOSIS — J9612 Chronic respiratory failure with hypercapnia: Secondary | ICD-10-CM

## 2019-08-02 DIAGNOSIS — R0609 Other forms of dyspnea: Secondary | ICD-10-CM

## 2019-08-02 NOTE — Telephone Encounter (Signed)
Bipap titration study 07/31/19 >> Bipap 16/12 with 4 liters oxygen.   Please let him know that his Bipap titration study confirmed he needs supplemental oxygen with Bipap at night.  He needs 4 liters oxygen with auto Bipap.  He should continue using supplemental oxygen during the day.  Please send order to his DME to ensure that he has set up to attach supplemental oxygen to his Bipap machine.

## 2019-08-02 NOTE — Telephone Encounter (Signed)
Per DPR detailed message left regarding the information below from Dr. Halford Chessman and to call if there are any questions.  Order placed with [employer]. Nothing further needed at this time.

## 2019-08-02 NOTE — Procedures (Signed)
    Patient Name: [name] [name], [name] [name] Date: 07/31/2019 Gender: Male D.O.B: 08-14-1945 Age (years): 21 Referring Provider: Chesley Mires MD, ABSM Height (inches): 29 Interpreting Physician: Chesley Mires MD, ABSM Weight (lbs): 263 RPSGT: Carolin Coy BMI: 60 MRN: [id number] Neck Size: 17.50  CLINICAL INFORMATION 73 year old male with history of obstructive sleep apnea, obesity hypoventilation syndrome and chronic hypoxic respiratory failure.  He had polysomnogram from 06/18/08 with an AHI of 64.  He had previously been tried on CPAP with multiple mask fittings, but had insufficient control of his sleep apnea and oxygenation.  He uses auto Bipap at home.  He uses 4 liters supplemental oxygen during the day.  He presents for a Bipap titration study to prove to insurance that he requires supplemental oxygen at night also with Bipap.  SLEEP STUDY TECHNIQUE As per the AASM Manual for the Scoring of Sleep and Associated Events v2.3 (April 2016) with a hypopnea requiring 4% desaturations.  The channels recorded and monitored were frontal, central and occipital EEG, electrooculogram (EOG), submentalis EMG (chin), nasal and oral airflow, thoracic and abdominal wall motion, anterior tibialis EMG, snore microphone, electrocardiogram, and pulse oximetry. Bilevel positive airway pressure (BPAP) was initiated at the beginning of the study and titrated to treat sleep-disordered breathing.  MEDICATIONS Medications self-administered by patient taken the night of the study : N/A  RESPIRATORY PARAMETERS Optimal IPAP Pressure (cm): 16 AHI at Optimal Pressure (/hr) 1.4 Optimal EPAP Pressure (cm): 12   Overall Minimal O2 (%): 88.0 Minimal O2 at Optimal Pressure (%): 92.0  On presentation to the sleep lab his SpO2 was less than 88% on room air at rest.  He was adequately titrated on Bipap with control of his obstructive sleep apnea.  He required use of 4 liters oxygen with Bipap to maintain his  oxygenation.  SLEEP ARCHITECTURE Start Time: 9:51:41 PM Stop Time: 4:58:34 AM Total Time (min): 426.9 Total Sleep Time (min): 279.5 Sleep Latency (min): 12.8 Sleep Efficiency (%): 65.5% REM Latency (min): 101.5 WASO (min): 134.6 Stage N1 (%): 17.2% Stage N2 (%): 77.6% Stage N3 (%): 0.0% Stage R (%): 5.2 Supine (%): 100.00 Arousal Index (/hr): 29.2   CARDIAC DATA The 2 lead EKG demonstrated atrial fibrillation. The mean heart rate was 62.4 beats per minute. Other EKG findings include: PVCs.  LEG MOVEMENT DATA The total Periodic Limb Movements of Sleep (PLMS) were 0. The PLMS index was 0.0. A PLMS index of <15 is considered normal in adults.  IMPRESSIONS - He did well with Bipap 16/12 cm H2O. - He required 4 liters supplemental oxygen with Bipap to maintain his oxygen level.  DIAGNOSIS - Obstructive Sleep Apnea  - Obesity Hypoventilation Syndrome - Chronic Respiratory Failure with Hypoxia and Hypercapnia  RECOMMENDATIONS - He can continue auto Bipap setting as before. - He required 4 liters supplemental oxygen with Bipap.  [Electronically signed] 08/02/2019 10:44 AM  Chesley Mires MD, ABSM Diplomate, American Board of Sleep Medicine   NPI: [id number]

## 2019-08-13 ENCOUNTER — Other Ambulatory Visit: Payer: Self-pay

## 2019-08-13 ENCOUNTER — Ambulatory Visit (INDEPENDENT_AMBULATORY_CARE_PROVIDER_SITE_OTHER): Payer: Medicare Other

## 2019-08-13 DIAGNOSIS — E538 Deficiency of other specified B group vitamins: Secondary | ICD-10-CM

## 2019-08-13 MED ORDER — CYANOCOBALAMIN 1000 MCG/ML IJ SOLN
1000.0000 ug | Freq: Once | INTRAMUSCULAR | Status: AC
  Administered 2019-08-13: 1000 ug via INTRAMUSCULAR

## 2019-08-13 NOTE — Progress Notes (Signed)
Medical screening examination/treatment/procedure(s) were performed by non-physician practitioner and as supervising physician I was immediately available for consultation/collaboration. I agree with above. Tomorrow Dehaas, MD   

## 2019-08-14 ENCOUNTER — Encounter: Payer: Self-pay | Admitting: Family Medicine

## 2019-08-14 ENCOUNTER — Ambulatory Visit (INDEPENDENT_AMBULATORY_CARE_PROVIDER_SITE_OTHER): Payer: Medicare Other | Admitting: Family Medicine

## 2019-08-14 DIAGNOSIS — M7061 Trochanteric bursitis, right hip: Secondary | ICD-10-CM | POA: Diagnosis not present

## 2019-08-14 NOTE — Progress Notes (Signed)
[facility] [address] [place] Phone: [phone] Subjective:   I [name] am serving as a Education administrator for Dr. Hulan Saas.  This visit occurred during the SARS-CoV-2 public health emergency.  Safety protocols were in place, including screening questions prior to the visit, additional usage of staff PPE, and extensive cleaning of exam room while observing appropriate contact time as indicated for disinfecting solutions.      CC: Right hip pain  [id number]   05/31/2017  Discussed with patient. Patient is doing well. Does not want any intervention. Due to patient's abnormal thigh to calf ratio custom brace will be needed if patient continues to have instability. We'll consider this further evaluation. Patient does well he can follow-up as needed.  08/14/2019  Nochum D Milles is a 74 y.o. male coming in with complaint of right hip pain.   Onset- Chronic (September)  Location - lateral/ glut  Duration- constant pain  Character-aching sensation that is worse with certain activities Aggravating factors- standing, sitting, laying down  Reliving factors-decreasing activity but pain worse when he starts such as getting up from a seated position Therapies tried-  Severity- 5/10 at its worse sometimes can wake him up at night     Past Medical History:  Diagnosis Date  . Acute right-sided CHF (congestive heart failure) ([place])    a. 01/2013.  . Arthritis    "knees and hands; thoracic area of the spine" (01/05/2013)  . BRONCHITIS, CHRONIC 01/02/2009  . COMMON MIGRAINE    "none since treating for high BP"  . Complication of anesthesia    "aspiration pneumonia after hand OR" (01/05/2013)  . DIABETES MELLITUS, TYPE II   . GOUT   . HYPERLIPIDEMIA   . HYPERTENSION   . Long term (current) use of anticoagulants   . Morbid obesity ([place])   . OBESITY HYPOVENTILATION SYNDROME    a. on home O2.  . On home oxygen therapy   . OSA (obstructive sleep  apnea)   . Permanent atrial fibrillation ([place])   . PROSTATE SPECIFIC ANTIGEN, ELEVATED 06/09/2007  . Pulmonary HTN ([place])    a. multifactorial including obstructive sleep apnea, obesity hypoventilation syndrome and possible pulmonary venous hypertension.  Marland Kitchen SKIN RASH 03/12/2010  . Unspecified hearing loss    Past Surgical History:  Procedure Laterality Date  . APPENDECTOMY  1974  . CARDIOVERSION  05/21/2008; ~ 06/2008  . HERNIA REPAIR    . INGUINAL HERNIA REPAIR Right   . RIGHT HEART CATHETERIZATION N/A 01/08/2013   Procedure: RIGHT HEART CATH;  Surgeon: Thayer Headings, MD;  [facility];  Service: Cardiovascular;  Laterality: N/A;  . TENDON REPAIR Right 2009   "lacerated his tendon and pulley" Dr. Lenon Curt  . UMBILICAL HERNIA REPAIR     Social History   Socioeconomic History  . Marital status: Married    Spouse name: Not on file  . Number of children: 2  . Years of education: Not on file  . Highest education level: Not on file  Occupational History  . Occupation: Quarry manager: [employer]  Tobacco Use  . Smoking status: Former Smoker    Packs/day: 1.00    Years: 15.00    Pack years: 15.00    Types: Cigarettes    Quit date: 08/09/1978    Years since quitting: 41.0  . Smokeless tobacco: Never Used  . Tobacco comment: 01/05/2013 "quit smoking ~ 40 years ago"  Substance and  Sexual Activity  . Alcohol use: Yes    Alcohol/week: 0.0 standard drinks    Comment: 01/05/2013 "hasn't had a drink since 1980's; never had problem w/it"  . Drug use: No  . Sexual activity: Not Currently  Other Topics Concern  . Not on file  Social History Narrative  . Not on file   Social Determinants of Health   Financial Resource Strain:   . Difficulty of Paying Living Expenses: Not on file  Food Insecurity:   . Worried About Charity fundraiser in the Last Year: Not on file  . Ran Out of Food in the Last Year: Not on file  Transportation Needs:   . Lack of  Transportation (Medical): Not on file  . Lack of Transportation (Non-Medical): Not on file  Physical Activity:   . Days of Exercise per Week: Not on file  . Minutes of Exercise per Session: Not on file  Stress:   . Feeling of Stress : Not on file  Social Connections:   . Frequency of Communication with Friends and Family: Not on file  . Frequency of Social Gatherings with Friends and Family: Not on file  . Attends Religious Services: Not on file  . Active Member of Clubs or Organizations: Not on file  . Attends Archivist Meetings: Not on file  . Marital Status: Not on file   Allergies  Allergen Reactions  . Codeine     Altered mental status "goofy"  . Heparin Rash     Abdominal rash 01/2013   Family History  Problem Relation Age of Onset  . Alzheimer's disease Father   . Prostate cancer Father   . Cancer Other        Breast Cancer, <50 yo 1st degree relative  . Coronary artery disease Other        Male, 1st degree relative    Current Outpatient Medications (Endocrine & Metabolic):  .  glipiZIDE (GLUCOTROL XL) 10 MG 24 hr tablet, TAKE 1 TABLET DAILY WITH BREAKFAST .  metFORMIN (GLUCOPHAGE-XR) 500 MG 24 hr tablet, TAKE 2 TABLETS DAILY WITH BREAKFAST  Current Outpatient Medications (Cardiovascular):  .  atorvastatin (LIPITOR) 20 MG tablet, TAKE 1 TABLET DAILY .  diltiazem (CARDIZEM CD) 180 MG 24 hr capsule, TAKE 1 CAPSULE DAILY (NEED OFFICE VISIT) .  metoprolol tartrate (LOPRESSOR) 25 MG tablet, TAKE 1 TABLET TWICE A DAY .  torsemide (DEMADEX) 20 MG tablet, TAKE 2 TABLETS TWICE A DAY (PLEASE CONTACT OFFICE FOR ADDITIONAL REFILLS)  Current Outpatient Medications (Respiratory):  .  cetirizine (ZYRTEC) 10 MG tablet, Take 10 mg by mouth at bedtime.  Current Outpatient Medications (Analgesics):  .  allopurinol (ZYLOPRIM) 100 MG tablet, TAKE 1 TABLET DAILY  Current Outpatient Medications (Hematological):  .  cyanocobalamin (,VITAMIN B-12,) 1000 MCG/ML injection,  Inject 1,000 mcg into the muscle. Marland Kitchen  ELIQUIS 5 MG TABS tablet, TAKE 1 TABLET TWICE A DAY  Current Outpatient Medications (Other):  .  glucose blood (ONE TOUCH ULTRA TEST) test strip, Use to check blood sugars once daily Dx E11.9 .  Lancets MISC, Use as directed once daily  E11.9 .  OXYGEN, Inhale 3-5 L into the lungs daily. 3L Daily  4L Resting  5L Exertion .  potassium chloride SA (K-DUR) 20 MEQ tablet, TAKE 1 TABLET TWICE A DAY .  Vitamin D, Ergocalciferol, (DRISDOL) 1.25 MG (50000 UT) CAPS capsule, Take one capsule every 7 days.    Past medical history, social, surgical and family history all reviewed  in electronic medical record.  No pertanent information unless stated regarding to the chief complaint.   Review of Systems:  No headache, visual changes, nausea, vomiting, diarrhea, constipation, dizziness, abdominal pain, skin rash, fevers, chills, night sweats, weight loss, swollen lymph nodes, body aches,  chest pain, shortness of breath, mood changes.  Positive muscle aches, joint swelling  Objective  Blood pressure 120/80, pulse 69, height 5\' 8"  (1.727 m), weight 265 lb (120.2 kg), SpO2 93 %.    General: No apparent distress alert and oriented x3 mood and affect normal, dressed appropriately.  HEENT: Pupils equal, extraocular movements intact  Respiratory: Patient's speak in full sentences and does not appear short of breath patient now is wearing oxygen with a nasal cannula Cardiovascular: 1+ lower extremity edema, non tender, no erythema  Skin: Warm dry intact with no signs of infection or rash on extremities or on axial skeleton.  Abdomen: Soft nontender obese Neuro: Cranial nerves II through XII are intact, neurovascularly intact in all extremities with 2+ DTRs and 2+ pulses.  Lymph: No lymphadenopathy of posterior or anterior cervical chain or axillae bilaterally.  Gait antalgic MSK:  tender with limited range of motion and stability and symmetric strength and tone of  shoulders, elbows, wrist, ankles bilaterally.  Right hip exam shows the patient does have some decreased motion in all planes.  Tenderness along the lateral aspect of the hip.  More over the greater trochanteric area as well as the gluteal tendon near its insertion.  After verbal consent prepped with alcohol swab and with a 21-gauge 2 inch needle injected into the right greater trochanteric area with a total of 3 cc of 0.5% Marcaine and 1 cc of Kenalog 40 mg/mL.  No blood loss.  Band-Aid placed.  Postinjection instructions given.   Impression and Recommendations:     This case required medical decision making of moderate complexity. The above documentation has been reviewed and is accurate and complete Lyndal Pulley, DO       Note: This dictation was prepared with Dragon dictation along with smaller phrase technology. Any transcriptional errors that result from this process are unintentional.

## 2019-08-14 NOTE — Assessment & Plan Note (Addendum)
Patient given injection, tolerated the procedure well, discussed icing regimen and home exercise, which activities to do which wants to avoid.  Patient is to increase activity slowly over the course of next several weeks.  Discussed icing regimen again.  Decided to do injections secondary to coronavirus and would like patient to avoid coming back if possible.  Patient's significant other has multiple sclerosis and is not doing well at the moment and is on chronic steroids.  Differential includes lumbar radiculopathy would need to consider possible medications thereafter.

## 2019-08-14 NOTE — Patient Instructions (Signed)
Pennsaid 2x a day as needed Injected hip  Ice 34min a day as needed See me again in 6 weeks

## 2019-08-23 ENCOUNTER — Telehealth: Payer: Self-pay | Admitting: *Deleted

## 2019-08-23 NOTE — Telephone Encounter (Signed)
A message was left, re: his follow up visit. 

## 2019-09-04 ENCOUNTER — Other Ambulatory Visit: Payer: Self-pay

## 2019-09-04 MED ORDER — TORSEMIDE 20 MG PO TABS: ORAL_TABLET | ORAL | 0 refills | Status: DC

## 2019-09-04 NOTE — Progress Notes (Signed)
Virtual Visit via Video Note   This visit type was conducted due to national recommendations for restrictions regarding the COVID-19 Pandemic (e.g. social distancing) in an effort to limit this patient's exposure and mitigate transmission in our community.  Due to his co-morbid illnesses, this patient is at least at moderate risk for complications without adequate follow up.  This format is felt to be most appropriate for this patient at this time.  All issues noted in this document were discussed and addressed.  A limited physical exam was performed with this format.  Please refer to the patient's chart for his consent to telehealth for [facility]   Date:  09/07/2019   ID:  [name] [name], DOB 06-03-1946, MRN [id number]  Patient Location:Home Provider Location: Home  PCP:  [name], MD  Cardiologist:  Dr [name]  Evaluation Performed:  Follow-Up Visit  Chief Complaint:  FU atrial fibrillation  History of Present Illness:    FU permanent AFib and right side CHF. He has failed DCCV in the past. Myoview 7/09: EF 54%, normal perfusion. Holter 09/2010: AFib with mildly decreased rate. Right heart failure felt secondary to pulmonary hypertension which is multifactorial including OSA, OHS and possible pulmonary venous hypertension. [place] 01/08/13 demonstrated elevated R heart pressures (RA 15, RV 44/11, PCWP 21, PA 41/30, CO 4.49, CI 1.8, PA sat 61%). Patient has had some degree of renal insufficiency and hyperkalemia on a combination of ACE inhibitor and spironolactone. Last echo 5/17 showed EF 55-60, biatrial enlargement, small pericardial effusion. Admitted May 2017 with worsening congestive heart failure. Diuresed with improvement. Since last seenpatient has dyspnea on exertion unchanged.  His pedal edema is unchanged.  No chest pain, bleeding or syncope.  He was recently scratched and has noticed some redness around the scratch on his arm.  He denies fevers.  The patient does not have  symptoms concerning for COVID-19 infection (fever, chills, cough, or new shortness of breath).    Past Medical History:  Diagnosis Date  . Acute right-sided CHF (congestive heart failure) ([place])    a. 01/2013.  . Arthritis    "knees and hands; thoracic area of the spine" (01/05/2013)  . BRONCHITIS, CHRONIC 01/02/2009  . COMMON MIGRAINE    "none since treating for high BP"  . Complication of anesthesia    "aspiration pneumonia after hand OR" (01/05/2013)  . DIABETES MELLITUS, TYPE II   . GOUT   . HYPERLIPIDEMIA   . HYPERTENSION   . Long term (current) use of anticoagulants   . Morbid obesity ([place])   . OBESITY HYPOVENTILATION SYNDROME    a. on home O2.  . On home oxygen therapy   . OSA (obstructive sleep apnea)   . Permanent atrial fibrillation ([place])   . PROSTATE SPECIFIC ANTIGEN, ELEVATED 06/09/2007  . Pulmonary HTN ([place])    a. multifactorial including obstructive sleep apnea, obesity hypoventilation syndrome and possible pulmonary venous hypertension.  Marland Kitchen SKIN RASH 03/12/2010  . Unspecified hearing loss    Past Surgical History:  Procedure Laterality Date  . APPENDECTOMY  1974  . CARDIOVERSION  05/21/2008; ~ 06/2008  . HERNIA REPAIR    . INGUINAL HERNIA REPAIR Right   . RIGHT HEART CATHETERIZATION N/A 01/08/2013   Procedure: RIGHT HEART CATH;  Surgeon: Thayer Headings, MD;  Location: [facility] CATH LAB;  Service: Cardiovascular;  Laterality: N/A;  . TENDON REPAIR Right 2009   "lacerated his tendon and pulley" Dr. Lenon Curt  . UMBILICAL HERNIA REPAIR  Current Meds  Medication Sig  . allopurinol (ZYLOPRIM) 100 MG tablet TAKE 1 TABLET DAILY  . atorvastatin (LIPITOR) 20 MG tablet TAKE 1 TABLET DAILY  . cetirizine (ZYRTEC) 10 MG tablet Take 10 mg by mouth at bedtime.  . cyanocobalamin (,VITAMIN B-12,) 1000 MCG/ML injection Inject 1,000 mcg into the muscle.  . diltiazem (CARDIZEM CD) 180 MG 24 hr capsule TAKE 1 CAPSULE DAILY (NEED OFFICE VISIT)  . ELIQUIS 5 MG TABS tablet TAKE 1 TABLET  TWICE A DAY  . glipiZIDE (GLUCOTROL XL) 10 MG 24 hr tablet TAKE 1 TABLET DAILY WITH BREAKFAST  . glucose blood (ONE TOUCH ULTRA TEST) test strip Use to check blood sugars once daily Dx E11.9  . Lancets MISC Use as directed once daily  E11.9  . metFORMIN (GLUCOPHAGE-XR) 500 MG 24 hr tablet TAKE 2 TABLETS DAILY WITH BREAKFAST  . metoprolol tartrate (LOPRESSOR) 25 MG tablet TAKE 1 TABLET TWICE A DAY  . Misc Natural Products (TART CHERRY ADVANCED) CAPS Take 1 capsule by mouth daily.  . OXYGEN Inhale 3-5 L into the lungs daily. 3L Daily  4L Resting  5L Exertion  . potassium chloride SA (K-DUR) 20 MEQ tablet TAKE 1 TABLET TWICE A DAY  . torsemide (DEMADEX) 20 MG tablet TAKE 2 TABLETS TWICE A DAY     Allergies:   Codeine and Heparin   Social History   Tobacco Use  . Smoking status: Former Smoker    Packs/day: 1.00    Years: 15.00    Pack years: 15.00    Types: Cigarettes    Quit date: 08/09/1978    Years since quitting: 41.1  . Smokeless tobacco: Never Used  . Tobacco comment: 01/05/2013 "quit smoking ~ 40 years ago"  Substance Use Topics  . Alcohol use: Yes    Alcohol/week: 0.0 standard drinks    Comment: 01/05/2013 "hasn't had a drink since 1980's; never had problem w/it"  . Drug use: No     Family Hx: The patient's family history includes Alzheimer's disease in his father; Cancer in an other family member; Coronary artery disease in an other family member; Prostate cancer in his father.  ROS:   Please see the history of present illness.    No Fever, chills  or productive cough All other systems reviewed and are negative.   Recent Labs: 12/22/2018: ALT 16; Hemoglobin 15.2; Platelets 256.0; TSH 1.38 02/15/2019: BUN 20; Creatinine, Ser 1.11; Potassium 4.1; Sodium 145   Recent Lipid Panel Lab Results  Component Value Date/Time   CHOL 97 12/22/2018 08:30 AM   TRIG 88.0 12/22/2018 08:30 AM   HDL 37.40 (L) 12/22/2018 08:30 AM   CHOLHDL 3 12/22/2018 08:30 AM   LDLCALC 42  12/22/2018 08:30 AM    Wt Readings from Last 3 Encounters:  09/07/19 260 lb 3.2 oz (118 kg)  08/14/19 265 lb (120.2 kg)  07/31/19 263 lb (119.3 kg)     Objective:    Vital Signs:  BP 138/88   Pulse 77   Ht 5\' 8"  (1.727 m)   Wt 260 lb 3.2 oz (118 kg)   BMI 39.56 kg/m    VITAL SIGNS:  reviewed NAD Answers questions appropriately Normal affect Remainder of physical examination not performed (telehealth visit; coronavirus pandemic)  ASSESSMENT & PLAN:    1. Permanent atrial fibrillation-continue present dose of metoprolol and Cardizem for rate control.  Continue apixaban.  Check hemoglobin and renal function. 2. Chronic diastolic congestive heart failure-based on history patient is doing well from a  volume standpoint.  Continue Demadex at present dose.  Continue fluid restriction and low-sodium diet.  We have felt previously that the predominant issue is right-sided heart failure related to obesity hypoventilation syndrome and sleep apnea.  Check potassium and renal function. 3. Hypertension-blood pressure controlled.  Continue present medical regimen. 4. Hyperlipidemia-continue statin.  Check lipids and liver. 5. Morbid obesity-we discussed weight loss. 6. Possible cellulitis-patient was scratched by his dog recently and he states there is some erythema surrounding the area.  I have given him a prescription for Keflex 500 mg twice daily for 7 days.  I have asked him to watch this and if it worsens to see his primary care physician.  COVID-19 Education: The importance of social distancing was discussed today.  Time:   Today, I have spent 16 minutes with the patient with telehealth technology discussing the above problems.     Medication Adjustments/Labs and Tests Ordered: Current medicines are reviewed at length with the patient today.  Concerns regarding medicines are outlined above.   Tests Ordered: No orders of the defined types were placed in this encounter.    Medication Changes: No orders of the defined types were placed in this encounter.   Follow Up:  Either In Person or Virtual in 6 month(s)  Signed, Kirk Ruths, MD  09/07/2019 8:50 AM    [place]

## 2019-09-07 ENCOUNTER — Encounter: Payer: Self-pay | Admitting: Cardiology

## 2019-09-07 ENCOUNTER — Telehealth (INDEPENDENT_AMBULATORY_CARE_PROVIDER_SITE_OTHER): Payer: Medicare Other | Admitting: Cardiology

## 2019-09-07 VITALS — BP 138/88 | HR 77 | Ht 68.0 in | Wt 260.2 lb

## 2019-09-07 DIAGNOSIS — I4821 Permanent atrial fibrillation: Secondary | ICD-10-CM

## 2019-09-07 DIAGNOSIS — E7800 Pure hypercholesterolemia, unspecified: Secondary | ICD-10-CM | POA: Diagnosis not present

## 2019-09-07 DIAGNOSIS — I10 Essential (primary) hypertension: Secondary | ICD-10-CM

## 2019-09-07 DIAGNOSIS — I5032 Chronic diastolic (congestive) heart failure: Secondary | ICD-10-CM

## 2019-09-07 MED ORDER — CEPHALEXIN 500 MG PO CAPS: 500.0000 mg | ORAL_CAPSULE | Freq: Two times a day (BID) | ORAL | 0 refills | Status: DC

## 2019-09-07 NOTE — Patient Instructions (Signed)
Medication Instructions:  TAKE KEFLEX 500 MG TWICE DAILY X 7 DAYS  *If you need a refill on your cardiac medications before your next appointment, please call your pharmacy*  Lab Work: Your physician recommends that you return for lab work PRIOR TO EATING  If you have labs (blood work) drawn today and your tests are completely normal, you will receive your results only by: Marland Kitchen MyChart Message (if you have MyChart) OR . A paper copy in the mail If you have any lab test that is abnormal or we need to change your treatment, we will call you to review the results.  Follow-Up: At [facility], you and your health needs are our priority.  As part of our continuing mission to provide you with exceptional heart care, we have created designated Provider Care Teams.  These Care Teams include your primary Cardiologist (physician) and Advanced Practice Providers (APPs -  Physician Assistants and Nurse Practitioners) who all work together to provide you with the care you need, when you need it.  Your next appointment:   6 month(s)  The format for your next appointment:   Either In Person or Virtual  Provider:   You may see Kirk Ruths, MD or one of the following Advanced Practice Providers on your designated Care Team:    Kerin Ransom, PA-C  [place], [place]  Coletta Memos, [place]

## 2019-09-12 ENCOUNTER — Other Ambulatory Visit: Payer: Self-pay | Admitting: Cardiology

## 2019-09-12 ENCOUNTER — Other Ambulatory Visit: Payer: Self-pay | Admitting: Internal Medicine

## 2019-09-13 ENCOUNTER — Ambulatory Visit (INDEPENDENT_AMBULATORY_CARE_PROVIDER_SITE_OTHER): Payer: Medicare Other | Admitting: *Deleted

## 2019-09-13 ENCOUNTER — Other Ambulatory Visit: Payer: Self-pay

## 2019-09-13 DIAGNOSIS — E538 Deficiency of other specified B group vitamins: Secondary | ICD-10-CM | POA: Diagnosis not present

## 2019-09-13 DIAGNOSIS — I4821 Permanent atrial fibrillation: Secondary | ICD-10-CM | POA: Diagnosis not present

## 2019-09-13 DIAGNOSIS — E7800 Pure hypercholesterolemia, unspecified: Secondary | ICD-10-CM | POA: Diagnosis not present

## 2019-09-13 MED ORDER — CYANOCOBALAMIN 1000 MCG/ML IJ SOLN
1000.0000 ug | Freq: Once | INTRAMUSCULAR | Status: AC
  Administered 2019-09-13: 1000 ug via INTRAMUSCULAR

## 2019-09-13 NOTE — Progress Notes (Signed)
Medical screening examination/treatment/procedure(s) were performed by non-physician practitioner and as supervising physician I was immediately available for consultation/collaboration. I agree with above. Damyan Corne, MD   

## 2019-09-14 LAB — COMPREHENSIVE METABOLIC PANEL
ALT: 13 IU/L (ref 0–44)
AST: 16 IU/L (ref 0–40)
Albumin/Globulin Ratio: 1.3 (ref 1.2–2.2)
Albumin: 4 g/dL (ref 3.7–4.7)
Alkaline Phosphatase: 92 IU/L (ref 39–117)
BUN/Creatinine Ratio: 19 (ref 10–24)
BUN: 20 mg/dL (ref 8–27)
Bilirubin Total: 1.3 mg/dL — ABNORMAL HIGH (ref 0.0–1.2)
CO2: 29 mmol/L (ref 20–29)
Calcium: 9.9 mg/dL (ref 8.6–10.2)
Chloride: 102 mmol/L (ref 96–106)
Creatinine, Ser: 1.05 mg/dL (ref 0.76–1.27)
GFR calc Af Amer: 81 mL/min/{1.73_m2} (ref >59)
GFR calc non Af Amer: 70 mL/min/{1.73_m2} (ref >59)
Globulin, Total: 3 g/dL (ref 1.5–4.5)
Glucose: 83 mg/dL (ref 65–99)
Potassium: 4.7 mmol/L (ref 3.5–5.2)
Sodium: 146 mmol/L — ABNORMAL HIGH (ref 134–144)
Total Protein: 7 g/dL (ref 6.0–8.5)

## 2019-09-14 LAB — LIPID PANEL
Chol/HDL Ratio: 2.6 ratio (ref 0.0–5.0)
Cholesterol, Total: 114 mg/dL (ref 100–199)
HDL: 44 mg/dL (ref >39)
LDL Chol Calc (NIH): 55 mg/dL (ref 0–99)
Triglycerides: 75 mg/dL (ref 0–149)
VLDL Cholesterol Cal: 15 mg/dL (ref 5–40)

## 2019-09-14 LAB — CBC
Hematocrit: 48 % (ref 37.5–51.0)
Hemoglobin: 16.1 g/dL (ref 13.0–17.7)
MCH: 28.4 pg (ref 26.6–33.0)
MCHC: 33.5 g/dL (ref 31.5–35.7)
MCV: 85 fL (ref 79–97)
Platelets: 271 10*3/uL (ref 150–450)
RBC: 5.67 x10E6/uL (ref 4.14–5.80)
RDW: 13.7 % (ref 11.6–15.4)
WBC: 10.4 10*3/uL (ref 3.4–10.8)

## 2019-09-23 ENCOUNTER — Other Ambulatory Visit: Payer: Self-pay | Admitting: Student

## 2019-09-24 NOTE — Telephone Encounter (Signed)
This is Dr. Crenshaw's pt 

## 2019-09-28 ENCOUNTER — Ambulatory Visit: Payer: Medicare Other | Admitting: Family Medicine

## 2019-10-03 ENCOUNTER — Other Ambulatory Visit: Payer: Self-pay

## 2019-10-03 ENCOUNTER — Encounter: Payer: Self-pay | Admitting: Internal Medicine

## 2019-10-03 ENCOUNTER — Ambulatory Visit (INDEPENDENT_AMBULATORY_CARE_PROVIDER_SITE_OTHER): Payer: Medicare Other | Admitting: Internal Medicine

## 2019-10-03 DIAGNOSIS — E119 Type 2 diabetes mellitus without complications: Secondary | ICD-10-CM

## 2019-10-03 DIAGNOSIS — I10 Essential (primary) hypertension: Secondary | ICD-10-CM | POA: Diagnosis not present

## 2019-10-03 DIAGNOSIS — E7800 Pure hypercholesterolemia, unspecified: Secondary | ICD-10-CM | POA: Diagnosis not present

## 2019-10-03 MED ORDER — DILTIAZEM HCL ER COATED BEADS 180 MG PO CP24: ORAL_CAPSULE | ORAL | 3 refills | Status: DC

## 2019-10-03 MED ORDER — GLIPIZIDE ER 10 MG PO TB24: 10.0000 mg | ORAL_TABLET | Freq: Every day | ORAL | 3 refills | Status: DC

## 2019-10-03 MED ORDER — ALLOPURINOL 100 MG PO TABS: 100.0000 mg | ORAL_TABLET | Freq: Every day | ORAL | 3 refills | Status: DC

## 2019-10-03 MED ORDER — METFORMIN HCL ER 500 MG PO TB24: ORAL_TABLET | ORAL | 3 refills | Status: DC

## 2019-10-03 MED ORDER — ATORVASTATIN CALCIUM 20 MG PO TABS: 20.0000 mg | ORAL_TABLET | Freq: Every day | ORAL | 3 refills | Status: DC

## 2019-10-03 MED ORDER — METOPROLOL TARTRATE 25 MG PO TABS: 25.0000 mg | ORAL_TABLET | Freq: Two times a day (BID) | ORAL | 3 refills | Status: DC

## 2019-10-03 MED ORDER — POTASSIUM CHLORIDE CRYS ER 20 MEQ PO TBCR: 20.0000 meq | EXTENDED_RELEASE_TABLET | Freq: Two times a day (BID) | ORAL | 3 refills | Status: DC

## 2019-10-03 NOTE — Assessment & Plan Note (Signed)
stable overall by history and exam, recent data reviewed with pt, and pt to continue medical treatment as before,  to f/u any worsening symptoms or concerns  

## 2019-10-03 NOTE — Patient Instructions (Addendum)
You are on the [facility] Vaccine Wait list  Your A1c was OK today  Please continue all other medications as before, and refills have been done if requested (except for the ones Dr [name] prescribes)  Please have the pharmacy call with any other refills you may need.  Please continue your efforts at being more active, low cholesterol diet, and weight control.  You are otherwise up to date with prevention measures today.  Please keep your appointments with your specialists as you may have planned  No other blood work needed today  Please make an Appointment to return in 6 months, or sooner if needed

## 2019-10-03 NOTE — Progress Notes (Signed)
Subjective:    Patient ID: [name] [name], male    DOB: 16-Sep-1945, 74 y.o.   MRN: [id number]  HPI  Here to f/u; overall doing ok,  Pt denies chest pain, increasing sob or doe, wheezing, orthopnea, PND, increased LE swelling, palpitations, dizziness or syncope.  Pt denies new neurological symptoms such as new headache, or facial or extremity weakness or numbness.  Pt denies polydipsia, polyuria, or low sugar episode.  Pt states overall good compliance with meds, mostly trying to follow appropriate diet, with wt overall stable,  but little exercise however.  Wife now permanent SNF with rare autoimmune myclonus related illness, still grieving for her impairment. Plans to call for eye doctor appt.  Wt Readings from Last 3 Encounters:  10/03/19 262 lb 9.6 oz (119.1 kg)  09/07/19 260 lb 3.2 oz (118 kg)  08/14/19 265 lb (120.2 kg)  On Home 4L continuous, using Bipap qhs with good complaints.  Now new compliants  Needs to be on [facility] Vaccine wait list Past Medical History:  Diagnosis Date  . Acute right-sided CHF (congestive heart failure) ([place])    a. 01/2013.  . Arthritis    "knees and hands; thoracic area of the spine" (01/05/2013)  . BRONCHITIS, CHRONIC 01/02/2009  . COMMON MIGRAINE    "none since treating for high BP"  . Complication of anesthesia    "aspiration pneumonia after hand OR" (01/05/2013)  . DIABETES MELLITUS, TYPE II   . GOUT   . HYPERLIPIDEMIA   . HYPERTENSION   . Long term (current) use of anticoagulants   . Morbid obesity ([place])   . OBESITY HYPOVENTILATION SYNDROME    a. on home O2.  . On home oxygen therapy   . OSA (obstructive sleep apnea)   . Permanent atrial fibrillation ([place])   . PROSTATE SPECIFIC ANTIGEN, ELEVATED 06/09/2007  . Pulmonary HTN ([place])    a. multifactorial including obstructive sleep apnea, obesity hypoventilation syndrome and possible pulmonary venous hypertension.  Marland Kitchen SKIN RASH 03/12/2010  . Unspecified hearing loss    Past Surgical History:  Procedure  Laterality Date  . APPENDECTOMY  1974  . CARDIOVERSION  05/21/2008; ~ 06/2008  . HERNIA REPAIR    . INGUINAL HERNIA REPAIR Right   . RIGHT HEART CATHETERIZATION N/A 01/08/2013   Procedure: RIGHT HEART CATH;  Surgeon: Thayer Headings, MD;  Location: [facility] CATH LAB;  Service: Cardiovascular;  Laterality: N/A;  . TENDON REPAIR Right 2009   "lacerated his tendon and pulley" Dr. Lenon Curt  . UMBILICAL HERNIA REPAIR      reports that he quit smoking about 41 years ago. His smoking use included cigarettes. He has a 15.00 pack-year smoking history. He has never used smokeless tobacco. He reports current alcohol use. He reports that he does not use drugs. family history includes Alzheimer's disease in his father; Cancer in an other family member; Coronary artery disease in an other family member; Prostate cancer in his father. Allergies  Allergen Reactions  . Codeine     Altered mental status "goofy"  . Heparin Rash     Abdominal rash 01/2013   Current Outpatient Medications on File Prior to Visit  Medication Sig Dispense Refill  . allopurinol (ZYLOPRIM) 100 MG tablet TAKE 1 TABLET DAILY 90 tablet 1  . atorvastatin (LIPITOR) 20 MG tablet TAKE 1 TABLET DAILY 90 tablet 2  . cetirizine (ZYRTEC) 10 MG tablet Take 10 mg by mouth at bedtime.    . cyanocobalamin (,VITAMIN B-12,) 1000 MCG/ML injection Inject  1,000 mcg into the muscle.    . diltiazem (CARDIZEM CD) 180 MG 24 hr capsule TAKE 1 CAPSULE DAILY (NEED OFFICE VISIT) 90 capsule 1  . ELIQUIS 5 MG TABS tablet TAKE 1 TABLET TWICE A DAY 180 tablet 3  . glipiZIDE (GLUCOTROL XL) 10 MG 24 hr tablet Take 1 tablet (10 mg total) by mouth daily with breakfast. Annual appt due in May must see provider for future refills 90 tablet 0  . glucose blood (ONE TOUCH ULTRA TEST) test strip Use to check blood sugars once daily Dx E11.9 100 each 2  . Lancets MISC Use as directed once daily  E11.9 100 each 12  . metFORMIN (GLUCOPHAGE-XR) 500 MG 24 hr tablet TAKE 2 TABLETS DAILY  WITH BREAKFAST 180 tablet 1  . metoprolol tartrate (LOPRESSOR) 25 MG tablet TAKE 1 TABLET TWICE A DAY 180 tablet 2  . Misc Natural Products (TART CHERRY ADVANCED) CAPS Take 1 capsule by mouth daily.    . OXYGEN Inhale 3-5 L into the lungs daily. 3L Daily  4L Resting  5L Exertion    . potassium chloride SA (KLOR-CON) 20 MEQ tablet TAKE 1 TABLET TWICE A DAY 180 tablet 3  . torsemide (DEMADEX) 20 MG tablet TAKE 2 TABLETS TWICE A DAY 60 tablet 0   No current facility-administered medications on file prior to visit.   Review of Systems All otherwise neg per pt     Objective:   Physical Exam BP 134/82   Pulse 78   Temp 98.7 F (37.1 C)   Wt 262 lb 9.6 oz (119.1 kg)   SpO2 98%   BMI 39.93 kg/m  VS noted,  Constitutional: Pt appears in NAD HENT: Head: NCAT.  Right Ear: External ear normal.  Left Ear: External ear normal.  Eyes: . Pupils are equal, round, and reactive to light. Conjunctivae and EOM are normal Nose: without d/c or deformity Neck: Neck supple. Gross normal ROM Cardiovascular: Normal rate and regular rhythm.   Pulmonary/Chest: Effort normal and breath sounds without rales or wheezing.  Abd:  Soft, NT, ND, + BS, no organomegaly Neurological: Pt is alert. At baseline orientation, motor grossly intact Skin: Skin is warm. No rashes, other new lesions, 1+ chronic bilat LE edema Psychiatric: Pt behavior is normal without agitation  All otherwise neg per pt Lab Results  Component Value Date   WBC 10.4 09/13/2019   HGB 16.1 09/13/2019   HCT 48.0 09/13/2019   PLT 271 09/13/2019   GLUCOSE 83 09/13/2019   CHOL 114 09/13/2019   TRIG 75 09/13/2019   HDL 44 09/13/2019   LDLCALC 55 09/13/2019   ALT 13 09/13/2019   AST 16 09/13/2019   NA 146 (H) 09/13/2019   K 4.7 09/13/2019   CL 102 09/13/2019   CREATININE 1.05 09/13/2019   BUN 20 09/13/2019   CO2 29 09/13/2019   TSH 1.38 12/22/2018   PSA 1.28 12/22/2018   INR 1.25 01/09/2013   HGBA1C 6.0 12/22/2018   MICROALBUR  4.3 (H) 12/22/2018         Assessment & Plan:

## 2019-10-03 NOTE — Assessment & Plan Note (Addendum)
stable overall by history and exam, recent data reviewed with pt, and pt to continue medical treatment as before,  to f/u any worsening symptoms or concerns, for poct a1c today  I spent 31 minutes in addition to time for wellness examination in preparing to see the patient by review of recent labs, imaging and procedures, obtaining and reviewing separately obtained history, communicating with the patient and family or caregiver, ordering medications, tests or procedures, and documenting clinical information in the EHR including the differential Dx, treatment, and any further evaluation and other management of DM, HTN, HLD and wellness

## 2019-10-07 ENCOUNTER — Ambulatory Visit: Payer: Medicare Other | Attending: Internal Medicine

## 2019-10-07 DIAGNOSIS — Z23 Encounter for immunization: Secondary | ICD-10-CM

## 2019-10-07 NOTE — Progress Notes (Signed)
   Covid-19 Vaccination Clinic  Name:  [name] [name]    MRN: [id number] DOB: 1945-12-11  10/07/2019  [name] was observed post Covid-19 immunization for 15 minutes without incidence. He was provided with Vaccine Information Sheet and instruction to access the V-Safe system.   [name] was instructed to call 911 with any severe reactions post vaccine: Marland Kitchen Difficulty breathing  . Swelling of your face and throat  . A fast heartbeat  . A bad rash all over your body  . Dizziness and weakness    Immunizations Administered    Name Date Dose VIS Date Route   Pfizer COVID-19 Vaccine 10/07/2019  9:11 AM 0.3 mL 07/20/2019 Intramuscular   Manufacturer: [place]   Lot: [id number]   [place]: [id number]

## 2019-10-12 ENCOUNTER — Ambulatory Visit (INDEPENDENT_AMBULATORY_CARE_PROVIDER_SITE_OTHER): Payer: Medicare Other | Admitting: *Deleted

## 2019-10-12 ENCOUNTER — Other Ambulatory Visit: Payer: Self-pay

## 2019-10-12 DIAGNOSIS — E538 Deficiency of other specified B group vitamins: Secondary | ICD-10-CM

## 2019-10-12 MED ORDER — CYANOCOBALAMIN 1000 MCG/ML IJ SOLN
1000.0000 ug | Freq: Once | INTRAMUSCULAR | Status: AC
  Administered 2019-10-12: 1000 ug via INTRAMUSCULAR

## 2019-10-12 NOTE — Progress Notes (Signed)
Pls cosign for B12 inj../lmb  

## 2019-10-31 ENCOUNTER — Ambulatory Visit: Payer: Medicare Other | Attending: Internal Medicine

## 2019-10-31 DIAGNOSIS — Z23 Encounter for immunization: Secondary | ICD-10-CM

## 2019-10-31 NOTE — Progress Notes (Signed)
   Covid-19 Vaccination Clinic  Name:  [name] [name]    MRN: [id number] DOB: 1946/02/14  10/31/2019  [name] was observed post Covid-19 immunization for 15 minutes without incident. He was provided with Vaccine Information Sheet and instruction to access the V-Safe system.   [name] was instructed to call 911 with any severe reactions post vaccine: Marland Kitchen Difficulty breathing  . Swelling of face and throat  . A fast heartbeat  . A bad rash all over body  . Dizziness and weakness   Immunizations Administered    Name Date Dose VIS Date Route   Pfizer COVID-19 Vaccine 10/31/2019 11:23 AM 0.3 mL 07/20/2019 Intramuscular   Manufacturer: [place]   Lot: [id number]   [place]: [id number]

## 2019-11-12 ENCOUNTER — Ambulatory Visit (INDEPENDENT_AMBULATORY_CARE_PROVIDER_SITE_OTHER): Payer: Medicare Other

## 2019-11-12 ENCOUNTER — Other Ambulatory Visit: Payer: Self-pay

## 2019-11-12 DIAGNOSIS — E538 Deficiency of other specified B group vitamins: Secondary | ICD-10-CM | POA: Diagnosis not present

## 2019-11-12 MED ORDER — CYANOCOBALAMIN 1000 MCG/ML IJ SOLN
1000.0000 ug | Freq: Once | INTRAMUSCULAR | Status: AC
  Administered 2019-11-12: 1000 ug via INTRAMUSCULAR

## 2019-11-12 NOTE — Progress Notes (Signed)
Medical screening examination/treatment/procedure(s) were performed by non-physician practitioner and as supervising physician I was immediately available for consultation/collaboration. I agree with above. Ebrahim Deremer, MD   

## 2019-12-02 ENCOUNTER — Other Ambulatory Visit: Payer: Self-pay | Admitting: Cardiology

## 2019-12-13 ENCOUNTER — Ambulatory Visit (INDEPENDENT_AMBULATORY_CARE_PROVIDER_SITE_OTHER): Payer: Medicare Other | Admitting: *Deleted

## 2019-12-13 ENCOUNTER — Other Ambulatory Visit: Payer: Self-pay

## 2019-12-13 DIAGNOSIS — E538 Deficiency of other specified B group vitamins: Secondary | ICD-10-CM | POA: Diagnosis not present

## 2019-12-13 MED ORDER — CYANOCOBALAMIN 1000 MCG/ML IJ SOLN
1000.0000 ug | Freq: Once | INTRAMUSCULAR | Status: AC
  Administered 2019-12-13: 1000 ug via INTRAMUSCULAR

## 2019-12-13 NOTE — Progress Notes (Signed)
Pls cosign for B12 inj../lmb  

## 2019-12-17 NOTE — Progress Notes (Signed)
Patient ID: [name] [name], male   DOB: 04/03/1946, 74 y.o.   MRN: [id number]  Medical screening examination/treatment/procedure(s) were performed by non-physician practitioner and as supervising physician I was immediately available for consultation/collaboration. I agree with above. Vangie Henthorn, MD   

## 2019-12-18 ENCOUNTER — Ambulatory Visit (INDEPENDENT_AMBULATORY_CARE_PROVIDER_SITE_OTHER): Payer: Medicare Other | Admitting: Podiatry

## 2019-12-18 ENCOUNTER — Other Ambulatory Visit: Payer: Self-pay

## 2019-12-18 ENCOUNTER — Encounter: Payer: Self-pay | Admitting: Podiatry

## 2019-12-18 VITALS — BP 146/99 | HR 79 | Temp 96.4°F

## 2019-12-18 DIAGNOSIS — M2041 Other hammer toe(s) (acquired), right foot: Secondary | ICD-10-CM

## 2019-12-18 DIAGNOSIS — B351 Tinea unguium: Secondary | ICD-10-CM

## 2019-12-18 DIAGNOSIS — M79674 Pain in right toe(s): Secondary | ICD-10-CM

## 2019-12-18 DIAGNOSIS — E119 Type 2 diabetes mellitus without complications: Secondary | ICD-10-CM | POA: Diagnosis not present

## 2019-12-18 DIAGNOSIS — M2042 Other hammer toe(s) (acquired), left foot: Secondary | ICD-10-CM

## 2019-12-18 DIAGNOSIS — M79675 Pain in left toe(s): Secondary | ICD-10-CM | POA: Diagnosis not present

## 2019-12-18 DIAGNOSIS — M2142 Flat foot [pes planus] (acquired), left foot: Secondary | ICD-10-CM

## 2019-12-18 DIAGNOSIS — M2141 Flat foot [pes planus] (acquired), right foot: Secondary | ICD-10-CM

## 2019-12-18 NOTE — Patient Instructions (Addendum)
Edema  Edema is an abnormal buildup of fluids in the body tissues and under the skin. Swelling of the legs, feet, and ankles is a common symptom that becomes more likely as you get older. Swelling is also common in looser tissues, like around the eyes. When the affected area is squeezed, the fluid may move out of that spot and leave a dent for a few moments. This dent is called pitting edema. There are many possible causes of edema. Eating too much salt (sodium) and being on your feet or sitting for a long time can cause edema in your legs, feet, and ankles. Hot weather may make edema worse. Common causes of edema include:  Heart failure.  Liver or kidney disease.  Weak leg blood vessels.  Cancer.  An injury.  Pregnancy.  Medicines.  Being obese.  Low protein levels in the blood. Edema is usually painless. Your skin may look swollen or shiny. Follow these instructions at home:  Keep the affected body part raised (elevated) above the level of your heart when you are sitting or lying down.  Do not sit still or stand for long periods of time.  Do not wear tight clothing. Do not wear garters on your upper legs.  Exercise your legs to get your circulation going. This helps to move the fluid back into your blood vessels, and it may help the swelling go down.  Wear elastic bandages or support stockings to reduce swelling as told by your health care provider.  Eat a low-salt (low-sodium) diet to reduce fluid as told by your health care provider.  Depending on the cause of your swelling, you may need to limit how much fluid you drink (fluid restriction).  Take over-the-counter and prescription medicines only as told by your health care provider. Contact a health care provider if:  Your edema does not get better with treatment.  You have heart, liver, or kidney disease and have symptoms of edema.  You have sudden and unexplained weight gain. Get help right away if:  You develop  shortness of breath or chest pain.  You cannot breathe when you lie down.  You develop pain, redness, or warmth in the swollen areas.  You have heart, liver, or kidney disease and suddenly get edema.  You have a fever and your symptoms suddenly get worse. Summary  Edema is an abnormal buildup of fluids in the body tissues and under the skin.  Eating too much salt (sodium) and being on your feet or sitting for a long time can cause edema in your legs, feet, and ankles.  Keep the affected body part raised (elevated) above the level of your heart when you are sitting or lying down. This information is not intended to replace advice given to you by your health care provider. Make sure you discuss any questions you have with your health care provider. Document Revised: 12/13/2018 Document Reviewed: 08/28/2016 Elsevier Patient Education  [place]  A bunion is a bump on the base of the big toe that forms when the bones of the big toe joint move out of position. Bunions may be small at first, but they often get larger over time. They can make walking painful. What are the causes? A bunion may be caused by:  Wearing narrow or pointed shoes that force the big toe to press against the other toes.  Abnormal foot development that causes the foot to roll inward (pronate).  Changes in the foot that are  caused by certain diseases, such as rheumatoid arthritis or polio.  A foot injury. What increases the risk? The following factors may make you more likely to develop this condition:  Wearing shoes that squeeze the toes together.  Having certain diseases, such as: ? Rheumatoid arthritis. ? Polio. ? Cerebral palsy.  Having family members who have bunions.  Being born with a foot deformity, such as flat feet or low arches.  Doing activities that put a lot of pressure on the feet, such as ballet dancing. What are the signs or symptoms? The main symptom of a bunion is a  noticeable bump on the big toe. Other symptoms may include:  Pain.  Swelling around the big toe.  Redness and inflammation.  Thick or hardened skin on the big toe or between the toes.  Stiffness or loss of motion in the big toe.  Trouble with walking. How is this diagnosed? A bunion may be diagnosed based on your symptoms, medical history, and activities. You may have tests, such as:  X-rays. These allow your health care provider to check the position of the bones in your foot and look for damage to your joint. They also help your health care provider determine the severity of your bunion and the best way to treat it.  Joint aspiration. In this test, a sample of fluid is removed from the toe joint. This test may be done if you are in a lot of pain. It helps rule out diseases that cause painful swelling of the joints, such as arthritis. How is this treated? Treatment depends on the severity of your symptoms. The goal of treatment is to relieve symptoms and prevent the bunion from getting worse. Your health care provider may recommend:  Wearing shoes that have a wide toe box.  Using bunion pads to cushion the affected area.  Taping your toes together to keep them in a normal position.  Placing a device inside your shoe (orthotics) to help reduce pressure on your toe joint.  Taking medicine to ease pain, inflammation, and swelling.  Applying heat or ice to the affected area.  Doing stretching exercises.  Surgery to remove scar tissue and move the toes back into their normal position. This treatment is rare. Follow these instructions at home: Managing pain, stiffness, and swelling   If directed, put ice on the painful area: ? Put ice in a plastic bag. ? Place a towel between your skin and the bag. ? Leave the ice on for 20 minutes, 2-3 times a day. Activity   If directed, apply heat to the affected area before you exercise. Use the heat source that your health care  provider recommends, such as a moist heat pack or a heating pad. ? Place a towel between your skin and the heat source. ? Leave the heat on for 20-30 minutes. ? Remove the heat if your skin turns bright red. This is especially important if you are unable to feel pain, heat, or cold. You may have a greater risk of getting burned.  Do exercises as told by your health care provider. General instructions  Support your toe joint with proper footwear, shoe padding, or taping as told by your health care provider.  Take over-the-counter and prescription medicines only as told by your health care provider.  Keep all follow-up visits as told by your health care provider. This is important. Contact a health care provider if your symptoms:  Get worse.  Do not improve in 2 weeks. Get  help right away if you have:  Severe pain and trouble with walking. Summary  A bunion is a bump on the base of the big toe that forms when the bones of the big toe joint move out of position.  Bunions can make walking painful.  Treatment depends on the severity of your symptoms.  Support your toe joint with proper footwear, shoe padding, or taping as told by your health care provider. This information is not intended to replace advice given to you by your health care provider. Make sure you discuss any questions you have with your health care provider. Document Revised: 01/30/2018 Document Reviewed: 12/06/2017 Elsevier Patient Education  [place].   Diabetes Mellitus and [place] care is an important part of your health, especially when you have diabetes. Diabetes may cause you to have problems because of poor blood flow (circulation) to your feet and legs, which can cause your skin to:  Become thinner and drier.  Break more easily.  Heal more slowly.  Peel and crack. You may also have nerve damage (neuropathy) in your legs and feet, causing decreased feeling in them. This means that you  may not notice minor injuries to your feet that could lead to more serious problems. Noticing and addressing any potential problems early is the best way to prevent future foot problems. How to care for your feet Foot hygiene  Wash your feet daily with warm water and mild soap. Do not use hot water. Then, pat your feet and the areas between your toes until they are completely dry. Do not soak your feet as this can dry your skin.  Trim your toenails straight across. Do not dig under them or around the cuticle. File the edges of your nails with an emery board or nail file.  Apply a moisturizing lotion or petroleum jelly to the skin on your feet and to dry, brittle toenails. Use lotion that does not contain alcohol and is unscented. Do not apply lotion between your toes. Shoes and socks  Wear clean socks or stockings every day. Make sure they are not too tight. Do not wear knee-high stockings since they may decrease blood flow to your legs.  Wear shoes that fit properly and have enough cushioning. Always look in your shoes before you put them on to be sure there are no objects inside.  To break in new shoes, wear them for just a few hours a day. This prevents injuries on your feet. Wounds, scrapes, corns, and calluses  Check your feet daily for blisters, cuts, bruises, sores, and redness. If you cannot see the bottom of your feet, use a mirror or ask someone for help.  Do not cut corns or calluses or try to remove them with medicine.  If you find a minor scrape, cut, or break in the skin on your feet, keep it and the skin around it clean and dry. You may clean these areas with mild soap and water. Do not clean the area with peroxide, alcohol, or iodine.  If you have a wound, scrape, corn, or callus on your foot, look at it several times a day to make sure it is healing and not infected. Check for: ? Redness, swelling, or pain. ? Fluid or blood. ? Warmth. ? Pus or a bad smell. General  instructions  Do not cross your legs. This may decrease blood flow to your feet.  Do not use heating pads or hot water bottles on your feet. They  may burn your skin. If you have lost feeling in your feet or legs, you may not know this is happening until it is too late.  Protect your feet from hot and cold by wearing shoes, such as at the beach or on hot pavement.  Schedule a complete foot exam at least once a year (annually) or more often if you have foot problems. If you have foot problems, report any cuts, sores, or bruises to your health care provider immediately. Contact a health care provider if:  You have a medical condition that increases your risk of infection and you have any cuts, sores, or bruises on your feet.  You have an injury that is not healing.  You have redness on your legs or feet.  You feel burning or tingling in your legs or feet.  You have pain or cramps in your legs and feet.  Your legs or feet are numb.  Your feet always feel cold.  You have pain around a toenail. Get help right away if:  You have a wound, scrape, corn, or callus on your foot and: ? You have pain, swelling, or redness that gets worse. ? You have fluid or blood coming from the wound, scrape, corn, or callus. ? Your wound, scrape, corn, or callus feels warm to the touch. ? You have pus or a bad smell coming from the wound, scrape, corn, or callus. ? You have a fever. ? You have a red line going up your leg. Summary  Check your feet every day for cuts, sores, red spots, swelling, and blisters.  Moisturize feet and legs daily.  Wear shoes that fit properly and have enough cushioning.  If you have foot problems, report any cuts, sores, or bruises to your health care provider immediately.  Schedule a complete foot exam at least once a year (annually) or more often if you have foot problems. This information is not intended to replace advice given to you by your health care provider.  Make sure you discuss any questions you have with your health care provider. Document Revised: 04/18/2019 Document Reviewed: 08/27/2016 Elsevier Patient Education  [place].

## 2019-12-27 NOTE — Progress Notes (Signed)
Subjective: [name] [name] presents today referred by [name], MD for diabetic foot evaluation.  Patient relates 10 year history of diabetes.  Patient denies any history of foot wounds.  Patient admits occasional numbness in feet.  Patient denies any history of  tingling, burning, pins/needles sensations.  Today, patient c/o of painful, discolored, thick toenails for the past several months which interfere with daily activities.  Pain is aggravated when wearing enclosed shoe gear. He has not attempted treatment due to use of blood thinners.  Patient's daughter is present during today's visit.  Past Medical History:  Diagnosis Date  . Acute right-sided CHF (congestive heart failure) ([place])    a. 01/2013.  . Arthritis    "knees and hands; thoracic area of the spine" (01/05/2013)  . BRONCHITIS, CHRONIC 01/02/2009  . COMMON MIGRAINE    "none since treating for high BP"  . Complication of anesthesia    "aspiration pneumonia after hand OR" (01/05/2013)  . DIABETES MELLITUS, TYPE II   . GOUT   . HYPERLIPIDEMIA   . HYPERTENSION   . Long term (current) use of anticoagulants   . Morbid obesity ([place])   . OBESITY HYPOVENTILATION SYNDROME    a. on home O2.  . On home oxygen therapy   . OSA (obstructive sleep apnea)   . Permanent atrial fibrillation ([place])   . PROSTATE SPECIFIC ANTIGEN, ELEVATED 06/09/2007  . Pulmonary HTN ([place])    a. multifactorial including obstructive sleep apnea, obesity hypoventilation syndrome and possible pulmonary venous hypertension.  Marland Kitchen SKIN RASH 03/12/2010  . Unspecified hearing loss     Patient Active Problem List   Diagnosis Date Noted  . Greater trochanteric bursitis, right 08/14/2019  . Tick bite, infected 12/19/2018  . Degenerative arthritis of right knee 02/21/2017  . Right knee pain 02/15/2017  . Volume overload 12/23/2015  . OSA treated with BiPAP 12/23/2015  . Pulmonary hypertension ([place]) 12/23/2015  . Gout 12/23/2015  . Cellulitis of leg, right  02/14/2015  . Allergic rhinitis, cause unspecified 11/21/2013  . Chronic cor pulmonale ([place]) 12/22/2012  . DOE (dyspnea on exertion) 12/20/2012  . Left knee pain 07/11/2012  . Increased prostate specific antigen (PSA) velocity 01/06/2012  . Preventative health care 12/25/2010  . Long term (current) use of anticoagulants 11/06/2010  . HEMATOCHEZIA 06/26/2010  . BRONCHITIS, CHRONIC 01/02/2009  . Obesity hypoventilation syndrome ([place]) 11/26/2008  . Permanent atrial fibrillation 10/16/2008  . PERIPHERAL EDEMA 01/05/2008  . Non-insulin dependent type 2 diabetes mellitus ([place]) 06/09/2007  . COMMON MIGRAINE 06/09/2007  . Unspecified hearing loss 06/09/2007  . PROSTATE SPECIFIC ANTIGEN, ELEVATED 06/09/2007  . Hyperlipidemia 02/18/2007  . Severe obesity (BMI >= 40) ([place]) 02/18/2007  . Essential hypertension 02/18/2007    Past Surgical History:  Procedure Laterality Date  . APPENDECTOMY  1974  . CARDIOVERSION  05/21/2008; ~ 06/2008  . HERNIA REPAIR    . INGUINAL HERNIA REPAIR Right   . RIGHT HEART CATHETERIZATION N/A 01/08/2013   Procedure: RIGHT HEART CATH;  Surgeon: Thayer Headings, MD;  Location: [facility] C[facility] LAB;  Service: Cardiovascular;  Laterality: N/A;  . TENDON REPAIR Right 2009   "lacerated his tendon and pulley" Dr. Lenon Curt  . UMBILICAL HERNIA REPAIR      Current Outpatient Medications on File Prior to Visit  Medication Sig Dispense Refill  . allopurinol (ZYLOPRIM) 100 MG tablet Take 1 tablet (100 mg total) by mouth daily. 90 tablet 3  . atorvastatin (LIPITOR) 20 MG tablet Take 1 tablet (20 mg total)  by mouth daily. 90 tablet 3  . cetirizine (ZYRTEC) 10 MG tablet Take 10 mg by mouth at bedtime.    . cyanocobalamin (,VITAMIN B-12,) 1000 MCG/ML injection Inject 1,000 mcg into the muscle.    . diltiazem (CARDIZEM CD) 180 MG 24 hr capsule TAKE 1 CAPSULE DAILY (NEED OFFICE VISIT) 90 capsule 3  . ELIQUIS 5 MG TABS tablet TAKE 1 TABLET TWICE A DAY 180 tablet 3  . glipiZIDE (GLUCOTROL XL)  10 MG 24 hr tablet Take 1 tablet (10 mg total) by mouth daily with breakfast. 90 tablet 3  . glucose blood (ONE TOUCH ULTRA TEST) test strip Use to check blood sugars once daily Dx E11.9 100 each 2  . Lancets MISC Use as directed once daily  E11.9 100 each 12  . metFORMIN (GLUCOPHAGE-XR) 500 MG 24 hr tablet 2 tabs by mouth in the AM 180 tablet 3  . metoprolol tartrate (LOPRESSOR) 25 MG tablet Take 1 tablet (25 mg total) by mouth 2 (two) times daily. 180 tablet 3  . Misc Natural Products (TART CHERRY ADVANCED) CAPS Take 1 capsule by mouth daily.    . OXYGEN Inhale 3-5 L into the lungs daily. 3L Daily  4L Resting  5L Exertion    . potassium chloride SA (KLOR-CON) 20 MEQ tablet Take 1 tablet (20 mEq total) by mouth 2 (two) times daily. 180 tablet 3  . torsemide (DEMADEX) 20 MG tablet TAKE 2 TABLETS TWICE A DAY (PLEASE CONTACT OFFICE FOR ADDITIONAL REFILLS) 360 tablet 3   No current facility-administered medications on file prior to visit.     Allergies  Allergen Reactions  . Codeine     Altered mental status "goofy"  . Heparin Rash     Abdominal rash 01/2013    Social History   Occupational History  . Occupation: Quarry manager: [employer]  Tobacco Use  . Smoking status: Former Smoker    Packs/day: 1.00    Years: 15.00    Pack years: 15.00    Types: Cigarettes    Quit date: 08/09/1978    Years since quitting: 41.4  . Smokeless tobacco: Never Used  . Tobacco comment: 01/05/2013 "quit smoking ~ 40 years ago"  Substance and Sexual Activity  . Alcohol use: Yes    Alcohol/week: 0.0 standard drinks    Comment: 01/05/2013 "hasn't had a drink since 1980's; never had problem w/it"  . Drug use: No  . Sexual activity: Not Currently    Family History  Problem Relation Age of Onset  . Alzheimer's disease Father   . Prostate cancer Father   . Cancer Other        Breast Cancer, <50 yo 1st degree relative  . Coronary artery disease Other        Male, 1st degree  relative    Immunization History  Administered Date(s) Administered  . Fluad Quad(high Dose 65+) 05/10/2019  . H1N1 07/15/2008  . Influenza Split 05/17/2011, 06/09/2012  . Influenza Whole 05/09/2008, 06/24/2009, 06/26/2010  . Influenza, High Dose Seasonal PF 04/15/2015, 04/19/2017, 04/25/2018  . Influenza,inj,Quad PF,6+ Mos 04/05/2013, 04/11/2014, 04/13/2016  . PFIZER SARS-COV-2 Vaccination 10/07/2019, 10/31/2019  . Pneumococcal Conjugate-13 06/01/2013, 10/09/2013  . Pneumococcal Polysaccharide-23 02/07/2008, 10/19/2017  . Td 02/07/2008  . Tdap 04/25/2018  . Zoster 07/02/2008    Review of systems: Positive Findings in bold print.  Constitutional:  chills, fatigue, fever, sweats, weight change Communication: Optometrist, sign Ecologist, hand writing, iPad/Android device Head: headaches, head injury Eyes:  changes in vision, eye pain, glaucoma, cataracts, macular degeneration, diplopia, glare,  light sensitivity, eyeglasses or contacts, blindness Ears nose mouth throat: hearing impaired, hearing aids,  ringing in ears, deaf, sign language,  vertigo, nosebleeds,  rhinitis,  cold sores, snoring, swollen glands Cardiovascular: HTN, edema, arrhythmia, pacemaker in place, defibrillator in place, chest pain/tightness, chronic anticoagulation, blood clot, heart failure, MI Peripheral Vascular: leg cramps, varicose veins, blood clots, lymphedema, varicosities Respiratory:  difficulty breathing, denies congestion, SOB, wheezing, cough, emphysema Gastrointestinal: change in appetite or weight, abdominal pain, constipation, diarrhea, nausea, vomiting, vomiting blood, change in bowel habits, abdominal pain, jaundice, rectal bleeding, hemorrhoids, GERD Genitourinary:  nocturia,  pain on urination, polyuria,  blood in urine, Foley catheter, urinary urgency, ESRD on hemodialysis Musculoskeletal: amputation, cramping, stiff joints, painful joints, decreased joint motion, fractures, OA, gout,  hemiplegia, paraplegia, uses cane, wheelchair bound, uses walker, uses rollator Skin: +changes in toenails, color change, dryness, itching, mole changes,  rash, wound(s) Neurological: headaches, numbness in feet, paresthesias in feet, burning in feet, fainting,  seizures, change in speech,  headaches, memory problems/poor historian, cerebral palsy, weakness, paralysis, CVA, TIA Endocrine: diabetes, hypothyroidism, hyperthyroidism,  goiter, dry mouth, flushing, heat intolerance,  cold intolerance,  excessive thirst, denies polyuria,  nocturia Hematological:  easy bleeding, excessive bleeding, easy bruising, enlarged lymph nodes, on long term blood thinner, history of past transusions Allergy/immunological:  hives, eczema, frequent infections, multiple drug allergies, seasonal allergies, transplant recipient, multiple food allergies Psychiatric:  anxiety, depression, mood disorder, suicidal ideations, hallucinations, insomnia  Objective: Vitals:   12/18/19 1518  BP: (!) 146/99  Pulse: 79  Temp: (!) 96.4 F (35.8 C)    74 y.o. male, morbidly obese, IN NAD. AAO X 3.  Vascular Examination: Capillary fill time to digits <3 seconds b/l. Palpable DP pulses b/l. Palpable PT pulses b/l. Pedal hair sparse b/l. Skin temperature gradient within normal limits b/l. No pain with calf compression b/l. Nonpitting edema noted b/l LE.  Dermatological Examination: Pedal skin with normal turgor, texture and tone bilaterally. No open wounds bilaterally. No interdigital macerations bilaterally. Toenails 1-5 b/l elongated, dystrophic, thickened, crumbly with subungual debris and tenderness to dorsal palpation.  Musculoskeletal Examination: Normal muscle strength 5/5 to all lower extremity muscle groups bilaterally. No pain crepitus or joint limitation noted with ROM b/l. Hallux valgus with bunion deformity noted b/l. Hammertoes noted to the L 2nd toe. Pes planus deformity noted b/l.   Neurological  Examination: Protective sensation intact 5/5 intact bilaterally with 10g monofilament b/l. Proprioception intact bilaterally. Babinski reflex negative b/l. Clonus negative b/l.  Assessment: 1. Pain due to onychomycosis of toenails of both feet   2. Acquired hammertoes of both feet   3. Pes planus of both feet   4. Non-insulin dependent type 2 diabetes mellitus ([place])   5. Encounter for diabetic foot exam ([place])     Plan: -Examined patient. -Diabetic foot examination performed on today's visit. -Discussed diabetic foot care principles. Literature dispensed on today. -Toenails 1-5 b/l were debrided in length and girth with sterile nail nippers and dremel without iatrogenic bleeding.  -Patient to continue soft, supportive shoe gear daily. Start procedure for diabetic shoes. Patient qualifies based on diagnoses. -Patient to report any pedal injuries to medical professional immediately. -Patient/POA to call should there be question/concern in the interim.  Return in about 3 months (around 03/19/2020) for diabetic nail trim.

## 2020-01-04 ENCOUNTER — Other Ambulatory Visit: Payer: Medicare Other | Admitting: Orthotics

## 2020-01-04 ENCOUNTER — Other Ambulatory Visit: Payer: Self-pay

## 2020-01-14 ENCOUNTER — Other Ambulatory Visit: Payer: Self-pay

## 2020-01-14 ENCOUNTER — Ambulatory Visit (INDEPENDENT_AMBULATORY_CARE_PROVIDER_SITE_OTHER): Payer: Medicare Other | Admitting: *Deleted

## 2020-01-14 DIAGNOSIS — E538 Deficiency of other specified B group vitamins: Secondary | ICD-10-CM | POA: Diagnosis not present

## 2020-01-14 MED ORDER — CYANOCOBALAMIN 1000 MCG/ML IJ SOLN
1000.0000 ug | Freq: Once | INTRAMUSCULAR | Status: AC
  Administered 2020-01-14: 1000 ug via INTRAMUSCULAR

## 2020-01-14 NOTE — Progress Notes (Signed)
Pls cosign for B12 inj../lmb  

## 2020-02-13 ENCOUNTER — Other Ambulatory Visit: Payer: Self-pay

## 2020-02-13 ENCOUNTER — Ambulatory Visit (INDEPENDENT_AMBULATORY_CARE_PROVIDER_SITE_OTHER): Payer: Medicare Other

## 2020-02-13 DIAGNOSIS — E538 Deficiency of other specified B group vitamins: Secondary | ICD-10-CM

## 2020-02-13 MED ORDER — CYANOCOBALAMIN 1000 MCG/ML IJ SOLN
1000.0000 ug | INTRAMUSCULAR | Status: AC
  Administered 2020-02-13: 1000 ug via INTRAMUSCULAR

## 2020-02-13 NOTE — Progress Notes (Signed)
Pt here for monthly B12 injection per Dr Jenny Reichmann.  B12 102mcg given IM left deltoid and pt tolerated injection well.  Pt to schedule next injection appt upon check out.   Confirmed with MD that pt is to continue monthly B12 injections & that pt has not had B12 lab drawn since 12/22/18.

## 2020-03-17 ENCOUNTER — Other Ambulatory Visit: Payer: Self-pay

## 2020-03-17 ENCOUNTER — Ambulatory Visit (INDEPENDENT_AMBULATORY_CARE_PROVIDER_SITE_OTHER): Payer: Medicare Other | Admitting: *Deleted

## 2020-03-17 DIAGNOSIS — E538 Deficiency of other specified B group vitamins: Secondary | ICD-10-CM

## 2020-03-17 MED ORDER — CYANOCOBALAMIN 1000 MCG/ML IJ SOLN
1000.0000 ug | Freq: Once | INTRAMUSCULAR | Status: AC
  Administered 2020-03-17: 1000 ug via INTRAMUSCULAR

## 2020-03-17 NOTE — Progress Notes (Signed)
Pls cosign for B12 inj in absence of PCP../lmb   

## 2020-03-18 ENCOUNTER — Encounter: Payer: Self-pay | Admitting: Podiatry

## 2020-03-18 ENCOUNTER — Ambulatory Visit (INDEPENDENT_AMBULATORY_CARE_PROVIDER_SITE_OTHER): Payer: Medicare Other | Admitting: Podiatry

## 2020-03-18 DIAGNOSIS — M79674 Pain in right toe(s): Secondary | ICD-10-CM

## 2020-03-18 DIAGNOSIS — E119 Type 2 diabetes mellitus without complications: Secondary | ICD-10-CM

## 2020-03-18 DIAGNOSIS — B351 Tinea unguium: Secondary | ICD-10-CM

## 2020-03-18 DIAGNOSIS — M2012 Hallux valgus (acquired), left foot: Secondary | ICD-10-CM

## 2020-03-18 DIAGNOSIS — M2142 Flat foot [pes planus] (acquired), left foot: Secondary | ICD-10-CM

## 2020-03-18 DIAGNOSIS — M2042 Other hammer toe(s) (acquired), left foot: Secondary | ICD-10-CM

## 2020-03-18 DIAGNOSIS — M2141 Flat foot [pes planus] (acquired), right foot: Secondary | ICD-10-CM

## 2020-03-18 DIAGNOSIS — M2041 Other hammer toe(s) (acquired), right foot: Secondary | ICD-10-CM

## 2020-03-18 DIAGNOSIS — M2011 Hallux valgus (acquired), right foot: Secondary | ICD-10-CM

## 2020-03-18 DIAGNOSIS — M79675 Pain in left toe(s): Secondary | ICD-10-CM | POA: Diagnosis not present

## 2020-03-18 NOTE — Patient Instructions (Signed)
Moisturize feet once daily; do not apply between toes: A.  Aquaphor Healing Ointment B.  Vaseline Intensive Care Lotion C.  Lubriderm Lotion D.  Gold Bond Diabetic Foot Lotion E.  Eucerin Intensive Repair Moisturizing Lotion  If you have problems reaching your feet:  A.  Aquaphor Advanced Therapy Ointment Body Spray B.  Vaseline Intensive Care Spray Lotion Advanced Repair     

## 2020-03-19 MED ORDER — TORSEMIDE 20 MG PO TABS: ORAL_TABLET | ORAL | 0 refills | Status: DC

## 2020-03-19 NOTE — Progress Notes (Signed)
Subjective:  Patient ID: [name] [name], male    DOB: [birth year],  MRN: [id number]  74 y.o. male presents with preventative diabetic foot care and painful thick toenails that are difficult to trim. Pain interferes with ambulation. Aggravating factors include wearing enclosed shoe gear. Pain is relieved with periodic professional debridement.   His daughter is present during today's visit. He is inquiring about the status of his diabetic shoes on today's visit. They voice no other pedal concerns on today's visit.  Review of Systems: In addition to above, he is on supplemental oxygen. Past Medical History:  Diagnosis Date  . Acute right-sided CHF (congestive heart failure) ([place])    a. 01/2013.  . Arthritis    "knees and hands; thoracic area of the spine" (01/05/2013)  . BRONCHITIS, CHRONIC 01/02/2009  . COMMON MIGRAINE    "none since treating for high BP"  . Complication of anesthesia    "aspiration pneumonia after hand OR" (01/05/2013)  . DIABETES MELLITUS, TYPE II   . GOUT   . HYPERLIPIDEMIA   . HYPERTENSION   . Long term (current) use of anticoagulants   . Morbid obesity ([place])   . OBESITY HYPOVENTILATION SYNDROME    a. on home O2.  . On home oxygen therapy   . OSA (obstructive sleep apnea)   . Permanent atrial fibrillation ([place])   . PROSTATE SPECIFIC ANTIGEN, ELEVATED 06/09/2007  . Pulmonary HTN ([place])    a. multifactorial including obstructive sleep apnea, obesity hypoventilation syndrome and possible pulmonary venous hypertension.  Marland Kitchen SKIN RASH 03/12/2010  . Unspecified hearing loss    Past Surgical History:  Procedure Laterality Date  . APPENDECTOMY  1974  . CARDIOVERSION  05/21/2008; ~ 06/2008  . HERNIA REPAIR    . INGUINAL HERNIA REPAIR Right   . RIGHT HEART CATHETERIZATION N/A 01/08/2013   Procedure: RIGHT HEART CATH;  Surgeon: Thayer Headings, MD;  [facility];  Service: Cardiovascular;  Laterality: N/A;  . TENDON REPAIR Right 2009   "lacerated his tendon and pulley"  Dr. Lenon Curt  . UMBILICAL HERNIA REPAIR     Patient Active Problem List   Diagnosis Date Noted  . Greater trochanteric bursitis, right 08/14/2019  . Tick bite, infected 12/19/2018  . Degenerative arthritis of right knee 02/21/2017  . Right knee pain 02/15/2017  . Volume overload 12/23/2015  . OSA treated with BiPAP 12/23/2015  . Pulmonary hypertension ([place]) 12/23/2015  . Gout 12/23/2015  . Cellulitis of leg, right 02/14/2015  . Allergic rhinitis, cause unspecified 11/21/2013  . Chronic cor pulmonale ([place]) 12/22/2012  . DOE (dyspnea on exertion) 12/20/2012  . Left knee pain 07/11/2012  . Increased prostate specific antigen (PSA) velocity 01/06/2012  . Preventative health care 12/25/2010  . Long term (current) use of anticoagulants 11/06/2010  . HEMATOCHEZIA 06/26/2010  . BRONCHITIS, CHRONIC 01/02/2009  . Obesity hypoventilation syndrome ([place]) 11/26/2008  . Permanent atrial fibrillation 10/16/2008  . PERIPHERAL EDEMA 01/05/2008  . Non-insulin dependent type 2 diabetes mellitus ([place]) 06/09/2007  . COMMON MIGRAINE 06/09/2007  . Unspecified hearing loss 06/09/2007  . PROSTATE SPECIFIC ANTIGEN, ELEVATED 06/09/2007  . Hyperlipidemia 02/18/2007  . Severe obesity (BMI >= 40) ([place]) 02/18/2007  . Essential hypertension 02/18/2007    Current Outpatient Medications:  .  allopurinol (ZYLOPRIM) 100 MG tablet, Take 1 tablet (100 mg total) by mouth daily., Disp: 90 tablet, Rfl: 3 .  atorvastatin (LIPITOR) 20 MG tablet, Take 1 tablet (20 mg total) by mouth daily., Disp: 90 tablet, Rfl: 3 .  cetirizine (ZYRTEC) 10 MG tablet, Take 10 mg by mouth at bedtime., Disp: , Rfl:  .  cyanocobalamin (,VITAMIN B-12,) 1000 MCG/ML injection, Inject 1,000 mcg into the muscle., Disp: , Rfl:  .  diltiazem (CARDIZEM CD) 180 MG 24 hr capsule, TAKE 1 CAPSULE DAILY (NEED OFFICE VISIT), Disp: 90 capsule, Rfl: 3 .  ELIQUIS 5 MG TABS tablet, TAKE 1 TABLET TWICE A DAY, Disp: 180 tablet, Rfl: 3 .  glipiZIDE (GLUCOTROL XL)  10 MG 24 hr tablet, Take 1 tablet (10 mg total) by mouth daily with breakfast., Disp: 90 tablet, Rfl: 3 .  glucose blood (ONE TOUCH ULTRA TEST) test strip, Use to check blood sugars once daily Dx E11.9, Disp: 100 each, Rfl: 2 .  Lancets MISC, Use as directed once daily  E11.9, Disp: 100 each, Rfl: 12 .  metFORMIN (GLUCOPHAGE-XR) 500 MG 24 hr tablet, 2 tabs by mouth in the AM, Disp: 180 tablet, Rfl: 3 .  metoprolol tartrate (LOPRESSOR) 25 MG tablet, Take 1 tablet (25 mg total) by mouth 2 (two) times daily., Disp: 180 tablet, Rfl: 3 .  Misc Natural Products (TART CHERRY ADVANCED) CAPS, Take 1 capsule by mouth daily., Disp: , Rfl:  .  OXYGEN, Inhale 3-5 L into the lungs daily. 3L Daily  4L Resting  5L Exertion, Disp: , Rfl:  .  potassium chloride SA (KLOR-CON) 20 MEQ tablet, Take 1 tablet (20 mEq total) by mouth 2 (two) times daily., Disp: 180 tablet, Rfl: 3 .  torsemide (DEMADEX) 20 MG tablet, TAKE 2 TABLETS TWICE A DAY (PLEASE CONTACT OFFICE FOR ADDITIONAL REFILLS), Disp: 360 tablet, Rfl: 3  Current Facility-Administered Medications:  .  cyanocobalamin ((VITAMIN B-12)) injection 1,000 mcg, 1,000 mcg, Intramuscular, Q30 days, [name], MD, 1,000 mcg at 02/13/20 1012 Allergies  Allergen Reactions  . Codeine     Altered mental status "goofy"  . Heparin Rash     Abdominal rash 01/2013   Social History   Tobacco Use  Smoking Status Former Smoker  . Packs/day: 1.00  . Years: 15.00  . Pack years: 15.00  . Types: Cigarettes  . Quit date: 08/09/1978  . Years since quitting: 41.6  Smokeless Tobacco Never Used  Tobacco Comment   01/05/2013 "quit smoking ~ 40 years ago"   Objective:  There were no vitals filed for this visit. Constitutional Patient is a pleasant 74 y.o. Caucasian male in NAD.Marland Kitchen AAO x 3.  Vascular Capillary fill time to digits <3 seconds b/l lower extremities. Palpable pedal pulses b/l LE. Pedal hair sparse. Lower extremity skin temperature gradient within normal limits. No  pain with calf compression b/l. No edema noted b/l lower extremities. No cyanosis or clubbing noted.  Neurologic Normal speech. Oriented to person, place, and time. Protective sensation intact 5/5 intact bilaterally with 10g monofilament b/l. Vibratory sensation intact b/l. Proprioception intact bilaterally.  Dermatologic Pedal skin with normal turgor, texture and tone bilaterally. No open wounds bilaterally. No interdigital macerations bilaterally. Toenails 1-5 b/l elongated, discolored, dystrophic, thickened, crumbly with subungual debris and tenderness to dorsal palpation.  Orthopedic: Normal muscle strength 5/5 to all lower extremity muscle groups bilaterally. No pain crepitus or joint limitation noted with ROM b/l. Hallux valgus with bunion deformity noted b/l lower extremities. Hammertoes noted to the L 2nd toe. Pes planus deformity noted b/l.    Assessment:   1. Pain due to onychomycosis of toenails of both feet   2. Acquired hammertoes of both feet   3. Pes planus of both feet  4. Hallux valgus, acquired, bilateral   5. Non-insulin dependent type 2 diabetes mellitus ([place])    Plan:  Patient was evaluated and treated and all questions answered.  Onychomycosis with pain -Nails palliatively debridement as below. -Educated on self-care  Procedure: Nail Debridement Rationale: Pain Type of Debridement: manual, sharp debridement. Instrumentation: Nail nipper, rotary burr. Number of Nails: 10  -Examined patient. -His diabetic shoes are on back order. We will call him when they arrive. -No new findings. No new orders. -Continue diabetic foot care principles. -Toenails 1-5 b/l were debrided in length and girth with sterile nail nippers and dremel without iatrogenic bleeding.  -Patient to report any pedal injuries to medical professional immediately. -Patient to continue soft, supportive shoe gear daily. -Patient/POA to call should there be question/concern in the interim.  Return  in about 3 months (around 06/18/2020) for diabetic nail trim.  Marzetta Board, DPM

## 2020-03-25 ENCOUNTER — Other Ambulatory Visit: Payer: Self-pay

## 2020-03-31 ENCOUNTER — Other Ambulatory Visit: Payer: Self-pay

## 2020-04-02 ENCOUNTER — Ambulatory Visit: Payer: Self-pay

## 2020-04-02 ENCOUNTER — Ambulatory Visit (INDEPENDENT_AMBULATORY_CARE_PROVIDER_SITE_OTHER): Payer: Medicare Other | Admitting: Family Medicine

## 2020-04-02 ENCOUNTER — Encounter: Payer: Self-pay | Admitting: Family Medicine

## 2020-04-02 ENCOUNTER — Ambulatory Visit: Payer: Medicare Other | Admitting: Internal Medicine

## 2020-04-02 ENCOUNTER — Other Ambulatory Visit: Payer: Self-pay

## 2020-04-02 VITALS — BP 132/78 | HR 80 | Ht 68.0 in | Wt 276.0 lb

## 2020-04-02 DIAGNOSIS — G8929 Other chronic pain: Secondary | ICD-10-CM | POA: Diagnosis not present

## 2020-04-02 DIAGNOSIS — M19011 Primary osteoarthritis, right shoulder: Secondary | ICD-10-CM

## 2020-04-02 DIAGNOSIS — M75121 Complete rotator cuff tear or rupture of right shoulder, not specified as traumatic: Secondary | ICD-10-CM

## 2020-04-02 DIAGNOSIS — M75101 Unspecified rotator cuff tear or rupture of right shoulder, not specified as traumatic: Secondary | ICD-10-CM | POA: Insufficient documentation

## 2020-04-02 DIAGNOSIS — M25511 Pain in right shoulder: Secondary | ICD-10-CM | POA: Diagnosis not present

## 2020-04-02 NOTE — Progress Notes (Signed)
[place] Phone: [phone] Subjective:   [name], am serving as a scribe for Dr. Hulan Saas. This visit occurred during the SARS-CoV-2 public health emergency.  Safety protocols were in place, including screening questions prior to the visit, additional usage of staff PPE, and extensive cleaning of exam room while observing appropriate contact time as indicated for disinfecting solutions.   I'm seeing this patient by the request  of:  [name], MD  CC: Right shoulder pain  [id number]   08/14/2019 Patient given injection, tolerated the procedure well, discussed icing regimen and home exercise, which activities to do which wants to avoid.  Patient is to increase activity slowly over the course of next several weeks.  Discussed icing regimen again.  Decided to do injections secondary to coronavirus and would like patient to avoid coming back if possible.  Patient's significant other has multiple sclerosis and is not doing well at the moment and is on chronic steroids.  Differential includes lumbar radiculopathy would need to consider possible medications thereafter.  Update 04/02/2020 [name] [name] is a 74 y.o. male coming in with complaint of right shoulder pain for past 5 months. Pain throughout the joint. Reaching overhead increases pain. Pain can be sharp. Using Tylenol. Denies any radiating symptoms.  Patient recently has been doing a little bit more around the house.  Patient in the last month did lose his wife which has been very difficult for patient's primary caregiver is his daughter who is here at his side.      Past Medical History:  Diagnosis Date  . Acute right-sided CHF (congestive heart failure) ([place])    a. 01/2013.  . Arthritis    "knees and hands; thoracic area of the spine" (01/05/2013)  . BRONCHITIS, CHRONIC 01/02/2009  . COMMON MIGRAINE    "none since treating for high BP"  .  Complication of anesthesia    "aspiration pneumonia after hand OR" (01/05/2013)  . DIABETES MELLITUS, TYPE II   . GOUT   . HYPERLIPIDEMIA   . HYPERTENSION   . Long term (current) use of anticoagulants   . Morbid obesity ([place])   . OBESITY HYPOVENTILATION SYNDROME    a. on home O2.  . On home oxygen therapy   . OSA (obstructive sleep apnea)   . Permanent atrial fibrillation ([place])   . PROSTATE SPECIFIC ANTIGEN, ELEVATED 06/09/2007  . Pulmonary HTN ([place])    a. multifactorial including obstructive sleep apnea, obesity hypoventilation syndrome and possible pulmonary venous hypertension.  Marland Kitchen SKIN RASH 03/12/2010  . Unspecified hearing loss    Past Surgical History:  Procedure Laterality Date  . APPENDECTOMY  1974  . CARDIOVERSION  05/21/2008; ~ 06/2008  . HERNIA REPAIR    . INGUINAL HERNIA REPAIR Right   . RIGHT HEART CATHETERIZATION N/A 01/08/2013   Procedure: RIGHT HEART CATH;  Surgeon: Thayer Headings, MD;  Location: [facility] CATH LAB;  Service: Cardiovascular;  Laterality: N/A;  . TENDON REPAIR Right 2009   "lacerated his tendon and pulley" Dr. Lenon Curt  . UMBILICAL HERNIA REPAIR     Social History   Socioeconomic History  . Marital status: Married    Spouse name: Not on file  . Number of children: 2  . Years of education: Not on file  . Highest education level: Not on file  Occupational History  . Occupation: Quarry manager: [employer]  Tobacco Use  .  Smoking status: Former Smoker    Packs/day: 1.00    Years: 15.00    Pack years: 15.00    Types: Cigarettes    Quit date: 08/09/1978    Years since quitting: 41.6  . Smokeless tobacco: Never Used  . Tobacco comment: 01/05/2013 "quit smoking ~ 40 years ago"  Vaping Use  . Vaping Use: Never used  Substance and Sexual Activity  . Alcohol use: Yes    Alcohol/week: 0.0 standard drinks    Comment: 01/05/2013 "hasn't had a drink since 1980's; never had problem w/it"  . Drug use: No  . Sexual activity: Not Currently   Other Topics Concern  . Not on file  Social History Narrative  . Not on file   Social Determinants of Health   Financial Resource Strain:   . Difficulty of Paying Living Expenses: Not on file  Food Insecurity:   . Worried About Charity fundraiser in the Last Year: Not on file  . Ran Out of Food in the Last Year: Not on file  Transportation Needs:   . Lack of Transportation (Medical): Not on file  . Lack of Transportation (Non-Medical): Not on file  Physical Activity:   . Days of Exercise per Week: Not on file  . Minutes of Exercise per Session: Not on file  Stress:   . Feeling of Stress : Not on file  Social Connections:   . Frequency of Communication with Friends and Family: Not on file  . Frequency of Social Gatherings with Friends and Family: Not on file  . Attends Religious Services: Not on file  . Active Member of Clubs or Organizations: Not on file  . Attends Archivist Meetings: Not on file  . Marital Status: Not on file   Allergies  Allergen Reactions  . Codeine     Altered mental status "goofy"  . Heparin Rash     Abdominal rash 01/2013   Family History  Problem Relation Age of Onset  . Alzheimer's disease Father   . Prostate cancer Father   . Cancer Other        Breast Cancer, <50 yo 1st degree relative  . Coronary artery disease Other        Male, 1st degree relative    Current Outpatient Medications (Endocrine & Metabolic):  .  glipiZIDE (GLUCOTROL XL) 10 MG 24 hr tablet, Take 1 tablet (10 mg total) by mouth daily with breakfast. .  metFORMIN (GLUCOPHAGE-XR) 500 MG 24 hr tablet, 2 tabs by mouth in the AM   Current Outpatient Medications (Cardiovascular):  .  atorvastatin (LIPITOR) 20 MG tablet, Take 1 tablet (20 mg total) by mouth daily. Marland Kitchen  diltiazem (CARDIZEM CD) 180 MG 24 hr capsule, TAKE 1 CAPSULE DAILY (NEED OFFICE VISIT) .  metoprolol tartrate (LOPRESSOR) 25 MG tablet, Take 1 tablet (25 mg total) by mouth 2 (two) times daily. Marland Kitchen   torsemide (DEMADEX) 20 MG tablet, TAKE 2 TABLETS TWICE A DAY (PLEASE CONTACT OFFICE FOR ADDITIONAL REFILLS)   Current Outpatient Medications (Respiratory):  .  cetirizine (ZYRTEC) 10 MG tablet, Take 10 mg by mouth at bedtime.   Current Outpatient Medications (Analgesics):  .  allopurinol (ZYLOPRIM) 100 MG tablet, Take 1 tablet (100 mg total) by mouth daily.   Current Outpatient Medications (Hematological):  .  cyanocobalamin (,VITAMIN B-12,) 1000 MCG/ML injection, Inject 1,000 mcg into the muscle. Marland Kitchen  ELIQUIS 5 MG TABS tablet, TAKE 1 TABLET TWICE A DAY  Current Facility-Administered Medications (Hematological):  Marland Kitchen  cyanocobalamin ((VITAMIN B-12)) injection 1,000 mcg  Current Outpatient Medications (Other):  .  glucose blood (ONE TOUCH ULTRA TEST) test strip, Use to check blood sugars once daily Dx E11.9 .  Lancets MISC, Use as directed once daily  E11.9 .  Misc Natural Products (TART CHERRY ADVANCED) CAPS, Take 1 capsule by mouth daily. .  OXYGEN, Inhale 3-5 L into the lungs daily. 3L Daily  4L Resting  5L Exertion .  potassium chloride SA (KLOR-CON) 20 MEQ tablet, Take 1 tablet (20 mEq total) by mouth 2 (two) times daily.    Reviewed prior external information including notes and imaging from  primary care provider As well as notes that were available from care everywhere and other healthcare systems.  Past medical history, social, surgical and family history all reviewed in electronic medical record.  No pertanent information unless stated regarding to the chief complaint.   Review of Systems:  No headache, visual changes, nausea, vomiting, diarrhea, constipation, dizziness, abdominal pain, skin rash, fevers, chills, night sweats, weight loss, swollen lymph nodes, body aches, joint swelling, chest pain, shortness of breath, mood changes. POSITIVE muscle aches  Objective  Blood pressure 132/78, pulse 80, height 5\' 8"  (1.727 m), weight 276 lb (125.2 kg), SpO2 95 %.   General: No  apparent distress alert and oriented x3 mood and affect normal, dressed appropriately.  HEENT: Pupils equal, extraocular movements intact  Respiratory: Patient's speak in full sentences patient is wearing a nasal cannula underneath his mask Cardiovascular: 1+ lower extremity edema, mildly tender, no erythema  Gait antalgic using a walker MSK:   Right shoulder exam shows the patient does have positive impingement.  Patient does have weakness and does have some limited range of motion with forward flexion of 160 degrees, external rotation of only 10 degrees, positive impingement.  Limited musculoskeletal ultrasound was performed and interpreted by Lyndal Pulley  Limited ultrasound shows the patient does have a fairly large tear of the supraspinatus and subscapularis that seems to be near full-thickness bilaterally.  Patient does have severe arthritic changes of the acromioclavicular joint with effusion noted.  Patient does have a reactive subacromial bursitis Impression: Rotator cuff tear, acromioclavicular arthritis  Procedure: Real-time Ultrasound Guided Injection of right glenohumeral joint Device: GE Logiq Q7  Ultrasound guided injection is preferred based studies that show increased duration, increased effect, greater accuracy, decreased procedural pain, increased response rate with ultrasound guided versus blind injection.  Verbal informed consent obtained.  Time-out conducted.  Noted no overlying erythema, induration, or other signs of local infection.  Skin prepped in a sterile fashion.  Local anesthesia: Topical Ethyl chloride.  With sterile technique and under real time ultrasound guidance:  Joint visualized.  23g 1  inch needle inserted posterior approach. Pictures taken for needle placement. Patient did have injection of 2 cc of 1% lidocaine, 2 cc of 0.5% Marcaine, and 1.0 cc of Kenalog 40 mg/dL. Completed without difficulty  Pain immediately resolved suggesting accurate placement  of the medication.  Advised to call if fevers/chills, erythema, induration, drainage, or persistent bleeding.  Images permanently stored and available for review in the ultrasound unit.  Impression: Technically successful ultrasound guided injection.   Impression and Recommendations:     The above documentation has been reviewed and is accurate and complete Lyndal Pulley, DO       Note: This dictation was prepared with Dragon dictation along with smaller phrase technology. Any transcriptional errors that result from this process are unintentional.

## 2020-04-02 NOTE — Assessment & Plan Note (Signed)
Patient has what appears to be a fairly full rotator cuff tear of the supraspinatus.  Patient would not be a surgical candidate.  We will try home exercises given injection for the reactive bursitis that was noted on ultrasound today.  Hopeful that this will make some improvement in the pain at the moment.  Increase activity slowly.  Follow-up again in 4 to 8 weeks

## 2020-04-02 NOTE — Assessment & Plan Note (Signed)
Patient given injection, tolerated the procedure well, discussed icing regimen and home exercise, which activities to doing which wants to avoid.  Increase activity slowly.  Patient does have an underlying rotator cuff tear that I think is also going to contribute.  Patient's has many different comorbidities and social determinants of health that make different more aggressive therapies difficult.  Follow-up with me again 2 to 3 months

## 2020-04-02 NOTE — Patient Instructions (Addendum)
Injected shoulder and AC joint today Exercises 3x a week See me again 2-3 months

## 2020-04-04 ENCOUNTER — Other Ambulatory Visit: Payer: Self-pay

## 2020-04-11 ENCOUNTER — Other Ambulatory Visit: Payer: Self-pay

## 2020-04-11 ENCOUNTER — Ambulatory Visit (INDEPENDENT_AMBULATORY_CARE_PROVIDER_SITE_OTHER): Payer: Medicare Other | Admitting: Internal Medicine

## 2020-04-11 ENCOUNTER — Encounter: Payer: Self-pay | Admitting: Internal Medicine

## 2020-04-11 VITALS — BP 136/84 | HR 64 | Temp 98.1°F | Ht 68.0 in | Wt 269.0 lb

## 2020-04-11 DIAGNOSIS — H9391 Unspecified disorder of right ear: Secondary | ICD-10-CM | POA: Diagnosis not present

## 2020-04-11 DIAGNOSIS — E119 Type 2 diabetes mellitus without complications: Secondary | ICD-10-CM

## 2020-04-11 DIAGNOSIS — I10 Essential (primary) hypertension: Secondary | ICD-10-CM | POA: Diagnosis not present

## 2020-04-11 DIAGNOSIS — E7800 Pure hypercholesterolemia, unspecified: Secondary | ICD-10-CM

## 2020-04-11 NOTE — Patient Instructions (Addendum)
Please remember to see your yearly eye doctor exam  Please continue all other medications as before, and refills have been done if requested.  Please have the pharmacy call with any other refills you may need.  Please continue your efforts at being more active, low cholesterol diet, and weight control.  You are otherwise up to date with prevention measures today.  Please keep your appointments with your specialists as you may have planned  You will be contacted regarding the referral for: dermatology  Please go to the LAB at the blood drawing area for the tests to be done  You will be contacted by phone if any changes need to be made immediately.  Otherwise, you will receive a letter about your results with an explanation, but please check with MyChart first.  Please remember to sign up for MyChart if you have not done so, as this will be important to you in the future with finding out test results, communicating by private email, and scheduling acute appointments online when needed.  Please make an Appointment to return in 6 months, or sooner if needed

## 2020-04-11 NOTE — Progress Notes (Signed)
Subjective:    Patient ID: [name] [name], male    DOB: 11/17/1945, 74 y.o.   MRN: [id number]  HPI  Here to f/u; overall doing ok,  Pt denies chest pain, increasing sob or doe, wheezing, orthopnea, PND, increased LE swelling, palpitations, dizziness or syncope.  Pt denies new neurological symptoms such as new headache, or facial or extremity weakness or numbness.  Pt denies polydipsia, polyuria, or low sugar episode.  Pt states overall good compliance with meds, mostly trying to follow appropriate diet, with wt overall stable,  but little exercise however.  Also has a worsening horn like lesion to the mid right pinna projecting back but keeps getting caught in the mask leads.   Past Medical History:  Diagnosis Date  . Acute right-sided CHF (congestive heart failure) ([place])    a. 01/2013.  . Arthritis    "knees and hands; thoracic area of the spine" (01/05/2013)  . BRONCHITIS, CHRONIC 01/02/2009  . COMMON MIGRAINE    "none since treating for high BP"  . Complication of anesthesia    "aspiration pneumonia after hand OR" (01/05/2013)  . DIABETES MELLITUS, TYPE II   . GOUT   . HYPERLIPIDEMIA   . HYPERTENSION   . Long term (current) use of anticoagulants   . Morbid obesity ([place])   . OBESITY HYPOVENTILATION SYNDROME    a. on home O2.  . On home oxygen therapy   . OSA (obstructive sleep apnea)   . Permanent atrial fibrillation ([place])   . PROSTATE SPECIFIC ANTIGEN, ELEVATED 06/09/2007  . Pulmonary HTN ([place])    a. multifactorial including obstructive sleep apnea, obesity hypoventilation syndrome and possible pulmonary venous hypertension.  Marland Kitchen SKIN RASH 03/12/2010  . Unspecified hearing loss    Past Surgical History:  Procedure Laterality Date  . APPENDECTOMY  1974  . CARDIOVERSION  05/21/2008; ~ 06/2008  . HERNIA REPAIR    . INGUINAL HERNIA REPAIR Right   . RIGHT HEART CATHETERIZATION N/A 01/08/2013   Procedure: RIGHT HEART CATH;  Surgeon: Thayer Headings, MD;  Location: [facility] CATH LAB;  Service:  Cardiovascular;  Laterality: N/A;  . TENDON REPAIR Right 2009   "lacerated his tendon and pulley" Dr. Lenon Curt  . UMBILICAL HERNIA REPAIR      reports that he quit smoking about 41 years ago. His smoking use included cigarettes. He has a 15.00 pack-year smoking history. He has never used smokeless tobacco. He reports current alcohol use. He reports that he does not use drugs. family history includes Alzheimer's disease in his father; Cancer in an other family member; Coronary artery disease in an other family member; Prostate cancer in his father. Allergies  Allergen Reactions  . Codeine     Altered mental status "goofy"  . Heparin Rash     Abdominal rash 01/2013   Current Outpatient Medications on File Prior to Visit  Medication Sig Dispense Refill  . allopurinol (ZYLOPRIM) 100 MG tablet Take 1 tablet (100 mg total) by mouth daily. 90 tablet 3  . atorvastatin (LIPITOR) 20 MG tablet Take 1 tablet (20 mg total) by mouth daily. 90 tablet 3  . cetirizine (ZYRTEC) 10 MG tablet Take 10 mg by mouth at bedtime.    . cyanocobalamin (,VITAMIN B-12,) 1000 MCG/ML injection Inject 1,000 mcg into the muscle.    . diltiazem (CARDIZEM CD) 180 MG 24 hr capsule TAKE 1 CAPSULE DAILY (NEED OFFICE VISIT) 90 capsule 3  . ELIQUIS 5 MG TABS tablet TAKE 1 TABLET TWICE A DAY 180  tablet 3  . glipiZIDE (GLUCOTROL XL) 10 MG 24 hr tablet Take 1 tablet (10 mg total) by mouth daily with breakfast. 90 tablet 3  . glucose blood (ONE TOUCH ULTRA TEST) test strip Use to check blood sugars once daily Dx E11.9 100 each 2  . Lancets MISC Use as directed once daily  E11.9 100 each 12  . metFORMIN (GLUCOPHAGE-XR) 500 MG 24 hr tablet 2 tabs by mouth in the AM 180 tablet 3  . metoprolol tartrate (LOPRESSOR) 25 MG tablet Take 1 tablet (25 mg total) by mouth 2 (two) times daily. 180 tablet 3  . Misc Natural Products (TART CHERRY ADVANCED) CAPS Take 1 capsule by mouth daily.    . OXYGEN Inhale 3-5 L into the lungs daily. 3L Daily  4L  Resting  5L Exertion    . potassium chloride SA (KLOR-CON) 20 MEQ tablet Take 1 tablet (20 mEq total) by mouth 2 (two) times daily. 180 tablet 3  . torsemide (DEMADEX) 20 MG tablet TAKE 2 TABLETS TWICE A DAY (PLEASE CONTACT OFFICE FOR ADDITIONAL REFILLS) 120 tablet 0   No current facility-administered medications on file prior to visit.   Review of Systems All otherwise neg per pt     Objective:   Physical Exam BP 136/84   Pulse 64   Temp 98.1 F (36.7 C) (Oral)   Ht 5\' 8"  (1.727 m)   Wt 269 lb (122 kg)   SpO2 96%   BMI 40.90 kg/m  VS noted,  Constitutional: Pt appears in NAD HENT: Head: NCAT.  Right Ear: External ear normal.  Left Ear: External ear normal.  Eyes: . Pupils are equal, round, and reactive to light. Conjunctivae and EOM are normal Nose: without d/c or deformity Neck: Neck supple. Gross normal ROM Cardiovascular: Normal rate and regular rhythm.   Pulmonary/Chest: Effort normal and breath sounds without rales or wheezing.  Abd:  Soft, NT, ND, + BS, no organomegaly Neurological: Pt is alert. At baseline orientation, motor grossly intact Skin: Skin is warm. No rashes, other new lesions, no LE edema Psychiatric: Pt behavior is normal without agitation  Lab Results  Component Value Date   WBC 14.0 (H) 04/11/2020   HGB 16.9 04/11/2020   HCT 51.6 (H) 04/11/2020   PLT 280 04/11/2020   GLUCOSE 83 04/11/2020   CHOL 130 04/11/2020   TRIG 78 04/11/2020   HDL 58 04/11/2020   LDLCALC 56 04/11/2020   ALT 21 04/11/2020   AST 12 04/11/2020   NA 144 04/11/2020   K 5.7 (H) 04/11/2020   CL 100 04/11/2020   CREATININE 1.23 (H) 04/11/2020   BUN 27 (H) 04/11/2020   CO2 33 (H) 04/11/2020   TSH 1.58 04/11/2020   PSA 1.28 12/22/2018   INR 1.25 01/09/2013   HGBA1C 6.4 (H) 04/11/2020   MICROALBUR 7.6 04/11/2020       Assessment & Plan:

## 2020-04-12 ENCOUNTER — Encounter: Payer: Self-pay | Admitting: Internal Medicine

## 2020-04-12 LAB — COMPLETE METABOLIC PANEL WITH GFR
AG Ratio: 1.2 (calc) (ref 1.0–2.5)
ALT: 21 U/L (ref 9–46)
AST: 12 U/L (ref 10–35)
Albumin: 3.8 g/dL (ref 3.6–5.1)
Alkaline phosphatase (APISO): 63 U/L (ref 35–144)
BUN/Creatinine Ratio: 22 (calc) (ref 6–22)
BUN: 27 mg/dL — ABNORMAL HIGH (ref 7–25)
CO2: 33 mmol/L — ABNORMAL HIGH (ref 20–32)
Calcium: 9.8 mg/dL (ref 8.6–10.3)
Chloride: 100 mmol/L (ref 98–110)
Creat: 1.23 mg/dL — ABNORMAL HIGH (ref 0.70–1.18)
GFR, Est African American: 67 mL/min/{1.73_m2} (ref >=60)
GFR, Est Non African American: 57 mL/min/{1.73_m2} — ABNORMAL LOW (ref >=60)
Globulin: 3.1 g/dL (calc) (ref 1.9–3.7)
Glucose, Bld: 83 mg/dL (ref 65–99)
Potassium: 5.7 mmol/L — ABNORMAL HIGH (ref 3.5–5.3)
Sodium: 144 mmol/L (ref 135–146)
Total Bilirubin: 1.4 mg/dL — ABNORMAL HIGH (ref 0.2–1.2)
Total Protein: 6.9 g/dL (ref 6.1–8.1)

## 2020-04-12 LAB — LIPID PANEL
Cholesterol: 130 mg/dL (ref <200)
HDL: 58 mg/dL (ref >=40)
LDL Cholesterol (Calc): 56 mg/dL (calc)
Non-HDL Cholesterol (Calc): 72 mg/dL (calc) (ref <130)
Total CHOL/HDL Ratio: 2.2 (calc) (ref <5.0)
Triglycerides: 78 mg/dL (ref <150)

## 2020-04-12 LAB — URINALYSIS, ROUTINE W REFLEX MICROSCOPIC
Bacteria, UA: NONE SEEN /HPF
Bilirubin Urine: NEGATIVE
Glucose, UA: NEGATIVE
Hgb urine dipstick: NEGATIVE
Hyaline Cast: NONE SEEN /LPF
Ketones, ur: NEGATIVE
Leukocytes,Ua: NEGATIVE
Nitrite: NEGATIVE
Specific Gravity, Urine: 1.012 (ref 1.001–1.03)
Squamous Epithelial / HPF: NONE SEEN /HPF (ref <=5)
WBC, UA: NONE SEEN /HPF (ref 0–5)
pH: 7 (ref 5.0–8.0)

## 2020-04-12 LAB — HEMOGLOBIN A1C
Hgb A1c MFr Bld: 6.4 % of total Hgb — ABNORMAL HIGH (ref <5.7)
Mean Plasma Glucose: 137 (calc)
eAG (mmol/L): 7.6 (calc)

## 2020-04-12 LAB — CBC WITH DIFFERENTIAL/PLATELET
Absolute Monocytes: 1078 cells/uL — ABNORMAL HIGH (ref 200–950)
Basophils Absolute: 56 cells/uL (ref 0–200)
Basophils Relative: 0.4 %
Eosinophils Absolute: 56 cells/uL (ref 15–500)
Eosinophils Relative: 0.4 %
HCT: 51.6 % — ABNORMAL HIGH (ref 38.5–50.0)
Hemoglobin: 16.9 g/dL (ref 13.2–17.1)
Lymphs Abs: 2814 cells/uL (ref 850–3900)
MCH: 28.6 pg (ref 27.0–33.0)
MCHC: 32.8 g/dL (ref 32.0–36.0)
MCV: 87.3 fL (ref 80.0–100.0)
MPV: 10.3 fL (ref 7.5–12.5)
Monocytes Relative: 7.7 %
Neutro Abs: 9996 cells/uL — ABNORMAL HIGH (ref 1500–7800)
Neutrophils Relative %: 71.4 %
Platelets: 280 10*3/uL (ref 140–400)
RBC: 5.91 10*6/uL — ABNORMAL HIGH (ref 4.20–5.80)
RDW: 15.4 % — ABNORMAL HIGH (ref 11.0–15.0)
Total Lymphocyte: 20.1 %
WBC: 14 10*3/uL — ABNORMAL HIGH (ref 3.8–10.8)

## 2020-04-12 LAB — MICROALBUMIN / CREATININE URINE RATIO
Creatinine, Urine: 54 mg/dL (ref 20–320)
Microalb Creat Ratio: 141 mcg/mg creat — ABNORMAL HIGH (ref <30)
Microalb, Ur: 7.6 mg/dL

## 2020-04-12 LAB — TSH: TSH: 1.58 mIU/L (ref 0.40–4.50)

## 2020-04-13 ENCOUNTER — Encounter: Payer: Self-pay | Admitting: Internal Medicine

## 2020-04-13 DIAGNOSIS — H9391 Unspecified disorder of right ear: Secondary | ICD-10-CM | POA: Insufficient documentation

## 2020-04-13 NOTE — Assessment & Plan Note (Signed)
For derm referral

## 2020-04-13 NOTE — Assessment & Plan Note (Signed)
stable overall by history and exam, recent data reviewed with pt, and pt to continue medical treatment as before,  to f/u any worsening symptoms or concerns  

## 2020-04-13 NOTE — Assessment & Plan Note (Addendum)

## 2020-04-17 ENCOUNTER — Ambulatory Visit: Payer: Medicare Other

## 2020-04-18 ENCOUNTER — Ambulatory Visit (INDEPENDENT_AMBULATORY_CARE_PROVIDER_SITE_OTHER): Payer: Medicare Other | Admitting: General Practice

## 2020-04-18 ENCOUNTER — Telehealth (INDEPENDENT_AMBULATORY_CARE_PROVIDER_SITE_OTHER): Payer: Medicare Other | Admitting: Cardiology

## 2020-04-18 ENCOUNTER — Encounter: Payer: Self-pay | Admitting: Cardiology

## 2020-04-18 ENCOUNTER — Other Ambulatory Visit: Payer: Self-pay

## 2020-04-18 VITALS — BP 135/82 | HR 69 | Ht 68.0 in | Wt 272.0 lb

## 2020-04-18 DIAGNOSIS — Z7901 Long term (current) use of anticoagulants: Secondary | ICD-10-CM | POA: Diagnosis not present

## 2020-04-18 DIAGNOSIS — E538 Deficiency of other specified B group vitamins: Secondary | ICD-10-CM

## 2020-04-18 DIAGNOSIS — I2781 Cor pulmonale (chronic): Secondary | ICD-10-CM

## 2020-04-18 DIAGNOSIS — E119 Type 2 diabetes mellitus without complications: Secondary | ICD-10-CM

## 2020-04-18 DIAGNOSIS — I4821 Permanent atrial fibrillation: Secondary | ICD-10-CM

## 2020-04-18 DIAGNOSIS — G4733 Obstructive sleep apnea (adult) (pediatric): Secondary | ICD-10-CM

## 2020-04-18 DIAGNOSIS — I10 Essential (primary) hypertension: Secondary | ICD-10-CM | POA: Diagnosis not present

## 2020-04-18 MED ORDER — CYANOCOBALAMIN 1000 MCG/ML IJ SOLN
1000.0000 ug | Freq: Once | INTRAMUSCULAR | Status: AC
  Administered 2020-04-18: 1000 ug via INTRAMUSCULAR

## 2020-04-18 NOTE — Progress Notes (Signed)
Virtual Visit via Telephone Note   This visit type was conducted due to national recommendations for restrictions regarding the COVID-19 Pandemic (e.g. social distancing) in an effort to limit this patient's exposure and mitigate transmission in our community.  Due to his co-morbid illnesses, this patient is at least at moderate risk for complications without adequate follow up.  This format is felt to be most appropriate for this patient at this time.  The patient did not have access to video technology/had technical difficulties with video requiring transitioning to audio format only (telephone).  All issues noted in this document were discussed and addressed.  No physical exam could be performed with this format.  Please refer to the patient's chart for his  consent to telehealth for [facility]    Date:  04/18/2020   ID:  [name] [name], DOB June 04, 1946, MRN [id number] The patient was identified using 2 identifiers.  Patient Location: Home Provider Location: Home Office  PCP:  [name], MD  Cardiologist:  Kirk Ruths, MD  Electrophysiologist:  None   Evaluation Performed:  Follow-Up Visit  Chief Complaint:  none  History of Present Illness:    [name] [name] is a 74 y.o. male with a history of chronic atrial fibrillation, sleep apnea, diastolic heart failure, and obesity hypoventilation syndrome.  He is also followed by Dr. Halford Chessman.  His last echocardiogram in 2017 showed an ejection fraction of 55 to 60%.  Diastolic dysfunction could not be determined secondary to atrial fibrillation.  He had a severely dilated left atrium and a mildly dilated right atrium.  He was contacted today for routine follow-up.  He last saw Dr. [name] in January 2021.  Since that visit he is done well from a cardiac standpoint.  He does have chronic dyspnea.  He denies any increased swelling in his lower extremities or increased shortness of breath.  He did have labs done on 04/11/2020 which showed a potassium  of 5.7.  He is on twice daily potassium and I asked him to stop this.  He will need a repeat BMP next week.  The patient does not have symptoms concerning for COVID-19 infection (fever, chills, cough, or new shortness of breath).    Past Medical History:  Diagnosis Date  . Acute right-sided CHF (congestive heart failure) ([place])    a. 01/2013.  . Arthritis    "knees and hands; thoracic area of the spine" (01/05/2013)  . BRONCHITIS, CHRONIC 01/02/2009  . COMMON MIGRAINE    "none since treating for high BP"  . Complication of anesthesia    "aspiration pneumonia after hand OR" (01/05/2013)  . DIABETES MELLITUS, TYPE II   . GOUT   . HYPERLIPIDEMIA   . HYPERTENSION   . Long term (current) use of anticoagulants   . Morbid obesity ([place])   . OBESITY HYPOVENTILATION SYNDROME    a. on home O2.  . On home oxygen therapy   . OSA (obstructive sleep apnea)   . Permanent atrial fibrillation ([place])   . PROSTATE SPECIFIC ANTIGEN, ELEVATED 06/09/2007  . Pulmonary HTN ([place])    a. multifactorial including obstructive sleep apnea, obesity hypoventilation syndrome and possible pulmonary venous hypertension.  Marland Kitchen SKIN RASH 03/12/2010  . Unspecified hearing loss    Past Surgical History:  Procedure Laterality Date  . APPENDECTOMY  1974  . CARDIOVERSION  05/21/2008; ~ 06/2008  . HERNIA REPAIR    . INGUINAL HERNIA REPAIR Right   . RIGHT HEART CATHETERIZATION N/A 01/08/2013  Procedure: RIGHT HEART CATH;  Surgeon: Thayer Headings, MD;  Location: [facility] CATH LAB;  Service: Cardiovascular;  Laterality: N/A;  . TENDON REPAIR Right 2009   "lacerated his tendon and pulley" Dr. Lenon Curt  . UMBILICAL HERNIA REPAIR       Current Meds  Medication Sig  . allopurinol (ZYLOPRIM) 100 MG tablet Take 1 tablet (100 mg total) by mouth daily.  Marland Kitchen atorvastatin (LIPITOR) 20 MG tablet Take 1 tablet (20 mg total) by mouth daily.  . cetirizine (ZYRTEC) 10 MG tablet Take 10 mg by mouth at bedtime.  . cyanocobalamin (,VITAMIN B-12,) 1000  MCG/ML injection Inject 1,000 mcg into the muscle.  . diltiazem (CARDIZEM CD) 180 MG 24 hr capsule TAKE 1 CAPSULE DAILY (NEED OFFICE VISIT)  . ELIQUIS 5 MG TABS tablet TAKE 1 TABLET TWICE A DAY  . glipiZIDE (GLUCOTROL XL) 10 MG 24 hr tablet Take 1 tablet (10 mg total) by mouth daily with breakfast.  . glucose blood (ONE TOUCH ULTRA TEST) test strip Use to check blood sugars once daily Dx E11.9  . Lancets MISC Use as directed once daily  E11.9  . metFORMIN (GLUCOPHAGE-XR) 500 MG 24 hr tablet 2 tabs by mouth in the AM  . metoprolol tartrate (LOPRESSOR) 25 MG tablet Take 1 tablet (25 mg total) by mouth 2 (two) times daily.  . Misc Natural Products (TART CHERRY ADVANCED) CAPS Take 1 capsule by mouth daily.  . OXYGEN Inhale 3-5 L into the lungs daily. 3L Daily  4L Resting  5L Exertion  . potassium chloride SA (KLOR-CON) 20 MEQ tablet Take 1 tablet (20 mEq total) by mouth 2 (two) times daily.  Marland Kitchen torsemide (DEMADEX) 20 MG tablet TAKE 2 TABLETS TWICE A DAY (PLEASE CONTACT OFFICE FOR ADDITIONAL REFILLS)     Allergies:   Codeine and Heparin   Social History   Tobacco Use  . Smoking status: Former Smoker    Packs/day: 1.00    Years: 15.00    Pack years: 15.00    Types: Cigarettes    Quit date: 08/09/1978    Years since quitting: 41.7  . Smokeless tobacco: Never Used  . Tobacco comment: 01/05/2013 "quit smoking ~ 40 years ago"  Vaping Use  . Vaping Use: Never used  Substance Use Topics  . Alcohol use: Yes    Alcohol/week: 0.0 standard drinks    Comment: 01/05/2013 "hasn't had a drink since 1980's; never had problem w/it"  . Drug use: No     Family Hx: The patient's family history includes Alzheimer's disease in his father; Cancer in an other family member; Coronary artery disease in an other family member; Prostate cancer in his father.  ROS:   Please see the history of present illness.    All other systems reviewed and are negative.   Prior CV studies:   The following studies were  reviewed today:  Echo 12/24/2019- Study Conclusions   - Procedure narrative: Transthoracic echocardiography. Image  quality was suboptimal, with poor valvular and endocardial  visualization.  - Left ventricle: The cavity size was normal. Systolic function was  normal. The estimated ejection fraction was in the range of 55%  to 60%. Images were inadequate for LV wall motion assessment. The  study was not technically sufficient to allow evaluation of LV  diastolic dysfunction due to atrial fibrillation.  - Left atrium: The atrium was severely dilated.  - Right atrium: The atrium was mildly dilated.  - Pericardium, extracardiac: Small posterior pericardial effusion.   Labs/Other  Tests and Data Reviewed:    EKG:  An ECG dated 08/21/2018 was personally reviewed today and demonstrated:  AF with VR 80  Recent Labs: 04/11/2020: ALT 21; BUN 27; Creat 1.23; Hemoglobin 16.9; Platelets 280; Potassium 5.7; Sodium 144; TSH 1.58   Recent Lipid Panel Lab Results  Component Value Date/Time   CHOL 130 04/11/2020 02:09 PM   CHOL 114 09/13/2019 10:47 AM   TRIG 78 04/11/2020 02:09 PM   HDL 58 04/11/2020 02:09 PM   HDL 44 09/13/2019 10:47 AM   CHOLHDL 2.2 04/11/2020 02:09 PM   LDLCALC 56 04/11/2020 02:09 PM    Wt Readings from Last 3 Encounters:  04/18/20 272 lb (123.4 kg)  04/11/20 269 lb (122 kg)  04/02/20 276 lb (125.2 kg)     Objective:    Vital Signs:  BP 135/82   Pulse 69   Ht 5\' 8"  (1.727 m)   Wt 272 lb (123.4 kg)   BMI 41.36 kg/m    VITAL SIGNS:  reviewed  ASSESSMENT & PLAN:    CAF-  Rate controlled, asymptomatic  Chronic Hypoxic respiratory failure- Multifactorial- Rt heart failure, obesity, chronic O2  Morbid obesity- He has lost 27 lbs since LOV  NIDDM- Per PCP- on oral agents  Chronic anticoagulation- Eliquis- CHADS VASC=4  Hyperkalemia- Stop K+, check BMP in one week.   COVID-19 Education: The signs and symptoms of COVID-19 were discussed  with the patient and how to seek care for testing (follow up with PCP or arrange E-visit).  The importance of social distancing was discussed today.  Time:   Today, I have spent 15 minutes with the patient with telehealth technology discussing the above problems.     Medication Adjustments/Labs and Tests Ordered: Current medicines are reviewed at length with the patient today.  Concerns regarding medicines are outlined above.   Tests Ordered: No orders of the defined types were placed in this encounter.   Medication Changes: No orders of the defined types were placed in this encounter.   Follow Up:  In Person Dr [name] in 6 months  Signed, Kerin Ransom, PA-C  04/18/2020 11:08 AM    [place]

## 2020-04-18 NOTE — Progress Notes (Signed)
Medical screening examination/treatment/procedure(s) were performed by non-physician practitioner and as supervising physician I was immediately available for consultation/collaboration. I agree with above. Bladimir Auman, MD   

## 2020-04-18 NOTE — Patient Instructions (Signed)
Medication Instructions:  PLEASE STOP TAKING POTASSIUM  *If you need a refill on your cardiac medications before your next appointment, please call your pharmacy*  Lab Work: BMET- please return in 1 week for this to be drawn. Lab slip placed in mail,.  If you have labs (blood work) drawn today and your tests are completely normal, you will receive your results only by: Marland Kitchen MyChart Message (if you have MyChart) OR . A paper copy in the mail If you have any lab test that is abnormal or we need to change your treatment, we will call you to review the results.  Testing/Procedures: None Ordered At This Time.   Follow-Up: At [facility], you and your health needs are our priority.  As part of our continuing mission to provide you with exceptional heart care, we have created designated Provider Care Teams.  These Care Teams include your primary Cardiologist (physician) and Advanced Practice Providers (APPs -  Physician Assistants and Nurse Practitioners) who all work together to provide you with the care you need, when you need it.  Your next appointment:   6 month(s)  The format for your next appointment:   In Person  Provider:   Kirk Ruths, MD

## 2020-04-28 DIAGNOSIS — I10 Essential (primary) hypertension: Secondary | ICD-10-CM | POA: Diagnosis not present

## 2020-04-28 DIAGNOSIS — Z7901 Long term (current) use of anticoagulants: Secondary | ICD-10-CM | POA: Diagnosis not present

## 2020-04-28 DIAGNOSIS — I2781 Cor pulmonale (chronic): Secondary | ICD-10-CM | POA: Diagnosis not present

## 2020-04-28 DIAGNOSIS — I4821 Permanent atrial fibrillation: Secondary | ICD-10-CM | POA: Diagnosis not present

## 2020-04-28 LAB — BASIC METABOLIC PANEL
BUN/Creatinine Ratio: 18 (ref 10–24)
BUN: 19 mg/dL (ref 8–27)
CO2: 30 mmol/L — ABNORMAL HIGH (ref 20–29)
Calcium: 9.4 mg/dL (ref 8.6–10.2)
Chloride: 101 mmol/L (ref 96–106)
Creatinine, Ser: 1.04 mg/dL (ref 0.76–1.27)
GFR calc Af Amer: 81 mL/min/{1.73_m2} (ref >59)
GFR calc non Af Amer: 70 mL/min/{1.73_m2} (ref >59)
Glucose: 170 mg/dL — ABNORMAL HIGH (ref 65–99)
Potassium: 3.9 mmol/L (ref 3.5–5.2)
Sodium: 143 mmol/L (ref 134–144)

## 2020-05-15 DIAGNOSIS — D0421 Carcinoma in situ of skin of right ear and external auricular canal: Secondary | ICD-10-CM | POA: Diagnosis not present

## 2020-05-15 DIAGNOSIS — L821 Other seborrheic keratosis: Secondary | ICD-10-CM | POA: Diagnosis not present

## 2020-05-15 DIAGNOSIS — D485 Neoplasm of uncertain behavior of skin: Secondary | ICD-10-CM | POA: Diagnosis not present

## 2020-05-15 DIAGNOSIS — L570 Actinic keratosis: Secondary | ICD-10-CM | POA: Diagnosis not present

## 2020-05-15 DIAGNOSIS — L219 Seborrheic dermatitis, unspecified: Secondary | ICD-10-CM | POA: Diagnosis not present

## 2020-05-19 ENCOUNTER — Other Ambulatory Visit: Payer: Self-pay

## 2020-05-19 ENCOUNTER — Ambulatory Visit (INDEPENDENT_AMBULATORY_CARE_PROVIDER_SITE_OTHER): Payer: Medicare Other | Admitting: Orthotics

## 2020-05-19 ENCOUNTER — Ambulatory Visit: Payer: Medicare Other

## 2020-05-19 DIAGNOSIS — E119 Type 2 diabetes mellitus without complications: Secondary | ICD-10-CM

## 2020-05-19 DIAGNOSIS — M2141 Flat foot [pes planus] (acquired), right foot: Secondary | ICD-10-CM | POA: Diagnosis not present

## 2020-05-19 DIAGNOSIS — M2011 Hallux valgus (acquired), right foot: Secondary | ICD-10-CM

## 2020-05-19 DIAGNOSIS — M2041 Other hammer toe(s) (acquired), right foot: Secondary | ICD-10-CM

## 2020-05-19 DIAGNOSIS — M2142 Flat foot [pes planus] (acquired), left foot: Secondary | ICD-10-CM

## 2020-05-19 DIAGNOSIS — M2012 Hallux valgus (acquired), left foot: Secondary | ICD-10-CM

## 2020-05-19 DIAGNOSIS — M2042 Other hammer toe(s) (acquired), left foot: Secondary | ICD-10-CM | POA: Diagnosis not present

## 2020-05-19 NOTE — Progress Notes (Signed)

## 2020-05-21 ENCOUNTER — Telehealth: Payer: Self-pay | Admitting: Internal Medicine

## 2020-05-21 NOTE — Telephone Encounter (Signed)
Recv'd records from [facility] forwarded records to Dr. Cathlean Cower 10/13/21fbg

## 2020-06-06 ENCOUNTER — Other Ambulatory Visit: Payer: Self-pay

## 2020-06-06 ENCOUNTER — Ambulatory Visit (INDEPENDENT_AMBULATORY_CARE_PROVIDER_SITE_OTHER): Payer: Medicare Other

## 2020-06-06 DIAGNOSIS — Z23 Encounter for immunization: Secondary | ICD-10-CM | POA: Diagnosis not present

## 2020-06-06 DIAGNOSIS — E538 Deficiency of other specified B group vitamins: Secondary | ICD-10-CM | POA: Diagnosis not present

## 2020-06-06 MED ORDER — CYANOCOBALAMIN 1000 MCG/ML IJ SOLN
1000.0000 ug | Freq: Once | INTRAMUSCULAR | Status: AC
  Administered 2020-06-06: 1000 ug via INTRAMUSCULAR

## 2020-06-10 ENCOUNTER — Ambulatory Visit (INDEPENDENT_AMBULATORY_CARE_PROVIDER_SITE_OTHER): Payer: Medicare Other | Admitting: Family Medicine

## 2020-06-10 ENCOUNTER — Other Ambulatory Visit: Payer: Self-pay

## 2020-06-10 ENCOUNTER — Encounter: Payer: Self-pay | Admitting: Family Medicine

## 2020-06-10 DIAGNOSIS — M75121 Complete rotator cuff tear or rupture of right shoulder, not specified as traumatic: Secondary | ICD-10-CM | POA: Diagnosis not present

## 2020-06-10 NOTE — Progress Notes (Signed)
[name] Phone: [phone] Subjective:   I [name] am serving as a Education administrator for Dr. Hulan Saas.  This visit occurred during the SARS-CoV-2 public health emergency.  Safety protocols were in place, including screening questions prior to the visit, additional usage of staff PPE, and extensive cleaning of exam room while observing appropriate contact time as indicated for disinfecting solutions.   I'm seeing this patient by the request  of:  [name], MD  CC: Right shoulder pain follow-up  [id number]   04/02/2020 Patient has what appears to be a fairly full rotator cuff tear of the supraspinatus.  Patient would not be a surgical candidate.  We will try home exercises given injection for the reactive bursitis that was noted on ultrasound today.  Hopeful that this will make some improvement in the pain at the moment.  Increase activity slowly.  Follow-up again in 4 to 8 weeks  Patient given injection, tolerated the procedure well, discussed icing regimen and home exercise, which activities to doing which wants to avoid.  Increase activity slowly.  Patient does have an underlying rotator cuff tear that I think is also going to contribute.  Patient's has many different comorbidities and social determinants of health that make different more aggressive therapies difficult.  Follow-up with me again 2 to 3 months    Update 06/10/2020 [name] [name] is a 74 y.o. male coming in with complaint of right shoulder pain. Patient states he has good and bad days. Open to an injection today.  Patient states that it is, comes and goes at this time.  Does not really stop him from too many activities.  We will continue to stay active as much as he can.       Past Medical History:  Diagnosis Date  . Acute right-sided CHF (congestive heart failure) ([place])    a. 01/2013.  . Arthritis    "knees and hands; thoracic area of the  spine" (01/05/2013)  . BRONCHITIS, CHRONIC 01/02/2009  . COMMON MIGRAINE    "none since treating for high BP"  . Complication of anesthesia    "aspiration pneumonia after hand OR" (01/05/2013)  . DIABETES MELLITUS, TYPE II   . GOUT   . HYPERLIPIDEMIA   . HYPERTENSION   . Long term (current) use of anticoagulants   . Morbid obesity ([place])   . OBESITY HYPOVENTILATION SYNDROME    a. on home O2.  . On home oxygen therapy   . OSA (obstructive sleep apnea)   . Permanent atrial fibrillation ([place])   . PROSTATE SPECIFIC ANTIGEN, ELEVATED 06/09/2007  . Pulmonary HTN ([place])    a. multifactorial including obstructive sleep apnea, obesity hypoventilation syndrome and possible pulmonary venous hypertension.  Marland Kitchen SKIN RASH 03/12/2010  . Unspecified hearing loss    Past Surgical History:  Procedure Laterality Date  . APPENDECTOMY  1974  . CARDIOVERSION  05/21/2008; ~ 06/2008  . HERNIA REPAIR    . INGUINAL HERNIA REPAIR Right   . RIGHT HEART CATHETERIZATION N/A 01/08/2013   Procedure: RIGHT HEART CATH;  Surgeon: Thayer Headings, MD;  Location[facility] CATH LAB;  Service: Cardiovascular;  Laterality: N/A;  . TENDON REPAIR Right 2009   "lacerated his tendon and pulley" Dr. Lenon Curt  . UMBILICAL HERNIA REPAIR     Social History   Socioeconomic History  . Marital status: Widowed    Spouse name: Not on file  . Number of children: 2  .  Years of education: Not on file  . Highest education level: Not on file  Occupational History  . Occupation: Quarry manager: [employer]  Tobacco Use  . Smoking status: Former Smoker    Packs/day: 1.00    Years: 15.00    Pack years: 15.00    Types: Cigarettes    Quit date: 08/09/1978    Years since quitting: 41.8  . Smokeless tobacco: Never Used  . Tobacco comment: 01/05/2013 "quit smoking ~ 40 years ago"  Vaping Use  . Vaping Use: Never used  Substance and Sexual Activity  . Alcohol use: Yes    Alcohol/week: 0.0 standard drinks    Comment:  01/05/2013 "hasn't had a drink since 1980's; never had problem w/it"  . Drug use: No  . Sexual activity: Not Currently  Other Topics Concern  . Not on file  Social History Narrative  . Not on file   Social Determinants of Health   Financial Resource Strain:   . Difficulty of Paying Living Expenses: Not on file  Food Insecurity:   . Worried About Charity fundraiser in the Last Year: Not on file  . Ran Out of Food in the Last Year: Not on file  Transportation Needs:   . Lack of Transportation (Medical): Not on file  . Lack of Transportation (Non-Medical): Not on file  Physical Activity:   . Days of Exercise per Week: Not on file  . Minutes of Exercise per Session: Not on file  Stress:   . Feeling of Stress : Not on file  Social Connections:   . Frequency of Communication with Friends and Family: Not on file  . Frequency of Social Gatherings with Friends and Family: Not on file  . Attends Religious Services: Not on file  . Active Member of Clubs or Organizations: Not on file  . Attends Archivist Meetings: Not on file  . Marital Status: Not on file   Allergies  Allergen Reactions  . Codeine     Altered mental status "goofy"  . Heparin Rash     Abdominal rash 01/2013   Family History  Problem Relation Age of Onset  . Alzheimer's disease Father   . Prostate cancer Father   . Cancer Other        Breast Cancer, <50 yo 1st degree relative  . Coronary artery disease Other        Male, 1st degree relative    Current Outpatient Medications (Endocrine & Metabolic):  .  glipiZIDE (GLUCOTROL XL) 10 MG 24 hr tablet, Take 1 tablet (10 mg total) by mouth daily with breakfast. .  metFORMIN (GLUCOPHAGE-XR) 500 MG 24 hr tablet, 2 tabs by mouth in the AM  Current Outpatient Medications (Cardiovascular):  .  atorvastatin (LIPITOR) 20 MG tablet, Take 1 tablet (20 mg total) by mouth daily. Marland Kitchen  diltiazem (CARDIZEM CD) 180 MG 24 hr capsule, TAKE 1 CAPSULE DAILY (NEED OFFICE  VISIT) .  metoprolol tartrate (LOPRESSOR) 25 MG tablet, Take 1 tablet (25 mg total) by mouth 2 (two) times daily. Marland Kitchen  torsemide (DEMADEX) 20 MG tablet, TAKE 2 TABLETS TWICE A DAY (PLEASE CONTACT OFFICE FOR ADDITIONAL REFILLS)  Current Outpatient Medications (Respiratory):  .  cetirizine (ZYRTEC) 10 MG tablet, Take 10 mg by mouth at bedtime.  Current Outpatient Medications (Analgesics):  .  allopurinol (ZYLOPRIM) 100 MG tablet, Take 1 tablet (100 mg total) by mouth daily.  Current Outpatient Medications (Hematological):  .  cyanocobalamin (,VITAMIN  B-12,) 1000 MCG/ML injection, Inject 1,000 mcg into the muscle. Marland Kitchen  ELIQUIS 5 MG TABS tablet, TAKE 1 TABLET TWICE A DAY  Current Outpatient Medications (Other):  .  glucose blood (ONE TOUCH ULTRA TEST) test strip, Use to check blood sugars once daily Dx E11.9 .  Lancets MISC, Use as directed once daily  E11.9 .  Misc Natural Products (TART CHERRY ADVANCED) CAPS, Take 1 capsule by mouth daily. .  OXYGEN, Inhale 3-5 L into the lungs daily. 3L Daily  4L Resting  5L Exertion   Reviewed prior external information including notes and imaging from  primary care provider As well as notes that were available from care everywhere and other healthcare systems.  Past medical history, social, surgical and family history all reviewed in electronic medical record.  No pertanent information unless stated regarding to the chief complaint.   Review of Systems:  No headache, visual changes, nausea, vomiting, diarrhea, constipation, dizziness, abdominal pain, skin rash, fevers, chills, night sweats, weight loss, swollen lymph nodes, body aches, joint swelling, chest pain,  mood changes. POSITIVE muscle aches, shortness of breath that have been baseline.  Objective  Blood pressure 136/90, pulse 67, height 5\' 8"  (1.727 m), weight 278 lb (126.1 kg), SpO2 92 %.   General: No apparent distress alert and oriented x3 mood and affect normal, dressed appropriately.   Overweight HEENT: Pupils equal, extraocular movements intact  Respiratory: Patient is wearing oxygen at baseline with a nasal cannula Patient's right shoulder does show good range of motion lacking the last 5 degrees of forward flexion and some mild internal and external rotation.  Patient's rotator cuff strength still 4 out of 5 with empty can but 5 out of 5 with internal and external rotation.  Patient does have positive crossover noted.  Positive impingement signs noted with    Impression and Recommendations:     The above documentation has been reviewed and is accurate and complete Lyndal Pulley, DO

## 2020-06-10 NOTE — Patient Instructions (Signed)
Good to see you Keep watching the shoulder Continue exercises Ice after a lot of activity See me again in 3 months

## 2020-06-10 NOTE — Assessment & Plan Note (Signed)
Patient is doing decent at this time.  Once again would not be a surgical candidate.  Patient wants to hold on another injection at this time.  We will continue conservative therapy.  Encouraged him to call me if any worsening pain otherwise follow-up again in 3 months

## 2020-06-12 NOTE — Progress Notes (Signed)
Medical screening examination/treatment/procedure(s) were performed by non-physician practitioner and as supervising physician I was immediately available for consultation/collaboration. I agree with above. Keishla Oyer, MD   

## 2020-06-20 ENCOUNTER — Ambulatory Visit (INDEPENDENT_AMBULATORY_CARE_PROVIDER_SITE_OTHER): Payer: Medicare Other

## 2020-06-20 ENCOUNTER — Encounter: Payer: Self-pay | Admitting: Family

## 2020-06-20 ENCOUNTER — Other Ambulatory Visit: Payer: Self-pay

## 2020-06-20 ENCOUNTER — Ambulatory Visit (INDEPENDENT_AMBULATORY_CARE_PROVIDER_SITE_OTHER): Payer: Medicare Other | Admitting: Family

## 2020-06-20 VITALS — BP 146/88 | HR 82 | Temp 97.8°F | Ht 68.0 in | Wt 281.0 lb

## 2020-06-20 DIAGNOSIS — M545 Low back pain, unspecified: Secondary | ICD-10-CM | POA: Diagnosis not present

## 2020-06-20 DIAGNOSIS — R600 Localized edema: Secondary | ICD-10-CM

## 2020-06-20 DIAGNOSIS — I509 Heart failure, unspecified: Secondary | ICD-10-CM | POA: Diagnosis not present

## 2020-06-20 LAB — COMPREHENSIVE METABOLIC PANEL
ALT: 14 U/L (ref 0–53)
AST: 15 U/L (ref 0–37)
Albumin: 3.6 g/dL (ref 3.5–5.2)
Alkaline Phosphatase: 68 U/L (ref 39–117)
BUN: 19 mg/dL (ref 6–23)
CO2: 35 mEq/L — ABNORMAL HIGH (ref 19–32)
Calcium: 9.7 mg/dL (ref 8.4–10.5)
Chloride: 100 mEq/L (ref 96–112)
Creatinine, Ser: 1 mg/dL (ref 0.40–1.50)
GFR: 74.13 mL/min (ref >60.00)
Glucose, Bld: 84 mg/dL (ref 70–99)
Potassium: 3.9 mEq/L (ref 3.5–5.1)
Sodium: 142 mEq/L (ref 135–145)
Total Bilirubin: 1 mg/dL (ref 0.2–1.2)
Total Protein: 7.1 g/dL (ref 6.0–8.3)

## 2020-06-20 LAB — CBC WITH DIFFERENTIAL/PLATELET
Basophils Absolute: 0.1 10*3/uL (ref 0.0–0.1)
Basophils Relative: 0.6 % (ref 0.0–3.0)
Eosinophils Absolute: 0.2 10*3/uL (ref 0.0–0.7)
Eosinophils Relative: 2 % (ref 0.0–5.0)
HCT: 45.9 % (ref 39.0–52.0)
Hemoglobin: 15 g/dL (ref 13.0–17.0)
Lymphocytes Relative: 25.8 % (ref 12.0–46.0)
Lymphs Abs: 3.1 10*3/uL (ref 0.7–4.0)
MCHC: 32.8 g/dL (ref 30.0–36.0)
MCV: 87 fl (ref 78.0–100.0)
Monocytes Absolute: 0.9 10*3/uL (ref 0.1–1.0)
Monocytes Relative: 7.5 % (ref 3.0–12.0)
Neutro Abs: 7.6 10*3/uL (ref 1.4–7.7)
Neutrophils Relative %: 64.1 % (ref 43.0–77.0)
Platelets: 244 10*3/uL (ref 150.0–400.0)
RBC: 5.28 Mil/uL (ref 4.22–5.81)
RDW: 16.9 % — ABNORMAL HIGH (ref 11.5–15.5)
WBC: 11.9 10*3/uL — ABNORMAL HIGH (ref 4.0–10.5)

## 2020-06-20 LAB — BRAIN NATRIURETIC PEPTIDE: Pro B Natriuretic peptide (BNP): 108 pg/mL — ABNORMAL HIGH (ref 0.0–100.0)

## 2020-06-20 MED ORDER — TIZANIDINE HCL 2 MG PO CAPS: 2.0000 mg | ORAL_CAPSULE | Freq: Three times a day (TID) | ORAL | 0 refills | Status: DC

## 2020-06-20 NOTE — Progress Notes (Signed)
[name] [name] is a 74 y.o. male with the following history as recorded in EpicCare:  Patient Active Problem List   Diagnosis Date Noted  . Lesion of right ear 04/13/2020  . Arthritis of right acromioclavicular joint 04/02/2020  . Right rotator cuff tear 04/02/2020  . Greater trochanteric bursitis, right 08/14/2019  . Tick bite, infected 12/19/2018  . Degenerative arthritis of right knee 02/21/2017  . Right knee pain 02/15/2017  . Volume overload 12/23/2015  . OSA treated with BiPAP 12/23/2015  . Pulmonary hypertension ([place]) 12/23/2015  . Gout 12/23/2015  . Cellulitis of leg, right 02/14/2015  . Allergic rhinitis, cause unspecified 11/21/2013  . Chronic cor pulmonale ([place]) 12/22/2012  . DOE (dyspnea on exertion) 12/20/2012  . Left knee pain 07/11/2012  . Increased prostate specific antigen (PSA) velocity 01/06/2012  . Preventative health care 12/25/2010  . Long term (current) use of anticoagulants 11/06/2010  . HEMATOCHEZIA 06/26/2010  . BRONCHITIS, CHRONIC 01/02/2009  . Obesity hypoventilation syndrome ([place]) 11/26/2008  . Permanent atrial fibrillation 10/16/2008  . PERIPHERAL EDEMA 01/05/2008  . Non-insulin dependent type 2 diabetes mellitus ([place]) 06/09/2007  . COMMON MIGRAINE 06/09/2007  . Unspecified hearing loss 06/09/2007  . PROSTATE SPECIFIC ANTIGEN, ELEVATED 06/09/2007  . Hyperlipidemia 02/18/2007  . Severe obesity (BMI >= 40) ([place]) 02/18/2007  . Essential hypertension 02/18/2007    Current Outpatient Medications  Medication Sig Dispense Refill  . allopurinol (ZYLOPRIM) 100 MG tablet Take 1 tablet (100 mg total) by mouth daily. 90 tablet 3  . atorvastatin (LIPITOR) 20 MG tablet Take 1 tablet (20 mg total) by mouth daily. 90 tablet 3  . cetirizine (ZYRTEC) 10 MG tablet Take 10 mg by mouth at bedtime.    . cyanocobalamin (,VITAMIN B-12,) 1000 MCG/ML injection Inject 1,000 mcg into the muscle.    . diltiazem (CARDIZEM CD) 180 MG 24 hr capsule TAKE 1 CAPSULE DAILY (NEED  OFFICE VISIT) 90 capsule 3  . ELIQUIS 5 MG TABS tablet TAKE 1 TABLET TWICE A DAY 180 tablet 3  . glipiZIDE (GLUCOTROL XL) 10 MG 24 hr tablet Take 1 tablet (10 mg total) by mouth daily with breakfast. 90 tablet 3  . glucose blood (ONE TOUCH ULTRA TEST) test strip Use to check blood sugars once daily Dx E11.9 100 each 2  . Lancets MISC Use as directed once daily  E11.9 100 each 12  . metFORMIN (GLUCOPHAGE-XR) 500 MG 24 hr tablet 2 tabs by mouth in the AM 180 tablet 3  . metoprolol tartrate (LOPRESSOR) 25 MG tablet Take 1 tablet (25 mg total) by mouth 2 (two) times daily. 180 tablet 3  . Misc Natural Products (TART CHERRY ADVANCED) CAPS Take 1 capsule by mouth daily.    . OXYGEN Inhale 3-5 L into the lungs daily. 3L Daily  4L Resting  5L Exertion    . torsemide (DEMADEX) 20 MG tablet TAKE 2 TABLETS TWICE A DAY (PLEASE CONTACT OFFICE FOR ADDITIONAL REFILLS) 120 tablet 0  . tizanidine (ZANAFLEX) 2 MG capsule Take 1 capsule (2 mg total) by mouth 3 (three) times daily. 30 capsule 0   No current facility-administered medications for this visit.    Allergies: Codeine and Heparin  Past Medical History:  Diagnosis Date  . Acute right-sided CHF (congestive heart failure) ([place])    a. 01/2013.  . Arthritis    "knees and hands; thoracic area of the spine" (01/05/2013)  . BRONCHITIS, CHRONIC 01/02/2009  . COMMON MIGRAINE    "none since treating for high BP"  .  Complication of anesthesia    "aspiration pneumonia after hand OR" (01/05/2013)  . DIABETES MELLITUS, TYPE II   . GOUT   . HYPERLIPIDEMIA   . HYPERTENSION   . Long term (current) use of anticoagulants   . Morbid obesity ([place])   . OBESITY HYPOVENTILATION SYNDROME    a. on home O2.  . On home oxygen therapy   . OSA (obstructive sleep apnea)   . Permanent atrial fibrillation ([place])   . PROSTATE SPECIFIC ANTIGEN, ELEVATED 06/09/2007  . Pulmonary HTN ([place])    a. multifactorial including obstructive sleep apnea, obesity hypoventilation syndrome  and possible pulmonary venous hypertension.  Marland Kitchen SKIN RASH 03/12/2010  . Unspecified hearing loss     Past Surgical History:  Procedure Laterality Date  . APPENDECTOMY  1974  . CARDIOVERSION  05/21/2008; ~ 06/2008  . HERNIA REPAIR    . INGUINAL HERNIA REPAIR Right   . RIGHT HEART CATHETERIZATION N/A 01/08/2013   Procedure: RIGHT HEART CATH;  Surgeon: Thayer Headings, MD;  Location: [facility] CATH LAB;  Service: Cardiovascular;  Laterality: N/A;  . TENDON REPAIR Right 2009   "lacerated his tendon and pulley" Dr. Lenon Curt  . UMBILICAL HERNIA REPAIR      Family History  Problem Relation Age of Onset  . Alzheimer's disease Father   . Prostate cancer Father   . Cancer Other        Breast Cancer, <50 yo 1st degree relative  . Coronary artery disease Other        Male, 1st degree relative    Social History   Tobacco Use  . Smoking status: Former Smoker    Packs/day: 1.00    Years: 15.00    Pack years: 15.00    Types: Cigarettes    Quit date: 08/09/1978    Years since quitting: 41.8  . Smokeless tobacco: Never Used  . Tobacco comment: 01/05/2013 "quit smoking ~ 40 years ago"  Substance Use Topics  . Alcohol use: Yes    Alcohol/week: 0.0 standard drinks    Comment: 01/05/2013 "hasn't had a drink since 1980's; never had problem w/it"    Subjective:   Accompanied by his daughter who helps provide the history;  1) Low back pain x 1 week; more noticeable with movement; seemed to start after helping with [employer] at his church- did heavy stirring and symptoms started soon after this; 2) Both patient and daughter are concerned about weight gain since September; is up 9 pounds; daughter feels that breathing is heavier and patient feels that his ankles are more swollen; does take 40 mg of Demadex twice a day; has not seen his cardiologist in person since earlier this year;     Objective:  Vitals:   06/20/20 1449  BP: (!) 146/88  Pulse: 82  Temp: 97.8 F (36.6 C)  TempSrc: Oral  SpO2: 96%   Weight: 281 lb (127.5 kg)  Height: '5\' 8"'  (1.727 m)    General: Well developed, well nourished, in no acute distress  Skin : Warm and dry.  Head: Normocephalic and atraumatic  Eyes: Sclera and conjunctiva clear; pupils round and reactive to light; extraocular movements intact  Ears: External normal; canals clear; tympanic membranes normal  Oropharynx: Pink, supple. No suspicious lesions  Neck: Supple without thyromegaly, adenopathy  Lungs: Respirations unlabored; on oxygen;  Extremities: bilateral edema- not pitting Vessels: Symmetric bilaterally  Neurologic: Alert and oriented; speech intact; face symmetrical; moves all extremities well; CNII-XII intact without focal deficit   Assessment:  1. Pedal edema   2. Acute bilateral low back pain without sciatica     Plan:  1. Concern for CHF flare; will update labs and CXR today; can take 60 mg of Torsemide in am and 40 mg in pm x 2 days and then back to 40 mg bid; needs to do daily weights; will most likely need to see cardiology in person; 2. Suspect muscular; Rx for Tizanidine 2 mg tid prn;  This visit occurred during the SARS-CoV-2 public health emergency.  Safety protocols were in place, including screening questions prior to the visit, additional usage of staff PPE, and extensive cleaning of exam room while observing appropriate contact time as indicated for disinfecting solutions.     No follow-ups on file.  Orders Placed This Encounter  Procedures  . DG Chest 2 View    Standing Status:   Future    Number of Occurrences:   1    Standing Expiration Date:   06/20/2021    Order Specific Question:   Reason for Exam (SYMPTOM  OR DIAGNOSIS REQUIRED)    Answer:   pedal edema    Order Specific Question:   Preferred imaging location?    Answer:   [facility]  . CBC with Differential/Platelet    Standing Status:   Future    Number of Occurrences:   1    Standing Expiration Date:   06/20/2021  . Comp Met (CMET)    Standing  Status:   Future    Number of Occurrences:   1    Standing Expiration Date:   06/20/2021  . B Nat Peptide    Standing Status:   Future    Number of Occurrences:   1    Standing Expiration Date:   06/20/2021    Requested Prescriptions   Signed Prescriptions Disp Refills  . tizanidine (ZANAFLEX) 2 MG capsule 30 capsule 0    Sig: Take 1 capsule (2 mg total) by mouth 3 (three) times daily.

## 2020-06-20 NOTE — Patient Instructions (Signed)
Per cardiology, try taking 60 mg of Torsemide in the am and 40 mg in the pm x 2 days; will see what labs show and discuss further on Monday;

## 2020-06-23 ENCOUNTER — Telehealth: Payer: Self-pay

## 2020-06-23 NOTE — Telephone Encounter (Signed)
Called to go over results and follow up with patient however patient was unavailable.

## 2020-06-24 ENCOUNTER — Encounter: Payer: Self-pay | Admitting: Podiatry

## 2020-06-24 ENCOUNTER — Ambulatory Visit (INDEPENDENT_AMBULATORY_CARE_PROVIDER_SITE_OTHER): Payer: Medicare Other | Admitting: Podiatry

## 2020-06-24 ENCOUNTER — Other Ambulatory Visit: Payer: Self-pay

## 2020-06-24 DIAGNOSIS — B351 Tinea unguium: Secondary | ICD-10-CM

## 2020-06-24 DIAGNOSIS — M2142 Flat foot [pes planus] (acquired), left foot: Secondary | ICD-10-CM

## 2020-06-24 DIAGNOSIS — M2141 Flat foot [pes planus] (acquired), right foot: Secondary | ICD-10-CM

## 2020-06-24 DIAGNOSIS — M79674 Pain in right toe(s): Secondary | ICD-10-CM

## 2020-06-24 DIAGNOSIS — M2011 Hallux valgus (acquired), right foot: Secondary | ICD-10-CM

## 2020-06-24 DIAGNOSIS — M2012 Hallux valgus (acquired), left foot: Secondary | ICD-10-CM

## 2020-06-24 DIAGNOSIS — E119 Type 2 diabetes mellitus without complications: Secondary | ICD-10-CM

## 2020-06-24 DIAGNOSIS — M79675 Pain in left toe(s): Secondary | ICD-10-CM

## 2020-06-27 NOTE — Progress Notes (Signed)
Subjective:  Patient ID: [name] [name], male    DOB: 07/20/1946,  MRN: [id number]  74 y.o. male presents with preventative diabetic foot care and painful thick toenails that are difficult to trim. Pain interferes with ambulation. Aggravating factors include wearing enclosed shoe gear. Pain is relieved with periodic professional debridement.   His daughter is present during today's visit. He states he twisted his back making and serving [employer] for an event. Therefore, he cannot sit up in the treatment chair completely.  They voice no other pedal concerns on today's visit.  Review of Systems: In addition to above, he is on supplemental oxygen. Past Medical History:  Diagnosis Date  . Acute right-sided CHF (congestive heart failure) ([place])    a. 01/2013.  . Arthritis    "knees and hands; thoracic area of the spine" (01/05/2013)  . BRONCHITIS, CHRONIC 01/02/2009  . COMMON MIGRAINE    "none since treating for high BP"  . Complication of anesthesia    "aspiration pneumonia after hand OR" (01/05/2013)  . DIABETES MELLITUS, TYPE II   . GOUT   . HYPERLIPIDEMIA   . HYPERTENSION   . Long term (current) use of anticoagulants   . Morbid obesity ([place])   . OBESITY HYPOVENTILATION SYNDROME    a. on home O2.  . On home oxygen therapy   . OSA (obstructive sleep apnea)   . Permanent atrial fibrillation ([place])   . PROSTATE SPECIFIC ANTIGEN, ELEVATED 06/09/2007  . Pulmonary HTN ([place])    a. multifactorial including obstructive sleep apnea, obesity hypoventilation syndrome and possible pulmonary venous hypertension.  Marland Kitchen SKIN RASH 03/12/2010  . Unspecified hearing loss    Past Surgical History:  Procedure Laterality Date  . APPENDECTOMY  1974  . CARDIOVERSION  05/21/2008; ~ 06/2008  . HERNIA REPAIR    . INGUINAL HERNIA REPAIR Right   . RIGHT HEART CATHETERIZATION N/A 01/08/2013   Procedure: RIGHT HEART CATH;  Surgeon: Thayer Headings, MD;  Location[facility] CATH LAB;  Service: Cardiovascular;  Laterality:  N/A;  . TENDON REPAIR Right 2009   "lacerated his tendon and pulley" Dr. Lenon Curt  . UMBILICAL HERNIA REPAIR     Patient Active Problem List   Diagnosis Date Noted  . Lesion of right ear 04/13/2020  . Arthritis of right acromioclavicular joint 04/02/2020  . Right rotator cuff tear 04/02/2020  . Greater trochanteric bursitis, right 08/14/2019  . Tick bite, infected 12/19/2018  . Degenerative arthritis of right knee 02/21/2017  . Right knee pain 02/15/2017  . Volume overload 12/23/2015  . OSA treated with BiPAP 12/23/2015  . Pulmonary hypertension ([place]) 12/23/2015  . Gout 12/23/2015  . Cellulitis of leg, right 02/14/2015  . Allergic rhinitis, cause unspecified 11/21/2013  . Chronic cor pulmonale ([place]) 12/22/2012  . DOE (dyspnea on exertion) 12/20/2012  . Left knee pain 07/11/2012  . Increased prostate specific antigen (PSA) velocity 01/06/2012  . Preventative health care 12/25/2010  . Long term (current) use of anticoagulants 11/06/2010  . HEMATOCHEZIA 06/26/2010  . BRONCHITIS, CHRONIC 01/02/2009  . Obesity hypoventilation syndrome ([place]) 11/26/2008  . Permanent atrial fibrillation 10/16/2008  . PERIPHERAL EDEMA 01/05/2008  . Non-insulin dependent type 2 diabetes mellitus ([place]) 06/09/2007  . COMMON MIGRAINE 06/09/2007  . Unspecified hearing loss 06/09/2007  . PROSTATE SPECIFIC ANTIGEN, ELEVATED 06/09/2007  . Hyperlipidemia 02/18/2007  . Severe obesity (BMI >= 40) ([place]) 02/18/2007  . Essential hypertension 02/18/2007    Current Outpatient Medications:  .  allopurinol (ZYLOPRIM) 100 MG tablet, Take 1 tablet (  100 mg total) by mouth daily., Disp: 90 tablet, Rfl: 3 .  atorvastatin (LIPITOR) 20 MG tablet, Take 1 tablet (20 mg total) by mouth daily., Disp: 90 tablet, Rfl: 3 .  cetirizine (ZYRTEC) 10 MG tablet, Take 10 mg by mouth at bedtime., Disp: , Rfl:  .  cyanocobalamin (,VITAMIN B-12,) 1000 MCG/ML injection, Inject 1,000 mcg into the muscle., Disp: , Rfl:  .  diltiazem (CARDIZEM  CD) 180 MG 24 hr capsule, TAKE 1 CAPSULE DAILY (NEED OFFICE VISIT), Disp: 90 capsule, Rfl: 3 .  ELIQUIS 5 MG TABS tablet, TAKE 1 TABLET TWICE A DAY, Disp: 180 tablet, Rfl: 3 .  glipiZIDE (GLUCOTROL XL) 10 MG 24 hr tablet, Take 1 tablet (10 mg total) by mouth daily with breakfast., Disp: 90 tablet, Rfl: 3 .  glucose blood (ONE TOUCH ULTRA TEST) test strip, Use to check blood sugars once daily Dx E11.9, Disp: 100 each, Rfl: 2 .  Lancets MISC, Use as directed once daily  E11.9, Disp: 100 each, Rfl: 12 .  metFORMIN (GLUCOPHAGE-XR) 500 MG 24 hr tablet, 2 tabs by mouth in the AM, Disp: 180 tablet, Rfl: 3 .  metoprolol tartrate (LOPRESSOR) 25 MG tablet, Take 1 tablet (25 mg total) by mouth 2 (two) times daily., Disp: 180 tablet, Rfl: 3 .  Misc Natural Products (TART CHERRY ADVANCED) CAPS, Take 1 capsule by mouth daily., Disp: , Rfl:  .  OXYGEN, Inhale 3-5 L into the lungs daily. 3L Daily  4L Resting  5L Exertion, Disp: , Rfl:  .  tizanidine (ZANAFLEX) 2 MG capsule, Take 1 capsule (2 mg total) by mouth 3 (three) times daily., Disp: 30 capsule, Rfl: 0 .  torsemide (DEMADEX) 20 MG tablet, TAKE 2 TABLETS TWICE A DAY (PLEASE CONTACT OFFICE FOR ADDITIONAL REFILLS), Disp: 120 tablet, Rfl: 0 Allergies  Allergen Reactions  . Codeine     Altered mental status "goofy"  . Heparin Rash     Abdominal rash 01/2013   Social History   Tobacco Use  Smoking Status Former Smoker  . Packs/day: 1.00  . Years: 15.00  . Pack years: 15.00  . Types: Cigarettes  . Quit date: 08/09/1978  . Years since quitting: 41.9  Smokeless Tobacco Never Used  Tobacco Comment   01/05/2013 "quit smoking ~ 40 years ago"   Objective:  There were no vitals filed for this visit. Constitutional Patient is a pleasant 74 y.o. Caucasian male in NAD.Marland Kitchen AAO x 3.  Vascular Capillary fill time to digits <3 seconds b/l lower extremities. Palpable pedal pulses b/l LE. Pedal hair sparse. Lower extremity skin temperature gradient within normal limits.  Dependent rubor noted b/l lower extremities. No pain with calf compression b/l. Nonpitting edema noted b/l lower extremities. No cyanosis or clubbing noted.  Neurologic Normal speech. Oriented to person, place, and time. Protective sensation intact 5/5 intact bilaterally with 10g monofilament b/l. Vibratory sensation intact b/l. Proprioception intact bilaterally.  Dermatologic Pedal skin with normal turgor, texture and tone bilaterally. No open wounds bilaterally. No interdigital macerations bilaterally. Toenails 1-5 b/l elongated, discolored, dystrophic, thickened, crumbly with subungual debris and tenderness to dorsal palpation.  Orthopedic: Normal muscle strength 5/5 to all lower extremity muscle groups bilaterally. No pain crepitus or joint limitation noted with ROM b/l. Hallux valgus with bunion deformity noted b/l lower extremities. Hammertoes noted to the L 2nd toe. Pes planus deformity noted b/l.    Assessment:   1. Pain due to onychomycosis of toenails of both feet   2. Hallux valgus, acquired,  bilateral   3. Pes planus of both feet   4. Non-insulin dependent type 2 diabetes mellitus ([place])    Plan:  Patient was evaluated and treated and all questions answered.  Onychomycosis with pain -Nails palliatively debridement as below. -Educated on self-care  Procedure: Nail Debridement Rationale: Pain Type of Debridement: manual, sharp debridement. Instrumentation: Nail nipper, rotary burr. Number of Nails: 10  -Examined patient. -His diabetic shoes are on back order. We will call him when they arrive. -No new findings. No new orders. -Continue diabetic foot care principles. -Toenails 1-5 b/l were debrided in length and girth with sterile nail nippers and dremel without iatrogenic bleeding.  -Patient to report any pedal injuries to medical professional immediately. -Patient to continue soft, supportive shoe gear daily. -Patient/POA to call should there be question/concern in the  interim.  Return in about 3 months (around 09/24/2020) for diabetic foot care.  Marzetta Board, DPM

## 2020-07-10 DIAGNOSIS — D0421 Carcinoma in situ of skin of right ear and external auricular canal: Secondary | ICD-10-CM | POA: Diagnosis not present

## 2020-07-11 ENCOUNTER — Ambulatory Visit (INDEPENDENT_AMBULATORY_CARE_PROVIDER_SITE_OTHER): Payer: Medicare Other

## 2020-07-11 ENCOUNTER — Other Ambulatory Visit: Payer: Self-pay

## 2020-07-11 DIAGNOSIS — E538 Deficiency of other specified B group vitamins: Secondary | ICD-10-CM

## 2020-07-11 MED ORDER — CYANOCOBALAMIN 1000 MCG/ML IJ SOLN
1000.0000 ug | Freq: Once | INTRAMUSCULAR | Status: AC
  Administered 2020-07-11: 1000 ug via INTRAMUSCULAR

## 2020-07-11 NOTE — Progress Notes (Signed)
B12 injection given.  Next injection due on or around 08/11/20.

## 2020-07-15 ENCOUNTER — Encounter: Payer: Self-pay | Admitting: Internal Medicine

## 2020-07-15 NOTE — Telephone Encounter (Signed)
Please advise 

## 2020-08-11 ENCOUNTER — Ambulatory Visit: Payer: Medicare Other

## 2020-08-11 ENCOUNTER — Telehealth: Payer: Self-pay

## 2020-08-11 NOTE — Telephone Encounter (Signed)
Pt last OV with PCP 04/11/20 & 10/03/19, not addressed either visit. Last B12 lab 12/22/18.  Will need updated order or pt need physical appt.  Will forward to PCP for clarification.

## 2020-08-11 NOTE — Telephone Encounter (Signed)
Pt will need ROV since medicare does not pay for labs prior to a visit, nor does it pay for physical exams

## 2020-08-11 NOTE — Telephone Encounter (Signed)
Pt called in attempt to schedule appt for OV for B12 labs evaluation.  LVM to call office back to schedule appt.

## 2020-09-12 ENCOUNTER — Other Ambulatory Visit: Payer: Self-pay

## 2020-09-12 ENCOUNTER — Ambulatory Visit (INDEPENDENT_AMBULATORY_CARE_PROVIDER_SITE_OTHER): Payer: Medicare Other

## 2020-09-12 ENCOUNTER — Encounter: Payer: Self-pay | Admitting: Family Medicine

## 2020-09-12 ENCOUNTER — Ambulatory Visit (INDEPENDENT_AMBULATORY_CARE_PROVIDER_SITE_OTHER): Payer: Medicare Other | Admitting: Family Medicine

## 2020-09-12 VITALS — BP 120/82 | HR 94 | Ht 68.0 in | Wt 274.0 lb

## 2020-09-12 DIAGNOSIS — M79604 Pain in right leg: Secondary | ICD-10-CM | POA: Diagnosis not present

## 2020-09-12 DIAGNOSIS — R29898 Other symptoms and signs involving the musculoskeletal system: Secondary | ICD-10-CM | POA: Insufficient documentation

## 2020-09-12 DIAGNOSIS — M79605 Pain in left leg: Secondary | ICD-10-CM

## 2020-09-12 DIAGNOSIS — M545 Low back pain, unspecified: Secondary | ICD-10-CM | POA: Diagnosis not present

## 2020-09-12 DIAGNOSIS — M2550 Pain in unspecified joint: Secondary | ICD-10-CM | POA: Diagnosis not present

## 2020-09-12 DIAGNOSIS — M1711 Unilateral primary osteoarthritis, right knee: Secondary | ICD-10-CM

## 2020-09-12 LAB — CBC WITH DIFFERENTIAL/PLATELET
Basophils Absolute: 0.1 10*3/uL (ref 0.0–0.1)
Basophils Relative: 0.7 % (ref 0.0–3.0)
Eosinophils Absolute: 0.3 10*3/uL (ref 0.0–0.7)
Eosinophils Relative: 2.4 % (ref 0.0–5.0)
HCT: 44.1 % (ref 39.0–52.0)
Hemoglobin: 14.2 g/dL (ref 13.0–17.0)
Lymphocytes Relative: 20.7 % (ref 12.0–46.0)
Lymphs Abs: 2.3 10*3/uL (ref 0.7–4.0)
MCHC: 32.3 g/dL (ref 30.0–36.0)
MCV: 87.1 fl (ref 78.0–100.0)
Monocytes Absolute: 0.8 10*3/uL (ref 0.1–1.0)
Monocytes Relative: 7.3 % (ref 3.0–12.0)
Neutro Abs: 7.5 10*3/uL (ref 1.4–7.7)
Neutrophils Relative %: 68.9 % (ref 43.0–77.0)
Platelets: 312 10*3/uL (ref 150.0–400.0)
RBC: 5.07 Mil/uL (ref 4.22–5.81)
RDW: 15.7 % — ABNORMAL HIGH (ref 11.5–15.5)
WBC: 10.9 10*3/uL — ABNORMAL HIGH (ref 4.0–10.5)

## 2020-09-12 LAB — COMPREHENSIVE METABOLIC PANEL
ALT: 13 U/L (ref 0–53)
AST: 14 U/L (ref 0–37)
Albumin: 3.6 g/dL (ref 3.5–5.2)
Alkaline Phosphatase: 74 U/L (ref 39–117)
BUN: 14 mg/dL (ref 6–23)
CO2: 38 mEq/L — ABNORMAL HIGH (ref 19–32)
Calcium: 9.9 mg/dL (ref 8.4–10.5)
Chloride: 98 mEq/L (ref 96–112)
Creatinine, Ser: 1 mg/dL (ref 0.40–1.50)
GFR: 74.01 mL/min (ref >60.00)
Glucose, Bld: 173 mg/dL — ABNORMAL HIGH (ref 70–99)
Potassium: 3.3 mEq/L — ABNORMAL LOW (ref 3.5–5.1)
Sodium: 142 mEq/L (ref 135–145)
Total Bilirubin: 1.1 mg/dL (ref 0.2–1.2)
Total Protein: 7.8 g/dL (ref 6.0–8.3)

## 2020-09-12 LAB — VITAMIN D 25 HYDROXY (VIT D DEFICIENCY, FRACTURES): VITD: 25.65 ng/mL — ABNORMAL LOW (ref 30.00–100.00)

## 2020-09-12 LAB — SEDIMENTATION RATE: Sed Rate: 79 mm/hr — ABNORMAL HIGH (ref 0–20)

## 2020-09-12 LAB — BRAIN NATRIURETIC PEPTIDE: Pro B Natriuretic peptide (BNP): 92 pg/mL (ref 0.0–100.0)

## 2020-09-12 LAB — TSH: TSH: 1.87 u[IU]/mL (ref 0.35–4.50)

## 2020-09-12 NOTE — Patient Instructions (Signed)
Labs today Xray today Home Health PT will call you See me again in 4-6 weeks, if things worsen seek medical attention at ER immediately

## 2020-09-12 NOTE — Assessment & Plan Note (Signed)
Patient does have weakness of the lower extremities.  Seems to be mostly of the hip flexion.  Patient does have some mild back pain and will get x-rays.  I do believe though that most of this is likely secondary to more deconditioning.  Patient has many different comorbidities that could be contributing.  I would like to get x-rays of the back as well as laboratory work-up to further evaluate.  If this seems to be relatively normal then I do think that home health with formal physical therapy would be beneficial for this individual.  Patient wants to avoid going to other site secondary to Covid and difficulty with transportation.  Encourage patient as well to follow-up with his other provider secondary to his other comorbidities if he continues to have difficulties.  Follow-up with me again in 6 to 8 weeks.

## 2020-09-12 NOTE — Progress Notes (Signed)
[place] [facility] [place] Phone: [phone] Subjective:   [name] [name], am serving as a scribe for Dr. Hulan Saas. This visit occurred during the SARS-CoV-2 public health emergency.  Safety protocols were in place, including screening questions prior to the visit, additional usage of staff PPE, and extensive cleaning of exam room while observing appropriate contact time as indicated for disinfecting solutions.   I'm seeing this patient by the request  of:  [name], MD  CC: Allover pain  XBD:[id number]   06/10/2020 Patient is doing decent at this time.  Once again would not be a surgical candidate.  Patient wants to hold on another injection at this time.  We will continue conservative therapy.  Encouraged him to call me if any worsening pain otherwise follow-up again in 3 months  Update 09/12/2020 [name] [name] is a 75 y.o. male coming in with complaint of right shoulder, right knee pain. States that 3 months ago he noticed weakness and pain in his legs that is progressively getting worse. States that he is having a hard time walking due to pain and weakness. Right knee pain is worse when it is cold. He puts a blanket over the knee to reduce pain.  Patient states that the shoulder seems to be doing decent overall compared to other things.  Patient is more concerned with the weakness that he is having.  Patient does have 5 steps in and out of the house that he has to do it may have become very difficult.  Patient denies any fevers, chills, any chest pain.  Patient is on chronic oxygen but has not needed anymore.  Patient is accompanied with daughter who states that he just seems more fatigued than usual.     Past Medical History:  Diagnosis Date  . Acute right-sided CHF (congestive heart failure) ([place])    a. 01/2013.  . Arthritis    "knees and hands; thoracic area of the spine" (01/05/2013)  . BRONCHITIS, CHRONIC 01/02/2009  .  COMMON MIGRAINE    "none since treating for high BP"  . Complication of anesthesia    "aspiration pneumonia after hand OR" (01/05/2013)  . DIABETES MELLITUS, TYPE II   . GOUT   . HYPERLIPIDEMIA   . HYPERTENSION   . Long term (current) use of anticoagulants   . Morbid obesity ([place])   . OBESITY HYPOVENTILATION SYNDROME    a. on home O2.  . On home oxygen therapy   . OSA (obstructive sleep apnea)   . Permanent atrial fibrillation ([place])   . PROSTATE SPECIFIC ANTIGEN, ELEVATED 06/09/2007  . Pulmonary HTN ([place])    a. multifactorial including obstructive sleep apnea, obesity hypoventilation syndrome and possible pulmonary venous hypertension.  Marland Kitchen SKIN RASH 03/12/2010  . Unspecified hearing loss    Past Surgical History:  Procedure Laterality Date  . APPENDECTOMY  1974  . CARDIOVERSION  05/21/2008; ~ 06/2008  . HERNIA REPAIR    . INGUINAL HERNIA REPAIR Right   . RIGHT HEART CATHETERIZATION N/A 01/08/2013   Procedure: RIGHT HEART CATH;  Surgeon: Thayer Headings, MD;  Location: [facility] CATH LAB;  Service: Cardiovascular;  Laterality: N/A;  . TENDON REPAIR Right 2009   "lacerated his tendon and pulley" Dr. Lenon Curt  . UMBILICAL HERNIA REPAIR     Social History   Socioeconomic History  . Marital status: Widowed    Spouse name: Not on file  . Number of children: 2  .  Years of education: Not on file  . Highest education level: Not on file  Occupational History  . Occupation: Quarry manager: [employer]  Tobacco Use  . Smoking status: Former Smoker    Packs/day: 1.00    Years: 15.00    Pack years: 15.00    Types: Cigarettes    Quit date: 08/09/1978    Years since quitting: 42.1  . Smokeless tobacco: Never Used  . Tobacco comment: 01/05/2013 "quit smoking ~ 40 years ago"  Vaping Use  . Vaping Use: Never used  Substance and Sexual Activity  . Alcohol use: Yes    Alcohol/week: 0.0 standard drinks    Comment: 01/05/2013 "hasn't had a drink since 1980's; never had problem  w/it"  . Drug use: [name]  . Sexual activity: Not Currently  Other Topics Concern  . Not on file  Social History Narrative  . Not on file   Social Determinants of Health   Financial Resource Strain: Not on file  Food Insecurity: Not on file  Transportation Needs: Not on file  Physical Activity: Not on file  Stress: Not on file  Social Connections: Not on file   Allergies  Allergen Reactions  . Codeine     Altered mental status "goofy"  . Heparin Rash     Abdominal rash 01/2013   Family History  Problem Relation Age of Onset  . Alzheimer's disease Father   . Prostate cancer Father   . Cancer Other        Breast Cancer, <50 yo 1st degree relative  . Coronary artery disease Other        Male, 1st degree relative    Current Outpatient Medications (Endocrine & Metabolic):  .  glipiZIDE (GLUCOTROL XL) 10 MG 24 hr tablet, Take 1 tablet (10 mg total) by mouth daily with breakfast. .  metFORMIN (GLUCOPHAGE-XR) 500 MG 24 hr tablet, 2 tabs by mouth in the AM  Current Outpatient Medications (Cardiovascular):  .  atorvastatin (LIPITOR) 20 MG tablet, Take 1 tablet (20 mg total) by mouth daily. Marland Kitchen  diltiazem (CARDIZEM CD) 180 MG 24 hr capsule, TAKE 1 CAPSULE DAILY (NEED OFFICE VISIT) .  metoprolol tartrate (LOPRESSOR) 25 MG tablet, Take 1 tablet (25 mg total) by mouth 2 (two) times daily. Marland Kitchen  torsemide (DEMADEX) 20 MG tablet, TAKE 2 TABLETS TWICE A DAY (PLEASE CONTACT OFFICE FOR ADDITIONAL REFILLS)  Current Outpatient Medications (Respiratory):  .  cetirizine (ZYRTEC) 10 MG tablet, Take 10 mg by mouth at bedtime.  Current Outpatient Medications (Analgesics):  .  allopurinol (ZYLOPRIM) 100 MG tablet, Take 1 tablet (100 mg total) by mouth daily.  Current Outpatient Medications (Hematological):  .  cyanocobalamin (,VITAMIN B-12,) 1000 MCG/ML injection, Inject 1,000 mcg into the muscle. Marland Kitchen  ELIQUIS 5 MG TABS tablet, TAKE 1 TABLET TWICE A DAY  Current Outpatient Medications (Other):  .   glucose blood (ONE TOUCH ULTRA TEST) test strip, Use to check blood sugars once daily Dx E11.9 .  Lancets MISC, Use as directed once daily  E11.9 .  Misc Natural Products (TART CHERRY ADVANCED) CAPS, Take 1 capsule by mouth daily. .  OXYGEN, Inhale 3-5 L into the lungs daily. 3L Daily  4L Resting  5L Exertion .  tizanidine (ZANAFLEX) 2 MG capsule, Take 1 capsule (2 mg total) by mouth 3 (three) times daily.   Reviewed prior external information including notes and imaging from  primary care provider As well as notes that were available from  care everywhere and other healthcare systems.  Past medical history, social, surgical and family history all reviewed in electronic medical record.  [name] pertanent information unless stated regarding to the chief complaint.   Review of Systems:  [name] headache, visual changes, nausea, vomiting, diarrhea, constipation, dizziness, abdominal pain, skin rash, fevers, chills, night sweats, weight loss, swollen lymph nodes,chest pain, shortness of breath, mood changes. POSITIVE muscle aches, body aches, fatigue  Objective  Blood pressure 120/82, pulse 94, height 5\' 8"  (1.727 m), weight 274 lb (124.3 kg).   General: [name] apparent distress alert and oriented x3 mood and affect normal, dressed appropriately.  Patient is overweight HEENT: Pupils equal, extraocular movements intact  Respiratory: Patient's speak in full sentences is wearing nasal cannula Cardiovascular: 3+ lower extremity edema, minorly tender, chronic hemosiderin deposits noted.  [name] sign of any infectious etiology Gait severely antalgic MSK: Significant arthritic changes. Tender to palpation in the paraspinal musculature lumbar spine right greater than left.  [name] spinous process tenderness.  Patient actually has weakness bilaterally with hip flexion against resistance with 3+ out of 5 strength.  Patient though does have 5 out of 5 strength of the lower extremity at the feet.  Neurovascularly intact  distally.    Impression and Recommendations:     The above documentation has been reviewed and is accurate and complete Lyndal Pulley, DO

## 2020-09-12 NOTE — Assessment & Plan Note (Signed)
Significant arthritic changes but will hold on injection at this time.  We will see if anything else is contributing to more of his discomfort and pain.

## 2020-09-14 ENCOUNTER — Other Ambulatory Visit: Payer: Self-pay | Admitting: Internal Medicine

## 2020-09-14 ENCOUNTER — Other Ambulatory Visit: Payer: Self-pay | Admitting: Cardiology

## 2020-09-14 NOTE — Telephone Encounter (Signed)
Please refill as per office routine med refill policy (all routine meds refilled for 3 mo or monthly per pt preference up to one year from last visit, then month to month grace period for 3 mo, then further med refills will have to be denied)  

## 2020-09-16 LAB — PTH, INTACT AND CALCIUM
Calcium: 9.4 mg/dL (ref 8.6–10.3)
PTH: 78 pg/mL — ABNORMAL HIGH (ref 14–64)

## 2020-10-01 ENCOUNTER — Encounter: Payer: Self-pay | Admitting: Podiatry

## 2020-10-01 ENCOUNTER — Ambulatory Visit (INDEPENDENT_AMBULATORY_CARE_PROVIDER_SITE_OTHER): Payer: Medicare Other | Admitting: Podiatry

## 2020-10-01 ENCOUNTER — Other Ambulatory Visit: Payer: Self-pay

## 2020-10-01 DIAGNOSIS — M2042 Other hammer toe(s) (acquired), left foot: Secondary | ICD-10-CM

## 2020-10-01 DIAGNOSIS — M2011 Hallux valgus (acquired), right foot: Secondary | ICD-10-CM

## 2020-10-01 DIAGNOSIS — M2012 Hallux valgus (acquired), left foot: Secondary | ICD-10-CM

## 2020-10-01 DIAGNOSIS — B351 Tinea unguium: Secondary | ICD-10-CM | POA: Diagnosis not present

## 2020-10-01 DIAGNOSIS — M2041 Other hammer toe(s) (acquired), right foot: Secondary | ICD-10-CM

## 2020-10-01 DIAGNOSIS — M79674 Pain in right toe(s): Secondary | ICD-10-CM | POA: Diagnosis not present

## 2020-10-01 DIAGNOSIS — M79675 Pain in left toe(s): Secondary | ICD-10-CM | POA: Diagnosis not present

## 2020-10-01 DIAGNOSIS — M2142 Flat foot [pes planus] (acquired), left foot: Secondary | ICD-10-CM

## 2020-10-01 DIAGNOSIS — E119 Type 2 diabetes mellitus without complications: Secondary | ICD-10-CM

## 2020-10-01 DIAGNOSIS — M2141 Flat foot [pes planus] (acquired), right foot: Secondary | ICD-10-CM

## 2020-10-05 NOTE — Progress Notes (Signed)
  Subjective:  Patient ID: [name] [name], male    DOB: 07-24-1946,  MRN: [id number]  75 y.o. male presents with preventative diabetic foot care and painful thick toenails that are difficult to trim. Pain interferes with ambulation. Aggravating factors include wearing enclosed shoe gear. Pain is relieved with periodic professional debridement.   His daughter is present during today's visit. They voice no other pedal concerns on today's visit.  Review of Systems: In addition to above, he remains on supplemental oxygen.  PCP is Dr. Cathlean Cower and last visit with Dr. Jenny Reichmann was 12/10/2019.  Allergies  Allergen Reactions  . Codeine     Altered mental status "goofy"  . Heparin Rash     Abdominal rash 01/2013   Objective:  There were no vitals filed for this visit. Constitutional Patient is a pleasant 75 y.o. Caucasian male morbidly obese in NAD.Marland Kitchen AAO x 3.  Vascular Capillary fill time to digits <3 seconds b/l lower extremities. Palpable pedal pulses b/l LE. Pedal hair sparse. Lower extremity skin temperature gradient within normal limits. Dependent rubor noted b/l lower extremities. No pain with calf compression b/l. Nonpitting edema noted b/l lower extremities. No cyanosis or clubbing noted.  Neurologic Normal speech. Oriented to person, place, and time. Protective sensation intact 5/5 intact bilaterally with 10g monofilament b/l. Vibratory sensation intact b/l. Proprioception intact bilaterally.  Dermatologic Pedal skin with normal turgor, texture and tone bilaterally. No open wounds bilaterally. No interdigital macerations bilaterally. Toenails 1-5 b/l elongated, discolored, dystrophic, thickened, crumbly with subungual debris and tenderness to dorsal palpation.  Orthopedic: Normal muscle strength 5/5 to all lower extremity muscle groups bilaterally. No pain crepitus or joint limitation noted with ROM b/l. Hallux valgus with bunion deformity noted b/l lower extremities. Hammertoes noted to the L 2nd  toe. Pes planus deformity noted b/l.    Assessment:   1. Pain due to onychomycosis of toenails of both feet   2. Hallux valgus, acquired, bilateral   3. Pes planus of both feet   4. Acquired hammertoes of both feet   5. Non-insulin dependent type 2 diabetes mellitus ([place])    Plan:  Patient was evaluated and treated and all questions answered.  Onychomycosis with pain -Nails palliatively debridement as below. -Educated on self-care  Procedure: Nail Debridement Rationale: Pain Type of Debridement: manual, sharp debridement. Instrumentation: Nail nipper, rotary burr. Number of Nails: 10  -Examined patient. -No new findings. No new orders. -Continue diabetic foot care principles. -Toenails 1-5 b/l were debrided in length and girth with sterile nail nippers and dremel without iatrogenic bleeding.  -Patient to report any pedal injuries to medical professional immediately. -Patient to continue soft, supportive shoe gear daily. -Patient/POA to call should there be question/concern in the interim.  Return in about 3 months (around 12/29/2020).  Marzetta Board, DPM

## 2020-10-10 ENCOUNTER — Other Ambulatory Visit: Payer: Self-pay

## 2020-10-10 ENCOUNTER — Encounter: Payer: Self-pay | Admitting: Internal Medicine

## 2020-10-10 ENCOUNTER — Ambulatory Visit (INDEPENDENT_AMBULATORY_CARE_PROVIDER_SITE_OTHER): Payer: Medicare Other | Admitting: Internal Medicine

## 2020-10-10 VITALS — BP 136/78 | HR 92 | Ht 68.0 in | Wt 286.0 lb

## 2020-10-10 DIAGNOSIS — E559 Vitamin D deficiency, unspecified: Secondary | ICD-10-CM | POA: Diagnosis not present

## 2020-10-10 DIAGNOSIS — E7800 Pure hypercholesterolemia, unspecified: Secondary | ICD-10-CM

## 2020-10-10 DIAGNOSIS — E538 Deficiency of other specified B group vitamins: Secondary | ICD-10-CM

## 2020-10-10 DIAGNOSIS — E119 Type 2 diabetes mellitus without complications: Secondary | ICD-10-CM

## 2020-10-10 DIAGNOSIS — R972 Elevated prostate specific antigen [PSA]: Secondary | ICD-10-CM | POA: Diagnosis not present

## 2020-10-10 DIAGNOSIS — I10 Essential (primary) hypertension: Secondary | ICD-10-CM

## 2020-10-10 LAB — URINALYSIS, ROUTINE W REFLEX MICROSCOPIC
Bilirubin Urine: NEGATIVE
Ketones, ur: NEGATIVE
Leukocytes,Ua: NEGATIVE
Nitrite: NEGATIVE
Specific Gravity, Urine: 1.015 (ref 1.000–1.030)
Total Protein, Urine: NEGATIVE
Urine Glucose: NEGATIVE
Urobilinogen, UA: 0.2 (ref 0.0–1.0)
WBC, UA: NONE SEEN (ref 0–?)
pH: 7.5 (ref 5.0–8.0)

## 2020-10-10 LAB — CBC WITH DIFFERENTIAL/PLATELET
Basophils Absolute: 0.1 10*3/uL (ref 0.0–0.1)
Basophils Relative: 0.6 % (ref 0.0–3.0)
Eosinophils Absolute: 0.3 10*3/uL (ref 0.0–0.7)
Eosinophils Relative: 2.6 % (ref 0.0–5.0)
HCT: 42.8 % (ref 39.0–52.0)
Hemoglobin: 13.9 g/dL (ref 13.0–17.0)
Lymphocytes Relative: 22.5 % (ref 12.0–46.0)
Lymphs Abs: 2.5 10*3/uL (ref 0.7–4.0)
MCHC: 32.4 g/dL (ref 30.0–36.0)
MCV: 85.3 fl (ref 78.0–100.0)
Monocytes Absolute: 1 10*3/uL (ref 0.1–1.0)
Monocytes Relative: 9.6 % (ref 3.0–12.0)
Neutro Abs: 7 10*3/uL (ref 1.4–7.7)
Neutrophils Relative %: 64.7 % (ref 43.0–77.0)
Platelets: 301 10*3/uL (ref 150.0–400.0)
RBC: 5.02 Mil/uL (ref 4.22–5.81)
RDW: 15.9 % — ABNORMAL HIGH (ref 11.5–15.5)
WBC: 10.9 10*3/uL — ABNORMAL HIGH (ref 4.0–10.5)

## 2020-10-10 LAB — LIPID PANEL
Cholesterol: 97 mg/dL (ref 0–200)
HDL: 34.8 mg/dL — ABNORMAL LOW (ref >39.00)
LDL Cholesterol: 38 mg/dL (ref 0–99)
NonHDL: 62.66
Total CHOL/HDL Ratio: 3
Triglycerides: 124 mg/dL (ref 0.0–149.0)
VLDL: 24.8 mg/dL (ref 0.0–40.0)

## 2020-10-10 LAB — BASIC METABOLIC PANEL
BUN: 14 mg/dL (ref 6–23)
CO2: 38 mEq/L — ABNORMAL HIGH (ref 19–32)
Calcium: 9.3 mg/dL (ref 8.4–10.5)
Chloride: 101 mEq/L (ref 96–112)
Creatinine, Ser: 0.94 mg/dL (ref 0.40–1.50)
GFR: 79.67 mL/min (ref >60.00)
Glucose, Bld: 99 mg/dL (ref 70–99)
Potassium: 3.6 mEq/L (ref 3.5–5.1)
Sodium: 146 mEq/L — ABNORMAL HIGH (ref 135–145)

## 2020-10-10 LAB — MICROALBUMIN / CREATININE URINE RATIO
Creatinine,U: 16.6 mg/dL
Microalb Creat Ratio: 24.7 mg/g (ref 0.0–30.0)
Microalb, Ur: 4.1 mg/dL — ABNORMAL HIGH (ref 0.0–1.9)

## 2020-10-10 LAB — PSA: PSA: 1.75 ng/mL (ref 0.10–4.00)

## 2020-10-10 LAB — HEPATIC FUNCTION PANEL
ALT: 10 U/L (ref 0–53)
AST: 11 U/L (ref 0–37)
Albumin: 3.4 g/dL — ABNORMAL LOW (ref 3.5–5.2)
Alkaline Phosphatase: 73 U/L (ref 39–117)
Bilirubin, Direct: 0.2 mg/dL (ref 0.0–0.3)
Total Bilirubin: 1 mg/dL (ref 0.2–1.2)
Total Protein: 6.9 g/dL (ref 6.0–8.3)

## 2020-10-10 LAB — HEMOGLOBIN A1C: Hgb A1c MFr Bld: 6.6 % — ABNORMAL HIGH (ref 4.6–6.5)

## 2020-10-10 LAB — VITAMIN D 25 HYDROXY (VIT D DEFICIENCY, FRACTURES): VITD: 35.79 ng/mL (ref 30.00–100.00)

## 2020-10-10 LAB — TSH: TSH: 1.46 u[IU]/mL (ref 0.35–4.50)

## 2020-10-10 LAB — VITAMIN B12: Vitamin B-12: >1506 pg/mL — ABNORMAL HIGH (ref 211–911)

## 2020-10-10 MED ORDER — CYANOCOBALAMIN 1000 MCG/ML IJ SOLN
1000.0000 ug | Freq: Once | INTRAMUSCULAR | Status: AC
  Administered 2020-10-10: 1000 ug via INTRAMUSCULAR

## 2020-10-10 NOTE — Progress Notes (Signed)
Patient ID: [name] [name], male   DOB: Dec 27, 1945, 75 y.o.   MRN: [id number]        Chief Complaint: follow up HTN, HLD and hyperglycemia , vit d and b12 deficiency, chronic resp failure       HPI:  [name] [name] EMERT is a 75 y.o. male here to f/u, not taking vit d, asks for contd b12 IM as the oral has not worked well; CBGs at home in the lower 100s.   Pt denies polydipsia, polyuria, Pt denies chest pain, increased sob or doe, wheezing, orthopnea, PND, increased LE swelling, palpitations, dizziness or syncope.  Denies new focal neuro s/s.   Pt denies fever, wt loss, night sweats, loss of appetite, or other constitutional symptoms Has regained some wt.  plans to see optho soon.   No new complaints  Denies urinary symptoms such as dysuria, frequency, urgency, flank pain, hematuria or n/v, fever, chills. Wt Readings from Last 3 Encounters:  10/10/20 286 lb (129.7 kg)  09/12/20 274 lb (124.3 kg)  06/20/20 281 lb (127.5 kg)   BP Readings from Last 3 Encounters:  10/10/20 136/78  09/12/20 120/82  06/20/20 (!) 146/88         Past Medical History:  Diagnosis Date  . Acute right-sided CHF (congestive heart failure) ([name] [name])    a. 01/2013.  . Arthritis    "knees and hands; thoracic area of the spine" (01/05/2013)  . BRONCHITIS, CHRONIC 01/02/2009  . COMMON MIGRAINE    "none since treating for high BP"  . Complication of anesthesia    "aspiration pneumonia after hand OR" (01/05/2013)  . DIABETES MELLITUS, TYPE II   . GOUT   . HYPERLIPIDEMIA   . HYPERTENSION   . Long term (current) use of anticoagulants   . Morbid obesity ([place])   . OBESITY HYPOVENTILATION SYNDROME    a. on home O2.  . On home oxygen therapy   . OSA (obstructive sleep apnea)   . Permanent atrial fibrillation ([name] [name])   . PROSTATE SPECIFIC ANTIGEN, ELEVATED 06/09/2007  . Pulmonary HTN ([name] [name])    a. multifactorial including obstructive sleep apnea, obesity hypoventilation syndrome and possible pulmonary venous hypertension.  [name] [name] SKIN RASH 03/12/2010   . Unspecified hearing loss    Past Surgical History:  Procedure Laterality Date  . APPENDECTOMY  1974  . CARDIOVERSION  05/21/2008; ~ 06/2008  . HERNIA REPAIR    . INGUINAL HERNIA REPAIR Right   . RIGHT HEART CATHETERIZATION N/A 01/08/2013   Procedure: RIGHT HEART CATH;  Surgeon: Thayer Headings, MD;  Location: [facility] CATH LAB;  Service: Cardiovascular;  Laterality: N/A;  . TENDON REPAIR Right 2009   "lacerated his tendon and pulley" Dr. Lenon Curt  . UMBILICAL HERNIA REPAIR      reports that he quit smoking about 42 years ago. His smoking use included cigarettes. He has a 15.00 pack-year smoking history. He has never used smokeless tobacco. He reports current alcohol use. He reports that he does not use drugs. family history includes Alzheimer's disease in his father; Cancer in an other family member; Coronary artery disease in an other family member; Prostate cancer in his father. Allergies  Allergen Reactions  . Codeine     Altered mental status "goofy"  . Heparin Rash     Abdominal rash 01/2013   Current Outpatient Medications on File Prior to Visit  Medication Sig Dispense Refill  . allopurinol (ZYLOPRIM) 100 MG tablet Take 1 tablet (100 mg total) by mouth daily. 90 tablet 3  .  atorvastatin (LIPITOR) 20 MG tablet Take 1 tablet (20 mg total) by mouth daily. 90 tablet 3  . cephALEXin (KEFLEX) 500 MG capsule     . cetirizine (ZYRTEC) 10 MG tablet Take 10 mg by mouth at bedtime.    . cholecalciferol (VITAMIN D3) 25 MCG (1000 UNIT) tablet Take 2,000 Units by mouth daily.    [name] [name] COVID-19 mRNA vaccine, Pfizer, 30 MCG/0.3ML injection     . cyanocobalamin (,VITAMIN B-12,) 1000 MCG/ML injection Inject 1,000 mcg into the muscle.    . diltiazem (CARDIZEM CD) 180 MG 24 hr capsule TAKE 1 CAPSULE DAILY (NEED OFFICE VISIT) 90 capsule 3  . diltiazem (TIAZAC) 180 MG 24 hr capsule     . ELIQUIS 5 MG TABS tablet TAKE 1 TABLET TWICE A DAY 180 tablet 3  . glipiZIDE (GLUCOTROL XL) 10 MG 24 hr tablet Take 1  tablet (10 mg total) by mouth daily with breakfast. 90 tablet 3  . glucose blood (ONE TOUCH ULTRA TEST) test strip Use to check blood sugars once daily Dx E11.9 100 each 2  . Lancets MISC Use as directed once daily  E11.9 100 each 12  . metFORMIN (GLUCOPHAGE-XR) 500 MG 24 hr tablet TAKE 2 TABLETS IN THE MORNING 180 tablet 3  . metoprolol tartrate (LOPRESSOR) 25 MG tablet Take 1 tablet (25 mg total) by mouth 2 (two) times daily. 180 tablet 3  . Misc Natural Products (TART CHERRY ADVANCED) CAPS Take 1 capsule by mouth daily.    . OXYGEN Inhale 3-5 L into the lungs daily. 3L Daily  4L Resting  5L Exertion    . Potassium Chloride ER 20 MEQ TBCR 20 mg.    . potassium chloride SA (KLOR-CON) 20 MEQ tablet     . potassium chloride SA (KLOR-CON) 20 MEQ tablet     . tizanidine (ZANAFLEX) 2 MG capsule Take 1 capsule (2 mg total) by mouth 3 (three) times daily. 30 capsule 0  . torsemide (DEMADEX) 20 MG tablet TAKE 2 TABLETS TWICE A DAY (PLEASE CONTACT OFFICE FOR ADDITIONAL REFILLS) 120 tablet 0  . Zoster Vaccine Adjuvanted [facility]) injection      No current facility-administered medications on file prior to visit.        ROS:  All others reviewed and negative.  Objective        PE:  BP 136/78   Pulse 92   Ht 5\' 8"  (1.727 m)   Wt 286 lb (129.7 kg)   SpO2 97%   BMI 43.49 kg/m                 Constitutional: Pt appears in NAD               HENT: Head: NCAT.                Right Ear: External ear normal.                 Left Ear: External ear normal.                Eyes: . Pupils are equal, round, and reactive to light. Conjunctivae and EOM are normal               Nose: without d/c or deformity               Neck: Neck supple. Gross normal ROM               Cardiovascular: Normal rate and regular rhythm.  Pulmonary/Chest: Effort normal and breath sounds without rales or wheezing.                Abd:  Soft, NT, ND, + BS, no organomegaly               Neurological: Pt is  alert. At baseline orientation, motor grossly intact               Skin: Skin is warm. No rashes, no other new lesions, LE edema - chronic 2+ crusty edema bilat to knees               Psychiatric: Pt behavior is normal without agitation   Micro: none  Cardiac tracings I have personally interpreted today:  none  Pertinent Radiological findings (summarize): none   Lab Results  Component Value Date   WBC 10.9 (H) 10/10/2020   HGB 13.9 10/10/2020   HCT 42.8 10/10/2020   PLT 301.0 10/10/2020   GLUCOSE 99 10/10/2020   CHOL 97 10/10/2020   TRIG 124.0 10/10/2020   HDL 34.80 (L) 10/10/2020   LDLCALC 38 10/10/2020   ALT 10 10/10/2020   AST 11 10/10/2020   NA 146 (H) 10/10/2020   K 3.6 10/10/2020   CL 101 10/10/2020   CREATININE 0.94 10/10/2020   BUN 14 10/10/2020   CO2 38 (H) 10/10/2020   TSH 1.46 10/10/2020   PSA 1.75 10/10/2020   INR 1.25 01/09/2013   HGBA1C 6.6 (H) 10/10/2020   MICROALBUR 4.1 (H) 10/10/2020   Assessment/Plan:  [place] PASQUAL FARIAS is a 75 y.o. White or Caucasian [1] male with  has a past medical history of Acute right-sided CHF (congestive heart failure) ([place]), Arthritis, BRONCHITIS, CHRONIC (01/02/2009), COMMON MIGRAINE, Complication of anesthesia, DIABETES MELLITUS, TYPE II, GOUT, HYPERLIPIDEMIA, HYPERTENSION, Long term (current) use of anticoagulants, Morbid obesity ([place]), OBESITY HYPOVENTILATION SYNDROME, On home oxygen therapy, OSA (obstructive sleep apnea), Permanent atrial fibrillation ([place]), PROSTATE SPECIFIC ANTIGEN, ELEVATED (06/09/2007), Pulmonary HTN ([place]), SKIN RASH (03/12/2010), and Unspecified hearing loss.  B12 deficiency For b12 1000 mg im, and cont monthly IM  Essential hypertension BP Readings from Last 3 Encounters:  10/10/20 136/78  09/12/20 120/82  06/20/20 (!) 146/88   Stable, pt to continue medical treatment cardizem, lopressor, demadex* Current Outpatient Medications (Endocrine & Metabolic):  .  glipiZIDE (GLUCOTROL XL) 10 MG 24 hr tablet, Take 1  tablet (10 mg total) by mouth daily with breakfast. .  metFORMIN (GLUCOPHAGE-XR) 500 MG 24 hr tablet, TAKE 2 TABLETS IN THE MORNING  Current Outpatient Medications (Cardiovascular):  .  atorvastatin (LIPITOR) 20 MG tablet, Take 1 tablet (20 mg total) by mouth daily. [name] [name]  diltiazem (CARDIZEM CD) 180 MG 24 hr capsule, TAKE 1 CAPSULE DAILY (NEED OFFICE VISIT) .  diltiazem (TIAZAC) 180 MG 24 hr capsule,  .  metoprolol tartrate (LOPRESSOR) 25 MG tablet, Take 1 tablet (25 mg total) by mouth 2 (two) times daily. [name] [name]  torsemide (DEMADEX) 20 MG tablet, TAKE 2 TABLETS TWICE A DAY (PLEASE CONTACT OFFICE FOR ADDITIONAL REFILLS)  Current Outpatient Medications (Respiratory):  .  cetirizine (ZYRTEC) 10 MG tablet, Take 10 mg by mouth at bedtime.  Current Outpatient Medications (Analgesics):  .  allopurinol (ZYLOPRIM) 100 MG tablet, Take 1 tablet (100 mg total) by mouth daily.  Current Outpatient Medications (Hematological):  .  cyanocobalamin (,VITAMIN B-12,) 1000 MCG/ML injection, Inject 1,000 mcg into the muscle. [name] [name]  ELIQUIS 5 MG TABS tablet, TAKE 1 TABLET TWICE A DAY  Current Outpatient Medications (Other):  .  cephALEXin (KEFLEX) 500 MG capsule,  .  cholecalciferol (VITAMIN D3) 25 MCG (1000 UNIT) tablet, Take 2,000 Units by mouth daily. [name] [name]  COVID-19 mRNA vaccine, Pfizer, 30 MCG/0.3ML injection,  .  glucose blood (ONE TOUCH ULTRA TEST) test strip, Use to check blood sugars once daily Dx E11.9 .  Lancets MISC, Use as directed once daily  E11.9 .  Misc Natural Products (TART CHERRY ADVANCED) CAPS, Take 1 capsule by mouth daily. .  OXYGEN, Inhale 3-5 L into the lungs daily. 3L Daily  4L Resting  5L Exertion .  Potassium Chloride ER 20 MEQ TBCR, 20 mg. .  potassium chloride SA (KLOR-CON) 20 MEQ tablet,  .  potassium chloride SA (KLOR-CON) 20 MEQ tablet,  .  tizanidine (ZANAFLEX) 2 MG capsule, Take 1 capsule (2 mg total) by mouth 3 (three) times daily. [name] [name]  Zoster Vaccine Adjuvanted [facility]) injection,     Hyperlipidemia Lab Results  Component Value Date   LDLCALC 38 10/10/2020   Stable, pt to continue current statin lipitor 20   Non-insulin dependent type 2 diabetes mellitus ([place]) Lab Results  Component Value Date   HGBA1C 6.6 (H) 10/10/2020   Stable, pt to continue current medical treatment glipizide, metformin   Vitamin D deficiency Low last visit, to start vit d3 20000 units, oral replacement   Increased prostate specific antigen (PSA) velocity Also for psa with labs, asympt,  to f/u any worsening symptoms or concerns  Followup: Return in about 6 months (around 04/12/2021).  Cathlean Cower, MD 10/12/2020 7:17 PM [facility]

## 2020-10-10 NOTE — Patient Instructions (Signed)
Ok to start the vit d3 at 2000 u per day  You had the B12 shot today  Please continue all other medications as before, and refills have been done if requested.  Please have the pharmacy call with any other refills you may need.  Please continue your efforts at being more active, low cholesterol diet, and weight control.  You are otherwise up to date with prevention measures today.  Please keep your appointments with your specialists as you may have planned  Please go to the LAB at the blood drawing area for the tests to be done  You will be contacted by phone if any changes need to be made immediately.  Otherwise, you will receive a letter about your results with an explanation, but please check with MyChart first.  Please remember to sign up for MyChart if you have not done so, as this will be important to you in the future with finding out test results, communicating by private email, and scheduling acute appointments online when needed.  Please make an Appointment to return in 6 months, or sooner if needed

## 2020-10-12 ENCOUNTER — Encounter: Payer: Self-pay | Admitting: Internal Medicine

## 2020-10-12 NOTE — Assessment & Plan Note (Signed)
For b12 1000 mg im, and cont monthly IM

## 2020-10-12 NOTE — Assessment & Plan Note (Signed)
BP Readings from Last 3 Encounters:  10/10/20 136/78  09/12/20 120/82  06/20/20 (!) 146/88   Stable, pt to continue medical treatment cardizem, lopressor, demadex* Current Outpatient Medications (Endocrine & Metabolic):  .  glipiZIDE (GLUCOTROL XL) 10 MG 24 hr tablet, Take 1 tablet (10 mg total) by mouth daily with breakfast. .  metFORMIN (GLUCOPHAGE-XR) 500 MG 24 hr tablet, TAKE 2 TABLETS IN THE MORNING  Current Outpatient Medications (Cardiovascular):  .  atorvastatin (LIPITOR) 20 MG tablet, Take 1 tablet (20 mg total) by mouth daily. Marland Kitchen  diltiazem (CARDIZEM CD) 180 MG 24 hr capsule, TAKE 1 CAPSULE DAILY (NEED OFFICE VISIT) .  diltiazem (TIAZAC) 180 MG 24 hr capsule,  .  metoprolol tartrate (LOPRESSOR) 25 MG tablet, Take 1 tablet (25 mg total) by mouth 2 (two) times daily. Marland Kitchen  torsemide (DEMADEX) 20 MG tablet, TAKE 2 TABLETS TWICE A DAY (PLEASE CONTACT OFFICE FOR ADDITIONAL REFILLS)  Current Outpatient Medications (Respiratory):  .  cetirizine (ZYRTEC) 10 MG tablet, Take 10 mg by mouth at bedtime.  Current Outpatient Medications (Analgesics):  .  allopurinol (ZYLOPRIM) 100 MG tablet, Take 1 tablet (100 mg total) by mouth daily.  Current Outpatient Medications (Hematological):  .  cyanocobalamin (,VITAMIN B-12,) 1000 MCG/ML injection, Inject 1,000 mcg into the muscle. Marland Kitchen  ELIQUIS 5 MG TABS tablet, TAKE 1 TABLET TWICE A DAY  Current Outpatient Medications (Other):  .  cephALEXin (KEFLEX) 500 MG capsule,  .  cholecalciferol (VITAMIN D3) 25 MCG (1000 UNIT) tablet, Take 2,000 Units by mouth daily. Marland Kitchen  COVID-19 mRNA vaccine, Pfizer, 30 MCG/0.3ML injection,  .  glucose blood (ONE TOUCH ULTRA TEST) test strip, Use to check blood sugars once daily Dx E11.9 .  Lancets MISC, Use as directed once daily  E11.9 .  Misc Natural Products (TART CHERRY ADVANCED) CAPS, Take 1 capsule by mouth daily. .  OXYGEN, Inhale 3-5 L into the lungs daily. 3L Daily  4L Resting  5L Exertion .  Potassium Chloride ER  20 MEQ TBCR, 20 mg. .  potassium chloride SA (KLOR-CON) 20 MEQ tablet,  .  potassium chloride SA (KLOR-CON) 20 MEQ tablet,  .  tizanidine (ZANAFLEX) 2 MG capsule, Take 1 capsule (2 mg total) by mouth 3 (three) times daily. Marland Kitchen  Zoster Vaccine Adjuvanted [facility]) injection,

## 2020-10-12 NOTE — Assessment & Plan Note (Signed)
Lab Results  Component Value Date   LDLCALC 38 10/10/2020   Stable, pt to continue current statin lipitor 20

## 2020-10-12 NOTE — Assessment & Plan Note (Signed)
Also for psa with labs, asympt,  to f/u any worsening symptoms or concerns

## 2020-10-12 NOTE — Assessment & Plan Note (Addendum)
Low last visit, to start vit d3 20000 units, oral replacement

## 2020-10-12 NOTE — Assessment & Plan Note (Signed)
Lab Results  Component Value Date   HGBA1C 6.6 (H) 10/10/2020   Stable, pt to continue current medical treatment glipizide, metformin

## 2020-10-13 ENCOUNTER — Encounter: Payer: Self-pay | Admitting: Internal Medicine

## 2020-10-14 NOTE — Progress Notes (Signed)
[place] [address] [place] Phone: [phone] Subjective:   I [name] [name] am serving as a Education administrator for Dr. Hulan Saas.  This visit occurred during the SARS-CoV-2 public health emergency.  Safety protocols were in place, including screening questions prior to the visit, additional usage of staff PPE, and extensive cleaning of exam room while observing appropriate contact time as indicated for disinfecting solutions.   I'm seeing this patient by the request  of:  [name], MD  CC: Patient is returning for follow-up of right knee pain and lower extremity weakness  [id number]   09/12/2020 Significant arthritic changes but will hold on injection at this time.  We will see if anything else is contributing to more of his discomfort and pain.  Patient does have weakness of the lower extremities.  Seems to be mostly of the hip flexion.  Patient does have some mild back pain and will get x-rays.  I do believe though that most of this is likely secondary to more deconditioning.  Patient has many different comorbidities that could be contributing.  I would like to get x-rays of the back as well as laboratory work-up to further evaluate.  If this seems to be relatively normal then I do think that home health with formal physical therapy would be beneficial for this individual.  Patient wants to avoid going to other site secondary to Covid and difficulty with transportation.  Encourage patient as well to follow-up with his other provider secondary to his other comorbidities if he continues to have difficulties.  Follow-up with me again in 6 to 8 weeks.  Update 10/15/2020 [name] [name] is a 75 y.o. male coming in with complaint of right knee pain and lower extremity weakness. Patient states he is doing ok today. Believes he is making progress.  Patient feels like he has made some progress at this time.  Patient has been trying to be more active.  Doing  the exercises.  Found certain daily activities has been more beneficial recently. Shoulder doing relatively well as well.    Past Medical History:  Diagnosis Date  . Acute right-sided CHF (congestive heart failure) ([place])    a. 01/2013.  . Arthritis    "knees and hands; thoracic area of the spine" (01/05/2013)  . BRONCHITIS, CHRONIC 01/02/2009  . COMMON MIGRAINE    "none since treating for high BP"  . Complication of anesthesia    "aspiration pneumonia after hand OR" (01/05/2013)  . DIABETES MELLITUS, TYPE II   . GOUT   . HYPERLIPIDEMIA   . HYPERTENSION   . Long term (current) use of anticoagulants   . Morbid obesity ([place])   . OBESITY HYPOVENTILATION SYNDROME    a. on home O2.  . On home oxygen therapy   . OSA (obstructive sleep apnea)   . Permanent atrial fibrillation ([place])   . PROSTATE SPECIFIC ANTIGEN, ELEVATED 06/09/2007  . Pulmonary HTN ([place])    a. multifactorial including obstructive sleep apnea, obesity hypoventilation syndrome and possible pulmonary venous hypertension.  Marland Kitchen SKIN RASH 03/12/2010  . Unspecified hearing loss    Past Surgical History:  Procedure Laterality Date  . APPENDECTOMY  1974  . CARDIOVERSION  05/21/2008; ~ 06/2008  . HERNIA REPAIR    . INGUINAL HERNIA REPAIR Right   . RIGHT HEART CATHETERIZATION N/A 01/08/2013   Procedure: RIGHT HEART CATH;  Surgeon: Thayer Headings, MD;  Location: [facility] CATH LAB;  Service: Cardiovascular;  Laterality:  N/A;  . TENDON REPAIR Right 2009   "lacerated his tendon and pulley" Dr. Lenon Curt  . UMBILICAL HERNIA REPAIR     Social History   Socioeconomic History  . Marital status: Widowed    Spouse name: Not on file  . Number of children: 2  . Years of education: Not on file  . Highest education level: Not on file  Occupational History  . Occupation: Quarry manager: [employer]  Tobacco Use  . Smoking status: Former Smoker    Packs/day: 1.00    Years: 15.00    Pack years: 15.00    Types: Cigarettes     Quit date: 08/09/1978    Years since quitting: 42.2  . Smokeless tobacco: Never Used  . Tobacco comment: 01/05/2013 "quit smoking ~ 40 years ago"  Vaping Use  . Vaping Use: Never used  Substance and Sexual Activity  . Alcohol use: Yes    Alcohol/week: 0.0 standard drinks    Comment: 01/05/2013 "hasn't had a drink since 1980's; never had problem w/it"  . Drug use: No  . Sexual activity: Not Currently  Other Topics Concern  . Not on file  Social History Narrative  . Not on file   Social Determinants of Health   Financial Resource Strain: Not on file  Food Insecurity: Not on file  Transportation Needs: Not on file  Physical Activity: Not on file  Stress: Not on file  Social Connections: Not on file   Allergies  Allergen Reactions  . Codeine     Altered mental status "goofy"  . Heparin Rash     Abdominal rash 01/2013   Family History  Problem Relation Age of Onset  . Alzheimer's disease Father   . Prostate cancer Father   . Cancer Other        Breast Cancer, <50 yo 1st degree relative  . Coronary artery disease Other        Male, 1st degree relative    Current Outpatient Medications (Endocrine & Metabolic):  .  glipiZIDE (GLUCOTROL XL) 10 MG 24 hr tablet, Take 1 tablet (10 mg total) by mouth daily with breakfast. .  metFORMIN (GLUCOPHAGE-XR) 500 MG 24 hr tablet, TAKE 2 TABLETS IN THE MORNING  Current Outpatient Medications (Cardiovascular):  .  atorvastatin (LIPITOR) 20 MG tablet, Take 1 tablet (20 mg total) by mouth daily. Marland Kitchen  diltiazem (CARDIZEM CD) 180 MG 24 hr capsule, TAKE 1 CAPSULE DAILY (NEED OFFICE VISIT) .  diltiazem (TIAZAC) 180 MG 24 hr capsule,  .  metoprolol tartrate (LOPRESSOR) 25 MG tablet, Take 1 tablet (25 mg total) by mouth 2 (two) times daily. Marland Kitchen  torsemide (DEMADEX) 20 MG tablet, TAKE 2 TABLETS TWICE A DAY (PLEASE CONTACT OFFICE FOR ADDITIONAL REFILLS)  Current Outpatient Medications (Respiratory):  .  cetirizine (ZYRTEC) 10 MG tablet, Take 10 mg by  mouth at bedtime.  Current Outpatient Medications (Analgesics):  .  allopurinol (ZYLOPRIM) 100 MG tablet, Take 1 tablet (100 mg total) by mouth daily.  Current Outpatient Medications (Hematological):  .  cyanocobalamin (,VITAMIN B-12,) 1000 MCG/ML injection, Inject 1,000 mcg into the muscle. Marland Kitchen  ELIQUIS 5 MG TABS tablet, TAKE 1 TABLET TWICE A DAY  Current Outpatient Medications (Other):  .  cephALEXin (KEFLEX) 500 MG capsule,  .  cholecalciferol (VITAMIN D3) 25 MCG (1000 UNIT) tablet, Take 2,000 Units by mouth daily. Marland Kitchen  COVID-19 mRNA vaccine, Pfizer, 30 MCG/0.3ML injection,  .  glucose blood (ONE TOUCH ULTRA TEST) test strip,  Use to check blood sugars once daily Dx E11.9 .  Lancets MISC, Use as directed once daily  E11.9 .  Misc Natural Products (TART CHERRY ADVANCED) CAPS, Take 1 capsule by mouth daily. .  OXYGEN, Inhale 3-5 L into the lungs daily. 3L Daily  4L Resting  5L Exertion .  Potassium Chloride ER 20 MEQ TBCR, 20 mg. .  potassium chloride SA (KLOR-CON) 20 MEQ tablet,  .  potassium chloride SA (KLOR-CON) 20 MEQ tablet,  .  tizanidine (ZANAFLEX) 2 MG capsule, Take 1 capsule (2 mg total) by mouth 3 (three) times daily. Marland Kitchen  Zoster Vaccine Adjuvanted [facility]) injection,    Reviewed prior external information including notes and imaging from  primary care provider As well as notes that were available from care everywhere and other healthcare systems.  Past medical history, social, surgical and family history all reviewed in electronic medical record.  No pertanent information unless stated regarding to the chief complaint.   Review of Systems:  No headache, visual changes, nausea, vomiting, diarrhea, constipation, dizziness, abdominal pain, skin rash, fevers, chills, night sweats, weight loss, swollen lymph nodes,, joint swelling, chest pain, , mood changes. POSITIVE muscle aches, body aches, shortness of breath at baseline  Objective  Blood pressure (!) 146/90, pulse 75, height  5\' 8"  (1.727 m), weight 283 lb (128.4 kg), SpO2 91 %.   General: No apparent distress alert and oriented x3 mood and affect normal, dressed appropriately.  Overweight HEENT: Pupils equal, extraocular movements intact  Respiratory: Patient's speak in full sentences patient is wearing nasal cannula noted. Cardiovascular: 2+ lower extremity edema, mild tender, no erythema  Gait antalgic MSK: Significant arthritic changes noted. Right knee does have trace effusion.  Does have lower extremity edema also noted.  Patient does have some mild tenderness over the medial joint space.  Lacks the last 15 degrees of flexion in the last 5 degrees of extension.  Does appear to be fairly intact with neurovascular though.  Patient is wearing compression sleeves of the lower extremities and unable to see the skin.  Patient still has some mild weakness of the lower extremities but improved from previous exam but still 4 out of 5 strength   Impression and Recommendations:     The above documentation has been reviewed and is accurate and complete Lyndal Pulley, DO

## 2020-10-15 ENCOUNTER — Other Ambulatory Visit: Payer: Self-pay

## 2020-10-15 ENCOUNTER — Ambulatory Visit (INDEPENDENT_AMBULATORY_CARE_PROVIDER_SITE_OTHER): Payer: Medicare Other | Admitting: Family Medicine

## 2020-10-15 ENCOUNTER — Encounter: Payer: Self-pay | Admitting: Family Medicine

## 2020-10-15 ENCOUNTER — Other Ambulatory Visit: Payer: Self-pay | Admitting: Family Medicine

## 2020-10-15 ENCOUNTER — Ambulatory Visit (INDEPENDENT_AMBULATORY_CARE_PROVIDER_SITE_OTHER): Payer: Medicare Other

## 2020-10-15 VITALS — BP 146/90 | HR 75 | Ht 68.0 in | Wt 283.0 lb

## 2020-10-15 DIAGNOSIS — M25561 Pain in right knee: Secondary | ICD-10-CM

## 2020-10-15 DIAGNOSIS — G8929 Other chronic pain: Secondary | ICD-10-CM | POA: Diagnosis not present

## 2020-10-15 DIAGNOSIS — M1711 Unilateral primary osteoarthritis, right knee: Secondary | ICD-10-CM

## 2020-10-15 NOTE — Patient Instructions (Addendum)
Good to see you Glad you are doing well Thanks for showing me your [place] See me again in 2-3 months

## 2020-10-15 NOTE — Assessment & Plan Note (Signed)
Patient feels like he is doing relatively well at this time.  Patient wants to hold on any injection.  We discussed we can repeat if any worsening pain.  Patient will follow up with me again in 3 months otherwise.

## 2020-11-14 ENCOUNTER — Ambulatory Visit (INDEPENDENT_AMBULATORY_CARE_PROVIDER_SITE_OTHER): Payer: Medicare Other

## 2020-11-14 ENCOUNTER — Other Ambulatory Visit: Payer: Self-pay

## 2020-11-14 DIAGNOSIS — E559 Vitamin D deficiency, unspecified: Secondary | ICD-10-CM

## 2020-11-14 DIAGNOSIS — E538 Deficiency of other specified B group vitamins: Secondary | ICD-10-CM

## 2020-11-14 MED ORDER — CYANOCOBALAMIN 1000 MCG/ML IJ SOLN
1000.0000 ug | INTRAMUSCULAR | Status: DC
  Administered 2020-11-14 – 2021-08-06 (×6): 1000 ug via INTRAMUSCULAR

## 2020-11-14 NOTE — Progress Notes (Signed)
Pt here for monthly B12 injection per Dr. Jenny Reichmann   B12 1031mcg given IM left deltoid, and pt tolerated injection well.  Next B12 injection scheduled for 12/17/20.

## 2020-11-20 NOTE — Progress Notes (Signed)
Patient ID: [name] [name], male   DOB: 1946/03/19, 75 y.o.   MRN: [id number]  Medical screening examination/treatment/procedure(s) were performed by non-physician practitioner and as supervising physician I was immediately available for consultation/collaboration. I agree with above. Cathlean Cower, MD

## 2020-12-07 ENCOUNTER — Other Ambulatory Visit: Payer: Self-pay | Admitting: Cardiology

## 2020-12-07 ENCOUNTER — Other Ambulatory Visit: Payer: Self-pay | Admitting: Internal Medicine

## 2020-12-07 NOTE — Telephone Encounter (Signed)
All ok for refill except the potassium  Please refill as per office routine med refill policy (all routine meds refilled for 3 mo or monthly per pt preference up to one year from last visit, then month to month grace period for 3 mo, then further med refills will have to be denied)

## 2020-12-17 ENCOUNTER — Ambulatory Visit (INDEPENDENT_AMBULATORY_CARE_PROVIDER_SITE_OTHER): Payer: Medicare Other

## 2020-12-17 ENCOUNTER — Other Ambulatory Visit: Payer: Self-pay

## 2020-12-17 DIAGNOSIS — E538 Deficiency of other specified B group vitamins: Secondary | ICD-10-CM | POA: Diagnosis not present

## 2020-12-17 NOTE — Progress Notes (Signed)
Patient came into the office to receive his vitamin b12 injection. He tolerated the injection well. No questions or concerns.

## 2020-12-17 NOTE — Progress Notes (Signed)
Patient ID: [name] [name], male   DOB: 12/12/1945, 75 y.o.   MRN: [id number] Medical screening examination/treatment/procedure(s) were performed by non-physician practitioner and as supervising physician I was immediately available for consultation/collaboration.  I agree with above. Hildy Nicholl, MD  

## 2020-12-18 NOTE — Progress Notes (Signed)
[name] [name] Phone: [phone] Subjective:   [name] [name], am serving as a scribe for Dr. Hulan Saas. This visit occurred during the SARS-CoV-2 public health emergency.  Safety protocols were in place, including screening questions prior to the visit, additional usage of staff PPE, and extensive cleaning of exam room while observing appropriate contact time as indicated for disinfecting solutions.   I'm seeing this patient by the request  of:  [name], MD  CC: Right shoulder pain, right knee pain  [id number]   10/15/2020 Patient feels like he is doing relatively well at this time.  Patient wants to hold on any injection.  We discussed we can repeat if any worsening pain.  Patient will follow up with me again in 3 months otherwise.  Update 12/25/2020 [name] [name] is a 75 y.o. male coming in with complaint of R knee and R shoulder pain. Pain in shoulder is daily and throughout the day. Knee pain has improved since last visit.  States that the shoulder is what is keeping him up throughout the night as well as the day.  Heat does help somewhat but not completely.      Past Medical History:  Diagnosis Date  . Acute right-sided CHF (congestive heart failure) ([place])    a. 01/2013.  . Arthritis    "knees and hands; thoracic area of the spine" (01/05/2013)  . BRONCHITIS, CHRONIC 01/02/2009  . COMMON MIGRAINE    "none since treating for high BP"  . Complication of anesthesia    "aspiration pneumonia after hand OR" (01/05/2013)  . DIABETES MELLITUS, TYPE II   . GOUT   . HYPERLIPIDEMIA   . HYPERTENSION   . Long term (current) use of anticoagulants   . Morbid obesity ([place])   . OBESITY HYPOVENTILATION SYNDROME    a. on home O2.  . On home oxygen therapy   . OSA (obstructive sleep apnea)   . Permanent atrial fibrillation ([place])   . PROSTATE SPECIFIC ANTIGEN, ELEVATED 06/09/2007  . Pulmonary HTN ([employer])    a.  multifactorial including obstructive sleep apnea, obesity hypoventilation syndrome and possible pulmonary venous hypertension.  Marland Kitchen SKIN RASH 03/12/2010  . Unspecified hearing loss    Past Surgical History:  Procedure Laterality Date  . APPENDECTOMY  1974  . CARDIOVERSION  05/21/2008; ~ 06/2008  . HERNIA REPAIR    . INGUINAL HERNIA REPAIR Right   . RIGHT HEART CATHETERIZATION N/A 01/08/2013   Procedure: RIGHT HEART CATH;  Surgeon: Thayer Headings, MD;  Location: [facility] CATH LAB;  Service: Cardiovascular;  Laterality: N/A;  . TENDON REPAIR Right 2009   "lacerated his tendon and pulley" Dr. Lenon Curt  . UMBILICAL HERNIA REPAIR     Social History   Socioeconomic History  . Marital status: Widowed    Spouse name: Not on file  . Number of children: 2  . Years of education: Not on file  . Highest education level: Not on file  Occupational History  . Occupation: Quarry manager: [employer]  Tobacco Use  . Smoking status: Former Smoker    Packs/day: 1.00    Years: 15.00    Pack years: 15.00    Types: Cigarettes    Quit date: 08/09/1978    Years since quitting: 42.4  . Smokeless tobacco: Never Used  . Tobacco comment: 01/05/2013 "quit smoking ~ 40 years ago"  Vaping Use  . Vaping Use: Never  used  Substance and Sexual Activity  . Alcohol use: Yes    Alcohol/week: 0.0 standard drinks    Comment: 01/05/2013 "hasn't had a drink since 1980's; never had problem w/it"  . Drug use: [name]  . Sexual activity: Not Currently  Other Topics Concern  . Not on file  Social History Narrative  . Not on file   Social Determinants of Health   Financial Resource Strain: Not on file  Food Insecurity: Not on file  Transportation Needs: Not on file  Physical Activity: Not on file  Stress: Not on file  Social Connections: Not on file   Allergies  Allergen Reactions  . Codeine     Altered mental status "goofy"  . Heparin Rash     Abdominal rash 01/2013   Family History  Problem  Relation Age of Onset  . Alzheimer's disease Father   . Prostate cancer Father   . Cancer Other        Breast Cancer, <50 yo 1st degree relative  . Coronary artery disease Other        Male, 1st degree relative    Current Outpatient Medications (Endocrine & Metabolic):  .  glipiZIDE (GLUCOTROL XL) 10 MG 24 hr tablet, TAKE 1 TABLET DAILY WITH BREAKFAST .  metFORMIN (GLUCOPHAGE-XR) 500 MG 24 hr tablet, TAKE 2 TABLETS IN THE MORNING   Current Outpatient Medications (Cardiovascular):  .  atorvastatin (LIPITOR) 20 MG tablet, TAKE 1 TABLET DAILY .  diltiazem (CARDIZEM CD) 180 MG 24 hr capsule, TAKE 1 CAPSULE DAILY (NEED OFFICE VISIT) .  diltiazem (TIAZAC) 180 MG 24 hr capsule,  .  metoprolol tartrate (LOPRESSOR) 25 MG tablet, TAKE 1 TABLET TWICE A DAY .  torsemide (DEMADEX) 20 MG tablet, TAKE 2 TABLETS TWICE A DAY (PLEASE CONTACT OFFICE FOR ADDITIONAL REFILLS)   Current Outpatient Medications (Respiratory):  .  cetirizine (ZYRTEC) 10 MG tablet, Take 10 mg by mouth at bedtime.   Current Outpatient Medications (Analgesics):  .  allopurinol (ZYLOPRIM) 100 MG tablet, TAKE 1 TABLET DAILY   Current Outpatient Medications (Hematological):  .  cyanocobalamin (,VITAMIN B-12,) 1000 MCG/ML injection, Inject 1,000 mcg into the muscle. Marland Kitchen  ELIQUIS 5 MG TABS tablet, TAKE 1 TABLET TWICE A DAY  Current Facility-Administered Medications (Hematological):  .  cyanocobalamin ((VITAMIN B-12)) injection 1,000 mcg  Current Outpatient Medications (Other):  .  cephALEXin (KEFLEX) 500 MG capsule,  .  cholecalciferol (VITAMIN D3) 25 MCG (1000 UNIT) tablet, Take 2,000 Units by mouth daily. Marland Kitchen  COVID-19 mRNA vaccine, Pfizer, 30 MCG/0.3ML injection,  .  glucose blood (ONE TOUCH ULTRA TEST) test strip, Use to check blood sugars once daily Dx E11.9 .  Lancets MISC, Use as directed once daily  E11.9 .  Misc Natural Products (TART CHERRY ADVANCED) CAPS, Take 1 capsule by mouth daily. .  OXYGEN, Inhale 3-5 L into  the lungs daily. 3L Daily  4L Resting  5L Exertion .  Potassium Chloride ER 20 MEQ TBCR, 20 mg. .  potassium chloride SA (KLOR-CON) 20 MEQ tablet,  .  potassium chloride SA (KLOR-CON) 20 MEQ tablet,  .  potassium chloride SA (KLOR-CON) 20 MEQ tablet, TAKE 1 TABLET TWICE A DAY .  tizanidine (ZANAFLEX) 2 MG capsule, Take 1 capsule (2 mg total) by mouth 3 (three) times daily. Marland Kitchen  Zoster Vaccine Adjuvanted [facility]) injection,     Reviewed prior external information including notes and imaging from  primary care provider As well as notes that were available from care everywhere and  other healthcare systems.  Past medical history, social, surgical and family history all reviewed in electronic medical record.  [name] pertanent information unless stated regarding to the chief complaint.   Review of Systems:  [name] headache, visual changes, nausea, vomiting, diarrhea, constipation, dizziness, abdominal pain, skin rash, fevers, chills, night sweats, weight loss, swollen lymph nodes, joint swelling, chest pain, shortness of breath, mood changes. POSITIVE muscle aches, body aches  Objective  Blood pressure 134/80, pulse 74, height 5\' 8"  (1.727 m), SpO2 94 %.   General: [name] apparent distress alert and oriented x3 mood and affect normal, dressed appropriately.  Overweight HEENT: Pupils equal, extraocular movements intact  Respiratory: Patient is wearing oxygen and very minorly short of breath. Cardiovascular: 1+ lower extremity edema, non tender, [name] erythema  Gait normal with good balance and coordination.  MSK:   Right shoulder exam shows the patient does have weakness noted of the rotator cuff with 3 out of 5 strength.  Patient does have positive crossover.  [name] swelling palpated over the acromioclavicular joint.  Mild limited external and internal range of motion.  Neurovascularly intact distally.  Right knee does show arthritic changes but very minimal tenderness to the medial joint space.  After  informed verbal consent, patient was seated on exam table. Right shoulder was prepped with alcohol swab and utilizing posterior approach, patient's right glenohumeral space was injected with 4:1  marcaine 0.5%: Kenalog 40mg /dL. Patient tolerated the procedure well without immediate complications.  After verbal consent patient was prepped with alcohol swabs and with a 25-gauge half inch needle injected into the right acromioclavicular joint from the superior angle.  0.5 cc of 0.5% Marcaine and 0.5 cc of Kenalog 40 mg/mL used.  [name] blood loss.  Band-Aid placed.  Postinjection instructions given.    Impression and Recommendations:     The above documentation has been reviewed and is accurate and complete Lyndal Pulley, DO

## 2020-12-25 ENCOUNTER — Ambulatory Visit (INDEPENDENT_AMBULATORY_CARE_PROVIDER_SITE_OTHER): Payer: Medicare Other | Admitting: Family Medicine

## 2020-12-25 ENCOUNTER — Other Ambulatory Visit: Payer: Self-pay

## 2020-12-25 ENCOUNTER — Ambulatory Visit (INDEPENDENT_AMBULATORY_CARE_PROVIDER_SITE_OTHER): Payer: Medicare Other

## 2020-12-25 ENCOUNTER — Encounter: Payer: Self-pay | Admitting: Family Medicine

## 2020-12-25 VITALS — BP 134/80 | HR 74 | Ht 68.0 in

## 2020-12-25 DIAGNOSIS — M25511 Pain in right shoulder: Secondary | ICD-10-CM

## 2020-12-25 DIAGNOSIS — G8929 Other chronic pain: Secondary | ICD-10-CM

## 2020-12-25 DIAGNOSIS — M75121 Complete rotator cuff tear or rupture of right shoulder, not specified as traumatic: Secondary | ICD-10-CM

## 2020-12-25 DIAGNOSIS — M1711 Unilateral primary osteoarthritis, right knee: Secondary | ICD-10-CM | POA: Diagnosis not present

## 2020-12-25 DIAGNOSIS — M19011 Primary osteoarthritis, right shoulder: Secondary | ICD-10-CM

## 2020-12-25 NOTE — Assessment & Plan Note (Signed)
Knee arthritis is stable at the moment.  We will continue to monitor.  May need repeated exercises and injections if it starts giving her more problems.

## 2020-12-25 NOTE — Patient Instructions (Addendum)
Injected both shoulder joints today Xray on your way out See me in 2-3 months

## 2020-12-25 NOTE — Assessment & Plan Note (Signed)
Repeat injection given today, tolerated the procedure well, discussed icing regimen and home exercises, discussed avoiding certain activities.  Once again secondary to patient's other comorbidities not likely a surgical candidate.  Patient is accompanied with daughter and all questions answered today.  Follow-up again in 2 months

## 2020-12-25 NOTE — Assessment & Plan Note (Signed)
Repeat injection given today.  Rotator cuff arthropathy likely As well.  X-rays pending.  Discussed icing regimen and home exercises.  Reviewed the patient's other comorbidities he is not a surgical candidate at this moment.  Discussed with patient about icing regimen and home exercises today.  Continue to avoid heavy lifting.  Follow-up with me again in 2 months

## 2021-01-12 ENCOUNTER — Encounter: Payer: Self-pay | Admitting: Internal Medicine

## 2021-01-14 ENCOUNTER — Encounter: Payer: Self-pay | Admitting: Internal Medicine

## 2021-01-14 ENCOUNTER — Other Ambulatory Visit: Payer: Self-pay

## 2021-01-14 ENCOUNTER — Ambulatory Visit (INDEPENDENT_AMBULATORY_CARE_PROVIDER_SITE_OTHER): Payer: Medicare Other | Admitting: Podiatry

## 2021-01-14 ENCOUNTER — Ambulatory Visit (INDEPENDENT_AMBULATORY_CARE_PROVIDER_SITE_OTHER): Payer: Medicare Other | Admitting: Internal Medicine

## 2021-01-14 ENCOUNTER — Encounter: Payer: Self-pay | Admitting: Podiatry

## 2021-01-14 VITALS — BP 126/78 | HR 83 | Ht 68.0 in | Wt 277.0 lb

## 2021-01-14 DIAGNOSIS — M79674 Pain in right toe(s): Secondary | ICD-10-CM | POA: Diagnosis not present

## 2021-01-14 DIAGNOSIS — E559 Vitamin D deficiency, unspecified: Secondary | ICD-10-CM | POA: Diagnosis not present

## 2021-01-14 DIAGNOSIS — M79675 Pain in left toe(s): Secondary | ICD-10-CM | POA: Diagnosis not present

## 2021-01-14 DIAGNOSIS — B351 Tinea unguium: Secondary | ICD-10-CM

## 2021-01-14 DIAGNOSIS — I10 Essential (primary) hypertension: Secondary | ICD-10-CM | POA: Diagnosis not present

## 2021-01-14 DIAGNOSIS — E119 Type 2 diabetes mellitus without complications: Secondary | ICD-10-CM

## 2021-01-14 DIAGNOSIS — M7989 Other specified soft tissue disorders: Secondary | ICD-10-CM

## 2021-01-14 NOTE — Progress Notes (Signed)
Patient ID: [name] [name], male   DOB: 1945/09/21, 75 y.o.   MRN: [id number]        Chief Complaint: left arm swelling       HPI:  [name] [name] is a 75 y.o. male here with c/o 2 days osnet left arm swelling unusual for him, without obvious trauma or joint swelling; fever, chills.  Pt denies chest pain, increased sob or doe, wheezing, orthopnea, PND, increased LE swelling, palpitations, dizziness or syncope.  Most of arm swelling localised to upper arm but o/w non specific and nothing seems tender.  No falls.   Pt denies polydipsia, polyuria, or new focal neuro s/s.  Taking Vit D and tolerating well.       Wt Readings from Last 3 Encounters:  01/14/21 277 lb (125.6 kg)  10/15/20 283 lb (128.4 kg)  10/10/20 286 lb (129.7 kg)   BP Readings from Last 3 Encounters:  01/14/21 126/78  12/25/20 134/80  10/15/20 (!) 146/90         Past Medical History:  Diagnosis Date   Acute right-sided CHF (congestive heart failure) ([place])    a. 01/2013.   Arthritis    "knees and hands; thoracic area of the spine" (01/05/2013)   BRONCHITIS, CHRONIC 01/02/2009   COMMON MIGRAINE    "none since treating for high BP"   Complication of anesthesia    "aspiration pneumonia after hand OR" (01/05/2013)   DIABETES MELLITUS, TYPE II    GOUT    HYPERLIPIDEMIA    HYPERTENSION    Long term (current) use of anticoagulants    Morbid obesity (HCC)    OBESITY HYPOVENTILATION SYNDROME    a. on home O2.   On home oxygen therapy    OSA (obstructive sleep apnea)    Permanent atrial fibrillation ([place])    PROSTATE SPECIFIC ANTIGEN, ELEVATED 06/09/2007   Pulmonary HTN ([place])    a. multifactorial including obstructive sleep apnea, obesity hypoventilation syndrome and possible pulmonary venous hypertension.   SKIN RASH 03/12/2010   Unspecified hearing loss    Past Surgical History:  Procedure Laterality Date   APPENDECTOMY  1974   CARDIOVERSION  05/21/2008; ~ 06/2008   HERNIA REPAIR     INGUINAL HERNIA REPAIR Right    RIGHT HEART  CATHETERIZATION N/A 01/08/2013   Procedure: RIGHT HEART CATH;  Surgeon: Thayer Headings, MD;  Location: [facility] CATH LAB;  Service: Cardiovascular;  Laterality: N/A;   TENDON REPAIR Right 2009   "lacerated his tendon and pulley" Dr. Lenon Curt   UMBILICAL HERNIA REPAIR      reports that he quit smoking about 42 years ago. His smoking use included cigarettes. He has a 15.00 pack-year smoking history. He has never used smokeless tobacco. He reports current alcohol use. He reports that he does not use drugs. family history includes Alzheimer's disease in his father; Cancer in an other family member; Coronary artery disease in an other family member; Prostate cancer in his father. Allergies  Allergen Reactions   Codeine     Altered mental status "goofy"   Heparin Rash     Abdominal rash 01/2013   Current Outpatient Medications on File Prior to Visit  Medication Sig Dispense Refill   allopurinol (ZYLOPRIM) 100 MG tablet TAKE 1 TABLET DAILY 90 tablet 3   atorvastatin (LIPITOR) 20 MG tablet TAKE 1 TABLET DAILY 90 tablet 3   cephALEXin (KEFLEX) 500 MG capsule      cetirizine (ZYRTEC) 10 MG tablet Take 10 mg by mouth at  bedtime.     cholecalciferol (VITAMIN D3) 25 MCG (1000 UNIT) tablet Take 2,000 Units by mouth daily.     COVID-19 mRNA vaccine, Pfizer, 30 MCG/0.3ML injection      cyanocobalamin (,VITAMIN B-12,) 1000 MCG/ML injection Inject 1,000 mcg into the muscle.     diltiazem (CARDIZEM CD) 180 MG 24 hr capsule TAKE 1 CAPSULE DAILY (NEED OFFICE VISIT) 90 capsule 3   diltiazem (TIAZAC) 180 MG 24 hr capsule      ELIQUIS 5 MG TABS tablet TAKE 1 TABLET TWICE A DAY 180 tablet 3   glipiZIDE (GLUCOTROL XL) 10 MG 24 hr tablet TAKE 1 TABLET DAILY WITH BREAKFAST 90 tablet 3   glucose blood (ONE TOUCH ULTRA TEST) test strip Use to check blood sugars once daily Dx E11.9 100 each 2   Lancets MISC Use as directed once daily  E11.9 100 each 12   metFORMIN (GLUCOPHAGE-XR) 500 MG 24 hr tablet TAKE 2 TABLETS IN THE  MORNING 180 tablet 3   metoprolol tartrate (LOPRESSOR) 25 MG tablet TAKE 1 TABLET TWICE A DAY 180 tablet 3   Misc Natural Products (TART CHERRY ADVANCED) CAPS Take 1 capsule by mouth daily.     OXYGEN Inhale 3-5 L into the lungs daily. 3L Daily  4L Resting  5L Exertion     Potassium Chloride ER 20 MEQ TBCR 20 mg.     potassium chloride SA (KLOR-CON) 20 MEQ tablet      potassium chloride SA (KLOR-CON) 20 MEQ tablet      potassium chloride SA (KLOR-CON) 20 MEQ tablet TAKE 1 TABLET TWICE A DAY 180 tablet 3   tizanidine (ZANAFLEX) 2 MG capsule Take 1 capsule (2 mg total) by mouth 3 (three) times daily. 30 capsule 0   torsemide (DEMADEX) 20 MG tablet TAKE 2 TABLETS TWICE A DAY (PLEASE CONTACT OFFICE FOR ADDITIONAL REFILLS) 360 tablet 0   Zoster Vaccine Adjuvanted [facility]) injection      Current Facility-Administered Medications on File Prior to Visit  Medication Dose Route Frequency Provider Last Rate Last Admin   cyanocobalamin ((VITAMIN B-12)) injection 1,000 mcg  1,000 mcg Intramuscular Q30 days [name], MD   1,000 mcg at 12/17/20 0936        ROS:  All others reviewed and negative.  Objective        PE:  BP 126/78 (BP Location: Left Arm, Patient Position: Sitting, Cuff Size: Large)   Pulse 83   Ht 5\' 8"  (1.727 m)   Wt 277 lb (125.6 kg)   SpO2 97%   BMI 42.12 kg/m                 Constitutional: Pt appears in NAD               HENT: Head: NCAT.                Right Ear: External ear normal.                 Left Ear: External ear normal.                Eyes: . Pupils are equal, round, and reactive to light. Conjunctivae and EOM are normal               Nose: without d/c or deformity               Neck: Neck supple. Gross normal ROM  Cardiovascular: Normal rate and regular rhythm.                 Pulmonary/Chest: Effort normal and breath sounds without rales or wheezing.                Abd:  Soft, NT, ND, + BS, no organomegaly               Neurological: Pt is  alert. At baseline orientation, motor grossly intact               Skin: LE edema - none; LUE has diffuse upper arm and shoulder swelling above the elbow but o/w nontender shoulder or elbow, no rash or red streaks               Psychiatric: Pt behavior is normal without agitation   Micro: none  Cardiac tracings I have personally interpreted today:  none  Pertinent Radiological findings (summarize): none   Lab Results  Component Value Date   WBC 10.9 (H) 10/10/2020   HGB 13.9 10/10/2020   HCT 42.8 10/10/2020   PLT 301.0 10/10/2020   GLUCOSE 99 10/10/2020   CHOL 97 10/10/2020   TRIG 124.0 10/10/2020   HDL 34.80 (L) 10/10/2020   LDLCALC 38 10/10/2020   ALT 10 10/10/2020   AST 11 10/10/2020   NA 146 (H) 10/10/2020   K 3.6 10/10/2020   CL 101 10/10/2020   CREATININE 0.94 10/10/2020   BUN 14 10/10/2020   CO2 38 (H) 10/10/2020   TSH 1.46 10/10/2020   PSA 1.75 10/10/2020   INR 1.25 01/09/2013   HGBA1C 6.6 (H) 10/10/2020   MICROALBUR 4.1 (H) 10/10/2020   Assessment/Plan:  [name] [name] is a 75 y.o. White or Caucasian [1] male with  has a past medical history of Acute right-sided CHF (congestive heart failure) ([place]), Arthritis, BRONCHITIS, CHRONIC (01/02/2009), COMMON MIGRAINE, Complication of anesthesia, DIABETES MELLITUS, TYPE II, GOUT, HYPERLIPIDEMIA, HYPERTENSION, Long term (current) use of anticoagulants, Morbid obesity ([place]), OBESITY HYPOVENTILATION SYNDROME, On home oxygen therapy, OSA (obstructive sleep apnea), Permanent atrial fibrillation ([place]), PROSTATE SPECIFIC ANTIGEN, ELEVATED (06/09/2007), Pulmonary HTN ([place]), SKIN RASH (03/12/2010), and Unspecified hearing loss.  Left arm swelling etiology unclear, cant r/o dvt, for LUE venojs dopplers,  to f/u any worsening symptoms or concerns   Non-insulin dependent type 2 diabetes mellitus (HCC) Lab Results  Component Value Date   HGBA1C 6.6 (H) 10/10/2020   Stable, pt to continue current medical treatment glucotrol,  metformin   Essential hypertension BP Readings from Last 3 Encounters:  01/14/21 126/78  12/25/20 134/80  10/15/20 (!) 146/90   Stable, pt to continue medical treatment cardizem, lopressor   Vitamin D deficiency Last vitamin D Lab Results  Component Value Date   VD25OH 35.79 10/10/2020   Stable, cont oral replacement  Followup: Return if symptoms worsen or fail to improve.  Cathlean Cower, MD 01/18/2021 10:00 PM [facility]

## 2021-01-15 ENCOUNTER — Encounter: Payer: Self-pay | Admitting: Internal Medicine

## 2021-01-15 NOTE — Telephone Encounter (Signed)
Please advise, Pt daughter is calling to inquire about the [place] of pt arm to rule out blood clot. After researching the pt chart and consulting with [facility] from referrals, there was not an order showing up to schedule time for [place].

## 2021-01-15 NOTE — Telephone Encounter (Signed)
   Daughter calling for status of [place] order

## 2021-01-16 ENCOUNTER — Ambulatory Visit (HOSPITAL_COMMUNITY)
Admission: RE | Admit: 2021-01-16 | Discharge: 2021-01-16 | Disposition: A | Discharge status: Home / Self Care | Payer: Medicare Other | Source: Ambulatory Visit | Attending: Internal Medicine | Admitting: Internal Medicine

## 2021-01-16 ENCOUNTER — Other Ambulatory Visit (HOSPITAL_COMMUNITY): Payer: Self-pay | Admitting: Internal Medicine

## 2021-01-16 ENCOUNTER — Other Ambulatory Visit (HOSPITAL_COMMUNITY): Payer: Self-pay | Admitting: Anesthesiology

## 2021-01-16 ENCOUNTER — Other Ambulatory Visit: Payer: Self-pay

## 2021-01-16 ENCOUNTER — Telehealth: Payer: Self-pay | Admitting: Cardiology

## 2021-01-16 ENCOUNTER — Telehealth: Payer: Self-pay

## 2021-01-16 DIAGNOSIS — M7989 Other specified soft tissue disorders: Secondary | ICD-10-CM

## 2021-01-16 NOTE — Telephone Encounter (Signed)
This RN called back Aretha from [employer] at [employer] and let her know our office had not received an order via fax. Katherine Roan was able to order the doppler on Epic. This RN to send message to [facility] and scheduling to reach out to patient.

## 2021-01-16 NOTE — Telephone Encounter (Signed)
Called [facility] on [address] to confirm that they receive an order for a doppler venous arms bilateral, they confirmed the order and stated they would reach out to the pt for scheduling.

## 2021-01-16 NOTE — Telephone Encounter (Signed)
[name] [name] is calling stating the faxed an order for the patient to have a doppler scheduled with our office yesterday. They are calling to see if the order was received. Please advise.

## 2021-01-16 NOTE — Telephone Encounter (Signed)
[facility] calling to report patient is negative for DVT in left arm

## 2021-01-18 ENCOUNTER — Encounter: Payer: Self-pay | Admitting: Internal Medicine

## 2021-01-18 DIAGNOSIS — M7989 Other specified soft tissue disorders: Secondary | ICD-10-CM | POA: Insufficient documentation

## 2021-01-18 NOTE — Assessment & Plan Note (Signed)
Lab Results  Component Value Date   HGBA1C 6.6 (H) 10/10/2020   Stable, pt to continue current medical treatment glucotrol, metformin

## 2021-01-18 NOTE — Assessment & Plan Note (Signed)
etiology unclear, cant r/o dvt, for LUE venojs dopplers,  to f/u any worsening symptoms or concerns

## 2021-01-18 NOTE — Assessment & Plan Note (Signed)
Last vitamin D Lab Results  Component Value Date   VD25OH 35.79 10/10/2020   Stable, cont oral replacement

## 2021-01-18 NOTE — Assessment & Plan Note (Signed)
BP Readings from Last 3 Encounters:  01/14/21 126/78  12/25/20 134/80  10/15/20 (!) 146/90   Stable, pt to continue medical treatment cardizem, lopressor

## 2021-01-18 NOTE — Patient Instructions (Signed)
[place] for left arm venous doppler

## 2021-01-20 NOTE — Progress Notes (Signed)
Subjective: [name] [name] is a pleasant 75 y.o. male patient seen today painful thick toenails that are difficult to trim. Pain interferes with ambulation. Aggravating factors include wearing enclosed shoe gear. Pain is relieved with periodic professional debridement.  PCP is [name], MD. Last visit was: 01/14/2021.  Allergies  Allergen Reactions   Codeine     Altered mental status "goofy"   Heparin Rash     Abdominal rash 01/2013    Objective: Physical Exam  General: Taedyn [name] [name] is a pleasant 75 y.o. Caucasian male, morbidly obese in NAD. AAO x 3.   Vascular:  Capillary fill time to digits <3 seconds b/l lower extremities. Palpable pedal pulses b/l LE. Pedal hair sparse. Lower extremity skin temperature gradient within normal limits. No pain with calf compression b/l. Nonpitting edema noted b/l lower extremities.  Dermatological:  Pedal skin with normal turgor, texture and tone bilaterally. No open wounds bilaterally. No interdigital macerations bilaterally. Toenails 1-5 b/l elongated, discolored, dystrophic, thickened, crumbly with subungual debris and tenderness to dorsal palpation.  Musculoskeletal:  Normal muscle strength 5/5 to all lower extremity muscle groups bilaterally. No pain crepitus or joint limitation noted with ROM b/l. Hallux valgus with bunion deformity noted b/l lower extremities. Hammertoe(s) noted to the L 2nd toe. Pes planus deformity noted b/l.   Neurological:  Protective sensation intact 5/5 intact bilaterally with 10g monofilament b/l. Vibratory sensation intact b/l.  Assessment and Plan:  1. Pain due to onychomycosis of toenails of both feet   2. Non-insulin dependent type 2 diabetes mellitus ([place])     -Examined patient. -Patient to continue soft, supportive shoe gear daily. -Toenails 1-5 b/l were debrided in length and girth with sterile nail nippers and dremel without iatrogenic bleeding.  -Patient to report any pedal injuries to medical professional  immediately. -Patient/POA to call should there be question/concern in the interim.  Return in about 3 months (around 04/16/2021) for nail trim.  Marzetta Board, DPM

## 2021-01-28 ENCOUNTER — Other Ambulatory Visit: Payer: Self-pay

## 2021-01-28 ENCOUNTER — Ambulatory Visit (INDEPENDENT_AMBULATORY_CARE_PROVIDER_SITE_OTHER): Payer: Medicare Other

## 2021-01-28 DIAGNOSIS — E538 Deficiency of other specified B group vitamins: Secondary | ICD-10-CM | POA: Diagnosis not present

## 2021-01-28 NOTE — Progress Notes (Addendum)
Patient came into the office to receive his vitamin b12 injection. Patient tolerated the injection well. No questions or concerns at this time.

## 2021-02-21 DIAGNOSIS — J9610 Chronic respiratory failure, unspecified whether with hypoxia or hypercapnia: Secondary | ICD-10-CM | POA: Diagnosis not present

## 2021-02-23 NOTE — Progress Notes (Deleted)
[facility] Phone: [phone] Subjective:    I'm seeing this patient by the request  of:  [name], MD  CC:   [id number]  12/25/2020 Repeat injection given today, tolerated the procedure well, discussed icing regimen and home exercises, discussed avoiding certain activities.  Once again secondary to patient's other comorbidities not likely a surgical candidate.  Patient is accompanied with daughter and all questions answered today.  Follow-up again in 2 months  Knee arthritis is stable at the moment.  We will continue to monitor.  May need repeated exercises and injections if it starts giving her more problems.  Update 02/24/2021 [name] [name] is a 75 y.o. male coming in with complaint of R knee and R shoulder pain. Patient states   Onset-  Location Duration-  Character- Aggravating factors- Reliving factors-  Therapies tried-  Severity-     Past Medical History:  Diagnosis Date   Acute right-sided CHF (congestive heart failure) ([place])    a. 01/2013.   Arthritis    "knees and hands; thoracic area of the spine" (01/05/2013)   BRONCHITIS, CHRONIC 01/02/2009   COMMON MIGRAINE    "none since treating for high BP"   Complication of anesthesia    "aspiration pneumonia after hand OR" (01/05/2013)   DIABETES MELLITUS, TYPE II    GOUT    HYPERLIPIDEMIA    HYPERTENSION    Long term (current) use of anticoagulants    Morbid obesity (HCC)    OBESITY HYPOVENTILATION SYNDROME    a. on home O2.   On home oxygen therapy    OSA (obstructive sleep apnea)    Permanent atrial fibrillation ([place])    PROSTATE SPECIFIC ANTIGEN, ELEVATED 06/09/2007   Pulmonary HTN ([place])    a. multifactorial including obstructive sleep apnea, obesity hypoventilation syndrome and possible pulmonary venous hypertension.   SKIN RASH 03/12/2010   Unspecified hearing loss    Past Surgical History:  Procedure Laterality Date   APPENDECTOMY   1974   CARDIOVERSION  05/21/2008; ~ 06/2008   HERNIA REPAIR     INGUINAL HERNIA REPAIR Right    RIGHT HEART CATHETERIZATION N/A 01/08/2013   Procedure: RIGHT HEART CATH;  Surgeon: Thayer Headings, MD;  Location: [facility] CATH LAB;  Service: Cardiovascular;  Laterality: N/A;   TENDON REPAIR Right 2009   "lacerated his tendon and pulley" Dr. Lenon Curt   UMBILICAL HERNIA REPAIR     Social History   Socioeconomic History   Marital status: Widowed    Spouse name: Not on file   Number of children: 2   Years of education: Not on file   Highest education level: Not on file  Occupational History   Occupation: Engineer, agricultural    Employer: [employer]  Tobacco Use   Smoking status: Former    Packs/day: 1.00    Years: 15.00    Pack years: 15.00    Types: Cigarettes    Quit date: 08/09/1978    Years since quitting: 42.5   Smokeless tobacco: Never   Tobacco comments:    01/05/2013 "quit smoking ~ 40 years ago"  Vaping Use   Vaping Use: Never used  Substance and Sexual Activity   Alcohol use: Yes    Alcohol/week: 0.0 standard drinks    Comment: 01/05/2013 "hasn't had a drink since 1980's; never had problem w/it"   Drug use: No   Sexual activity: Not Currently  Other Topics Concern   Not  on file  Social History Narrative   Not on file   Social Determinants of Health   Financial Resource Strain: Not on file  Food Insecurity: Not on file  Transportation Needs: Not on file  Physical Activity: Not on file  Stress: Not on file  Social Connections: Not on file   Allergies  Allergen Reactions   Codeine     Altered mental status "goofy"   Heparin Rash     Abdominal rash 01/2013   Family History  Problem Relation Age of Onset   Alzheimer's disease Father    Prostate cancer Father    Cancer Other        Breast Cancer, <50 yo 1st degree relative   Coronary artery disease Other        Male, 1st degree relative    Current Outpatient Medications (Endocrine & Metabolic):     glipiZIDE (GLUCOTROL XL) 10 MG 24 hr tablet, TAKE 1 TABLET DAILY WITH BREAKFAST   metFORMIN (GLUCOPHAGE-XR) 500 MG 24 hr tablet, TAKE 2 TABLETS IN THE MORNING   Current Outpatient Medications (Cardiovascular):    atorvastatin (LIPITOR) 20 MG tablet, TAKE 1 TABLET DAILY   diltiazem (CARDIZEM CD) 180 MG 24 hr capsule, TAKE 1 CAPSULE DAILY (NEED OFFICE VISIT)   diltiazem (TIAZAC) 180 MG 24 hr capsule,    metoprolol tartrate (LOPRESSOR) 25 MG tablet, TAKE 1 TABLET TWICE A DAY   torsemide (DEMADEX) 20 MG tablet, TAKE 2 TABLETS TWICE A DAY (PLEASE CONTACT OFFICE FOR ADDITIONAL REFILLS)   Current Outpatient Medications (Respiratory):    cetirizine (ZYRTEC) 10 MG tablet, Take 10 mg by mouth at bedtime.   Current Outpatient Medications (Analgesics):    allopurinol (ZYLOPRIM) 100 MG tablet, TAKE 1 TABLET DAILY   Current Outpatient Medications (Hematological):    cyanocobalamin (,VITAMIN B-12,) 1000 MCG/ML injection, Inject 1,000 mcg into the muscle.   ELIQUIS 5 MG TABS tablet, TAKE 1 TABLET TWICE A DAY  Current Facility-Administered Medications (Hematological):    cyanocobalamin ((VITAMIN B-12)) injection 1,000 mcg  Current Outpatient Medications (Other):    cephALEXin (KEFLEX) 500 MG capsule,    cholecalciferol (VITAMIN D3) 25 MCG (1000 UNIT) tablet, Take 2,000 Units by mouth daily.   COVID-19 mRNA vaccine, Pfizer, 30 MCG/0.3ML injection,    glucose blood (ONE TOUCH ULTRA TEST) test strip, Use to check blood sugars once daily Dx E11.9   Lancets MISC, Use as directed once daily  E11.9   Misc Natural Products (TART CHERRY ADVANCED) CAPS, Take 1 capsule by mouth daily.   OXYGEN, Inhale 3-5 L into the lungs daily. 3L Daily  4L Resting  5L Exertion   Potassium Chloride ER 20 MEQ TBCR, 20 mg.   potassium chloride SA (KLOR-CON) 20 MEQ tablet,    potassium chloride SA (KLOR-CON) 20 MEQ tablet,    potassium chloride SA (KLOR-CON) 20 MEQ tablet, TAKE 1 TABLET TWICE A DAY   tizanidine (ZANAFLEX) 2  MG capsule, Take 1 capsule (2 mg total) by mouth 3 (three) times daily.   Zoster Vaccine Adjuvanted [facility]) injection,     Reviewed prior external information including notes and imaging from  primary care provider As well as notes that were available from care everywhere and other healthcare systems.  Past medical history, social, surgical and family history all reviewed in electronic medical record.  No pertanent information unless stated regarding to the chief complaint.   Review of Systems:  No headache, visual changes, nausea, vomiting, diarrhea, constipation, dizziness, abdominal pain, skin rash, fevers, chills, night  sweats, weight loss, swollen lymph nodes, body aches, joint swelling, chest pain, shortness of breath, mood changes. POSITIVE muscle aches  Objective  There were no vitals taken for this visit.   General: No apparent distress alert and oriented x3 mood and affect normal, dressed appropriately.  HEENT: Pupils equal, extraocular movements intact  Respiratory: Patient's speak in full sentences and does not appear short of breath  Cardiovascular: No lower extremity edema, non tender, no erythema  Gait normal with good balance and coordination.  MSK:  Non tender with full range of motion and good stability and symmetric strength and tone of shoulders, elbows, wrist, hip, knee and ankles bilaterally.     Impression and Recommendations:     The above documentation has been reviewed and is accurate and complete Jacqualin Combes

## 2021-02-25 ENCOUNTER — Ambulatory Visit: Payer: Medicare Other | Admitting: Family Medicine

## 2021-02-27 ENCOUNTER — Other Ambulatory Visit: Payer: Self-pay

## 2021-02-27 ENCOUNTER — Ambulatory Visit (INDEPENDENT_AMBULATORY_CARE_PROVIDER_SITE_OTHER): Payer: Medicare Other

## 2021-02-27 DIAGNOSIS — E538 Deficiency of other specified B group vitamins: Secondary | ICD-10-CM

## 2021-02-27 NOTE — Progress Notes (Signed)
Pt here for monthly B12 injection per Dr. Jenny Reichmann  B12 1071mg given IM, and pt tolerated injection well.  Next B12 injection scheduled for 04/01/21

## 2021-03-24 DIAGNOSIS — J9610 Chronic respiratory failure, unspecified whether with hypoxia or hypercapnia: Secondary | ICD-10-CM | POA: Diagnosis not present

## 2021-03-25 ENCOUNTER — Other Ambulatory Visit: Payer: Self-pay | Admitting: Cardiology

## 2021-04-01 ENCOUNTER — Other Ambulatory Visit: Payer: Self-pay

## 2021-04-01 ENCOUNTER — Ambulatory Visit (INDEPENDENT_AMBULATORY_CARE_PROVIDER_SITE_OTHER): Payer: Medicare Other

## 2021-04-01 DIAGNOSIS — E538 Deficiency of other specified B group vitamins: Secondary | ICD-10-CM

## 2021-04-01 NOTE — Progress Notes (Signed)
Pt here for monthly B12 injection per Dr Jenny Reichmann.  B12 1034mg given IM left deltoid and pt tolerated injection well.  Next B12 injection scheduled for 04/30/21.

## 2021-04-08 ENCOUNTER — Telehealth: Payer: Self-pay | Admitting: Internal Medicine

## 2021-04-08 NOTE — Telephone Encounter (Signed)
LVM for pt to rtn my call at [phone] to schedule AWV with [facility]. Please schedule this appt if pt calls the office.

## 2021-04-16 ENCOUNTER — Other Ambulatory Visit: Payer: Self-pay

## 2021-04-16 ENCOUNTER — Encounter: Payer: Self-pay | Admitting: Internal Medicine

## 2021-04-16 ENCOUNTER — Ambulatory Visit (INDEPENDENT_AMBULATORY_CARE_PROVIDER_SITE_OTHER): Payer: Medicare Other | Admitting: Internal Medicine

## 2021-04-16 VITALS — BP 160/82 | HR 97 | Temp 99.6°F | Ht 68.0 in | Wt 282.0 lb

## 2021-04-16 DIAGNOSIS — E119 Type 2 diabetes mellitus without complications: Secondary | ICD-10-CM | POA: Diagnosis not present

## 2021-04-16 DIAGNOSIS — E611 Iron deficiency: Secondary | ICD-10-CM

## 2021-04-16 DIAGNOSIS — E559 Vitamin D deficiency, unspecified: Secondary | ICD-10-CM

## 2021-04-16 DIAGNOSIS — Z0001 Encounter for general adult medical examination with abnormal findings: Secondary | ICD-10-CM

## 2021-04-16 DIAGNOSIS — Z23 Encounter for immunization: Secondary | ICD-10-CM | POA: Diagnosis not present

## 2021-04-16 DIAGNOSIS — E538 Deficiency of other specified B group vitamins: Secondary | ICD-10-CM | POA: Diagnosis not present

## 2021-04-16 DIAGNOSIS — I10 Essential (primary) hypertension: Secondary | ICD-10-CM | POA: Diagnosis not present

## 2021-04-16 DIAGNOSIS — R972 Elevated prostate specific antigen [PSA]: Secondary | ICD-10-CM | POA: Diagnosis not present

## 2021-04-16 LAB — CBC WITH DIFFERENTIAL/PLATELET
Basophils Absolute: 0.1 10*3/uL (ref 0.0–0.1)
Basophils Relative: 0.8 % (ref 0.0–3.0)
Eosinophils Absolute: 0.3 10*3/uL (ref 0.0–0.7)
Eosinophils Relative: 2.6 % (ref 0.0–5.0)
HCT: 46.4 % (ref 39.0–52.0)
Hemoglobin: 14.8 g/dL (ref 13.0–17.0)
Lymphocytes Relative: 29.3 % (ref 12.0–46.0)
Lymphs Abs: 3.6 10*3/uL (ref 0.7–4.0)
MCHC: 31.9 g/dL (ref 30.0–36.0)
MCV: 88.2 fl (ref 78.0–100.0)
Monocytes Absolute: 1.1 10*3/uL — ABNORMAL HIGH (ref 0.1–1.0)
Monocytes Relative: 8.7 % (ref 3.0–12.0)
Neutro Abs: 7.1 10*3/uL (ref 1.4–7.7)
Neutrophils Relative %: 58.6 % (ref 43.0–77.0)
Platelets: 274 10*3/uL (ref 150.0–400.0)
RBC: 5.26 Mil/uL (ref 4.22–5.81)
RDW: 16.4 % — ABNORMAL HIGH (ref 11.5–15.5)
WBC: 12.2 10*3/uL — ABNORMAL HIGH (ref 4.0–10.5)

## 2021-04-16 LAB — LIPID PANEL
Cholesterol: 110 mg/dL (ref 0–200)
HDL: 38.6 mg/dL — ABNORMAL LOW (ref >39.00)
LDL Cholesterol: 38 mg/dL (ref 0–99)
NonHDL: 71.39
Total CHOL/HDL Ratio: 3
Triglycerides: 167 mg/dL — ABNORMAL HIGH (ref 0.0–149.0)
VLDL: 33.4 mg/dL (ref 0.0–40.0)

## 2021-04-16 LAB — HEPATIC FUNCTION PANEL
ALT: 11 U/L (ref 0–53)
AST: 12 U/L (ref 0–37)
Albumin: 3.6 g/dL (ref 3.5–5.2)
Alkaline Phosphatase: 75 U/L (ref 39–117)
Bilirubin, Direct: 0.2 mg/dL (ref 0.0–0.3)
Total Bilirubin: 1.1 mg/dL (ref 0.2–1.2)
Total Protein: 6.8 g/dL (ref 6.0–8.3)

## 2021-04-16 LAB — URINALYSIS, ROUTINE W REFLEX MICROSCOPIC
Bilirubin Urine: NEGATIVE
Hgb urine dipstick: NEGATIVE
Ketones, ur: NEGATIVE
Leukocytes,Ua: NEGATIVE
Nitrite: NEGATIVE
RBC / HPF: NONE SEEN (ref 0–?)
Specific Gravity, Urine: 1.01 (ref 1.000–1.030)
Total Protein, Urine: NEGATIVE
Urine Glucose: NEGATIVE
Urobilinogen, UA: 0.2 (ref 0.0–1.0)
pH: 7 (ref 5.0–8.0)

## 2021-04-16 LAB — HEMOGLOBIN A1C: Hgb A1c MFr Bld: 6.6 % — ABNORMAL HIGH (ref 4.6–6.5)

## 2021-04-16 LAB — BASIC METABOLIC PANEL
BUN: 15 mg/dL (ref 6–23)
CO2: 34 mEq/L — ABNORMAL HIGH (ref 19–32)
Calcium: 9.6 mg/dL (ref 8.4–10.5)
Chloride: 104 mEq/L (ref 96–112)
Creatinine, Ser: 1.15 mg/dL (ref 0.40–1.50)
GFR: 62.32 mL/min (ref >60.00)
Glucose, Bld: 80 mg/dL (ref 70–99)
Potassium: 4.2 mEq/L (ref 3.5–5.1)
Sodium: 147 mEq/L — ABNORMAL HIGH (ref 135–145)

## 2021-04-16 LAB — MICROALBUMIN / CREATININE URINE RATIO
Creatinine,U: 23.7 mg/dL
Microalb Creat Ratio: 25.1 mg/g (ref 0.0–30.0)
Microalb, Ur: 6 mg/dL — ABNORMAL HIGH (ref 0.0–1.9)

## 2021-04-16 LAB — IBC PANEL
Iron: 63 ug/dL (ref 42–165)
Saturation Ratios: 16.7 % — ABNORMAL LOW (ref 20.0–50.0)
TIBC: 378 ug/dL (ref 250.0–450.0)
Transferrin: 270 mg/dL (ref 212.0–360.0)

## 2021-04-16 LAB — TSH: TSH: 1.8 u[IU]/mL (ref 0.35–5.50)

## 2021-04-16 LAB — VITAMIN D 25 HYDROXY (VIT D DEFICIENCY, FRACTURES): VITD: 29.08 ng/mL — ABNORMAL LOW (ref 30.00–100.00)

## 2021-04-16 LAB — FERRITIN: Ferritin: 38.7 ng/mL (ref 22.0–322.0)

## 2021-04-16 LAB — VITAMIN B12: Vitamin B-12: 513 pg/mL (ref 211–911)

## 2021-04-16 LAB — PSA: PSA: 1.87 ng/mL (ref 0.10–4.00)

## 2021-04-16 NOTE — Patient Instructions (Addendum)
You had the flu shot today  Ok to increase the Vitamin D to 4000 units per day  Please continue the B12 shots monthly  Please continue all other medications as before, and refills have been done if requested.  Please have the pharmacy call with any other refills you may need.  Please continue your efforts at being more active, low cholesterol diet, and weight control.  You are otherwise up to date with prevention measures today.  Please keep your appointments with your specialists as you may have planned  Please go to the LAB at the blood drawing area for the tests to be done  You will be contacted by phone if any changes need to be made immediately.  Otherwise, you will receive a letter about your results with an explanation, but please check with MyChart first.  Please remember to sign up for MyChart if you have not done so, as this will be important to you in the future with finding out test results, communicating by private email, and scheduling acute appointments online when needed.  Please make an Appointment to return in 6 months, or sooner if needed

## 2021-04-16 NOTE — Progress Notes (Signed)
Patient ID: [name] [name], male   DOB: 1946/07/07, 75 y.o.   MRN: [id number]         Chief Complaint:: wellness exam and Follow-up  Htn, low vit d, b12 , dm        HPI:  [name] [name] is a 75 y.o. male here for wellness exam; plans to see optho himself soon; decliens covid booster, shingrix, colnoscopy o/w up to date with preventive referrals and immunizations                        Also Pt denies chest pain, increased sob or doe, wheezing, orthopnea, PND, increased LE swelling, palpitations, dizziness or syncope.   Pt denies polydipsia, polyuria, or new focal neuro s/s.   Pt denies fever, wt loss, night sweats, loss of appetite, or other constitutional symptoms   not taking vit d.   Wt Readings from Last 3 Encounters:  04/16/21 282 lb (127.9 kg)  01/14/21 277 lb (125.6 kg)  10/15/20 283 lb (128.4 kg)   BP Readings from Last 3 Encounters:  04/16/21 (!) 160/82  01/14/21 126/78  12/25/20 134/80   Immunization History  Administered Date(s) Administered   Fluad Quad(high Dose 65+) 05/10/2019, 06/06/2020, 04/16/2021   H1N1 07/15/2008   Influenza Split 05/17/2011, 06/09/2012   Influenza Whole 05/09/2008, 06/24/2009, 06/26/2010   Influenza, High Dose Seasonal PF 04/15/2015, 04/19/2017, 04/25/2018   Influenza,inj,Quad PF,6+ Mos 04/05/2013, 04/11/2014, 04/13/2016   PFIZER(Purple Top)SARS-COV-2 Vaccination 10/07/2019, 10/31/2019, 08/30/2020   Pneumococcal Conjugate-13 06/01/2013, 10/09/2013   Pneumococcal Polysaccharide-23 02/07/2008, 10/19/2017   Td 02/07/2008   Tdap 04/25/2018   Zoster Recombinat (Shingrix) 04/06/2019   Zoster, Live 07/02/2008   There are no preventive care reminders to display for this patient.     Past Medical History:  Diagnosis Date   Acute right-sided CHF (congestive heart failure) ([place])    a. 01/2013.   Arthritis    "knees and hands; thoracic area of the spine" (01/05/2013)   BRONCHITIS, CHRONIC 01/02/2009   COMMON MIGRAINE    "none since treating for high BP"    Complication of anesthesia    "aspiration pneumonia after hand OR" (01/05/2013)   DIABETES MELLITUS, TYPE II    GOUT    HYPERLIPIDEMIA    HYPERTENSION    Long term (current) use of anticoagulants    Morbid obesity (HCC)    OBESITY HYPOVENTILATION SYNDROME    a. on home O2.   On home oxygen therapy    OSA (obstructive sleep apnea)    Permanent atrial fibrillation ([place])    PROSTATE SPECIFIC ANTIGEN, ELEVATED 06/09/2007   Pulmonary HTN ([place])    a. multifactorial including obstructive sleep apnea, obesity hypoventilation syndrome and possible pulmonary venous hypertension.   SKIN RASH 03/12/2010   Unspecified hearing loss    Past Surgical History:  Procedure Laterality Date   APPENDECTOMY  1974   CARDIOVERSION  05/21/2008; ~ 06/2008   HERNIA REPAIR     INGUINAL HERNIA REPAIR Right    RIGHT HEART CATHETERIZATION N/A 01/08/2013   Procedure: RIGHT HEART CATH;  Surgeon: Thayer Headings, MD;  [facility];  Service: Cardiovascular;  Laterality: N/A;   TENDON REPAIR Right 2009   "lacerated his tendon and pulley" Dr. Lenon Curt   UMBILICAL HERNIA REPAIR      reports that he quit smoking about 42 years ago. His smoking use included cigarettes. He has a 15.00 pack-year smoking history. He has never used smokeless tobacco. He reports current  alcohol use. He reports that he does not use drugs. family history includes Alzheimer's disease in his father; Cancer in an other family member; Coronary artery disease in an other family member; Prostate cancer in his father. Allergies  Allergen Reactions   Codeine     Altered mental status "goofy"   Heparin Rash     Abdominal rash 01/2013   Current Outpatient Medications on File Prior to Visit  Medication Sig Dispense Refill   allopurinol (ZYLOPRIM) 100 MG tablet TAKE 1 TABLET DAILY 90 tablet 3   atorvastatin (LIPITOR) 20 MG tablet TAKE 1 TABLET DAILY 90 tablet 3   cetirizine (ZYRTEC) 10 MG tablet Take 10 mg by mouth at bedtime.     cholecalciferol  (VITAMIN D3) 25 MCG (1000 UNIT) tablet Take 2,000 Units by mouth daily.     cyanocobalamin (,VITAMIN B-12,) 1000 MCG/ML injection Inject 1,000 mcg into the muscle.     diltiazem (CARDIZEM CD) 180 MG 24 hr capsule TAKE 1 CAPSULE DAILY (NEED OFFICE VISIT) 90 capsule 3   diltiazem (TIAZAC) 180 MG 24 hr capsule      ELIQUIS 5 MG TABS tablet TAKE 1 TABLET TWICE A DAY 180 tablet 3   glipiZIDE (GLUCOTROL XL) 10 MG 24 hr tablet TAKE 1 TABLET DAILY WITH BREAKFAST 90 tablet 3   glucose blood (ONE TOUCH ULTRA TEST) test strip Use to check blood sugars once daily Dx E11.9 100 each 2   Lancets MISC Use as directed once daily  E11.9 100 each 12   metFORMIN (GLUCOPHAGE-XR) 500 MG 24 hr tablet TAKE 2 TABLETS IN THE MORNING 180 tablet 3   metoprolol tartrate (LOPRESSOR) 25 MG tablet TAKE 1 TABLET TWICE A DAY 180 tablet 3   Misc Natural Products (TART CHERRY ADVANCED) CAPS Take 1 capsule by mouth daily.     OXYGEN Inhale 3-5 L into the lungs daily. 3L Daily  4L Resting  5L Exertion     Potassium Chloride ER 20 MEQ TBCR 20 mg.     potassium chloride SA (KLOR-CON) 20 MEQ tablet      potassium chloride SA (KLOR-CON) 20 MEQ tablet      potassium chloride SA (KLOR-CON) 20 MEQ tablet TAKE 1 TABLET TWICE A DAY 180 tablet 3   tizanidine (ZANAFLEX) 2 MG capsule Take 1 capsule (2 mg total) by mouth 3 (three) times daily. 30 capsule 0   torsemide (DEMADEX) 20 MG tablet TAKE 2 TABLETS TWICE A DAY (PLEASE CONTACT OFFICE FOR ADDITIONAL REFILLS) 120 tablet 0   Zoster Vaccine Adjuvanted [facility]) injection      Current Facility-Administered Medications on File Prior to Visit  Medication Dose Route Frequency Provider Last Rate Last Admin   cyanocobalamin ((VITAMIN B-12)) injection 1,000 mcg  1,000 mcg Intramuscular Q30 days [name], MD   1,000 mcg at 04/01/21 0935        ROS:  All others reviewed and negative.  Objective        PE:  BP (!) 160/82 (BP Location: Left Arm, Patient Position: Sitting, Cuff Size: Large)    Pulse 97   Temp 99.6 F (37.6 C) (Oral)   Ht '5\' 8"'$  (1.727 m)   Wt 282 lb (127.9 kg)   SpO2 96%   BMI 42.88 kg/m                 Constitutional: Pt appears in NAD               HENT: Head: NCAT.  Right Ear: External ear normal.                 Left Ear: External ear normal.                Eyes: . Pupils are equal, round, and reactive to light. Conjunctivae and EOM are normal               Nose: without d/c or deformity               Neck: Neck supple. Gross normal ROM               Cardiovascular: Normal rate and regular rhythm.                 Pulmonary/Chest: Effort normal and breath sounds without rales or wheezing.                Abd:  Soft, NT, ND, + BS, no organomegaly               Neurological: Pt is alert. At baseline orientation, motor grossly intact               Skin: Skin is warm. No rashes, no other new lesions, LE edema - chronic 1+ to knees               Psychiatric: Pt behavior is normal without agitation   Micro: none  Cardiac tracings I have personally interpreted today:  none  Pertinent Radiological findings (summarize): none   Lab Results  Component Value Date   WBC 12.2 (H) 04/16/2021   HGB 14.8 04/16/2021   HCT 46.4 04/16/2021   PLT 274.0 04/16/2021   GLUCOSE 80 04/16/2021   CHOL 110 04/16/2021   TRIG 167.0 (H) 04/16/2021   HDL 38.60 (L) 04/16/2021   LDLCALC 38 04/16/2021   ALT 11 04/16/2021   AST 12 04/16/2021   NA 147 (H) 04/16/2021   K 4.2 04/16/2021   CL 104 04/16/2021   CREATININE 1.15 04/16/2021   BUN 15 04/16/2021   CO2 34 (H) 04/16/2021   TSH 1.80 04/16/2021   PSA 1.87 04/16/2021   INR 1.25 01/09/2013   HGBA1C 6.6 (H) 04/16/2021   MICROALBUR 6.0 (H) 04/16/2021   Assessment/Plan:  [name] [name] is a 75 y.o. White or Caucasian [1] male with  has a past medical history of Acute right-sided CHF (congestive heart failure) ([place]), Arthritis, BRONCHITIS, CHRONIC (01/02/2009), COMMON MIGRAINE, Complication of anesthesia, DIABETES  MELLITUS, TYPE II, GOUT, HYPERLIPIDEMIA, HYPERTENSION, Long term (current) use of anticoagulants, Morbid obesity ([place]), OBESITY HYPOVENTILATION SYNDROME, On home oxygen therapy, OSA (obstructive sleep apnea), Permanent atrial fibrillation ([place]), PROSTATE SPECIFIC ANTIGEN, ELEVATED (06/09/2007), Pulmonary HTN ([place]), SKIN RASH (03/12/2010), and Unspecified hearing loss.  Vitamin D deficiency Last vitamin D Lab Results  Component Value Date   VD25OH 29.08 (L) 04/16/2021   Low, to start oral replacement   Encounter for well adult exam with abnormal findings Age and sex appropriate education and counseling updated with regular exercise and diet Referrals for preventative services - to see optho himself soon, declines colonoscopy Immunizations addressed - declines covid booster, shingrix Smoking counseling  - none needed Evidence for depression or other mood disorder - none significant Most recent labs reviewed. I have personally reviewed and have noted: 1) the patient's medical and social history 2) The patient's current medications and supplements 3) The patient's height, weight, and BMI have been recorded in the chart   Non-insulin dependent type  2 diabetes mellitus ([place]) Lab Results  Component Value Date   HGBA1C 6.6 (H) 04/16/2021   Stable, pt to continue current medical treatment glucotrol, metformin   B12 deficiency Lab Results  Component Value Date   [id number] 04/16/2021   Stable, cont oral replacement - b12 1000 mcg qd   Essential hypertension BP Readings from Last 3 Encounters:  04/16/21 (!) 160/82  01/14/21 126/78  12/25/20 134/80   Uncontrolled today, pt states BP at home < 140/90, pt to continue medical treatment cardizem, lopressor   Increased prostate specific antigen (PSA) velocity Asympt, also for f/u psa  Followup: Return in about 6 months (around 10/14/2021).  Cathlean Cower, MD 04/19/2021 9:12 PM [facility]

## 2021-04-19 ENCOUNTER — Encounter: Payer: Self-pay | Admitting: Internal Medicine

## 2021-04-19 NOTE — Assessment & Plan Note (Signed)
BP Readings from Last 3 Encounters:  04/16/21 (!) 160/82  01/14/21 126/78  12/25/20 134/80   Uncontrolled today, pt states BP at home < 140/90, pt to continue medical treatment cardizem, lopressor

## 2021-04-19 NOTE — Assessment & Plan Note (Signed)
Age and sex appropriate education and counseling updated with regular exercise and diet Referrals for preventative services - to see optho himself soon, declines colonoscopy Immunizations addressed - declines covid booster, shingrix Smoking counseling  - none needed Evidence for depression or other mood disorder - none significant Most recent labs reviewed. I have personally reviewed and have noted: 1) the patient's medical and social history 2) The patient's current medications and supplements 3) The patient's height, weight, and BMI have been recorded in the chart

## 2021-04-19 NOTE — Assessment & Plan Note (Signed)
Last vitamin D Lab Results  Component Value Date   VD25OH 29.08 (L) 04/16/2021   Low, to start oral replacement

## 2021-04-19 NOTE — Assessment & Plan Note (Signed)
Lab Results  Component Value Date   HGBA1C 6.6 (H) 04/16/2021   Stable, pt to continue current medical treatment glucotrol, metformin

## 2021-04-19 NOTE — Assessment & Plan Note (Signed)
Lab Results  Component Value Date   [id number] 04/16/2021   Stable, cont oral replacement - b12 1000 mcg qd

## 2021-04-19 NOTE — Assessment & Plan Note (Signed)
Asympt, also for f/u psa 

## 2021-04-22 ENCOUNTER — Ambulatory Visit: Payer: TRICARE For Life (TFL) | Admitting: Podiatry

## 2021-04-24 DIAGNOSIS — J9610 Chronic respiratory failure, unspecified whether with hypoxia or hypercapnia: Secondary | ICD-10-CM | POA: Diagnosis not present

## 2021-04-30 ENCOUNTER — Ambulatory Visit (INDEPENDENT_AMBULATORY_CARE_PROVIDER_SITE_OTHER): Payer: Medicare Other

## 2021-04-30 ENCOUNTER — Other Ambulatory Visit: Payer: Self-pay

## 2021-04-30 DIAGNOSIS — E538 Deficiency of other specified B group vitamins: Secondary | ICD-10-CM

## 2021-04-30 MED ORDER — CYANOCOBALAMIN 1000 MCG/ML IJ SOLN
1000.0000 ug | Freq: Once | INTRAMUSCULAR | Status: AC
  Administered 2021-04-30: 1000 ug via INTRAMUSCULAR

## 2021-04-30 NOTE — Progress Notes (Signed)
Patient ID: [name] [name], male   DOB: October 03, 1945, 75 y.o.   MRN: [id number] Medical screening examination/treatment/procedure(s) were performed by non-physician practitioner and as supervising physician I was immediately available for consultation/collaboration.  I agree with above. Cathlean Cower, MD

## 2021-04-30 NOTE — Progress Notes (Signed)
Vitamin B12 injection given today, patient tolerated well.

## 2021-05-10 ENCOUNTER — Other Ambulatory Visit: Payer: Self-pay | Admitting: Cardiology

## 2021-05-24 DIAGNOSIS — J9610 Chronic respiratory failure, unspecified whether with hypoxia or hypercapnia: Secondary | ICD-10-CM | POA: Diagnosis not present

## 2021-05-26 ENCOUNTER — Telehealth: Payer: Self-pay | Admitting: Internal Medicine

## 2021-05-26 NOTE — Telephone Encounter (Signed)
Left message for patient to call me back at [phone] to schedule Medicare Annual Wellness Visit   No hx of AWV eligible as of 12/08/11  Please schedule at anytime with [facility] Advisor if patient calls the office back.    40 Minutes appointment   Any questions, please call me at [phone]

## 2021-06-03 ENCOUNTER — Other Ambulatory Visit: Payer: Self-pay

## 2021-06-03 ENCOUNTER — Ambulatory Visit (INDEPENDENT_AMBULATORY_CARE_PROVIDER_SITE_OTHER): Payer: Medicare Other

## 2021-06-03 DIAGNOSIS — E538 Deficiency of other specified B group vitamins: Secondary | ICD-10-CM

## 2021-06-03 MED ORDER — CYANOCOBALAMIN 1000 MCG/ML IJ SOLN
1000.0000 ug | Freq: Once | INTRAMUSCULAR | Status: AC
  Administered 2021-06-03: 1000 ug via INTRAMUSCULAR

## 2021-06-03 NOTE — Progress Notes (Signed)
Pt given B12 vacc w/o any complications. 

## 2021-06-11 NOTE — Progress Notes (Signed)
HPI:FU permanent AFib and right side CHF. He has failed DCCV in the past. Myoview 7/09: EF 54%, normal perfusion. Holter 09/2010: AFib with mildly decreased rate. Right heart failure felt secondary to pulmonary hypertension which is multifactorial including OSA, OHS and possible pulmonary venous hypertension. [place] 01/08/13 demonstrated elevated R heart pressures (RA 15, RV 44/11, PCWP 21, PA 41/30, CO 4.49, CI 1.8, PA sat 61%). Patient has had some degree of renal insufficiency and hyperkalemia on a combination of ACE inhibitor and spironolactone. Last echo 5/17 showed EF 55-60, biatrial enlargement, small pericardial effusion. Since last seen patient he has chronic dyspnea on exertion which is unchanged and controlled with home oxygen.  No orthopnea or PND.  He has noticed increased scrotal edema.  He denies exertional chest pain, palpitations or syncope.  Current Outpatient Medications  Medication Sig Dispense Refill   allopurinol (ZYLOPRIM) 100 MG tablet TAKE 1 TABLET DAILY 90 tablet 3   atorvastatin (LIPITOR) 20 MG tablet TAKE 1 TABLET DAILY 90 tablet 3   cetirizine (ZYRTEC) 10 MG tablet Take 10 mg by mouth at bedtime.     cholecalciferol (VITAMIN D3) 25 MCG (1000 UNIT) tablet Take 2,000 Units by mouth daily.     cyanocobalamin (,VITAMIN B-12,) 1000 MCG/ML injection Inject 1,000 mcg into the muscle.     diltiazem (CARDIZEM CD) 180 MG 24 hr capsule TAKE 1 CAPSULE DAILY (NEED OFFICE VISIT) 90 capsule 3   ELIQUIS 5 MG TABS tablet TAKE 1 TABLET TWICE A DAY 180 tablet 3   glipiZIDE (GLUCOTROL XL) 10 MG 24 hr tablet TAKE 1 TABLET DAILY WITH BREAKFAST 90 tablet 3   glucose blood (ONE TOUCH ULTRA TEST) test strip Use to check blood sugars once daily Dx E11.9 100 each 2   Lancets MISC Use as directed once daily  E11.9 100 each 12   metFORMIN (GLUCOPHAGE-XR) 500 MG 24 hr tablet TAKE 2 TABLETS IN THE MORNING 180 tablet 3   metoprolol tartrate (LOPRESSOR) 25 MG tablet TAKE 1 TABLET TWICE A DAY 180  tablet 3   Misc Natural Products (TART CHERRY ADVANCED) CAPS Take 1 capsule by mouth daily.     OXYGEN Inhale 3-5 L into the lungs daily. 3L Daily  4L Resting  5L Exertion     Potassium Chloride ER 20 MEQ TBCR 20 mg.     torsemide (DEMADEX) 20 MG tablet Take 2 tablets (40 mg total) by mouth 2 (two) times daily. 180 tablet 3   Zoster Vaccine Adjuvanted [facility]) injection      diltiazem (TIAZAC) 180 MG 24 hr capsule      potassium chloride SA (KLOR-CON) 20 MEQ tablet      potassium chloride SA (KLOR-CON) 20 MEQ tablet      potassium chloride SA (KLOR-CON) 20 MEQ tablet TAKE 1 TABLET TWICE A DAY 180 tablet 3   tizanidine (ZANAFLEX) 2 MG capsule Take 1 capsule (2 mg total) by mouth 3 (three) times daily. (Patient not taking: Reported on 06/22/2021) 30 capsule 0   Current Facility-Administered Medications  Medication Dose Route Frequency Provider Last Rate Last Admin   cyanocobalamin ((VITAMIN B-12)) injection 1,000 mcg  1,000 mcg Intramuscular Q30 days [name], MD   1,000 mcg at 04/01/21 0935     Past Medical History:  Diagnosis Date   Acute right-sided CHF (congestive heart failure) ([place])    a. 01/2013.   Arthritis    "knees and hands; thoracic area of the spine" (01/05/2013)   BRONCHITIS, CHRONIC  01/02/2009   COMMON MIGRAINE    "none since treating for high BP"   Complication of anesthesia    "aspiration pneumonia after hand OR" (01/05/2013)   DIABETES MELLITUS, TYPE II    GOUT    HYPERLIPIDEMIA    HYPERTENSION    Long term (current) use of anticoagulants    Morbid obesity ([place])    OBESITY HYPOVENTILATION SYNDROME    a. on home O2.   On home oxygen therapy    OSA (obstructive sleep apnea)    Permanent atrial fibrillation ([place])    PROSTATE SPECIFIC ANTIGEN, ELEVATED 06/09/2007   Pulmonary HTN ([place])    a. multifactorial including obstructive sleep apnea, obesity hypoventilation syndrome and possible pulmonary venous hypertension.   SKIN RASH 03/12/2010   Unspecified  hearing loss     Past Surgical History:  Procedure Laterality Date   APPENDECTOMY  1974   CARDIOVERSION  05/21/2008; ~ 06/2008   HERNIA REPAIR     INGUINAL HERNIA REPAIR Right    RIGHT HEART CATHETERIZATION N/A 01/08/2013   Procedure: RIGHT HEART CATH;  Surgeon: Thayer Headings, MD;  Location: [facility] CATH LAB;  Service: Cardiovascular;  Laterality: N/A;   TENDON REPAIR Right 2009   "lacerated his tendon and pulley" Dr. Lenon Curt   UMBILICAL HERNIA REPAIR      Social History   Socioeconomic History   Marital status: Widowed    Spouse name: Not on file   Number of children: 2   Years of education: Not on file   Highest education level: Not on file  Occupational History   Occupation: Engineer, agricultural    Employer: [employer]  Tobacco Use   Smoking status: Former    Packs/day: 1.00    Years: 15.00    Pack years: 15.00    Types: Cigarettes    Quit date: 08/09/1978    Years since quitting: 42.8   Smokeless tobacco: Never   Tobacco comments:    01/05/2013 "quit smoking ~ 40 years ago"  Vaping Use   Vaping Use: Never used  Substance and Sexual Activity   Alcohol use: Yes    Alcohol/week: 0.0 standard drinks    Comment: 01/05/2013 "hasn't had a drink since 1980's; never had problem w/it"   Drug use: No   Sexual activity: Not Currently  Other Topics Concern   Not on file  Social History Narrative   Not on file   Social Determinants of Health   Financial Resource Strain: Not on file  Food Insecurity: Not on file  Transportation Needs: Not on file  Physical Activity: Not on file  Stress: Not on file  Social Connections: Not on file  Intimate Partner Violence: Not on file    Family History  Problem Relation Age of Onset   Alzheimer's disease Father    Prostate cancer Father    Cancer Other        Breast Cancer, <50 yo 1st degree relative   Coronary artery disease Other        Male, 1st degree relative    ROS: no fevers or chills, productive cough, hemoptysis,  dysphasia, odynophagia, melena, hematochezia, dysuria, hematuria, rash, seizure activity, orthopnea, PND, pedal edema, claudication. Remaining systems are negative.  Physical Exam: Well-developed well-nourished in no acute distress.  Skin is warm and dry.  HEENT is normal.  Neck is supple.  Chest is clear to auscultation with normal expansion.  Cardiovascular exam is irregular Abdominal exam nontender or distended. No masses palpated. Extremities show chronic skin  changes. neuro grossly intact  ECG-atrial fibrillation at a rate of 59, right axis deviation.  Personally reviewed  A/P  1 permanent atrial fibrillation-continue Cardizem and metoprolol at present dose for rate control.  Continue apixaban.  2 chronic diastolic congestive heart failure-Predominant issue is right-sided heart failure from obesity hypoventilation syndrome and sleep apnea.  He has noticed increased scrotal edema which is improving on higher dose Demadex.  I have asked him to increase to 60 mg twice daily for 2 days then resume 60 mg in the morning and 40 mg in the evening.  Continue fluid restriction and low-sodium diet.  Check potassium and renal function.  Repeat echocardiogram.  3 hypertension-blood pressure controlled.  Continue present medications.  4 hyperlipidemia-continue statin.  5 morbid obesity-we again discussed the importance of diet and weight loss.  Kirk Ruths, MD

## 2021-06-15 MED ORDER — TORSEMIDE 20 MG PO TABS: 40.0000 mg | ORAL_TABLET | Freq: Two times a day (BID) | ORAL | 3 refills | Status: DC

## 2021-06-22 ENCOUNTER — Other Ambulatory Visit: Payer: Self-pay

## 2021-06-22 ENCOUNTER — Ambulatory Visit (INDEPENDENT_AMBULATORY_CARE_PROVIDER_SITE_OTHER): Payer: Medicare Other | Admitting: Cardiology

## 2021-06-22 ENCOUNTER — Encounter: Payer: Self-pay | Admitting: Cardiology

## 2021-06-22 VITALS — BP 138/84 | HR 59 | Ht 68.0 in | Wt 276.6 lb

## 2021-06-22 DIAGNOSIS — I5032 Chronic diastolic (congestive) heart failure: Secondary | ICD-10-CM | POA: Diagnosis not present

## 2021-06-22 DIAGNOSIS — E7800 Pure hypercholesterolemia, unspecified: Secondary | ICD-10-CM | POA: Diagnosis not present

## 2021-06-22 DIAGNOSIS — I4821 Permanent atrial fibrillation: Secondary | ICD-10-CM | POA: Diagnosis not present

## 2021-06-22 DIAGNOSIS — I10 Essential (primary) hypertension: Secondary | ICD-10-CM

## 2021-06-22 NOTE — Patient Instructions (Signed)
Medication Instructions:   INCREASE TORSEMIDE TO 60 MG TWICE DAILY X 2 DAYS AND THEN REDUCE TO 60 MG IN THE MORNING AND 40 MG IN THE AFTERNOON  *If you need a refill on your cardiac medications before your next appointment, please call your pharmacy*   Lab Work:  Your physician recommends that you HAVE LAB WORK TODAY  If you have labs (blood work) drawn today and your tests are completely normal, you will receive your results only by: [place] (if you have MyChart) OR A paper copy in the mail If you have any lab test that is abnormal or we need to change your treatment, we will call you to review the results.   Testing/Procedures:  Your physician has requested that you have an echocardiogram. Echocardiography is a painless test that uses sound waves to create images of your heart. It provides your doctor with information about the size and shape of your heart and how well your heart's chambers and valves are working. This procedure takes approximately one hour. There are no restrictions for this procedure. [place]   Follow-Up: At [facility], you and your health needs are our priority.  As part of our continuing mission to provide you with exceptional heart care, we have created designated Provider Care Teams.  These Care Teams include your primary Cardiologist (physician) and Advanced Practice Providers (APPs -  Physician Assistants and Nurse Practitioners) who all work together to provide you with the care you need, when you need it.  We recommend signing up for the patient portal called "MyChart".  Sign up information is provided on this After Visit Summary.  MyChart is used to connect with patients for Virtual Visits (Telemedicine).  Patients are able to view lab/test results, encounter notes, upcoming appointments, etc.  Non-urgent messages can be sent to your provider as well.   To learn more about what you can do with MyChart, go to [email or web address].     Your next appointment:   3 month(s)  The format for your next appointment:   In Person  Provider:   Coletta Memos, FNP or Sande Rives, PA-C    Then, Kirk Ruths, MD will plan to see you again in 6 month(s).

## 2021-06-23 LAB — BASIC METABOLIC PANEL WITH GFR
BUN/Creatinine Ratio: 12 (ref 10–24)
BUN: 13 mg/dL (ref 8–27)
CO2: 28 mmol/L (ref 20–29)
Calcium: 10 mg/dL (ref 8.6–10.2)
Chloride: 102 mmol/L (ref 96–106)
Creatinine, Ser: 1.12 mg/dL (ref 0.76–1.27)
Glucose: 53 mg/dL — ABNORMAL LOW (ref 70–99)
Potassium: 4.5 mmol/L (ref 3.5–5.2)
Sodium: 146 mmol/L — ABNORMAL HIGH (ref 134–144)
eGFR: 69 mL/min/{1.73_m2}

## 2021-06-24 DIAGNOSIS — J9610 Chronic respiratory failure, unspecified whether with hypoxia or hypercapnia: Secondary | ICD-10-CM | POA: Diagnosis not present

## 2021-06-29 ENCOUNTER — Encounter: Payer: Self-pay | Admitting: Cardiology

## 2021-06-29 MED ORDER — APIXABAN 5 MG PO TABS: 5.0000 mg | ORAL_TABLET | Freq: Two times a day (BID) | ORAL | 3 refills | Status: DC

## 2021-07-06 ENCOUNTER — Ambulatory Visit (INDEPENDENT_AMBULATORY_CARE_PROVIDER_SITE_OTHER): Payer: Medicare Other

## 2021-07-06 ENCOUNTER — Other Ambulatory Visit: Payer: Self-pay

## 2021-07-06 DIAGNOSIS — E538 Deficiency of other specified B group vitamins: Secondary | ICD-10-CM | POA: Diagnosis not present

## 2021-07-06 MED ORDER — CYANOCOBALAMIN 1000 MCG/ML IJ SOLN
1000.0000 ug | Freq: Once | INTRAMUSCULAR | Status: AC
  Administered 2021-07-06: 10:00:00 1000 ug via INTRAMUSCULAR

## 2021-07-06 MED ORDER — CYANOCOBALAMIN 1000 MCG/ML IJ SOLN: 1000.0000 ug | Freq: Once | INTRAMUSCULAR | Status: DC

## 2021-07-06 NOTE — Progress Notes (Signed)
Pt given B12 w/o any complications. °

## 2021-07-09 ENCOUNTER — Other Ambulatory Visit: Payer: Self-pay

## 2021-07-09 ENCOUNTER — Encounter: Payer: Self-pay | Admitting: Internal Medicine

## 2021-07-09 MED ORDER — METOPROLOL TARTRATE 25 MG PO TABS
25.0000 mg | ORAL_TABLET | Freq: Two times a day (BID) | ORAL | 3 refills | Status: DC
Start: 2021-07-09 — End: 2022-07-12

## 2021-07-09 MED ORDER — GLIPIZIDE ER 10 MG PO TB24
10.0000 mg | ORAL_TABLET | Freq: Every day | ORAL | 3 refills | Status: DC
Start: 2021-07-09 — End: 2022-07-15

## 2021-07-21 ENCOUNTER — Other Ambulatory Visit: Payer: Self-pay | Admitting: *Deleted

## 2021-07-21 MED ORDER — APIXABAN 5 MG PO TABS: 5.0000 mg | ORAL_TABLET | Freq: Two times a day (BID) | ORAL | 1 refills | Status: DC

## 2021-07-21 NOTE — Telephone Encounter (Signed)
Prescription refill request for Eliquis received.  Indication: afib  Last office visit: [place], 06/22/2021 Scr: 1.12, 06/22/2021 Age: 75 yo  Weight: 125.5 kg   [facility]

## 2021-07-22 ENCOUNTER — Ambulatory Visit (HOSPITAL_COMMUNITY): Payer: Medicare Other | Attending: Cardiology

## 2021-07-22 ENCOUNTER — Other Ambulatory Visit: Payer: Self-pay

## 2021-07-22 DIAGNOSIS — I5032 Chronic diastolic (congestive) heart failure: Secondary | ICD-10-CM | POA: Insufficient documentation

## 2021-07-22 LAB — ECHOCARDIOGRAM COMPLETE
Area-P 1/2: 4.29 cm2
S' Lateral: 3.5 cm

## 2021-07-22 MED ORDER — PERFLUTREN LIPID MICROSPHERE
1.0000 mL | INTRAVENOUS | Status: AC | PRN
Start: 2021-07-22 — End: 2021-07-22
  Administered 2021-07-22: 1 mL via INTRAVENOUS

## 2021-07-24 ENCOUNTER — Encounter: Payer: Self-pay | Admitting: Cardiology

## 2021-07-24 ENCOUNTER — Telehealth: Payer: Self-pay | Admitting: *Deleted

## 2021-07-24 DIAGNOSIS — J9610 Chronic respiratory failure, unspecified whether with hypoxia or hypercapnia: Secondary | ICD-10-CM | POA: Diagnosis not present

## 2021-07-24 MED ORDER — TORSEMIDE 20 MG PO TABS: ORAL_TABLET | ORAL | 11 refills | Status: DC

## 2021-07-24 NOTE — Telephone Encounter (Signed)
Pt Torsemide increased at last visit. Pt states that pharmacy states it is to early to refill. Will send in new script for correct dose.

## 2021-08-06 ENCOUNTER — Other Ambulatory Visit: Payer: Self-pay

## 2021-08-06 ENCOUNTER — Ambulatory Visit (INDEPENDENT_AMBULATORY_CARE_PROVIDER_SITE_OTHER): Payer: Medicare Other

## 2021-08-06 DIAGNOSIS — E538 Deficiency of other specified B group vitamins: Secondary | ICD-10-CM | POA: Diagnosis not present

## 2021-08-06 NOTE — Progress Notes (Signed)
B12 given via left deltoid and tolerated well.

## 2021-08-18 DIAGNOSIS — J42 Unspecified chronic bronchitis: Secondary | ICD-10-CM | POA: Diagnosis not present

## 2021-08-18 DIAGNOSIS — G4733 Obstructive sleep apnea (adult) (pediatric): Secondary | ICD-10-CM | POA: Diagnosis not present

## 2021-08-24 DIAGNOSIS — J9610 Chronic respiratory failure, unspecified whether with hypoxia or hypercapnia: Secondary | ICD-10-CM | POA: Diagnosis not present

## 2021-08-26 ENCOUNTER — Encounter: Payer: Self-pay | Admitting: Podiatry

## 2021-08-26 ENCOUNTER — Other Ambulatory Visit: Payer: Self-pay

## 2021-08-26 ENCOUNTER — Ambulatory Visit (INDEPENDENT_AMBULATORY_CARE_PROVIDER_SITE_OTHER): Payer: Medicare Other | Admitting: Podiatry

## 2021-08-26 DIAGNOSIS — M79674 Pain in right toe(s): Secondary | ICD-10-CM

## 2021-08-26 DIAGNOSIS — M79675 Pain in left toe(s): Secondary | ICD-10-CM

## 2021-08-26 DIAGNOSIS — B351 Tinea unguium: Secondary | ICD-10-CM | POA: Diagnosis not present

## 2021-08-26 DIAGNOSIS — E119 Type 2 diabetes mellitus without complications: Secondary | ICD-10-CM

## 2021-08-26 NOTE — Progress Notes (Signed)
°  Subjective:  Patient ID: [name] [name], male    DOB: 1946/05/31,   MRN: [id number]  Chief Complaint  Patient presents with   Foot Problem    Pt is here for [place]    76 y.o. male presents for concern of thickened elongated and painful nails that are difficult to trim. Requesting to have them trimmed today. Denies any burning or tingling in her feet. Patient is diabetic and last A1c was 6.6  . Denies any other pedal complaints. Denies n/v/f/c.   PCP: Cathlean Cower MD   Past Medical History:  Diagnosis Date   Acute right-sided CHF (congestive heart failure) ([place])    a. 01/2013.   Arthritis    "knees and hands; thoracic area of the spine" (01/05/2013)   BRONCHITIS, CHRONIC 01/02/2009   COMMON MIGRAINE    "none since treating for high BP"   Complication of anesthesia    "aspiration pneumonia after hand OR" (01/05/2013)   DIABETES MELLITUS, TYPE II    GOUT    HYPERLIPIDEMIA    HYPERTENSION    Long term (current) use of anticoagulants    Morbid obesity (HCC)    OBESITY HYPOVENTILATION SYNDROME    a. on home O2.   On home oxygen therapy    OSA (obstructive sleep apnea)    Permanent atrial fibrillation ([place])    PROSTATE SPECIFIC ANTIGEN, ELEVATED 06/09/2007   Pulmonary HTN ([place])    a. multifactorial including obstructive sleep apnea, obesity hypoventilation syndrome and possible pulmonary venous hypertension.   SKIN RASH 03/12/2010   Unspecified hearing loss     Objective:  Physical Exam: Vascular: DP/PT pulses 2/4 bilateral. CFT <3 seconds. Normal hair growth on digits. Edema noted to bilateral lower extremities.  Skin. No lacerations or abrasions bilateral feet. Nails 1-5 are thickened discolored and elongated with subungual debris.  Xerosis noted to bilateral ankles  Musculoskeletal: MMT 5/5 bilateral lower extremities in DF, PF, Inversion and Eversion. Deceased ROM in DF of ankle joint.  Neurological: Sensation intact to light touch. Diminished protective sensation.   Assessment:    1. Pain due to onychomycosis of toenails of both feet   2. Non-insulin dependent type 2 diabetes mellitus ([place])      Plan:  Patient was evaluated and treated and all questions answered. -Discussed and educated patient on diabetic foot care, especially with  regards to the vascular, neurological and musculoskeletal systems.  -Stressed the importance of good glycemic control and the detriment of not  controlling glucose levels in relation to the foot. -Discussed supportive shoes at all times and checking feet regularly.  -Mechanically debrided all nails 1-5 bilateral using sterile nail nipper and filed with dremel without incident  -Answered all patient questions -Patient to return  in 3 months for at risk foot care -Patient advised to call the office if any problems or questions arise in the meantime.   [name] [name], DPM

## 2021-08-26 NOTE — Progress Notes (Signed)
HPI:FU permanent AFib and right side CHF. He has failed DCCV in the past. Myoview 7/09: EF 54%, normal perfusion. Holter 09/2010: AFib with mildly decreased rate. Right heart failure felt secondary to pulmonary hypertension which is multifactorial including OSA, OHS and possible pulmonary venous hypertension. [place] 01/08/13 demonstrated elevated R heart pressures (RA 15, RV 44/11, PCWP 21, PA 41/30, CO 4.49, CI 1.8, PA sat 61%). Patient has had some degree of renal insufficiency and hyperkalemia on a combination of ACE inhibitor and spironolactone. Last echo 12/22 showed normal LV function, moderate biatrial enlargement. Since last seen he has chronic dyspnea on exertion unchanged.  No orthopnea or PND.  He does state his pedal edema is worse bilaterally.  He denies chest pain, palpitations, syncope or bleeding.  Current Outpatient Medications  Medication Sig Dispense Refill   allopurinol (ZYLOPRIM) 100 MG tablet TAKE 1 TABLET DAILY 90 tablet 3   apixaban (ELIQUIS) 5 MG TABS tablet Take 1 tablet (5 mg total) by mouth 2 (two) times daily. 180 tablet 1   atorvastatin (LIPITOR) 20 MG tablet TAKE 1 TABLET DAILY 90 tablet 3   cetirizine (ZYRTEC) 10 MG tablet Take 10 mg by mouth at bedtime.     cholecalciferol (VITAMIN D3) 25 MCG (1000 UNIT) tablet Take 2,000 Units by mouth daily.     cyanocobalamin (,VITAMIN B-12,) 1000 MCG/ML injection Inject 1,000 mcg into the muscle.     diltiazem (CARDIZEM CD) 180 MG 24 hr capsule TAKE 1 CAPSULE DAILY (NEED OFFICE VISIT) 90 capsule 3   diltiazem (TIAZAC) 180 MG 24 hr capsule      glipiZIDE (GLUCOTROL XL) 10 MG 24 hr tablet Take 1 tablet (10 mg total) by mouth daily with breakfast. 90 tablet 3   glucose blood (ONE TOUCH ULTRA TEST) test strip Use to check blood sugars once daily Dx E11.9 100 each 2   Lancets MISC Use as directed once daily  E11.9 100 each 12   metFORMIN (GLUCOPHAGE-XR) 500 MG 24 hr tablet TAKE 2 TABLETS IN THE MORNING 180 tablet 3   metoprolol  tartrate (LOPRESSOR) 25 MG tablet Take 1 tablet (25 mg total) by mouth 2 (two) times daily. 180 tablet 3   Misc Natural Products (TART CHERRY ADVANCED) CAPS Take 1 capsule by mouth daily.     OXYGEN Inhale 3-5 L into the lungs daily. 3L Daily  4L Resting  5L Exertion     Potassium Chloride ER 20 MEQ TBCR 20 mg.     potassium chloride SA (KLOR-CON) 20 MEQ tablet      potassium chloride SA (KLOR-CON) 20 MEQ tablet      potassium chloride SA (KLOR-CON) 20 MEQ tablet TAKE 1 TABLET TWICE A DAY 180 tablet 3   torsemide (DEMADEX) 20 MG tablet Take 60 mg ( 3 Tablets ) in the am and take 40 mg ( 2 Tablets) in the evening. 150 tablet 11   Zoster Vaccine Adjuvanted [facility]) injection      tizanidine (ZANAFLEX) 2 MG capsule Take 1 capsule (2 mg total) by mouth 3 (three) times daily. (Patient not taking: Reported on 09/08/2021) 30 capsule 0   Current Facility-Administered Medications  Medication Dose Route Frequency Provider Last Rate Last Admin   cyanocobalamin ((VITAMIN B-12)) injection 1,000 mcg  1,000 mcg Intramuscular Q30 days [name], MD   1,000 mcg at 08/06/21 [id number]     Past Medical History:  Diagnosis Date   Acute right-sided CHF (congestive heart failure) ([place])    a.  01/2013.   Arthritis    "knees and hands; thoracic area of the spine" (01/05/2013)   BRONCHITIS, CHRONIC 01/02/2009   COMMON MIGRAINE    "none since treating for high BP"   Complication of anesthesia    "aspiration pneumonia after hand OR" (01/05/2013)   DIABETES MELLITUS, TYPE II    GOUT    HYPERLIPIDEMIA    HYPERTENSION    Long term (current) use of anticoagulants    Morbid obesity (HCC)    OBESITY HYPOVENTILATION SYNDROME    a. on home O2.   On home oxygen therapy    OSA (obstructive sleep apnea)    Permanent atrial fibrillation ([place])    PROSTATE SPECIFIC ANTIGEN, ELEVATED 06/09/2007   Pulmonary HTN ([place])    a. multifactorial including obstructive sleep apnea, obesity hypoventilation syndrome and possible  pulmonary venous hypertension.   SKIN RASH 03/12/2010   Unspecified hearing loss     Past Surgical History:  Procedure Laterality Date   APPENDECTOMY  1974   CARDIOVERSION  05/21/2008; ~ 06/2008   HERNIA REPAIR     INGUINAL HERNIA REPAIR Right    RIGHT HEART CATHETERIZATION N/A 01/08/2013   Procedure: RIGHT HEART CATH;  Surgeon: Thayer Headings, MD;  Location[facility] CATH LAB;  Service: Cardiovascular;  Laterality: N/A;   TENDON REPAIR Right 2009   "lacerated his tendon and pulley" Dr. Lenon Curt   UMBILICAL HERNIA REPAIR      Social History   Socioeconomic History   Marital status: Widowed    Spouse name: Not on file   Number of children: 2   Years of education: Not on file   Highest education level: Not on file  Occupational History   Occupation: Engineer, agricultural    Employer: [employer]  Tobacco Use   Smoking status: Former    Packs/day: 1.00    Years: 15.00    Pack years: 15.00    Types: Cigarettes    Quit date: 08/09/1978    Years since quitting: 43.1   Smokeless tobacco: Never   Tobacco comments:    01/05/2013 "quit smoking ~ 40 years ago"  Vaping Use   Vaping Use: Never used  Substance and Sexual Activity   Alcohol use: Yes    Alcohol/week: 0.0 standard drinks    Comment: 01/05/2013 "hasn't had a drink since 1980's; never had problem w/it"   Drug use: No   Sexual activity: Not Currently  Other Topics Concern   Not on file  Social History Narrative   Not on file   Social Determinants of Health   Financial Resource Strain: Not on file  Food Insecurity: Not on file  Transportation Needs: Not on file  Physical Activity: Not on file  Stress: Not on file  Social Connections: Not on file  Intimate Partner Violence: Not on file    Family History  Problem Relation Age of Onset   Alzheimer's disease Father    Prostate cancer Father    Cancer Other        Breast Cancer, <50 yo 1st degree relative   Coronary artery disease Other        Male, 1st degree  relative    ROS: no fevers or chills, productive cough, hemoptysis, dysphasia, odynophagia, melena, hematochezia, dysuria, hematuria, rash, seizure activity, orthopnea, PND, claudication. Remaining systems are negative.  Physical Exam: Well-developed obese in no acute distress.  Skin is warm and dry.  HEENT is normal.  Neck is supple.  Chest is clear to auscultation with normal  expansion.  Cardiovascular exam is irregular Abdominal exam nontender or distended. No masses palpated. Extremities show 1+ edema.  Chronic skin changes neuro grossly intact  A/P  1 permanent atrial fibrillation-heart rate is controlled.  Continue Cardizem and metoprolol at present dose.  Continue apixaban.  2 chronic diastolic congestive heart failure-predominant issue is right-sided heart failure from history of sleep apnea and obesity hypoventilation syndrome.  He is somewhat volume overloaded.  Increase Demadex to 60 mg twice daily.  We also discussed the importance of fluid restriction and low-sodium diet.  Check potassium and renal function in 1 week.  He is scheduled to be seen in 2 weeks by APP to reassess volume.  3 hypertension-blood pressure elevated; I have asked him to follow this at home.  We will add additional medications if needed.  4 hyperlipidemia-continue statin.  5 morbid obesity-needs weight loss.  Kirk Ruths, MD

## 2021-09-03 ENCOUNTER — Ambulatory Visit: Payer: Medicare Other

## 2021-09-04 ENCOUNTER — Other Ambulatory Visit: Payer: Self-pay

## 2021-09-04 ENCOUNTER — Ambulatory Visit (INDEPENDENT_AMBULATORY_CARE_PROVIDER_SITE_OTHER): Payer: Medicare Other | Admitting: *Deleted

## 2021-09-04 DIAGNOSIS — E538 Deficiency of other specified B group vitamins: Secondary | ICD-10-CM | POA: Diagnosis not present

## 2021-09-04 MED ORDER — CYANOCOBALAMIN 1000 MCG/ML IJ SOLN
1000.0000 ug | Freq: Once | INTRAMUSCULAR | Status: AC
  Administered 2021-09-04: 1000 ug via INTRAMUSCULAR

## 2021-09-04 NOTE — Progress Notes (Signed)
Pls cosign for B12 inj../lmb  

## 2021-09-06 ENCOUNTER — Encounter: Payer: Self-pay | Admitting: Cardiology

## 2021-09-08 ENCOUNTER — Ambulatory Visit (INDEPENDENT_AMBULATORY_CARE_PROVIDER_SITE_OTHER): Payer: Medicare Other | Admitting: Cardiology

## 2021-09-08 ENCOUNTER — Encounter: Payer: Self-pay | Admitting: Cardiology

## 2021-09-08 ENCOUNTER — Other Ambulatory Visit: Payer: Self-pay

## 2021-09-08 VITALS — BP 150/86 | HR 67 | Ht 68.0 in | Wt 278.6 lb

## 2021-09-08 DIAGNOSIS — I4821 Permanent atrial fibrillation: Secondary | ICD-10-CM | POA: Diagnosis not present

## 2021-09-08 DIAGNOSIS — I10 Essential (primary) hypertension: Secondary | ICD-10-CM

## 2021-09-08 DIAGNOSIS — E7800 Pure hypercholesterolemia, unspecified: Secondary | ICD-10-CM

## 2021-09-08 DIAGNOSIS — I5032 Chronic diastolic (congestive) heart failure: Secondary | ICD-10-CM

## 2021-09-08 MED ORDER — TORSEMIDE 20 MG PO TABS: 60.0000 mg | ORAL_TABLET | Freq: Two times a day (BID) | ORAL | 11 refills | Status: DC

## 2021-09-08 NOTE — Patient Instructions (Signed)
Medication Instructions:   INCREASE TORSEMIDE TO 60 MG TWICE DAILY= 3 OF THE 20 MG TABLETS TWICE DAILY  *If you need a refill on your cardiac medications before your next appointment, please call your pharmacy*   Lab Work:  Your physician recommends that you return for lab work in: [place]  If you have labs (blood work) drawn today and your tests are completely normal, you will receive your results only by: [employer] (if you have MyChart) OR A paper copy in the mail If you have any lab test that is abnormal or we need to change your treatment, we will call you to review the results.    Follow-Up: At [facility], you and your health needs are our priority.  As part of our continuing mission to provide you with exceptional heart care, we have created designated Provider Care Teams.  These Care Teams include your primary Cardiologist (physician) and Advanced Practice Providers (APPs -  Physician Assistants and Nurse Practitioners) who all work together to provide you with the care you need, when you need it.  We recommend signing up for the patient portal called "MyChart".  Sign up information is provided on this After Visit Summary.  MyChart is used to connect with patients for Virtual Visits (Telemedicine).  Patients are able to view lab/test results, encounter notes, upcoming appointments, etc.  Non-urgent messages can be sent to your provider as well.   To learn more about what you can do with MyChart, go to [email or web address].    Your next appointment:   6 month(s)  The format for your next appointment:   In Person  Provider:   Kirk Ruths, MD

## 2021-09-16 DIAGNOSIS — I5032 Chronic diastolic (congestive) heart failure: Secondary | ICD-10-CM | POA: Diagnosis not present

## 2021-09-17 ENCOUNTER — Other Ambulatory Visit: Payer: Self-pay | Admitting: *Deleted

## 2021-09-17 ENCOUNTER — Encounter: Payer: Self-pay | Admitting: *Deleted

## 2021-09-17 DIAGNOSIS — I5032 Chronic diastolic (congestive) heart failure: Secondary | ICD-10-CM

## 2021-09-17 LAB — BASIC METABOLIC PANEL
BUN/Creatinine Ratio: 15 (ref 10–24)
BUN: 18 mg/dL (ref 8–27)
CO2: 27 mmol/L (ref 20–29)
Calcium: 10.1 mg/dL (ref 8.6–10.2)
Chloride: 102 mmol/L (ref 96–106)
Creatinine, Ser: 1.23 mg/dL (ref 0.76–1.27)
Glucose: 142 mg/dL — ABNORMAL HIGH (ref 70–99)
Potassium: 4.9 mmol/L (ref 3.5–5.2)
Sodium: 149 mmol/L — ABNORMAL HIGH (ref 134–144)
eGFR: 61 mL/min/{1.73_m2} (ref >59)

## 2021-09-17 NOTE — Progress Notes (Signed)
bmp 

## 2021-09-17 NOTE — Progress Notes (Signed)
Cardiology Clinic Note   Patient Name: [name] [name] Date of Encounter: 09/21/2021  Primary Care Provider:  [name], MD Primary Cardiologist:  Kirk Ruths, MD  Patient Profile    [name] [name] 76 year old male presents to the clinic today for follow-up evaluation of his atrial fibrillation and essential hypertension.  Past Medical History    Past Medical History:  Diagnosis Date   Acute right-sided CHF (congestive heart failure) ([place])    a. 01/2013.   Arthritis    "knees and hands; thoracic area of the spine" (01/05/2013)   BRONCHITIS, CHRONIC 01/02/2009   COMMON MIGRAINE    "none since treating for high BP"   Complication of anesthesia    "aspiration pneumonia after hand OR" (01/05/2013)   DIABETES MELLITUS, TYPE II    GOUT    HYPERLIPIDEMIA    HYPERTENSION    Long term (current) use of anticoagulants    Morbid obesity (HCC)    OBESITY HYPOVENTILATION SYNDROME    a. on home O2.   On home oxygen therapy    OSA (obstructive sleep apnea)    Permanent atrial fibrillation ([place])    PROSTATE SPECIFIC ANTIGEN, ELEVATED 06/09/2007   Pulmonary HTN ([place])    a. multifactorial including obstructive sleep apnea, obesity hypoventilation syndrome and possible pulmonary venous hypertension.   SKIN RASH 03/12/2010   Unspecified hearing loss    Past Surgical History:  Procedure Laterality Date   APPENDECTOMY  1974   CARDIOVERSION  05/21/2008; ~ 06/2008   HERNIA REPAIR     INGUINAL HERNIA REPAIR Right    RIGHT HEART CATHETERIZATION N/A 01/08/2013   Procedure: RIGHT HEART CATH;  Surgeon: Thayer Headings, MD;  Location: [facility] CATH LAB;  Service: Cardiovascular;  Laterality: N/A;   TENDON REPAIR Right 2009   "lacerated his tendon and pulley" Dr. Lenon Curt   UMBILICAL HERNIA REPAIR      Allergies  Allergies  Allergen Reactions   Codeine     Altered mental status "goofy"   Heparin Rash     Abdominal rash 01/2013    History of Present Illness    [name] [name] has a PMH of chronic diastolic  CHF, atrial fibrillation.  Severe obesity, essential hypertension HLD.  He failed DCCV.  Nuclear stress test 7/9.  Showed an EF of 54%, normal perfusion.  Cardiac event monitor 2/12 showed A-fib with mildly decreased rate.  His right heart failure was felt to be secondary to pulmonary hypertension which was multifactorial OSA, OHS and possible pulmonary venous hypertension.  He had a right heart, 6/14 which showed elevated right heart pressures.  He was noted to have renal insufficiency and hyperkalemia on combination of ACE and spironolactone.  His echocardiogram 12/22 showed normal LV function, moderate by atrial enlargement.  He reported unchanged chronic dyspnea with unchanged exertion.  He denied orthopnea and PND.  He did  have some lower extremity swelling which was noted to be worse bilaterally.  He denied chest pain, palpitations, syncope, bleeding issues.  His Demadex was increased to 60 mg twice daily.  He presents to the clinic today for follow-up evaluation states he feels well.  He feels that his breathing has returned to its baseline.  He reports that he continues to monitor his fluid consumption.  I recommended that he continue to restrict his fluids to 48-64 ounces or less daily.  He reports that he continues to have dry mouth.  We discussed options for using Biotene, sugar-free gum and sugar-free candy.  His dry weight appears to be around 275 -276 pounds.  We reviewed the importance of daily weights and early reporting of weight gain.  He and his daughter expressed understanding.  We reviewed his last clinic visit.  He continues to use 5 L nasal cannula and reports compliance with his CPAP at night.  I will continue his current medication regimen, give a salty 6 diet sheet, and plan follow-up for around 4 months.  He is excited that he is now a great grandfather.  Today he denies chest pain, shortness of breath, lower extremity edema, fatigue, palpitations, melena, hematuria, hemoptysis,  diaphoresis, weakness, presyncope, syncope, orthopnea, and PND.     Home Medications    Prior to Admission medications   Medication Sig Start Date End Date Taking? Authorizing Provider  allopurinol (ZYLOPRIM) 100 MG tablet TAKE 1 TABLET DAILY 12/08/20   [name], MD  apixaban (ELIQUIS) 5 MG TABS tablet Take 1 tablet (5 mg total) by mouth 2 (two) times daily. 07/21/21   Lelon Perla, MD  atorvastatin (LIPITOR) 20 MG tablet TAKE 1 TABLET DAILY 12/08/20   [name], MD  cetirizine (ZYRTEC) 10 MG tablet Take 10 mg by mouth at bedtime.    [provider]  cholecalciferol (VITAMIN D3) 25 MCG (1000 UNIT) tablet Take 2,000 Units by mouth daily.    [provider]  cyanocobalamin (,VITAMIN B-12,) 1000 MCG/ML injection Inject 1,000 mcg into the muscle.    [provider]  diltiazem (CARDIZEM CD) 180 MG 24 hr capsule TAKE 1 CAPSULE DAILY (NEED OFFICE VISIT) 12/08/20   [name], MD  diltiazem [facility]) 180 MG 24 hr capsule  10/03/19   [provider]  glipiZIDE (GLUCOTROL XL) 10 MG 24 hr tablet Take 1 tablet (10 mg total) by mouth daily with breakfast. 07/09/21   [name], MD  glucose blood (ONE TOUCH ULTRA TEST) test strip Use to check blood sugars once daily Dx E11.9 05/13/17   [name], MD  Lancets MISC Use as directed once daily  E11.9 04/19/17   [name], MD  metFORMIN (GLUCOPHAGE-XR) 500 MG 24 hr tablet TAKE 2 TABLETS IN THE MORNING 09/15/20   [name], MD  metoprolol tartrate (LOPRESSOR) 25 MG tablet Take 1 tablet (25 mg total) by mouth 2 (two) times daily. 07/09/21   [name], MD  Misc Natural Products [facility] ADVANCED) CAPS Take 1 capsule by mouth daily.    [provider]  OXYGEN Inhale 3-5 L into the lungs daily. 3L Daily  4L Resting  5L Exertion    [provider]  Potassium Chloride ER 20 MEQ TBCR 20 mg. 10/03/19   [provider]  potassium chloride SA (KLOR-CON) 20 MEQ tablet  09/25/19    [provider]  potassium chloride SA (KLOR-CON) 20 MEQ tablet  09/14/20   [provider]  potassium chloride SA (KLOR-CON) 20 MEQ tablet TAKE 1 TABLET TWICE A DAY 12/08/20   [name], MD  tizanidine (ZANAFLEX) 2 MG capsule Take 1 capsule (2 mg total) by mouth 3 (three) times daily. Patient not taking: Reported on 09/08/2021 06/20/20   Marrian Salvage, FNP  torsemide (DEMADEX) 20 MG tablet Take 3 tablets (60 mg total) by mouth 2 (two) times daily. 09/08/21   Lelon Perla, MD  Zoster Vaccine Adjuvanted [facility]) injection  06/13/19   [provider]    Family History    Family History  Problem  Relation Age of Onset   Alzheimer's disease Father    Prostate cancer Father    Cancer Other        Breast Cancer, <50 yo 1st degree relative   Coronary artery disease Other        Male, 1st degree relative   He indicated that his mother is deceased. He indicated that his father is deceased. He indicated that his brother is deceased. He indicated that his maternal grandmother is deceased. He indicated that his maternal grandfather is deceased. He indicated that his paternal grandmother is deceased. He indicated that his paternal grandfather is deceased. He indicated that the status of his other is unknown.  Social History    Social History   Socioeconomic History   Marital status: Widowed    Spouse name: Not on file   Number of children: 2   Years of education: Not on file   Highest education level: Not on file  Occupational History   Occupation: Engineer, agricultural    Employer: [employer]  Tobacco Use   Smoking status: Former    Packs/day: 1.00    Years: 15.00    Pack years: 15.00    Types: Cigarettes    Quit date: 08/09/1978    Years since quitting: 43.1   Smokeless tobacco: Never   Tobacco comments:    01/05/2013 "quit smoking ~ 40 years ago"  Vaping Use   Vaping Use: Never used  Substance and Sexual Activity   Alcohol use: Yes     Alcohol/week: 0.0 standard drinks    Comment: 01/05/2013 "hasn't had a drink since 1980's; never had problem w/it"   Drug use: No   Sexual activity: Not Currently  Other Topics Concern   Not on file  Social History Narrative   Not on file   Social Determinants of Health   Financial Resource Strain: Not on file  Food Insecurity: Not on file  Transportation Needs: Not on file  Physical Activity: Not on file  Stress: Not on file  Social Connections: Not on file  Intimate Partner Violence: Not on file     Review of Systems    General:  No chills, fever, night sweats or weight changes.  Cardiovascular:  No chest pain, dyspnea on exertion, edema, orthopnea, palpitations, paroxysmal nocturnal dyspnea. Dermatological: No rash, lesions/masses Respiratory: No cough, dyspnea Urologic: No hematuria, dysuria Abdominal:   No nausea, vomiting, diarrhea, bright red blood per rectum, melena, or hematemesis Neurologic:  No visual changes, wkns, changes in mental status. All other systems reviewed and are otherwise negative except as noted above.  Physical Exam    VS:  BP 138/64    Pulse 88    Ht 5\' 8"  (1.727 m)    Wt 276 lb 3.2 oz (125.3 kg)    SpO2 98%    BMI 42.00 kg/m  , BMI Body mass index is 42 kg/m. GEN: Well nourished, well developed, in no acute distress. HEENT: normal. Neck: Supple, no JVD, carotid bruits, or masses. Cardiac: RRR, no murmurs, rubs, or gallops. No clubbing, cyanosis, edema.  Radials/DP/PT 2+ and equal bilaterally.  Respiratory:  Respirations regular and unlabored, clear to auscultation bilaterally. GI: Soft, nontender, nondistended, BS + x 4. MS: no deformity or atrophy. Skin: warm and dry, no rash. Neuro:  Strength and sensation are intact. Psych: Normal affect.  Accessory Clinical Findings    Recent Labs: 04/16/2021: ALT 11; Hemoglobin 14.8; Platelets 274.0; TSH 1.80 09/16/2021: BUN 18; Creatinine, Ser 1.23; Potassium 4.9; Sodium  149   Recent Lipid Panel     Component Value Date/Time   CHOL 110 04/16/2021 1138   CHOL 114 09/13/2019 1047   TRIG 167.0 (H) 04/16/2021 1138   HDL 38.60 (L) 04/16/2021 1138   HDL 44 09/13/2019 1047   CHOLHDL 3 04/16/2021 1138   VLDL 33.4 04/16/2021 1138   LDLCALC 38 04/16/2021 1138   LDLCALC 56 04/11/2020 1409    ECG personally reviewed by me today-none today.  Echocardiogram 07/22/2021  IMPRESSIONS     1. Left ventricular ejection fraction, by estimation, is 55 to 60%. The  left ventricle has normal function. The left ventricle has no regional  wall motion abnormalities. Left ventricular diastolic parameters are  indeterminate.   2. Right ventricular systolic function is normal. The right ventricular  size is normal. Tricuspid regurgitation signal is inadequate for assessing  PA pressure.   3. Left atrial size was moderately dilated.   4. Right atrial size was moderately dilated.   5. The mitral valve is normal in structure. No evidence of mitral valve  regurgitation. No evidence of mitral stenosis.   6. The aortic valve is tricuspid. Aortic valve regurgitation is not  visualized. No aortic stenosis is present.   7. The inferior vena cava is dilated in size with >50% respiratory  variability, suggesting right atrial pressure of 8 mmHg.   8. The patient was in atrial fibrillation.  Assessment & Plan   1.   Chronic diastolic CHF.  Weight stable.  Breathing has returned to baseline.  Continues with 5 L nasal cannula.  No increased DOE .  Responded well to increased Demadex dosing. Continue torsemide, potassium, Heart healthy low-sodium diet-salty 6 given Increase physical activity as tolerated Daily weights-contact office with a weight increase of 2 pounds overnight or 5 pounds in 1 week  Atrial fibrillation-heart rate today 88.  Cardiac unaware.  Reports compliance with apixaban and denies bleeding issues. Continue apixaban, diltiazem Heart healthy low-sodium diet-salty 6 given Increase physical  activity as tolerated  Essential hypertension-BP today 138/64.  Well-controlled at home. Continue diltiazem Heart healthy low-sodium diet-salty 6 given Increase physical activity as tolerated  Hyperlipidemia-04/16/2021: Cholesterol 110; HDL 38.60; LDL Cholesterol 38; Triglycerides 167.0; VLDL 33.4 Continue atorvastatin Heart healthy low-sodium high-fiber diet  Obesity-weight today 276lbs.  Calorie restricted diet Continue weight loss Increase physical activity as tolerated  Disposition: Follow-up with Dr. [name] or me in 3-4 months.  Jossie Ng. Rever Pichette NP-C    09/21/2021, 9:21 AM [address] [address] Office [phone] Fax [phone]  Notice: This dictation was prepared with Dragon dictation along with smaller phrase technology. Any transcriptional errors that result from this process are unintentional and may not be corrected upon review.  I spent 14 minutes examining this patient, reviewing medications, and using patient centered shared decision making involving her cardiac care.  Prior to her visit I spent greater than 20 minutes reviewing her past medical history,  medications, and prior cardiac tests.

## 2021-09-21 ENCOUNTER — Encounter: Payer: Self-pay | Admitting: General Practice

## 2021-09-21 ENCOUNTER — Other Ambulatory Visit: Payer: Self-pay

## 2021-09-21 ENCOUNTER — Ambulatory Visit (INDEPENDENT_AMBULATORY_CARE_PROVIDER_SITE_OTHER): Payer: Medicare Other | Admitting: General Practice

## 2021-09-21 VITALS — BP 138/64 | HR 88 | Ht 68.0 in | Wt 276.2 lb

## 2021-09-21 DIAGNOSIS — I4821 Permanent atrial fibrillation: Secondary | ICD-10-CM | POA: Diagnosis not present

## 2021-09-21 DIAGNOSIS — I5032 Chronic diastolic (congestive) heart failure: Secondary | ICD-10-CM | POA: Diagnosis not present

## 2021-09-21 DIAGNOSIS — I10 Essential (primary) hypertension: Secondary | ICD-10-CM

## 2021-09-21 DIAGNOSIS — E7800 Pure hypercholesterolemia, unspecified: Secondary | ICD-10-CM

## 2021-09-21 NOTE — Patient Instructions (Signed)
Medication Instructions:  The current medical regimen is effective;  continue present plan and medications as directed. Please refer to the Current Medication list given to you today.   *If you need a refill on your cardiac medications before your next appointment, please call your pharmacy*  Lab Work:   Testing/Procedures:  NONE    NONE  Special Instructions PLEASE READ AND FOLLOW SALTY 6-ATTACHED-1,800mg  daily PLEASE TAKE AND LOG YOUR WEIGHT DAILY  FOR YOUR DRY MOUTH YOU MAY USE BIOTENE MOUTH RINSE, SUGAR FREE GUM, CANDY OR MINTS  Follow-Up: Your next appointment:  3-4 month(s) In Person with Kirk Ruths, MD   At [facility], you and your health needs are our priority.  As part of our continuing mission to provide you with exceptional heart care, we have created designated Provider Care Teams.  These Care Teams include your primary Cardiologist (physician) and Advanced Practice Providers (APPs -  Physician Assistants and Nurse Practitioners) who all work together to provide you with the care you need, when you need it.            6 SALTY THINGS TO AVOID     1,800MG  DAILY

## 2021-09-24 DIAGNOSIS — J9610 Chronic respiratory failure, unspecified whether with hypoxia or hypercapnia: Secondary | ICD-10-CM | POA: Diagnosis not present

## 2021-10-04 ENCOUNTER — Other Ambulatory Visit: Payer: Self-pay | Admitting: Internal Medicine

## 2021-10-04 NOTE — Telephone Encounter (Signed)
Please refill as per office routine med refill policy (all routine meds to be refilled for 3 mo or monthly (per pt preference) up to one year from last visit, then month to month grace period for 3 mo, then further med refills will have to be denied) ? ?

## 2021-10-08 ENCOUNTER — Other Ambulatory Visit: Payer: Self-pay

## 2021-10-08 ENCOUNTER — Ambulatory Visit (INDEPENDENT_AMBULATORY_CARE_PROVIDER_SITE_OTHER): Payer: Medicare Other | Admitting: *Deleted

## 2021-10-08 DIAGNOSIS — E538 Deficiency of other specified B group vitamins: Secondary | ICD-10-CM | POA: Diagnosis not present

## 2021-10-08 DIAGNOSIS — I5032 Chronic diastolic (congestive) heart failure: Secondary | ICD-10-CM | POA: Diagnosis not present

## 2021-10-08 LAB — BASIC METABOLIC PANEL
BUN/Creatinine Ratio: 17 (ref 10–24)
BUN: 21 mg/dL (ref 8–27)
CO2: 28 mmol/L (ref 20–29)
Calcium: 9.8 mg/dL (ref 8.6–10.2)
Chloride: 101 mmol/L (ref 96–106)
Creatinine, Ser: 1.27 mg/dL (ref 0.76–1.27)
Glucose: 126 mg/dL — ABNORMAL HIGH (ref 70–99)
Potassium: 4.2 mmol/L (ref 3.5–5.2)
Sodium: 143 mmol/L (ref 134–144)
eGFR: 59 mL/min/{1.73_m2} — ABNORMAL LOW (ref >59)

## 2021-10-08 MED ORDER — CYANOCOBALAMIN 1000 MCG/ML IJ SOLN
1000.0000 ug | Freq: Once | INTRAMUSCULAR | Status: AC
  Administered 2021-10-08: 1000 ug via INTRAMUSCULAR

## 2021-10-08 NOTE — Progress Notes (Signed)
Patient here for b12 injection. Given in left deltoid. Patient tolerated well  ? ? ?Please co sign ?

## 2021-10-14 NOTE — Progress Notes (Signed)
? ?I, Wendy Poet, LAT, ATC, am serving as scribe for Dr. Lynne Leader. ? ?[name] [name] is a 76 y.o. male who presents to [place] [facility] today for f/u of chronic R shoulder pain.  He was last seen by Dr. Tamala Julian on 12/25/20 and had a R GHJ and ACJ steroid injection.  Today, pt reports that his R shoulder pain increased about 2 weeks ago.  He has some radiating pain into his R upper arm but denies any mechanical symptoms.  He is interested in having more injections today.  ? ?Diagnostic testing: R shoulder XR- 12/25/20 ? ? ?Pertinent review of systems: No fevers or chills ? ?Relevant historical information: Diabetes.  Currently reasonably well controlled with A1c 6.6 when last checked in September.  Oxygen dependent lung disease. ? ? ?Exam:  ?BP 140/78 (BP Location: Left Arm, Patient Position: Sitting, Cuff Size: Large)   Pulse 68   Ht '5\' 8"'$  (1.727 m)   Wt 277 lb 6.4 oz (125.8 kg)   SpO2 94%   BMI 42.18 kg/m?  ?General: Well Developed, well nourished, and in no acute distress.  ? ?MSK: Right shoulder: Normal. ?Tender palpation AC joint. ?Decreased shoulder range of motion. ? ? ? ?Lab and Radiology Results ? ?Procedure: Real-time Ultrasound Guided Injection of right shoulder glenohumeral joint posterior approach ?Device: Philips Affiniti 50G ?Images permanently stored and available for review in PACS ?Verbal informed consent obtained.  Discussed risks and benefits of procedure. Warned about infection bleeding damage to structures skin hypopigmentation and fat atrophy among others. ?Patient expresses understanding and agreement ?Time-out conducted.   ?Noted no overlying erythema, induration, or other signs of local infection.   ?Skin prepped in a sterile fashion.   ?Local anesthesia: Topical Ethyl chloride.   ?With sterile technique and under real time ultrasound guidance: 40 mg of Kenalog and 2 mL of Marcaine injected into glenohumeral joint. Fluid seen entering the joint capsule.    ?Completed without difficulty   ?Pain immediately resolved suggesting accurate placement of the medication.   ?Advised to call if fevers/chills, erythema, induration, drainage, or persistent bleeding.   ?Images permanently stored and available for review in the ultrasound unit.  ?Impression: Technically successful ultrasound guided injection. ? ? ? ?Procedure: Real-time Ultrasound Guided Injection of right shoulder AC joint superior approach ?Device: Philips Affiniti 50G ?Images permanently stored and available for review in PACS ?Verbal informed consent obtained.  Discussed risks and benefits of procedure. Warned about infection bleeding damage to structures skin hypopigmentation and fat atrophy among others. ?Patient expresses understanding and agreement ?Time-out conducted.   ?Noted no overlying erythema, induration, or other signs of local infection.   ?Skin prepped in a sterile fashion.   ?Local anesthesia: Topical Ethyl chloride.   ?With sterile technique and under real time ultrasound guidance: 20 mg of Kenalog and 26m of Marcaine injected into AC joint. Fluid seen entering the joint capsule.   ?Completed without difficulty   ?Pain immediately resolved suggesting accurate placement of the medication.   ?Advised to call if fevers/chills, erythema, induration, drainage, or persistent bleeding.   ?Images permanently stored and available for review in the ultrasound unit.  ?Impression: Technically successful ultrasound guided injection. ? ? ? ?  ?EXAM: ?RIGHT SHOULDER - 2+ VIEW ?  ?COMPARISON:  None. ?  ?FINDINGS: ?No fracture or dislocation. Moderate degenerative change of the ?glenohumeral and acromioclavicular joints with joint space loss, ?subchondral sclerosis and osteophytosis. There is minimal ?enthesopathic change involving the undersurface of the acromion.  No ?evidence of calcific tendinitis. Limited visualization of the ?adjacent thorax is normal. Regional soft tissues appear normal. ?   ?IMPRESSION: ?1. Moderate degenerative change of the right glenohumeral and ?acromioclavicular joints. ?2. Enthesopathic change involving the undersurface of the acromion ?as could be seen in the setting of rotator cuff pathology. ?  ?  ?Electronically Signed ?  By: Sandi Mariscal M.D. ?  On: 12/26/2020 16:51 ?I, Lynne Leader, personally (independently) visualized and performed the interpretation of the images attached in this note. ? ? ? ?Assessment and Plan: ?76 y.o. male with right shoulder pain.  This is an acute exacerbation of his chronic pain.  Pain thought to be due to DJD.  ?He did well with a steroid injection to the glenohumeral joint and AC joint in May with Dr. Tamala Julian.  We will repeat the exact same injection today and recheck back as needed. ? ? ?PDMP not reviewed this encounter. ?Orders Placed This Encounter  ?Procedures  ? [place] LIMITED JOINT SPACE STRUCTURES UP RIGHT(NO LINKED CHARGES)  ?  Order Specific Question:   Reason for Exam (SYMPTOM  OR DIAGNOSIS REQUIRED)  ?  Answer:   R shoulder pain  ?  Order Specific Question:   Preferred imaging location?  ?  Answer:   [place]  ? ?No orders of the defined types were placed in this encounter. ? ? ? ?Discussed warning signs or symptoms. Please see discharge instructions. Patient expresses understanding. ? ? ?The above documentation has been reviewed and is accurate and complete Lynne Leader, M.D. ? ? ?

## 2021-10-15 ENCOUNTER — Ambulatory Visit (INDEPENDENT_AMBULATORY_CARE_PROVIDER_SITE_OTHER): Payer: Medicare Other | Admitting: Internal Medicine

## 2021-10-15 ENCOUNTER — Ambulatory Visit: Payer: Self-pay

## 2021-10-15 ENCOUNTER — Encounter: Payer: Self-pay | Admitting: Family Medicine

## 2021-10-15 ENCOUNTER — Ambulatory Visit (INDEPENDENT_AMBULATORY_CARE_PROVIDER_SITE_OTHER): Payer: Medicare Other | Admitting: Family Medicine

## 2021-10-15 ENCOUNTER — Encounter: Payer: Self-pay | Admitting: Internal Medicine

## 2021-10-15 ENCOUNTER — Other Ambulatory Visit: Payer: Self-pay

## 2021-10-15 VITALS — BP 140/72 | HR 72 | Temp 98.2°F | Ht 68.0 in | Wt 276.0 lb

## 2021-10-15 VITALS — BP 140/78 | HR 68 | Ht 68.0 in | Wt 277.4 lb

## 2021-10-15 DIAGNOSIS — R350 Frequency of micturition: Secondary | ICD-10-CM | POA: Diagnosis not present

## 2021-10-15 DIAGNOSIS — E559 Vitamin D deficiency, unspecified: Secondary | ICD-10-CM | POA: Diagnosis not present

## 2021-10-15 DIAGNOSIS — G8929 Other chronic pain: Secondary | ICD-10-CM

## 2021-10-15 DIAGNOSIS — M19011 Primary osteoarthritis, right shoulder: Secondary | ICD-10-CM

## 2021-10-15 DIAGNOSIS — Z0001 Encounter for general adult medical examination with abnormal findings: Secondary | ICD-10-CM | POA: Diagnosis not present

## 2021-10-15 DIAGNOSIS — E119 Type 2 diabetes mellitus without complications: Secondary | ICD-10-CM

## 2021-10-15 DIAGNOSIS — E7800 Pure hypercholesterolemia, unspecified: Secondary | ICD-10-CM

## 2021-10-15 DIAGNOSIS — M25511 Pain in right shoulder: Secondary | ICD-10-CM

## 2021-10-15 DIAGNOSIS — E538 Deficiency of other specified B group vitamins: Secondary | ICD-10-CM | POA: Diagnosis not present

## 2021-10-15 LAB — LIPID PANEL
Cholesterol: 109 mg/dL (ref 0–200)
HDL: 40.5 mg/dL (ref >39.00)
LDL Cholesterol: 38 mg/dL (ref 0–99)
NonHDL: 68.23
Total CHOL/HDL Ratio: 3
Triglycerides: 152 mg/dL — ABNORMAL HIGH (ref 0.0–149.0)
VLDL: 30.4 mg/dL (ref 0.0–40.0)

## 2021-10-15 LAB — CBC WITH DIFFERENTIAL/PLATELET
Basophils Absolute: 0.1 10*3/uL (ref 0.0–0.1)
Basophils Relative: 0.9 % (ref 0.0–3.0)
Eosinophils Absolute: 0.2 10*3/uL (ref 0.0–0.7)
Eosinophils Relative: 2.2 % (ref 0.0–5.0)
HCT: 45.6 % (ref 39.0–52.0)
Hemoglobin: 14.9 g/dL (ref 13.0–17.0)
Lymphocytes Relative: 21.2 % (ref 12.0–46.0)
Lymphs Abs: 2 10*3/uL (ref 0.7–4.0)
MCHC: 32.7 g/dL (ref 30.0–36.0)
MCV: 87.2 fl (ref 78.0–100.0)
Monocytes Absolute: 0.5 10*3/uL (ref 0.1–1.0)
Monocytes Relative: 5.5 % (ref 3.0–12.0)
Neutro Abs: 6.5 10*3/uL (ref 1.4–7.7)
Neutrophils Relative %: 70.2 % (ref 43.0–77.0)
Platelets: 240 10*3/uL (ref 150.0–400.0)
RBC: 5.23 Mil/uL (ref 4.22–5.81)
RDW: 16.9 % — ABNORMAL HIGH (ref 11.5–15.5)
WBC: 9.3 10*3/uL (ref 4.0–10.5)

## 2021-10-15 LAB — MICROALBUMIN / CREATININE URINE RATIO
Creatinine,U: 19.4 mg/dL
Microalb Creat Ratio: 50.1 mg/g — ABNORMAL HIGH (ref 0.0–30.0)
Microalb, Ur: 9.7 mg/dL — ABNORMAL HIGH (ref 0.0–1.9)

## 2021-10-15 LAB — BASIC METABOLIC PANEL
BUN: 19 mg/dL (ref 6–23)
CO2: 35 mEq/L — ABNORMAL HIGH (ref 19–32)
Calcium: 9.8 mg/dL (ref 8.4–10.5)
Chloride: 101 mEq/L (ref 96–112)
Creatinine, Ser: 1.14 mg/dL (ref 0.40–1.50)
GFR: 62.76 mL/min (ref >60.00)
Glucose, Bld: 73 mg/dL (ref 70–99)
Potassium: 4.3 mEq/L (ref 3.5–5.1)
Sodium: 144 mEq/L (ref 135–145)

## 2021-10-15 LAB — TSH: TSH: 1.2 u[IU]/mL (ref 0.35–5.50)

## 2021-10-15 LAB — URINALYSIS, ROUTINE W REFLEX MICROSCOPIC
Bilirubin Urine: NEGATIVE
Hgb urine dipstick: NEGATIVE
Ketones, ur: NEGATIVE
Leukocytes,Ua: NEGATIVE
Nitrite: NEGATIVE
RBC / HPF: NONE SEEN (ref 0–?)
Specific Gravity, Urine: 1.01 (ref 1.000–1.030)
Total Protein, Urine: NEGATIVE
Urine Glucose: NEGATIVE
Urobilinogen, UA: 0.2 (ref 0.0–1.0)
pH: 7.5 (ref 5.0–8.0)

## 2021-10-15 LAB — VITAMIN D 25 HYDROXY (VIT D DEFICIENCY, FRACTURES): VITD: 53.1 ng/mL (ref 30.00–100.00)

## 2021-10-15 LAB — HEPATIC FUNCTION PANEL
ALT: 14 U/L (ref 0–53)
AST: 15 U/L (ref 0–37)
Albumin: 4 g/dL (ref 3.5–5.2)
Alkaline Phosphatase: 80 U/L (ref 39–117)
Bilirubin, Direct: 0.3 mg/dL (ref 0.0–0.3)
Total Bilirubin: 1.5 mg/dL — ABNORMAL HIGH (ref 0.2–1.2)
Total Protein: 7 g/dL (ref 6.0–8.3)

## 2021-10-15 LAB — VITAMIN B12: Vitamin B-12: 765 pg/mL (ref 211–911)

## 2021-10-15 LAB — PSA: PSA: 1.62 ng/mL (ref 0.10–4.00)

## 2021-10-15 LAB — HEMOGLOBIN A1C: Hgb A1c MFr Bld: 6.4 % (ref 4.6–6.5)

## 2021-10-15 NOTE — Patient Instructions (Signed)
Nice to see you today. ? ?You had 2 shoulder injections.  Call or go to the ER if you develop a large red swollen joint with extreme pain or oozing puss.  ? ?Follow-up: as needed ?

## 2021-10-15 NOTE — Patient Instructions (Addendum)
Please remember to call for your yearly eye exam ? ?Please call if you change your mind about trying vesicare 5 mg for the bladder ? ?Please continue all other medications as before, and refills have been done if requested. ? ?Please have the pharmacy call with any other refills you may need. ? ?Please continue your efforts at being more active, low cholesterol diet, and weight control. ? ?You are otherwise up to date with prevention measures today. ? ?Please keep your appointments with your specialists as you may have planned ? ?Please go to the LAB at the blood drawing area for the tests to be done ? ?You will be contacted by phone if any changes need to be made immediately.  Otherwise, you will receive a letter about your results with an explanation, but please check with MyChart first. ? ?Please remember to sign up for MyChart if you have not done so, as this will be important to you in the future with finding out test results, communicating by private email, and scheduling acute appointments online when needed. ? ?Please make an Appointment to return in 6 months, or sooner if needed ?

## 2021-10-15 NOTE — Progress Notes (Signed)
Patient ID: [name] [name], male   DOB: [birth year], 76 y.o.   MRN: [id number]         Chief Complaint:: wellness exam and Follow-up  Low vit d, dm, hld, urinary frequency       HPI:  [name] [name] is a 76 y.o. male here for wellness exam; plans to call for eye exam soon, declines covid booster, shingrix, and colonoscopy, o/w up to date                        Also not taking vit d, Pt denies chest pain, increased sob or doe, wheezing, orthopnea, PND, increased LE swelling, palpitations, dizziness or syncope.   Pt denies polydipsia, polyuria, or new focal neuro s/s.    Pt denies fever, wt loss, night sweats, loss of appetite, or other constitutional symptoms  Denies urinary symptoms such as dysuria, urgency, flank pain, hematuria or n/v, fever, chills, but does have urinary frequency ongoing for several months.   Wt Readings from Last 3 Encounters:  10/15/21 276 lb (125.2 kg)  10/15/21 277 lb 6.4 oz (125.8 kg)  09/21/21 276 lb 3.2 oz (125.3 kg)   BP Readings from Last 3 Encounters:  10/15/21 140/72  10/15/21 140/78  09/21/21 138/64   Immunization History  Administered Date(s) Administered   Fluad Quad(high Dose 65+) 05/10/2019, 06/06/2020, 04/16/2021   H1N1 07/15/2008   Influenza Split 05/17/2011, 06/09/2012   Influenza Whole 05/09/2008, 06/24/2009, 06/26/2010   Influenza, High Dose Seasonal PF 04/15/2015, 04/19/2017, 04/25/2018   Influenza,inj,Quad PF,6+ Mos 04/05/2013, 04/11/2014, 04/13/2016   PFIZER(Purple Top)SARS-COV-2 Vaccination 10/07/2019, 10/31/2019, 08/30/2020   Pneumococcal Conjugate-13 06/01/2013, 10/09/2013   Pneumococcal Polysaccharide-23 02/07/2008, 10/19/2017   Td 02/07/2008   Tdap 04/25/2018   Zoster Recombinat (Shingrix) 04/06/2019   Zoster, Live 07/02/2008   Health Maintenance Due  Topic Date Due   OPHTHALMOLOGY EXAM  11/08/2018      Past Medical History:  Diagnosis Date   Acute right-sided CHF (congestive heart failure) ([place])    a. 01/2013.   Arthritis    "knees  and hands; thoracic area of the spine" (01/05/2013)   BRONCHITIS, CHRONIC 01/02/2009   COMMON MIGRAINE    "none since treating for high BP"   Complication of anesthesia    "aspiration pneumonia after hand OR" (01/05/2013)   DIABETES MELLITUS, TYPE II    GOUT    HYPERLIPIDEMIA    HYPERTENSION    Long term (current) use of anticoagulants    Morbid obesity (HCC)    OBESITY HYPOVENTILATION SYNDROME    a. on home O2.   On home oxygen therapy    OSA (obstructive sleep apnea)    Permanent atrial fibrillation ([place])    PROSTATE SPECIFIC ANTIGEN, ELEVATED 06/09/2007   Pulmonary HTN ([place])    a. multifactorial including obstructive sleep apnea, obesity hypoventilation syndrome and possible pulmonary venous hypertension.   SKIN RASH 03/12/2010   Unspecified hearing loss    Past Surgical History:  Procedure Laterality Date   APPENDECTOMY  1974   CARDIOVERSION  05/21/2008; ~ 06/2008   HERNIA REPAIR     INGUINAL HERNIA REPAIR Right    RIGHT HEART CATHETERIZATION N/A 01/08/2013   Procedure: RIGHT HEART CATH;  Surgeon: Thayer Headings, MD;  Location:  [facility] CATH LAB;  Service: Cardiovascular;  Laterality: N/A;   TENDON REPAIR Right 2009   "lacerated his tendon and pulley" Dr. Lenon Curt   UMBILICAL HERNIA REPAIR      reports that he quit smoking  about 43 years ago. His smoking use included cigarettes. He has a 15.00 pack-year smoking history. He has never used smokeless tobacco. He reports current alcohol use. He reports that he does not use drugs. family history includes Alzheimer's disease in his father; Cancer in an other family member; Coronary artery disease in an other family member; Prostate cancer in his father. Allergies  Allergen Reactions   Codeine     Altered mental status "goofy"   Heparin Rash     Abdominal rash 01/2013   Current Outpatient Medications on File Prior to Visit  Medication Sig Dispense Refill   allopurinol (ZYLOPRIM) 100 MG tablet TAKE 1 TABLET DAILY 90 tablet 3   apixaban  (ELIQUIS) 5 MG TABS tablet Take 1 tablet (5 mg total) by mouth 2 (two) times daily. 180 tablet 1   atorvastatin (LIPITOR) 20 MG tablet TAKE 1 TABLET DAILY 90 tablet 3   cetirizine (ZYRTEC) 10 MG tablet Take 10 mg by mouth at bedtime.     cholecalciferol (VITAMIN D3) 25 MCG (1000 UNIT) tablet Take 2,000 Units by mouth daily.     cyanocobalamin (,VITAMIN B-12,) 1000 MCG/ML injection Inject 1,000 mcg into the muscle.     diltiazem (CARDIZEM CD) 180 MG 24 hr capsule TAKE 1 CAPSULE DAILY (NEED OFFICE VISIT) 90 capsule 3   diltiazem (TIAZAC) 180 MG 24 hr capsule      glipiZIDE (GLUCOTROL XL) 10 MG 24 hr tablet Take 1 tablet (10 mg total) by mouth daily with breakfast. 90 tablet 3   glucose blood (ONE TOUCH ULTRA TEST) test strip Use to check blood sugars once daily Dx E11.9 100 each 2   Lancets MISC Use as directed once daily  E11.9 100 each 12   metFORMIN (GLUCOPHAGE-XR) 500 MG 24 hr tablet TAKE 2 TABLETS IN THE MORNING 180 tablet 0   metoprolol tartrate (LOPRESSOR) 25 MG tablet Take 1 tablet (25 mg total) by mouth 2 (two) times daily. 180 tablet 3   Misc Natural Products (TART CHERRY ADVANCED) CAPS Take 1 capsule by mouth daily.     OXYGEN Inhale 3-5 L into the lungs daily. 3L Daily  4L Resting  5L Exertion     potassium chloride SA (KLOR-CON) 20 MEQ tablet      tizanidine (ZANAFLEX) 2 MG capsule Take 1 capsule (2 mg total) by mouth 3 (three) times daily. 30 capsule 0   torsemide (DEMADEX) 20 MG tablet Take 3 tablets (60 mg total) by mouth 2 (two) times daily. 180 tablet 11   Zoster Vaccine Adjuvanted [facility]) injection      Current Facility-Administered Medications on File Prior to Visit  Medication Dose Route Frequency Provider Last Rate Last Admin   cyanocobalamin ((VITAMIN B-12)) injection 1,000 mcg  1,000 mcg Intramuscular Q30 days [name], MD   1,000 mcg at 08/06/21 0947        ROS:  All others reviewed and negative.  Objective        PE:  BP 140/72 (BP Location: Right Arm,  Patient Position: Sitting, Cuff Size: Large)    Pulse 72    Temp 98.2 F (36.8 C) (Oral)    Ht '5\' 8"'$  (1.727 m)    Wt 276 lb (125.2 kg)    SpO2 99%    BMI 41.97 kg/m                 Constitutional: Pt appears in NAD  HENT: Head: NCAT.                Right Ear: External ear normal.                 Left Ear: External ear normal.                Eyes: . Pupils are equal, round, and reactive to light. Conjunctivae and EOM are normal               Nose: without d/c or deformity               Neck: Neck supple. Gross normal ROM               Cardiovascular: Normal rate and regular rhythm.                 Pulmonary/Chest: Effort normal and breath sounds without rales or wheezing.                Abd:  Soft, NT, ND, + BS, no organomegaly               Neurological: Pt is alert. At baseline orientation, motor grossly intact               Skin: Skin is warm. No rashes, no other new lesions, LE edema - chronic 1+ bilat edema               Psychiatric: Pt behavior is normal without agitation   Micro: none  Cardiac tracings I have personally interpreted today:  none  Pertinent Radiological findings (summarize): none   Lab Results  Component Value Date   WBC 9.3 10/15/2021   HGB 14.9 10/15/2021   HCT 45.6 10/15/2021   PLT 240.0 10/15/2021   GLUCOSE 73 10/15/2021   CHOL 109 10/15/2021   TRIG 152.0 (H) 10/15/2021   HDL 40.50 10/15/2021   LDLCALC 38 10/15/2021   ALT 14 10/15/2021   AST 15 10/15/2021   NA 144 10/15/2021   K 4.3 10/15/2021   CL 101 10/15/2021   CREATININE 1.14 10/15/2021   BUN 19 10/15/2021   CO2 35 (H) 10/15/2021   TSH 1.20 10/15/2021   PSA 1.62 10/15/2021   INR 1.25 01/09/2013   HGBA1C 6.4 10/15/2021   MICROALBUR 9.7 (H) 10/15/2021   Assessment/Plan:  Ram D Inclan is a 76 y.o. White or Caucasian [1] male with  has a past medical history of Acute right-sided CHF (congestive heart failure) ([place]), Arthritis, BRONCHITIS, CHRONIC (01/02/2009), COMMON MIGRAINE,  Complication of anesthesia, DIABETES MELLITUS, TYPE II, GOUT, HYPERLIPIDEMIA, HYPERTENSION, Long term (current) use of anticoagulants, Morbid obesity ([place]), OBESITY HYPOVENTILATION SYNDROME, On home oxygen therapy, OSA (obstructive sleep apnea), Permanent atrial fibrillation ([place]), PROSTATE SPECIFIC ANTIGEN, ELEVATED (06/09/2007), Pulmonary HTN ([place]), SKIN RASH (03/12/2010), and Unspecified hearing loss.  Vitamin D deficiency Last vitamin D Lab Results  Component Value Date   VD25OH 29.08 (L) 04/16/2021   Low, to start oral replacement   Encounter for well adult exam with abnormal findings Age and sex appropriate education and counseling updated with regular exercise and diet Referrals for preventative services - pt to call for eye exam, declines colonoscopy  Immunizations addressed - declines covid booster, shingrix Smoking counseling  - none needed Evidence for depression or other mood disorder - none significant Most recent labs reviewed. I have personally reviewed and have noted: 1) the patient's medical and social history 2) The patient's current medications and supplements 3) The  patient's height, weight, and BMI have been recorded in the chart   Non-insulin dependent type 2 diabetes mellitus ([place]) Lab Results  Component Value Date   HGBA1C 6.4 10/15/2021   Stable, pt to continue current medical treatment glucotrol, metformin   Hyperlipidemia Lab Results  Component Value Date   LDLCALC 38 10/15/2021   Stable, pt to continue current statin lipitor 20   B12 deficiency Lab Results  Component Value Date   [id number] 093 10/15/2021   Stable, cont oral replacement - b12 1000 mcg qd   Urinary frequency For ua with labs, but suspect OAB, overall mild for now, and pt declines trial vesicare Followup: Return in about 6 months (around 04/17/2022).  Cathlean Cower, MD 10/17/2021 7:48 PM [facility]

## 2021-10-15 NOTE — Assessment & Plan Note (Signed)
Last vitamin D ?Lab Results  ?Component Value Date  ? VD25OH 29.08 (L) 04/16/2021  ? ?Low, to start oral replacement ? ?

## 2021-10-17 ENCOUNTER — Encounter: Payer: Self-pay | Admitting: Internal Medicine

## 2021-10-17 DIAGNOSIS — R350 Frequency of micturition: Secondary | ICD-10-CM | POA: Insufficient documentation

## 2021-10-17 NOTE — Assessment & Plan Note (Signed)
Age and sex appropriate education and counseling updated with regular exercise and diet ?Referrals for preventative services - pt to call for eye exam, declines colonoscopy  ?Immunizations addressed - declines covid booster, shingrix ?Smoking counseling  - none needed ?Evidence for depression or other mood disorder - none significant ?Most recent labs reviewed. ?I have personally reviewed and have noted: ?1) the patient's medical and social history ?2) The patient's current medications and supplements ?3) The patient's height, weight, and BMI have been recorded in the chart ? ?

## 2021-10-17 NOTE — Assessment & Plan Note (Signed)
For ua with labs, but suspect OAB, overall mild for now, and pt declines trial vesicare ?

## 2021-10-17 NOTE — Assessment & Plan Note (Signed)
Lab Results  ?Component Value Date  ? LDLCALC 38 10/15/2021  ? ?Stable, pt to continue current statin lipitor 20 ? ?

## 2021-10-17 NOTE — Assessment & Plan Note (Signed)
Lab Results  ?Component Value Date  ? HGBA1C 6.4 10/15/2021  ? ?Stable, pt to continue current medical treatment glucotrol, metformin ? ?

## 2021-10-17 NOTE — Assessment & Plan Note (Signed)
Lab Results  Component Value Date   VITAMINB12 765 10/15/2021   Stable, cont oral replacement - b12 1000 mcg qd  

## 2021-10-22 DIAGNOSIS — J9610 Chronic respiratory failure, unspecified whether with hypoxia or hypercapnia: Secondary | ICD-10-CM | POA: Diagnosis not present

## 2021-10-26 ENCOUNTER — Other Ambulatory Visit: Payer: Self-pay

## 2021-10-26 DIAGNOSIS — I5032 Chronic diastolic (congestive) heart failure: Secondary | ICD-10-CM

## 2021-10-26 MED ORDER — TORSEMIDE 20 MG PO TABS: 60.0000 mg | ORAL_TABLET | Freq: Two times a day (BID) | ORAL | 3 refills | Status: DC

## 2021-11-09 ENCOUNTER — Ambulatory Visit: Payer: Medicare Other

## 2021-11-18 DIAGNOSIS — J42 Unspecified chronic bronchitis: Secondary | ICD-10-CM | POA: Diagnosis not present

## 2021-11-18 DIAGNOSIS — G4733 Obstructive sleep apnea (adult) (pediatric): Secondary | ICD-10-CM | POA: Diagnosis not present

## 2021-11-22 DIAGNOSIS — J9610 Chronic respiratory failure, unspecified whether with hypoxia or hypercapnia: Secondary | ICD-10-CM | POA: Diagnosis not present

## 2021-11-27 ENCOUNTER — Encounter: Payer: Self-pay | Admitting: Podiatry

## 2021-11-27 ENCOUNTER — Ambulatory Visit (INDEPENDENT_AMBULATORY_CARE_PROVIDER_SITE_OTHER): Payer: Medicare Other | Admitting: Podiatry

## 2021-11-27 DIAGNOSIS — M2011 Hallux valgus (acquired), right foot: Secondary | ICD-10-CM

## 2021-11-27 DIAGNOSIS — B351 Tinea unguium: Secondary | ICD-10-CM

## 2021-11-27 DIAGNOSIS — M79674 Pain in right toe(s): Secondary | ICD-10-CM | POA: Diagnosis not present

## 2021-11-27 DIAGNOSIS — L723 Sebaceous cyst: Secondary | ICD-10-CM | POA: Insufficient documentation

## 2021-11-27 DIAGNOSIS — L570 Actinic keratosis: Secondary | ICD-10-CM | POA: Insufficient documentation

## 2021-11-27 DIAGNOSIS — M79675 Pain in left toe(s): Secondary | ICD-10-CM

## 2021-11-27 DIAGNOSIS — M2012 Hallux valgus (acquired), left foot: Secondary | ICD-10-CM

## 2021-11-27 DIAGNOSIS — E119 Type 2 diabetes mellitus without complications: Secondary | ICD-10-CM

## 2021-11-27 DIAGNOSIS — M2042 Other hammer toe(s) (acquired), left foot: Secondary | ICD-10-CM

## 2021-11-27 DIAGNOSIS — M2041 Other hammer toe(s) (acquired), right foot: Secondary | ICD-10-CM

## 2021-12-06 NOTE — Progress Notes (Signed)
Subjective: ?[name] [name] is a pleasant 76 y.o. male patient seen today for preventative diabetic foot care and painful elongated mycotic toenails 1-5 bilaterally which are tender when wearing enclosed shoe gear. Pain is relieved with periodic professional debridement.  ? ?Patient is inquiring about a new pair of diabetic shoes as his shoes are a couple of years old. ? ?PCP is [name], MD. Last visit was: 10/15/2021 ? ?Allergies  ?Allergen Reactions  ? Codeine   ?  Altered mental status "goofy"  ? Heparin Rash  ?   Abdominal rash 01/2013  ? ? ?Objective: ?Physical Exam ? ?General: [name] [name] is a pleasant 76 y.o. Caucasian male, morbidly obese in NAD. AAO x 3.  ? ?Vascular:  ?Capillary fill time to digits <3 seconds b/l lower extremities. Palpable pedal pulses b/l LE. Pedal hair sparse. Lower extremity skin temperature gradient within normal limits. No pain with calf compression b/l. Nonpitting edema noted b/l lower extremities. ? ?Dermatological:  ?Pedal skin with normal turgor, texture and tone bilaterally. No open wounds bilaterally. No interdigital macerations bilaterally. Toenails 1-5 b/l elongated, discolored, dystrophic, thickened, crumbly with subungual debris and tenderness to dorsal palpation. ? ?Musculoskeletal:  ?Normal muscle strength 5/5 to all lower extremity muscle groups bilaterally. No pain crepitus or joint limitation noted with ROM b/l. Hallux valgus with bunion deformity noted b/l lower extremities. Hammertoe(s) noted to the L 2nd toe. Pes planus deformity noted b/l.  ? ?Neurological:  ?Protective sensation intact 5/5 intact bilaterally with 10g monofilament b/l. Vibratory sensation intact b/l. ? ?Assessment and Plan:  ?1. Pain due to onychomycosis of toenails of both feet   ?2. Hallux valgus, acquired, bilateral   ?3. Acquired hammertoes of both feet   ?4. Non-insulin dependent type 2 diabetes mellitus ([place])   ? ?-Examined patient. ?-Continue diabetic foot care principles: inspect feet daily,  monitor glucose as recommended by PCP and/or Endocrinologist, and follow prescribed diet per PCP, Endocrinologist and/or dietician. ?-Patient to continue soft, supportive shoe gear daily. Order entered for one pair extra depth shoes and 3 pair heat moldable insoles. Patient qualifies based on diagnoses. ?-Toenails 1-5 b/l were debrided in length and girth with sterile nail nippers and dremel without iatrogenic bleeding.  ?-Patient to report any pedal injuries to medical professional immediately. ?-Patient/POA to call should there be question/concern in the interim. ? ?Return in about 3 months (around 02/26/2022). ? ?Marzetta Board, DPM ? ?

## 2021-12-22 DIAGNOSIS — J9610 Chronic respiratory failure, unspecified whether with hypoxia or hypercapnia: Secondary | ICD-10-CM | POA: Diagnosis not present

## 2022-01-22 DIAGNOSIS — J9610 Chronic respiratory failure, unspecified whether with hypoxia or hypercapnia: Secondary | ICD-10-CM | POA: Diagnosis not present

## 2022-01-24 ENCOUNTER — Other Ambulatory Visit: Payer: Self-pay | Admitting: Internal Medicine

## 2022-01-24 ENCOUNTER — Other Ambulatory Visit: Payer: Self-pay | Admitting: Cardiology

## 2022-01-24 NOTE — Telephone Encounter (Signed)
Please refill as per office routine med refill policy (all routine meds to be refilled for 3 mo or monthly (per pt preference) up to one year from last visit, then month to month grace period for 3 mo, then further med refills will have to be denied) ? ?

## 2022-01-25 NOTE — Telephone Encounter (Signed)
Prescription refill request for Eliquis received. Indication:Afib Last office visit:2/23 Scr:1.1 Age: 76 Weight:125.2 kg  Prescription refilled

## 2022-02-12 ENCOUNTER — Encounter: Payer: Self-pay | Admitting: Internal Medicine

## 2022-02-21 DIAGNOSIS — J9610 Chronic respiratory failure, unspecified whether with hypoxia or hypercapnia: Secondary | ICD-10-CM | POA: Diagnosis not present

## 2022-02-22 ENCOUNTER — Telehealth: Payer: Self-pay | Admitting: Cardiology

## 2022-02-22 NOTE — Telephone Encounter (Signed)
Pt c/o swelling: STAT is pt has developed SOB within 24 hours  If swelling, where is the swelling located? Stomach & upper legs  How much weight have you gained and in what time span? A little over 3 lbs in 3 days   Have you gained 3 pounds in a day or 5 pounds in a week? No   Do you have a log of your daily weights (if so, list)?  02/18/22 276 lbs 02/22/22 279.8 lbs  Are you currently taking a fluid pill? Yes  Are you currently SOB? No  Have you traveled recently? No    Daughter wanting to schedule an appointment regarding swelling.

## 2022-02-22 NOTE — Telephone Encounter (Signed)
Spoke with pt wife, she reports the swelling is a little more than usual. He is not really complaining of SOB. His abdomen is tight and his legs. He is currently taking 60 mg of torsemide twice daily. Follow up appointment made for Friday this week but she will call after 3 days if the swelling has not improved or his weight stays up. Instructed to increase torsemide to 80 mg twice daily for the next 3 days.

## 2022-02-25 NOTE — Progress Notes (Signed)
He was to Cardiology Clinic Note   Patient Name: [name] [name] Date of Encounter: 02/26/2022  Primary Care Provider:  [name], MD Primary Cardiologist:  [name] Ruths, MD  Patient Profile    76 year old male with history of permanent atrial fibrillation, (failed DCCV) hypertension right-sided CHF (secondary to pulmonary hypertension and OSA), diabetes mellitus type 2, hyperlipidemia, hypoventilation syndrome, on home O2;, OSA.  He also has a history of renal insufficiency and hyperkalemia on combination of ACE and spironolactone.  Past Medical History    Past Medical History:  Diagnosis Date   Acute right-sided CHF (congestive heart failure) ([place])    a. 01/2013.   Arthritis    "knees and hands; thoracic area of the spine" (01/05/2013)   BRONCHITIS, CHRONIC 01/02/2009   COMMON MIGRAINE    "none since treating for high BP"   Complication of anesthesia    "aspiration pneumonia after hand OR" (01/05/2013)   DIABETES MELLITUS, TYPE II    GOUT    HYPERLIPIDEMIA    HYPERTENSION    Long term (current) use of anticoagulants    Morbid obesity (HCC)    OBESITY HYPOVENTILATION SYNDROME    a. on home O2.   On home oxygen therapy    OSA (obstructive sleep apnea)    Permanent atrial fibrillation ([place])    PROSTATE SPECIFIC ANTIGEN, ELEVATED 06/09/2007   Pulmonary HTN ([place])    a. multifactorial including obstructive sleep apnea, obesity hypoventilation syndrome and possible pulmonary venous hypertension.   SKIN RASH 03/12/2010   Unspecified hearing loss    Past Surgical History:  Procedure Laterality Date   APPENDECTOMY  1974   CARDIOVERSION  05/21/2008; ~ 06/2008   HERNIA REPAIR     INGUINAL HERNIA REPAIR Right    RIGHT HEART CATHETERIZATION N/A 01/08/2013   Procedure: RIGHT HEART CATH;  Surgeon: [name] Headings, MD;  Location: [facility] CATH LAB;  Service: Cardiovascular;  Laterality: N/A;   TENDON REPAIR Right 2009   "lacerated his tendon and pulley" Dr. Lenon [name]   UMBILICAL HERNIA REPAIR       Allergies  Allergies  Allergen Reactions   Codeine     Altered mental status "goofy"   Heparin Rash     Abdominal rash 01/2013    History of Present Illness    [name] comes today for ongoing assessment and management of permanent atrial fibrillation, hypertension, hyperlipidemia, who called our office on 02/22/2022 due to worsening abdominal distention and lower extremity edema which his wife reported had increased over several days.  He apparently was on torsemide 60 mg twice daily, and was instructed to increase torsemide to 80 mg twice daily for 3 days and see [place] in the office today.  He reports that he has had some relief from increased dose of torsemide over the last 3 days.  He continues to note abdominal distention.  Gaining weight from the lowest weight recorded on his chart at home of 276.8- 280.6 at his highest.  He states that his breathing has been more tenuous despite oxygen.  They have increased it to 6 L from 4 L daily.  He has not seen Dr. Halford [name] in several months.  He denies any extra salt intake.  He has been less active due to the heat and weather during summer.  This is frustrating for him.  Home Medications    Current Outpatient Medications  Medication Sig Dispense Refill   allopurinol (ZYLOPRIM) 100 MG tablet TAKE 1 TABLET DAILY 90 tablet 3  atorvastatin (LIPITOR) 20 MG tablet TAKE 1 TABLET DAILY 90 tablet 3   cetirizine (ZYRTEC) 10 MG tablet Take 10 mg by mouth at bedtime.     cholecalciferol (VITAMIN D3) 25 MCG (1000 UNIT) tablet Take 2,000 Units by mouth daily.     cyanocobalamin (,VITAMIN B-12,) 1000 MCG/ML injection Inject 1,000 mcg into the muscle.     diltiazem (CARDIZEM CD) 180 MG 24 hr capsule TAKE 1 CAPSULE DAILY (NEED OFFICE VISIT) 90 capsule 3   diltiazem (TIAZAC) 180 MG 24 hr capsule      ELIQUIS 5 MG TABS tablet TAKE 1 TABLET TWICE A DAY 180 tablet 3   glipiZIDE (GLUCOTROL XL) 10 MG 24 hr tablet Take 1 tablet (10 mg total) by mouth daily with  breakfast. 90 tablet 3   glucose blood (ONE TOUCH ULTRA TEST) test strip Use to check blood sugars once daily Dx E11.9 100 each 2   Lancets MISC Use as directed once daily  E11.9 100 each 12   metFORMIN (GLUCOPHAGE-XR) 500 MG 24 hr tablet TAKE 2 TABLETS IN THE MORNING 180 tablet 3   metoprolol tartrate (LOPRESSOR) 25 MG tablet Take 1 tablet (25 mg total) by mouth 2 (two) times daily. 180 tablet 3   Misc Natural Products (TART CHERRY ADVANCED) CAPS Take 1 capsule by mouth daily.     OXYGEN Inhale 3-5 L into the lungs daily. 3L Daily  4L Resting  5L Exertion     potassium chloride SA (KLOR-CON) 20 MEQ tablet      tizanidine (ZANAFLEX) 2 MG capsule Take 1 capsule (2 mg total) by mouth 3 (three) times daily. 30 capsule 0   torsemide 60 MG TABS Monday, Wednesday,Friday Take 80 mg Twice Daily. On Alternate Days Take 60 mg Twice Daily 180 tablet 3   Zoster Vaccine Adjuvanted [facility]) injection      Current Facility-Administered Medications  Medication Dose Route Frequency Provider Last Rate Last Admin   cyanocobalamin ((VITAMIN B-12)) injection 1,000 mcg  1,000 mcg Intramuscular Q30 days [name] Borg, MD   1,000 mcg at 08/06/21 0092     Family History    Family History  Problem Relation Age of Onset   Alzheimer's disease Father    Prostate cancer Father    Cancer Other        Breast Cancer, <50 yo 1st degree relative   Coronary artery disease Other        Male, 1st degree relative   He indicated that his mother is deceased. He indicated that his father is deceased. He indicated that his brother is deceased. He indicated that his maternal grandmother is deceased. He indicated that his maternal grandfather is deceased. He indicated that his paternal grandmother is deceased. He indicated that his paternal grandfather is deceased. He indicated that the status of his other is unknown.  Social History    Social History   Socioeconomic History   Marital status: Widowed    Spouse name: Not  on file   Number of children: 2   Years of education: Not on file   Highest education level: Not on file  Occupational History   Occupation: Engineer, agricultural    Employer: [employer]  Tobacco Use   Smoking status: Former    Packs/day: 1.00    Years: 15.00    Total pack years: 15.00    Types: Cigarettes    Quit date: 08/09/1978    Years since quitting: 43.5   Smokeless tobacco: Never   Tobacco  comments:    01/05/2013 "quit smoking ~ 40 years ago"  Vaping Use   Vaping Use: Never used  Substance and Sexual Activity   Alcohol use: Yes    Alcohol/week: 0.0 standard drinks of alcohol    Comment: 01/05/2013 "hasn't had a drink since 1980's; never had problem w/it"   Drug use: No   Sexual activity: Not Currently  Other Topics Concern   Not on file  Social History Narrative   Not on file   Social Determinants of Health   Financial Resource Strain: Not on file  Food Insecurity: Not on file  Transportation Needs: Not on file  Physical Activity: Not on file  Stress: Not on file  Social Connections: Not on file  Intimate Partner Violence: Not on file     Review of Systems    General:  No chills, fever, night sweats or weight changes.  Cardiovascular:  No chest pain, increased dyspnea on exertion, edema, orthopnea, positive for redness.  Increased dyspnea on exertion. Dermatological: No rash, lesions/masses Respiratory: No cough, dyspnea Urologic: No hematuria, dysuria Abdominal:   No nausea, vomiting, diarrhea, bright red blood per rectum, melena, or hematemesis positive for increased abdominal distention Neurologic:  No visual changes, wkns, changes in mental status. All other systems reviewed and are otherwise negative except as noted above.     Physical Exam    VS:  BP (!) 148/89   Pulse 81   Ht '5\' 8"'$  (1.727 m)   Wt 278 lb 3.2 oz (126.2 kg)   SpO2 91%   BMI 42.30 kg/m  , BMI Body mass index is 42.3 kg/m.     GEN: Well nourished, well developed, in no  acute distress.  Wearing oxygen via nasal cannula.  Skin Ruddy red HEENT: normal. Neck: Supple, no JVD, carotid bruits, or masses. Cardiac: IRRR,, 2/6 systolic murmurs, rubs, or gallops. No clubbing, cyanosis, edema.  Radials/DP/PT 2+ and equal bilaterally.  Respiratory:  Respirations regular and unlabored, clear to auscultation bilaterally. GI: Soft, nontender, distended, BS + x 4. MS: no deformity or atrophy.  Thickened keratotic legs bilaterally, woody appearance Skin: warm and dry, no rash.  Keratosis noted lower extremities bilaterally Neuro:  Strength and sensation are intact. Psych: Normal affect.  Accessory Clinical Findings      Lab Results  Component Value Date   WBC 9.3 10/15/2021   HGB 14.9 10/15/2021   HCT 45.6 10/15/2021   MCV 87.2 10/15/2021   PLT 240.0 10/15/2021   Lab Results  Component Value Date   CREATININE 1.14 10/15/2021   BUN 19 10/15/2021   NA 144 10/15/2021   K 4.3 10/15/2021   CL 101 10/15/2021   CO2 35 (H) 10/15/2021   Lab Results  Component Value Date   ALT 14 10/15/2021   AST 15 10/15/2021   ALKPHOS 80 10/15/2021   BILITOT 1.5 (H) 10/15/2021   Lab Results  Component Value Date   CHOL 109 10/15/2021   HDL 40.50 10/15/2021   LDLCALC 38 10/15/2021   TRIG 152.0 (H) 10/15/2021   CHOLHDL 3 10/15/2021    Lab Results  Component Value Date   HGBA1C 6.4 10/15/2021    Review of Prior Studies:  Echocardiogram 07/22/2021 1. Left ventricular ejection fraction, by estimation, is 55 to 60%. The  left ventricle has normal function. The left ventricle has no regional  wall motion abnormalities. Left ventricular diastolic parameters are  indeterminate.   2. Right ventricular systolic function is normal. The right ventricular  size is  normal. Tricuspid regurgitation signal is inadequate for assessing  PA pressure.   3. Left atrial size was moderately dilated.   4. Right atrial size was moderately dilated.   5. The mitral valve is normal in  structure. No evidence of mitral valve  regurgitation. No evidence of mitral stenosis.   6. The aortic valve is tricuspid. Aortic valve regurgitation is not  visualized. No aortic stenosis is present.   7. The inferior vena cava is dilated in size with >50% respiratory  variability, suggesting right atrial pressure of 8 mmHg.   8. The patient was in atrial fibrillation.    Assessment & Plan   1.  Acute on chronic right heart failure: Improved with increased dose of torsemide 80 mg twice daily for 3 days.  He will now increase his torsemide to 80 mg twice daily on Monday Wednesday Friday with 60 mg twice daily on all other days.  Continue daily weights, fluid restriction, and salt reduction.  We will follow-up with a CBC and BMET.  2.  Oxygen dependent chronic bronchitis: Has had increased oxygen requirements increasing liters from 4 to 6 L via nasal cannula.  He has not been seen by pulmonology and therefore have referred him back for more thorough involvement and adjustment of oxygen maintenance.  3.  Permanent atrial fibrillation: Heart rate well controlled on diltiazem, continue Eliquis 5 mg twice daily.  Check a CBC to evaluate for anemia with worsening breathing status although this is unlikely.  4.  Hypertension: Elevated today here in the office.  But average for him on previous visits.  May need to have better blood pressure control to assist with breathing status but not decreased preload too significantly.  Close follow-up concerning increased dose of Lasix  5.  Hypercholesterolemia: Remains on atorvastatin 20 mg daily.  Labs have been followed by PCP.  Goal of LDL less than 70    Current medicines are reviewed at length with the patient today.  I have spent 30 min's  dedicated to the care of this patient on the date of this encounter to include pre-visit review of records, assessment, management and diagnostic testing,with shared decision making.   Signed, Phill Myron. West Pugh, ANP, [facility]   02/26/2022 11:51 AM    [facility] [place] [address] Office [phone] Fax [phone]  Notice: This dictation was prepared with Dragon dictation along with smaller phrase technology. Any transcriptional errors that result from this process are unintentional and may not be corrected upon review.

## 2022-02-26 ENCOUNTER — Ambulatory Visit (INDEPENDENT_AMBULATORY_CARE_PROVIDER_SITE_OTHER): Payer: Medicare Other | Admitting: Adult Health

## 2022-02-26 ENCOUNTER — Encounter: Payer: Self-pay | Admitting: Adult Health

## 2022-02-26 VITALS — BP 148/89 | HR 81 | Ht 68.0 in | Wt 278.2 lb

## 2022-02-26 DIAGNOSIS — R0609 Other forms of dyspnea: Secondary | ICD-10-CM

## 2022-02-26 DIAGNOSIS — I5032 Chronic diastolic (congestive) heart failure: Secondary | ICD-10-CM | POA: Diagnosis not present

## 2022-02-26 DIAGNOSIS — Z7901 Long term (current) use of anticoagulants: Secondary | ICD-10-CM | POA: Diagnosis not present

## 2022-02-26 DIAGNOSIS — E7800 Pure hypercholesterolemia, unspecified: Secondary | ICD-10-CM

## 2022-02-26 DIAGNOSIS — I10 Essential (primary) hypertension: Secondary | ICD-10-CM

## 2022-02-26 DIAGNOSIS — I4821 Permanent atrial fibrillation: Secondary | ICD-10-CM

## 2022-02-26 MED ORDER — TORSEMIDE 60 MG PO TABS: ORAL_TABLET | ORAL | 3 refills | Status: DC

## 2022-02-26 NOTE — Patient Instructions (Signed)
Medication Instructions:  Increase Torsemide to 80 mg Twice Daily on Monday,Wednesday,and Friday. On Alternate Day Take Torsemide 60 mg Twice Daily. *If you need a refill on your cardiac medications before your next appointment, please call your pharmacy*   Lab Work: BMET,Cbc Today If you have labs (blood work) drawn today and your tests are completely normal, you will receive your results only by: [place] (if you have MyChart) OR A paper copy in the mail If you have any lab test that is abnormal or we need to change your treatment, we will call you to review the results.   Testing/Procedures: No Testing   Follow-Up: At [facility], you and your health needs are our priority.  As part of our continuing mission to provide you with exceptional heart care, we have created designated Provider Care Teams.  These Care Teams include your primary Cardiologist (physician) and Advanced Practice Providers (APPs -  Physician Assistants and Nurse Practitioners) who all work together to provide you with the care you need, when you need it.  We recommend signing up for the patient portal called "MyChart".  Sign up information is provided on this After Visit Summary.  MyChart is used to connect with patients for Virtual Visits (Telemedicine).  Patients are able to view lab/test results, encounter notes, upcoming appointments, etc.  Non-urgent messages can be sent to your provider as well.   To learn more about what you can do with MyChart, go to [email or web address].    Your next appointment:   1 month(s)  The format for your next appointment:   In Person  Provider:   Jory Sims, DNP, ANP    Then, Kirk Ruths, MD will plan to see you again in 3 month(s).      Important Information About [place]

## 2022-02-27 LAB — BASIC METABOLIC PANEL
BUN/Creatinine Ratio: 17 (ref 10–24)
BUN: 19 mg/dL (ref 8–27)
CO2: 30 mmol/L — ABNORMAL HIGH (ref 20–29)
Calcium: 10.1 mg/dL (ref 8.6–10.2)
Chloride: 99 mmol/L (ref 96–106)
Creatinine, Ser: 1.15 mg/dL (ref 0.76–1.27)
Glucose: 132 mg/dL — ABNORMAL HIGH (ref 70–99)
Potassium: 4.2 mmol/L (ref 3.5–5.2)
Sodium: 143 mmol/L (ref 134–144)
eGFR: 66 mL/min/{1.73_m2} (ref >59)

## 2022-02-27 LAB — CBC
Hematocrit: 44.1 % (ref 37.5–51.0)
Hemoglobin: 14.2 g/dL (ref 13.0–17.7)
MCH: 28.5 pg (ref 26.6–33.0)
MCHC: 32.2 g/dL (ref 31.5–35.7)
MCV: 88 fL (ref 79–97)
Platelets: 276 10*3/uL (ref 150–450)
RBC: 4.99 x10E6/uL (ref 4.14–5.80)
RDW: 14.1 % (ref 11.6–15.4)
WBC: 10.7 10*3/uL (ref 3.4–10.8)

## 2022-03-03 ENCOUNTER — Other Ambulatory Visit: Payer: TRICARE For Life (TFL)

## 2022-03-03 ENCOUNTER — Ambulatory Visit: Payer: Medicare Other | Admitting: Podiatry

## 2022-03-04 DIAGNOSIS — G4733 Obstructive sleep apnea (adult) (pediatric): Secondary | ICD-10-CM | POA: Diagnosis not present

## 2022-03-04 DIAGNOSIS — J42 Unspecified chronic bronchitis: Secondary | ICD-10-CM | POA: Diagnosis not present

## 2022-03-05 ENCOUNTER — Telehealth: Payer: Self-pay

## 2022-03-05 DIAGNOSIS — I2781 Cor pulmonale (chronic): Secondary | ICD-10-CM

## 2022-03-05 NOTE — Telephone Encounter (Addendum)
Called patient regarding results. Spoke with Daughter Juliann Pulse. Daughter had understanding of results.----- Message from Lendon Colonel, NP sent at 03/03/2022  4:33 PM EDT ----- I have reviewed the labs. No concerns here.  Continue same regimen.  No evidence of anemia. Please have him follow up with your PCP and pulmonologist for oxygen adjustments.   KL

## 2022-03-19 ENCOUNTER — Ambulatory Visit (INDEPENDENT_AMBULATORY_CARE_PROVIDER_SITE_OTHER): Payer: Medicare Other | Admitting: Nurse Practitioner

## 2022-03-19 ENCOUNTER — Encounter: Payer: Self-pay | Admitting: Nurse Practitioner

## 2022-03-19 DIAGNOSIS — I5032 Chronic diastolic (congestive) heart failure: Secondary | ICD-10-CM | POA: Diagnosis not present

## 2022-03-19 DIAGNOSIS — J9611 Chronic respiratory failure with hypoxia: Secondary | ICD-10-CM

## 2022-03-19 DIAGNOSIS — J9612 Chronic respiratory failure with hypercapnia: Secondary | ICD-10-CM

## 2022-03-19 DIAGNOSIS — G4733 Obstructive sleep apnea (adult) (pediatric): Secondary | ICD-10-CM

## 2022-03-19 NOTE — Progress Notes (Signed)
$'@Patient'C$  ID: [name] [name], male    DOB: 08-Aug-1946, 76 y.o.   MRN: [id number]  Chief Complaint  Patient presents with   Follow-up    Pt increased his O2 to 6L x1 month. Pt was having some SOB and O2 levels were falling.    Referring provider: Lendon Colonel, NP  HPI: 76 year old male, former smoker (quit 1980) followed for severe OSA on BiPAP and chronic hypoxic/hypercapnic respiratory failure secondary to obesity hypoventilation syndrome. He is a patient of Dr. Juanetta Gosling and last seen in office on 07/19/2019. Past medical history CHF, PAF, HTN, allergic rhinitis, DM, HLD, morbid obesity.   TEST/EVENTS:  02/09/2008 ABG: pH 7.37, pCO2 55.1, pO2 76.4 06/18/2008 PSG: AHI 64>> BiPAP 18/9 with 4 L oxygen 04/03/2009 spirometry: FEV1 2.08 (69%), FEV1% 77 01/18/2013 Ono on BiPAP: Basal SpO2 91.7%, low SpO2 83%; spent 32 minutes with SpO2 less than 88% 07/31/2019 BiPAP titration: BiPAP 16/12 with 4 L supplemental O2  07/19/2019: OV with Dr. Halford Chessman.  He was told he needed a follow-up visit for oxygen and BiPAP renewal.  Ambulatory oximetry today on room air; dropped to 86% and recovered to 90s with addition supplemental oxygen 4 L pulsed.  BiPAP titration study ordered for BiPAP/oxygen renewal.  Excellent compliance with BiPAP.  03/19/2022: Today-follow-up Patient presents today with daughter for overdue follow-up.  They also had some concerns regarding his oxygen.  Reported that about a month ago he was dropping into the mid to high 80s, despite his 5 L pulsed.  They increased him to 6 L which seemed to correct this.  At the same time, he was also noted to have increased weight gain and increased shortness of breath.  He was seen by cardiology, who adjusted his torsemide with has corrected his volume status.  Today, he reports feeling better.  Feels like his breathing is at his baseline.  Has not had any more issues with oxygen dropping into the 80s at home.  He is currently using 6 L during the day with O2  saturations in the 90s.  They have not tried to wean him back to his baseline of 4 to 5 L.  He denies any cough, wheezing, orthopnea, PND, palpitations, worsening leg swelling.  He continues on BiPAP nightly.  Is not having any difficulties with it.  Feels like he rested well with it.  No significant leaks that he can tell.  Looks like at some point he got switched to AutoSet with Conor IPAP 20 cm of water and min EPAP 8 cmH2O, pressure support of 4.  Unsure when exactly this happened as the last order I see was set pressure of 16/12.  Fortunately, he seems to be well-controlled on this with residual AHI of 2.3.  02/16/2022-03/17/2022: BiPAP Airview download; IPAP Kaz[place] 20, EPAP min 8,  PS 4 30/30 days used; 97% >4 hr; average usage 12 hours 13 minutes Leaks median 0, 95th 21.9 AHI 2.3, CAI 0.3 Pressure IPAP 95th 14.3, EPAP 10.3  Allergies  Allergen Reactions   Codeine     Altered mental status "goofy"   Heparin Rash     Abdominal rash 01/2013    Immunization History  Administered Date(s) Administered   Fluad Quad(high Dose 65+) 05/10/2019, 06/06/2020, 04/16/2021   H1N1 07/15/2008   Influenza Split 05/17/2011, 06/09/2012   Influenza Whole 05/09/2008, 06/24/2009, 06/26/2010   Influenza, High Dose Seasonal PF 04/15/2015, 04/19/2017, 04/25/2018   Influenza,inj,Quad PF,6+ Mos 04/05/2013, 04/11/2014, 04/13/2016   PFIZER(Purple Top)SARS-COV-2 Vaccination 10/07/2019,  10/31/2019, 08/30/2020   Pneumococcal Conjugate-13 06/01/2013, 10/09/2013   Pneumococcal Polysaccharide-23 02/07/2008, 10/19/2017   Td 02/07/2008   Tdap 04/25/2018   Zoster Recombinat (Shingrix) 04/06/2019   Zoster, Live 07/02/2008    Past Medical History:  Diagnosis Date   Acute right-sided CHF (congestive heart failure) ([facility])    a. 01/2013.   Arthritis    "knees and hands; thoracic area of the spine" (01/05/2013)   BRONCHITIS, CHRONIC 01/02/2009   COMMON MIGRAINE    "none since treating for high BP"   Complication of  anesthesia    "aspiration pneumonia after hand OR" (01/05/2013)   DIABETES MELLITUS, TYPE II    GOUT    HYPERLIPIDEMIA    HYPERTENSION    Long term (current) use of anticoagulants    Morbid obesity (HCC)    OBESITY HYPOVENTILATION SYNDROME    a. on home O2.   On home oxygen therapy    OSA (obstructive sleep apnea)    Permanent atrial fibrillation ([facility])    PROSTATE SPECIFIC ANTIGEN, ELEVATED 06/09/2007   Pulmonary HTN ([facility])    a. multifactorial including obstructive sleep apnea, obesity hypoventilation syndrome and possible pulmonary venous hypertension.   SKIN RASH 03/12/2010   Unspecified hearing loss     Tobacco History: Social History   Tobacco Use  Smoking Status Former   Packs/day: 1.00   Years: 15.00   Total pack years: 15.00   Types: Cigarettes   Quit date: 08/09/1978   Years since quitting: 43.6  Smokeless Tobacco Never  Tobacco Comments   01/05/2013 "quit smoking ~ 40 years ago"   Counseling given: Not Answered Tobacco comments: 01/05/2013 "quit smoking ~ 40 years ago"   Outpatient Medications Prior to Visit  Medication Sig Dispense Refill   allopurinol (ZYLOPRIM) 100 MG tablet TAKE 1 TABLET DAILY 90 tablet 3   atorvastatin (LIPITOR) 20 MG tablet TAKE 1 TABLET DAILY 90 tablet 3   cetirizine (ZYRTEC) 10 MG tablet Take 10 mg by mouth at bedtime.     cholecalciferol (VITAMIN D3) 25 MCG (1000 UNIT) tablet Take 2,000 Units by mouth daily.     cyanocobalamin (,VITAMIN B-12,) 1000 MCG/ML injection Inject 1,000 mcg into the muscle.     diltiazem (CARDIZEM CD) 180 MG 24 hr capsule TAKE 1 CAPSULE DAILY (NEED OFFICE VISIT) 90 capsule 3   diltiazem (TIAZAC) 180 MG 24 hr capsule      ELIQUIS 5 MG TABS tablet TAKE 1 TABLET TWICE A DAY 180 tablet 3   glipiZIDE (GLUCOTROL XL) 10 MG 24 hr tablet Take 1 tablet (10 mg total) by mouth daily with breakfast. 90 tablet 3   glucose blood (ONE TOUCH ULTRA TEST) test strip Use to check blood sugars once daily Dx E11.9 100 each 2   Lancets  MISC Use as directed once daily  E11.9 100 each 12   metFORMIN (GLUCOPHAGE-XR) 500 MG 24 hr tablet TAKE 2 TABLETS IN THE MORNING 180 tablet 3   metoprolol tartrate (LOPRESSOR) 25 MG tablet Take 1 tablet (25 mg total) by mouth 2 (two) times daily. 180 tablet 3   Misc Natural Products (TART CHERRY ADVANCED) CAPS Take 1 capsule by mouth daily.     OXYGEN Inhale 3-5 L into the lungs daily. 3L Daily  4L Resting  5L Exertion     potassium chloride SA (KLOR-CON) 20 MEQ tablet      tizanidine (ZANAFLEX) 2 MG capsule Take 1 capsule (2 mg total) by mouth 3 (three) times daily. 30 capsule 0   torsemide  60 MG TABS Monday, Wednesday,Friday Take 80 mg Twice Daily. On Alternate Days Take 60 mg Twice Daily 180 tablet 3   Zoster Vaccine Adjuvanted [facility]) injection      Facility-Administered Medications Prior to Visit  Medication Dose Route Frequency Provider Last Rate Last Admin   cyanocobalamin ((VITAMIN B-12)) injection 1,000 mcg  1,000 mcg Intramuscular Q30 days [name], MD   1,000 mcg at 08/06/21 4540     Review of Systems:   Constitutional: No weight loss or gain, night sweats, fevers, chills, fatigue, or lassitude. HEENT: No headaches, difficulty swallowing, tooth/dental problems, or sore throat. No sneezing, itching, ear ache, nasal congestion, or post nasal drip CV:  +swelling in lower extremities (improved; baseline). No chest pain, orthopnea, PND, anasarca, dizziness, palpitations, syncope Resp: +shortness of breath with exertion (baseline). No excess mucus or change in color of mucus. No productive or non-productive. No hemoptysis. No wheezing.  No chest wall deformity Skin: No rash, lesions, ulcerations MSK:  No joint pain or swelling.  No decreased range of motion.  No back pain. Neuro: No dizziness or lightheadedness.  Psych: No depression or anxiety. Mood stable.     Physical Exam:  BP 114/66 (BP Location: Right Arm, Patient Position: Sitting, Cuff Size: Large)   Pulse 67    Temp 98.8 F (37.1 C) (Oral)   Ht '5\' 8"'$  (1.727 m)   Wt 276 lb 6.4 oz (125.4 kg)   SpO2 97%   BMI 42.03 kg/m   GEN: Pleasant, interactive, chronically-ill appearing; morbidly obese; in no acute distress. HEENT:  Normocephalic and atraumatic. PERRLA. Sclera white. Nasal turbinates pink, moist and patent bilaterally. No rhinorrhea present. Oropharynx pink and moist, without exudate or edema. No lesions, ulcerations, or postnasal drip.  NECK:  Supple w/ fair ROM. No JVD present.  CV: RRR, no m/r/g, dependent BLE edema. Pulses intact, +2 bilaterally. No cyanosis, pallor or clubbing. PULMONARY:  Unlabored, regular breathing. Clear bilaterally A&P w/o wheezes/rales/rhonchi. No accessory muscle use. No dullness to percussion. GI: BS present and normoactive. Soft, non-tender to palpation.  MSK: No deformities or joint swelling noted.  Neuro: A/Ox3. No focal deficits noted.   Skin: Warm, no lesions or rashe Psych: Normal affect and behavior. Judgement and thought content appropriate.     Lab Results:  CBC    Component Value Date/Time   WBC 10.7 02/26/2022 0956   WBC 9.3 10/15/2021 1115   RBC 4.99 02/26/2022 0956   RBC 5.23 10/15/2021 1115   HGB 14.2 02/26/2022 0956   HCT 44.1 02/26/2022 0956   PLT 276 02/26/2022 0956   MCV 88 02/26/2022 0956   MCH 28.5 02/26/2022 0956   MCH 28.6 04/11/2020 1409   MCHC 32.2 02/26/2022 0956   MCHC 32.7 10/15/2021 1115   RDW 14.1 02/26/2022 0956   LYMPHSABS 2.0 10/15/2021 1115   MONOABS 0.5 10/15/2021 1115   EOSABS 0.2 10/15/2021 1115   BASOSABS 0.1 10/15/2021 1115    BMET    Component Value Date/Time   NA 143 02/26/2022 0956   K 4.2 02/26/2022 0956   CL 99 02/26/2022 0956   CO2 30 (H) 02/26/2022 0956   GLUCOSE 132 (H) 02/26/2022 0956   GLUCOSE 73 10/15/2021 1115   BUN 19 02/26/2022 0956   CREATININE 1.15 02/26/2022 0956   CREATININE 1.23 (H) 04/11/2020 1409   CALCIUM 10.1 02/26/2022 0956   GFRNONAA 70 04/28/2020 0927   GFRNONAA 57 (L)  04/11/2020 1409   GFRAA 81 04/28/2020 0927   GFRAA 67 04/11/2020  1409    BNP    Component Value Date/Time   BNP 197.0 (H) 12/23/2015 1531   BNP 103.7 (H) 07/22/2014 1025     Imaging:  No results found.        No data to display          No results found for: "NITRICOXIDE"      Assessment & Plan:   Chronic respiratory failure with hypoxia and hypercapnia related to obesity hypoventilation syndrome Recently with increased O2 demand related to acute on chronic CHF exacerbation and volume overload. Significant improvement in breathing with volume correction. Weight and BLE edema are at his baseline. He still is on 6 lpm pulsed O2. Maintained sats >90% on pulsed 4 lpm at rest. Walking oximetry today on 4 lpm; initially maintained saturations 90% or better after one lap then desaturated to 86%. Corrected to 95% with increase to 5 lpm, which they report has been his baseline with activity for years. Continue 4 lpm with rest and with BiPAP and 5 lpm with activity. Goal >88-90%. Advised them to notify if they have any further issues.  Patient Instructions  Continue BiPAP nightly, minimum of 4-6 hours, with oxygen 4 lpm bled through. You're doing a great job using it!! Continue supplemental oxygen 4-5 lpm during the day, goal oxygen >88-90% Continue torsemide as directed by cardiology   Follow up in 3 months with Dr. Halford Chessman or Katie Jesenya Bowditch,NP. If symptoms do not improve or worsen, please contact office for sooner follow up or seek emergency care.     OSA treated with BiPAP Excellent compliance and continues to receive good benefit. At some point, he was switched to auto setting of IPAP [place] 20, EPAP min 8, and PS 4. He appears well controlled on this with residual AHI of 2.3. No breakthrough symptoms. Encouraged continued compliance and no changes made today. Continue 4 lpm at night bled through BiPAP.   Chronic diastolic CHF (congestive heart failure) (HCC) Recent mild  exacerbation with volume overload; corrected with increased torsemide dosing and clinically improved today. Follow up with cardiology as scheduled.    I spent 28 minutes of dedicated to the care of this patient on the date of this encounter to include pre-visit review of records, face-to-face time with the patient discussing conditions above, post visit ordering of testing, clinical documentation with the electronic health record, making appropriate referrals as documented, and communicating necessary findings to members of the patients care team.  Clayton Bibles, NP 03/19/2022  Pt aware and understands NP's role.

## 2022-03-19 NOTE — Assessment & Plan Note (Signed)
Recently with increased O2 demand related to acute on chronic CHF exacerbation and volume overload. Significant improvement in breathing with volume correction. Weight and BLE edema are at his baseline. He still is on 6 lpm pulsed O2. Maintained sats >90% on pulsed 4 lpm at rest. Walking oximetry today on 4 lpm; initially maintained saturations 90% or better after one lap then desaturated to 86%. Corrected to 95% with increase to 5 lpm, which they report has been his baseline with activity for years. Continue 4 lpm with rest and with BiPAP and 5 lpm with activity. Goal >88-90%. Advised them to notify if they have any further issues.  Patient Instructions  Continue BiPAP nightly, minimum of 4-6 hours, with oxygen 4 lpm bled through. You're doing a great job using it!! Continue supplemental oxygen 4-5 lpm during the day, goal oxygen >88-90% Continue torsemide as directed by cardiology   Follow up in 3 months with [name] [name] or [name] Cloe Sockwell,NP. If symptoms do not improve or worsen, please contact office for sooner follow up or seek emergency care.

## 2022-03-19 NOTE — Assessment & Plan Note (Signed)
Excellent compliance and continues to receive good benefit. At some point, he was switched to auto setting of IPAP Z[place] 20, EPAP min 8, and PS 4. He appears well controlled on this with residual AHI of 2.3. No breakthrough symptoms. Encouraged continued compliance and no changes made today. Continue 4 lpm at night bled through BiPAP.

## 2022-03-19 NOTE — Progress Notes (Signed)
Reviewed and agree with assessment/plan.   Chesley Mires, MD [facility] Pulmonary/Critical Care 03/19/2022, 11:44 AM Pager:  [phone]

## 2022-03-19 NOTE — Assessment & Plan Note (Signed)
Recent mild exacerbation with volume overload; corrected with increased torsemide dosing and clinically improved today. Follow up with cardiology as scheduled.

## 2022-03-19 NOTE — Patient Instructions (Addendum)
Continue BiPAP nightly, minimum of 4-6 hours, with oxygen 4 lpm bled through. You're doing a great job using it!! Continue supplemental oxygen 4-5 lpm during the day, goal oxygen >88-90% Continue torsemide as directed by cardiology   Follow up in 3 months with Dr. Halford Chessman or Katie Torrian Canion,NP. If symptoms do not improve or worsen, please contact office for sooner follow up or seek emergency care.

## 2022-03-24 ENCOUNTER — Other Ambulatory Visit: Payer: Self-pay | Admitting: Cardiology

## 2022-03-24 DIAGNOSIS — J9610 Chronic respiratory failure, unspecified whether with hypoxia or hypercapnia: Secondary | ICD-10-CM | POA: Diagnosis not present

## 2022-03-24 DIAGNOSIS — I5032 Chronic diastolic (congestive) heart failure: Secondary | ICD-10-CM

## 2022-04-08 NOTE — Progress Notes (Signed)
Cardiology Clinic Note   Patient Name: [name] [name] Date of Encounter: 04/09/2022  Primary Care Provider:  [name], MD Primary Cardiologist:  [name] Ruths, MD  Patient Profile    76 year old male with history of permanent atrial fibrillation, (failed DCCV) hypertension right-sided CHF (secondary to pulmonary hypertension and OSA), diabetes mellitus type 2, hyperlipidemia, hypoventilation syndrome, on home O2 (6 L via nasal cannula);, OSA.  He also has a history of renal insufficiency and hyperkalemia on combination of ACE and spironolactone.    Last seen in the office on 02/26/2022 after increased dose of torsemide due to volume overload.  He was to increase his torsemide to 80 mg twice daily on Mondays Wednesdays and Fridays, with 60 mg twice daily on other days.  He was referred to pulmonology due to oxygen dependent chronic bronchitis.    The patient was seen by [name] [name], nurse practitioner.  Walking oximetry on 4 L maintain saturations of 90%, but with increased to 5 L his saturations increased to 95%.  He was to continue 4 L with rest and with BiPAP and 5 L with activity.  Weight during that office visit was 276 pounds.  Past Medical History    Past Medical History:  Diagnosis Date   Acute right-sided CHF (congestive heart failure) ([place])    a. 01/2013.   Arthritis    "knees and hands; thoracic area of the spine" (01/05/2013)   BRONCHITIS, CHRONIC 01/02/2009   COMMON MIGRAINE    "none since treating for high BP"   Complication of anesthesia    "aspiration pneumonia after hand OR" (01/05/2013)   DIABETES MELLITUS, TYPE II    GOUT    HYPERLIPIDEMIA    HYPERTENSION    Long term (current) use of anticoagulants    Morbid obesity (HCC)    OBESITY HYPOVENTILATION SYNDROME    a. on home O2.   On home oxygen therapy    OSA (obstructive sleep apnea)    Permanent atrial fibrillation ([place])    PROSTATE SPECIFIC ANTIGEN, ELEVATED 06/09/2007   Pulmonary HTN ([place])    a.  multifactorial including obstructive sleep apnea, obesity hypoventilation syndrome and possible pulmonary venous hypertension.   SKIN RASH 03/12/2010   Unspecified hearing loss    Past Surgical History:  Procedure Laterality Date   APPENDECTOMY  1974   CARDIOVERSION  05/21/2008; ~ 06/2008   HERNIA REPAIR     INGUINAL HERNIA REPAIR Right    RIGHT HEART CATHETERIZATION N/A 01/08/2013   Procedure: RIGHT HEART CATH;  Surgeon: [name] Headings, MD;  Location: [facility] CATH LAB;  Service: Cardiovascular;  Laterality: N/A;   TENDON REPAIR Right 2009   "lacerated his tendon and pulley" Dr. Lenon [name]   UMBILICAL HERNIA REPAIR      Allergies  Allergies  Allergen Reactions   Codeine     Altered mental status "goofy"   Heparin Rash     Abdominal rash 01/2013    History of Present Illness    [name] presents today for ongoing assessment and management of permanent Afib CHADS VASC Score 5, HTN, right sided CHF, HL.  He is feeling much better, continues to lose weight and has dropped an additional 4 pounds since being seen by pulmonology.  His daughter is very supportive and is his main caregiver.  She make sure that he takes his medications, wears his oxygen, and stays active as he can be.  He admits to sleeping a lot but this is getting better.  He also reported  that he was having a lot of depression when he was feeling badly, but since he has been able to maintain euvolemic status, and have his oxygen needs adjusted to pulmonology he is feeling much better which is really lightened his mood.  In fact, he is going to the [employer] with his entire family starting tomorrow for a week and he is very happy about this and looking forward to the trip.  His only complaint is some bursitis in his left shoulder and elbow for which she is being treated by Dr. Alroy [name].  Home Medications    Current Outpatient Medications  Medication Sig Dispense Refill   allopurinol (ZYLOPRIM) 100 MG tablet TAKE 1 TABLET DAILY 90 tablet  3   atorvastatin (LIPITOR) 20 MG tablet TAKE 1 TABLET DAILY 90 tablet 3   cetirizine (ZYRTEC) 10 MG tablet Take 10 mg by mouth at bedtime.     cholecalciferol (VITAMIN D3) 25 MCG (1000 UNIT) tablet Take 2,000 Units by mouth daily.     cyanocobalamin (,VITAMIN B-12,) 1000 MCG/ML injection Inject 1,000 mcg into the muscle.     diltiazem (CARDIZEM CD) 180 MG 24 hr capsule TAKE 1 CAPSULE DAILY (NEED OFFICE VISIT) 90 capsule 3   diltiazem (TIAZAC) 180 MG 24 hr capsule      ELIQUIS 5 MG TABS tablet TAKE 1 TABLET TWICE A DAY 180 tablet 3   glipiZIDE (GLUCOTROL XL) 10 MG 24 hr tablet Take 1 tablet (10 mg total) by mouth daily with breakfast. 90 tablet 3   glucose blood (ONE TOUCH ULTRA TEST) test strip Use to check blood sugars once daily Dx E11.9 100 each 2   Lancets MISC Use as directed once daily  E11.9 100 each 12   metFORMIN (GLUCOPHAGE-XR) 500 MG 24 hr tablet TAKE 2 TABLETS IN THE MORNING 180 tablet 3   metoprolol tartrate (LOPRESSOR) 25 MG tablet Take 1 tablet (25 mg total) by mouth 2 (two) times daily. 180 tablet 3   Misc Natural Products (TART CHERRY ADVANCED) CAPS Take 1 capsule by mouth daily.     OXYGEN Inhale 3-5 L into the lungs daily. 3L Daily  4L Resting  5L Exertion     potassium chloride SA (KLOR-CON) 20 MEQ tablet      tizanidine (ZANAFLEX) 2 MG capsule Take 1 capsule (2 mg total) by mouth 3 (three) times daily. 30 capsule 0   torsemide 60 MG TABS Monday, Wednesday,Friday Take 80 mg Twice Daily. On Alternate Days Take 60 mg Twice Daily 180 tablet 3   Zoster Vaccine Adjuvanted [facility]) injection      Current Facility-Administered Medications  Medication Dose Route Frequency Provider Last Rate Last Admin   cyanocobalamin ((VITAMIN B-12)) injection 1,000 mcg  1,000 mcg Intramuscular Q30 days [name] Borg, MD   1,000 mcg at 08/06/21 6712     Family History    Family History  Problem Relation Age of Onset   Alzheimer's disease Father    Prostate cancer Father    Cancer Other         Breast Cancer, <50 yo 1st degree relative   Coronary artery disease Other        Male, 1st degree relative   He indicated that his mother is deceased. He indicated that his father is deceased. He indicated that his brother is deceased. He indicated that his maternal grandmother is deceased. He indicated that his maternal grandfather is deceased. He indicated that his paternal grandmother is deceased. He indicated that his paternal  grandfather is deceased. He indicated that the status of his other is unknown.  Social History    Social History   Socioeconomic History   Marital status: Widowed    Spouse name: Not on file   Number of children: 2   Years of education: Not on file   Highest education level: Not on file  Occupational History   Occupation: Engineer, agricultural    Employer: [employer]  Tobacco Use   Smoking status: Former    Packs/day: 1.00    Years: 15.00    Total pack years: 15.00    Types: Cigarettes    Quit date: 08/09/1978    Years since quitting: 43.6   Smokeless tobacco: Never   Tobacco comments:    01/05/2013 "quit smoking ~ 40 years ago"  Vaping Use   Vaping Use: Never used  Substance and Sexual Activity   Alcohol use: Yes    Alcohol/week: 0.0 standard drinks of alcohol    Comment: 01/05/2013 "hasn't had a drink since 1980's; never had problem w/it"   Drug use: No   Sexual activity: Not Currently  Other Topics Concern   Not on file  Social History Narrative   Not on file   Social Determinants of Health   Financial Resource Strain: Not on file  Food Insecurity: Not on file  Transportation Needs: Not on file  Physical Activity: Not on file  Stress: Not on file  Social Connections: Not on file  Intimate Partner Violence: Not on file     Review of Systems    General:  No chills, fever, night sweats or weight changes.  Cardiovascular:  No chest pain, chronic dyspnea on exertion, edema, orthopnea, palpitations, paroxysmal nocturnal  dyspnea. Dermatological: No rash, lesions/masses Respiratory: No cough, dyspnea Urologic: No hematuria, dysuria Abdominal:   No nausea, vomiting, diarrhea, bright red blood per rectum, melena, or hematemesis Neurologic:  No visual changes, wkns, changes in mental status. All other systems reviewed and are otherwise negative except as noted above.     Physical Exam    VS:  BP 135/79   Pulse (!) 55   Ht '5\' 8"'$  (1.727 m)   Wt 272 lb (123.4 kg)   SpO2 98%   BMI 41.36 kg/m  , BMI Body mass index is 41.36 kg/m.     GEN: Well nourished, well developed, in no acute distress.  Obese HEENT: normal. Neck: Supple, no JVD, carotid bruits, or masses. Cardiac: IRRR, no murmurs, rubs, or gallops. No clubbing, cyanosis, woody edema, is noted.  Radials/DP/PT 2+ and equal bilaterally.  Diminished pulses distally. Respiratory:  Respirations regular and unlabored, wearing oxygen via nasal cannula at 6 L bibasilar crackles on inspiration, with expiratory wheezes.   GI: Soft, nontender, nondistended, BS + x 4. MS: no deformity or atrophy. Skin: warm and dry, no rash. Neuro:  Strength and sensation are somewhat diminished. Psych: Normal affect.  Accessory Clinical Findings    ECG personally reviewed by me today-no EKG completed today  Lab Results  Component Value Date   WBC 10.7 02/26/2022   HGB 14.2 02/26/2022   HCT 44.1 02/26/2022   MCV 88 02/26/2022   PLT 276 02/26/2022   Lab Results  Component Value Date   CREATININE 1.15 02/26/2022   BUN 19 02/26/2022   NA 143 02/26/2022   K 4.2 02/26/2022   CL 99 02/26/2022   CO2 30 (H) 02/26/2022   Lab Results  Component Value Date   ALT 14 10/15/2021   AST  15 10/15/2021   ALKPHOS 80 10/15/2021   BILITOT 1.5 (H) 10/15/2021   Lab Results  Component Value Date   CHOL 109 10/15/2021   HDL 40.50 10/15/2021   LDLCALC 38 10/15/2021   TRIG 152.0 (H) 10/15/2021   CHOLHDL 3 10/15/2021    Lab Results  Component Value Date   HGBA1C 6.4  10/15/2021    Review of Prior Studies: Echocardiogram 07/22/2021 1. Left ventricular ejection fraction, by estimation, is 55 to 60%. The  left ventricle has normal function. The left ventricle has no regional  wall motion abnormalities. Left ventricular diastolic parameters are  indeterminate.   2. Right ventricular systolic function is normal. The right ventricular  size is normal. Tricuspid regurgitation signal is inadequate for assessing  PA pressure.   3. Left atrial size was moderately dilated.   4. Right atrial size was moderately dilated.   5. The mitral valve is normal in structure. No evidence of mitral valve  regurgitation. No evidence of mitral stenosis.   6. The aortic valve is tricuspid. Aortic valve regurgitation is not  visualized. No aortic stenosis is present.   7. The inferior vena cava is dilated in size with >50% respiratory  variability, suggesting right atrial pressure of 8 mmHg.   8. The patient was in atrial fibrillation.     Assessment & Plan   Permanent atrial fibrillation: Doing well heart rate is controlled on medication regimen with diltiazem 180 mg daily, and metoprolol 25 mg twice daily.  Remains on Eliquis 5 mg daily for CHA2DS2-VASc score of 5.  2.   Chronic right-sided heart failure with pulmonary hypertension: He continues on low-sodium diet with torsemide 80 mg every other day alternating with 60 mg every other day.  He states that he may gain a pound or 2 on the days that he only takes 60 mg tablets but then goes back to baseline weight or lower after an 80 mg tablet.    He is feeling much better concerning volume overload and is beginning to be more active.  He appears brighter and more engaged on this appointment.  I have advised him while he is on vacation to make sure that he continues to weigh himself and may end up taking 80 mg on days that he normally takes 60 mg if he does have some weight gain due to eating foods which she normally would not  eat.  He verbalizes understanding.  3.  COPD: Oxygen dependent now at 6 L per nasal cannula 24 hours a day instead of lower during resting state.  He states he was unable to tolerate the 5 L during the day and feels much better at 6 L.  Pulmonary is aware.  Additional treatment deferred to pulmonology and he will see them in November 2023.  4.  Hypertension: Currently at goal.  He is encouraged to continue his medication regimen and to avoid salt especially while on vacation.  5.  Type 2 diabetes: Followed by PCP.    Current medicines are reviewed at length with the patient today.  I have spent 25 min's  dedicated to the care of this patient on the date of this encounter to include pre-visit review of records, assessment, management and diagnostic testing,with shared decision making. Signed, Phill Myron. West Pugh, ANP, AACC   04/09/2022 10:22 AM      Office [phone] Fax [phone]  Notice: This dictation was prepared with Dragon dictation along with smaller phrase technology. Any transcriptional errors that result  from this process are unintentional and may not be corrected upon review.

## 2022-04-09 ENCOUNTER — Encounter: Payer: Self-pay | Admitting: Adult Health

## 2022-04-09 ENCOUNTER — Ambulatory Visit: Payer: Medicare Other | Attending: Adult Health | Admitting: Adult Health

## 2022-04-09 VITALS — BP 135/79 | HR 55 | Ht 68.0 in | Wt 272.0 lb

## 2022-04-09 DIAGNOSIS — E7800 Pure hypercholesterolemia, unspecified: Secondary | ICD-10-CM | POA: Diagnosis not present

## 2022-04-09 DIAGNOSIS — E118 Type 2 diabetes mellitus with unspecified complications: Secondary | ICD-10-CM | POA: Diagnosis not present

## 2022-04-09 DIAGNOSIS — I10 Essential (primary) hypertension: Secondary | ICD-10-CM | POA: Diagnosis not present

## 2022-04-09 DIAGNOSIS — I2781 Cor pulmonale (chronic): Secondary | ICD-10-CM

## 2022-04-09 DIAGNOSIS — G4733 Obstructive sleep apnea (adult) (pediatric): Secondary | ICD-10-CM

## 2022-04-09 NOTE — Patient Instructions (Signed)
Medication Instructions:  No Changes *If you need a refill on your cardiac medications before your next appointment, please call your pharmacy*   Lab Work: No labs If you have labs (blood work) drawn today and your tests are completely normal, you will receive your results only by: [place] (if you have MyChart) OR A paper copy in the mail If you have any lab test that is abnormal or we need to change your treatment, we will call you to review the results.   Testing/Procedures: No Testing   Follow-Up: At [facility], you and your health needs are our priority.  As part of our continuing mission to provide you with exceptional heart care, we have created designated Provider Care Teams.  These Care Teams include your primary Cardiologist (physician) and Advanced Practice Providers (APPs -  Physician Assistants and Nurse Practitioners) who all work together to provide you with the care you need, when you need it.  We recommend signing up for the patient portal called "MyChart".  Sign up information is provided on this After Visit Summary.  MyChart is used to connect with patients for Virtual Visits (Telemedicine).  Patients are able to view lab/test results, encounter notes, upcoming appointments, etc.  Non-urgent messages can be sent to your provider as well.   To learn more about what you can do with MyChart, go to [email or web address].    Your next appointment:   Keep Scheduled Appontment  The format for your next appointment:   In Person  Provider:   Kirk Ruths, MD

## 2022-04-24 DIAGNOSIS — J9610 Chronic respiratory failure, unspecified whether with hypoxia or hypercapnia: Secondary | ICD-10-CM | POA: Diagnosis not present

## 2022-04-26 ENCOUNTER — Ambulatory Visit: Payer: Medicare Other | Admitting: Internal Medicine

## 2022-04-29 ENCOUNTER — Other Ambulatory Visit: Payer: Self-pay

## 2022-04-29 MED ORDER — TORSEMIDE 60 MG PO TABS: ORAL_TABLET | ORAL | 3 refills | Status: DC

## 2022-04-30 ENCOUNTER — Ambulatory Visit (INDEPENDENT_AMBULATORY_CARE_PROVIDER_SITE_OTHER): Payer: Medicare Other | Admitting: Internal Medicine

## 2022-04-30 ENCOUNTER — Encounter: Payer: Self-pay | Admitting: Internal Medicine

## 2022-04-30 VITALS — BP 124/86 | HR 60 | Temp 98.3°F | Ht 68.0 in | Wt 279.0 lb

## 2022-04-30 DIAGNOSIS — E119 Type 2 diabetes mellitus without complications: Secondary | ICD-10-CM | POA: Diagnosis not present

## 2022-04-30 DIAGNOSIS — I5032 Chronic diastolic (congestive) heart failure: Secondary | ICD-10-CM

## 2022-04-30 DIAGNOSIS — Z23 Encounter for immunization: Secondary | ICD-10-CM | POA: Diagnosis not present

## 2022-04-30 DIAGNOSIS — E7800 Pure hypercholesterolemia, unspecified: Secondary | ICD-10-CM

## 2022-04-30 DIAGNOSIS — I10 Essential (primary) hypertension: Secondary | ICD-10-CM

## 2022-04-30 DIAGNOSIS — E559 Vitamin D deficiency, unspecified: Secondary | ICD-10-CM | POA: Diagnosis not present

## 2022-04-30 DIAGNOSIS — E538 Deficiency of other specified B group vitamins: Secondary | ICD-10-CM | POA: Diagnosis not present

## 2022-04-30 LAB — HEPATIC FUNCTION PANEL
ALT: 11 U/L (ref 0–53)
AST: 12 U/L (ref 0–37)
Albumin: 3.6 g/dL (ref 3.5–5.2)
Alkaline Phosphatase: 79 U/L (ref 39–117)
Bilirubin, Direct: 0.2 mg/dL (ref 0.0–0.3)
Total Bilirubin: 1 mg/dL (ref 0.2–1.2)
Total Protein: 7.1 g/dL (ref 6.0–8.3)

## 2022-04-30 LAB — CBC WITH DIFFERENTIAL/PLATELET
Basophils Absolute: 0.1 10*3/uL (ref 0.0–0.1)
Basophils Relative: 0.7 % (ref 0.0–3.0)
Eosinophils Absolute: 0.2 10*3/uL (ref 0.0–0.7)
Eosinophils Relative: 2.1 % (ref 0.0–5.0)
HCT: 43.4 % (ref 39.0–52.0)
Hemoglobin: 14.2 g/dL (ref 13.0–17.0)
Lymphocytes Relative: 25.8 % (ref 12.0–46.0)
Lymphs Abs: 2.7 10*3/uL (ref 0.7–4.0)
MCHC: 32.6 g/dL (ref 30.0–36.0)
MCV: 87 fl (ref 78.0–100.0)
Monocytes Absolute: 0.9 10*3/uL (ref 0.1–1.0)
Monocytes Relative: 8.5 % (ref 3.0–12.0)
Neutro Abs: 6.7 10*3/uL (ref 1.4–7.7)
Neutrophils Relative %: 62.9 % (ref 43.0–77.0)
Platelets: 237 10*3/uL (ref 150.0–400.0)
RBC: 4.99 Mil/uL (ref 4.22–5.81)
RDW: 16.3 % — ABNORMAL HIGH (ref 11.5–15.5)
WBC: 10.7 10*3/uL — ABNORMAL HIGH (ref 4.0–10.5)

## 2022-04-30 LAB — BASIC METABOLIC PANEL
BUN: 20 mg/dL (ref 6–23)
CO2: 36 mEq/L — ABNORMAL HIGH (ref 19–32)
Calcium: 9.9 mg/dL (ref 8.4–10.5)
Chloride: 103 mEq/L (ref 96–112)
Creatinine, Ser: 1.23 mg/dL (ref 0.40–1.50)
GFR: 57.07 mL/min — ABNORMAL LOW (ref >60.00)
Glucose, Bld: 99 mg/dL (ref 70–99)
Potassium: 3.7 mEq/L (ref 3.5–5.1)
Sodium: 146 mEq/L — ABNORMAL HIGH (ref 135–145)

## 2022-04-30 LAB — LIPID PANEL
Cholesterol: 115 mg/dL (ref 0–200)
HDL: 42.6 mg/dL (ref >39.00)
LDL Cholesterol: 41 mg/dL (ref 0–99)
NonHDL: 72.25
Total CHOL/HDL Ratio: 3
Triglycerides: 158 mg/dL — ABNORMAL HIGH (ref 0.0–149.0)
VLDL: 31.6 mg/dL (ref 0.0–40.0)

## 2022-04-30 LAB — TSH: TSH: 2.41 u[IU]/mL (ref 0.35–5.50)

## 2022-04-30 LAB — VITAMIN D 25 HYDROXY (VIT D DEFICIENCY, FRACTURES): VITD: 61.62 ng/mL (ref 30.00–100.00)

## 2022-04-30 MED ORDER — CYANOCOBALAMIN 1000 MCG/ML IJ SOLN
1000.0000 ug | Freq: Once | INTRAMUSCULAR | Status: AC
  Administered 2022-04-30: 1000 ug via INTRAMUSCULAR

## 2022-04-30 NOTE — Patient Instructions (Addendum)
Please check with the [facility] about the second shingles shot  You had the flu shot and B12 shot today  Please continue all other medications as before, and refills have been done if requested.  Please have the pharmacy call with any other refills you may need.  Please continue your efforts at being more active, low cholesterol diet, and weight control.  Please keep your appointments with your specialists as you may have planned  Please go to the LAB at the blood drawing area for the tests to be done  You will be contacted by phone if any changes need to be made immediately.  Otherwise, you will receive a letter about your results with an explanation, but please check with MyChart first.  Please remember to sign up for MyChart if you have not done so, as this will be important to you in the future with finding out test results, communicating by private email, and scheduling acute appointments online when needed.  Please make an Appointment to return in 6 months, or sooner if needed

## 2022-04-30 NOTE — Progress Notes (Unsigned)
Patient ID: [name] [name], male   DOB: 07-11-1946, 76 y.o.   MRN: [id number]        Chief Complaint: follow up HTN, HLD and hyperglycemia, low b12       HPI:  [name] [name] is a 76 y.o. male here with daughter, due for b12 shot today and monthly. Pt denies chest pain, increased sob or doe, wheezing, orthopnea, PND, increased LE swelling, palpitations, dizziness or syncope.  Torsemide was increased x 2 mo and much improved DOE.   Pt denies polydipsia, polyuria, or new focal neuro s/s.    Pt denies fever, wt loss, night sweats, loss of appetite, or other constitutional symptoms  Has eye exam scheduled for next wk.  Due for flu shot  Wt Readings from Last 3 Encounters:  04/30/22 279 lb (126.6 kg)  04/09/22 272 lb (123.4 kg)  03/19/22 276 lb 6.4 oz (125.4 kg)   BP Readings from Last 3 Encounters:  04/30/22 124/86  04/09/22 135/79  03/19/22 114/66         Past Medical History:  Diagnosis Date   Acute right-sided CHF (congestive heart failure) ([place])    a. 01/2013.   Arthritis    "knees and hands; thoracic area of the spine" (01/05/2013)   BRONCHITIS, CHRONIC 01/02/2009   COMMON MIGRAINE    "none since treating for high BP"   Complication of anesthesia    "aspiration pneumonia after hand OR" (01/05/2013)   DIABETES MELLITUS, TYPE II    GOUT    HYPERLIPIDEMIA    HYPERTENSION    Long term (current) use of anticoagulants    Morbid obesity (HCC)    OBESITY HYPOVENTILATION SYNDROME    a. on home O2.   On home oxygen therapy    OSA (obstructive sleep apnea)    Permanent atrial fibrillation ([place])    PROSTATE SPECIFIC ANTIGEN, ELEVATED 06/09/2007   Pulmonary HTN ([place])    a. multifactorial including obstructive sleep apnea, obesity hypoventilation syndrome and possible pulmonary venous hypertension.   SKIN RASH 03/12/2010   Unspecified hearing loss    Past Surgical History:  Procedure Laterality Date   APPENDECTOMY  1974   CARDIOVERSION  05/21/2008; ~ 06/2008   HERNIA REPAIR     INGUINAL HERNIA  REPAIR Right    RIGHT HEART CATHETERIZATION N/A 01/08/2013   Procedure: RIGHT HEART CATH;  Surgeon: Thayer Headings, MD;  Location: [facility] CATH LAB;  Service: Cardiovascular;  Laterality: N/A;   TENDON REPAIR Right 2009   "lacerated his tendon and pulley" Dr. Lenon Curt   UMBILICAL HERNIA REPAIR      reports that he quit smoking about 43 years ago. His smoking use included cigarettes. He has a 15.00 pack-year smoking history. He has never used smokeless tobacco. He reports current alcohol use. He reports that he does not use drugs. family history includes Alzheimer's disease in his father; Cancer in an other family member; Coronary artery disease in an other family member; Prostate cancer in his father. Allergies  Allergen Reactions   Codeine     Altered mental status "goofy"   Heparin Rash     Abdominal rash 01/2013   Current Outpatient Medications on File Prior to Visit  Medication Sig Dispense Refill   allopurinol (ZYLOPRIM) 100 MG tablet TAKE 1 TABLET DAILY 90 tablet 3   atorvastatin (LIPITOR) 20 MG tablet TAKE 1 TABLET DAILY 90 tablet 3   cetirizine (ZYRTEC) 10 MG tablet Take 10 mg by mouth at bedtime.     cholecalciferol (  VITAMIN D3) 25 MCG (1000 UNIT) tablet Take 2,000 Units by mouth daily.     cyanocobalamin (,VITAMIN B-12,) 1000 MCG/ML injection Inject 1,000 mcg into the muscle.     diltiazem (CARDIZEM CD) 180 MG 24 hr capsule TAKE 1 CAPSULE DAILY (NEED OFFICE VISIT) 90 capsule 3   diltiazem (TIAZAC) 180 MG 24 hr capsule      ELIQUIS 5 MG TABS tablet TAKE 1 TABLET TWICE A DAY 180 tablet 3   glipiZIDE (GLUCOTROL XL) 10 MG 24 hr tablet Take 1 tablet (10 mg total) by mouth daily with breakfast. 90 tablet 3   glucose blood (ONE TOUCH ULTRA TEST) test strip Use to check blood sugars once daily Dx E11.9 100 each 2   Lancets MISC Use as directed once daily  E11.9 100 each 12   metFORMIN (GLUCOPHAGE-XR) 500 MG 24 hr tablet TAKE 2 TABLETS IN THE MORNING 180 tablet 3   metoprolol tartrate  (LOPRESSOR) 25 MG tablet Take 1 tablet (25 mg total) by mouth 2 (two) times daily. 180 tablet 3   Misc Natural Products (TART CHERRY ADVANCED) CAPS Take 1 capsule by mouth daily.     OXYGEN Inhale 3-5 L into the lungs daily. 3L Daily  4L Resting  5L Exertion     potassium chloride SA (KLOR-CON) 20 MEQ tablet      tizanidine (ZANAFLEX) 2 MG capsule Take 1 capsule (2 mg total) by mouth 3 (three) times daily. 30 capsule 0   Torsemide 60 MG TABS Monday, Wednesday,Friday Take 80 mg Twice Daily. On Alternate Days Take 60 mg Twice Daily 180 tablet 3   Zoster Vaccine Adjuvanted [facility]) injection      Current Facility-Administered Medications on File Prior to Visit  Medication Dose Route Frequency Provider Last Rate Last Admin   cyanocobalamin ((VITAMIN B-12)) injection 1,000 mcg  1,000 mcg Intramuscular Q30 days [name], MD   1,000 mcg at 08/06/21 0947        ROS:  All others reviewed and negative.  Objective        PE:  BP 124/86   Pulse 60   Temp 98.3 F (36.8 C)   Ht '5\' 8"'$  (1.727 m)   Wt 279 lb (126.6 kg)   SpO2 95%   BMI 42.42 kg/m                 Constitutional: Pt appears in NAD               HENT: Head: NCAT.                Right Ear: External ear normal.                 Left Ear: External ear normal.                Eyes: . Pupils are equal, round, and reactive to light. Conjunctivae and EOM are normal               Nose: without d/c or deformity               Neck: Neck supple. Gross normal ROM               Cardiovascular: Normal rate and regular rhythm.                 Pulmonary/Chest: Effort normal and breath sounds without rales or wheezing.  Abd:  Soft, NT, ND, + BS, no organomegaly               Neurological: Pt is alert. At baseline orientation, motor grossly intact               Skin: Skin is warm. No rashes, no other new lesions, LE edema - chroniic 1-2+ to knees               Psychiatric: Pt behavior is normal without agitation   Micro:  none  Cardiac tracings I have personally interpreted today:  none  Pertinent Radiological findings (summarize): none   Lab Results  Component Value Date   WBC 10.7 (H) 04/30/2022   HGB 14.2 04/30/2022   HCT 43.4 04/30/2022   PLT 237.0 04/30/2022   GLUCOSE 99 04/30/2022   CHOL 115 04/30/2022   TRIG 158.0 (H) 04/30/2022   HDL 42.60 04/30/2022   LDLCALC 41 04/30/2022   ALT 11 04/30/2022   AST 12 04/30/2022   NA 146 (H) 04/30/2022   K 3.7 04/30/2022   CL 103 04/30/2022   CREATININE 1.23 04/30/2022   BUN 20 04/30/2022   CO2 36 (H) 04/30/2022   TSH 2.41 04/30/2022   PSA 1.62 10/15/2021   INR 1.25 01/09/2013   HGBA1C 6.4 10/15/2021   MICROALBUR 9.7 (H) 10/15/2021   Assessment/Plan:  [name] [name] is a 76 y.o. White or Caucasian [1] male with  has a past medical history of Acute right-sided CHF (congestive heart failure) ([place]), Arthritis, BRONCHITIS, CHRONIC (01/02/2009), COMMON MIGRAINE, Complication of anesthesia, DIABETES MELLITUS, TYPE II, GOUT, HYPERLIPIDEMIA, HYPERTENSION, Long term (current) use of anticoagulants, Morbid obesity ([place]), OBESITY HYPOVENTILATION SYNDROME, On home oxygen therapy, OSA (obstructive sleep apnea), Permanent atrial fibrillation ([place]), PROSTATE SPECIFIC ANTIGEN, ELEVATED (06/09/2007), Pulmonary HTN ([place]), SKIN RASH (03/12/2010), and Unspecified hearing loss.  Essential hypertension BP Readings from Last 3 Encounters:  04/30/22 124/86  04/09/22 135/79  03/19/22 114/66   Stable, pt to continue medical treatment lopressor 25 bid, card CD 180 qd   Hyperlipidemia Lab Results  Component Value Date   LDLCALC 41 04/30/2022   Stable, pt to continue current statin lipitor 20 mg qd   Non-insulin dependent type 2 diabetes mellitus ([place]) Lab Results  Component Value Date   HGBA1C 6.4 10/15/2021   Stable, pt to continue current medical treatment glucotrol xl 10 qd, metformin ER 500 2 qam    Vitamin D deficiency Last vitamin D Lab Results  Component  Value Date   VD25OH 61.62 04/30/2022   Stable, cont oral replacement   Chronic diastolic CHF (congestive heart failure) (HCC) Stable volume,  to f/u any worsening symptoms or concerns  Followup: Return in about 6 months (around 10/29/2022).  Cathlean Cower, MD 05/02/2022 2:26 PM [facility]

## 2022-05-02 ENCOUNTER — Encounter: Payer: Self-pay | Admitting: Internal Medicine

## 2022-05-02 NOTE — Assessment & Plan Note (Signed)
Lab Results  Component Value Date   HGBA1C 6.4 10/15/2021   Stable, pt to continue current medical treatment glucotrol xl 10 qd, metformin ER 500 2 qam

## 2022-05-02 NOTE — Assessment & Plan Note (Signed)
Last vitamin D Lab Results  Component Value Date   VD25OH 61.62 04/30/2022   Stable, cont oral replacement  

## 2022-05-02 NOTE — Assessment & Plan Note (Signed)
BP Readings from Last 3 Encounters:  04/30/22 124/86  04/09/22 135/79  03/19/22 114/66   Stable, pt to continue medical treatment lopressor 25 bid, card CD 180 qd

## 2022-05-02 NOTE — Assessment & Plan Note (Signed)
Stable volume,  to f/u any worsening symptoms or concerns

## 2022-05-02 NOTE — Assessment & Plan Note (Signed)
Lab Results  Component Value Date   LDLCALC 41 04/30/2022   Stable, pt to continue current statin lipitor 20 mg qd  

## 2022-05-03 LAB — HEMOGLOBIN A1C: Hgb A1c MFr Bld: 6.7 % — ABNORMAL HIGH (ref 4.6–6.5)

## 2022-05-05 ENCOUNTER — Telehealth: Payer: Self-pay | Admitting: Cardiology

## 2022-05-05 NOTE — Telephone Encounter (Signed)
Pharmacy needs a verbal from nurse about medication

## 2022-05-05 NOTE — Telephone Encounter (Signed)
Received call from Randall Hiss at [facility] to verify torsemide instructions. He will give medication with refills for a year. Patient will receive torsemide '60mg'$  tablets and '20mg'$  tablets.

## 2022-05-07 DIAGNOSIS — Z961 Presence of intraocular lens: Secondary | ICD-10-CM | POA: Diagnosis not present

## 2022-05-07 DIAGNOSIS — H35372 Puckering of macula, left eye: Secondary | ICD-10-CM | POA: Diagnosis not present

## 2022-05-21 ENCOUNTER — Encounter: Payer: Self-pay | Admitting: Podiatry

## 2022-05-21 ENCOUNTER — Ambulatory Visit (INDEPENDENT_AMBULATORY_CARE_PROVIDER_SITE_OTHER): Payer: Medicare Other | Admitting: Podiatry

## 2022-05-21 DIAGNOSIS — M79675 Pain in left toe(s): Secondary | ICD-10-CM | POA: Diagnosis not present

## 2022-05-21 DIAGNOSIS — B351 Tinea unguium: Secondary | ICD-10-CM

## 2022-05-21 DIAGNOSIS — M79674 Pain in right toe(s): Secondary | ICD-10-CM

## 2022-05-21 NOTE — Progress Notes (Signed)
Subjective: [name] [name] is a pleasant 76 y.o. male patient seen today for preventative diabetic foot care and painful elongated mycotic toenails 1-5 bilaterally which are tender when wearing enclosed shoe gear. Pain is relieved with periodic professional debridement.   Patient is inquiring about a new pair of diabetic shoes as his shoes are a couple of years old.  PCP is [name], MD. Last visit was: 10/15/2021  Allergies  Allergen Reactions   Codeine     Altered mental status "goofy"   Heparin Rash     Abdominal rash 01/2013    Objective: Physical Exam  General: [name] [name] is a pleasant 76 y.o. Caucasian male, morbidly obese in NAD. AAO x 3.   Vascular:  Capillary fill time to digits <3 seconds b/l lower extremities. Palpable pedal pulses b/l LE. Pedal hair sparse. Lower extremity skin temperature gradient within normal limits. No pain with calf compression b/l. Nonpitting edema noted b/l lower extremities.  Dermatological:  Pedal skin with normal turgor, texture and tone bilaterally. No open wounds bilaterally. No interdigital macerations bilaterally. Toenails 1-5 b/l elongated, discolored, dystrophic, thickened, crumbly with subungual debris and tenderness to dorsal palpation.  Musculoskeletal:  Normal muscle strength 5/5 to all lower extremity muscle groups bilaterally. No pain crepitus or joint limitation noted with ROM b/l. Hallux valgus with bunion deformity noted b/l lower extremities. Hammertoe(s) noted to the L 2nd toe. Pes planus deformity noted b/l.   Neurological:  Protective sensation intact 5/5 intact bilaterally with 10g monofilament b/l. Vibratory sensation intact b/l.  Assessment and Plan:  No diagnosis found.  -Examined patient. -Continue diabetic foot care principles: inspect feet daily, monitor glucose as recommended by PCP and/or Endocrinologist, and follow prescribed diet per PCP, Endocrinologist and/or dietician. -Patient to continue soft, supportive shoe  gear daily. Order entered for one pair extra depth shoes and 3 pair heat moldable insoles. Patient qualifies based on diagnoses. -Toenails 1-5 b/l were debrided in length and girth with sterile nail nippers and dremel without iatrogenic bleeding.  -Patient to report any pedal injuries to medical professional immediately. -Patient/POA to call should there be question/concern in the interim.  No follow-ups on file.  Felipa Furnace, DPM

## 2022-05-24 DIAGNOSIS — J9610 Chronic respiratory failure, unspecified whether with hypoxia or hypercapnia: Secondary | ICD-10-CM | POA: Diagnosis not present

## 2022-05-31 ENCOUNTER — Telehealth: Payer: Self-pay | Admitting: Internal Medicine

## 2022-05-31 NOTE — Telephone Encounter (Signed)
LVM for pt to rtn my call to schedule AWV with [facility] call back # [phone] 

## 2022-06-04 ENCOUNTER — Ambulatory Visit: Payer: Medicare Other

## 2022-06-08 DIAGNOSIS — J42 Unspecified chronic bronchitis: Secondary | ICD-10-CM | POA: Diagnosis not present

## 2022-06-08 DIAGNOSIS — G4733 Obstructive sleep apnea (adult) (pediatric): Secondary | ICD-10-CM | POA: Diagnosis not present

## 2022-06-24 DIAGNOSIS — J9610 Chronic respiratory failure, unspecified whether with hypoxia or hypercapnia: Secondary | ICD-10-CM | POA: Diagnosis not present

## 2022-06-25 ENCOUNTER — Encounter: Payer: Self-pay | Admitting: Nurse Practitioner

## 2022-06-25 ENCOUNTER — Other Ambulatory Visit: Payer: Self-pay

## 2022-06-25 ENCOUNTER — Telehealth: Payer: Self-pay

## 2022-06-25 ENCOUNTER — Ambulatory Visit (INDEPENDENT_AMBULATORY_CARE_PROVIDER_SITE_OTHER): Payer: Medicare Other | Admitting: Nurse Practitioner

## 2022-06-25 VITALS — BP 130/78 | HR 62 | Temp 98.3°F | Ht 68.0 in | Wt 276.8 lb

## 2022-06-25 DIAGNOSIS — G4733 Obstructive sleep apnea (adult) (pediatric): Secondary | ICD-10-CM

## 2022-06-25 DIAGNOSIS — J9612 Chronic respiratory failure with hypercapnia: Secondary | ICD-10-CM | POA: Diagnosis not present

## 2022-06-25 DIAGNOSIS — J9611 Chronic respiratory failure with hypoxia: Secondary | ICD-10-CM

## 2022-06-25 NOTE — Assessment & Plan Note (Signed)
Excellent compliance with nightly usage. He is having some large leaks over the past few weeks with breakthrough events. AHI 5. Leaks possibly related to higher IPAP pressures based on download information. We will adjust his IPAP Fadi to 16 cmH2O. EPAP will remain at 8 cmH2O. Advised him to make sure he is changing out his mask regularly. May consider mask fitting if he continues to have trouble with leaks. We will reassess download in 6 weeks.   Patient Instructions  Continue BiPAP nightly, minimum of 4-6 hours, with oxygen 4 lpm bled through. You're doing a great job using it!! Continue supplemental oxygen 4-5 lpm during the day, goal oxygen >88-90% Continue torsemide as directed by cardiology    Follow up in 6 months with Dr. Halford Chessman or Katie Rocsi Hazelbaker,NP. If symptoms do not improve or worsen, please contact office for sooner follow up or seek emergency care.

## 2022-06-25 NOTE — Progress Notes (Signed)
Reviewed and agree with assessment/plan.   Jamie Hafford, MD [facility] 06/25/2022, 4:32 PM Pager:  [phone]  

## 2022-06-25 NOTE — Assessment & Plan Note (Signed)
Stable without increased O2 requirement. Goal >88-90% 

## 2022-06-25 NOTE — Progress Notes (Signed)
$'@Patient'L$  ID: [name] [name], male    DOB: 1946/04/11, 76 y.o.   MRN: [id number]  Chief Complaint  Patient presents with   Follow-up    No new issues since LOV.    Referring provider: [name], MD  HPI: 76 year old male, former smoker (quit 1980) followed for severe OSA on BiPAP and chronic hypoxic/hypercapnic respiratory failure secondary to obesity hypoventilation syndrome. He is a patient of Dr. Juanetta Gosling and last seen in office on 03/19/2022 by [facility] NP. Past medical history CHF, PAF, HTN, allergic rhinitis, DM, HLD, morbid obesity.   TEST/EVENTS:  02/09/2008 ABG: pH 7.37, pCO2 55.1, pO2 76.4 06/18/2008 PSG: AHI 64>> BiPAP 18/9 with 4 L oxygen 04/03/2009 spirometry: FEV1 2.08 (69%), FEV1% 77 01/18/2013 Ono on BiPAP: Basal SpO2 91.7%, low SpO2 83%; spent 32 minutes with SpO2 less than 88% 07/31/2019 BiPAP titration: BiPAP 16/12 with 4 L supplemental O2  07/19/2019: OV with Dr. Halford Chessman.  He was told he needed a follow-up visit for oxygen and BiPAP renewal.  Ambulatory oximetry today on room air; dropped to 86% and recovered to 90s with addition supplemental oxygen 4 L pulsed.  BiPAP titration study ordered for BiPAP/oxygen renewal.  Excellent compliance with BiPAP.  03/19/2022: OV with Lariyah Shetterly NP for overdue follow-up.  They also had some concerns regarding his oxygen.  Reported that about a month ago he was dropping into the mid to high 80s, despite his 5 L pulsed.  They increased him to 6 L which seemed to correct this.  At the same time, he was also noted to have increased weight gain and increased shortness of breath.  He was seen by cardiology, who adjusted his torsemide with has corrected his volume status.  Today, he reports feeling better.  Feels like his breathing is at his baseline.  Has not had any more issues with oxygen dropping into the 80s at home.  Walking oximetry at OV - able to maintain saturations on 4 lpm POC. Excellent compliance with BiPAP.  06/25/2022: Today - follow up Patient  presents today for follow-up with his daughter.  He has been doing well since he was here last.  No further issues with weight gain or lower extremity swelling.  He also feels like his breathing has been stable.  He has been using 4 L/min supplemental O2, which has been his baseline.  No increased cough, chest congestion, orthopnea, PND.  He is wearing BiPAP nightly without fail.  No current issues.  Does feel well controlled on this.  05/25/2022-06/23/2022 BiPAP auto IPAP Joelle 20, EPAP min 8, PS 4 30/30 days; 100% >4 hr; av use 13 hours Leaks median 0, 95th 76.9 IPAP pressure 95th 14.9, EPAP 10.9 AHI 5  Allergies  Allergen Reactions   Codeine     Altered mental status "goofy"   Heparin Rash     Abdominal rash 01/2013    Immunization History  Administered Date(s) Administered   Fluad Quad(high Dose 65+) 05/10/2019, 06/06/2020, 04/16/2021, 04/30/2022   H1N1 07/15/2008   Influenza Split 05/17/2011, 06/09/2012   Influenza Whole 05/09/2008, 06/24/2009, 06/26/2010   Influenza, High Dose Seasonal PF 04/15/2015, 04/19/2017, 04/25/2018   Influenza,inj,Quad PF,6+ Mos 04/05/2013, 04/11/2014, 04/13/2016   PFIZER(Purple Top)SARS-COV-2 Vaccination 10/07/2019, 10/31/2019, 08/30/2020   Pneumococcal Conjugate-13 06/01/2013, 10/09/2013   Pneumococcal Polysaccharide-23 02/07/2008, 10/19/2017   Td 02/07/2008   Tdap 04/25/2018   Zoster Recombinat (Shingrix) 04/06/2019   Zoster, Live 07/02/2008    Past Medical History:  Diagnosis Date   Acute right-sided  CHF (congestive heart failure) ([place])    a. 01/2013.   Arthritis    "knees and hands; thoracic area of the spine" (01/05/2013)   BRONCHITIS, CHRONIC 01/02/2009   COMMON MIGRAINE    "none since treating for high BP"   Complication of anesthesia    "aspiration pneumonia after hand OR" (01/05/2013)   DIABETES MELLITUS, TYPE II    GOUT    HYPERLIPIDEMIA    HYPERTENSION    Long term (current) use of anticoagulants    Morbid obesity (HCC)    OBESITY  HYPOVENTILATION SYNDROME    a. on home O2.   On home oxygen therapy    OSA (obstructive sleep apnea)    Permanent atrial fibrillation ([place])    PROSTATE SPECIFIC ANTIGEN, ELEVATED 06/09/2007   Pulmonary HTN ([place])    a. multifactorial including obstructive sleep apnea, obesity hypoventilation syndrome and possible pulmonary venous hypertension.   SKIN RASH 03/12/2010   Unspecified hearing loss     Tobacco History: Social History   Tobacco Use  Smoking Status Former   Packs/day: 1.00   Years: 15.00   Total pack years: 15.00   Types: Cigarettes   Quit date: 08/09/1978   Years since quitting: 43.9  Smokeless Tobacco Never  Tobacco Comments   01/05/2013 "quit smoking ~ 40 years ago"   Counseling given: Not Answered Tobacco comments: 01/05/2013 "quit smoking ~ 40 years ago"   Outpatient Medications Prior to Visit  Medication Sig Dispense Refill   allopurinol (ZYLOPRIM) 100 MG tablet TAKE 1 TABLET DAILY 90 tablet 3   atorvastatin (LIPITOR) 20 MG tablet TAKE 1 TABLET DAILY 90 tablet 3   cetirizine (ZYRTEC) 10 MG tablet Take 10 mg by mouth at bedtime.     cholecalciferol (VITAMIN D3) 25 MCG (1000 UNIT) tablet Take 2,000 Units by mouth daily.     cyanocobalamin (,VITAMIN B-12,) 1000 MCG/ML injection Inject 1,000 mcg into the muscle.     diltiazem (CARDIZEM CD) 180 MG 24 hr capsule TAKE 1 CAPSULE DAILY (NEED OFFICE VISIT) 90 capsule 3   diltiazem (TIAZAC) 180 MG 24 hr capsule      ELIQUIS 5 MG TABS tablet TAKE 1 TABLET TWICE A DAY 180 tablet 3   glipiZIDE (GLUCOTROL XL) 10 MG 24 hr tablet Take 1 tablet (10 mg total) by mouth daily with breakfast. 90 tablet 3   glucose blood (ONE TOUCH ULTRA TEST) test strip Use to check blood sugars once daily Dx E11.9 100 each 2   Lancets MISC Use as directed once daily  E11.9 100 each 12   metFORMIN (GLUCOPHAGE-XR) 500 MG 24 hr tablet TAKE 2 TABLETS IN THE MORNING 180 tablet 3   metoprolol tartrate (LOPRESSOR) 25 MG tablet Take 1 tablet (25 mg total) by  mouth 2 (two) times daily. 180 tablet 3   Misc Natural Products (TART CHERRY ADVANCED) CAPS Take 1 capsule by mouth daily.     OXYGEN Inhale 3-5 L into the lungs daily. 3L Daily  4L Resting  5L Exertion     potassium chloride SA (KLOR-CON) 20 MEQ tablet      tizanidine (ZANAFLEX) 2 MG capsule Take 1 capsule (2 mg total) by mouth 3 (three) times daily. 30 capsule 0   Torsemide 60 MG TABS Monday, Wednesday,Friday Take 80 mg Twice Daily. On Alternate Days Take 60 mg Twice Daily 180 tablet 3   Zoster Vaccine Adjuvanted [facility]) injection      Facility-Administered Medications Prior to Visit  Medication Dose Route Frequency Provider Last Rate  Last Admin   cyanocobalamin ((VITAMIN B-12)) injection 1,000 mcg  1,000 mcg Intramuscular Q30 days [name], MD   1,000 mcg at 08/06/21 6948     Review of Systems:   Constitutional: No weight loss or gain, night sweats, fevers, chills, fatigue, or lassitude. HEENT: No headaches, difficulty swallowing, tooth/dental problems, or sore throat. No sneezing, itching, ear ache, nasal congestion, or post nasal drip CV:  +swelling in lower extremities (baseline). No chest pain, orthopnea, PND, anasarca, dizziness, palpitations, syncope Resp: +shortness of breath with exertion (baseline). No excess mucus or change in color of mucus. No productive or non-productive. No hemoptysis. No wheezing.  No chest wall deformity Skin: No rash, lesions, ulcerations MSK:  No joint pain or swelling.  No decreased range of motion.  No back pain. Neuro: No dizziness or lightheadedness.  Psych: No depression or anxiety. Mood stable.     Physical Exam:  BP 130/78 (BP Location: Right Arm, Patient Position: Sitting, Cuff Size: Normal)   Pulse 62   Temp 98.3 F (36.8 C) (Oral)   Ht '5\' 8"'$  (1.727 m)   Wt 276 lb 12.8 oz (125.6 kg)   SpO2 94%   BMI 42.09 kg/m   GEN: Pleasant, interactive, chronically-ill appearing; morbidly obese; in no acute distress. HEENT:   Normocephalic and atraumatic. PERRLA. Sclera white. Nasal turbinates pink, moist and patent bilaterally. No rhinorrhea present. Oropharynx pink and moist, without exudate or edema. No lesions, ulcerations, or postnasal drip.  NECK:  Supple w/ fair ROM. No JVD present.  CV: RRR, no m/r/g, dependent BLE edema. Pulses intact, +2 bilaterally. No cyanosis, pallor or clubbing. PULMONARY:  Unlabored, regular breathing. Clear bilaterally A&P w/o wheezes/rales/rhonchi. No accessory muscle use. No dullness to percussion. GI: BS present and normoactive. Soft, non-tender to palpation.  MSK: No deformities or joint swelling noted. Stasis dermatitis  Neuro: A/Ox3. No focal deficits noted.   Skin: Warm, no lesions or rashe Psych: Normal affect and behavior. Judgement and thought content appropriate.     Lab Results:  CBC    Component Value Date/Time   WBC 10.7 (H) 04/30/2022 1101   RBC 4.99 04/30/2022 1101   HGB 14.2 04/30/2022 1101   HGB 14.2 02/26/2022 0956   HCT 43.4 04/30/2022 1101   HCT 44.1 02/26/2022 0956   PLT 237.0 04/30/2022 1101   PLT 276 02/26/2022 0956   MCV 87.0 04/30/2022 1101   MCV 88 02/26/2022 0956   MCH 28.5 02/26/2022 0956   MCH 28.6 04/11/2020 1409   MCHC 32.6 04/30/2022 1101   RDW 16.3 (H) 04/30/2022 1101   RDW 14.1 02/26/2022 0956   LYMPHSABS 2.7 04/30/2022 1101   MONOABS 0.9 04/30/2022 1101   EOSABS 0.2 04/30/2022 1101   BASOSABS 0.1 04/30/2022 1101    BMET    Component Value Date/Time   NA 146 (H) 04/30/2022 1101   NA 143 02/26/2022 0956   K 3.7 04/30/2022 1101   CL 103 04/30/2022 1101   CO2 36 (H) 04/30/2022 1101   GLUCOSE 99 04/30/2022 1101   BUN 20 04/30/2022 1101   BUN 19 02/26/2022 0956   CREATININE 1.23 04/30/2022 1101   CREATININE 1.23 (H) 04/11/2020 1409   CALCIUM 9.9 04/30/2022 1101   GFRNONAA 70 04/28/2020 0927   GFRNONAA 57 (L) 04/11/2020 1409   GFRAA 81 04/28/2020 0927   GFRAA 67 04/11/2020 1409    BNP    Component Value Date/Time    BNP 197.0 (H) 12/23/2015 1531   BNP 103.7 (H) 07/22/2014 1025  Imaging:  No results found.  cyanocobalamin (VITAMIN B12) injection 1,000 mcg     Date Action Dose Route User   04/30/2022 1119 Given 1,000 mcg Intramuscular (Right Deltoid) Elta Guadeloupe, CMA           No data to display          No results found for: "NITRICOXIDE"      Assessment & Plan:   OSA treated with BiPAP Excellent compliance with nightly usage. He is having some large leaks over the past few weeks with breakthrough events. AHI 5. Leaks possibly related to higher IPAP pressures based on download information. We will adjust his IPAP Amario to 16 cmH2O. EPAP will remain at 8 cmH2O. Advised him to make sure he is changing out his mask regularly. May consider mask fitting if he continues to have trouble with leaks. We will reassess download in 6 weeks.   Patient Instructions  Continue BiPAP nightly, minimum of 4-6 hours, with oxygen 4 lpm bled through. You're doing a great job using it!! Continue supplemental oxygen 4-5 lpm during the day, goal oxygen >88-90% Continue torsemide as directed by cardiology    Follow up in 6 months with Dr. Halford Chessman or Katie Alena Blankenbeckler,NP. If symptoms do not improve or worsen, please contact office for sooner follow up or seek emergency care.    Chronic respiratory failure with hypoxia and hypercapnia related to obesity hypoventilation syndrome Stable without increased O2 requirement. Goal >88-90%    I spent 28 minutes of dedicated to the care of this patient on the date of this encounter to include pre-visit review of records, face-to-face time with the patient discussing conditions above, post visit ordering of testing, clinical documentation with the electronic health record, making appropriate referrals as documented, and communicating necessary findings to members of the patients care team.  Clayton Bibles, NP 06/25/2022  Pt aware and understands NP's role.

## 2022-06-25 NOTE — Telephone Encounter (Signed)
Called Pt to inform that he is having alot of leakage from his BiPap machine. We are changing the settings on BiPap machine and asked that he use a new mask. We will recheck BiPap compliance at next Office Visit. Pt's daughter stated understanding. Nothing further needed.

## 2022-06-25 NOTE — Patient Instructions (Addendum)
Continue BiPAP nightly, minimum of 4-6 hours, with oxygen 4 lpm bled through. You're doing a great job using it!! Continue supplemental oxygen 4-5 lpm during the day, goal oxygen >88-90% Continue torsemide as directed by cardiology    Follow up in 6 months with Dr. Halford Chessman or Katie Nastacia Raybuck,NP. If symptoms do not improve or worsen, please contact office for sooner follow up or seek emergency care.

## 2022-07-11 ENCOUNTER — Other Ambulatory Visit: Payer: Self-pay | Admitting: Internal Medicine

## 2022-07-11 NOTE — Telephone Encounter (Signed)
Please refill as per office routine med refill policy (all routine meds to be refilled for 3 mo or monthly (per pt preference) up to one year from last visit, then month to month grace period for 3 mo, then further med refills will have to be denied) ? ?

## 2022-07-14 NOTE — Progress Notes (Signed)
HPI: FU permanent AFib and right side CHF. He has failed DCCV in the past. Myoview 7/09: EF 54%, normal perfusion. Holter 09/2010: AFib with mildly decreased rate. Right heart failure felt secondary to pulmonary hypertension which is multifactorial including OSA, OHS and possible pulmonary venous hypertension. [place] 01/08/13 demonstrated elevated R heart pressures (RA 15, RV 44/11, PCWP 21, PA 41/30, CO 4.49, CI 1.8, PA sat 61%). Patient has had some degree of renal insufficiency and hyperkalemia on a combination of ACE inhibitor and spironolactone. Last echo 12/22 showed normal LV function, moderate biatrial enlargement. Since last seen he has dyspnea on exertion which is chronic.  He is on 6 L of O2.  He also has chronic mild orthopnea and chronic pedal edema which is unchanged.  He denies chest pain, palpitations, syncope or bleeding.  Current Outpatient Medications  Medication Sig Dispense Refill   allopurinol (ZYLOPRIM) 100 MG tablet TAKE 1 TABLET DAILY 90 tablet 3   atorvastatin (LIPITOR) 20 MG tablet TAKE 1 TABLET DAILY 90 tablet 3   cetirizine (ZYRTEC) 10 MG tablet Take 10 mg by mouth at bedtime.     cholecalciferol (VITAMIN D3) 25 MCG (1000 UNIT) tablet Take 2,000 Units by mouth daily.     cyanocobalamin (,VITAMIN B-12,) 1000 MCG/ML injection Inject 1,000 mcg into the muscle.     diltiazem (CARDIZEM CD) 180 MG 24 hr capsule TAKE 1 CAPSULE DAILY (NEED OFFICE VISIT) 90 capsule 3   diltiazem (TIAZAC) 180 MG 24 hr capsule      ELIQUIS 5 MG TABS tablet TAKE 1 TABLET TWICE A DAY 180 tablet 3   glipiZIDE (GLUCOTROL XL) 10 MG 24 hr tablet Take 1 tablet (10 mg total) by mouth daily with breakfast. 90 tablet 3   glucose blood (ONE TOUCH ULTRA TEST) test strip Use to check blood sugars once daily Dx E11.9 100 each 2   Lancets MISC Use as directed once daily  E11.9 100 each 12   metFORMIN (GLUCOPHAGE-XR) 500 MG 24 hr tablet TAKE 2 TABLETS IN THE MORNING 180 tablet 3   metoprolol tartrate (LOPRESSOR)  25 MG tablet TAKE 1 TABLET(25 MG) BY MOUTH TWICE DAILY 180 tablet 3   Misc Natural Products (TART CHERRY ADVANCED) CAPS Take 1 capsule by mouth daily.     OXYGEN Inhale 3-5 L into the lungs daily. 3L Daily  4L Resting  5L Exertion     potassium chloride SA (KLOR-CON) 20 MEQ tablet      tizanidine (ZANAFLEX) 2 MG capsule Take 1 capsule (2 mg total) by mouth 3 (three) times daily. 30 capsule 0   Torsemide 60 MG TABS Monday, Wednesday,Friday Take 80 mg Twice Daily. On Alternate Days Take 60 mg Twice Daily 180 tablet 3   Zoster Vaccine Adjuvanted [facility]) injection      Current Facility-Administered Medications  Medication Dose Route Frequency Provider Last Rate Last Admin   cyanocobalamin ((VITAMIN B-12)) injection 1,000 mcg  1,000 mcg Intramuscular Q30 days [name], MD   1,000 mcg at 08/06/21 9470     Past Medical History:  Diagnosis Date   Acute right-sided CHF (congestive heart failure) ([place])    a. 01/2013.   Arthritis    "knees and hands; thoracic area of the spine" (01/05/2013)   BRONCHITIS, CHRONIC 01/02/2009   COMMON MIGRAINE    "none since treating for high BP"   Complication of anesthesia    "aspiration pneumonia after hand OR" (01/05/2013)   DIABETES MELLITUS, TYPE II  GOUT    HYPERLIPIDEMIA    HYPERTENSION    Long term (current) use of anticoagulants    Morbid obesity ([place])    OBESITY HYPOVENTILATION SYNDROME    a. on home O2.   On home oxygen therapy    OSA (obstructive sleep apnea)    Permanent atrial fibrillation ([place])    PROSTATE SPECIFIC ANTIGEN, ELEVATED 06/09/2007   Pulmonary HTN ([place])    a. multifactorial including obstructive sleep apnea, obesity hypoventilation syndrome and possible pulmonary venous hypertension.   SKIN RASH 03/12/2010   Unspecified hearing loss     Past Surgical History:  Procedure Laterality Date   APPENDECTOMY  1974   CARDIOVERSION  05/21/2008; ~ 06/2008   HERNIA REPAIR     INGUINAL HERNIA REPAIR Right    RIGHT HEART  CATHETERIZATION N/A 01/08/2013   Procedure: RIGHT HEART CATH;  Surgeon: Thayer Headings, MD;  Location[facility] C[facility] LAB;  Service: Cardiovascular;  Laterality: N/A;   TENDON REPAIR Right 2009   "lacerated his tendon and pulley" Dr. Lenon Curt   UMBILICAL HERNIA REPAIR      Social History   Socioeconomic History   Marital status: Widowed    Spouse name: Not on file   Number of children: 2   Years of education: Not on file   Highest education level: Not on file  Occupational History   Occupation: Engineer, agricultural    Employer: [employer]  Tobacco Use   Smoking status: Former    Packs/day: 1.00    Years: 15.00    Total pack years: 15.00    Types: Cigarettes    Quit date: 08/09/1978    Years since quitting: 43.9   Smokeless tobacco: Never   Tobacco comments:    01/05/2013 "quit smoking ~ 40 years ago"  Vaping Use   Vaping Use: Never used  Substance and Sexual Activity   Alcohol use: Yes    Alcohol/week: 0.0 standard drinks of alcohol    Comment: 01/05/2013 "hasn't had a drink since 1980's; never had problem w/it"   Drug use: No   Sexual activity: Not Currently  Other Topics Concern   Not on file  Social History Narrative   Not on file   Social Determinants of Health   Financial Resource Strain: Not on file  Food Insecurity: Not on file  Transportation Needs: Not on file  Physical Activity: Not on file  Stress: Not on file  Social Connections: Not on file  Intimate Partner Violence: Not on file    Family History  Problem Relation Age of Onset   Alzheimer's disease Father    Prostate cancer Father    Cancer Other        Breast Cancer, <50 yo 1st degree relative   Coronary artery disease Other        Male, 1st degree relative    ROS: no fevers or chills, productive cough, hemoptysis, dysphasia, odynophagia, melena, hematochezia, dysuria, hematuria, rash, seizure activity, orthopnea, PND, pedal edema, claudication. Remaining systems are negative.  Physical  Exam: Well-developed obese in no acute distress.  Skin is warm and dry.  HEENT is normal.  Neck is supple.  Chest is clear to auscultation with normal expansion.  Cardiovascular exam is irregular Abdominal exam nontender or distended. No masses palpated. Extremities show 1+ edema and chronic skin changes neuro grossly intact  ECG-atrial fibrillation at a rate of 72, normal axis, cannot rule out septal infarct, low voltage.  Personally reviewed  A/P  1 permanent atrial fibrillation-patient's  heart rate is controlled.  Continue Cardizem and metoprolol at present dose.  Continue apixaban.  2 chronic diastolic congestive heart failure/right heart failure-patient's predominant issue is right heart failure from history of obesity hypoventilation syndrome and obstructive sleep apnea.  Continue Demadex at present dose.  Check potassium and renal function.  3 hypertension-blood pressure controlled.  Continue present medications.  4 hyperlipidemia-continue statin.  5 morbid obesity-we discussed the importance of weight loss.  Kirk Ruths, MD

## 2022-07-15 ENCOUNTER — Other Ambulatory Visit: Payer: Self-pay

## 2022-07-15 MED ORDER — GLIPIZIDE ER 10 MG PO TB24: 10.0000 mg | ORAL_TABLET | Freq: Every day | ORAL | 3 refills | Status: DC

## 2022-07-22 ENCOUNTER — Encounter: Payer: Self-pay | Admitting: Cardiology

## 2022-07-22 ENCOUNTER — Ambulatory Visit: Payer: Medicare Other | Attending: Cardiology | Admitting: Cardiology

## 2022-07-22 VITALS — BP 124/78 | HR 72 | Ht 68.0 in | Wt 279.8 lb

## 2022-07-22 DIAGNOSIS — I10 Essential (primary) hypertension: Secondary | ICD-10-CM

## 2022-07-22 DIAGNOSIS — I4821 Permanent atrial fibrillation: Secondary | ICD-10-CM | POA: Diagnosis not present

## 2022-07-22 DIAGNOSIS — I5032 Chronic diastolic (congestive) heart failure: Secondary | ICD-10-CM | POA: Diagnosis not present

## 2022-07-22 DIAGNOSIS — E7800 Pure hypercholesterolemia, unspecified: Secondary | ICD-10-CM

## 2022-07-22 LAB — BASIC METABOLIC PANEL
BUN/Creatinine Ratio: 11 (ref 10–24)
BUN: 13 mg/dL (ref 8–27)
CO2: 34 mmol/L — ABNORMAL HIGH (ref 20–29)
Calcium: 9.5 mg/dL (ref 8.6–10.2)
Chloride: 102 mmol/L (ref 96–106)
Creatinine, Ser: 1.23 mg/dL (ref 0.76–1.27)
Glucose: 163 mg/dL — ABNORMAL HIGH (ref 70–99)
Potassium: 4.3 mmol/L (ref 3.5–5.2)
Sodium: 147 mmol/L — ABNORMAL HIGH (ref 134–144)
eGFR: 61 mL/min/{1.73_m2} (ref >59)

## 2022-07-22 NOTE — Patient Instructions (Signed)
  Follow-Up: At [facility], you and your health needs are our priority.  As part of our continuing mission to provide you with exceptional heart care, we have created designated Provider Care Teams.  These Care Teams include your primary Cardiologist (physician) and Advanced Practice Providers (APPs -  Physician Assistants and Nurse Practitioners) who all work together to provide you with the care you need, when you need it.  We recommend signing up for the patient portal called "MyChart".  Sign up information is provided on this After Visit Summary.  MyChart is used to connect with patients for Virtual Visits (Telemedicine).  Patients are able to view lab/test results, encounter notes, upcoming appointments, etc.  Non-urgent messages can be sent to your provider as well.   To learn more about what you can do with MyChart, go to [email or web address].    Your next appointment:   6 month(s)  The format for your next appointment:   In Person  Provider:   Brian Crenshaw, MD    

## 2022-07-24 DIAGNOSIS — J9610 Chronic respiratory failure, unspecified whether with hypoxia or hypercapnia: Secondary | ICD-10-CM | POA: Diagnosis not present

## 2022-07-26 ENCOUNTER — Telehealth: Payer: Self-pay | Admitting: Nurse Practitioner

## 2022-07-29 NOTE — Telephone Encounter (Signed)
ATC pt LVM for him in regards to [employer].

## 2022-08-20 ENCOUNTER — Ambulatory Visit (INDEPENDENT_AMBULATORY_CARE_PROVIDER_SITE_OTHER): Payer: Medicare Other | Admitting: *Deleted

## 2022-08-20 DIAGNOSIS — E538 Deficiency of other specified B group vitamins: Secondary | ICD-10-CM | POA: Diagnosis not present

## 2022-08-20 MED ORDER — CYANOCOBALAMIN 1000 MCG/ML IJ SOLN
1000.0000 ug | Freq: Once | INTRAMUSCULAR | Status: AC
  Administered 2022-08-20: 1000 ug via INTRAMUSCULAR

## 2022-08-20 NOTE — Progress Notes (Signed)
Pls cosign for B12 inj../lmb  

## 2022-09-13 ENCOUNTER — Encounter: Payer: Self-pay | Admitting: Podiatry

## 2022-09-13 ENCOUNTER — Ambulatory Visit (INDEPENDENT_AMBULATORY_CARE_PROVIDER_SITE_OTHER): Payer: Medicare Other | Admitting: Podiatry

## 2022-09-13 VITALS — BP 148/73

## 2022-09-13 DIAGNOSIS — M2012 Hallux valgus (acquired), left foot: Secondary | ICD-10-CM

## 2022-09-13 DIAGNOSIS — M2042 Other hammer toe(s) (acquired), left foot: Secondary | ICD-10-CM | POA: Diagnosis not present

## 2022-09-13 DIAGNOSIS — M79675 Pain in left toe(s): Secondary | ICD-10-CM | POA: Diagnosis not present

## 2022-09-13 DIAGNOSIS — M2011 Hallux valgus (acquired), right foot: Secondary | ICD-10-CM | POA: Diagnosis not present

## 2022-09-13 DIAGNOSIS — M79674 Pain in right toe(s): Secondary | ICD-10-CM | POA: Diagnosis not present

## 2022-09-13 DIAGNOSIS — M2041 Other hammer toe(s) (acquired), right foot: Secondary | ICD-10-CM

## 2022-09-13 DIAGNOSIS — M2142 Flat foot [pes planus] (acquired), left foot: Secondary | ICD-10-CM | POA: Diagnosis not present

## 2022-09-13 DIAGNOSIS — M2141 Flat foot [pes planus] (acquired), right foot: Secondary | ICD-10-CM

## 2022-09-13 DIAGNOSIS — E119 Type 2 diabetes mellitus without complications: Secondary | ICD-10-CM | POA: Diagnosis not present

## 2022-09-13 DIAGNOSIS — B351 Tinea unguium: Secondary | ICD-10-CM

## 2022-09-13 NOTE — Addendum Note (Signed)
Addended by: Marzetta Board on: 09/13/2022 11:34 PM   Modules accepted: Orders, Level of Service

## 2022-09-13 NOTE — Progress Notes (Addendum)
  Subjective:  Patient ID: [name] [name], male    DOB: [birth year],  MRN: [id number]  [name] [name] presents to clinic today for preventative diabetic foot care and painful thick toenails that are difficult to trim. Pain interferes with ambulation. Aggravating factors include wearing enclosed shoe gear. Pain is relieved with periodic professional debridement.  Chief Complaint  Patient presents with   Nail Problem    DFC BS-did not check today A1C-6.? PCP-[facility] PCP VST-04/2022   New problem(s): None.   He is accompanied by his daughter on today's visit. Patient  is happy to report he had his first great grandchild, a little boy named [name].  Patient has h/o obesity hypoventilation syndrome and is on O2.  Allergies  Allergen Reactions   Codeine     Altered mental status "goofy"   Heparin Rash     Abdominal rash 01/2013   Review of Systems: Negative except as noted in the HPI.  Objective: No changes noted in today's physical examination. Vitals:   09/13/22 0957  BP: (!) 148/73   [name] [name] is a pleasant 77 y.o. male morbidly obese in NAD. AAO x 3.  Vascular:  Capillary fill time to digits <3 seconds b/l lower extremities. Palpable pedal pulses b/l LE. Pedal hair sparse. Lower extremity skin temperature gradient within normal limits. No pain with calf compression b/l. Nonpitting edema noted b/l lower extremities.  Dermatological:  Pedal skin with normal turgor, texture and tone bilaterally. No open wounds bilaterally. No interdigital macerations bilaterally. Toenails 1-5 b/l elongated, discolored, dystrophic, thickened, crumbly with subungual debris and tenderness to dorsal palpation.  Musculoskeletal:  Normal muscle strength 5/5 to all lower extremity muscle groups bilaterally. No pain crepitus or joint limitation noted with ROM b/l.   Hallux valgus with bunion deformity noted b/l lower extremities.   Hammertoe(s) noted to the L 2nd toe.   Pes planus deformity noted b/l.    Neurological:  Protective sensation intact 5/5 intact bilaterally with 10g monofilament b/l. Vibratory sensation intact b/l.  Assessment/Plan: 1. Pain due to onychomycosis of toenails of both feet   2. Non-insulin dependent type 2 diabetes mellitus ([place])    Orders Placed This Encounter  Procedures   For Home Use Only DME Diabetic Shoe    Dispense one pair extra depth shoes and 3 pair total contact insoles.    -Patient was evaluated and treated. All patient's and/or POA's questions/concerns answered on today's visit. -Continue foot and shoe inspections daily. Monitor blood glucose per PCP/Endocrinologist's recommendations. -Discussed diabetic shoe benefit available based on patient's diagnoses. Patient/POA would like to proceed. Order entered for one pair extra depth shoes and 3 pair total contact insoles. Patient qualifies based on diagnoses. -Mycotic toenails 1-5 bilaterally were debrided in length and girth with sterile nail nippers and dremel without incident. -Patient/POA to call should there be question/concern in the interim.   Return in about 3 months (around 12/12/2022).  Marzetta Board, DPM

## 2022-09-16 DIAGNOSIS — G4733 Obstructive sleep apnea (adult) (pediatric): Secondary | ICD-10-CM | POA: Diagnosis not present

## 2022-09-16 DIAGNOSIS — J42 Unspecified chronic bronchitis: Secondary | ICD-10-CM | POA: Diagnosis not present

## 2022-09-22 ENCOUNTER — Telehealth: Payer: Self-pay

## 2022-09-22 ENCOUNTER — Other Ambulatory Visit: Payer: Self-pay

## 2022-09-22 NOTE — Progress Notes (Signed)
N/A unable to leave a message for patient to call back to schedule Medicare Annual Wellness Visit.  No hx of AWV   Please schedule at anytime with [facility] if patient calls the office back.    30 minute appointment.  Any questions, please call me at [phone]

## 2022-09-23 ENCOUNTER — Other Ambulatory Visit: Payer: Self-pay

## 2022-09-23 MED ORDER — TORSEMIDE 60 MG PO TABS: ORAL_TABLET | ORAL | 1 refills | Status: DC

## 2022-09-24 ENCOUNTER — Ambulatory Visit (INDEPENDENT_AMBULATORY_CARE_PROVIDER_SITE_OTHER): Payer: Medicare Other

## 2022-09-24 ENCOUNTER — Ambulatory Visit (INDEPENDENT_AMBULATORY_CARE_PROVIDER_SITE_OTHER): Payer: TRICARE For Life (TFL)

## 2022-09-24 ENCOUNTER — Encounter: Payer: Self-pay | Admitting: Cardiology

## 2022-09-24 DIAGNOSIS — M2141 Flat foot [pes planus] (acquired), right foot: Secondary | ICD-10-CM

## 2022-09-24 DIAGNOSIS — M2011 Hallux valgus (acquired), right foot: Secondary | ICD-10-CM

## 2022-09-24 DIAGNOSIS — M2041 Other hammer toe(s) (acquired), right foot: Secondary | ICD-10-CM

## 2022-09-24 DIAGNOSIS — E538 Deficiency of other specified B group vitamins: Secondary | ICD-10-CM

## 2022-09-24 DIAGNOSIS — E119 Type 2 diabetes mellitus without complications: Secondary | ICD-10-CM

## 2022-09-24 DIAGNOSIS — M2142 Flat foot [pes planus] (acquired), left foot: Secondary | ICD-10-CM

## 2022-09-24 DIAGNOSIS — M2042 Other hammer toe(s) (acquired), left foot: Secondary | ICD-10-CM

## 2022-09-24 DIAGNOSIS — M2012 Hallux valgus (acquired), left foot: Secondary | ICD-10-CM

## 2022-09-24 MED ORDER — TORSEMIDE 20 MG PO TABS: ORAL_TABLET | ORAL | 5 refills | Status: DC

## 2022-09-24 MED ORDER — CYANOCOBALAMIN 1000 MCG/ML IJ SOLN
1000.0000 ug | Freq: Once | INTRAMUSCULAR | Status: AC
  Administered 2022-09-24: 1000 ug via INTRAMUSCULAR

## 2022-09-24 NOTE — Progress Notes (Signed)
Patient presents to the office today for diabetic shoe and insole measuring.  Patient was measured with brannock device to determine size and width for 1 pair of extra depth shoes and foam casted for 3 pair of insoles.   ABN signed.   Documentation of medical necessity will be sent to patient's treating diabetic doctor to verify and sign on 10/25/2022 as he has an appointment with his treating physician.   Patient's diabetic provider: [name], MD   Shoes and insoles will be ordered at that time and patient will be notified for an appointment for fitting when they arrive.   Patient shoe selection-   1st   Shoe choice:   [id number] APEX  Shoe size ordered: 9.5W

## 2022-09-24 NOTE — Progress Notes (Signed)
Pt here for monthly B12 injection per Dr Jenny Reichmann  B12 1045mg given IM, and pt tolerated injection well.

## 2022-09-27 ENCOUNTER — Other Ambulatory Visit: Payer: Self-pay

## 2022-09-30 ENCOUNTER — Other Ambulatory Visit: Payer: Self-pay

## 2022-09-30 MED ORDER — TORSEMIDE 20 MG PO TABS: ORAL_TABLET | ORAL | 5 refills | Status: DC

## 2022-10-08 ENCOUNTER — Ambulatory Visit (INDEPENDENT_AMBULATORY_CARE_PROVIDER_SITE_OTHER): Payer: Medicare Other

## 2022-10-08 ENCOUNTER — Ambulatory Visit (INDEPENDENT_AMBULATORY_CARE_PROVIDER_SITE_OTHER): Payer: Medicare Other | Admitting: Internal Medicine

## 2022-10-08 VITALS — BP 138/82 | HR 54 | Temp 98.1°F | Ht 68.0 in | Wt 275.0 lb

## 2022-10-08 DIAGNOSIS — I10 Essential (primary) hypertension: Secondary | ICD-10-CM | POA: Diagnosis not present

## 2022-10-08 DIAGNOSIS — E538 Deficiency of other specified B group vitamins: Secondary | ICD-10-CM | POA: Diagnosis not present

## 2022-10-08 DIAGNOSIS — R052 Subacute cough: Secondary | ICD-10-CM

## 2022-10-08 DIAGNOSIS — Z0001 Encounter for general adult medical examination with abnormal findings: Secondary | ICD-10-CM | POA: Diagnosis not present

## 2022-10-08 DIAGNOSIS — L03115 Cellulitis of right lower limb: Secondary | ICD-10-CM

## 2022-10-08 DIAGNOSIS — E7800 Pure hypercholesterolemia, unspecified: Secondary | ICD-10-CM | POA: Diagnosis not present

## 2022-10-08 DIAGNOSIS — R5383 Other fatigue: Secondary | ICD-10-CM | POA: Diagnosis not present

## 2022-10-08 DIAGNOSIS — E559 Vitamin D deficiency, unspecified: Secondary | ICD-10-CM | POA: Diagnosis not present

## 2022-10-08 DIAGNOSIS — R053 Chronic cough: Secondary | ICD-10-CM | POA: Diagnosis not present

## 2022-10-08 DIAGNOSIS — E119 Type 2 diabetes mellitus without complications: Secondary | ICD-10-CM | POA: Diagnosis not present

## 2022-10-08 DIAGNOSIS — E662 Morbid (severe) obesity with alveolar hypoventilation: Secondary | ICD-10-CM

## 2022-10-08 LAB — CBC WITH DIFFERENTIAL/PLATELET
Basophils Absolute: 0.1 10*3/uL (ref 0.0–0.1)
Basophils Relative: 0.6 % (ref 0.0–3.0)
Eosinophils Absolute: 0.2 10*3/uL (ref 0.0–0.7)
Eosinophils Relative: 2 % (ref 0.0–5.0)
HCT: 45 % (ref 39.0–52.0)
Hemoglobin: 14.5 g/dL (ref 13.0–17.0)
Lymphocytes Relative: 24.5 % (ref 12.0–46.0)
Lymphs Abs: 2.4 10*3/uL (ref 0.7–4.0)
MCHC: 32.1 g/dL (ref 30.0–36.0)
MCV: 87.6 fl (ref 78.0–100.0)
Monocytes Absolute: 0.9 10*3/uL (ref 0.1–1.0)
Monocytes Relative: 8.9 % (ref 3.0–12.0)
Neutro Abs: 6.2 10*3/uL (ref 1.4–7.7)
Neutrophils Relative %: 64 % (ref 43.0–77.0)
Platelets: 275 10*3/uL (ref 150.0–400.0)
RBC: 5.14 Mil/uL (ref 4.22–5.81)
RDW: 16.3 % — ABNORMAL HIGH (ref 11.5–15.5)
WBC: 9.6 10*3/uL (ref 4.0–10.5)

## 2022-10-08 LAB — URINALYSIS, ROUTINE W REFLEX MICROSCOPIC
Bilirubin Urine: NEGATIVE
Ketones, ur: NEGATIVE
Leukocytes,Ua: NEGATIVE
Nitrite: NEGATIVE
Specific Gravity, Urine: 1.015 (ref 1.000–1.030)
Total Protein, Urine: NEGATIVE
Urine Glucose: NEGATIVE
Urobilinogen, UA: 0.2 (ref 0.0–1.0)
pH: 6 (ref 5.0–8.0)

## 2022-10-08 LAB — HEMOGLOBIN A1C: Hgb A1c MFr Bld: 7 % — ABNORMAL HIGH (ref 4.6–6.5)

## 2022-10-08 LAB — LIPID PANEL
Cholesterol: 102 mg/dL (ref 0–200)
HDL: 32.5 mg/dL — ABNORMAL LOW (ref >39.00)
LDL Cholesterol: 42 mg/dL (ref 0–99)
NonHDL: 69.34
Total CHOL/HDL Ratio: 3
Triglycerides: 135 mg/dL (ref 0.0–149.0)
VLDL: 27 mg/dL (ref 0.0–40.0)

## 2022-10-08 LAB — MICROALBUMIN / CREATININE URINE RATIO
Creatinine,U: 53 mg/dL
Microalb Creat Ratio: 13.2 mg/g (ref 0.0–30.0)
Microalb, Ur: 7 mg/dL — ABNORMAL HIGH (ref 0.0–1.9)

## 2022-10-08 LAB — HEPATIC FUNCTION PANEL
ALT: 9 U/L (ref 0–53)
AST: 10 U/L (ref 0–37)
Albumin: 3.7 g/dL (ref 3.5–5.2)
Alkaline Phosphatase: 73 U/L (ref 39–117)
Bilirubin, Direct: 0.3 mg/dL (ref 0.0–0.3)
Total Bilirubin: 0.8 mg/dL (ref 0.2–1.2)
Total Protein: 7.3 g/dL (ref 6.0–8.3)

## 2022-10-08 LAB — BASIC METABOLIC PANEL
BUN: 19 mg/dL (ref 6–23)
CO2: 34 mEq/L — ABNORMAL HIGH (ref 19–32)
Calcium: 9.9 mg/dL (ref 8.4–10.5)
Chloride: 101 mEq/L (ref 96–112)
Creatinine, Ser: 1.19 mg/dL (ref 0.40–1.50)
GFR: 59.2 mL/min — ABNORMAL LOW (ref >60.00)
Glucose, Bld: 64 mg/dL — ABNORMAL LOW (ref 70–99)
Potassium: 4.2 mEq/L (ref 3.5–5.1)
Sodium: 145 mEq/L (ref 135–145)

## 2022-10-08 LAB — TSH: TSH: 1.62 u[IU]/mL (ref 0.35–5.50)

## 2022-10-08 MED ORDER — LEVOFLOXACIN 500 MG PO TABS: 500.0000 mg | ORAL_TABLET | Freq: Every day | ORAL | 0 refills | Status: DC

## 2022-10-08 NOTE — Progress Notes (Unsigned)
Patient ID: [name] [name], male   DOB: 1946/04/02, 77 y.o.   MRN: [id number]         Chief Complaint:: wellness exam and Decreased appetite and Fatigue (Sleeping more)  , with daughter, right leg cellulitis       HPI:  Mingo RYZE BACKE is a 77 y.o. male here for wellness exam with daughter; declines covid booster, for shingrix at pharmacy, plans to call for eye exam soon, o/w up to date                        Also Does c/o ongoing fatigue, but denies signficant daytime hypersomnolence.  Appetite some lower as well.   Pt denies fever, severe wt loss, night sweats, or other constitutional symptoms   Did have URI last wk and spent some time in bed, now cough and symptoms resolved.  O2 has been > 90% at home.  Denies worsening depressive symptoms, suicidal ideation, or panic; has ongoing anxiety, not increased recently.   Pt denies chest pain, increased sob or doe, wheezing, orthopnea, PND, increased LE swelling, palpitations, dizziness or syncope.   Pt denies polydipsia, polyuria, or new focal neuro s/s.  Does also have new area of red, tender swelling to distal right leg just above the ankle without abscess or drainage.     Wt Readings from Last 3 Encounters:  10/08/22 275 lb (124.7 kg)  07/22/22 279 lb 12.8 oz (126.9 kg)  06/25/22 276 lb 12.8 oz (125.6 kg)   BP Readings from Last 3 Encounters:  10/08/22 138/82  09/13/22 (!) 148/73  07/22/22 124/78   Immunization History  Administered Date(s) Administered   Fluad Quad(high Dose 65+) 05/10/2019, 06/06/2020, 04/16/2021, 04/30/2022   H1N1 07/15/2008   Influenza Split 05/17/2011, 06/09/2012   Influenza Whole 05/09/2008, 06/24/2009, 06/26/2010   Influenza, High Dose Seasonal PF 04/15/2015, 04/19/2017, 04/25/2018   Influenza,inj,Quad PF,6+ Mos 04/05/2013, 04/11/2014, 04/13/2016   PFIZER(Purple Top)SARS-COV-2 Vaccination 10/07/2019, 10/31/2019, 08/30/2020   Pneumococcal Conjugate-13 06/01/2013, 10/09/2013   Pneumococcal Polysaccharide-23 02/07/2008,  10/19/2017   Td 02/07/2008   Tdap 04/25/2018   Zoster Recombinat (Shingrix) 04/06/2019   Zoster, Live 07/02/2008   Health Maintenance Due  Topic Date Due   Diabetic kidney evaluation - Urine ACR  10/16/2022      Past Medical History:  Diagnosis Date   Acute right-sided CHF (congestive heart failure) ([place])    a. 01/2013.   Arthritis    "knees and hands; thoracic area of the spine" (01/05/2013)   BRONCHITIS, CHRONIC 01/02/2009   COMMON MIGRAINE    "none since treating for high BP"   Complication of anesthesia    "aspiration pneumonia after hand OR" (01/05/2013)   DIABETES MELLITUS, TYPE II    GOUT    HYPERLIPIDEMIA    HYPERTENSION    Long term (current) use of anticoagulants    Morbid obesity (HCC)    OBESITY HYPOVENTILATION SYNDROME    a. on home O2.   On home oxygen therapy    OSA (obstructive sleep apnea)    Permanent atrial fibrillation ([place])    PROSTATE SPECIFIC ANTIGEN, ELEVATED 06/09/2007   Pulmonary HTN ([place])    a. multifactorial including obstructive sleep apnea, obesity hypoventilation syndrome and possible pulmonary venous hypertension.   SKIN RASH 03/12/2010   Unspecified hearing loss    Past Surgical History:  Procedure Laterality Date   APPENDECTOMY  1974   CARDIOVERSION  05/21/2008; ~ 06/2008   HERNIA REPAIR     INGUINAL  HERNIA REPAIR Right    RIGHT HEART CATHETERIZATION N/A 01/08/2013   Procedure: RIGHT HEART CATH;  Surgeon: Thayer Headings, MD;  Location: [facility] CATH LAB;  Service: Cardiovascular;  Laterality: N/A;   TENDON REPAIR Right 2009   "lacerated his tendon and pulley" Dr. Lenon Curt   UMBILICAL HERNIA REPAIR      reports that he quit smoking about 44 years ago. His smoking use included cigarettes. He has a 15.00 pack-year smoking history. He has never used smokeless tobacco. He reports current alcohol use. He reports that he does not use drugs. family history includes Alzheimer's disease in his father; Cancer in an other family member; Coronary artery  disease in an other family member; Prostate cancer in his father. Allergies  Allergen Reactions   Codeine     Altered mental status "goofy"   Heparin Rash     Abdominal rash 01/2013   Current Outpatient Medications on File Prior to Visit  Medication Sig Dispense Refill   allopurinol (ZYLOPRIM) 100 MG tablet TAKE 1 TABLET DAILY 90 tablet 3   atorvastatin (LIPITOR) 20 MG tablet TAKE 1 TABLET DAILY 90 tablet 3   cetirizine (ZYRTEC) 10 MG tablet Take 10 mg by mouth at bedtime.     cholecalciferol (VITAMIN D3) 25 MCG (1000 UNIT) tablet Take 2,000 Units by mouth daily.     cyanocobalamin (,VITAMIN B-12,) 1000 MCG/ML injection Inject 1,000 mcg into the muscle.     diltiazem (CARDIZEM CD) 180 MG 24 hr capsule TAKE 1 CAPSULE DAILY (NEED OFFICE VISIT) 90 capsule 3   diltiazem (TIAZAC) 180 MG 24 hr capsule      ELIQUIS 5 MG TABS tablet TAKE 1 TABLET TWICE A DAY 180 tablet 3   glipiZIDE (GLUCOTROL XL) 10 MG 24 hr tablet Take 1 tablet (10 mg total) by mouth daily with breakfast. 90 tablet 3   glucose blood (ONE TOUCH ULTRA TEST) test strip Use to check blood sugars once daily Dx E11.9 100 each 2   Lancets MISC Use as directed once daily  E11.9 100 each 12   metFORMIN (GLUCOPHAGE-XR) 500 MG 24 hr tablet TAKE 2 TABLETS IN THE MORNING 180 tablet 3   metoprolol tartrate (LOPRESSOR) 25 MG tablet TAKE 1 TABLET(25 MG) BY MOUTH TWICE DAILY 180 tablet 3   Misc Natural Products (TART CHERRY ADVANCED) CAPS Take 1 capsule by mouth daily.     OXYGEN Inhale 3-5 L into the lungs daily. 3L Daily  4L Resting  5L Exertion     potassium chloride SA (KLOR-CON) 20 MEQ tablet      tizanidine (ZANAFLEX) 2 MG capsule Take 1 capsule (2 mg total) by mouth 3 (three) times daily. 30 capsule 0   torsemide (DEMADEX) 20 MG tablet Take 4 tablets ('80mg'$ ) by mouth twice daily on Monday, Wednesday, Friday and 3 tablets ('60mg'$ ) by mouth twice daily the other days. 200 tablet 5   Zoster Vaccine Adjuvanted [facility]) injection       Current Facility-Administered Medications on File Prior to Visit  Medication Dose Route Frequency Provider Last Rate Last Admin   cyanocobalamin ((VITAMIN B-12)) injection 1,000 mcg  1,000 mcg Intramuscular Q30 days [name], MD   1,000 mcg at 08/06/21 0947        ROS:  All others reviewed and negative.  Objective        PE:  BP 138/82 (BP Location: Left Arm, Patient Position: Sitting, Cuff Size: Large)   Pulse (!) 54   Temp 98.1 F (36.7 C) (  Oral)   Ht '5\' 8"'$  (1.727 m)   Wt 275 lb (124.7 kg)   SpO2 94%   BMI 41.81 kg/m                 Constitutional: Pt appears in NAD but fatigued, on home o2               HENT: Head: NCAT.                Right Ear: External ear normal.                 Left Ear: External ear normal.                Eyes: . Pupils are equal, round, and reactive to light. Conjunctivae and EOM are normal               Nose: without d/c or deformity               Neck: Neck supple. Gross normal ROM               Cardiovascular: Normal rate and regular rhythm.                 Pulmonary/Chest: Effort normal and breath sounds without rales or wheezing.                Abd:  Soft, NT, ND, + BS, no organomegaly               Neurological: Pt is alert. At baseline orientation, motor grossly intact               Skin: Skin is warm. No rashes, no other new lesions, LE edema - !+ chronic LE edema, with 4  x 4 cm area right distal leg above the ankle medially with red, tender, swelling without ulcer               Psychiatric: Pt behavior is normal without agitation   Micro: none  Cardiac tracings I have personally interpreted today:  none  Pertinent Radiological findings (summarize): none   Lab Results  Component Value Date   WBC 10.7 (H) 04/30/2022   HGB 14.2 04/30/2022   HCT 43.4 04/30/2022   PLT 237.0 04/30/2022   GLUCOSE 163 (H) 07/22/2022   CHOL 115 04/30/2022   TRIG 158.0 (H) 04/30/2022   HDL 42.60 04/30/2022   LDLCALC 41 04/30/2022   ALT 11 04/30/2022    AST 12 04/30/2022   NA 147 (H) 07/22/2022   K 4.3 07/22/2022   CL 102 07/22/2022   CREATININE 1.23 07/22/2022   BUN 13 07/22/2022   CO2 34 (H) 07/22/2022   TSH 2.41 04/30/2022   PSA 1.62 10/15/2021   INR 1.25 01/09/2013   HGBA1C 6.7 (H) 04/30/2022   MICROALBUR 9.7 (H) 10/15/2021   Assessment/Plan:  Berlin D Aube is a 77 y.o. White or Caucasian [1] male with  has a past medical history of Acute right-sided CHF (congestive heart failure) ([place]), Arthritis, BRONCHITIS, CHRONIC (01/02/2009), COMMON MIGRAINE, Complication of anesthesia, DIABETES MELLITUS, TYPE II, GOUT, HYPERLIPIDEMIA, HYPERTENSION, Long term (current) use of anticoagulants, Morbid obesity ([place]), OBESITY HYPOVENTILATION SYNDROME, On home oxygen therapy, OSA (obstructive sleep apnea), Permanent atrial fibrillation ([place]), PROSTATE SPECIFIC ANTIGEN, ELEVATED (06/09/2007), Pulmonary HTN ([place]), SKIN RASH (03/12/2010), and Unspecified hearing loss.  Encounter for well adult exam with abnormal findings Age and sex appropriate education and counseling updated with regular exercise and diet Referrals for preventative  services - daughter to call for eye exam soon Immunizations addressed - declines covid booster, for shingrix at the pharmacy Smoking counseling  - none needed Evidence for depression or other mood disorder - none significant Most recent labs reviewed. I have personally reviewed and have noted: 1) the patient's medical and social history 2) The patient's current medications and supplements 3) The patient's height, weight, and BMI have been recorded in the chart   B12 deficiency Lab Results  Component Value Date   VITAMINB12 765 10/15/2021   Stable, cont oral replacement - b12 1000 mcg qd   Cellulitis of right leg Mild to mod, for antibx course levaquin 500 qd,  to f/u any worsening symptoms or concerns  Essential hypertension BP Readings from Last 3 Encounters:  10/08/22 138/82  09/13/22 (!) 148/73  07/22/22  124/78   Stable, pt to continue medical treatment card CD 180 qd, lopressor 25 bid   Fatigue Also for lab including cbc  Hyperlipidemia Lab Results  Component Value Date   LDLCALC 41 04/30/2022   Stable, pt to continue current statin lipitor 20 mg qd   Non-insulin dependent type 2 diabetes mellitus (HCC) Lab Results  Component Value Date   HGBA1C 6.7 (H) 04/30/2022   Stable, pt to continue current medical treatment glipizide xl 10 qd, metformin ER 500 mg - 2 qam,    Vitamin D deficiency Last vitamin D Lab Results  Component Value Date   VD25OH 61.62 04/30/2022   Stable, cont oral replacement   Subacute cough I suspect mild URI recently, may be the reason for fatigue and lower appetite, also for cxr today  Followup: Return in about 3 months (around 01/08/2023).  Cathlean Cower, MD 10/10/2022 6:05 PM [facility]

## 2022-10-08 NOTE — Patient Instructions (Signed)
Please take all new medication as prescribed - the levaquin 500 mg per day  Please continue all other medications as before, and refills have been done if requested.  Please have the pharmacy call with any other refills you may need.  Please keep your appointments with your specialists as you may have planned  Please go to the XRAY Department in the first floor for the x-ray testing  Please go to the LAB at the blood drawing area for the tests to be done  You will be contacted by phone if any changes need to be made immediately.  Otherwise, you will receive a letter about your results with an explanation, but please check with MyChart first.  Please remember to sign up for MyChart if you have not done so, as this will be important to you in the future with finding out test results, communicating by private email, and scheduling acute appointments online when needed.  Please make an Appointment to return in 3 months, or sooner if needed

## 2022-10-10 ENCOUNTER — Encounter: Payer: Self-pay | Admitting: Internal Medicine

## 2022-10-10 DIAGNOSIS — R052 Subacute cough: Secondary | ICD-10-CM | POA: Insufficient documentation

## 2022-10-10 NOTE — Assessment & Plan Note (Signed)
I suspect mild URI recently, may be the reason for fatigue and lower appetite, also for cxr today

## 2022-10-10 NOTE — Assessment & Plan Note (Signed)
BP Readings from Last 3 Encounters:  10/08/22 138/82  09/13/22 (!) 148/73  07/22/22 124/78   Stable, pt to continue medical treatment card CD 180 qd, lopressor 25 bid

## 2022-10-10 NOTE — Assessment & Plan Note (Signed)
Lab Results  Component Value Date   HGBA1C 6.7 (H) 04/30/2022   Stable, pt to continue current medical treatment glipizide xl 10 qd, metformin ER 500 mg - 2 qam,

## 2022-10-10 NOTE — Assessment & Plan Note (Signed)
Lab Results  Component Value Date   [id number] 10/15/2021   Stable, cont oral replacement - b12 1000 mcg qd

## 2022-10-10 NOTE — Assessment & Plan Note (Signed)
Lab Results  Component Value Date   LDLCALC 41 04/30/2022   Stable, pt to continue current statin lipitor 20 mg qd

## 2022-10-10 NOTE — Assessment & Plan Note (Signed)
Last vitamin D Lab Results  Component Value Date   VD25OH 61.62 04/30/2022   Stable, cont oral replacement

## 2022-10-10 NOTE — Assessment & Plan Note (Signed)
Also for lab including cbc

## 2022-10-10 NOTE — Assessment & Plan Note (Signed)
Mild to mod, for antibx course levaquin 500 qd,  to f/u any worsening symptoms or concerns

## 2022-10-10 NOTE — Assessment & Plan Note (Signed)
Age and sex appropriate education and counseling updated with regular exercise and diet Referrals for preventative services - daughter to call for eye exam soon Immunizations addressed - declines covid booster, for shingrix at the pharmacy Smoking counseling  - none needed Evidence for depression or other mood disorder - none significant Most recent labs reviewed. I have personally reviewed and have noted: 1) the patient's medical and social history 2) The patient's current medications and supplements 3) The patient's height, weight, and BMI have been recorded in the chart

## 2022-10-11 ENCOUNTER — Encounter: Payer: Self-pay | Admitting: Internal Medicine

## 2022-10-11 ENCOUNTER — Other Ambulatory Visit: Payer: Self-pay | Admitting: Internal Medicine

## 2022-10-11 LAB — VITAMIN D 25 HYDROXY (VIT D DEFICIENCY, FRACTURES): VITD: 58.29 ng/mL (ref 30.00–100.00)

## 2022-10-11 LAB — VITAMIN B12: Vitamin B-12: 596 pg/mL (ref 211–911)

## 2022-10-11 MED ORDER — FUROSEMIDE 20 MG PO TABS: 20.0000 mg | ORAL_TABLET | Freq: Every day | ORAL | 3 refills | Status: DC

## 2022-10-12 ENCOUNTER — Other Ambulatory Visit: Payer: Self-pay | Admitting: Internal Medicine

## 2022-10-12 MED ORDER — POTASSIUM CHLORIDE CRYS ER 20 MEQ PO TBCR: 20.0000 meq | EXTENDED_RELEASE_TABLET | Freq: Every day | ORAL | 3 refills | Status: DC

## 2022-10-13 ENCOUNTER — Encounter: Payer: Self-pay | Admitting: Internal Medicine

## 2022-10-14 MED ORDER — LEVOFLOXACIN 500 MG PO TABS: 500.0000 mg | ORAL_TABLET | Freq: Every day | ORAL | 0 refills | Status: AC

## 2022-10-14 NOTE — Telephone Encounter (Signed)
Please send levofloxacin to the [facility] in [place] as [facility] have not received the Rx

## 2022-10-19 ENCOUNTER — Other Ambulatory Visit (HOSPITAL_COMMUNITY): Payer: Self-pay

## 2022-10-26 ENCOUNTER — Encounter: Payer: Self-pay | Admitting: Internal Medicine

## 2022-10-26 MED ORDER — DILTIAZEM HCL ER COATED BEADS 180 MG PO CP24: ORAL_CAPSULE | ORAL | 3 refills | Status: DC

## 2022-10-29 ENCOUNTER — Ambulatory Visit: Payer: Medicare Other | Admitting: Internal Medicine

## 2022-11-18 ENCOUNTER — Inpatient Hospital Stay (HOSPITAL_COMMUNITY): Payer: Medicare Other

## 2022-11-18 ENCOUNTER — Other Ambulatory Visit: Payer: Self-pay | Admitting: Neurosurgery

## 2022-11-18 ENCOUNTER — Encounter (HOSPITAL_COMMUNITY): Payer: Self-pay

## 2022-11-18 ENCOUNTER — Emergency Department (HOSPITAL_COMMUNITY): Payer: Medicare Other

## 2022-11-18 ENCOUNTER — Inpatient Hospital Stay (HOSPITAL_COMMUNITY)
Admission: EM | Admit: 2022-11-18 | Discharge: 2022-12-06 | DRG: 519 | Disposition: A | Discharge status: Skilled Nursing Facility | Payer: Medicare Other | Attending: Student | Admitting: Student

## 2022-11-18 ENCOUNTER — Other Ambulatory Visit: Payer: Self-pay

## 2022-11-18 DIAGNOSIS — H9190 Unspecified hearing loss, unspecified ear: Secondary | ICD-10-CM | POA: Diagnosis not present

## 2022-11-18 DIAGNOSIS — Z7984 Long term (current) use of oral hypoglycemic drugs: Secondary | ICD-10-CM

## 2022-11-18 DIAGNOSIS — J9611 Chronic respiratory failure with hypoxia: Secondary | ICD-10-CM | POA: Diagnosis present

## 2022-11-18 DIAGNOSIS — E785 Hyperlipidemia, unspecified: Secondary | ICD-10-CM | POA: Diagnosis present

## 2022-11-18 DIAGNOSIS — I878 Other specified disorders of veins: Secondary | ICD-10-CM | POA: Diagnosis present

## 2022-11-18 DIAGNOSIS — N1831 Chronic kidney disease, stage 3a: Secondary | ICD-10-CM | POA: Diagnosis present

## 2022-11-18 DIAGNOSIS — E870 Hyperosmolality and hypernatremia: Secondary | ICD-10-CM | POA: Diagnosis not present

## 2022-11-18 DIAGNOSIS — I272 Pulmonary hypertension, unspecified: Secondary | ICD-10-CM | POA: Diagnosis not present

## 2022-11-18 DIAGNOSIS — E871 Hypo-osmolality and hyponatremia: Secondary | ICD-10-CM | POA: Diagnosis not present

## 2022-11-18 DIAGNOSIS — L89312 Pressure ulcer of right buttock, stage 2: Secondary | ICD-10-CM | POA: Diagnosis not present

## 2022-11-18 DIAGNOSIS — N179 Acute kidney failure, unspecified: Secondary | ICD-10-CM | POA: Diagnosis not present

## 2022-11-18 DIAGNOSIS — Z87891 Personal history of nicotine dependence: Secondary | ICD-10-CM

## 2022-11-18 DIAGNOSIS — E1122 Type 2 diabetes mellitus with diabetic chronic kidney disease: Secondary | ICD-10-CM | POA: Diagnosis present

## 2022-11-18 DIAGNOSIS — Z6841 Body Mass Index (BMI) 40.0 and over, adult: Secondary | ICD-10-CM

## 2022-11-18 DIAGNOSIS — E1151 Type 2 diabetes mellitus with diabetic peripheral angiopathy without gangrene: Secondary | ICD-10-CM | POA: Diagnosis present

## 2022-11-18 DIAGNOSIS — S0990XA Unspecified injury of head, initial encounter: Secondary | ICD-10-CM | POA: Diagnosis not present

## 2022-11-18 DIAGNOSIS — Z7409 Other reduced mobility: Secondary | ICD-10-CM | POA: Diagnosis not present

## 2022-11-18 DIAGNOSIS — Z888 Allergy status to other drugs, medicaments and biological substances status: Secondary | ICD-10-CM

## 2022-11-18 DIAGNOSIS — E874 Mixed disorder of acid-base balance: Secondary | ICD-10-CM | POA: Diagnosis not present

## 2022-11-18 DIAGNOSIS — Z4789 Encounter for other orthopedic aftercare: Secondary | ICD-10-CM | POA: Diagnosis not present

## 2022-11-18 DIAGNOSIS — K5900 Constipation, unspecified: Secondary | ICD-10-CM | POA: Diagnosis not present

## 2022-11-18 DIAGNOSIS — I4891 Unspecified atrial fibrillation: Secondary | ICD-10-CM | POA: Diagnosis not present

## 2022-11-18 DIAGNOSIS — I2729 Other secondary pulmonary hypertension: Secondary | ICD-10-CM | POA: Diagnosis not present

## 2022-11-18 DIAGNOSIS — E662 Morbid (severe) obesity with alveolar hypoventilation: Secondary | ICD-10-CM | POA: Diagnosis present

## 2022-11-18 DIAGNOSIS — I482 Chronic atrial fibrillation, unspecified: Secondary | ICD-10-CM | POA: Insufficient documentation

## 2022-11-18 DIAGNOSIS — S32019D Unspecified fracture of first lumbar vertebra, subsequent encounter for fracture with routine healing: Secondary | ICD-10-CM | POA: Diagnosis not present

## 2022-11-18 DIAGNOSIS — M109 Gout, unspecified: Secondary | ICD-10-CM | POA: Diagnosis present

## 2022-11-18 DIAGNOSIS — S22089A Unspecified fracture of T11-T12 vertebra, initial encounter for closed fracture: Secondary | ICD-10-CM | POA: Diagnosis not present

## 2022-11-18 DIAGNOSIS — I4821 Permanent atrial fibrillation: Secondary | ICD-10-CM | POA: Diagnosis present

## 2022-11-18 DIAGNOSIS — S32018A Other fracture of first lumbar vertebra, initial encounter for closed fracture: Secondary | ICD-10-CM | POA: Diagnosis not present

## 2022-11-18 DIAGNOSIS — Z9989 Dependence on other enabling machines and devices: Secondary | ICD-10-CM | POA: Diagnosis not present

## 2022-11-18 DIAGNOSIS — W19XXXA Unspecified fall, initial encounter: Secondary | ICD-10-CM | POA: Diagnosis not present

## 2022-11-18 DIAGNOSIS — G4733 Obstructive sleep apnea (adult) (pediatric): Secondary | ICD-10-CM | POA: Diagnosis not present

## 2022-11-18 DIAGNOSIS — I5032 Chronic diastolic (congestive) heart failure: Secondary | ICD-10-CM | POA: Diagnosis present

## 2022-11-18 DIAGNOSIS — E876 Hypokalemia: Secondary | ICD-10-CM | POA: Diagnosis present

## 2022-11-18 DIAGNOSIS — Z79899 Other long term (current) drug therapy: Secondary | ICD-10-CM

## 2022-11-18 DIAGNOSIS — E860 Dehydration: Secondary | ICD-10-CM | POA: Diagnosis not present

## 2022-11-18 DIAGNOSIS — I5082 Biventricular heart failure: Secondary | ICD-10-CM | POA: Diagnosis present

## 2022-11-18 DIAGNOSIS — I2781 Cor pulmonale (chronic): Secondary | ICD-10-CM | POA: Diagnosis not present

## 2022-11-18 DIAGNOSIS — E1165 Type 2 diabetes mellitus with hyperglycemia: Secondary | ICD-10-CM | POA: Diagnosis present

## 2022-11-18 DIAGNOSIS — Z7401 Bed confinement status: Secondary | ICD-10-CM | POA: Diagnosis not present

## 2022-11-18 DIAGNOSIS — Z743 Need for continuous supervision: Secondary | ICD-10-CM | POA: Diagnosis not present

## 2022-11-18 DIAGNOSIS — Z981 Arthrodesis status: Secondary | ICD-10-CM | POA: Diagnosis not present

## 2022-11-18 DIAGNOSIS — S32029A Unspecified fracture of second lumbar vertebra, initial encounter for closed fracture: Secondary | ICD-10-CM | POA: Diagnosis not present

## 2022-11-18 DIAGNOSIS — S32019A Unspecified fracture of first lumbar vertebra, initial encounter for closed fracture: Secondary | ICD-10-CM | POA: Diagnosis not present

## 2022-11-18 DIAGNOSIS — Z48811 Encounter for surgical aftercare following surgery on the nervous system: Secondary | ICD-10-CM | POA: Diagnosis not present

## 2022-11-18 DIAGNOSIS — M25571 Pain in right ankle and joints of right foot: Secondary | ICD-10-CM | POA: Diagnosis not present

## 2022-11-18 DIAGNOSIS — Z713 Dietary counseling and surveillance: Secondary | ICD-10-CM

## 2022-11-18 DIAGNOSIS — S22089D Unspecified fracture of T11-T12 vertebra, subsequent encounter for fracture with routine healing: Secondary | ICD-10-CM | POA: Diagnosis not present

## 2022-11-18 DIAGNOSIS — Z8249 Family history of ischemic heart disease and other diseases of the circulatory system: Secondary | ICD-10-CM

## 2022-11-18 DIAGNOSIS — S32018G Other fracture of first lumbar vertebra, subsequent encounter for fracture with delayed healing: Secondary | ICD-10-CM | POA: Diagnosis not present

## 2022-11-18 DIAGNOSIS — Z5329 Procedure and treatment not carried out because of patient's decision for other reasons: Secondary | ICD-10-CM | POA: Diagnosis not present

## 2022-11-18 DIAGNOSIS — S22088A Other fracture of T11-T12 vertebra, initial encounter for closed fracture: Secondary | ICD-10-CM | POA: Diagnosis not present

## 2022-11-18 DIAGNOSIS — M170 Bilateral primary osteoarthritis of knee: Secondary | ICD-10-CM | POA: Diagnosis present

## 2022-11-18 DIAGNOSIS — M549 Dorsalgia, unspecified: Secondary | ICD-10-CM | POA: Diagnosis not present

## 2022-11-18 DIAGNOSIS — S32028D Other fracture of second lumbar vertebra, subsequent encounter for fracture with routine healing: Secondary | ICD-10-CM | POA: Diagnosis not present

## 2022-11-18 DIAGNOSIS — Z043 Encounter for examination and observation following other accident: Secondary | ICD-10-CM | POA: Diagnosis not present

## 2022-11-18 DIAGNOSIS — M545 Low back pain, unspecified: Secondary | ICD-10-CM | POA: Diagnosis not present

## 2022-11-18 DIAGNOSIS — R9431 Abnormal electrocardiogram [ECG] [EKG]: Secondary | ICD-10-CM | POA: Diagnosis not present

## 2022-11-18 DIAGNOSIS — R6889 Other general symptoms and signs: Secondary | ICD-10-CM | POA: Diagnosis not present

## 2022-11-18 DIAGNOSIS — W109XXA Fall (on) (from) unspecified stairs and steps, initial encounter: Secondary | ICD-10-CM | POA: Diagnosis present

## 2022-11-18 DIAGNOSIS — Z9981 Dependence on supplemental oxygen: Secondary | ICD-10-CM

## 2022-11-18 DIAGNOSIS — R0902 Hypoxemia: Secondary | ICD-10-CM | POA: Diagnosis not present

## 2022-11-18 DIAGNOSIS — I10 Essential (primary) hypertension: Secondary | ICD-10-CM | POA: Diagnosis not present

## 2022-11-18 DIAGNOSIS — Z7901 Long term (current) use of anticoagulants: Secondary | ICD-10-CM

## 2022-11-18 DIAGNOSIS — J449 Chronic obstructive pulmonary disease, unspecified: Secondary | ICD-10-CM | POA: Diagnosis present

## 2022-11-18 DIAGNOSIS — I130 Hypertensive heart and chronic kidney disease with heart failure and stage 1 through stage 4 chronic kidney disease, or unspecified chronic kidney disease: Secondary | ICD-10-CM | POA: Diagnosis not present

## 2022-11-18 DIAGNOSIS — S32010A Wedge compression fracture of first lumbar vertebra, initial encounter for closed fracture: Secondary | ICD-10-CM | POA: Diagnosis not present

## 2022-11-18 DIAGNOSIS — E119 Type 2 diabetes mellitus without complications: Secondary | ICD-10-CM | POA: Diagnosis not present

## 2022-11-18 DIAGNOSIS — M4815 Ankylosing hyperostosis [Forestier], thoracolumbar region: Secondary | ICD-10-CM | POA: Diagnosis not present

## 2022-11-18 DIAGNOSIS — Z885 Allergy status to narcotic agent status: Secondary | ICD-10-CM

## 2022-11-18 DIAGNOSIS — T1490XA Injury, unspecified, initial encounter: Secondary | ICD-10-CM | POA: Diagnosis not present

## 2022-11-18 LAB — CBC WITH DIFFERENTIAL/PLATELET
Abs Immature Granulocytes: 0.06 10*3/uL (ref 0.00–0.07)
Basophils Absolute: 0.1 10*3/uL (ref 0.0–0.1)
Basophils Relative: 1 %
Eosinophils Absolute: 0.1 10*3/uL (ref 0.0–0.5)
Eosinophils Relative: 1 %
HCT: 43.7 % (ref 39.0–52.0)
Hemoglobin: 13.8 g/dL (ref 13.0–17.0)
Immature Granulocytes: 0 %
Lymphocytes Relative: 15 %
Lymphs Abs: 2.4 10*3/uL (ref 0.7–4.0)
MCH: 28.8 pg (ref 26.0–34.0)
MCHC: 31.6 g/dL (ref 30.0–36.0)
MCV: 91 fL (ref 80.0–100.0)
Monocytes Absolute: 1.2 10*3/uL — ABNORMAL HIGH (ref 0.1–1.0)
Monocytes Relative: 8 %
Neutro Abs: 11.6 10*3/uL — ABNORMAL HIGH (ref 1.7–7.7)
Neutrophils Relative %: 75 %
Platelets: 227 10*3/uL (ref 150–400)
RBC: 4.8 MIL/uL (ref 4.22–5.81)
RDW: 15.5 % (ref 11.5–15.5)
WBC: 15.4 10*3/uL — ABNORMAL HIGH (ref 4.0–10.5)
nRBC: 0 % (ref 0.0–0.2)

## 2022-11-18 LAB — COMPREHENSIVE METABOLIC PANEL
ALT: 12 U/L (ref 0–44)
AST: 22 U/L (ref 15–41)
Albumin: 3.3 g/dL — ABNORMAL LOW (ref 3.5–5.0)
Alkaline Phosphatase: 69 U/L (ref 38–126)
Anion gap: 13 (ref 5–15)
BUN: 17 mg/dL (ref 8–23)
CO2: 30 mmol/L (ref 22–32)
Calcium: 9 mg/dL (ref 8.9–10.3)
Chloride: 97 mmol/L — ABNORMAL LOW (ref 98–111)
Creatinine, Ser: 1.26 mg/dL — ABNORMAL HIGH (ref 0.61–1.24)
GFR, Estimated: 59 mL/min — ABNORMAL LOW (ref >60)
Glucose, Bld: 137 mg/dL — ABNORMAL HIGH (ref 70–99)
Potassium: 3.4 mmol/L — ABNORMAL LOW (ref 3.5–5.1)
Sodium: 140 mmol/L (ref 135–145)
Total Bilirubin: 1.7 mg/dL — ABNORMAL HIGH (ref 0.3–1.2)
Total Protein: 7 g/dL (ref 6.5–8.1)

## 2022-11-18 LAB — GLUCOSE, CAPILLARY
Glucose-Capillary: 126 mg/dL — ABNORMAL HIGH (ref 70–99)
Glucose-Capillary: 126 mg/dL — ABNORMAL HIGH (ref 70–99)

## 2022-11-18 MED ORDER — ACETAMINOPHEN 325 MG PO TABS: 650.0000 mg | ORAL_TABLET | Freq: Four times a day (QID) | ORAL | Status: DC | PRN

## 2022-11-18 MED ORDER — ALLOPURINOL 100 MG PO TABS
100.0000 mg | ORAL_TABLET | Freq: Every day | ORAL | Status: DC
  Administered 2022-11-19 – 2022-12-06 (×18): 100 mg via ORAL
  Filled 2022-11-18 (×18): qty 1

## 2022-11-18 MED ORDER — POTASSIUM CHLORIDE CRYS ER 20 MEQ PO TBCR
40.0000 meq | EXTENDED_RELEASE_TABLET | Freq: Once | ORAL | Status: AC
  Administered 2022-11-18: 40 meq via ORAL
  Filled 2022-11-18: qty 2

## 2022-11-18 MED ORDER — TIZANIDINE HCL 2 MG PO CAPS: 2.0000 mg | ORAL_CAPSULE | Freq: Three times a day (TID) | ORAL | Status: DC

## 2022-11-18 MED ORDER — ACETAMINOPHEN 650 MG RE SUPP: 650.0000 mg | Freq: Four times a day (QID) | RECTAL | Status: DC | PRN

## 2022-11-18 MED ORDER — FUROSEMIDE 20 MG PO TABS
20.0000 mg | ORAL_TABLET | Freq: Every day | ORAL | Status: DC
  Filled 2022-11-18: qty 1

## 2022-11-18 MED ORDER — SENNOSIDES-DOCUSATE SODIUM 8.6-50 MG PO TABS: 1.0000 | ORAL_TABLET | Freq: Every evening | ORAL | Status: DC | PRN

## 2022-11-18 MED ORDER — HYDROCODONE-ACETAMINOPHEN 5-325 MG PO TABS
1.0000 | ORAL_TABLET | Freq: Once | ORAL | Status: AC
  Administered 2022-11-18: 1 via ORAL
  Filled 2022-11-18: qty 1

## 2022-11-18 MED ORDER — INSULIN ASPART 100 UNIT/ML IJ SOLN: 0.0000 [IU] | Freq: Every day | INTRAMUSCULAR | Status: DC

## 2022-11-18 MED ORDER — ASPIRIN 81 MG PO TBEC
81.0000 mg | DELAYED_RELEASE_TABLET | Freq: Every day | ORAL | Status: DC
  Administered 2022-11-18: 81 mg via ORAL
  Filled 2022-11-18: qty 1

## 2022-11-18 MED ORDER — TORSEMIDE 20 MG PO TABS
80.0000 mg | ORAL_TABLET | ORAL | Status: DC
  Administered 2022-11-19: 80 mg via ORAL
  Filled 2022-11-18 (×2): qty 4

## 2022-11-18 MED ORDER — METOPROLOL TARTRATE 25 MG PO TABS
25.0000 mg | ORAL_TABLET | Freq: Two times a day (BID) | ORAL | Status: DC
  Administered 2022-11-18 – 2022-12-06 (×35): 25 mg via ORAL
  Filled 2022-11-18 (×35): qty 1

## 2022-11-18 MED ORDER — DILTIAZEM HCL ER COATED BEADS 180 MG PO CP24
180.0000 mg | ORAL_CAPSULE | Freq: Every day | ORAL | Status: DC
  Administered 2022-11-18 – 2022-12-06 (×19): 180 mg via ORAL
  Filled 2022-11-18 (×20): qty 1

## 2022-11-18 MED ORDER — OXYCODONE HCL 5 MG PO TABS
5.0000 mg | ORAL_TABLET | ORAL | Status: DC | PRN
  Administered 2022-11-18 – 2022-11-21 (×4): 5 mg via ORAL
  Filled 2022-11-18 (×4): qty 1

## 2022-11-18 MED ORDER — HYDROMORPHONE HCL 1 MG/ML IJ SOLN
0.5000 mg | INTRAMUSCULAR | Status: DC | PRN
  Administered 2022-11-18 – 2022-11-20 (×6): 1 mg via INTRAVENOUS
  Administered 2022-11-20: 0.5 mg via INTRAVENOUS
  Administered 2022-11-20: 1 mg via INTRAVENOUS
  Administered 2022-11-20: 0.5 mg via INTRAVENOUS
  Administered 2022-11-21: 1 mg via INTRAVENOUS
  Administered 2022-11-21: 0.5 mg via INTRAVENOUS
  Administered 2022-11-21 – 2022-11-22 (×3): 1 mg via INTRAVENOUS
  Administered 2022-11-22: 0.5 mg via INTRAVENOUS
  Administered 2022-11-22 – 2022-11-23 (×4): 1 mg via INTRAVENOUS
  Administered 2022-11-24: 0.5 mg via INTRAVENOUS
  Administered 2022-11-24: 1 mg via INTRAVENOUS
  Administered 2022-11-24: 0.5 mg via INTRAVENOUS
  Administered 2022-11-25 – 2022-11-29 (×10): 1 mg via INTRAVENOUS
  Administered 2022-11-29 – 2022-12-05 (×2): 0.5 mg via INTRAVENOUS
  Filled 2022-11-18 (×34): qty 1

## 2022-11-18 MED ORDER — INSULIN ASPART 100 UNIT/ML IJ SOLN
0.0000 [IU] | Freq: Three times a day (TID) | INTRAMUSCULAR | Status: DC
  Administered 2022-11-20 – 2022-11-21 (×3): 2 [IU] via SUBCUTANEOUS
  Administered 2022-11-22: 3 [IU] via SUBCUTANEOUS
  Administered 2022-11-22: 2 [IU] via SUBCUTANEOUS
  Administered 2022-11-23: 3 [IU] via SUBCUTANEOUS
  Administered 2022-11-23: 2 [IU] via SUBCUTANEOUS
  Administered 2022-11-24 (×2): 3 [IU] via SUBCUTANEOUS
  Administered 2022-11-25 – 2022-11-26 (×3): 2 [IU] via SUBCUTANEOUS
  Administered 2022-11-26 (×2): 3 [IU] via SUBCUTANEOUS
  Administered 2022-11-27: 2 [IU] via SUBCUTANEOUS
  Administered 2022-11-27 – 2022-11-28 (×2): 3 [IU] via SUBCUTANEOUS
  Administered 2022-11-29: 2 [IU] via SUBCUTANEOUS
  Administered 2022-12-01: 3 [IU] via SUBCUTANEOUS
  Administered 2022-12-01 – 2022-12-02 (×2): 2 [IU] via SUBCUTANEOUS
  Administered 2022-12-02: 3 [IU] via SUBCUTANEOUS
  Administered 2022-12-02 – 2022-12-04 (×6): 2 [IU] via SUBCUTANEOUS
  Administered 2022-12-05: 3 [IU] via SUBCUTANEOUS

## 2022-11-18 MED ORDER — BISACODYL 5 MG PO TBEC
5.0000 mg | DELAYED_RELEASE_TABLET | Freq: Every day | ORAL | Status: DC | PRN
  Administered 2022-11-22: 5 mg via ORAL
  Filled 2022-11-18 (×2): qty 1

## 2022-11-18 MED ORDER — ATORVASTATIN CALCIUM 10 MG PO TABS
20.0000 mg | ORAL_TABLET | Freq: Every day | ORAL | Status: DC
  Administered 2022-11-18 – 2022-12-05 (×17): 20 mg via ORAL
  Filled 2022-11-18 (×18): qty 2

## 2022-11-18 NOTE — ED Triage Notes (Signed)
Coming from home witnessed fall from neighbor denies hitting head this AM down the steps on third from bottom slipped and fell on left side first cal bandaged elbow and thumb refused to come called ems back for back pain no LOC on eliquis.  Baseline 5L and was with SOB   Vitals 148/98 @ 1040 88 HR 18 RR 97% on 5L CBG 197

## 2022-11-18 NOTE — ED Notes (Signed)
Pt does not take lasix just torsemide admitting MD sent a message that the family made mistake

## 2022-11-18 NOTE — Progress Notes (Signed)
Chaplain responded to level 2 trauma page. Pt is being attended to by physician at present. No family present and pt does not appear to be in distress as evidenced by euthymic mood and affect. Pt is not crying out in distress. For this reason, chaplain remains available for support secondary to medical intervention. Please page as further needs arise.  Maryanna Shape. Carley Hammed, M.Div. [facility] Chaplain Pager [phone] Office [phone]

## 2022-11-18 NOTE — H&P (Signed)
History and Physical    [name] [name] TRV:[id number] DOB: Mar 14, 1946 DOA: 11/18/2022  PCP: Corwin Levins, MD (Confirm with patient/family/NH records and if not entered, this has to be entered at [facility] point of entry) Patient coming from: Home  I have personally briefly reviewed patient's old medical records in [facility]  Chief Complaint: Fall and back pain  HPI: [name] [name] is a 77 y.o. male with medical history significant of chronic A-fib on Eliquis, chronic HFpEF, severe pulmonary hypertension and cor pulmonale, chronic hypoxic respiratory failure on 3 to 5 L continuously, HTN and HLD, OSA, sustained a mechanical fall and came in with severe back pain.  Patient missed 1 step and then slipped and then fall on his back.  Denies any head injury, no LOC.  Severe back pain but denies any leg pain or weakness numbness of the legs or feet.  ED Course: CT lumbar and thoracic spine showed T12/L1 level widening of anterior disc space and mildly displaced fracture of the posterior superior endplate of L1 and possibly anterior inferior endplate of T12.  Neurosurgery reviewed films and concluded the fracture is unstable requiring surgical intervention.  Review of Systems: As per HPI otherwise 14 point review of systems negative.    Past Medical History:  Diagnosis Date   Acute right-sided CHF (congestive heart failure)    a. 01/2013.   Arthritis    "knees and hands; thoracic area of the spine" (01/05/2013)   BRONCHITIS, CHRONIC 01/02/2009   COMMON MIGRAINE    "none since treating for high BP"   Complication of anesthesia    "aspiration pneumonia after hand OR" (01/05/2013)   DIABETES MELLITUS, TYPE II    GOUT    HYPERLIPIDEMIA    HYPERTENSION    Long term (current) use of anticoagulants    Morbid obesity    OBESITY HYPOVENTILATION SYNDROME    a. on home O2.   On home oxygen therapy    OSA (obstructive sleep apnea)    Permanent atrial fibrillation    PROSTATE SPECIFIC ANTIGEN, ELEVATED  06/09/2007   Pulmonary HTN    a. multifactorial including obstructive sleep apnea, obesity hypoventilation syndrome and possible pulmonary venous hypertension.   SKIN RASH 03/12/2010   Unspecified hearing loss     Past Surgical History:  Procedure Laterality Date   APPENDECTOMY  1974   CARDIOVERSION  05/21/2008; ~ 06/2008   HERNIA REPAIR     INGUINAL HERNIA REPAIR Right    RIGHT HEART CATHETERIZATION N/A 01/08/2013   Procedure: RIGHT HEART CATH;  Surgeon: Vesta Mixer, MD;  Location: [facility] CATH LAB;  Service: Cardiovascular;  Laterality: N/A;   TENDON REPAIR Right 2009   "lacerated his tendon and pulley" Dr. Izora Ribas   UMBILICAL HERNIA REPAIR       reports that he quit smoking about 44 years ago. His smoking use included cigarettes. He has a 15.00 pack-year smoking history. He has never used smokeless tobacco. He reports current alcohol use. He reports that he does not use drugs.  Allergies  Allergen Reactions   Codeine     Altered mental status "goofy"   Heparin Rash     Abdominal rash 01/2013    Family History  Problem Relation Age of Onset   Alzheimer's disease Father    Prostate cancer Father    Cancer Other        Breast Cancer, <50 yo 1st degree relative   Coronary artery disease Other  Male, 1st degree relative     Prior to Admission medications   Medication Sig Start Date End Date Taking? Authorizing Provider  furosemide (LASIX) 20 MG tablet Take 1 tablet (20 mg total) by mouth daily. 10/11/22   Corwin Levins, MD  allopurinol (ZYLOPRIM) 100 MG tablet TAKE 1 TABLET DAILY 01/25/22   Corwin Levins, MD  atorvastatin (LIPITOR) 20 MG tablet TAKE 1 TABLET DAILY 01/25/22   Corwin Levins, MD  cetirizine (ZYRTEC) 10 MG tablet Take 10 mg by mouth at bedtime.    [provider]  cholecalciferol (VITAMIN D3) 25 MCG (1000 UNIT) tablet Take 2,000 Units by mouth daily.    [provider]  cyanocobalamin (,VITAMIN B-12,) 1000 MCG/ML injection Inject 1,000 mcg into  the muscle.    [provider]  diltiazem (CARDIZEM CD) 180 MG 24 hr capsule 1 tab by mouth once daily 10/26/22   Corwin Levins, MD  diltiazem [facility]) 180 MG 24 hr capsule  10/03/19   [provider]  ELIQUIS 5 MG TABS tablet TAKE 1 TABLET TWICE A DAY 01/25/22   Lewayne Bunting, MD  glipiZIDE (GLUCOTROL XL) 10 MG 24 hr tablet Take 1 tablet (10 mg total) by mouth daily with breakfast. 07/15/22   Corwin Levins, MD  glucose blood (ONE TOUCH ULTRA TEST) test strip Use to check blood sugars once daily Dx E11.9 05/13/17   Corwin Levins, MD  Lancets MISC Use as directed once daily  E11.9 04/19/17   Corwin Levins, MD  metFORMIN (GLUCOPHAGE-XR) 500 MG 24 hr tablet TAKE 2 TABLETS IN THE MORNING 01/25/22   Corwin Levins, MD  metoprolol tartrate (LOPRESSOR) 25 MG tablet TAKE 1 TABLET(25 MG) BY MOUTH TWICE DAILY 07/12/22   Corwin Levins, MD  Misc Natural Products (TART CHERRY ADVANCED) CAPS Take 1 capsule by mouth daily.    [provider]  OXYGEN Inhale 3-5 L into the lungs daily. 3L Daily  4L Resting  5L Exertion    [provider]  potassium chloride SA (KLOR-CON M) 20 MEQ tablet Take 1 tablet (20 mEq total) by mouth daily. 10/12/22   Corwin Levins, MD  tizanidine (ZANAFLEX) 2 MG capsule Take 1 capsule (2 mg total) by mouth 3 (three) times daily. 06/20/20   Olive Bass, FNP  torsemide [facility]) 20 MG tablet Take 4 tablets (80mg ) by mouth twice daily on Monday, Wednesday, Friday and 3 tablets (60mg ) by mouth twice daily the other days. 09/30/22   Lewayne Bunting, MD  Zoster Vaccine Adjuvanted [facility]) injection  06/13/19   [provider]    Physical Exam: Vitals:   11/18/22 1130 11/18/22 1345 11/18/22 1415 11/18/22 1430  BP: (!) 166/68   (!) 153/94  Pulse:  77 64 94  Resp: 16 (!) 24 (!) 21 (!) 24  Temp:      TempSrc:      SpO2: 96% 97% 97% 93%  Weight:      Height:        Constitutional: NAD, calm, comfortable Vitals:   11/18/22 1130 11/18/22  1345 11/18/22 1415 11/18/22 1430  BP: (!) 166/68   (!) 153/94  Pulse:  77 64 94  Resp: 16 (!) 24 (!) 21 (!) 24  Temp:      TempSrc:      SpO2: 96% 97% 97% 93%  Weight:      Height:       Eyes: PERRL, lids and conjunctivae normal ENMT: Mucous  membranes are moist. Posterior pharynx clear of any exudate or lesions.Normal dentition.  Neck: normal, supple, no masses, no thyromegaly Respiratory: clear to auscultation bilaterally, no wheezing, no crackles. Normal respiratory effort. No accessory muscle use.  Cardiovascular: Regular rate and rhythm, no murmurs / rubs / gallops. No extremity edema. 2+ pedal pulses. No carotid bruits.  Abdomen: no tenderness, no masses palpated. No hepatosplenomegaly. Bowel sounds positive.  Musculoskeletal: no clubbing / cyanosis. No joint deformity upper and lower extremities. Good ROM, no contractures. Normal muscle tone.  Skin: no rashes, lesions, ulcers. No induration Neurologic: CN 2-12 grossly intact. Sensation intact, DTR normal. Strength 5/5 in all 4.  Psychiatric: Normal judgment and insight. Alert and oriented x 3. Normal mood.     Labs on Admission: I have personally reviewed following labs and imaging studies  CBC: Recent Labs  Lab 11/18/22 1228  WBC 15.4*  NEUTROABS 11.6*  HGB 13.8  HCT 43.7  MCV 91.0  PLT 227   Basic Metabolic Panel: Recent Labs  Lab 11/18/22 1228  NA 140  K 3.4*  CL 97*  CO2 30  GLUCOSE 137*  BUN 17  CREATININE 1.26*  CALCIUM 9.0   GFR: Estimated Creatinine Clearance: 64.1 mL/min (A) (by C-G formula based on SCr of 1.26 mg/dL (H)). Liver Function Tests: Recent Labs  Lab 11/18/22 1228  AST 22  ALT 12  ALKPHOS 69  BILITOT 1.7*  PROT 7.0  ALBUMIN 3.3*   No results for input(s): "LIPASE", "AMYLASE" in the last 168 hours. No results for input(s): "AMMONIA" in the last 168 hours. Coagulation Profile: No results for input(s): "INR", "PROTIME" in the last 168 hours. Cardiac Enzymes: No results for  input(s): "CKTOTAL", "CKMB", "CKMBINDEX", "TROPONINI" in the last 168 hours. BNP (last 3 results) No results for input(s): "PROBNP" in the last 8760 hours. HbA1C: No results for input(s): "HGBA1C" in the last 72 hours. CBG: No results for input(s): "GLUCAP" in the last 168 hours. Lipid Profile: No results for input(s): "CHOL", "HDL", "LDLCALC", "TRIG", "CHOLHDL", "LDLDIRECT" in the last 72 hours. Thyroid Function Tests: No results for input(s): "TSH", "T4TOTAL", "FREET4", "T3FREE", "THYROIDAB" in the last 72 hours. Anemia Panel: No results for input(s): "VITAMINB12", "FOLATE", "FERRITIN", "TIBC", "IRON", "RETICCTPCT" in the last 72 hours. Urine analysis:    Component Value Date/Time   COLORURINE YELLOW 10/08/2022 1611   APPEARANCEUR CLEAR 10/08/2022 1611   LABSPEC 1.015 10/08/2022 1611   PHURINE 6.0 10/08/2022 1611   GLUCOSEU NEGATIVE 10/08/2022 1611   HGBUR TRACE-INTACT (A) 10/08/2022 1611   BILIRUBINUR NEGATIVE 10/08/2022 1611   KETONESUR NEGATIVE 10/08/2022 1611   PROTEINUR TRACE (A) 04/11/2020 1409   UROBILINOGEN 0.2 10/08/2022 1611   NITRITE NEGATIVE 10/08/2022 1611   LEUKOCYTESUR NEGATIVE 10/08/2022 1611    Radiological Exams on Admission: CT Thoracic Spine Wo Contrast  Result Date: 11/18/2022 CLINICAL DATA:  Trauma EXAM: CT THORACIC, AND LUMBAR SPINE WITHOUT CONTRAST TECHNIQUE: Multidetector CT imaging of the thoracic and lumbar spine was performed without intravenous contrast. Multiplanar CT image reconstructions were also generated. RADIATION DOSE REDUCTION: This exam was performed according to the departmental dose-optimization program which includes automated exposure control, adjustment of the mA and/or kV according to patient size and/or use of iterative reconstruction technique. COMPARISON:  Lumbar spine radiograph 09/12/20 FINDINGS: CT THORACIC SPINE FINDINGS There is a distraction type injury at the T12 L1 level with widening of the anterior disc space (series 9, image  39) and mildly displaced fractures of the posterosuperior endplate of L1 (series 9, image 40)  and possibly the anterior inferior endplate T12 (series 9, image 37). There is also a nondisplaced fracture through L1 spinous process (series 7, image 49). There is prevertebral soft tissue stranding along the anterior margin of the T12-L1 disc space, compatible with posttraumatic changes. Alignment: Normal. Vertebrae: See above Paraspinal and other soft tissues: See above Disc levels: No evidence of high-grade spinal canal stenosis. CT LUMBAR SPINE FINDINGS Segmentation: 5 lumbar type vertebrae. Alignment: Normal.  See above for description of T12-L1 disc space Vertebrae: No acute fracture or focal pathologic process.  See above Paraspinal and other soft tissues: Negative. Disc levels: No evidence of high-grade spinal canal stenosis. Mild-to-moderate bilateral neural foraminal narrowing at L4-L5. IMPRESSION: 1. Three column distraction type injury at T12-L1 level with widening of the anterior disc space and mildly displaced fractures of the posterosuperior endplate of L1 and possibly the anterior inferior endplate of T12. Nondisplaced fracture through the L1 spinous process. 2. Prevertebral soft tissue stranding along the anterior margin of the T12-L1 disc space, compatible with posttraumatic changes. Electronically Signed   By: Lorenza Cambridge M.D.   On: 11/18/2022 11:58   CT Lumbar Spine Wo Contrast  Result Date: 11/18/2022 CLINICAL DATA:  Trauma EXAM: CT THORACIC, AND LUMBAR SPINE WITHOUT CONTRAST TECHNIQUE: Multidetector CT imaging of the thoracic and lumbar spine was performed without intravenous contrast. Multiplanar CT image reconstructions were also generated. RADIATION DOSE REDUCTION: This exam was performed according to the departmental dose-optimization program which includes automated exposure control, adjustment of the mA and/or kV according to patient size and/or use of iterative reconstruction technique.  COMPARISON:  Lumbar spine radiograph 09/12/20 FINDINGS: CT THORACIC SPINE FINDINGS There is a distraction type injury at the T12 L1 level with widening of the anterior disc space (series 9, image 39) and mildly displaced fractures of the posterosuperior endplate of L1 (series 9, image 40) and possibly the anterior inferior endplate T12 (series 9, image 37). There is also a nondisplaced fracture through L1 spinous process (series 7, image 49). There is prevertebral soft tissue stranding along the anterior margin of the T12-L1 disc space, compatible with posttraumatic changes. Alignment: Normal. Vertebrae: See above Paraspinal and other soft tissues: See above Disc levels: No evidence of high-grade spinal canal stenosis. CT LUMBAR SPINE FINDINGS Segmentation: 5 lumbar type vertebrae. Alignment: Normal.  See above for description of T12-L1 disc space Vertebrae: No acute fracture or focal pathologic process.  See above Paraspinal and other soft tissues: Negative. Disc levels: No evidence of high-grade spinal canal stenosis. Mild-to-moderate bilateral neural foraminal narrowing at L4-L5. IMPRESSION: 1. Three column distraction type injury at T12-L1 level with widening of the anterior disc space and mildly displaced fractures of the posterosuperior endplate of L1 and possibly the anterior inferior endplate of T12. Nondisplaced fracture through the L1 spinous process. 2. Prevertebral soft tissue stranding along the anterior margin of the T12-L1 disc space, compatible with posttraumatic changes. Electronically Signed   By: Lorenza Cambridge M.D.   On: 11/18/2022 11:58   CT Head Wo Contrast  Result Date: 11/18/2022 CLINICAL DATA:  Head trauma, minor (Age >= 65y) EXAM: CT HEAD WITHOUT CONTRAST TECHNIQUE: Contiguous axial images were obtained from the base of the skull through the vertex without intravenous contrast. RADIATION DOSE REDUCTION: This exam was performed according to the departmental dose-optimization program which  includes automated exposure control, adjustment of the mA and/or kV according to patient size and/or use of iterative reconstruction technique. COMPARISON:  None Available. FINDINGS: Brain: No evidence of acute infarction, hemorrhage,  hydrocephalus, extra-axial collection or mass lesion/mass effect. Patchy white matter hypodensities, nonspecific but compatible with chronic microvascular ischemic disease. Vascular: No hyperdense vessel.  Calcific atherosclerosis. Skull: No acute fracture. Sinuses/Orbits: Clear sinuses.  No acute focal findings. Other: No mastoid effusions. IMPRESSION: No evidence of acute intracranial abnormality. Electronically Signed   By: Feliberto HartsFrederick S Jones M.D.   On: 11/18/2022 11:42    EKG: Independently reviewed.  A-fib, no acute ST changes.  Assessment/Plan Principal Problem:   L1 vertebral fracture Active Problems:   Pulmonary hypertension   Closed L2 vertebral fracture   Chronic a-fib  (please populate well all problems here in Problem List. (For example, if patient is on BP meds at home and you resume or decide to hold them, it is a problem that needs to be her. Same for CAD, COPD, HLD and so on)  T12-L1 endplate fracture -secondary to mechanical fall. -No focal neurology deficit to indicate any cord or nerve injury -Pain control, neurosurgery to arrange ORIF -Bed rest -Check VitD level -Hx of severe pulmonary hypertension cor pulmonale on chronic oxygen, calculated perioperative 30 days major cardiac risk 6%, medically cleared for ORIF under general anesthesia with acceptable risk.  Anesthesia to evaluate pulmonary hypertension.  Chronic A-fib -Will hold off Eliquis -Unfortunately patient is allergic to heparin product, as a result no chemical DVT prophylaxis for now. -Calculated annual risk for stroke 7.2%, discussed with patient and his family at bedside, made them aware that due to the incoming neurosurgery and history of heparin product allergy, anticoagulation  and chemical DVT options are limited. Will start patient on ASA.  Chronic HFpEF, severe pulmonary hypertension, cor pulmonale -Euvolemic, continue Lasix daily/torsemide MWF regimen -Continue Cardizem metoprolol  IIDM -Hold off home sulfonylurea and metformin -SSI  Gout -Continue allopurinol  DVT prophylaxis: SCD Code Status: Full code Family Communication: Wife and daughter at bedside Disposition Plan: Patient is sick with T12, L1 endplate fracture unstable, requiring neurosurgery intervention, expect more than 2 midnight hospital stay. Consults called: Neurosurgery Admission status: Tele admit   Emeline GeneralPing T Harrold Fitchett MD [facility] Pager [phone]  11/18/2022, 2:57 PM

## 2022-11-18 NOTE — ED Provider Notes (Signed)
[place] [facility]ERGENC[facility] DEPARTMENT AT [facility] Provider Note   CSN: [id number] Arrival date & time: 11/18/22  1050     History  Chief Complaint  Patient presents with   [name] [name] is a 77 y.o. male.  Patient is a 77 year old male with a history of morbid obesity, sleep apnea, diabetes, CHF, pulmonary hypertension, chronic venous stasis on Eliquis who is presenting today after a fall.  Patient reports he was taking the trash out and was going down his steps when he slipped on the last 2 steps causing him to fall forward hitting his car and then landing on the ground.  He thinks he may have hit his head but he is not sure.  He did not have any loss of consciousness denies any chest or abdominal pain but is having mid back pain.  After EMS helped him up he was able to walk and denies any numbness or tingling in his legs.  He also reports scraping up his elbows bilaterally but states that they feel pretty good and he is able to move them without difficulty.  He denies any neck pain.  The history is provided by the patient, the EMS personnel and medical records.  Fall       Home Medications Prior to Admission medications   Medication Sig Start Date End Date Taking? Authorizing Provider  furosemide (LASIX) 20 MG tablet Take 1 tablet (20 mg total) by mouth daily. 10/11/22   Corwin Levins, MD  allopurinol (ZYLOPRIM) 100 MG tablet TAKE 1 TABLET DAILY 01/25/22   Corwin Levins, MD  atorvastatin (LIPITOR) 20 MG tablet TAKE 1 TABLET DAILY 01/25/22   Corwin Levins, MD  cetirizine (ZYRTEC) 10 MG tablet Take 10 mg by mouth at bedtime.    [provider]  cholecalciferol (VITAMIN D3) 25 MCG (1000 UNIT) tablet Take 2,000 Units by mouth daily.    [provider]  cyanocobalamin (,VITAMIN B-12,) 1000 MCG/ML injection Inject 1,000 mcg into the muscle.    [provider]  diltiazem (CARDIZEM CD) 180 MG 24 hr capsule 1 tab by mouth once daily 10/26/22   Corwin Levins, MD  diltiazem [facility]) 180 MG 24 hr capsule  10/03/19   [provider]  ELIQUIS 5 MG TABS tablet TAKE 1 TABLET TWICE A DAY 01/25/22   Lewayne Bunting, MD  glipiZIDE (GLUCOTROL XL) 10 MG 24 hr tablet Take 1 tablet (10 mg total) by mouth daily with breakfast. 07/15/22   Corwin Levins, MD  glucose blood (ONE TOUCH ULTRA TEST) test strip Use to check blood sugars once daily Dx E11.9 05/13/17   Corwin Levins, MD  Lancets MISC Use as directed once daily  E11.9 04/19/17   Corwin Levins, MD  metFORMIN (GLUCOPHAGE-XR) 500 MG 24 hr tablet TAKE 2 TABLETS IN THE MORNING 01/25/22   Corwin Levins, MD  metoprolol tartrate (LOPRESSOR) 25 MG tablet TAKE 1 TABLET(25 MG) BY MOUTH TWICE DAILY 07/12/22   Corwin Levins, MD  Misc Natural Products (TART CHERRY ADVANCED) CAPS Take 1 capsule by mouth daily.    [provider]  OXYGEN Inhale 3-5 L into the lungs daily. 3L Daily  4L Resting  5L Exertion    [provider]  potassium chloride SA (KLOR-CON M) 20 MEQ tablet Take 1 tablet (20 mEq total) by mouth daily. 10/12/22   Corwin Levins, MD  tizanidine (ZANAFLEX) 2 MG capsule Take 1 capsule (  2 mg total) by mouth 3 (three) times daily. 06/20/20   Olive Bass, FNP  torsemide [facility]) 20 MG tablet Take 4 tablets (80mg ) by mouth twice daily on Monday, Wednesday, Friday and 3 tablets (60mg ) by mouth twice daily the other days. 09/30/22   Lewayne Bunting, MD  Zoster Vaccine Adjuvanted [facility]) injection  06/13/19   [provider]      Allergies    Codeine and Heparin    Review of Systems   Review of Systems  Physical Exam Updated Vital Signs BP (!) 166/68   Pulse 77   Temp 98.5 F (36.9 C) (Oral)   Resp (!) 24   Ht 5\' 8"  (1.727 m)   Wt 124.7 kg   SpO2 97%   BMI 41.81 kg/m  Physical Exam Vitals and nursing note reviewed.  Constitutional:      General: He is not in acute distress.    Appearance: He is well-developed.  HENT:     Head: Normocephalic and  atraumatic.  Eyes:     Conjunctiva/sclera: Conjunctivae normal.     Pupils: Pupils are equal, round, and reactive to light.  Cardiovascular:     Rate and Rhythm: Normal rate and regular rhythm.     Heart sounds: No murmur heard. Pulmonary:     Effort: Pulmonary effort is normal. No respiratory distress.     Breath sounds: Normal breath sounds. No wheezing or rales.  Abdominal:     General: There is no distension.     Palpations: Abdomen is soft.     Tenderness: There is no abdominal tenderness. There is no guarding or rebound.  Musculoskeletal:        General: Tenderness present. Normal range of motion.       Arms:     Cervical back: Normal range of motion and neck supple. No tenderness.     Thoracic back: Bony tenderness present.     Lumbar back: Bony tenderness present.       Back:  Skin:    General: Skin is warm and dry.     Findings: No erythema or rash.     Comments: Skin changes consistent with chronic venous stasis in bilateral lower ext.  Neurological:     Mental Status: He is alert and oriented to person, place, and time. Mental status is at baseline.     Sensory: No sensory deficit.     Motor: No weakness.  Psychiatric:        Mood and Affect: Mood normal.        Behavior: Behavior normal.     ED Results / Procedures / Treatments   Labs (all labs ordered are listed, but only abnormal results are displayed) Labs Reviewed  CBC WITH DIFFERENTIAL/PLATELET - Abnormal; Notable for the following components:      Result Value   WBC 15.4 (*)    Neutro Abs 11.6 (*)    Monocytes Absolute 1.2 (*)    All other components within normal limits  COMPREHENSIVE METABOLIC PANEL - Abnormal; Notable for the following components:   Potassium 3.4 (*)    Chloride 97 (*)    Glucose, Bld 137 (*)    Creatinine, Ser 1.26 (*)    Albumin 3.3 (*)    Total Bilirubin 1.7 (*)    GFR, Estimated 59 (*)    All other components within normal limits    EKG EKG  Interpretation  Date/Time:  Thursday November 18 2022 10:58:20 EDT Ventricular Rate:  77 PR  Interval:    QRS Duration: 109 QT Interval:  402 QTC Calculation: 455 R Axis:   126 Text Interpretation: Atrial fibrillation Right axis deviation Low voltage, precordial leads Probable anteroseptal infarct, old No significant change since last tracing Confirmed by Gwyneth Sprout ([id number]) on 11/18/2022 11:16:22 AM  Radiology CT Thoracic Spine Wo Contrast  Result Date: 11/18/2022 CLINICAL DATA:  Trauma EXAM: CT THORACIC, AND LUMBAR SPINE WITHOUT CONTRAST TECHNIQUE: Multidetector CT imaging of the thoracic and lumbar spine was performed without intravenous contrast. Multiplanar CT image reconstructions were also generated. RADIATION DOSE REDUCTION: This exam was performed according to the departmental dose-optimization program which includes automated exposure control, adjustment of the mA and/or kV according to patient size and/or use of iterative reconstruction technique. COMPARISON:  Lumbar spine radiograph 09/12/20 FINDINGS: CT THORACIC SPINE FINDINGS There is a distraction type injury at the T12 L1 level with widening of the anterior disc space (series 9, image 39) and mildly displaced fractures of the posterosuperior endplate of L1 (series 9, image 40) and possibly the anterior inferior endplate T12 (series 9, image 37). There is also a nondisplaced fracture through L1 spinous process (series 7, image 49). There is prevertebral soft tissue stranding along the anterior margin of the T12-L1 disc space, compatible with posttraumatic changes. Alignment: Normal. Vertebrae: See above Paraspinal and other soft tissues: See above Disc levels: No evidence of high-grade spinal canal stenosis. CT LUMBAR SPINE FINDINGS Segmentation: 5 lumbar type vertebrae. Alignment: Normal.  See above for description of T12-L1 disc space Vertebrae: No acute fracture or focal pathologic process.  See above Paraspinal and other soft tissues:  Negative. Disc levels: No evidence of high-grade spinal canal stenosis. Mild-to-moderate bilateral neural foraminal narrowing at L4-L5. IMPRESSION: 1. Three column distraction type injury at T12-L1 level with widening of the anterior disc space and mildly displaced fractures of the posterosuperior endplate of L1 and possibly the anterior inferior endplate of T12. Nondisplaced fracture through the L1 spinous process. 2. Prevertebral soft tissue stranding along the anterior margin of the T12-L1 disc space, compatible with posttraumatic changes. Electronically Signed   By: Lorenza Cambridge M.D.   On: 11/18/2022 11:58   CT Lumbar Spine Wo Contrast  Result Date: 11/18/2022 CLINICAL DATA:  Trauma EXAM: CT THORACIC, AND LUMBAR SPINE WITHOUT CONTRAST TECHNIQUE: Multidetector CT imaging of the thoracic and lumbar spine was performed without intravenous contrast. Multiplanar CT image reconstructions were also generated. RADIATION DOSE REDUCTION: This exam was performed according to the departmental dose-optimization program which includes automated exposure control, adjustment of the mA and/or kV according to patient size and/or use of iterative reconstruction technique. COMPARISON:  Lumbar spine radiograph 09/12/20 FINDINGS: CT THORACIC SPINE FINDINGS There is a distraction type injury at the T12 L1 level with widening of the anterior disc space (series 9, image 39) and mildly displaced fractures of the posterosuperior endplate of L1 (series 9, image 40) and possibly the anterior inferior endplate T12 (series 9, image 37). There is also a nondisplaced fracture through L1 spinous process (series 7, image 49). There is prevertebral soft tissue stranding along the anterior margin of the T12-L1 disc space, compatible with posttraumatic changes. Alignment: Normal. Vertebrae: See above Paraspinal and other soft tissues: See above Disc levels: No evidence of high-grade spinal canal stenosis. CT LUMBAR SPINE FINDINGS Segmentation: 5  lumbar type vertebrae. Alignment: Normal.  See above for description of T12-L1 disc space Vertebrae: No acute fracture or focal pathologic process.  See above Paraspinal and other soft tissues: Negative. Disc levels: No  evidence of high-grade spinal canal stenosis. Mild-to-moderate bilateral neural foraminal narrowing at L4-L5. IMPRESSION: 1. Three column distraction type injury at T12-L1 level with widening of the anterior disc space and mildly displaced fractures of the posterosuperior endplate of L1 and possibly the anterior inferior endplate of T12. Nondisplaced fracture through the L1 spinous process. 2. Prevertebral soft tissue stranding along the anterior margin of the T12-L1 disc space, compatible with posttraumatic changes. Electronically Signed   By: Lorenza Cambridge M.D.   On: 11/18/2022 11:58   CT Head Wo Contrast  Result Date: 11/18/2022 CLINICAL DATA:  Head trauma, minor (Age >= 65y) EXAM: CT HEAD WITHOUT CONTRAST TECHNIQUE: Contiguous axial images were obtained from the base of the skull through the vertex without intravenous contrast. RADIATION DOSE REDUCTION: This exam was performed according to the departmental dose-optimization program which includes automated exposure control, adjustment of the mA and/or kV according to patient size and/or use of iterative reconstruction technique. COMPARISON:  None Available. FINDINGS: Brain: No evidence of acute infarction, hemorrhage, hydrocephalus, extra-axial collection or mass lesion/mass effect. Patchy white matter hypodensities, nonspecific but compatible with chronic microvascular ischemic disease. Vascular: No hyperdense vessel.  Calcific atherosclerosis. Skull: No acute fracture. Sinuses/Orbits: Clear sinuses.  No acute focal findings. Other: No mastoid effusions. IMPRESSION: No evidence of acute intracranial abnormality. Electronically Signed   By: Feliberto Harts M.D.   On: 11/18/2022 11:42    Procedures Procedures    Medications Ordered in  ED Medications - No data to display  ED Course/ Medical Decision Making/ A&P                             Medical Decision Making Amount and/or Complexity of Data Reviewed Independent Historian: EMS External Data Reviewed: notes. Labs: ordered. Decision-making details documented in ED Course. Radiology: ordered and independent interpretation performed. Decision-making details documented in ED Course. ECG/medicine tests: ordered and independent interpretation performed. Decision-making details documented in ED Course.  Risk Decision regarding hospitalization.   Pt with multiple medical problems and comorbidities and presenting today with a complaint that caries a high risk for morbidity and mortality.  Presenting today as a level 2 trauma after a fall down several steps at his home and being on Eliquis.  Patient is not sure if he hit his head or not however on exam he does not have any significant evidence of trauma.  He has normal cervical spine exam.  He does have abrasions on bilateral elbows but no other acute findings for midline back tenderness between the T and L-spine.  Patient currently is neurologically intact.  He was able to ambulate with EMS he can lift his legs and has normal sensation.  No upper extremity symptoms.  However given his significant back tenderness and morbid obesity will do CT to evaluate for fracture.  Also head CT ordered due to anticoagulation and possible injury to the head during the fall.  I have independently visualized and interpreted pt's images today.  Head CT is negative for any evidence of acute bleed.  CT of the thoracic and lumbar spine do show evidence of L1 fracture.  Radiology reports 3 column distraction type injury at T12-L1 with widening of the anterior disc base and mildly displaced fractures of the posterior superior endplate of L1 and inferior endplate of T12 as well as a spinous process fracture.  Also there are some posttraumatic soft tissue  changes.  Radiology did not specifically note that patient had  signs of epidural hematoma.  At this time patient is not complaining of any neurologic symptoms.  Will discuss with neurosurgery for further recommendations.  2:01 PM Dr. Conchita ParisNundkumar will see the pt but he will need surgery.  Pt last took eliquis this morning.  He will need med admission and clearance and they will do surgery most likely on Monday.  Patient needs to be on bedrest but head can be elevated at 30 degrees.  He is to be logrolled and should not stand.  No brace is needed.  I independently interpreted patient's labs and EKG and CBC with leukocytosis of 15,000 which is most likely acute phase reaction from the fall today, hemoglobin stable, CMP without acute findings.  EKG without acute changes in atrial fibrillation.  Will consult the hospitalist and admit for further care.  Findings discussed with the patient and his family member.  They are comfortable with this plan          Final Clinical Impression(s) / ED Diagnoses Final diagnoses:  Fall, initial encounter  Other closed fracture of first lumbar vertebra, initial encounter    Rx / DC Orders ED Discharge Orders     None         Gwyneth SproutPlunkett, Laraya Pestka, MD 11/18/22 1401

## 2022-11-18 NOTE — ED Notes (Signed)
ED TO INPATIENT HANDOFF REPORT  ED Nurse Name and Phone #: [phone]  S Name/Age/Gender [name] [name] 77 y.o. male Room/Bed: 008C/008C  Code Status   Code Status: Full Code  Home/SNF/Other Home Patient oriented to: self, place, time, and situation Is this baseline? Yes   Triage Complete: Triage complete  Chief Complaint L1 vertebral fracture [S32.019A]  Triage Note Coming from home witnessed fall from neighbor denies hitting head this AM down the steps on third from bottom slipped and fell on left side first cal bandaged elbow and thumb refused to come called ems back for back pain no LOC on eliquis.  Baseline 5L and was with SOB   Vitals 148/98 @ 1040 88 HR 18 RR 97% on 5L CBG 197   Allergies Allergies  Allergen Reactions   Codeine     Altered mental status "goofy"   Heparin Rash     Abdominal rash 01/2013    Level of Care/Admitting Diagnosis ED Disposition     ED Disposition  Admit   Condition  --   Comment  Hospital Area: [facility] [[id number]]  Level of Care: Telemetry Medical [104]  May admit patient to [facility] or [facility] if equivalent level of care is available:: No  Covid Evaluation: Asymptomatic - no recent exposure (last 10 days) testing not required  Diagnosis: L1 vertebral fracture [885027]  Admitting Physician: Em[facility] General [[id number]]  Attending Physician: [facility] General [[id number]]  Certification:: I certify this patient will need inpatient services for at least 2 midnights  Estimated Length of Stay: 4          B Medical/Surgery History Past Medical History:  Diagnosis Date   Acute right-sided CHF (congestive heart failure)    a. 01/2013.   Arthritis    "knees and hands; thoracic area of the spine" (01/05/2013)   BRONCHITIS, CHRONIC 01/02/2009   COMMON MIGRAINE    "none since treating for high BP"   Complication of anesthesia    "aspiration pneumonia after hand OR" (01/05/2013)   DIABETES MELLITUS, TYPE II    GOUT     HYPERLIPIDEMIA    HYPERTENSION    Long term (current) use of anticoagulants    Morbid obesity    OBESITY HYPOVENTILATION SYNDROME    a. on home O2.   On home oxygen therapy    OSA (obstructive sleep apnea)    Permanent atrial fibrillation    PROSTATE SPECIFIC ANTIGEN, ELEVATED 06/09/2007   Pulmonary HTN    a. multifactorial including obstructive sleep apnea, obesity hypoventilation syndrome and possible pulmonary venous hypertension.   SKIN RASH 03/12/2010   Unspecified hearing loss    Past Surgical History:  Procedure Laterality Date   APPENDECTOMY  1974   CARDIOVERSION  05/21/2008; ~ 06/2008   HERNIA REPAIR     INGUINAL HERNIA REPAIR Right    RIGHT HEART CATHETERIZATION N/A 01/08/2013   Procedure: RIGHT HEART CATH;  Surgeon: Vesta Mixer, MD;  Location: [facility] CATH LAB;  Service: Cardiovascular;  Laterality: N/A;   TENDON REPAIR Right 2009   "lacerated his tendon and pulley" Dr. Izora Ribas   UMBILICAL HERNIA REPAIR       A IV Location/Drains/Wounds Patient Lines/Drains/Airways Status     Active Line/Drains/Airways     Name Placement date Placement time Site Days   Peripheral IV 11/18/22 18 G Left Antecubital 11/18/22  1255  Antecubital  less than 1   Wound 01/08/13 Other (Comment) Groin Right 01/08/13  1445  Groin  3601            Intake/Output Last 24 hours No intake or output data in the 24 hours ending 11/18/22 1526  Labs/Imaging Results for orders placed or performed during the hospital encounter of 11/18/22 (from the past 48 hour(s))  CBC with Differential/Platelet     Status: Abnormal   Collection Time: 11/18/22 12:28 PM  Result Value Ref Range   WBC 15.4 (H) 4.0 - 10.5 K/uL   RBC 4.80 4.22 - 5.81 MIL/uL   Hemoglobin 13.8 13.0 - 17.0 g/dL   HCT 40.9 81.1 - 91.4 %   MCV 91.0 80.0 - 100.0 fL   MCH 28.8 26.0 - 34.0 pg   MCHC 31.6 30.0 - 36.0 g/dL   RDW 78.2 95.6 - 21.3 %   Platelets 227 150 - 400 K/uL   nRBC 0.0 0.0 - 0.2 %   Neutrophils Relative % 75 %    Neutro Abs 11.6 (H) 1.7 - 7.7 K/uL   Lymphocytes Relative 15 %   Lymphs Abs 2.4 0.7 - 4.0 K/uL   Monocytes Relative 8 %   Monocytes Absolute 1.2 (H) 0.1 - 1.0 K/uL   Eosinophils Relative 1 %   Eosinophils Absolute 0.1 0.0 - 0.5 K/uL   Basophils Relative 1 %   Basophils Absolute 0.1 0.0 - 0.1 K/uL   Immature Granulocytes 0 %   Abs Immature Granulocytes 0.06 0.00 - 0.07 K/uL    Comment: Performed at [facility], [address] [place], [place] [address]  Comprehensive metabolic panel     Status: Abnormal   Collection Time: 11/18/22 12:28 PM  Result Value Ref Range   Sodium 140 135 - 145 mmol/L   Potassium 3.4 (L) 3.5 - 5.1 mmol/L   Chloride 97 (L) 98 - 111 mmol/L   CO2 30 22 - 32 mmol/L   Glucose, Bld 137 (H) 70 - 99 mg/dL    Comment: Glucose reference range applies only to samples taken after fasting for at least 8 hours.   BUN 17 8 - 23 mg/dL   Creatinine, Ser 8.46 (H) 0.61 - 1.24 mg/dL   Calcium 9.0 8.9 - 96.2 mg/dL   Total Protein 7.0 6.5 - 8.1 g/dL   Albumin 3.3 (L) 3.5 - 5.0 g/dL   AST 22 15 - 41 U/L   ALT 12 0 - 44 U/L   Alkaline Phosphatase 69 38 - 126 U/L   Total Bilirubin 1.7 (H) 0.3 - 1.2 mg/dL   GFR, Estimated 59 (L) >60 mL/min    Comment: (NOTE) Calculated using the CKD-EPI Creatinine Equation (2021)    Anion gap 13 5 - 15    Comment: Performed at [facility], [address], [place], [place] [address]   DG Chest 1 View  Result Date: 11/18/2022 CLINICAL DATA:  Fall EXAM: CHEST  1 VIEW COMPARISON:  10/08/2022 FINDINGS: Low lung volumes. Cardiomegaly. No acute airspace disease, pleural effusion or pneumothorax. IMPRESSION: Low lung volumes with cardiomegaly. No acute airspace disease. Electronically Signed   By: Jasmine Pang M.D.   On: 11/18/2022 15:19   CT Thoracic Spine Wo Contrast  Result Date: 11/18/2022 CLINICAL DATA:  Trauma EXAM: CT THORACIC, AND LUMBAR SPINE WITHOUT CONTRAST TECHNIQUE: Multidetector CT imaging of the thoracic and lumbar  spine was performed without intravenous contrast. Multiplanar CT image reconstructions were also generated. RADIATION DOSE REDUCTION: This exam was performed according to the departmental dose-optimization program which includes automated exposure control, adjustment of the mA and/or  kV according to patient size and/or use of iterative reconstruction technique. COMPARISON:  Lumbar spine radiograph 09/12/20 FINDINGS: CT THORACIC SPINE FINDINGS There is a distraction type injury at the T12 L1 level with widening of the anterior disc space (series 9, image 39) and mildly displaced fractures of the posterosuperior endplate of L1 (series 9, image 40) and possibly the anterior inferior endplate T12 (series 9, image 37). There is also a nondisplaced fracture through L1 spinous process (series 7, image 49). There is prevertebral soft tissue stranding along the anterior margin of the T12-L1 disc space, compatible with posttraumatic changes. Alignment: Normal. Vertebrae: See above Paraspinal and other soft tissues: See above Disc levels: No evidence of high-grade spinal canal stenosis. CT LUMBAR SPINE FINDINGS Segmentation: 5 lumbar type vertebrae. Alignment: Normal.  See above for description of T12-L1 disc space Vertebrae: No acute fracture or focal pathologic process.  See above Paraspinal and other soft tissues: Negative. Disc levels: No evidence of high-grade spinal canal stenosis. Mild-to-moderate bilateral neural foraminal narrowing at L4-L5. IMPRESSION: 1. Three column distraction type injury at T12-L1 level with widening of the anterior disc space and mildly displaced fractures of the posterosuperior endplate of L1 and possibly the anterior inferior endplate of T12. Nondisplaced fracture through the L1 spinous process. 2. Prevertebral soft tissue stranding along the anterior margin of the T12-L1 disc space, compatible with posttraumatic changes. Electronically Signed   By: Lorenza Cambridge M.D.   On: 11/18/2022 11:58    CT Lumbar Spine Wo Contrast  Result Date: 11/18/2022 CLINICAL DATA:  Trauma EXAM: CT THORACIC, AND LUMBAR SPINE WITHOUT CONTRAST TECHNIQUE: Multidetector CT imaging of the thoracic and lumbar spine was performed without intravenous contrast. Multiplanar CT image reconstructions were also generated. RADIATION DOSE REDUCTION: This exam was performed according to the departmental dose-optimization program which includes automated exposure control, adjustment of the mA and/or kV according to patient size and/or use of iterative reconstruction technique. COMPARISON:  Lumbar spine radiograph 09/12/20 FINDINGS: CT THORACIC SPINE FINDINGS There is a distraction type injury at the T12 L1 level with widening of the anterior disc space (series 9, image 39) and mildly displaced fractures of the posterosuperior endplate of L1 (series 9, image 40) and possibly the anterior inferior endplate T12 (series 9, image 37). There is also a nondisplaced fracture through L1 spinous process (series 7, image 49). There is prevertebral soft tissue stranding along the anterior margin of the T12-L1 disc space, compatible with posttraumatic changes. Alignment: Normal. Vertebrae: See above Paraspinal and other soft tissues: See above Disc levels: No evidence of high-grade spinal canal stenosis. CT LUMBAR SPINE FINDINGS Segmentation: 5 lumbar type vertebrae. Alignment: Normal.  See above for description of T12-L1 disc space Vertebrae: No acute fracture or focal pathologic process.  See above Paraspinal and other soft tissues: Negative. Disc levels: No evidence of high-grade spinal canal stenosis. Mild-to-moderate bilateral neural foraminal narrowing at L4-L5. IMPRESSION: 1. Three column distraction type injury at T12-L1 level with widening of the anterior disc space and mildly displaced fractures of the posterosuperior endplate of L1 and possibly the anterior inferior endplate of T12. Nondisplaced fracture through the L1 spinous process. 2.  Prevertebral soft tissue stranding along the anterior margin of the T12-L1 disc space, compatible with posttraumatic changes. Electronically Signed   By: Lorenza Cambridge M.D.   On: 11/18/2022 11:58   CT Head Wo Contrast  Result Date: 11/18/2022 CLINICAL DATA:  Head trauma, minor (Age >= 65y) EXAM: CT HEAD WITHOUT CONTRAST TECHNIQUE: Contiguous axial images were obtained  from the base of the skull through the vertex without intravenous contrast. RADIATION DOSE REDUCTION: This exam was performed according to the departmental dose-optimization program which includes automated exposure control, adjustment of the mA and/or kV according to patient size and/or use of iterative reconstruction technique. COMPARISON:  None Available. FINDINGS: Brain: No evidence of acute infarction, hemorrhage, hydrocephalus, extra-axial collection or mass lesion/mass effect. Patchy white matter hypodensities, nonspecific but compatible with chronic microvascular ischemic disease. Vascular: No hyperdense vessel.  Calcific atherosclerosis. Skull: No acute fracture. Sinuses/Orbits: Clear sinuses.  No acute focal findings. Other: No mastoid effusions. IMPRESSION: No evidence of acute intracranial abnormality. Electronically Signed   By: Feliberto HartsFrederick S Jones M.D.   On: 11/18/2022 11:42    Pending Labs Unresulted Labs (From admission, onward)     Start     Ordered   11/19/22 0500  VITAMIN D 25 Hydroxy (Vit-D Deficiency, Fractures)  Tomorrow morning,   R        11/18/22 1508            Vitals/Pain Today's Vitals   11/18/22 1130 11/18/22 1345 11/18/22 1415 11/18/22 1430  BP: (!) 166/68   (!) 153/94  Pulse:  77 64 94  Resp: 16 (!) 24 (!) 21 (!) 24  Temp:      TempSrc:      SpO2: 96% 97% 97% 93%  Weight:      Height:      PainSc:        Isolation Precautions No active isolations  Medications Medications  allopurinol (ZYLOPRIM) tablet 100 mg (has no administration in time range)  atorvastatin (LIPITOR) tablet 20 mg  (has no administration in time range)  diltiazem (CARDIZEM CD) 24 hr capsule 180 mg (has no administration in time range)  furosemide (LASIX) tablet 20 mg (has no administration in time range)  metoprolol tartrate (LOPRESSOR) tablet 25 mg (has no administration in time range)  torsemide (DEMADEX) tablet 80 mg (has no administration in time range)  acetaminophen (TYLENOL) tablet 650 mg (has no administration in time range)    Or  acetaminophen (TYLENOL) suppository 650 mg (has no administration in time range)  oxyCODONE (Oxy IR/ROXICODONE) immediate release tablet 5 mg (has no administration in time range)  HYDROmorphone (DILAUDID) injection 0.5-1 mg (has no administration in time range)  senna-docusate (Senokot-S) tablet 1 tablet (has no administration in time range)  bisacodyl (DULCOLAX) EC tablet 5 mg (has no administration in time range)  insulin aspart (novoLOG) injection 0-15 Units (has no administration in time range)  insulin aspart (novoLOG) injection 0-5 Units (has no administration in time range)  aspirin EC tablet 81 mg (has no administration in time range)  potassium chloride SA (KLOR-CON M) CR tablet 40 mEq (has no administration in time range)  HYDROcodone-acetaminophen (NORCO/VICODIN) 5-325 MG per tablet 1 tablet (1 tablet Oral Given 11/18/22 1423)    Mobility walks     Focused Assessments Fall    R Recommendations: See Admitting Provider Note  Report given to:   Additional Notes: alert oriented lives alone family is very supportive

## 2022-11-18 NOTE — ED Notes (Signed)
Patient transported to CT 

## 2022-11-18 NOTE — Consult Note (Signed)
Chief Complaint   Chief Complaint  Patient presents with   Fall    History of Present Illness  [name] [name] is a 77 y.o. male brought to the hospital via EMS after suffering a fall down a few stairs at home.  Subsequent to this, patient was complaining of primarily low back pain.  Workup in the emergency department did reveal T12-L1 fracture and neurosurgical consultation was requested.  Upon questioning, the patient does report back pain.  He does not report any new numbness tingling or weakness of the lower extremities.  Patient does have severe peripheral vascular disease and some baseline paresthesias especially in the right leg.  Patient has been voiding in the emergency department without complaints of retention or incontinence.  Of note, the patient has an extensive medical history including history of hypertension, diabetes, peripheral vascular disease, congestive heart failure and atrial fibrillation maintained on Eliquis.  Patient also has associated pulmonary hypertension.  He has sleep apnea and utilizes constant home oxygen as well as BiPAP at night.  No report of liver or kidney disease.  No cancer history.  Patient quit smoking years ago.  Past Medical History   Past Medical History:  Diagnosis Date   Acute right-sided CHF (congestive heart failure)    a. 01/2013.   Arthritis    "knees and hands; thoracic area of the spine" (01/05/2013)   BRONCHITIS, CHRONIC 01/02/2009   COMMON MIGRAINE    "none since treating for high BP"   Complication of anesthesia    "aspiration pneumonia after hand OR" (01/05/2013)   DIABETES MELLITUS, TYPE II    GOUT    HYPERLIPIDEMIA    HYPERTENSION    Long term (current) use of anticoagulants    Morbid obesity    OBESITY HYPOVENTILATION SYNDROME    a. on home O2.   On home oxygen therapy    OSA (obstructive sleep apnea)    Permanent atrial fibrillation    PROSTATE SPECIFIC ANTIGEN, ELEVATED 06/09/2007   Pulmonary HTN    a. multifactorial  including obstructive sleep apnea, obesity hypoventilation syndrome and possible pulmonary venous hypertension.   SKIN RASH 03/12/2010   Unspecified hearing loss     Past Surgical History   Past Surgical History:  Procedure Laterality Date   APPENDECTOMY  1974   CARDIOVERSION  05/21/2008; ~ 06/2008   HERNIA REPAIR     INGUINAL HERNIA REPAIR Right    RIGHT HEART CATHETERIZATION N/A 01/08/2013   Procedure: RIGHT HEART CATH;  Surgeon: Vesta MixerPhilip J Nahser, MD;  [facility];  Service: Cardiovascular;  Laterality: N/A;   TENDON REPAIR Right 2009   "lacerated his tendon and pulley" Dr. Izora Ribasoley   UMBILICAL HERNIA REPAIR      Social History   Social History   Tobacco Use   Smoking status: Former    Packs/day: 1.00    Years: 15.00    Additional pack years: 0.00    Total pack years: 15.00    Types: Cigarettes    Quit date: 08/09/1978    Years since quitting: 44.3   Smokeless tobacco: Never   Tobacco comments:    01/05/2013 "quit smoking ~ 40 years ago"  Vaping Use   Vaping Use: Never used  Substance Use Topics   Alcohol use: Yes    Alcohol/week: 0.0 standard drinks of alcohol    Comment: 01/05/2013 "hasn't had a drink since 1980's; never had problem w/it"   Drug use: No    Medications   Prior to Admission  medications   Medication Sig Start Date End Date Taking? Authorizing Provider  furosemide (LASIX) 20 MG tablet Take 1 tablet (20 mg total) by mouth daily. 10/11/22   Corwin Levins, MD  allopurinol (ZYLOPRIM) 100 MG tablet TAKE 1 TABLET DAILY 01/25/22   Corwin Levins, MD  atorvastatin (LIPITOR) 20 MG tablet TAKE 1 TABLET DAILY 01/25/22   Corwin Levins, MD  cetirizine (ZYRTEC) 10 MG tablet Take 10 mg by mouth at bedtime.    [provider]  cholecalciferol (VITAMIN D3) 25 MCG (1000 UNIT) tablet Take 2,000 Units by mouth daily.    [provider]  cyanocobalamin (,VITAMIN B-12,) 1000 MCG/ML injection Inject 1,000 mcg into the muscle.    [provider]   diltiazem (CARDIZEM CD) 180 MG 24 hr capsule 1 tab by mouth once daily 10/26/22   Corwin Levins, MD  diltiazem [facility]) 180 MG 24 hr capsule  10/03/19   [provider]  ELIQUIS 5 MG TABS tablet TAKE 1 TABLET TWICE A DAY 01/25/22   Lewayne Bunting, MD  glipiZIDE (GLUCOTROL XL) 10 MG 24 hr tablet Take 1 tablet (10 mg total) by mouth daily with breakfast. 07/15/22   Corwin Levins, MD  glucose blood (ONE TOUCH ULTRA TEST) test strip Use to check blood sugars once daily Dx E11.9 05/13/17   Corwin Levins, MD  Lancets MISC Use as directed once daily  E11.9 04/19/17   Corwin Levins, MD  metFORMIN (GLUCOPHAGE-XR) 500 MG 24 hr tablet TAKE 2 TABLETS IN THE MORNING 01/25/22   Corwin Levins, MD  metoprolol tartrate (LOPRESSOR) 25 MG tablet TAKE 1 TABLET(25 MG) BY MOUTH TWICE DAILY 07/12/22   Corwin Levins, MD  Misc Natural Products (TART CHERRY ADVANCED) CAPS Take 1 capsule by mouth daily.    [provider]  OXYGEN Inhale 3-5 L into the lungs daily. 3L Daily  4L Resting  5L Exertion    [provider]  potassium chloride SA (KLOR-CON M) 20 MEQ tablet Take 1 tablet (20 mEq total) by mouth daily. 10/12/22   Corwin Levins, MD  tizanidine (ZANAFLEX) 2 MG capsule Take 1 capsule (2 mg total) by mouth 3 (three) times daily. 06/20/20   Olive Bass, FNP  torsemide [facility]) 20 MG tablet Take 4 tablets (80mg ) by mouth twice daily on Monday, Wednesday, Friday and 3 tablets (60mg ) by mouth twice daily the other days. 09/30/22   Lewayne Bunting, MD  Zoster Vaccine Adjuvanted [facility]) injection  06/13/19   [provider]    Allergies   Allergies  Allergen Reactions   Codeine     Altered mental status "goofy"   Heparin Rash     Abdominal rash 01/2013    Review of Systems  ROS  Neurologic Exam  Awake, alert, oriented Memory and concentration grossly intact Speech fluent, appropriate CN grossly intact Motor exam: Upper Extremities Deltoid Bicep Tricep Grip  Right  5/5 5/5 5/5 5/5  Left 5/5 5/5 5/5 5/5   Lower Extremities IP Quad PF DF EHL  Right 5/5 5/5 5/5 5/5 5/5  Left 5/5 5/5 5/5 5/5 5/5   Sensation grossly intact to LT  Imaging  CT of the thoracic and lumbar spine was personally reviewed.  This demonstrates significantly diminished bone mineral density with flowing anterior osteophytes in the thoracic spine suggestive of DISH.  There is a trans discal hyperextension type fracture through the T12-L1 disc space, and an associated fracture through the L1  spinous process suggestive of unstable 3 column fracture.  Impression  - 77 y.o. male with multiple severe medical comorbidities, status post fall with unstable T12-L1 fracture.  Under normal circumstances I would recommend operative reduction and stabilization of this fracture.  Plan  -Would plan on admission to the hospitalist service given his multiple medical comorbidities -Hold Eliquis for now -Tentatively planning on operative stabilization of this fracture on Tuesday, 11/23/2022 pending medical evaluation, optimization, and risk stratification.  I have reviewed the situation including imaging findings with the patient and his daughter and granddaughter at bedside.  Unfortunately he is in a difficult situation with multiple underlying medical comorbidities making the risk of perioperative complication extremely high.  On the other hand, this is an unstable fracture and the risk of neurologic compromise without operative treatment is also quite high.  For now we will hold his Eliquis and see if he can be optimized from a medical standpoint.  All their questions today were answered.   Lisbeth Renshaw, MD [facility]

## 2022-11-19 DIAGNOSIS — S32018A Other fracture of first lumbar vertebra, initial encounter for closed fracture: Secondary | ICD-10-CM | POA: Diagnosis not present

## 2022-11-19 LAB — GLUCOSE, CAPILLARY
Glucose-Capillary: 103 mg/dL — ABNORMAL HIGH (ref 70–99)
Glucose-Capillary: 116 mg/dL — ABNORMAL HIGH (ref 70–99)
Glucose-Capillary: 115 mg/dL — ABNORMAL HIGH (ref 70–99)
Glucose-Capillary: 100 mg/dL — ABNORMAL HIGH (ref 70–99)

## 2022-11-19 LAB — VITAMIN D 25 HYDROXY (VIT D DEFICIENCY, FRACTURES): Vit D, 25-Hydroxy: 71.9 ng/mL (ref 30–100)

## 2022-11-19 MED ORDER — ACETAMINOPHEN 325 MG PO TABS
650.0000 mg | ORAL_TABLET | Freq: Four times a day (QID) | ORAL | Status: DC | PRN
  Administered 2022-11-19 – 2022-11-21 (×4): 650 mg via ORAL
  Filled 2022-11-19 (×4): qty 2

## 2022-11-19 MED ORDER — SENNOSIDES-DOCUSATE SODIUM 8.6-50 MG PO TABS
1.0000 | ORAL_TABLET | Freq: Two times a day (BID) | ORAL | Status: DC
  Administered 2022-11-19 – 2022-12-06 (×29): 1 via ORAL
  Filled 2022-11-19 (×31): qty 1

## 2022-11-19 NOTE — Progress Notes (Signed)
  NEUROSURGERY PROGRESS NOTE   No issues overnight. Pt reports unchanged back pain. No new LE N/T/W.  EXAM:  BP 131/67 (BP Location: Right Arm)   Pulse 79   Temp 98.2 F (36.8 C) (Oral)   Resp 16   Ht 5\' 8"  (1.727 m)   Wt 124.7 kg   SpO2 95%   BMI 41.81 kg/m   Awake, alert, oriented  Speech fluent, appropriate  CN grossly intact  5/5 BUE/BLE   IMPRESSION:  77 y.o. male s/p fall with T12-L1 trans-discal hyper-extension fracture will require operative stabilization. Remains neurologically intact. Multiple medical co-morbidities however it appears overall perioperative risk is acceptable to proceed with surgery given instability of fracture.  PLAN: - Cont supportive care per primary service - Plan on OR for ORIF, T12-L2 stabilization on Tuesday afternoon.   Lisbeth Renshaw, MD [facility]

## 2022-11-19 NOTE — TOC CAGE-AID Note (Signed)
Transition of Care [facility]) - CAGE-AID Screening   Patient Details  Name: [name] [name] MRN: [id number] Date of Birth: 1946-02-22  Transition of Care [facility]) CM/SW Contact:    Leota Sauers, RN Phone Number: 11/19/2022, 8:55 PM   Clinical Narrative:  Reports alcohol use w/o issue, denies other drug use. Education not offered at this time.  CAGE-AID Screening:    Have You Ever Felt You Ought to Cut Down on Your Drinking or Drug Use?: No Have People Annoyed You By Critizing Your Drinking Or Drug Use?: No Have You Felt Bad Or Guilty About Your Drinking Or Drug Use?: No Have You Ever Had a Drink or Used Drugs First Thing In The Morning to Steady Your Nerves or to Get Rid of a Hangover?: No CAGE-AID Score: 0  Substance Abuse Education Offered: No

## 2022-11-19 NOTE — Progress Notes (Signed)
Pt has back fracture. On bed rest. On Turesemide. Frequent urination (2500 ml since 0700). Unable to keep condom cath due to anatomy. Requested order for  purwick/primafit.

## 2022-11-19 NOTE — Progress Notes (Signed)
[name] [name]  GNF:[id number] DOB: [birth year] DOA: 11/18/2022 PCP: Corwin Levins, MD    Brief Narrative:  77 year old with a history of chronic atrial fibrillation on Eliquis, chronic diastolic CHF, severe pulmonary hypertension with cor pulmonale, chronic hypoxic respiratory failure requiring 3-5 L continuous O2 support, HTN, HLD, and OSA who sustained a mechanical fall when he missed a step and fell onto his back and reported to the ER 11/18/2022 with resultant severe back pain.  In the ER CT spine noted T12/L1 level widening of the anterior disc space and mildly displaced fracture of the posterior superior endplate of L1.  The patient was seen by neurosurgery who felt the fracture was unstable and required surgical stabilization.  The patient was admitted to the acute units.  Consultants:  Neurosurgery  Goals of Care:  Code Status: Full Code   DVT prophylaxis: SCDs  Interim Hx: Afebrile.  Vital signs stable.  CBG controlled.  Reports pain is reasonably controlled at the time my visit.  Denies chest pain or shortness of breath.  Assessment & Plan:  T12-L1 endplate spine fracture secondary to mechanical fall Care as per Neurosurgery - plan is to go to the OR 11/23/2022  Chronic atrial fibrillation on Eliquis Anticoagulation on hold -patient reports allergy to heparin products - SCDs for DVT prophy for now   Chronic diastolic CHF -severe pulmonary hypertension with cor pulmonale Continue usual diuretic -euvolemic at present  OHS/Sleep apnea Continue usual home BIPAP regimen  Chronic hypoxic respiratory failure Continue usual supportive oxygen -stable at present  DM2 On oral therapy as outpatient -follow with SSI during this admission  Gout Continue usual allopurinol dose  Obesity - Body mass index is 41.81 kg/m.  Family Communication: Spoke with son at bedside Disposition: Will depend upon performance with physical therapy postoperatively   Objective: Blood pressure 131/67,  pulse 79, temperature 98.2 F (36.8 C), temperature source Oral, resp. rate 16, height  (1.727 m), weight 124.7 kg, SpO2 95 %.  Intake/Output Summary (Last 24 hours) at 11/19/2022 0907 Last data filed at 11/18/2022 2100 Gross per 24 hour  Intake 340 ml  Output --  Net 340 ml   Filed Weights   11/18/22 1104  Weight: 124.7 kg    Examination: General: No acute respiratory distress Lungs: Clear to auscultation bilaterally without wheezes or crackles Cardiovascular: Regular rate and rhythm without murmur gallop or rub normal S1 and S2 Abdomen: Nontender, nondistended, soft, bowel sounds positive, no rebound, no ascites, no appreciable mass Extremities: No significant cyanosis, clubbing, or edema bilateral lower extremities  CBC: Recent Labs  Lab 11/18/22 1228  WBC 15.4*  NEUTROABS 11.6*  HGB 13.8  HCT 43.7  MCV 91.0  PLT 227   Basic Metabolic Panel: Recent Labs  Lab 11/18/22 1228  NA 140  K 3.4*  CL 97*  CO2 30  GLUCOSE 137*  BUN 17  CREATININE 1.26*  CALCIUM 9.0   GFR: Estimated Creatinine Clearance: 64.1 mL/min (A) (by C-G formula based on SCr of 1.26 mg/dL (H)).   Scheduled Meds:  allopurinol  100 mg Oral Daily   aspirin EC  81 mg Oral Daily   atorvastatin  20 mg Oral Daily   diltiazem  180 mg Oral Daily   insulin aspart  0-15 Units Subcutaneous TID WC   insulin aspart  0-5 Units Subcutaneous QHS   metoprolol tartrate  25 mg Oral BID   torsemide  80 mg Oral Once per day on Mon Wed Fri  LOS: 1 day   [name], MD [facility] Office  [phone] Pager - Text Page per Amion  If 7PM-7AM, please contact night-coverage per Amion 11/19/2022, 9:07 AM

## 2022-11-19 NOTE — TOC Initial Note (Signed)
Transition of Care [facility]) - Initial/Assessment Note    Patient Details  Name: [name] [name] MRN: [id number] Date of Birth: [birth year]  Transition of Care [facility]) CM/SW Contact:    Kermit Balo, RN Phone Number: 11/19/2022, 2:43 PM  Clinical Narrative:                 Pt is from home with his daughter and grandson. Granddaughter states someone is with him most of the time.  Daughter provides needed transportation.  Daughter oversees his medications and they deny any issues.  Pt is on 6L [place] at home of oxygen through [employer].  Plan is for OR next week. Will need therapy evals afterward.  TOC following.  Expected Discharge Plan:  (to be determined) Barriers to Discharge: Continued Medical Work up   Patient Goals and CMS Choice            Expected Discharge Plan and Services   Discharge Planning Services: CM Consult   Living arrangements for the past 2 months: Single Family Home                                      Prior Living Arrangements/Services Living arrangements for the past 2 months: Single Family Home Lives with:: Adult Children Patient language and need for interpreter reviewed:: Yes Do you feel safe going back to the place where you live?: Yes      Need for Family Participation in Patient Care: Yes (Comment) Care giver support system in place?: Yes (comment) Current home services: DME (oxygen/ cpap/) Criminal Activity/Legal Involvement Pertinent to Current Situation/Hospitalization: No - Comment as needed  Activities of Daily Living      Permission Sought/Granted                  Emotional Assessment Appearance:: Appears stated age Attitude/Demeanor/Rapport: Engaged Affect (typically observed): Accepting Orientation: : Oriented to Self, Oriented to Place, Oriented to Situation   Psych Involvement: No (comment)  Admission diagnosis:  L1 vertebral fracture [S32.019A] Fall, initial encounter [W19.XXXA] Other closed fracture of first  lumbar vertebra, initial encounter [S32.018A] Patient Active Problem List   Diagnosis Date Noted   Closed L2 vertebral fracture 11/18/2022   Chronic a-fib 11/18/2022   L1 vertebral fracture 11/18/2022   Subacute cough 10/10/2022   Fatigue 10/08/2022   Cellulitis of right leg 10/08/2022   Chronic respiratory failure with hypoxia and hypercapnia related to obesity hypoventilation syndrome 03/19/2022   Chronic diastolic CHF (congestive heart failure) 03/19/2022   Actinic keratosis 11/27/2021   Sebaceous cyst 11/27/2021   Urinary frequency 10/17/2021   Left arm swelling 01/18/2021   Vitamin D deficiency 10/10/2020   B12 deficiency 10/10/2020   Lower extremity weakness 09/12/2020   Lesion of right ear 04/13/2020   Arthritis of right acromioclavicular joint 04/02/2020   Right rotator cuff tear 04/02/2020   Greater trochanteric bursitis, right 08/14/2019   Tick bite, infected 12/19/2018   Degenerative arthritis of right knee 02/21/2017   Right knee pain 02/15/2017   Volume overload 12/23/2015   OSA treated with BiPAP 12/23/2015   Pulmonary hypertension 12/23/2015   Gout 12/23/2015   Cellulitis of leg, right 02/14/2015   Allergic rhinitis, cause unspecified 11/21/2013   Chronic cor pulmonale (HCC) 12/22/2012   DOE (dyspnea on exertion) 12/20/2012   Left knee pain 07/11/2012   Increased prostate specific antigen (PSA) velocity 01/06/2012   Encounter for well adult exam  with abnormal findings 12/25/2010   Long term (current) use of anticoagulants 11/06/2010   HEMATOCHEZIA 06/26/2010   BRONCHITIS, CHRONIC 01/02/2009   Obesity hypoventilation syndrome (HCC) 11/26/2008   Permanent atrial fibrillation 10/16/2008   PERIPHERAL EDEMA 01/05/2008   Non-insulin dependent type 2 diabetes mellitus 06/09/2007   COMMON MIGRAINE 06/09/2007   Unspecified hearing loss 06/09/2007   PROSTATE SPECIFIC ANTIGEN, ELEVATED 06/09/2007   Hyperlipidemia 02/18/2007   Severe obesity (BMI >= 40) 02/18/2007    Essential hypertension 02/18/2007   PCP:  Corwin Levins, MD Pharmacy:   [facility] [phone] - [place], [place] - [address] AT [facility] [address] [place] [address] Phone: [phone] Fax: [phone]  [facility] - [place], [place] - [address] [place] [address] Phone: [phone] Fax: [phone]  [facility] [phone] - [place],  - [address] [place] [address] Phone: [phone] Fax: [phone]     Social Determinants of Health (SDOH) Social History: SDOH Screenings   Depression (PHQ2-9): Low Risk  (10/08/2022)  Tobacco Use: Medium Risk (11/18/2022)   SDOH Interventions:     Readmission Risk Interventions     No data to display

## 2022-11-19 NOTE — Plan of Care (Signed)
  Problem: Skin Integrity: Goal: Risk for impaired skin integrity will decrease Outcome: Progressing   Problem: Education: Goal: Knowledge of General Education information will improve Description: Including pain rating scale, medication(s)/side effects and non-pharmacologic comfort measures Outcome: Progressing   Problem: Activity: Goal: Risk for activity intolerance will decrease Outcome: Progressing   Problem: Pain Managment: Goal: General experience of comfort will improve Outcome: Progressing   Problem: Safety: Goal: Ability to remain free from injury will improve Outcome: Progressing   Problem: Skin Integrity: Goal: Risk for impaired skin integrity will decrease Outcome: Progressing

## 2022-11-20 ENCOUNTER — Inpatient Hospital Stay (HOSPITAL_COMMUNITY): Payer: Medicare Other

## 2022-11-20 DIAGNOSIS — S32018A Other fracture of first lumbar vertebra, initial encounter for closed fracture: Secondary | ICD-10-CM | POA: Diagnosis not present

## 2022-11-20 LAB — BASIC METABOLIC PANEL
Anion gap: 7 (ref 5–15)
BUN: 17 mg/dL (ref 8–23)
CO2: 33 mmol/L — ABNORMAL HIGH (ref 22–32)
Calcium: 8.7 mg/dL — ABNORMAL LOW (ref 8.9–10.3)
Chloride: 99 mmol/L (ref 98–111)
Creatinine, Ser: 1.34 mg/dL — ABNORMAL HIGH (ref 0.61–1.24)
GFR, Estimated: 55 mL/min — ABNORMAL LOW (ref >60)
Glucose, Bld: 113 mg/dL — ABNORMAL HIGH (ref 70–99)
Potassium: 3.6 mmol/L (ref 3.5–5.1)
Sodium: 139 mmol/L (ref 135–145)

## 2022-11-20 LAB — GLUCOSE, CAPILLARY
Glucose-Capillary: 101 mg/dL — ABNORMAL HIGH (ref 70–99)
Glucose-Capillary: 106 mg/dL — ABNORMAL HIGH (ref 70–99)
Glucose-Capillary: 145 mg/dL — ABNORMAL HIGH (ref 70–99)

## 2022-11-20 LAB — CBC
HCT: 41.6 % (ref 39.0–52.0)
Hemoglobin: 13.2 g/dL (ref 13.0–17.0)
MCH: 28.8 pg (ref 26.0–34.0)
MCHC: 31.7 g/dL (ref 30.0–36.0)
MCV: 90.8 fL (ref 80.0–100.0)
Platelets: 205 10*3/uL (ref 150–400)
RBC: 4.58 MIL/uL (ref 4.22–5.81)
RDW: 15.4 % (ref 11.5–15.5)
WBC: 12.9 10*3/uL — ABNORMAL HIGH (ref 4.0–10.5)
nRBC: 0 % (ref 0.0–0.2)

## 2022-11-20 LAB — MAGNESIUM: Magnesium: 2 mg/dL (ref 1.7–2.4)

## 2022-11-20 NOTE — Progress Notes (Signed)
Subjective: Patient reports moderate back pain but no radicular pain in his legs.   Objective: Vital signs in last 24 hours: Temp:  [98.1 F (36.7 C)-99.9 F (37.7 C)] 98.1 F (36.7 C) (04/13 0728) Pulse Rate:  [79-95] 88 (04/13 0728) Resp:  [16-20] 16 (04/13 0728) BP: (117-143)/(66-80) 129/72 (04/13 0728) SpO2:  [90 %-98 %] 98 % (04/13 0728)  Intake/Output from previous day: 04/12 0701 - 04/13 0700 In: 360 [P.O.:360] Out: 3300 [Urine:3300] Intake/Output this shift: No intake/output data recorded.  Neurologic: Grossly normal  Lab Results: Lab Results  Component Value Date   WBC 12.9 (H) 11/20/2022   HGB 13.2 11/20/2022   HCT 41.6 11/20/2022   MCV 90.8 11/20/2022   PLT 205 11/20/2022   Lab Results  Component Value Date   INR 1.25 01/09/2013   PROTIME 16.5 01/17/2009   BMET Lab Results  Component Value Date   NA 139 11/20/2022   K 3.6 11/20/2022   CL 99 11/20/2022   CO2 33 (H) 11/20/2022   GLUCOSE 113 (H) 11/20/2022   BUN 17 11/20/2022   CREATININE 1.34 (H) 11/20/2022   CALCIUM 8.7 (L) 11/20/2022    Studies/Results: DG Chest 1 View  Result Date: 11/18/2022 CLINICAL DATA:  Fall EXAM: CHEST  1 VIEW COMPARISON:  10/08/2022 FINDINGS: Low lung volumes. Cardiomegaly. No acute airspace disease, pleural effusion or pneumothorax. IMPRESSION: Low lung volumes with cardiomegaly. No acute airspace disease. Electronically Signed   By: Jasmine Pang M.D.   On: 11/18/2022 15:19   CT Thoracic Spine Wo Contrast  Result Date: 11/18/2022 CLINICAL DATA:  Trauma EXAM: CT THORACIC, AND LUMBAR SPINE WITHOUT CONTRAST TECHNIQUE: Multidetector CT imaging of the thoracic and lumbar spine was performed without intravenous contrast. Multiplanar CT image reconstructions were also generated. RADIATION DOSE REDUCTION: This exam was performed according to the departmental dose-optimization program which includes automated exposure control, adjustment of the mA and/or kV according to patient  size and/or use of iterative reconstruction technique. COMPARISON:  Lumbar spine radiograph 09/12/20 FINDINGS: CT THORACIC SPINE FINDINGS There is a distraction type injury at the T12 L1 level with widening of the anterior disc space (series 9, image 39) and mildly displaced fractures of the posterosuperior endplate of L1 (series 9, image 40) and possibly the anterior inferior endplate T12 (series 9, image 37). There is also a nondisplaced fracture through L1 spinous process (series 7, image 49). There is prevertebral soft tissue stranding along the anterior margin of the T12-L1 disc space, compatible with posttraumatic changes. Alignment: Normal. Vertebrae: See above Paraspinal and other soft tissues: See above Disc levels: No evidence of high-grade spinal canal stenosis. CT LUMBAR SPINE FINDINGS Segmentation: 5 lumbar type vertebrae. Alignment: Normal.  See above for description of T12-L1 disc space Vertebrae: No acute fracture or focal pathologic process.  See above Paraspinal and other soft tissues: Negative. Disc levels: No evidence of high-grade spinal canal stenosis. Mild-to-moderate bilateral neural foraminal narrowing at L4-L5. IMPRESSION: 1. Three column distraction type injury at T12-L1 level with widening of the anterior disc space and mildly displaced fractures of the posterosuperior endplate of L1 and possibly the anterior inferior endplate of T12. Nondisplaced fracture through the L1 spinous process. 2. Prevertebral soft tissue stranding along the anterior margin of the T12-L1 disc space, compatible with posttraumatic changes. Electronically Signed   By: Lorenza Cambridge M.D.   On: 11/18/2022 11:58   CT Lumbar Spine Wo Contrast  Result Date: 11/18/2022 CLINICAL DATA:  Trauma EXAM: CT THORACIC, AND LUMBAR SPINE WITHOUT CONTRAST  TECHNIQUE: Multidetector CT imaging of the thoracic and lumbar spine was performed without intravenous contrast. Multiplanar CT image reconstructions were also generated.  RADIATION DOSE REDUCTION: This exam was performed according to the departmental dose-optimization program which includes automated exposure control, adjustment of the mA and/or kV according to patient size and/or use of iterative reconstruction technique. COMPARISON:  Lumbar spine radiograph 09/12/20 FINDINGS: CT THORACIC SPINE FINDINGS There is a distraction type injury at the T12 L1 level with widening of the anterior disc space (series 9, image 39) and mildly displaced fractures of the posterosuperior endplate of L1 (series 9, image 40) and possibly the anterior inferior endplate T12 (series 9, image 37). There is also a nondisplaced fracture through L1 spinous process (series 7, image 49). There is prevertebral soft tissue stranding along the anterior margin of the T12-L1 disc space, compatible with posttraumatic changes. Alignment: Normal. Vertebrae: See above Paraspinal and other soft tissues: See above Disc levels: No evidence of high-grade spinal canal stenosis. CT LUMBAR SPINE FINDINGS Segmentation: 5 lumbar type vertebrae. Alignment: Normal.  See above for description of T12-L1 disc space Vertebrae: No acute fracture or focal pathologic process.  See above Paraspinal and other soft tissues: Negative. Disc levels: No evidence of high-grade spinal canal stenosis. Mild-to-moderate bilateral neural foraminal narrowing at L4-L5. IMPRESSION: 1. Three column distraction type injury at T12-L1 level with widening of the anterior disc space and mildly displaced fractures of the posterosuperior endplate of L1 and possibly the anterior inferior endplate of T12. Nondisplaced fracture through the L1 spinous process. 2. Prevertebral soft tissue stranding along the anterior margin of the T12-L1 disc space, compatible with posttraumatic changes. Electronically Signed   By: Lorenza Cambridge M.D.   On: 11/18/2022 11:58   CT Head Wo Contrast  Result Date: 11/18/2022 CLINICAL DATA:  Head trauma, minor (Age >= 65y) EXAM: CT HEAD  WITHOUT CONTRAST TECHNIQUE: Contiguous axial images were obtained from the base of the skull through the vertex without intravenous contrast. RADIATION DOSE REDUCTION: This exam was performed according to the departmental dose-optimization program which includes automated exposure control, adjustment of the mA and/or kV according to patient size and/or use of iterative reconstruction technique. COMPARISON:  None Available. FINDINGS: Brain: No evidence of acute infarction, hemorrhage, hydrocephalus, extra-axial collection or mass lesion/mass effect. Patchy white matter hypodensities, nonspecific but compatible with chronic microvascular ischemic disease. Vascular: No hyperdense vessel.  Calcific atherosclerosis. Skull: No acute fracture. Sinuses/Orbits: Clear sinuses.  No acute focal findings. Other: No mastoid effusions. IMPRESSION: No evidence of acute intracranial abnormality. Electronically Signed   By: Feliberto Harts M.D.   On: 11/18/2022 11:42    Assessment/Plan: T12-L1 hyper-extension injury. Planning for surgical stabilization on Tuesday with Dr. Conchita Paris. Continue pain control for now   LOS: 2 days    Tiana Loft [facility] 11/20/2022, 9:33 AM

## 2022-11-20 NOTE — Plan of Care (Signed)
Discussed the use of incentive spirometer, turning and repositioning to prevent infection and the use of SCDs to prevent blood clots.   Problem: Skin Integrity: Goal: Risk for impaired skin integrity will decrease Outcome: Progressing   Problem: Activity: Goal: Risk for activity intolerance will decrease Outcome: Progressing   Problem: Pain Managment: Goal: General experience of comfort will improve Outcome: Progressing   Problem: Skin Integrity: Goal: Risk for impaired skin integrity will decrease Outcome: Progressing

## 2022-11-20 NOTE — Progress Notes (Signed)
[name] [name]  EMV:[id number] DOB: Aug 11, 1945 DOA: 11/18/2022 PCP: Corwin Levins, MD    Brief Narrative:  77 year old with a history of chronic atrial fibrillation on Eliquis, chronic diastolic CHF, severe pulmonary hypertension with cor pulmonale, chronic hypoxic respiratory failure requiring 3-5 L continuous O2 support, HTN, HLD, and OSA who sustained a mechanical fall when he missed a step and fell onto his back and reported to the ER 11/18/2022 with resultant severe back pain.  In the ER CT spine noted T12/L1 level widening of the anterior disc space and mildly displaced fracture of the posterior superior endplate of L1.  The patient was seen by neurosurgery who felt the fracture was unstable and required surgical stabilization.  The patient was admitted to the acute units.  Consultants:  Neurosurgery  Goals of Care:  Code Status: Full Code   DVT prophylaxis: SCDs  Interim Hx: No acute events recorded since my last visit.  Afebrile.  Vital signs stable.  CBG well-controlled.  No new complaints.  Ongoing severe pain as expected.  Patient is stoic.  I have encouraged him to utilize prescribed pain medications when needed.  Assessment & Plan:  T12-L1 endplate spine fracture secondary to mechanical fall Care as per Neurosurgery - plan is to go to the OR 11/23/2022  Chronic atrial fibrillation on Eliquis Anticoagulation on hold -patient reports allergy to heparin products - SCDs for DVT prophy for now -rate well-controlled at present  Chronic diastolic CHF -severe pulmonary hypertension with cor pulmonale Continue usual diuretic - euvolemic at present  OHS/Sleep apnea Continue usual home BIPAP regimen  Chronic hypoxic respiratory failure Continue usual supportive oxygen at baseline of 3-5LPM per [place]  -stable at present  DM2 On oral therapy as outpatient -follow with SSI during this admission -CBG stable presently  Gout Continue usual allopurinol dose  Obesity - Body mass index is  41.81 kg/m.  Family Communication: Spoke with daughter at bedside Disposition: Will depend upon performance with physical therapy postoperatively   Objective: Blood pressure 129/72, pulse 88, temperature 98.1 F (36.7 C), temperature source Oral, resp. rate 16, height 5\' 8"  (1.727 m), weight 124.7 kg, SpO2 98 %.  Intake/Output Summary (Last 24 hours) at 11/20/2022 0929 Last data filed at 11/20/2022 0500 Gross per 24 hour  Intake 360 ml  Output 2300 ml  Net -1940 ml    Filed Weights   11/18/22 1104  Weight: 124.7 kg    Examination: General: No acute respiratory distress Lungs: Clear to auscultation bilaterally without wheezes or crackles Cardiovascular: Regular rate and rhythm without murmur  Abdomen: Nontender, nondistended, soft, bowel sounds positive, no rebound Extremities: No significant edema bilateral lower extremities  CBC: Recent Labs  Lab 11/18/22 1228 11/20/22 0325  WBC 15.4* 12.9*  NEUTROABS 11.6*  --   HGB 13.8 13.2  HCT 43.7 41.6  MCV 91.0 90.8  PLT 227 205    Basic Metabolic Panel: Recent Labs  Lab 11/18/22 1228 11/20/22 0325  NA 140 139  K 3.4* 3.6  CL 97* 99  CO2 30 33*  GLUCOSE 137* 113*  BUN 17 17  CREATININE 1.26* 1.34*  CALCIUM 9.0 8.7*  MG  --  2.0    GFR: Estimated Creatinine Clearance: 60.3 mL/min (A) (by C-G formula based on SCr of 1.34 mg/dL (H)).   Scheduled Meds:  allopurinol  100 mg Oral Daily   atorvastatin  20 mg Oral Daily   diltiazem  180 mg Oral Daily   insulin aspart  0-15 Units Subcutaneous  TID WC   insulin aspart  0-5 Units Subcutaneous QHS   metoprolol tartrate  25 mg Oral BID   senna-docusate  1 tablet Oral BID   torsemide  80 mg Oral Once per day on Mon Wed Fri     LOS: 2 days   [name], MD [facility] Office  [phone] Pager - Text Page per Loretha Stapler  If 7PM-7AM, please contact night-coverage per Amion 11/20/2022, 9:29 AM

## 2022-11-21 DIAGNOSIS — S32018A Other fracture of first lumbar vertebra, initial encounter for closed fracture: Secondary | ICD-10-CM | POA: Diagnosis not present

## 2022-11-21 LAB — GLUCOSE, CAPILLARY
Glucose-Capillary: 144 mg/dL — ABNORMAL HIGH (ref 70–99)
Glucose-Capillary: 133 mg/dL — ABNORMAL HIGH (ref 70–99)
Glucose-Capillary: 148 mg/dL — ABNORMAL HIGH (ref 70–99)
Glucose-Capillary: 140 mg/dL — ABNORMAL HIGH (ref 70–99)

## 2022-11-21 LAB — BASIC METABOLIC PANEL
Anion gap: 9 (ref 5–15)
BUN: 23 mg/dL (ref 8–23)
CO2: 32 mmol/L (ref 22–32)
Calcium: 8.8 mg/dL — ABNORMAL LOW (ref 8.9–10.3)
Chloride: 96 mmol/L — ABNORMAL LOW (ref 98–111)
Creatinine, Ser: 1.38 mg/dL — ABNORMAL HIGH (ref 0.61–1.24)
GFR, Estimated: 53 mL/min — ABNORMAL LOW (ref >60)
Glucose, Bld: 123 mg/dL — ABNORMAL HIGH (ref 70–99)
Potassium: 4.7 mmol/L (ref 3.5–5.1)
Sodium: 137 mmol/L (ref 135–145)

## 2022-11-21 MED ORDER — OXYCODONE HCL 5 MG PO TABS
5.0000 mg | ORAL_TABLET | ORAL | Status: DC | PRN
  Administered 2022-11-21 – 2022-11-25 (×5): 5 mg via ORAL
  Filled 2022-11-21 (×6): qty 1

## 2022-11-21 NOTE — Progress Notes (Signed)
NEUROSURGERY PROGRESS NOTE  Doing ok, still having quite a bit of back pain. Continue pain control. Surgery scheduled for Tuesday  Temp:  [98.2 F (36.8 C)-99 F (37.2 C)] 98.2 F (36.8 C) (04/14 0339) Pulse Rate:  [72-94] 85 (04/14 0339) Resp:  [16-18] 18 (04/14 0339) BP: (121-138)/(70-81) 131/81 (04/14 0339) SpO2:  [90 %-94 %] 91 % (04/14 0823) FiO2 (%):  [55 %] 55 % (04/14 0823)   Sherryl Manges, NP 11/21/2022 9:13 AM

## 2022-11-21 NOTE — Progress Notes (Signed)
[name] [name]  ZOX:[id number] DOB: Jul 08, 1946 DOA: 11/18/2022 PCP: Corwin Levins, MD    Brief Narrative:  77 year old with a history of chronic atrial fibrillation on Eliquis, chronic diastolic CHF, severe pulmonary hypertension with cor pulmonale, chronic hypoxic respiratory failure requiring 3-5 L continuous O2 support, HTN, HLD, and OSA who sustained a mechanical fall when he missed a step and fell onto his back and reported to the ER 11/18/2022 with resultant severe back pain.  In the ER CT spine noted T12/L1 level widening of the anterior disc space and mildly displaced fracture of the posterior superior endplate of L1.  The patient was seen by neurosurgery who felt the fracture was unstable and required surgical stabilization.  The patient was admitted to the acute units.  Consultants:  Neurosurgery  Goals of Care:  Code Status: Full Code   DVT prophylaxis: SCDs  Interim Hx: No acute events recorded overnight.  Afebrile.  Vital signs stable.  Plain film x-rays of the right ankle were unrevealing.  Reports the pain medication is wearing off before he is able to get the next dose.  Assessment & Plan:  T12-L1 endplate spine fracture secondary to mechanical fall Care as per Neurosurgery - plan is to go to the OR 11/23/2022 -increase frequency of pain medication today  Chronic atrial fibrillation on Eliquis Anticoagulation on hold -patient reports allergy to heparin products - SCDs for DVT prophy for now -rate well-controlled at present  Chronic diastolic CHF -severe pulmonary hypertension with cor pulmonale Continue usual diuretic - euvolemic at present  OHS/Sleep apnea Continue usual home BIPAP regimen  Chronic hypoxic respiratory failure Continue usual supportive oxygen at baseline of 3-5LPM per [place]  -stable at present -utilizing facemask as needed as patient often mouth breathes  DM2 On oral therapy as outpatient -follow with SSI during this admission -CBG stable  presently  Gout Continue usual allopurinol dose  Obesity - Body mass index is 41.81 kg/m.  Family Communication: Spoke with daughter at bedside Disposition: Will depend upon performance with physical therapy postoperatively   Objective: Blood pressure 131/81, pulse 85, temperature 98.2 F (36.8 C), temperature source Oral, resp. rate 18, height  (1.727 m), weight 124.7 kg, SpO2 91 %.  Intake/Output Summary (Last 24 hours) at 11/21/2022 0956 Last data filed at 11/21/2022 0150 Gross per 24 hour  Intake --  Output 1000 ml  Net -1000 ml    Filed Weights   11/18/22 1104  Weight: 124.7 kg    Examination: General: No acute respiratory distress Lungs: Clear to auscultation bilaterally  Cardiovascular: Regular rate and rhythm without murmur  Abdomen: Nontender, nondistended, soft, bowel sounds positive, no rebound Extremities: No significant edema bilateral LE  CBC: Recent Labs  Lab 11/18/22 1228 11/20/22 0325  WBC 15.4* 12.9*  NEUTROABS 11.6*  --   HGB 13.8 13.2  HCT 43.7 41.6  MCV 91.0 90.8  PLT 227 205    Basic Metabolic Panel: Recent Labs  Lab 11/18/22 1228 11/20/22 0325 11/21/22 0310  NA 140 139 137  K 3.4* 3.6 4.7  CL 97* 99 96*  CO2 30 33* 32  GLUCOSE 137* 113* 123*  BUN CREATININE 1.26* 1.34* 1.38*  CALCIUM 9.0 8.7* 8.8*  MG  --  2.0  --     GFR: Estimated Creatinine Clearance: 58.6 mL/min (A) (by C-G formula based on SCr of 1.38 mg/dL (H)).   Scheduled Meds:  allopurinol  100 mg Oral Daily   atorvastatin  20 mg  Oral Daily   diltiazem  180 mg Oral Daily   insulin aspart  0-15 Units Subcutaneous TID WC   insulin aspart  0-5 Units Subcutaneous QHS   metoprolol tartrate  25 mg Oral BID   senna-docusate  1 tablet Oral BID   torsemide  80 mg Oral Once per day on Mon Wed Fri     LOS: 3 days   [name], MD [facility] Office  [phone] Pager - Text Page per Loretha Stapler  If 7PM-7AM, please contact night-coverage  per Amion 11/21/2022, 9:56 AM

## 2022-11-22 ENCOUNTER — Inpatient Hospital Stay (HOSPITAL_COMMUNITY): Payer: Medicare Other

## 2022-11-22 DIAGNOSIS — S32018A Other fracture of first lumbar vertebra, initial encounter for closed fracture: Secondary | ICD-10-CM | POA: Diagnosis not present

## 2022-11-22 LAB — BASIC METABOLIC PANEL
Anion gap: 11 (ref 5–15)
BUN: 28 mg/dL — ABNORMAL HIGH (ref 8–23)
CO2: 31 mmol/L (ref 22–32)
Calcium: 9.8 mg/dL (ref 8.9–10.3)
Chloride: 100 mmol/L (ref 98–111)
Creatinine, Ser: 1.28 mg/dL — ABNORMAL HIGH (ref 0.61–1.24)
GFR, Estimated: 58 mL/min — ABNORMAL LOW (ref >60)
Glucose, Bld: 148 mg/dL — ABNORMAL HIGH (ref 70–99)
Potassium: 4 mmol/L (ref 3.5–5.1)
Sodium: 142 mmol/L (ref 135–145)

## 2022-11-22 LAB — BLOOD GAS, ARTERIAL
Acid-Base Excess: 11.6 mmol/L — ABNORMAL HIGH (ref 0.0–2.0)
Bicarbonate: 39.9 mmol/L — ABNORMAL HIGH (ref 20.0–28.0)
Drawn by: 68289
O2 Saturation: 97.6 %
Patient temperature: 37
pCO2 arterial: 69 mmHg (ref 32–48)
pH, Arterial: 7.37 (ref 7.35–7.45)
pO2, Arterial: 81 mmHg — ABNORMAL LOW (ref 83–108)

## 2022-11-22 LAB — GLUCOSE, CAPILLARY
Glucose-Capillary: 153 mg/dL — ABNORMAL HIGH (ref 70–99)
Glucose-Capillary: 143 mg/dL — ABNORMAL HIGH (ref 70–99)
Glucose-Capillary: 131 mg/dL — ABNORMAL HIGH (ref 70–99)

## 2022-11-22 LAB — PROCALCITONIN: Procalcitonin: 0.24 ng/mL

## 2022-11-22 LAB — TYPE AND SCREEN
ABO/RH(D): O NEG
Antibody Screen: NEGATIVE

## 2022-11-22 LAB — ABO/RH: ABO/RH(D): O NEG

## 2022-11-22 LAB — MRSA NEXT GEN BY PCR, NASAL: MRSA by PCR Next Gen: NOT DETECTED

## 2022-11-22 LAB — BRAIN NATRIURETIC PEPTIDE: B Natriuretic Peptide: 158.4 pg/mL — ABNORMAL HIGH (ref 0.0–100.0)

## 2022-11-22 MED ORDER — ALBUTEROL SULFATE (2.5 MG/3ML) 0.083% IN NEBU: 2.5000 mg | INHALATION_SOLUTION | Freq: Four times a day (QID) | RESPIRATORY_TRACT | Status: DC | PRN

## 2022-11-22 MED ORDER — FUROSEMIDE 10 MG/ML IJ SOLN: 40.0000 mg | Freq: Two times a day (BID) | INTRAMUSCULAR | Status: DC

## 2022-11-22 MED ORDER — TORSEMIDE 20 MG PO TABS
80.0000 mg | ORAL_TABLET | Freq: Three times a day (TID) | ORAL | Status: DC
  Administered 2022-11-22 – 2022-11-23 (×4): 80 mg via ORAL
  Filled 2022-11-22 (×3): qty 4

## 2022-11-22 MED ORDER — CHLORHEXIDINE GLUCONATE CLOTH 2 % EX PADS
6.0000 | MEDICATED_PAD | Freq: Once | CUTANEOUS | Status: AC
  Administered 2022-11-23: 6 via TOPICAL

## 2022-11-22 MED ORDER — CEFAZOLIN IN SODIUM CHLORIDE 3-0.9 GM/100ML-% IV SOLN
3.0000 g | INTRAVENOUS | Status: AC
  Administered 2022-11-23: 3 g via INTRAVENOUS
  Filled 2022-11-22 (×2): qty 100

## 2022-11-22 MED ORDER — TORSEMIDE 20 MG PO TABS: 80.0000 mg | ORAL_TABLET | Freq: Three times a day (TID) | ORAL | Status: DC

## 2022-11-22 MED ORDER — CHLORHEXIDINE GLUCONATE CLOTH 2 % EX PADS
6.0000 | MEDICATED_PAD | Freq: Once | CUTANEOUS | Status: AC
  Administered 2022-11-22: 6 via TOPICAL

## 2022-11-22 MED ORDER — FUROSEMIDE 10 MG/ML IJ SOLN
80.0000 mg | Freq: Once | INTRAMUSCULAR | Status: DC
  Filled 2022-11-22: qty 8

## 2022-11-22 MED ORDER — CHLORHEXIDINE GLUCONATE CLOTH 2 % EX PADS: 6.0000 | MEDICATED_PAD | Freq: Once | CUTANEOUS | Status: DC

## 2022-11-22 NOTE — Progress Notes (Signed)
See new orders from [facility] MD.

## 2022-11-22 NOTE — Progress Notes (Signed)
Pt stable and tolerating room air. Pt states does not wear BIPAP/CPAP support QHS. No active distress noted. Will continue to monitor

## 2022-11-22 NOTE — Progress Notes (Signed)
This RN walked by patient's room, pt had removed CPAP mask despite previously trialed interventions.  Pt agitated and stating "I need to leave".  Pt confused at this time.  Replaced CPAP mask and placed order for tele sitter.  Will update primary RN.  Rubin Payor Charge RN aware.

## 2022-11-22 NOTE — Care Management Important Message (Signed)
Important Message  Patient Details  Name: [name] [name] MRN: [id number] Date of Birth: 18-Aug-1945   Medicare Important Message Given:  Yes     Dorena Bodo 11/22/2022, 3:03 PM

## 2022-11-22 NOTE — Progress Notes (Signed)
Patient found agitated and yelling out, he removed mitts and cpap.   Patient states he wants to leave.  [facility] informed.

## 2022-11-22 NOTE — Progress Notes (Signed)
Patient was on BIPAP and at around 11am took him out of BIPAP as ordered and kept in venturi mask @4lt  O2, Spo2 maintaining at 91% t0 93%

## 2022-11-22 NOTE — Progress Notes (Signed)
Patient calm and cooperative. Mitts applied for safety.

## 2022-11-22 NOTE — Progress Notes (Signed)
[name] [name]  WUJ:[id number] DOB: [birth year] DOA: 11/18/2022 PCP: Corwin Levins, MD    Brief Narrative:  77 year old with a history of chronic atrial fibrillation on Eliquis, chronic diastolic CHF, severe pulmonary hypertension with cor pulmonale, chronic hypoxic respiratory failure requiring 3-5 L continuous O2 support, HTN, HLD, and OSA who sustained a mechanical fall when he missed a step and fell onto his back and reported to the ER 11/18/2022 with resultant severe back pain.  In the ER CT spine noted T12/L1 level widening of the anterior disc space and mildly displaced fracture of the posterior superior endplate of L1.  The patient was seen by neurosurgery who felt the fracture was unstable and required surgical stabilization.  The patient was admitted to the acute units.  Consultants:  Neurosurgery  Goals of Care:  Code Status: Full Code   DVT prophylaxis: SCDs  Interim Hx: The patient had an episode of confusion and desaturation last night when he was found without his CPAP on.  This proved to be a recurrent issue during with confusion but the patient stabilized with appropriate support.  Afebrile.  Vital signs stable.  Reports some shortness of breath at the time my visit, but is not an extremis.  Requiring Venturi to maintain saturations at 90% with mouth breathing.  Assessment & Plan:  T12-L1 endplate spine fracture secondary to mechanical fall Care as per Neurosurgery - plan is to go to the OR 11/23/2022  Chronic atrial fibrillation on Eliquis Anticoagulation on hold -patient reports allergy to heparin products - SCDs for DVT prophy for now -rate well-controlled  Chronic diastolic CHF -severe pulmonary hypertension with cor pulmonale Increase diuretic today -titrate oxygen as necessary  OHS/Sleep apnea Continue usual home CPAP regimen  Chronic hypoxic respiratory failure Continue usual supportive oxygen at baseline of 4-6LPM as confirmed today by daughter -slightly worse today  likely related to lack of sleep last night and lack of CPAP support overnight - utilizing facemask as needed as patient often mouth breathes  DM2 On oral therapy as outpatient -follow with SSI during this admission -CBG stable presently  Gout Continue usual allopurinol dose  Obesity - Body mass index is 41.81 kg/m.  Family Communication: Spoke with daughter at bedside Disposition: Will depend upon performance with physical therapy postoperatively   Objective: Blood pressure 139/89, pulse 82, temperature 98.3 F (36.8 C), resp. rate 18, height  (1.727 m), weight 124.7 kg, SpO2 97 %.  Intake/Output Summary (Last 24 hours) at 11/22/2022 0951 Last data filed at 11/21/2022 2333 Gross per 24 hour  Intake --  Output 600 ml  Net -600 ml    Filed Weights   11/18/22 1104  Weight: 124.7 kg    Examination: General: No acute respiratory distress Lungs: Mild bibasilar crackles without wheezing with air movement appreciable bilaterally Cardiovascular: Regular rate and rhythm without murmur  Abdomen: Nontender, nondistended, soft, bowel sounds positive, no rebound Extremities: No significant edema bilateral LE  CBC: Recent Labs  Lab 11/18/22 1228 11/20/22 0325  WBC 15.4* 12.9*  NEUTROABS 11.6*  --   HGB 13.8 13.2  HCT 43.7 41.6  MCV 91.0 90.8  PLT 227 205    Basic Metabolic Panel: Recent Labs  Lab 11/20/22 0325 11/21/22 0310 11/22/22 0342  NA 139 137 142  K 3.6 4.7 4.0  CL 99 96* 100  CO2 33* 32 31  GLUCOSE 113* 123* 148*  BUN 17 23 28*  CREATININE 1.34* 1.38* 1.28*  CALCIUM 8.7* 8.8* 9.8  MG 2.0  --   --  GFR: Estimated Creatinine Clearance: 63.1 mL/min (A) (by C-G formula based on SCr of 1.28 mg/dL (H)).   Scheduled Meds:  allopurinol  100 mg Oral Daily   atorvastatin  20 mg Oral Daily   Chlorhexidine Gluconate Cloth  6 each Topical Once   And   [START ON 11/23/2022] Chlorhexidine Gluconate Cloth  6 each Topical Once   diltiazem  180 mg Oral Daily    insulin aspart  0-15 Units Subcutaneous TID WC   insulin aspart  0-5 Units Subcutaneous QHS   metoprolol tartrate  25 mg Oral BID   senna-docusate  1 tablet Oral BID   torsemide  80 mg Oral Once per day on Mon Wed Fri     LOS: 4 days   [name], MD [facility] Office  [phone] Pager - Text Page per Loretha Stapler  If 7PM-7AM, please contact night-coverage per Amion 11/22/2022, 9:51 AM

## 2022-11-22 NOTE — Progress Notes (Addendum)
Patient called out, yelling. RN at bedside to assist with repositioning.   Refusing cpap,  cyanosis, agitated, confused.   A non-re breather was applied. He is now 67 with 15 L, non-rebreather.   LS, clear diminished. A/o X3.   RT called.

## 2022-11-22 NOTE — Plan of Care (Signed)
  Problem: Coping: Goal: Ability to adjust to condition or change in health will improve Outcome: Progressing   Problem: Fluid Volume: Goal: Ability to maintain a balanced intake and output will improve 11/22/2022 1740 by Tharon Aquas, RN Outcome: Progressing 11/22/2022 1740 by Tharon Aquas, RN Outcome: Progressing   Problem: Metabolic: Goal: Ability to maintain appropriate glucose levels will improve 11/22/2022 1740 by Tharon Aquas, RN Outcome: Progressing 11/22/2022 1740 by Tharon Aquas, RN Outcome: Progressing   Problem: Nutritional: Goal: Maintenance of adequate nutrition will improve 11/22/2022 1740 by Tharon Aquas, RN Outcome: Progressing 11/22/2022 1740 by Tharon Aquas, RN Outcome: Progressing   Problem: Skin Integrity: Goal: Risk for impaired skin integrity will decrease 11/22/2022 1740 by Tharon Aquas, RN Outcome: Progressing 11/22/2022 1740 by Tharon Aquas, RN Outcome: Progressing   Problem: Tissue Perfusion: Goal: Adequacy of tissue perfusion will improve Outcome: Progressing   Problem: Clinical Measurements: Goal: Cardiovascular complication will be avoided 11/22/2022 1740 by Tharon Aquas, RN Outcome: Progressing 11/22/2022 1740 by Tharon Aquas, RN Outcome: Progressing

## 2022-11-22 NOTE — Progress Notes (Signed)
Pt seemed slightly confused , upon entering room pt's venturi mask was off laying by pt. When asked pt orientation questions, pt knew his name , date of birth and that he was in the hospital . When asked what year it was pt states that it's 1977. Venturi mask reapplied with sats 94%. Provider Rathore notified. Will continue to monitor , call bell within reach, bed in lowest position , bed alarm set.

## 2022-11-22 NOTE — TOC Progression Note (Signed)
Transition of Care [facility]) - Progression Note    Patient Details  Name: [name] [name] MRN: [id number] Date of Birth: [birth year]  Transition of Care [facility]) CM/SW Contact  Kermit Balo, RN Phone Number: 11/22/2022, 3:05 PM  Clinical Narrative:    Plan is for OR tomorrow and then therapy evals.  TOC following.   Expected Discharge Plan:  (to be determined) Barriers to Discharge: Continued Medical Work up  Expected Discharge Plan and Services   Discharge Planning Services: CM Consult   Living arrangements for the past 2 months: Single Family Home                                       Social Determinants of Health (SDOH) Interventions SDOH Screenings   Depression (PHQ2-9): Low Risk  (10/08/2022)  Tobacco Use: Medium Risk (11/18/2022)    Readmission Risk Interventions     No data to display

## 2022-11-23 ENCOUNTER — Other Ambulatory Visit: Payer: Self-pay

## 2022-11-23 ENCOUNTER — Inpatient Hospital Stay (HOSPITAL_COMMUNITY): Payer: Medicare Other

## 2022-11-23 ENCOUNTER — Encounter (HOSPITAL_COMMUNITY): Payer: Self-pay | Admitting: Internal Medicine

## 2022-11-23 ENCOUNTER — Encounter (HOSPITAL_COMMUNITY)
Admission: EM | Disposition: A | Discharge status: Skilled Nursing Facility | Payer: Self-pay | Source: Home / Self Care | Attending: Internal Medicine

## 2022-11-23 DIAGNOSIS — Z87891 Personal history of nicotine dependence: Secondary | ICD-10-CM

## 2022-11-23 DIAGNOSIS — S32019A Unspecified fracture of first lumbar vertebra, initial encounter for closed fracture: Secondary | ICD-10-CM | POA: Diagnosis not present

## 2022-11-23 DIAGNOSIS — S22089A Unspecified fracture of T11-T12 vertebra, initial encounter for closed fracture: Secondary | ICD-10-CM

## 2022-11-23 DIAGNOSIS — I4891 Unspecified atrial fibrillation: Secondary | ICD-10-CM | POA: Diagnosis not present

## 2022-11-23 DIAGNOSIS — E119 Type 2 diabetes mellitus without complications: Secondary | ICD-10-CM

## 2022-11-23 DIAGNOSIS — Z7984 Long term (current) use of oral hypoglycemic drugs: Secondary | ICD-10-CM

## 2022-11-23 DIAGNOSIS — S32018G Other fracture of first lumbar vertebra, subsequent encounter for fracture with delayed healing: Secondary | ICD-10-CM | POA: Diagnosis not present

## 2022-11-23 DIAGNOSIS — I10 Essential (primary) hypertension: Secondary | ICD-10-CM

## 2022-11-23 HISTORY — PX: APPLICATION OF ROBOTIC ASSISTANCE FOR SPINAL PROCEDURE: SHX6753

## 2022-11-23 LAB — COMPREHENSIVE METABOLIC PANEL
ALT: 19 U/L (ref 0–44)
AST: 35 U/L (ref 15–41)
Albumin: 2.3 g/dL — ABNORMAL LOW (ref 3.5–5.0)
Alkaline Phosphatase: 49 U/L (ref 38–126)
Anion gap: 13 (ref 5–15)
BUN: 33 mg/dL — ABNORMAL HIGH (ref 8–23)
CO2: 37 mmol/L — ABNORMAL HIGH (ref 22–32)
Calcium: 9.4 mg/dL (ref 8.9–10.3)
Chloride: 98 mmol/L (ref 98–111)
Creatinine, Ser: 1.3 mg/dL — ABNORMAL HIGH (ref 0.61–1.24)
GFR, Estimated: 57 mL/min — ABNORMAL LOW (ref >60)
Glucose, Bld: 145 mg/dL — ABNORMAL HIGH (ref 70–99)
Potassium: 3.3 mmol/L — ABNORMAL LOW (ref 3.5–5.1)
Sodium: 148 mmol/L — ABNORMAL HIGH (ref 135–145)
Total Bilirubin: 2 mg/dL — ABNORMAL HIGH (ref 0.3–1.2)
Total Protein: 6.4 g/dL — ABNORMAL LOW (ref 6.5–8.1)

## 2022-11-23 LAB — CBC
HCT: 43.3 % (ref 39.0–52.0)
Hemoglobin: 13.8 g/dL (ref 13.0–17.0)
MCH: 28.5 pg (ref 26.0–34.0)
MCHC: 31.9 g/dL (ref 30.0–36.0)
MCV: 89.5 fL (ref 80.0–100.0)
Platelets: 211 10*3/uL (ref 150–400)
RBC: 4.84 MIL/uL (ref 4.22–5.81)
RDW: 15.8 % — ABNORMAL HIGH (ref 11.5–15.5)
WBC: 10.8 10*3/uL — ABNORMAL HIGH (ref 4.0–10.5)
nRBC: 0 % (ref 0.0–0.2)

## 2022-11-23 LAB — SURGICAL PCR SCREEN
MRSA, PCR: NEGATIVE
Staphylococcus aureus: NEGATIVE

## 2022-11-23 LAB — GLUCOSE, CAPILLARY
Glucose-Capillary: 151 mg/dL — ABNORMAL HIGH (ref 70–99)
Glucose-Capillary: 128 mg/dL — ABNORMAL HIGH (ref 70–99)
Glucose-Capillary: 141 mg/dL — ABNORMAL HIGH (ref 70–99)
Glucose-Capillary: 190 mg/dL — ABNORMAL HIGH (ref 70–99)

## 2022-11-23 LAB — MAGNESIUM: Magnesium: 2.1 mg/dL (ref 1.7–2.4)

## 2022-11-23 SURGERY — POSTERIOR LUMBAR FUSION 3 LEVEL
Anesthesia: General

## 2022-11-23 MED ORDER — ONDANSETRON HCL 4 MG/2ML IJ SOLN
INTRAMUSCULAR | Status: AC
  Filled 2022-11-23: qty 2

## 2022-11-23 MED ORDER — FENTANYL CITRATE (PF) 250 MCG/5ML IJ SOLN
INTRAMUSCULAR | Status: AC
  Filled 2022-11-23: qty 5

## 2022-11-23 MED ORDER — ROCURONIUM BROMIDE 10 MG/ML (PF) SYRINGE
PREFILLED_SYRINGE | INTRAVENOUS | Status: DC | PRN
  Administered 2022-11-23: 70 mg via INTRAVENOUS
  Administered 2022-11-23: 20 mg via INTRAVENOUS
  Administered 2022-11-23: 10 mg via INTRAVENOUS
  Administered 2022-11-23: 40 mg via INTRAVENOUS

## 2022-11-23 MED ORDER — SUGAMMADEX SODIUM 200 MG/2ML IV SOLN
INTRAVENOUS | Status: DC | PRN
  Administered 2022-11-23: 300 mg via INTRAVENOUS

## 2022-11-23 MED ORDER — LIDOCAINE-EPINEPHRINE 1 %-1:100000 IJ SOLN
INTRAMUSCULAR | Status: AC
  Filled 2022-11-23: qty 1

## 2022-11-23 MED ORDER — MIDAZOLAM HCL 2 MG/2ML IJ SOLN: 0.5000 mg | Freq: Once | INTRAMUSCULAR | Status: DC | PRN

## 2022-11-23 MED ORDER — CHLORHEXIDINE GLUCONATE CLOTH 2 % EX PADS
6.0000 | MEDICATED_PAD | Freq: Every morning | CUTANEOUS | Status: DC
  Administered 2022-11-24: 6 via TOPICAL

## 2022-11-23 MED ORDER — BACITRACIN ZINC 500 UNIT/GM EX OINT
TOPICAL_OINTMENT | CUTANEOUS | Status: DC | PRN
  Administered 2022-11-23: 1 via TOPICAL

## 2022-11-23 MED ORDER — BACITRACIN ZINC 500 UNIT/GM EX OINT
TOPICAL_OINTMENT | CUTANEOUS | Status: AC
  Filled 2022-11-23: qty 28.35

## 2022-11-23 MED ORDER — FENTANYL CITRATE (PF) 250 MCG/5ML IJ SOLN
INTRAMUSCULAR | Status: DC | PRN
  Administered 2022-11-23: 100 ug via INTRAVENOUS
  Administered 2022-11-23: 25 ug via INTRAVENOUS

## 2022-11-23 MED ORDER — ESMOLOL HCL 100 MG/10ML IV SOLN
INTRAVENOUS | Status: DC | PRN
  Administered 2022-11-23: 20 mg via INTRAVENOUS
  Administered 2022-11-23 (×2): 10 mg via INTRAVENOUS

## 2022-11-23 MED ORDER — DEXAMETHASONE SODIUM PHOSPHATE 10 MG/ML IJ SOLN
INTRAMUSCULAR | Status: AC
  Filled 2022-11-23: qty 1

## 2022-11-23 MED ORDER — ROCURONIUM BROMIDE 10 MG/ML (PF) SYRINGE
PREFILLED_SYRINGE | INTRAVENOUS | Status: AC
  Filled 2022-11-23: qty 10

## 2022-11-23 MED ORDER — LACTATED RINGERS IV SOLN: INTRAVENOUS | Status: DC | PRN

## 2022-11-23 MED ORDER — METOPROLOL TARTRATE 5 MG/5ML IV SOLN
10.0000 mg | Freq: Once | INTRAVENOUS | Status: AC
  Administered 2022-11-23: 10 mg via INTRAVENOUS
  Filled 2022-11-23: qty 10

## 2022-11-23 MED ORDER — THROMBIN 5000 UNITS EX SOLR
CUTANEOUS | Status: AC
  Filled 2022-11-23: qty 5000

## 2022-11-23 MED ORDER — DEXAMETHASONE SODIUM PHOSPHATE 10 MG/ML IJ SOLN
INTRAMUSCULAR | Status: DC | PRN
  Administered 2022-11-23: 5 mg via INTRAVENOUS

## 2022-11-23 MED ORDER — LIDOCAINE-EPINEPHRINE 1 %-1:100000 IJ SOLN
INTRAMUSCULAR | Status: DC | PRN
  Administered 2022-11-23: 10 mL

## 2022-11-23 MED ORDER — OXYCODONE HCL 5 MG/5ML PO SOLN: 5.0000 mg | Freq: Once | ORAL | Status: DC | PRN

## 2022-11-23 MED ORDER — 0.9 % SODIUM CHLORIDE (POUR BTL) OPTIME
TOPICAL | Status: DC | PRN
  Administered 2022-11-23: 1000 mL

## 2022-11-23 MED ORDER — ONDANSETRON HCL 4 MG/2ML IJ SOLN
INTRAMUSCULAR | Status: DC | PRN
  Administered 2022-11-23: 4 mg via INTRAVENOUS

## 2022-11-23 MED ORDER — HYDROMORPHONE HCL 1 MG/ML IJ SOLN
0.2500 mg | INTRAMUSCULAR | Status: DC | PRN
  Administered 2022-11-23 (×2): 0.5 mg via INTRAVENOUS

## 2022-11-23 MED ORDER — POTASSIUM CHLORIDE 10 MEQ/100ML IV SOLN
10.0000 meq | INTRAVENOUS | Status: AC
  Administered 2022-11-23 (×3): 10 meq via INTRAVENOUS
  Filled 2022-11-23 (×3): qty 100

## 2022-11-23 MED ORDER — HYDROMORPHONE HCL 1 MG/ML IJ SOLN
INTRAMUSCULAR | Status: AC
  Filled 2022-11-23: qty 1

## 2022-11-23 MED ORDER — INSULIN ASPART 100 UNIT/ML IJ SOLN: 0.0000 [IU] | INTRAMUSCULAR | Status: DC | PRN

## 2022-11-23 MED ORDER — BUPIVACAINE HCL (PF) 0.5 % IJ SOLN
INTRAMUSCULAR | Status: AC
  Filled 2022-11-23: qty 30

## 2022-11-23 MED ORDER — CHLORHEXIDINE GLUCONATE 0.12 % MT SOLN
15.0000 mL | Freq: Once | OROMUCOSAL | Status: AC
  Administered 2022-11-23: 15 mL via OROMUCOSAL
  Filled 2022-11-23: qty 15

## 2022-11-23 MED ORDER — PHENYLEPHRINE 80 MCG/ML (10ML) SYRINGE FOR IV PUSH (FOR BLOOD PRESSURE SUPPORT)
PREFILLED_SYRINGE | INTRAVENOUS | Status: AC
  Filled 2022-11-23: qty 10

## 2022-11-23 MED ORDER — PROPOFOL 10 MG/ML IV BOLUS
INTRAVENOUS | Status: AC
  Filled 2022-11-23: qty 20

## 2022-11-23 MED ORDER — TORSEMIDE 20 MG PO TABS
80.0000 mg | ORAL_TABLET | Freq: Three times a day (TID) | ORAL | Status: DC
  Administered 2022-11-24: 80 mg via ORAL
  Filled 2022-11-23 (×3): qty 4

## 2022-11-23 MED ORDER — ORAL CARE MOUTH RINSE: 15.0000 mL | Freq: Once | OROMUCOSAL | Status: AC

## 2022-11-23 MED ORDER — BUPIVACAINE HCL (PF) 0.5 % IJ SOLN
INTRAMUSCULAR | Status: DC | PRN
  Administered 2022-11-23: 10 mL

## 2022-11-23 MED ORDER — LIDOCAINE 2% (20 MG/ML) 5 ML SYRINGE
INTRAMUSCULAR | Status: DC | PRN
  Administered 2022-11-23: 40 mg via INTRAVENOUS

## 2022-11-23 MED ORDER — LIDOCAINE 2% (20 MG/ML) 5 ML SYRINGE
INTRAMUSCULAR | Status: AC
  Filled 2022-11-23: qty 5

## 2022-11-23 MED ORDER — PROMETHAZINE HCL 25 MG/ML IJ SOLN: 6.2500 mg | INTRAMUSCULAR | Status: DC | PRN

## 2022-11-23 MED ORDER — LACTATED RINGERS IV SOLN: INTRAVENOUS | Status: DC

## 2022-11-23 MED ORDER — OXYCODONE HCL 5 MG PO TABS: 5.0000 mg | ORAL_TABLET | Freq: Once | ORAL | Status: DC | PRN

## 2022-11-23 MED ORDER — THROMBIN 5000 UNITS EX SOLR
OROMUCOSAL | Status: DC | PRN
  Administered 2022-11-23: 5 mL via TOPICAL

## 2022-11-23 MED ORDER — PROPOFOL 10 MG/ML IV BOLUS
INTRAVENOUS | Status: DC | PRN
  Administered 2022-11-23: 80 mg via INTRAVENOUS

## 2022-11-23 MED ORDER — ESMOLOL HCL 100 MG/10ML IV SOLN
INTRAVENOUS | Status: AC
  Filled 2022-11-23: qty 20

## 2022-11-23 MED ORDER — PHENYLEPHRINE 80 MCG/ML (10ML) SYRINGE FOR IV PUSH (FOR BLOOD PRESSURE SUPPORT)
PREFILLED_SYRINGE | INTRAVENOUS | Status: DC | PRN
  Administered 2022-11-23 (×3): 80 ug via INTRAVENOUS

## 2022-11-23 MED ORDER — CHLORHEXIDINE GLUCONATE CLOTH 2 % EX PADS
6.0000 | MEDICATED_PAD | Freq: Once | CUTANEOUS | Status: AC
  Administered 2022-11-23: 6 via TOPICAL

## 2022-11-23 MED ORDER — PHENYLEPHRINE HCL-NACL 20-0.9 MG/250ML-% IV SOLN
INTRAVENOUS | Status: DC | PRN
  Administered 2022-11-23: 25 ug/min via INTRAVENOUS

## 2022-11-23 MED ORDER — MEPERIDINE HCL 25 MG/ML IJ SOLN: 6.2500 mg | INTRAMUSCULAR | Status: DC | PRN

## 2022-11-23 SURGICAL SUPPLY — 85 items
ADH SKN CLS APL DERMABOND .7 (GAUZE/BANDAGES/DRESSINGS) ×1
APL SKNCLS STERI-STRIP NONHPOA (GAUZE/BANDAGES/DRESSINGS)
BAG COUNTER SPONGE SURGICOUNT (BAG) ×2 IMPLANT
BAG SPNG CNTER NS LX DISP (BAG) ×2
BASKET BONE COLLECTION (BASKET) ×1 IMPLANT
BENZOIN TINCTURE PRP APPL 2/3 (GAUZE/BANDAGES/DRESSINGS) IMPLANT
BIT DRILL LONG 3.0X30 (BIT) IMPLANT
BIT DRILL LONG 3X80 (BIT) IMPLANT
BIT DRILL LONG 4X80 (BIT) IMPLANT
BIT DRILL SHORT 3.0X30 (BIT) IMPLANT
BIT DRILL SHORT 3X80 (BIT) IMPLANT
BIT DRILL TWIST 3X30 (BIT) IMPLANT
BLADE BONE MILL MEDIUM (MISCELLANEOUS) IMPLANT
BLADE CLIPPER SURG (BLADE) IMPLANT
BLADE SURG 11 STRL SS (BLADE) ×2 IMPLANT
BUR MATCHSTICK NEURO 3.0 LAGG (BURR) ×1 IMPLANT
BUR PRECISION FLUTE 5.0 (BURR) ×1 IMPLANT
CANISTER SUCT 3000ML PPV (MISCELLANEOUS) ×1 IMPLANT
CNTNR URN SCR LID CUP LEK RST (MISCELLANEOUS) ×1 IMPLANT
CONT SPEC 4OZ STRL OR WHT (MISCELLANEOUS) ×1
COVER BACK TABLE 60X90IN (DRAPES) ×1 IMPLANT
COVERAGE SUPPORT O-ARM STEALTH (MISCELLANEOUS) IMPLANT
DERMABOND ADVANCED .7 DNX12 (GAUZE/BANDAGES/DRESSINGS) ×1 IMPLANT
DRAPE C-ARM 42X72 X-RAY (DRAPES) ×1 IMPLANT
DRAPE C-ARMOR (DRAPES) ×1 IMPLANT
DRAPE LAPAROTOMY 100X72X124 (DRAPES) ×1 IMPLANT
DRAPE SHEET LG 3/4 BI-LAMINATE (DRAPES) ×5 IMPLANT
DRAPE SURG 17X23 STRL (DRAPES) ×1 IMPLANT
DRSG OPSITE POSTOP 3X4 (GAUZE/BANDAGES/DRESSINGS) IMPLANT
DRSG OPSITE POSTOP 4X8 (GAUZE/BANDAGES/DRESSINGS) IMPLANT
DURAPREP 26ML APPLICATOR (WOUND CARE) ×1 IMPLANT
ELECT REM PT RETURN 9FT ADLT (ELECTROSURGICAL) ×1
ELECTRODE REM PT RTRN 9FT ADLT (ELECTROSURGICAL) ×1 IMPLANT
EXTENDER TAB GUIDE SV 5.5/6.0 (INSTRUMENTS) IMPLANT
FEE COVERAGE SUPPORT O-ARM (MISCELLANEOUS) ×1 IMPLANT
GAUZE 4X4 16PLY ~~LOC~~+RFID DBL (SPONGE) IMPLANT
GAUZE SPONGE 4X4 12PLY STRL (GAUZE/BANDAGES/DRESSINGS) IMPLANT
GLOVE BIO SURGEON STRL SZ7 (GLOVE) IMPLANT
GLOVE BIO SURGEON STRL SZ7.5 (GLOVE) IMPLANT
GLOVE BIOGEL PI IND STRL 7.0 (GLOVE) IMPLANT
GLOVE BIOGEL PI IND STRL 7.5 (GLOVE) ×2 IMPLANT
GLOVE ECLIPSE 7.0 STRL STRAW (GLOVE) ×2 IMPLANT
GLOVE EXAM NITRILE XL STR (GLOVE) IMPLANT
GOWN STRL REUS W/ TWL LRG LVL3 (GOWN DISPOSABLE) ×2 IMPLANT
GOWN STRL REUS W/ TWL XL LVL3 (GOWN DISPOSABLE) IMPLANT
GOWN STRL REUS W/TWL 2XL LVL3 (GOWN DISPOSABLE) IMPLANT
GOWN STRL REUS W/TWL LRG LVL3 (GOWN DISPOSABLE) ×2
GOWN STRL REUS W/TWL XL LVL3 (GOWN DISPOSABLE)
HEMOSTAT POWDER KIT SURGIFOAM (HEMOSTASIS) ×1 IMPLANT
KIT BASIN OR (CUSTOM PROCEDURE TRAY) ×1 IMPLANT
KIT SPINE MAZOR X ROBO DISP (MISCELLANEOUS) ×1 IMPLANT
KIT TURNOVER KIT B (KITS) ×1 IMPLANT
MARKER SPHERE PSV REFLC NDI (MISCELLANEOUS) ×5 IMPLANT
MILL BONE PREP (MISCELLANEOUS) ×1 IMPLANT
NDL HYPO 18GX1.5 BLUNT FILL (NEEDLE) IMPLANT
NDL SPNL 18GX3.5 QUINCKE PK (NEEDLE) IMPLANT
NEEDLE HYPO 18GX1.5 BLUNT FILL (NEEDLE) IMPLANT
NEEDLE HYPO 22GX1.5 SAFETY (NEEDLE) ×1 IMPLANT
NEEDLE SPNL 18GX3.5 QUINCKE PK (NEEDLE) IMPLANT
NS IRRIG 1000ML POUR BTL (IV SOLUTION) ×1 IMPLANT
PACK LAMINECTOMY NEURO (CUSTOM PROCEDURE TRAY) ×1 IMPLANT
PAD ARMBOARD 7.5X6 YLW CONV (MISCELLANEOUS) ×3 IMPLANT
PIN HEAD 2.5X60MM (PIN) IMPLANT
ROD SPINAL STRT HORIZ 5.5X120 (Rod) IMPLANT
SCREW MAS VOYAGER 4.5X45 (Screw) IMPLANT
SCREW MAS VOYAGER 5.5X45 (Screw) IMPLANT
SCREW SCHANZ SA 4.0MM (MISCELLANEOUS) IMPLANT
SCREW SET 5.5/6.0MM SOLERA (Screw) IMPLANT
SOL ELECTROSURG ANTI STICK (MISCELLANEOUS) ×1
SOLUTION ELECTROSURG ANTI STCK (MISCELLANEOUS) ×3 IMPLANT
SPIKE FLUID TRANSFER (MISCELLANEOUS) ×1 IMPLANT
SPONGE SURGIFOAM ABS GEL 100 (HEMOSTASIS) IMPLANT
SPONGE T-LAP 4X18 ~~LOC~~+RFID (SPONGE) IMPLANT
STAPLER VISISTAT 35W (STAPLE) IMPLANT
STRIP CLOSURE SKIN 1/2X4 (GAUZE/BANDAGES/DRESSINGS) IMPLANT
SUT VIC AB 0 CT1 18XCR BRD8 (SUTURE) ×2 IMPLANT
SUT VIC AB 0 CT1 8-18 (SUTURE) ×2
SUT VIC AB 3-0 SH 8-18 (SUTURE) IMPLANT
SUT VICRYL 3-0 RB1 18 ABS (SUTURE) ×1 IMPLANT
SYR 3ML LL SCALE MARK (SYRINGE) ×3 IMPLANT
TOWEL GREEN STERILE (TOWEL DISPOSABLE) ×1 IMPLANT
TOWEL GREEN STERILE FF (TOWEL DISPOSABLE) ×1 IMPLANT
TRAY FOLEY MTR SLVR 16FR STAT (SET/KITS/TRAYS/PACK) ×1 IMPLANT
TUBE MAZOR SA REDUCTION (TUBING) ×1 IMPLANT
WATER STERILE IRR 1000ML POUR (IV SOLUTION) ×1 IMPLANT

## 2022-11-23 NOTE — Transfer of Care (Signed)
Immediate Anesthesia Transfer of Care Note  Patient: [name] [name]  Procedure(s) Performed: THORACIC ELEVEN TO LUMBAR TWO OPEN REDUCTION INTERNAL FIXATION Application of O-Arm APPLICATION OF ROBOTIC ASSISTANCE FOR SPINAL PROCEDURE  Patient Location: PACU  Anesthesia Type:General  Level of Consciousness: drowsy and pateint uncooperative  Airway & Oxygen Therapy:  Bipap  Post-op Assessment: Report given to RN and Post -op Vital signs reviewed and stable  Post vital signs: Reviewed and stable  Last Vitals:  Vitals Value Taken Time  BP 157/87 11/23/22 1916  Temp    Pulse 108 11/23/22 1921  Resp 15 11/23/22 1921  SpO2 93 % 11/23/22 1921  Vitals shown include unvalidated device data.  Last Pain:  Vitals:   11/23/22 1408  TempSrc: Axillary  PainSc: 4       Patients Stated Pain Goal: 4 (11/23/22 0900)  Complications: No notable events documented.

## 2022-11-23 NOTE — Progress Notes (Signed)
Patient taken to OR for ORIF

## 2022-11-23 NOTE — Progress Notes (Signed)
Patient out from BIPAP, kept in Venturi  O2, spo2 maintained at 93-95%

## 2022-11-23 NOTE — Progress Notes (Signed)
[name] [name]  [id number] DOB: May 18, 1946 DOA: 11/18/2022 PCP: Corwin Levins, MD    Brief Narrative:  77 year old with a history of chronic atrial fibrillation on Eliquis, chronic diastolic CHF, severe pulmonary hypertension with cor pulmonale, chronic hypoxic respiratory failure requiring 3-5 L continuous O2 support, HTN, HLD, and OSA who sustained a mechanical fall when he missed a step and fell onto his back and reported to the ER 11/18/2022 with resultant severe back pain.  In the ER CT spine noted T12/L1 level widening of the anterior disc space and mildly displaced fracture of the posterior superior endplate of L1.  The patient was seen by neurosurgery who felt the fracture was unstable and required surgical stabilization.  The patient was admitted to the acute units.  Consultants:  Neurosurgery  Goals of Care:  Code Status: Full Code   DVT prophylaxis: SCDs  Interim Hx: Refused consistent use of BiPAP last night.  Stable on Venturi mask today, being utilized due to the fact that he is frequently a mouth breather.  No acute distress.  Afebrile.  Vital signs stable.  No new complaints.  Reports ongoing poorly controlled back pain.  No chest pain or shortness of breath at time of visit.  Assessment & Plan:  T12-L1 endplate spine fracture secondary to mechanical fall Care as per Neurosurgery - to OR today  Chronic atrial fibrillation on Eliquis Anticoagulation on hold -patient reports allergy to heparin products - SCDs for DVT prophy for now - rate well-controlled  Chronic diastolic CHF -severe pulmonary hypertension with cor pulmonale Volume status improved with daily diuretic and stable at present  Hypokalemia Due to diuresis -supplement and follow -magnesium is normal  OHS/Sleep apnea Continue usual home CPAP regimen  Chronic hypoxic respiratory failure Continue usual supportive oxygen at baseline of 4-6LPM as confirmed by daughter - utilizing Venturi facemask as needed as  patient often mouth breathes  DM2 On oral therapy as outpatient -follow with SSI during this admission -CBG stable presently  Gout Continue usual allopurinol dose  Obesity - Body mass index is 41.81 kg/m.  Family Communication: Spoke with daughter at bedside Disposition: Will depend upon performance with physical therapy postoperatively   Objective: Blood pressure 122/66, pulse 88, temperature 98 F (36.7 C), temperature source Axillary, resp. rate 20, height  (1.727 m), weight 124.7 kg, SpO2 97 %.  Intake/Output Summary (Last 24 hours) at 11/23/2022 0952 Last data filed at 11/22/2022 2000 Gross per 24 hour  Intake --  Output 2750 ml  Net -2750 ml    Filed Weights   11/18/22 1104  Weight: 124.7 kg    Examination: General: No acute respiratory distress on Venturi facemask Lungs: Mild bibasilar crackles without wheezing with air movement appreciable bilaterally -no crackles Cardiovascular: Regular rate and rhythm without murmur  Abdomen: Nontender, nondistended, soft, bowel sounds positive, no rebound Extremities: No significant edema bilateral LE  CBC: Recent Labs  Lab 11/18/22 1228 11/20/22 0325 11/23/22 0416  WBC 15.4* 12.9* 10.8*  NEUTROABS 11.6*  --   --   HGB 13.8 13.2 13.8  HCT 43.7 41.6 43.3  MCV 91.0 90.8 89.5  PLT 227 205 211    Basic Metabolic Panel: Recent Labs  Lab 11/20/22 0325 11/21/22 0310 11/22/22 0342 11/23/22 0604  NA 139 137 142 148*  K 3.6 4.7 4.0 3.3*  CL 99 96* 100 98  CO2 33* 32 31 37*  GLUCOSE 113* 123* 148* 145*  BUN 17 23 28* 33*  CREATININE 1.34* 1.38* 1.28*  1.30*  CALCIUM 8.7* 8.8* 9.8 9.4  MG 2.0  --   --  2.1    GFR: Estimated Creatinine Clearance: 62.2 mL/min (A) (by C-G formula based on SCr of 1.3 mg/dL (H)).   Scheduled Meds:  allopurinol  100 mg Oral Daily   atorvastatin  20 mg Oral Daily   diltiazem  180 mg Oral Daily   insulin aspart  0-15 Units Subcutaneous TID WC   insulin aspart  0-5 Units  Subcutaneous QHS   metoprolol tartrate  25 mg Oral BID   senna-docusate  1 tablet Oral BID   torsemide  80 mg Oral TID     LOS: 5 days   [name], MD [facility] Office  [phone] Pager - Text Page per Loretha Stapler  If 7PM-7AM, please contact night-coverage per Amion 11/23/2022, 9:52 AM

## 2022-11-23 NOTE — Progress Notes (Signed)
  NEUROSURGERY PROGRESS NOTE   No issues overnight.   EXAM:  BP 122/66 (BP Location: Right Arm)   Pulse 88   Temp 98 F (36.7 C) (Axillary)   Resp 20   Ht  (1.727 m)   Wt 124.7 kg   SpO2 97%   BMI 41.81 kg/m   Awake, alert, oriented  Speech fluent, appropriate  CN grossly intact  5/5 BUE/BLE   IMPRESSION:  77 y.o. male s/p fall with T12-L1 unstable fracture, remains neurologically intact on bedrest.  PLAN: - Plan on ORIF of fracture today - Cont supportive care - Would plan on return to 3W postop  I have again reviewed the surgery, risks, benefits, alternatives with patient and wife at bedside. We also discussed expected postop course/recovery. All questions were answered today.   Lisbeth Renshaw, MD [facility]

## 2022-11-23 NOTE — Anesthesia Procedure Notes (Signed)
Procedure Name: Intubation Date/Time: 11/23/2022 4:18 PM  Performed by: Cy Blamer, CRNAPre-anesthesia Checklist: Patient identified, Emergency Drugs available, Suction available and Patient being monitored Patient Re-evaluated:Patient Re-evaluated prior to induction Oxygen Delivery Method: Circle system utilized Preoxygenation: Pre-oxygenation with 100% oxygen Induction Type: IV induction Ventilation: Two handed mask ventilation required Laryngoscope Size: Miller and 2 Grade View: Grade II Tube type: Oral Tube size: 7.5 mm Number of attempts: 1 Airway Equipment and Method: Stylet Placement Confirmation: ETT inserted through vocal cords under direct vision, positive ETCO2 and breath sounds checked- equal and bilateral Secured at: 23 cm Tube secured with: Tape Dental Injury: Teeth and Oropharynx as per pre-operative assessment  Comments: 2 hand mask for beard

## 2022-11-23 NOTE — Anesthesia Postprocedure Evaluation (Signed)
Anesthesia Post Note  Patient: Rufus MANU RUBEY  Procedure(s) Performed: THORACIC ELEVEN TO LUMBAR TWO OPEN REDUCTION INTERNAL FIXATION Application of O-Arm APPLICATION OF ROBOTIC ASSISTANCE FOR SPINAL PROCEDURE     Patient location during evaluation: PACU Anesthesia Type: General Level of consciousness: awake and alert Pain management: pain level controlled Vital Signs Assessment: post-procedure vital signs reviewed and stable Respiratory status: spontaneous breathing, nonlabored ventilation, respiratory function stable and patient connected to nasal cannula oxygen Cardiovascular status: blood pressure returned to baseline and stable Postop Assessment: no apparent nausea or vomiting Anesthetic complications: no  No notable events documented.  Last Vitals:  Vitals:   11/23/22 2028 11/23/22 2050  BP: (!) 147/91   Pulse: (!) 137 (!) 109  Resp: 17 (!) 28  Temp: 36.8 C   SpO2: (!) 85% 93%    Last Pain:  Vitals:   11/23/22 2050  TempSrc:   PainSc: 10-Worst pain ever                 Shelton Silvas

## 2022-11-23 NOTE — Brief Op Note (Signed)
  NEUROSURGERY BRIEF OPERATIVE NOTE   PREOP DX: T12-L1 Fracture  POSTOP DX: Same  PROCEDURE: ORIF fracture, T11-L2 instrumented stabilization  SURGEON: Dr. Lisbeth Renshaw, MD  ASSISTANT: None  ANESTHESIA: GETA  EBL: 200cc  SPECIMENS: None  DRAINS: None  COMPLICATIONS: None immediate  CONDITION: Hemodynamically stable to PACU, plan to extubate in PACU  FINDINGS: Good position of T11-L2 hardware   Lisbeth Renshaw, MD [facility]

## 2022-11-23 NOTE — Anesthesia Preprocedure Evaluation (Addendum)
Anesthesia Evaluation  Patient identified by MRN, date of birth, ID band Patient awake    Reviewed: Allergy & Precautions, NPO status , Patient's Chart, lab work & pertinent test results  History of Anesthesia Complications Negative for: history of anesthetic complications  Airway Mallampati: II  TM Distance: >3 FB Neck ROM: Full    Dental  (+) Dental Advisory Given   Pulmonary sleep apnea, Continuous Positive Airway Pressure Ventilation and Oxygen sleep apnea , former smoker   breath sounds clear to auscultation       Cardiovascular hypertension, Pt. on medications pulmonary hypertension(-) angina + dysrhythmias Atrial Fibrillation  Rhythm:Irregular Rate:Normal  '22 ECHO: EF 55-60%. The LV has normal function, no regional wall motion abnormalities, no significant valvular abnormalities    Neuro/Psych  Headaches    GI/Hepatic Neg liver ROS,GERD  Medicated and Controlled,,  Endo/Other  diabetes (glu 128), Oral Hypoglycemic Agents  BMI 41.8  Renal/GU Renal InsufficiencyRenal disease     Musculoskeletal   Abdominal  (+) + obese  Peds  Hematology   Anesthesia Other Findings   Reproductive/Obstetrics                              Anesthesia Physical Anesthesia Plan  ASA: 4  Anesthesia Plan: General   Post-op Pain Management: Tylenol PO (pre-op)*   Induction: Intravenous  PONV Risk Score and Plan: 2 and Ondansetron, Dexamethasone and Treatment may vary due to age or medical condition  Airway Management Planned: Oral ETT  Additional Equipment: None  Intra-op Plan:   Post-operative Plan: Possible Post-op intubation/ventilation  Informed Consent: I have reviewed the patients History and Physical, chart, labs and discussed the procedure including the risks, benefits and alternatives for the proposed anesthesia with the patient or authorized representative who has indicated his/her  understanding and acceptance.     Dental advisory given  Plan Discussed with: CRNA and Surgeon  Anesthesia Plan Comments:          Anesthesia Quick Evaluation

## 2022-11-24 DIAGNOSIS — S32018G Other fracture of first lumbar vertebra, subsequent encounter for fracture with delayed healing: Secondary | ICD-10-CM | POA: Diagnosis not present

## 2022-11-24 LAB — CBC
HCT: 44.5 % (ref 39.0–52.0)
Hemoglobin: 14 g/dL (ref 13.0–17.0)
MCH: 29 pg (ref 26.0–34.0)
MCHC: 31.5 g/dL (ref 30.0–36.0)
MCV: 92.1 fL (ref 80.0–100.0)
Platelets: 258 10*3/uL (ref 150–400)
RBC: 4.83 MIL/uL (ref 4.22–5.81)
RDW: 16 % — ABNORMAL HIGH (ref 11.5–15.5)
WBC: 13.4 10*3/uL — ABNORMAL HIGH (ref 4.0–10.5)
nRBC: 0 % (ref 0.0–0.2)

## 2022-11-24 LAB — BASIC METABOLIC PANEL
Anion gap: 14 (ref 5–15)
BUN: 44 mg/dL — ABNORMAL HIGH (ref 8–23)
CO2: 35 mmol/L — ABNORMAL HIGH (ref 22–32)
Calcium: 9.5 mg/dL (ref 8.9–10.3)
Chloride: 99 mmol/L (ref 98–111)
Creatinine, Ser: 1.48 mg/dL — ABNORMAL HIGH (ref 0.61–1.24)
GFR, Estimated: 49 mL/min — ABNORMAL LOW (ref >60)
Glucose, Bld: 198 mg/dL — ABNORMAL HIGH (ref 70–99)
Potassium: 4 mmol/L (ref 3.5–5.1)
Sodium: 148 mmol/L — ABNORMAL HIGH (ref 135–145)

## 2022-11-24 LAB — GLUCOSE, CAPILLARY
Glucose-Capillary: 191 mg/dL — ABNORMAL HIGH (ref 70–99)
Glucose-Capillary: 173 mg/dL — ABNORMAL HIGH (ref 70–99)
Glucose-Capillary: 155 mg/dL — ABNORMAL HIGH (ref 70–99)
Glucose-Capillary: 156 mg/dL — ABNORMAL HIGH (ref 70–99)

## 2022-11-24 MED ORDER — POLYETHYLENE GLYCOL 3350 17 G PO PACK
17.0000 g | PACK | Freq: Every day | ORAL | Status: DC
  Administered 2022-11-24 – 2022-11-25 (×2): 17 g via ORAL
  Filled 2022-11-24 (×2): qty 1

## 2022-11-24 MED ORDER — PHENOL 1.4 % MT LIQD: 1.0000 | OROMUCOSAL | Status: DC | PRN

## 2022-11-24 MED ORDER — TORSEMIDE 20 MG PO TABS: 60.0000 mg | ORAL_TABLET | Freq: Two times a day (BID) | ORAL | Status: DC

## 2022-11-24 MED ORDER — TORSEMIDE 20 MG PO TABS: 60.0000 mg | ORAL_TABLET | Freq: Every day | ORAL | Status: DC

## 2022-11-24 NOTE — Progress Notes (Signed)
PROGRESS NOTE  [name] [name]  DOB: Sep 03, 1945  PCP: Corwin Levins, MD ZOX:[id number]  DOA: 11/18/2022  LOS: 6 days  Hospital Day: 7  Brief narrative: [name] [name] is a 78 y.o. male with PMH significant for morbid obesity, OSA, OHS, DM2, HTN, HLD, COPD, severe pulmonary hypertension, chronic diastolic and right-sided CHF, chronic 3 to 5 L oxygen requirement, permanent A-fib on Eliquis, migraine, arthritis, gout. 4/11, patient presented to the ED after a mechanical fall when he missed a step and fell onto his back with resultant severe back pain.  In the ED, CT spine noted T12/L1 level widening of the anterior disc space and mildly displaced fracture of the posterior superior endplate of L1. Admitted to [facility] Neurosurgery recommended surgical stabilization 4/16, patient underwent ORIF of the fracture, T11-L2 instrumented stabilization.  Subjective: Patient was seen and examined this morning.  Propped up in bed. On 4 L oxygen by nasal cannula.  Family at bedside. Family is concerned about patient's depressed look. Patient states sore throat after extubation yesterday.  Speech therapy evaluation obtained In the last 24 hours, afebrile, heart rate in 80s, blood pressure in 140s, On nightly BiPAP.  3 L oxygen during the day Last set of blood work from this morning with sodium elevated 148, BUN/creatinine 44/1.48, WBC count elevated 13.4  Assessment and plan: T12-L1 endplate spine fracture d/t mechanical fall S/p ORIF and instrumentation T11-L2 Currently in pain management with Tylenol as needed, Dilaudid as needed, oxycodone as needed Currently on SCDs for DVT prophylaxis.  Reported allergy to heparin products Per neurosurgery, okay to resume Eliquis on 4/19 PT/OT eval ordered Remove Foley catheter today.  Chronic atrial fibrillation  PTA on Cardizem 180 mg daily, metoprolol 25 mg twice daily Currently continued on both. Anticoagulation plan as above.     Chronic diastolic CHF Severe  pulmonary hypertension with cor pulmonale PTA on metoprolol 25 mg twice daily, torsemide 60-80 mg twice daily Currently continued on both but torsemide at a higher dose of 80 mg 3 times daily.  Dose reduced today because of worsening creatinine and hypernatremia Volume status improved with daily diuretic and stable at present  AKI on CKD 3a Chronic compensated respiratory acidosis Baseline creatinine less than 1.2.  Creatinine and bicarbonate level has been trending up Reduce torsemide to 60 mg twice daily. Serum bicarb level elevated at baseline to 30s  Recent Labs    02/26/22 0956 04/30/22 1101 07/22/22 0909 10/08/22 1611 11/18/22 1228 11/20/22 0325 11/21/22 0310 11/22/22 0342 11/23/22 0604 11/24/22 0243  BUN 28* 33* 44*  CREATININE 1.15 1.23 1.23 1.19 1.26* 1.34* 1.38* 1.28* 1.30* 1.48*  CO2 30* 36* 34* 34* 30 33* 32 31 37* 35*   Hypernatremia Sodium level running elevated to 148 today.  Reduce torsemide.  Continue to monitor Recent Labs  Lab 11/18/22 1228 11/20/22 0325 11/21/22 0310 11/22/22 0342 11/23/22 0604 11/24/22 0243  NA 140 139 137 142 148* 148*   Hypokalemia Potassium level improved with replacement Recent Labs  Lab 11/20/22 0325 11/21/22 0310 11/22/22 0342 11/23/22 0604 11/24/22 0243  K 3.6 4.7 4.0 3.3* 4.0  MG 2.0  --   --  2.1  --    Morbid Obesity  Body mass index is 41.81 kg/m. Patient has been advised to make an attempt to improve diet and exercise patterns to aid in weight loss.   OHS/Sleep apnea Continue usual home CPAP regimen   Chronic hypoxic respiratory failure Continue usual supportive  oxygen at baseline of 4-6LPM as confirmed by daughter - utilizing Venturi facemask as needed as patient often mouth breathes  Type 2 diabetes mellitus A1c 7 on March 2024 PTA on metformin 1000 mg daily, glipizide 10 mg daily Currently both of them are on hold.  Continue sliding scale insulin with Accu-Cheks.  Once appetite  improves, may need long-acting insulin or reinitiation of oral meds. Recent Labs  Lab 11/23/22 1303 11/23/22 1921 11/23/22 2151 11/24/22 0742 11/24/22 1137  GLUCAP 128* 141* 190* 191* 173*    HLD Lipitor  Gout Allopurinol  Constipation Continue Senokot scheduled and as needed Dulcolax.  No bowel movement charted in several days.  Give 1 dose of MiraLAX today   Mobility: PT OT eval  Goals of care   Code Status: Full Code     DVT prophylaxis: Plan to resume Eliquis on 4/19 SCDs Start: 11/18/22 1455   Antimicrobials: None Fluid: None Consultants: Neurosurgery Family Communication: Family was at bedside  Status: Inpatient Level of care:  Progressive   Needs to continue in-hospital care:  POD 1.  Pending PT eval  Patient from: Home Anticipated d/c to: Pending clinical course   Diet:  Diet Order             Diet regular Room service appropriate? Yes; Fluid consistency: Thin  Diet effective now                   Scheduled Meds:  allopurinol  100 mg Oral Daily   atorvastatin  20 mg Oral Daily   Chlorhexidine Gluconate Cloth  6 each Topical q morning   diltiazem  180 mg Oral Daily   insulin aspart  0-15 Units Subcutaneous TID WC   insulin aspart  0-5 Units Subcutaneous QHS   metoprolol tartrate  25 mg Oral BID   polyethylene glycol  17 g Oral Daily   senna-docusate  1 tablet Oral BID   [START ON 11/25/2022] torsemide  60 mg Oral Daily    PRN meds: acetaminophen, albuterol, bisacodyl, HYDROmorphone (DILAUDID) injection, oxyCODONE, phenol   Infusions:    Antimicrobials: Anti-infectives (From admission, onward)    Start     Dose/Rate Route Frequency Ordered Stop   11/23/22 0600  ceFAZolin (ANCEF) IVPB 3g/100 mL premix        3 g 200 mL/hr over 30 Minutes Intravenous On call to O.R. 11/22/22 0939 11/23/22 1650       Nutritional status:  Body mass index is 41.81 kg/m.          Objective: Vitals:   11/24/22 1200 11/24/22 1259  BP:   124/81  Pulse:  92  Resp:  20  Temp: 99 F (37.2 C) 98 F (36.7 C)  SpO2:  94%    Intake/Output Summary (Last 24 hours) at 11/24/2022 1330 Last data filed at 11/24/2022 1100 Gross per 24 hour  Intake 1600 ml  Output 1970 ml  Net -370 ml   Filed Weights   11/18/22 1104  Weight: 124.7 kg   Weight change:  Body mass index is 41.81 kg/m.   Physical Exam: General exam: Pleasant, elderly Caucasian male. Skin: No rashes, lesions or ulcers. HEENT: Atraumatic, normocephalic, no obvious bleeding Lungs: Clear to auscultation bilaterally.. CVS: Regular rate and rhythm, no murmur soft GI/Abd: Nontender, nondistended, bowel sound present CNS: Alert, awake, slow to respond but oriented Psychiatry: Sad affect Extremities: No pedal edema, no calf tenderness  Data Review: I have personally reviewed the laboratory data and studies available.  F/u  labs ordered [employer] (From admission, onward)     Start     Ordered   11/25/22 0500  CBC with Differential/Platelet  Daily,   R     Question:  Specimen collection method  Answer:  Lab=Lab collect   11/24/22 1330   11/25/22 0500  Basic metabolic panel  Daily,   R     Question:  Specimen collection method  Answer:  Lab=Lab collect   11/24/22 1330            Total time spent in review of labs and imaging, patient evaluation, formulation of plan, documentation and communication with family: 55 minutes  Signed, Lorin Glass, MD [facility] 11/24/2022

## 2022-11-24 NOTE — Progress Notes (Signed)
Orthopedic Tech Progress Note Patient Details:  Rikki AKASH WINSKI 1946/03/16 [id number]  Called in order to [employer] for a TLSO VERTALIGN   Patient ID: LEOCADIO HEAL, male   DOB: July 18, 1946, 77 y.o.   MRN: [id number]  Donald Pore 11/24/2022, 8:32 AM

## 2022-11-24 NOTE — Progress Notes (Signed)
  NEUROSURGERY PROGRESS NOTE   No issues overnight, was on BiPAP. Reports appropriate back soreness, no new N/T/W of LE.  EXAM:  BP 133/85 (BP Location: Left Arm)   Pulse 93   Temp 98 F (36.7 C) (Axillary)   Resp (!) 25   Ht  (1.727 m)   Wt 124.7 kg   SpO2 92%   BMI 41.81 kg/m   Awake, alert Speech fluent, appropriate  CN grossly intact  Good strength BLE   IMPRESSION:  77 y.o. male POD#1 T11-L2 stabilization of T12-L1 fracture, neurologically stable Multiple medical comorbidities  PLAN: - Appears stable from pulmonary standpoint s/p general anesthesia. Can transfer back to stepdown - PT/OT  - Ordered TLSO brace for OOB - Plan on restarting Eliquis on Friday, 11/26/2022   Lisbeth Renshaw, MD [facility]

## 2022-11-24 NOTE — Evaluation (Signed)
Clinical/Bedside Swallow Evaluation Patient Details  Name: [name] [name] MRN: [id number] Date of Birth: 07-Jul-1946  Today's Date: 11/24/2022 Time: SLP Start Time (ACUTE ONLY): 1135 SLP Stop Time (ACUTE ONLY): 1145 SLP Time Calculation (min) (ACUTE ONLY): 10 min  Past Medical History:  Past Medical History:  Diagnosis Date   Acute right-sided CHF (congestive heart failure)    a. 01/2013.   Arthritis    "knees and hands; thoracic area of the spine" (01/05/2013)   BRONCHITIS, CHRONIC 01/02/2009   COMMON MIGRAINE    "none since treating for high BP"   Complication of anesthesia    "aspiration pneumonia after hand OR" (01/05/2013)   DIABETES MELLITUS, TYPE II    GOUT    HYPERLIPIDEMIA    HYPERTENSION    Long term (current) use of anticoagulants    Morbid obesity    OBESITY HYPOVENTILATION SYNDROME    a. on home O2.   On home oxygen therapy    OSA (obstructive sleep apnea)    Permanent atrial fibrillation    PROSTATE SPECIFIC ANTIGEN, ELEVATED 06/09/2007   Pulmonary HTN    a. multifactorial including obstructive sleep apnea, obesity hypoventilation syndrome and possible pulmonary venous hypertension.   SKIN RASH 03/12/2010   Unspecified hearing loss    Past Surgical History:  Past Surgical History:  Procedure Laterality Date   APPENDECTOMY  1974   CARDIOVERSION  05/21/2008; ~ 06/2008   HERNIA REPAIR     INGUINAL HERNIA REPAIR Right    RIGHT HEART CATHETERIZATION N/A 01/08/2013   Procedure: RIGHT HEART CATH;  Surgeon: Vesta Mixer, MD;  Location: [facility] CATH LAB;  Service: Cardiovascular;  Laterality: N/A;   TENDON REPAIR Right 2009   "lacerated his tendon and pulley" Dr. Izora Ribas   UMBILICAL HERNIA REPAIR     HPI:  [name] [name] is 77 year old male who sustained a mechanical fall when he missed a step and fell onto his back and reported to the ER 11/18/2022 with resultant severe back pain.  Pt found to have T12-L1 fracture, no s/p surgical repair. Pt with a history of chronic atrial fibrillation  on Eliquis, chronic diastolic CHF, severe pulmonary hypertension with cor pulmonale, chronic hypoxic respiratory failure requiring 3-5 L continuous O2 support, HTN, HLD, and OSA.    Assessment / Plan / Recommendation  Clinical Impression  Pt presents with functional swallowing as assessed clinically.  Xerostomia noted on arrival, like 2/2 mouth breathing.  SLP provided oral care prior to administration of PO trials. Pt tolerated ice chips and spoon sip of water.  There was reflexive cough following initial straw sip of water.  There were no further clinical s/s of aspiration with additional trials of thin liquid including serial straw sips.  Pt tolerated puree and regular solid with no clinical s/s of aspiration and exhibited good oral clearance.  Pt was intubated only for procedure and does not have a hx of dysphagia.  CXR 4/11 without acute findings.  If pt continues to exhibit clinical s/s of aspiration with liquids, please notify SLP.    Recommend regular texture diet with thin liquids.  SLP Visit Diagnosis: Dysphagia, unspecified (R13.10)    Aspiration Risk  No limitations    Diet Recommendation Regular;Thin liquid   Liquid Administration via: Cup;Straw Medication Administration: Whole meds with liquid Compensations: Slow rate;Small sips/bites Postural Changes: Seated upright at 90 degrees    Other  Recommendations Oral Care Recommendations: Oral care BID    Recommendations for follow up therapy are one component of a multi-disciplinary  discharge planning process, led by the attending physician.  Recommendations may be updated based on patient status, additional functional criteria and insurance authorization.  Follow up Recommendations No SLP follow up      Assistance Recommended at Discharge  N/A  Functional Status Assessment Patient has had a recent decline in their functional status and demonstrates the ability to make significant improvements in function in a reasonable and  predictable amount of time.  Frequency and Duration min 1 x/week  1 week       Prognosis Prognosis for improved oropharyngeal function: Good      Swallow Study   General Date of Onset: 11/18/22 HPI: [name] [name] is 77 year old male who sustained a mechanical fall when he missed a step and fell onto his back and reported to the ER 11/18/2022 with resultant severe back pain.  Pt found to have T12-L1 fracture, no s/p surgical repair. Pt with a history of chronic atrial fibrillation on Eliquis, chronic diastolic CHF, severe pulmonary hypertension with cor pulmonale, chronic hypoxic respiratory failure requiring 3-5 L continuous O2 support, HTN, HLD, and OSA. Type of Study: Bedside Swallow Evaluation Previous Swallow Assessment: None Diet Prior to this Study: NPO Temperature Spikes Noted: No Respiratory Status: Nasal cannula History of Recent Intubation: Yes Total duration of intubation (days):  (for procedure only) Behavior/Cognition: Alert;Cooperative;Confused Oral Cavity Assessment: Dry Oral Care Completed by SLP: Yes Oral Cavity - Dentition: Adequate natural dentition Self-Feeding Abilities: Needs assist Patient Positioning: Upright in bed Baseline Vocal Quality: Normal Volitional Cough:  (Fair) Volitional Swallow: Able to elicit    Oral/Motor/Sensory Function Overall Oral Motor/Sensory Function: Mild impairment Facial ROM: Within Functional Limits Facial Symmetry: Within Functional Limits Lingual ROM: Within Functional Limits Lingual Symmetry: Within Functional Limits Lingual Strength: Within Functional Limits Velum: Within Functional Limits Mandible: Impaired (reduced strength)   Ice Chips Ice chips: Within functional limits   Thin Liquid Thin Liquid: Within functional limits Presentation: Spoon;Straw    Nectar Thick Nectar Thick Liquid: Not tested   Honey Thick Honey Thick Liquid: Not tested   Puree Puree: Within functional limits Presentation: Spoon   Solid     Solid:  Within functional limits Presentation:  (SLP fed)      Kerrie Pleasure, MA, CCC-SLP Acute Rehabilitation Services Office: [phone] 11/24/2022,11:57 AM

## 2022-11-25 ENCOUNTER — Encounter (HOSPITAL_COMMUNITY): Payer: Self-pay | Admitting: Neurosurgery

## 2022-11-25 DIAGNOSIS — S32018G Other fracture of first lumbar vertebra, subsequent encounter for fracture with delayed healing: Secondary | ICD-10-CM | POA: Diagnosis not present

## 2022-11-25 LAB — CBC WITH DIFFERENTIAL/PLATELET
Abs Immature Granulocytes: 0.08 10*3/uL — ABNORMAL HIGH (ref 0.00–0.07)
Basophils Absolute: 0 10*3/uL (ref 0.0–0.1)
Basophils Relative: 0 %
Eosinophils Absolute: 0 10*3/uL (ref 0.0–0.5)
Eosinophils Relative: 0 %
HCT: 42.8 % (ref 39.0–52.0)
Hemoglobin: 13.3 g/dL (ref 13.0–17.0)
Immature Granulocytes: 1 %
Lymphocytes Relative: 11 %
Lymphs Abs: 1.5 10*3/uL (ref 0.7–4.0)
MCH: 29.2 pg (ref 26.0–34.0)
MCHC: 31.1 g/dL (ref 30.0–36.0)
MCV: 93.9 fL (ref 80.0–100.0)
Monocytes Absolute: 1.4 10*3/uL — ABNORMAL HIGH (ref 0.1–1.0)
Monocytes Relative: 11 %
Neutro Abs: 10.5 10*3/uL — ABNORMAL HIGH (ref 1.7–7.7)
Neutrophils Relative %: 77 %
Platelets: 295 10*3/uL (ref 150–400)
RBC: 4.56 MIL/uL (ref 4.22–5.81)
RDW: 16.2 % — ABNORMAL HIGH (ref 11.5–15.5)
WBC: 13.5 10*3/uL — ABNORMAL HIGH (ref 4.0–10.5)
nRBC: 0 % (ref 0.0–0.2)

## 2022-11-25 LAB — BASIC METABOLIC PANEL
Anion gap: 11 (ref 5–15)
BUN: 55 mg/dL — ABNORMAL HIGH (ref 8–23)
CO2: 41 mmol/L — ABNORMAL HIGH (ref 22–32)
Calcium: 9.7 mg/dL (ref 8.9–10.3)
Chloride: 104 mmol/L (ref 98–111)
Creatinine, Ser: 1.6 mg/dL — ABNORMAL HIGH (ref 0.61–1.24)
GFR, Estimated: 44 mL/min — ABNORMAL LOW (ref >60)
Glucose, Bld: 149 mg/dL — ABNORMAL HIGH (ref 70–99)
Potassium: 3.7 mmol/L (ref 3.5–5.1)
Sodium: 156 mmol/L — ABNORMAL HIGH (ref 135–145)

## 2022-11-25 LAB — GLUCOSE, CAPILLARY
Glucose-Capillary: 146 mg/dL — ABNORMAL HIGH (ref 70–99)
Glucose-Capillary: 137 mg/dL — ABNORMAL HIGH (ref 70–99)
Glucose-Capillary: 150 mg/dL — ABNORMAL HIGH (ref 70–99)
Glucose-Capillary: 143 mg/dL — ABNORMAL HIGH (ref 70–99)

## 2022-11-25 MED ORDER — ACETAMINOPHEN 500 MG PO TABS
1000.0000 mg | ORAL_TABLET | Freq: Three times a day (TID) | ORAL | Status: DC
  Administered 2022-11-25 – 2022-12-06 (×31): 1000 mg via ORAL
  Filled 2022-11-25 (×34): qty 2

## 2022-11-25 MED ORDER — POLYETHYLENE GLYCOL 3350 17 G PO PACK
17.0000 g | PACK | Freq: Two times a day (BID) | ORAL | Status: DC
  Administered 2022-11-25 – 2022-11-27 (×5): 17 g via ORAL
  Filled 2022-11-25 (×6): qty 1

## 2022-11-25 MED ORDER — DEXTROSE-NACL 5-0.45 % IV SOLN: INTRAVENOUS | Status: AC

## 2022-11-25 MED ORDER — OXYCODONE HCL 5 MG PO TABS
5.0000 mg | ORAL_TABLET | Freq: Four times a day (QID) | ORAL | Status: AC
  Administered 2022-11-25 – 2022-11-28 (×11): 5 mg via ORAL
  Filled 2022-11-25 (×12): qty 1

## 2022-11-25 NOTE — TOC Progression Note (Signed)
Transition of Care [facility]) - Progression Note    Patient Details  Name: [name] [name] MRN: [id number] Date of Birth: 1946-06-26  [facility] CM/SW Contact  Kermit Balo, RN Phone Number: 11/25/2022, 2:31 PM  Clinical Narrative:    Pt is s/p: THORACIC ELEVEN TO LUMBAR TWO OPEN REDUCTION INTERNAL FIXATION  Current recommendations are for CIR.  TOC following.   Expected Discharge Plan:  (to be determined) Barriers to Discharge: Continued Medical Work up  Expected Discharge Plan and Services   Discharge Planning Services: CM Consult   Living arrangements for the past 2 months: Single Family Home                                       Social Determinants of Health (SDOH) Interventions SDOH Screenings   Depression (PHQ2-9): Low Risk  (10/08/2022)  Tobacco Use: Medium Risk (11/25/2022)    Readmission Risk Interventions     No data to display

## 2022-11-25 NOTE — Evaluation (Signed)
Physical Therapy Evaluation Patient Details Name: [name] [name] MRN: [id number] DOB: [birth year] Today's Date: 11/25/2022  History of Present Illness  Pt is a 77 y.o. male who presented 11/18/22 after falling down a few stairs at home. Pt sustained T12-L1 unstable fx. S/p ORIF of T11-L2 4/16. PMH: morbid obesity, OSA, OHS, DM2, HTN, HLD, COPD, severe pulmonary hypertension, chronic diastolic and right-sided CHF, chronic 3 to 5 L oxygen requirement, permanent A-fib on Eliquis, migraine, arthritis, gout   Clinical Impression  Pt presents with condition above and deficits mentioned below, see PT Problem List. PTA, he was independent, intermittently supporting himself on his O2 tank, living with his daughter in a house with 6 STE. He is on 6L O2 at baseline. Currently, pt is slow to process cues and initiate mobility and unable to describe his R leg pain or identify exactly where it hurts. It is unclear if these cognitive deficits are due to him recently receiving IV pain meds or not. Pt tends to rest his R hip in external rotation and is reporting pain with all ROM/palpation throughout the leg. Pt is demonstrating deficits also in gross strength (L leg stronger than his R, potentially due to his R leg pain limiting him), balance, and activity tolerance. Despite the pain though, pt was motivated and agreeable to sitting up EOB for >10 min. Pt required mod-maxA to roll (rolls better to his R), total assist x2 for sidelying <> sit transitions, and maxAx2 to scoot along EOB. Pt displays a posterior lean sitting EOB, needing min-modA majority of the time for balance. Pt is motivated to participate despite the pain, has good family support, and has had a drastic functional decline. He could greatly benefit from intensive therapy, >3 hours/day. Will continue to follow acutely.     Recommendations for follow up therapy are one component of a multi-disciplinary discharge planning process, led by the attending physician.   Recommendations may be updated based on patient status, additional functional criteria and insurance authorization.  Follow Up Recommendations       Assistance Recommended at Discharge Frequent or constant Supervision/Assistance  Patient can return home with the following  Two people to help with walking and/or transfers;Two people to help with bathing/dressing/bathroom;Assistance with cooking/housework;Direct supervision/assist for financial management;Direct supervision/assist for medications management;Assist for transportation;Help with stairs or ramp for entrance    Equipment Recommendations Other (comment) (TBA)  Recommendations for Other Services  Rehab consult    Functional Status Assessment Patient has had a recent decline in their functional status and demonstrates the ability to make significant improvements in function in a reasonable and predictable amount of time.     Precautions / Restrictions Precautions Precautions: Fall;Back Precaution Booklet Issued: No Precaution Comments: reviewed spinal precautions; watch SpO2 (6L baseline) Required Braces or Orthoses: Spinal Brace Spinal Brace: Thoracolumbosacral orthotic;Applied in supine position (clamshell brace) Restrictions Weight Bearing Restrictions: No      Mobility  Bed Mobility Overal bed mobility: Needs Assistance Bed Mobility: Rolling, Sidelying to Sit, Sit to Sidelying Rolling: Mod assist, Emmanuelle assist Sidelying to sit: Total assist, +2 for physical assistance, +2 for safety/equipment, HOB elevated     Sit to sidelying: Total assist, +2 for physical assistance, +2 for safety/equipment, HOB elevated General bed mobility comments: Pt rolled either direction multiple times for donning and doffing of clamshell back brace. Pt tends to roll better to his R with improved ability to flex his L leg to push off bed. ModA to initiate roll to R, maxA to initiate  roll to L, maxA to roll far/closer towards prone either  direction. Total assist x2 needed to bring bil legs on/off bed and control pt's trunk with sit <> sidelying at R EOB, cuing pt throughout to push up from or descend onto his elbow.    Transfers Overall transfer level: Needs assistance Equipment used: None              Lateral/Scoot Transfers: Jahaad assist, +2 physical assistance, +2 safety/equipment General transfer comment: MaxAx2 using the bed pad as a sling and cuing pt to push through hands on bed surface and lean trunk anteriorly towards therapists to laterally scoot to R on EOB towards HOB 3x.    Ambulation/Gait               General Gait Details: unable  Stairs            Wheelchair Mobility    Modified Rankin (Stroke Patients Only)       Balance Overall balance assessment: Needs assistance Sitting-balance support: Single extremity supported, Bilateral upper extremity supported, No upper extremity supported, Feet supported Sitting balance-[name] Scale: Poor Sitting balance - Comments: Pt with posterior lean, needing mod-maxA initially but progressed to min-modA with intermittent min guard assist (up to ~10 sec) for static sitting balance. Min-modA for dynamic sitting balance. Postural control: Posterior lean     Standing balance comment: unable at this time                             Pertinent Vitals/Pain Pain Assessment Pain Assessment: Faces Faces Pain Scale: Hurts whole lot Pain Location: back, R leg Pain Descriptors / Indicators: Discomfort, Grimacing, Guarding, Moaning, Other (Comment) (pt unable to describe) Pain Intervention(s): Limited activity within patient's tolerance, Monitored during session, Premedicated before session, Repositioned    Home Living Family/patient expects to be discharged to:: Private residence Living Arrangements: Children (daughter) Available Help at Discharge: Family;Available 24 hours/day Type of Home: House Home Access: Stairs to enter Entrance  Stairs-Rails: Right;Can reach Technical sales engineer of Steps: 6   Home Layout: One level Home Equipment: None      Prior Function Prior Level of Function : Needs assist             Mobility Comments: Ambulates without AD but pt daughter states he "uses his Oxygen cart to stabilize himself when walking". On 6L O2 at baseline ADLs Comments: Assist with socks and shoes     Hand Dominance   Dominant Hand: Left    Extremity/Trunk Assessment   Upper Extremity Assessment Upper Extremity Assessment: Defer to OT evaluation    Lower Extremity Assessment Lower Extremity Assessment: RLE deficits/detail;LLE deficits/detail RLE Deficits / Details: reports inability to detect touch at feet with hx of peripheral neuropathy, but unclear if pt could feel touch at knees or superiorly; gross weakness, difficult to fully assess due to pain, but more difficulty lifting to extend his R knee against gravity than his L, achieving >50%  but not full AROM on R; holds R hip in external rotationl; reports pain "all over" at R leg and unable to identify specific location of pain or describe pain RLE Sensation: history of peripheral neuropathy LLE Deficits / Details: reports inability to detect touch at feet with hx of peripheral neuropathy, but unclear if pt could feel touch at knees or superiorly; gross weakness, difficult to fully assess due to pain, but able to extend L knee against gravity fully and withstand  some overpressure LLE Sensation: history of peripheral neuropathy    Cervical / Trunk Assessment Cervical / Trunk Assessment: Back Surgery  Communication   Communication: No difficulties (soft spoken)  Cognition Arousal/Alertness: Awake/alert Behavior During Therapy: Flat affect Overall Cognitive Status: Difficult to assess                                 General Comments: Pt slow to process cues and initiate mobility, potentially being impacted by the IV pain meds  provided prior to session. Pt could benefit from further assessment        General Comments General comments (skin integrity, edema, etc.): SpO2 in 90s% on 4L at rest, dropped to 80s% sitting EOB thus bumped up O2 to 6L with SpO2 recovering to 90s%; BP 122/80 (93) supine, 114/66 (82) sitting    Exercises General Exercises - Lower Extremity Long Arc Quad: AROM, Strengthening, Both, Other reps (comment), Seated (x5-8 reps each, not achieving full AROM on R)   Assessment/Plan    PT Assessment Patient needs continued PT services  PT Problem List Decreased strength;Decreased activity tolerance;Decreased balance;Decreased mobility;Decreased coordination;Decreased cognition;Decreased knowledge of precautions;Cardiopulmonary status limiting activity;Pain       PT Treatment Interventions DME instruction;Gait training;Stair training;Functional mobility training;Therapeutic exercise;Balance training;Therapeutic activities;Neuromuscular re-education;Cognitive remediation;Patient/family education;Wheelchair mobility training    PT Goals (Current goals can be found in the Care Plan section)  Acute Rehab PT Goals Patient Stated Goal: to reduce pain PT Goal Formulation: With patient/family Time For Goal Achievement: 12/09/22 Potential to Achieve Goals: Good    Frequency Min 5X/week     Co-evaluation PT/OT/SLP Co-Evaluation/Treatment: Yes Reason for Co-Treatment: For patient/therapist safety;To address functional/ADL transfers PT goals addressed during session: Mobility/safety with mobility;Balance;Strengthening/ROM         AM-PAC PT "6 Clicks" Mobility  Outcome Measure Help needed turning from your back to your side while in a flat bed without using bedrails?: A Lot Help needed moving from lying on your back to sitting on the side of a flat bed without using bedrails?: Total Help needed moving to and from a bed to a chair (including a wheelchair)?: Total Help needed standing up from a  chair using your arms (e.g., wheelchair or bedside chair)?: Total Help needed to walk in hospital room?: Total Help needed climbing 3-5 steps with a railing? : Total 6 Click Score: 7    End of Session Equipment Utilized During Treatment: Back brace Activity Tolerance: Patient limited by pain Patient left: in bed;with call bell/phone within reach;with bed alarm set;with SCD's reapplied;Other (comment);with family/visitor present;with nursing/sitter in room (rolled to L with heels floating) Nurse Communication: Other (comment) (IV leaking) PT Visit Diagnosis: Unsteadiness on feet (R26.81);Muscle weakness (generalized) (M62.81);Difficulty in walking, not elsewhere classified (R26.2);Pain Pain - Right/Left: Right Pain - part of body: Leg (and back)    Time: 1027-2536 PT Time Calculation (min) (ACUTE ONLY): 60 min   Charges:   PT Evaluation $PT Eval Moderate Complexity: 1 Mod PT Treatments $Therapeutic Activity: 8-22 mins        Raymond Gurney, PT, DPT Acute Rehabilitation Services  Office: [phone]   Jewel Baize 11/25/2022, 11:58 AM

## 2022-11-25 NOTE — Progress Notes (Signed)
Speech Language Pathology Treatment: Dysphagia  Patient Details Name: [name] [name] MRN: [id number] DOB: 1945-11-03 Today's Date: 11/25/2022 Time: 4098-1191 SLP Time Calculation (min) (ACUTE ONLY): 14 min  Assessment / Plan / Recommendation Clinical Impression  Pt seen for ongoing dysphagia management. Pt out of ICU and on floor. Seemingly with improved mental status, which family endorses. Pt c/o throat pain.  Voice still somewhat weak per family.  With prolonged phonation with diaphragmatic breating vocal quality is strong and clear suggesting good VF function post extubation (ETT for procedure). Pt took pills with liquid wash with no clinical s/s of aspiration.  With further trials of thin liquid by straw there was delayed throat clear x1 and pt denied water going down the wrong way.  There was grimacing with AM meal tray (scrabled eggs) and pt endorsed odynophagia.  Delayed throat clear x1 with eggs as well.  Suggesting choosing very easy foods (soup, mashed potatoes) while sore throat persists.  Consider throat spray/chloroseptic for pain if indicated.  Pt appears safe to continue current diet.  Pt has no further ST needs at this time.  SLP will sign off.  Recommend continuing regular texture diet with thin liquids.     HPI HPI: [name] [name] is 77 year old male who sustained a mechanical fall when he missed a step and fell onto his back and reported to the ER 11/18/2022 with resultant severe back pain.  Pt found to have T12-L1 fracture, no s/p surgical repair. Pt with a history of chronic atrial fibrillation on Eliquis, chronic diastolic CHF, severe pulmonary hypertension with cor pulmonale, chronic hypoxic respiratory failure requiring 3-5 L continuous O2 support, HTN, HLD, and OSA.      SLP Plan  All goals met;Discharge SLP treatment due to (comment)      Recommendations for follow up therapy are one component of a multi-disciplinary discharge planning process, led by the attending physician.   Recommendations may be updated based on patient status, additional functional criteria and insurance authorization.    Recommendations  Diet recommendations: Regular;Thin liquid Liquids provided via: Cup;Straw Medication Administration: Whole meds with liquid Compensations: Slow rate;Small sips/bites Postural Changes and/or Swallow Maneuvers: Seated upright 90 degrees (as tolerated)                  Oral care BID   None Dysphagia, unspecified (R13.10)     All goals met;Discharge SLP treatment due to (comment)     Kerrie Pleasure, MA, CCC-SLP Acute Rehabilitation Services Office: [phone] 11/25/2022, 9:26 AM

## 2022-11-25 NOTE — Progress Notes (Signed)
PROGRESS NOTE  [name] [name]  DOB: 05/27/1946  PCP: Corwin Levins, MD ZOX:[id number]  DOA: 11/18/2022  LOS: 7 days  Hospital Day: 8  Brief narrative: [name] [name] is a 77 y.o. male with PMH significant for morbid obesity, OSA, OHS, DM2, HTN, HLD, COPD, severe pulmonary hypertension, chronic diastolic and right-sided CHF, chronic 3 to 5 L oxygen requirement, permanent A-fib on Eliquis, migraine, arthritis, gout. 4/11, patient presented to the ED after a mechanical fall when he missed a step and fell onto his back with resultant severe back pain.  In the ED, CT spine noted T12/L1 level widening of the anterior disc space and mildly displaced fracture of the posterior superior endplate of L1. Admitted to [facility] Neurosurgery recommended surgical stabilization 4/16, patient underwent ORIF of the fracture, T11-L2 instrumented stabilization.  Subjective: Patient was seen and examined this morning Propped up in bed.  Complains of inadequate pain control.  Daughter at bedside. Labs from this morning with creatinine rising up and sodium rising up as well.  Assessment and plan: T12-L1 endplate spine fracture d/t mechanical fall S/p ORIF and instrumentation T11-L2 Currently in pain management with Tylenol as needed, Dilaudid as needed, oxycodone as needed Currently on SCDs for DVT prophylaxis.  Reported allergy to heparin products Per neurosurgery, okay to resume Eliquis on 4/19 PT/OT eval obtained.  CIR recommended.  Chronic atrial fibrillation  PTA on Cardizem 180 mg daily, metoprolol 25 mg twice daily Currently continued on both. Anticoagulation plan as above.     Chronic diastolic CHF Severe pulmonary hypertension with cor pulmonale PTA on metoprolol 25 mg twice daily, torsemide 60-80 mg twice daily It seems patient was over diuresed and hence his creatinine and sodium level are running elevated.  Keep torsemide on hold. Continue metoprolol.    AKI on CKD 3a Contraction  alkalosis Hypernatremia Baseline creatinine less than 1.2.   Creatinine, sodium and bicarbonate level are all trending up.   Stop torsemide.  Start on D5 half NS for next 24 hours.  Recent Labs    04/30/22 1101 07/22/22 0909 10/08/22 1611 11/18/22 1228 11/20/22 0325 11/21/22 0310 11/22/22 0342 11/23/22 0604 11/24/22 0243 11/25/22 0316  BUN 28* 33* 44* 55*  CREATININE 1.23 1.23 1.19 1.26* 1.34* 1.38* 1.28* 1.30* 1.48* 1.60*  CO2 36* 34* 34* 30 33* 32 31 37* 35* 41*    Recent Labs  Lab 11/20/22 0325 11/21/22 0310 11/22/22 0342 11/23/22 0604 11/24/22 0243 11/25/22 0316  NA 139 137 142 148* 148* 156*    Hypokalemia Potassium level improved with replacement Recent Labs  Lab 11/20/22 0325 11/21/22 0310 11/22/22 0342 11/23/22 0604 11/24/22 0243 11/25/22 0316  K 3.6 4.7 4.0 3.3* 4.0 3.7  MG 2.0  --   --  2.1  --   --     Morbid Obesity  Body mass index is 41.81 kg/m. Patient has been advised to make an attempt to improve diet and exercise patterns to aid in weight loss.   OHS/Sleep apnea Chronic hypoxic respiratory failure Continue usual supportive oxygen at baseline of 4-6LPM as confirmed by daughter - utilizing Venturi facemask as needed as patient often mouth breathes Continue nightly BiPAP.  Type 2 diabetes mellitus A1c 7 on March 2024 PTA on metformin 1000 mg daily, glipizide 10 mg daily Currently both of them are on hold.  Continue sliding scale insulin with Accu-Cheks.  Once appetite improves, may need long-acting insulin or reinitiation of oral meds. Recent Labs  Lab  11/24/22 1137 11/24/22 1659 11/24/22 2105 11/25/22 0613 11/25/22 1116  GLUCAP 173* 155* 156* 146* 137*     HLD Lipitor  Gout Allopurinol  Constipation Continue Senokot scheduled and as needed Dulcolax.  No bowel movement charted in several days.  Ordered scheduled MiraLAX.   Mobility: PT OT eval  Goals of care   Code Status: Full Code     DVT  prophylaxis: Plan to resume Eliquis on 4/19 SCDs Start: 11/18/22 1455   Antimicrobials: None Fluid: None Consultants: Neurosurgery Family Communication: Family at bedside  Status: Inpatient Level of care:  Progressive   Needs to continue in-hospital care:  Abnormal electrolytes, fluid balance impaired.  Patient from: Home Anticipated d/c to: Pending clinical course   Diet:  Diet Order             Diet regular Room service appropriate? Yes; Fluid consistency: Thin  Diet effective now                   Scheduled Meds:  allopurinol  100 mg Oral Daily   atorvastatin  20 mg Oral Daily   Chlorhexidine Gluconate Cloth  6 each Topical q morning   diltiazem  180 mg Oral Daily   insulin aspart  0-15 Units Subcutaneous TID WC   insulin aspart  0-5 Units Subcutaneous QHS   metoprolol tartrate  25 mg Oral BID   polyethylene glycol  17 g Oral Daily   senna-docusate  1 tablet Oral BID    PRN meds: acetaminophen, albuterol, bisacodyl, HYDROmorphone (DILAUDID) injection, oxyCODONE, phenol   Infusions:   dextrose 5 % and 0.45% NaCl 75 mL/hr at 11/25/22 1016    Antimicrobials: Anti-infectives (From admission, onward)    Start     Dose/Rate Route Frequency Ordered Stop   11/23/22 0600  ceFAZolin (ANCEF) IVPB 3g/100 mL premix        3 g 200 mL/hr over 30 Minutes Intravenous On call to O.R. 11/22/22 0939 11/23/22 1650       Nutritional status:  Body mass index is 41.81 kg/m.          Objective: Vitals:   11/25/22 0726 11/25/22 1120  BP: 137/77 132/77  Pulse: 95 (!) 45  Resp: 18   Temp: 98.1 F (36.7 C) 98.2 F (36.8 C)  SpO2: 95% 92%    Intake/Output Summary (Last 24 hours) at 11/25/2022 1402 Last data filed at 11/25/2022 1124 Gross per 24 hour  Intake 50 ml  Output 1250 ml  Net -1200 ml    Filed Weights   11/18/22 1104  Weight: 124.7 kg   Weight change:  Body mass index is 41.81 kg/m.   Physical Exam: General exam: Pleasant, elderly  Caucasian male.  Partially controlled pain Skin: No rashes, lesions or ulcers. HEENT: Atraumatic, normocephalic, no obvious bleeding Lungs: Clear to auscultation bilaterally.. CVS: Regular rate and rhythm, no murmur soft GI/Abd: Nontender, nondistended, bowel sound present CNS: Alert, awake, slow to respond but oriented Psychiatry: Sad affect Extremities: No pedal edema, no calf tenderness  Data Review: I have personally reviewed the laboratory data and studies available.  F/u labs ordered Unresulted Labs (From admission, onward)     Start     Ordered   11/25/22 0500  CBC with Differential/Platelet  Daily,   R     Question:  Specimen collection method  Answer:  Lab=Lab collect   11/24/22 1330   11/25/22 0500  Basic metabolic panel  Daily,   R     Question:  Specimen collection method  Answer:  Lab=Lab collect   11/24/22 1330            Total time spent in review of labs and imaging, patient evaluation, formulation of plan, documentation and communication with family: 45 minutes  Signed, Lorin Glass, MD [facility] 11/25/2022

## 2022-11-25 NOTE — Progress Notes (Signed)
Inpatient Rehab Admissions Coordinator Note:   Per therapy recommendations patient was screened for CIR candidacy by Stephania Fragmin, PT. At this time, pt appears to be a potential candidate for CIR. I will place an order for rehab consult for full assessment, per our protocol.  Please contact me any with questions.Estill Dooms, PT, DPT [phone] 11/25/22 12:45 PM

## 2022-11-25 NOTE — Progress Notes (Signed)
Pt refused use of BIPAP to rest for the evening and would like to remain on nasal cannula. No machine at bedside.

## 2022-11-25 NOTE — Progress Notes (Signed)
  NEUROSURGERY PROGRESS NOTE   No issues overnight, unchanged appropriate back soreness, no new N/T/W of LE.  EXAM:  BP 137/77 (BP Location: Left Arm)   Pulse 95   Temp 98.1 F (36.7 C) (Oral)   Resp 18   Ht  (1.727 m)   Wt 124.7 kg   SpO2 95%   BMI 41.81 kg/m   Awake, alert Speech fluent, appropriate  CN grossly intact  Good strength BLE   IMPRESSION:  77 y.o. male POD#2 T11-L2 stabilization of T12-L1 fracture, neurologically stable Multiple medical comorbidities  PLAN: - PT/OT today with TLSO brace for OOB - Plan on restarting Eliquis on Friday, 11/26/2022   Lisbeth Renshaw, MD [facility]

## 2022-11-25 NOTE — Evaluation (Signed)
Occupational Therapy Evaluation Patient Details Name: [name] [name] MRN: [id number] DOB: 01/07/1946 Today's Date: 11/25/2022   History of Present Illness Pt is a 77 y.o. male who presented 11/18/22 after falling down a few stairs at home. Pt sustained T12-L1 unstable fx. S/p ORIF of T11-L2 4/16. PMH: morbid obesity, OSA, OHS, DM2, HTN, HLD, COPD, severe pulmonary hypertension, chronic diastolic and right-sided CHF, chronic 3 to 5 L oxygen requirement, permanent A-fib on Eliquis, migraine, arthritis, gout   Clinical Impression        Pt was admitted as above and deficits as listed below, see OT Problem List. PTA, he was independent with functional mobility, intermittently supporting himself on his O2 tank, living with his daughter and 63 y/o grandson in a house with 6 STE. His daughter states that she assists with donning and doffing his shoes and socks at baseline. He is on 6L O2 at baseline. Pt is currently slow to process cues and initiate mobility and unable to describe his R leg pain or identify exactly where it hurts. It is noted that pt had had IV pain medications prior to this assessment. He currently presents with generalized weakness, deficits in activity tolerance, decreased ability to perform functional mobility and all ADL's. Despite pain though, pt is motivated and agreeable to sitting up EOB for >10 min. He currently requires mod-maxA to roll (rolls better to his R), total assist x2 for sidelying <> sit transitions, and maxAx2 to scoot along EOB. Pt displays a posterior lean sitting EOB when performing grooming activity/washing his face seated at EOB, needing min-modA majority of the time for balance, but Mod-Romulo during ADL/dynamic activities. He has a supportive family and is motivated to participate in skilled therapy despite the pain as he has had a drastic functional decline. He would be a good candidate for in patient rehab >3 hours/day. Will follow acutely for OT to assist in maximizing  independence with ADL's and functional transfers related to ADL's.        Recommendations for follow up therapy are one component of a multi-disciplinary discharge planning process, led by the attending physician.  Recommendations may be updated based on patient status, additional functional criteria and insurance authorization.   Assistance Recommended at Discharge PRN  Patient can return home with the following Two people to help with walking and/or transfers;Two people to help with bathing/dressing/bathroom;Assist for transportation;Help with stairs or ramp for entrance;Assistance with cooking/housework    Functional Status Assessment  Patient has had a recent decline in their functional status and demonstrates the ability to make significant improvements in function in a reasonable and predictable amount of time.  Equipment Recommendations  Other (comment) (Defer to next venue)    Recommendations for Other Services Rehab consult     Precautions / Restrictions Precautions Precautions: Fall;Back Precaution Booklet Issued: No Precaution Comments: reviewed spinal precautions; watch SpO2 (6L baseline) Required Braces or Orthoses: Spinal Brace Spinal Brace: Thoracolumbosacral orthotic;Applied in supine position (clamshell brace) Restrictions Weight Bearing Restrictions: No      Mobility Bed Mobility Overal bed mobility: Needs Assistance Bed Mobility: Rolling, Sidelying to Sit, Sit to Sidelying Rolling: Mod assist, Ramiro assist Sidelying to sit: Total assist, +2 for physical assistance, +2 for safety/equipment, HOB elevated     Sit to sidelying: Total assist, +2 for physical assistance, +2 for safety/equipment, HOB elevated General bed mobility comments: Pt rolled either direction multiple times for donning and doffing of clamshell back brace. Pt tends to roll better to his R with improved  ability to flex his L leg to push off bed. ModA to initiate roll to R, maxA to initiate roll to  L, maxA to roll far/closer towards prone either direction. Total assist x2 needed to bring bil legs on/off bed and control pt's trunk with sit <> sidelying at R EOB, cuing pt throughout to push up from or descend onto his elbow.    Transfers Overall transfer level: Needs assistance Equipment used: None      Lateral/Scoot Transfers: [place] assist, +2 physical assistance, +2 safety/equipment General transfer comment: MaxAx2 using the bed pad as a sling and cuing pt to push through hands on bed surface and lean trunk anteriorly towards therapists to laterally scoot to R on EOB towards HOB 3x.      Balance Overall balance assessment: Needs assistance Sitting-balance support: Single extremity supported, Bilateral upper extremity supported, No upper extremity supported, Feet supported Sitting balance-[name] Scale: Poor Sitting balance - Comments: Pt with posterior lean, needing mod-maxA initially but progressed to min-modA with intermittent min guard assist (up to ~10 sec) for static sitting balance. Min-modA for dynamic sitting balance. Postural control: Posterior lean     Standing balance comment: unable at this time     ADL either performed or assessed with clinical judgement   ADL Overall ADL's : Needs assistance/impaired Eating/Feeding: Set up;Bed level   Grooming: Wash/dry face;Sitting;Moderate assistance;Maximal assistance;Cueing for safety Grooming Details (indicate cue type and reason): Sitting up at EOB with heavy Mod-Nole assist, decreasing to Min at times Upper Body Bathing: Maximal assistance;Total assistance;Sitting   Lower Body Bathing: Total assistance;+2 for physical assistance;+2 for safety/equipment;Bed level   Upper Body Dressing : Total assistance;Sitting;Maximal assistance   Lower Body Dressing: Maximal assistance;Total assistance;+2 for physical assistance;+2 for safety/equipment;Bed level     Toilet Transfer Details (indicate cue type and reason): To be determined  (consider use of bariatric Stedy)         Functional mobility during ADLs:  (Pt able to sit at EOB w/ +2 assist this date, OOB activity TBD) General ADL Comments: Pt and his daughter were educated in role of OT and recommendations acute care and AIR. Pt is currently limited by pain and overall decreased mobility but willing to work. Pt participated in bed mobility, sitting balance at EOB and grooming sitting at EOB.     Vision Ability to See in Adequate Light: 0 Adequate Vision Assessment?: No apparent visual deficits            Pertinent Vitals/Pain Pain Assessment Pain Assessment: Faces Faces Pain Scale: Hurts whole lot Pain Location: back, R leg Pain Descriptors / Indicators: Discomfort, Grimacing, Guarding, Moaning, Other (Comment) (Pt unable to describe) Pain Intervention(s): Limited activity within patient's tolerance, Monitored during session, Premedicated before session, Repositioned     Hand Dominance Left   Extremity/Trunk Assessment Upper Extremity Assessment Upper Extremity Assessment: Generalized weakness;Overall [facility] for tasks assessed   Lower Extremity Assessment Lower Extremity Assessment: Defer to PT evaluation RLE Deficits / Details: reports inability to detect touch at feet with hx of peripheral neuropathy, but unclear if pt could feel touch at knees or superiorly; gross weakness, difficult to fully assess due to pain, but more difficulty lifting to extend his R knee against gravity than his L, achieving >50%  but not full AROM on R; holds R hip in external rotationl; reports pain "all over" at R leg and unable to identify specific location of pain or describe pain RLE Sensation: history of peripheral neuropathy LLE Deficits / Details: reports  inability to detect touch at feet with hx of peripheral neuropathy, but unclear if pt could feel touch at knees or superiorly; gross weakness, difficult to fully assess due to pain, but able to extend L knee against gravity  fully and withstand some overpressure LLE Sensation: history of peripheral neuropathy   Cervical / Trunk Assessment Cervical / Trunk Assessment: Back Surgery   Communication Communication Communication: No difficulties (soft spoken)   Cognition Arousal/Alertness: Awake/alert Behavior During Therapy: Flat affect Overall Cognitive Status: Difficult to assess     General Comments: Pt slow to process cues and initiate mobility, potentially being impacted by the IV pain meds provided prior to session. Pt could benefit from further assessment     General Comments  SpO2 in 90's on 4L at rest, dropped to 80's in sitting EOB thus bumped up to 6L w/ SpO2 recovering intop 90"s%; BP 122/80 (93) supine, 114/66/ (82) sitting            Home Living Family/patient expects to be discharged to:: Private residence Living Arrangements: Children (daughter and grandson (57 y/o)) Available Help at Discharge: Family;Available 24 hours/day Type of Home: House Home Access: Stairs to enter [employer] of Steps: 6 Entrance Stairs-Rails: Right;Can reach both;Left Home Layout: One level     Bathroom Shower/Tub: Chief Strategy Officer: Standard   Home Equipment: None      Prior Functioning/Environment Prior Level of Function : Needs assist   Mobility Comments: Ambulates without AD but pt daughter states he "uses his Oxygen cart to stabilize himself when walking". On 6L O2 at baseline ADLs Comments: Assist with socks and shoes        OT Problem List: Decreased strength;Decreased knowledge of use of DME or AE;Decreased knowledge of precautions;Obesity;Decreased activity tolerance;Impaired balance (sitting and/or standing);Pain      OT Treatment/Interventions: Self-care/ADL training;Therapeutic exercise;Patient/family education;Balance training;Energy conservation;Therapeutic activities;DME and/or AE instruction    OT Goals(Current goals can be found in the care plan  section) Acute Rehab OT Goals Patient Stated Goal: None stated. Decreased pain with movement OT Goal Formulation: With patient/family Time For Goal Achievement: 12/09/22 Potential to Achieve Goals: Good ADL Goals Pt Will Perform Grooming: with min assist;sitting;standing;with caregiver independent in assisting Pt Will Transfer to Toilet: with supervision;with min assist;stand pivot transfer;bedside commode;regular height toilet;ambulating (bariatric 3:1 vs toilet) Pt Will Perform Toileting - Clothing Manipulation and hygiene: with supervision;with min assist;with caregiver independent in assisting;with adaptive equipment;sit to/from stand;sitting/lateral leans Additional ADL Goal #1: Pt will be supervision bed mobility and Min A TLSO application in preparation for increased participation with ADL's (caregiver/family independent with assisting). Additional ADL Goal #2: Pt will be Mod I seated ADL's at EOB for or more in preparation for increased participation with activities of daily living  OT Frequency: Min 2X/week    Co-evaluation PT/OT/SLP Co-Evaluation/Treatment: Yes Reason for Co-Treatment: For patient/therapist safety;To address functional/ADL transfers PT goals addressed during session: Mobility/safety with mobility;Balance;Strengthening/ROM OT goals addressed during session: ADL's and self-care;Proper use of Adaptive equipment and DME;Other (comment) (bed mobility, balance)      AM-PAC OT "6 Clicks" Daily Activity     Outcome Measure Help from another person eating meals?: A Little Help from another person taking care of personal grooming?: A Lot Help from another person toileting, which includes using toliet, bedpan, or urinal?: Total Help from another person bathing (including washing, rinsing, drying)?: A Lot Help from another person to put on and taking off regular upper body clothing?: A Lot Help from  another person to put on and taking off regular lower body clothing?:  Total 6 Click Score: 11   End of Session Equipment Utilized During Treatment: Oxygen;Back brace Nurse Communication: Mobility status;Other (comment) (IV dripping right arm, RN was made aware and assessed)  Activity Tolerance: Patient limited by pain Patient left: in bed;with call bell/phone within reach;with bed alarm set  OT Visit Diagnosis: Other abnormalities of gait and mobility (R26.89);Muscle weakness (generalized) (M62.81);History of falling (Z91.81);Pain Pain - Right/Left:  (Back, R LE) Pain - part of body:  (Back and R LE)                Time: 1610-9604 OT Time Calculation (min): 62 min Charges:  OT General Charges $OT Visit: 1 Visit OT Evaluation $OT Eval Moderate Complexity: 1 Mod OT Treatments $Self Care/Home Management : 8-22 mins  Zuhayr Deeney Beth Dixon, OTR/L 11/25/2022, 12:47 PM

## 2022-11-26 DIAGNOSIS — S32018G Other fracture of first lumbar vertebra, subsequent encounter for fracture with delayed healing: Secondary | ICD-10-CM | POA: Diagnosis not present

## 2022-11-26 LAB — CBC WITH DIFFERENTIAL/PLATELET
Abs Immature Granulocytes: 0.04 10*3/uL (ref 0.00–0.07)
Basophils Absolute: 0 10*3/uL (ref 0.0–0.1)
Basophils Relative: 0 %
Eosinophils Absolute: 0 10*3/uL (ref 0.0–0.5)
Eosinophils Relative: 0 %
HCT: 42.9 % (ref 39.0–52.0)
Hemoglobin: 12.8 g/dL — ABNORMAL LOW (ref 13.0–17.0)
Immature Granulocytes: 0 %
Lymphocytes Relative: 21 %
Lymphs Abs: 2.1 10*3/uL (ref 0.7–4.0)
MCH: 28.5 pg (ref 26.0–34.0)
MCHC: 29.8 g/dL — ABNORMAL LOW (ref 30.0–36.0)
MCV: 95.5 fL (ref 80.0–100.0)
Monocytes Absolute: 1.3 10*3/uL — ABNORMAL HIGH (ref 0.1–1.0)
Monocytes Relative: 13 %
Neutro Abs: 6.5 10*3/uL (ref 1.7–7.7)
Neutrophils Relative %: 66 %
Platelets: 253 10*3/uL (ref 150–400)
RBC: 4.49 MIL/uL (ref 4.22–5.81)
RDW: 16.2 % — ABNORMAL HIGH (ref 11.5–15.5)
WBC: 9.9 10*3/uL (ref 4.0–10.5)
nRBC: 0 % (ref 0.0–0.2)

## 2022-11-26 LAB — BASIC METABOLIC PANEL
Anion gap: 10 (ref 5–15)
BUN: 47 mg/dL — ABNORMAL HIGH (ref 8–23)
CO2: 36 mmol/L — ABNORMAL HIGH (ref 22–32)
Calcium: 9.6 mg/dL (ref 8.9–10.3)
Chloride: 107 mmol/L (ref 98–111)
Creatinine, Ser: 1.54 mg/dL — ABNORMAL HIGH (ref 0.61–1.24)
GFR, Estimated: 46 mL/min — ABNORMAL LOW (ref >60)
Glucose, Bld: 204 mg/dL — ABNORMAL HIGH (ref 70–99)
Potassium: 4.9 mmol/L (ref 3.5–5.1)
Sodium: 153 mmol/L — ABNORMAL HIGH (ref 135–145)

## 2022-11-26 LAB — GLUCOSE, CAPILLARY
Glucose-Capillary: 163 mg/dL — ABNORMAL HIGH (ref 70–99)
Glucose-Capillary: 135 mg/dL — ABNORMAL HIGH (ref 70–99)
Glucose-Capillary: 154 mg/dL — ABNORMAL HIGH (ref 70–99)
Glucose-Capillary: 171 mg/dL — ABNORMAL HIGH (ref 70–99)
Glucose-Capillary: 142 mg/dL — ABNORMAL HIGH (ref 70–99)

## 2022-11-26 MED ORDER — DEXTROSE-NACL 5-0.45 % IV SOLN: INTRAVENOUS | Status: DC

## 2022-11-26 MED ORDER — APIXABAN 5 MG PO TABS
5.0000 mg | ORAL_TABLET | Freq: Two times a day (BID) | ORAL | Status: DC
  Administered 2022-11-26 – 2022-12-06 (×21): 5 mg via ORAL
  Filled 2022-11-26 (×22): qty 1

## 2022-11-26 MED ORDER — STROKE: EARLY STAGES OF RECOVERY BOOK
Status: AC
  Filled 2022-11-26: qty 1

## 2022-11-26 NOTE — Progress Notes (Signed)
Inpatient Rehab Coordinator Note:  I met with patient and his daughter, [name], at bedside to discuss CIR recommendations and goals/expectations of CIR stay.  We reviewed 3 hrs/day of therapy, physician follow up, and average length of stay 2 weeks (dependent upon progress) with goals of min assist.  Between [name] and her 77 y/o son, pt has 24/7 support at home.  We discussed that pt may require some physical assist at discharge and they are open to that.  We discussed insurance auth requirements and I'd like to see how he does with therapy today.  Await updated notes and then I will open for auth.   Estill Dooms, PT, DPT Admissions Coordinator [phone] 11/26/22  12:51 PM

## 2022-11-26 NOTE — Progress Notes (Signed)
PROGRESS NOTE  [name] [name]  DOB: [birth year]  PCP: Corwin Levins, MD OZD:[id number]  DOA: 11/18/2022  LOS: 8 days  Hospital Day: 9  Brief narrative: [name] [name] is a 77 y.o. male with PMH significant for morbid obesity, OSA, OHS, DM2, HTN, HLD, COPD, severe pulmonary hypertension, chronic diastolic and right-sided CHF, chronic 3 to 5 L oxygen requirement, permanent A-fib on Eliquis, migraine, arthritis, gout. 4/11, patient presented to the ED after a mechanical fall when he missed a step and fell onto his back with resultant severe back pain.  In the ED, CT spine noted T12/L1 level widening of the anterior disc space and mildly displaced fracture of the posterior superior endplate of L1. Admitted to [facility] Neurosurgery recommended surgical stabilization 4/16, patient underwent ORIF of the fracture, T11-L2 instrumented stabilization.  Subjective: Patient was seen and examined this morning Sitting up in recliner.  Pain control is better.  Daughter at bedside. Labs this morning with improving sodium and creatinine.  Assessment and plan: T12-L1 endplate spine fracture d/t mechanical fall S/p ORIF and instrumentation T11-L2 Currently in pain management with Tylenol as needed, Dilaudid as needed, oxycodone as needed Currently on SCDs for DVT prophylaxis.  Reported allergy to heparin products With neurosurgery clearance, Eliquis has been resumed today 4/19.   PT/OT eval obtained.  CIR recommended.  Chronic atrial fibrillation  PTA on Cardizem 180 mg daily, metoprolol 25 mg twice daily Currently continued on both. Anticoagulation plan as above.     Chronic diastolic CHF Severe pulmonary hypertension with cor pulmonale PTA on metoprolol 25 mg twice daily, torsemide 60-80 mg twice daily It seems patient was over diuresed and hence his creatinine and sodium level are running elevated.  Currently diuretics on hold.  Continue metoprolol.  AKI on CKD 3a Contraction  alkalosis Hypernatremia Baseline creatinine less than 1.2.   Creatinine, sodium and bicarbonate level were all trending up.   Diuretics were held.  Patient was started on D5 half NS yesterday.  Improving labs.  Continue the same fluid plan for next 24 hours.  Recent Labs    07/22/22 0909 10/08/22 1611 11/18/22 1228 11/20/22 0325 11/21/22 0310 11/22/22 0342 11/23/22 0604 11/24/22 0243 11/25/22 0316 11/26/22 0438  BUN 28* 33* 44* 55* 47*  CREATININE 1.23 1.19 1.26* 1.34* 1.38* 1.28* 1.30* 1.48* 1.60* 1.54*  CO2 34* 34* 30 33* 32 31 37* 35* 41* 36*    Recent Labs  Lab 11/20/22 0325 11/21/22 0310 11/22/22 0342 11/23/22 0604 11/24/22 0243 11/25/22 0316 11/26/22 0438  NA 139 137 142 148* 148* 156* 153*    Morbid Obesity  Body mass index is 41.81 kg/m. Patient has been advised to make an attempt to improve diet and exercise patterns to aid in weight loss.   OHS/Sleep apnea Chronic hypoxic respiratory failure Continue usual supportive oxygen at baseline of 4-6LPM as confirmed by daughter - utilizing Venturi facemask as needed as patient often mouth breathes Encourage nightly BiPAP.  Did not tolerate last night.  Type 2 diabetes mellitus A1c 7 on March 2024 PTA on metformin 1000 mg daily, glipizide 10 mg daily Currently both of them are on hold.  Continue sliding scale insulin with Accu-Cheks.  Once appetite improves, may need long-acting insulin or reinitiation of oral meds. Recent Labs  Lab 11/25/22 1116 11/25/22 1629 11/25/22 2121 11/26/22 0612 11/26/22 1133  GLUCAP 137* 150* 143* 135* 154*     HLD Lipitor  Gout Allopurinol  Constipation Continue Senokot scheduled and  as needed Dulcolax.  No bowel movement charted in several days.  Currently on scheduled MiraLAX twice daily.   Mobility: PT OT eval  Goals of care   Code Status: Full Code     DVT prophylaxis: Plan to resume Eliquis on 4/19 SCDs Start: 11/18/22 1455 apixaban (ELIQUIS)  tablet 5 mg   Antimicrobials: None Fluid: None Consultants: Neurosurgery Family Communication: Daughter at bedside  Status: Inpatient Level of care:  Progressive   Needs to continue in-hospital care:  Abnormal electrolytes, fluid balance impaired.  Needs IV hydration. Patient from: Home Anticipated d/c to: Pending clinical course.  Being considered for CIR.   Diet:  Diet Order             Diet regular Room service appropriate? Yes; Fluid consistency: Thin  Diet effective now                   Scheduled Meds:  acetaminophen  1,000 mg Oral TID   allopurinol  100 mg Oral Daily   apixaban  5 mg Oral BID   atorvastatin  20 mg Oral Daily   Chlorhexidine Gluconate Cloth  6 each Topical q morning   diltiazem  180 mg Oral Daily   insulin aspart  0-15 Units Subcutaneous TID WC   insulin aspart  0-5 Units Subcutaneous QHS   metoprolol tartrate  25 mg Oral BID   oxyCODONE  5 mg Oral Q6H   polyethylene glycol  17 g Oral BID   senna-docusate  1 tablet Oral BID    PRN meds: albuterol, bisacodyl, HYDROmorphone (DILAUDID) injection, phenol   Infusions:   dextrose 5 % and 0.45% NaCl      Antimicrobials: Anti-infectives (From admission, onward)    Start     Dose/Rate Route Frequency Ordered Stop   11/23/22 0600  ceFAZolin (ANCEF) IVPB 3g/100 mL premix        3 g 200 mL/hr over 30 Minutes Intravenous On call to O.R. 11/22/22 0939 11/23/22 1650       Nutritional status:  Body mass index is 41.81 kg/m.          Objective: Vitals:   11/26/22 0725 11/26/22 1123  BP: 132/83 (!) 132/94  Pulse: 72 76  Resp: 19 18  Temp: 98.4 F (36.9 C) 98.5 F (36.9 C)  SpO2: 94% 96%    Intake/Output Summary (Last 24 hours) at 11/26/2022 1416 Last data filed at 11/26/2022 0900 Gross per 24 hour  Intake 1658.41 ml  Output 900 ml  Net 758.41 ml    Filed Weights   11/18/22 1104  Weight: 124.7 kg   Weight change:  Body mass index is 41.81 kg/m.   Physical  Exam: General exam: Pleasant, elderly Caucasian male.  Controlled.  On 6lpm O2 Skin: No rashes, lesions or ulcers. HEENT: Atraumatic, normocephalic, no obvious bleeding Lungs: Clear to auscultation bilaterally.. CVS: Regular rate and rhythm, no murmur soft GI/Abd: Nontender, nondistended, bowel sound present CNS: Alert, awake, slow to respond but oriented Psychiatry: Mood appropriate. Extremities: No pedal edema, no calf tenderness  Data Review: I have personally reviewed the laboratory data and studies available.  F/u labs ordered Unresulted Labs (From admission, onward)     Start     Ordered   11/25/22 0500  CBC with Differential/Platelet  Daily,   R     Question:  Specimen collection method  Answer:  Lab=Lab collect   11/24/22 1330   11/25/22 0500  Basic metabolic panel  Daily,   R  Question:  Specimen collection method  Answer:  Lab=Lab collect   11/24/22 1330            Total time spent in review of labs and imaging, patient evaluation, formulation of plan, documentation and communication with family: 45 minutes  Signed, Lorin Glass, MD [facility] 11/26/2022

## 2022-11-26 NOTE — Progress Notes (Signed)
Occupational Therapy Treatment Patient Details Name: [name] [name] MRN: [id number] DOB: 1946/04/09 Today's Date: 11/26/2022   History of present illness Pt is a 77 y.o. male who presented 11/18/22 after falling down a few stairs at home. Pt sustained T12-L1 unstable fx. S/p ORIF of T11-L2 4/16. PMH: morbid obesity, OSA, OHS, DM2, HTN, HLD, COPD, severe pulmonary hypertension, chronic diastolic and right-sided CHF, chronic 3 to 5 L oxygen requirement, permanent A-fib on Eliquis, migraine, arthritis, gout   OT comments  Patient premedicated prior to visit and seen with PT to address bed mobility, sitting  balance, and transfers. Patient performed rolling in bed to donn turtle shell brace and required Durelle assist of 2 to get to EOB. Patient demonstrating posterior leaning while on EOB and assisted with RUE for support. Patient stood into stedy with Dee assist +2 to power up but was able to maintain standing balance with min assist to min guard assist. Once transferred to recliner patient was placed in reclined position to adjust brace due to shifting during mobility. Patient will benefit from intensive inpatient follow up therapy, >3 hours/day to address self care, bed mobility, and functional transfers. Acute OT to continue to follow.    Recommendations for follow up therapy are one component of a multi-disciplinary discharge planning process, led by the attending physician.  Recommendations may be updated based on patient status, additional functional criteria and insurance authorization.    Assistance Recommended at Discharge PRN  Patient can return home with the following  Two people to help with walking and/or transfers;Two people to help with bathing/dressing/bathroom;Assist for transportation;Help with stairs or ramp for entrance;Assistance with cooking/housework   Equipment Recommendations  Other (comment) (defer to next venue)    Recommendations for Other Services Rehab consult    Precautions /  Restrictions Precautions Precautions: Fall;Back Precaution Booklet Issued: No Precaution Comments: reviewed spinal precautions; watch SpO2 (6L baseline) Required Braces or Orthoses: Spinal Brace Spinal Brace: Thoracolumbosacral orthotic;Applied in supine position (clamshell brace) Restrictions Weight Bearing Restrictions: No       Mobility Bed Mobility Overal bed mobility: Needs Assistance Bed Mobility: Rolling, Sidelying to Sit Rolling: Mod assist, Braydan assist Sidelying to sit: Kaito assist, +2 for physical assistance       General bed mobility comments: instruction on rail use to assist. Patient requiring assistance with BLEs and trunk  as well as scooting hips forward    Transfers Overall transfer level: Needs assistance Equipment used: Ambulation equipment used Transfers: Sit to/from Stand, Bed to chair/wheelchair/BSC Sit to Stand: Minoru assist, +2 physical assistance           General transfer comment: Asiel assist x2 to stand from EOB and from [employer] Transfer via Lift Equipment: Stedy   Balance Overall balance assessment: Needs assistance Sitting-balance support: Single extremity supported, Bilateral upper extremity supported, No upper extremity supported, Feet supported Sitting balance-[name] Scale: Poor Sitting balance - Comments: mod assist for sitting balance on EOB due to posterior leaning, patient using RUE to provide support Postural control: Posterior lean   Standing balance-[name] Scale: Poor Standing balance comment: stood from EOB and Sted pads with min assist to min guard for standing balance                           ADL either performed or assessed with clinical judgement   ADL Overall ADL's : Needs assistance/impaired  General ADL Comments: focused on bed mobility, sitting balance, and transfers    Extremity/Trunk Assessment              Vision       Perception     Praxis       Cognition Arousal/Alertness: Awake/alert Behavior During Therapy: Flat affect Overall Cognitive Status: Difficult to assess                                 General Comments: limited by pain but willing to work through        Exercises      Shoulder Instructions       General Comments      Pertinent Vitals/ Pain       Pain Assessment Pain Assessment: 0-10 Pain Score: 8  Pain Location: back, R leg Pain Descriptors / Indicators: Discomfort, Grimacing, Guarding, Moaning Pain Intervention(s): Limited activity within patient's tolerance, Monitored during session, Premedicated before session, Repositioned  Home Living                                          Prior Functioning/Environment              Frequency  Min 2X/week        Progress Toward Goals  OT Goals(current goals can now be found in the care plan section)  Progress towards OT goals: Progressing toward goals  Acute Rehab OT Goals Patient Stated Goal: get stronger OT Goal Formulation: With patient/family Time For Goal Achievement: 12/09/22 Potential to Achieve Goals: Good ADL Goals Pt Will Perform Grooming: with min assist;sitting;standing;with caregiver independent in assisting Pt Will Transfer to Toilet: with supervision;with min assist;stand pivot transfer;bedside commode;regular height toilet;ambulating Pt Will Perform Toileting - Clothing Manipulation and hygiene: with supervision;with min assist;with caregiver independent in assisting;with adaptive equipment;sit to/from stand;sitting/lateral leans Additional ADL Goal #1: Pt will be supervision bed mobility and Min A TLSO application in preparation for increased participation with ADL's (caregiver/family independent with assisting). Additional ADL Goal #2: Pt will be Mod I seated ADL's at EOB for or more in preparation for increased participation with activities of daily living  Plan Discharge plan remains  appropriate    Co-evaluation    PT/OT/SLP Co-Evaluation/Treatment: Yes Reason for Co-Treatment: For patient/therapist safety;To address functional/ADL transfers   OT goals addressed during session: ADL's and self-care;Strengthening/ROM      AM-PAC OT "6 Clicks" Daily Activity     Outcome Measure   Help from another person eating meals?: A Little Help from another person taking care of personal grooming?: A Lot Help from another person toileting, which includes using toliet, bedpan, or urinal?: Total Help from another person bathing (including washing, rinsing, drying)?: A Lot Help from another person to put on and taking off regular upper body clothing?: A Lot Help from another person to put on and taking off regular lower body clothing?: Total 6 Click Score: 11    End of Session Equipment Utilized During Treatment: Gait belt;Back brace;Oxygen;Other (comment) Antony Salmon)  OT Visit Diagnosis: Other abnormalities of gait and mobility (R26.89);Muscle weakness (generalized) (M62.81);History of falling (Z91.81);Pain Pain - part of body:  (back)   Activity Tolerance Patient limited by pain   Patient Left in chair;with call bell/phone within reach;with chair alarm set;with family/visitor present   Nurse Communication Mobility status;Need for  lift equipment        Time: [phone] OT Time Calculation (min): 30 min  Charges: OT General Charges $OT Visit: 1 Visit OT Treatments $Therapeutic Activity: 8-22 mins  Alfonse Flavors, OTA Acute Rehabilitation Services  Office [phone]   Dewain Penning 11/26/2022, 2:39 PM

## 2022-11-26 NOTE — Progress Notes (Signed)
  NEUROSURGERY PROGRESS NOTE   No issues overnight, no new N/T/W of LE.  EXAM:  BP 132/83 (BP Location: Left Arm)   Pulse 72   Temp 98.4 F (36.9 C) (Oral)   Resp 19   Ht  (1.727 m)   Wt 124.7 kg   SpO2 94%   BMI 41.81 kg/m   Awake, alert Speech fluent, appropriate  CN grossly intact  Moves BLE well  IMPRESSION:  77 y.o. male POD#3 T11-L2 stabilization of T12-L1 fracture, neurologically stable Multiple medical comorbidities  PLAN: - Cont PT/OT, suspect he will need CIR - Can restart Eliquis today   Lisbeth Renshaw, MD [facility]

## 2022-11-26 NOTE — Op Note (Signed)
  NEUROSURGERY OPERATIVE NOTE   PREOP DIAGNOSIS:  Unstable T12-L1 Fracture   POSTOP DIAGNOSIS: Same  PROCEDURE: ORIF T12-L1 Fracture Posterior Instrumented Segmental Stabilization, T11-L2 Using [employer] Pedicle screw/rod Use of intraoperative robotic stereotaxy  SURGEON: Dr. Lisbeth Renshaw, MD  ASSISTANT: None  ANESTHESIA: General Endotracheal  EBL: Minimal  SPECIMENS: None  DRAINS: None  COMPLICATIONS: None immediate  CONDITION: Hemodynamically stable to PACU, intubated  HISTORY: [name] [name] is a 77 y.o. male with multiple medical comorbidities presenting to the hospital after suffering a ground-level fall.  CT scan demonstrated an unstable hyperextension type fracture through the 12 L1 disc space.  This did require operative stabilization.  Patient was on Eliquis for atrial fibrillation and was admitted to the hospital while his anticoagulation was discontinued.  The details of the surgery were reviewed in detail with the patient and his wife. This included expected postop course and recovery and the associated risks of surgery.  After all her questions were answered informed consent was obtained and witnessed.  PROCEDURE IN DETAIL: The patient was brought to the operating room. After induction of general anesthesia, the patient was positioned on the operative table in the prone position. All pressure points were meticulously padded. Skin incision was then marked out and prepped and draped in the usual sterile fashion.  After timeout was conducted, a small stab incision was made over the posterior superior iliac spine.  Schanz pin was introduced and connected to the Mazor robotic system.  Intraoperative CT scan was taken.  An excellent accuracy was noted.  The scan was then used to plan standard trajectory pedicle screws extending from T11-L2.  The robotic arm was then used to mark out the superior and inferior margins of the right-sided and left-sided skin  incisions.  Incision was then infiltrated with local anesthetic with epinephrine.  Incision was then made sharply and carried down through the subcutaneous tissue.  The robotic arm was then used to identify a trajectory for standard pedicle screws bilaterally at T11, T12, L1, and L2.  Fascia was incised at each level, and pilot hole was drilled under navigation with the high-speed drill, tapped, and screws were placed.  Final AP and lateral fluoroscopic images revealed good position of the pedicle screws with the exception of the left L1 pedicle screw which appeared to be somewhat lateral.  I therefore remove the screw, and again utilizing the stereotactic navigation, a new more medially oriented screw was placed.  X-ray then revealed excellent position of the screws.  Fracture appeared to be somewhat reduced.  Rod was then sized with a caliper and bent with a [place] bender in order to conform to the patient's lordotic spine.  The rod was then passed underneath the fascia from superior to inferior within the screw heads.  Setscrews were then placed and final tightened.  Final AP and lateral images revealed good hardware position and good thoracolumbar alignment.  At this point the fascial incisions were closed with interrupted 0 Vicryl stitches.  Deep subcutaneous layer was closed with interrupted 0 Vicryl stitches and the skin was closed with staples.  Bacitracin ointment and sterile dressing was applied.  Schanz pin was removed and the stab incision was closed with staples.   At the end of the case all sponge, needle, and instrument counts were correct. The patient was then transferred to the stretcher, extubated, and taken to the post-anesthesia care unit in stable hemodynamic condition.   Lisbeth Renshaw, MD [facility]

## 2022-11-26 NOTE — Progress Notes (Signed)
Physical Therapy Treatment Patient Details Name: [name] [name] MRN: [id number] DOB: 25-Apr-1946 Today's Date: 11/26/2022   History of Present Illness Pt is a 77 y.o. male who presented 11/18/22 after falling down a few stairs at home. Pt sustained T12-L1 unstable fx. S/p ORIF of T11-L2 4/16. PMH: morbid obesity, OSA, OHS, DM2, HTN, HLD, COPD, severe pulmonary hypertension, chronic diastolic and right-sided CHF, chronic 3 to 5 L oxygen requirement, permanent A-fib on Eliquis, migraine, arthritis, gout    PT Comments    Pt is making good, gradual progress with functional mobility. He is demonstrating improved ability to extend his R knee through full AROM against gravity along with improved ability to flex his L leg and initiate rolling to his R. However, pt still requiring mod-maxA to roll due to pain and difficulty with donning his clamshell back brace. Pt was able to progress to performing transfers to stand using the stedy and maxAx2 today. Once standing, pt only needed min guard-minA for static standing balance. As pt is already demonstrating good progress and improved activity tolerance, feel he remains a good candidate for intensive inpatient rehab. Will continue to follow acutely.      Recommendations for follow up therapy are one component of a multi-disciplinary discharge planning process, led by the attending physician.  Recommendations may be updated based on patient status, additional functional criteria and insurance authorization.  Follow Up Recommendations       Assistance Recommended at Discharge Frequent or constant Supervision/Assistance  Patient can return home with the following Two people to help with walking and/or transfers;Two people to help with bathing/dressing/bathroom;Assistance with cooking/housework;Direct supervision/assist for financial management;Direct supervision/assist for medications management;Assist for transportation;Help with stairs or ramp for entrance    Equipment Recommendations  Other (comment) (TBA)    Recommendations for Other Services       Precautions / Restrictions Precautions Precautions: Fall;Back Precaution Booklet Issued: No Precaution Comments: reviewed spinal precautions; watch SpO2 (6L baseline) Required Braces or Orthoses: Spinal Brace Spinal Brace: Thoracolumbosacral orthotic;Applied in supine position (clamshell brace) Restrictions Weight Bearing Restrictions: No     Mobility  Bed Mobility Overal bed mobility: Needs Assistance Bed Mobility: Rolling, Sidelying to Sit Rolling: Mod assist, [place]or[place] assist Sidelying to sit: Cleaven assist, +2 for physical assistance       General bed mobility comments: Improved ability to flex L leg without assistance and initiate reach of L UE to R bed rail to roll to the R, modA. MaxA still to roll to the L. MaxAx2 to bring bil legs off EOB and ascend trunk with pt tendening to lean to the R and posteriorly upon sitting up R EOB.    Transfers Overall transfer level: Needs assistance Equipment used: Ambulation equipment used Transfers: Sit to/from Stand, Bed to chair/wheelchair/BSC Sit to Stand: Younes assist, +2 physical assistance           General transfer comment: Hyde assist x2 to stand from EOB and from Stedy pads, but min guard-minA for static standing once up in the stedy Transfer via Lift Equipment: Stedy  Ambulation/Gait               General Gait Details: unable   Stairs             Wheelchair Mobility    Modified Rankin (Stroke Patients Only)       Balance Overall balance assessment: Needs assistance Sitting-balance support: Single extremity supported, Bilateral upper extremity supported, No upper extremity supported, Feet supported Sitting balance-[name] Scale: Poor Sitting  balance - Comments: mod assist for sitting balance on EOB due to posterior leaning, patient using RUE to provide support Postural control: Posterior lean Standing balance  support: Bilateral upper extremity supported, During functional activity, Reliant on assistive device for balance Standing balance-[name] Scale: Poor Standing balance comment: stood from EOB and Stedy pads with min assist to min guard for static standing balance                            Cognition Arousal/Alertness: Awake/alert Behavior During Therapy: Flat affect Overall Cognitive Status: Difficult to assess                                 General Comments: limited by pain but willing to work through        Exercises      General Comments General comments (skin integrity, edema, etc.): encouraged LAQs sitting in chair end of session      Pertinent Vitals/Pain Pain Assessment Pain Assessment: 0-10 Pain Score: 8  Pain Location: back, R leg Pain Descriptors / Indicators: Discomfort, Grimacing, Guarding, Moaning Pain Intervention(s): Limited activity within patient's tolerance, Monitored during session, Repositioned, Premedicated before session    Home Living                          Prior Function            PT Goals (current goals can now be found in the care plan section) Acute Rehab PT Goals Patient Stated Goal: to reduce pain PT Goal Formulation: With patient/family Time For Goal Achievement: 12/09/22 Potential to Achieve Goals: Good Progress towards PT goals: Progressing toward goals    Frequency    Min 5X/week      PT Plan Current plan remains appropriate    Co-evaluation PT/OT/SLP Co-Evaluation/Treatment: Yes Reason for Co-Treatment: For patient/therapist safety;To address functional/ADL transfers PT goals addressed during session: Mobility/safety with mobility;Balance;Proper use of DME OT goals addressed during session: ADL's and self-care;Strengthening/ROM      AM-PAC PT "6 Clicks" Mobility   Outcome Measure  Help needed turning from your back to your side while in a flat bed without using bedrails?: A  Lot Help needed moving from lying on your back to sitting on the side of a flat bed without using bedrails?: Total Help needed moving to and from a bed to a chair (including a wheelchair)?: Total Help needed standing up from a chair using your arms (e.g., wheelchair or bedside chair)?: Total Help needed to walk in hospital room?: Total Help needed climbing 3-5 steps with a railing? : Total 6 Click Score: 7    End of Session Equipment Utilized During Treatment: Gait belt;Oxygen;Back brace Activity Tolerance: Patient tolerated treatment well Patient left: in chair;with call bell/phone within reach;with chair alarm set;with family/visitor present Nurse Communication: Mobility status;Need for lift equipment PT Visit Diagnosis: Unsteadiness on feet (R26.81);Muscle weakness (generalized) (M62.81);Difficulty in walking, not elsewhere classified (R26.2);Pain Pain - Right/Left: Right Pain - part of body: Leg (and back)     Time: 1610-9604 PT Time Calculation (min) (ACUTE ONLY): 33 min  Charges:  $Therapeutic Activity: 8-22 mins                     Raymond Gurney, PT, DPT Acute Rehabilitation Services  Office: [phone]    Jewel Baize 11/26/2022, 2:52 PM

## 2022-11-26 NOTE — TOC Progression Note (Signed)
Transition of Care [facility]) - Progression Note    Patient Details  Name: [name] [name] MRN: [id number] Date of Birth: 1946-03-16  [facility]) CM/SW Contact  Kermit Balo, RN Phone Number: 11/26/2022, 2:56 PM  Clinical Narrative:    CIR to start auth for rehab admission. TOC following.   Expected Discharge Plan: IP Rehab Facility Barriers to Discharge: Continued Medical Work up  Expected Discharge Plan and Services   Discharge Planning Services: CM Consult   Living arrangements for the past 2 months: Single Family Home                                       Social Determinants of Health (SDOH) Interventions SDOH Screenings   Food Insecurity: No Food Insecurity (11/25/2022)  Housing: Low Risk  (11/25/2022)  Transportation Needs: No Transportation Needs (11/25/2022)  Utilities: Not At Risk (11/25/2022)  Depression (PHQ2-9): Low Risk  (10/08/2022)  Tobacco Use: Medium Risk (11/25/2022)    Readmission Risk Interventions     No data to display

## 2022-11-27 DIAGNOSIS — S32018G Other fracture of first lumbar vertebra, subsequent encounter for fracture with delayed healing: Secondary | ICD-10-CM | POA: Diagnosis not present

## 2022-11-27 LAB — GLUCOSE, CAPILLARY
Glucose-Capillary: 120 mg/dL — ABNORMAL HIGH (ref 70–99)
Glucose-Capillary: 144 mg/dL — ABNORMAL HIGH (ref 70–99)
Glucose-Capillary: 163 mg/dL — ABNORMAL HIGH (ref 70–99)
Glucose-Capillary: 147 mg/dL — ABNORMAL HIGH (ref 70–99)

## 2022-11-27 LAB — CBC WITH DIFFERENTIAL/PLATELET
Abs Immature Granulocytes: 0.04 10*3/uL (ref 0.00–0.07)
Basophils Absolute: 0 10*3/uL (ref 0.0–0.1)
Basophils Relative: 0 %
Eosinophils Absolute: 0.2 10*3/uL (ref 0.0–0.5)
Eosinophils Relative: 2 %
HCT: 43.4 % (ref 39.0–52.0)
Hemoglobin: 13 g/dL (ref 13.0–17.0)
Immature Granulocytes: 0 %
Lymphocytes Relative: 18 %
Lymphs Abs: 1.7 10*3/uL (ref 0.7–4.0)
MCH: 28.8 pg (ref 26.0–34.0)
MCHC: 30 g/dL (ref 30.0–36.0)
MCV: 96 fL (ref 80.0–100.0)
Monocytes Absolute: 1 10*3/uL (ref 0.1–1.0)
Monocytes Relative: 10 %
Neutro Abs: 6.7 10*3/uL (ref 1.7–7.7)
Neutrophils Relative %: 70 %
Platelets: 263 10*3/uL (ref 150–400)
RBC: 4.52 MIL/uL (ref 4.22–5.81)
RDW: 15.8 % — ABNORMAL HIGH (ref 11.5–15.5)
WBC: 9.7 10*3/uL (ref 4.0–10.5)
nRBC: 0 % (ref 0.0–0.2)

## 2022-11-27 LAB — BASIC METABOLIC PANEL
Anion gap: 8 (ref 5–15)
BUN: 35 mg/dL — ABNORMAL HIGH (ref 8–23)
CO2: 39 mmol/L — ABNORMAL HIGH (ref 22–32)
Calcium: 9.5 mg/dL (ref 8.9–10.3)
Chloride: 109 mmol/L (ref 98–111)
Creatinine, Ser: 1.39 mg/dL — ABNORMAL HIGH (ref 0.61–1.24)
GFR, Estimated: 53 mL/min — ABNORMAL LOW (ref >60)
Glucose, Bld: 167 mg/dL — ABNORMAL HIGH (ref 70–99)
Potassium: 3.6 mmol/L (ref 3.5–5.1)
Sodium: 156 mmol/L — ABNORMAL HIGH (ref 135–145)

## 2022-11-27 MED ORDER — MAGNESIUM HYDROXIDE 400 MG/5ML PO SUSP
30.0000 mL | Freq: Two times a day (BID) | ORAL | Status: AC
  Administered 2022-11-27 (×2): 30 mL via ORAL
  Filled 2022-11-27 (×2): qty 30

## 2022-11-27 MED ORDER — DEXTROSE 5 % IV SOLN: INTRAVENOUS | Status: AC

## 2022-11-27 NOTE — Progress Notes (Signed)
  NEUROSURGERY PROGRESS NOTE   No issues overnight, wife at bedside reports some confusion. No new N/T/W of LE. Says he can't get comfortable.  EXAM:  BP 131/76 (BP Location: Right Arm)   Pulse 87   Temp 98 F (36.7 C) (Oral)   Resp 18   Ht  (1.727 m)   Wt 124.7 kg   SpO2 97%   BMI 41.81 kg/m   Awake, alert Speech fluent, appropriate  CN grossly intact  Moves BLE  IMPRESSION:  77 y.o. male POD#4 T11-L2 stabilization of T12-L1 fracture, neurologically stable Multiple medical comorbidities  PLAN: - Cont PT/OT, suspect he will need CIR - Eliquis restarted yesterday, no change in exam   Lisbeth Renshaw, MD [facility]

## 2022-11-27 NOTE — Progress Notes (Signed)
PROGRESS NOTE  [name] [name]  DOB: January 08, 1946  PCP: Corwin Levins, MD ZOX:[id number]  DOA: 11/18/2022  LOS: 9 days  Hospital Day: 10  Brief narrative: [name] [name] is a 77 y.o. male with PMH significant for morbid obesity, OSA, OHS, DM2, HTN, HLD, COPD, severe pulmonary hypertension, chronic diastolic and right-sided CHF, chronic 3 to 5 L oxygen requirement, permanent A-fib on Eliquis, migraine, arthritis, gout. 4/11, patient presented to the ED after a mechanical fall when he missed a step and fell onto his back with resultant severe back pain.  In the ED, CT spine noted T12/L1 level widening of the anterior disc space and mildly displaced fracture of the posterior superior endplate of L1. Admitted to [facility] Neurosurgery recommended surgical stabilization 4/16, patient underwent ORIF of the fracture, T11-L2 instrumented stabilization.  Subjective: Patient was seen and examined this morning Propped up in bed.  Not in distress.  Remains on IV fluid for last 48 hours.  Breathing status has not worsened.  Creatinine improving but sodium level not. No other issues.  Daughter at bedside.  Assessment and plan: T12-L1 endplate spine fracture d/t mechanical fall S/p ORIF and instrumentation T11-L2 Currently in pain management with Tylenol as needed, Dilaudid as needed, oxycodone as needed With neurosurgery clearance, Eliquis was resumed on 4/19.   PT/OT eval obtained. CIR recommended.  Chronic atrial fibrillation  PTA on Cardizem 180 mg daily, metoprolol 25 mg twice daily. Currently continued on both. Anticoagulation plan as above.     Chronic diastolic CHF Severe pulmonary hypertension with cor pulmonale PTA on metoprolol 25 mg twice daily, torsemide 60-80 mg twice daily It seems patient was over diuresed and hence his creatinine and sodium level are running elevated.  Currently diuretics on hold.  Continue metoprolol.  AKI on CKD 3a Contraction alkalosis Hypernatremia Baseline creatinine  less than 1.2.   Creatinine, sodium and bicarbonate level were all trending up.   Diuretics on hold.  Sodium level from 1 53-1 36 today.  Switch IV fluid to D5 at 75 mill per hour today.  Recent Labs    10/08/22 1611 11/18/22 1228 11/20/22 0325 11/21/22 0310 11/22/22 0342 11/23/22 0604 11/24/22 0243 11/25/22 0316 11/26/22 0438 11/27/22 0400  BUN 28* 33* 44* 55* 47* 35*  CREATININE 1.19 1.26* 1.34* 1.38* 1.28* 1.30* 1.48* 1.60* 1.54* 1.39*  CO2 34* 30 33* 32 31 37* 35* 41* 36* 39*    Recent Labs  Lab 11/21/22 0310 11/22/22 0342 11/23/22 0604 11/24/22 0243 11/25/22 0316 11/26/22 0438 11/27/22 0400  NA 137 142 148* 148* 156* 153* 156*    Morbid Obesity  Body mass index is 41.81 kg/m. Patient has been advised to make an attempt to improve diet and exercise patterns to aid in weight loss.   OHS/Sleep apnea Chronic hypoxic respiratory failure Continue usual supportive oxygen at baseline of 4-6LPM as confirmed by daughter - utilizing Venturi facemask as needed as patient often mouth breathes Encourage nightly BiPAP.  Did not tolerate last night.  Type 2 diabetes mellitus A1c 7 on March 2024 PTA on metformin 1000 mg daily, glipizide 10 mg daily Currently both of them are on hold.  Continue sliding scale insulin with Accu-Cheks.  Once appetite improves, may need long-acting insulin or reinitiation of oral meds. Recent Labs  Lab 11/26/22 1133 11/26/22 1606 11/26/22 2124 11/27/22 0622 11/27/22 1109  GLUCAP 154* 171* 142* 120* 144*     HLD Lipitor  Gout Allopurinol  Constipation Continue Senokot scheduled and  as needed Dulcolax.  No bowel movement charted in several days.  Currently on scheduled MiraLAX twice daily.  Also given 1 dose of milk of magnesia today.  Obtain KUB tomorrow if no bowel movement today.   Impaired mobility  PT/OT eval obtained.  CIR recommended  Goals of care   Code Status: Full Code     DVT prophylaxis:  SCDs Start:  11/18/22 1455 apixaban (ELIQUIS) tablet 5 mg   Antimicrobials: None Fluid: None Consultants: Neurosurgery Family Communication: Daughter at bedside  Status: Inpatient Level of care:  Progressive   Needs to continue in-hospital care:  Abnormal electrolytes, fluid balance impaired.  Needs IV hydration. Patient from: Home Anticipated d/c to: Pending clinical course.  Being considered for CIR.   Diet:  Diet Order             Diet regular Room service appropriate? Yes; Fluid consistency: Thin  Diet effective now                   Scheduled Meds:  acetaminophen  1,000 mg Oral TID   allopurinol  100 mg Oral Daily   apixaban  5 mg Oral BID   atorvastatin  20 mg Oral Daily   diltiazem  180 mg Oral Daily   insulin aspart  0-15 Units Subcutaneous TID WC   insulin aspart  0-5 Units Subcutaneous QHS   magnesium hydroxide  30 mL Oral BID   metoprolol tartrate  25 mg Oral BID   oxyCODONE  5 mg Oral Q6H   polyethylene glycol  17 g Oral BID   senna-docusate  1 tablet Oral BID    PRN meds: albuterol, bisacodyl, HYDROmorphone (DILAUDID) injection, phenol   Infusions:   dextrose 75 mL/hr at 11/27/22 1044    Antimicrobials: Anti-infectives (From admission, onward)    Start     Dose/Rate Route Frequency Ordered Stop   11/23/22 0600  ceFAZolin (ANCEF) IVPB 3g/100 mL premix        3 g 200 mL/hr over 30 Minutes Intravenous On call to O.R. 11/22/22 0939 11/23/22 1650       Nutritional status:  Body mass index is 41.81 kg/m.          Objective: Vitals:   11/27/22 0756 11/27/22 1109  BP: 131/76 118/72  Pulse: 87 99  Resp: 18 18  Temp: 98 F (36.7 C) 98.5 F (36.9 C)  SpO2: 97% 96%    Intake/Output Summary (Last 24 hours) at 11/27/2022 1512 Last data filed at 11/27/2022 1300 Gross per 24 hour  Intake 934.19 ml  Output 2100 ml  Net -1165.81 ml    Filed Weights   11/18/22 1104  Weight: 124.7 kg   Weight change:  Body mass index is 41.81 kg/m.    Physical Exam: General exam: Pleasant, elderly Caucasian male.  Controlled.  On 6lpm O2 Skin: No rashes, lesions or ulcers. HEENT: Atraumatic, normocephalic, no obvious bleeding Lungs: Clear to auscultation bilaterally.. CVS: Regular rate and rhythm, no murmur soft GI/Abd: Nontender, nondistended, bowel sound present CNS: Alert, awake, slow to respond but oriented Psychiatry: Mood appropriate. Extremities: No pedal edema, no calf tenderness  Data Review: I have personally reviewed the laboratory data and studies available.  F/u labs ordered Unresulted Labs (From admission, onward)    None       Total time spent in review of labs and imaging, patient evaluation, formulation of plan, documentation and communication with family: 45 minutes  Signed, Lorin Glass, MD [facility] 11/27/2022

## 2022-11-28 DIAGNOSIS — S32018G Other fracture of first lumbar vertebra, subsequent encounter for fracture with delayed healing: Secondary | ICD-10-CM | POA: Diagnosis not present

## 2022-11-28 LAB — BASIC METABOLIC PANEL
Anion gap: 11 (ref 5–15)
BUN: 25 mg/dL — ABNORMAL HIGH (ref 8–23)
CO2: 29 mmol/L (ref 22–32)
Calcium: 9.8 mg/dL (ref 8.9–10.3)
Chloride: 109 mmol/L (ref 98–111)
Creatinine, Ser: 1.28 mg/dL — ABNORMAL HIGH (ref 0.61–1.24)
GFR, Estimated: 58 mL/min — ABNORMAL LOW (ref >60)
Glucose, Bld: 168 mg/dL — ABNORMAL HIGH (ref 70–99)
Potassium: 4.8 mmol/L (ref 3.5–5.1)
Sodium: 149 mmol/L — ABNORMAL HIGH (ref 135–145)

## 2022-11-28 LAB — GLUCOSE, CAPILLARY
Glucose-Capillary: 168 mg/dL — ABNORMAL HIGH (ref 70–99)
Glucose-Capillary: 157 mg/dL — ABNORMAL HIGH (ref 70–99)
Glucose-Capillary: 124 mg/dL — ABNORMAL HIGH (ref 70–99)
Glucose-Capillary: 122 mg/dL — ABNORMAL HIGH (ref 70–99)

## 2022-11-28 MED ORDER — DEXTROSE 5 % IV SOLN: INTRAVENOUS | Status: DC

## 2022-11-28 MED ORDER — POLYETHYLENE GLYCOL 3350 17 G PO PACK: 17.0000 g | PACK | Freq: Every day | ORAL | Status: DC | PRN

## 2022-11-28 NOTE — Progress Notes (Signed)
No new events or problems.  Patient still with severe back pain.  No radicular pain numbness or weakness.  Patient mobilizing very slowly.  Afebrile with vital signs stable.  Good urine output.  He is awake and alert.  He is oriented and appropriate.  Motor and sensory function are stable.  Wounds clean and dry.  Overall progressing reasonably well.  Continue efforts at mobilization and pain control.

## 2022-11-28 NOTE — Progress Notes (Signed)
Pt declined CPAP for tonight. 

## 2022-11-28 NOTE — Progress Notes (Signed)
PROGRESS NOTE  [name] [name]  DOB: 03/12/1946  PCP: Corwin Levins, MD ZOX:[id number]  DOA: 11/18/2022  LOS: 10 days  Hospital Day: 11  Brief narrative: [name] [name] is a 77 y.o. male with PMH significant for morbid obesity, OSA, OHS, DM2, HTN, HLD, COPD, severe pulmonary hypertension, chronic diastolic and right-sided CHF, chronic 3 to 5 L oxygen requirement, permanent A-fib on Eliquis, migraine, arthritis, gout. 4/11, patient presented to the ED after a mechanical fall when he missed a step and fell onto his back with resultant severe back pain.  In the ED, CT spine noted T12/L1 level widening of the anterior disc space and mildly displaced fracture of the posterior superior endplate of L1. Admitted to [facility] Neurosurgery recommended surgical stabilization 4/16, patient underwent ORIF of the fracture, T11-L2 instrumented stabilization.  Subjective: Patient was seen and examined this morning Lying on bed.  Respite status remains stable. On IV fluid, serum creatinine and sodium level improving. Had a good bowel movement yesterday.  Assessment and plan: T12-L1 endplate spine fracture d/t mechanical fall S/p ORIF and instrumentation T11-L2 Currently in pain management with Tylenol as needed, Dilaudid as needed, oxycodone as needed With neurosurgery clearance, Eliquis was resumed on 4/19.   PT/OT eval obtained. CIR recommended.  Chronic atrial fibrillation  PTA on Cardizem 180 mg daily, metoprolol 25 mg twice daily. Currently continued on both. Anticoagulation plan as above.     Chronic diastolic CHF Severe pulmonary hypertension with cor pulmonale PTA on metoprolol 25 mg twice daily, torsemide 60-80 mg twice daily It seems patient was over diuresed and hence his creatinine and sodium level are running elevated.  Currently diuretics on hold.  Continue metoprolol.  AKI on CKD 3a Contraction alkalosis Hypernatremia Baseline creatinine less than 1.2.   Creatinine, sodium and bicarbonate  level were all trending up.   Diuretics on hold.  Given D5 at 75 mill for last 24 hours.  Sodium level and creatinine level improving as below.  Urine still remains dark.  I would continue fluid for another 24 hours.  Recent Labs    11/18/22 1228 11/20/22 0325 11/21/22 0310 11/22/22 0342 11/23/22 0604 11/24/22 0243 11/25/22 0316 11/26/22 0438 11/27/22 0400 11/28/22 0937  BUN 28* 33* 44* 55* 47* 35* 25*  CREATININE 1.26* 1.34* 1.38* 1.28* 1.30* 1.48* 1.60* 1.54* 1.39* 1.28*  CO2 30 33* 32 31 37* 35* 41* 36* 39* 29   Recent Labs  Lab 11/22/22 0342 11/23/22 0604 11/24/22 0243 11/25/22 0316 11/26/22 0438 11/27/22 0400 11/28/22 0937  NA 142 148* 148* 156* 153* 156* 149*   Morbid Obesity  Body mass index is 41.81 kg/m. Patient has been advised to make an attempt to improve diet and exercise patterns to aid in weight loss.   OHS/Sleep apnea Chronic hypoxic respiratory failure Continue usual supportive oxygen at baseline of 4-6LPM as confirmed by daughter - utilizing Venturi facemask as needed as patient often mouth breathes Encourage nightly BiPAP.  Did not tolerate last night.  Type 2 diabetes mellitus A1c 7 on March 2024 PTA on metformin 1000 mg daily, glipizide 10 mg daily Currently both of them are on hold.  Continue sliding scale insulin with Accu-Cheks.  Once appetite improves, may need long-acting insulin or reinitiation of oral meds. Recent Labs  Lab 11/27/22 1629 11/27/22 2222 11/28/22 0615 11/28/22 0815 11/28/22 1157  GLUCAP 163* 147* 168* 157* 124*    HLD Lipitor  Gout Allopurinol  Constipation Continue Senokot scheduled and as needed MiraLAX.  Impaired mobility  PT/OT eval obtained.  CIR recommended  Goals of care   Code Status: Full Code     DVT prophylaxis:  SCDs Start: 11/18/22 1455 apixaban (ELIQUIS) tablet 5 mg   Antimicrobials: None Fluid: None Consultants: Neurosurgery Family Communication: Daughter at  bedside  Status: Inpatient Level of care:  Progressive   Needs to continue in-hospital care:  Abnormal electrolytes, fluid balance impaired.  Needs IV hydration.  Patient from: Home Anticipated d/c to: Pending clinical course.  Being considered for CIR.   Diet:  Diet Order             Diet regular Room service appropriate? Yes; Fluid consistency: Thin  Diet effective now                   Scheduled Meds:  acetaminophen  1,000 mg Oral TID   allopurinol  100 mg Oral Daily   apixaban  5 mg Oral BID   atorvastatin  20 mg Oral Daily   diltiazem  180 mg Oral Daily   insulin aspart  0-15 Units Subcutaneous TID WC   insulin aspart  0-5 Units Subcutaneous QHS   metoprolol tartrate  25 mg Oral BID   oxyCODONE  5 mg Oral Q6H   senna-docusate  1 tablet Oral BID    PRN meds: albuterol, HYDROmorphone (DILAUDID) injection, phenol, polyethylene glycol   Infusions:   dextrose      Antimicrobials: Anti-infectives (From admission, onward)    Start     Dose/Rate Route Frequency Ordered Stop   11/23/22 0600  ceFAZolin (ANCEF) IVPB 3g/100 mL premix        3 g 200 mL/hr over 30 Minutes Intravenous On call to O.R. 11/22/22 0939 11/23/22 1650       Nutritional status:  Body mass index is 41.81 kg/m.          Objective: Vitals:   11/28/22 0813 11/28/22 1159  BP: 112/67 111/75  Pulse: 98 87  Resp: 20 20  Temp: 98.5 F (36.9 C) 98 F (36.7 C)  SpO2: 97% 98%    Intake/Output Summary (Last 24 hours) at 11/28/2022 1445 Last data filed at 11/28/2022 1231 Gross per 24 hour  Intake 1795.61 ml  Output 1800 ml  Net -4.39 ml   Filed Weights   11/18/22 1104  Weight: 124.7 kg   Weight change:  Body mass index is 41.81 kg/m.   Physical Exam: General exam: Pleasant, elderly Caucasian male.  Controlled.  On 6lpm O2 Skin: No rashes, lesions or ulcers. HEENT: Atraumatic, normocephalic, no obvious bleeding Lungs: Clear to auscultation bilaterally.. CVS: Regular rate  and rhythm, no murmur soft GI/Abd: Nontender, nondistended, bowel sound present CNS: Alert, awake, slow to respond but oriented Psychiatry: Mood appropriate. Extremities: No pedal edema, no calf tenderness  Data Review: I have personally reviewed the laboratory data and studies available.  F/u labs ordered Unresulted Labs (From admission, onward)     Start     Ordered   11/29/22 0500  Basic metabolic panel  Daily,   R     Question:  Specimen collection method  Answer:  Lab=Lab collect   11/28/22 0814            Total time spent in review of labs and imaging, patient evaluation, formulation of plan, documentation and communication with family: 45 minutes  Signed, Lorin Glass, MD [facility] 11/28/2022

## 2022-11-29 ENCOUNTER — Inpatient Hospital Stay (HOSPITAL_COMMUNITY): Payer: Medicare Other

## 2022-11-29 DIAGNOSIS — R9431 Abnormal electrocardiogram [ECG] [EKG]: Secondary | ICD-10-CM

## 2022-11-29 DIAGNOSIS — S32018G Other fracture of first lumbar vertebra, subsequent encounter for fracture with delayed healing: Secondary | ICD-10-CM | POA: Diagnosis not present

## 2022-11-29 LAB — BASIC METABOLIC PANEL
Anion gap: 10 (ref 5–15)
BUN: 21 mg/dL (ref 8–23)
CO2: 33 mmol/L — ABNORMAL HIGH (ref 22–32)
Calcium: 9.7 mg/dL (ref 8.9–10.3)
Chloride: 102 mmol/L (ref 98–111)
Creatinine, Ser: 1.22 mg/dL (ref 0.61–1.24)
GFR, Estimated: >60 mL/min (ref >60)
Glucose, Bld: 164 mg/dL — ABNORMAL HIGH (ref 70–99)
Potassium: 3.4 mmol/L — ABNORMAL LOW (ref 3.5–5.1)
Sodium: 145 mmol/L (ref 135–145)

## 2022-11-29 LAB — ECHOCARDIOGRAM COMPLETE
Area-P 1/2: 3.68 cm2
Height: 68 in
S' Lateral: 3.1 cm
Weight: 4400 oz

## 2022-11-29 LAB — GLUCOSE, CAPILLARY
Glucose-Capillary: 131 mg/dL — ABNORMAL HIGH (ref 70–99)
Glucose-Capillary: 133 mg/dL — ABNORMAL HIGH (ref 70–99)
Glucose-Capillary: 139 mg/dL — ABNORMAL HIGH (ref 70–99)
Glucose-Capillary: 149 mg/dL — ABNORMAL HIGH (ref 70–99)
Glucose-Capillary: 135 mg/dL — ABNORMAL HIGH (ref 70–99)
Glucose-Capillary: 129 mg/dL — ABNORMAL HIGH (ref 70–99)

## 2022-11-29 MED ORDER — PERFLUTREN LIPID MICROSPHERE
1.0000 mL | INTRAVENOUS | Status: AC | PRN
  Administered 2022-11-29: 6 mL via INTRAVENOUS

## 2022-11-29 NOTE — Progress Notes (Signed)
  Echocardiogram 2D Echocardiogram has been performed.  [name] [name] 11/29/2022, 4:09 PM

## 2022-11-29 NOTE — Progress Notes (Signed)
Occupational Therapy Treatment Patient Details Name: [name] [name] MRN: [id number] DOB: Oct 03, 1945 Today's Date: 11/29/2022   History of present illness Pt is a 77 y.o. male who presented 11/18/22 after falling down a few stairs at home. Pt sustained T12-L1 unstable fx. S/p ORIF of T11-L2 4/16. PMH: morbid obesity, OSA, OHS, DM2, HTN, HLD, COPD, severe pulmonary hypertension, chronic diastolic and right-sided CHF, chronic 3 to 5 L oxygen requirement, permanent A-fib on Eliquis, migraine, arthritis, gout   OT comments  Patient performed multiple rolling in bed to address peri care cleaning, pad change and donning back brace. Patient requiring assistance with sitting balance initially but progressed to min guard with increased time. Patient demonstrated gain with sit to stand with standing to RW with +2 min/mod assist from mod assist +2 with stedy and able to ambulate 3 feet to transfer to recliner with chair placed behind patient. Patient will benefit from intensive inpatient follow up therapy, >3 hours/day to address bed mobility, transfers, and self care. Acute OT to continue to follow.     Recommendations for follow up therapy are one component of a multi-disciplinary discharge planning process, led by the attending physician.  Recommendations may be updated based on patient status, additional functional criteria and insurance authorization.    Assistance Recommended at Discharge PRN  Patient can return home with the following  Two people to help with walking and/or transfers;Two people to help with bathing/dressing/bathroom;Assist for transportation;Help with stairs or ramp for entrance;Assistance with cooking/housework   Equipment Recommendations  Other (comment) (defer to next venue)    Recommendations for Other Services Rehab consult    Precautions / Restrictions Precautions Precautions: Fall;Back Precaution Booklet Issued: No Precaution Comments: reviewed spinal precautions; watch SpO2 (6L  baseline) Required Braces or Orthoses: Spinal Brace Spinal Brace: Thoracolumbosacral orthotic;Applied in supine position (clamshell brace) Restrictions Weight Bearing Restrictions: No       Mobility Bed Mobility Overal bed mobility: Needs Assistance Bed Mobility: Rolling, Sidelying to Sit Rolling: Mod assist Sidelying to sit: [place] assist, +2 for physical assistance       General bed mobility comments: frequent cues to participate, frequent rest breaks due to complaints of pain. Patient performed multiple rolling in bed to donn brace and for peri care cleaning and pad change.    Transfers Overall transfer level: Needs assistance Equipment used: Rolling walker (2 wheels) Transfers: Sit to/from Stand Sit to Stand: +2 physical assistance, Mod assist           General transfer comment: ModA + 2 to stand from edge of bed x 2, cues for hand placement, assist to power up and steady     Balance Overall balance assessment: Needs assistance Sitting-balance support: Bilateral upper extremity supported, Feet supported Sitting balance-[name] Scale: Poor Sitting balance - Comments: pt initially requiring minA due to retropulsion, progressing to min guard assist Postural control: Posterior lean Standing balance support: Bilateral upper extremity supported, During functional activity, Reliant on assistive device for balance Standing balance-[name] Scale: Poor Standing balance comment: reliant on BUE support                           ADL either performed or assessed with clinical judgement   ADL Overall ADL's : Needs assistance/impaired             Lower Body Bathing: Total assistance;+2 for physical assistance;+2 for safety/equipment;Bed level Lower Body Bathing Details (indicate cue type and reason): rolling side to side to provided  cleaning following bowel incontinence in bed                       General ADL Comments: Patient unaware of bowel incontinence,  limited by pain with participating with self care    Extremity/Trunk Assessment              Vision       Perception     Praxis      Cognition Arousal/Alertness: Awake/alert Behavior During Therapy: Flat affect Overall Cognitive Status: Difficult to assess                                 General Comments: limited by pain        Exercises      Shoulder Instructions       General Comments      Pertinent Vitals/ Pain       Pain Assessment Pain Assessment: Faces Faces Pain Scale: Hurts whole lot Pain Location: back Pain Descriptors / Indicators: Discomfort, Grimacing, Guarding Pain Intervention(s): Premedicated before session, Repositioned, Monitored during session  Home Living                                          Prior Functioning/Environment              Frequency  Min 2X/week        Progress Toward Goals  OT Goals(current goals can now be found in the care plan section)  Progress towards OT goals: Progressing toward goals  Acute Rehab OT Goals Patient Stated Goal: less pain OT Goal Formulation: With patient/family Time For Goal Achievement: 12/09/22 Potential to Achieve Goals: Good ADL Goals Pt Will Perform Grooming: with min assist;sitting;standing;with caregiver independent in assisting Pt Will Transfer to Toilet: with supervision;with min assist;stand pivot transfer;bedside commode;regular height toilet;ambulating Pt Will Perform Toileting - Clothing Manipulation and hygiene: with supervision;with min assist;with caregiver independent in assisting;with adaptive equipment;sit to/from stand;sitting/lateral leans Additional ADL Goal #1: Pt will be supervision bed mobility and Min A TLSO application in preparation for increased participation with ADL's (caregiver/family independent with assisting). Additional ADL Goal #2: Pt will be Mod I seated ADL's at EOB for or more in preparation for increased  participation with activities of daily living  Plan Discharge plan remains appropriate    Co-evaluation    PT/OT/SLP Co-Evaluation/Treatment: Yes Reason for Co-Treatment: For patient/therapist safety;To address functional/ADL transfers PT goals addressed during session: Mobility/safety with mobility;Balance;Proper use of DME OT goals addressed during session: ADL's and self-care;Strengthening/ROM      AM-PAC OT "6 Clicks" Daily Activity     Outcome Measure   Help from another person eating meals?: A Little Help from another person taking care of personal grooming?: A Lot Help from another person toileting, which includes using toliet, bedpan, or urinal?: Total Help from another person bathing (including washing, rinsing, drying)?: A Lot Help from another person to put on and taking off regular upper body clothing?: A Lot Help from another person to put on and taking off regular lower body clothing?: Total 6 Click Score: 11    End of Session Equipment Utilized During Treatment: Gait belt;Rolling walker (2 wheels);Oxygen  OT Visit Diagnosis: Other abnormalities of gait and mobility (R26.89);Muscle weakness (generalized) (M62.81);History of falling (Z91.81);Pain Pain - part of body:  (  back)   Activity Tolerance Patient limited by pain   Patient Left in chair;with call bell/phone within reach;with chair alarm set;with family/visitor present   Nurse Communication Mobility status;Need for lift equipment        Time: 0822-0900 OT Time Calculation (min): 38 min  Charges: OT General Charges $OT Visit: 1 Visit OT Treatments $Self Care/Home Management : 8-22 mins  Alfonse Flavors, OTA Acute Rehabilitation Services  Office [phone]   Dewain Penning 11/29/2022, 12:00 PM

## 2022-11-29 NOTE — Progress Notes (Signed)
Inpatient Rehab Admissions Coordinator:   Awaiting determination from [facility] Medicare for CIR prior auth request.  Will continue to follow.   Estill Dooms, PT, DPT Admissions Coordinator [phone] 11/29/22  8:48 AM

## 2022-11-29 NOTE — Progress Notes (Signed)
PROGRESS NOTE  [name] [name]  DOB: 02-Dec-1945  PCP: Corwin Levins, MD ZOX:[id number]  DOA: 11/18/2022  LOS: 11 days  Hospital Day: 12  Brief narrative: [name] [name] is a 77 y.o. male with PMH significant for morbid obesity, OSA, OHS, DM2, HTN, HLD, COPD, severe pulmonary hypertension, chronic diastolic and right-sided CHF, chronic 3 to 5 L oxygen requirement, permanent A-fib on Eliquis, migraine, arthritis, gout. 4/11, patient presented to the ED after a mechanical fall when he missed a step and fell onto his back with resultant severe back pain.  In the ED, CT spine noted T12/L1 level widening of the anterior disc space and mildly displaced fracture of the posterior superior endplate of L1. Admitted to [facility] Neurosurgery recommended surgical stabilization 4/16, patient underwent ORIF of the fracture, T11-L2 instrumented stabilization.  Subjective: Patient was seen and examined this morning Lying down in bed.  Remains on 6 L oxygen by nasal cannula. Urine color improved.  Labs with creatinine and sodium level improved. IV fluid stopped. Daughter at bedside. Insurance denied CIR. Peer to peer done. But denial was upheld.  Assessment and plan: T12-L1 endplate spine fracture d/t mechanical fall S/p ORIF and instrumentation T11-L2 Currently in pain management with Tylenol as needed, Dilaudid as needed, oxycodone as needed With neurosurgery clearance, Eliquis was resumed on 4/19.   PT/OT eval obtained. CIR recommended but denied by insurance  Chronic atrial fibrillation  PTA on Cardizem 180 mg daily, metoprolol 25 mg twice daily. Currently continued on both. Anticoagulation plan as above.     Chronic diastolic CHF Severe pulmonary hypertension with cor pulmonale PTA on metoprolol 25 mg twice daily, torsemide 60-80 mg twice daily It seems patient was over diuresed and hence his creatinine and sodium level are running elevated.  Currently diuretics on hold.  Continue metoprolol.  AKI on CKD  3a Contraction alkalosis Hypernatremia Baseline creatinine less than 1.2.   Creatinine, sodium and bicarbonate level were all trending up.   Diuretics on hold.  Given IV fluid for last 3 days.  Sodium level and creatinine level gradually improved to normal.  Urine color better.  IV fluid was stopped this morning.  As his appetite improves, will plan to resume diuretics.  Recent Labs    11/20/22 0325 11/21/22 0310 11/22/22 0342 11/23/22 0604 11/24/22 0243 11/25/22 0316 11/26/22 0438 11/27/22 0400 11/28/22 0937 11/29/22 0708  BUN 17 23 28* 33* 44* 55* 47* 35* 25* 21  CREATININE 1.34* 1.38* 1.28* 1.30* 1.48* 1.60* 1.54* 1.39* 1.28* 1.22  CO2 33* 32 31 37* 35* 41* 36* 39* 29 33*    Recent Labs  Lab 11/23/22 0604 11/24/22 0243 11/25/22 0316 11/26/22 0438 11/27/22 0400 11/28/22 0937 11/29/22 0708  NA 148* 148* 156* 153* 156* 149* 145    Morbid Obesity  Body mass index is 41.81 kg/m. Patient has been advised to make an attempt to improve diet and exercise patterns to aid in weight loss.   OHS/Sleep apnea Chronic hypoxic respiratory failure Continue usual supportive oxygen at baseline of 4-6LPM as confirmed by daughter - utilizing Venturi facemask as needed as patient often mouth breathes Encourage nightly BiPAP.  Did not tolerate last night.  Type 2 diabetes mellitus A1c 7 on March 2024 PTA on metformin 1000 mg daily, glipizide 10 mg daily Currently both of them are on hold.  Continue sliding scale insulin with Accu-Cheks.  Once appetite improves, may need long-acting insulin or reinitiation of oral meds. Recent Labs  Lab 11/28/22 1545 11/28/22 2149 11/29/22  1324 11/29/22 0753 11/29/22 1116  GLUCAP 131* 122* 133* 139* 149*     HLD Lipitor  Gout Allopurinol  Constipation Continue Senokot scheduled and as needed MiraLAX.   Impaired mobility  Was reportedly independent prior to presentation.  Mobility impaired currently because of vertebral fracture  requiring surgery and prolonged hospitalization. PT/OT eval obtained.  CIR recommended.  Goals of care   Code Status: Full Code     DVT prophylaxis:  SCDs Start: 11/18/22 1455 apixaban (ELIQUIS) tablet 5 mg   Antimicrobials: None Fluid: None Consultants: Neurosurgery Family Communication: Daughter at bedside  Status: Inpatient Level of care:  Progressive   Patient from: Home Anticipated d/c to: Medically stable for discharge CIR denied by insurance.   Diet:  Diet Order             Diet regular Room service appropriate? Yes; Fluid consistency: Thin  Diet effective now                   Scheduled Meds:  acetaminophen  1,000 mg Oral TID   allopurinol  100 mg Oral Daily   apixaban  5 mg Oral BID   atorvastatin  20 mg Oral Daily   diltiazem  180 mg Oral Daily   insulin aspart  0-15 Units Subcutaneous TID WC   insulin aspart  0-5 Units Subcutaneous QHS   metoprolol tartrate  25 mg Oral BID   senna-docusate  1 tablet Oral BID    PRN meds: albuterol, HYDROmorphone (DILAUDID) injection, perflutren lipid microspheres (DEFINITY) IV suspension, phenol, polyethylene glycol   Infusions:     Antimicrobials: Anti-infectives (From admission, onward)    Start     Dose/Rate Route Frequency Ordered Stop   11/23/22 0600  ceFAZolin (ANCEF) IVPB 3g/100 mL premix        3 g 200 mL/hr over 30 Minutes Intravenous On call to O.R. 11/22/22 0939 11/23/22 1650       Nutritional status:  Body mass index is 41.81 kg/m.          Objective: Vitals:   11/29/22 0753 11/29/22 1120  BP: 135/80 119/75  Pulse: 82 (!) 56  Resp:    Temp: (!) 97.5 F (36.4 C) (!) 97.5 F (36.4 C)  SpO2: 97% 97%    Intake/Output Summary (Last 24 hours) at 11/29/2022 1617 Last data filed at 11/29/2022 1100 Gross per 24 hour  Intake 1046.98 ml  Output 1250 ml  Net -203.02 ml    Filed Weights   11/18/22 1104  Weight: 124.7 kg   Weight change:  Body mass index is 41.81 kg/m.    Physical Exam: General exam: Pleasant, elderly Caucasian male.  Controlled.  On 6lpm O2 Skin: No rashes, lesions or ulcers. HEENT: Atraumatic, normocephalic, no obvious bleeding Lungs: Clear to auscultation bilaterally.. CVS: Regular rate and rhythm, no murmur soft GI/Abd: Nontender, nondistended, bowel sound present CNS: Alert, awake, slow to respond but oriented Psychiatry: Mood appropriate. Extremities: No pedal edema, no calf tenderness  Data Review: I have personally reviewed the laboratory data and studies available.  F/u labs ordered Unresulted Labs (From admission, onward)     Start     Ordered   11/29/22 1610  Glucose, capillary  Once,   R        11/29/22 1610   11/29/22 0500  Basic metabolic panel  Daily,   R     Question:  Specimen collection method  Answer:  Lab=Lab collect   11/28/22 0814  Total time spent in review of labs and imaging, patient evaluation, formulation of plan, documentation and communication with family: 45 minutes  Signed, Lorin Glass, MD [facility] 11/29/2022

## 2022-11-29 NOTE — Progress Notes (Signed)
Pt refused use of CPAP for the night.

## 2022-11-29 NOTE — Progress Notes (Signed)
Physical Therapy Treatment Patient Details Name: [name] [name] MRN: [id number] DOB: [birth year] Today's Date: 11/29/2022   History of Present Illness Pt is a 77 y.o. male who presented 11/18/22 after falling down a few stairs at home. Pt sustained T12-L1 unstable fx. S/p ORIF of T11-L2 4/16. PMH: morbid obesity, OSA, OHS, DM2, HTN, HLD, COPD, severe pulmonary hypertension, chronic diastolic and right-sided CHF, chronic 3 to 5 L oxygen requirement, permanent A-fib on Eliquis, migraine, arthritis, gout    PT Comments    Pt premedicated prior to session. Pt making steady progress towards his physical therapy goals today, demonstrating improvements in sitting balance and transition from lift equipment out of bed to RW. Initiated session with warm up exercise for BLE's and bed mobility to don brace and perform peri care. Pt requiring two person min-mod assist for transfers and ambulating ~3 ft with a walker. Chair brought up behind pt. Would benefit from intensive rehabilitation to address deficits, maximize functional mobility and decrease caregiver bruden.    Recommendations for follow up therapy are one component of a multi-disciplinary discharge planning process, led by the attending physician.  Recommendations may be updated based on patient status, additional functional criteria and insurance authorization.  Follow Up Recommendations       Assistance Recommended at Discharge Frequent or constant Supervision/Assistance  Patient can return home with the following Two people to help with walking and/or transfers;Two people to help with bathing/dressing/bathroom;Assistance with cooking/housework;Direct supervision/assist for financial management;Direct supervision/assist for medications management;Assist for transportation;Help with stairs or ramp for entrance   Equipment Recommendations  Other (comment) (TBA)    Recommendations for Other Services       Precautions / Restrictions  Precautions Precautions: Fall;Back Precaution Booklet Issued: No Precaution Comments: reviewed spinal precautions; watch SpO2 (6L baseline) Required Braces or Orthoses: Spinal Brace Spinal Brace: Thoracolumbosacral orthotic;Applied in supine position (clamshell brace) Restrictions Weight Bearing Restrictions: No     Mobility  Bed Mobility Overal bed mobility: Needs Assistance Bed Mobility: Rolling, Sidelying to Sit Rolling: Mod assist Sidelying to sit: Moe assist, +2 for physical assistance       General bed mobility comments: Cues for initiation, bending contralateral leg and reaching with contralateral arm for railing in addition to head turns. Multiple rolls to R/L for peri care, donning brace, and pad change. MaxA + 2 to progress from sidelying to sitting with assist of BLE's and trunk    Transfers Overall transfer level: Needs assistance Equipment used: Rolling walker (2 wheels) Transfers: Sit to/from Stand Sit to Stand: +2 physical assistance, Mod assist           General transfer comment: ModA + 2 to stand from edge of bed x 2, cues for hand placement, assist to power up and steady    Ambulation/Gait Ambulation/Gait assistance: Min assist, +2 physical assistance Gait Distance (Feet): 3 Feet Assistive device: Rolling walker (2 wheels) Gait Pattern/deviations: Decreased stride length, Shuffle, Step-to pattern       General Gait Details: Slow and shuffling steps, cues for stepping initiation, chair brought up behind pt. MinA + 2 for balance   Stairs             Wheelchair Mobility    Modified Rankin (Stroke Patients Only)       Balance Overall balance assessment: Needs assistance Sitting-balance support: Bilateral upper extremity supported, Feet supported Sitting balance-[name] Scale: Poor Sitting balance - Comments: pt initially requiring minA due to retropulsion, progressing to min guard assist Postural control: Posterior lean Standing  balance  support: Bilateral upper extremity supported, During functional activity, Reliant on assistive device for balance Standing balance-[name] Scale: Poor Standing balance comment: reliant on BUE support                            Cognition Arousal/Alertness: Awake/alert Behavior During Therapy: Flat affect Overall Cognitive Status: Difficult to assess                                 General Comments: Pt with some difficulty problem solving with rolling in bed. Internally distracted by pain. Decreased initiation        Exercises General Exercises - Lower Extremity Ankle Circles/Pumps: Both, 10 reps, Supine Quad Sets: Both, 10 reps, Supine    General Comments        Pertinent Vitals/Pain Pain Assessment Pain Assessment: Faces Faces Pain Scale: Hurts whole lot Pain Location: back Pain Descriptors / Indicators: Discomfort, Grimacing, Guarding Pain Intervention(s): Premedicated before session, Monitored during session, Limited activity within patient's tolerance    Home Living                          Prior Function            PT Goals (current goals can now be found in the care plan section) Acute Rehab PT Goals Patient Stated Goal: to reduce pain PT Goal Formulation: With patient/family Time For Goal Achievement: 12/09/22 Potential to Achieve Goals: Good Progress towards PT goals: Progressing toward goals    Frequency    Min 5X/week      PT Plan Current plan remains appropriate    Co-evaluation PT/OT/SLP Co-Evaluation/Treatment: Yes Reason for Co-Treatment: For patient/therapist safety;To address functional/ADL transfers PT goals addressed during session: Mobility/safety with mobility;Balance;Proper use of DME        AM-PAC PT "6 Clicks" Mobility   Outcome Measure  Help needed turning from your back to your side while in a flat bed without using bedrails?: A Lot Help needed moving from lying on your back to sitting on the  side of a flat bed without using bedrails?: Total Help needed moving to and from a bed to a chair (including a wheelchair)?: Total Help needed standing up from a chair using your arms (e.g., wheelchair or bedside chair)?: Total Help needed to walk in hospital room?: Total Help needed climbing 3-5 steps with a railing? : Total 6 Click Score: 7    End of Session Equipment Utilized During Treatment: Gait belt;Oxygen;Back brace Activity Tolerance: Patient tolerated treatment well Patient left: in chair;with call bell/phone within reach;with chair alarm set;with family/visitor present Nurse Communication: Mobility status PT Visit Diagnosis: Unsteadiness on feet (R26.81);Muscle weakness (generalized) (M62.81);Difficulty in walking, not elsewhere classified (R26.2);Pain Pain - Right/Left: Right Pain - part of body: Leg (and back)     Time: 0822-0900 PT Time Calculation (min) (ACUTE ONLY): 38 min  Charges:  $Therapeutic Activity: 23-37 mins                     Lillia Pauls, PT, DPT Acute Rehabilitation Services Office [phone]    Norval Morton 11/29/2022, 10:04 AM

## 2022-11-30 DIAGNOSIS — S32018G Other fracture of first lumbar vertebra, subsequent encounter for fracture with delayed healing: Secondary | ICD-10-CM | POA: Diagnosis not present

## 2022-11-30 LAB — BASIC METABOLIC PANEL
Anion gap: 5 (ref 5–15)
BUN: 17 mg/dL (ref 8–23)
CO2: 33 mmol/L — ABNORMAL HIGH (ref 22–32)
Calcium: 9.6 mg/dL (ref 8.9–10.3)
Chloride: 108 mmol/L (ref 98–111)
Creatinine, Ser: 1.26 mg/dL — ABNORMAL HIGH (ref 0.61–1.24)
GFR, Estimated: 59 mL/min — ABNORMAL LOW (ref >60)
Glucose, Bld: 129 mg/dL — ABNORMAL HIGH (ref 70–99)
Potassium: 3.5 mmol/L (ref 3.5–5.1)
Sodium: 146 mmol/L — ABNORMAL HIGH (ref 135–145)

## 2022-11-30 LAB — GLUCOSE, CAPILLARY
Glucose-Capillary: 118 mg/dL — ABNORMAL HIGH (ref 70–99)
Glucose-Capillary: 120 mg/dL — ABNORMAL HIGH (ref 70–99)
Glucose-Capillary: 116 mg/dL — ABNORMAL HIGH (ref 70–99)
Glucose-Capillary: 96 mg/dL (ref 70–99)

## 2022-11-30 MED ORDER — OXYCODONE HCL 5 MG PO TABS
5.0000 mg | ORAL_TABLET | Freq: Four times a day (QID) | ORAL | Status: DC | PRN
  Administered 2022-11-30 – 2022-12-06 (×8): 5 mg via ORAL
  Filled 2022-11-30 (×9): qty 1

## 2022-11-30 NOTE — Progress Notes (Signed)
PROGRESS NOTE  [name] [name]  DOB: 1946/08/09  PCP: Corwin Levins, MD UJW:[id number]  DOA: 11/18/2022  LOS: 12 days  Hospital Day: 13  Brief narrative: [name] [name] is a 77 y.o. male with PMH significant for morbid obesity, OSA, OHS, DM2, HTN, HLD, COPD, severe pulmonary hypertension, chronic diastolic and right-sided CHF, chronic 3 to 5 L oxygen requirement, permanent A-fib on Eliquis, migraine, arthritis, gout. 4/11, patient presented to the ED after a mechanical fall when he missed a step and fell onto his back with resultant severe back pain.  In the ED, CT spine noted T12/L1 level widening of the anterior disc space and mildly displaced fracture of the posterior superior endplate of L1. Admitted to [facility] Neurosurgery recommended surgical stabilization 4/16, patient underwent ORIF of the fracture, T11-L2 instrumented stabilization.  Subjective: Patient was seen and examined this morning. Lying down on bed.  Daughter at bedside On 3 L oxygen by nasal cannula this morning.  Not in distress.  Creatinine slightly up today. CIR denied by insurance.  Family deciding to appeal  Assessment and plan: T12-L1 endplate spine fracture d/t mechanical fall S/p ORIF and instrumentation T11-L2 Currently in pain management with Tylenol as needed, Dilaudid as needed, oxycodone as needed With neurosurgery clearance, Eliquis was resumed on 4/19.   PT/OT eval obtained. CIR recommended but denied by insurance.  Appeal in process  Chronic atrial fibrillation  Continue Cardizem 180 mg daily, metoprolol 25 mg twice daily. Continue anticoagulation with Eliquis   Chronic diastolic CHF ????Severe pulmonary hypertension with cor pulmonale Chronic respiratory failure with hypoxia PTA on metoprolol 25 mg twice daily, torsemide 60-80 mg twice daily It seems patient was over diuresed and hence his creatinine and sodium level were elevated.  Diuretics were held and patient was given IV hydration for several days.    Per previous records, patient has severe pulmonary hypertension.  However echocardiogram obtained 4/22 showed normal EF, mild LVH and normal RV systolic function and size with moderately dilated right and left atrium.  No mention of severe pulmonary artery pressure.  Currently continued on metoprolol only. Follow-up with cardiology as an outpatient. Chronically on supplemental oxygen.  Was on 6 L at home.  Currently on 3 L only.  AKI on CKD 3a Contraction alkalosis Hypernatremia Baseline creatinine less than 1.2.   Creatinine, sodium and bicarbonate level are all improving now after diuretics were held and IV hydration was given IV fluid restarted yesterday.  Encourage oral hydration.  Continue monitor creatinine and sodium.  Recent Labs    11/21/22 0310 11/22/22 0342 11/23/22 0604 11/24/22 0243 11/25/22 0316 11/26/22 0438 11/27/22 0400 11/28/22 0937 11/29/22 0708 11/30/22 0458  BUN 23 28* 33* 44* 55* 47* 35* 25* 21 17  CREATININE 1.38* 1.28* 1.30* 1.48* 1.60* 1.54* 1.39* 1.28* 1.22 1.26*  CO2 32 31 37* 35* 41* 36* 39* 29 33* 33*   Recent Labs  Lab 11/24/22 0243 11/25/22 0316 11/26/22 0438 11/27/22 0400 11/28/22 0937 11/29/22 0708 11/30/22 0458  NA 148* 156* 153* 156* 149* 145 146*   Morbid Obesity  Body mass index is 41.81 kg/m. Patient has been advised to make an attempt to improve diet and exercise patterns to aid in weight loss.   OHS/Sleep apnea Chronic hypoxic respiratory failure Continue usual supportive oxygen at baseline of 4-6LPM as confirmed by daughter - utilizing Venturi facemask as needed as patient often mouth breathes Encourage nightly BiPAP.  Did not tolerate last night.  Type 2 diabetes mellitus A1c 7 on  March 2024 PTA on metformin 1000 mg daily, glipizide 10 mg daily Currently both of them are on hold.  Continue sliding scale insulin with Accu-Cheks.  Once appetite improves, may need long-acting insulin or reinitiation of oral meds. Recent Labs   Lab 11/29/22 1116 11/29/22 1610 11/29/22 2109 11/30/22 0608 11/30/22 1157  GLUCAP 149* 135* 129* 118* 120*    HLD Lipitor  Gout Allopurinol  Constipation Continue Senokot scheduled and as needed MiraLAX.   Impaired mobility  Was reportedly independent prior to presentation.  Mobility impaired currently because of vertebral fracture requiring surgery and prolonged hospitalization. PT/OT eval obtained.  CIR recommended.  Insurance denied.  Appeal in process  Goals of care   Code Status: Full Code     DVT prophylaxis:  SCDs Start: 11/18/22 1455 apixaban (ELIQUIS) tablet 5 mg   Antimicrobials: None Fluid: None Consultants: Neurosurgery Family Communication: Daughter at bedside  Status: Inpatient Level of care:  Progressive   Patient from: Home Anticipated d/c to: Medically stable for discharge CIR denied by insurance.  Appeal in process   Diet:  Diet Order             Diet regular Room service appropriate? Yes; Fluid consistency: Thin  Diet effective now                   Scheduled Meds:  acetaminophen  1,000 mg Oral TID   allopurinol  100 mg Oral Daily   apixaban  5 mg Oral BID   atorvastatin  20 mg Oral Daily   diltiazem  180 mg Oral Daily   insulin aspart  0-15 Units Subcutaneous TID WC   insulin aspart  0-5 Units Subcutaneous QHS   metoprolol tartrate  25 mg Oral BID   senna-docusate  1 tablet Oral BID    PRN meds: albuterol, HYDROmorphone (DILAUDID) injection, oxyCODONE, phenol, polyethylene glycol   Infusions:     Antimicrobials: Anti-infectives (From admission, onward)    Start     Dose/Rate Route Frequency Ordered Stop   11/23/22 0600  ceFAZolin (ANCEF) IVPB 3g/100 mL premix        3 g 200 mL/hr over 30 Minutes Intravenous On call to O.R. 11/22/22 0939 11/23/22 1650       Nutritional status:  Body mass index is 41.81 kg/m.          Objective: Vitals:   11/30/22 1032 11/30/22 1156  BP: (!) 165/77 127/83  Pulse: 71  99  Resp:  20  Temp:  98 F (36.7 C)  SpO2:  96%    Intake/Output Summary (Last 24 hours) at 11/30/2022 1257 Last data filed at 11/30/2022 0424 Gross per 24 hour  Intake --  Output 500 ml  Net -500 ml   Filed Weights   11/18/22 1104  Weight: 124.7 kg   Weight change:  Body mass index is 41.81 kg/m.   Physical Exam: General exam: Pleasant, elderly Caucasian male.  Controlled.  On 3lpm O2 Skin: No rashes, lesions or ulcers. HEENT: Atraumatic, normocephalic, no obvious bleeding Lungs: Clear to auscultation bilaterally.. CVS: Regular rate and rhythm, no murmur soft GI/Abd: Nontender, nondistended, bowel sound present CNS: Alert, awake, oriented to place and person.  More conversational today. Psychiatry: Mood appropriate. Extremities: No pedal edema, no calf tenderness  Data Review: I have personally reviewed the laboratory data and studies available.  F/u labs ordered Unresulted Labs (From admission, onward)     Start     Ordered   11/29/22 0500  Basic metabolic  panel  Daily,   R     Question:  Specimen collection method  Answer:  Lab=Lab collect   11/28/22 0814            Total time spent in review of labs and imaging, patient evaluation, formulation of plan, documentation and communication with family: 45 minutes  Signed, Lorin Glass, MD [facility] 11/30/2022

## 2022-11-30 NOTE — Progress Notes (Signed)
Pt encouraged to wear his CPAP for the night, pt agreed. Placed on CPAP w/ 5L bleed in, pt tolerating well.

## 2022-11-30 NOTE — Progress Notes (Signed)
Inpatient Rehab Admissions Coordinator:   Received a denial for CIR request from [facility].  Discussed with pt's daughter, [name], who would like to appeal decision.  I have faxed over the appeal this morning and will follow for determination.   Estill Dooms, PT, DPT Admissions Coordinator [phone] 11/30/22  9:30 AM

## 2022-11-30 NOTE — Progress Notes (Signed)
Physical Therapy Treatment Patient Details Name: [name] [name] MRN: [id number] DOB: [birth year] Today's Date: 11/30/2022   History of Present Illness Pt is a 77 y.o. male who presented 11/18/22 after falling down a few stairs at home. Pt sustained T12-L1 unstable fx. S/p ORIF of T11-L2 4/16. PMH: morbid obesity, OSA, OHS, DM2, HTN, HLD, COPD, severe pulmonary hypertension, chronic diastolic and right-sided CHF, chronic 3 to 5 L oxygen requirement, permanent A-fib on Eliquis, migraine, arthritis, gout    PT Comments    Pt agreeable to participate and is maintaining level of functional mobility. Pt with improvement in initiation of rolling to R/L for donning brace in supine. Requiring two person moderate assist to stand from edge of bed, taking ~2 steps forward with RW before chair brought up behind pt to sit. Demonstrates generalized weakness, pain, decreased problem solving skills, impaired sitting and standing balance (posterior lean). Will benefit from post acute rehab to address deficits, maximize functional mobility and decrease caregiver burden.    Recommendations for follow up therapy are one component of a multi-disciplinary discharge planning process, led by the attending physician.  Recommendations may be updated based on patient status, additional functional criteria and insurance authorization.  Follow Up Recommendations       Assistance Recommended at Discharge Frequent or constant Supervision/Assistance  Patient can return home with the following Two people to help with walking and/or transfers;Two people to help with bathing/dressing/bathroom;Assistance with cooking/housework;Direct supervision/assist for financial management;Direct supervision/assist for medications management;Assist for transportation;Help with stairs or ramp for entrance   Equipment Recommendations  Other (comment) (TBA)    Recommendations for Other Services       Precautions / Restrictions  Precautions Precautions: Fall;Back Precaution Booklet Issued: No Precaution Comments: reviewed spinal precautions; watch SpO2 (6L baseline) Required Braces or Orthoses: Spinal Brace Spinal Brace: Thoracolumbosacral orthotic;Applied in supine position (clamshell brace) Restrictions Weight Bearing Restrictions: No     Mobility  Bed Mobility Overal bed mobility: Needs Assistance Bed Mobility: Rolling, Sidelying to Sit Rolling: Mod assist Sidelying to sit: Draylen assist, +2 for physical assistance       General bed mobility comments: Cues for initiation, bending contralateral leg and reaching with contralateral arm for railing. Multiple rolls to R/L for donning brace. MaxA + 2 to progress from sidelying to sitting with assist of BLE's and trunk    Transfers Overall transfer level: Needs assistance Equipment used: Rolling walker (2 wheels) Transfers: Sit to/from Stand Sit to Stand: +2 physical assistance, Mod assist           General transfer comment: ModA + 2 to stand from edge of bed, retropulsion initially    Ambulation/Gait Ambulation/Gait assistance: +2 physical assistance, Amarien assist Gait Distance (Feet): 2 Feet Assistive device: Rolling walker (2 wheels) Gait Pattern/deviations: Decreased stride length, Shuffle, Step-to pattern       General Gait Details: Pt with BLE tremulousness and pushing walker too far anteriorly. Decreased carryover of cues for upright posture and taking steps. Able to take ~2 steps forward and chair brought behind pt. Pt sitting pre-emptively   Stairs             Wheelchair Mobility    Modified Rankin (Stroke Patients Only)       Balance Overall balance assessment: Needs assistance Sitting-balance support: Bilateral upper extremity supported, Feet supported Sitting balance-[name] Scale: Poor Sitting balance - Comments: up to maxA due to heavy posterior lean   Standing balance support: Bilateral upper extremity supported, During  functional activity, Reliant on  assistive device for balance Standing balance-[name] Scale: Poor Standing balance comment: reliant on BUE support                            Cognition Arousal/Alertness: Awake/alert Behavior During Therapy: Flat affect Overall Cognitive Status: Difficult to assess                                 General Comments: Difficulty problem solving and poor safety awareness        Exercises General Exercises - Upper Extremity Shoulder Flexion: Both, 10 reps, Supine    General Comments        Pertinent Vitals/Pain Pain Assessment Pain Assessment: Faces Faces Pain Scale: Hurts whole lot Pain Location: back Pain Descriptors / Indicators: Discomfort, Grimacing, Guarding Pain Intervention(s): Limited activity within patient's tolerance, Monitored during session, Premedicated before session    Home Living                          Prior Function            PT Goals (current goals can now be found in the care plan section) Acute Rehab PT Goals Patient Stated Goal: to reduce pain PT Goal Formulation: With patient/family Time For Goal Achievement: 12/09/22 Potential to Achieve Goals: Good    Frequency    Min 5X/week      PT Plan Current plan remains appropriate    Co-evaluation              AM-PAC PT "6 Clicks" Mobility   Outcome Measure  Help needed turning from your back to your side while in a flat bed without using bedrails?: A Lot Help needed moving from lying on your back to sitting on the side of a flat bed without using bedrails?: Total Help needed moving to and from a bed to a chair (including a wheelchair)?: Total Help needed standing up from a chair using your arms (e.g., wheelchair or bedside chair)?: Total Help needed to walk in hospital room?: Total Help needed climbing 3-5 steps with a railing? : Total 6 Click Score: 7    End of Session Equipment Utilized During Treatment: Gait  belt;Oxygen;Back brace Activity Tolerance: Patient tolerated treatment well Patient left: in chair;with call bell/phone within reach;with chair alarm set;with family/visitor present Nurse Communication: Mobility status PT Visit Diagnosis: Unsteadiness on feet (R26.81);Muscle weakness (generalized) (M62.81);Difficulty in walking, not elsewhere classified (R26.2);Pain Pain - Right/Left: Right Pain - part of body: Leg (and back)     Time: 1610-9604 PT Time Calculation (min) (ACUTE ONLY): 21 min  Charges:  $Therapeutic Activity: 8-22 mins                     Lillia Pauls, PT, DPT Acute Rehabilitation Services Office [phone]    Norval Morton 11/30/2022, 3:22 PM

## 2022-11-30 NOTE — Progress Notes (Signed)
Tech came to get nurse and stated that pt has taken off CPAP. Upon entering room pt had CPAP off and states that he does not want it on due to it being too noisy. Pt switched back to 4L [place] sats in upper 90s. Respiratory notified.

## 2022-11-30 NOTE — TOC Progression Note (Signed)
Transition of Care [facility]) - Progression Note    Patient Details  Name: [name] [name] MRN: [id number] Date of Birth: 18-Jun-1946  Transition of Care [facility]) CM/SW Contact  Kermit Balo, RN Phone Number: 11/30/2022, 10:49 AM  Clinical Narrative:    Pt is appealing CIR denial.  TOC following.   Expected Discharge Plan: Skilled Nursing Facility Barriers to Discharge: Continued Medical Work up  Expected Discharge Plan and Services   Discharge Planning Services: CM Consult   Living arrangements for the past 2 months: Single Family Home                                       Social Determinants of Health (SDOH) Interventions SDOH Screenings   Food Insecurity: No Food Insecurity (11/25/2022)  Housing: Low Risk  (11/25/2022)  Transportation Needs: No Transportation Needs (11/25/2022)  Utilities: Not At Risk (11/25/2022)  Depression (PHQ2-9): Low Risk  (10/08/2022)  Tobacco Use: Medium Risk (11/25/2022)    Readmission Risk Interventions     No data to display

## 2022-11-30 NOTE — Progress Notes (Signed)
Pt refused use of CPAP for the evening.

## 2022-12-01 DIAGNOSIS — S32018G Other fracture of first lumbar vertebra, subsequent encounter for fracture with delayed healing: Secondary | ICD-10-CM | POA: Diagnosis not present

## 2022-12-01 LAB — BASIC METABOLIC PANEL
Anion gap: 11 (ref 5–15)
BUN: 23 mg/dL (ref 8–23)
CO2: 30 mmol/L (ref 22–32)
Calcium: 9.8 mg/dL (ref 8.9–10.3)
Chloride: 106 mmol/L (ref 98–111)
Creatinine, Ser: 1.4 mg/dL — ABNORMAL HIGH (ref 0.61–1.24)
GFR, Estimated: 52 mL/min — ABNORMAL LOW (ref >60)
Glucose, Bld: 117 mg/dL — ABNORMAL HIGH (ref 70–99)
Potassium: 3.8 mmol/L (ref 3.5–5.1)
Sodium: 147 mmol/L — ABNORMAL HIGH (ref 135–145)

## 2022-12-01 LAB — GLUCOSE, CAPILLARY
Glucose-Capillary: 111 mg/dL — ABNORMAL HIGH (ref 70–99)
Glucose-Capillary: 178 mg/dL — ABNORMAL HIGH (ref 70–99)
Glucose-Capillary: 143 mg/dL — ABNORMAL HIGH (ref 70–99)
Glucose-Capillary: 104 mg/dL — ABNORMAL HIGH (ref 70–99)

## 2022-12-01 MED ORDER — DEXTROSE 5 % IV SOLN: INTRAVENOUS | Status: AC

## 2022-12-01 NOTE — Progress Notes (Signed)
PROGRESS NOTE  [name] [name]  DOB: 1946/05/12  PCP: Corwin Levins, MD YQM:[id number]  DOA: 11/18/2022  LOS: 13 days  Hospital Day: 14  Brief narrative: [name] [name] is a 77 y.o. male with PMH significant for morbid obesity, OSA, OHS, DM2, HTN, HLD, COPD, severe pulmonary hypertension, chronic diastolic and right-sided CHF, chronic 3 to 5 L oxygen requirement, permanent A-fib on Eliquis, migraine, arthritis, gout. 4/11, patient presented to the ED after a mechanical fall when he missed a step and fell onto his back with resultant severe back pain.  In the ED, CT spine noted T12/L1 level widening of the anterior disc space and mildly displaced fracture of the posterior superior endplate of L1. Admitted to [facility] Neurosurgery recommended surgical stabilization 4/16, patient underwent ORIF of the fracture, T11-L2 instrumented stabilization.  Subjective: Patient was seen and examined this afternoon. Sitting up in recliner.  Not in distress.  Daughter at bedside.  Looks dehydrated.  Urine dark.  Does not seem to be drinking enough water.  Sodium and creatinine trending up again.  Assessment and plan: T12-L1 endplate spine fracture d/t mechanical fall S/p ORIF and instrumentation T11-L2 Currently in pain management with Tylenol as needed, Dilaudid as needed, oxycodone as needed With neurosurgery clearance, Eliquis was resumed on 4/19.   PT/OT eval obtained. CIR recommended but denied by insurance.  SNF in process  Chronic atrial fibrillation  Continue Cardizem 180 mg daily, metoprolol 25 mg twice daily. Continue anticoagulation with Eliquis   Chronic diastolic CHF ????Severe pulmonary hypertension with cor pulmonale Chronic respiratory failure with hypoxia PTA on metoprolol 25 mg twice daily, torsemide 60-80 mg twice daily It seems patient was over diuresed and hence his creatinine and sodium level were elevated.  Diuretics were held and patient was given IV hydration for several days.   Per  previous records, patient has severe pulmonary hypertension.  However echocardiogram obtained 4/22 showed normal EF, mild LVH and normal RV systolic function and size with moderately dilated right and left atrium.  No mention of severe pulmonary artery pressure.  Currently continued on metoprolol only. Follow-up with cardiology as an outpatient. Chronically on supplemental oxygen.  Was on 6 L at home.  Currently on 3 L only.  AKI on CKD 3a Contraction alkalosis Hypernatremia Baseline creatinine less than 1.2.   Creatinine and sodium level had stabilized yesterday with hydration and IV fluid was stopped but trending up again. Looks dehydrated.  Urine dark.  Does not seem to be drinking enough water.  Started on IV D5 drip again.  Recent Labs    11/22/22 0342 11/23/22 0604 11/24/22 0243 11/25/22 0316 11/26/22 0438 11/27/22 0400 11/28/22 0937 11/29/22 0708 11/30/22 0458 12/01/22 0351  BUN 28* 33* 44* 55* 47* 35* 25* CREATININE 1.28* 1.30* 1.48* 1.60* 1.54* 1.39* 1.28* 1.22 1.26* 1.40*  CO2 31 37* 35* 41* 36* 39* 29 33* 33* 30    Recent Labs  Lab 11/25/22 0316 11/26/22 0438 11/27/22 0400 11/28/22 0937 11/29/22 0708 11/30/22 0458 12/01/22 0351  NA 156* 153* 156* 149* 145 146* 147*    Morbid Obesity  Body mass index is 41.81 kg/m. Patient has been advised to make an attempt to improve diet and exercise patterns to aid in weight loss.   OHS/Sleep apnea Chronic hypoxic respiratory failure Continue usual supportive oxygen at baseline of 4-6LPM as confirmed by daughter - utilizing Venturi facemask as needed as patient often mouth breathes Encourage nightly BiPAP.  Did not tolerate last night.  Type 2 diabetes mellitus A1c 7 on March 2024 PTA on metformin 1000 mg daily, glipizide 10 mg daily Currently both of them are on hold.  Continue sliding scale insulin with Accu-Cheks.  Once appetite improves, may need long-acting insulin or reinitiation of oral meds. Recent  Labs  Lab 11/30/22 1157 11/30/22 1640 11/30/22 2117 12/01/22 0605 12/01/22 1250  GLUCAP 120* 116* 96 111* 178*     HLD Lipitor  Gout Allopurinol  Constipation Continue Senokot scheduled and as needed MiraLAX.   Impaired mobility  Was reportedly independent prior to presentation.  Mobility impaired currently because of vertebral fracture requiring surgery and prolonged hospitalization. PT/OT eval obtained.  CIR recommended.  Insurance denied.  SNF in process  Goals of care   Code Status: Full Code     DVT prophylaxis:  SCDs Start: 11/18/22 1455 apixaban (ELIQUIS) tablet 5 mg   Antimicrobials: None Fluid: None Consultants: Neurosurgery Family Communication: Daughter at bedside  Status: Inpatient Level of care:  Progressive   Patient from: Home Anticipated d/c to: Medically stable.  Pending SNF.  Diet:  Diet Order             Diet regular Room service appropriate? Yes; Fluid consistency: Thin  Diet effective now                   Scheduled Meds:  acetaminophen  1,000 mg Oral TID   allopurinol  100 mg Oral Daily   apixaban  5 mg Oral BID   atorvastatin  20 mg Oral Daily   diltiazem  180 mg Oral Daily   insulin aspart  0-15 Units Subcutaneous TID WC   insulin aspart  0-5 Units Subcutaneous QHS   metoprolol tartrate  25 mg Oral BID   senna-docusate  1 tablet Oral BID    PRN meds: albuterol, HYDROmorphone (DILAUDID) injection, oxyCODONE, phenol, polyethylene glycol   Infusions:   dextrose 75 mL/hr at 12/01/22 0944     Antimicrobials: Anti-infectives (From admission, onward)    Start     Dose/Rate Route Frequency Ordered Stop   11/23/22 0600  ceFAZolin (ANCEF) IVPB 3g/100 mL premix        3 g 200 mL/hr over 30 Minutes Intravenous On call to O.R. 11/22/22 0939 11/23/22 1650       Nutritional status:  Body mass index is 41.81 kg/m.          Objective: Vitals:   12/01/22 0738 12/01/22 1242  BP: (!) 146/76 106/82  Pulse: 60 (!)  58  Resp: 18 18  Temp: 98 F (36.7 C) 97.8 F (36.6 C)  SpO2: 96% 97%    Intake/Output Summary (Last 24 hours) at 12/01/2022 1424 Last data filed at 12/01/2022 0336 Gross per 24 hour  Intake --  Output 1050 ml  Net -1050 ml    Filed Weights   11/18/22 1104  Weight: 124.7 kg   Weight change:  Body mass index is 41.81 kg/m.   Physical Exam: General exam: Pleasant, elderly Caucasian male.  Controlled.  On 3lpm O2 Skin: No rashes, lesions or ulcers.  Looks dry. HEENT: Atraumatic, normocephalic, no obvious bleeding Lungs: Clear to auscultation bilaterally.. CVS: Regular rate and rhythm, no murmur soft GI/Abd: Nontender, nondistended, bowel sound present CNS: Alert, awake, oriented to place and person.  More conversational today. Psychiatry: Mood appropriate. Extremities: Chronic stasis changes.  Improved pedal edema, no calf tenderness  Data Review: I have personally reviewed the laboratory data and studies available.  F/u labs ordered [employer] (  From admission, onward)    None       Total time spent in review of labs and imaging, patient evaluation, formulation of plan, documentation and communication with family: 45 minutes  Signed, Lorin Glass, MD [facility] 12/01/2022

## 2022-12-01 NOTE — TOC Progression Note (Signed)
Transition of Care [facility]) - Progression Note    Patient Details  Name: [name] [name] MRN: [id number] Date of Birth: 10-Jul-1946  Transition of Care [facility]) CM/SW Contact  Zhania Shaheen [place]is [place], [place] Phone Number: 12/01/2022, 2:43 PM  Clinical Narrative:     CSW is informed that pt's CIR [facility] appeal was denied and that family is agreeable to SNF. CSW called pt's daughter and confirmed plan is to pursue STR at [facility]. CSW explained SNF w/u process. Fl2 completed and bed requests sent in hub,.   Expected Discharge Plan: Skilled Nursing Facility Barriers to Discharge: SNF Pending bed offer, Insurance Authorization  Expected Discharge Plan and Services   Discharge Planning Services: CM Consult   Living arrangements for the past 2 months: Single Family Home                                       Social Determinants of Health (SDOH) Interventions SDOH Screenings   Food Insecurity: No Food Insecurity (11/25/2022)  Housing: Low Risk  (11/25/2022)  Transportation Needs: No Transportation Needs (11/25/2022)  Utilities: Not At Risk (11/25/2022)  Depression (PHQ2-9): Low Risk  (10/08/2022)  Tobacco Use: Medium Risk (11/25/2022)    Readmission Risk Interventions     No data to display

## 2022-12-01 NOTE — Progress Notes (Addendum)
Inpatient Rehab Admissions Coordinator:   Awaiting determination from [facility] Medicare on expedited appeal.  Will follow.   Addendum: 1423: Received a denial from [facility] Medicare on expedited appeal.  Pt will need alternate level of care.  I discussed with his daughter, [name], and she feels SNF is going to be needed so I will pass on to [facility].  Will sign off for CIR.   Estill Dooms, PT, DPT Admissions Coordinator [phone] 12/01/22  9:46 AM

## 2022-12-01 NOTE — Progress Notes (Addendum)
Physical Therapy Treatment Patient Details Name: [name] [name] MRN: [id number] DOB: 02-May-1946 Today's Date: 12/01/2022   History of Present Illness Pt is a 77 y.o. male who presented 11/18/22 after falling down a few stairs at home. Pt sustained T12-L1 unstable fx. S/p ORIF of T11-L2 4/16. PMH: morbid obesity, OSA, OHS, DM2, HTN, HLD, COPD, severe pulmonary hypertension, chronic diastolic and right-sided CHF, chronic 3 to 5 L oxygen requirement, permanent A-fib on Eliquis, migraine, arthritis, gout    PT Comments    Session focused on bed mobility and pre transfer training with use of Stedy (lift equipment). Due to continued retropulsion, benefits from cue to anteriorly weight shift by holding onto bar of Stedy. Worked on sit to stands and functional reaching. Transferred to chair. Will benefit from post acute rehab to address deficits, maximize functional mobility and decrease caregiver burden.     Recommendations for follow up therapy are one component of a multi-disciplinary discharge planning process, led by the attending physician.  Recommendations may be updated based on patient status, additional functional criteria and insurance authorization.  Follow Up Recommendations       Assistance Recommended at Discharge Frequent or constant Supervision/Assistance  Patient can return home with the following Two people to help with walking and/or transfers;Two people to help with bathing/dressing/bathroom;Assistance with cooking/housework;Direct supervision/assist for financial management;Direct supervision/assist for medications management;Assist for transportation;Help with stairs or ramp for entrance   Equipment Recommendations  Other (comment) (TBA)    Recommendations for Other Services       Precautions / Restrictions Precautions Precautions: Fall;Back Precaution Booklet Issued: No Precaution Comments: reviewed spinal precautions; watch SpO2 (6L baseline) Required Braces or Orthoses:  Spinal Brace Spinal Brace: Thoracolumbosacral orthotic;Applied in supine position (clamshell brace) Restrictions Weight Bearing Restrictions: No     Mobility  Bed Mobility Overal bed mobility: Needs Assistance Bed Mobility: Rolling, Sidelying to Sit Rolling: Mod assist Sidelying to sit: Vian assist, +2 for physical assistance       General bed mobility comments: verbal cues to initiate and assistance with BLEs and trunk, education on log rolling technique    Transfers Overall transfer level: Needs assistance Equipment used: Rolling walker (2 wheels) Transfers: Sit to/from Stand, Bed to chair/wheelchair/BSC Sit to Stand: +2 physical assistance, Mod assist           General transfer comment: Mod assist +2 to stand from EOB into stedy. Patient able to stand with min guard assist from [employer] via Lift Equipment: Stedy  Ambulation/Gait                   Stairs             Wheelchair Mobility    Modified Rankin (Stroke Patients Only)       Balance Overall balance assessment: Needs assistance Sitting-balance support: Bilateral upper extremity supported, Feet supported Sitting balance-[name] Scale: Poor Sitting balance - Comments: heavy posterior leaning requiring Brook assist, mod assist with patient using Stedy for support Postural control: Posterior lean Standing balance support: Bilateral upper extremity supported, During functional activity, Reliant on assistive device for balance Standing balance-[name] Scale: Poor Standing balance comment: able to static stand in [place] with stedy for support                            Cognition Arousal/Alertness: Awake/alert Behavior During Therapy: Flat affect Overall Cognitive Status: Difficult to assess  General Comments: increased time to follow commands and slow processing. keeping eyes closed frequently        Exercises Other  Exercises Other Exercises: x5 sit to stands with Stedy Other Exercises: Functional reaching with BUE's in Stedy to promote anterior weight shift    General Comments General comments (skin integrity, edema, etc.): 5 liters o2      Pertinent Vitals/Pain Pain Assessment Pain Assessment: Faces Faces Pain Scale: Hurts whole lot Pain Location: back Pain Descriptors / Indicators: Discomfort, Grimacing, Guarding Pain Intervention(s): Limited activity within patient's tolerance, Monitored during session, Premedicated before session    Home Living                          Prior Function            PT Goals (current goals can now be found in the care plan section) Acute Rehab PT Goals Potential to Achieve Goals: Good    Frequency    Min 5X/week      PT Plan Current plan remains appropriate    Co-evaluation PT/OT/SLP Co-Evaluation/Treatment: Yes Reason for Co-Treatment: For patient/therapist safety;To address functional/ADL transfers PT goals addressed during session: Mobility/safety with mobility;Balance;Proper use of DME OT goals addressed during session: ADL's and self-care;Strengthening/ROM      AM-PAC PT "6 Clicks" Mobility   Outcome Measure  Help needed turning from your back to your side while in a flat bed without using bedrails?: A Lot Help needed moving from lying on your back to sitting on the side of a flat bed without using bedrails?: Total Help needed moving to and from a bed to a chair (including a wheelchair)?: Total Help needed standing up from a chair using your arms (e.g., wheelchair or bedside chair)?: Total Help needed to walk in hospital room?: Total Help needed climbing 3-5 steps with a railing? : Total 6 Click Score: 7    End of Session Equipment Utilized During Treatment: Gait belt;Oxygen;Back brace Activity Tolerance: Patient tolerated treatment well Patient left: in chair;with call bell/phone within reach;with chair alarm set;with  family/visitor present Nurse Communication: Mobility status PT Visit Diagnosis: Unsteadiness on feet (R26.81);Muscle weakness (generalized) (M62.81);Difficulty in walking, not elsewhere classified (R26.2);Pain Pain - part of body:  (back)     Time: 9562-1308 PT Time Calculation (min) (ACUTE ONLY): 26 min  Charges:  $Therapeutic Activity: 8-22 mins                     Lillia Pauls, PT, DPT Acute Rehabilitation Services Office [phone]    [name] [name] 12/01/2022, 2:46 PM

## 2022-12-01 NOTE — Plan of Care (Signed)

## 2022-12-01 NOTE — Progress Notes (Signed)
Occupational Therapy Treatment Patient Details Name: [name] [name] MRN: [id number] DOB: [birth year] Today's Date: 12/01/2022   History of present illness Pt is a 77 y.o. male who presented 11/18/22 after falling down a few stairs at home. Pt sustained T12-L1 unstable fx. S/p ORIF of T11-L2 4/16. PMH: morbid obesity, OSA, OHS, DM2, HTN, HLD, COPD, severe pulmonary hypertension, chronic diastolic and right-sided CHF, chronic 3 to 5 L oxygen requirement, permanent A-fib on Eliquis, migraine, arthritis, gout   OT comments  Patient demonstrating gains with rolling in bed with mod assist to donn back brace and assistance to maintain side lying. Patient instructed on log rolling technique and requires Jesstin assist +2 to get to EOB due to assistance with BLEs and trunk. Patient demonstrating heavy posterior leaning requiring Qu[place]z assist and mod assist with patient using Stedy for support. Mod assist +2 to stand from EOB into stedy with patient standing from within the stedy with min guard assist x3 stands with limited standing tolerance. Patient will benefit from continued inpatient follow up therapy, <3 hours/day due to limited activity tolerance due to pain. Acute OT to continue to follow.    Recommendations for follow up therapy are one component of a multi-disciplinary discharge planning process, led by the attending physician.  Recommendations may be updated based on patient status, additional functional criteria and insurance authorization.    Assistance Recommended at Discharge PRN  Patient can return home with the following  Two people to help with walking and/or transfers;Two people to help with bathing/dressing/bathroom;Assist for transportation;Help with stairs or ramp for entrance;Assistance with cooking/housework   Equipment Recommendations  Other (comment) (defer to next venue)    Recommendations for Other Services      Precautions / Restrictions Precautions Precautions: Fall;Back Precaution  Booklet Issued: No Precaution Comments: reviewed spinal precautions; watch SpO2 (6L baseline) Required Braces or Orthoses: Spinal Brace Spinal Brace: Thoracolumbosacral orthotic;Applied in supine position (clamshell brace) Restrictions Weight Bearing Restrictions: No       Mobility Bed Mobility Overal bed mobility: Needs Assistance Bed Mobility: Rolling, Sidelying to Sit Rolling: Mod assist Sidelying to sit: Elman assist, +2 for physical assistance       General bed mobility comments: verbal cues to initiate and assistance with BLEs and trunk, education on log rolling technique    Transfers Overall transfer level: Needs assistance Equipment used: Rolling walker (2 wheels) Transfers: Sit to/from Stand, Bed to chair/wheelchair/BSC Sit to Stand: +2 physical assistance, Mod assist           General transfer comment: Mod assist +2 to stand from EOB into stedy. Patient able to stand with min guard assist from [employer] via Lift Equipment: Stedy   Balance Overall balance assessment: Needs assistance Sitting-balance support: Bilateral upper extremity supported, Feet supported Sitting balance-[name] Scale: Poor Sitting balance - Comments: heavy posterior leaning requiring Massiah assist, mod assist with patient using Stedy for support Postural control: Posterior lean Standing balance support: Bilateral upper extremity supported, During functional activity, Reliant on assistive device for balance Standing balance-[name] Scale: Poor Standing balance comment: able to static stand in [place] with stedy for support                           ADL either performed or assessed with clinical judgement   ADL Overall ADL's : Needs assistance/impaired  General ADL Comments: focused on bed mobility, standing and transfers    Extremity/Trunk Assessment              Vision       Perception     Praxis       Cognition Arousal/Alertness: Awake/alert Behavior During Therapy: Flat affect Overall Cognitive Status: Difficult to assess                                 General Comments: increased time to follow commands and slow processing        Exercises      Shoulder Instructions       General Comments 5 liters o2    Pertinent Vitals/ Pain       Pain Assessment Pain Assessment: Faces Faces Pain Scale: Hurts whole lot Pain Location: back Pain Descriptors / Indicators: Discomfort, Grimacing, Guarding Pain Intervention(s): Limited activity within patient's tolerance, Monitored during session, Premedicated before session, Repositioned  Home Living                                          Prior Functioning/Environment              Frequency  Min 2X/week        Progress Toward Goals  OT Goals(current goals can now be found in the care plan section)  Progress towards OT goals: Progressing toward goals  Acute Rehab OT Goals Patient Stated Goal: less pain OT Goal Formulation: With patient/family Time For Goal Achievement: 12/09/22 Potential to Achieve Goals: Good ADL Goals Pt Will Perform Grooming: with min assist;sitting;standing;with caregiver independent in assisting Pt Will Transfer to Toilet: with supervision;with min assist;stand pivot transfer;bedside commode;regular height toilet;ambulating Pt Will Perform Toileting - Clothing Manipulation and hygiene: with supervision;with min assist;with caregiver independent in assisting;with adaptive equipment;sit to/from stand;sitting/lateral leans Additional ADL Goal #1: Pt will be supervision bed mobility and Min A TLSO application in preparation for increased participation with ADL's (caregiver/family independent with assisting). Additional ADL Goal #2: Pt will be Mod I seated ADL's at EOB for or more in preparation for increased participation with activities of daily living  Plan  Discharge plan remains appropriate    Co-evaluation    PT/OT/SLP Co-Evaluation/Treatment: Yes Reason for Co-Treatment: For patient/therapist safety;To address functional/ADL transfers   OT goals addressed during session: ADL's and self-care;Strengthening/ROM      AM-PAC OT "6 Clicks" Daily Activity     Outcome Measure   Help from another person eating meals?: A Little Help from another person taking care of personal grooming?: A Lot Help from another person toileting, which includes using toliet, bedpan, or urinal?: Total Help from another person bathing (including washing, rinsing, drying)?: A Lot Help from another person to put on and taking off regular upper body clothing?: A Lot Help from another person to put on and taking off regular lower body clothing?: Total 6 Click Score: 11    End of Session Equipment Utilized During Treatment: Gait belt;Other (comment) Antony Salmon)  OT Visit Diagnosis: Other abnormalities of gait and mobility (R26.89);Muscle weakness (generalized) (M62.81);History of falling (Z91.81);Pain Pain - part of body:  (back)   Activity Tolerance Patient limited by pain   Patient Left in chair;with call bell/phone within reach;with chair alarm set;with family/visitor present   Nurse Communication Mobility status;Need for lift equipment  Time: 2130-8657 OT Time Calculation (min): 26 min  Charges: OT General Charges $OT Visit: 1 Visit OT Treatments $Therapeutic Activity: 8-22 mins  Alfonse Flavors, OTA Acute Rehabilitation Services  Office [phone]   Dewain Penning 12/01/2022, 2:30 PM

## 2022-12-01 NOTE — NC FL2 (Signed)
[place] MEDICAID FL2 LEVEL OF CARE FORM     IDENTIFICATION  Patient Name: [name] [name] Birthdate: [birth year] Sex: male Admission Date (Current Location): 11/18/2022  [facility] and [place] Number:  Producer, television/film/video and Address:  [facility], [address] [place], [place] [address]      Provider Number: [id number]  Attending Physician Name and Address:  Lorin Glass, MD  Relative Name and Phone Number:       Current Level of Care: Hospital Recommended Level of Care: Skilled Nursing Facility Prior Approval Number:    Date Approved/Denied:   PASRR Number: [id number]  Discharge Plan: SNF    Current Diagnoses: Patient Active Problem List   Diagnosis Date Noted   Closed fracture of eleventh thoracic vertebra 11/23/2022   Closed L2 vertebral fracture 11/18/2022   Chronic a-fib 11/18/2022   L1 vertebral fracture 11/18/2022   Subacute cough 10/10/2022   Fatigue 10/08/2022   Cellulitis of right leg 10/08/2022   Chronic respiratory failure with hypoxia and hypercapnia related to obesity hypoventilation syndrome 03/19/2022   Chronic diastolic CHF (congestive heart failure) 03/19/2022   Actinic keratosis 11/27/2021   Sebaceous cyst 11/27/2021   Urinary frequency 10/17/2021   Left arm swelling 01/18/2021   Vitamin D deficiency 10/10/2020   B12 deficiency 10/10/2020   Lower extremity weakness 09/12/2020   Lesion of right ear 04/13/2020   Arthritis of right acromioclavicular joint 04/02/2020   Right rotator cuff tear 04/02/2020   Greater trochanteric bursitis, right 08/14/2019   Tick bite, infected 12/19/2018   Degenerative arthritis of right knee 02/21/2017   Right knee pain 02/15/2017   Volume overload 12/23/2015   OSA treated with BiPAP 12/23/2015   Pulmonary hypertension 12/23/2015   Gout 12/23/2015   Cellulitis of leg, right 02/14/2015   Allergic rhinitis, cause unspecified 11/21/2013   Chronic cor pulmonale (HCC) 12/22/2012   DOE  (dyspnea on exertion) 12/20/2012   Left knee pain 07/11/2012   Increased prostate specific antigen (PSA) velocity 01/06/2012   Encounter for well adult exam with abnormal findings 12/25/2010   Long term (current) use of anticoagulants 11/06/2010   HEMATOCHEZIA 06/26/2010   BRONCHITIS, CHRONIC 01/02/2009   Obesity hypoventilation syndrome (HCC) 11/26/2008   Permanent atrial fibrillation 10/16/2008   PERIPHERAL EDEMA 01/05/2008   Non-insulin dependent type 2 diabetes mellitus 06/09/2007   COMMON MIGRAINE 06/09/2007   Unspecified hearing loss 06/09/2007   PROSTATE SPECIFIC ANTIGEN, ELEVATED 06/09/2007   Hyperlipidemia 02/18/2007   Severe obesity (BMI >= 40) 02/18/2007   Essential hypertension 02/18/2007    Orientation RESPIRATION BLADDER Height & Weight     Self, Place  O2 (4L [place]) Incontinent, External catheter Weight: 275 lb (124.7 kg) Height:   (172.7 cm)  BEHAVIORAL SYMPTOMS/MOOD NEUROLOGICAL BOWEL NUTRITION STATUS      Continent Diet (see d/c summary)  AMBULATORY STATUS COMMUNICATION OF NEEDS Skin   Extensive Assist Verbally Surgical wounds (incision back)                       Personal Care Assistance Level of Assistance  Bathing, Feeding, Dressing Bathing Assistance: Limited assistance Feeding assistance: Independent Dressing Assistance: Limited assistance     Functional Limitations Info  Sight, Hearing, Speech Sight Info: Adequate Hearing Info: Adequate Speech Info: Adequate    SPECIAL CARE FACTORS FREQUENCY  PT (By licensed PT), OT (By licensed OT)     PT Frequency: 5x/week OT Frequency: 5x/week  Contractures Contractures Info: Not present    Additional Factors Info  Code Status, Allergies Code Status Info: full code Allergies Info: Heparin, codeine           Current Medications (12/01/2022):  This is the current hospital active medication list Current Facility-Administered Medications  Medication Dose Route Frequency Provider  Last Rate Last Admin   acetaminophen (TYLENOL) tablet 1,000 mg  1,000 mg Oral TID Lorin Glass, MD   1,000 mg at 12/01/22 0936   albuterol (PROVENTIL) (2.5 MG/3ML) 0.083% nebulizer solution 2.5 mg  2.5 mg Nebulization Q6H PRN Lisbeth Renshaw, MD       allopurinol (ZYLOPRIM) tablet 100 mg  100 mg Oral Daily Lisbeth Renshaw, MD   100 mg at 12/01/22 0936   apixaban (ELIQUIS) tablet 5 mg  5 mg Oral BID Lorin Glass, MD   5 mg at 12/01/22 0936   atorvastatin (LIPITOR) tablet 20 mg  20 mg Oral Daily Lisbeth Renshaw, MD   20 mg at 11/30/22 2023   dextrose 5 % solution   Intravenous Continuous Lorin Glass, MD 75 mL/hr at 12/01/22 0944 New Bag at 12/01/22 0944   diltiazem (CARDIZEM CD) 24 hr capsule 180 mg  180 mg Oral Daily Lisbeth Renshaw, MD   180 mg at 12/01/22 0936   HYDROmorphone (DILAUDID) injection 0.5-1 mg  0.5-1 mg Intravenous Q2H PRN Lisbeth Renshaw, MD   1 mg at 11/29/22 1925   insulin aspart (novoLOG) injection 0-15 Units  0-15 Units Subcutaneous TID WC Lisbeth Renshaw, MD   3 Units at 12/01/22 1358   insulin aspart (novoLOG) injection 0-5 Units  0-5 Units Subcutaneous QHS Lisbeth Renshaw, MD       metoprolol tartrate (LOPRESSOR) tablet 25 mg  25 mg Oral BID Lisbeth Renshaw, MD   25 mg at 12/01/22 3664   oxyCODONE (Oxy IR/ROXICODONE) immediate release tablet 5 mg  5 mg Oral Q6H PRN Dahal, Melina Schools, MD   5 mg at 12/01/22 0936   phenol (CHLORASEPTIC) mouth spray 1 spray  1 spray Mouth/Throat PRN Lisbeth Renshaw, MD       polyethylene glycol (MIRALAX / GLYCOLAX) packet 17 g  17 g Oral Daily PRN Dahal, Melina Schools, MD       senna-docusate (Senokot-S) tablet 1 tablet  1 tablet Oral BID Lisbeth Renshaw, MD   1 tablet at 12/01/22 0935     Discharge Medications: Please see discharge summary for a list of discharge medications.  Relevant Imaging Results:  Relevant Lab Results:   Additional Information SSN [id number]  Erin Sons, LCSW

## 2022-12-02 LAB — GLUCOSE, CAPILLARY
Glucose-Capillary: 153 mg/dL — ABNORMAL HIGH (ref 70–99)
Glucose-Capillary: 142 mg/dL — ABNORMAL HIGH (ref 70–99)
Glucose-Capillary: 146 mg/dL — ABNORMAL HIGH (ref 70–99)
Glucose-Capillary: 124 mg/dL — ABNORMAL HIGH (ref 70–99)

## 2022-12-02 MED ORDER — DEXTROSE 5 % IV SOLN: INTRAVENOUS | Status: AC

## 2022-12-02 NOTE — Progress Notes (Signed)
Physical Therapy Treatment Patient Details Name: [name] [name] MRN: [id number] DOB: 1946/08/06 Today's Date: 12/02/2022   History of Present Illness Pt is a 77 y.o. male who presented 11/18/22 after falling down a few stairs at home. Pt sustained T12-L1 unstable fx. S/p ORIF of T11-L2 4/16. PMH: morbid obesity, OSA, OHS, DM2, HTN, HLD, COPD, severe pulmonary hypertension, chronic diastolic and right-sided CHF, chronic 3 to 5 L oxygen requirement, permanent A-fib on Eliquis, migraine, arthritis, gout    PT Comments    Pt in chair upon arrival, reporting he is ready to get back to bed. Pt required modA to transfer to stand from lower surfaces but minA to transfer to stand from higher surfaces this date. He continues to display lower extremity weakness impacting his ease and safety with standing mobility, requiring a knee block to march with his contralateral leg and bil UE support in standing. Insurance denied intensive inpatient rehab, thus pt would then benefit from short-term rehab at a less intensive inpatient rehab location, <3 hours/day. Will continue to follow acutely.     Recommendations for follow up therapy are one component of a multi-disciplinary discharge planning process, led by the attending physician.  Recommendations may be updated based on patient status, additional functional criteria and insurance authorization.  Follow Up Recommendations  Can patient physically be transported by private vehicle: No    Assistance Recommended at Discharge Frequent or constant Supervision/Assistance  Patient can return home with the following Two people to help with walking and/or transfers;Two people to help with bathing/dressing/bathroom;Assistance with cooking/housework;Direct supervision/assist for financial management;Direct supervision/assist for medications management;Assist for transportation;Help with stairs or ramp for entrance   Equipment Recommendations  Other (comment) (TBA)     Recommendations for Other Services       Precautions / Restrictions Precautions Precautions: Fall;Back Precaution Booklet Issued: No Precaution Comments: reviewed spinal precautions; watch SpO2 (6L baseline) Required Braces or Orthoses: Spinal Brace Spinal Brace: Thoracolumbosacral orthotic;Applied in supine position (clamshell brace) Restrictions Weight Bearing Restrictions: No     Mobility  Bed Mobility Overal bed mobility: Needs Assistance Bed Mobility: Rolling, Sit to Sidelying Rolling: Min assist       Sit to sidelying: [place] assist, HOB elevated General bed mobility comments: Verbal cues provided to lean laterally to R elbow and swing legs up onto bed to transition sit to sidelying, maxA to lift legs and direct trunk. MinA to roll trunk to L, cuing pt to look to L, reach R UE to L bed rail, and flex R leg to push off bed.    Transfers Overall transfer level: Needs assistance Equipment used: Rolling walker (2 wheels), Ambulation equipment used Transfers: Sit to/from Stand, Bed to chair/wheelchair/BSC Sit to Stand: Mod assist, Min assist, From elevated surface           General transfer comment: ModA to power up to stand from recliner to stedy 1x and from EOB to RW 1x, cuing for hand placement. MinA to stand from stedy flaps. Transfer via Lift Equipment: Stedy  Ambulation/Gait             Pre-gait activities: Marching L foot in place with R knee blocked and bil UE support on RW, x5 reps to march L leg but could not tolerate standing longer to alternate to R leg.     Stairs             Wheelchair Mobility    Modified Rankin (Stroke Patients Only)       Balance Overall  balance assessment: Needs assistance Sitting-balance support: Bilateral upper extremity supported, Feet supported Sitting balance-[name] Scale: Poor Sitting balance - Comments: heavy posterior leaning requiring mod-maxA for balance Postural control: Posterior lean Standing balance  support: Bilateral upper extremity supported, During functional activity, Reliant on assistive device for balance Standing balance-[name] Scale: Poor Standing balance comment: able to static stand with min guard-minA with bil UE support, modA for dynamic standing                            Cognition Arousal/Alertness: Awake/alert Behavior During Therapy: WFL for tasks assessed/performed Overall Cognitive Status: Difficult to assess                                 General Comments: increased time to follow commands and slow processing, potentially partially due to being [facility].        Exercises General Exercises - Lower Extremity Hip Flexion/Marching: AROM, Left, 5 reps, Standing (with RW and R knee blocked, unable to tolerate standing long enough to alternate to R foot) Other Exercises Other Exercises: x5 sit to stands with Stedy    General Comments General comments (skin integrity, edema, etc.): RN reporting pt was out of bed in chair and did not have brace on previously today when transferred there with nursing staff, RN reports she donned brace in chair, this PT readjusted brace in chair prior to mobilizing this session      Pertinent Vitals/Pain Pain Assessment Pain Assessment: Faces Faces Pain Scale: Hurts even more Pain Location: back Pain Descriptors / Indicators: Discomfort, Grimacing, Guarding Pain Intervention(s): Limited activity within patient's tolerance, Monitored during session, Repositioned    Home Living                          Prior Function            PT Goals (current goals can now be found in the care plan section) Acute Rehab PT Goals Patient Stated Goal: to improve and reduce pain PT Goal Formulation: With patient/family Time For Goal Achievement: 12/09/22 Potential to Achieve Goals: Good Progress towards PT goals: Progressing toward goals    Frequency    Min 5X/week      PT Plan Discharge plan needs to be  updated    Co-evaluation              AM-PAC PT "6 Clicks" Mobility   Outcome Measure  Help needed turning from your back to your side while in a flat bed without using bedrails?: A Little Help needed moving from lying on your back to sitting on the side of a flat bed without using bedrails?: A Lot Help needed moving to and from a bed to a chair (including a wheelchair)?: Total Help needed standing up from a chair using your arms (e.g., wheelchair or bedside chair)?: A Lot Help needed to walk in hospital room?: Total Help needed climbing 3-5 steps with a railing? : Total 6 Click Score: 10    End of Session Equipment Utilized During Treatment: Gait belt;Oxygen;Back brace Activity Tolerance: Patient tolerated treatment well Patient left: with call bell/phone within reach;with family/visitor present;in bed;with bed alarm set Nurse Communication: Mobility status PT Visit Diagnosis: Unsteadiness on feet (R26.81);Muscle weakness (generalized) (M62.81);Difficulty in walking, not elsewhere classified (R26.2);Pain Pain - part of body:  (back)     Time: 3244-0102 PT  Time Calculation (min) (ACUTE ONLY): 28 min  Charges:  $Therapeutic Exercise: 8-22 mins $Therapeutic Activity: 8-22 mins                     Raymond Gurney, PT, DPT Acute Rehabilitation Services  Office: [phone]    [name] [name] 12/02/2022, 12:01 PM

## 2022-12-02 NOTE — Progress Notes (Signed)
0030 taking over care for this patient. Patient alert to self place and situation on 4L [place], wounds to back from surgery are CDI with honeycomb dressings visible.

## 2022-12-02 NOTE — Progress Notes (Signed)
PROGRESS NOTE  [name] [name]  DOB: May 09, 1946  PCP: Corwin Levins, MD NGE:[id number]  DOA: 11/18/2022  LOS: 14 days  Hospital Day: 15  Brief narrative: [name] [name] is a 77 y.o. male with PMH significant for morbid obesity, OSA, OHS, DM2, HTN, HLD, COPD, severe pulmonary hypertension, chronic diastolic and right-sided CHF, chronic 3 to 5 L oxygen requirement, permanent A-fib on Eliquis, migraine, arthritis, gout. 4/11, patient presented to the ED after a mechanical fall when he missed a step and fell onto his back with resultant severe back pain.  In the ED, CT spine noted T12/L1 level widening of the anterior disc space and mildly displaced fracture of the posterior superior endplate of L1. Admitted to [facility] recommended surgical stabilization 4/16, patient underwent ORIF of the fracture, T11-L2 instrumented stabilization.  Subjective: Patient was seen and examined this morning.  Lying on bed.  Takes soda.  Does not like plain water. On dextrose drip because of hypernatremia. Pending SNF authorization  Assessment and plan: T12-L1 endplate spine fracture d/t mechanical fall S/p ORIF and instrumentation T11-L2 Currently in pain management with Tylenol as needed, Dilaudid as needed, oxycodone as needed With neurosurgery clearance, Eliquis was resumed on 4/19.   PT/OT eval obtained. CIR recommended but denied by insurance.  SNF in process  AKI on CKD 3a Contraction alkalosis Hypernatremia Baseline creatinine less than 1.2.   Creatinine and sodium level were elevated again yesterday.  Started on D5 drip.  Continue it at lower rate of 50 mill per hour today.  Recheck lab tomorrow.  I have advised patient to avoid high sodium drinks and take plain water.  Recent Labs    11/22/22 0342 11/23/22 0604 11/24/22 0243 11/25/22 0316 11/26/22 0438 11/27/22 0400 11/28/22 0937 11/29/22 0708 11/30/22 0458 12/01/22 0351  BUN 28* 33* 44* 55* 47* 35* 25* CREATININE 1.28* 1.30*  1.48* 1.60* 1.54* 1.39* 1.28* 1.22 1.26* 1.40*  CO2 31 37* 35* 41* 36* 39* 29 33* 33* 30    Recent Labs  Lab 11/26/22 0438 11/27/22 0400 11/28/22 0937 11/29/22 0708 11/30/22 0458 12/01/22 0351  NA 153* 156* 149* 145 146* 147*    Chronic diastolic CHF ????Severe pulmonary hypertension with cor pulmonale Chronic respiratory failure with hypoxia PTA on metoprolol 25 mg twice daily, torsemide 60-80 mg twice daily It seems patient was over diuresed and hence his creatinine and sodium level were elevated.  Diuretics were held and patient was given IV hydration for several days.   Per previous records, patient has severe pulmonary hypertension.  However echocardiogram obtained 4/22 showed normal EF, mild LVH and normal RV systolic function and size with moderately dilated right and left atrium.  No mention of severe pulmonary artery pressure.  Currently continued on metoprolol only. Follow-up with cardiology as an outpatient. Chronically on supplemental oxygen.  Was on 6 L at home.  Currently on 3 L only.  Chronic atrial fibrillation  Continue Cardizem 180 mg daily, metoprolol 25 mg twice daily. Continue anticoagulation with Eliquis   Morbid Obesity  Body mass index is 41.81 kg/m. Patient has been advised to make an attempt to improve diet and exercise patterns to aid in weight loss.   OHS/Sleep apnea Chronic hypoxic respiratory failure Continue usual supportive oxygen at baseline of 4-6LPM as confirmed by daughter - utilizing Venturi facemask as needed as patient often mouth breathes Encourage nightly BiPAP.  Did not tolerate last night.  Type 2 diabetes mellitus A1c 7 on March 2024 PTA  on metformin 1000 mg daily, glipizide 10 mg daily Currently both of them are on hold.  Continue sliding scale insulin with Accu-Cheks.  Once appetite improves, may need long-acting insulin or reinitiation of oral meds. Recent Labs  Lab 12/01/22 1250 12/01/22 1615 12/01/22 2138 12/02/22 0629  12/02/22 1138  GLUCAP 178* 143* 104* 153* 142*     HLD Lipitor  Gout Allopurinol  Constipation Continue Senokot scheduled and as needed MiraLAX.   Impaired mobility  Was reportedly independent prior to presentation.  Mobility impaired currently because of vertebral fracture requiring surgery and prolonged hospitalization. PT/OT eval obtained.  CIR recommended.  Insurance denied.  SNF in process  Goals of care   Code Status: Full Code     DVT prophylaxis:  SCDs Start: 11/18/22 1455 apixaban (ELIQUIS) tablet 5 mg   Antimicrobials: None Fluid: None Consultants: Neurosurgery Family Communication: Daughter at bedside  Status: Inpatient Level of care:  Progressive   Patient from: Home Anticipated d/c to: Medically stable.  Pending SNF.  Diet:  Diet Order             Diet regular Room service appropriate? Yes; Fluid consistency: Thin  Diet effective now                   Scheduled Meds:  acetaminophen  1,000 mg Oral TID   allopurinol  100 mg Oral Daily   apixaban  5 mg Oral BID   atorvastatin  20 mg Oral Daily   diltiazem  180 mg Oral Daily   insulin aspart  0-15 Units Subcutaneous TID WC   insulin aspart  0-5 Units Subcutaneous QHS   metoprolol tartrate  25 mg Oral BID   senna-docusate  1 tablet Oral BID    PRN meds: albuterol, HYDROmorphone (DILAUDID) injection, oxyCODONE, phenol, polyethylene glycol   Infusions:   dextrose 50 mL/hr at 12/02/22 1128     Antimicrobials: Anti-infectives (From admission, onward)    Start     Dose/Rate Route Frequency Ordered Stop   11/23/22 0600  ceFAZolin (ANCEF) IVPB 3g/100 mL premix        3 g 200 mL/hr over 30 Minutes Intravenous On call to O.R. 11/22/22 0939 11/23/22 1650       Nutritional status:  Body mass index is 41.81 kg/m.          Objective: Vitals:   12/02/22 0826 12/02/22 1135  BP: 120/77 109/71  Pulse: 82 93  Resp: 18 18  Temp: 97.6 F (36.4 C) 98 F (36.7 C)  SpO2: 96% 97%     Intake/Output Summary (Last 24 hours) at 12/02/2022 1306 Last data filed at 12/02/2022 1123 Gross per 24 hour  Intake 1789.47 ml  Output 1000 ml  Net 789.47 ml    Filed Weights   11/18/22 1104  Weight: 124.7 kg   Weight change:  Body mass index is 41.81 kg/m.   Physical Exam: General exam: Pleasant, elderly Caucasian male.  Controlled.  On 3lpm O2 Skin: No rashes, lesions or ulcers.  Looks dry. HEENT: Atraumatic, normocephalic, no obvious bleeding Lungs: Clear to auscultation bilaterally.. CVS: Regular rate and rhythm, no murmur soft GI/Abd: Nontender, nondistended, bowel sound present CNS: Alert, awake, oriented to place and person.  More conversational today. Psychiatry: Mood appropriate. Extremities: Chronic stasis changes.  Improved pedal edema, no calf tenderness  Data Review: I have personally reviewed the laboratory data and studies available.  F/u labs ordered [employer] (From admission, onward)     Start  Ordered   12/03/22 0500  Basic metabolic panel  Tomorrow morning,   R       Question:  Specimen collection method  Answer:  Lab=Lab collect   12/02/22 0753            Total time spent in review of labs and imaging, patient evaluation, formulation of plan, documentation and communication with family: 35 minutes  Signed, Lorin Glass, MD [facility] 12/02/2022

## 2022-12-02 NOTE — Plan of Care (Signed)
  Problem: Education: Goal: Ability to describe self-care measures that may prevent or decrease complications (Diabetes Survival Skills Education) will improve Outcome: Progressing Goal: Individualized Educational Video(s) Outcome: Progressing   Problem: Coping: Goal: Ability to adjust to condition or change in health will improve Outcome: Progressing   Problem: Fluid Volume: Goal: Ability to maintain a balanced intake and output will improve Outcome: Not Progressing   Problem: Health Behavior/Discharge Planning: Goal: Ability to identify and utilize available resources and services will improve Outcome: Progressing Goal: Ability to manage health-related needs will improve Outcome: Progressing   Problem: Metabolic: Goal: Ability to maintain appropriate glucose levels will improve Outcome: Progressing   Problem: Nutritional: Goal: Maintenance of adequate nutrition will improve Outcome: Not Progressing Goal: Progress toward achieving an optimal weight will improve Outcome: Not Progressing   Problem: Skin Integrity: Goal: Risk for impaired skin integrity will decrease Outcome: Not Progressing   Problem: Tissue Perfusion: Goal: Adequacy of tissue perfusion will improve Outcome: Not Progressing   Problem: Education: Goal: Knowledge of General Education information will improve Description: Including pain rating scale, medication(s)/side effects and non-pharmacologic comfort measures Outcome: Progressing   Problem: Health Behavior/Discharge Planning: Goal: Ability to manage health-related needs will improve Outcome: Progressing   Problem: Clinical Measurements: Goal: Ability to maintain clinical measurements within normal limits will improve Outcome: Progressing Goal: Will remain free from infection Outcome: Not Progressing Goal: Diagnostic test results will improve Outcome: Progressing Goal: Respiratory complications will improve Outcome: Not Progressing Goal:  Cardiovascular complication will be avoided Outcome: Not Progressing   Problem: Activity: Goal: Risk for activity intolerance will decrease Outcome: Not Progressing   Problem: Nutrition: Goal: Adequate nutrition will be maintained Outcome: Progressing   Problem: Coping: Goal: Level of anxiety will decrease Outcome: Progressing   Problem: Elimination: Goal: Will not experience complications related to bowel motility Outcome: Not Progressing Goal: Will not experience complications related to urinary retention Outcome: Not Progressing   Problem: Pain Managment: Goal: General experience of comfort will improve Outcome: Progressing   Problem: Safety: Goal: Ability to remain free from injury will improve Outcome: Not Progressing   Problem: Skin Integrity: Goal: Risk for impaired skin integrity will decrease Outcome: Not Progressing  Patient total care unable to get out bed with out Rakesh assist  from recent surgery incontinent of bladder

## 2022-12-02 NOTE — Progress Notes (Signed)
Current SNF bed offers given to pt's dtr at bedside. Dtr's preferred facilities ([facility]) have denied. Will f/u with dtr tomorrow re SNF choice.   Dellie Burns, MSW, LCSW [phone] (coverage)

## 2022-12-03 LAB — GLUCOSE, CAPILLARY
Glucose-Capillary: 134 mg/dL — ABNORMAL HIGH (ref 70–99)
Glucose-Capillary: 148 mg/dL — ABNORMAL HIGH (ref 70–99)
Glucose-Capillary: 126 mg/dL — ABNORMAL HIGH (ref 70–99)
Glucose-Capillary: 98 mg/dL (ref 70–99)

## 2022-12-03 LAB — BASIC METABOLIC PANEL
Anion gap: 9 (ref 5–15)
BUN: 14 mg/dL (ref 8–23)
CO2: 29 mmol/L (ref 22–32)
Calcium: 9.6 mg/dL (ref 8.9–10.3)
Chloride: 104 mmol/L (ref 98–111)
Creatinine, Ser: 1.21 mg/dL (ref 0.61–1.24)
GFR, Estimated: >60 mL/min (ref >60)
Glucose, Bld: 134 mg/dL — ABNORMAL HIGH (ref 70–99)
Potassium: 3.8 mmol/L (ref 3.5–5.1)
Sodium: 142 mmol/L (ref 135–145)

## 2022-12-03 MED ORDER — DEXTROSE 5 % IV SOLN: INTRAVENOUS | Status: AC

## 2022-12-03 NOTE — Progress Notes (Signed)
Spoke to pt's dtr re SNF choice. She plans to tour facilities this weekend and is leaning towards [facility] based on location. Will f/u with her tomorrow.   Dellie Burns, MSW, LCSW [phone] (coverage)

## 2022-12-03 NOTE — Progress Notes (Signed)
Physical Therapy Treatment Patient Details Name: [name] [name] MRN: [id number] DOB: 1946-04-13 Today's Date: 12/03/2022   History of Present Illness Pt is a 77 y.o. male who presented 11/18/22 after falling down a few stairs at home. Pt sustained T12-L1 unstable fx. S/p ORIF of T11-L2 4/16. PMH: morbid obesity, OSA, OHS, DM2, HTN, HLD, COPD, severe pulmonary hypertension, chronic diastolic and right-sided CHF, chronic 3 to 5 L oxygen requirement, permanent A-fib on Eliquis, migraine, arthritis, gout    PT Comments    Upon arrival, pt in recliner requesting to return to bed due to pain and fatigue. Attempted to facilitate progression of transfers to a stand step pivot transfer to bed using a RW. However, pt having LOB when attempting to take steps, needing modA to maintain his balance, and requesting to sit and use the stedy. Pt demonstrates improved initiation and power with transfers and rolling, but continues to display difficulty with transitioning sit to sidelying, needing maxA to complete. Pt reporting success in reducing his R ankle pain when ACE wrapped during standing mobility this date. Will continue to follow acutely.      Recommendations for follow up therapy are one component of a multi-disciplinary discharge planning process, led by the attending physician.  Recommendations may be updated based on patient status, additional functional criteria and insurance authorization.  Follow Up Recommendations  Can patient physically be transported by private vehicle: No    Assistance Recommended at Discharge Frequent or constant Supervision/Assistance  Patient can return home with the following Two people to help with walking and/or transfers;Two people to help with bathing/dressing/bathroom;Assistance with cooking/housework;Direct supervision/assist for financial management;Direct supervision/assist for medications management;Assist for transportation;Help with stairs or ramp for entrance    Equipment Recommendations  Other (comment) (TBA)    Recommendations for Other Services       Precautions / Restrictions Precautions Precautions: Fall;Back Precaution Booklet Issued: Yes (comment) Precaution Comments: reviewed spinal precautions; watch SpO2 (6L baseline) Required Braces or Orthoses: Spinal Brace Spinal Brace: Thoracolumbosacral orthotic;Applied in supine position (clamshell brace) Restrictions Weight Bearing Restrictions: No     Mobility  Bed Mobility Overal bed mobility: Needs Assistance Bed Mobility: Rolling, Sit to Sidelying Rolling: Min assist       Sit to sidelying: Larenz assist, HOB elevated General bed mobility comments: Verbal cues provided to lean laterally to R elbow and swing legs up onto bed to transition sit to sidelying, maxA to lift legs and direct trunk. MinA to roll trunk to L, good initiation by pt    Transfers Overall transfer level: Needs assistance Equipment used: Rolling walker (2 wheels), Ambulation equipment used Transfers: Sit to/from Stand, Bed to chair/wheelchair/BSC Sit to Stand: Mod assist, Min assist           General transfer comment: ModA to power up to stand from recliner to RW 1x, needing cues for hand placement and transition from recliner to RW. MinA to stand from recliner to stedy 1x Transfer via Lift Equipment: Stedy  Ambulation/Gait Ambulation/Gait assistance: Mod assist   Assistive device: Rolling walker (2 wheels) Gait Pattern/deviations: Decreased step length - right, Decreased step length - left, Decreased weight shift to right, Decreased weight shift to left     Pre-gait activities: Attempted to step laterally from recliner to R towards EOB, cuing pt to shift weight, providing tactile cues at quads of leg in stance for stability while contralateral leg stepped, but pt having trunk sway with each attempt to step and requesting to sit. Shifting weight and lifting  contralateral leg when standing in stedy, ~5  steps each leg.     Stairs             Wheelchair Mobility    Modified Rankin (Stroke Patients Only)       Balance Overall balance assessment: Needs assistance Sitting-balance support: Bilateral upper extremity supported, Feet supported Sitting balance-[name] Scale: Poor Sitting balance - Comments: posterior leaning requiring mod-min guard assist for balance Postural control: Posterior lean Standing balance support: Bilateral upper extremity supported, During functional activity, Reliant on assistive device for balance Standing balance-[name] Scale: Poor Standing balance comment: able to static stand with min guard-minA with bil UE support in stedy >1 min, modA for dynamic standing                            Cognition Arousal/Alertness: Awake/alert Behavior During Therapy: WFL for tasks assessed/performed Overall Cognitive Status: Difficult to assess                                 General Comments: increased time to follow commands and slow processing, potentially partially due to being [facility].        Exercises      General Comments        Pertinent Vitals/Pain Pain Assessment Pain Assessment: Faces Faces Pain Scale: Hurts even more Pain Location: back; R ankle Pain Descriptors / Indicators: Discomfort, Grimacing, Guarding Pain Intervention(s): Limited activity within patient's tolerance, Monitored during session, Repositioned, Other (comment) (ACE wrapped R ankle with pt reporting success in reducing pain when standing, removed end of session supine in bed)    Home Living                          Prior Function            PT Goals (current goals can now be found in the care plan section) Acute Rehab PT Goals Patient Stated Goal: to improve and reduce pain PT Goal Formulation: With patient/family Time For Goal Achievement: 12/09/22 Potential to Achieve Goals: Good Progress towards PT goals: Progressing toward goals     Frequency    Min 5X/week      PT Plan Current plan remains appropriate    Co-evaluation              AM-PAC PT "6 Clicks" Mobility   Outcome Measure  Help needed turning from your back to your side while in a flat bed without using bedrails?: A Little Help needed moving from lying on your back to sitting on the side of a flat bed without using bedrails?: A Lot Help needed moving to and from a bed to a chair (including a wheelchair)?: Total Help needed standing up from a chair using your arms (e.g., wheelchair or bedside chair)?: A Lot Help needed to walk in hospital room?: Total Help needed climbing 3-5 steps with a railing? : Total 6 Click Score: 10    End of Session Equipment Utilized During Treatment: Gait belt;Oxygen;Back brace Activity Tolerance: Patient tolerated treatment well Patient left: with call bell/phone within reach;with family/visitor present;in bed;with bed alarm set   PT Visit Diagnosis: Unsteadiness on feet (R26.81);Muscle weakness (generalized) (M62.81);Difficulty in walking, not elsewhere classified (R26.2);Pain Pain - part of body:  (back)     Time: 1610-9604 PT Time Calculation (min) (ACUTE ONLY): 23 min  Charges:  $Therapeutic Activity:  23-37 mins                     Raymond Gurney, PT, DPT Acute Rehabilitation Services  Office: [phone]    Jewel Baize 12/03/2022, 4:53 PM

## 2022-12-03 NOTE — Progress Notes (Signed)
Pt has no cpap/bipap in room, pt refused/did not tolerate.

## 2022-12-03 NOTE — Progress Notes (Signed)
Called RT assigned to this unit to place patient on BIPAP. She indicated she will be coming soon.

## 2022-12-03 NOTE — Progress Notes (Signed)
PROGRESS NOTE  [name] [name]  DOB: 27-Oct-1945  PCP: Corwin Levins, MD WUJ:[id number]  DOA: 11/18/2022  LOS: 15 days  Hospital Day: 16  Brief narrative: [name] [name] is a 77 y.o. male with PMH significant for morbid obesity, OSA, OHS, DM2, HTN, HLD, COPD, severe pulmonary hypertension, chronic diastolic and right-sided CHF, chronic 3 to 5 L oxygen requirement, permanent A-fib on Eliquis, migraine, arthritis, gout. 4/11, patient presented to the ED after a mechanical fall when he missed a step and fell onto his back with resultant severe back pain.  In the ED, CT spine noted T12/L1 level widening of the anterior disc space and mildly displaced fracture of the posterior superior endplate of L1. Admitted to [facility] Neurosurgery recommended surgical stabilization 4/16, patient underwent ORIF of the fracture, T11-L2 instrumented stabilization.  Subjective: Patient was seen and examined this afternoon.  Sitting up in recliner.  Not in distress.  No new symptoms. Daughter at bedside.  SNF pending.  Assessment and plan: T12-L1 endplate spine fracture d/t mechanical fall S/p ORIF and instrumentation T11-L2 Currently in pain management with Tylenol as needed, Dilaudid as needed, oxycodone as needed With neurosurgery clearance, Eliquis was resumed on 4/19.   PT/OT eval obtained. CIR recommended but denied by insurance.  SNF in process  AKI on CKD 3a Contraction alkalosis Hypernatremia Baseline creatinine less than 1.2.   Creatinine and sodium trend as below.  Tends to run elevated because patient does not drink plain water and only drinks soda.  I have maintained him on low rate of D5 drip to keep sodium and creatinine level from rising up again.  Clinically looks dry.  I would continue fluid for next 24 hours at least.  Recent Labs    11/23/22 0604 11/24/22 0243 11/25/22 0316 11/26/22 0438 11/27/22 0400 11/28/22 0937 11/29/22 0708 11/30/22 0458 12/01/22 0351 12/03/22 0648  BUN 33* 44* 55* 47*  35* 25* 21 17 23 14   CREATININE 1.30* 1.48* 1.60* 1.54* 1.39* 1.28* 1.22 1.26* 1.40* 1.21  CO2 37* 35* 41* 36* 39* 29 33* 33* 30 29   Recent Labs  Lab 11/27/22 0400 11/28/22 0937 11/29/22 0708 11/30/22 0458 12/01/22 0351 12/03/22 0648  NA 156* 149* 145 146* 147* 142   Chronic diastolic CHF ????Severe pulmonary hypertension with cor pulmonale Chronic respiratory failure with hypoxia PTA on metoprolol 25 mg twice daily, torsemide 60-80 mg twice daily It seems patient was over diuresed and hence his creatinine and sodium level were elevated.  Diuretics were held and patient was given IV hydration for several days.   Per previous records, patient has severe pulmonary hypertension.  However echocardiogram obtained 4/22 showed normal EF, mild LVH and normal RV systolic function and size with moderately dilated right and left atrium.  No mention of severe pulmonary artery pressure.  Currently continued on metoprolol only.  Not on diuretics for last several days.  On low rate of IV fluid and still looks dry. Follow-up with cardiology as an outpatient. Chronically on supplemental oxygen.  Was on 6 L at home.  Currently on 3 L only.  Chronic atrial fibrillation  Continue Cardizem 180 mg daily, metoprolol 25 mg twice daily. Continue anticoagulation with Eliquis   Morbid Obesity  Body mass index is 41.81 kg/m. Patient has been advised to make an attempt to improve diet and exercise patterns to aid in weight loss.   OHS/Sleep apnea Chronic hypoxic respiratory failure Continue usual supportive oxygen at baseline of 4-6LPM as confirmed by daughter - utilizing  Venturi facemask as needed as patient often mouth breathes Encourage nightly BiPAP.  But tends not to tolerate at night  Type 2 diabetes mellitus A1c 7 on March 2024 PTA on metformin 1000 mg daily, glipizide 10 mg daily Currently both of them are on hold.  Currently only on sliding scale insulin with Accu-Cheks.  Once appetite improves,  may need long-acting insulin or reinitiation of oral meds. Recent Labs  Lab 12/02/22 1138 12/02/22 1643 12/02/22 2143 12/03/22 0622 12/03/22 1116  GLUCAP 142* 146* 124* 134* 148*    HLD Lipitor  Gout Allopurinol  Constipation Continue Senokot scheduled and as needed MiraLAX.   Impaired mobility  Was reportedly independent prior to presentation.  Mobility impaired currently because of vertebral fracture requiring surgery and prolonged hospitalization. PT/OT eval obtained.  CIR recommended.  Insurance denied.  SNF in process  Goals of care   Code Status: Full Code     DVT prophylaxis:  SCDs Start: 11/18/22 1455 apixaban (ELIQUIS) tablet 5 mg   Antimicrobials: None Fluid: None Consultants: Neurosurgery Family Communication: Daughter at bedside  Status: Inpatient Level of care:  Progressive   Patient from: Home Anticipated d/c to: Medically stable.  Pending SNF.  Diet:  Diet Order             Diet regular Room service appropriate? Yes; Fluid consistency: Thin  Diet effective now                   Scheduled Meds:  acetaminophen  1,000 mg Oral TID   allopurinol  100 mg Oral Daily   apixaban  5 mg Oral BID   atorvastatin  20 mg Oral Daily   diltiazem  180 mg Oral Daily   insulin aspart  0-15 Units Subcutaneous TID WC   insulin aspart  0-5 Units Subcutaneous QHS   metoprolol tartrate  25 mg Oral BID   senna-docusate  1 tablet Oral BID    PRN meds: albuterol, HYDROmorphone (DILAUDID) injection, oxyCODONE, phenol, polyethylene glycol   Infusions:   dextrose       Antimicrobials: Anti-infectives (From admission, onward)    Start     Dose/Rate Route Frequency Ordered Stop   11/23/22 0600  ceFAZolin (ANCEF) IVPB 3g/100 mL premix        3 g 200 mL/hr over 30 Minutes Intravenous On call to O.R. 11/22/22 0939 11/23/22 1650       Nutritional status:  Body mass index is 41.81 kg/m.          Objective: Vitals:   12/03/22 0808 12/03/22  1116  BP: 134/79 93/60  Pulse: (!) 57 75  Resp: 20 20  Temp: 97.7 F (36.5 C) 98.2 F (36.8 C)  SpO2: 96% 94%    Intake/Output Summary (Last 24 hours) at 12/03/2022 1430 Last data filed at 12/03/2022 0800 Gross per 24 hour  Intake 1158.76 ml  Output 1700 ml  Net -541.24 ml   Filed Weights   11/18/22 1104  Weight: 124.7 kg   Weight change:  Body mass index is 41.81 kg/m.   Physical Exam: General exam: Pleasant, elderly Caucasian male.  Controlled.  On 3lpm O2 Skin: No rashes, lesions or ulcers.  Looks dry. HEENT: Atraumatic, normocephalic, no obvious bleeding Lungs: Clear to auscultation bilaterally CVS: Regular rate and rhythm, no murmur soft GI/Abd: Nontender, nondistended, bowel sound present CNS: Alert, awake, oriented to place and person.  More conversational today. Psychiatry: Mood appropriate. Extremities: Chronic stasis changes.  Improved pedal edema, no calf tenderness  Data Review: I have personally reviewed the laboratory data and studies available.  F/u labs ordered Unresulted Labs (From admission, onward)    None       Total time spent in review of labs and imaging, patient evaluation, formulation of plan, documentation and communication with family: 35 minutes  Signed, Lorin Glass, MD [facility] 12/03/2022

## 2022-12-04 LAB — GLUCOSE, CAPILLARY
Glucose-Capillary: 130 mg/dL — ABNORMAL HIGH (ref 70–99)
Glucose-Capillary: 124 mg/dL — ABNORMAL HIGH (ref 70–99)
Glucose-Capillary: 132 mg/dL — ABNORMAL HIGH (ref 70–99)

## 2022-12-04 NOTE — Progress Notes (Addendum)
PROGRESS NOTE    [name] [name]  ZOX:[id number] DOB: 1946-05-28 DOA: 11/18/2022 PCP: Corwin Levins, MD  Brief Narrative: [name] [name] is a 77 y.o. male with PMH significant for morbid obesity, OSA, OHS, DM2, HTN, HLD, COPD, severe pulmonary hypertension, chronic diastolic and right-sided CHF, chronic 3 to 5 L oxygen requirement, permanent A-fib on Eliquis, migraine, arthritis, gout. 4/11, patient presented to the ED after a mechanical fall when he missed a step and fell onto his back with resultant severe back pain.  In the ED, CT spine noted T12/L1 level widening of the anterior disc space and mildly displaced fracture of the posterior superior endplate of L1. Admitted to [facility] Neurosurgery recommended surgical stabilization 4/16, patient underwent ORIF of the fracture, T11-L2 instrumented stabilization.  Assessment & Plan:   Principal Problem:   L1 vertebral fracture (HCC) Active Problems:   Pulmonary hypertension (HCC)   Closed L2 vertebral fracture (HCC)   Chronic a-fib (HCC)   Closed fracture of eleventh thoracic vertebra (HCC)   T12-L1 endplate spine fracture d/t mechanical fall S/p ORIF and instrumentation T11-L2 Currently in pain management with Tylenol as needed, Dilaudid as needed, oxycodone as needed With neurosurgery clearance, Eliquis was resumed on 4/19.   PT/OT eval obtained. CIR recommended but denied by insurance.  Await SNF   AKI on CKD 3a Contraction alkalosis Hypernatremia sodium 142 from 147 his baseline creatinine is 1.2.  He does not drink enough water.  So he has been maintained on a low rate D5 to keep sodium and creatinine levels from rising up again.  This can be discontinued in a day or 2. Follow-up labs on Monday.     Chronic diastolic CHF ????Severe pulmonary hypertension with cor pulmonale Chronic respiratory failure with hypoxia PTA on metoprolol 25 mg twice daily, torsemide 60-80 mg twice daily It seems patient was over diuresed and hence his creatinine  and sodium level were elevated.  Diuretics were held and patient was given IV hydration for several days.   Per previous records, patient has severe pulmonary hypertension.  However echocardiogram obtained 4/22 showed normal EF, mild LVH and normal RV systolic function and size with moderately dilated right and left atrium.  No mention of severe pulmonary artery pressure.  Currently continued on metoprolol only.  Not on diuretics for last several days.  On low rate of IV fluid and still looks dry. Follow-up with cardiology as an outpatient. Chronically on supplemental oxygen.  Was on 6 L at home.  Currently on 3 L only.   Chronic atrial fibrillation  Continue Cardizem 180 mg daily, metoprolol 25 mg twice daily. Continue anticoagulation with Eliquis   Morbid Obesity  Body mass index is 41.81 kg/m. Patient has been advised to make an attempt to improve diet and exercise patterns to aid in weight loss.  OHS/Sleep apnea Chronic hypoxic respiratory failure Continue usual supportive oxygen at baseline of 4-6LPM as confirmed by daughter - utilizing Venturi facemask as needed as patient often mouth breathes Encourage nightly BiPAP.    Type 2 diabetes mellitus A1c 7 on March 2024 PTA on metformin 1000 mg daily, glipizide 10 mg daily Currently both of them are on hold.  Currently only on sliding scale insulin with Accu-Cheks.  Once appetite improves, may need long-acting insulin or reinitiation of oral meds.   HLD Lipitor   Gout Allopurinol   Constipation Continue Senokot scheduled and as needed MiraLAX.     Impaired mobility  Was reportedly independent prior to presentation.  Mobility  impaired currently because of vertebral fracture requiring surgery and prolonged hospitalization. PT/OT eval obtained.  CIR recommended.  Insurance denied.  SNF in process    Estimated body mass index is 41.81 kg/m as calculated from the following:   Height as of this encounter: 5\' 8"  (1.727 m).    Weight as of this encounter: 124.7 kg.  DVT prophylaxis Eliquis Antimicrobials none Consultant neurosurgery Family communication discussed with daughter at bedside Status inpatient Level of care progressive Anticipate DC to SNF patient is medically stable.   Subjective: Patient resting in bed Daughter by the bedside denies any pain when he is in bed but starts to hurt when he starts moving  Objective: Vitals:   12/03/22 2107 12/04/22 0027 12/04/22 0647 12/04/22 0822  BP: (!) 141/77 130/68 (!) 148/77 (!) 140/79  Pulse: 78 67 63 85  Resp: 20 18 18 20   Temp: 98.2 F (36.8 C) 97.9 F (36.6 C) 97.7 F (36.5 C)   TempSrc: Oral Oral Oral   SpO2: 97% 98% 97% 98%  Weight:      Height:        Intake/Output Summary (Last 24 hours) at 12/04/2022 1144 Last data filed at 12/04/2022 0700 Gross per 24 hour  Intake 486.57 ml  Output 900 ml  Net -413.43 ml   Filed Weights   11/18/22 1104  Weight: 124.7 kg    Examination:  General exam: Appears IN NAD  Respiratory system: Clear to auscultation. Respiratory effort normal. Cardiovascular system: S1 & S2 heard, RRR. No JVD, murmurs, rubs, gallops or clicks. No pedal edema. Gastrointestinal system: Abdomen is nondistended, soft and nontender. No organomegaly or masses felt. Normal bowel sounds heard. Central nervous system: Alert and oriented. No focal neurological deficits. Extremities: edema 1 plus   Data Reviewed: I have personally reviewed following labs and imaging studies  CBC: No results for input(s): "WBC", "NEUTROABS", "HGB", "HCT", "MCV", "PLT" in the last 168 hours. Basic Metabolic Panel: Recent Labs  Lab 11/28/22 0937 11/29/22 0708 11/30/22 0458 12/01/22 0351 12/03/22 0648  NA 149* 145 146* 147* 142  K 4.8 3.4* 3.5 3.8 3.8  CL 109 102 108 106 104  CO2 29 33* 33* 30 29  GLUCOSE 168* 164* 129* 117* 134*  BUN 25* 21 17 23 14   CREATININE 1.28* 1.22 1.26* 1.40* 1.21  CALCIUM 9.8 9.7 9.6 9.8 9.6   GFR: Estimated  Creatinine Clearance: 66.8 mL/min (by C-G formula based on SCr of 1.21 mg/dL). Liver Function Tests: No results for input(s): "AST", "ALT", "ALKPHOS", "BILITOT", "PROT", "ALBUMIN" in the last 168 hours. No results for input(s): "LIPASE", "AMYLASE" in the last 168 hours. No results for input(s): "AMMONIA" in the last 168 hours. Coagulation Profile: No results for input(s): "INR", "PROTIME" in the last 168 hours. Cardiac Enzymes: No results for input(s): "CKTOTAL", "CKMB", "CKMBINDEX", "TROPONINI" in the last 168 hours. BNP (last 3 results) No results for input(s): "PROBNP" in the last 8760 hours. HbA1C: No results for input(s): "HGBA1C" in the last 72 hours. CBG: Recent Labs  Lab 12/03/22 0622 12/03/22 1116 12/03/22 1625 12/03/22 2111 12/04/22 0630  GLUCAP 134* 148* 126* 98 130*   Lipid Profile: No results for input(s): "CHOL", "HDL", "LDLCALC", "TRIG", "CHOLHDL", "LDLDIRECT" in the last 72 hours. Thyroid Function Tests: No results for input(s): "TSH", "T4TOTAL", "FREET4", "T3FREE", "THYROIDAB" in the last 72 hours. Anemia Panel: No results for input(s): "VITAMINB12", "FOLATE", "FERRITIN", "TIBC", "IRON", "RETICCTPCT" in the last 72 hours. Sepsis Labs: No results for input(s): "PROCALCITON", "LATICACIDVEN" in the last  168 hours.  No results found for this or any previous visit (from the past 240 hour(s)).    Radiology Studies: No results found.   Scheduled Meds:  acetaminophen  1,000 mg Oral TID   allopurinol  100 mg Oral Daily   apixaban  5 mg Oral BID   atorvastatin  20 mg Oral Daily   diltiazem  180 mg Oral Daily   insulin aspart  0-15 Units Subcutaneous TID WC   insulin aspart  0-5 Units Subcutaneous QHS   metoprolol tartrate  25 mg Oral BID   senna-docusate  1 tablet Oral BID   Continuous Infusions:  dextrose 50 mL/hr at 12/04/22 0607     LOS: 16 days   Time spent: 37 min  Alwyn Ren, MD 12/04/2022, 11:44 AM

## 2022-12-04 NOTE — Progress Notes (Signed)
Pt's dtr has accepted offer from [employer]. [employer] auth submitted, ref # [id number]. Notified Soy with Eligha Bridegroom. Anticipate dc Monday pending medical clearance.   Dellie Burns, MSW, LCSW [phone] (coverage)

## 2022-12-05 DIAGNOSIS — I482 Chronic atrial fibrillation, unspecified: Secondary | ICD-10-CM

## 2022-12-05 LAB — GLUCOSE, CAPILLARY
Glucose-Capillary: 111 mg/dL — ABNORMAL HIGH (ref 70–99)
Glucose-Capillary: 111 mg/dL — ABNORMAL HIGH (ref 70–99)
Glucose-Capillary: 168 mg/dL — ABNORMAL HIGH (ref 70–99)
Glucose-Capillary: 100 mg/dL — ABNORMAL HIGH (ref 70–99)

## 2022-12-05 MED ORDER — OXYCODONE HCL 5 MG PO TABS: 5.0000 mg | ORAL_TABLET | Freq: Four times a day (QID) | ORAL | 0 refills | Status: DC | PRN

## 2022-12-05 MED ORDER — SENNOSIDES-DOCUSATE SODIUM 8.6-50 MG PO TABS: 1.0000 | ORAL_TABLET | Freq: Two times a day (BID) | ORAL | Status: DC

## 2022-12-05 NOTE — Consult Note (Signed)
WOC Nurse Consult Note: Reason for Consult:stage 2 PI to right buttock Wound type:pressure vs pressure plus moisture Pressure Injury POA: No Measurement:1.2cm round x 0.1cm Wound WUJ:WJXB, moist Drainage: scant serous Periwound:intact Dressing procedure/placement/frequency: I have provided Nursing with guidance for the care of this wound using a NS cleanse followed by pat dry and placement of a size appropriate piece of xeroform gauze (antimicrobial, nonadherent), Lawson # [phone]). This is to be topped with a dry gauze 2x2 and covered with a silicone foam. Changes are indicated daily and PRN inadvertent removal..  WOC nursing team will not follow, but will remain available to this patient, the nursing and medical teams.  Please re-consult if needed.  Thank you for inviting [place] to participate in this patient's Plan of Care.  Ladona Mow, MSN, RN, CNS, GNP, Leda Min, [employer], [employer] phone:  [phone]

## 2022-12-05 NOTE — Discharge Summary (Signed)
Physician Discharge Summary   Patient: [name] [name] MRN: [id number] DOB: Jun 01, 1946  Admit date:     11/18/2022  Discharge date: 12/06/2022  Discharge Physician: Kathlen Mody   PCP: Corwin Levins, MD   Recommendations at discharge:  Please follow up with neurosurgery as recommended.  Please follow up with PCP in  one week.   Discharge Diagnoses: Principal Problem:   L1 vertebral fracture (HCC) Active Problems:   Pulmonary hypertension (HCC)   Closed L2 vertebral fracture (HCC)   Chronic a-fib (HCC)   Closed fracture of eleventh thoracic vertebra [facility])    Hospital Course:  [name] [name] is a 77 y.o. male with PMH significant for morbid obesity, OSA, OHS, DM2, HTN, HLD, COPD, severe pulmonary hypertension, chronic diastolic and right-sided CHF, chronic 3 to 5 L oxygen requirement, permanent A-fib on Eliquis, migraine, arthritis, gout. 4/11, patient presented to the ED after a mechanical fall when he missed a step and fell onto his back with resultant severe back pain.  In the ED, CT spine noted T12/L1 level widening of the anterior disc space and mildly displaced fracture of the posterior superior endplate of L1. Admitted to [facility] Neurosurgery recommended surgical stabilization 4/16, patient underwent ORIF of the fracture, T11-L2 instrumented stabilization.   Assessment and Plan:  T12-L1 endplate spine fracture d/t mechanical fall S/p ORIF and instrumentation T11-L2 Currently in pain management with Tylenol as needed, oxycodone as needed With neurosurgery clearance, Eliquis was resumed on 4/19.   PT/OT eval obtained. CIR recommended but denied by insurance.  Await SNF   AKI on CKD 3a Contraction alkalosis Resolved with IV fluids and continue to hold the torsemide.     Hypernatremia sodium 142 from 147 his baseline creatinine is 1.2.   Suspect from free water deficit.  Sodium has improved to 142.     Chronic diastolic CHF ????Severe pulmonary hypertension with cor  pulmonale Chronic respiratory failure with hypoxia PTA on metoprolol 25 mg twice daily, torsemide 60-80 mg twice daily It seems patient was over diuresed and hence his creatinine and sodium level were elevated.  Diuretics were held and patient was given IV hydration for several days.   Per previous records, patient has severe pulmonary hypertension.  However echocardiogram obtained 4/22 showed normal EF, mild LVH and normal RV systolic function and size with moderately dilated right and left atrium.  No mention of severe pulmonary artery pressure.  Currently continued on metoprolol only.  Not on diuretics for last several days.   Follow-up with cardiology as an outpatient. Chronically on supplemental oxygen.  Was on 6 L at home.  Currently on 3 L only. Will continue to hold the torsemide for now.    Chronic atrial fibrillation  Rate well controlled.  Continue Cardizem 180 mg daily, metoprolol 25 mg twice daily. Continue anticoagulation with Eliquis   Morbid Obesity  Body mass index is 41.81 kg/m. Patient has been advised to make an attempt to improve diet and exercise patterns to aid in weight loss, once rehabilitation is done.    OHS/Sleep apnea Chronic hypoxic respiratory failure On [place] oxygen to keep sats greater than 92%.    Type 2 diabetes mellitus A1c 7 on 10/2022. PTA on metformin 1000 mg daily, glipizide 10 mg daily CBG (last 3)  Recent Labs    12/04/22 2145 12/05/22 0646 12/05/22 1225  GLUCAP 132* 111* 111*   Resume home meds on discharge.    HLD Lipitor   Gout Allopurinol   Constipation Continue Senokot scheduled  and as needed MiraLAX.     Impaired mobility  Was reportedly independent prior to presentation.  Mobility impaired currently because of vertebral fracture requiring surgery and prolonged hospitalization. PT/OT eval obtained.  CIR recommended.  Insurance denied.  SNF in process        Consultants: neurosurgery.  Procedures performed: none.   Disposition: Home Diet recommendation:  Carb modified diet DISCHARGE MEDICATION: Allergies as of 12/05/2022       Reactions   Codeine    Altered mental status "goofy"   Heparin Rash    Abdominal rash 01/2013        Medication List     STOP taking these medications    cetirizine 10 MG tablet Commonly known as: ZYRTEC   cyanocobalamin 1000 MCG/ML injection Commonly known as: VITAMIN B12   diltiazem 180 MG 24 hr capsule Commonly known as: TIAZAC   furosemide 20 MG tablet Commonly known as: Lasix   glucose blood test strip Commonly known as: ONE TOUCH ULTRA TEST   potassium chloride SA 20 MEQ tablet Commonly known as: KLOR-CON M   Shingrix injection Generic drug: Zoster Vaccine Adjuvanted   tizanidine 2 MG capsule Commonly known as: ZANAFLEX   torsemide 20 MG tablet Commonly known as: DEMADEX       TAKE these medications    allopurinol 100 MG tablet Commonly known as: ZYLOPRIM TAKE 1 TABLET DAILY   atorvastatin 20 MG tablet Commonly known as: LIPITOR TAKE 1 TABLET DAILY   cholecalciferol 25 MCG (1000 UNIT) tablet Commonly known as: VITAMIN D3 Take 2,000 Units by mouth daily.   diltiazem 180 MG 24 hr capsule Commonly known as: CARDIZEM CD 1 tab by mouth once daily   Eliquis 5 MG Tabs tablet Generic drug: apixaban TAKE 1 TABLET TWICE A DAY   glipiZIDE 10 MG 24 hr tablet Commonly known as: GLUCOTROL XL Take 1 tablet (10 mg total) by mouth daily with breakfast.   Lancets Misc Use as directed once daily  E11.9   metFORMIN 500 MG 24 hr tablet Commonly known as: GLUCOPHAGE-XR TAKE 2 TABLETS IN THE MORNING   metoprolol tartrate 25 MG tablet Commonly known as: LOPRESSOR TAKE 1 TABLET(25 MG) BY MOUTH TWICE DAILY   oxyCODONE 5 MG immediate release tablet Commonly known as: Oxy IR/ROXICODONE Take 1 tablet (5 mg total) by mouth every 6 (six) hours as needed for up to 3 days for moderate pain.   OXYGEN Inhale 3-5 L into the lungs daily. 3L  Daily  4L Resting  5L Exertion   senna-docusate 8.6-50 MG tablet Commonly known as: Senokot-S Take 1 tablet by mouth 2 (two) times daily.   Tart Cherry Advanced Caps Take 1 capsule by mouth daily.        Contact information for after-discharge care     Destination     [facility] .   Service: Skilled Nursing Contact information: [address] [place] [address] [phone]                    Discharge Exam: Ceasar Mons Weights   11/18/22 1104  Weight: 124.7 kg   General exam: Appears calm and comfortable  Respiratory system: Clear to auscultation. Respiratory effort normal. Cardiovascular system: S1 & S2 heard, RRR. No JVD, Gastrointestinal system: Abdomen is nondistended, soft and nontender.  Central nervous system: Alert and oriented to place and person  Extremities: trace edema.  Skin: No rashes,  Psychiatry:  Mood & affect appropriate.    Condition  at discharge: fair  The results of significant diagnostics from this hospitalization (including imaging, microbiology, ancillary and laboratory) are listed below for reference.   Imaging Studies: ECHOCARDIOGRAM COMPLETE  Result Date: 11/29/2022    ECHOCARDIOGRAM REPORT   Patient Name:   WASIF SIMONICH  Date of Exam: 11/29/2022 Medical Rec #:  [id number]  Height:       68.0 in Accession #:    [id number] Weight:       275.0 lb Date of Birth:  [birth year]   BSA:          2.340 m Patient Age:    76 years   BP:           119/75 mmHg Patient Gender: M          HR:           71 bpm. Exam Location:  Inpatient Procedure: 2D Echo, Cardiac Doppler, Color Doppler and Intracardiac            Opacification Agent Indications:    Abnormal ECG 794.31 / R94.31  History:        Patient has prior history of Echocardiogram examinations, most                 recent 07/22/2021. CHF, Pulmonary HTN, Arrythmias:Atrial                 Fibrillation; Risk Factors:Dyslipidemia, Diabetes and Former                 Smoker.   Sonographer:    Aron Baba Referring Phys: [id number] [facility]  Sonographer Comments: Technically difficult study due to poor echo windows. Image acquisition challenging due to patient body habitus and Image acquisition challenging due to respiratory motion. IMPRESSIONS  1. Left ventricular ejection fraction, by estimation, is 60 to 65%. The left ventricle has normal function. The left ventricle has no regional wall motion abnormalities. There is mild concentric left ventricular hypertrophy. Left ventricular diastolic function could not be evaluated.  2. Right ventricular systolic function is normal. The right ventricular size is normal.  3. Left atrial size was moderately dilated.  4. Right atrial size was moderately dilated.  5. The mitral valve was not well visualized. No evidence of mitral valve regurgitation. No evidence of mitral stenosis.  6. The aortic valve is normal in structure. Aortic valve regurgitation is not visualized. No aortic stenosis is present.  7. The inferior vena cava is dilated in size with >50% respiratory variability, suggesting right atrial pressure of 8 mmHg.  8. Technically very limited echo due to poor sound wave transmission. FINDINGS  Left Ventricle: Left ventricular ejection fraction, by estimation, is 60 to 65%. The left ventricle has normal function. The left ventricle has no regional wall motion abnormalities. Definity contrast agent was given IV to delineate the left ventricular  endocardial borders. The left ventricular internal cavity size was normal in size. There is mild concentric left ventricular hypertrophy. Left ventricular diastolic function could not be evaluated due to atrial fibrillation. Left ventricular diastolic function could not be evaluated. Right Ventricle: The right ventricular size is normal. No increase in right ventricular wall thickness. Right ventricular systolic function is normal. Left Atrium: Left atrial size was moderately dilated. Right Atrium:  Right atrial size was moderately dilated. Pericardium: There is no evidence of pericardial effusion. Mitral Valve: The mitral valve was not well visualized. No evidence of mitral valve regurgitation. No evidence of mitral valve stenosis. Tricuspid Valve: The tricuspid valve is  not well visualized. Tricuspid valve regurgitation is not demonstrated. No evidence of tricuspid stenosis. Aortic Valve: The aortic valve is normal in structure. Aortic valve regurgitation is not visualized. No aortic stenosis is present. Pulmonic Valve: The pulmonic valve was not well visualized. Pulmonic valve regurgitation is not visualized. No evidence of pulmonic stenosis. Aorta: The aortic root is normal in size and structure. Venous: The inferior vena cava is dilated in size with greater than 50% respiratory variability, suggesting right atrial pressure of 8 mmHg. IAS/Shunts: No atrial level shunt detected by color flow Doppler.  LEFT VENTRICLE PLAX 2D LVIDd:         4.60 cm   Diastology LVIDs:         3.10 cm   LV e' medial:    6.85 cm/s LV PW:         1.20 cm   LV E/e' medial:  14.6 LV IVS:        1.20 cm   LV e' lateral:   10.20 cm/s LVOT diam:     2.20 cm   LV E/e' lateral: 9.8 LV SV:         66 LV SV Index:   28 LVOT Area:     3.80 cm  RIGHT VENTRICLE RV S prime:     13.30 cm/s TAPSE (M-mode): 1.7 cm LEFT ATRIUM              Index        RIGHT ATRIUM           Index LA diam:        4.30 cm  1.84 cm/m   RA Area:     35.90 cm LA Vol (A2C):   165.0 ml 70.51 ml/m  RA Volume:   154.00 ml 65.81 ml/m LA Vol (A4C):   162.0 ml 69.23 ml/m LA Biplane Vol: 165.0 ml 70.51 ml/m  AORTIC VALVE LVOT Vmax:   89.20 cm/s LVOT Vmean:  62.550 cm/s LVOT VTI:    0.173 m  AORTA Ao Root diam: 3.70 cm Ao Asc diam:  3.60 cm MITRAL VALVE MV Area (PHT): 3.68 cm    SHUNTS MV Decel Time: 206 msec    Systemic VTI:  0.17 m MV E velocity: 99.80 cm/s  Systemic Diam: 2.20 cm Arvilla Meres MD Electronically signed by Arvilla Meres MD Signature Date/Time:  11/29/2022/4:30:59 PM    Final    DG Lumbar Spine 2-3 Views  Result Date: 11/23/2022 CLINICAL DATA:  Elective surgery.  T11-L2. EXAM: LUMBAR SPINE - 2-3 VIEW COMPARISON:  None Available. FINDINGS: Two fluoroscopic spot views of thoracolumbar spine obtained in the operating room. Four level posterior rod with intrapedicular screw fusion, exact levels difficult to delineate due to coned views. Fluoroscopy time 28 seconds. Dose 20.43 mGy. IMPRESSION: Fluoroscopic spot views for thoracolumbar fusion. Electronically Signed   By: Narda Rutherford M.D.   On: 11/23/2022 20:03   DG C-Arm 1-60 Min-No Report  Result Date: 11/23/2022 Fluoroscopy was utilized by the requesting physician.  No radiographic interpretation.   DG C-Arm 1-60 Min-No Report  Result Date: 11/23/2022 Fluoroscopy was utilized by the requesting physician.  No radiographic interpretation.   DG C-Arm 1-60 Min-No Report  Result Date: 11/23/2022 Fluoroscopy was utilized by the requesting physician.  No radiographic interpretation.   DG CHEST PORT 1 VIEW  Result Date: 11/22/2022 CLINICAL DATA:  Hypoxia. EXAM: PORTABLE CHEST 1 VIEW COMPARISON:  11/18/2022. FINDINGS: Heart is enlarged and the mediastinal contour is within normal limits. There is  atherosclerotic calcification of the aorta. Lung volumes are low with mild airspace disease at the lung bases. No definite effusion or pneumothorax. No acute osseous abnormality. IMPRESSION: 1. Mild airspace disease at the lung bases, possible atelectasis, edema, or infiltrate. 2. Cardiomegaly. Electronically Signed   By: Thornell Sartorius M.D.   On: 11/22/2022 04:39   DG Ankle 2 Views Right  Result Date: 11/20/2022 CLINICAL DATA:  Right ankle pain EXAM: RIGHT ANKLE - 2 VIEW COMPARISON:  None Available. FINDINGS: No fracture or dislocation is seen. The ankle mortise is intact. The base of the fifth metatarsal is unremarkable. Moderate plantar and posterior calcaneal enthesophytes. The visualized soft  tissues are unremarkable. IMPRESSION: Negative. Electronically Signed   By: Charline Bills M.D.   On: 11/20/2022 21:08   DG Chest 1 View  Result Date: 11/18/2022 CLINICAL DATA:  Fall EXAM: CHEST  1 VIEW COMPARISON:  10/08/2022 FINDINGS: Low lung volumes. Cardiomegaly. No acute airspace disease, pleural effusion or pneumothorax. IMPRESSION: Low lung volumes with cardiomegaly. No acute airspace disease. Electronically Signed   By: Jasmine Pang M.D.   On: 11/18/2022 15:19   CT Thoracic Spine Wo Contrast  Result Date: 11/18/2022 CLINICAL DATA:  Trauma EXAM: CT THORACIC, AND LUMBAR SPINE WITHOUT CONTRAST TECHNIQUE: Multidetector CT imaging of the thoracic and lumbar spine was performed without intravenous contrast. Multiplanar CT image reconstructions were also generated. RADIATION DOSE REDUCTION: This exam was performed according to the departmental dose-optimization program which includes automated exposure control, adjustment of the mA and/or kV according to patient size and/or use of iterative reconstruction technique. COMPARISON:  Lumbar spine radiograph 09/12/20 FINDINGS: CT THORACIC SPINE FINDINGS There is a distraction type injury at the T12 L1 level with widening of the anterior disc space (series 9, image 39) and mildly displaced fractures of the posterosuperior endplate of L1 (series 9, image 40) and possibly the anterior inferior endplate T12 (series 9, image 37). There is also a nondisplaced fracture through L1 spinous process (series 7, image 49). There is prevertebral soft tissue stranding along the anterior margin of the T12-L1 disc space, compatible with posttraumatic changes. Alignment: Normal. Vertebrae: See above Paraspinal and other soft tissues: See above Disc levels: No evidence of high-grade spinal canal stenosis. CT LUMBAR SPINE FINDINGS Segmentation: 5 lumbar type vertebrae. Alignment: Normal.  See above for description of T12-L1 disc space Vertebrae: No acute fracture or focal  pathologic process.  See above Paraspinal and other soft tissues: Negative. Disc levels: No evidence of high-grade spinal canal stenosis. Mild-to-moderate bilateral neural foraminal narrowing at L4-L5. IMPRESSION: 1. Three column distraction type injury at T12-L1 level with widening of the anterior disc space and mildly displaced fractures of the posterosuperior endplate of L1 and possibly the anterior inferior endplate of T12. Nondisplaced fracture through the L1 spinous process. 2. Prevertebral soft tissue stranding along the anterior margin of the T12-L1 disc space, compatible with posttraumatic changes. Electronically Signed   By: Lorenza Cambridge M.D.   On: 11/18/2022 11:58   CT Lumbar Spine Wo Contrast  Result Date: 11/18/2022 CLINICAL DATA:  Trauma EXAM: CT THORACIC, AND LUMBAR SPINE WITHOUT CONTRAST TECHNIQUE: Multidetector CT imaging of the thoracic and lumbar spine was performed without intravenous contrast. Multiplanar CT image reconstructions were also generated. RADIATION DOSE REDUCTION: This exam was performed according to the departmental dose-optimization program which includes automated exposure control, adjustment of the mA and/or kV according to patient size and/or use of iterative reconstruction technique. COMPARISON:  Lumbar spine radiograph 09/12/20 FINDINGS: CT THORACIC SPINE FINDINGS  There is a distraction type injury at the T12 L1 level with widening of the anterior disc space (series 9, image 39) and mildly displaced fractures of the posterosuperior endplate of L1 (series 9, image 40) and possibly the anterior inferior endplate T12 (series 9, image 37). There is also a nondisplaced fracture through L1 spinous process (series 7, image 49). There is prevertebral soft tissue stranding along the anterior margin of the T12-L1 disc space, compatible with posttraumatic changes. Alignment: Normal. Vertebrae: See above Paraspinal and other soft tissues: See above Disc levels: No evidence of  high-grade spinal canal stenosis. CT LUMBAR SPINE FINDINGS Segmentation: 5 lumbar type vertebrae. Alignment: Normal.  See above for description of T12-L1 disc space Vertebrae: No acute fracture or focal pathologic process.  See above Paraspinal and other soft tissues: Negative. Disc levels: No evidence of high-grade spinal canal stenosis. Mild-to-moderate bilateral neural foraminal narrowing at L4-L5. IMPRESSION: 1. Three column distraction type injury at T12-L1 level with widening of the anterior disc space and mildly displaced fractures of the posterosuperior endplate of L1 and possibly the anterior inferior endplate of T12. Nondisplaced fracture through the L1 spinous process. 2. Prevertebral soft tissue stranding along the anterior margin of the T12-L1 disc space, compatible with posttraumatic changes. Electronically Signed   By: Lorenza Cambridge M.D.   On: 11/18/2022 11:58   CT Head Wo Contrast  Result Date: 11/18/2022 CLINICAL DATA:  Head trauma, minor (Age >= 65y) EXAM: CT HEAD WITHOUT CONTRAST TECHNIQUE: Contiguous axial images were obtained from the base of the skull through the vertex without intravenous contrast. RADIATION DOSE REDUCTION: This exam was performed according to the departmental dose-optimization program which includes automated exposure control, adjustment of the mA and/or kV according to patient size and/or use of iterative reconstruction technique. COMPARISON:  None Available. FINDINGS: Brain: No evidence of acute infarction, hemorrhage, hydrocephalus, extra-axial collection or mass lesion/mass effect. Patchy white matter hypodensities, nonspecific but compatible with chronic microvascular ischemic disease. Vascular: No hyperdense vessel.  Calcific atherosclerosis. Skull: No acute fracture. Sinuses/Orbits: Clear sinuses.  No acute focal findings. Other: No mastoid effusions. IMPRESSION: No evidence of acute intracranial abnormality. Electronically Signed   By: Feliberto Harts M.D.   On:  11/18/2022 11:42    Microbiology: Results for orders placed or performed during the hospital encounter of 11/18/22  MRSA Next Gen by PCR, Nasal     Status: None   Collection Time: 11/22/22 12:31 PM   Specimen: Nasal Mucosa; Nasal Swab  Result Value Ref Range Status   MRSA by PCR Next Gen NOT DETECTED NOT DETECTED Final    Comment: (NOTE) The GeneXpert MRSA Assay (FDA approved for NASAL specimens only), is one component of a comprehensive MRSA colonization surveillance program. It is not intended to diagnose MRSA infection nor to guide or monitor treatment for MRSA infections. Test performance is not FDA approved in patients less than 70 years old. Performed at [facility], [address], [place], [place] [address]   Surgical PCR screen     Status: None   Collection Time: 11/23/22  8:52 PM   Specimen: Nasal Mucosa; Nasal Swab  Result Value Ref Range Status   MRSA, PCR NEGATIVE NEGATIVE Final   Staphylococcus aureus NEGATIVE NEGATIVE Final    Comment: (NOTE) The Xpert SA Assay (FDA approved for NASAL specimens in patients 36 years of age and older), is one component of a comprehensive surveillance program. It is not intended to diagnose infection nor to guide or monitor treatment. Performed  at [facility], [address], [place], [place] [address]     Labs: CBC: No results for input(s): "WBC", "NEUTROABS", "HGB", "HCT", "MCV", "PLT" in the last 168 hours. Basic Metabolic Panel: Recent Labs  Lab 11/29/22 0708 11/30/22 0458 12/01/22 0351 12/03/22 0648  NA 145 146* 147* 142  K 3.4* 3.5 3.8 3.8  CL 102 108 106 104  CO2 33* 33* 30 29  GLUCOSE 164* 129* 117* 134*  BUN 21 17 23 14   CREATININE 1.22 1.26* 1.40* 1.21  CALCIUM 9.7 9.6 9.8 9.6   Liver Function Tests: No results for input(s): "AST", "ALT", "ALKPHOS", "BILITOT", "PROT", "ALBUMIN" in the last 168 hours. CBG: Recent Labs  Lab 12/04/22 0630 12/04/22 1219 12/04/22 2145 12/05/22 0646  12/05/22 1225  GLUCAP 130* 124* 132* 111* 111*    Discharge time spent: 42 minutes.   Signed: Kathlen Mody, MD [facility] 12/05/2022

## 2022-12-06 DIAGNOSIS — R6889 Other general symptoms and signs: Secondary | ICD-10-CM | POA: Diagnosis not present

## 2022-12-06 DIAGNOSIS — J9610 Chronic respiratory failure, unspecified whether with hypoxia or hypercapnia: Secondary | ICD-10-CM | POA: Diagnosis not present

## 2022-12-06 DIAGNOSIS — I5032 Chronic diastolic (congestive) heart failure: Secondary | ICD-10-CM | POA: Diagnosis not present

## 2022-12-06 DIAGNOSIS — I482 Chronic atrial fibrillation, unspecified: Secondary | ICD-10-CM | POA: Diagnosis not present

## 2022-12-06 DIAGNOSIS — I87333 Chronic venous hypertension (idiopathic) with ulcer and inflammation of bilateral lower extremity: Secondary | ICD-10-CM | POA: Diagnosis not present

## 2022-12-06 DIAGNOSIS — N189 Chronic kidney disease, unspecified: Secondary | ICD-10-CM | POA: Diagnosis not present

## 2022-12-06 DIAGNOSIS — L89313 Pressure ulcer of right buttock, stage 3: Secondary | ICD-10-CM | POA: Diagnosis not present

## 2022-12-06 DIAGNOSIS — Z743 Need for continuous supervision: Secondary | ICD-10-CM | POA: Diagnosis not present

## 2022-12-06 DIAGNOSIS — S22088D Other fracture of T11-T12 vertebra, subsequent encounter for fracture with routine healing: Secondary | ICD-10-CM | POA: Diagnosis not present

## 2022-12-06 DIAGNOSIS — Z48811 Encounter for surgical aftercare following surgery on the nervous system: Secondary | ICD-10-CM | POA: Diagnosis not present

## 2022-12-06 DIAGNOSIS — Z4789 Encounter for other orthopedic aftercare: Secondary | ICD-10-CM | POA: Diagnosis not present

## 2022-12-06 DIAGNOSIS — I272 Pulmonary hypertension, unspecified: Secondary | ICD-10-CM

## 2022-12-06 DIAGNOSIS — S32018G Other fracture of first lumbar vertebra, subsequent encounter for fracture with delayed healing: Secondary | ICD-10-CM | POA: Diagnosis not present

## 2022-12-06 DIAGNOSIS — S22089D Unspecified fracture of T11-T12 vertebra, subsequent encounter for fracture with routine healing: Secondary | ICD-10-CM | POA: Diagnosis not present

## 2022-12-06 DIAGNOSIS — S22089A Unspecified fracture of T11-T12 vertebra, initial encounter for closed fracture: Secondary | ICD-10-CM | POA: Diagnosis not present

## 2022-12-06 DIAGNOSIS — E1165 Type 2 diabetes mellitus with hyperglycemia: Secondary | ICD-10-CM | POA: Diagnosis not present

## 2022-12-06 DIAGNOSIS — I10 Essential (primary) hypertension: Secondary | ICD-10-CM | POA: Diagnosis not present

## 2022-12-06 DIAGNOSIS — S32029A Unspecified fracture of second lumbar vertebra, initial encounter for closed fracture: Secondary | ICD-10-CM

## 2022-12-06 DIAGNOSIS — S32028D Other fracture of second lumbar vertebra, subsequent encounter for fracture with routine healing: Secondary | ICD-10-CM | POA: Diagnosis not present

## 2022-12-06 DIAGNOSIS — E119 Type 2 diabetes mellitus without complications: Secondary | ICD-10-CM | POA: Diagnosis not present

## 2022-12-06 DIAGNOSIS — S32019D Unspecified fracture of first lumbar vertebra, subsequent encounter for fracture with routine healing: Secondary | ICD-10-CM | POA: Diagnosis not present

## 2022-12-06 DIAGNOSIS — Z7401 Bed confinement status: Secondary | ICD-10-CM | POA: Diagnosis not present

## 2022-12-06 DIAGNOSIS — I509 Heart failure, unspecified: Secondary | ICD-10-CM | POA: Diagnosis not present

## 2022-12-06 DIAGNOSIS — N179 Acute kidney failure, unspecified: Secondary | ICD-10-CM | POA: Diagnosis not present

## 2022-12-06 LAB — BASIC METABOLIC PANEL
Anion gap: 9 (ref 5–15)
BUN: 12 mg/dL (ref 8–23)
CO2: 29 mmol/L (ref 22–32)
Calcium: 9.6 mg/dL (ref 8.9–10.3)
Chloride: 103 mmol/L (ref 98–111)
Creatinine, Ser: 1.16 mg/dL (ref 0.61–1.24)
GFR, Estimated: >60 mL/min (ref >60)
Glucose, Bld: 121 mg/dL — ABNORMAL HIGH (ref 70–99)
Potassium: 3.7 mmol/L (ref 3.5–5.1)
Sodium: 141 mmol/L (ref 135–145)

## 2022-12-06 LAB — GLUCOSE, CAPILLARY
Glucose-Capillary: 113 mg/dL — ABNORMAL HIGH (ref 70–99)
Glucose-Capillary: 121 mg/dL — ABNORMAL HIGH (ref 70–99)

## 2022-12-06 MED ORDER — OXYCODONE HCL 5 MG PO TABS: 5.0000 mg | ORAL_TABLET | Freq: Four times a day (QID) | ORAL | 0 refills | Status: AC | PRN

## 2022-12-06 MED ORDER — TORSEMIDE 20 MG PO TABS: 40.0000 mg | ORAL_TABLET | Freq: Two times a day (BID) | ORAL | 5 refills | Status: DC

## 2022-12-06 NOTE — Care Management Important Message (Signed)
Important Message  Patient Details  Name: [name] [name] MRN: [id number] Date of Birth: 1945/12/12   Medicare Important Message Given:  Yes  Patient left prior to IM delivery will mail a copy to the patients home address.     Jannice Beitzel 12/06/2022, 2:35 PM

## 2022-12-06 NOTE — TOC Transition Note (Signed)
Transition of Care [facility]) - CM/SW Discharge Note   Patient Details  Name: [name] [name] MRN: [id number] Date of Birth: 1945/11/08  [facility] CM/SW Contact:  Erin Sons, LCSW Phone Number: 12/06/2022, 12:49 PM   Clinical Narrative:     Daughter will bring home BIPAP to SNF Auth approved 4/27- 4/30 Auth# [id number]  [facility] Anticipated DC date: 12/06/22 Family notified: daughter Transport by: Sharin Mons   Per MD patient ready for DC to [facility]. RN, patient, patient's family, and facility notified of DC. Discharge Summary and FL2 sent to facility. RN to call report prior to discharge [phone].). DC packet on chart. Ambulance transport requested for patient.   CSW will sign off for now as social work intervention is no longer needed. Please consult [place] again if new needs arise.    Final next level of care: Skilled Nursing Facility Barriers to Discharge: No Barriers Identified   Patient Goals and CMS Choice      Discharge Placement                [facility] Patient to be transferred to facility by: [facility] Name of family member notified: daughter Patient and family notified of of transfer: 12/06/22  Discharge Plan and Services Additional resources added to the After Visit Summary for     Discharge Planning Services: CM Consult                                 Social Determinants of Health (SDOH) Interventions SDOH Screenings   Food Insecurity: No Food Insecurity (11/25/2022)  Housing: Low Risk  (11/25/2022)  Transportation Needs: No Transportation Needs (11/25/2022)  Utilities: Not At Risk (11/25/2022)  Depression (PHQ2-9): Low Risk  (10/08/2022)  Tobacco Use: Medium Risk (11/25/2022)     Readmission Risk Interventions     No data to display

## 2022-12-06 NOTE — Discharge Summary (Signed)
Physician Discharge Summary  [name] [name] ZOX:[id number] DOB: 02/19/1946 DOA: 11/18/2022  PCP: Corwin Levins, MD  Admit date: 11/18/2022 Discharge date: 12/06/2022 Admitted From: Home Disposition:  SNF Recommendations for Outpatient Follow-up:  Follow up with neurosurgery in 2 to 3 weeks Check fluid status, blood pressure, CMP and CBC in 1 to 2 weeks Please follow up on the following pending results: None   Discharge Condition: Stable CODE STATUS: Full code  Contact information for follow-up providers     Lisbeth Renshaw, MD. Schedule an appointment as soon as possible for a visit in 2 week(s).   Specialty: Neurosurgery Contact information: [address] [place] [address] [phone]              Contact information for after-discharge care     Destination     [facility] .   Service: Skilled Nursing Contact information: [address] [place] [address] [phone]                     Hospital course 77 year old M with PMH of morbid obesity, OSA, OHS, COPD, severe PAH, diastolic CHF, right HF, chronic hypoxic RF on 3 to 5L, DM2, HTN, HLD,  permanent A-fib on Eliquis, migraine, arthritis and gout presented to ED on 11/18/2022 with severe back pain after accidental fall when he missed a step and fell onto his back. In the ED, CT spine noted T12/L1 level widening of the anterior disc space and mildly displaced fracture of the posterior superior endplate of L1.  Neurosurgery consulted.  Patient underwent ORIF of the Neurosurgery recommended surgical stabilization 4/16, patient underwent ORIF of the T12-L1 fracture with posterior instrumented segmental stabilization of T11-L2 using Medtronic pedicle screw/rod by Dr. Hedy Jacob.  No significant postoperative complication other than some AKI on CKD-3A, contraction alkalosis and hypernatremia that have resolved prior to discharge.  Therapy recommended CIR but insurance  denied.  He is discharged to SNF.  Outpatient follow-up with neurosurgery in 2 to 3 weeks.  See individual problem list below for more.   Problems addressed during this hospitalization Principal Problem:   L1 vertebral fracture (HCC) Active Problems:   Pulmonary hypertension (HCC)   Closed L2 vertebral fracture (HCC)   Chronic a-fib (HCC)   Closed fracture of eleventh thoracic vertebra (HCC)   T12-L1 endplate spine fracture d/t mechanical fall: S/p ORIF and instrumentation T11-L2 on 4/16 -Tylenol and oxycodone as needed for pain control -Outpatient follow-up with neurosurgery -Continue PT/OT at SNF  AKI on CKD 3a/contraction alkalosis/hypernatremia: Resolved -Recheck renal function in 1 to 2 weeks.   Chronic diastolic CHF/severe PAH/chronic right heart failure: Patient was on torsemide 60 mg twice daily  alternating with 80 mg twice daily prior to admission.  Torsemide held in the setting of AKI hyponatremia and contraction alkalosis.  TTE this hospitalization showed normal EF with mild LVH, normal RV with moderately dilated right and left atrium but no mention of severe PAH. -Resume torsemide at 40 mg twice daily and adjust as appropriate  Chronic atrial fibrillation: Rate controlled. -Continue Cardizem 180 mg daily, metoprolol 25 mg twice daily. -Continue anticoagulation with Eliquis   OHS/Sleep apnea/Chronic hypoxic respiratory failure: On 3 to 6 L at home.  Currently stable on 3 L. -Minimum oxygen to keep saturation above 90%.   Controlled NIDDM-2 with hyperglycemia, hyperlipidemia and CKD-3A: A1c 7% -Continue home meds.   Gout -Allopurinol   Constipation -Continue Senokot scheduled and as needed MiraLAX.  Impaired mobility : Was reportedly independent prior to presentation.  Mobility impaired currently because of vertebral fracture requiring surgery and prolonged hospitalization. PT/OT eval obtained.  CIR recommended.  Insurance denied.  SNF in process  Morbid  obesity Body mass index is 41.81 kg/m. -Encourage lifestyle change -Consider GLP-1 agonist  Pressure skin injury: Pressure Injury 12/05/22 Buttocks Right Stage 2 -  Partial thickness loss of dermis presenting as a shallow open injury with a red, pink wound bed without slough. (Active)  12/05/22 0800  Location: Buttocks  Location Orientation: Right  Staging: Stage 2 -  Partial thickness loss of dermis presenting as a shallow open injury with a red, pink wound bed without slough.  Wound Description (Comments):   Present on Admission: No  Dressing Type Foam - Lift dressing to assess site every shift 12/05/22 2000    Time spent 45 minutes  Vital signs Vitals:   12/05/22 1900 12/05/22 2000 12/05/22 2300 12/06/22 0400  BP:  139/88 134/82 126/74  Pulse:  76 77 70  Temp: (!) 97.5 F (36.4 C) (!) 97.5 F (36.4 C) 98 F (36.7 C) 98.3 F (36.8 C)  Resp:  18 18 16   Height:      Weight:      SpO2:  94% 93%   TempSrc: Oral Oral Oral Oral  BMI (Calculated):         Discharge exam  GENERAL: No apparent distress.  Nontoxic. HEENT: MMM.  Vision and hearing grossly intact.  NECK: Supple.  No apparent JVD.  RESP:  No IWOB.  Fair aeration bilaterally. CVS:  RRR. Heart sounds normal.  ABD/GI/GU: BS+. Abd soft, NTND.  MSK/EXT:  Moves extremities but weak bilaterally  SKIN: BLE skin changes of stasis dermatitis NEURO: Awake and alert. Oriented appropriately.  No apparent focal neuro deficit. PSYCH: Calm. Normal affect.   Discharge Instructions  Allergies as of 12/06/2022       Reactions   Codeine    Altered mental status "goofy"   Heparin Rash    Abdominal rash 01/2013        Medication List     STOP taking these medications    cetirizine 10 MG tablet Commonly known as: ZYRTEC   cyanocobalamin 1000 MCG/ML injection Commonly known as: VITAMIN B12   diltiazem 180 MG 24 hr capsule Commonly known as: TIAZAC   furosemide 20 MG tablet Commonly known as: Lasix   glucose  blood test strip Commonly known as: ONE TOUCH ULTRA TEST   potassium chloride SA 20 MEQ tablet Commonly known as: KLOR-CON M   Shingrix injection Generic drug: Zoster Vaccine Adjuvanted   tizanidine 2 MG capsule Commonly known as: ZANAFLEX       TAKE these medications    allopurinol 100 MG tablet Commonly known as: ZYLOPRIM TAKE 1 TABLET DAILY   atorvastatin 20 MG tablet Commonly known as: LIPITOR TAKE 1 TABLET DAILY   cholecalciferol 25 MCG (1000 UNIT) tablet Commonly known as: VITAMIN D3 Take 2,000 Units by mouth daily.   diltiazem 180 MG 24 hr capsule Commonly known as: CARDIZEM CD 1 tab by mouth once daily   Eliquis 5 MG Tabs tablet Generic drug: apixaban TAKE 1 TABLET TWICE A DAY   glipiZIDE 10 MG 24 hr tablet Commonly known as: GLUCOTROL XL Take 1 tablet (10 mg total) by mouth daily with breakfast.   Lancets Misc Use as directed once daily  E11.9   metFORMIN 500 MG 24 hr tablet Commonly known as: GLUCOPHAGE-XR TAKE 2 TABLETS IN  THE MORNING   metoprolol tartrate 25 MG tablet Commonly known as: LOPRESSOR TAKE 1 TABLET(25 MG) BY MOUTH TWICE DAILY   oxyCODONE 5 MG immediate release tablet Commonly known as: Oxy IR/ROXICODONE Take 1 tablet (5 mg total) by mouth every 6 (six) hours as needed for up to 3 days for moderate pain.   OXYGEN Inhale 3-5 L into the lungs daily. 3L Daily  4L Resting  5L Exertion   senna-docusate 8.6-50 MG tablet Commonly known as: Senokot-S Take 1 tablet by mouth 2 (two) times daily.   Tart Cherry Advanced Caps Take 1 capsule by mouth daily.   torsemide 20 MG tablet Commonly known as: DEMADEX Take 2 tablets (40 mg total) by mouth 2 (two) times daily. What changed:  how much to take how to take this when to take this additional instructions        Consultations: Neurosurgery  Procedures/Studies: 4/16-ORIF of the T12-L1 fracture with posterior instrumented segmental stabilization of T11-L2 using Medtronic  pedicle screw/rod by Dr. Hedy Jacob   ECHOCARDIOGRAM COMPLETE  Result Date: 11/29/2022    ECHOCARDIOGRAM REPORT   Patient Name:   OLAMIDE CARATTINI  Date of Exam: 11/29/2022 Medical Rec #:  [id number]  Height:       68.0 in Accession #:    [id number] Weight:       275.0 lb Date of Birth:  March 25, 1946   BSA:          2.340 m Patient Age:    76 years   BP:           119/75 mmHg Patient Gender: M          HR:           71 bpm. Exam Location:  Inpatient Procedure: 2D Echo, Cardiac Doppler, Color Doppler and Intracardiac            Opacification Agent Indications:    Abnormal ECG 794.31 / R94.31  History:        Patient has prior history of Echocardiogram examinations, most                 recent 07/22/2021. CHF, Pulmonary HTN, Arrythmias:Atrial                 Fibrillation; Risk Factors:Dyslipidemia, Diabetes and Former                 Smoker.  Sonographer:    Aron Baba Referring Phys: [id number] [facility]  Sonographer Comments: Technically difficult study due to poor echo windows. Image acquisition challenging due to patient body habitus and Image acquisition challenging due to respiratory motion. IMPRESSIONS  1. Left ventricular ejection fraction, by estimation, is 60 to 65%. The left ventricle has normal function. The left ventricle has no regional wall motion abnormalities. There is mild concentric left ventricular hypertrophy. Left ventricular diastolic function could not be evaluated.  2. Right ventricular systolic function is normal. The right ventricular size is normal.  3. Left atrial size was moderately dilated.  4. Right atrial size was moderately dilated.  5. The mitral valve was not well visualized. No evidence of mitral valve regurgitation. No evidence of mitral stenosis.  6. The aortic valve is normal in structure. Aortic valve regurgitation is not visualized. No aortic stenosis is present.  7. The inferior vena cava is dilated in size with >50% respiratory variability, suggesting right atrial pressure of 8  mmHg.  8. Technically very limited echo due to poor sound wave transmission. FINDINGS  Left  Ventricle: Left ventricular ejection fraction, by estimation, is 60 to 65%. The left ventricle has normal function. The left ventricle has no regional wall motion abnormalities. Definity contrast agent was given IV to delineate the left ventricular  endocardial borders. The left ventricular internal cavity size was normal in size. There is mild concentric left ventricular hypertrophy. Left ventricular diastolic function could not be evaluated due to atrial fibrillation. Left ventricular diastolic function could not be evaluated. Right Ventricle: The right ventricular size is normal. No increase in right ventricular wall thickness. Right ventricular systolic function is normal. Left Atrium: Left atrial size was moderately dilated. Right Atrium: Right atrial size was moderately dilated. Pericardium: There is no evidence of pericardial effusion. Mitral Valve: The mitral valve was not well visualized. No evidence of mitral valve regurgitation. No evidence of mitral valve stenosis. Tricuspid Valve: The tricuspid valve is not well visualized. Tricuspid valve regurgitation is not demonstrated. No evidence of tricuspid stenosis. Aortic Valve: The aortic valve is normal in structure. Aortic valve regurgitation is not visualized. No aortic stenosis is present. Pulmonic Valve: The pulmonic valve was not well visualized. Pulmonic valve regurgitation is not visualized. No evidence of pulmonic stenosis. Aorta: The aortic root is normal in size and structure. Venous: The inferior vena cava is dilated in size with greater than 50% respiratory variability, suggesting right atrial pressure of 8 mmHg. IAS/Shunts: No atrial level shunt detected by color flow Doppler.  LEFT VENTRICLE PLAX 2D LVIDd:         4.60 cm   Diastology LVIDs:         3.10 cm   LV e' medial:    6.85 cm/s LV PW:         1.20 cm   LV E/e' medial:  14.6 LV IVS:        1.20 cm    LV e' lateral:   10.20 cm/s LVOT diam:     2.20 cm   LV E/e' lateral: 9.8 LV SV:         66 LV SV Index:   28 LVOT Area:     3.80 cm  RIGHT VENTRICLE RV S prime:     13.30 cm/s TAPSE (M-mode): 1.7 cm LEFT ATRIUM              Index        RIGHT ATRIUM           Index LA diam:        4.30 cm  1.84 cm/m   RA Area:     35.90 cm LA Vol (A2C):   165.0 ml 70.51 ml/m  RA Volume:   154.00 ml 65.81 ml/m LA Vol (A4C):   162.0 ml 69.23 ml/m LA Biplane Vol: 165.0 ml 70.51 ml/m  AORTIC VALVE LVOT Vmax:   89.20 cm/s LVOT Vmean:  62.550 cm/s LVOT VTI:    0.173 m  AORTA Ao Root diam: 3.70 cm Ao Asc diam:  3.60 cm MITRAL VALVE MV Area (PHT): 3.68 cm    SHUNTS MV Decel Time: 206 msec    Systemic VTI:  0.17 m MV E velocity: 99.80 cm/s  Systemic Diam: 2.20 cm Arvilla Meres MD Electronically signed by Arvilla Meres MD Signature Date/Time: 11/29/2022/4:30:59 PM    Final    DG Lumbar Spine 2-3 Views  Result Date: 11/23/2022 CLINICAL DATA:  Elective surgery.  T11-L2. EXAM: LUMBAR SPINE - 2-3 VIEW COMPARISON:  None Available. FINDINGS: Two fluoroscopic spot views of thoracolumbar spine obtained in the operating  room. Four level posterior rod with intrapedicular screw fusion, exact levels difficult to delineate due to coned views. Fluoroscopy time 28 seconds. Dose 20.43 mGy. IMPRESSION: Fluoroscopic spot views for thoracolumbar fusion. Electronically Signed   By: Narda Rutherford M.D.   On: 11/23/2022 20:03   DG C-Arm 1-60 Min-No Report  Result Date: 11/23/2022 Fluoroscopy was utilized by the requesting physician.  No radiographic interpretation.   DG C-Arm 1-60 Min-No Report  Result Date: 11/23/2022 Fluoroscopy was utilized by the requesting physician.  No radiographic interpretation.   DG C-Arm 1-60 Min-No Report  Result Date: 11/23/2022 Fluoroscopy was utilized by the requesting physician.  No radiographic interpretation.   DG CHEST PORT 1 VIEW  Result Date: 11/22/2022 CLINICAL DATA:  Hypoxia. EXAM:  PORTABLE CHEST 1 VIEW COMPARISON:  11/18/2022. FINDINGS: Heart is enlarged and the mediastinal contour is within normal limits. There is atherosclerotic calcification of the aorta. Lung volumes are low with mild airspace disease at the lung bases. No definite effusion or pneumothorax. No acute osseous abnormality. IMPRESSION: 1. Mild airspace disease at the lung bases, possible atelectasis, edema, or infiltrate. 2. Cardiomegaly. Electronically Signed   By: Thornell Sartorius M.D.   On: 11/22/2022 04:39   DG Ankle 2 Views Right  Result Date: 11/20/2022 CLINICAL DATA:  Right ankle pain EXAM: RIGHT ANKLE - 2 VIEW COMPARISON:  None Available. FINDINGS: No fracture or dislocation is seen. The ankle mortise is intact. The base of the fifth metatarsal is unremarkable. Moderate plantar and posterior calcaneal enthesophytes. The visualized soft tissues are unremarkable. IMPRESSION: Negative. Electronically Signed   By: Charline Bills M.D.   On: 11/20/2022 21:08   DG Chest 1 View  Result Date: 11/18/2022 CLINICAL DATA:  Fall EXAM: CHEST  1 VIEW COMPARISON:  10/08/2022 FINDINGS: Low lung volumes. Cardiomegaly. No acute airspace disease, pleural effusion or pneumothorax. IMPRESSION: Low lung volumes with cardiomegaly. No acute airspace disease. Electronically Signed   By: Jasmine Pang M.D.   On: 11/18/2022 15:19   CT Thoracic Spine Wo Contrast  Result Date: 11/18/2022 CLINICAL DATA:  Trauma EXAM: CT THORACIC, AND LUMBAR SPINE WITHOUT CONTRAST TECHNIQUE: Multidetector CT imaging of the thoracic and lumbar spine was performed without intravenous contrast. Multiplanar CT image reconstructions were also generated. RADIATION DOSE REDUCTION: This exam was performed according to the departmental dose-optimization program which includes automated exposure control, adjustment of the mA and/or kV according to patient size and/or use of iterative reconstruction technique. COMPARISON:  Lumbar spine radiograph 09/12/20 FINDINGS: CT  THORACIC SPINE FINDINGS There is a distraction type injury at the T12 L1 level with widening of the anterior disc space (series 9, image 39) and mildly displaced fractures of the posterosuperior endplate of L1 (series 9, image 40) and possibly the anterior inferior endplate T12 (series 9, image 37). There is also a nondisplaced fracture through L1 spinous process (series 7, image 49). There is prevertebral soft tissue stranding along the anterior margin of the T12-L1 disc space, compatible with posttraumatic changes. Alignment: Normal. Vertebrae: See above Paraspinal and other soft tissues: See above Disc levels: No evidence of high-grade spinal canal stenosis. CT LUMBAR SPINE FINDINGS Segmentation: 5 lumbar type vertebrae. Alignment: Normal.  See above for description of T12-L1 disc space Vertebrae: No acute fracture or focal pathologic process.  See above Paraspinal and other soft tissues: Negative. Disc levels: No evidence of high-grade spinal canal stenosis. Mild-to-moderate bilateral neural foraminal narrowing at L4-L5. IMPRESSION: 1. Three column distraction type injury at T12-L1 level with widening of the anterior  disc space and mildly displaced fractures of the posterosuperior endplate of L1 and possibly the anterior inferior endplate of T12. Nondisplaced fracture through the L1 spinous process. 2. Prevertebral soft tissue stranding along the anterior margin of the T12-L1 disc space, compatible with posttraumatic changes. Electronically Signed   By: Lorenza Cambridge M.D.   On: 11/18/2022 11:58   CT Lumbar Spine Wo Contrast  Result Date: 11/18/2022 CLINICAL DATA:  Trauma EXAM: CT THORACIC, AND LUMBAR SPINE WITHOUT CONTRAST TECHNIQUE: Multidetector CT imaging of the thoracic and lumbar spine was performed without intravenous contrast. Multiplanar CT image reconstructions were also generated. RADIATION DOSE REDUCTION: This exam was performed according to the departmental dose-optimization program which  includes automated exposure control, adjustment of the mA and/or kV according to patient size and/or use of iterative reconstruction technique. COMPARISON:  Lumbar spine radiograph 09/12/20 FINDINGS: CT THORACIC SPINE FINDINGS There is a distraction type injury at the T12 L1 level with widening of the anterior disc space (series 9, image 39) and mildly displaced fractures of the posterosuperior endplate of L1 (series 9, image 40) and possibly the anterior inferior endplate T12 (series 9, image 37). There is also a nondisplaced fracture through L1 spinous process (series 7, image 49). There is prevertebral soft tissue stranding along the anterior margin of the T12-L1 disc space, compatible with posttraumatic changes. Alignment: Normal. Vertebrae: See above Paraspinal and other soft tissues: See above Disc levels: No evidence of high-grade spinal canal stenosis. CT LUMBAR SPINE FINDINGS Segmentation: 5 lumbar type vertebrae. Alignment: Normal.  See above for description of T12-L1 disc space Vertebrae: No acute fracture or focal pathologic process.  See above Paraspinal and other soft tissues: Negative. Disc levels: No evidence of high-grade spinal canal stenosis. Mild-to-moderate bilateral neural foraminal narrowing at L4-L5. IMPRESSION: 1. Three column distraction type injury at T12-L1 level with widening of the anterior disc space and mildly displaced fractures of the posterosuperior endplate of L1 and possibly the anterior inferior endplate of T12. Nondisplaced fracture through the L1 spinous process. 2. Prevertebral soft tissue stranding along the anterior margin of the T12-L1 disc space, compatible with posttraumatic changes. Electronically Signed   By: Lorenza Cambridge M.D.   On: 11/18/2022 11:58   CT Head Wo Contrast  Result Date: 11/18/2022 CLINICAL DATA:  Head trauma, minor (Age >= 65y) EXAM: CT HEAD WITHOUT CONTRAST TECHNIQUE: Contiguous axial images were obtained from the base of the skull through the vertex  without intravenous contrast. RADIATION DOSE REDUCTION: This exam was performed according to the departmental dose-optimization program which includes automated exposure control, adjustment of the mA and/or kV according to patient size and/or use of iterative reconstruction technique. COMPARISON:  None Available. FINDINGS: Brain: No evidence of acute infarction, hemorrhage, hydrocephalus, extra-axial collection or mass lesion/mass effect. Patchy white matter hypodensities, nonspecific but compatible with chronic microvascular ischemic disease. Vascular: No hyperdense vessel.  Calcific atherosclerosis. Skull: No acute fracture. Sinuses/Orbits: Clear sinuses.  No acute focal findings. Other: No mastoid effusions. IMPRESSION: No evidence of acute intracranial abnormality. Electronically Signed   By: Feliberto Harts M.D.   On: 11/18/2022 11:42       The results of significant diagnostics from this hospitalization (including imaging, microbiology, ancillary and laboratory) are listed below for reference.     Microbiology: No results found for this or any previous visit (from the past 240 hour(s)).   Labs:  CBC: No results for input(s): "WBC", "NEUTROABS", "HGB", "HCT", "MCV", "PLT" in the last 168 hours. BMP &GFR Recent Labs  Lab  11/30/22 0458 12/01/22 0351 12/03/22 0648  NA 146* 147* 142  K 3.5 3.8 3.8  CL 108 106 104  CO2 33* 30 29  GLUCOSE 129* 117* 134*  BUN 17 23 14   CREATININE 1.26* 1.40* 1.21  CALCIUM 9.6 9.8 9.6   Estimated Creatinine Clearance: 66.8 mL/min (by C-G formula based on SCr of 1.21 mg/dL). Liver & Pancreas: No results for input(s): "AST", "ALT", "ALKPHOS", "BILITOT", "PROT", "ALBUMIN" in the last 168 hours. No results for input(s): "LIPASE", "AMYLASE" in the last 168 hours. No results for input(s): "AMMONIA" in the last 168 hours. Diabetic: No results for input(s): "HGBA1C" in the last 72 hours. Recent Labs  Lab 12/05/22 0646 12/05/22 1225 12/05/22 1610  12/05/22 2105 12/06/22 0614  GLUCAP 111* 111* 168* 100* 121*   Cardiac Enzymes: No results for input(s): "CKTOTAL", "CKMB", "CKMBINDEX", "TROPONINI" in the last 168 hours. No results for input(s): "PROBNP" in the last 8760 hours. Coagulation Profile: No results for input(s): "INR", "PROTIME" in the last 168 hours. Thyroid Function Tests: No results for input(s): "TSH", "T4TOTAL", "FREET4", "T3FREE", "THYROIDAB" in the last 72 hours. Lipid Profile: No results for input(s): "CHOL", "HDL", "LDLCALC", "TRIG", "CHOLHDL", "LDLDIRECT" in the last 72 hours. Anemia Panel: No results for input(s): "VITAMINB12", "FOLATE", "FERRITIN", "TIBC", "IRON", "RETICCTPCT" in the last 72 hours. Urine analysis:    Component Value Date/Time   COLORURINE YELLOW 10/08/2022 1611   APPEARANCEUR CLEAR 10/08/2022 1611   LABSPEC 1.015 10/08/2022 1611   PHURINE 6.0 10/08/2022 1611   GLUCOSEU NEGATIVE 10/08/2022 1611   HGBUR TRACE-INTACT (A) 10/08/2022 1611   BILIRUBINUR NEGATIVE 10/08/2022 1611   KETONESUR NEGATIVE 10/08/2022 1611   PROTEINUR TRACE (A) 04/11/2020 1409   UROBILINOGEN 0.2 10/08/2022 1611   NITRITE NEGATIVE 10/08/2022 1611   LEUKOCYTESUR NEGATIVE 10/08/2022 1611   Sepsis Labs: Invalid input(s): "PROCALCITONIN", "LACTICIDVEN"   SIGNED:  Almon Hercules, MD  [facility] 12/06/2022, 8:01 AM

## 2022-12-06 NOTE — Progress Notes (Signed)
Report called to [facility] - All questions answered. Informed of ETA. SW & Bedside RN aware.

## 2022-12-06 NOTE — Progress Notes (Signed)
The test results show that your current treatment is OK, as the tests are stable.  Please continue the same plan.  There is no other need for change of treatment or further evaluation based on these results, at this time.  thanks 

## 2022-12-08 DIAGNOSIS — S32019D Unspecified fracture of first lumbar vertebra, subsequent encounter for fracture with routine healing: Secondary | ICD-10-CM | POA: Diagnosis not present

## 2022-12-08 DIAGNOSIS — I5032 Chronic diastolic (congestive) heart failure: Secondary | ICD-10-CM | POA: Diagnosis not present

## 2022-12-08 DIAGNOSIS — N189 Chronic kidney disease, unspecified: Secondary | ICD-10-CM | POA: Diagnosis not present

## 2022-12-08 DIAGNOSIS — N179 Acute kidney failure, unspecified: Secondary | ICD-10-CM | POA: Diagnosis not present

## 2022-12-08 DIAGNOSIS — J9610 Chronic respiratory failure, unspecified whether with hypoxia or hypercapnia: Secondary | ICD-10-CM | POA: Diagnosis not present

## 2022-12-08 DIAGNOSIS — I482 Chronic atrial fibrillation, unspecified: Secondary | ICD-10-CM | POA: Diagnosis not present

## 2022-12-08 DIAGNOSIS — E1165 Type 2 diabetes mellitus with hyperglycemia: Secondary | ICD-10-CM | POA: Diagnosis not present

## 2022-12-08 DIAGNOSIS — S22089A Unspecified fracture of T11-T12 vertebra, initial encounter for closed fracture: Secondary | ICD-10-CM | POA: Diagnosis not present

## 2022-12-09 DIAGNOSIS — E1165 Type 2 diabetes mellitus with hyperglycemia: Secondary | ICD-10-CM | POA: Diagnosis not present

## 2022-12-09 DIAGNOSIS — S22089A Unspecified fracture of T11-T12 vertebra, initial encounter for closed fracture: Secondary | ICD-10-CM | POA: Diagnosis not present

## 2022-12-09 DIAGNOSIS — I10 Essential (primary) hypertension: Secondary | ICD-10-CM | POA: Diagnosis not present

## 2022-12-09 DIAGNOSIS — S32019D Unspecified fracture of first lumbar vertebra, subsequent encounter for fracture with routine healing: Secondary | ICD-10-CM | POA: Diagnosis not present

## 2022-12-09 DIAGNOSIS — N189 Chronic kidney disease, unspecified: Secondary | ICD-10-CM | POA: Diagnosis not present

## 2022-12-09 DIAGNOSIS — I5032 Chronic diastolic (congestive) heart failure: Secondary | ICD-10-CM | POA: Diagnosis not present

## 2022-12-13 DIAGNOSIS — L89313 Pressure ulcer of right buttock, stage 3: Secondary | ICD-10-CM | POA: Diagnosis not present

## 2022-12-13 DIAGNOSIS — I10 Essential (primary) hypertension: Secondary | ICD-10-CM | POA: Diagnosis not present

## 2022-12-14 DIAGNOSIS — E119 Type 2 diabetes mellitus without complications: Secondary | ICD-10-CM | POA: Diagnosis not present

## 2022-12-17 ENCOUNTER — Ambulatory Visit: Payer: TRICARE For Life (TFL) | Admitting: Podiatry

## 2022-12-20 DIAGNOSIS — L89313 Pressure ulcer of right buttock, stage 3: Secondary | ICD-10-CM | POA: Diagnosis not present

## 2022-12-20 DIAGNOSIS — I509 Heart failure, unspecified: Secondary | ICD-10-CM | POA: Diagnosis not present

## 2022-12-20 DIAGNOSIS — I87333 Chronic venous hypertension (idiopathic) with ulcer and inflammation of bilateral lower extremity: Secondary | ICD-10-CM | POA: Diagnosis not present

## 2022-12-24 ENCOUNTER — Ambulatory Visit: Payer: TRICARE For Life (TFL) | Admitting: Podiatry

## 2022-12-27 DIAGNOSIS — L89313 Pressure ulcer of right buttock, stage 3: Secondary | ICD-10-CM | POA: Diagnosis not present

## 2022-12-27 DIAGNOSIS — S22088D Other fracture of T11-T12 vertebra, subsequent encounter for fracture with routine healing: Secondary | ICD-10-CM | POA: Diagnosis not present

## 2023-01-02 DIAGNOSIS — Z48811 Encounter for surgical aftercare following surgery on the nervous system: Secondary | ICD-10-CM | POA: Diagnosis not present

## 2023-01-02 DIAGNOSIS — R262 Difficulty in walking, not elsewhere classified: Secondary | ICD-10-CM | POA: Diagnosis not present

## 2023-01-02 DIAGNOSIS — S32028D Other fracture of second lumbar vertebra, subsequent encounter for fracture with routine healing: Secondary | ICD-10-CM | POA: Diagnosis not present

## 2023-01-02 DIAGNOSIS — S32019D Unspecified fracture of first lumbar vertebra, subsequent encounter for fracture with routine healing: Secondary | ICD-10-CM | POA: Diagnosis not present

## 2023-01-02 DIAGNOSIS — Z4789 Encounter for other orthopedic aftercare: Secondary | ICD-10-CM | POA: Diagnosis not present

## 2023-01-02 DIAGNOSIS — S22089D Unspecified fracture of T11-T12 vertebra, subsequent encounter for fracture with routine healing: Secondary | ICD-10-CM | POA: Diagnosis not present

## 2023-01-02 DIAGNOSIS — M25552 Pain in left hip: Secondary | ICD-10-CM | POA: Diagnosis not present

## 2023-01-03 DIAGNOSIS — E119 Type 2 diabetes mellitus without complications: Secondary | ICD-10-CM | POA: Diagnosis not present

## 2023-01-03 DIAGNOSIS — I509 Heart failure, unspecified: Secondary | ICD-10-CM | POA: Diagnosis not present

## 2023-01-03 DIAGNOSIS — L89313 Pressure ulcer of right buttock, stage 3: Secondary | ICD-10-CM | POA: Diagnosis not present

## 2023-01-04 DIAGNOSIS — I482 Chronic atrial fibrillation, unspecified: Secondary | ICD-10-CM | POA: Diagnosis not present

## 2023-01-04 DIAGNOSIS — R1312 Dysphagia, oropharyngeal phase: Secondary | ICD-10-CM | POA: Diagnosis not present

## 2023-01-04 DIAGNOSIS — M6281 Muscle weakness (generalized): Secondary | ICD-10-CM | POA: Diagnosis not present

## 2023-01-04 DIAGNOSIS — I5032 Chronic diastolic (congestive) heart failure: Secondary | ICD-10-CM | POA: Diagnosis not present

## 2023-01-04 DIAGNOSIS — N189 Chronic kidney disease, unspecified: Secondary | ICD-10-CM | POA: Diagnosis not present

## 2023-01-04 DIAGNOSIS — S32019D Unspecified fracture of first lumbar vertebra, subsequent encounter for fracture with routine healing: Secondary | ICD-10-CM | POA: Diagnosis not present

## 2023-01-04 DIAGNOSIS — N179 Acute kidney failure, unspecified: Secondary | ICD-10-CM | POA: Diagnosis not present

## 2023-01-04 DIAGNOSIS — J9611 Chronic respiratory failure with hypoxia: Secondary | ICD-10-CM | POA: Diagnosis not present

## 2023-01-05 DIAGNOSIS — M109 Gout, unspecified: Secondary | ICD-10-CM | POA: Diagnosis not present

## 2023-01-05 DIAGNOSIS — E1151 Type 2 diabetes mellitus with diabetic peripheral angiopathy without gangrene: Secondary | ICD-10-CM | POA: Diagnosis not present

## 2023-01-05 DIAGNOSIS — S22089A Unspecified fracture of T11-T12 vertebra, initial encounter for closed fracture: Secondary | ICD-10-CM | POA: Diagnosis not present

## 2023-01-05 DIAGNOSIS — I110 Hypertensive heart disease with heart failure: Secondary | ICD-10-CM | POA: Diagnosis not present

## 2023-01-05 DIAGNOSIS — I5032 Chronic diastolic (congestive) heart failure: Secondary | ICD-10-CM | POA: Diagnosis not present

## 2023-01-05 DIAGNOSIS — N1831 Chronic kidney disease, stage 3a: Secondary | ICD-10-CM | POA: Diagnosis not present

## 2023-01-05 DIAGNOSIS — Z7984 Long term (current) use of oral hypoglycemic drugs: Secondary | ICD-10-CM | POA: Diagnosis not present

## 2023-01-05 DIAGNOSIS — L89313 Pressure ulcer of right buttock, stage 3: Secondary | ICD-10-CM | POA: Diagnosis not present

## 2023-01-05 DIAGNOSIS — E1165 Type 2 diabetes mellitus with hyperglycemia: Secondary | ICD-10-CM | POA: Diagnosis not present

## 2023-01-05 DIAGNOSIS — Z556 Problems related to health literacy: Secondary | ICD-10-CM | POA: Diagnosis not present

## 2023-01-05 DIAGNOSIS — Z7901 Long term (current) use of anticoagulants: Secondary | ICD-10-CM | POA: Diagnosis not present

## 2023-01-05 DIAGNOSIS — S32029D Unspecified fracture of second lumbar vertebra, subsequent encounter for fracture with routine healing: Secondary | ICD-10-CM | POA: Diagnosis not present

## 2023-01-05 DIAGNOSIS — Z9981 Dependence on supplemental oxygen: Secondary | ICD-10-CM | POA: Diagnosis not present

## 2023-01-05 DIAGNOSIS — E1122 Type 2 diabetes mellitus with diabetic chronic kidney disease: Secondary | ICD-10-CM | POA: Diagnosis not present

## 2023-01-05 DIAGNOSIS — I872 Venous insufficiency (chronic) (peripheral): Secondary | ICD-10-CM | POA: Diagnosis not present

## 2023-01-05 DIAGNOSIS — S32018D Other fracture of first lumbar vertebra, subsequent encounter for fracture with routine healing: Secondary | ICD-10-CM | POA: Diagnosis not present

## 2023-01-05 DIAGNOSIS — J449 Chronic obstructive pulmonary disease, unspecified: Secondary | ICD-10-CM | POA: Diagnosis not present

## 2023-01-05 DIAGNOSIS — J9611 Chronic respiratory failure with hypoxia: Secondary | ICD-10-CM | POA: Diagnosis not present

## 2023-01-05 DIAGNOSIS — K5900 Constipation, unspecified: Secondary | ICD-10-CM | POA: Diagnosis not present

## 2023-01-05 DIAGNOSIS — I50810 Right heart failure, unspecified: Secondary | ICD-10-CM | POA: Diagnosis not present

## 2023-01-05 DIAGNOSIS — E785 Hyperlipidemia, unspecified: Secondary | ICD-10-CM | POA: Diagnosis not present

## 2023-01-05 DIAGNOSIS — I2721 Secondary pulmonary arterial hypertension: Secondary | ICD-10-CM | POA: Diagnosis not present

## 2023-01-05 DIAGNOSIS — I4821 Permanent atrial fibrillation: Secondary | ICD-10-CM | POA: Diagnosis not present

## 2023-01-06 ENCOUNTER — Telehealth: Payer: Self-pay | Admitting: Internal Medicine

## 2023-01-06 DIAGNOSIS — I110 Hypertensive heart disease with heart failure: Secondary | ICD-10-CM | POA: Diagnosis not present

## 2023-01-06 DIAGNOSIS — S32029D Unspecified fracture of second lumbar vertebra, subsequent encounter for fracture with routine healing: Secondary | ICD-10-CM | POA: Diagnosis not present

## 2023-01-06 DIAGNOSIS — I4821 Permanent atrial fibrillation: Secondary | ICD-10-CM | POA: Diagnosis not present

## 2023-01-06 DIAGNOSIS — N1831 Chronic kidney disease, stage 3a: Secondary | ICD-10-CM | POA: Diagnosis not present

## 2023-01-06 DIAGNOSIS — S32018D Other fracture of first lumbar vertebra, subsequent encounter for fracture with routine healing: Secondary | ICD-10-CM | POA: Diagnosis not present

## 2023-01-06 DIAGNOSIS — J9611 Chronic respiratory failure with hypoxia: Secondary | ICD-10-CM | POA: Diagnosis not present

## 2023-01-06 DIAGNOSIS — L89313 Pressure ulcer of right buttock, stage 3: Secondary | ICD-10-CM | POA: Diagnosis not present

## 2023-01-06 DIAGNOSIS — E1122 Type 2 diabetes mellitus with diabetic chronic kidney disease: Secondary | ICD-10-CM | POA: Diagnosis not present

## 2023-01-06 DIAGNOSIS — E1165 Type 2 diabetes mellitus with hyperglycemia: Secondary | ICD-10-CM | POA: Diagnosis not present

## 2023-01-06 DIAGNOSIS — K5900 Constipation, unspecified: Secondary | ICD-10-CM | POA: Diagnosis not present

## 2023-01-06 DIAGNOSIS — S32019D Unspecified fracture of first lumbar vertebra, subsequent encounter for fracture with routine healing: Secondary | ICD-10-CM | POA: Diagnosis not present

## 2023-01-06 DIAGNOSIS — J449 Chronic obstructive pulmonary disease, unspecified: Secondary | ICD-10-CM | POA: Diagnosis not present

## 2023-01-06 DIAGNOSIS — I50810 Right heart failure, unspecified: Secondary | ICD-10-CM | POA: Diagnosis not present

## 2023-01-06 DIAGNOSIS — L89312 Pressure ulcer of right buttock, stage 2: Secondary | ICD-10-CM | POA: Diagnosis not present

## 2023-01-06 DIAGNOSIS — I5032 Chronic diastolic (congestive) heart failure: Secondary | ICD-10-CM | POA: Diagnosis not present

## 2023-01-06 DIAGNOSIS — Z556 Problems related to health literacy: Secondary | ICD-10-CM | POA: Diagnosis not present

## 2023-01-06 DIAGNOSIS — M109 Gout, unspecified: Secondary | ICD-10-CM | POA: Diagnosis not present

## 2023-01-06 DIAGNOSIS — I872 Venous insufficiency (chronic) (peripheral): Secondary | ICD-10-CM | POA: Diagnosis not present

## 2023-01-06 DIAGNOSIS — Z9981 Dependence on supplemental oxygen: Secondary | ICD-10-CM | POA: Diagnosis not present

## 2023-01-06 DIAGNOSIS — S22089A Unspecified fracture of T11-T12 vertebra, initial encounter for closed fracture: Secondary | ICD-10-CM | POA: Diagnosis not present

## 2023-01-06 DIAGNOSIS — E1151 Type 2 diabetes mellitus with diabetic peripheral angiopathy without gangrene: Secondary | ICD-10-CM | POA: Diagnosis not present

## 2023-01-06 DIAGNOSIS — Z7984 Long term (current) use of oral hypoglycemic drugs: Secondary | ICD-10-CM | POA: Diagnosis not present

## 2023-01-06 DIAGNOSIS — E785 Hyperlipidemia, unspecified: Secondary | ICD-10-CM | POA: Diagnosis not present

## 2023-01-06 DIAGNOSIS — Z7901 Long term (current) use of anticoagulants: Secondary | ICD-10-CM | POA: Diagnosis not present

## 2023-01-06 DIAGNOSIS — I2721 Secondary pulmonary arterial hypertension: Secondary | ICD-10-CM | POA: Diagnosis not present

## 2023-01-06 NOTE — Telephone Encounter (Signed)
Notified Brian w/MD response../lmb 

## 2023-01-06 NOTE — Telephone Encounter (Signed)
Ok for verbals 

## 2023-01-06 NOTE — Telephone Encounter (Signed)
HH ORDERS   Caller Name: [facility] Agency Name: [facility] Callback Phone #: [phone](secure)  Service Requested: [facility] (examples: OT/PT/Skilled Nursing/Social Work/Speech Therapy/Wound Care)  Frequency of Visits: 1X a week for 8 weeks   To work on balance and stamina

## 2023-01-07 DIAGNOSIS — L89312 Pressure ulcer of right buttock, stage 2: Secondary | ICD-10-CM | POA: Diagnosis not present

## 2023-01-10 DIAGNOSIS — Z7984 Long term (current) use of oral hypoglycemic drugs: Secondary | ICD-10-CM | POA: Diagnosis not present

## 2023-01-10 DIAGNOSIS — I4821 Permanent atrial fibrillation: Secondary | ICD-10-CM | POA: Diagnosis not present

## 2023-01-10 DIAGNOSIS — S22089A Unspecified fracture of T11-T12 vertebra, initial encounter for closed fracture: Secondary | ICD-10-CM | POA: Diagnosis not present

## 2023-01-10 DIAGNOSIS — E1122 Type 2 diabetes mellitus with diabetic chronic kidney disease: Secondary | ICD-10-CM | POA: Diagnosis not present

## 2023-01-10 DIAGNOSIS — S32029D Unspecified fracture of second lumbar vertebra, subsequent encounter for fracture with routine healing: Secondary | ICD-10-CM | POA: Diagnosis not present

## 2023-01-10 DIAGNOSIS — I50810 Right heart failure, unspecified: Secondary | ICD-10-CM | POA: Diagnosis not present

## 2023-01-10 DIAGNOSIS — Z556 Problems related to health literacy: Secondary | ICD-10-CM | POA: Diagnosis not present

## 2023-01-10 DIAGNOSIS — E1151 Type 2 diabetes mellitus with diabetic peripheral angiopathy without gangrene: Secondary | ICD-10-CM | POA: Diagnosis not present

## 2023-01-10 DIAGNOSIS — S32018D Other fracture of first lumbar vertebra, subsequent encounter for fracture with routine healing: Secondary | ICD-10-CM | POA: Diagnosis not present

## 2023-01-10 DIAGNOSIS — E1165 Type 2 diabetes mellitus with hyperglycemia: Secondary | ICD-10-CM | POA: Diagnosis not present

## 2023-01-10 DIAGNOSIS — Z9981 Dependence on supplemental oxygen: Secondary | ICD-10-CM | POA: Diagnosis not present

## 2023-01-10 DIAGNOSIS — M109 Gout, unspecified: Secondary | ICD-10-CM | POA: Diagnosis not present

## 2023-01-10 DIAGNOSIS — I872 Venous insufficiency (chronic) (peripheral): Secondary | ICD-10-CM | POA: Diagnosis not present

## 2023-01-10 DIAGNOSIS — L89313 Pressure ulcer of right buttock, stage 3: Secondary | ICD-10-CM | POA: Diagnosis not present

## 2023-01-10 DIAGNOSIS — E785 Hyperlipidemia, unspecified: Secondary | ICD-10-CM | POA: Diagnosis not present

## 2023-01-10 DIAGNOSIS — I2721 Secondary pulmonary arterial hypertension: Secondary | ICD-10-CM | POA: Diagnosis not present

## 2023-01-10 DIAGNOSIS — N1831 Chronic kidney disease, stage 3a: Secondary | ICD-10-CM | POA: Diagnosis not present

## 2023-01-10 DIAGNOSIS — J449 Chronic obstructive pulmonary disease, unspecified: Secondary | ICD-10-CM | POA: Diagnosis not present

## 2023-01-10 DIAGNOSIS — I5032 Chronic diastolic (congestive) heart failure: Secondary | ICD-10-CM | POA: Diagnosis not present

## 2023-01-10 DIAGNOSIS — I110 Hypertensive heart disease with heart failure: Secondary | ICD-10-CM | POA: Diagnosis not present

## 2023-01-10 DIAGNOSIS — Z7901 Long term (current) use of anticoagulants: Secondary | ICD-10-CM | POA: Diagnosis not present

## 2023-01-10 DIAGNOSIS — K5900 Constipation, unspecified: Secondary | ICD-10-CM | POA: Diagnosis not present

## 2023-01-10 DIAGNOSIS — J9611 Chronic respiratory failure with hypoxia: Secondary | ICD-10-CM | POA: Diagnosis not present

## 2023-01-12 ENCOUNTER — Other Ambulatory Visit: Payer: Self-pay

## 2023-01-12 ENCOUNTER — Encounter (HOSPITAL_BASED_OUTPATIENT_CLINIC_OR_DEPARTMENT_OTHER): Payer: Self-pay | Admitting: Emergency Medicine

## 2023-01-12 ENCOUNTER — Emergency Department (HOSPITAL_BASED_OUTPATIENT_CLINIC_OR_DEPARTMENT_OTHER): Payer: Medicare Other

## 2023-01-12 ENCOUNTER — Inpatient Hospital Stay (HOSPITAL_BASED_OUTPATIENT_CLINIC_OR_DEPARTMENT_OTHER)
Admission: EM | Admit: 2023-01-12 | Discharge: 2023-01-19 | DRG: 872 | Disposition: A | Discharge status: Skilled Nursing Facility | Payer: Medicare Other | Attending: Internal Medicine | Admitting: Internal Medicine

## 2023-01-12 ENCOUNTER — Emergency Department (HOSPITAL_BASED_OUTPATIENT_CLINIC_OR_DEPARTMENT_OTHER): Payer: Medicare Other | Admitting: Radiology

## 2023-01-12 DIAGNOSIS — N1831 Chronic kidney disease, stage 3a: Secondary | ICD-10-CM | POA: Diagnosis not present

## 2023-01-12 DIAGNOSIS — N390 Urinary tract infection, site not specified: Secondary | ICD-10-CM | POA: Diagnosis not present

## 2023-01-12 DIAGNOSIS — R509 Fever, unspecified: Secondary | ICD-10-CM | POA: Diagnosis not present

## 2023-01-12 DIAGNOSIS — E860 Dehydration: Secondary | ICD-10-CM | POA: Diagnosis not present

## 2023-01-12 DIAGNOSIS — I4821 Permanent atrial fibrillation: Secondary | ICD-10-CM | POA: Diagnosis present

## 2023-01-12 DIAGNOSIS — Z9981 Dependence on supplemental oxygen: Secondary | ICD-10-CM | POA: Diagnosis not present

## 2023-01-12 DIAGNOSIS — M6281 Muscle weakness (generalized): Secondary | ICD-10-CM | POA: Diagnosis not present

## 2023-01-12 DIAGNOSIS — N1330 Unspecified hydronephrosis: Secondary | ICD-10-CM | POA: Diagnosis not present

## 2023-01-12 DIAGNOSIS — A4159 Other Gram-negative sepsis: Secondary | ICD-10-CM | POA: Diagnosis not present

## 2023-01-12 DIAGNOSIS — Z888 Allergy status to other drugs, medicaments and biological substances status: Secondary | ICD-10-CM

## 2023-01-12 DIAGNOSIS — Z87442 Personal history of urinary calculi: Secondary | ICD-10-CM

## 2023-01-12 DIAGNOSIS — I482 Chronic atrial fibrillation, unspecified: Secondary | ICD-10-CM | POA: Diagnosis not present

## 2023-01-12 DIAGNOSIS — E119 Type 2 diabetes mellitus without complications: Secondary | ICD-10-CM | POA: Diagnosis not present

## 2023-01-12 DIAGNOSIS — R531 Weakness: Secondary | ICD-10-CM | POA: Diagnosis present

## 2023-01-12 DIAGNOSIS — N12 Tubulo-interstitial nephritis, not specified as acute or chronic: Secondary | ICD-10-CM | POA: Diagnosis not present

## 2023-01-12 DIAGNOSIS — J4489 Other specified chronic obstructive pulmonary disease: Secondary | ICD-10-CM | POA: Diagnosis present

## 2023-01-12 DIAGNOSIS — G8929 Other chronic pain: Secondary | ICD-10-CM | POA: Diagnosis present

## 2023-01-12 DIAGNOSIS — Z1152 Encounter for screening for COVID-19: Secondary | ICD-10-CM

## 2023-01-12 DIAGNOSIS — J9612 Chronic respiratory failure with hypercapnia: Secondary | ICD-10-CM | POA: Diagnosis not present

## 2023-01-12 DIAGNOSIS — Z803 Family history of malignant neoplasm of breast: Secondary | ICD-10-CM

## 2023-01-12 DIAGNOSIS — I272 Pulmonary hypertension, unspecified: Secondary | ICD-10-CM | POA: Diagnosis not present

## 2023-01-12 DIAGNOSIS — R6883 Chills (without fever): Secondary | ICD-10-CM | POA: Diagnosis not present

## 2023-01-12 DIAGNOSIS — I2729 Other secondary pulmonary hypertension: Secondary | ICD-10-CM | POA: Diagnosis present

## 2023-01-12 DIAGNOSIS — R6520 Severe sepsis without septic shock: Secondary | ICD-10-CM | POA: Diagnosis present

## 2023-01-12 DIAGNOSIS — Z7984 Long term (current) use of oral hypoglycemic drugs: Secondary | ICD-10-CM

## 2023-01-12 DIAGNOSIS — Z87891 Personal history of nicotine dependence: Secondary | ICD-10-CM

## 2023-01-12 DIAGNOSIS — D631 Anemia in chronic kidney disease: Secondary | ICD-10-CM | POA: Diagnosis present

## 2023-01-12 DIAGNOSIS — Z7401 Bed confinement status: Secondary | ICD-10-CM | POA: Diagnosis not present

## 2023-01-12 DIAGNOSIS — R350 Frequency of micturition: Secondary | ICD-10-CM | POA: Diagnosis present

## 2023-01-12 DIAGNOSIS — E876 Hypokalemia: Secondary | ICD-10-CM | POA: Diagnosis present

## 2023-01-12 DIAGNOSIS — R338 Other retention of urine: Secondary | ICD-10-CM | POA: Diagnosis present

## 2023-01-12 DIAGNOSIS — I130 Hypertensive heart and chronic kidney disease with heart failure and stage 1 through stage 4 chronic kidney disease, or unspecified chronic kidney disease: Secondary | ICD-10-CM | POA: Diagnosis present

## 2023-01-12 DIAGNOSIS — E1122 Type 2 diabetes mellitus with diabetic chronic kidney disease: Secondary | ICD-10-CM | POA: Diagnosis present

## 2023-01-12 DIAGNOSIS — R7881 Bacteremia: Secondary | ICD-10-CM | POA: Diagnosis not present

## 2023-01-12 DIAGNOSIS — N136 Pyonephrosis: Secondary | ICD-10-CM | POA: Diagnosis not present

## 2023-01-12 DIAGNOSIS — N179 Acute kidney failure, unspecified: Secondary | ICD-10-CM | POA: Diagnosis not present

## 2023-01-12 DIAGNOSIS — R2689 Other abnormalities of gait and mobility: Secondary | ICD-10-CM | POA: Diagnosis not present

## 2023-01-12 DIAGNOSIS — I959 Hypotension, unspecified: Secondary | ICD-10-CM | POA: Diagnosis not present

## 2023-01-12 DIAGNOSIS — Z79899 Other long term (current) drug therapy: Secondary | ICD-10-CM

## 2023-01-12 DIAGNOSIS — Z820 Family history of epilepsy and other diseases of the nervous system: Secondary | ICD-10-CM

## 2023-01-12 DIAGNOSIS — J9611 Chronic respiratory failure with hypoxia: Secondary | ICD-10-CM | POA: Diagnosis not present

## 2023-01-12 DIAGNOSIS — I4891 Unspecified atrial fibrillation: Secondary | ICD-10-CM | POA: Diagnosis not present

## 2023-01-12 DIAGNOSIS — Z885 Allergy status to narcotic agent status: Secondary | ICD-10-CM

## 2023-01-12 DIAGNOSIS — H9190 Unspecified hearing loss, unspecified ear: Secondary | ICD-10-CM | POA: Diagnosis present

## 2023-01-12 DIAGNOSIS — Z8042 Family history of malignant neoplasm of prostate: Secondary | ICD-10-CM

## 2023-01-12 DIAGNOSIS — I5082 Biventricular heart failure: Secondary | ICD-10-CM | POA: Diagnosis present

## 2023-01-12 DIAGNOSIS — E662 Morbid (severe) obesity with alveolar hypoventilation: Secondary | ICD-10-CM | POA: Diagnosis present

## 2023-01-12 DIAGNOSIS — J449 Chronic obstructive pulmonary disease, unspecified: Secondary | ICD-10-CM | POA: Diagnosis not present

## 2023-01-12 DIAGNOSIS — B961 Klebsiella pneumoniae [K. pneumoniae] as the cause of diseases classified elsewhere: Secondary | ICD-10-CM | POA: Diagnosis present

## 2023-01-12 DIAGNOSIS — N139 Obstructive and reflux uropathy, unspecified: Secondary | ICD-10-CM

## 2023-01-12 DIAGNOSIS — M109 Gout, unspecified: Secondary | ICD-10-CM | POA: Diagnosis present

## 2023-01-12 DIAGNOSIS — R2681 Unsteadiness on feet: Secondary | ICD-10-CM | POA: Diagnosis not present

## 2023-01-12 DIAGNOSIS — Z7901 Long term (current) use of anticoagulants: Secondary | ICD-10-CM

## 2023-01-12 DIAGNOSIS — A419 Sepsis, unspecified organism: Secondary | ICD-10-CM | POA: Diagnosis not present

## 2023-01-12 DIAGNOSIS — E870 Hyperosmolality and hypernatremia: Secondary | ICD-10-CM | POA: Diagnosis not present

## 2023-01-12 DIAGNOSIS — N401 Enlarged prostate with lower urinary tract symptoms: Secondary | ICD-10-CM | POA: Diagnosis present

## 2023-01-12 DIAGNOSIS — M549 Dorsalgia, unspecified: Secondary | ICD-10-CM | POA: Diagnosis not present

## 2023-01-12 DIAGNOSIS — R159 Full incontinence of feces: Secondary | ICD-10-CM | POA: Diagnosis present

## 2023-01-12 DIAGNOSIS — R0602 Shortness of breath: Secondary | ICD-10-CM | POA: Diagnosis not present

## 2023-01-12 DIAGNOSIS — J9811 Atelectasis: Secondary | ICD-10-CM | POA: Diagnosis not present

## 2023-01-12 DIAGNOSIS — E785 Hyperlipidemia, unspecified: Secondary | ICD-10-CM | POA: Diagnosis present

## 2023-01-12 DIAGNOSIS — E11649 Type 2 diabetes mellitus with hypoglycemia without coma: Secondary | ICD-10-CM | POA: Diagnosis not present

## 2023-01-12 DIAGNOSIS — Z8249 Family history of ischemic heart disease and other diseases of the circulatory system: Secondary | ICD-10-CM

## 2023-01-12 DIAGNOSIS — N10 Acute tubulo-interstitial nephritis: Secondary | ICD-10-CM

## 2023-01-12 DIAGNOSIS — I5032 Chronic diastolic (congestive) heart failure: Secondary | ICD-10-CM | POA: Diagnosis present

## 2023-01-12 DIAGNOSIS — Z6839 Body mass index (BMI) 39.0-39.9, adult: Secondary | ICD-10-CM

## 2023-01-12 DIAGNOSIS — R000 Tachycardia, unspecified: Secondary | ICD-10-CM | POA: Diagnosis not present

## 2023-01-12 DIAGNOSIS — R1312 Dysphagia, oropharyngeal phase: Secondary | ICD-10-CM | POA: Diagnosis not present

## 2023-01-12 DIAGNOSIS — Z743 Need for continuous supervision: Secondary | ICD-10-CM | POA: Diagnosis not present

## 2023-01-12 DIAGNOSIS — J90 Pleural effusion, not elsewhere classified: Secondary | ICD-10-CM | POA: Diagnosis not present

## 2023-01-12 LAB — PROTIME-INR
INR: 1.5 — ABNORMAL HIGH (ref 0.8–1.2)
Prothrombin Time: 18 seconds — ABNORMAL HIGH (ref 11.4–15.2)

## 2023-01-12 LAB — URINALYSIS, W/ REFLEX TO CULTURE (INFECTION SUSPECTED)
Bacteria, UA: NONE SEEN
Bilirubin Urine: NEGATIVE
Glucose, UA: NEGATIVE mg/dL
Ketones, ur: NEGATIVE mg/dL
Nitrite: POSITIVE — AB
Protein, ur: 100 mg/dL — AB
RBC / HPF: >50 RBC/hpf (ref 0–5)
Specific Gravity, Urine: 1.014 (ref 1.005–1.030)
WBC, UA: >50 WBC/hpf (ref 0–5)
pH: 6.5 (ref 5.0–8.0)

## 2023-01-12 LAB — LACTIC ACID, PLASMA
Lactic Acid, Venous: 2.3 mmol/L (ref 0.5–1.9)
Lactic Acid, Venous: 1.3 mmol/L (ref 0.5–1.9)

## 2023-01-12 LAB — CBC WITH DIFFERENTIAL/PLATELET
Abs Immature Granulocytes: 0.12 10*3/uL — ABNORMAL HIGH (ref 0.00–0.07)
Basophils Absolute: 0.1 10*3/uL (ref 0.0–0.1)
Basophils Relative: 0 %
Eosinophils Absolute: 0 10*3/uL (ref 0.0–0.5)
Eosinophils Relative: 0 %
HCT: 40 % (ref 39.0–52.0)
Hemoglobin: 12.9 g/dL — ABNORMAL LOW (ref 13.0–17.0)
Immature Granulocytes: 1 %
Lymphocytes Relative: 8 %
Lymphs Abs: 1.5 10*3/uL (ref 0.7–4.0)
MCH: 28.9 pg (ref 26.0–34.0)
MCHC: 32.3 g/dL (ref 30.0–36.0)
MCV: 89.5 fL (ref 80.0–100.0)
Monocytes Absolute: 1.4 10*3/uL — ABNORMAL HIGH (ref 0.1–1.0)
Monocytes Relative: 7 %
Neutro Abs: 15.9 10*3/uL — ABNORMAL HIGH (ref 1.7–7.7)
Neutrophils Relative %: 84 %
Platelets: 314 10*3/uL (ref 150–400)
RBC: 4.47 MIL/uL (ref 4.22–5.81)
RDW: 16.3 % — ABNORMAL HIGH (ref 11.5–15.5)
WBC: 18.9 10*3/uL — ABNORMAL HIGH (ref 4.0–10.5)
nRBC: 0 % (ref 0.0–0.2)

## 2023-01-12 LAB — RESP PANEL BY RT-PCR (RSV, FLU A&B, COVID)  RVPGX2
Influenza A by PCR: NEGATIVE
Influenza B by PCR: NEGATIVE
Resp Syncytial Virus by PCR: NEGATIVE
SARS Coronavirus 2 by RT PCR: NEGATIVE

## 2023-01-12 LAB — I-STAT VENOUS BLOOD GAS, ED
Acid-Base Excess: 7 mmol/L — ABNORMAL HIGH (ref 0.0–2.0)
Bicarbonate: 31.6 mmol/L — ABNORMAL HIGH (ref 20.0–28.0)
Calcium, Ion: 1.28 mmol/L (ref 1.15–1.40)
HCT: 33 % — ABNORMAL LOW (ref 39.0–52.0)
Hemoglobin: 11.2 g/dL — ABNORMAL LOW (ref 13.0–17.0)
O2 Saturation: 89 %
Potassium: 3 mmol/L — ABNORMAL LOW (ref 3.5–5.1)
Sodium: 140 mmol/L (ref 135–145)
TCO2: 33 mmol/L — ABNORMAL HIGH (ref 22–32)
pCO2, Ven: 46 mmHg (ref 44–60)
pH, Ven: 7.446 — ABNORMAL HIGH (ref 7.25–7.43)
pO2, Ven: 55 mmHg — ABNORMAL HIGH (ref 32–45)

## 2023-01-12 LAB — COMPREHENSIVE METABOLIC PANEL
ALT: 26 U/L (ref 0–44)
AST: 18 U/L (ref 15–41)
Albumin: 2.9 g/dL — ABNORMAL LOW (ref 3.5–5.0)
Alkaline Phosphatase: 56 U/L (ref 38–126)
Anion gap: 12 (ref 5–15)
BUN: 37 mg/dL — ABNORMAL HIGH (ref 8–23)
CO2: 28 mmol/L (ref 22–32)
Calcium: 9.7 mg/dL (ref 8.9–10.3)
Chloride: 100 mmol/L (ref 98–111)
Creatinine, Ser: 1.98 mg/dL — ABNORMAL HIGH (ref 0.61–1.24)
GFR, Estimated: 34 mL/min — ABNORMAL LOW (ref >60)
Glucose, Bld: 105 mg/dL — ABNORMAL HIGH (ref 70–99)
Potassium: 3.5 mmol/L (ref 3.5–5.1)
Sodium: 140 mmol/L (ref 135–145)
Total Bilirubin: 0.8 mg/dL (ref 0.3–1.2)
Total Protein: 7.3 g/dL (ref 6.5–8.1)

## 2023-01-12 MED ORDER — LACTATED RINGERS IV BOLUS
30.0000 mL/kg | Freq: Once | INTRAVENOUS | Status: AC
  Administered 2023-01-12: 2052 mL via INTRAVENOUS

## 2023-01-12 MED ORDER — IOHEXOL 300 MG/ML  SOLN
100.0000 mL | Freq: Once | INTRAMUSCULAR | Status: AC | PRN
  Administered 2023-01-12: 70 mL via INTRAVENOUS

## 2023-01-12 MED ORDER — SODIUM CHLORIDE 0.9 % IV SOLN
2.0000 g | Freq: Once | INTRAVENOUS | Status: AC
  Administered 2023-01-12: 2 g via INTRAVENOUS
  Filled 2023-01-12: qty 12.5

## 2023-01-12 MED ORDER — VANCOMYCIN HCL IN DEXTROSE 1-5 GM/200ML-% IV SOLN
1000.0000 mg | Freq: Once | INTRAVENOUS | Status: AC
  Administered 2023-01-12: 1000 mg via INTRAVENOUS
  Filled 2023-01-12: qty 200

## 2023-01-12 MED ORDER — ACETAMINOPHEN 500 MG PO TABS
1000.0000 mg | ORAL_TABLET | Freq: Once | ORAL | Status: AC
  Administered 2023-01-12: 1000 mg via ORAL
  Filled 2023-01-12: qty 2

## 2023-01-12 MED ORDER — METRONIDAZOLE 500 MG/100ML IV SOLN
500.0000 mg | Freq: Once | INTRAVENOUS | Status: AC
  Administered 2023-01-12: 500 mg via INTRAVENOUS
  Filled 2023-01-12: qty 100

## 2023-01-12 MED ORDER — VANCOMYCIN HCL IN DEXTROSE 1-5 GM/200ML-% IV SOLN
1000.0000 mg | Freq: Once | INTRAVENOUS | Status: DC
  Filled 2023-01-12: qty 200

## 2023-01-12 MED ORDER — VANCOMYCIN HCL 750 MG/150ML IV SOLN
750.0000 mg | INTRAVENOUS | Status: DC
  Filled 2023-01-12: qty 150

## 2023-01-12 MED ORDER — LACTATED RINGERS IV SOLN: INTRAVENOUS | Status: DC

## 2023-01-12 MED ORDER — SODIUM CHLORIDE 0.9 % IV SOLN
2.0000 g | Freq: Two times a day (BID) | INTRAVENOUS | Status: DC
  Filled 2023-01-12: qty 12.5

## 2023-01-12 MED ORDER — VANCOMYCIN HCL IN DEXTROSE 1-5 GM/200ML-% IV SOLN
1000.0000 mg | Freq: Once | INTRAVENOUS | Status: AC
  Administered 2023-01-12: 1000 mg via INTRAVENOUS

## 2023-01-12 NOTE — ED Notes (Signed)
VBG ordered and completed.

## 2023-01-12 NOTE — Progress Notes (Signed)
Pharmacy Antibiotic Note  [name] [name] is a 77 y.o. male admitted on 01/12/2023 with sepsis.  Pharmacy has been consulted for cefepime and vancomycin dosing. Mild AKI, Scr: 1.98 baseline 1.16.   Plan: Cefepime 2g q12H.  Vancomycin 2g loading dose, 750mg  Q24H eAUC: 439, Vd: 0.5, Scr: 1.98.  Follow culture data for de-escalation.  Monitor renal function for dose adjustments as indicated.   Height: 5\' 8"  (172.7 cm) Weight: 115.8 kg (255 lb 4.7 oz) IBW/kg (Calculated) : 68.4  Temp (24hrs), Avg:100.7 F (38.2 C), Min:100.7 F (38.2 C), [place]:100.7 F (38.2 C)  Recent Labs  Lab 01/12/23 2034  WBC 18.9*  CREATININE 1.98*  LATICACIDVEN 2.3*    Estimated Creatinine Clearance: 38.6 mL/min (A) (by C-G formula based on SCr of 1.98 mg/dL (H)).    Allergies  Allergen Reactions   Codeine     Altered mental status "goofy"   Heparin Rash     Abdominal rash 01/2013     Thank you for allowing pharmacy to be a part of this patient's care.  Estill Batten, PharmD, BCCCP  01/12/2023 9:42 PM

## 2023-01-12 NOTE — ED Triage Notes (Signed)
Ems-110/70 Fever and chills since 0300 Given tylenol earlier today per EMS On 4 l [place] normal  Moaning, on arrival

## 2023-01-12 NOTE — ED Notes (Signed)
Date and time results received: 01/12/23 2114 (use smartphrase ".now" to insert current time)  Test: Lactic Acid Critical Value: 2.3  Name of Provider Notified: Doreene Eland, MD  Orders Received? Or Actions Taken?:  n/a

## 2023-01-12 NOTE — ED Notes (Signed)
Primofit applied and pt able to give UA, approx of urine in suction canister, pt cont to have large volume on bladder scan, md ordered catheter due to obstruction and retention. Catheter inserted without difficulty.

## 2023-01-12 NOTE — ED Notes (Signed)
Pt desatting to upper 80s on 4L [place] that he wears at baseline, increased to 5L [place], RT and MD notified and pt wears BiPAP at night.

## 2023-01-12 NOTE — Sepsis Progress Note (Signed)
Following for sepsis monitoring ?

## 2023-01-12 NOTE — ED Notes (Signed)
Patient on 4LNC at baseline.

## 2023-01-12 NOTE — ED Provider Notes (Signed)
[place] EMERGENCY DEPARTMENT AT [facility] Provider Note  CSN: [id number] Arrival date & time: 01/12/23 2009  Chief Complaint(s) Generalized Body Aches  HPI [name] [name] is a 77 y.o. male history of diabetes, hypertension, hyperlipidemia, obesity hypoventilation syndrome on home oxygen, prior history of pulmonary hypertension although a normal right ventricular function on recent echocardiogram presented to the emergency department with generalized weakness.  Patient's daughter reports that earlier today the patient began complaining of feeling cold, having shaking.  Had fever.  He was putting on extra sweater as even though it was hot outside.  She called the nurse line who advised that he should come in.  He reports that he has had some chronic back pain since his recent surgery which is unchanged.  Has also had some mild cough.  Has not noticed any specific urinary changes but daughter reports that he has been having some episodes of incontinence.  No focal weakness, numbness or tingling.  No bowel incontinence.   Past Medical History Past Medical History:  Diagnosis Date   Acute right-sided CHF (congestive heart failure) (HCC)    a. 01/2013.   Arthritis    "knees and hands; thoracic area of the spine" (01/05/2013)   BRONCHITIS, CHRONIC 01/02/2009   COMMON MIGRAINE    "none since treating for high BP"   Complication of anesthesia    "aspiration pneumonia after hand OR" (01/05/2013)   DIABETES MELLITUS, TYPE II    GOUT    HYPERLIPIDEMIA    HYPERTENSION    Long term (current) use of anticoagulants    Morbid obesity (HCC)    OBESITY HYPOVENTILATION SYNDROME    a. on home O2.   On home oxygen therapy    OSA (obstructive sleep apnea)    Permanent atrial fibrillation (HCC)    PROSTATE SPECIFIC ANTIGEN, ELEVATED 06/09/2007   Pulmonary HTN (HCC)    a. multifactorial including obstructive sleep apnea, obesity hypoventilation syndrome and possible pulmonary venous hypertension.    SKIN RASH 03/12/2010   Unspecified hearing loss    Patient Active Problem List   Diagnosis Date Noted   Closed fracture of eleventh thoracic vertebra (HCC) 11/23/2022   Closed L2 vertebral fracture (HCC) 11/18/2022   Chronic a-fib (HCC) 11/18/2022   L1 vertebral fracture (HCC) 11/18/2022   Subacute cough 10/10/2022   Fatigue 10/08/2022   Cellulitis of right leg 10/08/2022   Chronic respiratory failure with hypoxia and hypercapnia related to obesity hypoventilation syndrome 03/19/2022   Chronic diastolic CHF (congestive heart failure) (HCC) 03/19/2022   Actinic keratosis 11/27/2021   Sebaceous cyst 11/27/2021   Urinary frequency 10/17/2021   Left arm swelling 01/18/2021   Vitamin D deficiency 10/10/2020   B12 deficiency 10/10/2020   Lower extremity weakness 09/12/2020   Lesion of right ear 04/13/2020   Arthritis of right acromioclavicular joint 04/02/2020   Right rotator cuff tear 04/02/2020   Greater trochanteric bursitis, right 08/14/2019   Tick bite, infected 12/19/2018   Degenerative arthritis of right knee 02/21/2017   Right knee pain 02/15/2017   Volume overload 12/23/2015   OSA treated with BiPAP 12/23/2015   Pulmonary hypertension (HCC) 12/23/2015   Gout 12/23/2015   Cellulitis of leg, right 02/14/2015   Allergic rhinitis, cause unspecified 11/21/2013   Chronic cor pulmonale (HCC) 12/22/2012   DOE (dyspnea on exertion) 12/20/2012   Left knee pain 07/11/2012   Increased prostate specific antigen (PSA) velocity 01/06/2012   Encounter for well adult exam with abnormal findings 12/25/2010   Long term (current)  use of anticoagulants 11/06/2010   HEMATOCHEZIA 06/26/2010   BRONCHITIS, CHRONIC 01/02/2009   Obesity hypoventilation syndrome (HCC) 11/26/2008   Permanent atrial fibrillation 10/16/2008   PERIPHERAL EDEMA 01/05/2008   Non-insulin dependent type 2 diabetes mellitus (HCC) 06/09/2007   COMMON MIGRAINE 06/09/2007   Unspecified hearing loss 06/09/2007   PROSTATE  SPECIFIC ANTIGEN, ELEVATED 06/09/2007   Hyperlipidemia 02/18/2007   Severe obesity (BMI >= 40) (HCC) 02/18/2007   Essential hypertension 02/18/2007   Home Medication(s) Prior to Admission medications   Medication Sig Start Date End Date Taking? Authorizing Provider  allopurinol (ZYLOPRIM) 100 MG tablet TAKE 1 TABLET DAILY 01/25/22   Corwin Levins, MD  atorvastatin (LIPITOR) 20 MG tablet TAKE 1 TABLET DAILY 01/25/22   Corwin Levins, MD  cholecalciferol (VITAMIN D3) 25 MCG (1000 UNIT) tablet Take 2,000 Units by mouth daily.    [provider]  diltiazem (CARDIZEM CD) 180 MG 24 hr capsule 1 tab by mouth once daily 10/26/22   Corwin Levins, MD  ELIQUIS 5 MG TABS tablet TAKE 1 TABLET TWICE A DAY 01/25/22   Lewayne Bunting, MD  glipiZIDE (GLUCOTROL XL) 10 MG 24 hr tablet Take 1 tablet (10 mg total) by mouth daily with breakfast. 07/15/22   Corwin Levins, MD  Lancets MISC Use as directed once daily  E11.9 04/19/17   Corwin Levins, MD  metFORMIN (GLUCOPHAGE-XR) 500 MG 24 hr tablet TAKE 2 TABLETS IN THE MORNING 01/25/22   Corwin Levins, MD  metoprolol tartrate (LOPRESSOR) 25 MG tablet TAKE 1 TABLET(25 MG) BY MOUTH TWICE DAILY 07/12/22   Corwin Levins, MD  Misc Natural Products (TART CHERRY ADVANCED) CAPS Take 1 capsule by mouth daily.    [provider]  OXYGEN Inhale 3-5 L into the lungs daily. 3L Daily  4L Resting  5L Exertion    [provider]  senna-docusate (SENOKOT-S) 8.6-50 MG tablet Take 1 tablet by mouth 2 (two) times daily. 12/05/22   Kathlen Mody, MD  torsemide (DEMADEX) 20 MG tablet Take 2 tablets (40 mg total) by mouth 2 (two) times daily. 12/06/22   Almon Hercules, MD                                                                                                                                    Past Surgical History Past Surgical History:  Procedure Laterality Date   APPENDECTOMY  1974   APPLICATION OF ROBOTIC ASSISTANCE FOR SPINAL PROCEDURE N/A 11/23/2022    Procedure: APPLICATION OF ROBOTIC ASSISTANCE FOR SPINAL PROCEDURE;  Surgeon: Lisbeth Renshaw, MD;  Location: [facility] OR;  Service: Neurosurgery;  Laterality: N/A;   CARDIOVERSION  05/21/2008; ~ 06/2008   HERNIA REPAIR     INGUINAL HERNIA REPAIR Right    RIGHT HEART CATHETERIZATION N/A 01/08/2013   Procedure: RIGHT HEART CATH;  Surgeon: Vesta Mixer, MD;  Location[facility] C[facility] LAB;  Service: Cardiovascular;  Laterality: N/A;   TENDON REPAIR Right 2009   "lacerated his tendon and pulley" Dr. Izora Ribas   UMBILICAL HERNIA REPAIR     Family History Family History  Problem Relation Age of Onset   Alzheimer's disease Father    Prostate cancer Father    Cancer Other        Breast Cancer, <50 yo 1st degree relative   Coronary artery disease Other        Male, 1st degree relative    Social History Social History   Tobacco Use   Smoking status: Former    Packs/day: 1.00    Years: 15.00    Additional pack years: 0.00    Total pack years: 15.00    Types: Cigarettes    Quit date: 08/09/1978    Years since quitting: 44.4   Smokeless tobacco: Never   Tobacco comments:    01/05/2013 "quit smoking ~ 40 years ago"  Vaping Use   Vaping Use: Never used  Substance Use Topics   Alcohol use: Yes    Alcohol/week: 0.0 standard drinks of alcohol    Comment: 01/05/2013 "hasn't had a drink since 1980's; never had problem w/it"   Drug use: No   Allergies Codeine and Heparin  Review of Systems Review of Systems  All other systems reviewed and are negative.   Physical Exam Vital Signs  I have reviewed the triage vital signs BP (!) 125/93   Pulse 99   Temp (!) 101.6 F (38.7 C) (Oral)   Resp 20   Ht 5\' 8"  (1.727 m)   Wt 115.8 kg   SpO2 96%   BMI 38.82 kg/m  Physical Exam Vitals and nursing note reviewed.  Constitutional:      General: He is in acute distress.     Appearance: Normal appearance. He is ill-appearing.  HENT:     Mouth/Throat:     Mouth: Mucous membranes are dry.  Eyes:      Conjunctiva/sclera: Conjunctivae normal.  Cardiovascular:     Rate and Rhythm: Regular rhythm. Tachycardia present.  Pulmonary:     Effort: Pulmonary effort is normal. No respiratory distress.     Breath sounds: Normal breath sounds.  Abdominal:     General: Abdomen is flat.     Palpations: Abdomen is soft.     Tenderness: There is abdominal tenderness (diffusely). There is right CVA tenderness and left CVA tenderness.  Musculoskeletal:     Right lower leg: Edema present.     Left lower leg: Edema present.     Comments: Chronic appearing BLE changes. No focal spinal ttp   Skin:    General: Skin is warm and dry.     Capillary Refill: Capillary refill takes less than 2 seconds.  Neurological:     Mental Status: He is alert and oriented to person, place, and time. Mental status is at baseline.     Comments: Strength 5/5 in BLE, no sensory deficit  Psychiatric:        Mood and Affect: Mood normal.        Behavior: Behavior normal.     ED Results and Treatments Labs (all labs ordered are listed, but only abnormal results are displayed) Labs Reviewed  COMPREHENSIVE METABOLIC PANEL - Abnormal; Notable for the following components:      Result Value   Glucose, Bld 105 (*)    BUN 37 (*)    Creatinine, Ser 1.98 (*)    Albumin 2.9 (*)    GFR, Estimated  34 (*)    All other components within normal limits  LACTIC ACID, PLASMA - Abnormal; Notable for the following components:   Lactic Acid, Venous 2.3 (*)    All other components within normal limits  CBC WITH DIFFERENTIAL/PLATELET - Abnormal; Notable for the following components:   WBC 18.9 (*)    Hemoglobin 12.9 (*)    RDW 16.3 (*)    Neutro Abs 15.9 (*)    Monocytes Absolute 1.4 (*)    Abs Immature Granulocytes 0.12 (*)    All other components within normal limits  PROTIME-INR - Abnormal; Notable for the following components:   Prothrombin Time 18.0 (*)    INR 1.5 (*)    All other components within normal limits  URINALYSIS,  W/ REFLEX TO CULTURE (INFECTION SUSPECTED) - Abnormal; Notable for the following components:   Color, Urine ORANGE (*)    APPearance CLOUDY (*)    Hgb urine dipstick LARGE (*)    Protein, ur 100 (*)    Nitrite POSITIVE (*)    Leukocytes,Ua LARGE (*)    All other components within normal limits  I-STAT VENOUS BLOOD GAS, ED - Abnormal; Notable for the following components:   pH, Ven 7.446 (*)    pO2, Ven 55 (*)    Bicarbonate 31.6 (*)    TCO2 33 (*)    Acid-Base Excess 7.0 (*)    Potassium 3.0 (*)    HCT 33.0 (*)    Hemoglobin 11.2 (*)    All other components within normal limits  RESP PANEL BY RT-PCR (RSV, FLU A&B, COVID)  RVPGX2  CULTURE, BLOOD (ROUTINE X 2)  CULTURE, BLOOD (ROUTINE X 2)  URINE CULTURE  LACTIC ACID, PLASMA                                                                                                                          Radiology CT CHEST ABDOMEN PELVIS W CONTRAST  Result Date: 01/12/2023 CLINICAL DATA:  Sepsis, fever and chills. EXAM: CT CHEST, ABDOMEN, AND PELVIS WITH CONTRAST TECHNIQUE: Multidetector CT imaging of the chest, abdomen and pelvis was performed following the standard protocol during bolus administration of intravenous contrast. RADIATION DOSE REDUCTION: This exam was performed according to the departmental dose-optimization program which includes automated exposure control, adjustment of the mA and/or kV according to patient size and/or use of iterative reconstruction technique. CONTRAST:  70mL OMNIPAQUE IOHEXOL 300 MG/ML  SOLN COMPARISON:  None Available. FINDINGS: CT CHEST FINDINGS Cardiovascular: The heart is enlarged and there is no pericardial effusion. A few scattered coronary artery calcifications are noted. There is atherosclerotic calcification of the aorta without evidence of aneurysm. The pulmonary trunk is normal in caliber. Mediastinum/Nodes: No mediastinal or axillary lymphadenopathy. A prominent lymph node is present at the right hilum  measuring 1.2 cm. The thyroid gland, trachea, and esophagus are within normal limits. Lungs/Pleura: Bronchial wall thickening is noted in the lower lobes bilaterally with patchy atelectasis or infiltrate. No effusion or pneumothorax.  Musculoskeletal: Degenerative changes are present in the thoracic spine. No acute fracture. Spinal fusion hardware is noted in the thoracolumbar spine. CT ABDOMEN PELVIS FINDINGS Hepatobiliary: Subcentimeter hypodensities are present in the liver, most likely representing cysts or hemangiomas. No biliary ductal dilatation. The gallbladder is without stones. Pancreas: Unremarkable. No pancreatic ductal dilatation or surrounding inflammatory changes. Spleen: Normal in size without focal abnormality. Adrenals/Urinary Tract: Fat attenuation nodules are present in the left adrenal gland measuring up to 1.3 cm, compatible with myelolipomas. There is a 1.8 cm nodule in the left adrenal gland measuring 1.83 cm with attenuation of 8 Hounsfield units, likely adenoma. The right adrenal gland is within normal limits. The kidneys enhance symmetrically. Multiple cysts are noted in the left kidney. No renal calculus bilaterally. No obstructive uropathy on the right. There is mild hydroureteronephrosis on the left with perinephric and periureteral fat stranding. No significant bladder wall thickening. There is diffuse perivesicular fat stranding. Stomach/Bowel: Stomach is within normal limits. Appendix is not seen. No evidence of bowel wall thickening, distention, or inflammatory changes. No free air or pneumatosis. A few scattered diverticula are present along the colon without evidence of diverticulitis. Vascular/Lymphatic: Aortic atherosclerosis. Prominent lymph nodes are present in the retroperitoneum in the periaortic space and in the perivesicular region which may be reactive. Reproductive: The prostate gland is mildly enlarged. Other: No abdominopelvic ascites. Musculoskeletal: Degenerative  changes are present in the lumbar spine and spinal fusion hardware is noted. A cystic structure is present in the subcutaneous tissues over the gluteal region on the left posteriorly, possible large sebaceous cyst. IMPRESSION: 1. Mild hydroureteronephrosis on the left with no obstructing stone. There is perinephric and periureteral fat stranding on the left with perivesicular fat stranding about the bladder and local lymphadenopathy, which may be infectious or inflammatory. 2. Bronchial wall thickening in the lower lobes bilaterally with mild atelectasis or infiltrate. 3. Left adrenal nodules, compatible with adenoma and myelolipomas. 4. Coronary artery calcifications. 5. Aortic atherosclerosis. Electronically Signed   By: Thornell Sartorius M.D.   On: 01/12/2023 22:29   CT Head Wo Contrast  Result Date: 01/12/2023 CLINICAL DATA:  Fever and chills.  Mental status change EXAM: CT HEAD WITHOUT CONTRAST TECHNIQUE: Contiguous axial images were obtained from the base of the skull through the vertex without intravenous contrast. RADIATION DOSE REDUCTION: This exam was performed according to the departmental dose-optimization program which includes automated exposure control, adjustment of the mA and/or kV according to patient size and/or use of iterative reconstruction technique. COMPARISON:  CT head 11/18/2022 FINDINGS: Brain: No intracranial hemorrhage, mass effect, or evidence of acute infarct. No hydrocephalus. No extra-axial fluid collection. Generalized cerebral atrophy. Ill-defined hypoattenuation within the cerebral white matter is nonspecific but consistent with chronic small vessel ischemic disease. Vascular: No hyperdense vessel. Intracranial arterial calcification. Skull: No fracture or focal lesion. Sinuses/Orbits: No acute finding. Paranasal sinuses and mastoid air cells are well aerated. Other: None. IMPRESSION: 1. No evidence of acute intracranial abnormality. Electronically Signed   By: Minerva Fester M.D.    On: 01/12/2023 22:20   DG Chest Port 1 View  Result Date: 01/12/2023 CLINICAL DATA:  Shortness of breath EXAM: PORTABLE CHEST 1 VIEW COMPARISON:  Chest radiograph dated 11/22/2022 FINDINGS: Low lung volumes. Bibasilar patchy opacities. Small bilateral pleural effusions. No pneumothorax. Similar enlarged cardiomediastinal silhouette. Partially imaged lumbar spinal fixation hardware appears intact. IMPRESSION: 1. Low lung volumes with bibasilar patchy opacities, may represent atelectasis, aspiration, or pneumonia. 2. Small bilateral pleural effusions. 3. Similar cardiomegaly. Electronically  Signed   By: Agustin Cree M.D.   On: 01/12/2023 21:06    Pertinent labs & imaging results that were available during my care of the patient were reviewed by me and considered in my medical decision making (see MDM for details).  Medications Ordered in ED Medications  lactated ringers infusion ( Intravenous New Bag/Given 01/12/23 2309)  vancomycin (VANCOREADY) IVPB 750 mg/150 mL (has no administration in time range)  ceFEPIme (MAXIPIME) 2 g in sodium chloride 0.9 % 100 mL IVPB (has no administration in time range)  acetaminophen (TYLENOL) tablet 1,000 mg (has no administration in time range)  ceFEPIme (MAXIPIME) 2 g in sodium chloride 0.9 % 100 mL IVPB (0 g Intravenous Stopped 01/12/23 2129)  metroNIDAZOLE (FLAGYL) IVPB 500 mg (0 mg Intravenous Stopped 01/12/23 2233)  lactated ringers bolus 2,052 mL (0 mLs Intravenous Stopped 01/12/23 2311)  iohexol (OMNIPAQUE) 300 MG/ML solution 100 mL (70 mLs Intravenous Contrast Given 01/12/23 2138)  vancomycin (VANCOCIN) IVPB 1000 mg/200 mL premix (1,000 mg Intravenous New Bag/Given 01/12/23 2233)    And  vancomycin (VANCOCIN) IVPB 1000 mg/200 mL premix (1,000 mg Intravenous New Bag/Given 01/12/23 2233)                                                                                                                                     Procedures .Critical Care  Performed by: Lonell Grandchild, MD Authorized by: Lonell Grandchild, MD   Critical care provider statement:    Critical care time (minutes):  45   Critical care was necessary to treat or prevent imminent or life-threatening deterioration of the following conditions:  Sepsis   Critical care was time spent personally by me on the following activities:  Development of treatment plan with patient or surrogate, discussions with consultants, evaluation of patient's response to treatment, examination of patient, ordering and review of laboratory studies, ordering and review of radiographic studies, ordering and performing treatments and interventions, pulse oximetry, re-evaluation of patient's condition and review of old charts   (including critical care time)  Medical Decision Making / ED Course   MDM:  77 year old male presenting to the emergency department with fever and generalized weakness.  Patient initially in acute distress, ill-appearing.  Also with fever and tachycardia.  After fluid resuscitation and antibiotics patient reports he feels much better and looks much better.  Vital signs have improved as well with resolution of tachycardia and improvement in blood pressure.  Obtained imaging to evaluate for source, appears to have urinary retention with urinary infection.  Had significant postvoid residual and Foley catheter was placed.  Covered with antibiotics and urine culture as well as blood cultures were obtained.  CT chest also with potential pneumonia although less likely, lung seemed clear on exam.  Low concern for meningitis or encephalitis, patient initially mildly confused although improved with treatment and he denies headache, no meningismus.  Doubt occult spinal infection, he  does have chronic back pain that is unchanged.  Doubt urinary retention is due to cord compression or other spinal process, no other neurologic symptoms.  Given age, sepsis patient will need to be admitted for further monitoring.   Will discuss with hospitalist. Clinical Course as of 01/12/23 2352  Wed Jan 12, 2023  2350 Signed out to Dr. Pilar Plate pending discussion with the hospitalist. [WS]    Clinical Course User Index [WS] Lonell Grandchild, MD     Additional history obtained: -Additional history obtained from family and ems -External records from outside source obtained and reviewed including: Chart review including previous notes, labs, imaging, consultation notes including prior echocardiogram   Lab Tests: -I ordered, reviewed, and interpreted labs.   The pertinent results include:   Labs Reviewed  COMPREHENSIVE METABOLIC PANEL - Abnormal; Notable for the following components:      Result Value   Glucose, Bld 105 (*)    BUN 37 (*)    Creatinine, Ser 1.98 (*)    Albumin 2.9 (*)    GFR, Estimated 34 (*)    All other components within normal limits  LACTIC ACID, PLASMA - Abnormal; Notable for the following components:   Lactic Acid, Venous 2.3 (*)    All other components within normal limits  CBC WITH DIFFERENTIAL/PLATELET - Abnormal; Notable for the following components:   WBC 18.9 (*)    Hemoglobin 12.9 (*)    RDW 16.3 (*)    Neutro Abs 15.9 (*)    Monocytes Absolute 1.4 (*)    Abs Immature Granulocytes 0.12 (*)    All other components within normal limits  PROTIME-INR - Abnormal; Notable for the following components:   Prothrombin Time 18.0 (*)    INR 1.5 (*)    All other components within normal limits  URINALYSIS, W/ REFLEX TO CULTURE (INFECTION SUSPECTED) - Abnormal; Notable for the following components:   Color, Urine ORANGE (*)    APPearance CLOUDY (*)    Hgb urine dipstick LARGE (*)    Protein, ur 100 (*)    Nitrite POSITIVE (*)    Leukocytes,Ua LARGE (*)    All other components within normal limits  I-STAT VENOUS BLOOD GAS, ED - Abnormal; Notable for the following components:   pH, Ven 7.446 (*)    pO2, Ven 55 (*)    Bicarbonate 31.6 (*)    TCO2 33 (*)    Acid-Base Excess 7.0  (*)    Potassium 3.0 (*)    HCT 33.0 (*)    Hemoglobin 11.2 (*)    All other components within normal limits  RESP PANEL BY RT-PCR (RSV, FLU A&B, COVID)  RVPGX2  CULTURE, BLOOD (ROUTINE X 2)  CULTURE, BLOOD (ROUTINE X 2)  URINE CULTURE  LACTIC ACID, PLASMA    Notable for UTI, leukocytosis, AKI  EKG   EKG Interpretation  Date/Time:  Wednesday January 12 2023 20:37:42 EDT Ventricular Rate:  147 PR Interval:    QRS Duration: 88 QT Interval:  304 QTC Calculation: 476 R Axis:   118 Text Interpretation: Atrial fibrillation with rapid V-rate Left posterior fascicular block Anterior infarct, old Repolarization abnormality, prob rate related Baseline wander in lead(s) V2 Confirmed by Alvino Blood ([id number]) on 01/12/2023 10:11:12 PM         Imaging Studies ordered: I ordered imaging studies including CT C/A/P On my interpretation imaging demonstrates urinary obstruction  I independently visualized and interpreted imaging. I agree with the radiologist interpretation   Medicines ordered and prescription  drug management: Meds ordered this encounter  Medications   lactated ringers infusion   ceFEPIme (MAXIPIME) 2 g in sodium chloride 0.9 % 100 mL IVPB    Order Specific Question:   Antibiotic Indication:    Answer:   Other Indication (list below)    Order Specific Question:   Other Indication:    Answer:   Unknown source   metroNIDAZOLE (FLAGYL) IVPB 500 mg    Order Specific Question:   Antibiotic Indication:    Answer:   Other Indication (list below)    Order Specific Question:   Other Indication:    Answer:   Unknown source   DISCONTD: vancomycin (VANCOCIN) IVPB 1000 mg/200 mL premix    Order Specific Question:   Indication:    Answer:   Other Indication (list below)    Order Specific Question:   Other Indication:    Answer:   Unknown source   lactated ringers bolus 2,052 mL   iohexol (OMNIPAQUE) 300 MG/ML solution 100 mL   AND Linked Order Group    vancomycin (VANCOCIN)  IVPB 1000 mg/200 mL premix     Order Specific Question:   Indication:     Answer:   Sepsis    vancomycin (VANCOCIN) IVPB 1000 mg/200 mL premix     Order Specific Question:   Indication:     Answer:   Sepsis   vancomycin (VANCOREADY) IVPB 750 mg/150 mL    Order Specific Question:   Indication:    Answer:   Sepsis   ceFEPIme (MAXIPIME) 2 g in sodium chloride 0.9 % 100 mL IVPB    Order Specific Question:   Antibiotic Indication:    Answer:   Sepsis   acetaminophen (TYLENOL) tablet 1,000 mg    -I have reviewed the patients home medicines and have made adjustments as needed    Cardiac Monitoring: The patient was maintained on a cardiac monitor.  I personally viewed and interpreted the cardiac monitored which showed an underlying rhythm of: atrial fibrillation  Social Determinants of Health:  Diagnosis or treatment significantly limited by social determinants of health: former smoker   Reevaluation: After the interventions noted above, I reevaluated the patient and found that their symptoms have improved  Co morbidities that complicate the patient evaluation  Past Medical History:  Diagnosis Date   Acute right-sided CHF (congestive heart failure) (HCC)    a. 01/2013.   Arthritis    "knees and hands; thoracic area of the spine" (01/05/2013)   BRONCHITIS, CHRONIC 01/02/2009   COMMON MIGRAINE    "none since treating for high BP"   Complication of anesthesia    "aspiration pneumonia after hand OR" (01/05/2013)   DIABETES MELLITUS, TYPE II    GOUT    HYPERLIPIDEMIA    HYPERTENSION    Long term (current) use of anticoagulants    Morbid obesity (HCC)    OBESITY HYPOVENTILATION SYNDROME    a. on home O2.   On home oxygen therapy    OSA (obstructive sleep apnea)    Permanent atrial fibrillation (HCC)    PROSTATE SPECIFIC ANTIGEN, ELEVATED 06/09/2007   Pulmonary HTN (HCC)    a. multifactorial including obstructive sleep apnea, obesity hypoventilation syndrome and possible pulmonary  venous hypertension.   SKIN RASH 03/12/2010   Unspecified hearing loss       Dispostion: Disposition decision including need for hospitalization was considered, and patient disposition pending at time of sign out.    Final Clinical Impression(s) / ED Diagnoses Final diagnoses:  Severe sepsis with acute organ dysfunction (HCC)  Lower urinary tract obstruction  Acute pyelonephritis     This chart was dictated using voice recognition software.  Despite best efforts to proofread,  errors can occur which can change the documentation meaning.    Lonell Grandchild, MD 01/12/23 2352

## 2023-01-12 NOTE — ED Notes (Signed)
Patient transported to CT 

## 2023-01-13 DIAGNOSIS — R338 Other retention of urine: Secondary | ICD-10-CM | POA: Diagnosis present

## 2023-01-13 DIAGNOSIS — I5082 Biventricular heart failure: Secondary | ICD-10-CM | POA: Diagnosis present

## 2023-01-13 DIAGNOSIS — E662 Morbid (severe) obesity with alveolar hypoventilation: Secondary | ICD-10-CM | POA: Diagnosis not present

## 2023-01-13 DIAGNOSIS — R6520 Severe sepsis without septic shock: Secondary | ICD-10-CM | POA: Diagnosis present

## 2023-01-13 DIAGNOSIS — R0602 Shortness of breath: Secondary | ICD-10-CM | POA: Diagnosis not present

## 2023-01-13 DIAGNOSIS — N179 Acute kidney failure, unspecified: Secondary | ICD-10-CM | POA: Diagnosis present

## 2023-01-13 DIAGNOSIS — E870 Hyperosmolality and hypernatremia: Secondary | ICD-10-CM | POA: Diagnosis not present

## 2023-01-13 DIAGNOSIS — R531 Weakness: Secondary | ICD-10-CM | POA: Diagnosis present

## 2023-01-13 DIAGNOSIS — N390 Urinary tract infection, site not specified: Secondary | ICD-10-CM

## 2023-01-13 DIAGNOSIS — E860 Dehydration: Secondary | ICD-10-CM | POA: Diagnosis present

## 2023-01-13 DIAGNOSIS — Z9981 Dependence on supplemental oxygen: Secondary | ICD-10-CM | POA: Diagnosis not present

## 2023-01-13 DIAGNOSIS — B961 Klebsiella pneumoniae [K. pneumoniae] as the cause of diseases classified elsewhere: Secondary | ICD-10-CM | POA: Diagnosis present

## 2023-01-13 DIAGNOSIS — N136 Pyonephrosis: Secondary | ICD-10-CM | POA: Diagnosis present

## 2023-01-13 DIAGNOSIS — I959 Hypotension, unspecified: Secondary | ICD-10-CM | POA: Diagnosis present

## 2023-01-13 DIAGNOSIS — J9811 Atelectasis: Secondary | ICD-10-CM | POA: Diagnosis not present

## 2023-01-13 DIAGNOSIS — Z1152 Encounter for screening for COVID-19: Secondary | ICD-10-CM | POA: Diagnosis not present

## 2023-01-13 DIAGNOSIS — N1831 Chronic kidney disease, stage 3a: Secondary | ICD-10-CM | POA: Diagnosis present

## 2023-01-13 DIAGNOSIS — E11649 Type 2 diabetes mellitus with hypoglycemia without coma: Secondary | ICD-10-CM | POA: Diagnosis present

## 2023-01-13 DIAGNOSIS — M549 Dorsalgia, unspecified: Secondary | ICD-10-CM | POA: Diagnosis not present

## 2023-01-13 DIAGNOSIS — J4489 Other specified chronic obstructive pulmonary disease: Secondary | ICD-10-CM | POA: Diagnosis present

## 2023-01-13 DIAGNOSIS — E1122 Type 2 diabetes mellitus with diabetic chronic kidney disease: Secondary | ICD-10-CM | POA: Diagnosis present

## 2023-01-13 DIAGNOSIS — J9611 Chronic respiratory failure with hypoxia: Secondary | ICD-10-CM | POA: Diagnosis present

## 2023-01-13 DIAGNOSIS — Z743 Need for continuous supervision: Secondary | ICD-10-CM | POA: Diagnosis not present

## 2023-01-13 DIAGNOSIS — I130 Hypertensive heart and chronic kidney disease with heart failure and stage 1 through stage 4 chronic kidney disease, or unspecified chronic kidney disease: Secondary | ICD-10-CM | POA: Diagnosis present

## 2023-01-13 DIAGNOSIS — A419 Sepsis, unspecified organism: Secondary | ICD-10-CM | POA: Diagnosis present

## 2023-01-13 DIAGNOSIS — Z7401 Bed confinement status: Secondary | ICD-10-CM | POA: Diagnosis not present

## 2023-01-13 DIAGNOSIS — J90 Pleural effusion, not elsewhere classified: Secondary | ICD-10-CM | POA: Diagnosis not present

## 2023-01-13 DIAGNOSIS — I4821 Permanent atrial fibrillation: Secondary | ICD-10-CM | POA: Diagnosis present

## 2023-01-13 DIAGNOSIS — A4159 Other Gram-negative sepsis: Secondary | ICD-10-CM | POA: Diagnosis present

## 2023-01-13 DIAGNOSIS — I5032 Chronic diastolic (congestive) heart failure: Secondary | ICD-10-CM | POA: Diagnosis present

## 2023-01-13 DIAGNOSIS — I2729 Other secondary pulmonary hypertension: Secondary | ICD-10-CM | POA: Diagnosis present

## 2023-01-13 DIAGNOSIS — D631 Anemia in chronic kidney disease: Secondary | ICD-10-CM | POA: Diagnosis present

## 2023-01-13 LAB — BLOOD CULTURE ID PANEL (REFLEXED) - BCID2

## 2023-01-13 LAB — GLUCOSE, CAPILLARY
Glucose-Capillary: 45 mg/dL — ABNORMAL LOW (ref 70–99)
Glucose-Capillary: 120 mg/dL — ABNORMAL HIGH (ref 70–99)
Glucose-Capillary: 45 mg/dL — ABNORMAL LOW (ref 70–99)
Glucose-Capillary: 50 mg/dL — ABNORMAL LOW (ref 70–99)
Glucose-Capillary: 61 mg/dL — ABNORMAL LOW (ref 70–99)
Glucose-Capillary: 147 mg/dL — ABNORMAL HIGH (ref 70–99)
Glucose-Capillary: 76 mg/dL (ref 70–99)
Glucose-Capillary: 71 mg/dL (ref 70–99)
Glucose-Capillary: 73 mg/dL (ref 70–99)
Glucose-Capillary: 99 mg/dL (ref 70–99)

## 2023-01-13 LAB — PHOSPHORUS: Phosphorus: 1.7 mg/dL — ABNORMAL LOW (ref 2.5–4.6)

## 2023-01-13 LAB — MAGNESIUM: Magnesium: 1.4 mg/dL — ABNORMAL LOW (ref 1.7–2.4)

## 2023-01-13 LAB — CULTURE, BLOOD (ROUTINE X 2)

## 2023-01-13 MED ORDER — MAGNESIUM SULFATE 2 GM/50ML IV SOLN
2.0000 g | Freq: Once | INTRAVENOUS | Status: AC
  Administered 2023-01-13: 2 g via INTRAVENOUS
  Filled 2023-01-13: qty 50

## 2023-01-13 MED ORDER — PROCHLORPERAZINE EDISYLATE 10 MG/2ML IJ SOLN
5.0000 mg | Freq: Four times a day (QID) | INTRAMUSCULAR | Status: DC | PRN
  Administered 2023-01-14: 5 mg via INTRAVENOUS
  Filled 2023-01-13: qty 2

## 2023-01-13 MED ORDER — APIXABAN 5 MG PO TABS
5.0000 mg | ORAL_TABLET | Freq: Two times a day (BID) | ORAL | Status: DC
  Administered 2023-01-13 – 2023-01-19 (×13): 5 mg via ORAL
  Filled 2023-01-13 (×13): qty 1

## 2023-01-13 MED ORDER — POTASSIUM CHLORIDE CRYS ER 20 MEQ PO TBCR
40.0000 meq | EXTENDED_RELEASE_TABLET | ORAL | Status: AC
  Administered 2023-01-13 (×2): 40 meq via ORAL
  Filled 2023-01-13 (×2): qty 2

## 2023-01-13 MED ORDER — DEXTROSE-SODIUM CHLORIDE 5-0.9 % IV SOLN: INTRAVENOUS | Status: AC

## 2023-01-13 MED ORDER — SODIUM CHLORIDE 0.9 % IV SOLN
2.0000 g | INTRAVENOUS | Status: DC
  Administered 2023-01-13 – 2023-01-15 (×3): 2 g via INTRAVENOUS
  Filled 2023-01-13: qty 20

## 2023-01-13 MED ORDER — DEXTROSE 50 % IV SOLN
INTRAVENOUS | Status: AC
  Administered 2023-01-13: 25 mL
  Filled 2023-01-13: qty 50

## 2023-01-13 MED ORDER — TAMSULOSIN HCL 0.4 MG PO CAPS
0.4000 mg | ORAL_CAPSULE | Freq: Every day | ORAL | Status: DC
  Administered 2023-01-13 – 2023-01-18 (×6): 0.4 mg via ORAL
  Filled 2023-01-13 (×6): qty 1

## 2023-01-13 MED ORDER — SENNOSIDES-DOCUSATE SODIUM 8.6-50 MG PO TABS
1.0000 | ORAL_TABLET | Freq: Two times a day (BID) | ORAL | Status: DC
  Administered 2023-01-13 – 2023-01-18 (×8): 1 via ORAL
  Filled 2023-01-13 (×9): qty 1

## 2023-01-13 MED ORDER — ATORVASTATIN CALCIUM 10 MG PO TABS
20.0000 mg | ORAL_TABLET | Freq: Every day | ORAL | Status: DC
  Administered 2023-01-13 – 2023-01-19 (×7): 20 mg via ORAL
  Filled 2023-01-13 (×7): qty 2

## 2023-01-13 MED ORDER — METOPROLOL TARTRATE 5 MG/5ML IV SOLN: 2.5000 mg | Freq: Four times a day (QID) | INTRAVENOUS | Status: DC | PRN

## 2023-01-13 MED ORDER — DIGOXIN 0.25 MG/ML IJ SOLN
0.1250 mg | Freq: Once | INTRAMUSCULAR | Status: AC
  Administered 2023-01-13: 0.125 mg via INTRAVENOUS
  Filled 2023-01-13: qty 2

## 2023-01-13 MED ORDER — ALLOPURINOL 100 MG PO TABS
100.0000 mg | ORAL_TABLET | Freq: Every day | ORAL | Status: DC
  Administered 2023-01-13 – 2023-01-19 (×7): 100 mg via ORAL
  Filled 2023-01-13 (×7): qty 1

## 2023-01-13 MED ORDER — ACETAMINOPHEN 325 MG PO TABS
650.0000 mg | ORAL_TABLET | Freq: Four times a day (QID) | ORAL | Status: DC | PRN
  Administered 2023-01-13 – 2023-01-18 (×7): 650 mg via ORAL
  Filled 2023-01-13 (×7): qty 2

## 2023-01-13 MED ORDER — INSULIN ASPART 100 UNIT/ML IJ SOLN
0.0000 [IU] | Freq: Three times a day (TID) | INTRAMUSCULAR | Status: DC
  Administered 2023-01-15 – 2023-01-19 (×3): 1 [IU] via SUBCUTANEOUS

## 2023-01-13 MED ORDER — DEXTROSE 50 % IV SOLN
25.0000 mL | Freq: Once | INTRAVENOUS | Status: AC
  Administered 2023-01-13: 25 mL via INTRAVENOUS
  Filled 2023-01-13: qty 50

## 2023-01-13 MED ORDER — METOPROLOL TARTRATE 25 MG PO TABS
25.0000 mg | ORAL_TABLET | Freq: Two times a day (BID) | ORAL | Status: DC
  Administered 2023-01-13 (×2): 25 mg via ORAL
  Filled 2023-01-13 (×2): qty 1

## 2023-01-13 MED ORDER — POLYETHYLENE GLYCOL 3350 17 G PO PACK: 17.0000 g | PACK | Freq: Every day | ORAL | Status: DC | PRN

## 2023-01-13 MED ORDER — MELATONIN 5 MG PO TABS
5.0000 mg | ORAL_TABLET | Freq: Every evening | ORAL | Status: DC | PRN
  Administered 2023-01-13: 5 mg via ORAL
  Filled 2023-01-13: qty 1

## 2023-01-13 MED ORDER — POTASSIUM PHOSPHATES 15 MMOLE/5ML IV SOLN
30.0000 mmol | Freq: Once | INTRAVENOUS | Status: AC
  Administered 2023-01-13: 30 mmol via INTRAVENOUS
  Filled 2023-01-13: qty 10

## 2023-01-13 MED ORDER — MUSCLE RUB 10-15 % EX CREA
TOPICAL_CREAM | CUTANEOUS | Status: DC | PRN
  Filled 2023-01-13: qty 85

## 2023-01-13 NOTE — Plan of Care (Signed)
Problem: Education: Goal: Knowledge of General Education information will improve Description: Including pain rating scale, medication(s)/side effects and non-pharmacologic comfort measures 01/13/2023 1956 by Louanne Belton, RN Outcome: Not Progressing 01/13/2023 1955 by Louanne Belton, RN Outcome: Progressing   Problem: Health Behavior/Discharge Planning: Goal: Ability to manage health-related needs will improve 01/13/2023 1956 by Louanne Belton, RN Outcome: Not Progressing 01/13/2023 1955 by Louanne Belton, RN Outcome: Progressing   Problem: Clinical Measurements: Goal: Ability to maintain clinical measurements within normal limits will improve 01/13/2023 1956 by Louanne Belton, RN Outcome: Not Progressing 01/13/2023 1955 by Louanne Belton, RN Outcome: Progressing Goal: Will remain free from infection 01/13/2023 1956 by Louanne Belton, RN Outcome: Not Progressing 01/13/2023 1955 by Louanne Belton, RN Outcome: Progressing Goal: Diagnostic test results will improve 01/13/2023 1956 by Louanne Belton, RN Outcome: Not Progressing 01/13/2023 1955 by Louanne Belton, RN Outcome: Progressing Goal: Respiratory complications will improve 01/13/2023 1956 by Louanne Belton, RN Outcome: Not Progressing 01/13/2023 1955 by Louanne Belton, RN Outcome: Progressing Goal: Cardiovascular complication will be avoided 01/13/2023 1956 by Louanne Belton, RN Outcome: Not Progressing 01/13/2023 1955 by Louanne Belton, RN Outcome: Progressing   Problem: Activity: Goal: Risk for activity intolerance will decrease 01/13/2023 1956 by Louanne Belton, RN Outcome: Not Progressing 01/13/2023 1955 by Louanne Belton, RN Outcome: Progressing   Problem: Nutrition: Goal: Adequate nutrition will be maintained 01/13/2023 1956 by Louanne Belton, RN Outcome: Not Progressing 01/13/2023 1955 by Louanne Belton, RN Outcome: Progressing   Problem: Coping: Goal: Level of anxiety will decrease 01/13/2023 1956 by Louanne Belton, RN Outcome: Not Progressing 01/13/2023 1955 by Louanne Belton, RN Outcome: Progressing   Problem: Elimination: Goal: Will not experience complications related to bowel motility 01/13/2023 1956 by Louanne Belton, RN Outcome: Not Progressing 01/13/2023 1955 by Louanne Belton, RN Outcome: Progressing Goal: Will not experience complications related to urinary retention 01/13/2023 1956 by Louanne Belton, RN Outcome: Not Progressing 01/13/2023 1955 by Louanne Belton, RN Outcome: Progressing   Problem: Pain Managment: Goal: General experience of comfort will improve 01/13/2023 1956 by Louanne Belton, RN Outcome: Not Progressing 01/13/2023 1955 by Louanne Belton, RN Outcome: Progressing   Problem: Safety: Goal: Ability to remain free from injury will improve 01/13/2023 1956 by Louanne Belton, RN Outcome: Not Progressing 01/13/2023 1955 by Louanne Belton, RN Outcome: Progressing   Problem: Skin Integrity: Goal: Risk for impaired skin integrity will decrease 01/13/2023 1956 by Louanne Belton, RN Outcome: Not Progressing 01/13/2023 1955 by Louanne Belton, RN Outcome: Progressing   Problem: Education: Goal: Ability to describe self-care measures that may prevent or decrease complications (Diabetes Survival Skills Education) will improve 01/13/2023 1956 by Louanne Belton, RN Outcome: Not Progressing 01/13/2023 1955 by Louanne Belton, RN Outcome: Progressing Goal: Individualized Educational Video(s) 01/13/2023 1956 by Louanne Belton, RN Outcome: Not Progressing 01/13/2023 1955 by Louanne Belton, RN Outcome: Progressing   Problem: Coping: Goal: Ability to adjust to condition or change in health will improve 01/13/2023 1956 by Louanne Belton, RN Outcome: Not Progressing 01/13/2023 1955 by Louanne Belton, RN Outcome: Progressing   Problem: Fluid Volume: Goal: Ability to maintain a balanced intake and output will improve 01/13/2023 1956 by Louanne Belton, RN Outcome: Not  Progressing 01/13/2023 1955 by Louanne Belton, RN Outcome: Progressing   Problem: Health Behavior/Discharge Planning: Goal: Ability to identify and utilize available resources  and services will improve 01/13/2023 1956 by Louanne Belton, RN Outcome: Not Progressing 01/13/2023 1955 by Louanne Belton, RN Outcome: Progressing Goal: Ability to manage health-related needs will improve 01/13/2023 1956 by Louanne Belton, RN Outcome: Not Progressing 01/13/2023 1955 by Louanne Belton, RN Outcome: Progressing   Problem: Metabolic: Goal: Ability to maintain appropriate glucose levels will improve 01/13/2023 1956 by Louanne Belton, RN Outcome: Not Progressing 01/13/2023 1955 by Louanne Belton, RN Outcome: Progressing   Problem: Nutritional: Goal: Maintenance of adequate nutrition will improve 01/13/2023 1956 by Louanne Belton, RN Outcome: Not Progressing 01/13/2023 1955 by Louanne Belton, RN Outcome: Progressing Goal: Progress toward achieving an optimal weight will improve 01/13/2023 1956 by Louanne Belton, RN Outcome: Not Progressing 01/13/2023 1955 by Louanne Belton, RN Outcome: Progressing   Problem: Skin Integrity: Goal: Risk for impaired skin integrity will decrease 01/13/2023 1956 by Louanne Belton, RN Outcome: Not Progressing 01/13/2023 1955 by Louanne Belton, RN Outcome: Progressing   Problem: Tissue Perfusion: Goal: Adequacy of tissue perfusion will improve 01/13/2023 1956 by Louanne Belton, RN Outcome: Not Progressing 01/13/2023 1955 by Louanne Belton, RN Outcome: Progressing

## 2023-01-13 NOTE — H&P (Addendum)
History and Physical    [name] [name] GNF:[id number] DOB: 13-May-1946 DOA: 01/12/2023  PCP: [name] Levins, MD (Confirm with patient/family/NH records and if not entered, this has to be entered at [facility] point of entry) Patient coming from: Home  I have personally briefly reviewed patient's old medical records in [facility]  Chief Complaint: Belly hurts, burning sensation when urinating   HPI: [name] [name] is a 77 y.o. male with medical history significant of PAF on Eliquis, chronic HFpEF, COPD Gold stage II, chronic hypoxic respiratory failure on 4 L continuously, IIDM, presented with fever chills, dysuria.  Symptoms started yesterday with acute lower abdominal pain and burning sensation when urinate, started to spell fever and chills and night and sent for ED by nursing home staff.  History of kidney stones but denies any recent passing stones in the urine.  ED Course: Spiked fever 101.6 with tachycardia in rapid A-fib, blood pressure borderline low and O2 saturation at baseline 97% on 4 L.  UA compatible with UTI with hematuria, CT abdomen pelvis showed left kidney stranding and mild hydronephrosis but no significant obstruction.  WBC 18.9, VBG 7.44/46/55  Patient was given IV bolus of 2 L and started on broad-spectrum antibiotics of vancomycin cefepime and Flagyl.  Review of Systems: As per HPI otherwise 14 point review of systems negative.    Past Medical History:  Diagnosis Date   Acute right-sided CHF (congestive heart failure) (HCC)    a. 01/2013.   Arthritis    "knees and hands; thoracic area of the spine" (01/05/2013)   BRONCHITIS, CHRONIC 01/02/2009   COMMON MIGRAINE    "none since treating for high BP"   Complication of anesthesia    "aspiration pneumonia after hand OR" (01/05/2013)   DIABETES MELLITUS, TYPE II    GOUT    HYPERLIPIDEMIA    HYPERTENSION    Long term (current) use of anticoagulants    Morbid obesity (HCC)    OBESITY HYPOVENTILATION SYNDROME    a. on home O2.    On home oxygen therapy    OSA (obstructive sleep apnea)    Permanent atrial fibrillation (HCC)    PROSTATE SPECIFIC ANTIGEN, ELEVATED 06/09/2007   Pulmonary HTN (HCC)    a. multifactorial including obstructive sleep apnea, obesity hypoventilation syndrome and possible pulmonary venous hypertension.   SKIN RASH 03/12/2010   Unspecified hearing loss     Past Surgical History:  Procedure Laterality Date   APPENDECTOMY  1974   APPLICATION OF ROBOTIC ASSISTANCE FOR SPINAL PROCEDURE N/A 11/23/2022   Procedure: APPLICATION OF ROBOTIC ASSISTANCE FOR SPINAL PROCEDURE;  Surgeon: Lisbeth Renshaw, MD;  Location: [facility] OR;  Service: Neurosurgery;  Laterality: N/A;   CARDIOVERSION  05/21/2008; ~ 06/2008   HERNIA REPAIR     INGUINAL HERNIA REPAIR Right    RIGHT HEART CATHETERIZATION N/A 01/08/2013   Procedure: RIGHT HEART CATH;  Surgeon: Vesta Mixer, MD;  Location: [facility] C[facility] LAB;  Service: Cardiovascular;  Laterality: N/A;   TENDON REPAIR Right 2009   "lacerated his tendon and pulley" Dr. Izora Ribas   UMBILICAL HERNIA REPAIR       reports that he quit smoking about 44 years ago. His smoking use included cigarettes. He has a 15.00 pack-year smoking history. He has never used smokeless tobacco. He reports current alcohol use. He reports that he does not use drugs.  Allergies  Allergen Reactions   Codeine     Altered mental status "goofy"   Heparin Rash  Abdominal rash 01/2013    Family History  Problem Relation Age of Onset   Alzheimer's disease Father    Prostate cancer Father    Cancer Other        Breast Cancer, <50 yo 1st degree relative   Coronary artery disease Other        Male, 1st degree relative     Prior to Admission medications   Medication Sig Start Date End Date Taking? Authorizing Provider  allopurinol (ZYLOPRIM) 100 MG tablet TAKE 1 TABLET DAILY 01/25/22   [name] Levins, MD  atorvastatin (LIPITOR) 20 MG tablet TAKE 1 TABLET DAILY 01/25/22   [name] Levins, MD  cholecalciferol  (VITAMIN D3) 25 MCG (1000 UNIT) tablet Take 2,000 Units by mouth daily.    [provider]  diltiazem (CARDIZEM CD) 180 MG 24 hr capsule 1 tab by mouth once daily 10/26/22   [name] Levins, MD  ELIQUIS 5 MG TABS tablet TAKE 1 TABLET TWICE A DAY 01/25/22   Lewayne Bunting, MD  glipiZIDE (GLUCOTROL XL) 10 MG 24 hr tablet Take 1 tablet (10 mg total) by mouth daily with breakfast. 07/15/22   [name] Levins, MD  Lancets MISC Use as directed once daily  E11.9 04/19/17   [name] Levins, MD  metFORMIN (GLUCOPHAGE-XR) 500 MG 24 hr tablet TAKE 2 TABLETS IN THE MORNING 01/25/22   [name] Levins, MD  metoprolol tartrate (LOPRESSOR) 25 MG tablet TAKE 1 TABLET(25 MG) BY MOUTH TWICE DAILY 07/12/22   [name] Levins, MD  Misc Natural Products (TART CHERRY ADVANCED) CAPS Take 1 capsule by mouth daily.    [provider]  OXYGEN Inhale 3-5 L into the lungs daily. 3L Daily  4L Resting  5L Exertion    [provider]  senna-docusate (SENOKOT-S) 8.6-50 MG tablet Take 1 tablet by mouth 2 (two) times daily. 12/05/22   Kathlen Mody, MD  torsemide (DEMADEX) 20 MG tablet Take 2 tablets (40 mg total) by mouth 2 (two) times daily. 12/06/22   Almon Hercules, MD    Physical Exam: Vitals:   01/13/23 0231 01/13/23 0328 01/13/23 0336 01/13/23 0736  BP:  96/66  111/73  Pulse:  (!) 112 (!) 101 (!) 121  Resp:  (!) 24 (!) 23 17  Temp: 99.5 F (37.5 C) 98.7 F (37.1 C)  98.1 F (36.7 C)  TempSrc: Oral Oral  Oral  SpO2:  95%  98%  Weight:  113 kg    Height:  5\' 8"  (1.727 m)      Constitutional: NAD, calm, comfortable Vitals:   01/13/23 0231 01/13/23 0328 01/13/23 0336 01/13/23 0736  BP:  96/66  111/73  Pulse:  (!) 112 (!) 101 (!) 121  Resp:  (!) 24 (!) 23 17  Temp: 99.5 F (37.5 C) 98.7 F (37.1 C)  98.1 F (36.7 C)  TempSrc: Oral Oral  Oral  SpO2:  95%  98%  Weight:  113 kg    Height:  5\' 8"  (1.727 m)     Eyes: PERRL, lids and conjunctivae normal ENMT: Mucous membranes are moist. Posterior  pharynx clear of any exudate or lesions.Normal dentition.  Neck: normal, supple, no masses, no thyromegaly Respiratory: clear to auscultation bilaterally, no wheezing, no crackles. Normal respiratory effort. No accessory muscle use.  Cardiovascular: Irregular heartbeat, no murmurs / rubs / gallops. No extremity edema. 2+ pedal pulses. No carotid bruits.  Abdomen: tenderness on suprapubic area, no rebound no guarding, no masses palpated. No hepatosplenomegaly.  Bowel sounds positive.  Musculoskeletal: no clubbing / cyanosis. No joint deformity upper and lower extremities. Good ROM, no contractures. Normal muscle tone.  Skin: no rashes, lesions, ulcers. No induration Neurologic: CN 2-12 grossly intact. Sensation intact, DTR normal. Strength 5/5 in all 4.  Psychiatric: Normal judgment and insight. Alert and oriented x 3. Normal mood.    Labs on Admission: I have personally reviewed following labs and imaging studies  CBC: Recent Labs  Lab 01/12/23 2034 01/12/23 2128  WBC 18.9*  --   NEUTROABS 15.9*  --   HGB 12.9* 11.2*  HCT 40.0 33.0*  MCV 89.5  --   PLT 314  --    Basic Metabolic Panel: Recent Labs  Lab 01/12/23 2034 01/12/23 2128  NA 140 140  K 3.5 3.0*  CL 100  --   CO2 28  --   GLUCOSE 105*  --   BUN 37*  --   CREATININE 1.98*  --   CALCIUM 9.7  --    GFR: Estimated Creatinine Clearance: 38.1 mL/min (A) (by C-G formula based on SCr of 1.98 mg/dL (H)). Liver Function Tests: Recent Labs  Lab 01/12/23 2034  AST 18  ALT 26  ALKPHOS 56  BILITOT 0.8  PROT 7.3  ALBUMIN 2.9*   No results for input(s): "LIPASE", "AMYLASE" in the last 168 hours. No results for input(s): "AMMONIA" in the last 168 hours. Coagulation Profile: Recent Labs  Lab 01/12/23 2034  INR 1.5*   Cardiac Enzymes: No results for input(s): "CKTOTAL", "CKMB", "CKMBINDEX", "TROPONINI" in the last 168 hours. BNP (last 3 results) No results for input(s): "PROBNP" in the last 8760 hours. HbA1C: No  results for input(s): "HGBA1C" in the last 72 hours. CBG: Recent Labs  Lab 01/13/23 0615 01/13/23 0646 01/13/23 1058 01/13/23 1100  GLUCAP 45* 120* 45* 50*   Lipid Profile: No results for input(s): "CHOL", "HDL", "LDLCALC", "TRIG", "CHOLHDL", "LDLDIRECT" in the last 72 hours. Thyroid Function Tests: No results for input(s): "TSH", "T4TOTAL", "FREET4", "T3FREE", "THYROIDAB" in the last 72 hours. Anemia Panel: No results for input(s): "VITAMINB12", "FOLATE", "FERRITIN", "TIBC", "IRON", "RETICCTPCT" in the last 72 hours. Urine analysis:    Component Value Date/Time   COLORURINE ORANGE (A) 01/12/2023 2237   APPEARANCEUR CLOUDY (A) 01/12/2023 2237   LABSPEC 1.014 01/12/2023 2237   PHURINE 6.5 01/12/2023 2237   GLUCOSEU NEGATIVE 01/12/2023 2237   GLUCOSEU NEGATIVE 10/08/2022 1611   HGBUR LARGE (A) 01/12/2023 2237   BILIRUBINUR NEGATIVE 01/12/2023 2237   KETONESUR NEGATIVE 01/12/2023 2237   PROTEINUR 100 (A) 01/12/2023 2237   UROBILINOGEN 0.2 10/08/2022 1611   NITRITE POSITIVE (A) 01/12/2023 2237   LEUKOCYTESUR LARGE (A) 01/12/2023 2237    Radiological Exams on Admission: CT CHEST ABDOMEN PELVIS W CONTRAST  Result Date: 01/12/2023 CLINICAL DATA:  Sepsis, fever and chills. EXAM: CT CHEST, ABDOMEN, AND PELVIS WITH CONTRAST TECHNIQUE: Multidetector CT imaging of the chest, abdomen and pelvis was performed following the standard protocol during bolus administration of intravenous contrast. RADIATION DOSE REDUCTION: This exam was performed according to the departmental dose-optimization program which includes automated exposure control, adjustment of the mA and/or kV according to patient size and/or use of iterative reconstruction technique. CONTRAST:  70mL OMNIPAQUE IOHEXOL 300 MG/ML  SOLN COMPARISON:  None Available. FINDINGS: CT CHEST FINDINGS Cardiovascular: The heart is enlarged and there is no pericardial effusion. A few scattered coronary artery calcifications are noted. There is  atherosclerotic calcification of the aorta without evidence of aneurysm. The pulmonary trunk is normal  in caliber. Mediastinum/Nodes: No mediastinal or axillary lymphadenopathy. A prominent lymph node is present at the right hilum measuring 1.2 cm. The thyroid gland, trachea, and esophagus are within normal limits. Lungs/Pleura: Bronchial wall thickening is noted in the lower lobes bilaterally with patchy atelectasis or infiltrate. No effusion or pneumothorax. Musculoskeletal: Degenerative changes are present in the thoracic spine. No acute fracture. Spinal fusion hardware is noted in the thoracolumbar spine. CT ABDOMEN PELVIS FINDINGS Hepatobiliary: Subcentimeter hypodensities are present in the liver, most likely representing cysts or hemangiomas. No biliary ductal dilatation. The gallbladder is without stones. Pancreas: Unremarkable. No pancreatic ductal dilatation or surrounding inflammatory changes. Spleen: Normal in size without focal abnormality. Adrenals/Urinary Tract: Fat attenuation nodules are present in the left adrenal gland measuring up to 1.3 cm, compatible with myelolipomas. There is a 1.8 cm nodule in the left adrenal gland measuring 1.83 cm with attenuation of 8 Hounsfield units, likely adenoma. The right adrenal gland is within normal limits. The kidneys enhance symmetrically. Multiple cysts are noted in the left kidney. No renal calculus bilaterally. No obstructive uropathy on the right. There is mild hydroureteronephrosis on the left with perinephric and periureteral fat stranding. No significant bladder wall thickening. There is diffuse perivesicular fat stranding. Stomach/Bowel: Stomach is within normal limits. Appendix is not seen. No evidence of bowel wall thickening, distention, or inflammatory changes. No free air or pneumatosis. A few scattered diverticula are present along the colon without evidence of diverticulitis. Vascular/Lymphatic: Aortic atherosclerosis. Prominent lymph nodes are  present in the retroperitoneum in the periaortic space and in the perivesicular region which may be reactive. Reproductive: The prostate gland is mildly enlarged. Other: No abdominopelvic ascites. Musculoskeletal: Degenerative changes are present in the lumbar spine and spinal fusion hardware is noted. A cystic structure is present in the subcutaneous tissues over the gluteal region on the left posteriorly, possible large sebaceous cyst. IMPRESSION: 1. Mild hydroureteronephrosis on the left with no obstructing stone. There is perinephric and periureteral fat stranding on the left with perivesicular fat stranding about the bladder and local lymphadenopathy, which may be infectious or inflammatory. 2. Bronchial wall thickening in the lower lobes bilaterally with mild atelectasis or infiltrate. 3. Left adrenal nodules, compatible with adenoma and myelolipomas. 4. Coronary artery calcifications. 5. Aortic atherosclerosis. Electronically Signed   By: Thornell Sartorius M.D.   On: 01/12/2023 22:29   CT Head Wo Contrast  Result Date: 01/12/2023 CLINICAL DATA:  Fever and chills.  Mental status change EXAM: CT HEAD WITHOUT CONTRAST TECHNIQUE: Contiguous axial images were obtained from the base of the skull through the vertex without intravenous contrast. RADIATION DOSE REDUCTION: This exam was performed according to the departmental dose-optimization program which includes automated exposure control, adjustment of the mA and/or kV according to patient size and/or use of iterative reconstruction technique. COMPARISON:  CT head 11/18/2022 FINDINGS: Brain: No intracranial hemorrhage, mass effect, or evidence of acute infarct. No hydrocephalus. No extra-axial fluid collection. Generalized cerebral atrophy. Ill-defined hypoattenuation within the cerebral white matter is nonspecific but consistent with chronic small vessel ischemic disease. Vascular: No hyperdense vessel. Intracranial arterial calcification. Skull: No fracture or  focal lesion. Sinuses/Orbits: No acute finding. Paranasal sinuses and mastoid air cells are well aerated. Other: None. IMPRESSION: 1. No evidence of acute intracranial abnormality. Electronically Signed   By: Minerva Fester M.D.   On: 01/12/2023 22:20   DG Chest Port 1 View  Result Date: 01/12/2023 CLINICAL DATA:  Shortness of breath EXAM: PORTABLE CHEST 1 VIEW COMPARISON:  Chest radiograph  dated 11/22/2022 FINDINGS: Low lung volumes. Bibasilar patchy opacities. Small bilateral pleural effusions. No pneumothorax. Similar enlarged cardiomediastinal silhouette. Partially imaged lumbar spinal fixation hardware appears intact. IMPRESSION: 1. Low lung volumes with bibasilar patchy opacities, may represent atelectasis, aspiration, or pneumonia. 2. Small bilateral pleural effusions. 3. Similar cardiomegaly. Electronically Signed   By: Agustin Cree M.D.   On: 01/12/2023 21:06    EKG: Independently reviewed.  A-fib on telemetry, EKG pending  Assessment/Plan Principal Problem:   Complicated UTI (urinary tract infection) Active Problems:   Chronic diastolic CHF (congestive heart failure) (HCC)   Chronic a-fib (HCC)   Sepsis (HCC)  (please populate well all problems here in Problem List. (For example, if patient is on BP meds at home and you resume or decide to hold them, it is a problem that needs to be her. Same for CAD, COPD, HLD and so on)  Severe sepsis -Evidenced by fever, leukocytosis, tachycardia, with signs of endorgan damage of AKI, source of infection is acute left-sided pyelonephritis/complicated UTI -Clinically still appears to be volume contracted, decided to continue IV fluid -De-escalate antibiotics to ceftriaxone 2 g daily  AKI -Probably both prerenal and post renal, probably secondary to combined effect of UTI and temporarily left obstructive uropathy from a passing kidney stone which has improved. -Continue IV fluid and repeat BMP tomorrow  Acute urinary retention -Likely secondary to  acute UTI and pyelonephritis, Foley catheter placed -Expect Foley stay in for 3 days then void trial after that  A-fib with RVR -Probably secondary to sepsis -With borderline low blood pressure reading, will hold off home dose of Cardizem -Continue metoprolol 25 mg twice daily, add as needed Lopressor -1 dose of IV digoxin given -Continue Eliquis  Hypokalemia -P.o. replacement -Magnesium and phosphorus level pending  IIDM with hypoglycemia -Hold off home p.o. diabetic medications -Sliding scale  COPD Gold stage II -No symptoms or signs of acute exacerbation -Continue as needed breathing medications  Chronic HFpEF -With borderline blood pressure reading and AKI, will hold off torsemide and Cardizem -Continue IV fluid -Recheck chest x-ray and kidney function tomorrow  Gout -Continue allopurinol  DVT prophylaxis: Eliquis Code Status: Full code Family Communication: None at bedside Disposition Plan: Patient is sick with sepsis, from acute pyelonephritis requiring IV antibiotics, and rapid A-fib, expect more than 2 midnight hospital stay Consults called: None Admission status: PCU   Emeline General MD [facility] Pager [phone]  01/13/2023, 11:21 AM

## 2023-01-13 NOTE — Progress Notes (Signed)
PHARMACY - PHYSICIAN COMMUNICATION CRITICAL VALUE ALERT - BLOOD CULTURE IDENTIFICATION (BCID)  [name] [name] is an 77 y.o. male who presented to [facility] on 01/12/2023 with a chief complaint of abd pain + burning with urination  Assessment: 77 YOM on treatment for UTI/pyelo and now with 3 of 4 blood cultures growing GNR with BCID detecting Kleb PNA, no resistance mechanisms noted.  Name of physician (or Provider) Contacted: Zhang  Current antibiotics: Rocephin 2g IV every 24 hours  Changes to prescribed antibiotics recommended:  Patient is on recommended antibiotics - No changes needed  Results for orders placed or performed during the hospital encounter of 01/12/23  Blood Culture ID Panel (Reflexed) (Collected: 01/12/2023  8:24 PM)  Result Value Ref Range   Enterococcus faecalis NOT DETECTED NOT DETECTED   Enterococcus Faecium NOT DETECTED NOT DETECTED   Listeria monocytogenes NOT DETECTED NOT DETECTED   Staphylococcus species NOT DETECTED NOT DETECTED   Staphylococcus aureus (BCID) NOT DETECTED NOT DETECTED   Staphylococcus epidermidis NOT DETECTED NOT DETECTED   Staphylococcus lugdunensis NOT DETECTED NOT DETECTED   Streptococcus species NOT DETECTED NOT DETECTED   Streptococcus agalactiae NOT DETECTED NOT DETECTED   Streptococcus pneumoniae NOT DETECTED NOT DETECTED   Streptococcus pyogenes NOT DETECTED NOT DETECTED   A.calcoaceticus-baumannii NOT DETECTED NOT DETECTED   Bacteroides fragilis NOT DETECTED NOT DETECTED   Enterobacterales DETECTED (A) NOT DETECTED   Enterobacter cloacae complex NOT DETECTED NOT DETECTED   Escherichia coli NOT DETECTED NOT DETECTED   Klebsiella aerogenes NOT DETECTED NOT DETECTED   Klebsiella oxytoca NOT DETECTED NOT DETECTED   Klebsiella pneumoniae DETECTED (A) NOT DETECTED   Proteus species NOT DETECTED NOT DETECTED   Salmonella species NOT DETECTED NOT DETECTED   Serratia marcescens NOT DETECTED NOT DETECTED   Haemophilus influenzae NOT DETECTED  NOT DETECTED   Neisseria meningitidis NOT DETECTED NOT DETECTED   Pseudomonas aeruginosa NOT DETECTED NOT DETECTED   Stenotrophomonas maltophilia NOT DETECTED NOT DETECTED   Candida albicans NOT DETECTED NOT DETECTED   Candida auris NOT DETECTED NOT DETECTED   Candida glabrata NOT DETECTED NOT DETECTED   Candida krusei NOT DETECTED NOT DETECTED   Candida parapsilosis NOT DETECTED NOT DETECTED   Candida tropicalis NOT DETECTED NOT DETECTED   Cryptococcus neoformans/gattii NOT DETECTED NOT DETECTED   CTX-M ESBL NOT DETECTED NOT DETECTED   Carbapenem resistance IMP NOT DETECTED NOT DETECTED   Carbapenem resistance KPC NOT DETECTED NOT DETECTED   Carbapenem resistance NDM NOT DETECTED NOT DETECTED   Carbapenem resist OXA 48 LIKE NOT DETECTED NOT DETECTED   Carbapenem resistance VIM NOT DETECTED NOT DETECTED    Thank you for allowing pharmacy to be a part of this patient's care.  Georgina Pillion, PharmD, BCPS Infectious Diseases Clinical Pharmacist 01/13/2023 2:15 PM   **Pharmacist phone directory can now be found on [email or web address] ([email or web address]).  Listed under [facility].

## 2023-01-13 NOTE — Progress Notes (Addendum)
Nurse called and reported several borderline FS reading in the 45-60 range and patient has no complaints. One dose of half amp D50 ordered and switch IVF to D5NS.  Microbiology reported Klebsiella bacteremia, pansensitive, likely from pyelonephritis, will continue Ceftriaxone. Repeat blood culture tomorrow.

## 2023-01-13 NOTE — Progress Notes (Signed)
   01/13/23 0336  Assess: MEWS Score  Pulse Rate (!) 101  Resp (!) 23  Assess: MEWS Score  MEWS Temp 0  MEWS Systolic 1  MEWS Pulse 1  MEWS RR 1  MEWS LOC 0  MEWS Score 3  MEWS Score Color Yellow  Assess: if the MEWS score is Yellow or Red  Were vital signs taken at a resting state? No  Focused Assessment Change from prior assessment (see assessment flowsheet)  Does the patient meet 2 or more of the SIRS criteria? No  MEWS guidelines implemented  No, other (Comment) (pt's R are improved and HR is better, pt was just transfered from the stretcher to the bed right before the VS were taken)  Assess: SIRS CRITERIA  SIRS Temperature  0  SIRS Pulse 1  SIRS Respirations  1  SIRS WBC 1  SIRS Score Sum  3

## 2023-01-13 NOTE — ED Provider Notes (Signed)
  Provider Note MRN:  [id number]  Arrival date & time: 01/13/23    ED Course and Medical Decision Making  Assumed care from evening shift physician at shift change.  Sepsis with urinary obstruction, doing much better after fluids, Foley catheter.  On my assessment patient is having active rigors, hemodynamically doing well, providing Tylenol, blankets.  Accepted for admission by the hospitalist abdominal service.  .Critical Care  Performed by: Sabas Sous, MD Authorized by: Sabas Sous, MD   Critical care provider statement:    Critical care time (minutes):  35   Critical care was necessary to treat or prevent imminent or life-threatening deterioration of the following conditions:  Sepsis   Critical care was time spent personally by me on the following activities:  Development of treatment plan with patient or surrogate, discussions with consultants, evaluation of patient's response to treatment, examination of patient, ordering and review of laboratory studies, ordering and review of radiographic studies, ordering and performing treatments and interventions, pulse oximetry, re-evaluation of patient's condition and review of old charts   Final Clinical Impressions(s) / ED Diagnoses     ICD-10-CM   1. Severe sepsis with acute organ dysfunction (HCC)  A41.9    R65.20     2. Lower urinary tract obstruction  N13.9     3. Acute pyelonephritis  N10       ED Discharge Orders     None       Discharge Instructions   None     Elmer Sow. Pilar Plate, MD [facility] Emergency Medicine [facility] [email or web address]    Sabas Sous, MD 01/13/23 [phone]

## 2023-01-13 NOTE — Progress Notes (Signed)
Plan of Care Note for accepted transfer   Patient: [name] [name] MRN: [id number]   DOA: 01/12/2023  Facility requesting transfer: [facility]  Requesting Provider: Dr. Pilar Plate  Reason for transfer: Sepsis secondary to complicated UTI, AKI, acute urinary retention  Facility course: 77 year old male with a past medical history of morbid obesity, OSA, OHS, COPD, severe PAH, chronic diastolic CHF, chronic right HF, chronic hypoxic RF on 3-6 L O2, type 2 diabetes, hypertension, hyperlipidemia, permanent A-fib on Eliquis, migraine, arthritis, gout, CKD stage 3a presented to ED via EMS for evaluation of fever, chills, and generalized weakness.  Vital signs on arrival to the ED: Temperature 100.7 F, pulse 135, respiratory rate 22, blood pressure 90/73, SpO2 97% on 4 L [place].  Patient desatted to upper 80s on 4 L [place], increased to 5 L.  Labs significant for WBC 18.9, lactate 2.3, creatinine 1.9 (baseline around 1.2), COVID/influenza/RSV PCR negative, UA suggestive of infection.  Urine and blood cultures pending. Foley catheter placed due to acute urinary retention.  CT chest/abdomen/pelvis showing: "IMPRESSION: 1. Mild hydroureteronephrosis on the left with no obstructing stone. There is perinephric and periureteral fat stranding on the left with perivesicular fat stranding about the bladder and local lymphadenopathy, which may be infectious or inflammatory. 2. Bronchial wall thickening in the lower lobes bilaterally with mild atelectasis or infiltrate. 3. Left adrenal nodules, compatible with adenoma and myelolipomas. 4. Coronary artery calcifications. 5. Aortic atherosclerosis."  CT head negative for acute intracranial abnormality.  Patient was given Tylenol, vancomycin, cefepime, metronidazole, and 30 cc/kg IV fluid boluses.  Repeat lactate 1.3 and blood pressure improved after IV fluids.  Plan of care: The patient is accepted for admission to Progressive unit, at [facility].   [facility] will  assume care on arrival to accepting facility. Until arrival, care as per EDP. However, [facility] available 24/7 for questions and assistance.  Author: John Giovanni, MD 01/13/2023  Check [email or web address] for on-call coverage.  Nursing staff, Please call [facility] Admits & Consults System-Wide number on Amion as soon as patient's arrival, so appropriate admitting provider can evaluate the pt.

## 2023-01-13 NOTE — Inpatient Diabetes Management (Signed)
Inpatient Diabetes Program Recommendations  AACE/ADA: New Consensus Statement on Inpatient Glycemic Control (2015)  Target Ranges:  Prepandial:   less than 140 mg/dL      Peak postprandial:   less than 180 mg/dL (1-2 hours)      Critically ill patients:  140 - 180 mg/dL   Lab Results  Component Value Date   GLUCAP 50 (L) 01/13/2023   HGBA1C 7.0 (H) 10/08/2022    Review of Glycemic Control  Latest Reference Range & Units 01/13/23 10:58 01/13/23 11:00  Glucose-Capillary 70 - 99 mg/dL 45 (L) 50 (L)  (L): Data is abnormally low Diabetes history: Type 2 DM Outpatient Diabetes medications: Glipizide 10 mg QD, Metformin 1000 mg BID Current orders for Inpatient glycemic control: Novolog 0-9 units TID  Inpatient Diabetes Program Recommendations:    Noted hypoglycemia, assuming due to sepsis process. At this time, may want to consider changing to CBGs Q4H.   Thanks, Lujean Rave, MSN, RNC-OB Diabetes Coordinator [phone] (8a-5p)

## 2023-01-13 NOTE — Progress Notes (Signed)
   01/13/23 0328  Assess: MEWS Score  Temp 98.7 F (37.1 C)  BP 96/66  Pulse Rate (!) 112  Resp (!) 24  SpO2 95 %  O2 Device Nasal Cannula  O2 Flow Rate (L/min) 4 L/min  Assess: MEWS Score  MEWS Temp 0  MEWS Systolic 1  MEWS Pulse 2  MEWS RR 1  MEWS LOC 0  MEWS Score 4  MEWS Score Color Red  Assess: if the MEWS score is Yellow or Red  Were vital signs taken at a resting state? No  Focused Assessment No change from prior assessment  Does the patient meet 2 or more of the SIRS criteria? No  MEWS guidelines implemented  No, previously red, continue vital signs every 4 hours  Assess: SIRS CRITERIA  SIRS Temperature  0  SIRS Pulse 1  SIRS Respirations  1  SIRS WBC 1  SIRS Score Sum  3

## 2023-01-13 NOTE — ED Notes (Signed)
EDP informed of patient desaturating while resting on 4LNC (baseline oxygen). Patient with hx of OSA and OHS, wears BiPap QHS at home. Per EDP, no Bipap at this time d/t patient's mental status.

## 2023-01-14 ENCOUNTER — Inpatient Hospital Stay (HOSPITAL_COMMUNITY): Payer: Medicare Other

## 2023-01-14 DIAGNOSIS — N390 Urinary tract infection, site not specified: Secondary | ICD-10-CM | POA: Diagnosis not present

## 2023-01-14 LAB — BASIC METABOLIC PANEL
Anion gap: 8 (ref 5–15)
BUN: 25 mg/dL — ABNORMAL HIGH (ref 8–23)
CO2: 26 mmol/L (ref 22–32)
Calcium: 8.7 mg/dL — ABNORMAL LOW (ref 8.9–10.3)
Chloride: 107 mmol/L (ref 98–111)
Creatinine, Ser: 1.97 mg/dL — ABNORMAL HIGH (ref 0.61–1.24)
GFR, Estimated: 34 mL/min — ABNORMAL LOW (ref >60)
Glucose, Bld: 99 mg/dL (ref 70–99)
Potassium: 3.8 mmol/L (ref 3.5–5.1)
Sodium: 141 mmol/L (ref 135–145)

## 2023-01-14 LAB — CULTURE, BLOOD (ROUTINE X 2)
Culture: NO GROWTH
Special Requests: ADEQUATE

## 2023-01-14 LAB — CBC
HCT: 33.8 % — ABNORMAL LOW (ref 39.0–52.0)
Hemoglobin: 10.7 g/dL — ABNORMAL LOW (ref 13.0–17.0)
MCH: 29.5 pg (ref 26.0–34.0)
MCHC: 31.7 g/dL (ref 30.0–36.0)
MCV: 93.1 fL (ref 80.0–100.0)
Platelets: 208 10*3/uL (ref 150–400)
RBC: 3.63 MIL/uL — ABNORMAL LOW (ref 4.22–5.81)
RDW: 17.2 % — ABNORMAL HIGH (ref 11.5–15.5)
WBC: 23.1 10*3/uL — ABNORMAL HIGH (ref 4.0–10.5)
nRBC: 0 % (ref 0.0–0.2)

## 2023-01-14 LAB — GLUCOSE, CAPILLARY
Glucose-Capillary: 89 mg/dL (ref 70–99)
Glucose-Capillary: 116 mg/dL — ABNORMAL HIGH (ref 70–99)
Glucose-Capillary: 107 mg/dL — ABNORMAL HIGH (ref 70–99)
Glucose-Capillary: 106 mg/dL — ABNORMAL HIGH (ref 70–99)

## 2023-01-14 LAB — PHOSPHORUS: Phosphorus: 4.2 mg/dL (ref 2.5–4.6)

## 2023-01-14 LAB — URINE CULTURE: Culture: 20000 — AB

## 2023-01-14 LAB — MAGNESIUM: Magnesium: 1.6 mg/dL — ABNORMAL LOW (ref 1.7–2.4)

## 2023-01-14 MED ORDER — METOPROLOL TARTRATE 12.5 MG HALF TABLET: 12.5000 mg | ORAL_TABLET | Freq: Two times a day (BID) | ORAL | Status: DC

## 2023-01-14 MED ORDER — METOPROLOL TARTRATE 25 MG PO TABS
25.0000 mg | ORAL_TABLET | Freq: Two times a day (BID) | ORAL | Status: DC
  Administered 2023-01-14 – 2023-01-19 (×11): 25 mg via ORAL
  Filled 2023-01-14 (×11): qty 1

## 2023-01-14 MED ORDER — MAGNESIUM SULFATE 2 GM/50ML IV SOLN
2.0000 g | Freq: Once | INTRAVENOUS | Status: AC
  Administered 2023-01-14: 2 g via INTRAVENOUS
  Filled 2023-01-14: qty 50

## 2023-01-14 MED ORDER — CHLORHEXIDINE GLUCONATE CLOTH 2 % EX PADS
6.0000 | MEDICATED_PAD | Freq: Every day | CUTANEOUS | Status: DC
  Administered 2023-01-14 – 2023-01-19 (×6): 6 via TOPICAL

## 2023-01-14 MED ORDER — ACETAMINOPHEN 500 MG PO TABS
1000.0000 mg | ORAL_TABLET | Freq: Once | ORAL | Status: AC
  Administered 2023-01-14: 1000 mg via ORAL
  Filled 2023-01-14: qty 2

## 2023-01-14 MED ORDER — SODIUM CHLORIDE 0.9 % IV SOLN: INTRAVENOUS | Status: DC

## 2023-01-14 NOTE — Evaluation (Signed)
Physical Therapy Evaluation Patient Details Name: [name] [name] MRN: [id number] DOB: [birth year] Today's Date: 01/14/2023  History of Present Illness  Pt is 77 year old presented to [facility] on  01/12/23 for weakness and fever. Pt with severe sepsis due to UTI.  PMH - T12-L1 fx with ORIF 11/23/22, morbid obesity, OSA, OHS, DM2, HTN, HLD, COPD, severe pulmonary hypertension, chronic diastolic and right-sided CHF, chronic 3 to 5 L oxygen requirement, permanent A-fib on Eliquis, migraine, arthritis, gout  Clinical Impression  PT eval limited to bed mobility due to pt has to have clam shell brace on due to recent back fracture and the back half of the brace wasn't in room. Called pt's daughter later and the back half of the brace got left at [facility] ED when pt was transferred to [facility]. Daughter is going to pick up the brace from [facility] and bring it here on Saturday. Do not feel I can make DC recommendation until we are able to assess his mobility once brace is on. Pt had been home from SNF ~1 week prior to this admission. Daughter was assisting him with short household ambulation and pt using w/c primarily.      Recommendations for follow up therapy are one component of a multi-disciplinary discharge planning process, led by the attending physician.  Recommendations may be updated based on patient status, additional functional criteria and insurance authorization.  Follow Up Recommendations       Assistance Recommended at Discharge Frequent or constant Supervision/Assistance  Patient can return home with the following  A lot of help with walking and/or transfers;A lot of help with bathing/dressing/bathroom;Assist for transportation    Equipment Recommendations None recommended by PT  Recommendations for Other Services       Functional Status Assessment Patient has had a recent decline in their functional status and demonstrates the ability to make significant improvements in function in a reasonable and  predictable amount of time.     Precautions / Restrictions Precautions Precautions: Fall;Back Required Braces or Orthoses: Spinal Brace Spinal Brace: Applied in supine position      Mobility  Bed Mobility Overal bed mobility: Needs Assistance Bed Mobility: Rolling Rolling: Mod assist, +2 for safety/equipment         General bed mobility comments: Assist to bring hips and shoulders over    Transfers                   General transfer comment: Unable to assess due to missing back brace    Ambulation/Gait               General Gait Details: Unable to assess due to missing back brace  Stairs            Wheelchair Mobility    Modified Rankin (Stroke Patients Only)       Balance                                             Pertinent Vitals/Pain Pain Assessment Pain Assessment: Faces Faces Pain Scale: Hurts even more Pain Location: back, buttocks, and groin Pain Descriptors / Indicators: Grimacing, Guarding, Moaning Pain Intervention(s): Limited activity within patient's tolerance, Monitored during session, Repositioned    Home Living Family/patient expects to be discharged to:: Private residence Living Arrangements: Children Available Help at Discharge: Family;Available 24 hours/day Type of Home: House Home Access: Stairs  to enter Entrance Stairs-Rails: Right;Left;Can reach both Entrance Stairs-Number of Steps: 6   Home Layout: One level Home Equipment: Agricultural consultant (2 wheels);BSC/3in1;Wheelchair - manual;Hospital bed      Prior Function Prior Level of Function : Needs assist       Physical Assist : Mobility (physical);ADLs (physical) Mobility (physical): Bed mobility;Transfers;Gait   Mobility Comments: Min to mod assist for bed mobility and transfers per daughter. Amb short household distances with min assist. Uses w/c  most of the time.       Hand Dominance   Dominant Hand: Left    Extremity/Trunk  Assessment   Upper Extremity Assessment Upper Extremity Assessment: Defer to OT evaluation    Lower Extremity Assessment Lower Extremity Assessment: Generalized weakness       Communication   Communication: No difficulties  Cognition Arousal/Alertness: Awake/alert Behavior During Therapy: Flat affect Overall Cognitive Status: No family/caregiver present to determine baseline cognitive functioning                                 General Comments: Decr memory, slow to process, follows 1 step commands        General Comments      Exercises     Assessment/Plan    PT Assessment Patient needs continued PT services  PT Problem List Decreased strength;Decreased activity tolerance;Decreased balance;Decreased mobility;Pain       PT Treatment Interventions DME instruction;Gait training;Therapeutic activities;Functional mobility training;Therapeutic exercise;Balance training;Patient/family education    PT Goals (Current goals can be found in the Care Plan section)  Acute Rehab PT Goals Patient Stated Goal: Return home PT Goal Formulation: With patient Time For Goal Achievement: 01/28/23 Potential to Achieve Goals: Fair    Frequency Min 1X/week     Co-evaluation               AM-PAC PT "6 Clicks" Mobility  Outcome Measure Help needed turning from your back to your side while in a flat bed without using bedrails?: A Lot Help needed moving from lying on your back to sitting on the side of a flat bed without using bedrails?: Total Help needed moving to and from a bed to a chair (including a wheelchair)?: Total Help needed standing up from a chair using your arms (e.g., wheelchair or bedside chair)?: Total Help needed to walk in hospital room?: Total Help needed climbing 3-5 steps with a railing? : Total 6 Click Score: 7    End of Session Equipment Utilized During Treatment: Oxygen Activity Tolerance: Patient limited by pain Patient left: in bed;with  call bell/phone within reach;with bed alarm set Nurse Communication: Mobility status PT Visit Diagnosis: Other abnormalities of gait and mobility (R26.89);History of falling (Z91.81);Muscle weakness (generalized) (M62.81);Pain Pain - part of body:  (back)    Time: 8295-6213 PT Time Calculation (min) (ACUTE ONLY): 18 min   Charges:   PT Evaluation $PT Eval Moderate Complexity: 1 Mod          [facility] PT Acute Rehabilitation Services Office [phone]   Angelina Ok [facility] 01/14/2023, 5:53 PM

## 2023-01-14 NOTE — Progress Notes (Signed)
   01/13/23 2308  Assess: MEWS Score  Temp 98.1 F (36.7 C)  BP 94/60  MAP (mmHg) 70  Pulse Rate 100  ECG Heart Rate 94  Resp (!) 37  SpO2 97 %  Assess: MEWS Score  MEWS Temp 0  MEWS Systolic 1  MEWS Pulse 0  MEWS RR 3  MEWS LOC 0  MEWS Score 4  MEWS Score Color Red  Assess: if the MEWS score is Yellow or Red  Were vital signs taken at a resting state? Yes  Focused Assessment Change from prior assessment (see assessment flowsheet)  Does the patient meet 2 or more of the SIRS criteria? No  MEWS guidelines implemented  No, other (Comment) (pt gets SOB when he is in pain)  Assess: SIRS CRITERIA  SIRS Temperature  0  SIRS Pulse 1  SIRS Respirations  1  SIRS WBC 0  SIRS Score Sum  2

## 2023-01-14 NOTE — Plan of Care (Signed)
  Problem: Elimination: Goal: Will not experience complications related to bowel motility Outcome: Completed/Met Goal: Will not experience complications related to urinary retention Outcome: Completed/Met   

## 2023-01-14 NOTE — TOC Initial Note (Addendum)
Transition of Care [facility]) - Initial/Assessment Note    Patient Details  Name: [name] [name] MRN: [id number] Date of Birth: 08/29/1945  Transition of Care [facility]) CM/SW Contact:    Leone Haven, RN Phone Number: 01/14/2023, 1:48 PM  Clinical Narrative:                 From home with daughter, [name]. , his prior level of function was walking short distance w/walker, uses w/chair mostly.   He is [facility] patient also.  PCP is Oliver Barre, insurance on file and has medication coverage.  He is active with [facility] for RN, PT, OT, ST.  He has walker, w/chair, hospital bed, 3 n 1, oxygen 4 liters [employer], support system is Kara Dies, (daughter).  [name] will transport him home at dc.  Daughter states they plan to continue with [facility] at dc.   Patient has been to [facility] in the past, but now at home. NCM confirmed with Morrie Sheldon regarding active status with [facility] services, she confirmed.    Expected Discharge Plan: Home w Home Health Services Barriers to Discharge: Continued Medical Work up   Patient Goals and CMS Choice Patient states their goals for this hospitalization and ongoing recovery are:: return home CMS Medicare.gov Compare Post Acute Care list provided to:: Patient Represenative (must comment) Choice offered to / list presented to : Adult Children      Expected Discharge Plan and Services In-house Referral: NA Discharge Planning Services: CM Consult Post Acute Care Choice: Home Health Living arrangements for the past 2 months: Single Family Home                 DME Arranged: N/A DME Agency: NA       HH Arranged: RN, PT, OT, Speech Therapy HH Agency: [facility] ([facility]) Date HH Agency Contacted: 01/14/23 Time HH Agency Contacted: 1345 Representative spoke with at [facility] Agency: Morrie Sheldon  Prior Living Arrangements/Services Living arrangements for the past 2 months: Single Family Home Lives with:: Adult Children Patient language and need for interpreter  reviewed:: Yes Do you feel safe going back to the place where you live?: Yes      Need for Family Participation in Patient Care: Yes (Comment) Care giver support system in place?: Yes (comment) Current home services: DME, Home PT, Home RN, Home OT, Other (comment) (hospital bed, walker, w/chair, 3 n 1, oxygen 4 liters [employer], active with [facility] for [facility]) Criminal Activity/Legal Involvement Pertinent to Current Situation/Hospitalization: No - Comment as needed  Activities of Daily Living      Permission Sought/Granted                  Emotional Assessment Appearance:: Appears stated age Attitude/Demeanor/Rapport: Engaged Affect (typically observed): Appropriate Orientation: : Oriented to Self, Oriented to Place, Oriented to  Time, Oriented to Situation Alcohol / Substance Use: Not Applicable Psych Involvement: No (comment)  Admission diagnosis:  Lower urinary tract obstruction [N13.9] Complicated UTI (urinary tract infection) [N39.0] Acute pyelonephritis [N10] Severe sepsis with acute organ dysfunction (HCC) [A41.9, R65.20] Sepsis (HCC) [A41.9] Patient Active Problem List   Diagnosis Date Noted   Complicated UTI (urinary tract infection) 01/13/2023   Sepsis (HCC) 01/13/2023   Closed fracture of eleventh thoracic vertebra (HCC) 11/23/2022   Closed L2 vertebral fracture (HCC) 11/18/2022   Chronic a-fib (HCC) 11/18/2022   L1 vertebral fracture (HCC) 11/18/2022   Subacute cough 10/10/2022   Fatigue 10/08/2022   Cellulitis of right leg 10/08/2022   Chronic  respiratory failure with hypoxia and hypercapnia related to obesity hypoventilation syndrome 03/19/2022   Chronic diastolic CHF (congestive heart failure) (HCC) 03/19/2022   Actinic keratosis 11/27/2021   Sebaceous cyst 11/27/2021   Urinary frequency 10/17/2021   Left arm swelling 01/18/2021   Vitamin D deficiency 10/10/2020   B12 deficiency 10/10/2020   Lower extremity weakness 09/12/2020   Lesion of right ear  04/13/2020   Arthritis of right acromioclavicular joint 04/02/2020   Right rotator cuff tear 04/02/2020   Greater trochanteric bursitis, right 08/14/2019   Tick bite, infected 12/19/2018   Degenerative arthritis of right knee 02/21/2017   Right knee pain 02/15/2017   Volume overload 12/23/2015   OSA treated with BiPAP 12/23/2015   Pulmonary hypertension (HCC) 12/23/2015   Gout 12/23/2015   Cellulitis of leg, right 02/14/2015   Allergic rhinitis, cause unspecified 11/21/2013   Chronic cor pulmonale (HCC) 12/22/2012   DOE (dyspnea on exertion) 12/20/2012   Left knee pain 07/11/2012   Increased prostate specific antigen (PSA) velocity 01/06/2012   Encounter for well adult exam with abnormal findings 12/25/2010   Long term (current) use of anticoagulants 11/06/2010   HEMATOCHEZIA 06/26/2010   BRONCHITIS, CHRONIC 01/02/2009   Obesity hypoventilation syndrome (HCC) 11/26/2008   Permanent atrial fibrillation 10/16/2008   PERIPHERAL EDEMA 01/05/2008   Non-insulin dependent type 2 diabetes mellitus (HCC) 06/09/2007   COMMON MIGRAINE 06/09/2007   Unspecified hearing loss 06/09/2007   PROSTATE SPECIFIC ANTIGEN, ELEVATED 06/09/2007   Hyperlipidemia 02/18/2007   Severe obesity (BMI >= 40) (HCC) 02/18/2007   Essential hypertension 02/18/2007   PCP:  Corwin Levins, MD Pharmacy:   [facility] [phone] - [place], [place] - [address] [facility] [address] [place] [address] Phone: [phone] Fax: [phone]  [facility] - [place], [place] - [address] [place] [address] Phone: [phone] Fax: [phone]  [facility] [phone] - [place], [place] - [address] [place] [address] Phone: [phone] Fax: [phone]     Social Determinants of Health (SDOH) Social History: SDOH Screenings   Food Insecurity: No Food  Insecurity (11/25/2022)  Housing: Low Risk  (11/25/2022)  Transportation Needs: No Transportation Needs (11/25/2022)  Utilities: Not At Risk (11/25/2022)  Depression (PHQ2-9): Low Risk  (10/08/2022)  Tobacco Use: Medium Risk (01/12/2023)   SDOH Interventions:     Readmission Risk Interventions    01/14/2023    1:35 PM  Readmission Risk Prevention Plan  Transportation Screening Complete  Medication Review (RN Care Manager) Complete  PCP or Specialist appointment within 3-5 days of discharge Complete  HRI or Home Care Consult Complete  Palliative Care Screening Not Applicable  Skilled Nursing Facility Not Applicable

## 2023-01-14 NOTE — Progress Notes (Signed)
   01/13/23 1932  Assess: MEWS Score  Temp 98.9 F (37.2 C)  BP (!) 121/53  MAP (mmHg) 71  Pulse Rate (!) 119 (chronic Afib)  ECG Heart Rate (!) 122  Resp 19  SpO2 94 %  O2 Device Nasal Cannula  O2 Flow Rate (L/min) 3 L/min  Assess: MEWS Score  MEWS Temp 0  MEWS Systolic 0  MEWS Pulse 2  MEWS RR 0  MEWS LOC 0  MEWS Score 2  MEWS Score Color Yellow  Assess: if the MEWS score is Yellow or Red  Were vital signs taken at a resting state? No  Focused Assessment Change from prior assessment (see assessment flowsheet)  Does the patient meet 2 or more of the SIRS criteria? No  MEWS guidelines implemented  No, other (Comment) (HR goes into the 120's at times, chronic Afib)  Assess: SIRS CRITERIA  SIRS Temperature  0  SIRS Pulse 1  SIRS Respirations  0  SIRS WBC 0  SIRS Score Sum  1

## 2023-01-14 NOTE — Progress Notes (Addendum)
PROGRESS NOTE  [name] [name]  ZOX:[id number] DOB: Dec 01, 1945 DOA: 01/12/2023 PCP: Corwin Levins, MD   Brief Narrative: Patient is a 77 year old male with history of paroxysmal A-fib on Eliquis, chronic diastolic CHF, COPD, chronic hypoxic respiratory failure on 4 L of oxygen at home, diabetes type 2 who presented from home with complaint of fever, chills, dysuria, lower abdominal pain.  He was just discharged from rehab to home.He lives with his daughter.On presentation, he was febrile, rapid A-fib, blood pressure was low.  UA was suspicious for UTI.  CT abdomen /pelvis showed left kidney stranding, mild hydronephrosis without obstruction.  Lab work showed elevated leukocytes.  Patient was admitted for the management of severe sepsis secondary to UTI.  Started on broad spectrum antibiotics.  Blood cultures showing Klebsiella.  Assessment & Plan:  Principal Problem:   Complicated UTI (urinary tract infection) Active Problems:   Chronic diastolic CHF (congestive heart failure) (HCC)   Chronic a-fib (HCC)   Sepsis (HCC)   Severe sepsis secondary to UTI/Pyelo//bacteremia: Presented with fever, leukocytosis, tachycardia, AKI.  Most likely source is urinary tract infection.  Urine culture/blood cultures showing gram-negative rods/Klebsiella.  Follow-up final culture report. CT abdomen/pelvis showed left kidney stranding.  Afebrile this morning.  Continue to monitor white cell count.  AKI on CkD stage 3a/acute urinary retention: Started on IV fluids.  His last creatinine was 1.2 as per 4/26.  CT abdomen/pelvis did not show obstruction.  Foley was placed in the emergency department.  Continue to monitor kidney function.  Continue gentle IV fluids.  Will give voiding trial when appropriate  A-fib with RVR: Most likely triggered by sepsis physiology.  Takes Coreg, Eliquis at home.  Was in rapid A-fib on presentation, hypotensive.  Home Coreg stopped, started on metoprolol.  Also given a dose of IV digoxin.   Continue Eliquis.  Heart rate better this morning.  Hopefully it will settle down with resolution of sepsis  Hypokalemia/hypophosphatemia/Hypomagnesemia: currently being monitored and supplemented as needed  Type 2 diabetes: Presented with hypoglycemia.  Currently only on sliding scale.  Continue monitor blood sugars.  Chronic hypoxic respiratory failure/COPD: No signs of active exacerbation.  Continue bronchodilators as needed.  On 4 L of oxygen at home, currently on same.  Chronic diastolic CHF: Presented with AKI, likely dehydrated.  Home torsemide on hold. started on IV fluids.  Gout: Continue allopurinol  Deconditioning/generalized weakness:  Patient was just discharged from rehab to home.  We consulted PT/OT.  Currently living with daughter.       Pressure Injury 12/05/22 Buttocks Right Stage 2 -  Partial thickness loss of dermis presenting as a shallow open injury with a red, pink wound bed without slough. (Active)  12/05/22 0800  Location: Buttocks  Location Orientation: Right  Staging: Stage 2 -  Partial thickness loss of dermis presenting as a shallow open injury with a red, pink wound bed without slough.  Wound Description (Comments):   Present on Admission: No  Dressing Type Foam - Lift dressing to assess site every shift 01/13/23 1940    DVT prophylaxis: apixaban (ELIQUIS) tablet 5 mg     Code Status: Full Code  Family Communication: Discussed with daughter at bedside  Patient status:Inpatient  Patient is from :Home  Anticipated discharge WJ:XBJY  Estimated DC date:   Consultants: None  Procedures:None  Antimicrobials:  Anti-infectives (From admission, onward)    Start     Dose/Rate Route Frequency Ordered Stop   01/13/23 2145  vancomycin (VANCOREADY) IVPB 750 mg/150  mL  Status:  Discontinued        750 mg 150 mL/hr over 60 Minutes Intravenous Every 24 hours 01/12/23 2145 01/13/23 0927   01/13/23 1100  cefTRIAXone (ROCEPHIN) 2 g in sodium chloride  0.9 % 100 mL IVPB        2 g 200 mL/hr over 30 Minutes Intravenous Every 24 hours 01/13/23 0927     01/13/23 1015  ceFEPIme (MAXIPIME) 2 g in sodium chloride 0.9 % 100 mL IVPB  Status:  Discontinued        2 g 200 mL/hr over 30 Minutes Intravenous Every 12 hours 01/12/23 2209 01/13/23 0927   01/12/23 2145  vancomycin (VANCOCIN) IVPB 1000 mg/200 mL premix       See Hyperspace for full Linked Orders Report.   1,000 mg 200 mL/hr over 60 Minutes Intravenous  Once 01/12/23 2143 01/13/23 0100   01/12/23 2145  vancomycin (VANCOCIN) IVPB 1000 mg/200 mL premix       See Hyperspace for full Linked Orders Report.   1,000 mg 200 mL/hr over 60 Minutes Intravenous  Once 01/12/23 2143 01/13/23 0100   01/12/23 2045  ceFEPIme (MAXIPIME) 2 g in sodium chloride 0.9 % 100 mL IVPB        2 g 200 mL/hr over 30 Minutes Intravenous  Once 01/12/23 2039 01/12/23 2129   01/12/23 2045  metroNIDAZOLE (FLAGYL) IVPB 500 mg        500 mg 100 mL/hr over 60 Minutes Intravenous  Once 01/12/23 2039 01/12/23 2233   01/12/23 2045  vancomycin (VANCOCIN) IVPB 1000 mg/200 mL premix  Status:  Discontinued        1,000 mg 200 mL/hr over 60 Minutes Intravenous  Once 01/12/23 2039 01/12/23 2143       Subjective: Patient seen and examined the bedside today.  Heart rate about in the range of 90-110.  Blood pressure stable this morning.  He complains of weakness, not feeling good.  Afebrile this morning.  Long discussion had at the bedside about current management plan  Objective: Vitals:   01/13/23 2308 01/13/23 2315 01/14/23 0000 01/14/23 0358  BP: 94/60  95/61 97/62  Pulse: 100  91   Resp: (!) 37 18  20  Temp: 98.1 F (36.7 C)   98.5 F (36.9 C)  TempSrc: Oral   Oral  SpO2: 97%   100%  Weight:    114.6 kg  Height:        Intake/Output Summary (Last 24 hours) at 01/14/2023 0739 Last data filed at 01/14/2023 0331 Gross per 24 hour  Intake 170 ml  Output 2351 ml  Net -2181 ml   Filed Weights   01/12/23 2043  01/13/23 0328 01/14/23 0358  Weight: 115.8 kg 113 kg 114.6 kg    Examination:  General exam: Weak, deconditioned , obese HEENT: PERRL Respiratory system:  no wheezes or crackles  Cardiovascular system: Irregularly irregular rhythm Gastrointestinal system: Abdomen is nondistended, soft and nontender. Central nervous system: Alert and oriented Extremities: No edema, no clubbing ,no cyanosis Skin: No rashes, no ulcers,no icterus   GU: Foley   Data Reviewed: I have personally reviewed following labs and imaging studies  CBC: Recent Labs  Lab 01/12/23 2034 01/12/23 2128  WBC 18.9*  --   NEUTROABS 15.9*  --   HGB 12.9* 11.2*  HCT 40.0 33.0*  MCV 89.5  --   PLT 314  --    Basic Metabolic Panel: Recent Labs  Lab 01/12/23 2034 01/12/23 2128 01/13/23 1256  NA 140 140  --   K 3.5 3.0*  --   CL 100  --   --   CO2 28  --   --   GLUCOSE 105*  --   --   BUN 37*  --   --   CREATININE 1.98*  --   --   CALCIUM 9.7  --   --   MG  --   --  1.4*  PHOS  --   --  1.7*     Recent Results (from the past 240 hour(s))  Culture, blood (Routine x 2)     Status: None (Preliminary result)   Collection Time: 01/12/23  8:24 PM   Specimen: BLOOD LEFT ARM  Result Value Ref Range Status   Specimen Description   Final    BLOOD LEFT ARM Performed at [facility], [address], [place], [place] [address]    Special Requests   Final    BOTTLES DRAWN AEROBIC AND ANAEROBIC Blood Culture adequate volume Performed at [facility], [address] [place], [place] [address]    Culture  Setup Time   Final    GRAM NEGATIVE RODS AEROBIC BOTTLE ONLY CRITICAL RESULT CALLED TO, READ BACK BY AND VERIFIED WITH: Vivien Rossetti [id number] @1348  BY SM Performed at [facility], [address], [place], [place] [address]    Culture GRAM NEGATIVE RODS  Final   Report Status PENDING  Incomplete  Blood Culture ID Panel (Reflexed)     Status: Abnormal   Collection Time:  01/12/23  8:24 PM  Result Value Ref Range Status   Enterococcus faecalis NOT DETECTED NOT DETECTED Final   Enterococcus Faecium NOT DETECTED NOT DETECTED Final   Listeria monocytogenes NOT DETECTED NOT DETECTED Final   Staphylococcus species NOT DETECTED NOT DETECTED Final   Staphylococcus aureus (BCID) NOT DETECTED NOT DETECTED Final   Staphylococcus epidermidis NOT DETECTED NOT DETECTED Final   Staphylococcus lugdunensis NOT DETECTED NOT DETECTED Final   Streptococcus species NOT DETECTED NOT DETECTED Final   Streptococcus agalactiae NOT DETECTED NOT DETECTED Final   Streptococcus pneumoniae NOT DETECTED NOT DETECTED Final   Streptococcus pyogenes NOT DETECTED NOT DETECTED Final   A.calcoaceticus-baumannii NOT DETECTED NOT DETECTED Final   Bacteroides fragilis NOT DETECTED NOT DETECTED Final   Enterobacterales DETECTED (A) NOT DETECTED Final    Comment: Enterobacterales represent a large order of gram negative bacteria, not a single organism. CRITICAL RESULT CALLED TO, READ BACK BY AND VERIFIED WITH: PHARMD ELIZABETH [id number] @1348  BY SM    Enterobacter cloacae complex NOT DETECTED NOT DETECTED Final   Escherichia coli NOT DETECTED NOT DETECTED Final   Klebsiella aerogenes NOT DETECTED NOT DETECTED Final   Klebsiella oxytoca NOT DETECTED NOT DETECTED Final   Klebsiella pneumoniae DETECTED (A) NOT DETECTED Final    Comment: CRITICAL RESULT CALLED TO, READ BACK BY AND VERIFIED WITH: PHARMD ELIZABETH [id number] @1348  BY SM    Proteus species NOT DETECTED NOT DETECTED Final   Salmonella species NOT DETECTED NOT DETECTED Final   Serratia marcescens NOT DETECTED NOT DETECTED Final   Haemophilus influenzae NOT DETECTED NOT DETECTED Final   Neisseria meningitidis NOT DETECTED NOT DETECTED Final   Pseudomonas aeruginosa NOT DETECTED NOT DETECTED Final   Stenotrophomonas maltophilia NOT DETECTED NOT DETECTED Final   Candida albicans NOT DETECTED NOT DETECTED Final   Candida auris NOT DETECTED  NOT DETECTED Final   Candida glabrata NOT DETECTED NOT DETECTED Final   Candida krusei  NOT DETECTED NOT DETECTED Final   Candida parapsilosis NOT DETECTED NOT DETECTED Final   Candida tropicalis NOT DETECTED NOT DETECTED Final   Cryptococcus neoformans/gattii NOT DETECTED NOT DETECTED Final   CTX-M ESBL NOT DETECTED NOT DETECTED Final   Carbapenem resistance IMP NOT DETECTED NOT DETECTED Final   Carbapenem resistance KPC NOT DETECTED NOT DETECTED Final   Carbapenem resistance NDM NOT DETECTED NOT DETECTED Final   Carbapenem resist OXA 48 LIKE NOT DETECTED NOT DETECTED Final   Carbapenem resistance VIM NOT DETECTED NOT DETECTED Final    Comment: Performed at [facility], [address], [place], [place] [address]  Culture, blood (Routine x 2)     Status: None (Preliminary result)   Collection Time: 01/12/23  8:26 PM   Specimen: BLOOD RIGHT FOREARM  Result Value Ref Range Status   Specimen Description   Final    BLOOD RIGHT FOREARM Performed at [facility], [address] [place], [place] [address]    Special Requests   Final    BOTTLES DRAWN AEROBIC AND ANAEROBIC Blood Culture results may not be optimal due to an excessive volume of blood received in culture bottles Performed at [facility], [address] [place], [place] [address]    Culture  Setup Time   Final    GRAM NEGATIVE RODS IN BOTH AEROBIC AND ANAEROBIC BOTTLES CRITICAL VALUE NOTED.  VALUE IS CONSISTENT WITH PREVIOUSLY REPORTED AND CALLED VALUE. Performed at [facility], [address] [place], [place] [address]    Culture GRAM NEGATIVE RODS  Final   Report Status PENDING  Incomplete  Resp panel by RT-PCR (RSV, Flu A&B, Covid) Anterior Nasal Swab     Status: None   Collection Time: 01/12/23  8:39 PM   Specimen: Anterior Nasal Swab  Result Value Ref Range Status   SARS Coronavirus 2 by RT PCR NEGATIVE NEGATIVE Final    Comment: (NOTE) SARS-CoV-2 target nucleic acids are NOT  DETECTED.  The SARS-CoV-2 RNA is generally detectable in upper respiratory specimens during the acute phase of infection. The lowest concentration of SARS-CoV-2 viral copies this assay can detect is 138 copies/mL. A negative result does not preclude SARS-Cov-2 infection and should not be used as the sole basis for treatment or other patient management decisions. A negative result may occur with  improper specimen collection/handling, submission of specimen other than nasopharyngeal swab, presence of viral mutation(s) within the areas targeted by this assay, and inadequate number of viral copies(<138 copies/mL). A negative result must be combined with clinical observations, patient history, and epidemiological information. The expected result is Negative.  Fact Sheet for Patients:  BloggerCourse.com  Fact Sheet for Healthcare Providers:  SeriousBroker.it  This test is no t yet approved or cleared by the [place] FDA and  has been authorized for detection and/or diagnosis of SARS-CoV-2 by FDA under an Emergency Use Authorization (EUA). This EUA will remain  in effect (meaning this test can be used) for the duration of the COVID-19 declaration under Section 564(b)(1) of the Act, 21 U.S.C.section 360bbb-3(b)(1), unless the authorization is terminated  or revoked sooner.       Influenza A by PCR NEGATIVE NEGATIVE Final   Influenza B by PCR NEGATIVE NEGATIVE Final    Comment: (NOTE) The Xpert Xpress SARS-CoV-2/FLU/RSV plus assay is intended as an aid in the diagnosis of influenza from Nasopharyngeal swab specimens and should not be used as a sole basis for treatment. Nasal washings and aspirates  are unacceptable for Xpert Xpress SARS-CoV-2/FLU/RSV testing.  Fact Sheet for Patients: BloggerCourse.com  Fact Sheet for Healthcare Providers: SeriousBroker.it  This test is not yet  approved or cleared by the [place] FDA and has been authorized for detection and/or diagnosis of SARS-CoV-2 by FDA under an Emergency Use Authorization (EUA). This EUA will remain in effect (meaning this test can be used) for the duration of the COVID-19 declaration under Section 564(b)(1) of the Act, 21 U.S.C. section 360bbb-3(b)(1), unless the authorization is terminated or revoked.     Resp Syncytial Virus by PCR NEGATIVE NEGATIVE Final    Comment: (NOTE) Fact Sheet for Patients: BloggerCourse.com  Fact Sheet for Healthcare Providers: SeriousBroker.it  This test is not yet approved or cleared by the [place] FDA and has been authorized for detection and/or diagnosis of SARS-CoV-2 by FDA under an Emergency Use Authorization (EUA). This EUA will remain in effect (meaning this test can be used) for the duration of the COVID-19 declaration under Section 564(b)(1) of the Act, 21 U.S.C. section 360bbb-3(b)(1), unless the authorization is terminated or revoked.  Performed at [facility], [address] [place], [place] [address]      Radiology Studies: CT CHEST ABDOMEN PELVIS W CONTRAST  Result Date: 01/12/2023 CLINICAL DATA:  Sepsis, fever and chills. EXAM: CT CHEST, ABDOMEN, AND PELVIS WITH CONTRAST TECHNIQUE: Multidetector CT imaging of the chest, abdomen and pelvis was performed following the standard protocol during bolus administration of intravenous contrast. RADIATION DOSE REDUCTION: This exam was performed according to the departmental dose-optimization program which includes automated exposure control, adjustment of the mA and/or kV according to patient size and/or use of iterative reconstruction technique. CONTRAST:  70mL OMNIPAQUE IOHEXOL 300 MG/ML  SOLN COMPARISON:  None Available. FINDINGS: CT CHEST FINDINGS Cardiovascular: The heart is enlarged and there is no pericardial effusion. A few  scattered coronary artery calcifications are noted. There is atherosclerotic calcification of the aorta without evidence of aneurysm. The pulmonary trunk is normal in caliber. Mediastinum/Nodes: No mediastinal or axillary lymphadenopathy. A prominent lymph node is present at the right hilum measuring 1.2 cm. The thyroid gland, trachea, and esophagus are within normal limits. Lungs/Pleura: Bronchial wall thickening is noted in the lower lobes bilaterally with patchy atelectasis or infiltrate. No effusion or pneumothorax. Musculoskeletal: Degenerative changes are present in the thoracic spine. No acute fracture. Spinal fusion hardware is noted in the thoracolumbar spine. CT ABDOMEN PELVIS FINDINGS Hepatobiliary: Subcentimeter hypodensities are present in the liver, most likely representing cysts or hemangiomas. No biliary ductal dilatation. The gallbladder is without stones. Pancreas: Unremarkable. No pancreatic ductal dilatation or surrounding inflammatory changes. Spleen: Normal in size without focal abnormality. Adrenals/Urinary Tract: Fat attenuation nodules are present in the left adrenal gland measuring up to 1.3 cm, compatible with myelolipomas. There is a 1.8 cm nodule in the left adrenal gland measuring 1.83 cm with attenuation of 8 Hounsfield units, likely adenoma. The right adrenal gland is within normal limits. The kidneys enhance symmetrically. Multiple cysts are noted in the left kidney. No renal calculus bilaterally. No obstructive uropathy on the right. There is mild hydroureteronephrosis on the left with perinephric and periureteral fat stranding. No significant bladder wall thickening. There is diffuse perivesicular fat stranding. Stomach/Bowel: Stomach is within normal limits. Appendix is not seen. No evidence of bowel wall thickening, distention, or inflammatory changes. No free air or pneumatosis. A few scattered diverticula are present along the colon without evidence of diverticulitis.  Vascular/Lymphatic: Aortic atherosclerosis. Prominent lymph nodes are present in  the retroperitoneum in the periaortic space and in the perivesicular region which may be reactive. Reproductive: The prostate gland is mildly enlarged. Other: No abdominopelvic ascites. Musculoskeletal: Degenerative changes are present in the lumbar spine and spinal fusion hardware is noted. A cystic structure is present in the subcutaneous tissues over the gluteal region on the left posteriorly, possible large sebaceous cyst. IMPRESSION: 1. Mild hydroureteronephrosis on the left with no obstructing stone. There is perinephric and periureteral fat stranding on the left with perivesicular fat stranding about the bladder and local lymphadenopathy, which may be infectious or inflammatory. 2. Bronchial wall thickening in the lower lobes bilaterally with mild atelectasis or infiltrate. 3. Left adrenal nodules, compatible with adenoma and myelolipomas. 4. Coronary artery calcifications. 5. Aortic atherosclerosis. Electronically Signed   By: Thornell Sartorius M.D.   On: 01/12/2023 22:29   CT Head Wo Contrast  Result Date: 01/12/2023 CLINICAL DATA:  Fever and chills.  Mental status change EXAM: CT HEAD WITHOUT CONTRAST TECHNIQUE: Contiguous axial images were obtained from the base of the skull through the vertex without intravenous contrast. RADIATION DOSE REDUCTION: This exam was performed according to the departmental dose-optimization program which includes automated exposure control, adjustment of the mA and/or kV according to patient size and/or use of iterative reconstruction technique. COMPARISON:  CT head 11/18/2022 FINDINGS: Brain: No intracranial hemorrhage, mass effect, or evidence of acute infarct. No hydrocephalus. No extra-axial fluid collection. Generalized cerebral atrophy. Ill-defined hypoattenuation within the cerebral white matter is nonspecific but consistent with chronic small vessel ischemic disease. Vascular: No hyperdense  vessel. Intracranial arterial calcification. Skull: No fracture or focal lesion. Sinuses/Orbits: No acute finding. Paranasal sinuses and mastoid air cells are well aerated. Other: None. IMPRESSION: 1. No evidence of acute intracranial abnormality. Electronically Signed   By: Minerva Fester M.D.   On: 01/12/2023 22:20   DG Chest Port 1 View  Result Date: 01/12/2023 CLINICAL DATA:  Shortness of breath EXAM: PORTABLE CHEST 1 VIEW COMPARISON:  Chest radiograph dated 11/22/2022 FINDINGS: Low lung volumes. Bibasilar patchy opacities. Small bilateral pleural effusions. No pneumothorax. Similar enlarged cardiomediastinal silhouette. Partially imaged lumbar spinal fixation hardware appears intact. IMPRESSION: 1. Low lung volumes with bibasilar patchy opacities, may represent atelectasis, aspiration, or pneumonia. 2. Small bilateral pleural effusions. 3. Similar cardiomegaly. Electronically Signed   By: Agustin Cree M.D.   On: 01/12/2023 21:06    Scheduled Meds:  allopurinol  100 mg Oral Daily   apixaban  5 mg Oral BID   atorvastatin  20 mg Oral Daily   insulin aspart  0-9 Units Subcutaneous TID WC   metoprolol tartrate  25 mg Oral BID   senna-docusate  1 tablet Oral BID   tamsulosin  0.4 mg Oral QPC supper   Continuous Infusions:  cefTRIAXone (ROCEPHIN)  IV 2 g (01/13/23 1007)     LOS: 1 day   Burnadette Pop, MD [facility] P6/02/2023, 7:39 AM

## 2023-01-14 NOTE — Consult Note (Signed)
   [facility] CM Inpatient Consult   01/14/2023  [name] [name] Dec 20, 1945 [id number]  [facility] [[facility]]  Accountable Care Organization [ACO] Patient:  [name]  Primary Care Provider:  Corwin Levins, MD with [place] at [facility] which is listed to provide the transition of care   Patient screened for hospitalization with noted extreme high risk score for unplanned readmission risk and to assess for potential [facility]  [[facility]] Care Management service needs for post hospital transition for care coordination.  Review of patient's electronic medical record reveals patient is admitted for Sepsis, UTI, recently from SNF but home with home health.  Met with patient and daughter at the bedside to explain reason for rounding visit for post hospital follow up support.  Both patient and daughter, [name] were accepting and generous.  Patient's daughter was given a 24 hour nurse advise line magnet and an appointment follow up reminder card.  Plan:  Update TOC RNCM for post hospital [facility] follow up.  Continue to follow progress and disposition to assess for post hospital community care coordination/management needs.  Referral request for community care coordination: pending disposition  Of note, [facility] Care Management/Population Health does not replace or interfere with any arrangements made by the Inpatient Transition of Care team.  For questions contact:   Charlesetta Shanks, RN BSN CCM [facility]  [phone] business mobile phone Toll free office [phone]  *Concierge Line  [phone] Fax number: [phone] [email or web address]

## 2023-01-15 DIAGNOSIS — R7881 Bacteremia: Secondary | ICD-10-CM | POA: Diagnosis present

## 2023-01-15 DIAGNOSIS — N390 Urinary tract infection, site not specified: Secondary | ICD-10-CM | POA: Diagnosis not present

## 2023-01-15 LAB — CBC
HCT: 33.3 % — ABNORMAL LOW (ref 39.0–52.0)
Hemoglobin: 10.5 g/dL — ABNORMAL LOW (ref 13.0–17.0)
MCH: 29 pg (ref 26.0–34.0)
MCHC: 31.5 g/dL (ref 30.0–36.0)
MCV: 92 fL (ref 80.0–100.0)
Platelets: 218 10*3/uL (ref 150–400)
RBC: 3.62 MIL/uL — ABNORMAL LOW (ref 4.22–5.81)
RDW: 17.1 % — ABNORMAL HIGH (ref 11.5–15.5)
WBC: 20.9 10*3/uL — ABNORMAL HIGH (ref 4.0–10.5)
nRBC: 0 % (ref 0.0–0.2)

## 2023-01-15 LAB — MAGNESIUM: Magnesium: 2 mg/dL (ref 1.7–2.4)

## 2023-01-15 LAB — GLUCOSE, CAPILLARY
Glucose-Capillary: 91 mg/dL (ref 70–99)
Glucose-Capillary: 125 mg/dL — ABNORMAL HIGH (ref 70–99)
Glucose-Capillary: 119 mg/dL — ABNORMAL HIGH (ref 70–99)
Glucose-Capillary: 157 mg/dL — ABNORMAL HIGH (ref 70–99)

## 2023-01-15 LAB — CULTURE, BLOOD (ROUTINE X 2)

## 2023-01-15 LAB — BASIC METABOLIC PANEL
Anion gap: 9 (ref 5–15)
BUN: 25 mg/dL — ABNORMAL HIGH (ref 8–23)
CO2: 26 mmol/L (ref 22–32)
Calcium: 8.8 mg/dL — ABNORMAL LOW (ref 8.9–10.3)
Chloride: 106 mmol/L (ref 98–111)
Creatinine, Ser: 1.83 mg/dL — ABNORMAL HIGH (ref 0.61–1.24)
GFR, Estimated: 38 mL/min — ABNORMAL LOW (ref >60)
Glucose, Bld: 114 mg/dL — ABNORMAL HIGH (ref 70–99)
Potassium: 4 mmol/L (ref 3.5–5.1)
Sodium: 141 mmol/L (ref 135–145)

## 2023-01-15 LAB — URINE CULTURE

## 2023-01-15 MED ORDER — SODIUM CHLORIDE 0.9 % IV SOLN
2.0000 g | INTRAVENOUS | Status: DC
  Administered 2023-01-16: 2 g via INTRAVENOUS
  Filled 2023-01-15: qty 20

## 2023-01-15 MED ORDER — CEFDINIR 300 MG PO CAPS: 300.0000 mg | ORAL_CAPSULE | Freq: Two times a day (BID) | ORAL | Status: DC

## 2023-01-15 NOTE — Evaluation (Signed)
Occupational Therapy Evaluation Patient Details Name: [name] [name] MRN: [id number] DOB: 1946-03-13 Today's Date: 01/15/2023   History of Present Illness Pt is 77 year old presented to [facility] on  01/12/23 for weakness and fever. Pt with severe sepsis due to UTI.  PMH - T12-L1 fx with ORIF 11/23/22, morbid obesity, OSA, OHS, DM2, HTN, HLD, COPD, severe pulmonary hypertension, chronic diastolic and right-sided CHF, chronic 3 to 5 L oxygen requirement, permanent A-fib on Eliquis, migraine, arthritis, gout   Clinical Impression   Pt admitted for above dx, PTA patient lived with daugther who assisted with lower body bathing and dressing, he ambulated using w/c and rollator at baseline min A. Pt currently limited by impaired balance and back pain, pt needing Travonta A +2 to get EOB and is Min A +2 for OOB mobility. Pt daughter hopeful to return pt home if able, discussed with daughter education sessions to promoted safe transition home if able to achieve that level. Pt would benefit from continued acute skilled OT services to address above deficits and help transition to next level of care. Patient would benefit from post acute skilled rehab facility with <3 hours of therapy and 24/7 support until able to progress pt to [facility] level.      Recommendations for follow up therapy are one component of a multi-disciplinary discharge planning process, led by the attending physician.  Recommendations may be updated based on patient status, additional functional criteria and insurance authorization.   Assistance Recommended at Discharge Frequent or constant Supervision/Assistance  Patient can return home with the following A little help with walking and/or transfers;A lot of help with bathing/dressing/bathroom;Assist for transportation;Assistance with cooking/housework;Help with stairs or ramp for entrance    Functional Status Assessment  Patient has had a recent decline in their functional status and demonstrates the ability to  make significant improvements in function in a reasonable and predictable amount of time.  Equipment Recommendations  Tub/shower bench    Recommendations for Other Services       Precautions / Restrictions Precautions Precautions: Fall;Back Required Braces or Orthoses: Spinal Brace Spinal Brace: Applied in standing position (Clam shell brace, pt too large to apply brace in sitting) Restrictions Weight Bearing Restrictions: No      Mobility Bed Mobility Overal bed mobility: Needs Assistance Bed Mobility: Rolling, Sidelying to Sit, Sit to Supine Rolling: Carron assist, +2 for physical assistance Sidelying to sit: Desmond assist   Sit to supine: Kjell assist   General bed mobility comments: Pt rolled L, he reports rolling better to the R. needs assist with trunk and BLE control    Transfers Overall transfer level: Needs assistance Equipment used: Rolling walker (2 wheels) Transfers: Sit to/from Stand Sit to Stand: Min assist, +2 physical assistance                  Balance Overall balance assessment: Needs assistance Sitting-balance support: Feet supported, No upper extremity supported Sitting balance-[name] Scale: Fair     Standing balance support: Bilateral upper extremity supported, During functional activity, Reliant on assistive device for balance Standing balance-[name] Scale: Poor                             ADL either performed or assessed with clinical judgement   ADL Overall ADL's : Needs assistance/impaired Eating/Feeding: Independent;Bed level   Grooming: Sitting;Min guard   Upper Body Bathing: Sitting;Set up;Supervision/ safety   Lower Body Bathing: Sitting/lateral leans;Maximal assistance  Upper Body Dressing : Sitting;Supervision/safety;Set up   Lower Body Dressing: Sitting/lateral leans;Total assistance   Toilet Transfer: Minimal assistance;+2 for physical assistance;Stand-pivot;Rolling walker (2 wheels);BSC/3in1   Toileting- Clothing  Manipulation and Hygiene: Sitting/lateral lean;Maximal assistance Toileting - Clothing Manipulation Details (indicate cue type and reason): Jared A for rear pericare   Tub/Shower Transfer Details (indicate cue type and reason): NT Functional mobility during ADLs: Minimal assistance;+2 for physical assistance       Vision         Perception     Praxis      Pertinent Vitals/Pain Pain Assessment Pain Assessment: Faces Faces Pain Scale: Hurts even more Pain Location: Per pt report, all over Pain Descriptors / Indicators: Grimacing, Guarding, Moaning Pain Intervention(s): Limited activity within patient's tolerance, Monitored during session, Repositioned     Hand Dominance Left   Extremity/Trunk Assessment Upper Extremity Assessment Upper Extremity Assessment: Generalized weakness   Lower Extremity Assessment Lower Extremity Assessment: Generalized weakness       Communication Communication Communication: No difficulties   Cognition Arousal/Alertness: Awake/alert Behavior During Therapy: WFL for tasks assessed/performed Overall Cognitive Status: Within Functional Limits for tasks assessed                                       General Comments  HR between 105-112 seated EOB at rest. HR up to 135-154 upon return to bed from [facility]- furhter mobility deferred for pt safety    Exercises     Shoulder Instructions      Home Living Family/patient expects to be discharged to:: Private residence Living Arrangements: Children Available Help at Discharge: Family;Available 24 hours/day Type of Home: House Home Access: Stairs to enter [employer] of Steps: 6 Entrance Stairs-Rails: Right;Left;Can reach both Home Layout: One level     Bathroom Shower/Tub: Chief Strategy Officer: Standard     Home Equipment: Agricultural consultant (2 wheels);BSC/3in1;Wheelchair - manual;Hospital bed          Prior Functioning/Environment Prior Level of  Function : Needs assist       Physical Assist : Mobility (physical);ADLs (physical) Mobility (physical): Bed mobility;Transfers;Gait   Mobility Comments: Min to mod assist for bed mobility and transfers per daughter. Amb short household distances with min assist. Uses w/c  most of the time. ADLs Comments: Assist with socks and shoes        OT Problem List: Decreased strength;Impaired balance (sitting and/or standing);Decreased activity tolerance;Pain      OT Treatment/Interventions: Self-care/ADL training;Therapeutic activities;Therapeutic exercise;Energy conservation;Visual/perceptual remediation/compensation;DME and/or AE instruction;Balance training;Patient/family education    OT Goals(Current goals can be found in the care plan section) Acute Rehab OT Goals Patient Stated Goal: To return home if possible OT Goal Formulation: With patient/family Time For Goal Achievement: 01/29/23 Potential to Achieve Goals: Good ADL Goals Pt Will Perform Grooming: standing;with min guard assist Pt Will Perform Lower Body Dressing: sitting/lateral leans;with adaptive equipment;with supervision;with set-up Pt Will Transfer to Toilet: ambulating;with min guard assist;bedside commode Pt Will Perform Tub/Shower Transfer: ambulating;tub bench;rolling walker;with min guard assist  OT Frequency: Min 2X/week    Co-evaluation PT/OT/SLP Co-Evaluation/Treatment: Yes            AM-PAC OT "6 Clicks" Daily Activity     Outcome Measure Help from another person eating meals?: None Help from another person taking care of personal grooming?: A Little Help from another person toileting, which includes using toliet, bedpan, or  urinal?: A Little Help from another person bathing (including washing, rinsing, drying)?: A Lot Help from another person to put on and taking off regular upper body clothing?: A Little Help from another person to put on and taking off regular lower body clothing?: Total 6 Click  Score: 16   End of Session Equipment Utilized During Treatment: Gait belt;Rolling walker (2 wheels) Nurse Communication: Mobility status  Activity Tolerance: Patient tolerated treatment well Patient left: in bed;with call bell/phone within reach;with family/visitor present  OT Visit Diagnosis: Unsteadiness on feet (R26.81);Pain;Muscle weakness (generalized) (M62.81) Pain - part of body:  (back)                Time: 5284-1324 OT Time Calculation (min): 47 min Charges:  OT General Charges $OT Visit: 1 Visit OT Evaluation $OT Eval Moderate Complexity: 1 Mod  01/15/2023  AB, OTR/L  Acute Rehabilitation Services  Office: [phone]   Tristan Schroeder 01/15/2023, 2:28 PM

## 2023-01-15 NOTE — Progress Notes (Signed)
Physical Therapy Treatment Patient Details Name: [name] [name] MRN: [id number] DOB: Aug 12, 1945 Today's Date: 01/15/2023   History of Present Illness Pt is 77 year old presented to [facility] on  01/12/23 for weakness and fever. Pt with severe sepsis due to UTI.  PMH - T12-L1 fx with ORIF 11/23/22, morbid obesity, OSA, OHS, DM2, HTN, HLD, COPD, severe pulmonary hypertension, chronic diastolic and right-sided CHF, chronic 3 to 5 L oxygen requirement, permanent A-fib on Eliquis, migraine, arthritis, gout    PT Comments    Pt with fair tolerance to treatment today. Co treat with OT. Session today limited by HR and pain. Pt able to transfer to [facility] and back with +2 Min A. Pt would likely benefit from a bigger clam shell brace. Recommend SNF upon DC. PT will continue to follow.   Recommendations for follow up therapy are one component of a multi-disciplinary discharge planning process, led by the attending physician.  Recommendations may be updated based on patient status, additional functional criteria and insurance authorization.  Follow Up Recommendations  Can patient physically be transported by private vehicle: No    Assistance Recommended at Discharge Frequent or constant Supervision/Assistance  Patient can return home with the following A lot of help with walking and/or transfers;A lot of help with bathing/dressing/bathroom;Assist for transportation   Equipment Recommendations  None recommended by PT    Recommendations for Other Services       Precautions / Restrictions Precautions Precautions: Fall;Back Required Braces or Orthoses: Spinal Brace Spinal Brace: Applied in standing position (Clam shell brace, pt too large to apply brace in sitting) Restrictions Weight Bearing Restrictions: No     Mobility  Bed Mobility Overal bed mobility: Needs Assistance Bed Mobility: Rolling, Sidelying to Sit, Sit to Supine Rolling: Albino assist, +2 for physical assistance Sidelying to sit: Avary assist   Sit  to supine: Rueben assist   General bed mobility comments: Pt rolled L, he reports rolling better to the R. needs assist with trunk and BLE control    Transfers Overall transfer level: Needs assistance Equipment used: Rolling walker (2 wheels) Transfers: Sit to/from Stand Sit to Stand: Min assist, +2 physical assistance           General transfer comment: HR up to 154 during transfer    Ambulation/Gait               General Gait Details: Deferred due to HR   Stairs             Wheelchair Mobility    Modified Rankin (Stroke Patients Only)       Balance Overall balance assessment: Needs assistance Sitting-balance support: Feet supported, No upper extremity supported Sitting balance-[name] Scale: Fair     Standing balance support: Bilateral upper extremity supported, During functional activity, Reliant on assistive device for balance Standing balance-[name] Scale: Poor                              Cognition Arousal/Alertness: Awake/alert Behavior During Therapy: WFL for tasks assessed/performed Overall Cognitive Status: Within Functional Limits for tasks assessed                                          Exercises      General Comments General comments (skin integrity, edema, etc.): HR between 105-112 seated EOB at rest. HR up  to 135-154 upon return to bed from [facility]- furhter mobility deferred for pt safety      Pertinent Vitals/Pain Pain Assessment Pain Assessment: Faces Faces Pain Scale: Hurts even more Pain Location: Per pt report, all over Pain Descriptors / Indicators: Grimacing, Guarding, Moaning    Home Living Family/patient expects to be discharged to:: Private residence Living Arrangements: Children Available Help at Discharge: Family;Available 24 hours/day Type of Home: House Home Access: Stairs to enter Entrance Stairs-Rails: Right;Left;Can reach both Entrance Stairs-Number of Steps: 6   Home Layout: One  level Home Equipment: Agricultural consultant (2 wheels);BSC/3in1;Wheelchair - manual;Hospital bed      Prior Function            PT Goals (current goals can now be found in the care plan section) Progress towards PT goals: Progressing toward goals    Frequency    Min 1X/week      PT Plan Discharge plan needs to be updated    Co-evaluation PT/OT/SLP Co-Evaluation/Treatment: Yes Reason for Co-Treatment: Complexity of the patient's impairments (multi-system involvement);For patient/therapist safety;To address functional/ADL transfers PT goals addressed during session: Mobility/safety with mobility;Proper use of DME OT goals addressed during session: ADL's and self-care      AM-PAC PT "6 Clicks" Mobility   Outcome Measure  Help needed turning from your back to your side while in a flat bed without using bedrails?: A Lot Help needed moving from lying on your back to sitting on the side of a flat bed without using bedrails?: A Lot Help needed moving to and from a bed to a chair (including a wheelchair)?: A Lot Help needed standing up from a chair using your arms (e.g., wheelchair or bedside chair)?: A Lot Help needed to walk in hospital room?: A Lot Help needed climbing 3-5 steps with a railing? : Total 6 Click Score: 11    End of Session Equipment Utilized During Treatment: Oxygen Activity Tolerance: Patient limited by pain;Treatment limited secondary to medical complications (Comment) (HR up to 154) Patient left: in bed;with call bell/phone within reach;with bed alarm set Nurse Communication: Mobility status PT Visit Diagnosis: Other abnormalities of gait and mobility (R26.89);History of falling (Z91.81);Muscle weakness (generalized) (M62.81);Pain     Time: 1320-1408 PT Time Calculation (min) (ACUTE ONLY): 48 min  Charges:  $Therapeutic Activity: 23-37 mins                     Shela Nevin, PT, DPT Acute Rehab Services [phone]    Gladys Damme 01/15/2023, 4:57 PM

## 2023-01-15 NOTE — Progress Notes (Addendum)
PROGRESS NOTE    [name] [name]  HYQ:[id number] DOB: 03/02/1946 DOA: 01/12/2023 PCP: Corwin Levins, MD     Brief Narrative:  Patient is a 77 year old male with history of paroxysmal A-fib on Eliquis, chronic diastolic CHF, COPD, chronic hypoxic respiratory failure on 4 L of oxygen at home, diabetes type 2 who presented from home with complaint of fever, chills, dysuria, lower abdominal pain.  He was just discharged from rehab to home.He lives with his daughter.On presentation, he was febrile, rapid A-fib, blood pressure was low.  UA was suspicious for UTI.  CT abdomen /pelvis showed left kidney stranding, mild hydronephrosis without obstruction.  Lab work showed elevated leukocytes.  Patient was admitted for the management of severe sepsis secondary to UTI.  Started on broad spectrum antibiotics.  Urine culture showing Klebsiella.    New events last 24 hours / Subjective: Patient reports feeling about the same.  Still on Rocephin for UTI.  No fevers, chest pain, or shortness of breath.  Appetite is fair.  Culture sensitivity has returned showing susceptibility to Rocephin.   Assessment & Plan:   Principal Problem:   Complicated UTI (urinary tract infection)  Cont Rocephin 2 g/d   Active Problems:   Bacteremia  Cont Rocephin 2 g/d  2nd set of BC's w NGTD    Chronic diastolic CHF (congestive heart failure) (HCC)  Lopressor 25 mg bid    Hyperlipidemia  Lipitor 20 mg daily    Chronic a-fib (HCC)  Eliquis 5 mg bid    Sepsis (HCC)  Monitor CBC  Blood cultures pending  DVT prophylaxis: Eliquis as above Code Status: Full Family Communication: Daughter was bedside Coming From: Home Disposition Plan: TBD Barriers to Discharge: Clinical improvement  Antimicrobials:  Anti-infectives (From admission, onward)    Start     Dose/Rate Route Frequency Ordered Stop   01/15/23 2200  cefdinir (OMNICEF) capsule 300 mg        300 mg Oral Every 12 hours 01/15/23 1348 01/20/23 2159   01/13/23 2145   vancomycin (VANCOREADY) IVPB 750 mg/150 mL  Status:  Discontinued        750 mg 150 mL/hr over 60 Minutes Intravenous Every 24 hours 01/12/23 2145 01/13/23 0927   01/13/23 1100  cefTRIAXone (ROCEPHIN) 2 g in sodium chloride 0.9 % 100 mL IVPB  Status:  Discontinued        2 g 200 mL/hr over 30 Minutes Intravenous Every 24 hours 01/13/23 0927 01/15/23 1348   01/13/23 1015  ceFEPIme (MAXIPIME) 2 g in sodium chloride 0.9 % 100 mL IVPB  Status:  Discontinued        2 g 200 mL/hr over 30 Minutes Intravenous Every 12 hours 01/12/23 2209 01/13/23 0927   01/12/23 2145  vancomycin (VANCOCIN) IVPB 1000 mg/200 mL premix       See Hyperspace for full Linked Orders Report.   1,000 mg 200 mL/hr over 60 Minutes Intravenous  Once 01/12/23 2143 01/13/23 0100   01/12/23 2145  vancomycin (VANCOCIN) IVPB 1000 mg/200 mL premix       See Hyperspace for full Linked Orders Report.   1,000 mg 200 mL/hr over 60 Minutes Intravenous  Once 01/12/23 2143 01/13/23 0100   01/12/23 2045  ceFEPIme (MAXIPIME) 2 g in sodium chloride 0.9 % 100 mL IVPB        2 g 200 mL/hr over 30 Minutes Intravenous  Once 01/12/23 2039 01/12/23 2129   01/12/23 2045  metroNIDAZOLE (FLAGYL) IVPB 500 mg  500 mg 100 mL/hr over 60 Minutes Intravenous  Once 01/12/23 2039 01/12/23 2233   01/12/23 2045  vancomycin (VANCOCIN) IVPB 1000 mg/200 mL premix  Status:  Discontinued        1,000 mg 200 mL/hr over 60 Minutes Intravenous  Once 01/12/23 2039 01/12/23 2143        Objective: Vitals:   01/14/23 2034 01/15/23 0042 01/15/23 0422 01/15/23 0700  BP: 126/74 114/71 131/80 132/81  Pulse: (!) 104 98 100 99  Resp: 20 20 20 20   Temp: 98.3 F (36.8 C) 98.8 F (37.1 C) 97.9 F (36.6 C) 97.8 F (36.6 C)  TempSrc: Oral Oral Oral Oral  SpO2: 95% 99% 97% 99%  Weight:   115.5 kg   Height:        Intake/Output Summary (Last 24 hours) at 01/15/2023 1354 Last data filed at 01/15/2023 1141 Gross per 24 hour  Intake --  Output 1300 ml  Net  -1300 ml   Filed Weights   01/13/23 0328 01/14/23 0358 01/15/23 0422  Weight: 113 kg 114.6 kg 115.5 kg    Examination:  General exam: Appears calm and comfortable  Respiratory system: Clear to auscultation. Respiratory effort normal. No respiratory distress. No conversational dyspnea.  Cardiovascular system: S1 & S2 heard, RRR. No murmurs. Gastrointestinal system: Abdomen is diffusely ttp, soft and nontender. Normal bowel sounds heard. Central nervous system: Alert and oriented. No focal neurological deficits. Speech clear.  Extremities: Symmetric in appearance  Skin: No rashes, lesions or ulcers on exposed skin  Psychiatry: Judgement and insight appear normal. Mood & affect appropriate.   Data Reviewed: I have personally reviewed following labs and imaging studies  CBC: Recent Labs  Lab 01/12/23 2034 01/12/23 2128 01/14/23 0827 01/15/23 0122  WBC 18.9*  --  23.1* 20.9*  NEUTROABS 15.9*  --   --   --   HGB 12.9* 11.2* 10.7* 10.5*  HCT 40.0 33.0* 33.8* 33.3*  MCV 89.5  --  93.1 92.0  PLT 314  --  208 218   Basic Metabolic Panel: Recent Labs  Lab 01/12/23 2034 01/12/23 2128 01/13/23 1256 01/14/23 0827 01/15/23 0122  NA 140 140  --  141 141  K 3.5 3.0*  --  3.8 4.0  CL 100  --   --  107 106  CO2 28  --   --  26 26  GLUCOSE 105*  --   --  99 114*  BUN 37*  --   --  25* 25*  CREATININE 1.98*  --   --  1.97* 1.83*  CALCIUM 9.7  --   --  8.7* 8.8*  MG  --   --  1.4* 1.6* 2.0  PHOS  --   --  1.7* 4.2  --    GFR: Estimated Creatinine Clearance: 41.7 mL/min (A) (by C-G formula based on SCr of 1.83 mg/dL (H)).  Liver Function Tests: Recent Labs  Lab 01/12/23 2034  AST 18  ALT 26  ALKPHOS 56  BILITOT 0.8  PROT 7.3  ALBUMIN 2.9*   Coagulation Profile: Recent Labs  Lab 01/12/23 2034  INR 1.5*   CBG: Recent Labs  Lab 01/14/23 1122 01/14/23 1643 01/14/23 2109 01/15/23 0622 01/15/23 1147  GLUCAP 116* 107* 106* 91 125*   Sepsis Labs: Recent Labs  Lab  01/12/23 2034 01/12/23 2237  LATICACIDVEN 2.3* 1.3    Recent Results (from the past 240 hour(s))  Culture, blood (Routine x 2)     Status: Abnormal   Collection  Time: 01/12/23  8:24 PM   Specimen: BLOOD LEFT ARM  Result Value Ref Range Status   Specimen Description   Final    BLOOD LEFT ARM Performed at [facility], [address], [place], [place] [address]    Special Requests   Final    BOTTLES DRAWN AEROBIC AND ANAEROBIC Blood Culture adequate volume Performed at [facility], [address] [place], [place] [address]    Culture  Setup Time   Final    GRAM NEGATIVE RODS AEROBIC BOTTLE ONLY CRITICAL RESULT CALLED TO, READ BACK BY AND VERIFIED WITH: Vivien Rossetti [id number] @1348  BY SM Performed at [facility], [address], [place], [place] [address]    Culture KLEBSIELLA PNEUMONIAE (A)  Final   Report Status 01/15/2023 FINAL  Final   Organism ID, Bacteria KLEBSIELLA PNEUMONIAE  Final      Susceptibility   Klebsiella pneumoniae - MIC*    AMPICILLIN >=32 RESISTANT Resistant     CEFEPIME <=0.12 SENSITIVE Sensitive     CEFTAZIDIME <=1 SENSITIVE Sensitive     CEFTRIAXONE <=0.25 SENSITIVE Sensitive     CIPROFLOXACIN <=0.25 SENSITIVE Sensitive     GENTAMICIN <=1 SENSITIVE Sensitive     IMIPENEM <=0.25 SENSITIVE Sensitive     TRIMETH/SULFA <=20 SENSITIVE Sensitive     AMPICILLIN/SULBACTAM 8 SENSITIVE Sensitive     PIP/TAZO <=4 SENSITIVE Sensitive     * KLEBSIELLA PNEUMONIAE  Blood Culture ID Panel (Reflexed)     Status: Abnormal   Collection Time: 01/12/23  8:24 PM  Result Value Ref Range Status   Enterococcus faecalis NOT DETECTED NOT DETECTED Final   Enterococcus Faecium NOT DETECTED NOT DETECTED Final   Listeria monocytogenes NOT DETECTED NOT DETECTED Final   Staphylococcus species NOT DETECTED NOT DETECTED Final   Staphylococcus aureus (BCID) NOT DETECTED NOT DETECTED Final   Staphylococcus epidermidis NOT DETECTED NOT DETECTED Final    Staphylococcus lugdunensis NOT DETECTED NOT DETECTED Final   Streptococcus species NOT DETECTED NOT DETECTED Final   Streptococcus agalactiae NOT DETECTED NOT DETECTED Final   Streptococcus pneumoniae NOT DETECTED NOT DETECTED Final   Streptococcus pyogenes NOT DETECTED NOT DETECTED Final   A.calcoaceticus-baumannii NOT DETECTED NOT DETECTED Final   Bacteroides fragilis NOT DETECTED NOT DETECTED Final   Enterobacterales DETECTED (A) NOT DETECTED Final    Comment: Enterobacterales represent a large order of gram negative bacteria, not a single organism. CRITICAL RESULT CALLED TO, READ BACK BY AND VERIFIED WITH: PHARMD ELIZABETH [id number] @1348  BY SM    Enterobacter cloacae complex NOT DETECTED NOT DETECTED Final   Escherichia coli NOT DETECTED NOT DETECTED Final   Klebsiella aerogenes NOT DETECTED NOT DETECTED Final   Klebsiella oxytoca NOT DETECTED NOT DETECTED Final   Klebsiella pneumoniae DETECTED (A) NOT DETECTED Final    Comment: CRITICAL RESULT CALLED TO, READ BACK BY AND VERIFIED WITH: PHARMD ELIZABETH [id number] @1348  BY SM    Proteus species NOT DETECTED NOT DETECTED Final   Salmonella species NOT DETECTED NOT DETECTED Final   Serratia marcescens NOT DETECTED NOT DETECTED Final   Haemophilus influenzae NOT DETECTED NOT DETECTED Final   Neisseria meningitidis NOT DETECTED NOT DETECTED Final   Pseudomonas aeruginosa NOT DETECTED NOT DETECTED Final   Stenotrophomonas maltophilia NOT DETECTED NOT DETECTED Final   Candida albicans NOT DETECTED NOT DETECTED Final   Candida auris NOT DETECTED NOT DETECTED Final   Candida glabrata NOT DETECTED NOT DETECTED Final   Candida krusei NOT DETECTED NOT DETECTED Final  Candida parapsilosis NOT DETECTED NOT DETECTED Final   Candida tropicalis NOT DETECTED NOT DETECTED Final   Cryptococcus neoformans/gattii NOT DETECTED NOT DETECTED Final   CTX-M ESBL NOT DETECTED NOT DETECTED Final   Carbapenem resistance IMP NOT DETECTED NOT DETECTED Final    Carbapenem resistance KPC NOT DETECTED NOT DETECTED Final   Carbapenem resistance NDM NOT DETECTED NOT DETECTED Final   Carbapenem resist OXA 48 LIKE NOT DETECTED NOT DETECTED Final   Carbapenem resistance VIM NOT DETECTED NOT DETECTED Final    Comment: Performed at [facility], [address] [place], [place] [address]  Culture, blood (Routine x 2)     Status: Abnormal   Collection Time: 01/12/23  8:26 PM   Specimen: BLOOD RIGHT FOREARM  Result Value Ref Range Status   Specimen Description   Final    BLOOD RIGHT FOREARM Performed at [facility], [address] [place], [place] [address]    Special Requests   Final    BOTTLES DRAWN AEROBIC AND ANAEROBIC Blood Culture results may not be optimal due to an excessive volume of blood received in culture bottles Performed at [facility], [address], [place], [place] [address]    Culture  Setup Time   Final    GRAM NEGATIVE RODS IN BOTH AEROBIC AND ANAEROBIC BOTTLES CRITICAL VALUE NOTED.  VALUE IS CONSISTENT WITH PREVIOUSLY REPORTED AND CALLED VALUE.    Culture (A)  Final    KLEBSIELLA PNEUMONIAE SUSCEPTIBILITIES PERFORMED ON PREVIOUS CULTURE WITHIN THE LAST 5 DAYS. Performed at [facility], [address], [place], [place] [address]    Report Status 01/15/2023 FINAL  Final  Resp panel by RT-PCR (RSV, Flu A&B, Covid) Anterior Nasal Swab     Status: None   Collection Time: 01/12/23  8:39 PM   Specimen: Anterior Nasal Swab  Result Value Ref Range Status   SARS Coronavirus 2 by RT PCR NEGATIVE NEGATIVE Final    Comment: (NOTE) SARS-CoV-2 target nucleic acids are NOT DETECTED.  The SARS-CoV-2 RNA is generally detectable in upper respiratory specimens during the acute phase of infection. The lowest concentration of SARS-CoV-2 viral copies this assay can detect is 138 copies/mL. A negative result does not preclude SARS-Cov-2 infection and should not be used as the sole basis for treatment  or other patient management decisions. A negative result may occur with  improper specimen collection/handling, submission of specimen other than nasopharyngeal swab, presence of viral mutation(s) within the areas targeted by this assay, and inadequate number of viral copies(<138 copies/mL). A negative result must be combined with clinical observations, patient history, and epidemiological information. The expected result is Negative.  Fact Sheet for Patients:  BloggerCourse.com  Fact Sheet for Healthcare Providers:  SeriousBroker.it  This test is no t yet approved or cleared by the [place] FDA and  has been authorized for detection and/or diagnosis of SARS-CoV-2 by FDA under an Emergency Use Authorization (EUA). This EUA will remain  in effect (meaning this test can be used) for the duration of the COVID-19 declaration under Section 564(b)(1) of the Act, 21 U.S.C.section 360bbb-3(b)(1), unless the authorization is terminated  or revoked sooner.       Influenza A by PCR NEGATIVE NEGATIVE Final   Influenza B by PCR NEGATIVE NEGATIVE Final    Comment: (NOTE) The Xpert Xpress SARS-CoV-2/FLU/RSV plus assay is intended as an aid in the diagnosis of influenza from Nasopharyngeal swab specimens and should not be used as a sole  basis for treatment. Nasal washings and aspirates are unacceptable for Xpert Xpress SARS-CoV-2/FLU/RSV testing.  Fact Sheet for Patients: BloggerCourse.com  Fact Sheet for Healthcare Providers: SeriousBroker.it  This test is not yet approved or cleared by the [place] FDA and has been authorized for detection and/or diagnosis of SARS-CoV-2 by FDA under an Emergency Use Authorization (EUA). This EUA will remain in effect (meaning this test can be used) for the duration of the COVID-19 declaration under Section 564(b)(1) of the Act, 21 U.S.C. section  360bbb-3(b)(1), unless the authorization is terminated or revoked.     Resp Syncytial Virus by PCR NEGATIVE NEGATIVE Final    Comment: (NOTE) Fact Sheet for Patients: BloggerCourse.com  Fact Sheet for Healthcare Providers: SeriousBroker.it  This test is not yet approved or cleared by the [place] FDA and has been authorized for detection and/or diagnosis of SARS-CoV-2 by FDA under an Emergency Use Authorization (EUA). This EUA will remain in effect (meaning this test can be used) for the duration of the COVID-19 declaration under Section 564(b)(1) of the Act, 21 U.S.C. section 360bbb-3(b)(1), unless the authorization is terminated or revoked.  Performed at [facility], [address] [place], [place] [address]   Urine Culture     Status: Abnormal   Collection Time: 01/12/23 10:37 PM   Specimen: Urine, Random  Result Value Ref Range Status   Specimen Description   Final    URINE, RANDOM Performed at [facility], [address], [place], [place] [address]    Special Requests   Final    NONE Reflexed from [id number] Performed at [facility], [address] [place], [place] [address]    Culture 20,000 COLONIES/mL KLEBSIELLA PNEUMONIAE (A)  Final   Report Status 01/15/2023 FINAL  Final   Organism ID, Bacteria KLEBSIELLA PNEUMONIAE (A)  Final      Susceptibility   Klebsiella pneumoniae - MIC*    AMPICILLIN >=32 RESISTANT Resistant     CEFAZOLIN <=4 SENSITIVE Sensitive     CEFEPIME <=0.12 SENSITIVE Sensitive     CEFTRIAXONE <=0.25 SENSITIVE Sensitive     CIPROFLOXACIN <=0.25 SENSITIVE Sensitive     GENTAMICIN <=1 SENSITIVE Sensitive     IMIPENEM <=0.25 SENSITIVE Sensitive     NITROFURANTOIN 64 INTERMEDIATE Intermediate     TRIMETH/SULFA <=20 SENSITIVE Sensitive     AMPICILLIN/SULBACTAM 4 SENSITIVE Sensitive     PIP/TAZO <=4 SENSITIVE Sensitive     * 20,000  COLONIES/mL KLEBSIELLA PNEUMONIAE  Culture, blood (Routine X 2) w Reflex to ID Panel     Status: None (Preliminary result)   Collection Time: 01/14/23  8:28 AM   Specimen: BLOOD  Result Value Ref Range Status   Specimen Description BLOOD LEFT ANTECUBITAL  Final   Special Requests   Final    BOTTLES DRAWN AEROBIC ONLY Blood Culture results may not be optimal due to an inadequate volume of blood received in culture bottles   Culture   Final    NO GROWTH < 24 HOURS Performed at [facility], [address], [place], [place] [address]    Report Status PENDING  Incomplete  Culture, blood (Routine X 2) w Reflex to ID Panel     Status: None (Preliminary result)   Collection Time: 01/14/23  8:28 AM   Specimen: BLOOD LEFT HAND  Result Value Ref Range Status   Specimen Description BLOOD LEFT HAND  Final   Special Requests   Final    BOTTLES DRAWN AEROBIC ONLY Blood Culture  results may not be optimal due to an inadequate volume of blood received in culture bottles   Culture   Final    NO GROWTH < 24 HOURS Performed at [facility], [address] [place], [place] [address]    Report Status PENDING  Incomplete      Radiology Studies: DG Chest 1 View  Result Date: 01/14/2023 CLINICAL DATA:  Shortness of breath. EXAM: CHEST  1 VIEW COMPARISON:  January 12, 2023. FINDINGS: Stable cardiomegaly. Hypoinflation of the lungs is noted with minimal bibasilar subsegmental atelectasis and small pleural effusions. Bony thorax is unremarkable. IMPRESSION: Hypoinflation of the lungs with minimal bibasilar subsegmental atelectasis and small pleural effusions. Electronically Signed   By: Lupita Raider M.D.   On: 01/14/2023 08:54     Scheduled Meds:  allopurinol  100 mg Oral Daily   apixaban  5 mg Oral BID   atorvastatin  20 mg Oral Daily   cefdinir  300 mg Oral Q12H   Chlorhexidine Gluconate Cloth  6 each Topical Daily   insulin aspart  0-9 Units Subcutaneous TID WC   metoprolol tartrate  25 mg  Oral BID   senna-docusate  1 tablet Oral BID   tamsulosin  0.4 mg Oral QPC supper   Continuous Infusions:  sodium chloride 100 mL/hr at 01/15/23 0954     LOS: 2 days    Time spent: 35 minutes   Sharlene Dory, DO [facility] 01/15/2023, 1:54 PM   Available via Epic secure chat 7am-7pm After these hours, please refer to coverage provider listed on [email or web address]

## 2023-01-16 DIAGNOSIS — N390 Urinary tract infection, site not specified: Secondary | ICD-10-CM | POA: Diagnosis not present

## 2023-01-16 LAB — GLUCOSE, CAPILLARY
Glucose-Capillary: 129 mg/dL — ABNORMAL HIGH (ref 70–99)
Glucose-Capillary: 114 mg/dL — ABNORMAL HIGH (ref 70–99)
Glucose-Capillary: 129 mg/dL — ABNORMAL HIGH (ref 70–99)

## 2023-01-16 LAB — CBC
HCT: 35.2 % — ABNORMAL LOW (ref 39.0–52.0)
Hemoglobin: 11.1 g/dL — ABNORMAL LOW (ref 13.0–17.0)
MCH: 29.8 pg (ref 26.0–34.0)
MCHC: 31.5 g/dL (ref 30.0–36.0)
MCV: 94.6 fL (ref 80.0–100.0)
Platelets: 212 10*3/uL (ref 150–400)
RBC: 3.72 MIL/uL — ABNORMAL LOW (ref 4.22–5.81)
RDW: 17 % — ABNORMAL HIGH (ref 11.5–15.5)
WBC: 15.9 10*3/uL — ABNORMAL HIGH (ref 4.0–10.5)
nRBC: 0 % (ref 0.0–0.2)

## 2023-01-16 LAB — CULTURE, BLOOD (ROUTINE X 2)

## 2023-01-16 LAB — BASIC METABOLIC PANEL
Anion gap: 8 (ref 5–15)
BUN: 25 mg/dL — ABNORMAL HIGH (ref 8–23)
CO2: 27 mmol/L (ref 22–32)
Calcium: 9 mg/dL (ref 8.9–10.3)
Chloride: 109 mmol/L (ref 98–111)
Creatinine, Ser: 1.65 mg/dL — ABNORMAL HIGH (ref 0.61–1.24)
GFR, Estimated: 43 mL/min — ABNORMAL LOW (ref >60)
Glucose, Bld: 155 mg/dL — ABNORMAL HIGH (ref 70–99)
Potassium: 3.7 mmol/L (ref 3.5–5.1)
Sodium: 144 mmol/L (ref 135–145)

## 2023-01-16 MED ORDER — AMOXICILLIN-POT CLAVULANATE 875-125 MG PO TABS
1.0000 | ORAL_TABLET | Freq: Two times a day (BID) | ORAL | Status: DC
  Administered 2023-01-17: 1 via ORAL
  Filled 2023-01-16: qty 1

## 2023-01-16 NOTE — [place] FL2 (Signed)
MEDICAID FL2 LEVEL OF CARE FORM     IDENTIFICATION  Patient Name: [name] [name] Birthdate: Jan 27, 1946 Sex: male Admission Date (Current Location): 01/12/2023  [facility] and [place] Number:  Producer, television/film/video and Address:  [facility] [place], [address], [place], [place] [address]      Provider Number: [id number]  Attending Physician Name and Address:  Sharlene Dory,*  Relative Name and Phone Number:  [name] (daughter) [phone]    Current Level of Care: Hospital Recommended Level of Care: Skilled Nursing Facility Prior Approval Number:    Date Approved/Denied:   PASRR Number: [id number]  Discharge Plan: SNF    Current Diagnoses: Patient Active Problem List   Diagnosis Date Noted   Bacteremia 01/15/2023   Complicated UTI (urinary tract infection) 01/13/2023   Sepsis (HCC) 01/13/2023   Closed fracture of eleventh thoracic vertebra (HCC) 11/23/2022   Closed L2 vertebral fracture (HCC) 11/18/2022   Chronic a-fib (HCC) 11/18/2022   L1 vertebral fracture (HCC) 11/18/2022   Subacute cough 10/10/2022   Fatigue 10/08/2022   Chronic respiratory failure with hypoxia and hypercapnia related to obesity hypoventilation syndrome 03/19/2022   Chronic diastolic CHF (congestive heart failure) (HCC) 03/19/2022   Actinic keratosis 11/27/2021   Sebaceous cyst 11/27/2021   Urinary frequency 10/17/2021   Left arm swelling 01/18/2021   Vitamin D deficiency 10/10/2020   B12 deficiency 10/10/2020   Lower extremity weakness 09/12/2020   Lesion of right ear 04/13/2020   Arthritis of right acromioclavicular joint 04/02/2020   Right rotator cuff tear 04/02/2020   Greater trochanteric bursitis, right 08/14/2019   Tick bite, infected 12/19/2018   Degenerative arthritis of right knee 02/21/2017   Right knee pain 02/15/2017   Volume overload 12/23/2015   OSA treated with BiPAP 12/23/2015   Pulmonary hypertension (HCC) 12/23/2015   Gout 12/23/2015    Allergic rhinitis, cause unspecified 11/21/2013   DOE (dyspnea on exertion) 12/20/2012   Left knee pain 07/11/2012   Increased prostate specific antigen (PSA) velocity 01/06/2012   Encounter for well adult exam with abnormal findings 12/25/2010   Long term (current) use of anticoagulants 11/06/2010   HEMATOCHEZIA 06/26/2010   BRONCHITIS, CHRONIC 01/02/2009   Obesity hypoventilation syndrome (HCC) 11/26/2008   Permanent atrial fibrillation 10/16/2008   PERIPHERAL EDEMA 01/05/2008   Non-insulin dependent type 2 diabetes mellitus (HCC) 06/09/2007   COMMON MIGRAINE 06/09/2007   Unspecified hearing loss 06/09/2007   PROSTATE SPECIFIC ANTIGEN, ELEVATED 06/09/2007   Hyperlipidemia 02/18/2007   Severe obesity (BMI >= 40) (HCC) 02/18/2007   Essential hypertension 02/18/2007    Orientation RESPIRATION BLADDER Height & Weight     Self, Time, Situation, Place  O2 (Nasal Cannula 2 liters) Continent (Urethral Catheter) Weight: 258 lb 13.1 oz (117.4 kg) Height:  5\' 8"  (172.7 cm)  BEHAVIORAL SYMPTOMS/MOOD NEUROLOGICAL BOWEL NUTRITION STATUS      Incontinent Diet (Please see DC summary)  AMBULATORY STATUS COMMUNICATION OF NEEDS Skin   Extensive Assist Verbally Other (Comment) (WDL,dry,flaky,Wound/Incision LDAs,PI buttocks,R,stage 2)                       Personal Care Assistance Level of Assistance  Bathing, Feeding, Dressing Bathing Assistance: Maximum assistance Feeding assistance: Independent Dressing Assistance: Maximum assistance     Functional Limitations Info  Sight, Hearing, Speech Sight Info: Adequate Hearing Info: Impaired Speech Info: Adequate    SPECIAL CARE FACTORS FREQUENCY  PT (By licensed PT), OT (By licensed OT)  PT Frequency: 5x min weekly OT Frequency: 5x min weekly            Contractures Contractures Info: Not present    Additional Factors Info  Code Status, Allergies, Insulin Sliding Scale Code Status Info: FULL Allergies Info:  Codeine,Heparin   Insulin Sliding Scale Info: insulin aspart (novoLOG) injection 0-9 Units 3 times daily with meals       Current Medications (01/16/2023):  This is the current hospital active medication list Current Facility-Administered Medications  Medication Dose Route Frequency Provider Last Rate Last Admin   0.9 %  sodium chloride infusion   Intravenous Continuous Burnadette Pop, MD 100 mL/hr at 01/15/23 2057 New Bag at 01/15/23 2057   acetaminophen (TYLENOL) tablet 650 mg  650 mg Oral Q6H PRN Darlin Drop, DO   650 mg at 01/16/23 1138   allopurinol (ZYLOPRIM) tablet 100 mg  100 mg Oral Daily Mikey College T, MD   100 mg at 01/16/23 0827   [START ON 01/17/2023] amoxicillin-clavulanate (AUGMENTIN) 875-125 MG per tablet 1 tablet  1 tablet Oral Q12H Sharlene Dory, DO       apixaban Everlene Balls) tablet 5 mg  5 mg Oral BID Mikey College T, MD   5 mg at 01/16/23 0827   atorvastatin (LIPITOR) tablet 20 mg  20 mg Oral Daily Mikey College T, MD   20 mg at 01/16/23 0827   Chlorhexidine Gluconate Cloth 2 % PADS 6 each  6 each Topical Daily Burnadette Pop, MD   6 each at 01/16/23 0942   insulin aspart (novoLOG) injection 0-9 Units  0-9 Units Subcutaneous TID WC Mikey College T, MD   1 Units at 01/16/23 4098   melatonin tablet 5 mg  5 mg Oral QHS PRN Dow Adolph N, DO   5 mg at 01/13/23 2054   metoprolol tartrate (LOPRESSOR) injection 2.5 mg  2.5 mg Intravenous Q6H PRN Mikey College T, MD       metoprolol tartrate (LOPRESSOR) tablet 25 mg  25 mg Oral BID Burnadette Pop, MD   25 mg at 01/16/23 0827   Muscle Rub CREA   Topical PRN Mikey College T, MD   Given at 01/13/23 2355   polyethylene glycol (MIRALAX / GLYCOLAX) packet 17 g  17 g Oral Daily PRN Dow Adolph N, DO       prochlorperazine (COMPAZINE) injection 5 mg  5 mg Intravenous Q6H PRN Dow Adolph N, DO   5 mg at 01/14/23 0415   senna-docusate (Senokot-S) tablet 1 tablet  1 tablet Oral BID Mikey College T, MD   1 tablet at 01/16/23 0827    tamsulosin (FLOMAX) capsule 0.4 mg  0.4 mg Oral QPC supper Mikey College T, MD   0.4 mg at 01/15/23 1713     Discharge Medications: Please see discharge summary for a list of discharge medications.  Relevant Imaging Results:  Relevant Lab Results:   Additional Information [id number]  Delilah Shan, LCSWA

## 2023-01-16 NOTE — TOC Initial Note (Signed)
Transition of Care [facility]) - Initial/Assessment Note    Patient Details  Name: [name] [name] MRN: [id number] Date of Birth: 10-23-1945  Transition of Care [facility]) CM/SW Contact:    Delilah Shan, LCSWA Phone Number: 01/16/2023, 12:05 PM  Clinical Narrative:                  CSW received consult for possible SNF placement at time of discharge. Patient gave permission for CSW to speak with daughter [name] [name] at bedside.CSW spoke with patients daughter [name] [name] regarding PT recommendation of SNF placement for patient at time of discharge. Patients daughter expressed understanding of PT recommendation and is agreeable to SNF placement for patient at time of discharge. Patient reports preference for [facility] . Patients daughter gave CSW permission to fax out initial referral near the [place] area for possible SNF placement.CSW discussed insurance authorization process.No further questions reported at this time. CSW to continue to follow and assist with discharge planning needs.   Expected Discharge Plan: Skilled Nursing Facility Barriers to Discharge: Continued Medical Work up   Patient Goals and CMS Choice Patient states their goals for this hospitalization and ongoing recovery are:: SNF CMS Medicare.gov Compare Post Acute Care list provided to:: Patient Represenative (must comment) Choice offered to / list presented to : Adult Children (Patients daughter [name] [name])      Expected Discharge Plan and Services In-house Referral: Clinical Social Work Discharge Planning Services: CM Consult Post Acute Care Choice: Home Health Living arrangements for the past 2 months: Single Family Home                 DME Arranged: N/A DME Agency: NA       HH Arranged: RN, PT, OT, Speech Therapy HH Agency: [facility] Date HH Agency Contacted: 01/14/23 Time HH Agency Contacted: 1345 Representative spoke with at [facility] Agency: Morrie Sheldon  Prior Living Arrangements/Services Living arrangements for  the past 2 months: Single Family Home Lives with:: Adult Children Patient language and need for interpreter reviewed:: Yes Do you feel safe going back to the place where you live?: No   SNF  Need for Family Participation in Patient Care: Yes (Comment) Care giver support system in place?: Yes (comment) Current home services: DME, Home PT, Home RN, Home OT, Other (comment) (hospital bed, walker, w/chair, 3 n 1, oxygen 4 liters [employer], active with [facility] for [facility]) Criminal Activity/Legal Involvement Pertinent to Current Situation/Hospitalization: No - Comment as needed  Activities of Daily Living      Permission Sought/Granted Permission sought to share information with : Case Manager, Family Supports, Magazine features editor Permission granted to share information with : Yes, Verbal Permission Granted  Share Information with NAME: [name] [name]  Permission granted to share info w AGENCY: SNF  Permission granted to share info w Relationship: Daughter  Permission granted to share info w Contact Information: [name] [name] [phone]  Emotional Assessment Appearance:: Appears stated age Attitude/Demeanor/Rapport: Gracious Affect (typically observed): Calm Orientation: : Oriented to Self, Oriented to Place, Oriented to  Time, Oriented to Situation Alcohol / Substance Use: Not Applicable Psych Involvement: No (comment)  Admission diagnosis:  Lower urinary tract obstruction [N13.9] Complicated UTI (urinary tract infection) [N39.0] Acute pyelonephritis [N10] Severe sepsis with acute organ dysfunction (HCC) [A41.9, R65.20] Sepsis (HCC) [A41.9] Patient Active Problem List   Diagnosis Date Noted   Bacteremia 01/15/2023   Complicated UTI (urinary tract infection) 01/13/2023   Sepsis (HCC) 01/13/2023   Closed fracture of eleventh thoracic vertebra (HCC) 11/23/2022   Closed  L2 vertebral fracture (HCC) 11/18/2022   Chronic a-fib (HCC) 11/18/2022   L1 vertebral fracture (HCC) 11/18/2022   Subacute  cough 10/10/2022   Fatigue 10/08/2022   Chronic respiratory failure with hypoxia and hypercapnia related to obesity hypoventilation syndrome 03/19/2022   Chronic diastolic CHF (congestive heart failure) (HCC) 03/19/2022   Actinic keratosis 11/27/2021   Sebaceous cyst 11/27/2021   Urinary frequency 10/17/2021   Left arm swelling 01/18/2021   Vitamin D deficiency 10/10/2020   B12 deficiency 10/10/2020   Lower extremity weakness 09/12/2020   Lesion of right ear 04/13/2020   Arthritis of right acromioclavicular joint 04/02/2020   Right rotator cuff tear 04/02/2020   Greater trochanteric bursitis, right 08/14/2019   Tick bite, infected 12/19/2018   Degenerative arthritis of right knee 02/21/2017   Right knee pain 02/15/2017   Volume overload 12/23/2015   OSA treated with BiPAP 12/23/2015   Pulmonary hypertension (HCC) 12/23/2015   Gout 12/23/2015   Allergic rhinitis, cause unspecified 11/21/2013   DOE (dyspnea on exertion) 12/20/2012   Left knee pain 07/11/2012   Increased prostate specific antigen (PSA) velocity 01/06/2012   Encounter for well adult exam with abnormal findings 12/25/2010   Long term (current) use of anticoagulants 11/06/2010   HEMATOCHEZIA 06/26/2010   BRONCHITIS, CHRONIC 01/02/2009   Obesity hypoventilation syndrome (HCC) 11/26/2008   Permanent atrial fibrillation 10/16/2008   PERIPHERAL EDEMA 01/05/2008   Non-insulin dependent type 2 diabetes mellitus (HCC) 06/09/2007   COMMON MIGRAINE 06/09/2007   Unspecified hearing loss 06/09/2007   PROSTATE SPECIFIC ANTIGEN, ELEVATED 06/09/2007   Hyperlipidemia 02/18/2007   Severe obesity (BMI >= 40) (HCC) 02/18/2007   Essential hypertension 02/18/2007   PCP:  Corwin Levins, MD Pharmacy:   [facility] [phone] - [place], [place] - [address] AT [facility] [address] [place] [address] Phone: [phone] Fax: [phone]  [facility] - [place], [place] -  [address] [place] [address] Phone: [phone] Fax: [phone]  [facility] [phone] - [place], [place] - [address] [place] [address] Phone: [phone] Fax: [phone]     Social Determinants of Health (SDOH) Social History: SDOH Screenings   Food Insecurity: No Food Insecurity (11/25/2022)  Housing: Low Risk  (11/25/2022)  Transportation Needs: No Transportation Needs (11/25/2022)  Utilities: Not At Risk (11/25/2022)  Depression (PHQ2-9): Low Risk  (10/08/2022)  Tobacco Use: Medium Risk (01/12/2023)   SDOH Interventions:     Readmission Risk Interventions    01/14/2023    1:35 PM  Readmission Risk Prevention Plan  Transportation Screening Complete  Medication Review (RN Care Manager) Complete  PCP or Specialist appointment within 3-5 days of discharge Complete  HRI or Home Care Consult Complete  Palliative Care Screening Not Applicable  Skilled Nursing Facility Not Applicable

## 2023-01-16 NOTE — Progress Notes (Addendum)
PROGRESS NOTE    [name] [name]  ZHY:[id number] DOB: 11/25/1945 DOA: 01/12/2023 PCP: Corwin Levins, MD     Brief Narrative:  Patient is a 77 year old male with history of paroxysmal A-fib on Eliquis, chronic diastolic CHF, COPD, chronic hypoxic respiratory failure on 4 L of oxygen at home, diabetes type 2 who presented from home with complaint of fever, chills, dysuria, lower abdominal pain.  He was just discharged from rehab to home. He lives with his daughter. On presentation, he was febrile, rapid A-fib, blood pressure was low.  UA was suspicious for UTI.  CT abdomen /pelvis showed left kidney stranding, mild hydronephrosis without obstruction.  Lab work showed leukocytosis.  Patient was admitted for the management of severe sepsis secondary to UTI.  Started on broad spectrum antibiotics.  Urine and blood cultures showing Klebsiella.   New events last 24 hours / Subjective: Patient reports feeling little bit better today.  Still little painful in the abdominal region.  No fevers, chest pain, shortness of breath.  He would like ice cream.  Assessment & Plan:   Principal Problem:   Complicated UTI (urinary tract infection)             Cont Rocephin 2 g/d; transition to PO Augmentin starting tomorrow 01/17/23  Urine culture showed Klebsiella sensitive to Rocephin and Unasyn              Active Problems:   Bacteremia             Cont Rocephin 2 g/d; transition to PO Augmentin starting tomorrow 01/17/23             2nd set of BC's w NGTD     Chronic diastolic CHF (congestive heart failure) (HCC)             Lopressor 25 mg bid     Hyperlipidemia             Lipitor 20 mg daily     Chronic a-fib (HCC)             Eliquis 5 mg bid   Lopressor     Sepsis (HCC)             Monitor CBC, leukocytosis improving             Repeat blood cultures pending  Continue telemetry for now    BPH  Flomax 0.4 mg daily    Gout  Cont allopurinol 100 mg daily  DVT prophylaxis: Eliquis Code Status:  Full Family Communication: None at bedside; spoke with daughter [name] yesterday Coming From: Home Disposition Plan: SNF Barriers to Discharge: SNF placement, clinical improvement  Consultants:  None  Procedures:  None  Antimicrobials:  Anti-infectives (From admission, onward)    Start     Dose/Rate Route Frequency Ordered Stop   01/16/23 1200  cefTRIAXone (ROCEPHIN) 2 g in sodium chloride 0.9 % 100 mL IVPB        2 g 200 mL/hr over 30 Minutes Intravenous Every 24 hours 01/15/23 1402     01/15/23 2200  cefdinir (OMNICEF) capsule 300 mg  Status:  Discontinued        300 mg Oral Every 12 hours 01/15/23 1348 01/15/23 1401   01/13/23 2145  vancomycin (VANCOREADY) IVPB 750 mg/150 mL  Status:  Discontinued        750 mg 150 mL/hr over 60 Minutes Intravenous Every 24 hours 01/12/23 2145 01/13/23 0927   01/13/23 1100  cefTRIAXone (ROCEPHIN) 2 g in  sodium chloride 0.9 % 100 mL IVPB  Status:  Discontinued        2 g 200 mL/hr over 30 Minutes Intravenous Every 24 hours 01/13/23 0927 01/15/23 1348   01/13/23 1015  ceFEPIme (MAXIPIME) 2 g in sodium chloride 0.9 % 100 mL IVPB  Status:  Discontinued        2 g 200 mL/hr over 30 Minutes Intravenous Every 12 hours 01/12/23 2209 01/13/23 0927   01/12/23 2145  vancomycin (VANCOCIN) IVPB 1000 mg/200 mL premix       See Hyperspace for full Linked Orders Report.   1,000 mg 200 mL/hr over 60 Minutes Intravenous  Once 01/12/23 2143 01/13/23 0100   01/12/23 2145  vancomycin (VANCOCIN) IVPB 1000 mg/200 mL premix       See Hyperspace for full Linked Orders Report.   1,000 mg 200 mL/hr over 60 Minutes Intravenous  Once 01/12/23 2143 01/13/23 0100   01/12/23 2045  ceFEPIme (MAXIPIME) 2 g in sodium chloride 0.9 % 100 mL IVPB        2 g 200 mL/hr over 30 Minutes Intravenous  Once 01/12/23 2039 01/12/23 2129   01/12/23 2045  metroNIDAZOLE (FLAGYL) IVPB 500 mg        500 mg 100 mL/hr over 60 Minutes Intravenous  Once 01/12/23 2039 01/12/23 2233   01/12/23  2045  vancomycin (VANCOCIN) IVPB 1000 mg/200 mL premix  Status:  Discontinued        1,000 mg 200 mL/hr over 60 Minutes Intravenous  Once 01/12/23 2039 01/12/23 2143        Objective: Vitals:   01/15/23 2154 01/16/23 0000 01/16/23 0431 01/16/23 0827  BP: 137/82 135/87 133/80 (!) 150/82  Pulse: (!) 118 (!) 104 (!) 101 (!) 109  Resp:  (!) 23 20   Temp:  98.2 F (36.8 C) 97.8 F (36.6 C)   TempSrc:  Oral Oral   SpO2:  97% 97%   Weight:   117.4 kg   Height:        Intake/Output Summary (Last 24 hours) at 01/16/2023 1133 Last data filed at 01/16/2023 1000 Gross per 24 hour  Intake 680 ml  Output 2301 ml  Net -1621 ml   Filed Weights   01/14/23 0358 01/15/23 0422 01/16/23 0431  Weight: 114.6 kg 115.5 kg 117.4 kg    Examination:  General exam: Appears calm and comfortable  Respiratory system: Clear to auscultation. Respiratory effort normal. No respiratory distress. No conversational dyspnea.  Cardiovascular system: S1 & S2 heard, RRR. No murmurs. No pedal edema. Gastrointestinal system: Abdomen is nondistended, soft and TTP only in the lower quadrants today (improved from yesterday). Normal bowel sounds heard. Central nervous system: Alert and oriented. No focal neurological deficits. Speech clear.  Extremities: Symmetric in appearance  Skin: No rashes, lesions or ulcers on exposed skin  Psychiatry: Judgement and insight appear normal. Mood & affect appropriate.   Data Reviewed: I have personally reviewed following labs and imaging studies  CBC: Recent Labs  Lab 01/12/23 2034 01/12/23 2128 01/14/23 0827 01/15/23 0122 01/16/23 0119  WBC 18.9*  --  23.1* 20.9* 15.9*  NEUTROABS 15.9*  --   --   --   --   HGB 12.9* 11.2* 10.7* 10.5* 11.1*  HCT 40.0 33.0* 33.8* 33.3* 35.2*  MCV 89.5  --  93.1 92.0 94.6  PLT 314  --  208 218 212   Basic Metabolic Panel: Recent Labs  Lab 01/12/23 2034 01/12/23 2128 01/13/23 1256 01/14/23 0827 01/15/23 0122  01/16/23 0119  NA 140  140  --  141 141 144  K 3.5 3.0*  --  3.8 4.0 3.7  CL 100  --   --  107 106 109  CO2 28  --   --  26 26 27   GLUCOSE 105*  --   --  99 114* 155*  BUN 37*  --   --  25* 25* 25*  CREATININE 1.98*  --   --  1.97* 1.83* 1.65*  CALCIUM 9.7  --   --  8.7* 8.8* 9.0  MG  --   --  1.4* 1.6* 2.0  --   PHOS  --   --  1.7* 4.2  --   --    GFR: Estimated Creatinine Clearance: 46.7 mL/min (A) (by C-G formula based on SCr of 1.65 mg/dL (H)).  Liver Function Tests: Recent Labs  Lab 01/12/23 2034  AST 18  ALT 26  ALKPHOS 56  BILITOT 0.8  PROT 7.3  ALBUMIN 2.9*   CBG: Recent Labs  Lab 01/15/23 1147 01/15/23 1553 01/15/23 2048 01/16/23 0610 01/16/23 1042  GLUCAP 125* 119* 157* 129* 114*   Sepsis Labs: Recent Labs  Lab 01/12/23 2034 01/12/23 2237  LATICACIDVEN 2.3* 1.3    Recent Results (from the past 240 hour(s))  Culture, blood (Routine x 2)     Status: Abnormal   Collection Time: 01/12/23  8:24 PM   Specimen: BLOOD LEFT ARM  Result Value Ref Range Status   Specimen Description   Final    BLOOD LEFT ARM Performed at [facility], [address], [place], [place] [address]    Special Requests   Final    BOTTLES DRAWN AEROBIC AND ANAEROBIC Blood Culture adequate volume Performed at [facility], [address], [place], [place] [address]    Culture  Setup Time   Final    GRAM NEGATIVE RODS AEROBIC BOTTLE ONLY CRITICAL RESULT CALLED TO, READ BACK BY AND VERIFIED WITH: Vivien Rossetti [id number] @1348  BY SM Performed at [facility], [address], [place], [place] [address]    Culture KLEBSIELLA PNEUMONIAE (A)  Final   Report Status 01/15/2023 FINAL  Final   Organism ID, Bacteria KLEBSIELLA PNEUMONIAE  Final      Susceptibility   Klebsiella pneumoniae - MIC*    AMPICILLIN >=32 RESISTANT Resistant     CEFEPIME <=0.12 SENSITIVE Sensitive     CEFTAZIDIME <=1 SENSITIVE Sensitive     CEFTRIAXONE <=0.25 SENSITIVE Sensitive     CIPROFLOXACIN  <=0.25 SENSITIVE Sensitive     GENTAMICIN <=1 SENSITIVE Sensitive     IMIPENEM <=0.25 SENSITIVE Sensitive     TRIMETH/SULFA <=20 SENSITIVE Sensitive     AMPICILLIN/SULBACTAM 8 SENSITIVE Sensitive     PIP/TAZO <=4 SENSITIVE Sensitive     * KLEBSIELLA PNEUMONIAE  Blood Culture ID Panel (Reflexed)     Status: Abnormal   Collection Time: 01/12/23  8:24 PM  Result Value Ref Range Status   Enterococcus faecalis NOT DETECTED NOT DETECTED Final   Enterococcus Faecium NOT DETECTED NOT DETECTED Final   Listeria monocytogenes NOT DETECTED NOT DETECTED Final   Staphylococcus species NOT DETECTED NOT DETECTED Final   Staphylococcus aureus (BCID) NOT DETECTED NOT DETECTED Final   Staphylococcus epidermidis NOT DETECTED NOT DETECTED Final   Staphylococcus lugdunensis NOT DETECTED NOT DETECTED Final   Streptococcus species NOT DETECTED NOT DETECTED Final   Streptococcus agalactiae NOT DETECTED NOT DETECTED Final   Streptococcus pneumoniae NOT DETECTED NOT  DETECTED Final   Streptococcus pyogenes NOT DETECTED NOT DETECTED Final   A.calcoaceticus-baumannii NOT DETECTED NOT DETECTED Final   Bacteroides fragilis NOT DETECTED NOT DETECTED Final   Enterobacterales DETECTED (A) NOT DETECTED Final    Comment: Enterobacterales represent a large order of gram negative bacteria, not a single organism. CRITICAL RESULT CALLED TO, READ BACK BY AND VERIFIED WITH: PHARMD ELIZABETH [id number] @1348  BY SM    Enterobacter cloacae complex NOT DETECTED NOT DETECTED Final   Escherichia coli NOT DETECTED NOT DETECTED Final   Klebsiella aerogenes NOT DETECTED NOT DETECTED Final   Klebsiella oxytoca NOT DETECTED NOT DETECTED Final   Klebsiella pneumoniae DETECTED (A) NOT DETECTED Final    Comment: CRITICAL RESULT CALLED TO, READ BACK BY AND VERIFIED WITH: PHARMD ELIZABETH [id number] @1348  BY SM    Proteus species NOT DETECTED NOT DETECTED Final   Salmonella species NOT DETECTED NOT DETECTED Final   Serratia marcescens NOT  DETECTED NOT DETECTED Final   Haemophilus influenzae NOT DETECTED NOT DETECTED Final   Neisseria meningitidis NOT DETECTED NOT DETECTED Final   Pseudomonas aeruginosa NOT DETECTED NOT DETECTED Final   Stenotrophomonas maltophilia NOT DETECTED NOT DETECTED Final   Candida albicans NOT DETECTED NOT DETECTED Final   Candida auris NOT DETECTED NOT DETECTED Final   Candida glabrata NOT DETECTED NOT DETECTED Final   Candida krusei NOT DETECTED NOT DETECTED Final   Candida parapsilosis NOT DETECTED NOT DETECTED Final   Candida tropicalis NOT DETECTED NOT DETECTED Final   Cryptococcus neoformans/gattii NOT DETECTED NOT DETECTED Final   CTX-M ESBL NOT DETECTED NOT DETECTED Final   Carbapenem resistance IMP NOT DETECTED NOT DETECTED Final   Carbapenem resistance KPC NOT DETECTED NOT DETECTED Final   Carbapenem resistance NDM NOT DETECTED NOT DETECTED Final   Carbapenem resist OXA 48 LIKE NOT DETECTED NOT DETECTED Final   Carbapenem resistance VIM NOT DETECTED NOT DETECTED Final    Comment: Performed at [facility], [address] [place], [place] [address]  Culture, blood (Routine x 2)     Status: Abnormal   Collection Time: 01/12/23  8:26 PM   Specimen: BLOOD RIGHT FOREARM  Result Value Ref Range Status   Specimen Description   Final    BLOOD RIGHT FOREARM Performed at [facility], [address] [place], [place] [address]    Special Requests   Final    BOTTLES DRAWN AEROBIC AND ANAEROBIC Blood Culture results may not be optimal due to an excessive volume of blood received in culture bottles Performed at [facility], [address] [place], [place] [address]    Culture  Setup Time   Final    GRAM NEGATIVE RODS IN BOTH AEROBIC AND ANAEROBIC BOTTLES CRITICAL VALUE NOTED.  VALUE IS CONSISTENT WITH PREVIOUSLY REPORTED AND CALLED VALUE.    Culture (A)  Final    KLEBSIELLA PNEUMONIAE SUSCEPTIBILITIES PERFORMED ON PREVIOUS CULTURE WITHIN THE LAST 5  DAYS. Performed at [facility], [address], [place], [place] [address]    Report Status 01/15/2023 FINAL  Final  Resp panel by RT-PCR (RSV, Flu A&B, Covid) Anterior Nasal Swab     Status: None   Collection Time: 01/12/23  8:39 PM   Specimen: Anterior Nasal Swab  Result Value Ref Range Status   SARS Coronavirus 2 by RT PCR NEGATIVE NEGATIVE Final    Comment: (NOTE) SARS-CoV-2 target nucleic acids are NOT DETECTED.  The SARS-CoV-2 RNA is generally detectable in upper respiratory specimens during the  acute phase of infection. The lowest concentration of SARS-CoV-2 viral copies this assay can detect is 138 copies/mL. A negative result does not preclude SARS-Cov-2 infection and should not be used as the sole basis for treatment or other patient management decisions. A negative result may occur with  improper specimen collection/handling, submission of specimen other than nasopharyngeal swab, presence of viral mutation(s) within the areas targeted by this assay, and inadequate number of viral copies(<138 copies/mL). A negative result must be combined with clinical observations, patient history, and epidemiological information. The expected result is Negative.  Fact Sheet for Patients:  BloggerCourse.com  Fact Sheet for Healthcare Providers:  SeriousBroker.it  This test is no t yet approved or cleared by the [place] FDA and  has been authorized for detection and/or diagnosis of SARS-CoV-2 by FDA under an Emergency Use Authorization (EUA). This EUA will remain  in effect (meaning this test can be used) for the duration of the COVID-19 declaration under Section 564(b)(1) of the Act, 21 U.S.C.section 360bbb-3(b)(1), unless the authorization is terminated  or revoked sooner.       Influenza A by PCR NEGATIVE NEGATIVE Final   Influenza B by PCR NEGATIVE NEGATIVE Final    Comment: (NOTE) The Xpert Xpress  SARS-CoV-2/FLU/RSV plus assay is intended as an aid in the diagnosis of influenza from Nasopharyngeal swab specimens and should not be used as a sole basis for treatment. Nasal washings and aspirates are unacceptable for Xpert Xpress SARS-CoV-2/FLU/RSV testing.  Fact Sheet for Patients: BloggerCourse.com  Fact Sheet for Healthcare Providers: SeriousBroker.it  This test is not yet approved or cleared by the [place] FDA and has been authorized for detection and/or diagnosis of SARS-CoV-2 by FDA under an Emergency Use Authorization (EUA). This EUA will remain in effect (meaning this test can be used) for the duration of the COVID-19 declaration under Section 564(b)(1) of the Act, 21 U.S.C. section 360bbb-3(b)(1), unless the authorization is terminated or revoked.     Resp Syncytial Virus by PCR NEGATIVE NEGATIVE Final    Comment: (NOTE) Fact Sheet for Patients: BloggerCourse.com  Fact Sheet for Healthcare Providers: SeriousBroker.it  This test is not yet approved or cleared by the [place] FDA and has been authorized for detection and/or diagnosis of SARS-CoV-2 by FDA under an Emergency Use Authorization (EUA). This EUA will remain in effect (meaning this test can be used) for the duration of the COVID-19 declaration under Section 564(b)(1) of the Act, 21 U.S.C. section 360bbb-3(b)(1), unless the authorization is terminated or revoked.  Performed at [facility], [address] [place], [place] [address]   Urine Culture     Status: Abnormal   Collection Time: 01/12/23 10:37 PM   Specimen: Urine, Random  Result Value Ref Range Status   Specimen Description   Final    URINE, RANDOM Performed at [facility], [address], [place], [place] [address]    Special Requests   Final    NONE Reflexed from [id number] Performed at [facility], [address] [place], [place] [address]    Culture 20,000 COLONIES/mL KLEBSIELLA PNEUMONIAE (A)  Final   Report Status 01/15/2023 FINAL  Final   Organism ID, Bacteria KLEBSIELLA PNEUMONIAE (A)  Final      Susceptibility   Klebsiella pneumoniae - MIC*    AMPICILLIN >=32 RESISTANT Resistant     CEFAZOLIN <=4 SENSITIVE Sensitive     CEFEPIME <=0.12 SENSITIVE Sensitive     CEFTRIAXONE <=0.25 SENSITIVE Sensitive  CIPROFLOXACIN <=0.25 SENSITIVE Sensitive     GENTAMICIN <=1 SENSITIVE Sensitive     IMIPENEM <=0.25 SENSITIVE Sensitive     NITROFURANTOIN 64 INTERMEDIATE Intermediate     TRIMETH/SULFA <=20 SENSITIVE Sensitive     AMPICILLIN/SULBACTAM 4 SENSITIVE Sensitive     PIP/TAZO <=4 SENSITIVE Sensitive     * 20,000 COLONIES/mL KLEBSIELLA PNEUMONIAE  Culture, blood (Routine X 2) w Reflex to ID Panel     Status: None (Preliminary result)   Collection Time: 01/14/23  8:28 AM   Specimen: BLOOD  Result Value Ref Range Status   Specimen Description BLOOD LEFT ANTECUBITAL  Final   Special Requests   Final    BOTTLES DRAWN AEROBIC ONLY Blood Culture results may not be optimal due to an inadequate volume of blood received in culture bottles   Culture   Final    NO GROWTH 2 DAYS Performed at [facility], [address], [place], [place] [address]    Report Status PENDING  Incomplete  Culture, blood (Routine X 2) w Reflex to ID Panel     Status: None (Preliminary result)   Collection Time: 01/14/23  8:28 AM   Specimen: BLOOD LEFT HAND  Result Value Ref Range Status   Specimen Description BLOOD LEFT HAND  Final   Special Requests   Final    BOTTLES DRAWN AEROBIC ONLY Blood Culture results may not be optimal due to an inadequate volume of blood received in culture bottles   Culture   Final    NO GROWTH 2 DAYS Performed at [facility], [address], [place], [place] [address]    Report Status PENDING  Incomplete     Scheduled Meds:   allopurinol  100 mg Oral Daily   apixaban  5 mg Oral BID   atorvastatin  20 mg Oral Daily   Chlorhexidine Gluconate Cloth  6 each Topical Daily   insulin aspart  0-9 Units Subcutaneous TID WC   metoprolol tartrate  25 mg Oral BID   senna-docusate  1 tablet Oral BID   tamsulosin  0.4 mg Oral QPC supper   Continuous Infusions:  sodium chloride 100 mL/hr at 01/15/23 2057   cefTRIAXone (ROCEPHIN)  IV       LOS: 3 days    Time spent: 25 minutes   Sharlene Dory, DO [facility] 01/16/2023, 11:33 AM   Available via Epic secure chat 7am-7pm After these hours, please refer to coverage provider listed on [email or web address]

## 2023-01-17 DIAGNOSIS — N390 Urinary tract infection, site not specified: Secondary | ICD-10-CM | POA: Diagnosis not present

## 2023-01-17 LAB — CBC
HCT: 33.3 % — ABNORMAL LOW (ref 39.0–52.0)
Hemoglobin: 10.4 g/dL — ABNORMAL LOW (ref 13.0–17.0)
MCH: 29 pg (ref 26.0–34.0)
MCHC: 31.2 g/dL (ref 30.0–36.0)
MCV: 92.8 fL (ref 80.0–100.0)
Platelets: 253 10*3/uL (ref 150–400)
RBC: 3.59 MIL/uL — ABNORMAL LOW (ref 4.22–5.81)
RDW: 16.8 % — ABNORMAL HIGH (ref 11.5–15.5)
WBC: 14.1 10*3/uL — ABNORMAL HIGH (ref 4.0–10.5)
nRBC: 0 % (ref 0.0–0.2)

## 2023-01-17 LAB — GLUCOSE, CAPILLARY
Glucose-Capillary: 96 mg/dL (ref 70–99)
Glucose-Capillary: 102 mg/dL — ABNORMAL HIGH (ref 70–99)
Glucose-Capillary: 111 mg/dL — ABNORMAL HIGH (ref 70–99)
Glucose-Capillary: 116 mg/dL — ABNORMAL HIGH (ref 70–99)

## 2023-01-17 LAB — BASIC METABOLIC PANEL
Anion gap: 9 (ref 5–15)
BUN: 22 mg/dL (ref 8–23)
CO2: 26 mmol/L (ref 22–32)
Calcium: 8.9 mg/dL (ref 8.9–10.3)
Chloride: 109 mmol/L (ref 98–111)
Creatinine, Ser: 1.63 mg/dL — ABNORMAL HIGH (ref 0.61–1.24)
GFR, Estimated: 43 mL/min — ABNORMAL LOW (ref >60)
Glucose, Bld: 112 mg/dL — ABNORMAL HIGH (ref 70–99)
Potassium: 3.7 mmol/L (ref 3.5–5.1)
Sodium: 144 mmol/L (ref 135–145)

## 2023-01-17 MED ORDER — CIPROFLOXACIN HCL 750 MG PO TABS
750.0000 mg | ORAL_TABLET | Freq: Two times a day (BID) | ORAL | Status: DC
  Administered 2023-01-17 – 2023-01-19 (×5): 750 mg via ORAL
  Filled 2023-01-17 (×6): qty 1

## 2023-01-17 MED ORDER — SACCHAROMYCES BOULARDII 250 MG PO CAPS
250.0000 mg | ORAL_CAPSULE | Freq: Two times a day (BID) | ORAL | Status: DC
  Administered 2023-01-17 – 2023-01-19 (×5): 250 mg via ORAL
  Filled 2023-01-17 (×5): qty 1

## 2023-01-17 NOTE — Progress Notes (Signed)
Occupational Therapy Treatment Patient Details Name: [name] [name] MRN: [id number] DOB: [birth year] Today's Date: 01/17/2023   History of present illness Pt is 77 year old presented to [facility] on  01/12/23 for weakness and fever. Pt with severe sepsis due to UTI.  PMH - T12-L1 fx with ORIF 11/23/22, morbid obesity, OSA, OHS, DM2, HTN, HLD, COPD, severe pulmonary hypertension, chronic diastolic and right-sided CHF, chronic 3 to 5 L oxygen requirement, permanent A-fib on Eliquis, migraine, arthritis, gout   OT comments  Patient continues to demonstrate progress with OT/PT treatment. Patient continues to require Platon assist of 2 for bed mobility but able to stand and perform transfers with min assist +2. Patient now on 2 liters of O2 with SpO2 in upper 90's during visit. Patient will benefit from continued inpatient follow up therapy, <3 hours/day to address self care and functional transfers. Acute OT to continue to follow.    Recommendations for follow up therapy are one component of a multi-disciplinary discharge planning process, led by the attending physician.  Recommendations may be updated based on patient status, additional functional criteria and insurance authorization.    Assistance Recommended at Discharge Frequent or constant Supervision/Assistance  Patient can return home with the following  A little help with walking and/or transfers;A lot of help with bathing/dressing/bathroom;Assist for transportation;Assistance with cooking/housework;Help with stairs or ramp for entrance   Equipment Recommendations  Tub/shower bench    Recommendations for Other Services      Precautions / Restrictions Precautions Precautions: Fall;Back Required Braces or Orthoses: Spinal Brace Spinal Brace: Applied in standing position (clam shell brace) Restrictions Weight Bearing Restrictions: No       Mobility Bed Mobility Overal bed mobility: Needs Assistance Bed Mobility: Rolling, Sidelying to Sit Rolling:  [name] [name] assist, +2 for physical assistance Sidelying to sit: +2 for physical assistance, [name] [name] assist       General bed mobility comments: requires assistance to roll and unable to scoot towards EOB    Transfers Overall transfer level: Needs assistance Equipment used: Rolling walker (2 wheels) Transfers: Sit to/from Stand, Bed to chair/wheelchair/BSC Sit to Stand: Min assist, From elevated surface     Step pivot transfers: +2 physical assistance, Min assist     General transfer comment: requires less assistance for transfers than bed mobility with increase in HR     Balance Overall balance assessment: Needs assistance Sitting-balance support: Feet supported, No upper extremity supported Sitting balance-[name] [name] Scale: Fair Sitting balance - Comments: requires increaeed time   Standing balance support: Bilateral upper extremity supported, During functional activity, Reliant on assistive device for balance Standing balance-[name] [name] Scale: Poor Standing balance comment: Reliant on RW                           ADL either performed or assessed with clinical judgement   ADL Overall ADL's : Needs assistance/impaired     Grooming: Wash/dry hands;Wash/dry face;Supervision/safety;Sitting                   Toilet Transfer: Minimal assistance;+2 for physical assistance;Stand-pivot;Rolling walker (2 wheels);BSC/3in1   Toileting- Clothing Manipulation and Hygiene: Sitting/lateral lean;Maximal assistance Toileting - Clothing Manipulation Details (indicate cue type and reason): [name] [name] A for rear pericare       General ADL Comments: limited by pain and back precautions    Extremity/Trunk Assessment              Vision       Perception  Praxis      Cognition Arousal/Alertness: Awake/alert Behavior During Therapy: WFL for tasks assessed/performed Overall Cognitive Status: Within Functional Limits for tasks assessed                                           Exercises Exercises: General Upper Extremity    Shoulder Instructions       General Comments at end of session HR 125, SPo2 95 on 2 liters    Pertinent Vitals/ Pain       Pain Assessment Pain Assessment: Faces Faces Pain Scale: Hurts even more Pain Location: Per pt report, all over Pain Descriptors / Indicators: Grimacing, Guarding, Moaning Pain Intervention(s): Limited activity within patient's tolerance, Monitored during session, Repositioned  Home Living                                          Prior Functioning/Environment              Frequency  Min 2X/week        Progress Toward Goals  OT Goals(current goals can now be found in the care plan section)  Progress towards OT goals: Progressing toward goals  Acute Rehab OT Goals Patient Stated Goal: get better OT Goal Formulation: With patient/family Time For Goal Achievement: 01/29/23 Potential to Achieve Goals: Good ADL Goals Pt Will Perform Grooming: standing;with min guard assist Pt Will Perform Lower Body Dressing: sitting/lateral leans;with adaptive equipment;with supervision;with set-up Pt Will Transfer to Toilet: ambulating;with min guard assist;bedside commode Pt Will Perform Tub/Shower Transfer: ambulating;tub bench;rolling walker;with min guard assist  Plan Discharge plan remains appropriate    Co-evaluation    PT/OT/SLP Co-Evaluation/Treatment: Yes Reason for Co-Treatment: Complexity of the patient's impairments (multi-system involvement);For patient/therapist safety;To address functional/ADL transfers PT goals addressed during session: Mobility/safety with mobility;Proper use of DME OT goals addressed during session: ADL's and self-care      AM-PAC OT "6 Clicks" Daily Activity     Outcome Measure   Help from another person eating meals?: None Help from another person taking care of personal grooming?: A Little Help from another person toileting, which  includes using toliet, bedpan, or urinal?: A Little Help from another person bathing (including washing, rinsing, drying)?: A Lot Help from another person to put on and taking off regular upper body clothing?: A Little Help from another person to put on and taking off regular lower body clothing?: Total 6 Click Score: 16    End of Session Equipment Utilized During Treatment: Gait belt;Rolling walker (2 wheels)  OT Visit Diagnosis: Unsteadiness on feet (R26.81);Pain;Muscle weakness (generalized) (M62.81) Pain - part of body:  (back/all over)   Activity Tolerance Patient tolerated treatment well   Patient Left in chair;with call bell/phone within reach;with family/visitor present   Nurse Communication Mobility status        Time: 1006-1036 OT Time Calculation (min): 30 min  Charges: OT General Charges $OT Visit: 1 Visit OT Treatments $Self Care/Home Management : 8-22 mins  Alfonse Flavors, OTA Acute Rehabilitation Services  Office [phone]   Dewain Penning 01/17/2023, 11:53 AM

## 2023-01-17 NOTE — Progress Notes (Signed)
PROGRESS NOTE [name] [name]  ZOX:[id number] DOB: 1946-07-16 DOA: 01/12/2023 PCP: Corwin Levins, MD  Brief Narrative/Hospital Course:  77 year old male with history of paroxysmal A-fib on Eliquis, chronic diastolic CHF, COPD, chronic hypoxic respiratory failure on 4 L of oxygen at home, diabetes type 2 who presented from home with complaint of fever, chills, dysuria, lower abdominal pain.  He was just discharged from rehab to home. He lives with his daughter. On presentation, he was febrile, rapid A-fib, blood pressure was low.  UA was suspicious for UTI.  CT abdomen /pelvis showed left kidney stranding, mild hydronephrosis without obstruction.  Lab work showed leukocytosis.  Patient was admitted for the management of severe sepsis secondary to UTI.  Started on broad spectrum antibiotics and treated for sepsis due to complicated UTI.  Urine and blood cultures showing Klebsiella.  Patient was transitioned to oral antibiotics 6/10 based on sensitivity to Unasyn. Other medical issues including chronic diastolic CHF hyperlipidemia A-fib BPH gout stable. Otherwise stable and pending placement     Subjective: Seen and examined Resting well Daughter at bedside   Assessment and Plan: Principal Problem:   Complicated UTI (urinary tract infection) Active Problems:   Hyperlipidemia   Urinary frequency   Chronic diastolic CHF (congestive heart failure) (HCC)   Chronic a-fib (HCC)   Sepsis (HCC)   Bacteremia   Sepsis with Klebsiella bacteremia Complicated UTI: Treated with ceftriaxone> transitioned to Augmentin 6/10 but given Klebsiella sensitive pattern w/ MIC for unasyn -changing to ciprofloxacin per pharmacy. 2nd set of BCx's w NGTD.  Leukocytosis at 14.1 downtrending from 20.9 Recent Labs  Lab 01/12/23 2034 01/12/23 2237 01/14/23 0827 01/15/23 0122 01/16/23 0119 01/17/23 0117  WBC 18.9*  --  23.1* 20.9* 15.9* 14.1*  LATICACIDVEN 2.3* 1.3  --   --   --   --    BPH on Flomax Urine retention w/  WJX:BJYNWGNF creatinine ~1.2-1.4:On admission 1.9 downtrending with IV fluid hydration. Will do voiding trial today and remove foley.  Chronic diastolic AOZ:HYQMVHQIO 25 mg bid.Needing IVF for AKI.We will wean ivf.Taking po.  Hyperlipidemia: cont Lipitor Chronic a-fib : On Eliquis and Lopressor  Gout on allopurinol Anemia appears chronic in 10 to 11 g range.  Monitor Fecal incontinence/Loose stool ongoing. Monitor. Diet as tolerated. Add Insurance underwriter.  Class II Obesity w/ NGE:[id number]'K Body mass index is 39.62 kg/m. : Will benefit with PCP follow-up, weight loss  healthy lifestyle. Ordered home CPAP as he takes one at home- daughter will try to bring home machine   DVT prophylaxis: Eliquis Code Status:   Code Status: Full Code Family Communication: plan of care discussed with patient and his daughter at bedside. Patient status is:  INPATIENT  because of SEPSIS Level of care: Progressive   Dispo: The patient is from: home w/ daughter            Anticipated disposition: SNF pending ,in 1 day.DOING voiding trial today  Objective: Vitals last 24 hrs: Vitals:   01/16/23 2000 01/16/23 2334 01/17/23 0400 01/17/23 0831  BP: (!) 151/94 (!) 140/84 (!) 144/82 (!) 141/91  Pulse: (!) 115 (!) 109 (!) 103 (!) 111  Resp: 20 20 20 20   Temp: 98.6 F (37 C) 98.1 F (36.7 C) 98.3 F (36.8 C) 98.5 F (36.9 C)  TempSrc: Oral Oral Oral Oral  SpO2: 97% 94% 92% 93%  Weight:   118.2 kg   Height:       Weight change: 0.8 kg  Physical Examination: General exam: alert awake, older  than stated age HEENT:Oral mucosa moist, Ear/Nose WNL grossly Respiratory system: bilaterally diminished BS, no use of accessory muscle Cardiovascular system: S1 & S2 +, No JVD. Gastrointestinal system: Abdomen soft,NT,ND, BS+ Nervous System:Alert, awake, moving extremities. Extremities: LE edema neg w/ hyperpigmented and hyperkeratotic legs,distal peripheral pulses palpable.  Skin: No rashes,no icterus. MSK: Normal  muscle bulk,tone, power  Medications reviewed:  Scheduled Meds:  allopurinol  100 mg Oral Daily   apixaban  5 mg Oral BID   atorvastatin  20 mg Oral Daily   Chlorhexidine Gluconate Cloth  6 each Topical Daily   ciprofloxacin  750 mg Oral BID   insulin aspart  0-9 Units Subcutaneous TID WC   metoprolol tartrate  25 mg Oral BID   saccharomyces boulardii  250 mg Oral BID   senna-docusate  1 tablet Oral BID   tamsulosin  0.4 mg Oral QPC supper   Continuous Infusions:  sodium chloride 50 mL/hr at 01/17/23 1045   Diet Order             Diet heart healthy/carb modified Room service appropriate? Yes; Fluid consistency: Thin  Diet effective now                  Intake/Output Summary (Last 24 hours) at 01/17/2023 1144 Last data filed at 01/17/2023 0436 Gross per 24 hour  Intake 250 ml  Output 1800 ml  Net -1550 ml   Net IO Since Admission: -6,799.91 mL [01/17/23 1144]  Wt Readings from Last 3 Encounters:  01/17/23 118.2 kg  11/18/22 124.7 kg  10/08/22 124.7 kg     Unresulted Labs (From admission, onward)     Start     Ordered   01/16/23 0500  Basic metabolic panel  Daily,   R     Question:  Specimen collection method  Answer:  Unit=Unit collect   01/15/23 1355   01/16/23 0500  CBC  Daily,   R     Question:  Specimen collection method  Answer:  Unit=Unit collect   01/15/23 1355          Data Reviewed: I have personally reviewed following labs and imaging studies CBC: Recent Labs  Lab 01/12/23 2034 01/12/23 2128 01/14/23 0827 01/15/23 0122 01/16/23 0119 01/17/23 0117  WBC 18.9*  --  23.1* 20.9* 15.9* 14.1*  NEUTROABS 15.9*  --   --   --   --   --   HGB 12.9* 11.2* 10.7* 10.5* 11.1* 10.4*  HCT 40.0 33.0* 33.8* 33.3* 35.2* 33.3*  MCV 89.5  --  93.1 92.0 94.6 92.8  PLT 314  --  208 218 212 253   Basic Metabolic Panel: Recent Labs  Lab 01/12/23 2034 01/12/23 2128 01/13/23 1256 01/14/23 0827 01/15/23 0122 01/16/23 0119 01/17/23 0117  NA 140 140  --   141 141 144 144  K 3.5 3.0*  --  3.8 4.0 3.7 3.7  CL 100  --   --  107 106 109 109  CO2 28  --   --  26 26 27 26   GLUCOSE 105*  --   --  99 114* 155* 112*  BUN 37*  --   --  25* 25* 25* 22  CREATININE 1.98*  --   --  1.97* 1.83* 1.65* 1.63*  CALCIUM 9.7  --   --  8.7* 8.8* 9.0 8.9  MG  --   --  1.4* 1.6* 2.0  --   --   PHOS  --   --  1.7* 4.2  --   --   --    GFR: Estimated Creatinine Clearance: 47.4 mL/min (A) (by C-G formula based on SCr of 1.63 mg/dL (H)). Liver Function Tests: Recent Labs  Lab 01/12/23 2034  AST 18  ALT 26  ALKPHOS 56  BILITOT 0.8  PROT 7.3  ALBUMIN 2.9*   Recent Labs  Lab 01/12/23 2034  INR 1.5*   CBG: Recent Labs  Lab 01/16/23 0610 01/16/23 1042 01/16/23 1559 01/17/23 0612 01/17/23 1136  GLUCAP 129* 114* 129* 96 102*   Recent Labs  Lab 01/12/23 2034 01/12/23 2237  LATICACIDVEN 2.3* 1.3    Recent Results (from the past 240 hour(s))  Culture, blood (Routine x 2)     Status: Abnormal   Collection Time: 01/12/23  8:24 PM   Specimen: BLOOD LEFT ARM  Result Value Ref Range Status   Specimen Description   Final    BLOOD LEFT ARM Performed at [facility], [address], [place], [place] [address]    Special Requests   Final    BOTTLES DRAWN AEROBIC AND ANAEROBIC Blood Culture adequate volume Performed at [facility], [address], [place], [place] [address]    Culture  Setup Time   Final    GRAM NEGATIVE RODS AEROBIC BOTTLE ONLY CRITICAL RESULT CALLED TO, READ BACK BY AND VERIFIED WITH: Vivien Rossetti [id number] @1348  BY SM Performed at [facility], [address], [place], [place] [address]    Culture KLEBSIELLA PNEUMONIAE (A)  Final   Report Status 01/15/2023 FINAL  Final   Organism ID, Bacteria KLEBSIELLA PNEUMONIAE  Final      Susceptibility   Klebsiella pneumoniae - MIC*    AMPICILLIN >=32 RESISTANT Resistant     CEFEPIME <=0.12 SENSITIVE Sensitive     CEFTAZIDIME <=1 SENSITIVE Sensitive      CEFTRIAXONE <=0.25 SENSITIVE Sensitive     CIPROFLOXACIN <=0.25 SENSITIVE Sensitive     GENTAMICIN <=1 SENSITIVE Sensitive     IMIPENEM <=0.25 SENSITIVE Sensitive     TRIMETH/SULFA <=20 SENSITIVE Sensitive     AMPICILLIN/SULBACTAM 8 SENSITIVE Sensitive     PIP/TAZO <=4 SENSITIVE Sensitive     * KLEBSIELLA PNEUMONIAE  Blood Culture ID Panel (Reflexed)     Status: Abnormal   Collection Time: 01/12/23  8:24 PM  Result Value Ref Range Status   Enterococcus faecalis NOT DETECTED NOT DETECTED Final   Enterococcus Faecium NOT DETECTED NOT DETECTED Final   Listeria monocytogenes NOT DETECTED NOT DETECTED Final   Staphylococcus species NOT DETECTED NOT DETECTED Final   Staphylococcus aureus (BCID) NOT DETECTED NOT DETECTED Final   Staphylococcus epidermidis NOT DETECTED NOT DETECTED Final   Staphylococcus lugdunensis NOT DETECTED NOT DETECTED Final   Streptococcus species NOT DETECTED NOT DETECTED Final   Streptococcus agalactiae NOT DETECTED NOT DETECTED Final   Streptococcus pneumoniae NOT DETECTED NOT DETECTED Final   Streptococcus pyogenes NOT DETECTED NOT DETECTED Final   A.calcoaceticus-baumannii NOT DETECTED NOT DETECTED Final   Bacteroides fragilis NOT DETECTED NOT DETECTED Final   Enterobacterales DETECTED (A) NOT DETECTED Final    Comment: Enterobacterales represent a large order of gram negative bacteria, not a single organism. CRITICAL RESULT CALLED TO, READ BACK BY AND VERIFIED WITH: PHARMD ELIZABETH [id number] @1348  BY SM    Enterobacter cloacae complex NOT DETECTED NOT DETECTED Final   Escherichia coli NOT DETECTED NOT DETECTED Final   Klebsiella aerogenes NOT DETECTED NOT DETECTED Final   Klebsiella oxytoca NOT DETECTED NOT DETECTED  Final   Klebsiella pneumoniae DETECTED (A) NOT DETECTED Final    Comment: CRITICAL RESULT CALLED TO, READ BACK BY AND VERIFIED WITH: PHARMD ELIZABETH [id number] @1348  BY SM    Proteus species NOT DETECTED NOT DETECTED Final   Salmonella species  NOT DETECTED NOT DETECTED Final   Serratia marcescens NOT DETECTED NOT DETECTED Final   Haemophilus influenzae NOT DETECTED NOT DETECTED Final   Neisseria meningitidis NOT DETECTED NOT DETECTED Final   Pseudomonas aeruginosa NOT DETECTED NOT DETECTED Final   Stenotrophomonas maltophilia NOT DETECTED NOT DETECTED Final   Candida albicans NOT DETECTED NOT DETECTED Final   Candida auris NOT DETECTED NOT DETECTED Final   Candida glabrata NOT DETECTED NOT DETECTED Final   Candida krusei NOT DETECTED NOT DETECTED Final   Candida parapsilosis NOT DETECTED NOT DETECTED Final   Candida tropicalis NOT DETECTED NOT DETECTED Final   Cryptococcus neoformans/gattii NOT DETECTED NOT DETECTED Final   CTX-M ESBL NOT DETECTED NOT DETECTED Final   Carbapenem resistance IMP NOT DETECTED NOT DETECTED Final   Carbapenem resistance KPC NOT DETECTED NOT DETECTED Final   Carbapenem resistance NDM NOT DETECTED NOT DETECTED Final   Carbapenem resist OXA 48 LIKE NOT DETECTED NOT DETECTED Final   Carbapenem resistance VIM NOT DETECTED NOT DETECTED Final    Comment: Performed at [facility], [address], [place], [place] [address]  Culture, blood (Routine x 2)     Status: Abnormal   Collection Time: 01/12/23  8:26 PM   Specimen: BLOOD RIGHT FOREARM  Result Value Ref Range Status   Specimen Description   Final    BLOOD RIGHT FOREARM Performed at [facility], [address], [place], [place] [address]    Special Requests   Final    BOTTLES DRAWN AEROBIC AND ANAEROBIC Blood Culture results may not be optimal due to an excessive volume of blood received in culture bottles Performed at [facility], [address], [place], [place] [address]    Culture  Setup Time   Final    GRAM NEGATIVE RODS IN BOTH AEROBIC AND ANAEROBIC BOTTLES CRITICAL VALUE NOTED.  VALUE IS CONSISTENT WITH PREVIOUSLY REPORTED AND CALLED VALUE.    Culture (A)  Final    KLEBSIELLA  PNEUMONIAE SUSCEPTIBILITIES PERFORMED ON PREVIOUS CULTURE WITHIN THE LAST 5 DAYS. Performed at [facility], [address], [place], [place] [address]    Report Status 01/15/2023 FINAL  Final  Resp panel by RT-PCR (RSV, Flu A&B, Covid) Anterior Nasal Swab     Status: None   Collection Time: 01/12/23  8:39 PM   Specimen: Anterior Nasal Swab  Result Value Ref Range Status   SARS Coronavirus 2 by RT PCR NEGATIVE NEGATIVE Final    Comment: (NOTE) SARS-CoV-2 target nucleic acids are NOT DETECTED.  The SARS-CoV-2 RNA is generally detectable in upper respiratory specimens during the acute phase of infection. The lowest concentration of SARS-CoV-2 viral copies this assay can detect is 138 copies/mL. A negative result does not preclude SARS-Cov-2 infection and should not be used as the sole basis for treatment or other patient management decisions. A negative result may occur with  improper specimen collection/handling, submission of specimen other than nasopharyngeal swab, presence of viral mutation(s) within the areas targeted by this assay, and inadequate number of viral copies(<138 copies/mL). A negative result must be combined with clinical observations, patient history, and epidemiological information. The expected result is Negative.  Fact Sheet for Patients:  BloggerCourse.com  Fact Sheet  for Healthcare Providers:  SeriousBroker.it  This test is no t yet approved or cleared by the [place] and  has been authorized for detection and/or diagnosis of SARS-CoV-2 by FDA under an Emergency Use Authorization (EUA). This EUA will remain  in effect (meaning this test can be used) for the duration of the COVID-19 declaration under Section 564(b)(1) of the Act, 21 U.S.C.section 360bbb-3(b)(1), unless the authorization is terminated  or revoked sooner.       Influenza A by PCR NEGATIVE NEGATIVE Final   Influenza B by PCR  NEGATIVE NEGATIVE Final    Comment: (NOTE) The Xpert Xpress SARS-CoV-2/FLU/RSV plus assay is intended as an aid in the diagnosis of influenza from Nasopharyngeal swab specimens and should not be used as a sole basis for treatment. Nasal washings and aspirates are unacceptable for Xpert Xpress SARS-CoV-2/FLU/RSV testing.  Fact Sheet for Patients: BloggerCourse.com  Fact Sheet for Healthcare Providers: SeriousBroker.it  This test is not yet approved or cleared by the [place] FDA and has been authorized for detection and/or diagnosis of SARS-CoV-2 by FDA under an Emergency Use Authorization (EUA). This EUA will remain in effect (meaning this test can be used) for the duration of the COVID-19 declaration under Section 564(b)(1) of the Act, 21 U.S.C. section 360bbb-3(b)(1), unless the authorization is terminated or revoked.     Resp Syncytial Virus by PCR NEGATIVE NEGATIVE Final    Comment: (NOTE) Fact Sheet for Patients: BloggerCourse.com  Fact Sheet for Healthcare Providers: SeriousBroker.it  This test is not yet approved or cleared by the [place] FDA and has been authorized for detection and/or diagnosis of SARS-CoV-2 by FDA under an Emergency Use Authorization (EUA). This EUA will remain in effect (meaning this test can be used) for the duration of the COVID-19 declaration under Section 564(b)(1) of the Act, 21 U.S.C. section 360bbb-3(b)(1), unless the authorization is terminated or revoked.  Performed at [facility], [address] [place], [place] [address]   Urine Culture     Status: Abnormal   Collection Time: 01/12/23 10:37 PM   Specimen: Urine, Random  Result Value Ref Range Status   Specimen Description   Final    URINE, RANDOM Performed at [facility], [address] [place], [place] [address]    Special  Requests   Final    NONE Reflexed from [id number] Performed at [facility], [address] [place], [place] [address]    Culture 20,000 COLONIES/mL KLEBSIELLA PNEUMONIAE (A)  Final   Report Status 01/15/2023 FINAL  Final   Organism ID, Bacteria KLEBSIELLA PNEUMONIAE (A)  Final      Susceptibility   Klebsiella pneumoniae - MIC*    AMPICILLIN >=32 RESISTANT Resistant     CEFAZOLIN <=4 SENSITIVE Sensitive     CEFEPIME <=0.12 SENSITIVE Sensitive     CEFTRIAXONE <=0.25 SENSITIVE Sensitive     CIPROFLOXACIN <=0.25 SENSITIVE Sensitive     GENTAMICIN <=1 SENSITIVE Sensitive     IMIPENEM <=0.25 SENSITIVE Sensitive     NITROFURANTOIN 64 INTERMEDIATE Intermediate     TRIMETH/SULFA <=20 SENSITIVE Sensitive     AMPICILLIN/SULBACTAM 4 SENSITIVE Sensitive     PIP/TAZO <=4 SENSITIVE Sensitive     * 20,000 COLONIES/mL KLEBSIELLA PNEUMONIAE  Culture, blood (Routine X 2) w Reflex to ID Panel     Status: None (Preliminary result)   Collection Time: 01/14/23  8:28 AM   Specimen: BLOOD  Result Value Ref Range Status   Specimen Description BLOOD LEFT ANTECUBITAL  Final   Special Requests   Final    BOTTLES DRAWN AEROBIC ONLY Blood Culture results may not be optimal due to an inadequate volume of blood received in culture bottles   Culture   Final    NO GROWTH 3 DAYS Performed at [facility], [address] [place], [place] [address]    Report Status PENDING  Incomplete  Culture, blood (Routine X 2) w Reflex to ID Panel     Status: None (Preliminary result)   Collection Time: 01/14/23  8:28 AM   Specimen: BLOOD LEFT HAND  Result Value Ref Range Status   Specimen Description BLOOD LEFT HAND  Final   Special Requests   Final    BOTTLES DRAWN AEROBIC ONLY Blood Culture results may not be optimal due to an inadequate volume of blood received in culture bottles   Culture   Final    NO GROWTH 3 DAYS Performed at [facility], [address] [place], [place] [address]     Report Status PENDING  Incomplete    Antimicrobials: Anti-infectives (From admission, onward)    Start     Dose/Rate Route Frequency Ordered Stop   01/17/23 1230  ciprofloxacin (CIPRO) tablet 750 mg        750 mg Oral 2 times daily 01/17/23 1134 01/26/23 0759   01/17/23 1000  amoxicillin-clavulanate (AUGMENTIN) 875-125 MG per tablet 1 tablet  Status:  Discontinued        1 tablet Oral Every 12 hours 01/16/23 1152 01/17/23 1134   01/16/23 1200  cefTRIAXone (ROCEPHIN) 2 g in sodium chloride 0.9 % 100 mL IVPB  Status:  Discontinued        2 g 200 mL/hr over 30 Minutes Intravenous Every 24 hours 01/15/23 1402 01/16/23 1152   01/15/23 2200  cefdinir (OMNICEF) capsule 300 mg  Status:  Discontinued        300 mg Oral Every 12 hours 01/15/23 1348 01/15/23 1401   01/13/23 2145  vancomycin (VANCOREADY) IVPB 750 mg/150 mL  Status:  Discontinued        750 mg 150 mL/hr over 60 Minutes Intravenous Every 24 hours 01/12/23 2145 01/13/23 0927   01/13/23 1100  cefTRIAXone (ROCEPHIN) 2 g in sodium chloride 0.9 % 100 mL IVPB  Status:  Discontinued        2 g 200 mL/hr over 30 Minutes Intravenous Every 24 hours 01/13/23 0927 01/15/23 1348   01/13/23 1015  ceFEPIme (MAXIPIME) 2 g in sodium chloride 0.9 % 100 mL IVPB  Status:  Discontinued        2 g 200 mL/hr over 30 Minutes Intravenous Every 12 hours 01/12/23 2209 01/13/23 0927   01/12/23 2145  vancomycin (VANCOCIN) IVPB 1000 mg/200 mL premix       See Hyperspace for full Linked Orders Report.   1,000 mg 200 mL/hr over 60 Minutes Intravenous  Once 01/12/23 2143 01/13/23 0100   01/12/23 2145  vancomycin (VANCOCIN) IVPB 1000 mg/200 mL premix       See Hyperspace for full Linked Orders Report.   1,000 mg 200 mL/hr over 60 Minutes Intravenous  Once 01/12/23 2143 01/13/23 0100   01/12/23 2045  ceFEPIme (MAXIPIME) 2 g in sodium chloride 0.9 % 100 mL IVPB        2 g 200 mL/hr over 30 Minutes Intravenous  Once 01/12/23 2039 01/12/23 2129   01/12/23 2045   metroNIDAZOLE (FLAGYL) IVPB 500 mg        500  mg 100 mL/hr over 60 Minutes Intravenous  Once 01/12/23 2039 01/12/23 2233   01/12/23 2045  vancomycin (VANCOCIN) IVPB 1000 mg/200 mL premix  Status:  Discontinued        1,000 mg 200 mL/hr over 60 Minutes Intravenous  Once 01/12/23 2039 01/12/23 2143      Culture/Microbiology    Component Value Date/Time   SDES BLOOD LEFT ANTECUBITAL 01/14/2023 0828   SDES BLOOD LEFT HAND 01/14/2023 0828   SPECREQUEST  01/14/2023 1610    BOTTLES DRAWN AEROBIC ONLY Blood Culture results may not be optimal due to an inadequate volume of blood received in culture bottles   SPECREQUEST  01/14/2023 0828    BOTTLES DRAWN AEROBIC ONLY Blood Culture results may not be optimal due to an inadequate volume of blood received in culture bottles   CULT  01/14/2023 0828    NO GROWTH 3 DAYS Performed at [facility], [address], [place], [place] [address]    CULT  01/14/2023 [phone]    NO GROWTH 3 DAYS Performed at [facility], [address] [place], [place] [address]    REPTSTATUS PENDING 01/14/2023 7829   REPTSTATUS PENDING 01/14/2023 5621  Radiology Studies: No results found.   LOS: 4 days   Lanae Boast, MD [facility]  01/17/2023, 11:44 AM

## 2023-01-17 NOTE — Progress Notes (Signed)
Physical Therapy Treatment Patient Details Name: [name] [name] MRN: [id number] DOB: Dec 22, 1945 Today's Date: 01/17/2023   History of Present Illness Pt is 77 year old presented to [facility] on  01/12/23 for weakness and fever. Pt with severe sepsis due to UTI.  PMH - T12-L1 fx with ORIF 11/23/22, morbid obesity, OSA, OHS, DM2, HTN, HLD, COPD, severe pulmonary hypertension, chronic diastolic and right-sided CHF, chronic 3 to 5 L oxygen requirement, permanent A-fib on Eliquis, migraine, arthritis, gout    PT Comments    Pt tolerated treatment well today. Co treat with OT. Pt with similar presentation to previous session able to transfer from bed to [facility] then to chair +2 Min A RW. HR up to 167 during transfer however quickly recovers to 130s. RN made aware. No change in DC/DME recs at this time. PT will continue to follow.     Recommendations for follow up therapy are one component of a multi-disciplinary discharge planning process, led by the attending physician.  Recommendations may be updated based on patient status, additional functional criteria and insurance authorization.  Follow Up Recommendations  Can patient physically be transported by private vehicle: No    Assistance Recommended at Discharge Frequent or constant Supervision/Assistance  Patient can return home with the following A lot of help with walking and/or transfers;A lot of help with bathing/dressing/bathroom;Assist for transportation   Equipment Recommendations  None recommended by PT    Recommendations for Other Services       Precautions / Restrictions Precautions Precautions: Fall;Back Required Braces or Orthoses: Spinal Brace Spinal Brace: Applied in standing position (Clam shell brace) Restrictions Weight Bearing Restrictions: No     Mobility  Bed Mobility Overal bed mobility: Needs Assistance Bed Mobility: Rolling, Sidelying to Sit Rolling: [name] [name] assist, +2 for physical assistance Sidelying to sit: +2 for physical  assistance, [name] [name] assist            Transfers Overall transfer level: Needs assistance Equipment used: Rolling walker (2 wheels) Transfers: Sit to/from Stand, Bed to chair/wheelchair/BSC Sit to Stand: Min assist, From elevated surface   Step pivot transfers: +2 physical assistance, Min assist       General transfer comment: HR up to 167 during transfer. Able to transfer to [facility] and then to chair.    Ambulation/Gait                   Stairs             Wheelchair Mobility    Modified Rankin (Stroke Patients Only)       Balance Overall balance assessment: Needs assistance Sitting-balance support: Feet supported, No upper extremity supported Sitting balance-[name] Scale: Fair     Standing balance support: Bilateral upper extremity supported, During functional activity, Reliant on assistive device for balance Standing balance-[name] Scale: Poor Standing balance comment: Reliant on RW                            Cognition Arousal/Alertness: Awake/alert Behavior During Therapy: WFL for tasks assessed/performed Overall Cognitive Status: Within Functional Limits for tasks assessed                                          Exercises      General Comments General comments (skin integrity, edema, etc.): HR up to 167 during transfer however quickly recovers to 130s.  RN made aware. SpO2 above 95% on 2L      Pertinent Vitals/Pain Pain Assessment Pain Assessment: Faces Faces Pain Scale: Hurts even more Pain Location: Per pt report, all over Pain Descriptors / Indicators: Grimacing, Guarding, Moaning Pain Intervention(s): Monitored during session, Limited activity within patient's tolerance    Home Living                          Prior Function            PT Goals (current goals can now be found in the care plan section) Progress towards PT goals: Progressing toward goals    Frequency    Min 1X/week       PT Plan Current plan remains appropriate    Co-evaluation PT/OT/SLP Co-Evaluation/Treatment: Yes Reason for Co-Treatment: Complexity of the patient's impairments (multi-system involvement);For patient/therapist safety;To address functional/ADL transfers PT goals addressed during session: Mobility/safety with mobility;Proper use of DME OT goals addressed during session: ADL's and self-care      AM-PAC PT "6 Clicks" Mobility   Outcome Measure  Help needed turning from your back to your side while in a flat bed without using bedrails?: A Lot Help needed moving from lying on your back to sitting on the side of a flat bed without using bedrails?: A Lot Help needed moving to and from a bed to a chair (including a wheelchair)?: A Lot Help needed standing up from a chair using your arms (e.g., wheelchair or bedside chair)?: A Little Help needed to walk in hospital room?: A Lot Help needed climbing 3-5 steps with a railing? : Total 6 Click Score: 12    End of Session Equipment Utilized During Treatment: Oxygen;Other (comment) (Clam shell brace) Activity Tolerance: Patient tolerated treatment well;Treatment limited secondary to medical complications (Comment) (HR up to 167) Patient left: in chair;with call bell/phone within reach;with chair alarm set;with family/visitor present Nurse Communication: Mobility status PT Visit Diagnosis: Other abnormalities of gait and mobility (R26.89);History of falling (Z91.81);Muscle weakness (generalized) (M62.81);Pain     Time: 2956-2130 PT Time Calculation (min) (ACUTE ONLY): 33 min  Charges:  $Therapeutic Activity: 8-22 mins                     Shela Nevin, PT, DPT Acute Rehab Services [phone]    Gladys Damme 01/17/2023, 10:58 AM

## 2023-01-17 NOTE — Care Management Important Message (Signed)
Important Message  Patient Details  Name: [name] [name] MRN: [id number] Date of Birth: 30-Aug-1945   Medicare Important Message Given:  Yes     Sherilyn Banker 01/17/2023, 3:14 PM

## 2023-01-17 NOTE — Progress Notes (Signed)
Patient not able to void 6 hours after foley catheter removal. Bladder scan volume . In and out done by two Rns following sterile procedure. Patient encountered severe discomfort during procedure due to penile edema. tea colored urine obtained. Patient expressed relief after the procedure. Peri care done before and after the in and out procedure. Elnita Maxwell, RN

## 2023-01-17 NOTE — Hospital Course (Addendum)
77 year old male with history of paroxysmal A-fib on Eliquis, chronic diastolic CHF, COPD, chronic hypoxic respiratory failure on 4 L of oxygen at home, diabetes type 2 who presented from home with complaint of fever, chills, dysuria, lower abdominal pain.  He was just discharged from rehab to home. He lives with his daughter. On presentation, he was febrile, rapid A-fib, blood pressure was low.  UA was suspicious for UTI.  CT abdomen /pelvis showed left kidney stranding, mild hydronephrosis without obstruction.  Lab work showed leukocytosis.  Patient was admitted for the management of severe sepsis secondary to UTI.  Started on broad spectrum antibiotics and treated for sepsis due to complicated UTI.  Urine and blood cultures showing Klebsiella.  Patient was transitioned to oral antibiotics 6/10 based on sensitivity to Unasyn. Other medical issues including chronic diastolic CHF hyperlipidemia A-fib BPH gout stable. Otherwise stable and pending placement Attempted voiding trial 6/10 failed Foley needed to be reinserted he will need outpatient urology follow-up in 1 week At this time remains stable, waiting for insurance approval for Snf> has been obtaining for discharge 6/12. Remains medically stable.

## 2023-01-17 NOTE — TOC Progression Note (Signed)
Transition of Care [facility]) - Progression Note    Patient Details  Name: [name] [name] MRN: [id number] Date of Birth: 1945/08/24  Transition of Care [facility] CM/SW Contact  Leander Rams, LCSW Phone Number: 01/17/2023, 4:06 PM  Clinical Narrative:    CSW met with pt in room to provide SNF bed offers. Pt states he believes his daughter will be here tonight and they will review the list. Pt will notify CSW of facility they have chosen once they review.   TOC will continue to follow.    Expected Discharge Plan: Skilled Nursing Facility Barriers to Discharge: Continued Medical Work up  Expected Discharge Plan and Services In-house Referral: Clinical Social Work Discharge Planning Services: CM Consult Post Acute Care Choice: Home Health Living arrangements for the past 2 months: Single Family Home                 DME Arranged: N/A DME Agency: NA       HH Arranged: RN, PT, OT, Speech Therapy HH Agency: [facility] Date HH Agency Contacted: 01/14/23 Time HH Agency Contacted: 1345 Representative spoke with at [facility] Agency: Morrie Sheldon   Social Determinants of Health (SDOH) Interventions SDOH Screenings   Food Insecurity: [name] Food Insecurity (11/25/2022)  Housing: Low Risk  (11/25/2022)  Transportation Needs: [name] Transportation Needs (11/25/2022)  Utilities: Not At Risk (11/25/2022)  Depression (PHQ2-9): Low Risk  (10/08/2022)  Tobacco Use: Medium Risk (01/12/2023)    Readmission Risk Interventions    01/14/2023    1:35 PM  Readmission Risk Prevention Plan  Transportation Screening Complete  Medication Review (RN Care Manager) Complete  PCP or Specialist appointment within 3-5 days of discharge Complete  HRI or Home Care Consult Complete  Palliative Care Screening Not Applicable  Skilled Nursing Facility Not Applicable   Oletta Lamas, MSW, LCSWA, LCASA Transitions of Care  Clinical Social Worker I

## 2023-01-18 DIAGNOSIS — N390 Urinary tract infection, site not specified: Secondary | ICD-10-CM | POA: Diagnosis not present

## 2023-01-18 LAB — GLUCOSE, CAPILLARY
Glucose-Capillary: 102 mg/dL — ABNORMAL HIGH (ref 70–99)
Glucose-Capillary: 107 mg/dL — ABNORMAL HIGH (ref 70–99)
Glucose-Capillary: 96 mg/dL (ref 70–99)
Glucose-Capillary: 129 mg/dL — ABNORMAL HIGH (ref 70–99)

## 2023-01-18 LAB — CBC
HCT: 33.5 % — ABNORMAL LOW (ref 39.0–52.0)
Hemoglobin: 10.2 g/dL — ABNORMAL LOW (ref 13.0–17.0)
MCH: 29 pg (ref 26.0–34.0)
MCHC: 30.4 g/dL (ref 30.0–36.0)
MCV: 95.2 fL (ref 80.0–100.0)
Platelets: 273 10*3/uL (ref 150–400)
RBC: 3.52 MIL/uL — ABNORMAL LOW (ref 4.22–5.81)
RDW: 16.9 % — ABNORMAL HIGH (ref 11.5–15.5)
WBC: 14.4 10*3/uL — ABNORMAL HIGH (ref 4.0–10.5)
nRBC: 0 % (ref 0.0–0.2)

## 2023-01-18 LAB — BASIC METABOLIC PANEL
Anion gap: 8 (ref 5–15)
BUN: 19 mg/dL (ref 8–23)
CO2: 26 mmol/L (ref 22–32)
Calcium: 8.8 mg/dL — ABNORMAL LOW (ref 8.9–10.3)
Chloride: 112 mmol/L — ABNORMAL HIGH (ref 98–111)
Creatinine, Ser: 1.47 mg/dL — ABNORMAL HIGH (ref 0.61–1.24)
GFR, Estimated: 49 mL/min — ABNORMAL LOW (ref >60)
Glucose, Bld: 104 mg/dL — ABNORMAL HIGH (ref 70–99)
Potassium: 3.9 mmol/L (ref 3.5–5.1)
Sodium: 146 mmol/L — ABNORMAL HIGH (ref 135–145)

## 2023-01-18 IMAGING — DX DG LUMBAR SPINE 2-3V
3 series · 3 of 3 positions shown · non-contrast
Comparison: None.

CLINICAL DATA: Chronic lumbar pain.  No reported injury.

EXAM:
LUMBAR SPINE - 2-3 VIEW

[l-spine lateral (1 of 2)]
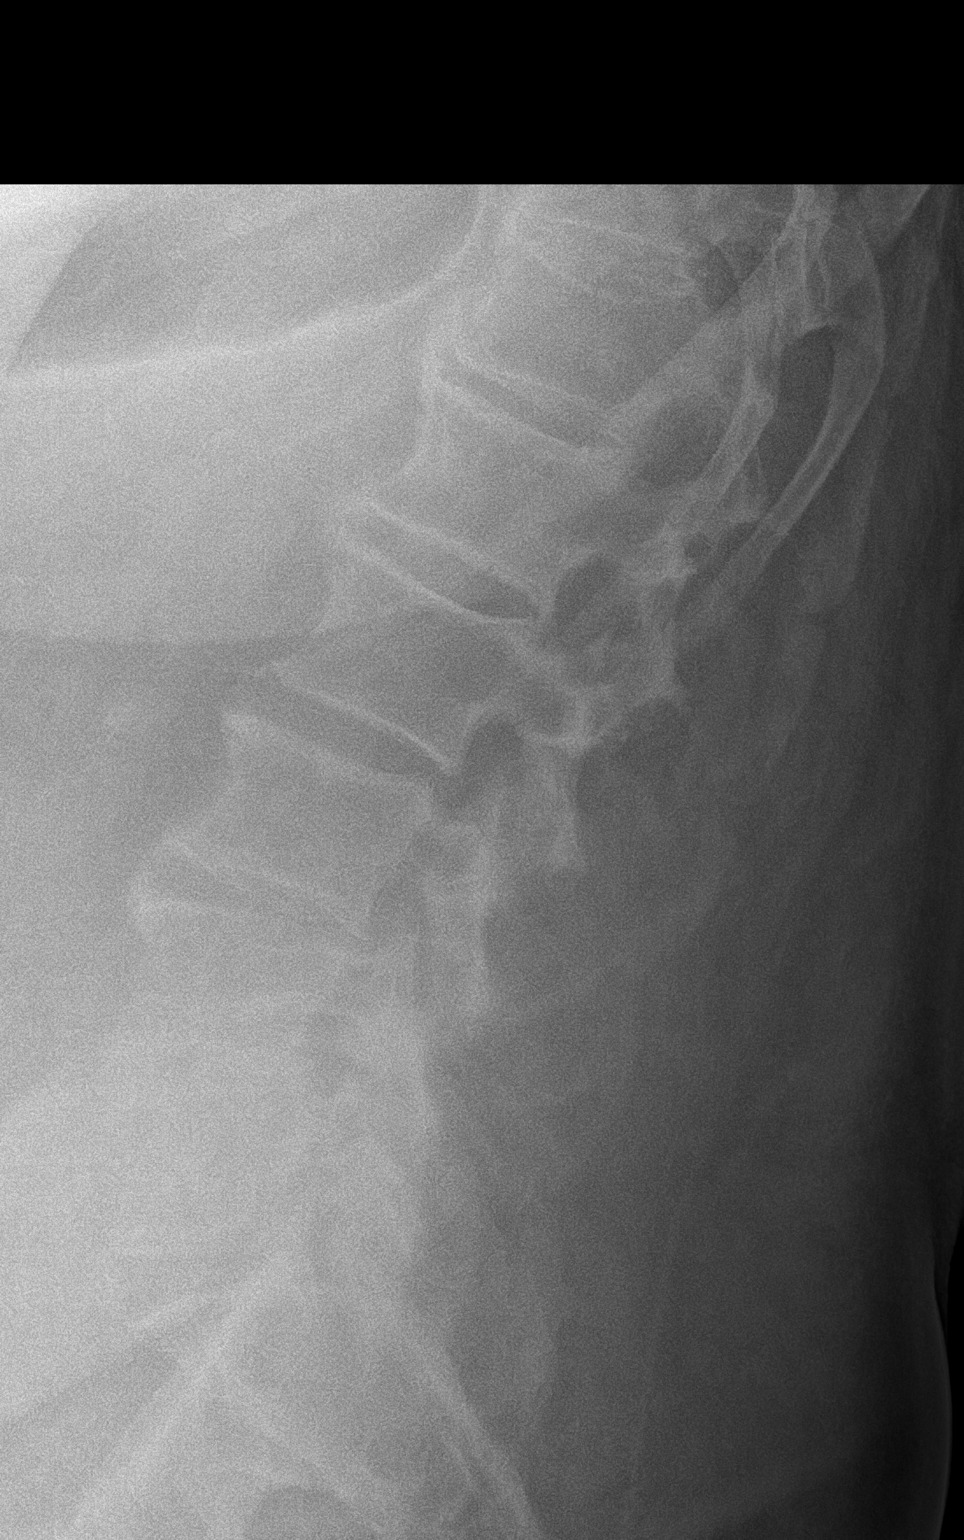

[l-spine lateral (2 of 2)]
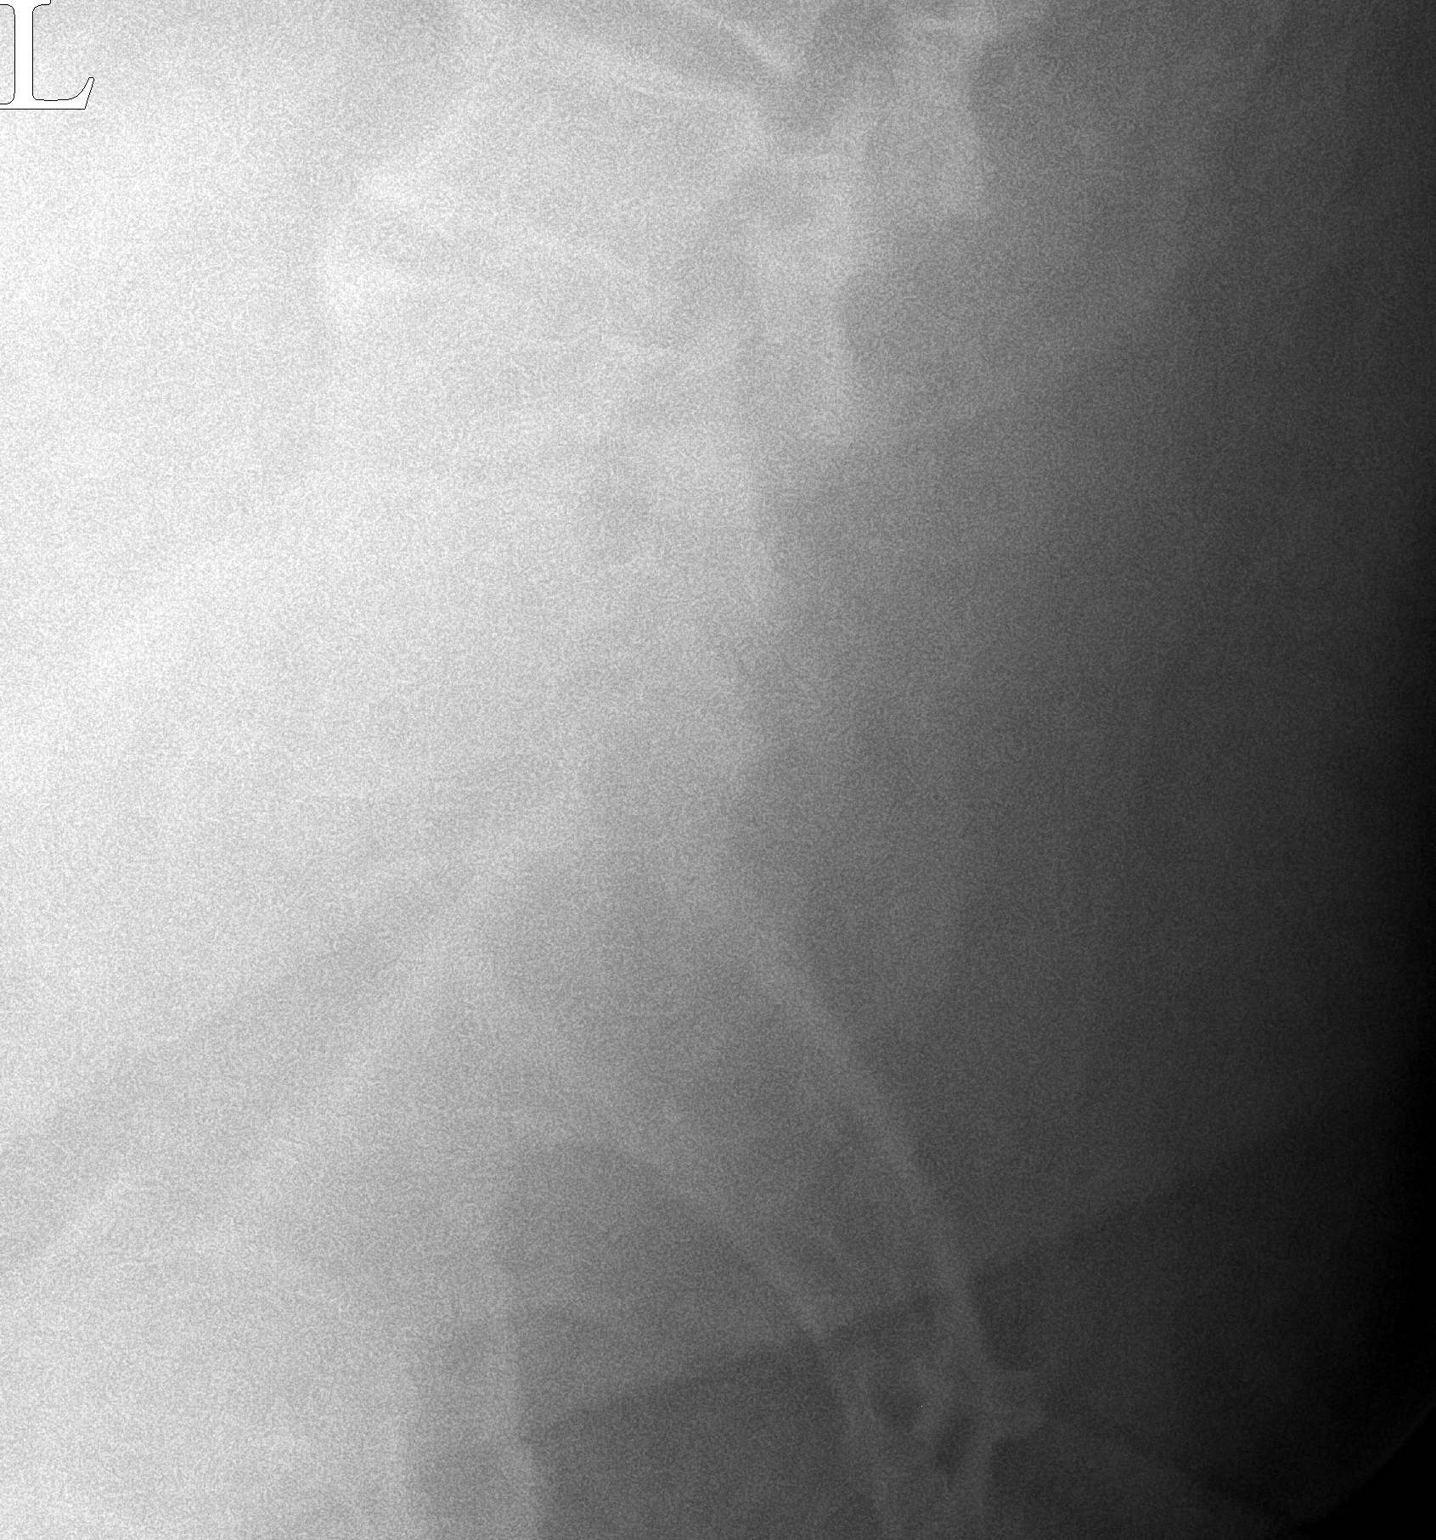

[l-spine ap]
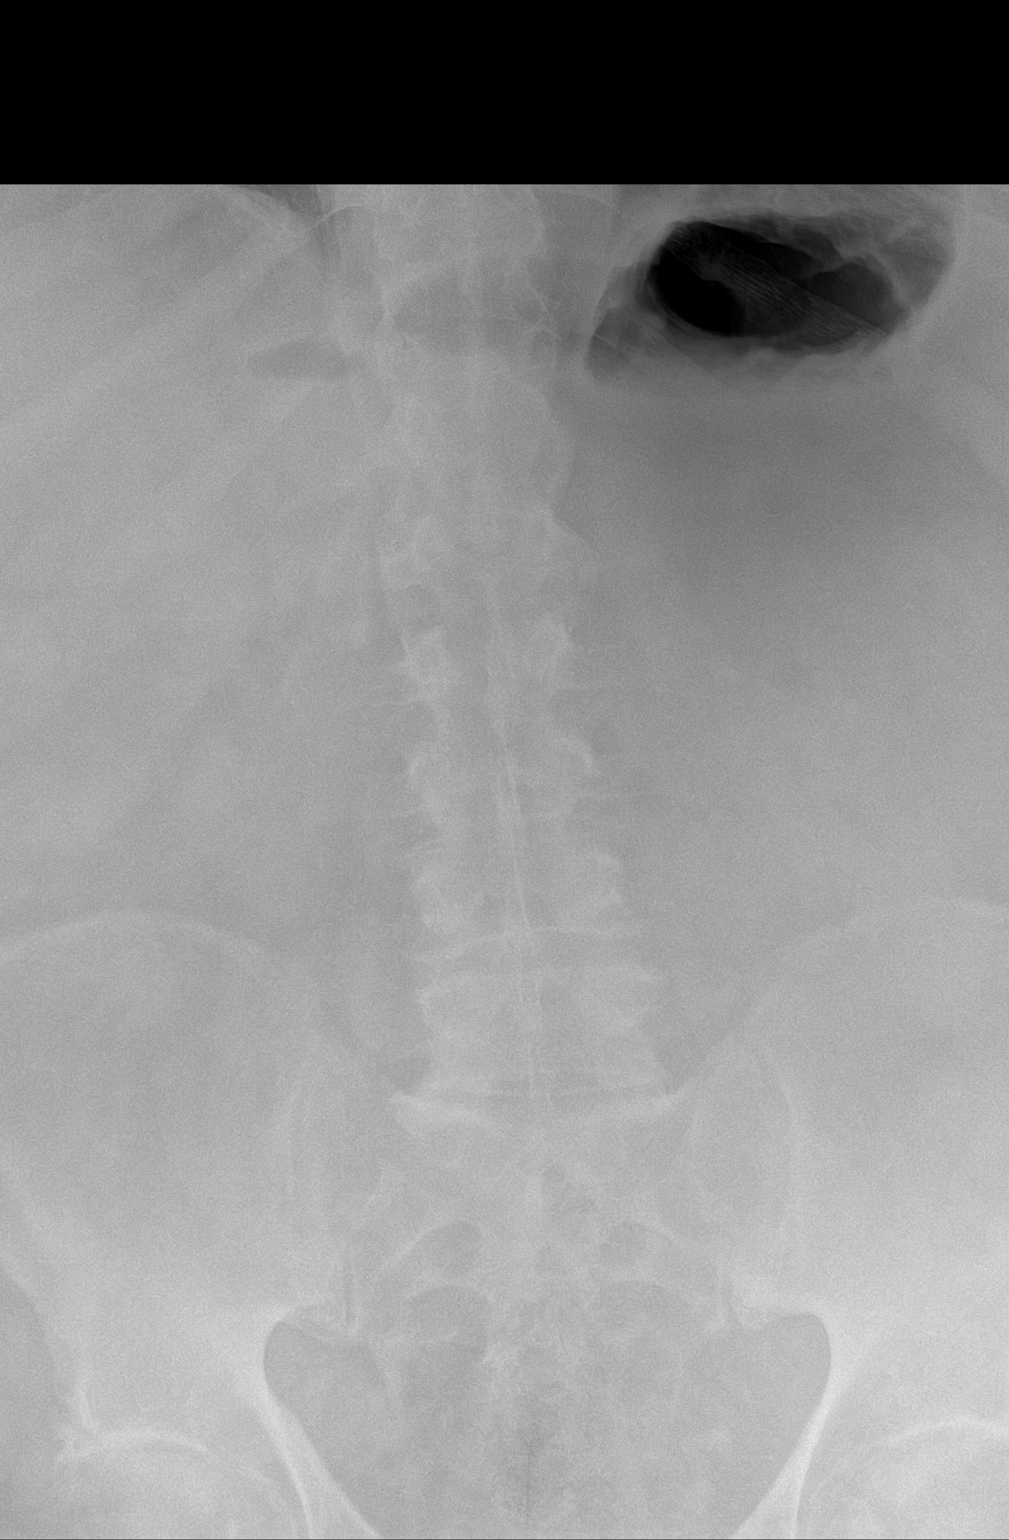

[3 of 3 positions shown; findings below may reference images not displayed]

FINDINGS: Image quality is degraded by body habitus, especially in the L4-5
region. Alignment is anatomic. Multilevel endplate degenerative
changes with facet hypertrophy in the lower lumbar spine.
IMPRESSION: 1. Image quality is degraded by body habitus, as discussed above.
2. Multilevel degenerative disc disease with facet hypertrophy in
the lower lumbar spine.

## 2023-01-18 NOTE — Progress Notes (Signed)
   01/18/23 0022  BiPAP/CPAP/SIPAP  $ Non-Invasive Home Ventilator  Initial  $ Face Mask Medium Yes  BiPAP/CPAP/SIPAP Pt Type Adult  BiPAP/CPAP/SIPAP Resmed  Mask Type Full face mask  Mask Size Medium  IPAP 2 cmH20  EPAP 17 cmH2O  Flow Rate 2 lpm (bled in)  BiPAP/CPAP /SiPAP Vitals  Pulse Rate 96 (-114)  Resp (!) 28  SpO2 96 %  MEWS Score/Color  MEWS Score 2  MEWS Score Color Yellow

## 2023-01-18 NOTE — TOC Progression Note (Signed)
Transition of Care [facility]) - Progression Note    Patient Details  Name: [name] [name] MRN: [id number] Date of Birth: 1946/06/18  Transition of Care [facility]) CM/SW Contact  Leander Rams, LCSW Phone Number: 01/18/2023, 11:01 AM  Clinical Narrative:    CSW met with pt at bedside to check on decision for SNF. Pt states he and his daughter did not get to review SNF bed offers. Pt reports his daughter is in the hospital now. CSW offered pt more time to review list and would return this afternoon. Pt states he will try to have a decision by this afternoon.   TOC will continue to follow.    Expected Discharge Plan: Skilled Nursing Facility Barriers to Discharge: Continued Medical Work up  Expected Discharge Plan and Services In-house Referral: Clinical Social Work Discharge Planning Services: CM Consult Post Acute Care Choice: Home Health Living arrangements for the past 2 months: Single Family Home                 DME Arranged: N/A DME Agency: NA       HH Arranged: RN, PT, OT, Speech Therapy HH Agency: [facility] Date HH Agency Contacted: 01/14/23 Time HH Agency Contacted: 1345 Representative spoke with at [facility] Agency: Morrie Sheldon   Social Determinants of Health (SDOH) Interventions SDOH Screenings   Food Insecurity: No Food Insecurity (11/25/2022)  Housing: Low Risk  (11/25/2022)  Transportation Needs: No Transportation Needs (11/25/2022)  Utilities: Not At Risk (11/25/2022)  Depression (PHQ2-9): Low Risk  (10/08/2022)  Tobacco Use: Medium Risk (01/12/2023)    Readmission Risk Interventions    01/14/2023    1:35 PM  Readmission Risk Prevention Plan  Transportation Screening Complete  Medication Review (RN Care Manager) Complete  PCP or Specialist appointment within 3-5 days of discharge Complete  HRI or Home Care Consult Complete  Palliative Care Screening Not Applicable  Skilled Nursing Facility Not Applicable   Oletta Lamas, MSW, LCSWA, LCASA Transitions of Care   Clinical Social Worker I

## 2023-01-18 NOTE — Progress Notes (Signed)
PROGRESS NOTE HASKER DUNSTON  ZOX:[id number] DOB: [birth year] DOA: 01/12/2023 PCP: Corwin Levins, MD  Brief Narrative/Hospital Course:  77 year old male with history of paroxysmal A-fib on Eliquis, chronic diastolic CHF, COPD, chronic hypoxic respiratory failure on 4 L of oxygen at home, diabetes type 2 who presented from home with complaint of fever, chills, dysuria, lower abdominal pain.  He was just discharged from rehab to home. He lives with his daughter. On presentation, he was febrile, rapid A-fib, blood pressure was low.  UA was suspicious for UTI.  CT abdomen /pelvis showed left kidney stranding, mild hydronephrosis without obstruction.  Lab work showed leukocytosis.  Patient was admitted for the management of severe sepsis secondary to UTI.  Started on broad spectrum antibiotics and treated for sepsis due to complicated UTI.  Urine and blood cultures showing Klebsiella.  Patient was transitioned to oral antibiotics 6/10 based on sensitivity to Unasyn. Other medical issues including chronic diastolic CHF hyperlipidemia A-fib BPH gout stable. Otherwise stable and pending placement   Subjective: Seen and examined this morning resting comfortably alert awake no new complaints  Failed voiding trial yesterday and Foley placed back in  Assessment and Plan: Principal Problem:   Complicated UTI (urinary tract infection) Active Problems:   Hyperlipidemia   Urinary frequency   Chronic diastolic CHF (congestive heart failure) (HCC)   Chronic a-fib (HCC)   Sepsis (HCC)   Bacteremia   Sepsis with Klebsiella bacteremia Complicated UTI: Treated with ceftriaxone> transitioned to Augmentin 6/10 but given Klebsiella sensitive pattern w/ MIC for unasyn -changed to ciprofloxacin per pharmacy 6/10.2nd set of BCx's w NGTD.  Leukocytosis at 14.1 downtrending from 20.9, overall vitals stable, afebrile. Recent Labs  Lab 01/12/23 2034 01/12/23 2237 01/14/23 0827 01/15/23 0122 01/16/23 0119 01/17/23 0117  01/18/23 0041  WBC 18.9*  --  23.1* 20.9* 15.9* 14.1* 14.4*  LATICACIDVEN 2.3* 1.3  --   --   --   --   --     BPH on Flomax Urine retention w/ WJX:BJYNWGNF creatinine ~1.2-1.4:On admission 1.9 downtrending with IV fluid hydration.  Failed voiding trial and Foley Bacitin will need outpatient follow-up with urology.  Discontinue IV fluids  Chronic diastolic AOZ:HYQMVHQIO 25 mg bid.Needing IVF for AKI> creatinine much better discontinue IV fluids Hyperlipidemia: cont Lipitor Chronic a-fib : Rate controlled, continue on Eliquis and Lopressor  Gout on allopurinol Anemia appears chronic in 10 to 11 g range.  Monitor Fecal incontinence/Loose stool ongoing. Monitor. Diet as tolerated. Cont Floraster.  Class II Obesity w/ NGE:[id number]'K Body mass index is 39.76 kg/m. : Will benefit with PCP follow-up, weight loss  healthy lifestyle. Ordered home CPAP as he takes one at home- daughter will try to bring home machine   DVT prophylaxis: Eliquis Code Status:   Code Status: Full Code Family Communication: plan of care discussed with patient and his daughter at bedside. Patient status is:  INPATIENT  because of SEPSIS Level of care: Progressive   Dispo: The patient is from: home w/ daughter            Anticipated disposition: Awaiting on a skilled nursing facility   Objective: Vitals last 24 hrs: Vitals:   01/18/23 0022 01/18/23 0052 01/18/23 0611 01/18/23 0700  BP:  (!) 139/101 (!) 153/104 (!) 149/90  Pulse: 96 88 96 97  Resp: (!) 28 20 18 18   Temp:  97.8 F (36.6 C) 97.8 F (36.6 C) 98.3 F (36.8 C)  TempSrc:  Oral Oral Oral  SpO2: 96%  99% 98%  Weight:  118.6 kg    Height:       Weight change: 0.4 kg  Physical Examination: General exam: AA, obese weak,older appearing HEENT:Oral mucosa moist, Ear/Nose WNL grossly, dentition normal. Respiratory system: bilaterally diminished BS, no use of accessory muscle Cardiovascular system: S1 & S2 +, regular rate. Gastrointestinal system:  Abdomen soft, obese,NT,ND,BS+ Nervous System:Alert, awake, moving extremities and grossly nonfocal Extremities: LE ankle edema + w/ chronic hyperpigmentation and hyperkeratosis of lower extremities Skin: No rashes,no icterus. MSK: Normal muscle bulk,tone, power   Medications reviewed:  Scheduled Meds:  allopurinol  100 mg Oral Daily   apixaban  5 mg Oral BID   atorvastatin  20 mg Oral Daily   Chlorhexidine Gluconate Cloth  6 each Topical Daily   ciprofloxacin  750 mg Oral BID   insulin aspart  0-9 Units Subcutaneous TID WC   metoprolol tartrate  25 mg Oral BID   saccharomyces boulardii  250 mg Oral BID   senna-docusate  1 tablet Oral BID   tamsulosin  0.4 mg Oral QPC supper   Continuous Infusions:  sodium chloride 50 mL/hr at 01/18/23 0600   Diet Order             Diet heart healthy/carb modified Room service appropriate? Yes; Fluid consistency: Thin  Diet effective now                  Intake/Output Summary (Last 24 hours) at 01/18/2023 1120 Last data filed at 01/18/2023 0600 Gross per 24 hour  Intake 1296.04 ml  Output 2250 ml  Net -953.96 ml    Net IO Since Admission: -8,403.87 mL [01/18/23 1120]  Wt Readings from Last 3 Encounters:  01/18/23 118.6 kg  11/18/22 124.7 kg  10/08/22 124.7 kg     Unresulted Labs (From admission, onward)     Start     Ordered   01/16/23 0500  Basic metabolic panel  Daily,   R     Question:  Specimen collection method  Answer:  Unit=Unit collect   01/15/23 1355   01/16/23 0500  CBC  Daily,   R     Question:  Specimen collection method  Answer:  Unit=Unit collect   01/15/23 1355          Data Reviewed: I have personally reviewed following labs and imaging studies CBC: Recent Labs  Lab 01/12/23 2034 01/12/23 2128 01/14/23 0827 01/15/23 0122 01/16/23 0119 01/17/23 0117 01/18/23 0041  WBC 18.9*  --  23.1* 20.9* 15.9* 14.1* 14.4*  NEUTROABS 15.9*  --   --   --   --   --   --   HGB 12.9*   < > 10.7* 10.5* 11.1* 10.4*  10.2*  HCT 40.0   < > 33.8* 33.3* 35.2* 33.3* 33.5*  MCV 89.5  --  93.1 92.0 94.6 92.8 95.2  PLT 314  --  208 218 212 253 273   < > = values in this interval not displayed.    Basic Metabolic Panel: Recent Labs  Lab 01/13/23 1256 01/14/23 0827 01/15/23 0122 01/16/23 0119 01/17/23 0117 01/18/23 0041  NA  --  141 141 144 144 146*  K  --  3.8 4.0 3.7 3.7 3.9  CL  --  107 106 109 109 112*  CO2  --  26 26 27 26 26   GLUCOSE  --  99 114* 155* 112* 104*  BUN  --  25* 25* 25* 22 19  CREATININE  --  1.97* 1.83* 1.65*  1.63* 1.47*  CALCIUM  --  8.7* 8.8* 9.0 8.9 8.8*  MG 1.4* 1.6* 2.0  --   --   --   PHOS 1.7* 4.2  --   --   --   --     GFR: Estimated Creatinine Clearance: 52.7 mL/min (A) (by C-G formula based on SCr of 1.47 mg/dL (H)). Liver Function Tests: Recent Labs  Lab 01/12/23 2034  AST 18  ALT 26  ALKPHOS 56  BILITOT 0.8  PROT 7.3  ALBUMIN 2.9*    Recent Labs  Lab 01/12/23 2034  INR 1.5*    CBG: Recent Labs  Lab 01/17/23 0612 01/17/23 1136 01/17/23 1620 01/17/23 2115 01/18/23 0608  GLUCAP 96 102* 111* 116* 102*    Recent Labs  Lab 01/12/23 2034 01/12/23 2237  LATICACIDVEN 2.3* 1.3     Recent Results (from the past 240 hour(s))  Culture, blood (Routine x 2)     Status: Abnormal   Collection Time: 01/12/23  8:24 PM   Specimen: BLOOD LEFT ARM  Result Value Ref Range Status   Specimen Description   Final    BLOOD LEFT ARM Performed at [facility], [address], [place], [place] [address]    Special Requests   Final    BOTTLES DRAWN AEROBIC AND ANAEROBIC Blood Culture adequate volume Performed at [facility], [address], [place], [place] [address]    Culture  Setup Time   Final    GRAM NEGATIVE RODS AEROBIC BOTTLE ONLY CRITICAL RESULT CALLED TO, READ BACK BY AND VERIFIED WITH: Vivien Rossetti [id number] @1348  BY SM Performed at [facility], [address], [place], [place] [address]    Culture  KLEBSIELLA PNEUMONIAE (A)  Final   Report Status 01/15/2023 FINAL  Final   Organism ID, Bacteria KLEBSIELLA PNEUMONIAE  Final      Susceptibility   Klebsiella pneumoniae - MIC*    AMPICILLIN >=32 RESISTANT Resistant     CEFEPIME <=0.12 SENSITIVE Sensitive     CEFTAZIDIME <=1 SENSITIVE Sensitive     CEFTRIAXONE <=0.25 SENSITIVE Sensitive     CIPROFLOXACIN <=0.25 SENSITIVE Sensitive     GENTAMICIN <=1 SENSITIVE Sensitive     IMIPENEM <=0.25 SENSITIVE Sensitive     TRIMETH/SULFA <=20 SENSITIVE Sensitive     AMPICILLIN/SULBACTAM 8 SENSITIVE Sensitive     PIP/TAZO <=4 SENSITIVE Sensitive     * KLEBSIELLA PNEUMONIAE  Blood Culture ID Panel (Reflexed)     Status: Abnormal   Collection Time: 01/12/23  8:24 PM  Result Value Ref Range Status   Enterococcus faecalis NOT DETECTED NOT DETECTED Final   Enterococcus Faecium NOT DETECTED NOT DETECTED Final   Listeria monocytogenes NOT DETECTED NOT DETECTED Final   Staphylococcus species NOT DETECTED NOT DETECTED Final   Staphylococcus aureus (BCID) NOT DETECTED NOT DETECTED Final   Staphylococcus epidermidis NOT DETECTED NOT DETECTED Final   Staphylococcus lugdunensis NOT DETECTED NOT DETECTED Final   Streptococcus species NOT DETECTED NOT DETECTED Final   Streptococcus agalactiae NOT DETECTED NOT DETECTED Final   Streptococcus pneumoniae NOT DETECTED NOT DETECTED Final   Streptococcus pyogenes NOT DETECTED NOT DETECTED Final   A.calcoaceticus-baumannii NOT DETECTED NOT DETECTED Final   Bacteroides fragilis NOT DETECTED NOT DETECTED Final   Enterobacterales DETECTED (A) NOT DETECTED Final    Comment: Enterobacterales represent a large order of gram negative bacteria, not a single organism. CRITICAL RESULT CALLED TO, READ BACK BY AND VERIFIED WITH: PHARMD ELIZABETH [id number] @1348  BY SM  Enterobacter cloacae complex NOT DETECTED NOT DETECTED Final   Escherichia coli NOT DETECTED NOT DETECTED Final   Klebsiella aerogenes NOT DETECTED NOT DETECTED  Final   Klebsiella oxytoca NOT DETECTED NOT DETECTED Final   Klebsiella pneumoniae DETECTED (A) NOT DETECTED Final    Comment: CRITICAL RESULT CALLED TO, READ BACK BY AND VERIFIED WITH: PHARMD ELIZABETH [id number] @1348  BY SM    Proteus species NOT DETECTED NOT DETECTED Final   Salmonella species NOT DETECTED NOT DETECTED Final   Serratia marcescens NOT DETECTED NOT DETECTED Final   Haemophilus influenzae NOT DETECTED NOT DETECTED Final   Neisseria meningitidis NOT DETECTED NOT DETECTED Final   Pseudomonas aeruginosa NOT DETECTED NOT DETECTED Final   Stenotrophomonas maltophilia NOT DETECTED NOT DETECTED Final   Candida albicans NOT DETECTED NOT DETECTED Final   Candida auris NOT DETECTED NOT DETECTED Final   Candida glabrata NOT DETECTED NOT DETECTED Final   Candida krusei NOT DETECTED NOT DETECTED Final   Candida parapsilosis NOT DETECTED NOT DETECTED Final   Candida tropicalis NOT DETECTED NOT DETECTED Final   Cryptococcus neoformans/gattii NOT DETECTED NOT DETECTED Final   CTX-M ESBL NOT DETECTED NOT DETECTED Final   Carbapenem resistance IMP NOT DETECTED NOT DETECTED Final   Carbapenem resistance KPC NOT DETECTED NOT DETECTED Final   Carbapenem resistance NDM NOT DETECTED NOT DETECTED Final   Carbapenem resist OXA 48 LIKE NOT DETECTED NOT DETECTED Final   Carbapenem resistance VIM NOT DETECTED NOT DETECTED Final    Comment: Performed at [facility], [address], [place], [place] [address]  Culture, blood (Routine x 2)     Status: Abnormal   Collection Time: 01/12/23  8:26 PM   Specimen: BLOOD RIGHT FOREARM  Result Value Ref Range Status   Specimen Description   Final    BLOOD RIGHT FOREARM Performed at [facility], [address] [place], [place] [address]    Special Requests   Final    BOTTLES DRAWN AEROBIC AND ANAEROBIC Blood Culture results may not be optimal due to an excessive volume of blood received in culture bottles Performed at [facility], [address] [place], [place] [address]    Culture  Setup Time   Final    GRAM NEGATIVE RODS IN BOTH AEROBIC AND ANAEROBIC BOTTLES CRITICAL VALUE NOTED.  VALUE IS CONSISTENT WITH PREVIOUSLY REPORTED AND CALLED VALUE.    Culture (A)  Final    KLEBSIELLA PNEUMONIAE SUSCEPTIBILITIES PERFORMED ON PREVIOUS CULTURE WITHIN THE LAST 5 DAYS. Performed at [facility], [address], [place], [place] [address]    Report Status 01/15/2023 FINAL  Final  Resp panel by RT-PCR (RSV, Flu A&B, Covid) Anterior Nasal Swab     Status: None   Collection Time: 01/12/23  8:39 PM   Specimen: Anterior Nasal Swab  Result Value Ref Range Status   SARS Coronavirus 2 by RT PCR NEGATIVE NEGATIVE Final    Comment: (NOTE) SARS-CoV-2 target nucleic acids are NOT DETECTED.  The SARS-CoV-2 RNA is generally detectable in upper respiratory specimens during the acute phase of infection. The lowest concentration of SARS-CoV-2 viral copies this assay can detect is 138 copies/mL. A negative result does not preclude SARS-Cov-2 infection and should not be used as the sole basis for treatment or other patient management decisions. A negative result may occur with  improper specimen collection/handling, submission of specimen other than nasopharyngeal swab, presence of viral mutation(s) within the areas targeted by this assay, and inadequate  number of viral copies(<138 copies/mL). A negative result must be combined with clinical observations, patient history, and epidemiological information. The expected result is Negative.  Fact Sheet for Patients:  BloggerCourse.com  Fact Sheet for Healthcare Providers:  SeriousBroker.it  This test is no t yet approved or cleared by the [place] FDA and  has been authorized for detection and/or diagnosis of SARS-CoV-2 by FDA under an Emergency Use Authorization (EUA). This EUA will remain  in effect  (meaning this test can be used) for the duration of the COVID-19 declaration under Section 564(b)(1) of the Act, 21 U.S.C.section 360bbb-3(b)(1), unless the authorization is terminated  or revoked sooner.       Influenza A by PCR NEGATIVE NEGATIVE Final   Influenza B by PCR NEGATIVE NEGATIVE Final    Comment: (NOTE) The Xpert Xpress SARS-CoV-2/FLU/RSV plus assay is intended as an aid in the diagnosis of influenza from Nasopharyngeal swab specimens and should not be used as a sole basis for treatment. Nasal washings and aspirates are unacceptable for Xpert Xpress SARS-CoV-2/FLU/RSV testing.  Fact Sheet for Patients: BloggerCourse.com  Fact Sheet for Healthcare Providers: SeriousBroker.it  This test is not yet approved or cleared by the [place] FDA and has been authorized for detection and/or diagnosis of SARS-CoV-2 by FDA under an Emergency Use Authorization (EUA). This EUA will remain in effect (meaning this test can be used) for the duration of the COVID-19 declaration under Section 564(b)(1) of the Act, 21 U.S.C. section 360bbb-3(b)(1), unless the authorization is terminated or revoked.     Resp Syncytial Virus by PCR NEGATIVE NEGATIVE Final    Comment: (NOTE) Fact Sheet for Patients: BloggerCourse.com  Fact Sheet for Healthcare Providers: SeriousBroker.it  This test is not yet approved or cleared by the [place] FDA and has been authorized for detection and/or diagnosis of SARS-CoV-2 by FDA under an Emergency Use Authorization (EUA). This EUA will remain in effect (meaning this test can be used) for the duration of the COVID-19 declaration under Section 564(b)(1) of the Act, 21 U.S.C. section 360bbb-3(b)(1), unless the authorization is terminated or revoked.  Performed at [facility], [address], [place], [place] [address]   Urine  Culture     Status: Abnormal   Collection Time: 01/12/23 10:37 PM   Specimen: Urine, Random  Result Value Ref Range Status   Specimen Description   Final    URINE, RANDOM Performed at [facility], [address] [place], [place] [address]    Special Requests   Final    NONE Reflexed from [id number] Performed at [facility], [address] [place], [place] [address]    Culture 20,000 COLONIES/mL KLEBSIELLA PNEUMONIAE (A)  Final   Report Status 01/15/2023 FINAL  Final   Organism ID, Bacteria KLEBSIELLA PNEUMONIAE (A)  Final      Susceptibility   Klebsiella pneumoniae - MIC*    AMPICILLIN >=32 RESISTANT Resistant     CEFAZOLIN <=4 SENSITIVE Sensitive     CEFEPIME <=0.12 SENSITIVE Sensitive     CEFTRIAXONE <=0.25 SENSITIVE Sensitive     CIPROFLOXACIN <=0.25 SENSITIVE Sensitive     GENTAMICIN <=1 SENSITIVE Sensitive     IMIPENEM <=0.25 SENSITIVE Sensitive     NITROFURANTOIN 64 INTERMEDIATE Intermediate     TRIMETH/SULFA <=20 SENSITIVE Sensitive     AMPICILLIN/SULBACTAM 4 SENSITIVE Sensitive     PIP/TAZO <=4 SENSITIVE Sensitive     * 20,000 COLONIES/mL KLEBSIELLA PNEUMONIAE  Culture, blood (Routine X 2) w Reflex to ID Panel  Status: None (Preliminary result)   Collection Time: 01/14/23  8:28 AM   Specimen: BLOOD  Result Value Ref Range Status   Specimen Description BLOOD LEFT ANTECUBITAL  Final   Special Requests   Final    BOTTLES DRAWN AEROBIC ONLY Blood Culture results may not be optimal due to an inadequate volume of blood received in culture bottles   Culture   Final    NO GROWTH 4 DAYS Performed at [facility], [address], [place], [place] [address]    Report Status PENDING  Incomplete  Culture, blood (Routine X 2) w Reflex to ID Panel     Status: None (Preliminary result)   Collection Time: 01/14/23  8:28 AM   Specimen: BLOOD LEFT HAND  Result Value Ref Range Status   Specimen Description BLOOD LEFT HAND  Final    Special Requests   Final    BOTTLES DRAWN AEROBIC ONLY Blood Culture results may not be optimal due to an inadequate volume of blood received in culture bottles   Culture   Final    NO GROWTH 4 DAYS Performed at [facility], [address], [place], [place] [address]    Report Status PENDING  Incomplete    Antimicrobials: Anti-infectives (From admission, onward)    Start     Dose/Rate Route Frequency Ordered Stop   01/17/23 1230  ciprofloxacin (CIPRO) tablet 750 mg        750 mg Oral 2 times daily 01/17/23 1134 01/26/23 0759   01/17/23 1000  amoxicillin-clavulanate (AUGMENTIN) 875-125 MG per tablet 1 tablet  Status:  Discontinued        1 tablet Oral Every 12 hours 01/16/23 1152 01/17/23 1134   01/16/23 1200  cefTRIAXone (ROCEPHIN) 2 g in sodium chloride 0.9 % 100 mL IVPB  Status:  Discontinued        2 g 200 mL/hr over 30 Minutes Intravenous Every 24 hours 01/15/23 1402 01/16/23 1152   01/15/23 2200  cefdinir (OMNICEF) capsule 300 mg  Status:  Discontinued        300 mg Oral Every 12 hours 01/15/23 1348 01/15/23 1401   01/13/23 2145  vancomycin (VANCOREADY) IVPB 750 mg/150 mL  Status:  Discontinued        750 mg 150 mL/hr over 60 Minutes Intravenous Every 24 hours 01/12/23 2145 01/13/23 0927   01/13/23 1100  cefTRIAXone (ROCEPHIN) 2 g in sodium chloride 0.9 % 100 mL IVPB  Status:  Discontinued        2 g 200 mL/hr over 30 Minutes Intravenous Every 24 hours 01/13/23 0927 01/15/23 1348   01/13/23 1015  ceFEPIme (MAXIPIME) 2 g in sodium chloride 0.9 % 100 mL IVPB  Status:  Discontinued        2 g 200 mL/hr over 30 Minutes Intravenous Every 12 hours 01/12/23 2209 01/13/23 0927   01/12/23 2145  vancomycin (VANCOCIN) IVPB 1000 mg/200 mL premix       See Hyperspace for full Linked Orders Report.   1,000 mg 200 mL/hr over 60 Minutes Intravenous  Once 01/12/23 2143 01/13/23 0100   01/12/23 2145  vancomycin (VANCOCIN) IVPB 1000 mg/200 mL premix       See Hyperspace for full Linked  Orders Report.   1,000 mg 200 mL/hr over 60 Minutes Intravenous  Once 01/12/23 2143 01/13/23 0100   01/12/23 2045  ceFEPIme (MAXIPIME) 2 g in sodium chloride 0.9 % 100 mL IVPB        2 g  200 mL/hr over 30 Minutes Intravenous  Once 01/12/23 2039 01/12/23 2129   01/12/23 2045  metroNIDAZOLE (FLAGYL) IVPB 500 mg        500 mg 100 mL/hr over 60 Minutes Intravenous  Once 01/12/23 2039 01/12/23 2233   01/12/23 2045  vancomycin (VANCOCIN) IVPB 1000 mg/200 mL premix  Status:  Discontinued        1,000 mg 200 mL/hr over 60 Minutes Intravenous  Once 01/12/23 2039 01/12/23 2143      Culture/Microbiology    Component Value Date/Time   SDES BLOOD LEFT ANTECUBITAL 01/14/2023 0828   SDES BLOOD LEFT HAND 01/14/2023 0828   SPECREQUEST  01/14/2023 9629    BOTTLES DRAWN AEROBIC ONLY Blood Culture results may not be optimal due to an inadequate volume of blood received in culture bottles   SPECREQUEST  01/14/2023 0828    BOTTLES DRAWN AEROBIC ONLY Blood Culture results may not be optimal due to an inadequate volume of blood received in culture bottles   CULT  01/14/2023 0828    NO GROWTH 4 DAYS Performed at [facility], [address], [place], [place] [address]    CULT  01/14/2023 [phone]    NO GROWTH 4 DAYS Performed at [facility], [address], [place], [place] [address]    REPTSTATUS PENDING 01/14/2023 2536   REPTSTATUS PENDING 01/14/2023 6440  Radiology Studies: No results found.   LOS: 5 days   Lanae Boast, MD [facility]  01/18/2023, 11:20 AM

## 2023-01-18 NOTE — Progress Notes (Signed)
   01/18/23 0036  BiPAP/CPAP/SIPAP  $ Face Mask Large  Yes  Flow Rate 4 lpm

## 2023-01-19 DIAGNOSIS — I2729 Other secondary pulmonary hypertension: Secondary | ICD-10-CM | POA: Diagnosis not present

## 2023-01-19 DIAGNOSIS — B379 Candidiasis, unspecified: Secondary | ICD-10-CM | POA: Diagnosis not present

## 2023-01-19 DIAGNOSIS — J9601 Acute respiratory failure with hypoxia: Secondary | ICD-10-CM | POA: Diagnosis not present

## 2023-01-19 DIAGNOSIS — N179 Acute kidney failure, unspecified: Secondary | ICD-10-CM | POA: Diagnosis not present

## 2023-01-19 DIAGNOSIS — T83511A Infection and inflammatory reaction due to indwelling urethral catheter, initial encounter: Secondary | ICD-10-CM | POA: Diagnosis not present

## 2023-01-19 DIAGNOSIS — E119 Type 2 diabetes mellitus without complications: Secondary | ICD-10-CM | POA: Diagnosis not present

## 2023-01-19 DIAGNOSIS — R7881 Bacteremia: Secondary | ICD-10-CM | POA: Diagnosis not present

## 2023-01-19 DIAGNOSIS — N401 Enlarged prostate with lower urinary tract symptoms: Secondary | ICD-10-CM | POA: Diagnosis not present

## 2023-01-19 DIAGNOSIS — L89890 Pressure ulcer of other site, unstageable: Secondary | ICD-10-CM | POA: Diagnosis not present

## 2023-01-19 DIAGNOSIS — I4821 Permanent atrial fibrillation: Secondary | ICD-10-CM | POA: Diagnosis not present

## 2023-01-19 DIAGNOSIS — J810 Acute pulmonary edema: Secondary | ICD-10-CM | POA: Diagnosis not present

## 2023-01-19 DIAGNOSIS — R350 Frequency of micturition: Secondary | ICD-10-CM | POA: Diagnosis not present

## 2023-01-19 DIAGNOSIS — Z743 Need for continuous supervision: Secondary | ICD-10-CM | POA: Diagnosis not present

## 2023-01-19 DIAGNOSIS — I5033 Acute on chronic diastolic (congestive) heart failure: Secondary | ICD-10-CM | POA: Diagnosis not present

## 2023-01-19 DIAGNOSIS — I480 Paroxysmal atrial fibrillation: Secondary | ICD-10-CM | POA: Diagnosis not present

## 2023-01-19 DIAGNOSIS — R339 Retention of urine, unspecified: Secondary | ICD-10-CM | POA: Diagnosis not present

## 2023-01-19 DIAGNOSIS — R2681 Unsteadiness on feet: Secondary | ICD-10-CM | POA: Diagnosis not present

## 2023-01-19 DIAGNOSIS — Z7189 Other specified counseling: Secondary | ICD-10-CM | POA: Diagnosis not present

## 2023-01-19 DIAGNOSIS — R319 Hematuria, unspecified: Secondary | ICD-10-CM | POA: Diagnosis not present

## 2023-01-19 DIAGNOSIS — M6281 Muscle weakness (generalized): Secondary | ICD-10-CM | POA: Diagnosis not present

## 2023-01-19 DIAGNOSIS — E46 Unspecified protein-calorie malnutrition: Secondary | ICD-10-CM | POA: Diagnosis not present

## 2023-01-19 DIAGNOSIS — Z9181 History of falling: Secondary | ICD-10-CM | POA: Diagnosis not present

## 2023-01-19 DIAGNOSIS — M19019 Primary osteoarthritis, unspecified shoulder: Secondary | ICD-10-CM | POA: Insufficient documentation

## 2023-01-19 DIAGNOSIS — I5032 Chronic diastolic (congestive) heart failure: Secondary | ICD-10-CM | POA: Diagnosis not present

## 2023-01-19 DIAGNOSIS — R601 Generalized edema: Secondary | ICD-10-CM | POA: Diagnosis not present

## 2023-01-19 DIAGNOSIS — I272 Pulmonary hypertension, unspecified: Secondary | ICD-10-CM | POA: Diagnosis not present

## 2023-01-19 DIAGNOSIS — J449 Chronic obstructive pulmonary disease, unspecified: Secondary | ICD-10-CM | POA: Diagnosis not present

## 2023-01-19 DIAGNOSIS — D631 Anemia in chronic kidney disease: Secondary | ICD-10-CM | POA: Diagnosis not present

## 2023-01-19 DIAGNOSIS — I10 Essential (primary) hypertension: Secondary | ICD-10-CM | POA: Diagnosis not present

## 2023-01-19 DIAGNOSIS — S22088D Other fracture of T11-T12 vertebra, subsequent encounter for fracture with routine healing: Secondary | ICD-10-CM | POA: Diagnosis not present

## 2023-01-19 DIAGNOSIS — I509 Heart failure, unspecified: Secondary | ICD-10-CM | POA: Diagnosis not present

## 2023-01-19 DIAGNOSIS — R6889 Other general symptoms and signs: Secondary | ICD-10-CM | POA: Diagnosis not present

## 2023-01-19 DIAGNOSIS — E7849 Other hyperlipidemia: Secondary | ICD-10-CM | POA: Diagnosis not present

## 2023-01-19 DIAGNOSIS — E662 Morbid (severe) obesity with alveolar hypoventilation: Secondary | ICD-10-CM | POA: Diagnosis present

## 2023-01-19 DIAGNOSIS — N3000 Acute cystitis without hematuria: Secondary | ICD-10-CM | POA: Diagnosis not present

## 2023-01-19 DIAGNOSIS — I5082 Biventricular heart failure: Secondary | ICD-10-CM | POA: Diagnosis not present

## 2023-01-19 DIAGNOSIS — R0689 Other abnormalities of breathing: Secondary | ICD-10-CM | POA: Diagnosis not present

## 2023-01-19 DIAGNOSIS — J9621 Acute and chronic respiratory failure with hypoxia: Secondary | ICD-10-CM | POA: Diagnosis not present

## 2023-01-19 DIAGNOSIS — M545 Low back pain, unspecified: Secondary | ICD-10-CM | POA: Diagnosis not present

## 2023-01-19 DIAGNOSIS — R627 Adult failure to thrive: Secondary | ICD-10-CM | POA: Diagnosis not present

## 2023-01-19 DIAGNOSIS — E43 Unspecified severe protein-calorie malnutrition: Secondary | ICD-10-CM | POA: Diagnosis not present

## 2023-01-19 DIAGNOSIS — G9341 Metabolic encephalopathy: Secondary | ICD-10-CM | POA: Diagnosis not present

## 2023-01-19 DIAGNOSIS — E1122 Type 2 diabetes mellitus with diabetic chronic kidney disease: Secondary | ICD-10-CM | POA: Diagnosis not present

## 2023-01-19 DIAGNOSIS — I130 Hypertensive heart and chronic kidney disease with heart failure and stage 1 through stage 4 chronic kidney disease, or unspecified chronic kidney disease: Secondary | ICD-10-CM | POA: Diagnosis not present

## 2023-01-19 DIAGNOSIS — M549 Dorsalgia, unspecified: Secondary | ICD-10-CM | POA: Diagnosis not present

## 2023-01-19 DIAGNOSIS — J9612 Chronic respiratory failure with hypercapnia: Secondary | ICD-10-CM | POA: Diagnosis not present

## 2023-01-19 DIAGNOSIS — A498 Other bacterial infections of unspecified site: Secondary | ICD-10-CM | POA: Insufficient documentation

## 2023-01-19 DIAGNOSIS — Z1152 Encounter for screening for COVID-19: Secondary | ICD-10-CM | POA: Diagnosis not present

## 2023-01-19 DIAGNOSIS — R0989 Other specified symptoms and signs involving the circulatory and respiratory systems: Secondary | ICD-10-CM | POA: Diagnosis not present

## 2023-01-19 DIAGNOSIS — L89313 Pressure ulcer of right buttock, stage 3: Secondary | ICD-10-CM | POA: Diagnosis not present

## 2023-01-19 DIAGNOSIS — N390 Urinary tract infection, site not specified: Secondary | ICD-10-CM | POA: Diagnosis not present

## 2023-01-19 DIAGNOSIS — I482 Chronic atrial fibrillation, unspecified: Secondary | ICD-10-CM | POA: Diagnosis not present

## 2023-01-19 DIAGNOSIS — N1832 Chronic kidney disease, stage 3b: Secondary | ICD-10-CM | POA: Diagnosis not present

## 2023-01-19 DIAGNOSIS — Z7401 Bed confinement status: Secondary | ICD-10-CM | POA: Diagnosis not present

## 2023-01-19 DIAGNOSIS — F0390 Unspecified dementia without behavioral disturbance: Secondary | ICD-10-CM | POA: Diagnosis present

## 2023-01-19 DIAGNOSIS — E669 Obesity, unspecified: Secondary | ICD-10-CM | POA: Diagnosis not present

## 2023-01-19 DIAGNOSIS — I50811 Acute right heart failure: Secondary | ICD-10-CM | POA: Diagnosis not present

## 2023-01-19 DIAGNOSIS — E1169 Type 2 diabetes mellitus with other specified complication: Secondary | ICD-10-CM | POA: Diagnosis not present

## 2023-01-19 DIAGNOSIS — J9602 Acute respiratory failure with hypercapnia: Secondary | ICD-10-CM | POA: Diagnosis not present

## 2023-01-19 DIAGNOSIS — J9611 Chronic respiratory failure with hypoxia: Secondary | ICD-10-CM | POA: Diagnosis not present

## 2023-01-19 DIAGNOSIS — R2689 Other abnormalities of gait and mobility: Secondary | ICD-10-CM | POA: Diagnosis not present

## 2023-01-19 DIAGNOSIS — I499 Cardiac arrhythmia, unspecified: Secondary | ICD-10-CM | POA: Diagnosis not present

## 2023-01-19 DIAGNOSIS — N281 Cyst of kidney, acquired: Secondary | ICD-10-CM | POA: Diagnosis not present

## 2023-01-19 DIAGNOSIS — N189 Chronic kidney disease, unspecified: Secondary | ICD-10-CM | POA: Diagnosis not present

## 2023-01-19 DIAGNOSIS — Z9981 Dependence on supplemental oxygen: Secondary | ICD-10-CM | POA: Diagnosis not present

## 2023-01-19 DIAGNOSIS — I110 Hypertensive heart disease with heart failure: Secondary | ICD-10-CM | POA: Diagnosis not present

## 2023-01-19 DIAGNOSIS — R4182 Altered mental status, unspecified: Secondary | ICD-10-CM | POA: Diagnosis present

## 2023-01-19 DIAGNOSIS — A419 Sepsis, unspecified organism: Secondary | ICD-10-CM | POA: Diagnosis not present

## 2023-01-19 DIAGNOSIS — E785 Hyperlipidemia, unspecified: Secondary | ICD-10-CM | POA: Diagnosis not present

## 2023-01-19 DIAGNOSIS — X58XXXA Exposure to other specified factors, initial encounter: Secondary | ICD-10-CM | POA: Diagnosis present

## 2023-01-19 DIAGNOSIS — J9622 Acute and chronic respiratory failure with hypercapnia: Secondary | ICD-10-CM | POA: Diagnosis not present

## 2023-01-19 DIAGNOSIS — R1312 Dysphagia, oropharyngeal phase: Secondary | ICD-10-CM | POA: Diagnosis not present

## 2023-01-19 LAB — GLUCOSE, CAPILLARY
Glucose-Capillary: 106 mg/dL — ABNORMAL HIGH (ref 70–99)
Glucose-Capillary: 141 mg/dL — ABNORMAL HIGH (ref 70–99)

## 2023-01-19 LAB — CBC
HCT: 33.2 % — ABNORMAL LOW (ref 39.0–52.0)
Hemoglobin: 10 g/dL — ABNORMAL LOW (ref 13.0–17.0)
MCH: 28.5 pg (ref 26.0–34.0)
MCHC: 30.1 g/dL (ref 30.0–36.0)
MCV: 94.6 fL (ref 80.0–100.0)
Platelets: 305 10*3/uL (ref 150–400)
RBC: 3.51 MIL/uL — ABNORMAL LOW (ref 4.22–5.81)
RDW: 16.8 % — ABNORMAL HIGH (ref 11.5–15.5)
WBC: 14.1 10*3/uL — ABNORMAL HIGH (ref 4.0–10.5)
nRBC: 0 % (ref 0.0–0.2)

## 2023-01-19 LAB — CULTURE, BLOOD (ROUTINE X 2): Culture: NO GROWTH

## 2023-01-19 LAB — BASIC METABOLIC PANEL
Anion gap: 6 (ref 5–15)
BUN: 18 mg/dL (ref 8–23)
CO2: 26 mmol/L (ref 22–32)
Calcium: 8.6 mg/dL — ABNORMAL LOW (ref 8.9–10.3)
Chloride: 115 mmol/L — ABNORMAL HIGH (ref 98–111)
Creatinine, Ser: 1.55 mg/dL — ABNORMAL HIGH (ref 0.61–1.24)
GFR, Estimated: 46 mL/min — ABNORMAL LOW (ref >60)
Glucose, Bld: 122 mg/dL — ABNORMAL HIGH (ref 70–99)
Potassium: 3.5 mmol/L (ref 3.5–5.1)
Sodium: 147 mmol/L — ABNORMAL HIGH (ref 135–145)

## 2023-01-19 MED ORDER — TAMSULOSIN HCL 0.4 MG PO CAPS: 0.4000 mg | ORAL_CAPSULE | Freq: Every day | ORAL | Status: DC

## 2023-01-19 MED ORDER — SACCHAROMYCES BOULARDII 250 MG PO CAPS: 250.0000 mg | ORAL_CAPSULE | Freq: Two times a day (BID) | ORAL | 0 refills | Status: DC

## 2023-01-19 MED ORDER — CIPROFLOXACIN HCL 750 MG PO TABS: 750.0000 mg | ORAL_TABLET | Freq: Two times a day (BID) | ORAL | 0 refills | Status: AC

## 2023-01-19 MED ORDER — DILTIAZEM HCL ER COATED BEADS 120 MG PO CP24: 120.0000 mg | ORAL_CAPSULE | Freq: Every day | ORAL | 0 refills | Status: DC

## 2023-01-19 NOTE — Progress Notes (Signed)
   01/19/23 0036  BiPAP/CPAP/SIPAP  $ Non-Invasive Home Ventilator  Subsequent  BiPAP/CPAP/SIPAP Pt Type Adult  BiPAP/CPAP/SIPAP Resmed  Mask Type Full face mask  Mask Size Medium  IPAP 12 cmH20  EPAP 6 cmH2O  Pressure Support 6 cmH20  CPAP/SIPAP surface wiped down Yes

## 2023-01-19 NOTE — Discharge Summary (Signed)
Physician Discharge Summary  QUARAN QUERTERMOUS RUE:[id number] DOB: [birth year] DOA: 01/12/2023  PCP: Corwin Levins, MD  Admit date: 01/12/2023 Discharge date: 01/19/2023 Recommendations for Outpatient Follow-up:  Follow up with PCP in 1 weeks-call for appointment Follow-up with urology for voiding trial and Foley removal Please obtain BMP/CBC in one week  Discharge Dispo: SNF Discharge Condition: Stable Code Status:   Code Status: Full Code Diet recommendation:  Diet Order             Diet heart healthy/carb modified Room service appropriate? Yes; Fluid consistency: Thin  Diet effective now                    Brief/Interim Summary:  77 year old male with history of paroxysmal A-fib on Eliquis, chronic diastolic CHF, COPD, chronic hypoxic respiratory failure on 4 L of oxygen at home, diabetes type 2 who presented from home with complaint of fever, chills, dysuria, lower abdominal pain.  He was just discharged from rehab to home. He lives with his daughter. On presentation, he was febrile, rapid A-fib, blood pressure was low.  UA was suspicious for UTI.  CT abdomen /pelvis showed left kidney stranding, mild hydronephrosis without obstruction.  Lab work showed leukocytosis.  Patient was admitted for the management of severe sepsis secondary to UTI.  Started on broad spectrum antibiotics and treated for sepsis due to complicated UTI.  Urine and blood cultures showing Klebsiella.  Patient was transitioned to oral antibiotics 6/10 based on sensitivity to Unasyn. Other medical issues including chronic diastolic CHF hyperlipidemia A-fib BPH gout stable. Otherwise stable and pending placement Attempted voiding trial 6/10 failed Foley needed to be reinserted he will need outpatient urology follow-up in 1 week At this time remains stable, waiting for insurance approval for Snf> has been obtaining for discharge 6/12. Remains medically stable.   Discharge Diagnoses:  Principal Problem:   Complicated UTI  (urinary tract infection) Active Problems:   Hyperlipidemia   Urinary frequency   Chronic diastolic CHF (congestive heart failure) (HCC)   Chronic a-fib (HCC)   Sepsis (HCC)   Bacteremia   Sepsis with Klebsiella bacteremia Complicated UTI: Treated with ceftriaxone> transitioned to Augmentin 6/10 but given Klebsiella sensitive pattern w/ MIC for unasyn -changed to ciprofloxacin per pharmacy 01/17/23.2nd set of BCx's w NGTD.  Leukocytosis at 14.1 downtrending from 20.9, overall vitals stable, afebrile.  Repeat CBC at skilled nursing facility in 1 week. Recent Labs  Lab 01/12/23 2034 01/12/23 2237 01/14/23 0827 01/15/23 0122 01/16/23 0119 01/17/23 0117 01/18/23 0041 01/19/23 0021  WBC 18.9*  --    < > 20.9* 15.9* 14.1* 14.4* 14.1*  LATICACIDVEN 2.3* 1.3  --   --   --   --   --   --    < > = values in this interval not displayed.   BPH on Flomax Urine retention w/ JYN:WGNFAOZH creatinine ~1.2-1.4:On admission 1.9 downtrending with IV fluid hydration.  Failed voiding trial and Foley placed back in, will need outpatient follow-up with urology.  I sent message to Dr. Mena Goes with [facility] number provided for follow-up  Chronic diastolic YQM:VHQIONGEX 25 mg bid.Needing IVF for AKI> diuretics on hold. creatinine much better but still up -reassess for torsemide need for after follow-up BMP Hyperlipidemia: cont Lipitor Chronic a-fib : Rate controlled, continue on Eliquis and Lopressor and lower dose of cardizem. Mild hypernatremia: encouraged oral hydration repeat BMP in 1 week Gout on allopurinol Anemia appears chronic in 10 to 11 g range.  Monitor Fecal  incontinence/Loose stool ongoing. Monitor. Diet as tolerated. Cont Floraster.  Acute kidney injury creatinine recently around 1.16, peaked to 1.9> downtrending at 1 point 5 repeat BMP morning before resuming torsemide.   Recent Labs    12/01/22 0351 12/03/22 0648 12/06/22 1149 01/12/23 2034 01/14/23 0827 01/15/23 0122  01/16/23 0119 01/17/23 0117 01/18/23 0041 01/19/23 0021  BUN 23 14 12  37* 25* 25* 25* 22 19 18   CREATININE 1.40* 1.21 1.16 1.98* 1.97* 1.83* 1.65* 1.63* 1.47* 1.55*  CO2 30 29 29 28 26 26 27 26 26  26    Class II Obesity w/ NWG:NFAOZHY'Q Body mass index is 38.92 kg/m. : Will benefit with PCP follow-up, weight loss  healthy lifestyle. Ordered home CPAP as he takes one at home- daughter will try to bring home machine   Consults: none Subjective: Alert awake resting comfortably no new complaints  Discharge Exam: Vitals:   01/19/23 0750 01/19/23 1101  BP: 126/79 (!) 154/106  Pulse: 95 (!) 104  Resp: 20 (!) 23  Temp: 98.2 F (36.8 C) 97.8 F (36.6 C)  SpO2: 99% 99%   General: Pt is alert, awake, not in acute distress Cardiovascular: RRR, S1/S2 +, no rubs, no gallops Respiratory: CTA bilaterally, no wheezing, no rhonchi Abdominal: Soft, NT, ND, bowel sounds + Extremities: no edema, no cyanosis  Discharge Instructions  Discharge Instructions     Ambulatory referral to Urology   Complete by: As directed    For voiding trial in 1 week      Allergies as of 01/19/2023       Reactions   Codeine    Altered mental status "goofy"   Heparin Rash    Abdominal rash 01/2013        Medication List     STOP taking these medications    metFORMIN 500 MG 24 hr tablet Commonly known as: GLUCOPHAGE-XR   senna-docusate 8.6-50 MG tablet Commonly known as: Senokot-S   torsemide 20 MG tablet Commonly known as: DEMADEX       TAKE these medications    acetaminophen 500 MG tablet Commonly known as: TYLENOL Take 500-1,000 mg by mouth every 6 (six) hours as needed for moderate pain.   allopurinol 100 MG tablet Commonly known as: ZYLOPRIM TAKE 1 TABLET DAILY   atorvastatin 20 MG tablet Commonly known as: LIPITOR TAKE 1 TABLET DAILY What changed: when to take this   cholecalciferol 25 MCG (1000 UNIT) tablet Commonly known as: VITAMIN D3 Take 2,000 Units by mouth  daily.   ciprofloxacin 750 MG tablet Commonly known as: CIPRO Take 1 tablet (750 mg total) by mouth 2 (two) times daily for 18 doses.   diltiazem 120 MG 24 hr capsule Commonly known as: Cardizem CD Take 1 capsule (120 mg total) by mouth daily. What changed:  medication strength how much to take how to take this when to take this additional instructions   Eliquis 5 MG Tabs tablet Generic drug: apixaban TAKE 1 TABLET TWICE A DAY What changed: how much to take   glipiZIDE 10 MG 24 hr tablet Commonly known as: GLUCOTROL XL Take 1 tablet (10 mg total) by mouth daily with breakfast.   Lancets Misc Use as directed once daily  E11.9   metoprolol tartrate 25 MG tablet Commonly known as: LOPRESSOR TAKE 1 TABLET(25 MG) BY MOUTH TWICE DAILY What changed: See the new instructions.   OXYGEN Inhale 3-5 L into the lungs daily. 3L Daily  4L Resting  5L Exertion   saccharomyces boulardii 250 MG  capsule Commonly known as: Florastor Take 1 capsule (250 mg total) by mouth 2 (two) times daily for 14 days.   tamsulosin 0.4 MG Caps capsule Commonly known as: FLOMAX Take 1 capsule (0.4 mg total) by mouth daily after supper.        Follow-up Information     Corwin Levins, MD. Go on 01/25/2023.   Specialties: Internal Medicine, Radiology Why: @10 :00am Contact information: [address] [place] [address] [phone]         [facility] Follow up in 1 week(s).   Contact information: [address] [place] [address] [phone]        Corwin Levins, MD Follow up in 1 week(s).   Specialties: Internal Medicine, Radiology Contact information: [address] [place] [address] [phone]                Allergies  Allergen Reactions   Codeine     Altered mental status "goofy"   Heparin Rash     Abdominal rash 01/2013    The results of significant diagnostics from this hospitalization (including imaging,  microbiology, ancillary and laboratory) are listed below for reference.    Microbiology: Recent Results (from the past 240 hour(s))  Culture, blood (Routine x 2)     Status: Abnormal   Collection Time: 01/12/23  8:24 PM   Specimen: BLOOD LEFT ARM  Result Value Ref Range Status   Specimen Description   Final    BLOOD LEFT ARM Performed at [facility], [address] [place], [place] [address]    Special Requests   Final    BOTTLES DRAWN AEROBIC AND ANAEROBIC Blood Culture adequate volume Performed at [facility], [address] [place], [place] [address]    Culture  Setup Time   Final    GRAM NEGATIVE RODS AEROBIC BOTTLE ONLY CRITICAL RESULT CALLED TO, READ BACK BY AND VERIFIED WITH: Vivien Rossetti [id number] @1348  BY SM Performed at [facility], [address] [place], [place] [address]    Culture KLEBSIELLA PNEUMONIAE (A)  Final   Report Status 01/15/2023 FINAL  Final   Organism ID, Bacteria KLEBSIELLA PNEUMONIAE  Final      Susceptibility   Klebsiella pneumoniae - MIC*    AMPICILLIN >=32 RESISTANT Resistant     CEFEPIME <=0.12 SENSITIVE Sensitive     CEFTAZIDIME <=1 SENSITIVE Sensitive     CEFTRIAXONE <=0.25 SENSITIVE Sensitive     CIPROFLOXACIN <=0.25 SENSITIVE Sensitive     GENTAMICIN <=1 SENSITIVE Sensitive     IMIPENEM <=0.25 SENSITIVE Sensitive     TRIMETH/SULFA <=20 SENSITIVE Sensitive     AMPICILLIN/SULBACTAM 8 SENSITIVE Sensitive     PIP/TAZO <=4 SENSITIVE Sensitive     * KLEBSIELLA PNEUMONIAE  Blood Culture ID Panel (Reflexed)     Status: Abnormal   Collection Time: 01/12/23  8:24 PM  Result Value Ref Range Status   Enterococcus faecalis NOT DETECTED NOT DETECTED Final   Enterococcus Faecium NOT DETECTED NOT DETECTED Final   Listeria monocytogenes NOT DETECTED NOT DETECTED Final   Staphylococcus species NOT DETECTED NOT DETECTED Final   Staphylococcus aureus (BCID) NOT DETECTED NOT DETECTED Final   Staphylococcus epidermidis  NOT DETECTED NOT DETECTED Final   Staphylococcus lugdunensis NOT DETECTED NOT DETECTED Final   Streptococcus species NOT DETECTED NOT DETECTED Final   Streptococcus agalactiae NOT DETECTED NOT DETECTED Final   Streptococcus pneumoniae NOT DETECTED NOT DETECTED Final  Streptococcus pyogenes NOT DETECTED NOT DETECTED Final   A.calcoaceticus-baumannii NOT DETECTED NOT DETECTED Final   Bacteroides fragilis NOT DETECTED NOT DETECTED Final   Enterobacterales DETECTED (A) NOT DETECTED Final    Comment: Enterobacterales represent a large order of gram negative bacteria, not a single organism. CRITICAL RESULT CALLED TO, READ BACK BY AND VERIFIED WITH: PHARMD ELIZABETH [id number] @1348  BY SM    Enterobacter cloacae complex NOT DETECTED NOT DETECTED Final   Escherichia coli NOT DETECTED NOT DETECTED Final   Klebsiella aerogenes NOT DETECTED NOT DETECTED Final   Klebsiella oxytoca NOT DETECTED NOT DETECTED Final   Klebsiella pneumoniae DETECTED (A) NOT DETECTED Final    Comment: CRITICAL RESULT CALLED TO, READ BACK BY AND VERIFIED WITH: PHARMD ELIZABETH [id number] @1348  BY SM    Proteus species NOT DETECTED NOT DETECTED Final   Salmonella species NOT DETECTED NOT DETECTED Final   Serratia marcescens NOT DETECTED NOT DETECTED Final   Haemophilus influenzae NOT DETECTED NOT DETECTED Final   Neisseria meningitidis NOT DETECTED NOT DETECTED Final   Pseudomonas aeruginosa NOT DETECTED NOT DETECTED Final   Stenotrophomonas maltophilia NOT DETECTED NOT DETECTED Final   Candida albicans NOT DETECTED NOT DETECTED Final   Candida auris NOT DETECTED NOT DETECTED Final   Candida glabrata NOT DETECTED NOT DETECTED Final   Candida krusei NOT DETECTED NOT DETECTED Final   Candida parapsilosis NOT DETECTED NOT DETECTED Final   Candida tropicalis NOT DETECTED NOT DETECTED Final   Cryptococcus neoformans/gattii NOT DETECTED NOT DETECTED Final   CTX-M ESBL NOT DETECTED NOT DETECTED Final   Carbapenem resistance IMP  NOT DETECTED NOT DETECTED Final   Carbapenem resistance KPC NOT DETECTED NOT DETECTED Final   Carbapenem resistance NDM NOT DETECTED NOT DETECTED Final   Carbapenem resist OXA 48 LIKE NOT DETECTED NOT DETECTED Final   Carbapenem resistance VIM NOT DETECTED NOT DETECTED Final    Comment: Performed at [facility], [address] [place], [place] [address]  Culture, blood (Routine x 2)     Status: Abnormal   Collection Time: 01/12/23  8:26 PM   Specimen: BLOOD RIGHT FOREARM  Result Value Ref Range Status   Specimen Description   Final    BLOOD RIGHT FOREARM Performed at [facility], [address], [place], [place] [address]    Special Requests   Final    BOTTLES DRAWN AEROBIC AND ANAEROBIC Blood Culture results may not be optimal due to an excessive volume of blood received in culture bottles Performed at [facility], [address], [place], [place] [address]    Culture  Setup Time   Final    GRAM NEGATIVE RODS IN BOTH AEROBIC AND ANAEROBIC BOTTLES CRITICAL VALUE NOTED.  VALUE IS CONSISTENT WITH PREVIOUSLY REPORTED AND CALLED VALUE.    Culture (A)  Final    KLEBSIELLA PNEUMONIAE SUSCEPTIBILITIES PERFORMED ON PREVIOUS CULTURE WITHIN THE LAST 5 DAYS. Performed at [facility], [address] [place], [place] [address]    Report Status 01/15/2023 FINAL  Final  Resp panel by RT-PCR (RSV, Flu A&B, Covid) Anterior Nasal Swab     Status: None   Collection Time: 01/12/23  8:39 PM   Specimen: Anterior Nasal Swab  Result Value Ref Range Status   SARS Coronavirus 2 by RT PCR NEGATIVE NEGATIVE Final    Comment: (NOTE) SARS-CoV-2 target nucleic acids are NOT DETECTED.  The SARS-CoV-2 RNA is generally detectable in upper respiratory specimens during the acute phase of infection. The  lowest concentration of SARS-CoV-2 viral copies this assay can detect is 138 copies/mL. A negative result does not preclude SARS-Cov-2 infection and should not be used  as the sole basis for treatment or other patient management decisions. A negative result may occur with  improper specimen collection/handling, submission of specimen other than nasopharyngeal swab, presence of viral mutation(s) within the areas targeted by this assay, and inadequate number of viral copies(<138 copies/mL). A negative result must be combined with clinical observations, patient history, and epidemiological information. The expected result is Negative.  Fact Sheet for Patients:  BloggerCourse.com  Fact Sheet for Healthcare Providers:  SeriousBroker.it  This test is no t yet approved or cleared by the [place] FDA and  has been authorized for detection and/or diagnosis of SARS-CoV-2 by FDA under an Emergency Use Authorization (EUA). This EUA will remain  in effect (meaning this test can be used) for the duration of the COVID-19 declaration under Section 564(b)(1) of the Act, 21 U.S.C.section 360bbb-3(b)(1), unless the authorization is terminated  or revoked sooner.       Influenza A by PCR NEGATIVE NEGATIVE Final   Influenza B by PCR NEGATIVE NEGATIVE Final    Comment: (NOTE) The Xpert Xpress SARS-CoV-2/FLU/RSV plus assay is intended as an aid in the diagnosis of influenza from Nasopharyngeal swab specimens and should not be used as a sole basis for treatment. Nasal washings and aspirates are unacceptable for Xpert Xpress SARS-CoV-2/FLU/RSV testing.  Fact Sheet for Patients: BloggerCourse.com  Fact Sheet for Healthcare Providers: SeriousBroker.it  This test is not yet approved or cleared by the [place] FDA and has been authorized for detection and/or diagnosis of SARS-CoV-2 by FDA under an Emergency Use Authorization (EUA). This EUA will remain in effect (meaning this test can be used) for the duration of the COVID-19 declaration under Section 564(b)(1)  of the Act, 21 U.S.C. section 360bbb-3(b)(1), unless the authorization is terminated or revoked.     Resp Syncytial Virus by PCR NEGATIVE NEGATIVE Final    Comment: (NOTE) Fact Sheet for Patients: BloggerCourse.com  Fact Sheet for Healthcare Providers: SeriousBroker.it  This test is not yet approved or cleared by the [place] FDA and has been authorized for detection and/or diagnosis of SARS-CoV-2 by FDA under an Emergency Use Authorization (EUA). This EUA will remain in effect (meaning this test can be used) for the duration of the COVID-19 declaration under Section 564(b)(1) of the Act, 21 U.S.C. section 360bbb-3(b)(1), unless the authorization is terminated or revoked.  Performed at [facility], [address] [place], [place] [address]   Urine Culture     Status: Abnormal   Collection Time: 01/12/23 10:37 PM   Specimen: Urine, Random  Result Value Ref Range Status   Specimen Description   Final    URINE, RANDOM Performed at [facility], [address] [place], [place] [address]    Special Requests   Final    NONE Reflexed from [id number] Performed at [facility], [address], [place], [place] [address]    Culture 20,000 COLONIES/mL KLEBSIELLA PNEUMONIAE (A)  Final   Report Status 01/15/2023 FINAL  Final   Organism ID, Bacteria KLEBSIELLA PNEUMONIAE (A)  Final      Susceptibility   Klebsiella pneumoniae - MIC*    AMPICILLIN >=32 RESISTANT Resistant     CEFAZOLIN <=4 SENSITIVE Sensitive     CEFEPIME <=0.12 SENSITIVE Sensitive     CEFTRIAXONE <=0.25 SENSITIVE Sensitive     CIPROFLOXACIN <=0.25 SENSITIVE Sensitive  GENTAMICIN <=1 SENSITIVE Sensitive     IMIPENEM <=0.25 SENSITIVE Sensitive     NITROFURANTOIN 64 INTERMEDIATE Intermediate     TRIMETH/SULFA <=20 SENSITIVE Sensitive     AMPICILLIN/SULBACTAM 4 SENSITIVE Sensitive     PIP/TAZO <=4  SENSITIVE Sensitive     * 20,000 COLONIES/mL KLEBSIELLA PNEUMONIAE  Culture, blood (Routine X 2) w Reflex to ID Panel     Status: None   Collection Time: 01/14/23  8:28 AM   Specimen: BLOOD  Result Value Ref Range Status   Specimen Description BLOOD LEFT ANTECUBITAL  Final   Special Requests   Final    BOTTLES DRAWN AEROBIC ONLY Blood Culture results may not be optimal due to an inadequate volume of blood received in culture bottles   Culture   Final    NO GROWTH 5 DAYS Performed at [facility], [address], [place], [place] [address]    Report Status 01/19/2023 FINAL  Final  Culture, blood (Routine X 2) w Reflex to ID Panel     Status: None   Collection Time: 01/14/23  8:28 AM   Specimen: BLOOD LEFT HAND  Result Value Ref Range Status   Specimen Description BLOOD LEFT HAND  Final   Special Requests   Final    BOTTLES DRAWN AEROBIC ONLY Blood Culture results may not be optimal due to an inadequate volume of blood received in culture bottles   Culture   Final    NO GROWTH 5 DAYS Performed at [facility], [address] [place], [place] [address]    Report Status 01/19/2023 FINAL  Final    Procedures/Studies: DG Chest 1 View  Result Date: 01/14/2023 CLINICAL DATA:  Shortness of breath. EXAM: CHEST  1 VIEW COMPARISON:  January 12, 2023. FINDINGS: Stable cardiomegaly. Hypoinflation of the lungs is noted with minimal bibasilar subsegmental atelectasis and small pleural effusions. Bony thorax is unremarkable. IMPRESSION: Hypoinflation of the lungs with minimal bibasilar subsegmental atelectasis and small pleural effusions. Electronically Signed   By: Lupita Raider M.D.   On: 01/14/2023 08:54   CT CHEST ABDOMEN PELVIS W CONTRAST  Result Date: 01/12/2023 CLINICAL DATA:  Sepsis, fever and chills. EXAM: CT CHEST, ABDOMEN, AND PELVIS WITH CONTRAST TECHNIQUE: Multidetector CT imaging of the chest, abdomen and pelvis was performed following the standard protocol during bolus  administration of intravenous contrast. RADIATION DOSE REDUCTION: This exam was performed according to the departmental dose-optimization program which includes automated exposure control, adjustment of the mA and/or kV according to patient size and/or use of iterative reconstruction technique. CONTRAST:  70mL OMNIPAQUE IOHEXOL 300 MG/ML  SOLN COMPARISON:  None Available. FINDINGS: CT CHEST FINDINGS Cardiovascular: The heart is enlarged and there is no pericardial effusion. A few scattered coronary artery calcifications are noted. There is atherosclerotic calcification of the aorta without evidence of aneurysm. The pulmonary trunk is normal in caliber. Mediastinum/Nodes: No mediastinal or axillary lymphadenopathy. A prominent lymph node is present at the right hilum measuring 1.2 cm. The thyroid gland, trachea, and esophagus are within normal limits. Lungs/Pleura: Bronchial wall thickening is noted in the lower lobes bilaterally with patchy atelectasis or infiltrate. No effusion or pneumothorax. Musculoskeletal: Degenerative changes are present in the thoracic spine. No acute fracture. Spinal fusion hardware is noted in the thoracolumbar spine. CT ABDOMEN PELVIS FINDINGS Hepatobiliary: Subcentimeter hypodensities are present in the liver, most likely representing cysts or hemangiomas. No biliary ductal dilatation. The gallbladder is without stones. Pancreas: Unremarkable. No pancreatic ductal dilatation or surrounding  inflammatory changes. Spleen: Normal in size without focal abnormality. Adrenals/Urinary Tract: Fat attenuation nodules are present in the left adrenal gland measuring up to 1.3 cm, compatible with myelolipomas. There is a 1.8 cm nodule in the left adrenal gland measuring 1.83 cm with attenuation of 8 Hounsfield units, likely adenoma. The right adrenal gland is within normal limits. The kidneys enhance symmetrically. Multiple cysts are noted in the left kidney. No renal calculus bilaterally. No  obstructive uropathy on the right. There is mild hydroureteronephrosis on the left with perinephric and periureteral fat stranding. No significant bladder wall thickening. There is diffuse perivesicular fat stranding. Stomach/Bowel: Stomach is within normal limits. Appendix is not seen. No evidence of bowel wall thickening, distention, or inflammatory changes. No free air or pneumatosis. A few scattered diverticula are present along the colon without evidence of diverticulitis. Vascular/Lymphatic: Aortic atherosclerosis. Prominent lymph nodes are present in the retroperitoneum in the periaortic space and in the perivesicular region which may be reactive. Reproductive: The prostate gland is mildly enlarged. Other: No abdominopelvic ascites. Musculoskeletal: Degenerative changes are present in the lumbar spine and spinal fusion hardware is noted. A cystic structure is present in the subcutaneous tissues over the gluteal region on the left posteriorly, possible large sebaceous cyst. IMPRESSION: 1. Mild hydroureteronephrosis on the left with no obstructing stone. There is perinephric and periureteral fat stranding on the left with perivesicular fat stranding about the bladder and local lymphadenopathy, which may be infectious or inflammatory. 2. Bronchial wall thickening in the lower lobes bilaterally with mild atelectasis or infiltrate. 3. Left adrenal nodules, compatible with adenoma and myelolipomas. 4. Coronary artery calcifications. 5. Aortic atherosclerosis. Electronically Signed   By: Thornell Sartorius M.D.   On: 01/12/2023 22:29   CT Head Wo Contrast  Result Date: 01/12/2023 CLINICAL DATA:  Fever and chills.  Mental status change EXAM: CT HEAD WITHOUT CONTRAST TECHNIQUE: Contiguous axial images were obtained from the base of the skull through the vertex without intravenous contrast. RADIATION DOSE REDUCTION: This exam was performed according to the departmental dose-optimization program which includes automated  exposure control, adjustment of the mA and/or kV according to patient size and/or use of iterative reconstruction technique. COMPARISON:  CT head 11/18/2022 FINDINGS: Brain: No intracranial hemorrhage, mass effect, or evidence of acute infarct. No hydrocephalus. No extra-axial fluid collection. Generalized cerebral atrophy. Ill-defined hypoattenuation within the cerebral white matter is nonspecific but consistent with chronic small vessel ischemic disease. Vascular: No hyperdense vessel. Intracranial arterial calcification. Skull: No fracture or focal lesion. Sinuses/Orbits: No acute finding. Paranasal sinuses and mastoid air cells are well aerated. Other: None. IMPRESSION: 1. No evidence of acute intracranial abnormality. Electronically Signed   By: Minerva Fester M.D.   On: 01/12/2023 22:20   DG Chest Port 1 View  Result Date: 01/12/2023 CLINICAL DATA:  Shortness of breath EXAM: PORTABLE CHEST 1 VIEW COMPARISON:  Chest radiograph dated 11/22/2022 FINDINGS: Low lung volumes. Bibasilar patchy opacities. Small bilateral pleural effusions. No pneumothorax. Similar enlarged cardiomediastinal silhouette. Partially imaged lumbar spinal fixation hardware appears intact. IMPRESSION: 1. Low lung volumes with bibasilar patchy opacities, may represent atelectasis, aspiration, or pneumonia. 2. Small bilateral pleural effusions. 3. Similar cardiomegaly. Electronically Signed   By: Agustin Cree M.D.   On: 01/12/2023 21:06    Labs: BNP (last 3 results) Recent Labs    11/22/22 0342  BNP 158.4*   Basic Metabolic Panel: Recent Labs  Lab 01/13/23 1256 01/14/23 0827 01/15/23 0122 01/16/23 0119 01/17/23 0117 01/18/23 0041 01/19/23 0021  NA  --  141 141 144 144 146* 147*  K  --  3.8 4.0 3.7 3.7 3.9 3.5  CL  --  107 106 109 109 112* 115*  CO2  --  26 26 27 26 26 26   GLUCOSE  --  99 114* 155* 112* 104* 122*  BUN  --  25* 25* 25* 22 19 18   CREATININE  --  1.97* 1.83* 1.65* 1.63* 1.47* 1.55*  CALCIUM  --  8.7*  8.8* 9.0 8.9 8.8* 8.6*  MG 1.4* 1.6* 2.0  --   --   --   --   PHOS 1.7* 4.2  --   --   --   --   --    Liver Function Tests: Recent Labs  Lab 01/12/23 2034  AST 18  ALT 26  ALKPHOS 56  BILITOT 0.8  PROT 7.3  ALBUMIN 2.9*   No results for input(s): "LIPASE", "AMYLASE" in the last 168 hours. No results for input(s): "AMMONIA" in the last 168 hours. CBC: Recent Labs  Lab 01/12/23 2034 01/12/23 2128 01/15/23 0122 01/16/23 0119 01/17/23 0117 01/18/23 0041 01/19/23 0021  WBC 18.9*   < > 20.9* 15.9* 14.1* 14.4* 14.1*  NEUTROABS 15.9*  --   --   --   --   --   --   HGB 12.9*   < > 10.5* 11.1* 10.4* 10.2* 10.0*  HCT 40.0   < > 33.3* 35.2* 33.3* 33.5* 33.2*  MCV 89.5   < > 92.0 94.6 92.8 95.2 94.6  PLT 314   < > 218 212 253 273 305   < > = values in this interval not displayed.   Cardiac Enzymes: No results for input(s): "CKTOTAL", "CKMB", "CKMBINDEX", "TROPONINI" in the last 168 hours. BNP: Invalid input(s): "POCBNP" CBG: Recent Labs  Lab 01/18/23 1111 01/18/23 1645 01/18/23 2104 01/19/23 0602 01/19/23 1057  GLUCAP 107* 96 129* 106* 141*   D-Dimer No results for input(s): "DDIMER" in the last 72 hours. Hgb A1c No results for input(s): "HGBA1C" in the last 72 hours. Lipid Profile No results for input(s): "CHOL", "HDL", "LDLCALC", "TRIG", "CHOLHDL", "LDLDIRECT" in the last 72 hours. Thyroid function studies No results for input(s): "TSH", "T4TOTAL", "T3FREE", "THYROIDAB" in the last 72 hours.  Invalid input(s): "FREET3" Anemia work up No results for input(s): "VITAMINB12", "FOLATE", "FERRITIN", "TIBC", "IRON", "RETICCTPCT" in the last 72 hours. Urinalysis    Component Value Date/Time   COLORURINE ORANGE (A) 01/12/2023 2237   APPEARANCEUR CLOUDY (A) 01/12/2023 2237   LABSPEC 1.014 01/12/2023 2237   PHURINE 6.5 01/12/2023 2237   GLUCOSEU NEGATIVE 01/12/2023 2237   GLUCOSEU NEGATIVE 10/08/2022 1611   HGBUR LARGE (A) 01/12/2023 2237   BILIRUBINUR NEGATIVE  01/12/2023 2237   KETONESUR NEGATIVE 01/12/2023 2237   PROTEINUR 100 (A) 01/12/2023 2237   UROBILINOGEN 0.2 10/08/2022 1611   NITRITE POSITIVE (A) 01/12/2023 2237   LEUKOCYTESUR LARGE (A) 01/12/2023 2237   Sepsis Labs Recent Labs  Lab 01/16/23 0119 01/17/23 0117 01/18/23 0041 01/19/23 0021  WBC 15.9* 14.1* 14.4* 14.1*   Microbiology Recent Results (from the past 240 hour(s))  Culture, blood (Routine x 2)     Status: Abnormal   Collection Time: 01/12/23  8:24 PM   Specimen: BLOOD LEFT ARM  Result Value Ref Range Status   Specimen Description   Final    BLOOD LEFT ARM Performed at [facility], [address], [place], [place] [address]    Special Requests   Final    BOTTLES  DRAWN AEROBIC AND ANAEROBIC Blood Culture adequate volume Performed at [facility], [address] [place], [place] [address]    Culture  Setup Time   Final    GRAM NEGATIVE RODS AEROBIC BOTTLE ONLY CRITICAL RESULT CALLED TO, READ BACK BY AND VERIFIED WITH: Vivien Rossetti [id number] @1348  BY SM Performed at [facility], [address] [place], [place] [address]    Culture KLEBSIELLA PNEUMONIAE (A)  Final   Report Status 01/15/2023 FINAL  Final   Organism ID, Bacteria KLEBSIELLA PNEUMONIAE  Final      Susceptibility   Klebsiella pneumoniae - MIC*    AMPICILLIN >=32 RESISTANT Resistant     CEFEPIME <=0.12 SENSITIVE Sensitive     CEFTAZIDIME <=1 SENSITIVE Sensitive     CEFTRIAXONE <=0.25 SENSITIVE Sensitive     CIPROFLOXACIN <=0.25 SENSITIVE Sensitive     GENTAMICIN <=1 SENSITIVE Sensitive     IMIPENEM <=0.25 SENSITIVE Sensitive     TRIMETH/SULFA <=20 SENSITIVE Sensitive     AMPICILLIN/SULBACTAM 8 SENSITIVE Sensitive     PIP/TAZO <=4 SENSITIVE Sensitive     * KLEBSIELLA PNEUMONIAE  Blood Culture ID Panel (Reflexed)     Status: Abnormal   Collection Time: 01/12/23  8:24 PM  Result Value Ref Range Status   Enterococcus faecalis NOT DETECTED NOT DETECTED Final    Enterococcus Faecium NOT DETECTED NOT DETECTED Final   Listeria monocytogenes NOT DETECTED NOT DETECTED Final   Staphylococcus species NOT DETECTED NOT DETECTED Final   Staphylococcus aureus (BCID) NOT DETECTED NOT DETECTED Final   Staphylococcus epidermidis NOT DETECTED NOT DETECTED Final   Staphylococcus lugdunensis NOT DETECTED NOT DETECTED Final   Streptococcus species NOT DETECTED NOT DETECTED Final   Streptococcus agalactiae NOT DETECTED NOT DETECTED Final   Streptococcus pneumoniae NOT DETECTED NOT DETECTED Final   Streptococcus pyogenes NOT DETECTED NOT DETECTED Final   A.calcoaceticus-baumannii NOT DETECTED NOT DETECTED Final   Bacteroides fragilis NOT DETECTED NOT DETECTED Final   Enterobacterales DETECTED (A) NOT DETECTED Final    Comment: Enterobacterales represent a large order of gram negative bacteria, not a single organism. CRITICAL RESULT CALLED TO, READ BACK BY AND VERIFIED WITH: PHARMD ELIZABETH [id number] @1348  BY SM    Enterobacter cloacae complex NOT DETECTED NOT DETECTED Final   Escherichia coli NOT DETECTED NOT DETECTED Final   Klebsiella aerogenes NOT DETECTED NOT DETECTED Final   Klebsiella oxytoca NOT DETECTED NOT DETECTED Final   Klebsiella pneumoniae DETECTED (A) NOT DETECTED Final    Comment: CRITICAL RESULT CALLED TO, READ BACK BY AND VERIFIED WITH: PHARMD ELIZABETH [id number] @1348  BY SM    Proteus species NOT DETECTED NOT DETECTED Final   Salmonella species NOT DETECTED NOT DETECTED Final   Serratia marcescens NOT DETECTED NOT DETECTED Final   Haemophilus influenzae NOT DETECTED NOT DETECTED Final   Neisseria meningitidis NOT DETECTED NOT DETECTED Final   Pseudomonas aeruginosa NOT DETECTED NOT DETECTED Final   Stenotrophomonas maltophilia NOT DETECTED NOT DETECTED Final   Candida albicans NOT DETECTED NOT DETECTED Final   Candida auris NOT DETECTED NOT DETECTED Final   Candida glabrata NOT DETECTED NOT DETECTED Final   Candida krusei NOT DETECTED NOT  DETECTED Final   Candida parapsilosis NOT DETECTED NOT DETECTED Final   Candida tropicalis NOT DETECTED NOT DETECTED Final   Cryptococcus neoformans/gattii NOT DETECTED NOT DETECTED Final   CTX-M ESBL NOT DETECTED NOT DETECTED Final   Carbapenem resistance IMP NOT DETECTED NOT DETECTED Final   Carbapenem resistance KPC NOT DETECTED NOT DETECTED  Final   Carbapenem resistance NDM NOT DETECTED NOT DETECTED Final   Carbapenem resist OXA 48 LIKE NOT DETECTED NOT DETECTED Final   Carbapenem resistance VIM NOT DETECTED NOT DETECTED Final    Comment: Performed at [facility], [address] [place], [place] [address]  Culture, blood (Routine x 2)     Status: Abnormal   Collection Time: 01/12/23  8:26 PM   Specimen: BLOOD RIGHT FOREARM  Result Value Ref Range Status   Specimen Description   Final    BLOOD RIGHT FOREARM Performed at [facility], [address] [place], [place] [address]    Special Requests   Final    BOTTLES DRAWN AEROBIC AND ANAEROBIC Blood Culture results may not be optimal due to an excessive volume of blood received in culture bottles Performed at [facility], [address], [place], [place] [address]    Culture  Setup Time   Final    GRAM NEGATIVE RODS IN BOTH AEROBIC AND ANAEROBIC BOTTLES CRITICAL VALUE NOTED.  VALUE IS CONSISTENT WITH PREVIOUSLY REPORTED AND CALLED VALUE.    Culture (A)  Final    KLEBSIELLA PNEUMONIAE SUSCEPTIBILITIES PERFORMED ON PREVIOUS CULTURE WITHIN THE LAST 5 DAYS. Performed at [facility], [address], [place], [place] [address]    Report Status 01/15/2023 FINAL  Final  Resp panel by RT-PCR (RSV, Flu A&B, Covid) Anterior Nasal Swab     Status: None   Collection Time: 01/12/23  8:39 PM   Specimen: Anterior Nasal Swab  Result Value Ref Range Status   SARS Coronavirus 2 by RT PCR NEGATIVE NEGATIVE Final    Comment: (NOTE) SARS-CoV-2 target nucleic acids are NOT DETECTED.  The SARS-CoV-2 RNA is  generally detectable in upper respiratory specimens during the acute phase of infection. The lowest concentration of SARS-CoV-2 viral copies this assay can detect is 138 copies/mL. A negative result does not preclude SARS-Cov-2 infection and should not be used as the sole basis for treatment or other patient management decisions. A negative result may occur with  improper specimen collection/handling, submission of specimen other than nasopharyngeal swab, presence of viral mutation(s) within the areas targeted by this assay, and inadequate number of viral copies(<138 copies/mL). A negative result must be combined with clinical observations, patient history, and epidemiological information. The expected result is Negative.  Fact Sheet for Patients:  BloggerCourse.com  Fact Sheet for Healthcare Providers:  SeriousBroker.it  This test is no t yet approved or cleared by the [place] FDA and  has been authorized for detection and/or diagnosis of SARS-CoV-2 by FDA under an Emergency Use Authorization (EUA). This EUA will remain  in effect (meaning this test can be used) for the duration of the COVID-19 declaration under Section 564(b)(1) of the Act, 21 U.S.C.section 360bbb-3(b)(1), unless the authorization is terminated  or revoked sooner.       Influenza A by PCR NEGATIVE NEGATIVE Final   Influenza B by PCR NEGATIVE NEGATIVE Final    Comment: (NOTE) The Xpert Xpress SARS-CoV-2/FLU/RSV plus assay is intended as an aid in the diagnosis of influenza from Nasopharyngeal swab specimens and should not be used as a sole basis for treatment. Nasal washings and aspirates are unacceptable for Xpert Xpress SARS-CoV-2/FLU/RSV testing.  Fact Sheet for Patients: BloggerCourse.com  Fact Sheet for Healthcare Providers: SeriousBroker.it  This test is not yet approved or cleared by the [place] FDA and has been authorized for detection and/or diagnosis of SARS-CoV-2 by FDA  under an Emergency Use Authorization (EUA). This EUA will remain in effect (meaning this test can be used) for the duration of the COVID-19 declaration under Section 564(b)(1) of the Act, 21 U.S.C. section 360bbb-3(b)(1), unless the authorization is terminated or revoked.     Resp Syncytial Virus by PCR NEGATIVE NEGATIVE Final    Comment: (NOTE) Fact Sheet for Patients: BloggerCourse.com  Fact Sheet for Healthcare Providers: SeriousBroker.it  This test is not yet approved or cleared by the [place] FDA and has been authorized for detection and/or diagnosis of SARS-CoV-2 by FDA under an Emergency Use Authorization (EUA). This EUA will remain in effect (meaning this test can be used) for the duration of the COVID-19 declaration under Section 564(b)(1) of the Act, 21 U.S.C. section 360bbb-3(b)(1), unless the authorization is terminated or revoked.  Performed at [facility], [address] [place], [place] [address]   Urine Culture     Status: Abnormal   Collection Time: 01/12/23 10:37 PM   Specimen: Urine, Random  Result Value Ref Range Status   Specimen Description   Final    URINE, RANDOM Performed at [facility], [address] [place], [place] [address]    Special Requests   Final    NONE Reflexed from [id number] Performed at [facility], [address] [place], [place] [address]    Culture 20,000 COLONIES/mL KLEBSIELLA PNEUMONIAE (A)  Final   Report Status 01/15/2023 FINAL  Final   Organism ID, Bacteria KLEBSIELLA PNEUMONIAE (A)  Final      Susceptibility   Klebsiella pneumoniae - MIC*    AMPICILLIN >=32 RESISTANT Resistant     CEFAZOLIN <=4 SENSITIVE Sensitive     CEFEPIME <=0.12 SENSITIVE Sensitive     CEFTRIAXONE <=0.25 SENSITIVE Sensitive     CIPROFLOXACIN <=0.25  SENSITIVE Sensitive     GENTAMICIN <=1 SENSITIVE Sensitive     IMIPENEM <=0.25 SENSITIVE Sensitive     NITROFURANTOIN 64 INTERMEDIATE Intermediate     TRIMETH/SULFA <=20 SENSITIVE Sensitive     AMPICILLIN/SULBACTAM 4 SENSITIVE Sensitive     PIP/TAZO <=4 SENSITIVE Sensitive     * 20,000 COLONIES/mL KLEBSIELLA PNEUMONIAE  Culture, blood (Routine X 2) w Reflex to ID Panel     Status: None   Collection Time: 01/14/23  8:28 AM   Specimen: BLOOD  Result Value Ref Range Status   Specimen Description BLOOD LEFT ANTECUBITAL  Final   Special Requests   Final    BOTTLES DRAWN AEROBIC ONLY Blood Culture results may not be optimal due to an inadequate volume of blood received in culture bottles   Culture   Final    NO GROWTH 5 DAYS Performed at [facility], [address], [place], [place] [address]    Report Status 01/19/2023 FINAL  Final  Culture, blood (Routine X 2) w Reflex to ID Panel     Status: None   Collection Time: 01/14/23  8:28 AM   Specimen: BLOOD LEFT HAND  Result Value Ref Range Status   Specimen Description BLOOD LEFT HAND  Final   Special Requests   Final    BOTTLES DRAWN AEROBIC ONLY Blood Culture results may not be optimal due to an inadequate volume of blood received in culture bottles   Culture   Final    NO GROWTH 5 DAYS Performed at [facility], [address] [place], [place] [address]    Report Status 01/19/2023 FINAL  Final     Time coordinating discharge:  25 minutes  SIGNED: Lanae Boast, MD  [facility] 01/19/2023, 1:12 PM  If 7PM-7AM, please contact night-coverage [email or web address]

## 2023-01-19 NOTE — Progress Notes (Signed)
Physical Therapy Treatment Patient Details Name: [name] [name] MRN: [id number] DOB: 08-03-1946 Today's Date: 01/19/2023   History of Present Illness Pt is 77 year old presented to [facility] on  01/12/23 for weakness and fever. Pt with severe sepsis due to UTI.  PMH - T12-L1 fx with ORIF 11/23/22, morbid obesity, OSA, OHS, DM2, HTN, HLD, COPD, severe pulmonary hypertension, chronic diastolic and right-sided CHF, chronic 3 to 5 L oxygen requirement, permanent A-fib on Eliquis, migraine, arthritis, gout    PT Comments    Pt tolerated today's session fairly well, limited by fatigue but agreeable to ambulate to the chair to eat his lunch. Nurse tech present during session to provide peri-care for pt after bowel movement. Pt requires maxA for bed mobility, with modA for sit<>stand transfers from an elevated surface. Pt tolerated ambulating ~3 feet to the chair with minA and increased time, declining any further ambulation attempts at this time, HR up to 130s with ambulation but recovers well with seated rest break. Discharge recommendations remain appropriate, acute PT will continue to follow pt as appropriate.     Recommendations for follow up therapy are one component of a multi-disciplinary discharge planning process, led by the attending physician.  Recommendations may be updated based on patient status, additional functional criteria and insurance authorization.  Follow Up Recommendations  Can patient physically be transported by private vehicle: No    Assistance Recommended at Discharge Frequent or constant Supervision/Assistance  Patient can return home with the following A lot of help with walking and/or transfers;A lot of help with bathing/dressing/bathroom;Assist for transportation   Equipment Recommendations  None recommended by PT    Recommendations for Other Services       Precautions / Restrictions Precautions Precautions: Fall;Back Required Braces or Orthoses: Spinal Brace Spinal Brace:  Applied in sitting position (clam shell brace) Restrictions Weight Bearing Restrictions: No     Mobility  Bed Mobility Overal bed mobility: Needs Assistance Bed Mobility: Rolling, Sidelying to Sit Rolling: Ada assist Sidelying to sit: Cormick assist, HOB elevated       General bed mobility comments: maxA to roll for LE management and trunk support with hand over hand to reach bed rail. BLE assisted off EOB and trunk support provided for sidelying to sitting, increased time provided    Transfers Overall transfer level: Needs assistance Equipment used: Rolling walker (2 wheels) Transfers: Sit to/from Stand, Bed to chair/wheelchair/BSC Sit to Stand: From elevated surface, Mod assist   Step pivot transfers: Mod assist, From elevated surface       General transfer comment: standing twice from elevated with modA, cueing for proper hand placement with increased time to power up and steady.    Ambulation/Gait Ambulation/Gait assistance: Min assist Gait Distance (Feet): 3 Feet Assistive device: Rolling walker (2 wheels) Gait Pattern/deviations: Step-to pattern, Decreased stride length, Drifts right/left, Trunk flexed, Wide base of support Gait velocity: decreased     General Gait Details: brief ambulation trial to the chair with minA for balance and sequencing with cues for proper technique. Cued for upright posture and forward gaze, increasing B foot clearance with mobility, increased time to complete due to decreased gait speed   Stairs             Wheelchair Mobility    Modified Rankin (Stroke Patients Only)       Balance Overall balance assessment: Needs assistance Sitting-balance support: Feet supported, No upper extremity supported Sitting balance-[name] Scale: Fair     Standing balance support: Bilateral upper  extremity supported, During functional activity, Reliant on assistive device for balance Standing balance-[name] Scale: Poor Standing balance comment:  Reliant on RW                            Cognition Arousal/Alertness: Awake/alert Behavior During Therapy: Flat affect Overall Cognitive Status: No family/caregiver present to determine baseline cognitive functioning                                 General Comments: pt minimally verbal throughout session, reports feeling fatigued, but able to follow one step commands with increased time for processing and verbal cues        Exercises      General Comments General comments (skin integrity, edema, etc.): At rest, HR 80-90, SPO2 maintained in the 90s on 3L O2 [place], BP 144/90. HR up to 130s with mobility but decreases with seated rest breaks      Pertinent Vitals/Pain Pain Assessment Pain Assessment: Faces Faces Pain Scale: Hurts little more Pain Location: peri area with hygiene Pain Descriptors / Indicators: Grimacing, Guarding Pain Intervention(s): Limited activity within patient's tolerance, Monitored during session, Repositioned    Home Living                          Prior Function            PT Goals (current goals can now be found in the care plan section) Acute Rehab PT Goals Patient Stated Goal: Return home PT Goal Formulation: With patient Time For Goal Achievement: 01/28/23 Potential to Achieve Goals: Fair Progress towards PT goals: Progressing toward goals    Frequency    Min 1X/week      PT Plan Current plan remains appropriate    Co-evaluation              AM-PAC PT "6 Clicks" Mobility   Outcome Measure  Help needed turning from your back to your side while in a flat bed without using bedrails?: A Lot Help needed moving from lying on your back to sitting on the side of a flat bed without using bedrails?: A Lot Help needed moving to and from a bed to a chair (including a wheelchair)?: A Lot Help needed standing up from a chair using your arms (e.g., wheelchair or bedside chair)?: A Lot Help needed to walk in  hospital room?: A Lot Help needed climbing 3-5 steps with a railing? : Total 6 Click Score: 11    End of Session Equipment Utilized During Treatment: Oxygen;Other (comment) (Clam shell brace) Activity Tolerance: Patient tolerated treatment well;Patient limited by fatigue Patient left: in chair;with call bell/phone within reach;with chair alarm set Nurse Communication: Mobility status PT Visit Diagnosis: Other abnormalities of gait and mobility (R26.89);History of falling (Z91.81);Muscle weakness (generalized) (M62.81);Pain Pain - part of body:  (back)     Time: 1610-9604 PT Time Calculation (min) (ACUTE ONLY): 24 min  Charges:  $Therapeutic Activity: 23-37 mins                     Lindalou Hose, PT DPT Acute Rehabilitation Services Office [phone]    Leonie Man 01/19/2023, 3:30 PM

## 2023-01-19 NOTE — TOC Progression Note (Signed)
Transition of Care [facility]) - Progression Note    Patient Details  Name: [name] [name] MRN: [id number] Date of Birth: [birth year]  [facility] of [facility]) CM/SW Contact  Leander Rams, LCSW Phone Number: 01/19/2023, 10:16 AM  Clinical Narrative:    Pt daughter called CSW to notify that decision has been made to dc to [facility]. CSW reached out to [place] with [facility] and she states pt can admit today with insurance auth. CSW has started insurance auth.   Pt does have his own Bipap machine and daughter states that a relative will be able to bring it to SNF.   TOC wil continue to follow.    Expected Discharge Plan: Skilled Nursing Facility Barriers to Discharge: Continued Medical Work up  Expected Discharge Plan and Services In-house Referral: Clinical Social Work Discharge Planning Services: CM Consult Post Acute Care Choice: Home Health Living arrangements for the past 2 months: Single Family Home                 DME Arranged: N/A DME Agency: NA       HH Arranged: RN, PT, OT, Speech Therapy HH Agency: [facility] Date HH Agency Contacted: 01/14/23 Time HH Agency Contacted: 1345 Representative spoke with at [facility] Agency: Morrie Sheldon   Social Determinants of Health (SDOH) Interventions SDOH Screenings   Food Insecurity: No Food Insecurity (11/25/2022)  Housing: Low Risk  (11/25/2022)  Transportation Needs: No Transportation Needs (11/25/2022)  Utilities: Not At Risk (11/25/2022)  Depression (PHQ2-9): Low Risk  (10/08/2022)  Tobacco Use: Medium Risk (01/12/2023)    Readmission Risk Interventions    01/14/2023    1:35 PM  Readmission Risk Prevention Plan  Transportation Screening Complete  Medication Review (RN Care Manager) Complete  PCP or Specialist appointment within 3-5 days of discharge Complete  HRI or Home Care Consult Complete  Palliative Care Screening Not Applicable  Skilled Nursing Facility Not Applicable   Oletta Lamas, MSW, LCSWA,  LCASA Transitions of Care  Clinical Social Worker I

## 2023-01-19 NOTE — TOC Transition Note (Signed)
Transition of Care [facility]) - CM/SW Discharge Note   Patient Details  Name: [name] [name] MRN: [id number] Date of Birth: 11-13-1945  Transition of Care [facility]) CM/SW Contact:  Leander Rams, LCSW Phone Number: 01/19/2023, 1:33 PM   Clinical Narrative:    Patient will DC to: [facility] Anticipated DC date: 01/19/2023 Family notified: [name] Transport by: Sharin Mons   Per MD patient ready for DC to [facility]. RN, patient, patient's family, and facility notified of DC. Discharge Summary and FL2 sent to facility. RN to call report prior to discharge [phone]. DC packet on chart. Ambulance transport requested for patient.   CSW will sign off for now as social work intervention is no longer needed. Please consult [place] again if new needs arise.    Final next level of care: Skilled Nursing Facility Barriers to Discharge: No Barriers Identified   Patient Goals and CMS Choice CMS Medicare.gov Compare Post Acute Care list provided to:: Patient Represenative (must comment) Choice offered to / list presented to : Adult Children (Patients daughter [name])  Discharge Placement                Patient chooses bed at: [facility] Patient to be transferred to facility by: [facility] Name of family member notified: [name] Patient and family notified of of transfer: 01/19/23  Discharge Plan and Services Additional resources added to the After Visit Summary for   In-house Referral: Clinical Social Work Discharge Planning Services: CM Consult Post Acute Care Choice: Home Health          DME Arranged: N/A DME Agency: NA       HH Arranged: RN, PT, OT, Speech Therapy HH Agency: [facility] Date HH Agency Contacted: 01/14/23 Time HH Agency Contacted: 1345 Representative spoke with at [facility] Agency: Morrie Sheldon  Social Determinants of Health (SDOH) Interventions SDOH Screenings   Food Insecurity: No Food Insecurity (11/25/2022)  Housing: Low Risk  (11/25/2022)   Transportation Needs: No Transportation Needs (11/25/2022)  Utilities: Not At Risk (11/25/2022)  Depression (PHQ2-9): Low Risk  (10/08/2022)  Tobacco Use: Medium Risk (01/12/2023)     Readmission Risk Interventions    01/14/2023    1:35 PM  Readmission Risk Prevention Plan  Transportation Screening Complete  Medication Review (RN Care Manager) Complete  PCP or Specialist appointment within 3-5 days of discharge Complete  HRI or Home Care Consult Complete  Palliative Care Screening Not Applicable  Skilled Nursing Facility Not Applicable    Oletta Lamas, MSW, LCSWA, LCASA Transitions of Care  Clinical Social Worker I

## 2023-01-19 NOTE — Progress Notes (Signed)
Patient report given to nurse Ngoci at [facility]. Elnita Maxwell, RN

## 2023-01-20 DIAGNOSIS — N390 Urinary tract infection, site not specified: Secondary | ICD-10-CM | POA: Diagnosis not present

## 2023-01-20 DIAGNOSIS — M6281 Muscle weakness (generalized): Secondary | ICD-10-CM | POA: Diagnosis not present

## 2023-01-20 DIAGNOSIS — Z9181 History of falling: Secondary | ICD-10-CM | POA: Diagnosis not present

## 2023-01-20 DIAGNOSIS — I480 Paroxysmal atrial fibrillation: Secondary | ICD-10-CM | POA: Diagnosis not present

## 2023-01-20 DIAGNOSIS — J9611 Chronic respiratory failure with hypoxia: Secondary | ICD-10-CM | POA: Diagnosis not present

## 2023-01-20 DIAGNOSIS — J449 Chronic obstructive pulmonary disease, unspecified: Secondary | ICD-10-CM | POA: Diagnosis not present

## 2023-01-20 DIAGNOSIS — R41841 Cognitive communication deficit: Secondary | ICD-10-CM | POA: Insufficient documentation

## 2023-01-20 DIAGNOSIS — R269 Unspecified abnormalities of gait and mobility: Secondary | ICD-10-CM | POA: Insufficient documentation

## 2023-01-20 DIAGNOSIS — R2681 Unsteadiness on feet: Secondary | ICD-10-CM | POA: Diagnosis not present

## 2023-01-20 DIAGNOSIS — R1312 Dysphagia, oropharyngeal phase: Secondary | ICD-10-CM | POA: Insufficient documentation

## 2023-01-21 DIAGNOSIS — N139 Obstructive and reflux uropathy, unspecified: Secondary | ICD-10-CM | POA: Insufficient documentation

## 2023-01-24 ENCOUNTER — Other Ambulatory Visit: Payer: TRICARE For Life (TFL)

## 2023-01-24 DIAGNOSIS — I5032 Chronic diastolic (congestive) heart failure: Secondary | ICD-10-CM | POA: Diagnosis not present

## 2023-01-24 DIAGNOSIS — I482 Chronic atrial fibrillation, unspecified: Secondary | ICD-10-CM | POA: Diagnosis not present

## 2023-01-24 DIAGNOSIS — S22088D Other fracture of T11-T12 vertebra, subsequent encounter for fracture with routine healing: Secondary | ICD-10-CM | POA: Diagnosis not present

## 2023-01-24 DIAGNOSIS — J449 Chronic obstructive pulmonary disease, unspecified: Secondary | ICD-10-CM | POA: Diagnosis not present

## 2023-01-24 DIAGNOSIS — R339 Retention of urine, unspecified: Secondary | ICD-10-CM | POA: Diagnosis not present

## 2023-01-24 DIAGNOSIS — I480 Paroxysmal atrial fibrillation: Secondary | ICD-10-CM | POA: Diagnosis not present

## 2023-01-24 DIAGNOSIS — J9611 Chronic respiratory failure with hypoxia: Secondary | ICD-10-CM | POA: Diagnosis not present

## 2023-01-24 DIAGNOSIS — Z9181 History of falling: Secondary | ICD-10-CM | POA: Diagnosis not present

## 2023-01-24 DIAGNOSIS — R2681 Unsteadiness on feet: Secondary | ICD-10-CM | POA: Diagnosis not present

## 2023-01-24 DIAGNOSIS — N390 Urinary tract infection, site not specified: Secondary | ICD-10-CM | POA: Diagnosis not present

## 2023-01-24 DIAGNOSIS — L89313 Pressure ulcer of right buttock, stage 3: Secondary | ICD-10-CM | POA: Diagnosis not present

## 2023-01-24 DIAGNOSIS — M6281 Muscle weakness (generalized): Secondary | ICD-10-CM | POA: Diagnosis not present

## 2023-01-25 ENCOUNTER — Telehealth: Payer: Self-pay | Admitting: Podiatry

## 2023-01-25 ENCOUNTER — Ambulatory Visit: Payer: Medicare Other | Admitting: Internal Medicine

## 2023-01-25 DIAGNOSIS — I10 Essential (primary) hypertension: Secondary | ICD-10-CM | POA: Diagnosis not present

## 2023-01-25 NOTE — Telephone Encounter (Signed)
Lmom for pt to call back to schedule picking up shoes , NS 2x

## 2023-01-26 DIAGNOSIS — J9611 Chronic respiratory failure with hypoxia: Secondary | ICD-10-CM | POA: Diagnosis not present

## 2023-01-26 DIAGNOSIS — R339 Retention of urine, unspecified: Secondary | ICD-10-CM | POA: Diagnosis not present

## 2023-01-26 DIAGNOSIS — B379 Candidiasis, unspecified: Secondary | ICD-10-CM | POA: Diagnosis not present

## 2023-01-26 DIAGNOSIS — M545 Low back pain, unspecified: Secondary | ICD-10-CM | POA: Diagnosis not present

## 2023-01-27 ENCOUNTER — Other Ambulatory Visit: Payer: Self-pay | Admitting: *Deleted

## 2023-01-27 DIAGNOSIS — R2681 Unsteadiness on feet: Secondary | ICD-10-CM | POA: Diagnosis not present

## 2023-01-27 DIAGNOSIS — Z9181 History of falling: Secondary | ICD-10-CM | POA: Diagnosis not present

## 2023-01-27 DIAGNOSIS — M6281 Muscle weakness (generalized): Secondary | ICD-10-CM | POA: Diagnosis not present

## 2023-01-27 DIAGNOSIS — N390 Urinary tract infection, site not specified: Secondary | ICD-10-CM | POA: Diagnosis not present

## 2023-01-27 DIAGNOSIS — J449 Chronic obstructive pulmonary disease, unspecified: Secondary | ICD-10-CM | POA: Diagnosis not present

## 2023-01-27 DIAGNOSIS — J9611 Chronic respiratory failure with hypoxia: Secondary | ICD-10-CM | POA: Diagnosis not present

## 2023-01-27 DIAGNOSIS — I480 Paroxysmal atrial fibrillation: Secondary | ICD-10-CM | POA: Diagnosis not present

## 2023-01-27 NOTE — Patient Outreach (Signed)
Late entry for 01/26/23  [name] resides in [facility] skilled nursing facility. Screening for potential care coordination services as benefit of health plan and PCP.   Facility site visit to [facility]. Met with [name] [name] Child psychotherapist. [name] [name] reports [name] resides with his daughter [name] [name]. Reports daughter needs [name] to be as independent as possible prior to return home.   Went to bedside to speak with [name]. However, he was sleeping soundly. Left [facility] Care Management brochure and contact information at bedside.   Will continue to follow.    [name] Noble, MSN, RN,BSN [facility] Post Acute Care Coordinator [phone] (Direct dial)

## 2023-01-28 DIAGNOSIS — R319 Hematuria, unspecified: Secondary | ICD-10-CM | POA: Diagnosis not present

## 2023-01-30 ENCOUNTER — Emergency Department (HOSPITAL_COMMUNITY): Payer: Medicare Other

## 2023-01-30 ENCOUNTER — Inpatient Hospital Stay (HOSPITAL_COMMUNITY)
Admission: EM | Admit: 2023-01-30 | Discharge: 2023-02-15 | DRG: 698 | Disposition: A | Discharge status: Skilled Nursing Facility | Payer: Medicare Other | Source: Skilled Nursing Facility | Attending: Internal Medicine | Admitting: Internal Medicine

## 2023-01-30 DIAGNOSIS — Z87891 Personal history of nicotine dependence: Secondary | ICD-10-CM

## 2023-01-30 DIAGNOSIS — J9621 Acute and chronic respiratory failure with hypoxia: Secondary | ICD-10-CM | POA: Diagnosis present

## 2023-01-30 DIAGNOSIS — I2729 Other secondary pulmonary hypertension: Secondary | ICD-10-CM | POA: Diagnosis not present

## 2023-01-30 DIAGNOSIS — E46 Unspecified protein-calorie malnutrition: Secondary | ICD-10-CM | POA: Diagnosis not present

## 2023-01-30 DIAGNOSIS — Z888 Allergy status to other drugs, medicaments and biological substances status: Secondary | ICD-10-CM

## 2023-01-30 DIAGNOSIS — L89326 Pressure-induced deep tissue damage of left buttock: Secondary | ICD-10-CM | POA: Diagnosis not present

## 2023-01-30 DIAGNOSIS — L89890 Pressure ulcer of other site, unstageable: Secondary | ICD-10-CM | POA: Diagnosis not present

## 2023-01-30 DIAGNOSIS — F0390 Unspecified dementia without behavioral disturbance: Secondary | ICD-10-CM | POA: Diagnosis present

## 2023-01-30 DIAGNOSIS — N3000 Acute cystitis without hematuria: Secondary | ICD-10-CM

## 2023-01-30 DIAGNOSIS — N179 Acute kidney failure, unspecified: Secondary | ICD-10-CM | POA: Diagnosis not present

## 2023-01-30 DIAGNOSIS — J9601 Acute respiratory failure with hypoxia: Secondary | ICD-10-CM | POA: Diagnosis not present

## 2023-01-30 DIAGNOSIS — R1312 Dysphagia, oropharyngeal phase: Secondary | ICD-10-CM | POA: Diagnosis not present

## 2023-01-30 DIAGNOSIS — E1169 Type 2 diabetes mellitus with other specified complication: Secondary | ICD-10-CM | POA: Insufficient documentation

## 2023-01-30 DIAGNOSIS — I509 Heart failure, unspecified: Secondary | ICD-10-CM | POA: Diagnosis not present

## 2023-01-30 DIAGNOSIS — N281 Cyst of kidney, acquired: Secondary | ICD-10-CM | POA: Diagnosis not present

## 2023-01-30 DIAGNOSIS — Z79899 Other long term (current) drug therapy: Secondary | ICD-10-CM

## 2023-01-30 DIAGNOSIS — E43 Unspecified severe protein-calorie malnutrition: Secondary | ICD-10-CM | POA: Diagnosis not present

## 2023-01-30 DIAGNOSIS — N189 Chronic kidney disease, unspecified: Secondary | ICD-10-CM

## 2023-01-30 DIAGNOSIS — J9611 Chronic respiratory failure with hypoxia: Secondary | ICD-10-CM | POA: Diagnosis not present

## 2023-01-30 DIAGNOSIS — J810 Acute pulmonary edema: Secondary | ICD-10-CM | POA: Diagnosis not present

## 2023-01-30 DIAGNOSIS — E66812 Obesity, class 2: Secondary | ICD-10-CM

## 2023-01-30 DIAGNOSIS — I482 Chronic atrial fibrillation, unspecified: Secondary | ICD-10-CM | POA: Diagnosis present

## 2023-01-30 DIAGNOSIS — I4821 Permanent atrial fibrillation: Secondary | ICD-10-CM | POA: Diagnosis present

## 2023-01-30 DIAGNOSIS — R6889 Other general symptoms and signs: Secondary | ICD-10-CM | POA: Diagnosis not present

## 2023-01-30 DIAGNOSIS — I5082 Biventricular heart failure: Secondary | ICD-10-CM | POA: Diagnosis not present

## 2023-01-30 DIAGNOSIS — Z1152 Encounter for screening for COVID-19: Secondary | ICD-10-CM | POA: Diagnosis not present

## 2023-01-30 DIAGNOSIS — T83511A Infection and inflammatory reaction due to indwelling urethral catheter, initial encounter: Principal | ICD-10-CM | POA: Diagnosis present

## 2023-01-30 DIAGNOSIS — E876 Hypokalemia: Secondary | ICD-10-CM | POA: Diagnosis not present

## 2023-01-30 DIAGNOSIS — N1832 Chronic kidney disease, stage 3b: Secondary | ICD-10-CM | POA: Diagnosis present

## 2023-01-30 DIAGNOSIS — G9341 Metabolic encephalopathy: Secondary | ICD-10-CM | POA: Diagnosis not present

## 2023-01-30 DIAGNOSIS — Z803 Family history of malignant neoplasm of breast: Secondary | ICD-10-CM

## 2023-01-30 DIAGNOSIS — M109 Gout, unspecified: Secondary | ICD-10-CM | POA: Diagnosis present

## 2023-01-30 DIAGNOSIS — E669 Obesity, unspecified: Secondary | ICD-10-CM | POA: Diagnosis present

## 2023-01-30 DIAGNOSIS — E8809 Other disorders of plasma-protein metabolism, not elsewhere classified: Secondary | ICD-10-CM | POA: Diagnosis not present

## 2023-01-30 DIAGNOSIS — R4182 Altered mental status, unspecified: Secondary | ICD-10-CM | POA: Diagnosis present

## 2023-01-30 DIAGNOSIS — I5033 Acute on chronic diastolic (congestive) heart failure: Secondary | ICD-10-CM | POA: Diagnosis present

## 2023-01-30 DIAGNOSIS — I50811 Acute right heart failure: Secondary | ICD-10-CM | POA: Diagnosis not present

## 2023-01-30 DIAGNOSIS — E1122 Type 2 diabetes mellitus with diabetic chronic kidney disease: Secondary | ICD-10-CM | POA: Diagnosis present

## 2023-01-30 DIAGNOSIS — R627 Adult failure to thrive: Secondary | ICD-10-CM | POA: Diagnosis not present

## 2023-01-30 DIAGNOSIS — N401 Enlarged prostate with lower urinary tract symptoms: Secondary | ICD-10-CM | POA: Diagnosis not present

## 2023-01-30 DIAGNOSIS — R0989 Other specified symptoms and signs involving the circulatory and respiratory systems: Secondary | ICD-10-CM | POA: Diagnosis not present

## 2023-01-30 DIAGNOSIS — E662 Morbid (severe) obesity with alveolar hypoventilation: Secondary | ICD-10-CM | POA: Diagnosis present

## 2023-01-30 DIAGNOSIS — Z7984 Long term (current) use of oral hypoglycemic drugs: Secondary | ICD-10-CM

## 2023-01-30 DIAGNOSIS — R748 Abnormal levels of other serum enzymes: Secondary | ICD-10-CM

## 2023-01-30 DIAGNOSIS — Z7401 Bed confinement status: Secondary | ICD-10-CM | POA: Diagnosis not present

## 2023-01-30 DIAGNOSIS — R531 Weakness: Secondary | ICD-10-CM | POA: Diagnosis not present

## 2023-01-30 DIAGNOSIS — Z7901 Long term (current) use of anticoagulants: Secondary | ICD-10-CM

## 2023-01-30 DIAGNOSIS — Z9981 Dependence on supplemental oxygen: Secondary | ICD-10-CM | POA: Diagnosis not present

## 2023-01-30 DIAGNOSIS — M549 Dorsalgia, unspecified: Secondary | ICD-10-CM | POA: Diagnosis not present

## 2023-01-30 DIAGNOSIS — D631 Anemia in chronic kidney disease: Secondary | ICD-10-CM | POA: Diagnosis not present

## 2023-01-30 DIAGNOSIS — E119 Type 2 diabetes mellitus without complications: Secondary | ICD-10-CM | POA: Diagnosis not present

## 2023-01-30 DIAGNOSIS — J9602 Acute respiratory failure with hypercapnia: Secondary | ICD-10-CM

## 2023-01-30 DIAGNOSIS — Z885 Allergy status to narcotic agent status: Secondary | ICD-10-CM

## 2023-01-30 DIAGNOSIS — G4733 Obstructive sleep apnea (adult) (pediatric): Secondary | ICD-10-CM | POA: Diagnosis present

## 2023-01-30 DIAGNOSIS — N400 Enlarged prostate without lower urinary tract symptoms: Secondary | ICD-10-CM

## 2023-01-30 DIAGNOSIS — E7849 Other hyperlipidemia: Secondary | ICD-10-CM | POA: Diagnosis not present

## 2023-01-30 DIAGNOSIS — N390 Urinary tract infection, site not specified: Secondary | ICD-10-CM | POA: Diagnosis not present

## 2023-01-30 DIAGNOSIS — I959 Hypotension, unspecified: Secondary | ICD-10-CM | POA: Diagnosis present

## 2023-01-30 DIAGNOSIS — Z87440 Personal history of urinary (tract) infections: Secondary | ICD-10-CM

## 2023-01-30 DIAGNOSIS — Z7189 Other specified counseling: Secondary | ICD-10-CM | POA: Diagnosis not present

## 2023-01-30 DIAGNOSIS — Z8249 Family history of ischemic heart disease and other diseases of the circulatory system: Secondary | ICD-10-CM

## 2023-01-30 DIAGNOSIS — S32019D Unspecified fracture of first lumbar vertebra, subsequent encounter for fracture with routine healing: Secondary | ICD-10-CM | POA: Diagnosis not present

## 2023-01-30 DIAGNOSIS — J9622 Acute and chronic respiratory failure with hypercapnia: Secondary | ICD-10-CM | POA: Diagnosis not present

## 2023-01-30 DIAGNOSIS — I110 Hypertensive heart disease with heart failure: Secondary | ICD-10-CM | POA: Diagnosis not present

## 2023-01-30 DIAGNOSIS — I499 Cardiac arrhythmia, unspecified: Secondary | ICD-10-CM | POA: Diagnosis not present

## 2023-01-30 DIAGNOSIS — I10 Essential (primary) hypertension: Secondary | ICD-10-CM | POA: Diagnosis present

## 2023-01-30 DIAGNOSIS — E785 Hyperlipidemia, unspecified: Secondary | ICD-10-CM | POA: Diagnosis not present

## 2023-01-30 DIAGNOSIS — Z6837 Body mass index (BMI) 37.0-37.9, adult: Secondary | ICD-10-CM

## 2023-01-30 DIAGNOSIS — X58XXXA Exposure to other specified factors, initial encounter: Secondary | ICD-10-CM | POA: Diagnosis present

## 2023-01-30 DIAGNOSIS — K5900 Constipation, unspecified: Secondary | ICD-10-CM | POA: Diagnosis not present

## 2023-01-30 DIAGNOSIS — A419 Sepsis, unspecified organism: Secondary | ICD-10-CM | POA: Diagnosis present

## 2023-01-30 DIAGNOSIS — Z820 Family history of epilepsy and other diseases of the nervous system: Secondary | ICD-10-CM

## 2023-01-30 DIAGNOSIS — R0689 Other abnormalities of breathing: Secondary | ICD-10-CM | POA: Diagnosis not present

## 2023-01-30 DIAGNOSIS — R601 Generalized edema: Secondary | ICD-10-CM | POA: Diagnosis not present

## 2023-01-30 DIAGNOSIS — B379 Candidiasis, unspecified: Secondary | ICD-10-CM | POA: Diagnosis present

## 2023-01-30 DIAGNOSIS — L8990 Pressure ulcer of unspecified site, unspecified stage: Secondary | ICD-10-CM

## 2023-01-30 DIAGNOSIS — I130 Hypertensive heart and chronic kidney disease with heart failure and stage 1 through stage 4 chronic kidney disease, or unspecified chronic kidney disease: Secondary | ICD-10-CM | POA: Diagnosis present

## 2023-01-30 DIAGNOSIS — Z8042 Family history of malignant neoplasm of prostate: Secondary | ICD-10-CM

## 2023-01-30 DIAGNOSIS — M6281 Muscle weakness (generalized): Secondary | ICD-10-CM | POA: Diagnosis not present

## 2023-01-30 DIAGNOSIS — L89313 Pressure ulcer of right buttock, stage 3: Secondary | ICD-10-CM | POA: Diagnosis present

## 2023-01-30 DIAGNOSIS — K219 Gastro-esophageal reflux disease without esophagitis: Secondary | ICD-10-CM | POA: Diagnosis present

## 2023-01-30 DIAGNOSIS — Z743 Need for continuous supervision: Secondary | ICD-10-CM | POA: Diagnosis not present

## 2023-01-30 DIAGNOSIS — G8929 Other chronic pain: Secondary | ICD-10-CM | POA: Diagnosis present

## 2023-01-30 DIAGNOSIS — E538 Deficiency of other specified B group vitamins: Secondary | ICD-10-CM

## 2023-01-30 LAB — COMPREHENSIVE METABOLIC PANEL
ALT: 22 U/L (ref 0–44)
AST: 19 U/L (ref 15–41)
Albumin: <1.5 g/dL — ABNORMAL LOW (ref 3.5–5.0)
Alkaline Phosphatase: 49 U/L (ref 38–126)
Anion gap: 9 (ref 5–15)
BUN: 18 mg/dL (ref 8–23)
CO2: 31 mmol/L (ref 22–32)
Calcium: 9 mg/dL (ref 8.9–10.3)
Chloride: 103 mmol/L (ref 98–111)
Creatinine, Ser: 2.03 mg/dL — ABNORMAL HIGH (ref 0.61–1.24)
GFR, Estimated: 33 mL/min — ABNORMAL LOW (ref >60)
Glucose, Bld: 141 mg/dL — ABNORMAL HIGH (ref 70–99)
Potassium: 3.7 mmol/L (ref 3.5–5.1)
Sodium: 143 mmol/L (ref 135–145)
Total Bilirubin: 0.8 mg/dL (ref 0.3–1.2)
Total Protein: 5.6 g/dL — ABNORMAL LOW (ref 6.5–8.1)

## 2023-01-30 LAB — URINALYSIS, W/ REFLEX TO CULTURE (INFECTION SUSPECTED)
Bilirubin Urine: NEGATIVE
Glucose, UA: 50 mg/dL — AB
Ketones, ur: NEGATIVE mg/dL
Nitrite: NEGATIVE
Protein, ur: 100 mg/dL — AB
RBC / HPF: >50 RBC/hpf (ref 0–5)
Specific Gravity, Urine: 1.008 (ref 1.005–1.030)
WBC, UA: >50 WBC/hpf (ref 0–5)
pH: 6 (ref 5.0–8.0)

## 2023-01-30 LAB — TROPONIN I (HIGH SENSITIVITY): Troponin I (High Sensitivity): 7 ng/L (ref <18)

## 2023-01-30 LAB — I-STAT ARTERIAL BLOOD GAS, ED
Acid-Base Excess: 5 mmol/L — ABNORMAL HIGH (ref 0.0–2.0)
Bicarbonate: 33.1 mmol/L — ABNORMAL HIGH (ref 20.0–28.0)
Calcium, Ion: 1.36 mmol/L (ref 1.15–1.40)
HCT: 31 % — ABNORMAL LOW (ref 39.0–52.0)
Hemoglobin: 10.5 g/dL — ABNORMAL LOW (ref 13.0–17.0)
O2 Saturation: 92 %
Patient temperature: 98.8
Potassium: 3.6 mmol/L (ref 3.5–5.1)
Sodium: 143 mmol/L (ref 135–145)
TCO2: 35 mmol/L — ABNORMAL HIGH (ref 22–32)
pCO2 arterial: 71.7 mmHg (ref 32–48)
pH, Arterial: 7.273 — ABNORMAL LOW (ref 7.35–7.45)
pO2, Arterial: 75 mmHg — ABNORMAL LOW (ref 83–108)

## 2023-01-30 LAB — CBC WITH DIFFERENTIAL/PLATELET
Abs Immature Granulocytes: 0.05 10*3/uL (ref 0.00–0.07)
Basophils Absolute: 0 10*3/uL (ref 0.0–0.1)
Basophils Relative: 0 %
Eosinophils Absolute: 0.1 10*3/uL (ref 0.0–0.5)
Eosinophils Relative: 1 %
HCT: 33.7 % — ABNORMAL LOW (ref 39.0–52.0)
Hemoglobin: 10.3 g/dL — ABNORMAL LOW (ref 13.0–17.0)
Immature Granulocytes: 1 %
Lymphocytes Relative: 17 %
Lymphs Abs: 1.6 10*3/uL (ref 0.7–4.0)
MCH: 29.4 pg (ref 26.0–34.0)
MCHC: 30.6 g/dL (ref 30.0–36.0)
MCV: 96.3 fL (ref 80.0–100.0)
Monocytes Absolute: 0.5 10*3/uL (ref 0.1–1.0)
Monocytes Relative: 6 %
Neutro Abs: 6.9 10*3/uL (ref 1.7–7.7)
Neutrophils Relative %: 75 %
Platelets: 273 10*3/uL (ref 150–400)
RBC: 3.5 MIL/uL — ABNORMAL LOW (ref 4.22–5.81)
RDW: 17 % — ABNORMAL HIGH (ref 11.5–15.5)
WBC: 9.2 10*3/uL (ref 4.0–10.5)
nRBC: 0 % (ref 0.0–0.2)

## 2023-01-30 LAB — LACTIC ACID, PLASMA
Lactic Acid, Venous: 1.4 mmol/L (ref 0.5–1.9)
Lactic Acid, Venous: 1.3 mmol/L (ref 0.5–1.9)

## 2023-01-30 LAB — RESP PANEL BY RT-PCR (RSV, FLU A&B, COVID)  RVPGX2
Influenza A by PCR: NEGATIVE
Influenza B by PCR: NEGATIVE
Resp Syncytial Virus by PCR: NEGATIVE
SARS Coronavirus 2 by RT PCR: NEGATIVE

## 2023-01-30 LAB — BRAIN NATRIURETIC PEPTIDE: B Natriuretic Peptide: 230.3 pg/mL — ABNORMAL HIGH (ref 0.0–100.0)

## 2023-01-30 LAB — AMMONIA: Ammonia: 16 umol/L (ref 9–35)

## 2023-01-30 LAB — PROTIME-INR
INR: 1.9 — ABNORMAL HIGH (ref 0.8–1.2)
Prothrombin Time: 21.8 seconds — ABNORMAL HIGH (ref 11.4–15.2)

## 2023-01-30 LAB — APTT: aPTT: 40 seconds — ABNORMAL HIGH (ref 24–36)

## 2023-01-30 MED ORDER — VANCOMYCIN HCL IN DEXTROSE 1-5 GM/200ML-% IV SOLN
1000.0000 mg | Freq: Once | INTRAVENOUS | Status: AC
  Administered 2023-01-30: 1000 mg via INTRAVENOUS
  Filled 2023-01-30: qty 200

## 2023-01-30 MED ORDER — SODIUM CHLORIDE 0.9 % IV SOLN
2.0000 g | Freq: Once | INTRAVENOUS | Status: AC
  Administered 2023-01-30: 2 g via INTRAVENOUS
  Filled 2023-01-30: qty 12.5

## 2023-01-30 MED ORDER — LACTATED RINGERS IV BOLUS (SEPSIS)
500.0000 mL | Freq: Once | INTRAVENOUS | Status: AC
  Administered 2023-01-30: 500 mL via INTRAVENOUS

## 2023-01-30 MED ORDER — METHYLPREDNISOLONE SODIUM SUCC 125 MG IJ SOLR
125.0000 mg | Freq: Once | INTRAMUSCULAR | Status: AC
  Administered 2023-01-30: 125 mg via INTRAVENOUS
  Filled 2023-01-30: qty 2

## 2023-01-30 MED ORDER — SODIUM CHLORIDE 0.9 % IV SOLN
2.0000 g | Freq: Two times a day (BID) | INTRAVENOUS | Status: DC
  Administered 2023-01-31 – 2023-02-02 (×5): 2 g via INTRAVENOUS
  Filled 2023-01-30 (×5): qty 12.5

## 2023-01-30 MED ORDER — LACTATED RINGERS IV SOLN: INTRAVENOUS | Status: DC

## 2023-01-30 MED ORDER — FUROSEMIDE 10 MG/ML IJ SOLN: 40.0000 mg | Freq: Once | INTRAMUSCULAR | Status: DC

## 2023-01-30 MED ORDER — VANCOMYCIN HCL IN DEXTROSE 1-5 GM/200ML-% IV SOLN
1000.0000 mg | INTRAVENOUS | Status: DC
  Filled 2023-01-30: qty 200

## 2023-01-30 MED ORDER — METRONIDAZOLE 500 MG/100ML IV SOLN
500.0000 mg | Freq: Once | INTRAVENOUS | Status: AC
  Administered 2023-01-30: 500 mg via INTRAVENOUS
  Filled 2023-01-30: qty 100

## 2023-01-30 MED ORDER — LACTATED RINGERS IV BOLUS (SEPSIS)
1000.0000 mL | Freq: Once | INTRAVENOUS | Status: AC
  Administered 2023-01-30: 1000 mL via INTRAVENOUS

## 2023-01-30 MED ORDER — LACTATED RINGERS IV BOLUS (SEPSIS)
250.0000 mL | Freq: Once | INTRAVENOUS | Status: AC
  Administered 2023-01-30: 250 mL via INTRAVENOUS

## 2023-01-30 NOTE — Progress Notes (Signed)
Pt being followed by [facility] for Sepsis protocol. 

## 2023-01-30 NOTE — Progress Notes (Signed)
Attempted ABG x 2. Pt has pitting edema on arms and it is difficult to pinpoint the artery. Notified MD and he changed to VBG.  Nelda Marseille

## 2023-01-30 NOTE — Progress Notes (Signed)
Pharmacy Antibiotic Note  [name] [name] is a 77 y.o. male for which pharmacy has been consulted for cefepime and vancomycin dosing for sepsis.  Patient with a history of AF on Eliquis, chronic HFpEF, COPD, DM. Patient presenting with concern by facility staff for AMS, hypoxia, and hematuria.  SCr 1.55 WBC 9.2; T 98.8; HR 77; RR 28  Plan: Metronidazole per MD Cefepime 2g q12hr  Vancomycin 1000 mg q24hr (eAUC 519.1) unless change in renal function Monitor WBC, fever, renal function, cultures De-escalate when able Levels at steady state  Height: 5\' 8"  (172.7 cm) Weight: 52.6 kg (116 lb) IBW/kg (Calculated) : 68.4  Temp (24hrs), Avg:98.8 F (37.1 C), Min:98.8 F (37.1 C), Coltyn:98.8 F (37.1 C)  No results for input(s): "WBC", "CREATININE", "LATICACIDVEN", "VANCOTROUGH", "VANCOPEAK", "VANCORANDOM", "GENTTROUGH", "GENTPEAK", "GENTRANDOM", "TOBRATROUGH", "TOBRAPEAK", "TOBRARND", "AMIKACINPEAK", "AMIKACINTROU", "AMIKACIN" in the last 168 hours.  Estimated Creatinine Clearance: 29.7 mL/min (A) (by C-G formula based on SCr of 1.55 mg/dL (H)).    Allergies  Allergen Reactions   Codeine     Altered mental status "goofy"   Heparin Rash     Abdominal rash 01/2013   Microbiology results: Pending  Thank you for allowing pharmacy to be a part of this patient's care.  Delmar Landau, PharmD, BCPS 01/30/2023 8:11 PM ED Clinical Pharmacist -  [phone]

## 2023-01-30 NOTE — ED Notes (Signed)
Patient transported to CT scan . 

## 2023-01-30 NOTE — ED Provider Notes (Signed)
[place] EMERGENCY DEPARTMENT AT [facility] Provider Note   CSN: [id number] Arrival date & time: 01/30/23  1947     History  Chief Complaint  Patient presents with  . Altered Mental Status  . Hematuria    Keane D Giangregorio is a 77 y.o. male.   Altered Mental Status Hematuria     Patient has history of hyperlipidemia gout morbid obesity hypertension atrial fibrillation who presents to the ED for shortness of breath.  Patient does have history of prior smoking history.  He currently resides at a nursing facility.  At the staff facility noted the patient was having altered mental status to hypoxia and blood in his urine.  EMS found that the patient had increased oxygen requirements.  Patient also had been more listless than usual.  Patient denies any chest pain.  He admits to feeling some fatigue and shortness of breath.  Home Medications Prior to Admission medications   Medication Sig Start Date End Date Taking? Authorizing Provider  acetaminophen (TYLENOL) 500 MG tablet Take 500-1,000 mg by mouth every 6 (six) hours as needed for moderate pain.    [provider]  allopurinol (ZYLOPRIM) 100 MG tablet TAKE 1 TABLET DAILY Patient taking differently: Take 100 mg by mouth daily. 01/25/22   Corwin Levins, MD  atorvastatin (LIPITOR) 20 MG tablet TAKE 1 TABLET DAILY Patient taking differently: Take 20 mg by mouth at bedtime. 01/25/22   Corwin Levins, MD  cholecalciferol (VITAMIN D3) 25 MCG (1000 UNIT) tablet Take 2,000 Units by mouth daily.    [provider]  diltiazem (CARDIZEM CD) 120 MG 24 hr capsule Take 1 capsule (120 mg total) by mouth daily. 01/19/23 01/19/24  Lanae Boast, MD  ELIQUIS 5 MG TABS tablet TAKE 1 TABLET TWICE A DAY Patient taking differently: Take 5 mg by mouth 2 (two) times daily. 01/25/22   Lewayne Bunting, MD  glipiZIDE (GLUCOTROL XL) 10 MG 24 hr tablet Take 1 tablet (10 mg total) by mouth daily with breakfast. 07/15/22   Corwin Levins, MD   Lancets MISC Use as directed once daily  E11.9 04/19/17   Corwin Levins, MD  metoprolol tartrate (LOPRESSOR) 25 MG tablet TAKE 1 TABLET(25 MG) BY MOUTH TWICE DAILY Patient taking differently: Take 25 mg by mouth 2 (two) times daily. 07/12/22   Corwin Levins, MD  OXYGEN Inhale 3-5 L into the lungs daily. 3L Daily  4L Resting  5L Exertion    [provider]  saccharomyces boulardii (FLORASTOR) 250 MG capsule Take 1 capsule (250 mg total) by mouth 2 (two) times daily for 14 days. 01/19/23 02/02/23  Lanae Boast, MD  tamsulosin (FLOMAX) 0.4 MG CAPS capsule Take 1 capsule (0.4 mg total) by mouth daily after supper. 01/19/23   Lanae Boast, MD      Allergies    Codeine and Heparin    Review of Systems   Review of Systems  Genitourinary:  Positive for hematuria.    Physical Exam Updated Vital Signs BP 105/74   Pulse 99   Temp 98.8 F (37.1 C) (Rectal)   Resp (!) 22   Ht 1.778 m (5\' 10" )   Wt 99.8 kg   SpO2 97%   BMI 31.57 kg/m  Physical Exam Vitals and nursing note reviewed.  Constitutional:      General: He is not in acute distress.    Appearance: He is well-developed. He is ill-appearing.  HENT:     Head: Normocephalic and  atraumatic.     Right Ear: External ear normal.     Left Ear: External ear normal.  Eyes:     General: No scleral icterus.       Right eye: No discharge.        Left eye: No discharge.     Conjunctiva/sclera: Conjunctivae normal.  Neck:     Trachea: No tracheal deviation.  Cardiovascular:     Rate and Rhythm: Normal rate and regular rhythm.  Pulmonary:     Effort: Pulmonary effort is normal. No respiratory distress.     Breath sounds: Normal breath sounds. No stridor. No wheezing or rales.  Abdominal:     General: Bowel sounds are normal. There is no distension.     Palpations: Abdomen is soft.     Tenderness: There is no abdominal tenderness. There is no guarding or rebound.  Musculoskeletal:        General: No tenderness or deformity.      Cervical back: Neck supple.     Right lower leg: Edema present.     Left lower leg: Edema present.     Comments: Patient does have bilateral lower extremity edema, edema noted right upper extremity greater than left  Skin:    General: Skin is warm and dry.     Findings: No rash.  Neurological:     General: No focal deficit present.     Mental Status: He is alert.     Cranial Nerves: No cranial nerve deficit, dysarthria or facial asymmetry.     Sensory: No sensory deficit.     Motor: No abnormal muscle tone or seizure activity.     Coordination: Coordination normal.  Psychiatric:        Mood and Affect: Mood normal.     ED Results / Procedures / Treatments   Labs (all labs ordered are listed, but only abnormal results are displayed) Labs Reviewed  COMPREHENSIVE METABOLIC PANEL - Abnormal; Notable for the following components:      Result Value   Glucose, Bld 141 (*)    Creatinine, Ser 2.03 (*)    Total Protein 5.6 (*)    Albumin <1.5 (*)    GFR, Estimated 33 (*)    All other components within normal limits  CBC WITH DIFFERENTIAL/PLATELET - Abnormal; Notable for the following components:   RBC 3.50 (*)    Hemoglobin 10.3 (*)    HCT 33.7 (*)    RDW 17.0 (*)    All other components within normal limits  PROTIME-INR - Abnormal; Notable for the following components:   Prothrombin Time 21.8 (*)    INR 1.9 (*)    All other components within normal limits  APTT - Abnormal; Notable for the following components:   aPTT 40 (*)    All other components within normal limits  URINALYSIS, W/ REFLEX TO CULTURE (INFECTION SUSPECTED) - Abnormal; Notable for the following components:   Color, Urine RED (*)    APPearance HAZY (*)    Glucose, UA 50 (*)    Hgb urine dipstick MODERATE (*)    Protein, ur 100 (*)    Leukocytes,Ua MODERATE (*)    Bacteria, UA FEW (*)    All other components within normal limits  BRAIN NATRIURETIC PEPTIDE - Abnormal; Notable for the following components:   B  Natriuretic Peptide 230.3 (*)    All other components within normal limits  I-STAT ARTERIAL BLOOD GAS, ED - Abnormal; Notable for the following components:   pH,  Arterial 7.273 (*)    pCO2 arterial 71.7 (*)    pO2, Arterial 75 (*)    Bicarbonate 33.1 (*)    TCO2 35 (*)    Acid-Base Excess 5.0 (*)    HCT 31.0 (*)    Hemoglobin 10.5 (*)    All other components within normal limits  RESP PANEL BY RT-PCR (RSV, FLU A&B, COVID)  RVPGX2  CULTURE, BLOOD (ROUTINE X 2)  CULTURE, BLOOD (ROUTINE X 2)  URINE CULTURE  LACTIC ACID, PLASMA  LACTIC ACID, PLASMA  AMMONIA  I-STAT ARTERIAL BLOOD GAS, ED  TROPONIN I (HIGH SENSITIVITY)    EKG EKG Interpretation  Date/Time:  Sunday January 30 2023 20:01:38 EDT Ventricular Rate:  101 PR Interval:    QRS Duration: 93 QT Interval:  357 QTC Calculation: 483 R Axis:   67 Text Interpretation: Atrial fibrillation Low voltage, extremity leads Anteroseptal infarct, old Confirmed by Linwood Dibbles [id number]) on 01/30/2023 10:27:29 PM  Radiology US Renal  Result Date: 01/30/2023 CLINICAL DATA:  aki EXAM: RENAL / URINARY TRACT ULTRASOUND COMPLETE COMPARISON:  CT abdomen pelvis 01/12/2024 FINDINGS: Right Kidney: Renal measurements: 10.4 x 5 x 4.7 cm = volume: 127 mL. Renal cortical scarring. Echogenicity increased. No mass or hydronephrosis visualized. Left Kidney: Renal measurements: 13.5 x 6.2 x 7 cm = volume: 306 mL. Echogenicity increased. Multiple simple renal cysts as well as a single minimally complex thinly septated cyst noted-no further follow-up indicated. No solid mass or hydronephrosis visualized. Urinary bladder: Appears normal for degree of bladder distention. Other: None. IMPRESSION: Increased renal echogenicity suggestive of renal parenchymal disease. Electronically Signed   By: Tish Frederickson M.D.   On: 01/30/2023 23:32   CT Head Wo Contrast  Result Date: 01/30/2023 CLINICAL DATA:  Mental status change, unknown cause EXAM: CT HEAD WITHOUT CONTRAST  TECHNIQUE: Contiguous axial images were obtained from the base of the skull through the vertex without intravenous contrast. RADIATION DOSE REDUCTION: This exam was performed according to the departmental dose-optimization program which includes automated exposure control, adjustment of the mA and/or kV according to patient size and/or use of iterative reconstruction technique. COMPARISON:  CT head 01/12/2023 FINDINGS: Brain: Cerebral ventricle sizes are concordant with the degree of cerebral volume loss. Patchy and confluent areas of decreased attenuation are noted throughout the deep and periventricular white matter of the cerebral hemispheres bilaterally, compatible with chronic microvascular ischemic disease. No evidence of large-territorial acute infarction. No parenchymal hemorrhage. No mass lesion. No extra-axial collection. No mass effect or midline shift. No hydrocephalus. Basilar cisterns are patent. Vascular: No hyperdense vessel. Skull: No acute fracture or focal lesion. Sinuses/Orbits: Paranasal sinuses and mastoid air cells are clear. Bilateral lens replacement. Otherwise the orbits are unremarkable. Other: None. IMPRESSION: No acute intracranial abnormality. Electronically Signed   By: Tish Frederickson M.D.   On: 01/30/2023 22:07   DG Chest Port 1 View  Result Date: 01/30/2023 CLINICAL DATA:  Sepsis EXAM: PORTABLE CHEST 1 VIEW COMPARISON:  01/14/2023 FINDINGS: Single frontal view of the chest demonstrates an enlarged cardiac silhouette. There is central vascular congestion, with patchy perihilar airspace disease and small bilateral pleural effusions. No pneumothorax. No acute bony abnormalities. IMPRESSION: 1. Constellation of findings most consistent with congestive heart failure. Electronically Signed   By: Sharlet Salina M.D.   On: 01/30/2023 20:31    Procedures .Critical Care  Performed by: Linwood Dibbles, MD Authorized by: Linwood Dibbles, MD   Critical care provider statement:    Critical care  time (minutes):  45  Critical care was time spent personally by me on the following activities:  Development of treatment plan with patient or surrogate, discussions with consultants, evaluation of patient's response to treatment, examination of patient, ordering and review of laboratory studies, ordering and review of radiographic studies, ordering and performing treatments and interventions, pulse oximetry, re-evaluation of patient's condition and review of old charts     Medications Ordered in ED Medications  ceFEPIme (MAXIPIME) 2 g in sodium chloride 0.9 % 100 mL IVPB (has no administration in time range)  vancomycin (VANCOCIN) IVPB 1000 mg/200 mL premix (has no administration in time range)  lactated ringers bolus 1,000 mL (0 mLs Intravenous Stopped 01/30/23 2059)    And  lactated ringers bolus 500 mL (0 mLs Intravenous Stopped 01/30/23 2046)    And  lactated ringers bolus 250 mL (0 mLs Intravenous Stopped 01/30/23 2046)  ceFEPIme (MAXIPIME) 2 g in sodium chloride 0.9 % 100 mL IVPB (0 g Intravenous Stopped 01/30/23 2059)  metroNIDAZOLE (FLAGYL) IVPB 500 mg (0 mg Intravenous Stopped 01/30/23 2123)  vancomycin (VANCOCIN) IVPB 1000 mg/200 mL premix (0 mg Intravenous Stopped 01/30/23 2123)    ED Course/ Medical Decision Making/ A&P Clinical Course as of 01/31/23 0006  Sun Jan 30, 2023  2214 ABg shows resp acidosis [JK]  2215 COVID flu RSV negative. [JK]  2215 Urinalysis suggestive of urinary tract infection. [JK]  2215 CBC normal. [JK]  2215 CT without acute abnormalities [JK]  2216 Chest x-ray suggestive of congestive heart failure. [JK]  2303 Case discussed with hospitalist service.  Does not feel the patient is stable for admission to the hospitalist service at this time. [JK]  2313 Case discussed with Dr Marchelle Gearing.  Would repeat gas now.  If improving, could go to hospitalist service. [JK]  Mon Jan 31, 2023  0006 Repeat venous blood gas shows improvement in the pH [JK]    Clinical  Course User Index [JK] Linwood Dibbles, MD                             Medical Decision Making Frontal diagnosis includes but not limited to, CHF, pneumonia, sepsis  Amount and/or Complexity of Data Reviewed Labs: ordered. Radiology: ordered. ECG/medicine tests: ordered.  Risk Prescription drug management.   Patient presented to the ED for evaluation of shortness of breath.  Noted to be hypotensive on arrival.  Presentation concerning for the possibility of sepsis.  Patient was started empirically on antibiotics and given a fluid bolus.  Patient's initial ED workup shows a normal lactic acid level.  Creatinine is at any compared to previous.  Urinalysis does suggest infection although he does have an indwelling catheter.  Patient has been given antibiotics.  CT scan does not show any acute abnormalities.  Chest x-ray is more suggestive of CHF however bnp not significantly elevated.  Patient's ABG also is suggestive of respiratory acidosis.  Patient's blood pressure is improved.  He is more alert and talkative.  Will start him on BiPAP.  Family states he normally is on CPAP at night.  There may be a component of infection of this point his respiratory difficulty may be more COPD and CHF.  Will plan on admission to the hospital for further treatment.        Final Clinical Impression(s) / ED Diagnoses Final diagnoses:  Acute respiratory failure with hypoxia and hypercapnia (HCC)  Acute cystitis without hematuria  Elevated creatine kinase    Rx / DC  Orders ED Discharge Orders     None         Linwood Dibbles, MD 01/30/23 2342

## 2023-01-30 NOTE — ED Triage Notes (Signed)
PT arrives via EMs from [facility]. Staff there called out because patient noted to have AMS, hypoxia, and hematuria in indewelling urinary catheter. Pt c/o back pain from injury a couple of weeks ago. Afib 80-90, RR 24, ETCO2 47, CBG 163. Sp02 on 8L 95%, normally 95% on 4L however found by EMS on no oxygen on arrival to scene. Pt given tramadol by NH, PTA for back pain.

## 2023-01-31 ENCOUNTER — Encounter (HOSPITAL_COMMUNITY): Payer: Self-pay | Admitting: Family Medicine

## 2023-01-31 DIAGNOSIS — E662 Morbid (severe) obesity with alveolar hypoventilation: Secondary | ICD-10-CM | POA: Diagnosis present

## 2023-01-31 DIAGNOSIS — F0390 Unspecified dementia without behavioral disturbance: Secondary | ICD-10-CM | POA: Diagnosis present

## 2023-01-31 DIAGNOSIS — I482 Chronic atrial fibrillation, unspecified: Secondary | ICD-10-CM | POA: Diagnosis not present

## 2023-01-31 DIAGNOSIS — N3000 Acute cystitis without hematuria: Secondary | ICD-10-CM | POA: Diagnosis not present

## 2023-01-31 DIAGNOSIS — R627 Adult failure to thrive: Secondary | ICD-10-CM | POA: Diagnosis present

## 2023-01-31 DIAGNOSIS — J9622 Acute and chronic respiratory failure with hypercapnia: Secondary | ICD-10-CM

## 2023-01-31 DIAGNOSIS — T83511A Infection and inflammatory reaction due to indwelling urethral catheter, initial encounter: Secondary | ICD-10-CM | POA: Diagnosis present

## 2023-01-31 DIAGNOSIS — E119 Type 2 diabetes mellitus without complications: Secondary | ICD-10-CM

## 2023-01-31 DIAGNOSIS — X58XXXA Exposure to other specified factors, initial encounter: Secondary | ICD-10-CM | POA: Diagnosis present

## 2023-01-31 DIAGNOSIS — N189 Chronic kidney disease, unspecified: Secondary | ICD-10-CM | POA: Diagnosis not present

## 2023-01-31 DIAGNOSIS — I10 Essential (primary) hypertension: Secondary | ICD-10-CM

## 2023-01-31 DIAGNOSIS — Z1152 Encounter for screening for COVID-19: Secondary | ICD-10-CM | POA: Diagnosis not present

## 2023-01-31 DIAGNOSIS — L89313 Pressure ulcer of right buttock, stage 3: Secondary | ICD-10-CM | POA: Diagnosis present

## 2023-01-31 DIAGNOSIS — E7849 Other hyperlipidemia: Secondary | ICD-10-CM | POA: Diagnosis not present

## 2023-01-31 DIAGNOSIS — I130 Hypertensive heart and chronic kidney disease with heart failure and stage 1 through stage 4 chronic kidney disease, or unspecified chronic kidney disease: Secondary | ICD-10-CM | POA: Diagnosis present

## 2023-01-31 DIAGNOSIS — Z9981 Dependence on supplemental oxygen: Secondary | ICD-10-CM | POA: Diagnosis not present

## 2023-01-31 DIAGNOSIS — E785 Hyperlipidemia, unspecified: Secondary | ICD-10-CM

## 2023-01-31 DIAGNOSIS — I4821 Permanent atrial fibrillation: Secondary | ICD-10-CM | POA: Diagnosis present

## 2023-01-31 DIAGNOSIS — D631 Anemia in chronic kidney disease: Secondary | ICD-10-CM | POA: Diagnosis present

## 2023-01-31 DIAGNOSIS — R601 Generalized edema: Secondary | ICD-10-CM | POA: Diagnosis not present

## 2023-01-31 DIAGNOSIS — J9621 Acute and chronic respiratory failure with hypoxia: Secondary | ICD-10-CM | POA: Insufficient documentation

## 2023-01-31 DIAGNOSIS — I2729 Other secondary pulmonary hypertension: Secondary | ICD-10-CM | POA: Diagnosis present

## 2023-01-31 DIAGNOSIS — A419 Sepsis, unspecified organism: Secondary | ICD-10-CM | POA: Diagnosis present

## 2023-01-31 DIAGNOSIS — R1312 Dysphagia, oropharyngeal phase: Secondary | ICD-10-CM | POA: Diagnosis not present

## 2023-01-31 DIAGNOSIS — N400 Enlarged prostate without lower urinary tract symptoms: Secondary | ICD-10-CM

## 2023-01-31 DIAGNOSIS — Z7189 Other specified counseling: Secondary | ICD-10-CM | POA: Diagnosis not present

## 2023-01-31 DIAGNOSIS — J9601 Acute respiratory failure with hypoxia: Secondary | ICD-10-CM | POA: Diagnosis not present

## 2023-01-31 DIAGNOSIS — N390 Urinary tract infection, site not specified: Secondary | ICD-10-CM | POA: Diagnosis present

## 2023-01-31 DIAGNOSIS — G9341 Metabolic encephalopathy: Secondary | ICD-10-CM | POA: Diagnosis present

## 2023-01-31 DIAGNOSIS — I50811 Acute right heart failure: Secondary | ICD-10-CM | POA: Diagnosis not present

## 2023-01-31 DIAGNOSIS — N179 Acute kidney failure, unspecified: Secondary | ICD-10-CM | POA: Diagnosis present

## 2023-01-31 DIAGNOSIS — E43 Unspecified severe protein-calorie malnutrition: Secondary | ICD-10-CM | POA: Diagnosis present

## 2023-01-31 DIAGNOSIS — R4182 Altered mental status, unspecified: Secondary | ICD-10-CM | POA: Diagnosis present

## 2023-01-31 DIAGNOSIS — J9611 Chronic respiratory failure with hypoxia: Secondary | ICD-10-CM | POA: Diagnosis not present

## 2023-01-31 DIAGNOSIS — I5033 Acute on chronic diastolic (congestive) heart failure: Secondary | ICD-10-CM | POA: Diagnosis present

## 2023-01-31 DIAGNOSIS — L89890 Pressure ulcer of other site, unstageable: Secondary | ICD-10-CM | POA: Diagnosis present

## 2023-01-31 DIAGNOSIS — E46 Unspecified protein-calorie malnutrition: Secondary | ICD-10-CM | POA: Diagnosis not present

## 2023-01-31 DIAGNOSIS — J810 Acute pulmonary edema: Secondary | ICD-10-CM | POA: Diagnosis not present

## 2023-01-31 DIAGNOSIS — E1169 Type 2 diabetes mellitus with other specified complication: Secondary | ICD-10-CM | POA: Insufficient documentation

## 2023-01-31 DIAGNOSIS — J9602 Acute respiratory failure with hypercapnia: Secondary | ICD-10-CM

## 2023-01-31 DIAGNOSIS — N1832 Chronic kidney disease, stage 3b: Secondary | ICD-10-CM | POA: Diagnosis present

## 2023-01-31 DIAGNOSIS — S32019D Unspecified fracture of first lumbar vertebra, subsequent encounter for fracture with routine healing: Secondary | ICD-10-CM | POA: Diagnosis not present

## 2023-01-31 DIAGNOSIS — E1122 Type 2 diabetes mellitus with diabetic chronic kidney disease: Secondary | ICD-10-CM | POA: Diagnosis present

## 2023-01-31 DIAGNOSIS — I5082 Biventricular heart failure: Secondary | ICD-10-CM | POA: Diagnosis present

## 2023-01-31 DIAGNOSIS — M6281 Muscle weakness (generalized): Secondary | ICD-10-CM | POA: Diagnosis not present

## 2023-01-31 LAB — I-STAT VENOUS BLOOD GAS, ED
Acid-Base Excess: 6 mmol/L — ABNORMAL HIGH (ref 0.0–2.0)
Acid-Base Excess: 7 mmol/L — ABNORMAL HIGH (ref 0.0–2.0)
Bicarbonate: 31.8 mmol/L — ABNORMAL HIGH (ref 20.0–28.0)
Bicarbonate: 35.4 mmol/L — ABNORMAL HIGH (ref 20.0–28.0)
Calcium, Ion: 1.24 mmol/L (ref 1.15–1.40)
Calcium, Ion: 1.26 mmol/L (ref 1.15–1.40)
HCT: 29 % — ABNORMAL LOW (ref 39.0–52.0)
HCT: 33 % — ABNORMAL LOW (ref 39.0–52.0)
Hemoglobin: 11.2 g/dL — ABNORMAL LOW (ref 13.0–17.0)
Hemoglobin: 9.9 g/dL — ABNORMAL LOW (ref 13.0–17.0)
O2 Saturation: 85 %
O2 Saturation: 98 %
Potassium: 3.6 mmol/L (ref 3.5–5.1)
Potassium: 4.3 mmol/L (ref 3.5–5.1)
Sodium: 143 mmol/L (ref 135–145)
Sodium: 143 mmol/L (ref 135–145)
TCO2: 33 mmol/L — ABNORMAL HIGH (ref 22–32)
TCO2: 38 mmol/L — ABNORMAL HIGH (ref 22–32)
pCO2, Ven: 53.7 mmHg (ref 44–60)
pCO2, Ven: 70.6 mmHg (ref 44–60)
pH, Ven: 7.308 (ref 7.25–7.43)
pH, Ven: 7.381 (ref 7.25–7.43)
pO2, Ven: 124 mmHg — ABNORMAL HIGH (ref 32–45)
pO2, Ven: 52 mmHg — ABNORMAL HIGH (ref 32–45)

## 2023-01-31 LAB — BASIC METABOLIC PANEL
Anion gap: 6 (ref 5–15)
Anion gap: 12 (ref 5–15)
BUN: 21 mg/dL (ref 8–23)
BUN: 25 mg/dL — ABNORMAL HIGH (ref 8–23)
CO2: 31 mmol/L (ref 22–32)
CO2: 30 mmol/L (ref 22–32)
Calcium: 8.8 mg/dL — ABNORMAL LOW (ref 8.9–10.3)
Calcium: 8.9 mg/dL (ref 8.9–10.3)
Chloride: 104 mmol/L (ref 98–111)
Chloride: 100 mmol/L (ref 98–111)
Creatinine, Ser: 1.97 mg/dL — ABNORMAL HIGH (ref 0.61–1.24)
Creatinine, Ser: 1.92 mg/dL — ABNORMAL HIGH (ref 0.61–1.24)
GFR, Estimated: 34 mL/min — ABNORMAL LOW (ref >60)
GFR, Estimated: 35 mL/min — ABNORMAL LOW (ref >60)
Glucose, Bld: 196 mg/dL — ABNORMAL HIGH (ref 70–99)
Glucose, Bld: 227 mg/dL — ABNORMAL HIGH (ref 70–99)
Potassium: 4.2 mmol/L (ref 3.5–5.1)
Potassium: 4.4 mmol/L (ref 3.5–5.1)
Sodium: 141 mmol/L (ref 135–145)
Sodium: 142 mmol/L (ref 135–145)

## 2023-01-31 LAB — CBC
HCT: 33 % — ABNORMAL LOW (ref 39.0–52.0)
Hemoglobin: 10.1 g/dL — ABNORMAL LOW (ref 13.0–17.0)
MCH: 29 pg (ref 26.0–34.0)
MCHC: 30.6 g/dL (ref 30.0–36.0)
MCV: 94.8 fL (ref 80.0–100.0)
Platelets: 281 10*3/uL (ref 150–400)
RBC: 3.48 MIL/uL — ABNORMAL LOW (ref 4.22–5.81)
RDW: 16.9 % — ABNORMAL HIGH (ref 11.5–15.5)
WBC: 6.6 10*3/uL (ref 4.0–10.5)
nRBC: 0 % (ref 0.0–0.2)

## 2023-01-31 LAB — TROPONIN I (HIGH SENSITIVITY): Troponin I (High Sensitivity): 5 ng/L (ref <18)

## 2023-01-31 LAB — GLUCOSE, CAPILLARY
Glucose-Capillary: 213 mg/dL — ABNORMAL HIGH (ref 70–99)
Glucose-Capillary: 259 mg/dL — ABNORMAL HIGH (ref 70–99)

## 2023-01-31 LAB — CBG MONITORING, ED
Glucose-Capillary: 153 mg/dL — ABNORMAL HIGH (ref 70–99)
Glucose-Capillary: 160 mg/dL — ABNORMAL HIGH (ref 70–99)

## 2023-01-31 MED ORDER — INSULIN ASPART 100 UNIT/ML IJ SOLN
0.0000 [IU] | Freq: Three times a day (TID) | INTRAMUSCULAR | Status: DC
  Administered 2023-01-31: 3 [IU] via SUBCUTANEOUS
  Administered 2023-01-31 (×2): 2 [IU] via SUBCUTANEOUS
  Administered 2023-02-01: 1 [IU] via SUBCUTANEOUS
  Administered 2023-02-01: 2 [IU] via SUBCUTANEOUS
  Administered 2023-02-02: 1 [IU] via SUBCUTANEOUS
  Administered 2023-02-04: 2 [IU] via SUBCUTANEOUS
  Administered 2023-02-05: 1 [IU] via SUBCUTANEOUS
  Administered 2023-02-05 – 2023-02-06 (×2): 2 [IU] via SUBCUTANEOUS
  Administered 2023-02-06 – 2023-02-07 (×2): 1 [IU] via SUBCUTANEOUS
  Administered 2023-02-07: 2 [IU] via SUBCUTANEOUS
  Administered 2023-02-08 – 2023-02-09 (×2): 1 [IU] via SUBCUTANEOUS
  Administered 2023-02-10: 2 [IU] via SUBCUTANEOUS
  Administered 2023-02-10 – 2023-02-11 (×2): 1 [IU] via SUBCUTANEOUS
  Administered 2023-02-12: 2 [IU] via SUBCUTANEOUS
  Administered 2023-02-12 – 2023-02-13 (×3): 1 [IU] via SUBCUTANEOUS
  Administered 2023-02-13: 2 [IU] via SUBCUTANEOUS
  Administered 2023-02-14 – 2023-02-15 (×2): 1 [IU] via SUBCUTANEOUS

## 2023-01-31 MED ORDER — CYANOCOBALAMIN 1000 MCG/ML IJ SOLN
1000.0000 ug | INTRAMUSCULAR | Status: DC
  Administered 2023-02-14: 1000 ug via INTRAMUSCULAR
  Filled 2023-01-31: qty 1

## 2023-01-31 MED ORDER — METOPROLOL TARTRATE 25 MG PO TABS
25.0000 mg | ORAL_TABLET | Freq: Two times a day (BID) | ORAL | Status: DC
  Administered 2023-01-31 – 2023-02-15 (×31): 25 mg via ORAL
  Filled 2023-01-31 (×31): qty 1

## 2023-01-31 MED ORDER — ACETAMINOPHEN 500 MG PO TABS
1000.0000 mg | ORAL_TABLET | Freq: Three times a day (TID) | ORAL | Status: DC
  Administered 2023-01-31 – 2023-02-15 (×44): 1000 mg via ORAL
  Filled 2023-01-31 (×45): qty 2

## 2023-01-31 MED ORDER — ACETAMINOPHEN 325 MG PO TABS: 650.0000 mg | ORAL_TABLET | Freq: Four times a day (QID) | ORAL | Status: DC | PRN

## 2023-01-31 MED ORDER — ONDANSETRON HCL 4 MG PO TABS: 4.0000 mg | ORAL_TABLET | Freq: Four times a day (QID) | ORAL | Status: DC | PRN

## 2023-01-31 MED ORDER — ATORVASTATIN CALCIUM 10 MG PO TABS
20.0000 mg | ORAL_TABLET | Freq: Every day | ORAL | Status: DC
  Administered 2023-01-31 – 2023-02-15 (×16): 20 mg via ORAL
  Filled 2023-01-31 (×16): qty 2

## 2023-01-31 MED ORDER — FUROSEMIDE 10 MG/ML IJ SOLN
40.0000 mg | Freq: Two times a day (BID) | INTRAMUSCULAR | Status: DC
  Administered 2023-01-31 – 2023-02-01 (×3): 40 mg via INTRAVENOUS
  Filled 2023-01-31 (×3): qty 4

## 2023-01-31 MED ORDER — DILTIAZEM HCL ER COATED BEADS 120 MG PO CP24: 120.0000 mg | ORAL_CAPSULE | Freq: Every day | ORAL | Status: DC

## 2023-01-31 MED ORDER — FLUCONAZOLE 100 MG PO TABS
100.0000 mg | ORAL_TABLET | Freq: Every day | ORAL | Status: AC
  Administered 2023-01-31 – 2023-02-04 (×5): 100 mg via ORAL
  Filled 2023-01-31 (×5): qty 1

## 2023-01-31 MED ORDER — GLIPIZIDE ER 10 MG PO TB24: 10.0000 mg | ORAL_TABLET | Freq: Every day | ORAL | Status: DC

## 2023-01-31 MED ORDER — LIDOCAINE 5 % EX PTCH
2.0000 | MEDICATED_PATCH | Freq: Every day | CUTANEOUS | Status: DC
  Administered 2023-02-01 – 2023-02-14 (×14): 2 via TRANSDERMAL
  Filled 2023-01-31 (×15): qty 2

## 2023-01-31 MED ORDER — TAMSULOSIN HCL 0.4 MG PO CAPS
0.4000 mg | ORAL_CAPSULE | Freq: Every day | ORAL | Status: DC
  Administered 2023-01-31 – 2023-02-14 (×15): 0.4 mg via ORAL
  Filled 2023-01-31 (×15): qty 1

## 2023-01-31 MED ORDER — APIXABAN 5 MG PO TABS
5.0000 mg | ORAL_TABLET | Freq: Two times a day (BID) | ORAL | Status: DC
  Administered 2023-01-31 – 2023-02-15 (×31): 5 mg via ORAL
  Filled 2023-01-31 (×31): qty 1

## 2023-01-31 MED ORDER — SACCHAROMYCES BOULARDII 250 MG PO CAPS
250.0000 mg | ORAL_CAPSULE | Freq: Two times a day (BID) | ORAL | Status: DC
  Administered 2023-01-31 – 2023-02-15 (×31): 250 mg via ORAL
  Filled 2023-01-31 (×32): qty 1

## 2023-01-31 MED ORDER — INSULIN ASPART 100 UNIT/ML IJ SOLN
0.0000 [IU] | Freq: Every day | INTRAMUSCULAR | Status: DC
  Administered 2023-01-31: 3 [IU] via SUBCUTANEOUS

## 2023-01-31 MED ORDER — CHLORHEXIDINE GLUCONATE CLOTH 2 % EX PADS
6.0000 | MEDICATED_PAD | Freq: Every day | CUTANEOUS | Status: DC
  Administered 2023-01-31 – 2023-02-15 (×16): 6 via TOPICAL

## 2023-01-31 MED ORDER — TRAZODONE HCL 50 MG PO TABS: 25.0000 mg | ORAL_TABLET | Freq: Every evening | ORAL | Status: DC | PRN

## 2023-01-31 MED ORDER — ALLOPURINOL 100 MG PO TABS
100.0000 mg | ORAL_TABLET | Freq: Every day | ORAL | Status: DC
  Administered 2023-01-31 – 2023-02-15 (×16): 100 mg via ORAL
  Filled 2023-01-31 (×16): qty 1

## 2023-01-31 MED ORDER — MAGNESIUM HYDROXIDE 400 MG/5ML PO SUSP
30.0000 mL | Freq: Every day | ORAL | Status: DC | PRN
  Administered 2023-02-05 – 2023-02-06 (×2): 30 mL via ORAL
  Filled 2023-01-31 (×3): qty 30

## 2023-01-31 MED ORDER — VANCOMYCIN HCL 750 MG/150ML IV SOLN
750.0000 mg | INTRAVENOUS | Status: DC
  Administered 2023-01-31: 750 mg via INTRAVENOUS
  Filled 2023-01-31: qty 150

## 2023-01-31 MED ORDER — VITAMIN D 25 MCG (1000 UNIT) PO TABS
2000.0000 [IU] | ORAL_TABLET | Freq: Every day | ORAL | Status: DC
  Administered 2023-01-31 – 2023-02-15 (×16): 2000 [IU] via ORAL
  Filled 2023-01-31 (×16): qty 2

## 2023-01-31 MED ORDER — ONDANSETRON HCL 4 MG/2ML IJ SOLN: 4.0000 mg | Freq: Four times a day (QID) | INTRAMUSCULAR | Status: DC | PRN

## 2023-01-31 MED ORDER — ACETAMINOPHEN 650 MG RE SUPP: 650.0000 mg | Freq: Four times a day (QID) | RECTAL | Status: DC | PRN

## 2023-01-31 NOTE — Progress Notes (Signed)
Heart Failure Navigator Progress Note  Assessed for Heart & Vascular TOC clinic readiness.  Patient does not meet criteria due to EF 60-65%, per MD patient with Dementia. .   Navigator will sign off at this time.   Rhae Hammock, BSN, Scientist, clinical (histocompatibility and immunogenetics) Only

## 2023-01-31 NOTE — Consult Note (Signed)
NAME:  [name] [name], MRN:  [id number], DOB:  1945/08/25, LOS: 0 ADMISSION DATE:  01/30/2023, CONSULTATION DATE:  01/31/23 REFERRING MD:  Jonathon Bellows CHIEF COMPLAINT:  Dyspnea   History of Present Illness:  [name] [name] is a 77 y.o. male who has a PMH as below including but not limited to OSA and OHS on nocturnal BiPAP with 4L O2 bleed in, chronic hypoxic respiratory failure on 4-5L O2 admitted with AMS and dyspnea. He wears his BiPAP routinely at night and has not had any problems. He had an admission back in April 2024 after a fall where he suffered a T12 - L1 endplate spine fx and was discharged to SNF. He then had another admission early June 2024 for UTI and was again discharged to SNF. Daughter states ever since his first admission, he has been essentially bedbound with generalized weakness and deconditioning.  He was admitted by [facility] for AoC hypoxic respiratory failure and was placed on BiPAP. His initial ABG showed AoC hypoxic and hypercapnic respiratory failure (7.27/71/75). He had a repeat 2 hours later with some improvement in hypercapnia (7.38/53/52. Later that day, he had a VBG that showed increase in CO2 again (7.30/70/124). PCCM was subsequently called in consultation.  At the time of my evaluation, PMT was just finishing their visit with pt and daughter and son. Pt is awake and able to tell me his name and where he is. He is confused on the date but family states this is chronic/normal for him. They feel that he is not any different from his baseline in terms of mental status.  His CXR from admission shows CHF. He has been receiving Lasix and current I/O's show a net balance of -150.   Pertinent  Medical History:  has Non-insulin dependent type 2 diabetes mellitus (HCC); Hyperlipidemia; Severe obesity (BMI >= 40) (HCC); Obesity hypoventilation syndrome (HCC); COMMON MIGRAINE; Unspecified hearing loss; Essential hypertension; Permanent atrial fibrillation; BRONCHITIS, CHRONIC; HEMATOCHEZIA; PERIPHERAL  EDEMA; PROSTATE SPECIFIC ANTIGEN, ELEVATED; Long term (current) use of anticoagulants; Encounter for well adult exam with abnormal findings; Increased prostate specific antigen (PSA) velocity; Left knee pain; DOE (dyspnea on exertion); Allergic rhinitis, cause unspecified; Volume overload; OSA treated with BiPAP; Pulmonary hypertension (HCC); Gout; Right knee pain; Degenerative arthritis of right knee; Tick bite, infected; Greater trochanteric bursitis, right; Arthritis of right acromioclavicular joint; Right rotator cuff tear; Lesion of right ear; Lower extremity weakness; Vitamin D deficiency; B12 deficiency; Left arm swelling; Urinary frequency; Actinic keratosis; Sebaceous cyst; Chronic respiratory failure with hypoxia and hypercapnia related to obesity hypoventilation syndrome; Chronic diastolic CHF (congestive heart failure) (HCC); Fatigue; Subacute cough; Closed L2 vertebral fracture (HCC); Chronic a-fib (HCC); L1 vertebral fracture (HCC); Closed fracture of eleventh thoracic vertebra (HCC); Complicated UTI (urinary tract infection); Sepsis (HCC); Bacteremia; Acute on chronic diastolic CHF (congestive heart failure) (HCC); Acute respiratory failure with hypoxia and hypercarbia (HCC); Dyslipidemia; Type 2 diabetes mellitus without complications (HCC); BPH (benign prostatic hyperplasia); and Acute respiratory failure with hypoxia and hypercapnia (HCC) on their problem list.  Significant Hospital Events: Including procedures, antibiotic start and stop dates in addition to other pertinent events   6/23 admit 6/24 PCCM consult  Interim History / Subjective:  Stable on 5L O2 via [place]. He is awake and interactive but confused on the time (says it's November and 1999). This is baseline per his family at the bedside.  Objective:  Blood pressure 114/89, pulse 79, temperature 97.9 F (36.6 C), temperature source Oral, resp. rate 20, height 5\' 8"  (  1.727 m), weight 112.1 kg, SpO2 97 %.    Vent Mode: PCV FiO2  (%):  [40 %] 40 % Set Rate:  [14 bmp] 14 bmp PEEP:  [5 cmH20] 5 cmH20   Intake/Output Summary (Last 24 hours) at 01/31/2023 1632 Last data filed at 01/31/2023 1300 Gross per 24 hour  Intake 2150 ml  Output 2300 ml  Net -150 ml   Filed Weights   01/30/23 1954 01/30/23 2012 01/31/23 1300  Weight: 52.6 kg 99.8 kg 112.1 kg    Examination: General: Adult male, chronically ill appearing, resting in bed, in NAD. Neuro: Awake, alert to person and place, confused on time. HEENT: [place]/AT. Sclerae anicteric. EOMI. Cardiovascular: RRR, no M/R/G.  Lungs: Respirations even and unlabored.  CTA bilaterally, No W/R/R. Abdomen: BS x 4, soft, NT/ND.  Musculoskeletal: No gross deformities, no edema.  Skin: Intact, warm, no rashes.  Assessment & Plan:   AoC hypoxic and hypercapnic respiratory failure - on nocturnal BiPAP with 4L bleed in. Although his pCO2 has climbed, he is still compensated; therefore, would judge things clinically vs chasing numbers. - Continue supplemental O2 as needed to maintain SpO2 > 90%. - Continue nocturnal BiPAP and PRN if has increased somnolence, confusion, etc. - Avoid sedating meds. - Encourage normal sleep/wake cycle.  Acute pulmonary edema with underlying CHF. - Agree with diuresis.  Multiple admissions with failure to thrive recently. - Agree with palliative care input regarding goals of care. - Recommended DNR/DNI to family given his frailty and recent decline and the fact that if he were to get to a point of requiring a ventilator or ACLS, these measures would provide little in terms of meaningful benefit.  Best practice (evaluated daily):  Per primary team.  Labs   CBC: Recent Labs  Lab 01/30/23 2004 01/30/23 2140 01/31/23 0000 01/31/23 1235  WBC 9.2  --   --   --   NEUTROABS 6.9  --   --   --   HGB 10.3* 10.5* 9.9* 11.2*  HCT 33.7* 31.0* 29.0* 33.0*  MCV 96.3  --   --   --   PLT 273  --   --   --     Basic Metabolic Panel: Recent Labs  Lab  01/30/23 2004 01/30/23 2140 01/31/23 0000 01/31/23 1217 01/31/23 1235  NA 143 143 143 141 143  K 3.7 3.6 3.6 4.2 4.3  CL 103  --   --  104  --   CO2 31  --   --  31  --   GLUCOSE 141*  --   --  196*  --   BUN 18  --   --  21  --   CREATININE 2.03*  --   --  1.97*  --   CALCIUM 9.0  --   --  8.8*  --    GFR: Estimated Creatinine Clearance: 38.2 mL/min (A) (by C-G formula based on SCr of 1.97 mg/dL (H)). Recent Labs  Lab 01/30/23 2004 01/30/23 2220  WBC 9.2  --   LATICACIDVEN 1.4 1.3    Liver Function Tests: Recent Labs  Lab 01/30/23 2004  AST 19  ALT 22  ALKPHOS 49  BILITOT 0.8  PROT 5.6*  ALBUMIN <1.5*   No results for input(s): "LIPASE", "AMYLASE" in the last 168 hours. Recent Labs  Lab 01/30/23 2004  AMMONIA 16    ABG    Component Value Date/Time   PHART 7.273 (L) 01/30/2023 2140   PCO2ART 71.7 (HH) 01/30/2023 2140  PO2ART 75 (L) 01/30/2023 2140   HCO3 35.4 (H) 01/31/2023 1235   TCO2 38 (H) 01/31/2023 1235   O2SAT 98 01/31/2023 1235     Coagulation Profile: Recent Labs  Lab 01/30/23 2004  INR 1.9*    Cardiac Enzymes: No results for input(s): "CKTOTAL", "CKMB", "CKMBINDEX", "TROPONINI" in the last 168 hours.  HbA1C: Hgb A1c MFr Bld  Date/Time Value Ref Range Status  10/08/2022 04:11 PM 7.0 (H) 4.6 - 6.5 % Final    Comment:    Glycemic Control Guidelines for People with Diabetes:Non Diabetic:  <6%Goal of Therapy: <7%Additional Action Suggested:  >8%   04/30/2022 11:05 AM 6.7 (H) 4.6 - 6.5 % Final    Comment:    Glycemic Control Guidelines for People with Diabetes:Non Diabetic:  <6%Goal of Therapy: <7%Additional Action Suggested:  >8%     CBG: Recent Labs  Lab 01/31/23 0732 01/31/23 1215  GLUCAP 153* 160*    Review of Systems:   Unable to obtain as pt is encephalopathic.  Past Medical History:  He,  has a past medical history of Acute right-sided CHF (congestive heart failure) (HCC), Arthritis, BRONCHITIS, CHRONIC (01/02/2009),  COMMON MIGRAINE, Complication of anesthesia, DIABETES MELLITUS, TYPE II, GOUT, HYPERLIPIDEMIA, HYPERTENSION, Long term (current) use of anticoagulants, Morbid obesity (HCC), OBESITY HYPOVENTILATION SYNDROME, On home oxygen therapy, OSA (obstructive sleep apnea), Permanent atrial fibrillation (HCC), PROSTATE SPECIFIC ANTIGEN, ELEVATED (06/09/2007), Pulmonary HTN (HCC), SKIN RASH (03/12/2010), and Unspecified hearing loss.   Surgical History:   Past Surgical History:  Procedure Laterality Date   APPENDECTOMY  1974   APPLICATION OF ROBOTIC ASSISTANCE FOR SPINAL PROCEDURE N/A 11/23/2022   Procedure: APPLICATION OF ROBOTIC ASSISTANCE FOR SPINAL PROCEDURE;  Surgeon: Lisbeth Renshaw, MD;  Location: [facility] OR;  Service: Neurosurgery;  Laterality: N/A;   CARDIOVERSION  05/21/2008; ~ 06/2008   HERNIA REPAIR     INGUINAL HERNIA REPAIR Right    RIGHT HEART CATHETERIZATION N/A 01/08/2013   Procedure: RIGHT HEART CATH;  Surgeon: Vesta Mixer, MD;  Location: [facility] CATH LAB;  Service: Cardiovascular;  Laterality: N/A;   TENDON REPAIR Right 2009   "lacerated his tendon and pulley" Dr. Izora Ribas   UMBILICAL HERNIA REPAIR       Social History:   reports that he quit smoking about 44 years ago. His smoking use included cigarettes. He has a 15.00 pack-year smoking history. He has never used smokeless tobacco. He reports current alcohol use. He reports that he does not use drugs.   Family History:  His family history includes Alzheimer's disease in his father; Cancer in an other family member; Coronary artery disease in an other family member; Prostate cancer in his father.   Allergies Allergies  Allergen Reactions   Codeine     Altered mental status "goofy"   Heparin Rash     Abdominal rash 01/2013     Home Medications  Prior to Admission medications   Medication Sig Start Date End Date Taking? Authorizing Provider  acetaminophen (TYLENOL) 500 MG tablet Take 500-1,000 mg by mouth every 6 (six) hours as needed  for moderate pain.   Yes [provider]  allopurinol (ZYLOPRIM) 100 MG tablet TAKE 1 TABLET DAILY Patient taking differently: Take 100 mg by mouth daily. 01/25/22  Yes Corwin Levins, MD  atorvastatin (LIPITOR) 20 MG tablet TAKE 1 TABLET DAILY Patient taking differently: Take 20 mg by mouth at bedtime. 01/25/22  Yes Corwin Levins, MD  cholecalciferol (VITAMIN D3) 25 MCG (1000 UNIT) tablet Take 2,000 Units by  mouth daily.   Yes [provider]  diltiazem (CARDIZEM CD) 120 MG 24 hr capsule Take 1 capsule (120 mg total) by mouth daily. 01/19/23 01/19/24 Yes Kc, Dayna Barker, MD  ELIQUIS 5 MG TABS tablet TAKE 1 TABLET TWICE A DAY Patient taking differently: Take 5 mg by mouth 2 (two) times daily. 01/25/22  Yes Lewayne Bunting, MD  glipiZIDE (GLUCOTROL XL) 10 MG 24 hr tablet Take 1 tablet (10 mg total) by mouth daily with breakfast. 07/15/22  Yes Corwin Levins, MD  metoprolol tartrate (LOPRESSOR) 25 MG tablet TAKE 1 TABLET(25 MG) BY MOUTH TWICE DAILY Patient taking differently: Take 25 mg by mouth 2 (two) times daily. 07/12/22  Yes Corwin Levins, MD  OXYGEN Inhale 3-5 L into the lungs daily. 3L Daily  4L Resting  5L Exertion   Yes [provider]  saccharomyces boulardii (FLORASTOR) 250 MG capsule Take 1 capsule (250 mg total) by mouth 2 (two) times daily for 14 days. 01/19/23 02/02/23 Yes Lanae Boast, MD  tamsulosin (FLOMAX) 0.4 MG CAPS capsule Take 1 capsule (0.4 mg total) by mouth daily after supper. 01/19/23  Yes Lanae Boast, MD  traMADol (ULTRAM) 50 MG tablet Take 50 mg by mouth every 6 (six) hours as needed for moderate pain.   Yes [provider]  Lancets MISC Use as directed once daily  E11.9 04/19/17   Corwin Levins, MD     Critical care time: N/A   Rutherford Guys, PA - [facility] For pager details, please see AMION or use Epic chat  After 1900, please call [facility] for cross coverage needs 01/31/2023, 4:32 PM

## 2023-01-31 NOTE — Progress Notes (Addendum)
Patient seen and examined personally, I reviewed the chart, history and physical and admission note, done by admitting physician this morning and agree with the same with following addendum.  Please refer to the morning admission note for more detailed plan of care.  Briefly,  77 yom w/ diastolic CHF, migraine, T2DM,hypertension, dyslipidemia, GERD, OSA and pulmonary hypertension,presented to ED W/ acute onset of worsening dyspnea with associated orthopnea and paroxysmal nocturnal dyspnea as well as dyspnea on exertion and lower extremity edema. EMS was called at [facility] because patient had AMS, hypoxia and hematuria initially indwelling urinary catheter.  He is complaining of back pain from injury a couple of weeks ago.  For EMS EKG showed A-fib with heart rate 80-90, respiration 24 ETCO2 47 CBG 163 pulse ox 95% on 8 L normally 95% on 4 L, was not on oxygen on arrival.  In the ED patient was hypotensive on arrival, concerning for sepsis. He was c/o dysuria and urinary frequency and urgency. initially 84/40 and later on 91/62, RR 27 and later 19 and pulse 70 was 93% on 6 L O2>was placed on BiPAP for respiratory distress and pulse extremity was 94% on 40% FiO2 on BiPAP.  Labs: Creatinine of 2.03 and glucose of 141,albumin <1.5.Lactic acid was 1.4 and CBC showed anemia close to baseline.  INR was 1.9 PT 21.8 with a PTT of 40.  Influenza antigens, COVID-19 and RSV PCR's came back negative.  UA was positive for UTI.Urine and blood cultures were drawn. [id number] fibrillation with a rate of 101 Imaging: Portable chest x-ray showed finding consistent with CHF, however BNP not significantly elevated, CT head no acute finding.  Renal ultrasound increased echogenicity ABG suggestive of respiratory acidosis.  In ED given 1.75 L of IV LR, IV vancomycin, Flagyl and cefepime for initial hypotension and suspected sepsis. Patient's blood pressure improved, Dr Marchelle Gearing from critical care was  consulted, repeat blood gas showed improvement in pH  Patient is admitted under [facility] for further management.      On my exam Was sleeping- but woke up  Moaning- but says "I am alright" denies pain. On [place] o2 at 4l and bedtime BIPAP at home Lung BS diminished Able to say his name dob and current location Moved his both legs and arms Legs are hyperpigmented and w/ chronic appearing leg edema Obese abdomen BS+ Labs pending  A/P: Acute on chronic respiratory failure with hypoxia and hypercapnia with acute respiratory distress: Acute on chronic diastolic CHF At baseline on BiPAP at bedtime and 40 to nasal cannula at a time: Initially needed BiPAP for respiratory distress subsequently weaned down to 4 L nasal cannula, family report normally on 4 L [place] for EMS needed 8 L.  Troponin negative x 2 7, 5 BNP 230, chest x-ray shows features of CHF.Recent echo from 11/27/2023 EF 60 to 60%.  Cardiology has been consulted.  Wean oxygen as tolerated. Currently at 4l. contBIPAP bedtime and 4l [place] repeat ABG this afternoon to monitor his co2 level. Addendum : VBG shows pco2 in 70: PCCM consulted, RT to place patient back on Bipap per recommendation, PCCM to see pateint as well. BMP shows creat at 1.9.  Acute metabolic encephalopathy: presented with lethargy/altered mental status from skilled nursing facility: Likely multifactorial in the setting of respiratory status, UTI.  CT head no acute finding.  Initially needing BiPAP. He is mildy sleepy, but is alert awake oriented  AKI on CKD 3a: Baseline creatinine 1.5-1.6, 2 on admit. Recheck bmp.  Caution with IV Lasix and also on vancomycin Recent Labs    12/03/22 0648 12/06/22 1149 01/12/23 2034 01/14/23 0827 01/15/23 0122 01/16/23 0119 01/17/23 0117 01/18/23 0041 01/19/23 0021 01/30/23 2004  BUN 14 12 37* 25* 25* 25* 22 19 18 18   CREATININE 1.21 1.16 1.98* 1.97* 1.83* 1.65* 1.63* 1.47* 1.55* 2.03*  CO2 29 29 28 26 26 27 26 26 26  31    Suspected  sepsis due to UTI: Initially hypotensive now improved.  UA abnormal, although no leukocytosis on empiric cefepime, vancomycin pending blood and urine culture.  Denies recent unremarkable Recent Labs  Lab 01/30/23 2004 01/30/23 2220  WBC 9.2  --   LATICACIDVEN 1.4 1.3    Chronic atrial fibrillation Essential hypertension:  Due to initial hypotension will hold Cardizem continue Lopressor for A-fib, on Eliquis.  Hyperlipidemia continue statin  BPH continue Flomax  Type 2 diabetes mellitus: Will hold OHA keep on SSI for now Recent Labs  Lab 01/31/23 0732  GLUCAP 153*    Gout: Continue labetalol  Normocytic anemia hemoglobin 9 to 10 g range chronic, stable.  Monitor  Recent admission 6/5-6/12 for Klebsiella bacteremia sepsis and UTI Recurrent hospitalization, advanced age: Consult palliative care. PTOT consult. Encourage daughter to discuss I called and updated patient's daughter

## 2023-01-31 NOTE — ED Notes (Signed)
ED TO INPATIENT HANDOFF REPORT  ED Nurse Name and Phone #: Pearla Mckinny [phone]  S Name/Age/Gender [name] [name] 77 y.o. male Room/Bed: 007C/007C  Code Status   Code Status: Full Code  Home/SNF/Other Nursing Home Patient oriented to: self Is this baseline? No   Triage Complete: Triage complete  Chief Complaint Acute on chronic diastolic CHF (congestive heart failure) (HCC) [I50.33]  Triage Note PT arrives via EMs from [facility]. Staff there called out because patient noted to have AMS, hypoxia, and hematuria in indewelling urinary catheter. Pt c/o back pain from injury a couple of weeks ago. Afib 80-90, RR 24, ETCO2 47, CBG 163. Sp02 on 8L 95%, normally 95% on 4L however found by EMS on no oxygen on arrival to scene. Pt given tramadol by NH, PTA for back pain.    Allergies Allergies  Allergen Reactions   Codeine     Altered mental status "goofy"   Heparin Rash     Abdominal rash 01/2013    Level of Care/Admitting Diagnosis ED Disposition     ED Disposition  Admit   Condition  --   Comment  Hospital Area: [facility] [[id number]]  Level of Care: Progressive [102]  Admit to Progressive based on following criteria: RESPIRATORY PROBLEMS hypoxemic/hypercapnic respiratory failure that is responsive to NIPPV (BiPAP) or High Flow Nasal Cannula (6-80 lpm). Frequent assessment/intervention, no > Q2 hrs < Q4 hrs, to maintain oxygenation and pulmonary hygiene.  May admit patient to [facility] or [facility] if equivalent level of care is available:: No  Covid Evaluation: Asymptomatic - no recent exposure (last 10 days) testing not required  Diagnosis: Acute on chronic diastolic CHF (congestive heart failure) [facility]) [[id number]]  Admitting Physician: Hannah Beat [[id number]]  Attending Physician: Hannah Beat [[id number]]  Certification:: I certify this patient will need inpatient services for at least 2 midnights  Estimated Length of Stay: 2           B Medical/Surgery History Past Medical History:  Diagnosis Date   Acute right-sided CHF (congestive heart failure) (HCC)    a. 01/2013.   Arthritis    "knees and hands; thoracic area of the spine" (01/05/2013)   BRONCHITIS, CHRONIC 01/02/2009   COMMON MIGRAINE    "none since treating for high BP"   Complication of anesthesia    "aspiration pneumonia after hand OR" (01/05/2013)   DIABETES MELLITUS, TYPE II    GOUT    HYPERLIPIDEMIA    HYPERTENSION    Long term (current) use of anticoagulants    Morbid obesity (HCC)    OBESITY HYPOVENTILATION SYNDROME    a. on home O2.   On home oxygen therapy    OSA (obstructive sleep apnea)    Permanent atrial fibrillation (HCC)    PROSTATE SPECIFIC ANTIGEN, ELEVATED 06/09/2007   Pulmonary HTN (HCC)    a. multifactorial including obstructive sleep apnea, obesity hypoventilation syndrome and possible pulmonary venous hypertension.   SKIN RASH 03/12/2010   Unspecified hearing loss    Past Surgical History:  Procedure Laterality Date   APPENDECTOMY  1974   APPLICATION OF ROBOTIC ASSISTANCE FOR SPINAL PROCEDURE N/A 11/23/2022   Procedure: APPLICATION OF ROBOTIC ASSISTANCE FOR SPINAL PROCEDURE;  Surgeon: Lisbeth Renshaw, MD;  Location: [facility] OR;  Service: Neurosurgery;  Laterality: N/A;   CARDIOVERSION  05/21/2008; ~ 06/2008   HERNIA REPAIR     INGUINAL HERNIA REPAIR Right    RIGHT HEART CATHETERIZATION N/A 01/08/2013   Procedure: RIGHT HEART  CATH;  Surgeon: Vesta Mixer, MD;  Location: [facility] CATH LAB;  Service: Cardiovascular;  Laterality: N/A;   TENDON REPAIR Right 2009   "lacerated his tendon and pulley" Dr. Izora Ribas   UMBILICAL HERNIA REPAIR       A IV Location/Drains/Wounds Patient Lines/Drains/Airways Status     Active Line/Drains/Airways     Name Placement date Placement time Site Days   Peripheral IV 01/30/23 18 G Right Antecubital 01/30/23  2012  Antecubital  1            Intake/Output Last 24 hours  Intake/Output Summary  (Last 24 hours) at 01/31/2023 1145 Last data filed at 01/30/2023 2123 Gross per 24 hour  Intake 2150 ml  Output --  Net 2150 ml    Labs/Imaging Results for orders placed or performed during the hospital encounter of 01/30/23 (from the past 48 hour(s))  Lactic acid, plasma     Status: None   Collection Time: 01/30/23  8:04 PM  Result Value Ref Range   Lactic Acid, Venous 1.4 0.5 - 1.9 mmol/L    Comment: Performed at [facility], [address], [place], [place] [address]  Comprehensive metabolic panel     Status: Abnormal   Collection Time: 01/30/23  8:04 PM  Result Value Ref Range   Sodium 143 135 - 145 mmol/L   Potassium 3.7 3.5 - 5.1 mmol/L   Chloride 103 98 - 111 mmol/L   CO2 31 22 - 32 mmol/L   Glucose, Bld 141 (H) 70 - 99 mg/dL    Comment: Glucose reference range applies only to samples taken after fasting for at least 8 hours.   BUN 18 8 - 23 mg/dL   Creatinine, Ser 5.28 (H) 0.61 - 1.24 mg/dL   Calcium 9.0 8.9 - 41.3 mg/dL   Total Protein 5.6 (L) 6.5 - 8.1 g/dL   Albumin <2.4 (L) 3.5 - 5.0 g/dL   AST 19 15 - 41 U/L   ALT 22 0 - 44 U/L   Alkaline Phosphatase 49 38 - 126 U/L   Total Bilirubin 0.8 0.3 - 1.2 mg/dL   GFR, Estimated 33 (L) >60 mL/min    Comment: (NOTE) Calculated using the CKD-EPI Creatinine Equation (2021)    Anion gap 9 5 - 15    Comment: Performed at [facility], [address] [place], [place] [address]  CBC with Differential     Status: Abnormal   Collection Time: 01/30/23  8:04 PM  Result Value Ref Range   WBC 9.2 4.0 - 10.5 K/uL   RBC 3.50 (L) 4.22 - 5.81 MIL/uL   Hemoglobin 10.3 (L) 13.0 - 17.0 g/dL   HCT 72.5 (L) 36.6 - 44.0 %   MCV 96.3 80.0 - 100.0 fL   MCH 29.4 26.0 - 34.0 pg   MCHC 30.6 30.0 - 36.0 g/dL   RDW 34.7 (H) 42.5 - 95.6 %   Platelets 273 150 - 400 K/uL   nRBC 0.0 0.0 - 0.2 %   Neutrophils Relative % 75 %   Neutro Abs 6.9 1.7 - 7.7 K/uL   Lymphocytes Relative 17 %   Lymphs Abs 1.6 0.7 - 4.0 K/uL   Monocytes  Relative 6 %   Monocytes Absolute 0.5 0.1 - 1.0 K/uL   Eosinophils Relative 1 %   Eosinophils Absolute 0.1 0.0 - 0.5 K/uL   Basophils Relative 0 %   Basophils Absolute 0.0 0.0 - 0.1 K/uL   Immature Granulocytes 1 %  Abs Immature Granulocytes 0.05 0.00 - 0.07 K/uL    Comment: Performed at [facility], [address], [place], [place] [address]  Protime-INR     Status: Abnormal   Collection Time: 01/30/23  8:04 PM  Result Value Ref Range   Prothrombin Time 21.8 (H) 11.4 - 15.2 seconds   INR 1.9 (H) 0.8 - 1.2    Comment: (NOTE) INR goal varies based on device and disease states. Performed at [facility], [address] [place], [place] [address]   APTT     Status: Abnormal   Collection Time: 01/30/23  8:04 PM  Result Value Ref Range   aPTT 40 (H) 24 - 36 seconds    Comment:        IF BASELINE aPTT IS ELEVATED, SUGGEST PATIENT RISK ASSESSMENT BE USED TO DETERMINE APPROPRIATE ANTICOAGULANT THERAPY. Performed at [facility], [address], [place], [place] [address]   Blood Culture (routine x 2)     Status: None (Preliminary result)   Collection Time: 01/30/23  8:04 PM   Specimen: BLOOD RIGHT ARM  Result Value Ref Range   Specimen Description BLOOD RIGHT ARM    Special Requests      BOTTLES DRAWN AEROBIC AND ANAEROBIC Blood Culture results may not be optimal due to an excessive volume of blood received in culture bottles   Culture      NO GROWTH < 12 HOURS Performed at [facility], [address] [place], [place] [address]    Report Status PENDING   Ammonia     Status: None   Collection Time: 01/30/23  8:04 PM  Result Value Ref Range   Ammonia 16 9 - 35 umol/L    Comment: Performed at [facility], [address], [place], [place] [address]  Urinalysis, w/ Reflex to Culture (Infection Suspected) -Urine, Catheterized     Status: Abnormal   Collection Time: 01/30/23  8:07 PM  Result Value Ref Range   Specimen Source URINE, CATHETERIZED     Color, Urine RED (A) YELLOW    Comment: BIOCHEMICALS MAY BE AFFECTED BY COLOR   APPearance HAZY (A) CLEAR   Specific Gravity, Urine 1.008 1.005 - 1.030   pH 6.0 5.0 - 8.0   Glucose, UA 50 (A) NEGATIVE mg/dL   Hgb urine dipstick MODERATE (A) NEGATIVE   Bilirubin Urine NEGATIVE NEGATIVE   Ketones, ur NEGATIVE NEGATIVE mg/dL   Protein, ur 536 (A) NEGATIVE mg/dL   Nitrite NEGATIVE NEGATIVE   Leukocytes,Ua MODERATE (A) NEGATIVE   RBC / HPF >50 0 - 5 RBC/hpf   WBC, UA >50 0 - 5 WBC/hpf    Comment:        Reflex urine culture not performed if WBC <=10, OR if Squamous epithelial cells >5. If Squamous epithelial cells >5 suggest recollection.    Bacteria, UA FEW (A) NONE SEEN   Squamous Epithelial / HPF 0-5 0 - 5 /HPF   Mucus PRESENT    Hyaline Casts, UA PRESENT     Comment: Performed at [facility], [address] [place], [place] [address]  Resp panel by RT-PCR (RSV, Flu A&B, Covid) Anterior Nasal Swab     Status: None   Collection Time: 01/30/23  8:10 PM   Specimen: Anterior Nasal Swab  Result Value Ref Range   SARS Coronavirus 2 by RT PCR NEGATIVE NEGATIVE   Influenza A by PCR NEGATIVE NEGATIVE   Influenza B by PCR  NEGATIVE NEGATIVE    Comment: (NOTE) The Xpert Xpress SARS-CoV-2/FLU/RSV plus assay is intended as an aid in the diagnosis of influenza from Nasopharyngeal swab specimens and should not be used as a sole basis for treatment. Nasal washings and aspirates are unacceptable for Xpert Xpress SARS-CoV-2/FLU/RSV testing.  Fact Sheet for Patients: BloggerCourse.com  Fact Sheet for Healthcare Providers: SeriousBroker.it  This test is not yet approved or cleared by the [place] FDA and has been authorized for detection and/or diagnosis of SARS-CoV-2 by FDA under an Emergency Use Authorization (EUA). This EUA will remain in effect (meaning this test can be used) for the duration of the COVID-19 declaration  under Section 564(b)(1) of the Act, 21 U.S.C. section 360bbb-3(b)(1), unless the authorization is terminated or revoked.     Resp Syncytial Virus by PCR NEGATIVE NEGATIVE    Comment: (NOTE) Fact Sheet for Patients: BloggerCourse.com  Fact Sheet for Healthcare Providers: SeriousBroker.it  This test is not yet approved or cleared by the [place] FDA and has been authorized for detection and/or diagnosis of SARS-CoV-2 by FDA under an Emergency Use Authorization (EUA). This EUA will remain in effect (meaning this test can be used) for the duration of the COVID-19 declaration under Section 564(b)(1) of the Act, 21 U.S.C. section 360bbb-3(b)(1), unless the authorization is terminated or revoked.  Performed at [facility], [address] [place], [place] [address]   Blood Culture (routine x 2)     Status: None (Preliminary result)   Collection Time: 01/30/23  8:23 PM   Specimen: BLOOD LEFT ARM  Result Value Ref Range   Specimen Description BLOOD LEFT ARM    Special Requests      BOTTLES DRAWN AEROBIC AND ANAEROBIC Blood Culture adequate volume   Culture      NO GROWTH < 12 HOURS Performed at [facility], [address], [place], [place] [address]    Report Status PENDING   I-Stat arterial blood gas, ED [facility], [facility], [facility])     Status: Abnormal   Collection Time: 01/30/23  9:40 PM  Result Value Ref Range   pH, Arterial 7.273 (L) 7.35 - 7.45   pCO2 arterial 71.7 (HH) 32 - 48 mmHg   pO2, Arterial 75 (L) 83 - 108 mmHg   Bicarbonate 33.1 (H) 20.0 - 28.0 mmol/L   TCO2 35 (H) 22 - 32 mmol/L   O2 Saturation 92 %   Acid-Base Excess 5.0 (H) 0.0 - 2.0 mmol/L   Sodium 143 135 - 145 mmol/L   Potassium 3.6 3.5 - 5.1 mmol/L   Calcium, Ion 1.36 1.15 - 1.40 mmol/L   HCT 31.0 (L) 39.0 - 52.0 %   Hemoglobin 10.5 (L) 13.0 - 17.0 g/dL   Patient temperature 87.5 F    Collection site RADIAL, ALLEN'S TEST ACCEPTABLE    Drawn by  Operator    Sample type ARTERIAL    Comment NOTIFIED PHYSICIAN   Lactic acid, plasma     Status: None   Collection Time: 01/30/23 10:20 PM  Result Value Ref Range   Lactic Acid, Venous 1.3 0.5 - 1.9 mmol/L    Comment: Performed at [facility], [address] [place], [place] [address]  Brain natriuretic peptide     Status: Abnormal   Collection Time: 01/30/23 10:20 PM  Result Value Ref Range   B Natriuretic Peptide 230.3 (H) 0.0 - 100.0 pg/mL    Comment: Performed at [facility], [address],  [place], [place] [address]  Troponin I (High Sensitivity)     Status: None   Collection Time: 01/30/23 10:20 PM  Result Value Ref Range   Troponin I (High Sensitivity) 7 <18 ng/L    Comment: (NOTE) Elevated high sensitivity troponin I (hsTnI) values and significant  changes across serial measurements may suggest ACS but many other  chronic and acute conditions are known to elevate hsTnI results.  Refer to the "Links" section for chest pain algorithms and additional  guidance. Performed at [facility], [address] [place], [place] [address]   Troponin I (High Sensitivity)     Status: None   Collection Time: 01/30/23 11:55 PM  Result Value Ref Range   Troponin I (High Sensitivity) 5 <18 ng/L    Comment: (NOTE) Elevated high sensitivity troponin I (hsTnI) values and significant  changes across serial measurements may suggest ACS but many other  chronic and acute conditions are known to elevate hsTnI results.  Refer to the "Links" section for chest pain algorithms and additional  guidance. Performed at [facility], [address], [place], [place] [address]   I-Stat venous blood gas, [facility], [facility], [facility])     Status: Abnormal   Collection Time: 01/31/23 12:00 AM  Result Value Ref Range   pH, Ven 7.381 7.25 - 7.43   pCO2, Ven 53.7 44 - 60 mmHg   pO2, Ven 52 (H) 32 - 45 mmHg   Bicarbonate 31.8 (H) 20.0 - 28.0 mmol/L   TCO2 33 (H) 22 - 32 mmol/L   O2  Saturation 85 %   Acid-Base Excess 6.0 (H) 0.0 - 2.0 mmol/L   Sodium 143 135 - 145 mmol/L   Potassium 3.6 3.5 - 5.1 mmol/L   Calcium, Ion 1.26 1.15 - 1.40 mmol/L   HCT 29.0 (L) 39.0 - 52.0 %   Hemoglobin 9.9 (L) 13.0 - 17.0 g/dL   Sample type VENOUS   CBG monitoring, ED     Status: Abnormal   Collection Time: 01/31/23  7:32 AM  Result Value Ref Range   Glucose-Capillary 153 (H) 70 - 99 mg/dL    Comment: Glucose reference range applies only to samples taken after fasting for at least 8 hours.   US Renal  Result Date: 01/30/2023 CLINICAL DATA:  aki EXAM: RENAL / URINARY TRACT ULTRASOUND COMPLETE COMPARISON:  CT abdomen pelvis 01/12/2024 FINDINGS: Right Kidney: Renal measurements: 10.4 x 5 x 4.7 cm = volume: 127 mL. Renal cortical scarring. Echogenicity increased. No mass or hydronephrosis visualized. Left Kidney: Renal measurements: 13.5 x 6.2 x 7 cm = volume: 306 mL. Echogenicity increased. Multiple simple renal cysts as well as a single minimally complex thinly septated cyst noted-no further follow-up indicated. No solid mass or hydronephrosis visualized. Urinary bladder: Appears normal for degree of bladder distention. Other: None. IMPRESSION: Increased renal echogenicity suggestive of renal parenchymal disease. Electronically Signed   By: Tish Frederickson M.D.   On: 01/30/2023 23:32   CT Head Wo Contrast  Result Date: 01/30/2023 CLINICAL DATA:  Mental status change, unknown cause EXAM: CT HEAD WITHOUT CONTRAST TECHNIQUE: Contiguous axial images were obtained from the base of the skull through the vertex without intravenous contrast. RADIATION DOSE REDUCTION: This exam was performed according to the departmental dose-optimization program which includes automated exposure control, adjustment of the mA and/or kV according to patient size and/or use of iterative reconstruction technique. COMPARISON:  CT head 01/12/2023 FINDINGS: Brain: Cerebral ventricle sizes are concordant with the degree of  cerebral volume loss. Patchy and confluent areas of decreased attenuation are noted throughout the deep and periventricular white matter of the cerebral hemispheres bilaterally, compatible with chronic microvascular ischemic disease. No evidence of large-territorial acute infarction. No parenchymal hemorrhage. No mass lesion. No extra-axial collection. No mass effect or midline shift. No hydrocephalus. Basilar cisterns are patent. Vascular: No hyperdense vessel. Skull: No acute fracture or focal lesion. Sinuses/Orbits: Paranasal sinuses and mastoid air cells are clear. Bilateral lens replacement. Otherwise the orbits are unremarkable. Other: None. IMPRESSION: No acute intracranial abnormality. Electronically Signed   By: Tish Frederickson M.D.   On: 01/30/2023 22:07   DG Chest Port 1 View  Result Date: 01/30/2023 CLINICAL DATA:  Sepsis EXAM: PORTABLE CHEST 1 VIEW COMPARISON:  01/14/2023 FINDINGS: Single frontal view of the chest demonstrates an enlarged cardiac silhouette. There is central vascular congestion, with patchy perihilar airspace disease and small bilateral pleural effusions. No pneumothorax. No acute bony abnormalities. IMPRESSION: 1. Constellation of findings most consistent with congestive heart failure. Electronically Signed   By: Sharlet Salina M.D.   On: 01/30/2023 20:31    Pending Labs Unresulted Labs (From admission, onward)     Start     Ordered   02/01/23 0500  Basic metabolic panel  Tomorrow morning,   R        01/31/23 1126   01/31/23 1125  Basic metabolic panel  ONCE - STAT,   STAT        01/31/23 1124   01/31/23 0500  Basic metabolic panel  Tomorrow morning,   R        01/31/23 0027   01/31/23 0500  CBC  Tomorrow morning,   R        01/31/23 0027   01/30/23 2007  Urine Culture  Once,   R        01/30/23 2007            Vitals/Pain Today's Vitals   01/31/23 0737 01/31/23 0930 01/31/23 0957 01/31/23 1011  BP:  111/72  (!) 134/93  Pulse: 95 90  97  Resp: (!) 23 (!)  23    Temp:   98.3 F (36.8 C)   TempSrc:   Oral   SpO2: 95% 95%    Weight:      Height:      PainSc:        Isolation Precautions No active isolations  Medications Medications  ceFEPIme (MAXIPIME) 2 g in sodium chloride 0.9 % 100 mL IVPB (0 g Intravenous Stopped 01/31/23 0815)  vancomycin (VANCOCIN) IVPB 1000 mg/200 mL premix (has no administration in time range)  allopurinol (ZYLOPRIM) tablet 100 mg (100 mg Oral Given 01/31/23 1011)  atorvastatin (LIPITOR) tablet 20 mg (20 mg Oral Given 01/31/23 1011)  metoprolol tartrate (LOPRESSOR) tablet 25 mg (25 mg Oral Given 01/31/23 1011)  saccharomyces boulardii (FLORASTOR) capsule 250 mg (250 mg Oral Given 01/31/23 1011)  tamsulosin (FLOMAX) capsule 0.4 mg (has no administration in time range)  cyanocobalamin (VITAMIN B12) injection 1,000 mcg (has no administration in time range)  apixaban (ELIQUIS) tablet 5 mg (5 mg Oral Given 01/31/23 1011)  cholecalciferol (VITAMIN D3) 25 MCG (1000 UNIT) tablet 2,000 Units (2,000 Units Oral Given 01/31/23 1011)  furosemide (LASIX) injection 40 mg (40 mg Intravenous Given 01/31/23 0053)  acetaminophen (TYLENOL) tablet 650 mg (has no administration in time range)    Or  acetaminophen (TYLENOL) suppository 650 mg (has no administration in time range)  traZODone (DESYREL) tablet 25 mg (has no  administration in time range)  magnesium hydroxide (MILK OF MAGNESIA) suspension 30 mL (has no administration in time range)  ondansetron (ZOFRAN) tablet 4 mg (has no administration in time range)    Or  ondansetron (ZOFRAN) injection 4 mg (has no administration in time range)  insulin aspart (novoLOG) injection 0-9 Units (2 Units Subcutaneous Given 01/31/23 0739)  insulin aspart (novoLOG) injection 0-5 Units (has no administration in time range)  lactated ringers bolus 1,000 mL (0 mLs Intravenous Stopped 01/30/23 2059)    And  lactated ringers bolus 500 mL (0 mLs Intravenous Stopped 01/30/23 2046)    And  lactated ringers  bolus 250 mL (0 mLs Intravenous Stopped 01/30/23 2046)  ceFEPIme (MAXIPIME) 2 g in sodium chloride 0.9 % 100 mL IVPB (0 g Intravenous Stopped 01/30/23 2059)  metroNIDAZOLE (FLAGYL) IVPB 500 mg (0 mg Intravenous Stopped 01/30/23 2123)  vancomycin (VANCOCIN) IVPB 1000 mg/200 mL premix (0 mg Intravenous Stopped 01/30/23 2123)  methylPREDNISolone sodium succinate (SOLU-MEDROL) 125 mg/2 mL injection 125 mg (125 mg Intravenous Given 01/30/23 2348)    Mobility      Focused Assessments    R Recommendations: See Admitting Provider Note  Report given to:   Additional Notes:

## 2023-01-31 NOTE — H&P (Addendum)
[place]   PATIENT NAME: [name] [name]    MR#:  [id number]  DATE OF BIRTH:  1946/05/27  DATE OF ADMISSION:  01/30/2023  PRIMARY CARE PHYSICIAN: Corwin Levins, MD   Patient is coming from: SNF.  REQUESTING/REFERRING PHYSICIAN: Linwood Dibbles, MD  CHIEF COMPLAINT:   Chief Complaint  Patient presents with   Altered Mental Status   Hematuria    HISTORY OF PRESENT ILLNESS:  [name] [name] is a 77 y.o. Caucasian male with medical history significant for diastolic CHF, migraine, type 2 diabetes mellitus, hypertension, dyslipidemia, GERD, OSA and pulmonary hypertension, who presented to the emergency room with acute onset of worsening dyspnea with associated orthopnea and paroxysmal nocturnal dyspnea as well as dyspnea on exertion and lower extremity edema.  He was noted to have ultimate stance with hypoxia at his SNF and blood in his urine.  His oxygen requirement was increasing.  No chest pain or palpitations.  No nausea or vomiting or abdominal pain.  He admitted to dysuria and urinary frequency and urgency.  No fever or chills.  No headache or dizziness or blurred vision. No new paresthesias or focal muscle weakness.  ED Course: When he came to the ER, his BP was initially 84/40 and later on 9162, respiratory rate 27 and later 19 and pulse 70 was 93% on 6 L O2 by nasal cannula and later on was placed on BiPAP for respiratory distress and pulse extremity was 94% on 40% FiO2 on BiPAP.  Labs revealed a creatinine of 2.03 and glucose of 141 with albumin less than 1.5 and total protein 5.6.  Lactic acid was 1.4 and CBC showed anemia close to baseline.  INR was 1.9 PT 21.8 with a PTT of 40.  Influenza antigens, COVID-19 and RSV PCR's came back negative.  UA was positive for UTI.  Urine and blood cultures were drawn. EKG as reviewed by me : EKG showed atrial fibrillation with a rate of 101 and low voltage QRS with Q waves anteroseptally. Imaging: Portable chest x-ray showed finding consistent with  CHF.  The patient was given 1.75 L of IV lactated ringer initially, IV vancomycin, Flagyl and cefepime for initial hypotension and suspected sepsis.  He will be admitted to a progressive unit bed for further evaluation and management. PAST MEDICAL HISTORY:   Past Medical History:  Diagnosis Date   Acute right-sided CHF (congestive heart failure) (HCC)    a. 01/2013.   Arthritis    "knees and hands; thoracic area of the spine" (01/05/2013)   BRONCHITIS, CHRONIC 01/02/2009   COMMON MIGRAINE    "none since treating for high BP"   Complication of anesthesia    "aspiration pneumonia after hand OR" (01/05/2013)   DIABETES MELLITUS, TYPE II    GOUT    HYPERLIPIDEMIA    HYPERTENSION    Long term (current) use of anticoagulants    Morbid obesity (HCC)    OBESITY HYPOVENTILATION SYNDROME    a. on home O2.   On home oxygen therapy    OSA (obstructive sleep apnea)    Permanent atrial fibrillation (HCC)    PROSTATE SPECIFIC ANTIGEN, ELEVATED 06/09/2007   Pulmonary HTN (HCC)    a. multifactorial including obstructive sleep apnea, obesity hypoventilation syndrome and possible pulmonary venous hypertension.   SKIN RASH 03/12/2010   Unspecified hearing loss     PAST SURGICAL HISTORY:   Past Surgical History:  Procedure Laterality Date   APPENDECTOMY  1974   APPLICATION OF ROBOTIC  ASSISTANCE FOR SPINAL PROCEDURE N/A 11/23/2022   Procedure: APPLICATION OF ROBOTIC ASSISTANCE FOR SPINAL PROCEDURE;  Surgeon: Lisbeth Renshaw, MD;  Location: [facility] OR;  Service: Neurosurgery;  Laterality: N/A;   CARDIOVERSION  05/21/2008; ~ 06/2008   HERNIA REPAIR     INGUINAL HERNIA REPAIR Right    RIGHT HEART CATHETERIZATION N/A 01/08/2013   Procedure: RIGHT HEART CATH;  Surgeon: Vesta Mixer, MD;  Location: [facility] CATH LAB;  Service: Cardiovascular;  Laterality: N/A;   TENDON REPAIR Right 2009   "lacerated his tendon and pulley" Dr. Izora Ribas   UMBILICAL HERNIA REPAIR      SOCIAL HISTORY:   Social History    Tobacco Use   Smoking status: Former    Packs/day: 1.00    Years: 15.00    Additional pack years: 0.00    Total pack years: 15.00    Types: Cigarettes    Quit date: 08/09/1978    Years since quitting: 44.5   Smokeless tobacco: Never   Tobacco comments:    01/05/2013 "quit smoking ~ 40 years ago"  Substance Use Topics   Alcohol use: Yes    Alcohol/week: 0.0 standard drinks of alcohol    Comment: 01/05/2013 "hasn't had a drink since 1980's; never had problem w/it"    FAMILY HISTORY:   Family History  Problem Relation Age of Onset   Alzheimer's disease Father    Prostate cancer Father    Cancer Other        Breast Cancer, <50 yo 1st degree relative   Coronary artery disease Other        Male, 1st degree relative    DRUG ALLERGIES:   Allergies  Allergen Reactions   Codeine     Altered mental status "goofy"   Heparin Rash     Abdominal rash 01/2013    REVIEW OF SYSTEMS:   ROS As per history of present illness. All pertinent systems were reviewed above. Constitutional, HEENT, cardiovascular, respiratory, GI, GU, musculoskeletal, neuro, psychiatric, endocrine, integumentary and hematologic systems were reviewed and are otherwise negative/unremarkable except for positive findings mentioned above in the HPI.   MEDICATIONS AT HOME:   Prior to Admission medications   Medication Sig Start Date End Date Taking? Authorizing Provider  acetaminophen (TYLENOL) 500 MG tablet Take 500-1,000 mg by mouth every 6 (six) hours as needed for moderate pain.    [provider]  allopurinol (ZYLOPRIM) 100 MG tablet TAKE 1 TABLET DAILY Patient taking differently: Take 100 mg by mouth daily. 01/25/22   Corwin Levins, MD  atorvastatin (LIPITOR) 20 MG tablet TAKE 1 TABLET DAILY Patient taking differently: Take 20 mg by mouth at bedtime. 01/25/22   Corwin Levins, MD  cholecalciferol (VITAMIN D3) 25 MCG (1000 UNIT) tablet Take 2,000 Units by mouth daily.    [provider]   diltiazem (CARDIZEM CD) 120 MG 24 hr capsule Take 1 capsule (120 mg total) by mouth daily. 01/19/23 01/19/24  Lanae Boast, MD  ELIQUIS 5 MG TABS tablet TAKE 1 TABLET TWICE A DAY Patient taking differently: Take 5 mg by mouth 2 (two) times daily. 01/25/22   Lewayne Bunting, MD  glipiZIDE (GLUCOTROL XL) 10 MG 24 hr tablet Take 1 tablet (10 mg total) by mouth daily with breakfast. 07/15/22   Corwin Levins, MD  Lancets MISC Use as directed once daily  E11.9 04/19/17   Corwin Levins, MD  metoprolol tartrate (LOPRESSOR) 25 MG tablet TAKE 1 TABLET(25 MG) BY MOUTH TWICE DAILY Patient taking  differently: Take 25 mg by mouth 2 (two) times daily. 07/12/22   Corwin Levins, MD  OXYGEN Inhale 3-5 L into the lungs daily. 3L Daily  4L Resting  5L Exertion    [provider]  saccharomyces boulardii (FLORASTOR) 250 MG capsule Take 1 capsule (250 mg total) by mouth 2 (two) times daily for 14 days. 01/19/23 02/02/23  Lanae Boast, MD  tamsulosin (FLOMAX) 0.4 MG CAPS capsule Take 1 capsule (0.4 mg total) by mouth daily after supper. 01/19/23   Lanae Boast, MD      VITAL SIGNS:  Blood pressure 114/72, pulse 84, temperature (!) 97 F (36.1 C), temperature source Temporal, resp. rate (!) 24, height 5\' 10"  (1.778 m), weight 99.8 kg, SpO2 95 %.  PHYSICAL EXAMINATION:  Physical Exam  GENERAL: Acutely ill 77 y.o.-year-old patient lying in the bed with mild respiratory distress on BiPAP. EYES: Pupils equal, round, reactive to light and accommodation. No scleral icterus. Extraocular muscles intact.  HEENT: Head atraumatic, normocephalic. Oropharynx and nasopharynx clear.  NECK:  Supple, no jugular venous distention. No thyroid enlargement, no tenderness.  LUNGS: Diminished bibasal breath sounds with bibasal rales. CARDIOVASCULAR: Regular rate and rhythm, S1, S2 normal. No murmurs, rubs, or gallops.  ABDOMEN: Soft, nondistended, nontender. Bowel sounds present. No organomegaly or mass.  EXTREMITIES: 1+ bilateral  lower extremity pitting edema with no cyanosis, or clubbing.  NEUROLOGIC: Cranial nerves II through XII are intact. Muscle strength 5/5 in all extremities. Sensation intact. Gait not checked.  PSYCHIATRIC: The patient is alert and oriented x 3.  Normal affect and good eye contact. SKIN: He has bilateral lower leg close dermatologic hyperpigmentation with scaling.  No other obvious rash, lesion, or ulcer.   LABORATORY PANEL:   CBC Recent Labs  Lab 01/30/23 2004 01/30/23 2140 01/31/23 0000  WBC 9.2  --   --   HGB 10.3*   < > 9.9*  HCT 33.7*   < > 29.0*  PLT 273  --   --    < > = values in this interval not displayed.   ------------------------------------------------------------------------------------------------------------------  Chemistries  Recent Labs  Lab 01/30/23 2004 01/30/23 2140 01/31/23 0000  NA 143   < > 143  K 3.7   < > 3.6  CL 103  --   --   CO2 31  --   --   GLUCOSE 141*  --   --   BUN 18  --   --   CREATININE 2.03*  --   --   CALCIUM 9.0  --   --   AST 19  --   --   ALT 22  --   --   ALKPHOS 49  --   --   BILITOT 0.8  --   --    < > = values in this interval not displayed.   ------------------------------------------------------------------------------------------------------------------  Cardiac Enzymes No results for input(s): "TROPONINI" in the last 168 hours. ------------------------------------------------------------------------------------------------------------------  RADIOLOGY:  US Renal  Result Date: 01/30/2023 CLINICAL DATA:  aki EXAM: RENAL / URINARY TRACT ULTRASOUND COMPLETE COMPARISON:  CT abdomen pelvis 01/12/2024 FINDINGS: Right Kidney: Renal measurements: 10.4 x 5 x 4.7 cm = volume: 127 mL. Renal cortical scarring. Echogenicity increased. No mass or hydronephrosis visualized. Left Kidney: Renal measurements: 13.5 x 6.2 x 7 cm = volume: 306 mL. Echogenicity increased. Multiple simple renal cysts as well as a single minimally complex  thinly septated cyst noted-no further follow-up indicated. No solid mass or hydronephrosis visualized. Urinary bladder: Appears  normal for degree of bladder distention. Other: None. IMPRESSION: Increased renal echogenicity suggestive of renal parenchymal disease. Electronically Signed   By: Tish Frederickson M.D.   On: 01/30/2023 23:32   CT Head Wo Contrast  Result Date: 01/30/2023 CLINICAL DATA:  Mental status change, unknown cause EXAM: CT HEAD WITHOUT CONTRAST TECHNIQUE: Contiguous axial images were obtained from the base of the skull through the vertex without intravenous contrast. RADIATION DOSE REDUCTION: This exam was performed according to the departmental dose-optimization program which includes automated exposure control, adjustment of the mA and/or kV according to patient size and/or use of iterative reconstruction technique. COMPARISON:  CT head 01/12/2023 FINDINGS: Brain: Cerebral ventricle sizes are concordant with the degree of cerebral volume loss. Patchy and confluent areas of decreased attenuation are noted throughout the deep and periventricular white matter of the cerebral hemispheres bilaterally, compatible with chronic microvascular ischemic disease. No evidence of large-territorial acute infarction. No parenchymal hemorrhage. No mass lesion. No extra-axial collection. No mass effect or midline shift. No hydrocephalus. Basilar cisterns are patent. Vascular: No hyperdense vessel. Skull: No acute fracture or focal lesion. Sinuses/Orbits: Paranasal sinuses and mastoid air cells are clear. Bilateral lens replacement. Otherwise the orbits are unremarkable. Other: None. IMPRESSION: No acute intracranial abnormality. Electronically Signed   By: Tish Frederickson M.D.   On: 01/30/2023 22:07   DG Chest Port 1 View  Result Date: 01/30/2023 CLINICAL DATA:  Sepsis EXAM: PORTABLE CHEST 1 VIEW COMPARISON:  01/14/2023 FINDINGS: Single frontal view of the chest demonstrates an enlarged cardiac silhouette.  There is central vascular congestion, with patchy perihilar airspace disease and small bilateral pleural effusions. No pneumothorax. No acute bony abnormalities. IMPRESSION: 1. Constellation of findings most consistent with congestive heart failure. Electronically Signed   By: Sharlet Salina M.D.   On: 01/30/2023 20:31      IMPRESSION AND PLAN:  Assessment and Plan: * Acute respiratory failure with hypoxia and hypercarbia (HCC) - The patient is being admitted to a progressive unit bed. - We will continue him on BiPAP. - His ABG has been improving. - This is clearly secondary to his acute CHF. - O2 protocol will be followed. - Management is otherwise as below.  Acute on chronic diastolic CHF (congestive heart failure) (HCC) - We will continue diuresis with IV Lasix. - We Will follow serial troponins. - We will follow I's and O's and daily weights. - Cardiology consult will follow be obtained in AM. - I notified Salley Hews about the patient. - 2D echo on 11/29/2022 revealed an EF of 60 to 65% with moderate right and left atrial dilatation.  Essential hypertension - We will continue Cardizem CD and Lopressor.  Dyslipidemia - We will continue statin therapy.  BPH (benign prostatic hyperplasia) - We will continue Flomax.  Type 2 diabetes mellitus without complications (HCC) - The patient will be placed on supplemental coverage with NovoLog. - We will continue Glucotrol XL.  Gout - We will continue allopurinol.    DVT prophylaxis: Lovenox.  Advanced Care Planning:  Code Status: full code.  Family Communication:  The plan of care was discussed in details with the patient (and family). I answered all questions. The patient agreed to proceed with the above mentioned plan. Further management will depend upon hospital course. Disposition Plan: Back to previous home environment Consults called: none.  All the records are reviewed and case discussed with ED provider.  Status is:  Inpatient    At the time of the admission, it appears that the  appropriate admission status for this patient is inpatient.  This is judged to be reasonable and necessary in order to provide the required intensity of service to ensure the patient's safety given the presenting symptoms, physical exam findings and initial radiographic and laboratory data in the context of comorbid conditions.  The patient requires inpatient status due to high intensity of service, high risk of further deterioration and high frequency of surveillance required.  I certify that at the time of admission, it is my clinical judgment that the patient will require inpatient hospital care extending more than 2 midnights.                            Dispo: The patient is from: SNF              Anticipated d/c is to: SNF              Patient currently is not medically stable to d/c.              Difficult to place patient: No  Authorized and performed by: Valente David, MD Total critical care time:    55    minutes. Due to a high probability of clinically significant, life-threatening deterioration, the patient required my highest level of preparedness to intervene emergently and I personally spent this critical care time directly and personally managing the patient.  This critical care time included obtaining a history, examining the patient, pulse oximetry, ordering and review of studies, arranging urgent treatment with development of management plan, evaluation of patient's response to treatment, frequent reassessment, and discussions with other providers. This critical care time was performed to assess and manage the high probability of imminent, life-threatening deterioration that could result in multiorgan failure.  It was exclusive of separately billable procedures and treating other patients and teaching time.    Hannah Beat M.D on 01/31/2023 at 4:52 AM  [facility]   From 7 PM-7 AM, contact  night-coverage [email or web address]  CC: Primary care physician; Corwin Levins, MD

## 2023-01-31 NOTE — Plan of Care (Signed)
Per daughter, improved cognition from early arrival to ED. Makes needs known.  Recent hx of broken back, does not tolerate some positioning r/t spinal surgery.  Forgetful at baseline.  Problem: Education: Goal: Ability to demonstrate management of disease process will improve Outcome: Progressing Goal: Ability to verbalize understanding of medication therapies will improve Outcome: Progressing Goal: Individualized Educational Video(s) Outcome: Progressing   Problem: Activity: Goal: Capacity to carry out activities will improve Outcome: Progressing   Problem: Cardiac: Goal: Ability to achieve and maintain adequate cardiopulmonary perfusion will improve Outcome: Progressing   Problem: Education: Goal: Ability to describe self-care measures that may prevent or decrease complications (Diabetes Survival Skills Education) will improve Outcome: Progressing Goal: Individualized Educational Video(s) Outcome: Progressing   Problem: Coping: Goal: Ability to adjust to condition or change in health will improve Outcome: Progressing   Problem: Fluid Volume: Goal: Ability to maintain a balanced intake and output will improve Outcome: Progressing   Problem: Health Behavior/Discharge Planning: Goal: Ability to identify and utilize available resources and services will improve Outcome: Progressing Goal: Ability to manage health-related needs will improve Outcome: Progressing   Problem: Metabolic: Goal: Ability to maintain appropriate glucose levels will improve Outcome: Progressing   Problem: Nutritional: Goal: Maintenance of adequate nutrition will improve Outcome: Progressing Goal: Progress toward achieving an optimal weight will improve Outcome: Progressing   Problem: Skin Integrity: Goal: Risk for impaired skin integrity will decrease Outcome: Progressing   Problem: Tissue Perfusion: Goal: Adequacy of tissue perfusion will improve Outcome: Progressing   Problem:  Education: Goal: Knowledge of General Education information will improve Description: Including pain rating scale, medication(s)/side effects and non-pharmacologic comfort measures Outcome: Progressing   Problem: Health Behavior/Discharge Planning: Goal: Ability to manage health-related needs will improve Outcome: Progressing   Problem: Clinical Measurements: Goal: Ability to maintain clinical measurements within normal limits will improve Outcome: Progressing Goal: Will remain free from infection Outcome: Progressing Goal: Diagnostic test results will improve Outcome: Progressing Goal: Respiratory complications will improve Outcome: Progressing Goal: Cardiovascular complication will be avoided Outcome: Progressing   Problem: Activity: Goal: Risk for activity intolerance will decrease Outcome: Progressing   Problem: Nutrition: Goal: Adequate nutrition will be maintained Outcome: Progressing   Problem: Coping: Goal: Level of anxiety will decrease Outcome: Progressing   Problem: Elimination: Goal: Will not experience complications related to bowel motility Outcome: Progressing Goal: Will not experience complications related to urinary retention Outcome: Progressing   Problem: Pain Managment: Goal: General experience of comfort will improve Outcome: Progressing   Problem: Safety: Goal: Ability to remain free from injury will improve Outcome: Progressing   Problem: Skin Integrity: Goal: Risk for impaired skin integrity will decrease Outcome: Progressing

## 2023-01-31 NOTE — Progress Notes (Signed)
RT at bedside to find pt on [place] and BiPAP on stby at bedside. Pt tolerating [place] well at this time.

## 2023-01-31 NOTE — Assessment & Plan Note (Signed)
-   We will continue Cardizem CD and Lopressor. 

## 2023-01-31 NOTE — Progress Notes (Signed)
BIPAP set up at bedside for HS & PRN use while sleeping.

## 2023-01-31 NOTE — Consult Note (Signed)
Cardiology Consultation   Patient ID: [name] [name] MRN: [id number]; DOB: April 30, 1946  Admit date: 01/30/2023 Date of Consult: 01/31/2023  PCP:  Corwin Levins, MD   [facility] Providers Cardiologist:  Olga Millers, MD     Patient Profile:   Lynne D Biby is a 77 y.o. male with a hx of permanent atrial fibrillation, R sided heart failure secondary felt secondary to pulmonary HTN (OSA, OHS, possible pulmonary venous HTN), migraines, type 2 DM, HTN, HLD, GERD who is being seen 01/31/2023 for the evaluation of CHF at the request of Dr. Jonathon Bellows.  History of Present Illness:   Mr. Bogie is a 77 year old male with above medical history who is followed by Dr. Jens Som. Per chart review, patient has a long history or permanent atrial fibrillation (at least since 2009). Attempted cardioversion at that time, but he did not hold sinus rhythm. He had an echocardiogram in 02/2008 that showed normal LV function, mild RVE, and mild right atrial enlargement. In 12/2012, patient complained of worsening DOE and pedal edema. Echocardiogram on 12/27/12 showed EF 55%, mild LVH, moderate-severe left atrial dilation, mildly dilated RV with mildly reduced RV systolic function. It was thought that patient had significant pulmonary HTN that was multifactorial and due to OSA, OHS, pulmonary venous HTN. He was admitted for IV diuresis and underwent RHC on 01/08/13 that demonstrated elevated R heart pressures. Later, echocardiogram in 12/2015 showed EF 55-60%,  severe LA dilation. Since then, he has been followed by Dr. Jens Som. He has had chronic DOE and chronic mild orthopnea and pedal edema   Patient was most recently seen by Dr. Jens Som in 07/2022. At that time, he was doing well on demadex 80 mg BID on MWF and 60 mg BID on TTSS. He was also on diltiazem 180 mg and metoprolol tartrate 25 mg BID for rate control and eliquis 5 mg BID for stroke prevention.   Patient was admitted from 11/18/22 - 12/06/22 after he had a  mechanical fall. He was found to have a T12-L1 endplate spine fracture. Underwent ORIF on 4/16. Eliquis was resumed after surgery. That admission, patient's HR was well controlled. He remained on his home diuretics, but developed an AKI and required IV hydration. Echocardiogram on 11/29/22 showed EF 60-65%, no regional wall motion abnormalities, mild LVH, normal RV systolic function, moderate biatrial enlargement. He was discharged off torsemide   Patient was again admitted from 6/5 - 01/19/23 for a complicated UTI and sepsis. On presentation to the ED, patient was in rapid afib, was febrile, and had Low BP. UA consistent with UTI and CT abdomen/pelvis showed left kidney stranding, mild hydronephrosis. Blood and urine cultures showed Klebsiella. Patient was treated with antibiotics. He had AKI and was treated with IV fluids, discharged off diuretics. He was discharged to a SNF.   Patient presented to the ED on 6/23 from his SNF. Staff noted that patient had AMS, hypoxia, and hematuria in indwelling urinary catheter. Initial vital signs showed HR 87 BPM, BP 84/40, oxygen 93% on 6L via [place], RR 27 breaths per minute. He was placed on BiPAP for respiratory distress. Labs in the ED showed K 3.7, creatinine 2.03, albumin <1.5, WBC 9.2, hemoglobin 10.3, platelets 273. UA consistent with UTI. BNP elevated to 230. hsTn 7, 5.  CXR showed findings most consistent with CHF (enlarged cardiac silhouette, central vascular congestion, small bilateral pleural effusions).   Patient's daughter is at bedside. She reports that since he was discharged on 6/12, he  has occasionally complained of shortness of breath and feeling like his oxygen was not working. She has also noticed that he has had worsening ankle and arm edema. She does not think that he has been getting diuretics at his SNF. Patient has not been complaining of chest pain. Patient has dementia and is a poor historian. He reports having mild orthopnea and feeling like his  hands and feet are swollen. Denies chest pain, dizziness, syncope, near syncope   Past Medical History:  Diagnosis Date   Acute right-sided CHF (congestive heart failure) (HCC)    a. 01/2013.   Arthritis    "knees and hands; thoracic area of the spine" (01/05/2013)   BRONCHITIS, CHRONIC 01/02/2009   COMMON MIGRAINE    "none since treating for high BP"   Complication of anesthesia    "aspiration pneumonia after hand OR" (01/05/2013)   DIABETES MELLITUS, TYPE II    GOUT    HYPERLIPIDEMIA    HYPERTENSION    Long term (current) use of anticoagulants    Morbid obesity (HCC)    OBESITY HYPOVENTILATION SYNDROME    a. on home O2.   On home oxygen therapy    OSA (obstructive sleep apnea)    Permanent atrial fibrillation (HCC)    PROSTATE SPECIFIC ANTIGEN, ELEVATED 06/09/2007   Pulmonary HTN (HCC)    a. multifactorial including obstructive sleep apnea, obesity hypoventilation syndrome and possible pulmonary venous hypertension.   SKIN RASH 03/12/2010   Unspecified hearing loss     Past Surgical History:  Procedure Laterality Date   APPENDECTOMY  1974   APPLICATION OF ROBOTIC ASSISTANCE FOR SPINAL PROCEDURE N/A 11/23/2022   Procedure: APPLICATION OF ROBOTIC ASSISTANCE FOR SPINAL PROCEDURE;  Surgeon: Lisbeth Renshaw, MD;  Location: [facility] OR;  Service: Neurosurgery;  Laterality: N/A;   CARDIOVERSION  05/21/2008; ~ 06/2008   HERNIA REPAIR     INGUINAL HERNIA REPAIR Right    RIGHT HEART CATHETERIZATION N/A 01/08/2013   Procedure: RIGHT HEART CATH;  Surgeon: Vesta Mixer, MD;  Location: [facility] CATH LAB;  Service: Cardiovascular;  Laterality: N/A;   TENDON REPAIR Right 2009   "lacerated his tendon and pulley" Dr. Izora Ribas   UMBILICAL HERNIA REPAIR       Home Medications:  Prior to Admission medications   Medication Sig Start Date End Date Taking? Authorizing Provider  acetaminophen (TYLENOL) 500 MG tablet Take 500-1,000 mg by mouth every 6 (six) hours as needed for moderate pain.   Yes [provider]  allopurinol (ZYLOPRIM) 100 MG tablet TAKE 1 TABLET DAILY Patient taking differently: Take 100 mg by mouth daily. 01/25/22  Yes Corwin Levins, MD  atorvastatin (LIPITOR) 20 MG tablet TAKE 1 TABLET DAILY Patient taking differently: Take 20 mg by mouth at bedtime. 01/25/22  Yes Corwin Levins, MD  cholecalciferol (VITAMIN D3) 25 MCG (1000 UNIT) tablet Take 2,000 Units by mouth daily.   Yes [provider]  diltiazem (CARDIZEM CD) 120 MG 24 hr capsule Take 1 capsule (120 mg total) by mouth daily. 01/19/23 01/19/24 Yes Kc, Dayna Barker, MD  ELIQUIS 5 MG TABS tablet TAKE 1 TABLET TWICE A DAY Patient taking differently: Take 5 mg by mouth 2 (two) times daily. 01/25/22  Yes Lewayne Bunting, MD  glipiZIDE (GLUCOTROL XL) 10 MG 24 hr tablet Take 1 tablet (10 mg total) by mouth daily with breakfast. 07/15/22  Yes Corwin Levins, MD  metoprolol tartrate (LOPRESSOR) 25 MG tablet TAKE 1 TABLET(25 MG) BY MOUTH TWICE DAILY Patient taking  differently: Take 25 mg by mouth 2 (two) times daily. 07/12/22  Yes Corwin Levins, MD  OXYGEN Inhale 3-5 L into the lungs daily. 3L Daily  4L Resting  5L Exertion   Yes [provider]  saccharomyces boulardii (FLORASTOR) 250 MG capsule Take 1 capsule (250 mg total) by mouth 2 (two) times daily for 14 days. 01/19/23 02/02/23 Yes Lanae Boast, MD  tamsulosin (FLOMAX) 0.4 MG CAPS capsule Take 1 capsule (0.4 mg total) by mouth daily after supper. 01/19/23  Yes Lanae Boast, MD  traMADol (ULTRAM) 50 MG tablet Take 50 mg by mouth every 6 (six) hours as needed for moderate pain.   Yes [provider]  Lancets MISC Use as directed once daily  E11.9 04/19/17   Corwin Levins, MD    Inpatient Medications: Scheduled Meds:  allopurinol  100 mg Oral Daily   apixaban  5 mg Oral BID   atorvastatin  20 mg Oral Daily   cholecalciferol  2,000 Units Oral Daily   [START ON 02/14/2023] cyanocobalamin  1,000 mcg Intramuscular Q30 days   furosemide  40 mg Intravenous Q12H    insulin aspart  0-5 Units Subcutaneous QHS   insulin aspart  0-9 Units Subcutaneous TID WC   metoprolol tartrate  25 mg Oral BID   saccharomyces boulardii  250 mg Oral BID   tamsulosin  0.4 mg Oral QPC supper   Continuous Infusions:  ceFEPime (MAXIPIME) IV Stopped (01/31/23 0815)   vancomycin     PRN Meds: acetaminophen **OR** acetaminophen, magnesium hydroxide, ondansetron **OR** ondansetron (ZOFRAN) IV  Allergies:    Allergies  Allergen Reactions   Codeine     Altered mental status "goofy"   Heparin Rash     Abdominal rash 01/2013    Social History:   Social History   Socioeconomic History   Marital status: Widowed    Spouse name: Not on file   Number of children: 2   Years of education: Not on file   Highest education level: Not on file  Occupational History   Occupation: Pensions consultant    Employer: [employer]  Tobacco Use   Smoking status: Former    Packs/day: 1.00    Years: 15.00    Additional pack years: 0.00    Total pack years: 15.00    Types: Cigarettes    Quit date: 08/09/1978    Years since quitting: 44.5   Smokeless tobacco: Never   Tobacco comments:    01/05/2013 "quit smoking ~ 40 years ago"  Vaping Use   Vaping Use: Never used  Substance and Sexual Activity   Alcohol use: Yes    Alcohol/week: 0.0 standard drinks of alcohol    Comment: 01/05/2013 "hasn't had a drink since 1980's; never had problem w/it"   Drug use: No   Sexual activity: Not Currently  Other Topics Concern   Not on file  Social History Narrative   Not on file   Social Determinants of Health   Financial Resource Strain: Not on file  Food Insecurity: No Food Insecurity (11/25/2022)   Hunger Vital Sign    Worried About Running Out of Food in the Last Year: Never true    Ran Out of Food in the Last Year: Never true  Transportation Needs: No Transportation Needs (11/25/2022)   PRAPARE - Administrator, Civil Service (Medical): No    Lack of Transportation  (Non-Medical): No  Physical Activity: Not on file  Stress: Not on file  Social Connections: Not on file  Intimate Partner Violence: Not on file    Family History:    Family History  Problem Relation Age of Onset   Alzheimer's disease Father    Prostate cancer Father    Cancer Other        Breast Cancer, <50 yo 1st degree relative   Coronary artery disease Other        Male, 1st degree relative     ROS:  Please see the history of present illness.   All other ROS reviewed and negative.     Physical Exam/Data:   Vitals:   01/31/23 1011 01/31/23 1200 01/31/23 1215 01/31/23 1300  BP: (!) 134/93 118/75 123/82 113/70  Pulse: 97 78 66 79  Resp:  20 16 20   Temp:    97.9 F (36.6 C)  TempSrc:    Oral  SpO2:  95% 98% 97%  Weight:    112.1 kg  Height:    5\' 8"  (1.727 m)    Intake/Output Summary (Last 24 hours) at 01/31/2023 1410 Last data filed at 01/31/2023 1300 Gross per 24 hour  Intake 2150 ml  Output 2300 ml  Net -150 ml      01/31/2023    1:00 PM 01/30/2023    8:12 PM 01/30/2023    7:54 PM  Last 3 Weights  Weight (lbs) 247 lb 2.2 oz 220 lb 116 lb  Weight (kg) 112.1 kg 99.791 kg 52.617 kg     Body mass index is 37.58 kg/m.  General:  Elderly male, laying in the bed with head slightly elevated. No acute distress  HEENT: normal Neck: + JVD  Vascular: RAdial pulses 2+ bilaterally Cardiac:  normal S1, S2; irregular rate and rhythm. No murmurs  Lungs:  diminished breath sounds in lung bases  Abd: soft, nontender. Mildly distended  Ext: 2+ edema in BLE and BUE  Musculoskeletal:  No deformities, BUE and BLE strength normal and equal Skin: warm and dry. Chronic skin thickening in bilateral lower extremities  Neuro:  CNs 2-12 intact, no focal abnormalities noted Psych:  Normal affect   EKG:  The EKG was personally reviewed and demonstrates:  Atrial fibrillation, HR 101 BPM Telemetry:  Telemetry was personally reviewed and demonstrates:  Atrial fibrillation, HR in the  70s-80s   Relevant CV Studies: Cardiac Studies & Procedures       ECHOCARDIOGRAM  ECHOCARDIOGRAM COMPLETE 11/29/2022  Narrative ECHOCARDIOGRAM REPORT    Patient Name:   RENSO SWETT  Date of Exam: 11/29/2022 Medical Rec #:  [id number]  Height:       68.0 in Accession #:    [id number] Weight:       275.0 lb Date of Birth:  [birth year]   BSA:          2.340 m Patient Age:    76 years   BP:           119/75 mmHg Patient Gender: M          HR:           71 bpm. Exam Location:  Inpatient  Procedure: 2D Echo, Cardiac Doppler, Color Doppler and Intracardiac Opacification Agent  Indications:    Abnormal ECG 794.31 / R94.31  History:        Patient has prior history of Echocardiogram examinations, most recent 07/22/2021. CHF, Pulmonary HTN, Arrythmias:Atrial Fibrillation; Risk Factors:Dyslipidemia, Diabetes and Former Smoker.  Sonographer:    Aron Baba Referring Phys: [id number] GEXBMW DAHAL   Sonographer Comments:  Technically difficult study due to poor echo windows. Image acquisition challenging due to patient body habitus and Image acquisition challenging due to respiratory motion. IMPRESSIONS   1. Left ventricular ejection fraction, by estimation, is 60 to 65%. The left ventricle has normal function. The left ventricle has no regional wall motion abnormalities. There is mild concentric left ventricular hypertrophy. Left ventricular diastolic function could not be evaluated. 2. Right ventricular systolic function is normal. The right ventricular size is normal. 3. Left atrial size was moderately dilated. 4. Right atrial size was moderately dilated. 5. The mitral valve was not well visualized. No evidence of mitral valve regurgitation. No evidence of mitral stenosis. 6. The aortic valve is normal in structure. Aortic valve regurgitation is not visualized. No aortic stenosis is present. 7. The inferior vena cava is dilated in size with >50% respiratory variability, suggesting right  atrial pressure of 8 mmHg. 8. Technically very limited echo due to poor sound wave transmission.  FINDINGS Left Ventricle: Left ventricular ejection fraction, by estimation, is 60 to 65%. The left ventricle has normal function. The left ventricle has no regional wall motion abnormalities. Definity contrast agent was given IV to delineate the left ventricular endocardial borders. The left ventricular internal cavity size was normal in size. There is mild concentric left ventricular hypertrophy. Left ventricular diastolic function could not be evaluated due to atrial fibrillation. Left ventricular diastolic function could not be evaluated.  Right Ventricle: The right ventricular size is normal. No increase in right ventricular wall thickness. Right ventricular systolic function is normal.  Left Atrium: Left atrial size was moderately dilated.  Right Atrium: Right atrial size was moderately dilated.  Pericardium: There is no evidence of pericardial effusion.  Mitral Valve: The mitral valve was not well visualized. No evidence of mitral valve regurgitation. No evidence of mitral valve stenosis.  Tricuspid Valve: The tricuspid valve is not well visualized. Tricuspid valve regurgitation is not demonstrated. No evidence of tricuspid stenosis.  Aortic Valve: The aortic valve is normal in structure. Aortic valve regurgitation is not visualized. No aortic stenosis is present.  Pulmonic Valve: The pulmonic valve was not well visualized. Pulmonic valve regurgitation is not visualized. No evidence of pulmonic stenosis.  Aorta: The aortic root is normal in size and structure.  Venous: The inferior vena cava is dilated in size with greater than 50% respiratory variability, suggesting right atrial pressure of 8 mmHg.  IAS/Shunts: No atrial level shunt detected by color flow Doppler.   LEFT VENTRICLE PLAX 2D LVIDd:         4.60 cm   Diastology LVIDs:         3.10 cm   LV e' medial:    6.85 cm/s LV  PW:         1.20 cm   LV E/e' medial:  14.6 LV IVS:        1.20 cm   LV e' lateral:   10.20 cm/s LVOT diam:     2.20 cm   LV E/e' lateral: 9.8 LV SV:         66 LV SV Index:   28 LVOT Area:     3.80 cm   RIGHT VENTRICLE RV S prime:     13.30 cm/s TAPSE (M-mode): 1.7 cm  LEFT ATRIUM              Index        RIGHT ATRIUM           Index LA diam:  4.30 cm  1.84 cm/m   RA Area:     35.90 cm LA Vol (A2C):   165.0 ml 70.51 ml/m  RA Volume:   154.00 ml 65.81 ml/m LA Vol (A4C):   162.0 ml 69.23 ml/m LA Biplane Vol: 165.0 ml 70.51 ml/m AORTIC VALVE LVOT Vmax:   89.20 cm/s LVOT Vmean:  62.550 cm/s LVOT VTI:    0.173 m  AORTA Ao Root diam: 3.70 cm Ao Asc diam:  3.60 cm  MITRAL VALVE MV Area (PHT): 3.68 cm    SHUNTS MV Decel Time: 206 msec    Systemic VTI:  0.17 m MV E velocity: 99.80 cm/s  Systemic Diam: 2.20 cm  Arvilla Meres MD Electronically signed by Arvilla Meres MD Signature Date/Time: 11/29/2022/4:30:59 PM    Final               Laboratory Data:  High Sensitivity Troponin:   Recent Labs  Lab 02-08-23 2220 02/08/23 2355  TROPONINIHS 7 5     Chemistry Recent Labs  Lab 02-08-23 2004 February 08, 2023 2140 01/31/23 0000 01/31/23 1217 01/31/23 1235  NA 143   < > 143 141 143  K 3.7   < > 3.6 4.2 4.3  CL 103  --   --  104  --   CO2 31  --   --  31  --   GLUCOSE 141*  --   --  196*  --   BUN 18  --   --  21  --   CREATININE 2.03*  --   --  1.97*  --   CALCIUM 9.0  --   --  8.8*  --   GFRNONAA 33*  --   --  34*  --   ANIONGAP 9  --   --  6  --    < > = values in this interval not displayed.    Recent Labs  Lab 02/08/23 2004  PROT 5.6*  ALBUMIN <1.5*  AST 19  ALT 22  ALKPHOS 49  BILITOT 0.8   Lipids No results for input(s): "CHOL", "TRIG", "HDL", "LABVLDL", "LDLCALC", "CHOLHDL" in the last 168 hours.  Hematology Recent Labs  Lab 02-08-2023 2004 02-08-2023 2140 01/31/23 0000 01/31/23 1235  WBC 9.2  --   --   --   RBC 3.50*  --   --    --   HGB 10.3* 10.5* 9.9* 11.2*  HCT 33.7* 31.0* 29.0* 33.0*  MCV 96.3  --   --   --   MCH 29.4  --   --   --   MCHC 30.6  --   --   --   RDW 17.0*  --   --   --   PLT 273  --   --   --    Thyroid No results for input(s): "TSH", "FREET4" in the last 168 hours.  BNP Recent Labs  Lab Feb 08, 2023 2220  BNP 230.3*    DDimer No results for input(s): "DDIMER" in the last 168 hours.   Radiology/Studies:  US Renal  Result Date: 02-08-2023 CLINICAL DATA:  aki EXAM: RENAL / URINARY TRACT ULTRASOUND COMPLETE COMPARISON:  CT abdomen pelvis 01/12/2024 FINDINGS: Right Kidney: Renal measurements: 10.4 x 5 x 4.7 cm = volume: 127 mL. Renal cortical scarring. Echogenicity increased. No mass or hydronephrosis visualized. Left Kidney: Renal measurements: 13.5 x 6.2 x 7 cm = volume: 306 mL. Echogenicity increased. Multiple simple renal cysts as well as a single minimally complex thinly septated cyst  noted-no further follow-up indicated. No solid mass or hydronephrosis visualized. Urinary bladder: Appears normal for degree of bladder distention. Other: None. IMPRESSION: Increased renal echogenicity suggestive of renal parenchymal disease. Electronically Signed   By: Tish Frederickson M.D.   On: 01/30/2023 23:32   CT Head Wo Contrast  Result Date: 01/30/2023 CLINICAL DATA:  Mental status change, unknown cause EXAM: CT HEAD WITHOUT CONTRAST TECHNIQUE: Contiguous axial images were obtained from the base of the skull through the vertex without intravenous contrast. RADIATION DOSE REDUCTION: This exam was performed according to the departmental dose-optimization program which includes automated exposure control, adjustment of the mA and/or kV according to patient size and/or use of iterative reconstruction technique. COMPARISON:  CT head 01/12/2023 FINDINGS: Brain: Cerebral ventricle sizes are concordant with the degree of cerebral volume loss. Patchy and confluent areas of decreased attenuation are noted throughout the  deep and periventricular white matter of the cerebral hemispheres bilaterally, compatible with chronic microvascular ischemic disease. No evidence of large-territorial acute infarction. No parenchymal hemorrhage. No mass lesion. No extra-axial collection. No mass effect or midline shift. No hydrocephalus. Basilar cisterns are patent. Vascular: No hyperdense vessel. Skull: No acute fracture or focal lesion. Sinuses/Orbits: Paranasal sinuses and mastoid air cells are clear. Bilateral lens replacement. Otherwise the orbits are unremarkable. Other: None. IMPRESSION: No acute intracranial abnormality. Electronically Signed   By: Tish Frederickson M.D.   On: 01/30/2023 22:07   DG Chest Port 1 View  Result Date: 01/30/2023 CLINICAL DATA:  Sepsis EXAM: PORTABLE CHEST 1 VIEW COMPARISON:  01/14/2023 FINDINGS: Single frontal view of the chest demonstrates an enlarged cardiac silhouette. There is central vascular congestion, with patchy perihilar airspace disease and small bilateral pleural effusions. No pneumothorax. No acute bony abnormalities. IMPRESSION: 1. Constellation of findings most consistent with congestive heart failure. Electronically Signed   By: Sharlet Salina M.D.   On: 01/30/2023 20:31     Assessment and Plan:   Acute on Chronic Diastolic Heart Failure  R sided Heart Failure  - In the past, patient was diagnosed with right heart failure felt to be secondary to pulmonary HTN (due to OSA, OHS, and possible pulmonary venous HTN). RHC in 01/2013 demonstarted elevated R heart filling pressures. Since then, he has been on diuretics - Most recent echo from 11/2022 was a technically difficult study but showed EF 60-65%, mild LVH, normal RV systolic function, moderate biatrial enlargement  - Patient recently admitted with sepsis- developed AKI that admission and was treated with IV fluids. Discharged off diuretics.  - Since being discharged on 6/12, patient's daughter reports that he has been complaining of  shortness of breath, ankle edema, and upper extremity edema. She does not think he has been getting diuretics at SNF - BNP elevated to 230 (up from 158 in 11/2022). CXR showed enlarged cardiac silhouette, central vascular congestion, small bilateral pleural effusions. hsTn negative x2  - Patient received 1 dose of IV lasix 40 mg this AM- output 2.3 L urine  - Continue IV lasix 40 mg BID. Strict I/Os, daily weights. Daily BMPs to assess renal function and electrolytes on lasix   Permanent Atrial Fibrillation  - Patient was hypotensive on admission, and home diltiazem 120 mg daily has been held. Remains on metoprolol 25 mg BID - Per telemetry, HR is well controlled on metoprolol  - Continue eliquis 5 mg BID. Note, patient did have blood-tinged urine and dried blood near his foley. Hemoglobin stable. OK to continue eliquis for now  AKI on CKD stage  IIIa - Baseline creatinine approx 1.5-1.6 - Creatinine 2.03 on admission. Stable at 1.97 today  - Follow renal function with daily BMPs while on lasix   OSA  - Encouraged use of BiPAP  Otherwise per primary  - Suspected Sepsis due to UTI  - Acute metabolic encephalopathy  - BPH - Type 2 DM - Gout  - Pressure ulcers   Risk Assessment/Risk Scores:   New York Heart Association (NYHA) Functional Class NYHA Class IV  CHA2DS2-VASc Score = 5  This indicates a 7.2% annual risk of stroke. The patient's score is based upon:   For questions or updates, please contact [facility] Please consult [email or web address] for contact info under    Signed, Jonita Albee, PA-C  01/31/2023 2:10 PM

## 2023-01-31 NOTE — Assessment & Plan Note (Signed)
-   We will continue statin therapy. 

## 2023-01-31 NOTE — Assessment & Plan Note (Addendum)
continue allopurinol.  Chronic back pain continue with as needed tramadol and will decrease gabapentin to 100 mg at bedtime to prevent sedation.

## 2023-01-31 NOTE — Assessment & Plan Note (Signed)
-   The patient is being admitted to a progressive unit bed. - We will continue him on BiPAP. - His ABG has been improving. - This is clearly secondary to his acute CHF. - O2 protocol will be followed. - Management is otherwise as below.

## 2023-01-31 NOTE — Progress Notes (Addendum)
Dr. Dayna Barker notified by secure chat that [name] [name] has some significant skin issues including: State IV back of scrotum (slough); State III R Buttock; Stage IV L Buttock at skin flap (purple will not blanch, top skin layer open); ulceration and erosion of glans r/t foley tubing (too tight on arrival, changed); scabbed area back of right leg - looks like pressure where calf hits back of wheelchair. Wound consult ordered per protocol. Overall peri area is yeasty and per his daughter they have been treating him for yeast topically, requested oral treatment to improve overall condition if possible. Area washed and dried and scrotum in a pillow case sling to prevent more irritation and skin break down. PT reports this is all very painful. Dr. Dayna Barker acknowledged update.

## 2023-01-31 NOTE — Assessment & Plan Note (Signed)
Continue insulin sliding scale for glucose cover and monitoring Continue with statin therapy.  

## 2023-01-31 NOTE — Progress Notes (Signed)
Pharmacy Antibiotic Note  [name] [name] is a 77 y.o. male for which pharmacy has been consulted for cefepime and vancomycin dosing for sepsis.  Renal function worsening, afebrile, WBC WNL  Plan: Change vanc 750 IV Q24H for AUC 452 using SCr 1.97 Continue cefepime 2gm IV Q12H Monitor renal fxn, clinical progress, vanc levels as indicated  Height: 5\' 8"  (172.7 cm) Weight: 112.1 kg (247 lb 2.2 oz) IBW/kg (Calculated) : 68.4  Temp (24hrs), Avg:98.2 F (36.8 C), Min:97 F (36.1 C), [name]:98.8 F (37.1 C)  Recent Labs  Lab 01/30/23 2004 01/30/23 2220 01/31/23 1217  WBC 9.2  --   --   CREATININE 2.03*  --  1.97*  LATICACIDVEN 1.4 1.3  --     Estimated Creatinine Clearance: 38.2 mL/min (A) (by C-G formula based on SCr of 1.97 mg/dL (H)).    Allergies  Allergen Reactions   Codeine     Altered mental status "goofy"   Heparin Rash     Abdominal rash 01/2013    Vanc 6/23 >> Cefepime 6/23 >> Flagyl x1 6/23   6/23 BCx -  6/23 UCx -   [name] D. Laney Potash, PharmD, BCPS, BCCCP 01/31/2023, 2:06 PM

## 2023-01-31 NOTE — Consult Note (Signed)
Consultation Note Date: 01/31/2023   Patient Name: [name] [name]  DOB: 08-Jan-1946  MRN: [id number]  Age / Sex: 77 y.o., male  PCP: Corwin Levins, MD Referring Physician: Lanae Boast, MD  Reason for Consultation: Establishing goals of care  HPI/Patient Profile: 77 y.o. male  with past medical history of diastolic CHF, migraine, type 2 diabetes mellitus, hypertension, dyslipidemia, GERD, OSA and pulmonary hypertension admitted on 01/30/2023 with worsening dyspnea, orthopnea, PND, dyspnea on exertion, bilateral edema in lower extremities.   Patient was admitted for acute on chronic diastolic CHF, acute hypoxic and hypercapnic respiratory failure.  Patient has been admitted 3 times in the past 6 months, most recently less than 30 days ago. PMT has been consulted to assist with goals of care conversation.  Clinical Assessment and Goals of Care:  I have reviewed medical records including EPIC notes, labs and imaging, assessed the patient and then met at the bedside with patient's daughter [name] and son Brett Canales to discuss diagnosis prognosis, GOC, EOL wishes, disposition and options.  I introduced Palliative Medicine as specialized medical care for people living with serious illness. It focuses on providing relief from the symptoms and stress of a serious illness. The goal is to improve quality of life for both the patient and the family.  We discussed a brief life review of the patient and then focused on their current illness.   I attempted to elicit values and goals of care important to the patient.    Medical History Review and Understanding:  Patient's children have a good understanding the severity of his illness.  Patient himself is more confused overall given his dementia.  He declines having any questions about his medical care.  Social History: Patient has 1 son and 1 daughter.  He previously enjoyed spending time  with his great-grandchildren, doing puzzles, and playing with the dog.  He reports feeling more discouraged lately.  He has been at a SNF for the past week and a half, with another SNF stay recently as well.  He is a man of faith.  Functional and Nutritional State: Patient has been declining rapidly since April.  He had a fracture in his back that instigated this sharp decline, as he now rarely wants to get up due to feeling miserable and in pain.  He has not been eating very much at all, only having a few bites of fruit while he is at the hospital or any nursing facility.  He did eat well while at home.  He is now only able to walk short distances with a walker.  Palliative Symptoms: Pain, 5 out of 10  Advance Directives: A detailed discussion regarding advanced directives was had.  No living will or HCPOA on file.  Code Status: Concepts specific to code status, artifical feeding and hydration, and rehospitalization were considered and discussed.   Discussion: Patient's son and daughter share that he has previously said he wants "all measures" done only if aggressive interventions would bring him back to something good."  I explored what the patient would consider a good outcome.  They feel he would want to live for more years, though they are concerned about his current quality of life and whether he would find this acceptable.  He does not seem to get any enjoyment from anything in his daily life anymore.  He is miserable most of the time due to his pain.  Attempted to explore patient's thoughts as well.  He states that he does not worry  about the future, while also stating that he does worry sometimes.  He is not sure if he would still want interventions such as CPR and mechanical ventilation.  He just "takes it as it comes." I explored patient's previous symptom management strategies. Emphasized the importance of ongoing discussions given patient's failure to thrive and ongoing decline, as  anything can happen at any time.   The difference between aggressive medical intervention and comfort care was considered in light of the patient's goals of care. Hospice and Palliative Care services outpatient were explained and offered.   Discussed the importance of continued conversation with family and the medical providers regarding overall plan of care and treatment options, ensuring decisions are within the context of the patient's values and GOCs.   Questions and concerns were addressed.  Hard Choices booklet left for review. The family was encouraged to call with questions or concerns.  PMT will continue to support holistically.   SUMMARY OF RECOMMENDATIONS   -Continue full code/full scope treatment for now -Ongoing goals of care discussions pending clinical course -Tylenol 1000 mg 3 times daily scheduled for back pain -Psychosocial and emotional support provided -PMT will continue to follow and support  Prognosis:  Poor long-term prognosis  Discharge Planning: To Be Determined      Primary Diagnoses: Present on Admission:  Acute on chronic diastolic CHF (congestive heart failure) (HCC)  Essential hypertension  Gout   Physical Exam Vitals and nursing note reviewed.  Constitutional:      General: He is not in acute distress.    Appearance: He is obese. He is ill-appearing.     Interventions: Nasal cannula in place.  Cardiovascular:     Rate and Rhythm: Normal rate.  Pulmonary:     Effort: Pulmonary effort is normal. No respiratory distress.  Neurological:     Mental Status: He is alert. He is disoriented.     Vital Signs: BP 113/70 (BP Location: Left Arm)   Pulse 79   Temp 97.9 F (36.6 C) (Oral)   Resp 20   Ht 5\' 8"  (1.727 m)   Wt 112.1 kg   SpO2 97%   BMI 37.58 kg/m  Pain Scale: 0-10   Pain Score: 0-No pain   SpO2: SpO2: 97 % O2 Device:SpO2: 97 % O2 Flow Rate: .O2 Flow Rate (L/min): 5 L/min   Palliative Assessment/Data: 40%     MDM:  high    Taressa Rauh Jeni Salles, PA-C  Palliative Medicine Team Team phone # [phone]  Thank you for allowing the Palliative Medicine Team to assist in the care of this patient. Please utilize secure chat with additional questions, if there is no response within 30 minutes please call the above phone number.  Palliative Medicine Team providers are available by phone from 7am to 7pm daily and can be reached through the team cell phone.  Should this patient require assistance outside of these hours, please call the patient's attending physician.

## 2023-01-31 NOTE — Consult Note (Addendum)
WOC Nurse Consult Note: Reason for Consult: Consult requested for buttocks.   Wound type: Right buttock with chronic Stage 3 pressure injury; red and moist, mod amt tan drainage, 3X3X.2cm Bilat buttocks and lower back with patchy areas of partial thickness skin loss and red macular papular rash; appearance is consistent with moisture associated skin damage and candidiasis.  ICD-10 CM Codes for Irritant Dermatitis L24A0 - Due to friction or contact with body fluids; unspecified L24A2 - Due to fecal, urinary or dual incontinence Pressure Injury POA: Yes Dressing procedure/placement/frequency: Topical treatment orders provided for bedside nurses to perform as follows: 1. Cut piece of Aqaucel and tuck into right buttock wound Q day, then cover with foam dressing. Change foam dressing Q 3 days or PRN soiling 2. Apply barrier cream and antifungal powder with each turning and cleaning session. Please re-consult if further assistance is needed.  Thank-you,  Cammie Mcgee MSN, RN, CWOCN, [place], CNS [phone]

## 2023-01-31 NOTE — Progress Notes (Signed)
Pt taken of BIPAP per MD request, pt is more responsive and NAD. ETCO2 cannula in place reading 36. Sat 92-94%, will cont to monitor.  [name] [name]

## 2023-01-31 NOTE — Hospital Course (Addendum)
Mr. Yanik was admitted to the hospital with the working diagnosis of heart failure exacerbation.   77 yom w/ diastolic CHF, migraine, T2DM,hypertension, dyslipidemia, GERD, OSA and pulmonary hypertension,presented to ED W/ acute onset of worsening dyspnea with associated orthopnea and paroxysmal nocturnal dyspnea as well as dyspnea on exertion and lower extremity edema. EMS was called at [facility] because patient had AMS, hypoxia and hematuria initially indwelling urinary catheter.  He is complaining of back pain from injury a couple of weeks ago.  For EMS EKG showed A-fib with heart rate 80-90, respiration 24 ETCO2 47 CBG 163 pulse ox 95% on 8 L normally 95% on 4 L, was not on oxygen on arrival.  In the ED patient was hypotensive on arrival, concerning for sepsis. He was c/o dysuria and urinary frequency and urgency. initially 84/40 and later on 91/62, RR 27 and later 19 and pulse 70 was 93% on 6 L O2>was placed on BiPAP for respiratory distress and pulse extremity was 94% on 40% FiO2 on BiPAP.   Labs: Creatinine of 2.03 and glucose of 141,albumin <1.5.Lactic acid was 1.4 and CBC showed anemia close to baseline.  INR was 1.9 PT 21.8 with a PTT of 40.  Influenza antigens, COVID-19 and RSV PCR's came back negative.  UA was positive for UTI.Urine and blood cultures were drawn.  ZOX:WRUEAV fibrillation with a rate of 101  Imaging: Portable chest x-ray showed finding consistent with CHF, however BNP not significantly elevated, CT head no acute finding.  Renal ultrasound increased echogenicity ABG suggestive of respiratory acidosis.  In ED given 1.75 L of IV LR, IV vancomycin, Flagyl and cefepime for initial hypotension and suspected sepsis. Patient's blood pressure improved, Dr Marchelle Gearing from critical care was consulted, repeat blood gas showed improvement in pH  Patient is admitted under [facility] for further management.    06/27 patient responding well to diuresis for volume overload.   06/28 patient with improved volume status, possible transition to oral loop diuretic tomorrow.  Pending transfer to SNF. 06/29 patient is medically stable pending transfer to SNF.  06/30 patient continue medically stable, plan to transfer to SNF when bed available.  07/01 continue stable for transfer to SNF.

## 2023-01-31 NOTE — Assessment & Plan Note (Deleted)
-   We will continue diuresis with IV Lasix. - We Will follow serial troponins. - We will follow I's and O's and daily weights. - Cardiology consult will follow be obtained in AM. - I notified Salley Hews about the patient. - 2D echo on 11/29/2022 revealed an EF of 60 to 65% with moderate right and left atrial dilatation.

## 2023-01-31 NOTE — Assessment & Plan Note (Addendum)
Continue with flomax, he has foley catheter in place, he has failed voiding trials.  Follow up with Urology as outpatient.

## 2023-02-01 DIAGNOSIS — N3000 Acute cystitis without hematuria: Secondary | ICD-10-CM | POA: Diagnosis not present

## 2023-02-01 DIAGNOSIS — E46 Unspecified protein-calorie malnutrition: Secondary | ICD-10-CM

## 2023-02-01 DIAGNOSIS — R601 Generalized edema: Secondary | ICD-10-CM | POA: Diagnosis not present

## 2023-02-01 DIAGNOSIS — Z7189 Other specified counseling: Secondary | ICD-10-CM | POA: Diagnosis not present

## 2023-02-01 DIAGNOSIS — I5033 Acute on chronic diastolic (congestive) heart failure: Secondary | ICD-10-CM | POA: Diagnosis not present

## 2023-02-01 DIAGNOSIS — J9602 Acute respiratory failure with hypercapnia: Secondary | ICD-10-CM | POA: Diagnosis not present

## 2023-02-01 DIAGNOSIS — J9601 Acute respiratory failure with hypoxia: Secondary | ICD-10-CM | POA: Diagnosis not present

## 2023-02-01 LAB — BLOOD GAS, ARTERIAL
Acid-Base Excess: 10.4 mmol/L — ABNORMAL HIGH (ref 0.0–2.0)
Bicarbonate: 36 mmol/L — ABNORMAL HIGH (ref 20.0–28.0)
O2 Saturation: 98.1 %
Patient temperature: 37.1
pCO2 arterial: 53 mmHg — ABNORMAL HIGH (ref 32–48)
pH, Arterial: 7.44 (ref 7.35–7.45)
pO2, Arterial: 76 mmHg — ABNORMAL LOW (ref 83–108)

## 2023-02-01 LAB — BASIC METABOLIC PANEL
Anion gap: 7 (ref 5–15)
BUN: 29 mg/dL — ABNORMAL HIGH (ref 8–23)
CO2: 30 mmol/L (ref 22–32)
Calcium: 8.6 mg/dL — ABNORMAL LOW (ref 8.9–10.3)
Chloride: 103 mmol/L (ref 98–111)
Creatinine, Ser: 2.12 mg/dL — ABNORMAL HIGH (ref 0.61–1.24)
GFR, Estimated: 31 mL/min — ABNORMAL LOW (ref >60)
Glucose, Bld: 197 mg/dL — ABNORMAL HIGH (ref 70–99)
Potassium: 3.6 mmol/L (ref 3.5–5.1)
Sodium: 140 mmol/L (ref 135–145)

## 2023-02-01 LAB — URINE CULTURE: Culture: NO GROWTH

## 2023-02-01 LAB — GLUCOSE, CAPILLARY
Glucose-Capillary: 172 mg/dL — ABNORMAL HIGH (ref 70–99)
Glucose-Capillary: 146 mg/dL — ABNORMAL HIGH (ref 70–99)
Glucose-Capillary: 89 mg/dL (ref 70–99)
Glucose-Capillary: 88 mg/dL (ref 70–99)

## 2023-02-01 MED ORDER — FUROSEMIDE 10 MG/ML IJ SOLN
40.0000 mg | Freq: Two times a day (BID) | INTRAMUSCULAR | Status: DC
  Administered 2023-02-01 – 2023-02-05 (×8): 40 mg via INTRAVENOUS
  Filled 2023-02-01 (×9): qty 4

## 2023-02-01 MED ORDER — MAGNESIUM SULFATE IN D5W 1-5 GM/100ML-% IV SOLN
1.0000 g | Freq: Once | INTRAVENOUS | Status: AC
  Administered 2023-02-01: 1 g via INTRAVENOUS
  Filled 2023-02-01: qty 100

## 2023-02-01 MED ORDER — TRAMADOL HCL 50 MG PO TABS
50.0000 mg | ORAL_TABLET | Freq: Three times a day (TID) | ORAL | Status: DC | PRN
  Administered 2023-02-02 – 2023-02-03 (×3): 50 mg via ORAL
  Filled 2023-02-01 (×3): qty 1

## 2023-02-01 MED ORDER — ALBUMIN HUMAN 25 % IV SOLN: 12.5000 g | Freq: Once | INTRAVENOUS | Status: DC

## 2023-02-01 MED ORDER — ALBUMIN HUMAN 25 % IV SOLN
12.5000 g | Freq: Once | INTRAVENOUS | Status: AC
  Administered 2023-02-01: 12.5 g via INTRAVENOUS
  Filled 2023-02-01: qty 50

## 2023-02-01 MED ORDER — POTASSIUM CHLORIDE CRYS ER 20 MEQ PO TBCR
40.0000 meq | EXTENDED_RELEASE_TABLET | Freq: Once | ORAL | Status: AC
  Administered 2023-02-01: 40 meq via ORAL
  Filled 2023-02-01: qty 2

## 2023-02-01 MED ORDER — FUROSEMIDE 10 MG/ML IJ SOLN
40.0000 mg | Freq: Every day | INTRAMUSCULAR | Status: DC
Start: 2023-02-02 — End: 2023-02-01

## 2023-02-01 NOTE — Progress Notes (Signed)
Bipap settings changed to 18/8 auto titrate due to patient consistently desaturating on 12/6

## 2023-02-01 NOTE — Progress Notes (Signed)
Patient has refused to utilize Bipap for the night. NAD noted

## 2023-02-01 NOTE — Consult Note (Signed)
   [facility] CM Inpatient Consult   02/01/2023  [name] [name] [birth year] [id number]  [facility] [[facility]]  Accountable Care Organization [ACO] Patient: [name]  Primary Care Provider:  Corwin Levins, MD with  at [facility] which is listed to provide the transition of care follow up.   Patient screened for hospitalization with noted extreme high risk score for unplanned readmission risk length of stay and to assess for potential [facility]  [[facility]] Care Management service needs for post hospital transition for care coordination.  Review of patient's electronic medical record reveals patient was recently at [facility] for SNF rehab. Discussed with progression team. Patient is resting on rounds and family member at bedside.  Patient is currently on BiPAP, currently for acute respiratory failure with hypoxia.  Plan:  Continue to follow progress and disposition to assess for post hospital community care coordination/management needs.  Referral request for community care coordination: to be determined with disposition  Of note, [facility] Care Management/Population Health does not replace or interfere with any arrangements made by the Inpatient Transition of Care team.  For questions contact:   Charlesetta Shanks, RN BSN CCM [facility]  [phone] business mobile phone Toll free office [phone]  *Concierge Line  [phone] Fax number: [phone] [email or web address]

## 2023-02-01 NOTE — Plan of Care (Signed)

## 2023-02-01 NOTE — Progress Notes (Signed)
PROGRESS NOTE [name] [name]  EXB:[id number] DOB: July 26, 1946 DOA: 01/30/2023 PCP: Corwin Levins, MD  Brief Narrative/Hospital Course: 35 yom w/ diastolic CHF, migraine, T2DM,hypertension, dyslipidemia, GERD, OSA and pulmonary hypertension,presented to ED W/ acute onset of worsening dyspnea with associated orthopnea and paroxysmal nocturnal dyspnea as well as dyspnea on exertion and lower extremity edema. EMS was called at [facility] because patient had AMS, hypoxia and hematuria initially indwelling urinary catheter.  He is complaining of back pain from injury a couple of weeks ago.  For EMS EKG showed A-fib with heart rate 80-90, respiration 24 ETCO2 47 CBG 163 pulse ox 95% on 8 L normally 95% on 4 L, was not on oxygen on arrival.  In the ED patient was hypotensive on arrival, concerning for sepsis. He was c/o dysuria and urinary frequency and urgency. initially 84/40 and later on 91/62, RR 27 and later 19 and pulse 70 was 93% on 6 L O2>was placed on BiPAP for respiratory distress and pulse extremity was 94% on 40% FiO2 on BiPAP.  Labs: Creatinine of 2.03 and glucose of 141,albumin <1.5.Lactic acid was 1.4 and CBC showed anemia close to baseline.  INR was 1.9 PT 21.8 with a PTT of 40.  Influenza antigens, COVID-19 and RSV PCR's came back negative.  UA was positive for UTI.Urine and blood cultures were drawn. NUU:VOZDGU fibrillation with a rate of 101 Imaging: Portable chest x-ray showed finding consistent with CHF, however BNP not significantly elevated, CT head no acute finding.  Renal ultrasound increased echogenicity ABG suggestive of respiratory acidosis.  In ED given 1.75 L of IV LR, IV vancomycin, Flagyl and cefepime for initial hypotension and suspected sepsis. Patient's blood pressure improved, Dr Marchelle Gearing from critical care was consulted, repeat blood gas showed improvement in pH  Patient is admitted under [facility] for further management.      Subjective: Seen this am He feels  well and much better aaox3 and interacting well Used bipap last night Complains of pain in the back but somewhat better this morning  Assessment and Plan: Principal Problem:   Acute respiratory failure with hypoxia and hypercarbia (HCC) Active Problems:   Acute on chronic diastolic CHF (congestive heart failure) (HCC)   Essential hypertension   Dyslipidemia   BPH (benign prostatic hyperplasia)   OSA (obstructive sleep apnea)   Gout   Type 2 diabetes mellitus without complications (HCC)   Acute respiratory failure with hypoxia and hypercapnia (HCC)   Acute on chronic respiratory failure with hypoxia and hypercapnia (HCC)   Acute pulmonary edema (HCC)  Acute on chronic hypoxic and hypercapnic respiratory failure with acute respiratory distress: At baseline on BiPAP at bedtime and 4 l [place].Initially needed BiPAP for respiratory distress subsequently weaned down to 4 L  [place]> but repeat blood gas showed CO2 retention with pCO2 in 70s on VBG, PCCM consulted placed later on BiPAP with improvement.  Used BiPAP overnight continues to.  Continue diuretics as below per cardiology   Acute metabolic encephalopathy: Likely multifactorial in the setting of hypercapnia, UTI.  CT head no acute finding.  Initially needing BiPAP.  Not more alert awake today.  Continue supportive care   Acute on chronic diastolic CHF Acute pulmonary edema: Troponin negative x 2 7, 5 BNP 230, chest x-ray shows features of CHF.Recent echo from 11/27/2023 EF 60 to 60%.  Cardiology following closely, leg swelling,Breathing is improvED-poor given low Alenini of less than 1.5 low oncotic pressure for diuresis.  Giving albumin prior to renal  dosing of Lasix, dietitian consult protein supplementation. Continue diuresis/ALBUMIN  and GDMT  per cardiology. Cont to monitor daily I/O,weight, electrolytes and net balance as below.Keep on  salt/fluid restricted diet and monitor in tele. Net IO Since Admission: -554.14 mL [02/01/23 1104]   Filed Weights   01/30/23 2012 01/31/23 1300 02/01/23 0350  Weight: 99.8 kg 112.1 kg 113.1 kg    Recent Labs  Lab 01/30/23 2004 01/30/23 2140 01/30/23 2220 01/31/23 0000 01/31/23 1217 01/31/23 1235 01/31/23 1806 02/01/23 0046  BNP  --   --  230.3*  --   --   --   --   --   BUN 18  --   --   --  21  --  25* 29*  CREATININE 2.03*  --   --   --  1.97*  --  1.92* 2.12*  K 3.7   < >  --  3.6 4.2 4.3 4.4 3.6   < > = values in this interval not displayed.     AKI on CKD 3a: Baseline creatinine 1.5-1.6, 2 on admit. Recheck bmp creatinine slightly trending up, likely elevated prior to diuretics.  DC vancomycin.  Monitor function closely Recent Labs    01/14/23 0827 01/15/23 0122 01/16/23 0119 01/17/23 0117 01/18/23 0041 01/19/23 0021 01/30/23 2004 01/31/23 1217 01/31/23 1806 02/01/23 0046  BUN 25* 25* 25* 22 19 18 18 21  25* 29*  CREATININE 1.97* 1.83* 1.65* 1.63* 1.47* 1.55* 2.03* 1.97* 1.92* 2.12*  CO2 26 26 27 26 26 26 31 31 30  30    Suspected sepsis due to UTI: Initially hypotensive, now improved.  UA abnormal, although no leukocytosis on empiric cefepime, vancomycin> since blood culture negative and has AKI discontinue vancomycin minimize nephrotoxic medications.   Recent Labs  Lab 01/30/23 2004 01/30/23 2220 01/31/23 1806  WBC 9.2  --  6.6  LATICACIDVEN 1.4 1.3  --     Chronic atrial fibrillation Essential hypertension:  Due to initial hypotension will hold Cardizem.  Rate controlled.  Continue metoprolol with holding parameters.Cont Eliquis.  Hyperlipidemia: continue statin  BPH: continue Flomax  Back pain Chronic: Rapid decline since April after fracturing his back.  Continue scheduled Tylenol, lidocaine patch.  Continue pain management-resume tramadol from previous admission, minimize sedatives narcotics as possible  Type 2 diabetes mellitus: Blood sugar fairly controlled, Will hold OHA keep on SSI for now Recent Labs  Lab 01/31/23 0732 01/31/23 1215  01/31/23 1636 01/31/23 2129 02/01/23 0603  GLUCAP 153* 160* 213* 259* 172*    Gout: Continue labetalol  Normocytic anemia: hemoglobin 9 to 10 g range , likely from chronic disease, stable.  Monitor  Recent admission 6/5-6/12 for Klebsiella bacteremia sepsis and UTI Recurrent hospitalization, advanced age: consulted palliative care. PTOT consult.   Class II Obesity:Patient's Body mass index is 37.91 kg/m. : Will benefit with PCP follow-up, weight loss  healthy lifestyle and outpatient sleep evaluation.  Multiple pressure ulcers, POA:At multiple sites right buttock stage III, left buttock deep tissue injury, unstageable and perineum posterior. See below.  Continue wound care, cont  Diflucan for yeast infection Pressure Injury 01/31/23 Buttocks Right Stage 3 -  Full thickness tissue loss. Subcutaneous fat may be visible but bone, tendon or muscle are NOT exposed. (Active)  01/31/23   Location: Buttocks  Location Orientation: Right  Staging: Stage 3 -  Full thickness tissue loss. Subcutaneous fat may be visible but bone, tendon or muscle are NOT exposed.  Wound Description (Comments):   Present on Admission: Yes  Dressing Type Foam - Lift dressing to assess site every shift 01/31/23 1524     Pressure Injury 01/31/23 Buttocks Left Deep Tissue Pressure Injury - Purple or maroon localized area of discolored intact skin or blood-filled blister due to damage of underlying soft tissue from pressure and/or shear. Buttock Blanchable Purple; PT has s (Active)  01/31/23 1523  Location: Buttocks  Location Orientation: Left  Staging: Deep Tissue Pressure Injury - Purple or maroon localized area of discolored intact skin or blood-filled blister due to damage of underlying soft tissue from pressure and/or shear.  Wound Description (Comments): Buttock Blanchable Purple; PT has skin fold and on skin fold purple area non-blanching and open skin.  Present on Admission: Yes     Pressure Injury 01/31/23  Perineum Posterior Unstageable - Full thickness tissue loss in which the base of the injury is covered by slough (yellow, tan, gray, green or brown) and/or eschar (tan, brown or black) in the wound bed. Pressure Wound Posterior S (Active)  01/31/23 1523  Location: Perineum  Location Orientation: Posterior  Staging: Unstageable - Full thickness tissue loss in which the base of the injury is covered by slough (yellow, tan, gray, green or brown) and/or eschar (tan, brown or black) in the wound bed.  Wound Description (Comments): Pressure Wound Posterior Scrotum:  Open, slough  Present on Admission: Yes  Dressing Type -- (Off Weight, Scrotum in sling to prevent resting on back) 01/31/23 1524    DVT prophylaxis: eliquis Code Status:   Code Status: Full Code Family Communication: plan of care discussed with patient at bedside.  I had already patient's daughter yesterday. Patient status is: Inpatient because of respiratory failure heart failure and sepsis Level of care: Progressive   Dispo: The patient is from: Skilled nursing facility            Anticipated disposition: tbd Objective: Vitals last 24 hrs: Vitals:   02/01/23 0000 02/01/23 0018 02/01/23 0350 02/01/23 0756  BP:  (!) 98/56 (!) 109/94 113/64  Pulse: 62 (!) 53 85 65  Resp: 20 16 20  (!) 23  Temp:  97.9 F (36.6 C)  97.8 F (36.6 C)  TempSrc:  Oral  Oral  SpO2: 93% 92% 90% 94%  Weight:   113.1 kg   Height:       Weight change: 59.5 kg  Physical Examination: General exam: alert awake, older than stated age HEENT:Oral mucosa moist, Ear/Nose WNL grossly Respiratory system: bilaterally clear BS, no use of accessory muscle Cardiovascular system: S1 & S2 +, No JVD. Gastrointestinal system: Abdomen soft,NT,ND, BS+ Nervous System:Alert, awake, moving extremities. Extremities: LE edema w/ chronic hyperpigmentation and swelling Skin: No rashes,no icterus. MSK: Normal muscle bulk,tone, power  Medications reviewed:  Scheduled  Meds:  acetaminophen  1,000 mg Oral TID   allopurinol  100 mg Oral Daily   apixaban  5 mg Oral BID   atorvastatin  20 mg Oral Daily   Chlorhexidine Gluconate Cloth  6 each Topical Daily   cholecalciferol  2,000 Units Oral Daily   [START ON 02/14/2023] cyanocobalamin  1,000 mcg Intramuscular Q30 days   fluconazole  100 mg Oral Daily   furosemide  40 mg Intravenous Q12H   insulin aspart  0-5 Units Subcutaneous QHS   insulin aspart  0-9 Units Subcutaneous TID WC   lidocaine  2 patch Transdermal Daily   metoprolol tartrate  25 mg Oral BID   saccharomyces boulardii  250 mg Oral BID   tamsulosin  0.4 mg Oral QPC supper  Continuous Infusions:  ceFEPime (MAXIPIME) IV Stopped (01/31/23 2130)   vancomycin Stopped (01/31/23 2300)    Diet Order             Diet Carb Modified Fluid consistency: Thin; Room service appropriate? Yes  Diet effective now                  Intake/Output Summary (Last 24 hours) at 02/01/2023 1610 Last data filed at 02/01/2023 0802 Gross per 24 hour  Intake 1208.86 ml  Output 4150 ml  Net -2941.14 ml   Net IO Since Admission: -791.14 mL [02/01/23 0822]  Wt Readings from Last 3 Encounters:  02/01/23 113.1 kg  01/19/23 116.1 kg  11/18/22 124.7 kg     Unresulted Labs (From admission, onward)     Start     Ordered   01/30/23 2007  Urine Culture  Once,   R        01/30/23 2007          Data Reviewed: I have personally reviewed following labs and imaging studies CBC: Recent Labs  Lab 01/30/23 2004 01/30/23 2140 01/31/23 0000 01/31/23 1235 01/31/23 1806  WBC 9.2  --   --   --  6.6  NEUTROABS 6.9  --   --   --   --   HGB 10.3* 10.5* 9.9* 11.2* 10.1*  HCT 33.7* 31.0* 29.0* 33.0* 33.0*  MCV 96.3  --   --   --  94.8  PLT 273  --   --   --  281   Basic Metabolic Panel: Recent Labs  Lab 01/30/23 2004 01/30/23 2140 01/31/23 0000 01/31/23 1217 01/31/23 1235 01/31/23 1806 02/01/23 0046  NA 143   < > 143 141 143 142 140  K 3.7   < > 3.6 4.2  4.3 4.4 3.6  CL 103  --   --  104  --  100 103  CO2 31  --   --  31  --  30 30  GLUCOSE 141*  --   --  196*  --  227* 197*  BUN 18  --   --  21  --  25* 29*  CREATININE 2.03*  --   --  1.97*  --  1.92* 2.12*  CALCIUM 9.0  --   --  8.8*  --  8.9 8.6*   < > = values in this interval not displayed.   GFR: Estimated Creatinine Clearance: 35.6 mL/min (A) (by C-G formula based on SCr of 2.12 mg/dL (H)). Liver Function Tests: Recent Labs  Lab 01/30/23 2004  AST 19  ALT 22  ALKPHOS 49  BILITOT 0.8  PROT 5.6*  ALBUMIN <1.5*   No results for input(s): "LIPASE", "AMYLASE" in the last 168 hours. Recent Labs  Lab 01/30/23 2004  AMMONIA 16   Recent Labs  Lab 01/30/23 2004 01/30/23 2220  LATICACIDVEN 1.4 1.3    Recent Results (from the past 240 hour(s))  Blood Culture (routine x 2)     Status: None (Preliminary result)   Collection Time: 01/30/23  8:04 PM   Specimen: BLOOD RIGHT ARM  Result Value Ref Range Status   Specimen Description BLOOD RIGHT ARM  Final   Special Requests   Final    BOTTLES DRAWN AEROBIC AND ANAEROBIC Blood Culture results may not be optimal due to an excessive volume of blood received in culture bottles   Culture   Final    NO GROWTH < 12 HOURS Performed at [facility]  [facility]ospital Lab, [address], [place], [place] [address]    Report Status PENDING  Incomplete  Resp panel by RT-PCR (RSV, Flu A&B, Covid) Anterior Nasal Swab     Status: None   Collection Time: 01/30/23  8:10 PM   Specimen: Anterior Nasal Swab  Result Value Ref Range Status   SARS Coronavirus 2 by RT PCR NEGATIVE NEGATIVE Final   Influenza A by PCR NEGATIVE NEGATIVE Final   Influenza B by PCR NEGATIVE NEGATIVE Final    Comment: (NOTE) The Xpert Xpress SARS-CoV-2/FLU/RSV plus assay is intended as an aid in the diagnosis of influenza from Nasopharyngeal swab specimens and should not be used as a sole basis for treatment. Nasal washings and aspirates are unacceptable for Xpert Xpress  SARS-CoV-2/FLU/RSV testing.  Fact Sheet for Patients: BloggerCourse.com  Fact Sheet for Healthcare Providers: SeriousBroker.it  This test is not yet approved or cleared by the [place] FDA and has been authorized for detection and/or diagnosis of SARS-CoV-2 by FDA under an Emergency Use Authorization (EUA). This EUA will remain in effect (meaning this test can be used) for the duration of the COVID-19 declaration under Section 564(b)(1) of the Act, 21 U.S.C. section 360bbb-3(b)(1), unless the authorization is terminated or revoked.     Resp Syncytial Virus by PCR NEGATIVE NEGATIVE Final    Comment: (NOTE) Fact Sheet for Patients: BloggerCourse.com  Fact Sheet for Healthcare Providers: SeriousBroker.it  This test is not yet approved or cleared by the [place] FDA and has been authorized for detection and/or diagnosis of SARS-CoV-2 by FDA under an Emergency Use Authorization (EUA). This EUA will remain in effect (meaning this test can be used) for the duration of the COVID-19 declaration under Section 564(b)(1) of the Act, 21 U.S.C. section 360bbb-3(b)(1), unless the authorization is terminated or revoked.  Performed at [facility], [address], [place], [place] [address]   Blood Culture (routine x 2)     Status: None (Preliminary result)   Collection Time: 01/30/23  8:23 PM   Specimen: BLOOD LEFT ARM  Result Value Ref Range Status   Specimen Description BLOOD LEFT ARM  Final   Special Requests   Final    BOTTLES DRAWN AEROBIC AND ANAEROBIC Blood Culture adequate volume   Culture   Final    NO GROWTH < 12 HOURS Performed at [facility], [address] [place], [place] [address]    Report Status PENDING  Incomplete    Antimicrobials: Anti-infectives (From admission, onward)    Start     Dose/Rate Route Frequency Ordered Stop   01/31/23 2000   vancomycin (VANCOCIN) IVPB 1000 mg/200 mL premix  Status:  Discontinued        1,000 mg 200 mL/hr over 60 Minutes Intravenous Every 24 hours 01/30/23 2056 01/31/23 1407   01/31/23 2000  vancomycin (VANCOREADY) IVPB 750 mg/150 mL        750 mg 150 mL/hr over 60 Minutes Intravenous Every 24 hours 01/31/23 1407 02/06/23 1959   01/31/23 1530  fluconazole (DIFLUCAN) tablet 100 mg        100 mg Oral Daily 01/31/23 1432 02/05/23 0959   01/31/23 0800  ceFEPIme (MAXIPIME) 2 g in sodium chloride 0.9 % 100 mL IVPB        2 g 200 mL/hr over 30 Minutes Intravenous Every 12 hours 01/30/23 2056 02/06/23 1959   01/30/23 2015  ceFEPIme (MAXIPIME) 2 g in sodium chloride 0.9 % 100 mL IVPB  2 g 200 mL/hr over 30 Minutes Intravenous  Once 01/30/23 2007 01/30/23 2059   01/30/23 2015  metroNIDAZOLE (FLAGYL) IVPB 500 mg        500 mg 100 mL/hr over 60 Minutes Intravenous  Once 01/30/23 2007 01/30/23 2123   01/30/23 2015  vancomycin (VANCOCIN) IVPB 1000 mg/200 mL premix        1,000 mg 200 mL/hr over 60 Minutes Intravenous  Once 01/30/23 2007 01/30/23 2123      Culture/Microbiology    Component Value Date/Time   SDES BLOOD LEFT ARM 01/30/2023 2023   SPECREQUEST  01/30/2023 2023    BOTTLES DRAWN AEROBIC AND ANAEROBIC Blood Culture adequate volume   CULT  01/30/2023 2023    NO GROWTH < 12 HOURS Performed at [facility], [address], [place], [place] [address]    REPTSTATUS PENDING 01/30/2023 2023  Other culture-see note  Radiology Studies: US Renal  Result Date: 01/30/2023 CLINICAL DATA:  aki EXAM: RENAL / URINARY TRACT ULTRASOUND COMPLETE COMPARISON:  CT abdomen pelvis 01/12/2024 FINDINGS: Right Kidney: Renal measurements: 10.4 x 5 x 4.7 cm = volume: 127 mL. Renal cortical scarring. Echogenicity increased. No mass or hydronephrosis visualized. Left Kidney: Renal measurements: 13.5 x 6.2 x 7 cm = volume: 306 mL. Echogenicity increased. Multiple simple renal cysts as well as a single  minimally complex thinly septated cyst noted-no further follow-up indicated. No solid mass or hydronephrosis visualized. Urinary bladder: Appears normal for degree of bladder distention. Other: None. IMPRESSION: Increased renal echogenicity suggestive of renal parenchymal disease. Electronically Signed   By: Tish Frederickson M.D.   On: 01/30/2023 23:32   CT Head Wo Contrast  Result Date: 01/30/2023 CLINICAL DATA:  Mental status change, unknown cause EXAM: CT HEAD WITHOUT CONTRAST TECHNIQUE: Contiguous axial images were obtained from the base of the skull through the vertex without intravenous contrast. RADIATION DOSE REDUCTION: This exam was performed according to the departmental dose-optimization program which includes automated exposure control, adjustment of the mA and/or kV according to patient size and/or use of iterative reconstruction technique. COMPARISON:  CT head 01/12/2023 FINDINGS: Brain: Cerebral ventricle sizes are concordant with the degree of cerebral volume loss. Patchy and confluent areas of decreased attenuation are noted throughout the deep and periventricular white matter of the cerebral hemispheres bilaterally, compatible with chronic microvascular ischemic disease. No evidence of large-territorial acute infarction. No parenchymal hemorrhage. No mass lesion. No extra-axial collection. No mass effect or midline shift. No hydrocephalus. Basilar cisterns are patent. Vascular: No hyperdense vessel. Skull: No acute fracture or focal lesion. Sinuses/Orbits: Paranasal sinuses and mastoid air cells are clear. Bilateral lens replacement. Otherwise the orbits are unremarkable. Other: None. IMPRESSION: No acute intracranial abnormality. Electronically Signed   By: Tish Frederickson M.D.   On: 01/30/2023 22:07   DG Chest Port 1 View  Result Date: 01/30/2023 CLINICAL DATA:  Sepsis EXAM: PORTABLE CHEST 1 VIEW COMPARISON:  01/14/2023 FINDINGS: Single frontal view of the chest demonstrates an enlarged  cardiac silhouette. There is central vascular congestion, with patchy perihilar airspace disease and small bilateral pleural effusions. No pneumothorax. No acute bony abnormalities. IMPRESSION: 1. Constellation of findings most consistent with congestive heart failure. Electronically Signed   By: Sharlet Salina M.D.   On: 01/30/2023 20:31     LOS: 1 day   Lanae Boast, MD [facility]  02/01/2023, 8:22 AM

## 2023-02-01 NOTE — Inpatient Diabetes Management (Signed)
Inpatient Diabetes Program Recommendations  AACE/ADA: New Consensus Statement on Inpatient Glycemic Control (2015)  Target Ranges:  Prepandial:   less than 140 mg/dL      Peak postprandial:   less than 180 mg/dL (1-2 hours)      Critically ill patients:  140 - 180 mg/dL   Lab Results  Component Value Date   GLUCAP 172 (H) 02/01/2023   HGBA1C 7.0 (H) 10/08/2022    Review of Glycemic Control  Latest Reference Range & Units 01/31/23 07:32 01/31/23 12:15 01/31/23 16:36 01/31/23 21:29 02/01/23 06:03  Glucose-Capillary 70 - 99 mg/dL 161 (H) 096 (H) 045 (H) 259 (H) 172 (H)   Diabetes history: DM 2 Outpatient Diabetes medications:  Glucotrol XL 10 mg daily Current orders for Inpatient glycemic control:  Novolog 0-9 units tid with meals and HS  Inpatient Diabetes Program Recommendations:    May consider increasing Novolog to moderate tid with meals and HS.   Thanks,  Beryl Meager, RN, BC-ADM Inpatient Diabetes Coordinator Pager [phone]  (8a-5p)

## 2023-02-01 NOTE — Progress Notes (Signed)
Daily Progress Note   Patient Name: [name] [name]       Date: 02/01/2023 DOB: 12/15/1945  Age: 77 y.o. MRN#: [id number] Attending Physician: Lanae Boast, MD Primary Care Physician: Corwin Levins, MD Admit Date: 01/30/2023  Reason for Consultation/Follow-up: Establishing goals of care  Subjective: Medical records reviewed including progress notes, labs, imaging. Patient assessed at the bedside.  He is wearing BiPAP, awakens to voice and is able to hold a short conversation.  His son is present visiting.  Discussed with RN.  Created space and opportunity for family's thoughts and feelings on patient's current illness.  Patient's son is feeling more discouraged today seeing patient like this after he was so alert yesterday.  He has had further discussions with his sister last night about patient's CODE STATUS.  They have decided they would like 1 attempt at cardiopulmonary resuscitation, as long as this would potentially bring patient back to an acceptable quality of life.  They do not want him to undergo mechanical ventilation.    I recommended that family continues to consider DNR, given that often cardiopulmonary resuscitation and ACLS involves intubation rather than just 1 or the other.  Counseled that patient could also be DNR while his quality of life is currently poor, with the option of reverting to a full code if this were to improve in the future.  Patient's son verbalizes understanding and states that he would still want 1 honest attempt to resuscitate him at this time.  If patient were to go into respiratory arrest prior to cardiac arrest, then family does not want to escalate care past BiPAP use.  They would be open to discussing transition to DNR and comfort care at that time.  Given patient's  pauses in his heart rate, they are anticipating that he may have cardiac issues before he has respiratory issues.  We also briefly discussed difficulty with caring for the patient into the future if he continues to be weak and debilitated.  Reviewed options including long-term care placement and even hospice facility if patient continues to decline.  They have had good experience with hospice in the past when her mother died from cancer.  They are still hopeful for patient's improvement at this time, while understanding that he is at high risk for negative hospice sooner rather than later.  Questions and concerns addressed. PMT will continue to support holistically.   Length of Stay: 1   Physical Exam Vitals and nursing note reviewed.  Constitutional:      General: He is not in acute distress.    Appearance: He is ill-appearing.     Comments: BiPAP in place  Cardiovascular:     Rate and Rhythm: Normal rate.  Pulmonary:     Effort: Pulmonary effort is normal. No respiratory distress.  Neurological:     Mental Status: He is easily aroused.             Vital Signs: BP 103/65 (BP Location: Left Arm)   Pulse 69   Temp 98.1 F (36.7 C) (Oral)   Resp 20   Ht 5\' 8"  (1.727 m)   Wt 113.1 kg   SpO2 93%   BMI 37.91 kg/m  SpO2: SpO2: 93 % O2 Device: O2 Device: Bi-PAP O2 Flow Rate: O2 Flow Rate (L/min): 5 L/min      Palliative Assessment/Data: 30 to 40%   Palliative Care Assessment & Plan   Patient Profile: 77 y.o. male  with past medical history of diastolic CHF, migraine, type 2 diabetes mellitus, hypertension, dyslipidemia, GERD, OSA and pulmonary hypertension admitted on 01/30/2023 with worsening dyspnea, orthopnea, PND, dyspnea on exertion, bilateral edema in lower extremities.    Patient was admitted for acute on chronic diastolic CHF, acute hypoxic and hypercapnic respiratory failure.  Patient has been admitted 3 times in the past 6 months, most recently less than 30 days ago.  PMT has been consulted to assist with goals of care conversation.  Assessment: Goals of care conversation Acute on chronic respiratory failure with hypoxia and hypercarbia Acute metabolic encephalopathy Acute on chronic diastolic CHF Acute pulmonary edema AKI on CKD 3A Multiple pressure ulcers  Recommendations/Plan: Continue full code (family wants 1 attempt of CPR, NO intubation. Son understands partial code is not currently an option) NO mechanical ventilation if respiratory status further deteriorates, full scope treatment otherwise Ongoing goals of care discussions Psychosocial and emotional support provided Spiritual care consult for patient PMT will continue to follow and support   Prognosis: Guarded to poor  Discharge Planning: To Be Determined  Care plan was discussed with patient, patient's son, MD, RN   MDM high         Clara Smolen Jeni Salles, PA-C  Palliative Medicine Team Team phone # [phone]  Thank you for allowing the Palliative Medicine Team to assist in the care of this patient. Please utilize secure chat with additional questions, if there is no response within 30 minutes please call the above phone number.  Palliative Medicine Team providers are available by phone from 7am to 7pm daily and can be reached through the team cell phone.  Should this patient require assistance outside of these hours, please call the patient's attending physician.

## 2023-02-01 NOTE — Progress Notes (Signed)
   02/01/23 2256  BiPAP/CPAP/SIPAP  $ Non-Invasive Home Ventilator  Subsequent  BiPAP/CPAP/SIPAP Pt Type Adult  BiPAP/CPAP/SIPAP Resmed  Mask Type Full face mask  Mask Size Medium  Flow Rate 7 lpm  Patient Home Equipment No  Auto Titrate Yes  BiPAP/CPAP /SiPAP Vitals  Pulse Rate 77  Resp 20  SpO2 90 %  Bilateral Breath Sounds Clear;Diminished  MEWS Score/Color  MEWS Score 0  MEWS Score Color [name]

## 2023-02-01 NOTE — Progress Notes (Signed)
At 11:43 PT had a 2.5 second pause. Currently brady in the 64s. Had a couple of other 2 second pauses around the same time. PT found to be asleep when RN in room, Bipap put on. Dr. Jonathon Bellows and PA Robet Leu notified by secure chat.

## 2023-02-01 NOTE — Progress Notes (Signed)
DAILY PROGRESS NOTE   Patient Name: [name] [name] Date of Encounter: 02/01/2023 Cardiologist: Olga Millers, MD  Chief Complaint   Breathing better  Patient Profile   [name] [name] is a 77 y.o. male with a hx of permanent atrial fibrillation, R sided heart failure secondary felt secondary to pulmonary HTN (OSA, OHS, possible pulmonary venous HTN), migraines, type 2 DM, HTN, HLD, GERD who is being seen 01/31/2023 for the evaluation of CHF at the request of Dr. Jonathon Bellows   Subjective   Less swelling, breathing has improved - noted to be about 1.9L negative yesterday and another 1L negative already today. Creatinine did bump yesterday with diuresis from 1.92 to 2.12. On IV lasix 40 mg BID.  I would note that his albumin is seriously low at <1.5, so likely no oncotic pressure for diuresis.  Objective   Vitals:   02/01/23 0018 02/01/23 0350 02/01/23 0756 02/01/23 0828  BP: (!) 98/56 (!) 109/94 113/64 107/76  Pulse: (!) 53 85 65 66  Resp: 16 20 (!) 23   Temp: 97.9 F (36.6 C)  97.8 F (36.6 C)   TempSrc: Oral  Oral   SpO2: 92% 90% 94%   Weight:  113.1 kg    Height:        Intake/Output Summary (Last 24 hours) at 02/01/2023 0850 Last data filed at 02/01/2023 3016 Gross per 24 hour  Intake 1208.86 ml  Output 4150 ml  Net -2941.14 ml   Filed Weights   01/30/23 2012 01/31/23 1300 02/01/23 0350  Weight: 99.8 kg 112.1 kg 113.1 kg    Physical Exam   General appearance: alert, no distress, morbidly obese, pale, and anasarcic Neck: JVD - several cm above sternal notch, no carotid bruit, and thyroid not enlarged, symmetric, no tenderness/mass/nodules Lungs: diminished breath sounds bibasilar Heart: regular rate and rhythm Abdomen: obese Extremities: edema 3-4+bilateral UE/LE edema Pulses: 2+ and symmetric Skin: Skin color, texture, turgor normal. No rashes or lesions Neurologic: Grossly normal Psych: Pleasant  Inpatient Medications    Scheduled Meds:  acetaminophen  1,000 mg Oral  TID   allopurinol  100 mg Oral Daily   apixaban  5 mg Oral BID   atorvastatin  20 mg Oral Daily   Chlorhexidine Gluconate Cloth  6 each Topical Daily   cholecalciferol  2,000 Units Oral Daily   [START ON 02/14/2023] cyanocobalamin  1,000 mcg Intramuscular Q30 days   fluconazole  100 mg Oral Daily   furosemide  40 mg Intravenous Q12H   insulin aspart  0-5 Units Subcutaneous QHS   insulin aspart  0-9 Units Subcutaneous TID WC   lidocaine  2 patch Transdermal Daily   metoprolol tartrate  25 mg Oral BID   saccharomyces boulardii  250 mg Oral BID   tamsulosin  0.4 mg Oral QPC supper    Continuous Infusions:  ceFEPime (MAXIPIME) IV Stopped (01/31/23 2130)    PRN Meds: magnesium hydroxide, ondansetron **OR** ondansetron (ZOFRAN) IV   Labs   Results for orders placed or performed during the hospital encounter of 01/30/23 (from the past 48 hour(s))  Lactic acid, plasma     Status: None   Collection Time: 01/30/23  8:04 PM  Result Value Ref Range   Lactic Acid, Venous 1.4 0.5 - 1.9 mmol/L    Comment: Performed at [facility], [address], [place], [place] [address]  Comprehensive metabolic panel     Status: Abnormal   Collection Time: 01/30/23  8:04 PM  Result Value  Ref Range   Sodium 143 135 - 145 mmol/L   Potassium 3.7 3.5 - 5.1 mmol/L   Chloride 103 98 - 111 mmol/L   CO2 31 22 - 32 mmol/L   Glucose, Bld 141 (H) 70 - 99 mg/dL    Comment: Glucose reference range applies only to samples taken after fasting for at least 8 hours.   BUN 18 8 - 23 mg/dL   Creatinine, Ser 7.82 (H) 0.61 - 1.24 mg/dL   Calcium 9.0 8.9 - 95.6 mg/dL   Total Protein 5.6 (L) 6.5 - 8.1 g/dL   Albumin <2.1 (L) 3.5 - 5.0 g/dL   AST 19 15 - 41 U/L   ALT 22 0 - 44 U/L   Alkaline Phosphatase 49 38 - 126 U/L   Total Bilirubin 0.8 0.3 - 1.2 mg/dL   GFR, Estimated 33 (L) >60 mL/min    Comment: (NOTE) Calculated using the CKD-EPI Creatinine Equation (2021)    Anion gap 9 5 - 15    Comment: Performed  at [facility], [address] [place], [place] [address]  CBC with Differential     Status: Abnormal   Collection Time: 01/30/23  8:04 PM  Result Value Ref Range   WBC 9.2 4.0 - 10.5 K/uL   RBC 3.50 (L) 4.22 - 5.81 MIL/uL   Hemoglobin 10.3 (L) 13.0 - 17.0 g/dL   HCT 78.4 (L) 69.6 - 29.5 %   MCV 96.3 80.0 - 100.0 fL   MCH 29.4 26.0 - 34.0 pg   MCHC 30.6 30.0 - 36.0 g/dL   RDW 28.4 (H) 13.2 - 44.0 %   Platelets 273 150 - 400 K/uL   nRBC 0.0 0.0 - 0.2 %   Neutrophils Relative % 75 %   Neutro Abs 6.9 1.7 - 7.7 K/uL   Lymphocytes Relative 17 %   Lymphs Abs 1.6 0.7 - 4.0 K/uL   Monocytes Relative 6 %   Monocytes Absolute 0.5 0.1 - 1.0 K/uL   Eosinophils Relative 1 %   Eosinophils Absolute 0.1 0.0 - 0.5 K/uL   Basophils Relative 0 %   Basophils Absolute 0.0 0.0 - 0.1 K/uL   Immature Granulocytes 1 %   Abs Immature Granulocytes 0.05 0.00 - 0.07 K/uL    Comment: Performed at [facility], [address], [place], [place] [address]  Protime-INR     Status: Abnormal   Collection Time: 01/30/23  8:04 PM  Result Value Ref Range   Prothrombin Time 21.8 (H) 11.4 - 15.2 seconds   INR 1.9 (H) 0.8 - 1.2    Comment: (NOTE) INR goal varies based on device and disease states. Performed at [facility], [address] [place], [place] [address]   APTT     Status: Abnormal   Collection Time: 01/30/23  8:04 PM  Result Value Ref Range   aPTT 40 (H) 24 - 36 seconds    Comment:        IF BASELINE aPTT IS ELEVATED, SUGGEST PATIENT RISK ASSESSMENT BE USED TO DETERMINE APPROPRIATE ANTICOAGULANT THERAPY. Performed at [facility], [address], [place], [place] [address]   Blood Culture (routine x 2)     Status: None (Preliminary result)   Collection Time: 01/30/23  8:04 PM   Specimen: BLOOD RIGHT ARM  Result Value Ref Range   Specimen Description BLOOD RIGHT ARM    Special Requests      BOTTLES DRAWN AEROBIC AND ANAEROBIC Blood Culture  results may not be optimal  due to an excessive volume of blood received in culture bottles   Culture      NO GROWTH < 12 HOURS Performed at [facility], [address], [place], [place] [address]    Report Status PENDING   Ammonia     Status: None   Collection Time: 01/30/23  8:04 PM  Result Value Ref Range   Ammonia 16 9 - 35 umol/L    Comment: Performed at [facility], [address], [place], [place] [address]  Urinalysis, w/ Reflex to Culture (Infection Suspected) -Urine, Catheterized     Status: Abnormal   Collection Time: 01/30/23  8:07 PM  Result Value Ref Range   Specimen Source URINE, CATHETERIZED    Color, Urine RED (A) YELLOW    Comment: BIOCHEMICALS MAY BE AFFECTED BY COLOR   APPearance HAZY (A) CLEAR   Specific Gravity, Urine 1.008 1.005 - 1.030   pH 6.0 5.0 - 8.0   Glucose, UA 50 (A) NEGATIVE mg/dL   Hgb urine dipstick MODERATE (A) NEGATIVE   Bilirubin Urine NEGATIVE NEGATIVE   Ketones, ur NEGATIVE NEGATIVE mg/dL   Protein, ur 098 (A) NEGATIVE mg/dL   Nitrite NEGATIVE NEGATIVE   Leukocytes,Ua MODERATE (A) NEGATIVE   RBC / HPF >50 0 - 5 RBC/hpf   WBC, UA >50 0 - 5 WBC/hpf    Comment:        Reflex urine culture not performed if WBC <=10, OR if Squamous epithelial cells >5. If Squamous epithelial cells >5 suggest recollection.    Bacteria, UA FEW (A) NONE SEEN   Squamous Epithelial / HPF 0-5 0 - 5 /HPF   Mucus PRESENT    Hyaline Casts, UA PRESENT     Comment: Performed at [facility], [address] [place], [place] [address]  Resp panel by RT-PCR (RSV, Flu A&B, Covid) Anterior Nasal Swab     Status: None   Collection Time: 01/30/23  8:10 PM   Specimen: Anterior Nasal Swab  Result Value Ref Range   SARS Coronavirus 2 by RT PCR NEGATIVE NEGATIVE   Influenza A by PCR NEGATIVE NEGATIVE   Influenza B by PCR NEGATIVE NEGATIVE    Comment: (NOTE) The Xpert Xpress SARS-CoV-2/FLU/RSV plus assay is intended as an aid in the diagnosis of influenza from Nasopharyngeal swab  specimens and should not be used as a sole basis for treatment. Nasal washings and aspirates are unacceptable for Xpert Xpress SARS-CoV-2/FLU/RSV testing.  Fact Sheet for Patients: BloggerCourse.com  Fact Sheet for Healthcare Providers: SeriousBroker.it  This test is not yet approved or cleared by the [place] FDA and has been authorized for detection and/or diagnosis of SARS-CoV-2 by FDA under an Emergency Use Authorization (EUA). This EUA will remain in effect (meaning this test can be used) for the duration of the COVID-19 declaration under Section 564(b)(1) of the Act, 21 U.S.C. section 360bbb-3(b)(1), unless the authorization is terminated or revoked.     Resp Syncytial Virus by PCR NEGATIVE NEGATIVE    Comment: (NOTE) Fact Sheet for Patients: BloggerCourse.com  Fact Sheet for Healthcare Providers: SeriousBroker.it  This test is not yet approved or cleared by the [place] FDA and has been authorized for detection and/or diagnosis of SARS-CoV-2 by FDA under an Emergency Use Authorization (EUA). This EUA will remain in effect (meaning this test can be used) for the duration of the COVID-19 declaration under Section 564(b)(1) of the Act, 21 U.S.C. section 360bbb-3(b)(1),  unless the authorization is terminated or revoked.  Performed at [facility], [address], [place], [place] [address]   Blood Culture (routine x 2)     Status: None (Preliminary result)   Collection Time: 01/30/23  8:23 PM   Specimen: BLOOD LEFT ARM  Result Value Ref Range   Specimen Description BLOOD LEFT ARM    Special Requests      BOTTLES DRAWN AEROBIC AND ANAEROBIC Blood Culture adequate volume   Culture      NO GROWTH < 12 HOURS Performed at [facility], [address], [place], [place] [address]    Report Status PENDING   I-Stat arterial blood gas, ED [facility] ED, [facility],  [facility])     Status: Abnormal   Collection Time: 01/30/23  9:40 PM  Result Value Ref Range   pH, Arterial 7.273 (L) 7.35 - 7.45   pCO2 arterial 71.7 (HH) 32 - 48 mmHg   pO2, Arterial 75 (L) 83 - 108 mmHg   Bicarbonate 33.1 (H) 20.0 - 28.0 mmol/L   TCO2 35 (H) 22 - 32 mmol/L   O2 Saturation 92 %   Acid-Base Excess 5.0 (H) 0.0 - 2.0 mmol/L   Sodium 143 135 - 145 mmol/L   Potassium 3.6 3.5 - 5.1 mmol/L   Calcium, Ion 1.36 1.15 - 1.40 mmol/L   HCT 31.0 (L) 39.0 - 52.0 %   Hemoglobin 10.5 (L) 13.0 - 17.0 g/dL   Patient temperature 13.0 F    Collection site RADIAL, ALLEN'S TEST ACCEPTABLE    Drawn by Operator    Sample type ARTERIAL    Comment NOTIFIED PHYSICIAN   Lactic acid, plasma     Status: None   Collection Time: 01/30/23 10:20 PM  Result Value Ref Range   Lactic Acid, Venous 1.3 0.5 - 1.9 mmol/L    Comment: Performed at [facility], [address], [place], [place] [address]  Brain natriuretic peptide     Status: Abnormal   Collection Time: 01/30/23 10:20 PM  Result Value Ref Range   B Natriuretic Peptide 230.3 (H) 0.0 - 100.0 pg/mL    Comment: Performed at [facility], [address], [place], [place] [address]  Troponin I (High Sensitivity)     Status: None   Collection Time: 01/30/23 10:20 PM  Result Value Ref Range   Troponin I (High Sensitivity) 7 <18 ng/L    Comment: (NOTE) Elevated high sensitivity troponin I (hsTnI) values and significant  changes across serial measurements may suggest ACS but many other  chronic and acute conditions are known to elevate hsTnI results.  Refer to the "Links" section for chest pain algorithms and additional  guidance. Performed at [facility], [address] [place], [place] [address]   Troponin I (High Sensitivity)     Status: None   Collection Time: 01/30/23 11:55 PM  Result Value Ref Range   Troponin I (High Sensitivity) 5 <18 ng/L    Comment: (NOTE) Elevated high sensitivity troponin I (hsTnI) values and  significant  changes across serial measurements may suggest ACS but many other  chronic and acute conditions are known to elevate hsTnI results.  Refer to the "Links" section for chest pain algorithms and additional  guidance. Performed at [facility], [address], [place], [place] [address]   I-Stat venous blood gas, [facility] ED, [facility], [facility])     Status: Abnormal   Collection Time: 01/31/23 12:00 AM  Result  Value Ref Range   pH, Ven 7.381 7.25 - 7.43   pCO2, Ven 53.7 44 - 60 mmHg   pO2, Ven 52 (H) 32 - 45 mmHg   Bicarbonate 31.8 (H) 20.0 - 28.0 mmol/L   TCO2 33 (H) 22 - 32 mmol/L   O2 Saturation 85 %   Acid-Base Excess 6.0 (H) 0.0 - 2.0 mmol/L   Sodium 143 135 - 145 mmol/L   Potassium 3.6 3.5 - 5.1 mmol/L   Calcium, Ion 1.26 1.15 - 1.40 mmol/L   HCT 29.0 (L) 39.0 - 52.0 %   Hemoglobin 9.9 (L) 13.0 - 17.0 g/dL   Sample type VENOUS   CBG monitoring, ED     Status: Abnormal   Collection Time: 01/31/23  7:32 AM  Result Value Ref Range   Glucose-Capillary 153 (H) 70 - 99 mg/dL    Comment: Glucose reference range applies only to samples taken after fasting for at least 8 hours.  CBG monitoring, ED     Status: Abnormal   Collection Time: 01/31/23 12:15 PM  Result Value Ref Range   Glucose-Capillary 160 (H) 70 - 99 mg/dL    Comment: Glucose reference range applies only to samples taken after fasting for at least 8 hours.  Basic metabolic panel     Status: Abnormal   Collection Time: 01/31/23 12:17 PM  Result Value Ref Range   Sodium 141 135 - 145 mmol/L   Potassium 4.2 3.5 - 5.1 mmol/L   Chloride 104 98 - 111 mmol/L   CO2 31 22 - 32 mmol/L   Glucose, Bld 196 (H) 70 - 99 mg/dL    Comment: Glucose reference range applies only to samples taken after fasting for at least 8 hours.   BUN 21 8 - 23 mg/dL   Creatinine, Ser 1.61 (H) 0.61 - 1.24 mg/dL   Calcium 8.8 (L) 8.9 - 10.3 mg/dL   GFR, Estimated 34 (L) >60 mL/min    Comment: (NOTE) Calculated using the CKD-EPI Creatinine  Equation (2021)    Anion gap 6 5 - 15    Comment: Performed at [facility], [address] [place], [place] [address]  I-Stat venous blood gas, ED     Status: Abnormal   Collection Time: 01/31/23 12:35 PM  Result Value Ref Range   pH, Ven 7.308 7.25 - 7.43   pCO2, Ven 70.6 (HH) 44 - 60 mmHg   pO2, Ven 124 (H) 32 - 45 mmHg   Bicarbonate 35.4 (H) 20.0 - 28.0 mmol/L   TCO2 38 (H) 22 - 32 mmol/L   O2 Saturation 98 %   Acid-Base Excess 7.0 (H) 0.0 - 2.0 mmol/L   Sodium 143 135 - 145 mmol/L   Potassium 4.3 3.5 - 5.1 mmol/L   Calcium, Ion 1.24 1.15 - 1.40 mmol/L   HCT 33.0 (L) 39.0 - 52.0 %   Hemoglobin 11.2 (L) 13.0 - 17.0 g/dL   Sample type VENOUS    Comment NOTIFIED PHYSICIAN   Glucose, capillary     Status: Abnormal   Collection Time: 01/31/23  4:36 PM  Result Value Ref Range   Glucose-Capillary 213 (H) 70 - 99 mg/dL    Comment: Glucose reference range applies only to samples taken after fasting for at least 8 hours.  Basic metabolic panel     Status: Abnormal   Collection Time: 01/31/23  6:06 PM  Result Value Ref Range   Sodium 142 135 - 145 mmol/L   Potassium 4.4 3.5 - 5.1  mmol/L    Comment: HEMOLYSIS AT THIS LEVEL MAY AFFECT RESULT   Chloride 100 98 - 111 mmol/L   CO2 30 22 - 32 mmol/L   Glucose, Bld 227 (H) 70 - 99 mg/dL    Comment: Glucose reference range applies only to samples taken after fasting for at least 8 hours.   BUN 25 (H) 8 - 23 mg/dL   Creatinine, Ser 3.47 (H) 0.61 - 1.24 mg/dL   Calcium 8.9 8.9 - 42.5 mg/dL   GFR, Estimated 35 (L) >60 mL/min    Comment: (NOTE) Calculated using the CKD-EPI Creatinine Equation (2021)    Anion gap 12 5 - 15    Comment: Performed at [facility] Lab, [address] [place], [place] [address]  CBC     Status: Abnormal   Collection Time: 01/31/23  6:06 PM  Result Value Ref Range   WBC 6.6 4.0 - 10.5 K/uL   RBC 3.48 (L) 4.22 - 5.81 MIL/uL   Hemoglobin 10.1 (L) 13.0 - 17.0 g/dL   HCT 75.6 (L) 43.3 - 29.5 %   MCV  94.8 80.0 - 100.0 fL   MCH 29.0 26.0 - 34.0 pg   MCHC 30.6 30.0 - 36.0 g/dL   RDW 18.8 (H) 41.6 - 60.6 %   Platelets 281 150 - 400 K/uL   nRBC 0.0 0.0 - 0.2 %    Comment: Performed at [facility], [address] [place], [place] [address]  Glucose, capillary     Status: Abnormal   Collection Time: 01/31/23  9:29 PM  Result Value Ref Range   Glucose-Capillary 259 (H) 70 - 99 mg/dL    Comment: Glucose reference range applies only to samples taken after fasting for at least 8 hours.  Basic metabolic panel     Status: Abnormal   Collection Time: 02/01/23 12:46 AM  Result Value Ref Range   Sodium 140 135 - 145 mmol/L   Potassium 3.6 3.5 - 5.1 mmol/L   Chloride 103 98 - 111 mmol/L   CO2 30 22 - 32 mmol/L   Glucose, Bld 197 (H) 70 - 99 mg/dL    Comment: Glucose reference range applies only to samples taken after fasting for at least 8 hours.   BUN 29 (H) 8 - 23 mg/dL   Creatinine, Ser 1.09 (H) 0.61 - 1.24 mg/dL   Calcium 8.6 (L) 8.9 - 10.3 mg/dL   GFR, Estimated 31 (L) >60 mL/min    Comment: (NOTE) Calculated using the CKD-EPI Creatinine Equation (2021)    Anion gap 7 5 - 15    Comment: Performed at [facility], [address] [place], [place] [address]  Glucose, capillary     Status: Abnormal   Collection Time: 02/01/23  6:03 AM  Result Value Ref Range   Glucose-Capillary 172 (H) 70 - 99 mg/dL    Comment: Glucose reference range applies only to samples taken after fasting for at least 8 hours.    ECG   N/A - Personally Reviewed  Telemetry   Afib - Personally Reviewed  Radiology    US Renal  Result Date: 01/30/2023 CLINICAL DATA:  aki EXAM: RENAL / URINARY TRACT ULTRASOUND COMPLETE COMPARISON:  CT abdomen pelvis 01/12/2024 FINDINGS: Right Kidney: Renal measurements: 10.4 x 5 x 4.7 cm = volume: 127 mL. Renal cortical scarring. Echogenicity increased. No mass or hydronephrosis visualized. Left Kidney: Renal measurements: 13.5 x 6.2 x 7 cm = volume: 306 mL.  Echogenicity increased. Multiple simple  renal cysts as well as a single minimally complex thinly septated cyst noted-no further follow-up indicated. No solid mass or hydronephrosis visualized. Urinary bladder: Appears normal for degree of bladder distention. Other: None. IMPRESSION: Increased renal echogenicity suggestive of renal parenchymal disease. Electronically Signed   By: Tish Frederickson M.D.   On: 01/30/2023 23:32   CT Head Wo Contrast  Result Date: 01/30/2023 CLINICAL DATA:  Mental status change, unknown cause EXAM: CT HEAD WITHOUT CONTRAST TECHNIQUE: Contiguous axial images were obtained from the base of the skull through the vertex without intravenous contrast. RADIATION DOSE REDUCTION: This exam was performed according to the departmental dose-optimization program which includes automated exposure control, adjustment of the mA and/or kV according to patient size and/or use of iterative reconstruction technique. COMPARISON:  CT head 01/12/2023 FINDINGS: Brain: Cerebral ventricle sizes are concordant with the degree of cerebral volume loss. Patchy and confluent areas of decreased attenuation are noted throughout the deep and periventricular white matter of the cerebral hemispheres bilaterally, compatible with chronic microvascular ischemic disease. No evidence of large-territorial acute infarction. No parenchymal hemorrhage. No mass lesion. No extra-axial collection. No mass effect or midline shift. No hydrocephalus. Basilar cisterns are patent. Vascular: No hyperdense vessel. Skull: No acute fracture or focal lesion. Sinuses/Orbits: Paranasal sinuses and mastoid air cells are clear. Bilateral lens replacement. Otherwise the orbits are unremarkable. Other: None. IMPRESSION: No acute intracranial abnormality. Electronically Signed   By: Tish Frederickson M.D.   On: 01/30/2023 22:07   DG Chest Port 1 View  Result Date: 01/30/2023 CLINICAL DATA:  Sepsis EXAM: PORTABLE CHEST 1 VIEW COMPARISON:   01/14/2023 FINDINGS: Single frontal view of the chest demonstrates an enlarged cardiac silhouette. There is central vascular congestion, with patchy perihilar airspace disease and small bilateral pleural effusions. No pneumothorax. No acute bony abnormalities. IMPRESSION: 1. Constellation of findings most consistent with congestive heart failure. Electronically Signed   By: Sharlet Salina M.D.   On: 01/30/2023 20:31    Cardiac Studies   N/a  Assessment   Principal Problem:   Acute respiratory failure with hypoxia and hypercarbia (HCC) Active Problems:   Essential hypertension   OSA (obstructive sleep apnea)   Gout   Acute on chronic diastolic CHF (congestive heart failure) (HCC)   Dyslipidemia   Type 2 diabetes mellitus without complications (HCC)   BPH (benign prostatic hyperplasia)   Acute respiratory failure with hypoxia and hypercapnia (HCC)   Acute on chronic respiratory failure with hypoxia and hypercapnia (HCC)   Acute pulmonary edema (HCC)   Plan   Good diuresis, however, rising creatinine - remains anasarcic. Issue is severe malnutrition (albumin <1.5). Will give albumin prior to evening dose of lasix - encourage protein supplementation.  Time Spent Directly with Patient:  I have spent a total of 25 minutes with the patient reviewing hospital notes, telemetry, EKGs, labs and examining the patient as well as establishing an assessment and plan that was discussed personally with the patient.  > 50% of time was spent in direct patient care.  Length of Stay:  LOS: 1 day   Chrystie Nose, MD, [facility], FACP  [place]  [facility]  Medical Director of the [facility] Lipid [facility] &  Cardiovascular Risk Reduction Clinic Diplomate of the American Board of Clinical Lipidology Attending Cardiologist  Direct Dial: [phone]  Fax: [phone]  Website:  [email or web address] Nicely Kristoph Sattler 02/01/2023, 8:50 AM

## 2023-02-01 NOTE — Plan of Care (Signed)
Problem: Education: Goal: Ability to demonstrate management of disease process will improve 02/01/2023 1821 by Jeraldine Loots, RN Outcome: Progressing 02/01/2023 1820 by Jeraldine Loots, RN Outcome: Progressing Goal: Ability to verbalize understanding of medication therapies will improve 02/01/2023 1821 by Jeraldine Loots, RN Outcome: Progressing 02/01/2023 1820 by Jeraldine Loots, RN Outcome: Progressing Goal: Individualized Educational Video(s) 02/01/2023 1821 by Jeraldine Loots, RN Outcome: Progressing 02/01/2023 1820 by Jeraldine Loots, RN Outcome: Progressing   Problem: Activity: Goal: Capacity to carry out activities will improve 02/01/2023 1821 by Jeraldine Loots, RN Outcome: Progressing 02/01/2023 1820 by Jeraldine Loots, RN Outcome: Progressing   Problem: Cardiac: Goal: Ability to achieve and maintain adequate cardiopulmonary perfusion will improve 02/01/2023 1821 by Jeraldine Loots, RN Outcome: Progressing 02/01/2023 1820 by Jeraldine Loots, RN Outcome: Progressing   Problem: Education: Goal: Ability to describe self-care measures that may prevent or decrease complications (Diabetes Survival Skills Education) will improve 02/01/2023 1821 by Jeraldine Loots, RN Outcome: Progressing 02/01/2023 1820 by Jeraldine Loots, RN Outcome: Progressing Goal: Individualized Educational Video(s) 02/01/2023 1821 by Jeraldine Loots, RN Outcome: Progressing 02/01/2023 1820 by Jeraldine Loots, RN Outcome: Progressing   Problem: Coping: Goal: Ability to adjust to condition or change in health will improve 02/01/2023 1821 by Jeraldine Loots, RN Outcome: Progressing 02/01/2023 1820 by Jeraldine Loots, RN Outcome: Progressing   Problem: Fluid Volume: Goal: Ability to maintain a balanced intake and output will improve 02/01/2023 1821 by Jeraldine Loots, RN Outcome: Progressing 02/01/2023 1820 by Jeraldine Loots, RN Outcome: Progressing   Problem: Health Behavior/Discharge Planning: Goal: Ability to identify and utilize  available resources and services will improve 02/01/2023 1821 by Jeraldine Loots, RN Outcome: Progressing 02/01/2023 1820 by Jeraldine Loots, RN Outcome: Progressing Goal: Ability to manage health-related needs will improve 02/01/2023 1821 by Jeraldine Loots, RN Outcome: Progressing 02/01/2023 1820 by Jeraldine Loots, RN Outcome: Progressing   Problem: Metabolic: Goal: Ability to maintain appropriate glucose levels will improve 02/01/2023 1821 by Jeraldine Loots, RN Outcome: Progressing 02/01/2023 1820 by Jeraldine Loots, RN Outcome: Progressing   Problem: Nutritional: Goal: Maintenance of adequate nutrition will improve 02/01/2023 1821 by Jeraldine Loots, RN Outcome: Progressing 02/01/2023 1820 by Jeraldine Loots, RN Outcome: Progressing Goal: Progress toward achieving an optimal weight will improve 02/01/2023 1821 by Jeraldine Loots, RN Outcome: Progressing 02/01/2023 1820 by Jeraldine Loots, RN Outcome: Progressing   Problem: Skin Integrity: Goal: Risk for impaired skin integrity will decrease 02/01/2023 1821 by Jeraldine Loots, RN Outcome: Progressing 02/01/2023 1820 by Jeraldine Loots, RN Outcome: Progressing   Problem: Tissue Perfusion: Goal: Adequacy of tissue perfusion will improve 02/01/2023 1821 by Jeraldine Loots, RN Outcome: Progressing 02/01/2023 1820 by Jeraldine Loots, RN Outcome: Progressing   Problem: Education: Goal: Knowledge of General Education information will improve Description: Including pain rating scale, medication(s)/side effects and non-pharmacologic comfort measures 02/01/2023 1821 by Jeraldine Loots, RN Outcome: Progressing 02/01/2023 1820 by Jeraldine Loots, RN Outcome: Progressing   Problem: Health Behavior/Discharge Planning: Goal: Ability to manage health-related needs will improve 02/01/2023 1821 by Jeraldine Loots, RN Outcome: Progressing 02/01/2023 1820 by Jeraldine Loots, RN Outcome: Progressing   Problem: Clinical Measurements: Goal: Ability to maintain clinical  measurements within normal limits will improve 02/01/2023 1821 by Jeraldine Loots, RN Outcome: Progressing 02/01/2023 1820 by Jeraldine Loots, RN Outcome: Progressing Goal: Will remain free from infection 02/01/2023 1821  by Jeraldine Loots, RN Outcome: Progressing 02/01/2023 1820 by Jeraldine Loots, RN Outcome: Progressing Goal: Diagnostic test results will improve 02/01/2023 1821 by Jeraldine Loots, RN Outcome: Progressing 02/01/2023 1820 by Jeraldine Loots, RN Outcome: Progressing Goal: Respiratory complications will improve 02/01/2023 1821 by Jeraldine Loots, RN Outcome: Progressing 02/01/2023 1820 by Jeraldine Loots, RN Outcome: Progressing Goal: Cardiovascular complication will be avoided 02/01/2023 1821 by Jeraldine Loots, RN Outcome: Progressing 02/01/2023 1820 by Jeraldine Loots, RN Outcome: Progressing   Problem: Activity: Goal: Risk for activity intolerance will decrease 02/01/2023 1821 by Jeraldine Loots, RN Outcome: Progressing 02/01/2023 1820 by Jeraldine Loots, RN Outcome: Progressing   Problem: Nutrition: Goal: Adequate nutrition will be maintained 02/01/2023 1821 by Jeraldine Loots, RN Outcome: Progressing 02/01/2023 1820 by Jeraldine Loots, RN Outcome: Progressing   Problem: Coping: Goal: Level of anxiety will decrease 02/01/2023 1821 by Jeraldine Loots, RN Outcome: Progressing 02/01/2023 1820 by Jeraldine Loots, RN Outcome: Progressing   Problem: Elimination: Goal: Will not experience complications related to bowel motility 02/01/2023 1821 by Jeraldine Loots, RN Outcome: Progressing 02/01/2023 1820 by Jeraldine Loots, RN Outcome: Progressing Goal: Will not experience complications related to urinary retention 02/01/2023 1821 by Jeraldine Loots, RN Outcome: Progressing 02/01/2023 1820 by Jeraldine Loots, RN Outcome: Progressing   Problem: Pain Managment: Goal: General experience of comfort will improve 02/01/2023 1821 by Jeraldine Loots, RN Outcome: Progressing 02/01/2023 1820 by Jeraldine Loots,  RN Outcome: Progressing   Problem: Safety: Goal: Ability to remain free from injury will improve 02/01/2023 1821 by Jeraldine Loots, RN Outcome: Progressing 02/01/2023 1820 by Jeraldine Loots, RN Outcome: Progressing   Problem: Skin Integrity: Goal: Risk for impaired skin integrity will decrease 02/01/2023 1821 by Jeraldine Loots, RN Outcome: Progressing 02/01/2023 1820 by Jeraldine Loots, RN Outcome: Progressing

## 2023-02-01 NOTE — Progress Notes (Signed)
Pt uncomfortable throughout night. Scheduled tylenol not providing relief. Chronic back pain. Repositioned q2hours and little to no relief. Pt appeared to get some relief with lidocaine patches. Per pt the back pain is "all across" his back.

## 2023-02-01 NOTE — Progress Notes (Signed)
Notified by RN that patient had a 2.5 second pause at 11:43. At this time, patient was asleep and was not wearing his Bi-PAP. Patient has a known history of OSA, and it is likely pause is due to untreated sleep apnea. Patient is now wearing bi-PAP. Continue to monitor on telemetry   Jonita Albee, PA-C 02/01/2023 12:27 PM

## 2023-02-01 NOTE — Progress Notes (Signed)
ABG sent to lab via tube station. Lab notified that there was an ABG on the way to them.

## 2023-02-01 NOTE — Progress Notes (Signed)
Pt not wearing BIPAP at this time. Order is PRN and HS. Patient resting comfortably on [place]. RT will monitor as needed.

## 2023-02-02 DIAGNOSIS — Z7189 Other specified counseling: Secondary | ICD-10-CM | POA: Diagnosis not present

## 2023-02-02 DIAGNOSIS — L8990 Pressure ulcer of unspecified site, unspecified stage: Secondary | ICD-10-CM

## 2023-02-02 DIAGNOSIS — I5033 Acute on chronic diastolic (congestive) heart failure: Secondary | ICD-10-CM | POA: Diagnosis not present

## 2023-02-02 DIAGNOSIS — N179 Acute kidney failure, unspecified: Secondary | ICD-10-CM

## 2023-02-02 DIAGNOSIS — J9602 Acute respiratory failure with hypercapnia: Secondary | ICD-10-CM | POA: Diagnosis not present

## 2023-02-02 DIAGNOSIS — N189 Chronic kidney disease, unspecified: Secondary | ICD-10-CM

## 2023-02-02 DIAGNOSIS — J9601 Acute respiratory failure with hypoxia: Secondary | ICD-10-CM | POA: Diagnosis not present

## 2023-02-02 DIAGNOSIS — N3000 Acute cystitis without hematuria: Secondary | ICD-10-CM | POA: Diagnosis not present

## 2023-02-02 DIAGNOSIS — I482 Chronic atrial fibrillation, unspecified: Secondary | ICD-10-CM | POA: Diagnosis not present

## 2023-02-02 DIAGNOSIS — E669 Obesity, unspecified: Secondary | ICD-10-CM

## 2023-02-02 DIAGNOSIS — I10 Essential (primary) hypertension: Secondary | ICD-10-CM | POA: Diagnosis not present

## 2023-02-02 LAB — BASIC METABOLIC PANEL
Anion gap: 6 (ref 5–15)
BUN: 34 mg/dL — ABNORMAL HIGH (ref 8–23)
CO2: 34 mmol/L — ABNORMAL HIGH (ref 22–32)
Calcium: 8.8 mg/dL — ABNORMAL LOW (ref 8.9–10.3)
Chloride: 102 mmol/L (ref 98–111)
Creatinine, Ser: 1.84 mg/dL — ABNORMAL HIGH (ref 0.61–1.24)
GFR, Estimated: 37 mL/min — ABNORMAL LOW (ref >60)
Glucose, Bld: 113 mg/dL — ABNORMAL HIGH (ref 70–99)
Potassium: 3.1 mmol/L — ABNORMAL LOW (ref 3.5–5.1)
Sodium: 142 mmol/L (ref 135–145)

## 2023-02-02 LAB — CULTURE, BLOOD (ROUTINE X 2): Culture: NO GROWTH

## 2023-02-02 LAB — GLUCOSE, CAPILLARY
Glucose-Capillary: 72 mg/dL (ref 70–99)
Glucose-Capillary: 145 mg/dL — ABNORMAL HIGH (ref 70–99)
Glucose-Capillary: 109 mg/dL — ABNORMAL HIGH (ref 70–99)
Glucose-Capillary: 126 mg/dL — ABNORMAL HIGH (ref 70–99)

## 2023-02-02 LAB — MAGNESIUM: Magnesium: 1.7 mg/dL (ref 1.7–2.4)

## 2023-02-02 MED ORDER — ALBUMIN HUMAN 25 % IV SOLN
25.0000 g | Freq: Once | INTRAVENOUS | Status: AC
  Administered 2023-02-02: 25 g via INTRAVENOUS
  Filled 2023-02-02: qty 100

## 2023-02-02 MED ORDER — POTASSIUM CHLORIDE CRYS ER 20 MEQ PO TBCR
40.0000 meq | EXTENDED_RELEASE_TABLET | ORAL | Status: AC
  Administered 2023-02-02 (×2): 40 meq via ORAL
  Filled 2023-02-02 (×2): qty 2

## 2023-02-02 MED ORDER — CEPHALEXIN 250 MG PO CAPS
250.0000 mg | ORAL_CAPSULE | Freq: Two times a day (BID) | ORAL | Status: AC
  Administered 2023-02-02 – 2023-02-05 (×6): 250 mg via ORAL
  Filled 2023-02-02 (×6): qty 1

## 2023-02-02 MED ORDER — MAGNESIUM SULFATE 2 GM/50ML IV SOLN
2.0000 g | Freq: Once | INTRAVENOUS | Status: AC
  Administered 2023-02-02: 2 g via INTRAVENOUS
  Filled 2023-02-02: qty 50

## 2023-02-02 NOTE — Assessment & Plan Note (Addendum)
CKD stage 3b Hypokalemia,  Today serum cr is 1,47 with K at 3,9 and serum bicarbonate at 36. Na is 138.   Continue diuretic therapy with furosemide and spironolactone.  Follow up renal function in 48 hrs.

## 2023-02-02 NOTE — Assessment & Plan Note (Signed)
Calculated BMI is 36,9 

## 2023-02-02 NOTE — Progress Notes (Signed)
Progress Note   Patient: [name] [name]:[id number] DOB: 04-23-1946 DOA: 01/30/2023     2 DOS: the patient was seen and examined on 02/02/2023   Brief hospital course: Mr. Cu was admitted to the hospital with the working diagnosis of heart failure exacerbation.   77 yom w/ diastolic CHF, migraine, T2DM,hypertension, dyslipidemia, GERD, OSA and pulmonary hypertension,presented to ED W/ acute onset of worsening dyspnea with associated orthopnea and paroxysmal nocturnal dyspnea as well as dyspnea on exertion and lower extremity edema. EMS was called at [facility] because patient had AMS, hypoxia and hematuria initially indwelling urinary catheter.  He is complaining of back pain from injury a couple of weeks ago.  For EMS EKG showed A-fib with heart rate 80-90, respiration 24 ETCO2 47 CBG 163 pulse ox 95% on 8 L normally 95% on 4 L, was not on oxygen on arrival.  In the ED patient was hypotensive on arrival, concerning for sepsis. He was c/o dysuria and urinary frequency and urgency. initially 84/40 and later on 91/62, RR 27 and later 19 and pulse 70 was 93% on 6 L O2>was placed on BiPAP for respiratory distress and pulse extremity was 94% on 40% FiO2 on BiPAP.  Labs: Creatinine of 2.03 and glucose of 141,albumin <1.5.Lactic acid was 1.4 and CBC showed anemia close to baseline.  INR was 1.9 PT 21.8 with a PTT of 40.  Influenza antigens, COVID-19 and RSV PCR's came back negative.  UA was positive for UTI.Urine and blood cultures were drawn. WJX:[id number] fibrillation with a rate of 101 Imaging: Portable chest x-ray showed finding consistent with CHF, however BNP not significantly elevated, CT head no acute finding.  Renal ultrasound increased echogenicity ABG suggestive of respiratory acidosis.  In ED given 1.75 L of IV LR, IV vancomycin, Flagyl and cefepime for initial hypotension and suspected sepsis. Patient's blood pressure improved, Dr Marchelle Gearing from critical care was consulted, repeat  blood gas showed improvement in pH  Patient is admitted under [facility] for further management.    Assessment and Plan: Acute on chronic diastolic CHF (congestive heart failure) (HCC) Echocardiogram with preserved LV systolic function with EF 60 to 65%, mild LVH, RV systolic function preserved, LA and RA with moderate dilatation.   Urine output is 3,970 ml Systolic blood pressure 129 to 106 mmHg.   Plan to continue furosemide 40 mg IV q12 hrs Had one dose of albumin IV today. Continue metoprolol.  Holding on RAAS inhibition due to risk of worsening renal function.  No SGLT 2 inh due to risk of urine infection.   Acute on chronic hypercapnic and hypoxemic respiratory failure Acute metabolic encephalopathy, resolved.  02 saturation is 96% on 7 L/min per [place].  Continue diuresis with furosemide. Supplemental 02 per [place].    Acute kidney injury superimposed on chronic kidney disease (HCC) CKD stage 3b Hypokalemia,  Renal function with serum cr at 1,84 with K at 3,1 and serum bicarbonate at 34. Na 142. Mg 1,7  Plan to continue K and Mg correction Continue diuresis with furosemide.   Essential hypertension Continue blood pressure control with metoprolol.   Atrial fibrillation, chronic (HCC) Continue rate control with metoprolol and anticoagulation with apixaban.   BPH (benign prostatic hyperplasia) Urinary tract infection.  - We will continue Flomax.  Gout - We will continue allopurinol.  Type 2 diabetes mellitus with hyperlipidemia (HCC) Continue insulin sliding scale for glucose cover and monitoring.  Continue with statin therapy   Class 2 obesity Calculated BMI  is 36,9   Sepsis secondary to UTI (HCC) Urine culture with no growth.  Culture from 01/12/23 with 20,000 CFU klebsiella pneumonia.   Patient has been afebrile and no leukocytosis.  Will transition to oral cephalexin.         Subjective: Patient with improvement in dyspnea, but not back to baseline, no chest  pain.   Physical Exam: Vitals:   02/02/23 0457 02/02/23 0730 02/02/23 1120 02/02/23 1125  BP: 124/77 129/69 107/64 106/61  Pulse: (!) 52 71 (!) 43 75  Resp: 17 13 (!) 25 (!) 23  Temp: 97.7 F (36.5 C) 97.8 F (36.6 C) 98.2 F (36.8 C) 98.2 F (36.8 C)  TempSrc: Axillary     SpO2: 96% 98% 96% 96%  Weight: 110.2 kg     Height:       Neurology awake and alert, deconditioned ENT with mild pallor Cardiovascular with S1 and S2 present, irregularly irregular with no gallops, rubs or murmurs Respiratory with mild rales at dependent zones Abdomen with no distention  Positive lower extremity edema  Data Reviewed:    Family Communication: no family at the bedside   Disposition: Status is: Inpatient Remains inpatient appropriate because: heart failure   Planned Discharge Destination: Home      Author: Coralie Keens, MD 02/02/2023 3:15 PM  For on call review [email or web address].

## 2023-02-02 NOTE — Progress Notes (Signed)
Daily Progress Note   Patient Name: [name] [name]       Date: 02/02/2023 DOB: [birth year]  Age: 77 y.o. MRN#: [id number] Attending Physician: Coralie Keens Primary Care Physician: Corwin Levins, MD Admit Date: 01/30/2023  Reason for Consultation/Follow-up: Establishing goals of care  Subjective: Medical records reviewed including progress notes, labs, imaging. Patient assessed at the bedside.  He denies pain or distress.  His son is present visiting.  Emotional support therapeutic listening was provided as patient's son described difficulty they are having with his watchful waiting period and uncertainty of whether patient will improve or not.  We reviewed patient's recent lab work and fluctuating vital signs, particularly heart rate.  Patient's son remains cautiously optimistic and appreciative of palliative support.  Questions and concerns addressed. PMT will continue to support holistically.   Length of Stay: 2   Physical Exam Vitals and nursing note reviewed.  Constitutional:      General: He is not in acute distress.    Appearance: He is ill-appearing.     Comments: BiPAP in place  Cardiovascular:     Rate and Rhythm: Normal rate.  Pulmonary:     Effort: Pulmonary effort is normal. No respiratory distress.  Neurological:     Mental Status: He is easily aroused.            Vital Signs: BP 129/69 (BP Location: Left Wrist)   Pulse 71   Temp 97.8 F (36.6 C)   Resp 13   Ht 5\' 8"  (1.727 m)   Wt 110.2 kg   SpO2 98%   BMI 36.94 kg/m  SpO2: SpO2: 98 % O2 Device: O2 Device: CPAP O2 Flow Rate: O2 Flow Rate (L/min): 5 L/min      Palliative Assessment/Data: 30 to 40%   Palliative Care Assessment & Plan   Patient Profile: 77 y.o. male  with past medical history of  diastolic CHF, migraine, type 2 diabetes mellitus, hypertension, dyslipidemia, GERD, OSA and pulmonary hypertension admitted on 01/30/2023 with worsening dyspnea, orthopnea, PND, dyspnea on exertion, bilateral edema in lower extremities.    Patient was admitted for acute on chronic diastolic CHF, acute hypoxic and hypercapnic respiratory failure.  Patient has been admitted 3 times in the past 6 months, most recently less than 30 days ago. PMT has been consulted to  assist with goals of care conversation.  Assessment: Goals of care conversation Acute on chronic respiratory failure with hypoxia and hypercarbia Acute metabolic encephalopathy Acute on chronic diastolic CHF Acute pulmonary edema AKI on CKD 3A Multiple pressure ulcers  Recommendations/Plan: Continue full code (family wants 1 attempt of CPR, NO intubation. Son understands partial code is not currently an option) NO mechanical ventilation if respiratory status further deteriorates, full scope treatment otherwise Ongoing goals of care discussions pending clinical course Psychosocial and emotional support provided PMT will continue to follow and support   Prognosis: Guarded to poor  Discharge Planning: To Be Determined  Care plan was discussed with patient, patient's son   Total time: I spent 35 minutes in the care of the patient today in the above activities and documenting the encounter.   Richardson Dopp, PA-C Palliative Medicine Team Team phone # [phone]  Thank you for allowing the Palliative Medicine Team to assist in the care of this patient. Please utilize secure chat with additional questions, if there is no response within 30 minutes please call the above phone number.  Palliative Medicine Team providers are available by phone from 7am to 7pm daily and can be reached through the team cell phone.  Should this patient require assistance outside of these hours, please call the patient's attending  physician.  Portions of this note are a verbal dictation therefore any spelling and/or grammatical errors are due to the "[employer] One" system interpretation.

## 2023-02-02 NOTE — Progress Notes (Signed)
Pt observed going back to sleep after repositioning. Placed pt back on BiPAP.

## 2023-02-02 NOTE — Progress Notes (Signed)
Patient Name: [name] [name] Date of Encounter: 02/02/2023 [facility] Cardiologist: Olga Millers, MD   Interval Summary  .    Patient agitated on my exam when awake but also falls asleep quickly. Currently with BiPAP in place. Unable to complete ROS.  Vital Signs .    Vitals:   02/01/23 2256 02/02/23 0007 02/02/23 0457 02/02/23 0730  BP:  (!) 92/56 124/77 129/69  Pulse: 77 64 (!) 52 71  Resp: 20 18 17 13   Temp:  97.7 F (36.5 C) 97.7 F (36.5 C) 97.8 F (36.6 C)  TempSrc:  Oral Axillary   SpO2: 90% 93% 96% 98%  Weight:   110.2 kg   Height:        Intake/Output Summary (Last 24 hours) at 02/02/2023 0853 Last data filed at 02/02/2023 0525 Gross per 24 hour  Intake 729.36 ml  Output 2970 ml  Net -2240.64 ml      02/02/2023    4:57 AM 02/01/2023    3:50 AM 01/31/2023    1:00 PM  Last 3 Weights  Weight (lbs) 242 lb 15.2 oz 249 lb 5.4 oz 247 lb 2.2 oz  Weight (kg) 110.2 kg 113.1 kg 112.1 kg      Telemetry/ECG    Telemetry shows atrial fibrillation with controlled ventricular rates and intermittent pauses ~2.2 seconds - Personally Reviewed  Physical Exam .   GEN: Agitated with anasarcic appearance. Neck: difficult to assess JVP 2/2 body habitus Cardiac: irregularly irregular, no murmurs, rubs, or gallops.  Respiratory: Clear to auscultation bilaterally. GI: Soft, nontender MS: 3+ lower extremity pitting edema. Upper extremity edema as well, more mixed than pitting.   Assessment & Plan .     Acute on chronic diastolic CHF Acute respiratory failure, hypoxia and hypercarbia  Patient [facility] the ED [facility] 6/24 from [facility]F with AMS, hypoxia, hematuria. He was initially hypotensive and was placed on BiPAP for respiratory distress. BNP elevated to 230 and CXR consistent with CHF. Last echo from April 2024 showed LVEF 60-65% with mild LVH, normal RV function, moderate biatrial enlargement.   Suspect patient had accumulation of volume after recent discharge following  sepsis admission off diuretics.  Only net negative 2.7 liters despite significant urine output 2/2 IV fluid administration in the ED on arrival. Continue IV lasix 40mg  BID today. GDMT limited by BP and recent sepsis 2/2 UTI. Would not add either Spironolactone or SGLT2 at this time.  Permanent atrial fibrillation  PTA meds included Diltiazem 120mg  every day and Metoprolol Tartrate 25mg  BID. Continues to have adequate rate control with Metoprolol only (Diltiazem held due to soft BP).  Continue Metoprolol Tartrate 25mg  BID Continue Eliquis 5mg  BID  Dyslipidemia  Continue Atorvastatin 20mg .  Lab Results  Component Value Date   CHOL 102 10/08/2022   HDL 32.50 (L) 10/08/2022   LDLCALC 42 10/08/2022   TRIG 135.0 10/08/2022   CHOLHDL 3 10/08/2022   OSA  Patient with intermittent pauses of up to about 2.5 seconds on telemetry. Suspect that OSA is primary contributing factor, will need to use BiPAP.  AKI on CKD stage IIIa  Recent baseline creatine appears to be 1.5-1.6. Trend this admission has been 2.03->1.97->1.92->2.12->1.84. Morning BMP pending. Will need to continue following closely with ongoing diuresis.   Per primary team: Acute metabolic encephalopathy BPH DM type II Gout Pressure ulcers  For questions or updates, please contact [facility] Please consult [email or web address] for contact info under        Signed,  Perlie Gold, PA-C

## 2023-02-02 NOTE — Assessment & Plan Note (Addendum)
Urine culture with no growth.  Culture from 01/12/23 with 20,000 CFU klebsiella pneumonia.   Patient has been afebrile and no leukocytosis.  Continue with oral cephalexin total of 3 days.

## 2023-02-02 NOTE — Progress Notes (Signed)
Pt did well overnight maintaining BiPAP on. Pt is still visibly uncomfortable despite frequent repositioning, off-loading, & use of lidocaine patches. Offered pt prn ultram this morning but he declined. Call light placed in pt's hand. Bed in lowest position.

## 2023-02-02 NOTE — Assessment & Plan Note (Signed)
New diagnosis 1 month ago.  Remains in atrial fibrillation with controlled rate.  Patient has elected against anticoagulation due to history of epistaxis.  CHA2DS2-VASc score is at least 4 suggesting a 4.8% yearly stroke risk.  Risk versus benefits discussed with patient.  Recommended he discuss further with his cardiology team. -Continue Toprol-XL -Continue discussions regarding anticoagulation 

## 2023-02-02 NOTE — Assessment & Plan Note (Addendum)
Echocardiogram with preserved LV systolic function with EF 60 to 65%, mild LVH, RV systolic function preserved, LA and RA with moderate dilatation.   Fluid status is negative, since admission, -14,954 ml.  Systolic blood pressure systolic blood pressure 104 to 96 mmHg.   06/26 albumin IV   Continue metoprolol. Diuresis spironolactone, hold on furosemide for now due to signs of low volume, will check renal function in am, before resumption of furosemide, his dose is 40 mg daily.   No SGLT 2 inh due to risk of urine infection, urinary retention, foley catheter in place.   Acute on chronic hypercapnic and hypoxemic respiratory failure Acute metabolic encephalopathy, resolved.  02 saturation is 100% on 3 L/min per [place].  Supplemental 02 per [place]. (Patient has home 02).

## 2023-02-02 NOTE — TOC Initial Note (Signed)
Transition of Care [facility]) - Initial/Assessment Note    Patient Details  Name: [name] [name] MRN: [id number] Date of Birth: 11-15-1945  Transition of Care [facility]) CM/SW Contact:    Leone Haven, RN Phone Number: 02/02/2023, 3:16 PM  Clinical Narrative:                 NCM spoke with patient at the bedside, he states to call his daughter, [name].  NCM called [name], she states he lives with her, but was at [facility] and she does not want him to go back there, she would like for him to go to another SNF. NCM informed her will let the CSW know this information.  He has PCP and insurance on file.  He did have HH with [facility] previously but then went to SNF.  He has hospital bed, bipap, w/chair, home oxygen 3 liters with [employer], ewalker, and shower chair at home.  He gets his medications thru [facility] and [facility] in [place].  NCM made CSW aw\are of  this conversation with daughter.  Expected Discharge Plan: Skilled Nursing Facility Barriers to Discharge: Continued Medical Work up   Patient Goals and CMS Choice Patient states their goals for this hospitalization and ongoing recovery are:: SNF          Expected Discharge Plan and Services In-house Referral: Clinical Social Work Discharge Planning Services:  (CSW) Post Acute Care Choice: Skilled Nursing Facility Living arrangements for the past 2 months: Skilled Nursing Facility                 DME Arranged: N/A DME Agency: NA                  Prior Living Arrangements/Services Living arrangements for the past 2 months: Skilled Nursing Facility Lives with:: Facility Resident, Spouse Patient language and need for interpreter reviewed:: Yes        Need for Family Participation in Patient Care: Yes (Comment) Care giver support system in place?: Yes (comment) Current home services: DME (hospital bed, bipap, home oxygen 3 liters with [employer], w/chair, walker, BSC, shower chair,) Criminal Activity/Legal Involvement  Pertinent to Current Situation/Hospitalization: No - Comment as needed  Activities of Daily Living Home Assistive Devices/Equipment: Environmental consultant (specify type) ADL Screening (condition at time of admission) Patient's cognitive ability adequate to safely complete daily activities?: No Is the patient deaf or have difficulty hearing?: Yes Does the patient have difficulty seeing, even when wearing glasses/contacts?: No Does the patient have difficulty concentrating, remembering, or making decisions?: Yes Patient able to express need for assistance with ADLs?: Yes Does the patient have difficulty dressing or bathing?: Yes Communication: Needs assistance Is this a change from baseline?: Pre-admission baseline Dressing (OT): Dependent Is this a change from baseline?: Pre-admission baseline Grooming: Dependent Is this a change from baseline?: Pre-admission baseline Feeding: Needs assistance (Set Up) Bathing: Dependent Is this a change from baseline?: Pre-admission baseline Toileting: Dependent Is this a change from baseline?: Pre-admission baseline In/Out Bed: Dependent Is this a change from baseline?: Pre-admission baseline Walks in Home: Dependent Is this a change from baseline?: Change from baseline, expected to last <3 days Does the patient have difficulty walking or climbing stairs?: Yes Weakness of Legs: Both Weakness of Arms/Hands: Both  Permission Sought/Granted Permission sought to share information with : Case Manager, Family Supports (Daughter, [name]) Permission granted to share information with : Yes, Verbal Permission Granted  Share Information with NAME: [name]  Permission granted to share info w  AGENCY: SNF  Permission granted to share info w Relationship: Daughter  Permission granted to share info w Contact Information: [name] [phone]  Emotional Assessment Appearance:: Appears stated age Attitude/Demeanor/Rapport: Engaged Affect (typically observed):  Appropriate Orientation: : Oriented to Self, Oriented to Place, Oriented to  Time, Oriented to Situation Alcohol / Substance Use: Not Applicable Psych Involvement: No (comment)  Admission diagnosis:  Vitamin B 12 deficiency [E53.8] Elevated creatine kinase [R74.8] Acute cystitis without hematuria [N30.00] Acute on chronic diastolic CHF (congestive heart failure) (HCC) [I50.33] Acute respiratory failure with hypoxia and hypercapnia (HCC) [J96.01, J96.02] Patient Active Problem List   Diagnosis Date Noted   Pressure injury of skin 02/02/2023   Class 2 obesity 02/02/2023   Acute kidney injury superimposed on chronic kidney disease (HCC) 02/02/2023   Acute on chronic diastolic CHF (congestive heart failure) (HCC) 01/31/2023   Acute respiratory failure with hypoxia and hypercarbia (HCC) 01/31/2023   Dyslipidemia 01/31/2023   Type 2 diabetes mellitus with hyperlipidemia (HCC) 01/31/2023   BPH (benign prostatic hyperplasia) 01/31/2023   Acute respiratory failure with hypoxia and hypercapnia (HCC) 01/31/2023   Acute on chronic respiratory failure with hypoxia and hypercapnia (HCC) 01/31/2023   Acute pulmonary edema (HCC) 01/31/2023   Bacteremia 01/15/2023   Complicated UTI (urinary tract infection) 01/13/2023   Sepsis secondary to UTI (HCC) 01/13/2023   Closed fracture of eleventh thoracic vertebra (HCC) 11/23/2022   Closed L2 vertebral fracture (HCC) 11/18/2022   Atrial fibrillation, chronic (HCC) 11/18/2022   L1 vertebral fracture (HCC) 11/18/2022   Subacute cough 10/10/2022   Fatigue 10/08/2022   Chronic respiratory failure with hypoxia and hypercapnia related to obesity hypoventilation syndrome 03/19/2022   Chronic diastolic CHF (congestive heart failure) (HCC) 03/19/2022   Actinic keratosis 11/27/2021   Sebaceous cyst 11/27/2021   Urinary frequency 10/17/2021   Left arm swelling 01/18/2021   Vitamin D deficiency 10/10/2020   B12 deficiency 10/10/2020   Lower extremity weakness  09/12/2020   Lesion of right ear 04/13/2020   Arthritis of right acromioclavicular joint 04/02/2020   Right rotator cuff tear 04/02/2020   Greater trochanteric bursitis, right 08/14/2019   Tick bite, infected 12/19/2018   Degenerative arthritis of right knee 02/21/2017   Right knee pain 02/15/2017   Volume overload 12/23/2015   OSA (obstructive sleep apnea) 12/23/2015   Pulmonary hypertension (HCC) 12/23/2015   Gout 12/23/2015   Allergic rhinitis, cause unspecified 11/21/2013   DOE (dyspnea on exertion) 12/20/2012   Left knee pain 07/11/2012   Increased prostate specific antigen (PSA) velocity 01/06/2012   Encounter for well adult exam with abnormal findings 12/25/2010   Long term (current) use of anticoagulants 11/06/2010   HEMATOCHEZIA 06/26/2010   BRONCHITIS, CHRONIC 01/02/2009   Obesity hypoventilation syndrome (HCC) 11/26/2008   Permanent atrial fibrillation 10/16/2008   PERIPHERAL EDEMA 01/05/2008   Non-insulin dependent type 2 diabetes mellitus (HCC) 06/09/2007   COMMON MIGRAINE 06/09/2007   Unspecified hearing loss 06/09/2007   PROSTATE SPECIFIC ANTIGEN, ELEVATED 06/09/2007   Hyperlipidemia 02/18/2007   Severe obesity (BMI >= 40) (HCC) 02/18/2007   Essential hypertension 02/18/2007   PCP:  Corwin Levins, MD Pharmacy:   [facility] - [place], [place] - [address] [place] [address] Phone: [phone] Fax: [phone]     Social Determinants of Health (SDOH) Social History: SDOH Screenings   Food Insecurity: No Food Insecurity (01/31/2023)  Housing: Low Risk  (01/31/2023)  Transportation Needs: No Transportation Needs (01/31/2023)  Utilities: Not At Risk (  01/31/2023)  Depression (PHQ2-9): Low Risk  (10/08/2022)  Tobacco Use: Medium Risk (01/31/2023)   SDOH Interventions:     Readmission Risk Interventions    01/14/2023    1:35 PM  Readmission Risk Prevention Plan  Transportation Screening Complete  Medication  Review (RN Care Manager) Complete  PCP or Specialist appointment within 3-5 days of discharge Complete  HRI or Home Care Consult Complete  Palliative Care Screening Not Applicable  Skilled Nursing Facility Not Applicable

## 2023-02-03 DIAGNOSIS — N179 Acute kidney failure, unspecified: Secondary | ICD-10-CM | POA: Diagnosis not present

## 2023-02-03 DIAGNOSIS — E669 Obesity, unspecified: Secondary | ICD-10-CM

## 2023-02-03 DIAGNOSIS — I5033 Acute on chronic diastolic (congestive) heart failure: Secondary | ICD-10-CM | POA: Diagnosis not present

## 2023-02-03 DIAGNOSIS — I482 Chronic atrial fibrillation, unspecified: Secondary | ICD-10-CM | POA: Diagnosis not present

## 2023-02-03 DIAGNOSIS — I10 Essential (primary) hypertension: Secondary | ICD-10-CM | POA: Diagnosis not present

## 2023-02-03 DIAGNOSIS — J9601 Acute respiratory failure with hypoxia: Secondary | ICD-10-CM | POA: Diagnosis not present

## 2023-02-03 DIAGNOSIS — N3000 Acute cystitis without hematuria: Secondary | ICD-10-CM | POA: Diagnosis not present

## 2023-02-03 DIAGNOSIS — R601 Generalized edema: Secondary | ICD-10-CM | POA: Diagnosis not present

## 2023-02-03 LAB — BASIC METABOLIC PANEL
Anion gap: 8 (ref 5–15)
BUN: 31 mg/dL — ABNORMAL HIGH (ref 8–23)
CO2: 34 mmol/L — ABNORMAL HIGH (ref 22–32)
Calcium: 8.8 mg/dL — ABNORMAL LOW (ref 8.9–10.3)
Chloride: 98 mmol/L (ref 98–111)
Creatinine, Ser: 1.71 mg/dL — ABNORMAL HIGH (ref 0.61–1.24)
GFR, Estimated: 41 mL/min — ABNORMAL LOW (ref >60)
Glucose, Bld: 141 mg/dL — ABNORMAL HIGH (ref 70–99)
Potassium: 3.6 mmol/L (ref 3.5–5.1)
Sodium: 140 mmol/L (ref 135–145)

## 2023-02-03 LAB — GLUCOSE, CAPILLARY
Glucose-Capillary: 101 mg/dL — ABNORMAL HIGH (ref 70–99)
Glucose-Capillary: 104 mg/dL — ABNORMAL HIGH (ref 70–99)
Glucose-Capillary: 105 mg/dL — ABNORMAL HIGH (ref 70–99)
Glucose-Capillary: 153 mg/dL — ABNORMAL HIGH (ref 70–99)

## 2023-02-03 LAB — CULTURE, BLOOD (ROUTINE X 2): Special Requests: ADEQUATE

## 2023-02-03 LAB — MAGNESIUM: Magnesium: 1.7 mg/dL (ref 1.7–2.4)

## 2023-02-03 MED ORDER — TRAMADOL HCL 50 MG PO TABS
50.0000 mg | ORAL_TABLET | Freq: Four times a day (QID) | ORAL | Status: DC | PRN
  Administered 2023-02-03 – 2023-02-13 (×8): 50 mg via ORAL
  Filled 2023-02-03 (×10): qty 1

## 2023-02-03 MED ORDER — MAGNESIUM SULFATE 2 GM/50ML IV SOLN
2.0000 g | Freq: Once | INTRAVENOUS | Status: AC
  Administered 2023-02-03: 2 g via INTRAVENOUS
  Filled 2023-02-03: qty 50

## 2023-02-03 MED ORDER — POTASSIUM CHLORIDE CRYS ER 20 MEQ PO TBCR
40.0000 meq | EXTENDED_RELEASE_TABLET | Freq: Once | ORAL | Status: AC
  Administered 2023-02-03: 40 meq via ORAL
  Filled 2023-02-03: qty 2

## 2023-02-03 MED ORDER — ALBUMIN HUMAN 25 % IV SOLN
25.0000 g | Freq: Once | INTRAVENOUS | Status: AC
  Administered 2023-02-03: 25 g via INTRAVENOUS
  Filled 2023-02-03: qty 100

## 2023-02-03 NOTE — Progress Notes (Signed)
   02/03/23 1217  Spiritual Encounters  Type of Visit Initial  Care provided to: Patient  Conversation partners present during encounter Nurse (RN present at the beginning of visit.)  Referral source APP (PMT PA-Josseline)  Reason for visit Routine spiritual support (Rapport building in the space of watchful waiting.)  Spiritual Framework  Presenting Themes Values and beliefs (Faith and family)  Values/beliefs Pt. believes family relationships are reciprical and withstand the test of time.  Community/Connection Spiritual leader Soil scientist clergy visiting.)  Patient Stress Factors Health changes (Unknown.)  Goals  Self/Personal Goals Discern quality of life and goals of care with a supportive family.  Interventions  Spiritual Care Interventions Made Compassionate presence  Intervention Outcomes  Outcomes Connection to spiritual care  Spiritual Care Plan  Spiritual Care Issues Still Outstanding Chaplain will continue to follow   Chaplain Stephanie Acre [phone]

## 2023-02-03 NOTE — Progress Notes (Signed)
   02/03/23 2238  BiPAP/CPAP/SIPAP  BiPAP/CPAP/SIPAP Pt Type Adult  BiPAP/CPAP/SIPAP Resmed  Mask Type Full face mask  Mask Size Medium  Respiratory Rate 18 breaths/min  IPAP  (Ritchie 18)  EPAP  (min 8)  Pressure Support 6 cmH20  Flow Rate 5 lpm  Patient Home Equipment No  Auto Titrate Yes  CPAP/SIPAP surface wiped down Yes  BiPAP/CPAP /SiPAP Vitals  Pulse Rate 70  Resp 18  SpO2 98 %  Bilateral Breath Sounds Diminished  MEWS Score/Color  MEWS Score 0  MEWS Score Color [name] [name]

## 2023-02-03 NOTE — Care Management Important Message (Signed)
Important Message  Patient Details  Name: [name] [name] MRN: [id number] Date of Birth: 1946/04/07   Medicare Important Message Given:  Yes     Renie Ora 02/03/2023, 10:42 AM

## 2023-02-03 NOTE — Progress Notes (Signed)
Daily Progress Note   Patient Name: [name] [name]       Date: 02/03/2023 DOB: 1946/07/17  Age: 77 y.o. MRN#: [id number] Attending Physician: Coralie Keens Primary Care Physician: Corwin Levins, MD Admit Date: 01/30/2023  Reason for Consultation/Follow-up: Establishing goals of care  Subjective: Medical records reviewed including progress notes, labs, imaging. Patient assessed at the bedside.  He denies pain or distress. Breathing comfortably on 5L [place], endorses fatigue.  Patient shares that he does not have any questions or concerns at this time. He understands that he is sick and making slow improvements. He is unsure where he family currently is. Provided update that spiritual care support visit may occur soon.   I then called patient's daughter [name] for palliative support. She is satisfied with patient's current care and continues to await further improvement. Provided update that I would be off service until Monday 7/1 and she is open to follow up at that time. Encouraged her to reach out to PMT should needs arise in the meantime.  Questions and concerns addressed. PMT will continue to support holistically.   Length of Stay: 3   Physical Exam Vitals and nursing note reviewed.  Constitutional:      General: He is not in acute distress.    Appearance: He is ill-appearing.  Cardiovascular:     Rate and Rhythm: Normal rate.  Pulmonary:     Effort: Pulmonary effort is normal. No respiratory distress.  Neurological:     Mental Status: He is easily aroused.             Vital Signs: BP 114/74 (BP Location: Left Arm)   Pulse 83   Temp 97.7 F (36.5 C) (Axillary)   Resp 18   Ht 5\' 8"  (1.727 m)   Wt 108.4 kg   SpO2 97%   BMI 36.34 kg/m  SpO2: SpO2: 97 % O2 Device: O2  Device: Nasal Cannula O2 Flow Rate: O2 Flow Rate (L/min): 5 L/min      Palliative Assessment/Data: 30%   Palliative Care Assessment & Plan   Patient Profile: 77 y.o. male  with past medical history of diastolic CHF, migraine, type 2 diabetes mellitus, hypertension, dyslipidemia, GERD, OSA and pulmonary hypertension admitted on 01/30/2023 with worsening dyspnea, orthopnea, PND, dyspnea on exertion, bilateral edema in lower extremities.  Patient was admitted for acute on chronic diastolic CHF, acute hypoxic and hypercapnic respiratory failure.  Patient has been admitted 3 times in the past 6 months, most recently less than 30 days ago. PMT has been consulted to assist with goals of care conversation.  Assessment: Goals of care conversation Acute on chronic respiratory failure with hypoxia and hypercarbia Acute metabolic encephalopathy Acute on chronic diastolic CHF Acute pulmonary edema AKI on CKD 3A Multiple pressure ulcers  Recommendations/Plan: Continue full code (family wants 1 attempt of CPR, NO intubation. Son understands partial code is not currently an option) NO mechanical ventilation if respiratory status further deteriorates, full scope treatment otherwise Ongoing goals of care discussions pending clinical course Psychosocial and emotional support provided PMT will see again Monday 7/1. Please secure chat or call team line if urgent needs arise   Prognosis: Guarded to poor  Discharge Planning: To Be Determined  Care plan was discussed with patient, patient's daughter   Total time: I spent 35 minutes in the care of the patient today in the above activities and documenting the encounter.   Richardson Dopp, PA-C Palliative Medicine Team Team phone # [phone]  Thank you for allowing the Palliative Medicine Team to assist in the care of this patient. Please utilize secure chat with additional questions, if there is no response within 30 minutes please call the  above phone number.  Palliative Medicine Team providers are available by phone from 7am to 7pm daily and can be reached through the team cell phone.  Should this patient require assistance outside of these hours, please call the patient's attending physician.  Portions of this note are a verbal dictation therefore any spelling and/or grammatical errors are due to the "[employer] One" system interpretation.

## 2023-02-03 NOTE — Progress Notes (Addendum)
Progress Note   [name] [name]: [name] [name] UXL:[id number] DOB: 1946-05-05 DOA: 01/30/2023     3 DOS: the [name] [name] was seen and examined on 02/03/2023   Brief hospital course: [name] [name] was admitted to the hospital with the working diagnosis of heart failure exacerbation.   77 yom w/ diastolic CHF, migraine, T2DM,hypertension, dyslipidemia, GERD, OSA and pulmonary hypertension,presented to ED W/ acute onset of worsening dyspnea with associated orthopnea and paroxysmal nocturnal dyspnea as well as dyspnea on exertion and lower extremity edema. EMS was called at [facility] because [name] [name] had AMS, hypoxia and hematuria initially indwelling urinary catheter.  He is complaining of back pain from injury a couple of weeks ago.  For EMS EKG showed A-fib with heart rate 80-90, respiration 24 ETCO2 47 CBG 163 pulse ox 95% on 8 L normally 95% on 4 L, was not on oxygen on arrival.  In the ED [name] [name] was hypotensive on arrival, concerning for sepsis. He was c/o dysuria and urinary frequency and urgency. initially 84/40 and later on 91/62, RR 27 and later 19 and pulse 70 was 93% on 6 L O2>was placed on BiPAP for respiratory distress and pulse extremity was 94% on 40% FiO2 on BiPAP.  Labs: Creatinine of 2.03 and glucose of 141,albumin <1.5.Lactic acid was 1.4 and CBC showed anemia close to baseline.  INR was 1.9 PT 21.8 with a PTT of 40.  Influenza antigens, COVID-19 and RSV PCR's came back negative.  UA was positive for UTI.Urine and blood cultures were drawn. [id number]:[id number] fibrillation with a rate of 101 Imaging: Portable chest x-ray showed finding consistent with CHF, however BNP not significantly elevated, CT head no acute finding.  Renal ultrasound increased echogenicity ABG suggestive of respiratory acidosis.  In ED given 1.75 L of IV LR, IV vancomycin, Flagyl and cefepime for initial hypotension and suspected sepsis. [name] [name]'s blood pressure improved, Dr Marchelle Gearing from critical care was consulted, repeat  blood gas showed improvement in pH  [name] [name] is admitted under [facility] for further management.    06/27 [name] [name] responding well to diuresis.   Assessment and Plan: Acute on chronic diastolic CHF (congestive heart failure) (HCC) Echocardiogram with preserved LV systolic function with EF 60 to 65%, mild LVH, RV systolic function preserved, LA and RA with moderate dilatation.   Urine output is 3,900 ml Systolic blood pressure 119 to 114 mmHg.   06/26 albumin IV  Continue diuresis with furosemide 40 mg IV q12  Continue metoprolol.  Holding on RAAS inhibition due to risk of worsening renal function.  No SGLT 2 inh due to risk of urine infection.   Acute on chronic hypercapnic and hypoxemic respiratory failure Acute metabolic encephalopathy, resolved.  02 saturation is 97% on 5 L/min per [place].  Supplemental 02 per [place]. ([name] [name] has home 02).    Acute kidney injury superimposed on chronic kidney disease (HCC) CKD stage 3b Hypokalemia,  Volume status is improving, renal function with serum cr at 1,71 with K at 3,6 and serum bicarbonate at 34.  Na 140 and Mg 1.7   Continue Kcl and Mg sulfate for electrolyte correction. Continue furosemide IV Add 40 kcl and 2 g mag sulfate.  Follow up renal function and electrolytes in am.   Essential hypertension Continue blood pressure control with metoprolol.   Atrial fibrillation, chronic (HCC) Continue rate control with metoprolol and anticoagulation with apixaban.   BPH (benign prostatic hyperplasia) Urinary tract infection.  - We will continue Flomax.  Gout continue allopurinol.  Type 2  diabetes mellitus with hyperlipidemia (HCC) Continue insulin sliding scale for glucose cover and monitoring.  Continue with statin therapy   Pressure injury of skin  Active Pressure Injury/Wound(s)     Pressure Ulcer  Duration          Pressure Injury 01/31/23 Buttocks Left Deep Tissue Pressure Injury - Purple or maroon localized area of discolored  intact skin or blood-filled blister due to damage of underlying soft tissue from pressure and/or shear. Buttock Blanchable Purple; PT has s 3 days   Pressure Injury 01/31/23 Buttocks Right Stage 3 -  Full thickness tissue loss. Subcutaneous fat may be visible but bone, tendon or muscle are NOT exposed. 3 days   Pressure Injury 01/31/23 Perineum Posterior Unstageable - Full thickness tissue loss in which the base of the injury is covered by slough (yellow, tan, gray, green or brown) and/or eschar (tan, brown or black) in the wound bed. Pressure Wound Posterior S 3 days         Class 2 obesity Calculated BMI is 36,9   Sepsis secondary to UTI (HCC) Urine culture with no growth.  Culture from 01/12/23 with 20,000 CFU klebsiella pneumonia.   [name] [name] has been afebrile and no leukocytosis.  Continue with oral cephalexin.         Subjective: [name] [name] with improvement in dyspnea and edema, continue very weak and deconditioned.   Physical Exam: Vitals:   02/03/23 0500 02/03/23 0715 02/03/23 0840 02/03/23 1110  BP:  114/74  119/81  Pulse:  97 83 68  Resp:  17 18 19   Temp:  97.7 F (36.5 C)  98 F (36.7 C)  TempSrc:  Axillary  Oral  SpO2:  100% 97% 98%  Weight: 108.4 kg     Height:       Neurology awake and alert ENT with mild pallor Cardiovascular with S1 and S2 present with no gallops, rubs or murmurs No JVD Trace non pitting lower extremity edema Respiratory with no rales or wheezing, no rhonchi Abdomen with no distention  Data Reviewed:    Family Communication: no family at the bedside.  I spoke with [name] [name]'s daughter, we talked in detail about [name] [name]'s condition, plan of care and prognosis and all questions were addressed.   Disposition: Status is: Inpatient Remains inpatient appropriate because: heart failure   Planned Discharge Destination: Skilled nursing facility     Author: Coralie Keens, MD 02/03/2023 3:41 PM  For on call review  [email or web address].

## 2023-02-03 NOTE — Assessment & Plan Note (Addendum)
  Active Pressure Injury/Wound(s)     Pressure Ulcer  Duration          Pressure Injury 01/31/23 Buttocks Left Deep Tissue Pressure Injury - Purple or maroon localized area of discolored intact skin or blood-filled blister due to damage of underlying soft tissue from pressure and/or shear. Buttock Blanchable Purple; PT has s 3 days   Pressure Injury 01/31/23 Buttocks Right Stage 3 -  Full thickness tissue loss. Subcutaneous fat may be visible but bone, tendon or muscle are NOT exposed. 3 days   Pressure Injury 01/31/23 Perineum Posterior Unstageable - Full thickness tissue loss in which the base of the injury is covered by slough (yellow, tan, gray, green or brown) and/or eschar (tan, brown or black) in the wound bed. Pressure Wound Posterior S 3 days

## 2023-02-03 NOTE — Progress Notes (Signed)
DAILY PROGRESS NOTE   Patient Name: [name] [name] Date of Encounter: 02/03/2023 Cardiologist: Olga Millers, MD  Chief Complaint   Breathing better  Patient Profile   [name] [name] is a 77 y.o. male with a hx of permanent atrial fibrillation, R sided heart failure secondary felt secondary to pulmonary HTN (OSA, OHS, possible pulmonary venous HTN), migraines, type 2 DM, HTN, HLD, GERD who is being seen 01/31/2023 for the evaluation of CHF at the request of Dr. Jonathon Bellows   Subjective   Diuresed another 3.3L yesterday, now 6.1L negative. Labs pending today. Weight down from 113 kg to 108 kg today.  Objective   Vitals:   02/03/23 0432 02/03/23 0500 02/03/23 0715 02/03/23 0840  BP:   114/74   Pulse: 73  97 83  Resp: 16  17 18   Temp:   97.7 F (36.5 C)   TempSrc:   Axillary   SpO2: 100%  100% 97%  Weight:  108.4 kg    Height:        Intake/Output Summary (Last 24 hours) at 02/03/2023 0920 Last data filed at 02/03/2023 6301 Gross per 24 hour  Intake 780.13 ml  Output 3900 ml  Net -3119.87 ml   Filed Weights   02/01/23 0350 02/02/23 0457 02/03/23 0500  Weight: 113.1 kg 110.2 kg 108.4 kg    Physical Exam   General appearance: alert, no distress, morbidly obese, pale, and anasarcic Neck: JVD - several cm above sternal notch, no carotid bruit, and thyroid not enlarged, symmetric, no tenderness/mass/nodules Lungs: diminished breath sounds bibasilar Heart: regular rate and rhythm Abdomen: obese Extremities: edema 2+bilateral UE/LE edema Pulses: 2+ and symmetric Skin: Skin color, texture, turgor normal. No rashes or lesions Neurologic: Grossly normal Psych: Pleasant  Inpatient Medications    Scheduled Meds:  acetaminophen  1,000 mg Oral TID   allopurinol  100 mg Oral Daily   apixaban  5 mg Oral BID   atorvastatin  20 mg Oral Daily   cephALEXin  250 mg Oral Q12H   Chlorhexidine Gluconate Cloth  6 each Topical Daily   cholecalciferol  2,000 Units Oral Daily   [START ON  02/14/2023] cyanocobalamin  1,000 mcg Intramuscular Q30 days   fluconazole  100 mg Oral Daily   furosemide  40 mg Intravenous BID   insulin aspart  0-5 Units Subcutaneous QHS   insulin aspart  0-9 Units Subcutaneous TID WC   lidocaine  2 patch Transdermal Daily   metoprolol tartrate  25 mg Oral BID   saccharomyces boulardii  250 mg Oral BID   tamsulosin  0.4 mg Oral QPC supper    Continuous Infusions:    PRN Meds: magnesium hydroxide, ondansetron **OR** ondansetron (ZOFRAN) IV, traMADol   Labs   Results for orders placed or performed during the hospital encounter of 01/30/23 (from the past 48 hour(s))  Glucose, capillary     Status: Abnormal   Collection Time: 02/01/23 11:03 AM  Result Value Ref Range   Glucose-Capillary 146 (H) 70 - 99 mg/dL    Comment: Glucose reference range applies only to samples taken after fasting for at least 8 hours.  Blood gas, arterial     Status: Abnormal   Collection Time: 02/01/23  1:50 PM  Result Value Ref Range   pH, Arterial 7.44 7.35 - 7.45   pCO2 arterial 53 (H) 32 - 48 mmHg   pO2, Arterial 76 (L) 83 - 108 mmHg   Bicarbonate 36.0 (H) 20.0 - 28.0 mmol/L  Acid-Base Excess 10.4 (H) 0.0 - 2.0 mmol/L   O2 Saturation 98.1 %   Patient temperature 37.1    Collection site LEFT RADIAL    Drawn by HGW    Allens test (pass/fail) PASS PASS    Comment: Performed at [facility], [address], [place], [place] [address]  Glucose, capillary     Status: None   Collection Time: 02/01/23  3:53 PM  Result Value Ref Range   Glucose-Capillary 89 70 - 99 mg/dL    Comment: Glucose reference range applies only to samples taken after fasting for at least 8 hours.  Glucose, capillary     Status: None   Collection Time: 02/01/23  9:34 PM  Result Value Ref Range   Glucose-Capillary 88 70 - 99 mg/dL    Comment: Glucose reference range applies only to samples taken after fasting for at least 8 hours.  Glucose, capillary     Status: None   Collection  Time: 02/02/23  5:04 AM  Result Value Ref Range   Glucose-Capillary 72 70 - 99 mg/dL    Comment: Glucose reference range applies only to samples taken after fasting for at least 8 hours.  Basic metabolic panel     Status: Abnormal   Collection Time: 02/02/23  8:39 AM  Result Value Ref Range   Sodium 142 135 - 145 mmol/L   Potassium 3.1 (L) 3.5 - 5.1 mmol/L   Chloride 102 98 - 111 mmol/L   CO2 34 (H) 22 - 32 mmol/L   Glucose, Bld 113 (H) 70 - 99 mg/dL    Comment: Glucose reference range applies only to samples taken after fasting for at least 8 hours.   BUN 34 (H) 8 - 23 mg/dL   Creatinine, Ser 6.30 (H) 0.61 - 1.24 mg/dL   Calcium 8.8 (L) 8.9 - 10.3 mg/dL   GFR, Estimated 37 (L) >60 mL/min    Comment: (NOTE) Calculated using the CKD-EPI Creatinine Equation (2021)    Anion gap 6 5 - 15    Comment: Performed at [facility], [address] [place], [place] [address]  Magnesium     Status: None   Collection Time: 02/02/23  8:39 AM  Result Value Ref Range   Magnesium 1.7 1.7 - 2.4 mg/dL    Comment: Performed at [facility], [address], [place], [place] [address]  Glucose, capillary     Status: Abnormal   Collection Time: 02/02/23 11:18 AM  Result Value Ref Range   Glucose-Capillary 145 (H) 70 - 99 mg/dL    Comment: Glucose reference range applies only to samples taken after fasting for at least 8 hours.  Glucose, capillary     Status: Abnormal   Collection Time: 02/02/23  4:36 PM  Result Value Ref Range   Glucose-Capillary 109 (H) 70 - 99 mg/dL    Comment: Glucose reference range applies only to samples taken after fasting for at least 8 hours.  Glucose, capillary     Status: Abnormal   Collection Time: 02/02/23  9:56 PM  Result Value Ref Range   Glucose-Capillary 126 (H) 70 - 99 mg/dL    Comment: Glucose reference range applies only to samples taken after fasting for at least 8 hours.  Glucose, capillary     Status: Abnormal   Collection Time: 02/03/23  5:51  AM  Result Value Ref Range   Glucose-Capillary 101 (H) 70 - 99 mg/dL    Comment: Glucose reference range applies  only to samples taken after fasting for at least 8 hours.    ECG   N/A - Personally Reviewed  Telemetry   Afib - Personally Reviewed  Radiology    No results found.  Cardiac Studies   N/a  Assessment   Active Problems:   Essential hypertension   Gout   Atrial fibrillation, chronic (HCC)   Sepsis secondary to UTI (HCC)   Acute on chronic diastolic CHF (congestive heart failure) (HCC)   Type 2 diabetes mellitus with hyperlipidemia (HCC)   BPH (benign prostatic hyperplasia)   Pressure injury of skin   Class 2 obesity   Acute kidney injury superimposed on chronic kidney disease (HCC)   Plan   Improving with diuresis - labs pending today. May need more potassium and magnesium (was 1.7 yesterday). Continue BID lasix and one more dose of albumin today.  Time Spent Directly with Patient:  I have spent a total of 25 minutes with the patient reviewing hospital notes, telemetry, EKGs, labs and examining the patient as well as establishing an assessment and plan that was discussed personally with the patient.  > 50% of time was spent in direct patient care.  Length of Stay:  LOS: 3 days   Chrystie Nose, MD, [facility], FACP  [place]  [facility]  Medical Director of the [facility] Lipid [facility] &  Cardiovascular Risk Reduction Clinic Diplomate of the American Board of Clinical Lipidology Attending Cardiologist  Direct Dial: [phone]  Fax: [phone]  Website:  [email or web address]  Lisette Abu Esmee Fallaw 02/03/2023, 9:20 AM

## 2023-02-04 ENCOUNTER — Other Ambulatory Visit: Payer: Self-pay

## 2023-02-04 DIAGNOSIS — N179 Acute kidney failure, unspecified: Secondary | ICD-10-CM | POA: Diagnosis not present

## 2023-02-04 DIAGNOSIS — E1169 Type 2 diabetes mellitus with other specified complication: Secondary | ICD-10-CM

## 2023-02-04 DIAGNOSIS — I10 Essential (primary) hypertension: Secondary | ICD-10-CM | POA: Diagnosis not present

## 2023-02-04 DIAGNOSIS — I5033 Acute on chronic diastolic (congestive) heart failure: Secondary | ICD-10-CM | POA: Diagnosis not present

## 2023-02-04 DIAGNOSIS — I482 Chronic atrial fibrillation, unspecified: Secondary | ICD-10-CM | POA: Diagnosis not present

## 2023-02-04 DIAGNOSIS — N189 Chronic kidney disease, unspecified: Secondary | ICD-10-CM | POA: Diagnosis not present

## 2023-02-04 LAB — BASIC METABOLIC PANEL
Anion gap: 8 (ref 5–15)
BUN: 30 mg/dL — ABNORMAL HIGH (ref 8–23)
CO2: 36 mmol/L — ABNORMAL HIGH (ref 22–32)
Calcium: 9 mg/dL (ref 8.9–10.3)
Chloride: 96 mmol/L — ABNORMAL LOW (ref 98–111)
Creatinine, Ser: 1.54 mg/dL — ABNORMAL HIGH (ref 0.61–1.24)
GFR, Estimated: 46 mL/min — ABNORMAL LOW (ref >60)
Glucose, Bld: 111 mg/dL — ABNORMAL HIGH (ref 70–99)
Potassium: 4.1 mmol/L (ref 3.5–5.1)
Sodium: 140 mmol/L (ref 135–145)

## 2023-02-04 LAB — GLUCOSE, CAPILLARY
Glucose-Capillary: 100 mg/dL — ABNORMAL HIGH (ref 70–99)
Glucose-Capillary: 122 mg/dL — ABNORMAL HIGH (ref 70–99)
Glucose-Capillary: 125 mg/dL — ABNORMAL HIGH (ref 70–99)
Glucose-Capillary: 119 mg/dL — ABNORMAL HIGH (ref 70–99)

## 2023-02-04 LAB — MAGNESIUM: Magnesium: 2 mg/dL (ref 1.7–2.4)

## 2023-02-04 LAB — CULTURE, BLOOD (ROUTINE X 2): Culture: NO GROWTH

## 2023-02-04 MED ORDER — BISACODYL 5 MG PO TBEC
10.0000 mg | DELAYED_RELEASE_TABLET | Freq: Once | ORAL | Status: AC
  Administered 2023-02-04: 10 mg via ORAL
  Filled 2023-02-04: qty 2

## 2023-02-04 MED ORDER — GERHARDT'S BUTT CREAM
TOPICAL_CREAM | Freq: Two times a day (BID) | CUTANEOUS | Status: DC
  Administered 2023-02-10: 1 via TOPICAL
  Filled 2023-02-04 (×4): qty 1

## 2023-02-04 MED ORDER — POLYETHYLENE GLYCOL 3350 17 G PO PACK
17.0000 g | PACK | Freq: Two times a day (BID) | ORAL | Status: DC
  Administered 2023-02-04 – 2023-02-15 (×12): 17 g via ORAL
  Filled 2023-02-04 (×16): qty 1

## 2023-02-04 MED ORDER — SPIRONOLACTONE 12.5 MG HALF TABLET
12.5000 mg | ORAL_TABLET | Freq: Every day | ORAL | Status: DC
  Administered 2023-02-04 – 2023-02-15 (×12): 12.5 mg via ORAL
  Filled 2023-02-04 (×12): qty 1

## 2023-02-04 MED ORDER — MEDIHONEY WOUND/BURN DRESSING EX PSTE
1.0000 | PASTE | Freq: Every day | CUTANEOUS | Status: DC
  Administered 2023-02-04 – 2023-02-15 (×12): 1 via TOPICAL
  Filled 2023-02-04: qty 44

## 2023-02-04 MED ORDER — HYDROCERIN EX CREA
TOPICAL_CREAM | Freq: Two times a day (BID) | CUTANEOUS | Status: DC
  Administered 2023-02-11 – 2023-02-14 (×2): 1 via TOPICAL
  Filled 2023-02-04 (×2): qty 113

## 2023-02-04 MED ORDER — GABAPENTIN 100 MG PO CAPS
100.0000 mg | ORAL_CAPSULE | Freq: Three times a day (TID) | ORAL | Status: DC
  Administered 2023-02-04 – 2023-02-05 (×2): 100 mg via ORAL
  Filled 2023-02-04 (×2): qty 1

## 2023-02-04 NOTE — Progress Notes (Signed)
   02/04/23 2055  BiPAP/CPAP/SIPAP  BiPAP/CPAP/SIPAP Pt Type Adult  BiPAP/CPAP/SIPAP Resmed  Mask Type Full face mask  Mask Size Medium  Respiratory Rate 16 breaths/min  EPAP  (min 8 Yuvin 18)  FiO2 (%) 32 %  Flow Rate 3 lpm  Patient Home Equipment No  Auto Titrate Yes

## 2023-02-04 NOTE — Consult Note (Addendum)
WOC Nurse Consult Note: patient familiar to WOC, seen 01/31/2023  Reason for Consult: sacral and back pressure injuries  Wound type: Pressure  Pressure Injury POA: Yes Measurement: 1.  R buttock 3 cm x 3 cm x 0.2 cm chronic Stage 3 50% yellow slough, 50% pink moist  2.  No pressure injury found on sacrum or back 3.  Chronic changes to bilateral lower extremities, dry scaly skin with dark hemosiderin staining   Drainage (amount, consistency, odor) minimal tan  Periwound: erythematous  Dressing procedure/placement/frequency: 1.  Will change to Medihoney.  Cleanse R buttock wound with NS, apply Medihoney to wound bed daily, cover with dry gauze and secure with silicone foam. May lift foam daily to reapply Medihoney.  Change foam dressing q3 days and prn soiling.  2.  Clean bilateral lower legs and feet with soap and water, dry and apply Eucerin twice daily.  DO NOT PLACE EUCERIN BETWEEN TOES.    Will add Gerhardts butt cream for previously noted MASD to buttocks and lower back.    POC discussed with patient and bedside nurse.  WOC team will not follow. Re-consult if further needs arise.   Thank you,    Priscella Mann MSN, RN-BC, [employer] [phone]

## 2023-02-04 NOTE — Evaluation (Signed)
Physical Therapy Evaluation Patient Details Name: [name] [name] MRN: [id number] DOB: 31-May-1946 Today's Date: 02/04/2023  History of Present Illness  Pt is 77 year old presented to [facility] via EMS on 01/30/23 from [facility] with AMS, hypoxia, and hematuria in indewelling urinary catheter. Pt c/o back pain from injury a couple of weeks ago. Afib 80-90, RR 24, ETCO2 47, CBG 163. Sp02 on 8L 95%, normally 95% on 4L however found by EMS on no oxygen on arrival to scene. Hypotensive on arrival. Admitted for treatment of acute on chronic diastolic CHF and AKI on cKD PMH - T12-L1 fx with ORIF 11/23/22, morbid obesity, OSA, OHS, DM2, HTN, HLD, COPD, severe pulmonary hypertension, chronic diastolic and right-sided CHF, chronic 3 to 5 L oxygen requirement, permanent A-fib on Eliquis, migraine, arthritis, gout  Clinical Impression  PTA pt in SNF rehabbing from prior hospitalization. Pt poor historian but daughter is in room and reports that pt was walking with RW a week ago. Pt is limited in safe mobility by increased O2 demand in presence of increased back pain with movement and decreased strength. Pt is total A for bed mobility. Patient will benefit from continued inpatient follow up therapy, <3 hours/day. PT will continue to follow acutely.        Recommendations for follow up therapy are one component of a multi-disciplinary discharge planning process, led by the attending physician.  Recommendations may be updated based on patient status, additional functional criteria and insurance authorization.  Follow Up Recommendations Can patient physically be transported by private vehicle: No     Assistance Recommended at Discharge Frequent or constant Supervision/Assistance  Patient can return home with the following  A lot of help with walking and/or transfers;A lot of help with bathing/dressing/bathroom;Assist for transportation    Equipment Recommendations None recommended by PT     Functional Status  Assessment Patient has had a recent decline in their functional status and demonstrates the ability to make significant improvements in function in a reasonable and predictable amount of time.     Precautions / Restrictions Precautions Precautions: Fall;Back Precaution Comments: limited by back pain,T12-L1 fx with ORIF 11/23/22, watch O2 SATs, 3-5L O2 required Restrictions Weight Bearing Restrictions: No      Mobility  Bed Mobility Overal bed mobility: Needs Assistance Bed Mobility: Rolling, Sidelying to Sit, Sit to Sidelying Rolling: Jashan assist, +2 for physical assistance Sidelying to sit: Total assist, +2 for physical assistance   Sit to supine: Sameul assist Sit to sidelying: Total assist, +2 for physical assistance General bed mobility comments: Amil A to roll for LE management and trunk support with hand over hand to reach bed rail. B LE assisted off EOB and trunk support provided for sidelying to sitting, increased time provided    Transfers Overall transfer level: Needs assistance Equipment used: Rolling walker (2 wheels) Transfers: Sit to/from Stand, Bed to chair/wheelchair/BSC Sit to Stand: From elevated surface, Mod assist   Step pivot transfers: Mod assist, From elevated surface       General transfer comment: unable to attempt due to back pain, pt stating, "I can't sit up, I need to lay back down"    Ambulation/Gait Ambulation/Gait assistance: Min assist   Assistive device: Rolling walker (2 wheels) Gait Pattern/deviations: Step-to pattern, Decreased stride length, Drifts right/left, Trunk flexed, Wide base of support Gait velocity: decreased     General Gait Details: brief ambulation trial to the chair with minA for balance and sequencing with cues for proper technique. Cued  for upright posture and forward gaze, increasing B foot clearance with mobility, increased time to complete due to decreased gait speed      Balance Overall balance assessment: Needs  assistance Sitting-balance support: Feet supported, Bilateral upper extremity supported Sitting balance-[name] Scale: Poor Sitting balance - Comments: requires increaeed time, back pain   Standing balance support: Bilateral upper extremity supported, During functional activity, Reliant on assistive device for balance Standing balance-[name] Scale: Poor Standing balance comment: Reliant on RW                             Pertinent Vitals/Pain Pain Assessment Pain Assessment: Faces Pain Score: 3  Faces Pain Scale: Hurts little more Breathing: normal Negative Vocalization: none Facial Expression: smiling or inexpressive Body Language: relaxed Consolability: no need to console PAINAD Score: 0 Pain Location: back, noted pt moaning and reporting increased back pain once seated EOB, however continued to rate 3/10 Pain Descriptors / Indicators: Grimacing, Guarding, Moaning, Discomfort Pain Intervention(s): Limited activity within patient's tolerance, Monitored during session, Repositioned    Home Living Family/patient expects to be discharged to:: Skilled nursing facility Living Arrangements: Children Available Help at Discharge: Family;Available 24 hours/day Type of Home: House Home Access: Stairs to enter Entrance Stairs-Rails: Right;Left;Can reach both Entrance Stairs-Number of Steps: 6   Home Layout: One level Home Equipment: Agricultural consultant (2 wheels);BSC/3in1;Wheelchair - manual;Hospital bed Additional Comments: pt was at a SNF participating in short term rehab following T12-L1 fx with ORIF 11/23/22    Prior Function Prior Level of Function : Needs assist       Physical Assist : Mobility (physical);ADLs (physical) Mobility (physical): Bed mobility;Transfers;Gait   Mobility Comments: pt's daughter reports that pt was walking minimally with a RW at SNF with rehab staff ADLs Comments: assist with ADLs     Hand Dominance   Dominant Hand: Left    Extremity/Trunk  Assessment   Upper Extremity Assessment Upper Extremity Assessment: Defer to OT evaluation    Lower Extremity Assessment Lower Extremity Assessment: Generalized weakness    Cervical / Trunk Assessment Cervical / Trunk Assessment: Other exceptions Cervical / Trunk Exceptions: obese, back pain,T12-L1 fx with ORIF 11/23/22  Communication   Communication: No difficulties  Cognition Arousal/Alertness: Awake/alert Behavior During Therapy: Flat affect Overall Cognitive Status: History of cognitive impairments - at baseline                                 General Comments: pt's daughter reports pt has dementia. pt could not recall when he last walked; daughter reports that he wa swalking last week at SNF with rehab staff        General Comments General comments (skin integrity, edema, etc.): SpO2 on 3L O2 in supine HoB elevated >90%, requires 5L O2 via [place] to maintain SpO2 >90%O2 lying on his R side        Assessment/Plan    PT Assessment Patient needs continued PT services  PT Problem List Decreased strength;Decreased activity tolerance;Decreased balance;Decreased mobility;Pain       PT Treatment Interventions DME instruction;Gait training;Therapeutic activities;Functional mobility training;Therapeutic exercise;Balance training;Patient/family education    PT Goals (Current goals can be found in the Care Plan section)  Acute Rehab PT Goals Patient Stated Goal: Return home PT Goal Formulation: With patient Time For Goal Achievement: 01/28/23 Potential to Achieve Goals: Fair    Frequency Min 1X/week  Co-evaluation PT/OT/SLP Co-Evaluation/Treatment: Yes Reason for Co-Treatment: Complexity of the patient's impairments (multi-system involvement);For patient/therapist safety;To address functional/ADL transfers PT goals addressed during session: Mobility/safety with mobility;Proper use of DME OT goals addressed during session: ADL's and self-care       AM-PAC  PT "6 Clicks" Mobility  Outcome Measure Help needed turning from your back to your side while in a flat bed without using bedrails?: A Lot Help needed moving from lying on your back to sitting on the side of a flat bed without using bedrails?: A Lot Help needed moving to and from a bed to a chair (including a wheelchair)?: A Lot Help needed standing up from a chair using your arms (e.g., wheelchair or bedside chair)?: A Lot Help needed to walk in hospital room?: A Lot Help needed climbing 3-5 steps with a railing? : Total 6 Click Score: 11    End of Session Equipment Utilized During Treatment: Oxygen Activity Tolerance: Patient limited by pain Patient left: with call bell/phone within reach;in bed;with bed alarm set;with family/visitor present Nurse Communication: Mobility status PT Visit Diagnosis: Other abnormalities of gait and mobility (R26.89);History of falling (Z91.81);Muscle weakness (generalized) (M62.81);Pain Pain - part of body:  (back)    Time: 1610-9604 PT Time Calculation (min) (ACUTE ONLY): 25 min   Charges:   PT Evaluation $PT Eval Moderate Complexity: 1 Mod          Jaisa Defino B. Beverely Risen PT, DPT Acute Rehabilitation Services Please use secure chat or  Call Office [phone]   Elon Alas [facility] 02/04/2023, 2:08 PM

## 2023-02-04 NOTE — Progress Notes (Signed)
   Patient Name: [name] [name] Date of Encounter: 02/04/2023 [facility] Cardiologist: Olga Millers, MD   Interval Summary  .    Mr. Trisler has had remarkable clinical improvement since I last saw him 2 days ago. This morning, he is alert and conversational. Denies acute symptoms including chest pain, shortness of breath, palpitations.   Vital Signs .    Vitals:   02/03/23 2238 02/04/23 0026 02/04/23 0500 02/04/23 0720  BP:  108/67 128/76 123/84  Pulse: 70 71 81 90  Resp: 18 18 19 17   Temp:  97.7 F (36.5 C) (!) 97 F (36.1 C) (!) 97.4 F (36.3 C)  TempSrc:  Axillary Axillary Axillary  SpO2: 98% 98% 100% 100%  Weight:   109.6 kg   Height:        Intake/Output Summary (Last 24 hours) at 02/04/2023 0853 Last data filed at 02/04/2023 0029 Gross per 24 hour  Intake 240 ml  Output 4200 ml  Net -3960 ml      02/04/2023    5:00 AM 02/03/2023    5:00 AM 02/02/2023    4:57 AM  Last 3 Weights  Weight (lbs) 241 lb 10 oz 238 lb 15.7 oz 242 lb 15.2 oz  Weight (kg) 109.6 kg 108.4 kg 110.2 kg      Telemetry/ECG    Atrial fibrillation with ventricular rates 60s-80s - Personally Reviewed  Physical Exam .   GEN: No acute distress.   Neck: No JVD Cardiac: irregularly irregular, no murmurs, rubs, or gallops.  Respiratory: Clear to auscultation bilaterally. GI: Soft, nontender, non-distended  MS: Trace lower extremity edema bilaterally.   Assessment & Plan .     Acute on chronic diastolic CHF Acute respiratory failure, hypoxia with hypercarbia Hypoalbuminemia  Patient presented to the ED on 6/24 from SNF with AMS, hypoxia, hematuria. He was initially hypotensive and was placed on BiPAP for respiratory distress. BNP elevated to 230 and CXR consistent with CHF. Last echo from April 2024 showed LVEF 60-65% with mild LVH, normal RV function, moderate biatrial enlargement. Suspect patient had accumulation of volume after recent discharge following sepsis admission off diuretics.    With albumin and BID lasix, patient now net negative 10.1L this admission. Weights are not accurately tracked.  Creatinine remains stable along with BUN. Patient now appears to be near euvolemic on physical exam. Would continue IV lasix 40mg  BID today and consider transition to PO tomorrow.  Add low dose spironolactone, 12.5mg  Avoid SGLT2 with UTI risk  Permanent atrial fibrillation  Telemetry shows patient with stable afib, ventricular rates 60s-80s.   Continue Metoprolol Tartrate 25mg  BID Continue Eliquis 5mg  BID   Dyslipidemia  Continue Atorvastatin 20mg .  OSA  Continue use of BiPAP.  AKI on CKD stage IIIa  Creatinine stable with diuresis: 1.84->1.71->1.54. Continue to follow closely.  Per primary team: Acute metabolic encephalopathy BPH DM type II Gout Pressure ulcers  For questions or updates, please contact [facility] Please consult [email or web address] for contact info under        Signed, Perlie Gold, PA-C

## 2023-02-04 NOTE — Plan of Care (Signed)
  Problem: Education: Goal: Ability to demonstrate management of disease process will improve Outcome: Progressing Goal: Ability to verbalize understanding of medication therapies will improve Outcome: Progressing Goal: Individualized Educational Video(s) Outcome: Progressing   Problem: Activity: Goal: Capacity to carry out activities will improve Outcome: Progressing   Problem: Cardiac: Goal: Ability to achieve and maintain adequate cardiopulmonary perfusion will improve Outcome: Progressing   Problem: Coping: Goal: Ability to adjust to condition or change in health will improve Outcome: Progressing   Problem: Fluid Volume: Goal: Ability to maintain a balanced intake and output will improve Outcome: Progressing   Problem: Health Behavior/Discharge Planning: Goal: Ability to identify and utilize available resources and services will improve Outcome: Progressing Goal: Ability to manage health-related needs will improve Outcome: Progressing   Problem: Metabolic: Goal: Ability to maintain appropriate glucose levels will improve Outcome: Progressing   Problem: Nutritional: Goal: Maintenance of adequate nutrition will improve Outcome: Progressing Goal: Progress toward achieving an optimal weight will improve Outcome: Progressing

## 2023-02-04 NOTE — [place] FL2 (Addendum)
[place] MEDICAID FL2 LEVEL OF CARE FORM     IDENTIFICATION  Patient Name: [name] [name] Birthdate: [birth year] Sex: male Admission Date (Current Location): 01/30/2023  [facility] and [place] Number:  Producer, television/film/video and Address:  [facility] [place], [address], [place], [place] [address]      Provider Number: [id number]  Attending Physician Name and Address:  Coralie Keens,*  Relative Name and Phone Number:  [name] [phone]    Current Level of Care: Hospital Recommended Level of Care: Skilled Nursing Facility Prior Approval Number:    Date Approved/Denied:   PASRR Number: [id number]  Discharge Plan: SNF    Current Diagnoses: Patient Active Problem List   Diagnosis Date Noted   Pressure injury of skin 02/02/2023   Class 2 obesity 02/02/2023   Acute kidney injury superimposed on chronic kidney disease (HCC) 02/02/2023   Acute on chronic diastolic CHF (congestive heart failure) (HCC) 01/31/2023   Acute respiratory failure with hypoxia and hypercarbia (HCC) 01/31/2023   Dyslipidemia 01/31/2023   Type 2 diabetes mellitus with hyperlipidemia (HCC) 01/31/2023   BPH (benign prostatic hyperplasia) 01/31/2023   Acute respiratory failure with hypoxia and hypercapnia (HCC) 01/31/2023   Acute on chronic respiratory failure with hypoxia and hypercapnia (HCC) 01/31/2023   Acute pulmonary edema (HCC) 01/31/2023   Bacteremia 01/15/2023   Complicated UTI (urinary tract infection) 01/13/2023   Sepsis secondary to UTI (HCC) 01/13/2023   Closed fracture of eleventh thoracic vertebra (HCC) 11/23/2022   Closed L2 vertebral fracture (HCC) 11/18/2022   Atrial fibrillation, chronic (HCC) 11/18/2022   L1 vertebral fracture (HCC) 11/18/2022   Subacute cough 10/10/2022   Fatigue 10/08/2022   Chronic respiratory failure with hypoxia and hypercapnia related to obesity hypoventilation syndrome 03/19/2022   Chronic diastolic CHF (congestive heart failure) (HCC)  03/19/2022   Actinic keratosis 11/27/2021   Sebaceous cyst 11/27/2021   Urinary frequency 10/17/2021   Left arm swelling 01/18/2021   Vitamin D deficiency 10/10/2020   B12 deficiency 10/10/2020   Lower extremity weakness 09/12/2020   Lesion of right ear 04/13/2020   Arthritis of right acromioclavicular joint 04/02/2020   Right rotator cuff tear 04/02/2020   Greater trochanteric bursitis, right 08/14/2019   Tick bite, infected 12/19/2018   Degenerative arthritis of right knee 02/21/2017   Right knee pain 02/15/2017   Volume overload 12/23/2015   OSA (obstructive sleep apnea) 12/23/2015   Pulmonary hypertension (HCC) 12/23/2015   Gout 12/23/2015   Allergic rhinitis, cause unspecified 11/21/2013   DOE (dyspnea on exertion) 12/20/2012   Left knee pain 07/11/2012   Increased prostate specific antigen (PSA) velocity 01/06/2012   Encounter for well adult exam with abnormal findings 12/25/2010   Long term (current) use of anticoagulants 11/06/2010   HEMATOCHEZIA 06/26/2010   BRONCHITIS, CHRONIC 01/02/2009   Obesity hypoventilation syndrome (HCC) 11/26/2008   Permanent atrial fibrillation 10/16/2008   PERIPHERAL EDEMA 01/05/2008   Non-insulin dependent type 2 diabetes mellitus (HCC) 06/09/2007   COMMON MIGRAINE 06/09/2007   Unspecified hearing loss 06/09/2007   PROSTATE SPECIFIC ANTIGEN, ELEVATED 06/09/2007   Hyperlipidemia 02/18/2007   Severe obesity (BMI >= 40) (HCC) 02/18/2007   Essential hypertension 02/18/2007    Orientation RESPIRATION BLADDER Height & Weight     Self, Situation, Place  O2 (3 Liters) Incontinent, External catheter Weight: 241 lb 10 oz (109.6 kg) Height:  5\' 8"  (172.7 cm)  BEHAVIORAL SYMPTOMS/MOOD NEUROLOGICAL BOWEL NUTRITION STATUS      Incontinent Diet (See dc summary)  AMBULATORY  STATUS COMMUNICATION OF NEEDS Skin   Extensive Assist Verbally Normal                       Personal Care Assistance Level of Assistance  Bathing, Feeding, Dressing  Bathing Assistance: Maximum assistance Feeding assistance: Independent Dressing Assistance: Maximum assistance     Functional Limitations Info  Sight, Hearing, Speech Sight Info: Impaired Hearing Info: Impaired Speech Info: Adequate    SPECIAL CARE FACTORS FREQUENCY  PT (By licensed PT), OT (By licensed OT)     PT Frequency: 5xweek OT Frequency: 5xweek            Contractures Contractures Info: Not present    Additional Factors Info  Code Status, Allergies Code Status Info: Full Allergies Info: Codeine  Heparin   Insulin Sliding Scale Info: See dc summary       Current Medications (02/04/2023):  This is the current hospital active medication list Current Facility-Administered Medications  Medication Dose Route Frequency Provider Last Rate Last Admin   acetaminophen (TYLENOL) tablet 1,000 mg  1,000 mg Oral TID Excell Seltzer, Josseline P, PA-C   1,000 mg at 02/04/23 1347   allopurinol (ZYLOPRIM) tablet 100 mg  100 mg Oral Daily Mansy, Jan A, MD   100 mg at 02/04/23 1610   apixaban (ELIQUIS) tablet 5 mg  5 mg Oral BID Mansy, Jan A, MD   5 mg at 02/04/23 0825   atorvastatin (LIPITOR) tablet 20 mg  20 mg Oral Daily Mansy, Jan A, MD   20 mg at 02/04/23 0825   cephALEXin (KEFLEX) capsule 250 mg  250 mg Oral Q12H Arrien, York Ram, MD   250 mg at 02/04/23 0825   Chlorhexidine Gluconate Cloth 2 % PADS 6 each  6 each Topical Daily Lanae Boast, MD   6 each at 02/04/23 0828   cholecalciferol (VITAMIN D3) 25 MCG (1000 UNIT) tablet 2,000 Units  2,000 Units Oral Daily Mansy, Jan A, MD   2,000 Units at 02/04/23 0825   [START ON 02/14/2023] cyanocobalamin (VITAMIN B12) injection 1,000 mcg  1,000 mcg Intramuscular Q30 days Mansy, Jan A, MD       furosemide (LASIX) injection 40 mg  40 mg Intravenous BID Chrystie Nose, MD   40 mg at 02/04/23 0825   Gerhardt's butt cream   Topical BID Arrien, York Ram, MD   Given at 02/04/23 1101   hydrocerin (EUCERIN) cream   Topical BID Coralie Keens, MD   Given at 02/04/23 1059   insulin aspart (novoLOG) injection 0-5 Units  0-5 Units Subcutaneous QHS Mansy, Jan A, MD   3 Units at 01/31/23 2134   insulin aspart (novoLOG) injection 0-9 Units  0-9 Units Subcutaneous TID WC Mansy, Vernetta Honey, MD   1 Units at 02/02/23 1130   leptospermum manuka honey (MEDIHONEY) paste 1 Application  1 Application Topical Daily Coralie Keens, MD   1 Application at 02/04/23 1059   lidocaine (LIDODERM) 5 % 2 patch  2 patch Transdermal Daily Carollee Herter, DO   2 patch at 02/03/23 2202   magnesium hydroxide (MILK OF MAGNESIA) suspension 30 mL  30 mL Oral Daily PRN Mansy, Jan A, MD       metoprolol tartrate (LOPRESSOR) tablet 25 mg  25 mg Oral BID Mansy, Jan A, MD   25 mg at 02/04/23 0825   ondansetron (ZOFRAN) tablet 4 mg  4 mg Oral Q6H PRN Mansy, Vernetta Honey, MD       Or  ondansetron (ZOFRAN) injection 4 mg  4 mg Intravenous Q6H PRN Mansy, Jan A, MD       polyethylene glycol (MIRALAX / GLYCOLAX) packet 17 g  17 g Oral BID Arrien, York Ram, MD   17 g at 02/04/23 1009   saccharomyces boulardii (FLORASTOR) capsule 250 mg  250 mg Oral BID Mansy, Jan A, MD   250 mg at 02/04/23 0825   spironolactone (ALDACTONE) tablet 12.5 mg  12.5 mg Oral Daily Perlie Gold, PA-C   12.5 mg at 02/04/23 1009   tamsulosin (FLOMAX) capsule 0.4 mg  0.4 mg Oral QPC supper Mansy, Jan A, MD   0.4 mg at 02/03/23 1721   traMADol (ULTRAM) tablet 50 mg  50 mg Oral Q6H PRN Arrien, York Ram, MD   50 mg at 02/04/23 1040     Discharge Medications: Please see discharge summary for a list of discharge medications.  Relevant Imaging Results:  Relevant Lab Results:   Additional Information SSN: [id number]  Oletta Lamas, MSW, [facility] Transitions [facility]  Clinical Social Worker I

## 2023-02-04 NOTE — Evaluation (Signed)
Occupational Therapy Evaluation Patient Details Name: [name] [name] MRN: [id number] DOB: 1945/10/30 Today's Date: 02/04/2023   History of Present Illness Pt is 77 year old presented [facility] via EMS on 01/30/23 from [facility] SNF with AMS, hypoxia, and hematuria in indewelling urinary catheter. Pt c/o back pain from injury a couple of weeks ago. Afib 80-90, RR 24, ETCO2 47, CBG 163. Sp02 on 8L 95%, normally 95% on 4L however found by EMS on no oxygen on arrival to scene. Hypotensive on arrival. Admitted for treatment of acute on chronic diastolic CHF and AKI on cKD PMH - T12-L1 fx with ORIF 11/23/22, morbid obesity, OSA, OHS, DM2, HTN, HLD, COPD, severe pulmonary hypertension, chronic diastolic and right-sided CHF, chronic 3 to 5 L oxygen requirement, permanent A-fib on Eliquis, migraine, arthritis, gout   Clinical Impression   Pt presents with decline in function and safety with ADLs and ADL mobility. Pt reports 3/10 back ain at rest and on 3L O2. PTA pt was at a SNF participating in short term rehab and was previously hospitalized following T12-L1 fx with ORIF 11/23/22, d/c home, re admitted to hospital and d/c to another SNF for short term rehab which is where pt was admitted from this hospitalization. Pt's daughter reports that he required assist with ADLs/selfcare and walking minimally with RW at SNF. Pt currently requires Natalio A +2 for bed mobility, total A to sit EOB, Nezar A with UB ADLs, total A with LB ADLs and toileting; pt unable to attempt standing and was limited by back pain. Pt noted pt moaning and reporting increased back pain once seated EOB, however continued to rate 3/10. Pt returned to supine total A +2 and positioned on R side with pillows. O2 SATs decreased to 84-86%. Increased O2 to 4L, the 4.5L and pt increased to 88% with pt falling asleep, O2 increased to 5L and SATs increased to 92%. Pt and daughter planing for pt to return to a SNF for continued rehab. Pt would benefit from acute OT  services to address impairments to maximize level of function and safety     Recommendations for follow up therapy are one component of a multi-disciplinary discharge planning process, led by the attending physician.  Recommendations may be updated based on patient status, additional functional criteria and insurance authorization.   Assistance Recommended at Discharge Frequent or constant Supervision/Assistance  Patient can return home with the following A lot of help with walking and/or transfers;Two people to help with walking and/or transfers;Direct supervision/assist for medications management;Help with stairs or ramp for entrance    Functional Status Assessment  Patient has had a recent decline in their functional status and demonstrates the ability to make significant improvements in function in a reasonable and predictable amount of time.  Equipment Recommendations  Tub/shower bench;Other (comment)    Recommendations for Other Services       Precautions / Restrictions Precautions Precautions: Fall;Back Precaution Comments: limited by back pain,T12-L1 fx with ORIF 11/23/22, watch O2 SATs, 3-5L O2 required Restrictions Weight Bearing Restrictions: No      Mobility Bed Mobility Overal bed mobility: Needs Assistance Bed Mobility: Rolling, Sidelying to Sit, Sit to Sidelying Rolling: Tee assist, +2 for physical assistance Sidelying to sit: Total assist, +2 for physical assistance     Sit to sidelying: Total assist, +2 for physical assistance General bed mobility comments: Infant A to roll for LE management and trunk support with hand over hand to reach bed rail. B LE assisted off EOB and  trunk support provided for sidelying to sitting, increased time provided    Transfers                   General transfer comment: unable to attempt due to back pain, pt stating, "I can't sit up, I need to lay back down"      Balance Overall balance assessment: Needs  assistance Sitting-balance support: Feet supported, Bilateral upper extremity supported Sitting balance-[name] Scale: Poor Sitting balance - Comments: requires increaeed time, back pain                                   ADL either performed or assessed with clinical judgement   ADL Overall ADL's : Needs assistance/impaired Eating/Feeding: Set up;Independent;Sitting;Bed level   Grooming: Wash/dry hands;Wash/dry face;Minimal assistance;Sitting   Upper Body Bathing: Maximal assistance   Lower Body Bathing: Maximal assistance   Upper Body Dressing : Total assistance   Lower Body Dressing: Total assistance     Toilet Transfer Details (indicate cue type and reason): unable to safely attempt Toileting- Clothing Manipulation and Hygiene: Total assistance;Bed level         General ADL Comments: limited by pain     Vision Ability to See in Adequate Light: 0 Adequate Patient Visual Report: No change from baseline       Perception     Praxis      Pertinent Vitals/Pain Pain Assessment Pain Assessment: 0-10 Pain Score: 3  Pain Location: back, noted pt moaning and reporting increased back pain once seated EOB, however continued to rate 3/10 Pain Descriptors / Indicators: Grimacing, Guarding, Moaning, Discomfort Pain Intervention(s): Limited activity within patient's tolerance, Monitored during session, Premedicated before session, Repositioned     Hand Dominance Left   Extremity/Trunk Assessment Upper Extremity Assessment Upper Extremity Assessment: Generalized weakness   Lower Extremity Assessment Lower Extremity Assessment: Defer to PT evaluation   Cervical / Trunk Assessment Cervical / Trunk Assessment: Other exceptions Cervical / Trunk Exceptions: obese, back pain,T12-L1 fx with ORIF 11/23/22   Communication Communication Communication: No difficulties   Cognition Arousal/Alertness: Awake/alert Behavior During Therapy: Flat affect Overall Cognitive  Status: History of cognitive impairments - at baseline                                 General Comments: pt's dauhgter reports pt has dementia. pt could not recall when he last walked; daughter reports that he wa swalking last week at [facility] with rehab staff     General Comments       Exercises     Shoulder Instructions      Home Living Family/patient expects to be discharged to:: Skilled nursing facility                             Home Equipment: Rolling Walker (2 wheels);BSC/3in1;Wheelchair - manual;Hospital bed   Additional Comments: pt was at a SNF participating in short term rehab following T12-L1 fx with ORIF 11/23/22      Prior Functioning/Environment Prior Level of Function : Needs assist       Physical Assist : Mobility (physical);ADLs (physical) Mobility (physical): Bed mobility;Transfers;Gait   Mobility Comments: pt's daughter reports that pt was walking minimally with a RW at SNF with rehab staff ADLs Comments: assist with ADLs  OT Problem List: Decreased strength;Impaired balance (sitting and/or standing);Decreased activity tolerance;Pain;Obesity;Decreased coordination      OT Treatment/Interventions: Self-care/ADL training;Therapeutic activities;Therapeutic exercise;Energy conservation;DME and/or AE instruction;Balance training;Patient/family education    OT Goals(Current goals can be found in the care plan section) Acute Rehab OT Goals Patient Stated Goal: get better OT Goal Formulation: With patient/family Time For Goal Achievement: 02/18/23 Potential to Achieve Goals: Good  OT Frequency: Min 2X/week    Co-evaluation PT/OT/SLP Co-Evaluation/Treatment: Yes Reason for Co-Treatment: Complexity of the patient's impairments (multi-system involvement);For patient/therapist safety;To address functional/ADL transfers   OT goals addressed during session: ADL's and self-care      AM-PAC OT "6 Clicks" Daily Activity      Outcome Measure Help from another person eating meals?: None Help from another person taking care of personal grooming?: A Little Help from another person toileting, which includes using toliet, bedpan, or urinal?: Total Help from another person bathing (including washing, rinsing, drying)?: A Lot Help from another person to put on and taking off regular upper body clothing?: A Lot Help from another person to put on and taking off regular lower body clothing?: Total 6 Click Score: 13   End of Session    Activity Tolerance: No increased pain Patient left: with call bell/phone within reach;with family/visitor present;in bed  OT Visit Diagnosis: Pain;Muscle weakness (generalized) (M62.81);Other abnormalities of gait and mobility (R26.89) Pain - part of body:  (back)                Time: 2956-2130 OT Time Calculation (min): 29 min Charges:  OT General Charges $OT Visit: 1 Visit OT Evaluation $OT Eval Moderate Complexity: 1 Mod    Galen Manila 02/04/2023, 11:55 AM

## 2023-02-04 NOTE — Progress Notes (Addendum)
Progress Note   Patient: [name] [name] ZOX:[id number] DOB: 1946-01-20 DOA: 01/30/2023     4 DOS: the patient was seen and examined on 02/04/2023   Brief hospital course: [name] [name] was admitted to the hospital with the working diagnosis of heart failure exacerbation.   77 yom w/ diastolic CHF, migraine, T2DM,hypertension, dyslipidemia, GERD, OSA and pulmonary hypertension,presented to ED W/ acute onset of worsening dyspnea with associated orthopnea and paroxysmal nocturnal dyspnea as well as dyspnea on exertion and lower extremity edema. EMS was called at [facility] because patient had AMS, hypoxia and hematuria initially indwelling urinary catheter.  He is complaining of back pain from injury a couple of weeks ago.  For EMS EKG showed A-fib with heart rate 80-90, respiration 24 ETCO2 47 CBG 163 pulse ox 95% on 8 L normally 95% on 4 L, was not on oxygen on arrival.  In the ED patient was hypotensive on arrival, concerning for sepsis. He was c/o dysuria and urinary frequency and urgency. initially 84/40 and later on 91/62, RR 27 and later 19 and pulse 70 was 93% on 6 L O2>was placed on BiPAP for respiratory distress and pulse extremity was 94% on 40% FiO2 on BiPAP.   Labs: Creatinine of 2.03 and glucose of 141,albumin <1.5.Lactic acid was 1.4 and CBC showed anemia close to baseline.  INR was 1.9 PT 21.8 with a PTT of 40.  Influenza antigens, COVID-19 and RSV PCR's came back negative.  UA was positive for UTI.Urine and blood cultures were drawn.  WJX:BJYNWG fibrillation with a rate of 101  Imaging: Portable chest x-ray showed finding consistent with CHF, however BNP not significantly elevated, CT head no acute finding.  Renal ultrasound increased echogenicity ABG suggestive of respiratory acidosis.  In ED given 1.75 L of IV LR, IV vancomycin, Flagyl and cefepime for initial hypotension and suspected sepsis. Patient's blood pressure improved, Dr Marchelle Gearing from critical care was consulted,  repeat blood gas showed improvement in pH  Patient is admitted under [facility] for further management.    06/27 patient responding well to diuresis for volume overload.  06/28 patient with improved volume status, possible transition to oral loop diuretic tomorrow.  Pending transfer to SNF.  Assessment and Plan: Acute on chronic diastolic CHF (congestive heart failure) (HCC) Echocardiogram with preserved LV systolic function with EF 60 to 65%, mild LVH, RV systolic function preserved, LA and RA with moderate dilatation.   Urine output is 4,200 ml Systolic blood pressure systolic blood pressure 92 to 99 mmHg.   06/26 albumin IV  Continue diuresis with furosemide 40 mg IV q12  Continue metoprolol and added spironolactone.  No SGLT 2 inh due to risk of urine infection.   Acute on chronic hypercapnic and hypoxemic respiratory failure Acute metabolic encephalopathy, resolved.  02 saturation is 97 to 96% on 5 L/min per [place].  Supplemental 02 per [place]. (Patient has home 02).    Acute kidney injury superimposed on chronic kidney disease (HCC) CKD stage 3b Hypokalemia,  Renal function today with serum cr at 1,54 with K at 4,1 and serum bicarbonate at 36. Na 140 and Mg 2,0   Possible transition to po loop diuretic in am. Follow up renal function and electrolytes in am.   Essential hypertension Continue blood pressure control with metoprolol.   Atrial fibrillation, chronic (HCC) Continue rate control with metoprolol and anticoagulation with apixaban.   BPH (benign prostatic hyperplasia) Continue with flomax, he has foley catheter in place, he has failed  voiding trials.  Follow up with Urology as outpatient.   Gout continue allopurinol.  Type 2 diabetes mellitus with hyperlipidemia (HCC) Continue insulin sliding scale for glucose cover and monitoring.  Continue with statin therapy   Pressure injury of skin  Active Pressure Injury/Wound(s)     Pressure Ulcer  Duration           Pressure Injury 01/31/23 Buttocks Left Deep Tissue Pressure Injury - Purple or maroon localized area of discolored intact skin or blood-filled blister due to damage of underlying soft tissue from pressure and/or shear. Buttock Blanchable Purple; PT has s 3 days   Pressure Injury 01/31/23 Buttocks Right Stage 3 -  Full thickness tissue loss. Subcutaneous fat may be visible but bone, tendon or muscle are NOT exposed. 3 days   Pressure Injury 01/31/23 Perineum Posterior Unstageable - Full thickness tissue loss in which the base of the injury is covered by slough (yellow, tan, gray, green or brown) and/or eschar (tan, brown or black) in the wound bed. Pressure Wound Posterior S 3 days         Class 2 obesity Calculated BMI is 36,9   Sepsis secondary to UTI (HCC) Urine culture with no growth.  Culture from 01/12/23 with 20,000 CFU klebsiella pneumonia.   Patient has been afebrile and no leukocytosis.  Continue with oral cephalexin total of 3 days.         Subjective: Patient is having back pain, no chest pain or dyspnea, edema has improved.   Physical Exam: Vitals:   02/04/23 0720 02/04/23 1009 02/04/23 1103 02/04/23 1511  BP: 123/84 93/66 96/66  (!) 92/58  Pulse: 90  60 85  Resp: 17  16 14   Temp: (!) 97.4 F (36.3 C)  98.6 F (37 C) 98 F (36.7 C)  TempSrc: Axillary  Oral Oral  SpO2: 100% 98% 96% 94%  Weight:      Height:       Neurology awake and alert ENT with mild pallor Cardiovascular with S1 and S2 present and rhythmic with no gallops, rubs or murmurs Respiratory with no rales or wheezing Abdomen with no distention  Trace lower extremity edema, chronic skin changes/  Data Reviewed:    Family Communication: I spoke with patient's daughter at the bedside, we talked in detail about patient's condition, plan of care and prognosis and all questions were addressed.   Disposition: Status is: Inpatient Remains inpatient appropriate because: pending transfer to SNF   Planned Discharge Destination: Skilled nursing facility     Author: Coralie Keens, MD 02/04/2023 3:34 PM  For on call review [email or web address].

## 2023-02-05 ENCOUNTER — Other Ambulatory Visit: Payer: Self-pay | Admitting: Physician Assistant

## 2023-02-05 DIAGNOSIS — I10 Essential (primary) hypertension: Secondary | ICD-10-CM | POA: Diagnosis not present

## 2023-02-05 DIAGNOSIS — N179 Acute kidney failure, unspecified: Secondary | ICD-10-CM | POA: Diagnosis not present

## 2023-02-05 DIAGNOSIS — I5033 Acute on chronic diastolic (congestive) heart failure: Secondary | ICD-10-CM

## 2023-02-05 DIAGNOSIS — I482 Chronic atrial fibrillation, unspecified: Secondary | ICD-10-CM | POA: Diagnosis not present

## 2023-02-05 DIAGNOSIS — E7849 Other hyperlipidemia: Secondary | ICD-10-CM

## 2023-02-05 DIAGNOSIS — I4821 Permanent atrial fibrillation: Secondary | ICD-10-CM

## 2023-02-05 DIAGNOSIS — J9621 Acute and chronic respiratory failure with hypoxia: Secondary | ICD-10-CM | POA: Diagnosis not present

## 2023-02-05 DIAGNOSIS — N401 Enlarged prostate with lower urinary tract symptoms: Secondary | ICD-10-CM

## 2023-02-05 LAB — GLUCOSE, CAPILLARY
Glucose-Capillary: 100 mg/dL — ABNORMAL HIGH (ref 70–99)
Glucose-Capillary: 153 mg/dL — ABNORMAL HIGH (ref 70–99)
Glucose-Capillary: 128 mg/dL — ABNORMAL HIGH (ref 70–99)
Glucose-Capillary: 156 mg/dL — ABNORMAL HIGH (ref 70–99)

## 2023-02-05 LAB — BASIC METABOLIC PANEL
Anion gap: 8 (ref 5–15)
BUN: 31 mg/dL — ABNORMAL HIGH (ref 8–23)
CO2: 36 mmol/L — ABNORMAL HIGH (ref 22–32)
Calcium: 9.1 mg/dL (ref 8.9–10.3)
Chloride: 94 mmol/L — ABNORMAL LOW (ref 98–111)
Creatinine, Ser: 1.47 mg/dL — ABNORMAL HIGH (ref 0.61–1.24)
GFR, Estimated: 49 mL/min — ABNORMAL LOW (ref >60)
Glucose, Bld: 114 mg/dL — ABNORMAL HIGH (ref 70–99)
Potassium: 3.9 mmol/L (ref 3.5–5.1)
Sodium: 138 mmol/L (ref 135–145)

## 2023-02-05 MED ORDER — GABAPENTIN 100 MG PO CAPS
100.0000 mg | ORAL_CAPSULE | Freq: Every day | ORAL | Status: DC
  Administered 2023-02-05: 100 mg via ORAL
  Filled 2023-02-05: qty 1

## 2023-02-05 MED ORDER — FUROSEMIDE 40 MG PO TABS
40.0000 mg | ORAL_TABLET | Freq: Every day | ORAL | Status: DC
  Administered 2023-02-06 – 2023-02-07 (×2): 40 mg via ORAL
  Filled 2023-02-05 (×2): qty 1

## 2023-02-05 NOTE — Progress Notes (Signed)
AVS updated to reflect instructions for Dr. Antoine Poche request for BMET 7/3 (office closed 7/4) and 1 month follow-up planned. Office will call pt to arrange f/u appt.

## 2023-02-05 NOTE — Progress Notes (Signed)
Progress Note   Patient: [name] [name] UJW:[id number] DOB: [birth year] DOA: 01/30/2023     5 DOS: the patient was seen and examined on 02/05/2023   Brief hospital course: Mr. Lahaye was admitted to the hospital with the working diagnosis of heart failure exacerbation.   77 yom w/ diastolic CHF, migraine, T2DM,hypertension, dyslipidemia, GERD, OSA and pulmonary hypertension,presented to ED W/ acute onset of worsening dyspnea with associated orthopnea and paroxysmal nocturnal dyspnea as well as dyspnea on exertion and lower extremity edema. EMS was called at [facility] because patient had AMS, hypoxia and hematuria initially indwelling urinary catheter.  He is complaining of back pain from injury a couple of weeks ago.  For EMS EKG showed A-fib with heart rate 80-90, respiration 24 ETCO2 47 CBG 163 pulse ox 95% on 8 L normally 95% on 4 L, was not on oxygen on arrival.  In the ED patient was hypotensive on arrival, concerning for sepsis. He was c/o dysuria and urinary frequency and urgency. initially 84/40 and later on 91/62, RR 27 and later 19 and pulse 70 was 93% on 6 L O2>was placed on BiPAP for respiratory distress and pulse extremity was 94% on 40% FiO2 on BiPAP.   Labs: Creatinine of 2.03 and glucose of 141,albumin <1.5.Lactic acid was 1.4 and CBC showed anemia close to baseline.  INR was 1.9 PT 21.8 with a PTT of 40.  Influenza antigens, COVID-19 and RSV PCR's came back negative.  UA was positive for UTI.Urine and blood cultures were drawn.  FAO:ZHYQMV fibrillation with a rate of 101  Imaging: Portable chest x-ray showed finding consistent with CHF, however BNP not significantly elevated, CT head no acute finding.  Renal ultrasound increased echogenicity ABG suggestive of respiratory acidosis.  In ED given 1.75 L of IV LR, IV vancomycin, Flagyl and cefepime for initial hypotension and suspected sepsis. Patient's blood pressure improved, Dr Marchelle Gearing from critical care was consulted,  repeat blood gas showed improvement in pH  Patient is admitted under [facility] for further management.    06/27 patient responding well to diuresis for volume overload.  06/28 patient with improved volume status, possible transition to oral loop diuretic tomorrow.  Pending transfer to SNF. 06/29 patient is medically stable pending transfer to SNF.   Assessment and Plan: Acute on chronic diastolic CHF (congestive heart failure) (HCC) Echocardiogram with preserved LV systolic function with EF 60 to 65%, mild LVH, RV systolic function preserved, LA and RA with moderate dilatation.   Urine output is 2,300 ml Systolic blood pressure systolic blood pressure 102 to 120 mmHg.   06/26 albumin IV   Continue metoprolol ans spironolactone.  Transitioned to oral furosemide 40 mg po daily.  No SGLT 2 inh due to risk of urine infection, urinary retention.   Acute on chronic hypercapnic and hypoxemic respiratory failure Acute metabolic encephalopathy, resolved.  02 saturation is 97 to 96% on 5 L/min per [facility].  Supplemental 02 per [place]. (Patient has home 02).    Acute kidney injury superimposed on chronic kidney disease (HCC) CKD stage 3b Hypokalemia,  Today serum cr is 1,47 with K at 3,9 and serum bicarbonate at 36. Na is 138.   Continue diuretic therapy with furosemide and spironolactone.  Follow up renal function in 48 hrs.   Essential hypertension Continue blood pressure control with metoprolol.   Atrial fibrillation, chronic (HCC) Continue rate control with metoprolol and anticoagulation with apixaban.   BPH (benign prostatic hyperplasia) Continue with flomax, he has foley  catheter in place, he has failed voiding trials.  Follow up with Urology as outpatient.   Gout continue allopurinol.  Chronic back pain continue with as needed tramadol and will decrease gabapentin to 100 mg at bedtime to prevent sedation.   Type 2 diabetes mellitus with hyperlipidemia (HCC) Continue insulin sliding  scale for glucose cover and monitoring.  Continue with statin therapy   Pressure injury of skin  Active Pressure Injury/Wound(s)     Pressure Ulcer  Duration          Pressure Injury 01/31/23 Buttocks Left Deep Tissue Pressure Injury - Purple or maroon localized area of discolored intact skin or blood-filled blister due to damage of underlying soft tissue from pressure and/or shear. Buttock Blanchable Purple; PT has s 3 days   Pressure Injury 01/31/23 Buttocks Right Stage 3 -  Full thickness tissue loss. Subcutaneous fat may be visible but bone, tendon or muscle are NOT exposed. 3 days   Pressure Injury 01/31/23 Perineum Posterior Unstageable - Full thickness tissue loss in which the base of the injury is covered by slough (yellow, tan, gray, green or brown) and/or eschar (tan, brown or black) in the wound bed. Pressure Wound Posterior S 3 days         Class 2 obesity Calculated BMI is 36,9   Sepsis secondary to UTI (HCC) Urine culture with no growth.  Culture from 01/12/23 with 20,000 CFU klebsiella pneumonia.   Patient has been afebrile and no leukocytosis.  Continue with oral cephalexin total of 3 days.         Subjective: Patient with no chest pain or dyspnea, edema has resolved. His back pain has improved. Mild somnolence this morning   Physical Exam: Vitals:   02/05/23 0105 02/05/23 0431 02/05/23 0857 02/05/23 1106  BP: 106/72 117/80 (!) 123/91   Pulse: 73 90 83 94  Resp: 20 19 20 10   Temp: 98.1 F (36.7 C) 97.9 F (36.6 C) 98.1 F (36.7 C)   TempSrc: Oral Oral Oral   SpO2: 91% 91% 96% 92%  Weight:  110.2 kg    Height:       Neurology awake and alert ENT with mild pallor Cardiovascular with S1 and S2 present and rhythmic with no gallops, rubs or murmurs No JVD No lower extremity edema Respiratory with no rales or wheezing, no rhonchi Abdomen with no distention  Data Reviewed:    Family Communication: I spoke with patient's daughter at the bedside, we  talked in detail about patient's condition, plan of care and prognosis and all questions were addressed.   Disposition: Status is: Inpatient Remains inpatient appropriate because: pending return to SNF  Planned Discharge Destination: Skilled nursing facility     Author: Coralie Keens, MD 02/05/2023 11:33 AM  For on call review [email or web address].

## 2023-02-05 NOTE — TOC Progression Note (Addendum)
Transition of Care [facility]) - Progression Note    Patient Details  Name: [name] [name] MRN: [id number] Date of Birth: 1945/08/21  Transition of Care [facility]) CM/SW Contact  Donnalee Curry, LCSWA Phone Number: 02/05/2023, 11:42 AM  Clinical Narrative:     SW met with pt and daughter at bedside. Pt asleep. [name] confirmed she does not want pt to return to [facility]. SW provided current bed offers. [name] requested [facility] to [facility]. [name] will also review current offers.    Expected Discharge Plan: Skilled Nursing Facility Barriers to Discharge: Continued Medical Work up  Expected Discharge Plan and Services In-house Referral: Clinical Social Work Discharge Planning Services:  (CSW) Post Acute Care Choice: Skilled Nursing Facility Living arrangements for the past 2 months: Skilled Nursing Facility                 DME Arranged: N/A DME Agency: NA                   Social Determinants of Health (SDOH) Interventions SDOH Screenings   Food Insecurity: No Food Insecurity (01/31/2023)  Housing: Low Risk  (01/31/2023)  Transportation Needs: No Transportation Needs (01/31/2023)  Utilities: Not At Risk (01/31/2023)  Depression (PHQ2-9): Low Risk  (10/08/2022)  Tobacco Use: Medium Risk (01/31/2023)    Readmission Risk Interventions    01/14/2023    1:35 PM  Readmission Risk Prevention Plan  Transportation Screening Complete  Medication Review (RN Care Manager) Complete  PCP or Specialist appointment within 3-5 days of discharge Complete  HRI or Home Care Consult Complete  Palliative Care Screening Not Applicable  Skilled Nursing Facility Not Applicable

## 2023-02-05 NOTE — Progress Notes (Signed)
   02/05/23 2316  BiPAP/CPAP/SIPAP  BiPAP/CPAP/SIPAP Pt Type Adult  BiPAP/CPAP/SIPAP Resmed  Mask Type Full face mask  Mask Size Medium  Respiratory Rate 14 breaths/min  Pressure Support 6 cmH20  PEEP 5 cmH20  Flow Rate 4 lpm  Patient Home Equipment No  Auto Titrate Yes  BiPAP/CPAP /SiPAP Vitals  Pulse Rate 91  Resp 14  SpO2 99 %  MEWS Score/Color  MEWS Score 0  MEWS Score Color [name]

## 2023-02-05 NOTE — Progress Notes (Signed)
Progress Note  Patient Name: [name] [name] Date of Encounter: 02/05/2023  Primary Cardiologist: Olga Millers, MD  Subjective   No acute events overnight.  SOB significantly improved compared to admission.  Net -12 L urine output since admission.  No symptoms.  Inpatient Medications    Scheduled Meds:  acetaminophen  1,000 mg Oral TID   allopurinol  100 mg Oral Daily   apixaban  5 mg Oral BID   atorvastatin  20 mg Oral Daily   Chlorhexidine Gluconate Cloth  6 each Topical Daily   cholecalciferol  2,000 Units Oral Daily   [START ON 02/14/2023] cyanocobalamin  1,000 mcg Intramuscular Q30 days   furosemide  40 mg Intravenous BID   gabapentin  100 mg Oral TID   Gerhardt's butt cream   Topical BID   hydrocerin   Topical BID   insulin aspart  0-5 Units Subcutaneous QHS   insulin aspart  0-9 Units Subcutaneous TID WC   leptospermum manuka honey  1 Application Topical Daily   lidocaine  2 patch Transdermal Daily   metoprolol tartrate  25 mg Oral BID   polyethylene glycol  17 g Oral BID   saccharomyces boulardii  250 mg Oral BID   spironolactone  12.5 mg Oral Daily   tamsulosin  0.4 mg Oral QPC supper   Continuous Infusions:  PRN Meds: magnesium hydroxide, ondansetron **OR** ondansetron (ZOFRAN) IV, traMADol   Vital Signs    Vitals:   02/04/23 2038 02/05/23 0105 02/05/23 0431 02/05/23 0857  BP: 107/68 106/72 117/80 (!) 123/91  Pulse: 93 73 90 83  Resp: 19 20 19 20   Temp: 98 F (36.7 C) 98.1 F (36.7 C) 97.9 F (36.6 C) 98.1 F (36.7 C)  TempSrc: Oral Oral Oral Oral  SpO2: 94% 91% 91% 96%  Weight:   110.2 kg   Height:        Intake/Output Summary (Last 24 hours) at 02/05/2023 0929 Last data filed at 02/05/2023 1610 Gross per 24 hour  Intake 460 ml  Output 3000 ml  Net -2540 ml   Filed Weights   02/03/23 0500 02/04/23 0500 02/05/23 0431  Weight: 108.4 kg 109.6 kg 110.2 kg    Telemetry     Personally reviewed, A-fib with PVCs.  ECG    Not performed  today  Physical Exam   GEN: No acute distress.   Neck: No JVD. Cardiac: RRR, no murmur, rub, or gallop.  Respiratory: Nonlabored. Clear to auscultation bilaterally. GI: Soft, nontender, bowel sounds present. MS: Trace to 1+ edema; No deformity. Neuro:  Nonfocal. Psych: Alert and oriented x 3. Normal affect.  Labs    Chemistry Recent Labs  Lab 01/30/23 2004 01/30/23 2140 02/03/23 0837 02/04/23 0100 02/05/23 0040  NA 143   < > 140 140 138  K 3.7   < > 3.6 4.1 3.9  CL 103   < > 98 96* 94*  CO2 31   < > 34* 36* 36*  GLUCOSE 141*   < > 141* 111* 114*  BUN 18   < > 31* 30* 31*  CREATININE 2.03*   < > 1.71* 1.54* 1.47*  CALCIUM 9.0   < > 8.8* 9.0 9.1  PROT 5.6*  --   --   --   --   ALBUMIN <1.5*  --   --   --   --   AST 19  --   --   --   --   ALT 22  --   --   --   --  ALKPHOS 49  --   --   --   --   BILITOT 0.8  --   --   --   --   GFRNONAA 33*   < > 41* 46* 49*  ANIONGAP 9   < > 8 8 8    < > = values in this interval not displayed.     Hematology Recent Labs  Lab 01/30/23 2004 01/30/23 2140 01/31/23 0000 01/31/23 1235 01/31/23 1806  WBC 9.2  --   --   --  6.6  RBC 3.50*  --   --   --  3.48*  HGB 10.3*   < > 9.9* 11.2* 10.1*  HCT 33.7*   < > 29.0* 33.0* 33.0*  MCV 96.3  --   --   --  94.8  MCH 29.4  --   --   --  29.0  MCHC 30.6  --   --   --  30.6  RDW 17.0*  --   --   --  16.9*  PLT 273  --   --   --  281   < > = values in this interval not displayed.    Cardiac Enzymes Recent Labs  Lab 01/30/23 2220 01/30/23 2355  TROPONINIHS 7 5    BNP Recent Labs  Lab 01/30/23 2220  BNP 230.3*     DDimerNo results for input(s): "DDIMER" in the last 168 hours.   Radiology    No results found.   Assessment & Plan   # Acute on chronic hypoxic respiratory failure # Acute on chronic HFpEF, compensated -2.7L urine output in the last 24 hours with net -2 L. Patient is net -12 L since admission. Switch IV Lasix to p.o. Lasix 40 mg twice daily. -Continue  spironolactone 12.5 mg once daily -Hold SGLT2 inhibitors due to UTI risk  # Permanent A-fib -Continue metoprolol tartrate 25 mg twice daily and Eliquis 5 mg twice daily  # HLD -Continue atorvastatin 20 mg nightly.   [facility] will sign off.   Medication Recommendations: P.o. Lasix 40 mg twice daily, metoprolol tartrate 25 mg twice daily, spironolactone 12.5 mg once daily, Eliquis 5 mg twice daily. Other recommendations (labs, testing, etc): BMP in 5 days. Follow up as an outpatient: Cardiology follow-up in 1 month upon discharge    Signed, Kwabena Strutz Norton Pastel, MD  02/05/2023, 9:29 AM

## 2023-02-06 DIAGNOSIS — I482 Chronic atrial fibrillation, unspecified: Secondary | ICD-10-CM | POA: Diagnosis not present

## 2023-02-06 DIAGNOSIS — N179 Acute kidney failure, unspecified: Secondary | ICD-10-CM | POA: Diagnosis not present

## 2023-02-06 DIAGNOSIS — I5033 Acute on chronic diastolic (congestive) heart failure: Secondary | ICD-10-CM | POA: Diagnosis not present

## 2023-02-06 DIAGNOSIS — I10 Essential (primary) hypertension: Secondary | ICD-10-CM | POA: Diagnosis not present

## 2023-02-06 LAB — GLUCOSE, CAPILLARY
Glucose-Capillary: 159 mg/dL — ABNORMAL HIGH (ref 70–99)
Glucose-Capillary: 123 mg/dL — ABNORMAL HIGH (ref 70–99)
Glucose-Capillary: 100 mg/dL — ABNORMAL HIGH (ref 70–99)
Glucose-Capillary: 160 mg/dL — ABNORMAL HIGH (ref 70–99)

## 2023-02-06 MED ORDER — BISACODYL 10 MG RE SUPP
10.0000 mg | Freq: Once | RECTAL | Status: AC
  Administered 2023-02-06: 10 mg via RECTAL
  Filled 2023-02-06: qty 1

## 2023-02-06 NOTE — Progress Notes (Signed)
Progress Note   Patient: [name] [name]:[id number] DOB: August 15, 1945 DOA: 01/30/2023     6 DOS: the patient was seen and examined on 02/06/2023   Brief hospital course: Mr. Gropp was admitted to the hospital with the working diagnosis of heart failure exacerbation.   77 yom w/ diastolic CHF, migraine, T2DM,hypertension, dyslipidemia, GERD, OSA and pulmonary hypertension,presented to ED W/ acute onset of worsening dyspnea with associated orthopnea and paroxysmal nocturnal dyspnea as well as dyspnea on exertion and lower extremity edema. EMS was called at [facility] because patient had AMS, hypoxia and hematuria initially indwelling urinary catheter.  He is complaining of back pain from injury a couple of weeks ago.  For EMS EKG showed A-fib with heart rate 80-90, respiration 24 ETCO2 47 CBG 163 pulse ox 95% on 8 L normally 95% on 4 L, was not on oxygen on arrival.  In the ED patient was hypotensive on arrival, concerning for sepsis. He was c/o dysuria and urinary frequency and urgency. initially 84/40 and later on 91/62, RR 27 and later 19 and pulse 70 was 93% on 6 L O2>was placed on BiPAP for respiratory distress and pulse extremity was 94% on 40% FiO2 on BiPAP.   Labs: Creatinine of 2.03 and glucose of 141,albumin <1.5.Lactic acid was 1.4 and CBC showed anemia close to baseline.  INR was 1.9 PT 21.8 with a PTT of 40.  Influenza antigens, COVID-19 and RSV PCR's came back negative.  UA was positive for UTI.Urine and blood cultures were drawn.  EXB:MWUXLK fibrillation with a rate of 101  Imaging: Portable chest x-ray showed finding consistent with CHF, however BNP not significantly elevated, CT head no acute finding.  Renal ultrasound increased echogenicity ABG suggestive of respiratory acidosis.  In ED given 1.75 L of IV LR, IV vancomycin, Flagyl and cefepime for initial hypotension and suspected sepsis. Patient's blood pressure improved, Dr Marchelle Gearing from critical care was consulted,  repeat blood gas showed improvement in pH  Patient is admitted under [facility] for further management.    06/27 patient responding well to diuresis for volume overload.  06/28 patient with improved volume status, possible transition to oral loop diuretic tomorrow.  Pending transfer to SNF. 06/29 patient is medically stable pending transfer to SNF.  06/30 patient continue medically stable, plan to transfer to SNF when bed available.   Assessment and Plan: Acute on chronic diastolic CHF (congestive heart failure) (HCC) Echocardiogram with preserved LV systolic function with EF 60 to 65%, mild LVH, RV systolic function preserved, LA and RA with moderate dilatation.   Urine output is 2,050 ml Systolic blood pressure systolic blood pressure 150 to 120 mmHg.   06/26 albumin IV   Continue metoprolol and spironolactone.  Transitioned to oral furosemide 40 mg po daily.  No SGLT 2 inh due to risk of urine infection, urinary retention, foley catheter in place.   Acute on chronic hypercapnic and hypoxemic respiratory failure Acute metabolic encephalopathy, resolved.  02 saturation is 97 to 96% on 5 L/min per [place].  Supplemental 02 per [place]. (Patient has home 02).    Acute kidney injury superimposed on chronic kidney disease (HCC) CKD stage 3b Hypokalemia,  Continue diuretic therapy with furosemide and spironolactone.   Check renal function in am.    Essential hypertension Continue blood pressure control with metoprolol.   Atrial fibrillation, chronic (HCC) Continue rate control with metoprolol and anticoagulation with apixaban.   BPH (benign prostatic hyperplasia) Continue with flomax, he has foley catheter  in place, he has failed voiding trials.  Follow up with Urology as outpatient.   Gout continue allopurinol.  Chronic back pain continue with as needed tramadol and  gabapentin to 100 mg at bedtime to prevent sedation.   Type 2 diabetes mellitus with hyperlipidemia (HCC) Continue  insulin sliding scale for glucose cover and monitoring.  Continue with statin therapy   Pressure injury of skin  Active Pressure Injury/Wound(s)     Pressure Ulcer  Duration          Pressure Injury 01/31/23 Buttocks Left Deep Tissue Pressure Injury - Purple or maroon localized area of discolored intact skin or blood-filled blister due to damage of underlying soft tissue from pressure and/or shear. Buttock Blanchable Purple; PT has s 3 days   Pressure Injury 01/31/23 Buttocks Right Stage 3 -  Full thickness tissue loss. Subcutaneous fat may be visible but bone, tendon or muscle are NOT exposed. 3 days   Pressure Injury 01/31/23 Perineum Posterior Unstageable - Full thickness tissue loss in which the base of the injury is covered by slough (yellow, tan, gray, green or brown) and/or eschar (tan, brown or black) in the wound bed. Pressure Wound Posterior S 3 days         Class 2 obesity Calculated BMI is 36,9   Sepsis secondary to UTI (HCC) Urine culture with no growth.  Culture from 01/12/23 with 20,000 CFU klebsiella pneumonia.   Patient has been afebrile and no leukocytosis.  Completed oral cephalexin total of 3 days.         Subjective: Patient with no chest pain or dyspnea, back pain continue better controlled. Today with persistent constipation   Physical Exam: Vitals:   02/05/23 2316 02/06/23 0000 02/06/23 0400 02/06/23 0824  BP:  107/76 123/80 (!) 152/97  Pulse: 91  81 93  Resp: 14 15  20   Temp:  97.8 F (36.6 C) 97.8 F (36.6 C) 98 F (36.7 C)  TempSrc:  Oral Oral Oral  SpO2: 99% 98% 96% 94%  Weight:      Height:       Neurology awake and alert ENT with mild pallor Cardiovascular with S1 and S2 present and rhythmic with no gallops, rubs or murmurs Respiratory with no rales or wheezing, no rhonchi Abdomen with no distention  No lower extremity edema  Data Reviewed:    Family Communication: no family at the bedside   Disposition: Status is:  Inpatient Remains inpatient appropriate because: pending transfer to SNF  Planned Discharge Destination: Skilled nursing facility    Author: Coralie Keens, MD 02/06/2023 10:55 AM  For on call review [email or web address].

## 2023-02-06 NOTE — Progress Notes (Signed)
This RN updated MD Arrien regarding pt. Pt has still not had a BM since PTA on 6/23. PRNs and scheduled dose of x 1 suppository given with no results. Pt states he does not remember the last time he had a BM. Currently, pt says he "does not feel constipated" and has no abd pain. No abd tenderness, distention, or nausea/ vomiting noted by this RN upon assessment. Pt continues to eat and drink well, but has limited mobility d/t weakness. RN to continue to monitor pt and encourage activity as tolerated.

## 2023-02-07 DIAGNOSIS — I10 Essential (primary) hypertension: Secondary | ICD-10-CM | POA: Diagnosis not present

## 2023-02-07 DIAGNOSIS — R601 Generalized edema: Secondary | ICD-10-CM | POA: Diagnosis not present

## 2023-02-07 DIAGNOSIS — I5033 Acute on chronic diastolic (congestive) heart failure: Secondary | ICD-10-CM | POA: Diagnosis not present

## 2023-02-07 DIAGNOSIS — I482 Chronic atrial fibrillation, unspecified: Secondary | ICD-10-CM | POA: Diagnosis not present

## 2023-02-07 DIAGNOSIS — N179 Acute kidney failure, unspecified: Secondary | ICD-10-CM | POA: Diagnosis not present

## 2023-02-07 LAB — RENAL FUNCTION PANEL
Albumin: 2.3 g/dL — ABNORMAL LOW (ref 3.5–5.0)
Anion gap: 9 (ref 5–15)
BUN: 33 mg/dL — ABNORMAL HIGH (ref 8–23)
CO2: 35 mmol/L — ABNORMAL HIGH (ref 22–32)
Calcium: 9.4 mg/dL (ref 8.9–10.3)
Chloride: 94 mmol/L — ABNORMAL LOW (ref 98–111)
Creatinine, Ser: 1.61 mg/dL — ABNORMAL HIGH (ref 0.61–1.24)
GFR, Estimated: 44 mL/min — ABNORMAL LOW (ref >60)
Glucose, Bld: 135 mg/dL — ABNORMAL HIGH (ref 70–99)
Phosphorus: 2 mg/dL — ABNORMAL LOW (ref 2.5–4.6)
Potassium: 4.7 mmol/L (ref 3.5–5.1)
Sodium: 138 mmol/L (ref 135–145)

## 2023-02-07 LAB — GLUCOSE, CAPILLARY
Glucose-Capillary: 123 mg/dL — ABNORMAL HIGH (ref 70–99)
Glucose-Capillary: 172 mg/dL — ABNORMAL HIGH (ref 70–99)
Glucose-Capillary: 106 mg/dL — ABNORMAL HIGH (ref 70–99)
Glucose-Capillary: 160 mg/dL — ABNORMAL HIGH (ref 70–99)

## 2023-02-07 NOTE — TOC Progression Note (Signed)
Transition of Care [facility]) - Progression Note    Patient Details  Name: [name] [name] MRN: [id number] Date of Birth: October 04, 1945  [facility]) CM/SW Contact  Leander Rams, LCSW Phone Number: 02/07/2023, 2:30 PM  Clinical Narrative:    Pt received [facility] and accepted. [facility] stated they can admit pt whenever insurance Berkley Harvey is approved. CSW started insurance auth. CSW communicated this information with pt daughter.   TOC will continue to follow.    Expected Discharge Plan: Skilled Nursing Facility Barriers to Discharge: Continued Medical Work up  Expected Discharge Plan and Services In-house Referral: Clinical Social Work Discharge Planning Services:  (CSW) Post Acute Care Choice: Skilled Nursing Facility Living arrangements for the past 2 months: Skilled Nursing Facility                 DME Arranged: N/A DME Agency: NA                   Social Determinants of Health (SDOH) Interventions SDOH Screenings   Food Insecurity: No Food Insecurity (01/31/2023)  Housing: Low Risk  (01/31/2023)  Transportation Needs: No Transportation Needs (01/31/2023)  Utilities: Not At Risk (01/31/2023)  Depression (PHQ2-9): Low Risk  (10/08/2022)  Tobacco Use: Medium Risk (01/31/2023)    Readmission Risk Interventions    01/14/2023    1:35 PM  Readmission Risk Prevention Plan  Transportation Screening Complete  Medication Review (RN Care Manager) Complete  PCP or Specialist appointment within 3-5 days of discharge Complete  HRI or Home Care Consult Complete  Palliative Care Screening Not Applicable  Skilled Nursing Facility Not Applicable   Oletta Lamas, MSW, LCSWA, LCASA Transitions of Care  Clinical Social Worker I

## 2023-02-07 NOTE — Plan of Care (Signed)

## 2023-02-07 NOTE — Progress Notes (Signed)
Progress Note   Patient: [name] [name] ZOX:[id number] DOB: 01-Nov-1945 DOA: 01/30/2023     7 DOS: the patient was seen and examined on 02/07/2023   Brief hospital course: Mr. Mallis was admitted to the hospital with the working diagnosis of heart failure exacerbation.   77 yom w/ diastolic CHF, migraine, T2DM,hypertension, dyslipidemia, GERD, OSA and pulmonary hypertension,presented to ED W/ acute onset of worsening dyspnea with associated orthopnea and paroxysmal nocturnal dyspnea as well as dyspnea on exertion and lower extremity edema. EMS was called at [facility] because patient had AMS, hypoxia and hematuria initially indwelling urinary catheter.  He is complaining of back pain from injury a couple of weeks ago.  For EMS EKG showed A-fib with heart rate 80-90, respiration 24 ETCO2 47 CBG 163 pulse ox 95% on 8 L normally 95% on 4 L, was not on oxygen on arrival.  In the ED patient was hypotensive on arrival, concerning for sepsis. He was c/o dysuria and urinary frequency and urgency. initially 84/40 and later on 91/62, RR 27 and later 19 and pulse 70 was 93% on 6 L O2>was placed on BiPAP for respiratory distress and pulse extremity was 94% on 40% FiO2 on BiPAP.   Labs: Creatinine of 2.03 and glucose of 141,albumin <1.5.Lactic acid was 1.4 and CBC showed anemia close to baseline.  INR was 1.9 PT 21.8 with a PTT of 40.  Influenza antigens, COVID-19 and RSV PCR's came back negative.  UA was positive for UTI.Urine and blood cultures were drawn.  WJX:BJYNWG fibrillation with a rate of 101  Imaging: Portable chest x-ray showed finding consistent with CHF, however BNP not significantly elevated, CT head no acute finding.  Renal ultrasound increased echogenicity ABG suggestive of respiratory acidosis.  In ED given 1.75 L of IV LR, IV vancomycin, Flagyl and cefepime for initial hypotension and suspected sepsis. Patient's blood pressure improved, Dr Marchelle Gearing from critical care was consulted,  repeat blood gas showed improvement in pH  Patient is admitted under [facility] for further management.    06/27 patient responding well to diuresis for volume overload.  06/28 patient with improved volume status, possible transition to oral loop diuretic tomorrow.  Pending transfer to SNF. 06/29 patient is medically stable pending transfer to SNF.  06/30 patient continue medically stable, plan to transfer to SNF when bed available.  07/01 continue stable for transfer to SNF.   Assessment and Plan: Acute on chronic diastolic CHF (congestive heart failure) (HCC) Echocardiogram with preserved LV systolic function with EF 60 to 65%, mild LVH, RV systolic function preserved, LA and RA with moderate dilatation.   Urine output is 1,400  ml Systolic blood pressure systolic blood pressure 102 to 110 mmHg.   06/26 albumin IV   Continue metoprolol. Diuresis with furosemide and spironolactone.   No SGLT 2 inh due to risk of urine infection, urinary retention, foley catheter in place.   Acute on chronic hypercapnic and hypoxemic respiratory failure Acute metabolic encephalopathy, resolved.  02 saturation is 99% on 2 L/min per [place].  Supplemental 02 per [place]. (Patient has home 02).    Acute kidney injury superimposed on chronic kidney disease (HCC) CKD stage 3b Hypokalemia,  Renal function with serum cr at 1,61 with K at 4,7 and serum bicarbonate at 35.  Na 138   Continue diuretic therapy with furosemide and spironolactone.   Essential hypertension Continue blood pressure control with metoprolol.   Atrial fibrillation, chronic (HCC) Continue rate control with metoprolol and anticoagulation  with apixaban.   BPH (benign prostatic hyperplasia) Continue with flomax, he has foley catheter in place, he has failed voiding trials.  Follow up with Urology as outpatient.   Gout Continue allopurinol.  For back pain continue as needed tramadol. Gabapentin has been discontinued due to somnolence.    Type 2 diabetes mellitus with hyperlipidemia (HCC) Continue insulin sliding scale for glucose cover and monitoring.  Continue with statin therapy   Pressure injury of skin  Active Pressure Injury/Wound(s)     Pressure Ulcer  Duration          Pressure Injury 01/31/23 Buttocks Left Deep Tissue Pressure Injury - Purple or maroon localized area of discolored intact skin or blood-filled blister due to damage of underlying soft tissue from pressure and/or shear. Buttock Blanchable Purple; PT has s 3 days   Pressure Injury 01/31/23 Buttocks Right Stage 3 -  Full thickness tissue loss. Subcutaneous fat may be visible but bone, tendon or muscle are NOT exposed. 3 days   Pressure Injury 01/31/23 Perineum Posterior Unstageable - Full thickness tissue loss in which the base of the injury is covered by slough (yellow, tan, gray, green or brown) and/or eschar (tan, brown or black) in the wound bed. Pressure Wound Posterior S 3 days         Class 2 obesity Calculated BMI is 36,9   Sepsis secondary to UTI (HCC) Urine culture with no growth.  Culture from 01/12/23 with 20,000 CFU klebsiella pneumonia.   Patient has been afebrile and no leukocytosis.  Completed oral cephalexin total of 3 days.         Subjective: Patient with no chest pain or dyspnea, back pain has improved. He reports having a bowel movement. Continue very weak and deconditioned   Physical Exam: Vitals:   02/06/23 2228 02/07/23 0130 02/07/23 0530 02/07/23 0845  BP:  110/70 105/79 110/77  Pulse: (!) 103 83 98 81  Resp: 15 18  17   Temp:  (!) 97 F (36.1 C) (!) 97.5 F (36.4 C) 98 F (36.7 C)  TempSrc:  Axillary Oral   SpO2: 96% 93% 90% 98%  Weight:   105.7 kg   Height:       Neurology awake and alert ENT with mild pallor Cardiovascular with S1 and S2 present and rhythmic, no gallops or rubs, positive systolic murmur at the right lower sternal border Respiratory with no rales or wheezing on anterior  auscultation  Abdomen with no distention No lower extremity edema  Data Reviewed:    Family Communication: no family at the bedside   Disposition: Status is: Inpatient Remains inpatient appropriate because: pending transfer to SNF   Planned Discharge Destination: SNF      Author: Coralie Keens, MD 02/07/2023 12:05 PM  For on call review [email or web address].

## 2023-02-07 NOTE — Progress Notes (Signed)
   02/07/23 2228  BiPAP/CPAP/SIPAP  $ Non-Invasive Home Ventilator  Subsequent  BiPAP/CPAP/SIPAP Pt Type Adult  BiPAP/CPAP/SIPAP Resmed  Mask Type Full face mask  Mask Size Medium  Respiratory Rate 16 breaths/min  Flow Rate 3 lpm  Patient Home Equipment No  Auto Titrate Yes (6-18)  BiPAP/CPAP /SiPAP Vitals  Pulse Rate (!) 104  Resp 16  SpO2 93 %  Bilateral Breath Sounds Diminished  MEWS Score/Color  MEWS Score 1  MEWS Score Color [name] [name]

## 2023-02-07 NOTE — Progress Notes (Signed)
Daily Progress Note   Patient Name: [name] [name]       Date: 02/07/2023 DOB: 10-Oct-1945  Age: 77 y.o. MRN#: [id number] Attending Physician: Coralie Keens Primary Care Physician: Corwin Levins, MD Admit Date: 01/30/2023  Reason for Consultation/Follow-up: Establishing goals of care  Subjective: Medical records reviewed including progress notes, labs, imaging. Patient assessed at the bedside. He is sitting up in bedside chair, eating lunch. His daughter and grandson are present visiting.  Reviewed interval history over the past weekend and continued ongoing goals of care discussions. Patient had a difficult Friday due to pain, which has since improved. Goals of care at this time are for his discharge to SNF. Recommended outpatient palliative care for ongoing support and patient/family are agreeable.   Questions and concerns addressed. PMT will continue to support holistically.   Length of Stay: 7   Physical Exam Vitals and nursing note reviewed.  Constitutional:      General: He is not in acute distress.    Appearance: He is ill-appearing.  Cardiovascular:     Rate and Rhythm: Normal rate.  Pulmonary:     Effort: Pulmonary effort is normal. No respiratory distress.  Neurological:     Mental Status: He is alert. Mental status is at baseline.  Psychiatric:        Behavior: Behavior is cooperative.             Vital Signs: BP 110/77 (BP Location: Left Arm)   Pulse 81   Temp 98 F (36.7 C)   Resp 17   Ht 5\' 8"  (1.727 m)   Wt 105.7 kg   SpO2 98%   BMI 35.43 kg/m  SpO2: SpO2: 98 % O2 Device: O2 Device: Nasal Cannula O2 Flow Rate: O2 Flow Rate (L/min): 2 L/min      Palliative Assessment/Data: 40%   Palliative Care Assessment & Plan   Patient Profile: 77 y.o. male   with past medical history of diastolic CHF, migraine, type 2 diabetes mellitus, hypertension, dyslipidemia, GERD, OSA and pulmonary hypertension admitted on 01/30/2023 with worsening dyspnea, orthopnea, PND, dyspnea on exertion, bilateral edema in lower extremities.    Patient was admitted for acute on chronic diastolic CHF, acute hypoxic and hypercapnic respiratory failure.  Patient has been admitted 3 times in the past 6 months, most recently  less than 30 days ago. PMT has been consulted to assist with goals of care conversation.  Assessment: Goals of care conversation Acute on chronic respiratory failure with hypoxia and hypercarbia Acute metabolic encephalopathy Acute on chronic diastolic CHF Acute pulmonary edema AKI on CKD 3A Multiple pressure ulcers  Recommendations/Plan: Continue full code (family wants 1 attempt of CPR) Full scope treatment with the exception of NO mechanical ventilation  TOC consulted for outpatient palliative care referral. Assistance is appreciated PMT will follow peripherally. Please secure chat or call team line with urgent needs   Prognosis: Guarded to poor  Discharge Planning: Skilled Nursing Facility for rehab with Palliative care service follow-up  Care plan was discussed with patient, patient's daughter, grandson   Total time: I spent 35 minutes in the care of the patient today in the above activities and documenting the encounter.   Richardson Dopp, PA-C Palliative Medicine Team Team phone # [phone]  Thank you for allowing the Palliative Medicine Team to assist in the care of this patient. Please utilize secure chat with additional questions, if there is no response within 30 minutes please call the above phone number.  Palliative Medicine Team providers are available by phone from 7am to 7pm daily and can be reached through the team cell phone.  Should this patient require assistance outside of these hours, please call the patient's  attending physician.  Portions of this note are a verbal dictation therefore any spelling and/or grammatical errors are due to the "[employer] One" system interpretation.

## 2023-02-07 NOTE — Progress Notes (Signed)
Mobility Specialist Progress Note:    02/07/23 1527  Mobility  Activity Transferred from chair to bed  Level of Assistance +2 (takes two people)  Press photographer wheel walker  Activity Response Tolerated well  Mobility Referral Yes  $Mobility charge 1 Mobility  Mobility Specialist Start Time (ACUTE ONLY) 1343  Mobility Specialist Stop Time (ACUTE ONLY) 1402  Mobility Specialist Time Calculation (min) (ACUTE ONLY) 19 min   Pt received in chair requesting to return to bed. Attempted to stand x3 w/ MaxA but unable. NT was able to help stand pt making him a MinA +2. Pt experienced an episode of bowel incontinence, therefore MS was helping with steadying while NT performed pericare. Returned to bed w/ call bell and personal belongings at hand. All needs met.   Thompson Grayer Mobility Specialist  Please contact vis Secure Chat or  Rehab Office [phone]

## 2023-02-07 NOTE — Progress Notes (Signed)
Physical Therapy Treatment Patient Details Name: [name] [name] MRN: [id number] DOB: 21-Jan-1946 Today's Date: 02/07/2023   History of Present Illness Pt is 77 year old presented to [facility] via EMS on 01/30/23 from [facility] with AMS, hypoxia, and hematuria in indewelling urinary catheter. Pt c/o back pain from injury a couple of weeks ago. Afib 80-90, RR 24, ETCO2 47, CBG 163. Sp02 on 8L 95%, normally 95% on 4L however found by EMS on no oxygen on arrival to scene. Hypotensive on arrival. Admitted for treatment of acute on chronic diastolic CHF and AKI on cKD PMH - T12-L1 fx with ORIF 11/23/22, morbid obesity, OSA, OHS, DM2, HTN, HLD, COPD, severe pulmonary hypertension, chronic diastolic and right-sided CHF, chronic 3 to 5 L oxygen requirement, permanent A-fib on Eliquis, migraine, arthritis, gout    PT Comments  Pt reporting much less back pain this date vs previously, does complain of buttocks pain especially during washing up after BM during session. Pt requiring much less physical assist today, performing repeated transfers as well as bed mobility with min assist only. Pt with limited activity tolerance, but improving. Plan remains appropriate at this time.      Assistance Recommended at Discharge Frequent or constant Supervision/Assistance  If plan is discharge home, recommend the following:  Can travel by private vehicle    A lot of help with walking and/or transfers;A lot of help with bathing/dressing/bathroom;Assist for transportation   No  Equipment Recommendations  None recommended by PT    Recommendations for Other Services       Precautions / Restrictions Precautions Precautions: Fall Precaution Comments: limited by back pain,T12-L1 fx with ORIF 11/23/22, watch O2 SATs, 3-5L O2 required Restrictions Weight Bearing Restrictions: No     Mobility  Bed Mobility Overal bed mobility: Needs Assistance Bed Mobility: Rolling, Sidelying to Sit Rolling: Min assist Sidelying to sit:  Min assist       General bed mobility comments: assist for trunk elevation and scooting to EOB, use of bedrails    Transfers Overall transfer level: Needs assistance Equipment used: Rolling walker (2 wheels) Transfers: Sit to/from Stand, Bed to chair/wheelchair/BSC Sit to Stand: Min assist   Step pivot transfers: Min assist       General transfer comment: assist for power up, rise, steady, and pivot to recliner. stand x2, from EOB and recliner    Ambulation/Gait               General Gait Details: nt   Comptroller Bed    Modified Rankin (Stroke Patients Only)       Balance Overall balance assessment: Needs assistance Sitting-balance support: Feet supported, Bilateral upper extremity supported Sitting balance-[name] Scale: Fair     Standing balance support: Bilateral upper extremity supported, During functional activity, Reliant on assistive device for balance Standing balance-[name] Scale: Poor Standing balance comment: Reliant on RW                            Cognition Arousal/Alertness: Awake/alert Behavior During Therapy: Flat affect Overall Cognitive Status: History of cognitive impairments - at baseline                                 General Comments: history of dementia  Exercises General Exercises - Lower Extremity Long Arc Quad: AROM, Both, 10 reps, Seated    General Comments General comments (skin integrity, edema, etc.): 3LO2, vss. Pt with stool incontinence, PT assisted in clean up. Sacral wounds covered with dressing, but buttocks sore with min bleeding with wash up      Pertinent Vitals/Pain Pain Assessment Pain Assessment: Faces Faces Pain Scale: Hurts little more Pain Location: buttocks during pericare Pain Descriptors / Indicators: Grimacing, Guarding, Moaning, Discomfort Pain Intervention(s): Limited activity within patient's tolerance, Monitored  during session, Repositioned    Home Living                          Prior Function            PT Goals (current goals can now be found in the care plan section) Acute Rehab PT Goals Patient Stated Goal: Return home PT Goal Formulation: With patient Time For Goal Achievement: 01/28/23 Potential to Achieve Goals: Fair Progress towards PT goals: Progressing toward goals    Frequency    Min 1X/week      PT Plan Current plan remains appropriate    Co-evaluation              AM-PAC PT "6 Clicks" Mobility   Outcome Measure  Help needed turning from your back to your side while in a flat bed without using bedrails?: A Little Help needed moving from lying on your back to sitting on the side of a flat bed without using bedrails?: A Little Help needed moving to and from a bed to a chair (including a wheelchair)?: A Little Help needed standing up from a chair using your arms (e.g., wheelchair or bedside chair)?: A Little Help needed to walk in hospital room?: A Lot Help needed climbing 3-5 steps with a railing? : Total 6 Click Score: 15    End of Session Equipment Utilized During Treatment: Oxygen;Gait belt Activity Tolerance: Patient limited by pain Patient left: with call bell/phone within reach;in bed;with bed alarm set;with family/visitor present Nurse Communication: Mobility status PT Visit Diagnosis: Other abnormalities of gait and mobility (R26.89);History of falling (Z91.81);Muscle weakness (generalized) (M62.81);Pain     Time: 1207-1222 PT Time Calculation (min) (ACUTE ONLY): 15 min  Charges:    $Therapeutic Activity: 8-22 mins PT General Charges $$ ACUTE PT VISIT: 1 Visit                     Marye Round, PT DPT Acute Rehabilitation Services Secure Chat Preferred  Office [phone]    Aerik Polan E Christain [place] 02/07/2023, 1:46 PM

## 2023-02-08 DIAGNOSIS — I10 Essential (primary) hypertension: Secondary | ICD-10-CM | POA: Diagnosis not present

## 2023-02-08 DIAGNOSIS — I5033 Acute on chronic diastolic (congestive) heart failure: Secondary | ICD-10-CM | POA: Diagnosis not present

## 2023-02-08 DIAGNOSIS — I482 Chronic atrial fibrillation, unspecified: Secondary | ICD-10-CM | POA: Diagnosis not present

## 2023-02-08 DIAGNOSIS — N179 Acute kidney failure, unspecified: Secondary | ICD-10-CM | POA: Diagnosis not present

## 2023-02-08 LAB — GLUCOSE, CAPILLARY
Glucose-Capillary: 115 mg/dL — ABNORMAL HIGH (ref 70–99)
Glucose-Capillary: 125 mg/dL — ABNORMAL HIGH (ref 70–99)
Glucose-Capillary: 118 mg/dL — ABNORMAL HIGH (ref 70–99)
Glucose-Capillary: 155 mg/dL — ABNORMAL HIGH (ref 70–99)

## 2023-02-08 NOTE — Progress Notes (Signed)
   02/08/23 2133  BiPAP/CPAP/SIPAP  $ Non-Invasive Home Ventilator  Subsequent  BiPAP/CPAP/SIPAP Pt Type Adult  BiPAP/CPAP/SIPAP Resmed  Mask Type Full face mask  Mask Size Medium  Respiratory Rate 20 breaths/min  Flow Rate 3 lpm  CPAP/SIPAP surface wiped down Yes   Patient on remed sleeping comfortable with Spo2 at 95% with 3L bleed in.

## 2023-02-08 NOTE — Progress Notes (Signed)
Progress Note   Patient: [name] [name] WUJ:[id number] DOB: 1946-01-04 DOA: 01/30/2023     8 DOS: the patient was seen and examined on 02/08/2023   Brief hospital course: Mr. Gambler was admitted to the hospital with the working diagnosis of heart failure exacerbation.   77 yom w/ diastolic CHF, migraine, T2DM,hypertension, dyslipidemia, GERD, OSA and pulmonary hypertension,presented to ED W/ acute onset of worsening dyspnea with associated orthopnea and paroxysmal nocturnal dyspnea as well as dyspnea on exertion and lower extremity edema. EMS was called at [facility] because patient had AMS, hypoxia and hematuria initially indwelling urinary catheter.  He is complaining of back pain from injury a couple of weeks ago.  For EMS EKG showed A-fib with heart rate 80-90, respiration 24 ETCO2 47 CBG 163 pulse ox 95% on 8 L normally 95% on 4 L, was not on oxygen on arrival.  In the ED patient was hypotensive on arrival, concerning for sepsis. He was c/o dysuria and urinary frequency and urgency. initially 84/40 and later on 91/62, RR 27 and later 19 and pulse 70 was 93% on 6 L O2>was placed on BiPAP for respiratory distress and pulse extremity was 94% on 40% FiO2 on BiPAP.   Labs: Creatinine of 2.03 and glucose of 141,albumin <1.5.Lactic acid was 1.4 and CBC showed anemia close to baseline.  INR was 1.9 PT 21.8 with a PTT of 40.  Influenza antigens, COVID-19 and RSV PCR's came back negative.  UA was positive for UTI.Urine and blood cultures were drawn.  NFA:OZHYQM fibrillation with a rate of 101  Imaging: Portable chest x-ray showed finding consistent with CHF, however BNP not significantly elevated, CT head no acute finding.  Renal ultrasound increased echogenicity ABG suggestive of respiratory acidosis.  In ED given 1.75 L of IV LR, IV vancomycin, Flagyl and cefepime for initial hypotension and suspected sepsis. Patient's blood pressure improved, Dr Marchelle Gearing from critical care was consulted,  repeat blood gas showed improvement in pH  Patient is admitted under [facility] for further management.    06/27 patient responding well to diuresis for volume overload.  06/28 patient with improved volume status, possible transition to oral loop diuretic tomorrow.  Pending transfer to SNF. 06/29 patient is medically stable pending transfer to SNF.  06/30 patient continue medically stable, plan to transfer to SNF when bed available.  07/01 continue stable for transfer to SNF.   Assessment and Plan: Acute on chronic diastolic CHF (congestive heart failure) (HCC) Echocardiogram with preserved LV systolic function with EF 60 to 65%, mild LVH, RV systolic function preserved, LA and RA with moderate dilatation.   Fluid status is negative, since admission, -14,954 ml.  Systolic blood pressure systolic blood pressure 104 to 96 mmHg.   06/26 albumin IV   Continue metoprolol. Diuresis spironolactone, hold on furosemide for now due to signs of low volume, will check renal function in am, before resumption of furosemide, his dose is 40 mg daily.   No SGLT 2 inh due to risk of urine infection, urinary retention, foley catheter in place.   Acute on chronic hypercapnic and hypoxemic respiratory failure Acute metabolic encephalopathy, resolved.  02 saturation is 100% on 3 L/min per [place].  Supplemental 02 per [place]. (Patient has home 02).    Acute kidney injury superimposed on chronic kidney disease (HCC) CKD stage 3b Hypokalemia,  Hold on furosemide today and will check renal function in am, prior to resuming furosemide.   Continue spironolactone.   Essential hypertension Continue  blood pressure control with metoprolol.   Atrial fibrillation, chronic (HCC) Continue rate control with metoprolol and anticoagulation with apixaban.   BPH (benign prostatic hyperplasia) Continue with flomax, he has foley catheter in place, he has failed voiding trials.  Follow up with Urology as outpatient.    Gout Continue allopurinol.  For back pain continue as needed tramadol. Gabapentin has been discontinued due to somnolence.   Type 2 diabetes mellitus with hyperlipidemia (HCC) Continue insulin sliding scale for glucose cover and monitoring.  Continue with statin therapy   Pressure injury of skin  Active Pressure Injury/Wound(s)     Pressure Ulcer  Duration          Pressure Injury 01/31/23 Buttocks Left Deep Tissue Pressure Injury - Purple or maroon localized area of discolored intact skin or blood-filled blister due to damage of underlying soft tissue from pressure and/or shear. Buttock Blanchable Purple; PT has s 3 days   Pressure Injury 01/31/23 Buttocks Right Stage 3 -  Full thickness tissue loss. Subcutaneous fat may be visible but bone, tendon or muscle are NOT exposed. 3 days   Pressure Injury 01/31/23 Perineum Posterior Unstageable - Full thickness tissue loss in which the base of the injury is covered by slough (yellow, tan, gray, green or brown) and/or eschar (tan, brown or black) in the wound bed. Pressure Wound Posterior S 3 days         Class 2 obesity Calculated BMI is 36,9   Sepsis secondary to UTI (HCC) Urine culture with no growth.  Culture from 01/12/23 with 20,000 CFU klebsiella pneumonia.   Patient has been afebrile and no leukocytosis.  Completed oral cephalexin total of 3 days.         Subjective: patient is feeling better, dyspnea and edema has improved.   Physical Exam: Vitals:   02/08/23 0324 02/08/23 0553 02/08/23 0731 02/08/23 1123  BP:  104/72 128/76 96/69  Pulse:  77 90 75  Resp:  19 14 20   Temp:  97.6 F (36.4 C) 97.8 F (36.6 C) 97.6 F (36.4 C)  TempSrc:  Oral Oral Oral  SpO2: 99% 95% 98% 100%  Weight:  105.2 kg    Height:       Neurology awake and alert ENT with mild pallor Cardiovascular with S1 and S2 present, with systolic murmur at the apex.  Respiratory with no rales or wheezing or rhonchi Abdomen with no distention   No lower extremity edema.  Data Reviewed:    Family Communication: no family at the bedside   Disposition: Status is: Inpatient Remains inpatient appropriate because: pending with SNF.   Planned Discharge Destination: Skilled nursing facility     Author: Coralie Keens, MD 02/08/2023 3:05 PM  For on call review [email or web address].

## 2023-02-08 NOTE — Progress Notes (Signed)
PROGRESS NOTE    [name] [name]  ZOX:[id number] DOB: 26-Aug-1945 DOA: 01/30/2023 PCP: Corwin Levins, MD  48 yom w/ diastolic CHF, migraine, T2DM,hypertension, dyslipidemia, GERD, OSA and pulmonary hypertension,presented to ED W/ acute onset of worsening dyspnea with associated orthopnea and paroxysmal nocturnal dyspnea and lower extremity edema. -EMS was called at [facility] because patient had AMS, hypoxia -In the ED he was hypoxic, hypotensive, placed on BiPAP, diuretics, abnormal UA, creatinine 2.0, albumin less than 1.5 -portable chest x-ray showed finding consistent with CHF, CT head no acute finding.  Renal ultrasound increased echogenicity ABG suggestive of respiratory acidosis.   In ED given 1.75 L of IV LR, IV vancomycin, Flagyl and cefepime for suspected sepsis. Patient's blood pressure improved, Dr Marchelle Gearing from critical care was consulted, -6/27 patient responding well to diuresis for volume overload.  -6/28 patient with improved volume status -Pending transfer to SNF. -6/29 patient is medically stable pending transfer to SNF.  -6/30 patient continue medically stable, plan to transfer to SNF when bed available.  -7/01 continue stable for transfer to SNF.    Subjective: Feels okay, hurts all over, has chronic pain  Assessment and Plan:  Acute on chronic diastolic CHF Severe hypoalbuminemia and third spacing -Echo with EF 60 to 65%, mild LVH, RV systolic function preserved, LA and RA with moderate dilatation.  -diuresed with IV lasix, 6/26 given albumin IV, volume status has improved he is 15.2 L negative, weight down 16 LB -continue metoprolol, spironolactone,  -Loop diuretics were held yesterday, will start torsemide today instead of Lasix given severe hypoalbuminemia -No SGLT 2 inh due to risk of urine infection, urinary retention, foley catheter in place.   Acute on chronic hypercapnic and hypoxemic respiratory failure Acute metabolic encephalopathy,  resolved.  -Mental status has improved   Acute kidney injury superimposed on chronic kidney disease (HCC) CKD stage 3b Hypokalemia, -Creatinine stabilizing, starting torsemide today -Continue spironolactone.   Sepsis secondary to UTI (HCC) Culture from 01/12/23 with 20,000 CFU klebsiella pneumonia.  Patient has been afebrile and no leukocytosis.  Completed oral cephalexin total of 3 days.   Essential hypertension Continue metoprolol.   Atrial fibrillation, chronic (HCC) Continue  metoprolol and apixaban.   BPH (benign prostatic hyperplasia) Continue with flomax, he has foley catheter in place, he has failed voiding trials.  Follow up with Urology as outpatient.   Gout Continue allopurinol. -For back pain continue as needed tramadol. Gabapentin has been discontinued due to somnolence.   Type 2 diabetes mellitus with hyperlipidemia (HCC) Continue insulin sliding scale for glucose cover and monitoring.  Continue with statin therapy   Pressure injury of skin  Active Pressure Injury/Wound(s)     Pressure Ulcer  Duration          Pressure Injury 01/31/23 Buttocks Left Deep Tissue Pressure Injury - Purple or maroon localized area of discolored intact skin or blood-filled blister due to damage of underlying soft tissue from pressure and/or shear. Buttock Blanchable Purple; PT has s 3 days   Pressure Injury 01/31/23 Buttocks Right Stage 3 -  Full thickness tissue loss. Subcutaneous fat may be visible but bone, tendon or muscle are NOT exposed. 3 days   Pressure Injury 01/31/23 Perineum Posterior Unstageable - Full thickness tissue loss in which the base of the injury is covered by slough (yellow, tan, gray, green or brown) and/or eschar (tan, brown or black) in the wound bed. Pressure Wound Posterior S 3 days  Class 2 obesity Calculated BMI is 36,9     DVT prophylaxis: apixaban Code Status: Full Code Family Communication: None present Disposition Plan: To SNF when  bed available  Consultants:    Procedures:   Antimicrobials:    Objective: Vitals:   02/08/23 0324 02/08/23 0553 02/08/23 0731 02/08/23 1123  BP:  104/72 128/76 96/69  Pulse:  77 90 75  Resp:  19 14 20   Temp:  97.6 F (36.4 C) 97.8 F (36.6 C) 97.6 F (36.4 C)  TempSrc:  Oral Oral Oral  SpO2: 99% 95% 98% 100%  Weight:  105.2 kg    Height:        Intake/Output Summary (Last 24 hours) at 02/08/2023 1627 Last data filed at 02/08/2023 1300 Gross per 24 hour  Intake 300 ml  Output 900 ml  Net -600 ml   Filed Weights   02/05/23 0431 02/07/23 0530 02/08/23 0553  Weight: 110.2 kg 105.7 kg 105.2 kg    Examination:      Data Reviewed:   CBC: No results for input(s): "WBC", "NEUTROABS", "HGB", "HCT", "MCV", "PLT" in the last 168 hours. Basic Metabolic Panel: Recent Labs  Lab 02/02/23 0839 02/03/23 0837 02/04/23 0100 02/05/23 0040 02/07/23 0053  NA 142 140 140 138 138  K 3.1* 3.6 4.1 3.9 4.7  CL 102 98 96* 94* 94*  CO2 34* 34* 36* 36* 35*  GLUCOSE 113* 141* 111* 114* 135*  BUN 34* 31* 30* 31* 33*  CREATININE 1.84* 1.71* 1.54* 1.47* 1.61*  CALCIUM 8.8* 8.8* 9.0 9.1 9.4  MG 1.7 1.7 2.0  --   --   PHOS  --   --   --   --  2.0*   GFR: Estimated Creatinine Clearance: 45.2 mL/min (A) (by C-G formula based on SCr of 1.61 mg/dL (H)). Liver Function Tests: Recent Labs  Lab 02/07/23 0053  ALBUMIN 2.3*   No results for input(s): "LIPASE", "AMYLASE" in the last 168 hours. No results for input(s): "AMMONIA" in the last 168 hours. Coagulation Profile: No results for input(s): "INR", "PROTIME" in the last 168 hours. Cardiac Enzymes: No results for input(s): "CKTOTAL", "CKMB", "CKMBINDEX", "TROPONINI" in the last 168 hours. BNP (last 3 results) No results for input(s): "PROBNP" in the last 8760 hours. HbA1C: No results for input(s): "HGBA1C" in the last 72 hours. CBG: Recent Labs  Lab 02/07/23 1636 02/07/23 2132 02/08/23 0555 02/08/23 1121 02/08/23 1558   GLUCAP 106* 160* 115* 125* 118*   Lipid Profile: No results for input(s): "CHOL", "HDL", "LDLCALC", "TRIG", "CHOLHDL", "LDLDIRECT" in the last 72 hours. Thyroid Function Tests: No results for input(s): "TSH", "T4TOTAL", "FREET4", "T3FREE", "THYROIDAB" in the last 72 hours. Anemia Panel: No results for input(s): "VITAMINB12", "FOLATE", "FERRITIN", "TIBC", "IRON", "RETICCTPCT" in the last 72 hours. Urine analysis:    Component Value Date/Time   COLORURINE RED (A) 01/30/2023 2007   APPEARANCEUR HAZY (A) 01/30/2023 2007   LABSPEC 1.008 01/30/2023 2007   PHURINE 6.0 01/30/2023 2007   GLUCOSEU 50 (A) 01/30/2023 2007   GLUCOSEU NEGATIVE 10/08/2022 1611   HGBUR MODERATE (A) 01/30/2023 2007   BILIRUBINUR NEGATIVE 01/30/2023 2007   KETONESUR NEGATIVE 01/30/2023 2007   PROTEINUR 100 (A) 01/30/2023 2007   UROBILINOGEN 0.2 10/08/2022 1611   NITRITE NEGATIVE 01/30/2023 2007   LEUKOCYTESUR MODERATE (A) 01/30/2023 2007   Sepsis Labs: @LABRCNTIP (procalcitonin:4,lacticidven:4)  ) Recent Results (from the past 240 hour(s))  Blood Culture (routine x 2)     Status: None   Collection Time: 01/30/23  8:04 PM   Specimen: BLOOD RIGHT ARM  Result Value Ref Range Status   Specimen Description BLOOD RIGHT ARM  Final   Special Requests   Final    BOTTLES DRAWN AEROBIC AND ANAEROBIC Blood Culture results may not be optimal due to an excessive volume of blood received in culture bottles   Culture   Final    NO GROWTH 5 DAYS Performed at [facility], [address], [place], [place] [address]    Report Status 02/04/2023 FINAL  Final  Urine Culture     Status: None   Collection Time: 01/30/23  8:07 PM   Specimen: Urine, Random  Result Value Ref Range Status   Specimen Description URINE, RANDOM  Final   Special Requests NONE Reflexed from [id number]  Final   Culture   Final    NO GROWTH Performed at [facility], [address], [place], [place] [address]    Report Status 02/01/2023  FINAL  Final  Resp panel by RT-PCR (RSV, Flu A&B, Covid) Anterior Nasal Swab     Status: None   Collection Time: 01/30/23  8:10 PM   Specimen: Anterior Nasal Swab  Result Value Ref Range Status   SARS Coronavirus 2 by RT PCR NEGATIVE NEGATIVE Final   Influenza A by PCR NEGATIVE NEGATIVE Final   Influenza B by PCR NEGATIVE NEGATIVE Final    Comment: (NOTE) The Xpert Xpress SARS-CoV-2/FLU/RSV plus assay is intended as an aid in the diagnosis of influenza from Nasopharyngeal swab specimens and should not be used as a sole basis for treatment. Nasal washings and aspirates are unacceptable for Xpert Xpress SARS-CoV-2/FLU/RSV testing.  Fact Sheet for Patients: BloggerCourse.com  Fact Sheet for Healthcare Providers: SeriousBroker.it  This test is not yet approved or cleared by the [place] FDA and has been authorized for detection and/or diagnosis of SARS-CoV-2 by FDA under an Emergency Use Authorization (EUA). This EUA will remain in effect (meaning this test can be used) for the duration of the COVID-19 declaration under Section 564(b)(1) of the Act, 21 U.S.C. section 360bbb-3(b)(1), unless the authorization is terminated or revoked.     Resp Syncytial Virus by PCR NEGATIVE NEGATIVE Final    Comment: (NOTE) Fact Sheet for Patients: BloggerCourse.com  Fact Sheet for Healthcare Providers: SeriousBroker.it  This test is not yet approved or cleared by the [place] FDA and has been authorized for detection and/or diagnosis of SARS-CoV-2 by FDA under an Emergency Use Authorization (EUA). This EUA will remain in effect (meaning this test can be used) for the duration of the COVID-19 declaration under Section 564(b)(1) of the Act, 21 U.S.C. section 360bbb-3(b)(1), unless the authorization is terminated or revoked.  Performed at [facility], [address],  [place], [place] [address]   Blood Culture (routine x 2)     Status: None   Collection Time: 01/30/23  8:23 PM   Specimen: BLOOD LEFT ARM  Result Value Ref Range Status   Specimen Description BLOOD LEFT ARM  Final   Special Requests   Final    BOTTLES DRAWN AEROBIC AND ANAEROBIC Blood Culture adequate volume   Culture   Final    NO GROWTH 5 DAYS Performed at [facility], [address], [place], [place] [address]    Report Status 02/04/2023 FINAL  Final     Radiology Studies: No results found.   Scheduled Meds:  acetaminophen  1,000 mg Oral TID   allopurinol  100 mg  Oral Daily   apixaban  5 mg Oral BID   atorvastatin  20 mg Oral Daily   Chlorhexidine Gluconate Cloth  6 each Topical Daily   cholecalciferol  2,000 Units Oral Daily   [START ON 02/14/2023] cyanocobalamin  1,000 mcg Intramuscular Q30 days   Gerhardt's butt cream   Topical BID   hydrocerin   Topical BID   insulin aspart  0-5 Units Subcutaneous QHS   insulin aspart  0-9 Units Subcutaneous TID WC   leptospermum manuka honey  1 Application Topical Daily   lidocaine  2 patch Transdermal Daily   metoprolol tartrate  25 mg Oral BID   polyethylene glycol  17 g Oral BID   saccharomyces boulardii  250 mg Oral BID   spironolactone  12.5 mg Oral Daily   tamsulosin  0.4 mg Oral QPC supper   Continuous Infusions:   LOS: 8 days    Time spent:    Zannie Cove, MD [facility]   02/08/2023, 4:27 PM

## 2023-02-08 NOTE — Progress Notes (Signed)
Pt requests for CPAP to come off , states that he no longer can tolerate it. Pt states that he does wear a CPAP at home. Pt 3 LNC (chronic) applied, sats 98-100%. Pt informed that if sats drop CPAP may have to be reapplied , pt agrees . Respiratory therapist notified .

## 2023-02-09 DIAGNOSIS — I5033 Acute on chronic diastolic (congestive) heart failure: Secondary | ICD-10-CM | POA: Diagnosis not present

## 2023-02-09 LAB — GLUCOSE, CAPILLARY
Glucose-Capillary: 120 mg/dL — ABNORMAL HIGH (ref 70–99)
Glucose-Capillary: 118 mg/dL — ABNORMAL HIGH (ref 70–99)
Glucose-Capillary: 139 mg/dL — ABNORMAL HIGH (ref 70–99)
Glucose-Capillary: 135 mg/dL — ABNORMAL HIGH (ref 70–99)

## 2023-02-09 LAB — BASIC METABOLIC PANEL
Anion gap: 8 (ref 5–15)
BUN: 32 mg/dL — ABNORMAL HIGH (ref 8–23)
CO2: 31 mmol/L (ref 22–32)
Calcium: 9.4 mg/dL (ref 8.9–10.3)
Chloride: 98 mmol/L (ref 98–111)
Creatinine, Ser: 1.54 mg/dL — ABNORMAL HIGH (ref 0.61–1.24)
GFR, Estimated: 46 mL/min — ABNORMAL LOW (ref >60)
Glucose, Bld: 135 mg/dL — ABNORMAL HIGH (ref 70–99)
Potassium: 4.3 mmol/L (ref 3.5–5.1)
Sodium: 137 mmol/L (ref 135–145)

## 2023-02-09 MED ORDER — FUROSEMIDE 40 MG PO TABS
40.0000 mg | ORAL_TABLET | Freq: Every day | ORAL | Status: DC
  Administered 2023-02-09: 40 mg via ORAL
  Filled 2023-02-09: qty 1

## 2023-02-09 MED ORDER — TORSEMIDE 20 MG PO TABS
20.0000 mg | ORAL_TABLET | Freq: Every day | ORAL | Status: DC
  Administered 2023-02-10 – 2023-02-13 (×4): 20 mg via ORAL
  Filled 2023-02-09 (×5): qty 1

## 2023-02-09 NOTE — Progress Notes (Signed)
   Medical records reviewed. Goals of care remain clear for discharge to SNF with outpatient palliative care follow up and patient/family are hopeful for improvement.  PMT will continue to follow peripherally. Patient and family have been encouraged to reach out with additional needs or concerns.   Thank you for your referral and allowing PMT to assist in [name]'s care.   Richardson Dopp, [facility] Palliative Medicine Team  Team Phone # [phone]   NO CHARGE

## 2023-02-09 NOTE — Progress Notes (Signed)
Occupational Therapy Treatment Patient Details Name: [name] [name] MRN: [id number] DOB: Mar 25, 1946 Today's Date: 02/09/2023   History of present illness Pt is 77 year old presented to [facility] via EMS on 01/30/23 from [facility] with AMS, hypoxia, and hematuria in indewelling urinary catheter. Pt c/o back pain from injury a couple of weeks ago. Afib 80-90, RR 24, ETCO2 47, CBG 163. Sp02 on 8L 95%, normally 95% on 4L however found by EMS on no oxygen on arrival to scene. Hypotensive on arrival. Admitted for treatment of acute on chronic diastolic CHF and AKI on cKD PMH - T12-L1 fx with ORIF 11/23/22, morbid obesity, OSA, OHS, DM2, HTN, HLD, COPD, severe pulmonary hypertension, chronic diastolic and right-sided CHF, chronic 3 to 5 L oxygen requirement, permanent A-fib on Eliquis, migraine, arthritis, gout   OT comments  Pt in  bed upon therapy arrival. Initially declined participation in OOB activity or sitting on EOB to work on endurance and activity tolerance. Did mention vision issues that have not been mentioned before in prior therapy notes. Vision assessed during session and no visual deficits are present. Pt  mistakenly thought signs hanging on opposite wall were supposed to be perfectly lined up although they weren't. Pt then believed that his vision was distorted. Pt verbalized understanding.    Recommendations for follow up therapy are one component of a multi-disciplinary discharge planning process, led by the attending physician.  Recommendations may be updated based on patient status, additional functional criteria and insurance authorization.    Assistance Recommended at Discharge Frequent or constant Supervision/Assistance  Patient can return home with the following  A lot of help with walking and/or transfers;Two people to help with walking and/or transfers;Direct supervision/assist for medications management;Help with stairs or ramp for entrance   Equipment Recommendations  None recommended  by OT       Precautions / Restrictions Precautions Precautions: Fall Precaution Comments: limited by back pain,T12-L1 fx with ORIF 11/23/22, watch O2 SATs, 3-5L O2 required Restrictions Weight Bearing Restrictions: No              ADL either performed or assessed with clinical judgement     Vision Baseline Vision/History: 0 No visual deficits Ability to See in Adequate Light: 0 Adequate Patient Visual Report: Other (comment) (Pt reports that his vision is not normal. States that signs on wall across from bed on opposite wall do not look right. Pt with difficulty describing.) Vision Assessment?: No apparent visual deficits Additional Comments: Pt able to read menu for today's dinner specials without difficulty. Pt able to read calendar and white board with phone number to order food without difficulty.   Perception Perception Perception: Not tested   Praxis Praxis Praxis: Not tested    Cognition Arousal/Alertness: Awake/alert Behavior During Therapy: Flat affect Overall Cognitive Status: History of cognitive impairments - at baseline                        Pertinent Vitals/ Pain       Pain Assessment Pain Assessment: No/denies pain Pain Score: 0-No pain         Frequency    1X/week       Progress Toward Goals  OT Goals(current goals can now be found in the care plan section)  Progress towards OT goals: Not progressing toward goals - comment (pt declined out of bed activity d/t to fatigue)     Plan Discharge plan remains appropriate;Frequency remains appropriate  AM-PAC OT "6 Clicks" Daily Activity     Outcome Measure   Help from another person eating meals?: None Help from another person taking care of personal grooming?: A Little Help from another person toileting, which includes using toliet, bedpan, or urinal?: Total Help from another person bathing (including washing, rinsing, drying)?: A Lot Help from another person to put on and  taking off regular upper body clothing?: A Lot Help from another person to put on and taking off regular lower body clothing?: Total 6 Click Score: 13    End of Session    OT Visit Diagnosis: Muscle weakness (generalized) (M62.81);Other abnormalities of gait and mobility (R26.89)   Activity Tolerance No increased pain   Patient Left with call bell/phone within reach;in bed           Time: 7829-5621 OT Time Calculation (min): 18 min  Charges: OT General Charges $OT Visit: 1 Visit OT Treatments $Therapeutic Activity: 8-22 mins  Limmie Patricia, OTR/L,CBIS  Supplemental OT - [facility] and [facility] Secure Chat Preferred    [name] [name], Charisse March 02/09/2023, 4:27 PM

## 2023-02-09 NOTE — Progress Notes (Signed)
   02/09/23 2307  BiPAP/CPAP/SIPAP  $ Non-Invasive Home Ventilator  Subsequent  BiPAP/CPAP/SIPAP Pt Type Adult  BiPAP/CPAP/SIPAP Resmed  Mask Type Full face mask  Mask Size Medium  FiO2 (%) 40 %  Patient Home Equipment No  CPAP/SIPAP surface wiped down Yes  BiPAP/CPAP /SiPAP Vitals  SpO2 96 %

## 2023-02-09 NOTE — Plan of Care (Signed)

## 2023-02-09 NOTE — TOC Progression Note (Signed)
Transition of Care [facility]) - Progression Note    Patient Details  Name: [name] [name] MRN: [id number] Date of Birth: 1946/04/29  [facility]) CM/SW Contact  Delilah Shan, LCSWA Phone Number: 02/09/2023, 5:02 PM  Clinical Narrative:     Patients insurance authorization was denied for SNF. CSW LVM for patients daughter. CSW awaiting call back. CSW informed MD.  Expected Discharge Plan: Skilled Nursing Facility Barriers to Discharge: Continued Medical Work up  Expected Discharge Plan and Services In-house Referral: Clinical Social Work Discharge Planning Services:  (CSW) Post Acute Care Choice: Skilled Nursing Facility Living arrangements for the past 2 months: Skilled Nursing Facility                 DME Arranged: N/A DME Agency: NA                   Social Determinants of Health (SDOH) Interventions SDOH Screenings   Food Insecurity: No Food Insecurity (01/31/2023)  Housing: Low Risk  (01/31/2023)  Transportation Needs: No Transportation Needs (01/31/2023)  Utilities: Not At Risk (01/31/2023)  Depression (PHQ2-9): Low Risk  (10/08/2022)  Tobacco Use: Medium Risk (01/31/2023)    Readmission Risk Interventions    01/14/2023    1:35 PM  Readmission Risk Prevention Plan  Transportation Screening Complete  Medication Review (RN Care Manager) Complete  PCP or Specialist appointment within 3-5 days of discharge Complete  HRI or Home Care Consult Complete  Palliative Care Screening Not Applicable  Skilled Nursing Facility Not Applicable

## 2023-02-09 NOTE — Consult Note (Signed)
   [facility] CM Inpatient Consult   02/09/2023  [name] [name] [birth year] [id number]  Follow up:  Patient's disposition for SNF level of care for transitional needs post hospital.  Unit progression meeting discussion for barriers to care needs.  Patient is currently for a skilled nursing facility level of care for transition noted and discussed.  Discussed potential need for urology follow up with noted chronic foley issues.  Reviewed electronic medical record for potential documentation needs.  Plan:  If patient transitions to a skilled nursing facility then will request [facility] RN to follow.  For questions, please contact:  Charlesetta Shanks, RN BSN CCM [facility]  [phone] business mobile phone Toll free office [phone]  *Concierge Line  [phone] Fax number: [phone] [email or web address]

## 2023-02-09 NOTE — TOC Progression Note (Signed)
Transition of Care [facility]) - Progression Note    Patient Details  Name: NICOLAS ZIBELL MRN: [id number] Date of Birth: [birth year]  Transition of Care [facility]) CM/SW Contact  Leander Rams, LCSW Phone Number: 02/09/2023, 9:07 AM  Clinical Narrative:    CSW uploaded additional new PT notes to [employer] portal. Insurance still pending for [employer].   TOC will continue to follow.    Expected Discharge Plan: Skilled Nursing Facility Barriers to Discharge: Continued Medical Work up  Expected Discharge Plan and Services In-house Referral: Clinical Social Work Discharge Planning Services:  (CSW) Post Acute Care Choice: Skilled Nursing Facility Living arrangements for the past 2 months: Skilled Nursing Facility                 DME Arranged: N/A DME Agency: NA                   Social Determinants of Health (SDOH) Interventions SDOH Screenings   Food Insecurity: No Food Insecurity (01/31/2023)  Housing: Low Risk  (01/31/2023)  Transportation Needs: No Transportation Needs (01/31/2023)  Utilities: Not At Risk (01/31/2023)  Depression (PHQ2-9): Low Risk  (10/08/2022)  Tobacco Use: Medium Risk (01/31/2023)    Readmission Risk Interventions    01/14/2023    1:35 PM  Readmission Risk Prevention Plan  Transportation Screening Complete  Medication Review (RN Care Manager) Complete  PCP or Specialist appointment within 3-5 days of discharge Complete  HRI or Home Care Consult Complete  Palliative Care Screening Not Applicable  Skilled Nursing Facility Not Applicable   Oletta Lamas, MSW, LCSWA, LCASA Transitions of Care  Clinical Social Worker I

## 2023-02-09 NOTE — Progress Notes (Signed)
Physical Therapy Treatment Patient Details Name: [name] [name] MRN: [id number] DOB: 1946-04-13 Today's Date: 02/09/2023   History of Present Illness Pt is 76 year old presented to [facility] via EMS on 01/30/23 from [facility] with AMS, hypoxia, and hematuria in indewelling urinary catheter. Pt c/o back pain from injury a couple of weeks ago. Afib 80-90, RR 24, ETCO2 47, CBG 163. Sp02 on 8L 95%, normally 95% on 4L however found by EMS on no oxygen on arrival to scene. Hypotensive on arrival. Admitted for treatment of acute on chronic diastolic CHF and AKI on cKD PMH - T12-L1 fx with ORIF 11/23/22, morbid obesity, OSA, OHS, DM2, HTN, HLD, COPD, severe pulmonary hypertension, chronic diastolic and right-sided CHF, chronic 3 to 5 L oxygen requirement, permanent A-fib on Eliquis, migraine, arthritis, gout    PT Comments  The patient is cooperative and agreeable to PT session. He reports moderate back pain today. The patient continues to require physical assistance for bed mobility and transfers. Pre-gait activity performed with focus on increasing standing tolerance, posture, reinforcement of using rolling walker for balance, in preparation for ambulation. Standing tolerance today is limited to around 20 seconds with rolling walker for support. Sp02 remained in the 90's on 3 L02 with no significant shortness of breath noted with activity.  The patient is not at his baseline level of functional independence and would benefit from continued PT to maximize independence and decrease caregiver burden. Anticipate the need for frequent assistance required with ongoing PT recommended after this hospital stay.     Assistance Recommended at Discharge Frequent or constant Supervision/Assistance  If plan is discharge home, recommend the following:  Can travel by private vehicle    A lot of help with walking and/or transfers;A lot of help with bathing/dressing/bathroom;Assist for transportation   No  Equipment  Recommendations  None recommended by PT    Recommendations for Other Services       Precautions / Restrictions Precautions Precautions: Fall Precaution Comments: limited by back pain,T12-L1 fx with ORIF 11/23/22, watch O2 SATs, 3-5L O2 required Restrictions Weight Bearing Restrictions: No     Mobility  Bed Mobility Overal bed mobility: Needs Assistance Bed Mobility: Supine to Sit, Rolling, Sit to Supine     Supine to sit: Mod assist Sit to supine: Mod assist   General bed mobility comments: reinforcement of logroll technique for comfort. assistance required for upright trunk support sitting upright and assistance for BLE support to return to bed    Transfers Overall transfer level: Needs assistance Equipment used: Rolling walker (2 wheels) Transfers: Sit to/from Stand Sit to Stand: Mod assist           General transfer comment: lifting and lowering assistance provided with transfers. verbal cues for hand placement for safety with standing, however patient prefers to pull up on the rolling walker for support.    Ambulation/Gait             Pre-gait activities: reinforcement to use rolling walker for support in standing, upright posture, with standing tolerance limited to around 20 seconds. Sp02 remained in the 90's with standing on 3 L02 General Gait Details: the patient is unable to ambulate at this time secondary to back pain with standing. pre-gait activity performed with attempts at increasing standing tolerance in preparation for ambulation   Stairs             Wheelchair Mobility     Tilt Bed    Modified Rankin (Stroke Patients Only)  Balance Overall balance assessment: Needs assistance Sitting-balance support: Feet supported Sitting balance-[name] Scale: Fair Sitting balance - Comments: no loss of balance while seated. stand by assistance provided for safety   Standing balance support: Bilateral upper extremity supported, During  functional activity, Reliant on assistive device for balance Standing balance-[name] Scale: Poor Standing balance comment: using rolling walker for support in standing. no external support is required from therapist. standing without UE support not tested due pain and decreased standing activity tolerance                            Cognition Arousal/Alertness: Awake/alert Behavior During Therapy: Flat affect Overall Cognitive Status: History of cognitive impairments - at baseline                                 General Comments: Patient is able to follow single step commands without difficulty. impaired short term memory        Exercises      General Comments        Pertinent Vitals/Pain Pain Assessment Pain Assessment: Faces Faces Pain Scale: Hurts even more Pain Intervention(s): Monitored during session, Limited activity within patient's tolerance, Repositioned    Home Living                          Prior Function            PT Goals (current goals can now be found in the care plan section) Acute Rehab PT Goals Patient Stated Goal: to have less pain, to go home eventullay PT Goal Formulation: With patient Time For Goal Achievement: 01/28/23 Potential to Achieve Goals: Fair Progress towards PT goals: Progressing toward goals    Frequency    Min 1X/week      PT Plan Current plan remains appropriate    Co-evaluation              AM-PAC PT "6 Clicks" Mobility   Outcome Measure  Help needed turning from your back to your side while in a flat bed without using bedrails?: A Little Help needed moving from lying on your back to sitting on the side of a flat bed without using bedrails?: A Little Help needed moving to and from a bed to a chair (including a wheelchair)?: A Lot Help needed standing up from a chair using your arms (e.g., wheelchair or bedside chair)?: A Lot Help needed to walk in hospital room?: A Lot Help  needed climbing 3-5 steps with a railing? : Total 6 Click Score: 13    End of Session Equipment Utilized During Treatment: Oxygen Activity Tolerance: Patient tolerated treatment well;Patient limited by pain Patient left: in bed;with call bell/phone within reach Nurse Communication: Mobility status PT Visit Diagnosis: Other abnormalities of gait and mobility (R26.89);History of falling (Z91.81);Muscle weakness (generalized) (M62.81);Pain     Time: 1610-9604 PT Time Calculation (min) (ACUTE ONLY): 19 min  Charges:    $Therapeutic Activity: 8-22 mins PT General Charges $$ ACUTE PT VISIT: 1 Visit                     Donna Bernard, PT, MPT    Ina Homes 02/09/2023, 8:55 AM

## 2023-02-10 LAB — BASIC METABOLIC PANEL
Anion gap: 9 (ref 5–15)
BUN: 32 mg/dL — ABNORMAL HIGH (ref 8–23)
CO2: 30 mmol/L (ref 22–32)
Calcium: 9.3 mg/dL (ref 8.9–10.3)
Chloride: 100 mmol/L (ref 98–111)
Creatinine, Ser: 1.6 mg/dL — ABNORMAL HIGH (ref 0.61–1.24)
GFR, Estimated: 44 mL/min — ABNORMAL LOW (ref >60)
Glucose, Bld: 114 mg/dL — ABNORMAL HIGH (ref 70–99)
Potassium: 4 mmol/L (ref 3.5–5.1)
Sodium: 139 mmol/L (ref 135–145)

## 2023-02-10 LAB — GLUCOSE, CAPILLARY
Glucose-Capillary: 110 mg/dL — ABNORMAL HIGH (ref 70–99)
Glucose-Capillary: 142 mg/dL — ABNORMAL HIGH (ref 70–99)
Glucose-Capillary: 157 mg/dL — ABNORMAL HIGH (ref 70–99)
Glucose-Capillary: 100 mg/dL — ABNORMAL HIGH (ref 70–99)

## 2023-02-10 MED ORDER — SACCHAROMYCES BOULARDII 250 MG PO CAPS: 250.0000 mg | ORAL_CAPSULE | Freq: Two times a day (BID) | ORAL | 0 refills | Status: AC

## 2023-02-10 MED ORDER — TORSEMIDE 20 MG PO TABS: 20.0000 mg | ORAL_TABLET | Freq: Every day | ORAL | Status: DC

## 2023-02-10 MED ORDER — POLYETHYLENE GLYCOL 3350 17 G PO PACK: 17.0000 g | PACK | Freq: Every day | ORAL | 0 refills | Status: AC | PRN

## 2023-02-10 MED ORDER — SPIRONOLACTONE 25 MG PO TABS: 12.5000 mg | ORAL_TABLET | Freq: Every day | ORAL | Status: DC

## 2023-02-10 NOTE — TOC Progression Note (Signed)
Transition of Care [facility]) - Progression Note    Patient Details  Name: [name] [name] MRN: [id number] Date of Birth: June 11, 1946  [facility] CM/SW Contact  Delilah Shan, LCSWA Phone Number: 02/10/2023, 12:10 PM  Clinical Narrative:     CSW received call back from patients daughter and informed her that patients insurance was denied for SNF placement. Patients daughter would like to appeal. CSW informed patients daughter that insurance is closed today but that CSW will follow up tomorrow to get her information to start the appeal. Patients daughter thanked CSW. CSW informed MD. CSW will continue to follow and assist with patients dc planning needs.  Expected Discharge Plan: Skilled Nursing Facility Barriers to Discharge: Continued Medical Work up  Expected Discharge Plan and Services In-house Referral: Clinical Social Work Discharge Planning Services:  (CSW) Post Acute Care Choice: Skilled Nursing Facility Living arrangements for the past 2 months: Skilled Nursing Facility                 DME Arranged: N/A DME Agency: NA                   Social Determinants of Health (SDOH) Interventions SDOH Screenings   Food Insecurity: No Food Insecurity (01/31/2023)  Housing: Low Risk  (01/31/2023)  Transportation Needs: No Transportation Needs (01/31/2023)  Utilities: Not At Risk (01/31/2023)  Depression (PHQ2-9): Low Risk  (10/08/2022)  Tobacco Use: Medium Risk (01/31/2023)    Readmission Risk Interventions    01/14/2023    1:35 PM  Readmission Risk Prevention Plan  Transportation Screening Complete  Medication Review (RN Care Manager) Complete  PCP or Specialist appointment within 3-5 days of discharge Complete  HRI or Home Care Consult Complete  Palliative Care Screening Not Applicable  Skilled Nursing Facility Not Applicable

## 2023-02-10 NOTE — Discharge Summary (Signed)
Physician Discharge Summary  [name] [name]:[id number] DOB: [birth year] DOA: 01/30/2023  PCP: Corwin Levins, MD  Admit date: 01/30/2023 Discharge date: 02/11/2023  Time spent: 45 minutes  Recommendations for Outpatient Follow-up:  SNF for short-term rehab Cardiology Dr. Jens Som in 2 weeks, please titrate torsemide dose as needed, discharge weight 225 LB (102 kg) BMP in 1 week Chronic Foley, routine catheter care, recommend urology follow-up in 1 month, foley changed 7/3   Discharge Diagnoses:  Active Problems:   Acute on chronic diastolic CHF (congestive heart failure) (HCC) Severe hypoalbuminemia Permanent A-fib Acute on chronic hypoxic respiratory failure   Acute kidney injury superimposed on chronic kidney disease (HCC)   Essential hypertension   Atrial fibrillation, chronic (HCC)   BPH (benign prostatic hyperplasia)   Gout   Type 2 diabetes mellitus with hyperlipidemia (HCC)   Pressure injury of skin   Class 2 obesity   Sepsis secondary to UTI [facility])   Discharge Condition: Low-sodium, diabetic  Diet recommendation: Improved Filed Weights   02/08/23 0553 02/09/23 0400 02/10/23 0400  Weight: 105.2 kg 105.2 kg 105.2 kg    History of present illness:  89 yom w/ diastolic CHF, migraine, T2DM,hypertension, dyslipidemia, GERD, OSA and pulmonary hypertension,presented to ED W/ acute onset of worsening dyspnea with associated orthopnea and paroxysmal nocturnal dyspnea and lower extremity edema. -EM[facility] called [facility] because patient had AMS, hypoxia -In the ED he was hypoxic, hypotensive, placed on BiPAP, diuretics, abnormal UA, creatinine 2.0, albumin less than 1.5 -portable chest x-ray showed finding consistent with CHF, CT head no acute finding.  Renal ultrasound increased echogenicity ABG suggestive of respiratory acidosis.  Hospital Course:   Acute on chronic diastolic CHF Severe hypoalbuminemia and third spacing -Echo with EF 60 to 65%, mild LVH,  RV systolic function preserved, LA and RA with moderate dilatation.  -diuresed with IV lasix, 6/26 given albumin IV, volume status has improved he is 15.2 L negative, weight down 16 LB -continue metoprolol, spironolactone,  -Loop diuretics were held temporarily with soft blood pressures, torsemide started 7/3 instead of Lasix given severe hypoalbuminemia -No SGLT2i due to risk of urine infection, urinary retention, foley catheter in place.    Acute on chronic hypercapnic and hypoxemic respiratory failure Acute metabolic encephalopathy, resolved.  -Mental status has improved  -Multifactorial secondary to hypoxia, hypercarbia, gabapentin   AKI on CKD 3a Hypokalemia -Creatinine stable, started torsemide yesterday -Continue spironolactone.    Sepsis secondary to UTI (HCC) Culture from 01/12/23 with 20,000 CFU klebsiella pneumonia.  Patient has been afebrile and no leukocytosis.  Completed oral cephalexin total of 3 days.    Essential hypertension Continue metoprolol.    Atrial fibrillation, chronic (HCC) Continue  metoprolol and apixaban.    BPH (benign prostatic hyperplasia) Continue with flomax, he has foley catheter in place, he has failed voiding trials.  Follow up with Urology as outpatient.  -foley changed 7/3   Gout Continue allopurinol. -For back pain continue as needed tramadol. Gabapentin has been discontinued due to somnolence.    Type 2 diabetes mellitus with hyperlipidemia (HCC) -CBG's are stable, glipizide resumed  Discharge Exam: Vitals:   02/10/23 0835 02/10/23 1157  BP: 99/69 103/70  Pulse: 84 87  Resp: 19 18  Temp: (!) 97 F (36.1 C) 97.9 F (36.6 C)  SpO2: 98% 94%    Chronically ill male laying in bed, AAO x 2, mild cognitive deficits HEENT: No JVD CVS: S1-S2, regular rhythm Lungs: Decreased breath sounds to bases  Abdomen: Soft, nontender, bowel sounds present Extremities: Trace edema Discharge Instructions    Allergies as of 02/10/2023        Reactions   Codeine    Altered mental status "goofy"   Heparin Rash    Abdominal rash 01/2013        Medication List     STOP taking these medications    diltiazem 120 MG 24 hr capsule Commonly known as: Cardizem CD       TAKE these medications    acetaminophen 500 MG tablet Commonly known as: TYLENOL Take 500-1,000 mg by mouth every 6 (six) hours as needed for moderate pain.   allopurinol 100 MG tablet Commonly known as: ZYLOPRIM TAKE 1 TABLET DAILY   atorvastatin 20 MG tablet Commonly known as: LIPITOR TAKE 1 TABLET DAILY What changed: when to take this   cholecalciferol 25 MCG (1000 UNIT) tablet Commonly known as: VITAMIN D3 Take 2,000 Units by mouth daily.   Eliquis 5 MG Tabs tablet Generic drug: apixaban TAKE 1 TABLET TWICE A DAY What changed: how much to take   glipiZIDE 10 MG 24 hr tablet Commonly known as: GLUCOTROL XL Take 1 tablet (10 mg total) by mouth daily with breakfast.   Lancets Misc Use as directed once daily  E11.9   metoprolol tartrate 25 MG tablet Commonly known as: LOPRESSOR TAKE 1 TABLET(25 MG) BY MOUTH TWICE DAILY What changed: See the new instructions.   OXYGEN Inhale 3-5 L into the lungs daily. 3L Daily  4L Resting  5L Exertion   polyethylene glycol 17 g packet Commonly known as: MIRALAX / GLYCOLAX Take 17 g by mouth daily as needed.   saccharomyces boulardii 250 MG capsule Commonly known as: Florastor Take 1 capsule (250 mg total) by mouth 2 (two) times daily for 14 days.   spironolactone 25 MG tablet Commonly known as: ALDACTONE Take 0.5 tablets (12.5 mg total) by mouth daily. Start taking on: February 11, 2023   tamsulosin 0.4 MG Caps capsule Commonly known as: FLOMAX Take 1 capsule (0.4 mg total) by mouth daily after supper.   torsemide 20 MG tablet Commonly known as: DEMADEX Take 1 tablet (20 mg total) by mouth daily. Start taking on: February 11, 2023   traMADol 50 MG tablet Commonly known as: ULTRAM Take 50 mg  by mouth every 6 (six) hours as needed for moderate pain.       Allergies  Allergen Reactions   Codeine     Altered mental status "goofy"   Heparin Rash     Abdominal rash 01/2013    Follow-up Information     [facility] Follow up.   Specialty: Cardiology Why: [facility] location - Please come to the office on Wednesday, July 3rd for bloodwork (basic metabolic panel) between 8am and 4pm. The lab does take a lunch around 12-1. You do not need to be fasting. - The office will otherwise call you to arrange follow-up. Contact information: [address] [id number] mc [place] [address] [phone]                 The results of significant diagnostics from this hospitalization (including imaging, microbiology, ancillary and laboratory) are listed below for reference.    Significant Diagnostic Studies: US Renal  Result Date: 01/30/2023 CLINICAL DATA:  aki EXAM: RENAL / URINARY TRACT ULTRASOUND COMPLETE COMPARISON:  CT abdomen pelvis 01/12/2024 FINDINGS: Right Kidney: Renal measurements: 10.4 x 5 x 4.7 cm =  volume: 127 mL. Renal cortical scarring. Echogenicity increased. No mass or hydronephrosis visualized. Left Kidney: Renal measurements: 13.5 x 6.2 x 7 cm = volume: 306 mL. Echogenicity increased. Multiple simple renal cysts as well as a single minimally complex thinly septated cyst noted-no further follow-up indicated. No solid mass or hydronephrosis visualized. Urinary bladder: Appears normal for degree of bladder distention. Other: None. IMPRESSION: Increased renal echogenicity suggestive of renal parenchymal disease. Electronically Signed   By: Tish Frederickson M.D.   On: 01/30/2023 23:32   CT Head Wo Contrast  Result Date: 01/30/2023 CLINICAL DATA:  Mental status change, unknown cause EXAM: CT HEAD WITHOUT CONTRAST TECHNIQUE: Contiguous axial images were obtained from the base of the skull through  the vertex without intravenous contrast. RADIATION DOSE REDUCTION: This exam was performed according to the departmental dose-optimization program which includes automated exposure control, adjustment of the mA and/or kV according to patient size and/or use of iterative reconstruction technique. COMPARISON:  CT head 01/12/2023 FINDINGS: Brain: Cerebral ventricle sizes are concordant with the degree of cerebral volume loss. Patchy and confluent areas of decreased attenuation are noted throughout the deep and periventricular white matter of the cerebral hemispheres bilaterally, compatible with chronic microvascular ischemic disease. No evidence of large-territorial acute infarction. No parenchymal hemorrhage. No mass lesion. No extra-axial collection. No mass effect or midline shift. No hydrocephalus. Basilar cisterns are patent. Vascular: No hyperdense vessel. Skull: No acute fracture or focal lesion. Sinuses/Orbits: Paranasal sinuses and mastoid air cells are clear. Bilateral lens replacement. Otherwise the orbits are unremarkable. Other: None. IMPRESSION: No acute intracranial abnormality. Electronically Signed   By: Tish Frederickson M.D.   On: 01/30/2023 22:07   DG Chest Port 1 View  Result Date: 01/30/2023 CLINICAL DATA:  Sepsis EXAM: PORTABLE CHEST 1 VIEW COMPARISON:  01/14/2023 FINDINGS: Single frontal view of the chest demonstrates an enlarged cardiac silhouette. There is central vascular congestion, with patchy perihilar airspace disease and small bilateral pleural effusions. No pneumothorax. No acute bony abnormalities. IMPRESSION: 1. Constellation of findings most consistent with congestive heart failure. Electronically Signed   By: Sharlet Salina M.D.   On: 01/30/2023 20:31   DG Chest 1 View  Result Date: 01/14/2023 CLINICAL DATA:  Shortness of breath. EXAM: CHEST  1 VIEW COMPARISON:  January 12, 2023. FINDINGS: Stable cardiomegaly. Hypoinflation of the lungs is noted with minimal bibasilar subsegmental  atelectasis and small pleural effusions. Bony thorax is unremarkable. IMPRESSION: Hypoinflation of the lungs with minimal bibasilar subsegmental atelectasis and small pleural effusions. Electronically Signed   By: Lupita Raider M.D.   On: 01/14/2023 08:54   CT CHEST ABDOMEN PELVIS W CONTRAST  Result Date: 01/12/2023 CLINICAL DATA:  Sepsis, fever and chills. EXAM: CT CHEST, ABDOMEN, AND PELVIS WITH CONTRAST TECHNIQUE: Multidetector CT imaging of the chest, abdomen and pelvis was performed following the standard protocol during bolus administration of intravenous contrast. RADIATION DOSE REDUCTION: This exam was performed according to the departmental dose-optimization program which includes automated exposure control, adjustment of the mA and/or kV according to patient size and/or use of iterative reconstruction technique. CONTRAST:  70mL OMNIPAQUE IOHEXOL 300 MG/ML  SOLN COMPARISON:  None Available. FINDINGS: CT CHEST FINDINGS Cardiovascular: The heart is enlarged and there is no pericardial effusion. A few scattered coronary artery calcifications are noted. There is atherosclerotic calcification of the aorta without evidence of aneurysm. The pulmonary trunk is normal in caliber. Mediastinum/Nodes: No mediastinal or axillary lymphadenopathy. A prominent lymph node is present at the right hilum measuring 1.2 cm.  The thyroid gland, trachea, and esophagus are within normal limits. Lungs/Pleura: Bronchial wall thickening is noted in the lower lobes bilaterally with patchy atelectasis or infiltrate. No effusion or pneumothorax. Musculoskeletal: Degenerative changes are present in the thoracic spine. No acute fracture. Spinal fusion hardware is noted in the thoracolumbar spine. CT ABDOMEN PELVIS FINDINGS Hepatobiliary: Subcentimeter hypodensities are present in the liver, most likely representing cysts or hemangiomas. No biliary ductal dilatation. The gallbladder is without stones. Pancreas: Unremarkable. No  pancreatic ductal dilatation or surrounding inflammatory changes. Spleen: Normal in size without focal abnormality. Adrenals/Urinary Tract: Fat attenuation nodules are present in the left adrenal gland measuring up to 1.3 cm, compatible with myelolipomas. There is a 1.8 cm nodule in the left adrenal gland measuring 1.83 cm with attenuation of 8 Hounsfield units, likely adenoma. The right adrenal gland is within normal limits. The kidneys enhance symmetrically. Multiple cysts are noted in the left kidney. No renal calculus bilaterally. No obstructive uropathy on the right. There is mild hydroureteronephrosis on the left with perinephric and periureteral fat stranding. No significant bladder wall thickening. There is diffuse perivesicular fat stranding. Stomach/Bowel: Stomach is within normal limits. Appendix is not seen. No evidence of bowel wall thickening, distention, or inflammatory changes. No free air or pneumatosis. A few scattered diverticula are present along the colon without evidence of diverticulitis. Vascular/Lymphatic: Aortic atherosclerosis. Prominent lymph nodes are present in the retroperitoneum in the periaortic space and in the perivesicular region which may be reactive. Reproductive: The prostate gland is mildly enlarged. Other: No abdominopelvic ascites. Musculoskeletal: Degenerative changes are present in the lumbar spine and spinal fusion hardware is noted. A cystic structure is present in the subcutaneous tissues over the gluteal region on the left posteriorly, possible large sebaceous cyst. IMPRESSION: 1. Mild hydroureteronephrosis on the left with no obstructing stone. There is perinephric and periureteral fat stranding on the left with perivesicular fat stranding about the bladder and local lymphadenopathy, which may be infectious or inflammatory. 2. Bronchial wall thickening in the lower lobes bilaterally with mild atelectasis or infiltrate. 3. Left adrenal nodules, compatible with adenoma  and myelolipomas. 4. Coronary artery calcifications. 5. Aortic atherosclerosis. Electronically Signed   By: Thornell Sartorius M.D.   On: 01/12/2023 22:29   CT Head Wo Contrast  Result Date: 01/12/2023 CLINICAL DATA:  Fever and chills.  Mental status change EXAM: CT HEAD WITHOUT CONTRAST TECHNIQUE: Contiguous axial images were obtained from the base of the skull through the vertex without intravenous contrast. RADIATION DOSE REDUCTION: This exam was performed according to the departmental dose-optimization program which includes automated exposure control, adjustment of the mA and/or kV according to patient size and/or use of iterative reconstruction technique. COMPARISON:  CT head 11/18/2022 FINDINGS: Brain: No intracranial hemorrhage, mass effect, or evidence of acute infarct. No hydrocephalus. No extra-axial fluid collection. Generalized cerebral atrophy. Ill-defined hypoattenuation within the cerebral white matter is nonspecific but consistent with chronic small vessel ischemic disease. Vascular: No hyperdense vessel. Intracranial arterial calcification. Skull: No fracture or focal lesion. Sinuses/Orbits: No acute finding. Paranasal sinuses and mastoid air cells are well aerated. Other: None. IMPRESSION: 1. No evidence of acute intracranial abnormality. Electronically Signed   By: Minerva Fester M.D.   On: 01/12/2023 22:20   DG Chest Port 1 View  Result Date: 01/12/2023 CLINICAL DATA:  Shortness of breath EXAM: PORTABLE CHEST 1 VIEW COMPARISON:  Chest radiograph dated 11/22/2022 FINDINGS: Low lung volumes. Bibasilar patchy opacities. Small bilateral pleural effusions. No pneumothorax. Similar enlarged cardiomediastinal silhouette. Partially imaged  lumbar spinal fixation hardware appears intact. IMPRESSION: 1. Low lung volumes with bibasilar patchy opacities, may represent atelectasis, aspiration, or pneumonia. 2. Small bilateral pleural effusions. 3. Similar cardiomegaly. Electronically Signed   By: Agustin Cree  M.D.   On: 01/12/2023 21:06    Microbiology: No results found for this or any previous visit (from the past 240 hour(s)).   Labs: Basic Metabolic Panel: Recent Labs  Lab 02/04/23 0100 02/05/23 0040 02/07/23 0053 02/09/23 0024 02/10/23 0023  NA 140 138 138 137 139  K 4.1 3.9 4.7 4.3 4.0  CL 96* 94* 94* 98 100  CO2 36* 36* 35* 31 30  GLUCOSE 111* 114* 135* 135* 114*  BUN 30* 31* 33* 32* 32*  CREATININE 1.54* 1.47* 1.61* 1.54* 1.60*  CALCIUM 9.0 9.1 9.4 9.4 9.3  MG 2.0  --   --   --   --   PHOS  --   --  2.0*  --   --    Liver Function Tests: Recent Labs  Lab 02/07/23 0053  ALBUMIN 2.3*   No results for input(s): "LIPASE", "AMYLASE" in the last 168 hours. No results for input(s): "AMMONIA" in the last 168 hours. CBC: No results for input(s): "WBC", "NEUTROABS", "HGB", "HCT", "MCV", "PLT" in the last 168 hours. Cardiac Enzymes: No results for input(s): "CKTOTAL", "CKMB", "CKMBINDEX", "TROPONINI" in the last 168 hours. BNP: BNP (last 3 results) Recent Labs    11/22/22 0342 01/30/23 2220  BNP 158.4* 230.3*    ProBNP (last 3 results) No results for input(s): "PROBNP" in the last 8760 hours.  CBG: Recent Labs  Lab 02/09/23 1124 02/09/23 1706 02/09/23 2124 02/10/23 0632 02/10/23 1108  GLUCAP 118* 139* 135* 110* 142*       Signed:  Zannie Cove MD.  [facility] 02/10/2023, 1:46 PM

## 2023-02-10 NOTE — Progress Notes (Signed)
   02/10/23 2338  BiPAP/CPAP/SIPAP  BiPAP/CPAP/SIPAP Pt Type Adult  BiPAP/CPAP/SIPAP Resmed  Mask Type Full face mask  Mask Size Medium  Respiratory Rate 17 breaths/min  Flow Rate 4 lpm  Patient Home Equipment No  Auto Titrate Yes

## 2023-02-10 NOTE — Progress Notes (Signed)
  Patient seen and examined, no changes from my note yesterday, remains chronically ill but stable, awaiting SNF for rehab -[facility] working with family, plan to appeal insurance  Zannie Cove, MD

## 2023-02-11 ENCOUNTER — Encounter (HOSPITAL_COMMUNITY): Payer: Self-pay | Admitting: Family Medicine

## 2023-02-11 DIAGNOSIS — J9602 Acute respiratory failure with hypercapnia: Secondary | ICD-10-CM | POA: Diagnosis not present

## 2023-02-11 DIAGNOSIS — J9601 Acute respiratory failure with hypoxia: Secondary | ICD-10-CM | POA: Diagnosis not present

## 2023-02-11 LAB — GLUCOSE, CAPILLARY
Glucose-Capillary: 130 mg/dL — ABNORMAL HIGH (ref 70–99)
Glucose-Capillary: 104 mg/dL — ABNORMAL HIGH (ref 70–99)
Glucose-Capillary: 116 mg/dL — ABNORMAL HIGH (ref 70–99)
Glucose-Capillary: 149 mg/dL — ABNORMAL HIGH (ref 70–99)

## 2023-02-11 LAB — CBC
HCT: 35.1 % — ABNORMAL LOW (ref 39.0–52.0)
Hemoglobin: 10.8 g/dL — ABNORMAL LOW (ref 13.0–17.0)
MCH: 28.3 pg (ref 26.0–34.0)
MCHC: 30.8 g/dL (ref 30.0–36.0)
MCV: 91.9 fL (ref 80.0–100.0)
Platelets: 235 10*3/uL (ref 150–400)
RBC: 3.82 MIL/uL — ABNORMAL LOW (ref 4.22–5.81)
RDW: 17.6 % — ABNORMAL HIGH (ref 11.5–15.5)
WBC: 10 10*3/uL (ref 4.0–10.5)
nRBC: 0 % (ref 0.0–0.2)

## 2023-02-11 LAB — BASIC METABOLIC PANEL
Anion gap: 10 (ref 5–15)
BUN: 39 mg/dL — ABNORMAL HIGH (ref 8–23)
CO2: 29 mmol/L (ref 22–32)
Calcium: 9.5 mg/dL (ref 8.9–10.3)
Chloride: 102 mmol/L (ref 98–111)
Creatinine, Ser: 1.75 mg/dL — ABNORMAL HIGH (ref 0.61–1.24)
GFR, Estimated: 40 mL/min — ABNORMAL LOW (ref >60)
Glucose, Bld: 108 mg/dL — ABNORMAL HIGH (ref 70–99)
Potassium: 4.1 mmol/L (ref 3.5–5.1)
Sodium: 141 mmol/L (ref 135–145)

## 2023-02-11 NOTE — TOC Progression Note (Signed)
Transition of Care [facility]) - Progression Note    Patient Details  Name: [name] [name] MRN: [id number] Date of Birth: [birth year]  [facility] CM/SW Contact  Leander Rams, LCSW Phone Number: 02/11/2023, 9:26 AM  Clinical Narrative:    Pt insurance denied SNF. CSW called pt daughter [name] to provide the phone number to complete appeal. [phone].  TOC will continue to follow.    Expected Discharge Plan: Skilled Nursing Facility Barriers to Discharge: Continued Medical Work up  Expected Discharge Plan and Services In-house Referral: Clinical Social Work Discharge Planning Services:  (CSW) Post Acute Care Choice: Skilled Nursing Facility Living arrangements for the past 2 months: Skilled Nursing Facility                 DME Arranged: N/A DME Agency: NA                   Social Determinants of Health (SDOH) Interventions SDOH Screenings   Food Insecurity: No Food Insecurity (01/31/2023)  Housing: Low Risk  (01/31/2023)  Transportation Needs: No Transportation Needs (01/31/2023)  Utilities: Not At Risk (01/31/2023)  Depression (PHQ2-9): Low Risk  (10/08/2022)  Tobacco Use: Medium Risk (01/31/2023)    Readmission Risk Interventions    01/14/2023    1:35 PM  Readmission Risk Prevention Plan  Transportation Screening Complete  Medication Review (RN Care Manager) Complete  PCP or Specialist appointment within 3-5 days of discharge Complete  HRI or Home Care Consult Complete  Palliative Care Screening Not Applicable  Skilled Nursing Facility Not Applicable   Oletta Lamas, MSW, LCSWA, LCASA Transitions of Care  Clinical Social Worker I

## 2023-02-12 LAB — BASIC METABOLIC PANEL
Anion gap: 8 (ref 5–15)
BUN: 43 mg/dL — ABNORMAL HIGH (ref 8–23)
CO2: 31 mmol/L (ref 22–32)
Calcium: 9.4 mg/dL (ref 8.9–10.3)
Chloride: 101 mmol/L (ref 98–111)
Creatinine, Ser: 1.78 mg/dL — ABNORMAL HIGH (ref 0.61–1.24)
GFR, Estimated: 39 mL/min — ABNORMAL LOW (ref >60)
Glucose, Bld: 118 mg/dL — ABNORMAL HIGH (ref 70–99)
Potassium: 4.1 mmol/L (ref 3.5–5.1)
Sodium: 140 mmol/L (ref 135–145)

## 2023-02-12 LAB — GLUCOSE, CAPILLARY
Glucose-Capillary: 128 mg/dL — ABNORMAL HIGH (ref 70–99)
Glucose-Capillary: 146 mg/dL — ABNORMAL HIGH (ref 70–99)
Glucose-Capillary: 178 mg/dL — ABNORMAL HIGH (ref 70–99)
Glucose-Capillary: 159 mg/dL — ABNORMAL HIGH (ref 70–99)

## 2023-02-12 NOTE — Progress Notes (Signed)
   02/11/23 2323  BiPAP/CPAP/SIPAP  $ Non-Invasive Ventilator  Non-Invasive Vent Subsequent  BiPAP/CPAP/SIPAP Pt Type Adult  BiPAP/CPAP/SIPAP Resmed  Mask Type Full face mask  Mask Size Medium  EPAP  (8-18)  Flow Rate 4 lpm  Patient Home Equipment No  Auto Titrate Yes  BiPAP/CPAP /SiPAP Vitals  Resp (!) 23  MEWS Score/Color  MEWS Score 1  MEWS Score Color [name]

## 2023-02-12 NOTE — Progress Notes (Signed)
Patient seen, no changes from my discharge summary yesterday, currently awaiting appeal for SNF -TOC following  Zannie Cove, MD

## 2023-02-13 LAB — GLUCOSE, CAPILLARY
Glucose-Capillary: 135 mg/dL — ABNORMAL HIGH (ref 70–99)
Glucose-Capillary: 153 mg/dL — ABNORMAL HIGH (ref 70–99)
Glucose-Capillary: 106 mg/dL — ABNORMAL HIGH (ref 70–99)
Glucose-Capillary: 148 mg/dL — ABNORMAL HIGH (ref 70–99)
Glucose-Capillary: 129 mg/dL — ABNORMAL HIGH (ref 70–99)

## 2023-02-13 LAB — BASIC METABOLIC PANEL
Anion gap: 9 (ref 5–15)
BUN: 43 mg/dL — ABNORMAL HIGH (ref 8–23)
CO2: 29 mmol/L (ref 22–32)
Calcium: 9.7 mg/dL (ref 8.9–10.3)
Chloride: 103 mmol/L (ref 98–111)
Creatinine, Ser: 1.54 mg/dL — ABNORMAL HIGH (ref 0.61–1.24)
GFR, Estimated: 46 mL/min — ABNORMAL LOW (ref >60)
Glucose, Bld: 132 mg/dL — ABNORMAL HIGH (ref 70–99)
Potassium: 4.1 mmol/L (ref 3.5–5.1)
Sodium: 141 mmol/L (ref 135–145)

## 2023-02-13 NOTE — Plan of Care (Signed)

## 2023-02-13 NOTE — Progress Notes (Signed)
Patient seen and examined, no changes from my note yesterday -Has been medically stable awaiting appeal decision for SNF -TOC following  Zannie Cove, MD

## 2023-02-13 NOTE — TOC Progression Note (Signed)
Transition of Care [facility]) - Progression Note    Patient Details  Name: [name] [name] MRN: [id number] Date of Birth: March 25, 1946  Transition of Care [facility]) CM/SW Contact  Patrice Paradise, LCSW Phone Number: 02/13/2023, 8:17 AM  Clinical Narrative:     CSW called [employer] to check on pt's appeal and was notified it is still pending. Typically it can take up to 72 hours for a decision.  TOC team will continue to assist with discharge planning needs.   Expected Discharge Plan: Skilled Nursing Facility Barriers to Discharge: Continued Medical Work up  Expected Discharge Plan and Services In-house Referral: Clinical Social Work Discharge Planning Services:  (CSW) Post Acute Care Choice: Skilled Nursing Facility Living arrangements for the past 2 months: Skilled Nursing Facility                 DME Arranged: N/A DME Agency: NA                   Social Determinants of Health (SDOH) Interventions SDOH Screenings   Food Insecurity: No Food Insecurity (01/31/2023)  Housing: Low Risk  (02/11/2023)  Transportation Needs: No Transportation Needs (02/11/2023)  Utilities: Not At Risk (01/31/2023)  Alcohol Screen: Low Risk  (02/11/2023)  Depression (PHQ2-9): Low Risk  (10/08/2022)  Financial Resource Strain: Low Risk  (02/11/2023)  Tobacco Use: Medium Risk (02/11/2023)    Readmission Risk Interventions   Row Labels 01/14/2023    1:35 PM  Readmission Risk Prevention Plan   Section Header. No data exists in this row.   Transportation Screening   Complete  Medication Review [employer])   Complete  PCP or Specialist appointment within 3-5 days of discharge   Complete  HRI or Home Care Consult   Complete  Palliative Care Screening   Not Applicable  Skilled Nursing Facility   Not Applicable

## 2023-02-14 LAB — BASIC METABOLIC PANEL
Anion gap: 8 (ref 5–15)
BUN: 48 mg/dL — ABNORMAL HIGH (ref 8–23)
CO2: 30 mmol/L (ref 22–32)
Calcium: 9.6 mg/dL (ref 8.9–10.3)
Chloride: 102 mmol/L (ref 98–111)
Creatinine, Ser: 1.87 mg/dL — ABNORMAL HIGH (ref 0.61–1.24)
GFR, Estimated: 37 mL/min — ABNORMAL LOW (ref >60)
Glucose, Bld: 119 mg/dL — ABNORMAL HIGH (ref 70–99)
Potassium: 4 mmol/L (ref 3.5–5.1)
Sodium: 140 mmol/L (ref 135–145)

## 2023-02-14 LAB — GLUCOSE, CAPILLARY
Glucose-Capillary: 119 mg/dL — ABNORMAL HIGH (ref 70–99)
Glucose-Capillary: 119 mg/dL — ABNORMAL HIGH (ref 70–99)
Glucose-Capillary: 150 mg/dL — ABNORMAL HIGH (ref 70–99)
Glucose-Capillary: 131 mg/dL — ABNORMAL HIGH (ref 70–99)

## 2023-02-14 MED ORDER — TORSEMIDE 20 MG PO TABS
20.0000 mg | ORAL_TABLET | Freq: Every day | ORAL | Status: DC
  Administered 2023-02-15: 20 mg via ORAL
  Filled 2023-02-14: qty 1

## 2023-02-14 NOTE — Progress Notes (Signed)
   02/14/23 2314  BiPAP/CPAP/SIPAP  $ Non-Invasive Home Ventilator  Subsequent  BiPAP/CPAP/SIPAP Pt Type Adult  BiPAP/CPAP/SIPAP Resmed  Mask Type Full face mask  Mask Size Medium  Respiratory Rate 16 breaths/min  IPAP  (8 to 18)  Pressure Support 6 cmH20  Flow Rate 4 lpm  Patient Home Equipment No  Auto Titrate Yes   Pt resting comfortably on bipap w/no noted distress.  RT will continue to monitor

## 2023-02-14 NOTE — Progress Notes (Signed)
Seen and examined, eating breakfast, denies any complaints -Creatinine slightly higher will hold today's dose of torsemide and resume tomorrow -Has been medically stable awaiting decision regarding appeal for SNF  Zannie Cove, MD

## 2023-02-15 DIAGNOSIS — E119 Type 2 diabetes mellitus without complications: Secondary | ICD-10-CM | POA: Diagnosis not present

## 2023-02-15 DIAGNOSIS — I10 Essential (primary) hypertension: Secondary | ICD-10-CM | POA: Diagnosis not present

## 2023-02-15 DIAGNOSIS — Z79899 Other long term (current) drug therapy: Secondary | ICD-10-CM | POA: Diagnosis not present

## 2023-02-15 DIAGNOSIS — J9610 Chronic respiratory failure, unspecified whether with hypoxia or hypercapnia: Secondary | ICD-10-CM | POA: Diagnosis not present

## 2023-02-15 DIAGNOSIS — E1122 Type 2 diabetes mellitus with diabetic chronic kidney disease: Secondary | ICD-10-CM | POA: Diagnosis not present

## 2023-02-15 DIAGNOSIS — I130 Hypertensive heart and chronic kidney disease with heart failure and stage 1 through stage 4 chronic kidney disease, or unspecified chronic kidney disease: Secondary | ICD-10-CM | POA: Diagnosis not present

## 2023-02-15 DIAGNOSIS — E559 Vitamin D deficiency, unspecified: Secondary | ICD-10-CM | POA: Diagnosis not present

## 2023-02-15 DIAGNOSIS — I5033 Acute on chronic diastolic (congestive) heart failure: Secondary | ICD-10-CM | POA: Diagnosis not present

## 2023-02-15 DIAGNOSIS — G4733 Obstructive sleep apnea (adult) (pediatric): Secondary | ICD-10-CM | POA: Diagnosis not present

## 2023-02-15 DIAGNOSIS — Z7401 Bed confinement status: Secondary | ICD-10-CM | POA: Diagnosis not present

## 2023-02-15 DIAGNOSIS — I272 Pulmonary hypertension, unspecified: Secondary | ICD-10-CM | POA: Diagnosis not present

## 2023-02-15 DIAGNOSIS — L89152 Pressure ulcer of sacral region, stage 2: Secondary | ICD-10-CM | POA: Diagnosis not present

## 2023-02-15 DIAGNOSIS — E43 Unspecified severe protein-calorie malnutrition: Secondary | ICD-10-CM | POA: Diagnosis not present

## 2023-02-15 DIAGNOSIS — J9601 Acute respiratory failure with hypoxia: Secondary | ICD-10-CM | POA: Diagnosis not present

## 2023-02-15 DIAGNOSIS — L89313 Pressure ulcer of right buttock, stage 3: Secondary | ICD-10-CM | POA: Diagnosis not present

## 2023-02-15 DIAGNOSIS — I4821 Permanent atrial fibrillation: Secondary | ICD-10-CM | POA: Diagnosis not present

## 2023-02-15 DIAGNOSIS — J42 Unspecified chronic bronchitis: Secondary | ICD-10-CM | POA: Diagnosis not present

## 2023-02-15 DIAGNOSIS — E785 Hyperlipidemia, unspecified: Secondary | ICD-10-CM | POA: Diagnosis not present

## 2023-02-15 DIAGNOSIS — A419 Sepsis, unspecified organism: Secondary | ICD-10-CM | POA: Diagnosis not present

## 2023-02-15 DIAGNOSIS — I2729 Other secondary pulmonary hypertension: Secondary | ICD-10-CM | POA: Diagnosis not present

## 2023-02-15 DIAGNOSIS — N1831 Chronic kidney disease, stage 3a: Secondary | ICD-10-CM | POA: Diagnosis not present

## 2023-02-15 DIAGNOSIS — I5082 Biventricular heart failure: Secondary | ICD-10-CM | POA: Diagnosis not present

## 2023-02-15 DIAGNOSIS — N179 Acute kidney failure, unspecified: Secondary | ICD-10-CM | POA: Diagnosis not present

## 2023-02-15 DIAGNOSIS — J9602 Acute respiratory failure with hypercapnia: Secondary | ICD-10-CM | POA: Diagnosis not present

## 2023-02-15 DIAGNOSIS — Z743 Need for continuous supervision: Secondary | ICD-10-CM | POA: Diagnosis not present

## 2023-02-15 DIAGNOSIS — M109 Gout, unspecified: Secondary | ICD-10-CM | POA: Diagnosis not present

## 2023-02-15 DIAGNOSIS — E8809 Other disorders of plasma-protein metabolism, not elsewhere classified: Secondary | ICD-10-CM | POA: Diagnosis not present

## 2023-02-15 DIAGNOSIS — J449 Chronic obstructive pulmonary disease, unspecified: Secondary | ICD-10-CM | POA: Diagnosis not present

## 2023-02-15 DIAGNOSIS — R531 Weakness: Secondary | ICD-10-CM | POA: Diagnosis not present

## 2023-02-15 DIAGNOSIS — E662 Morbid (severe) obesity with alveolar hypoventilation: Secondary | ICD-10-CM | POA: Diagnosis not present

## 2023-02-15 DIAGNOSIS — I5032 Chronic diastolic (congestive) heart failure: Secondary | ICD-10-CM | POA: Diagnosis not present

## 2023-02-15 DIAGNOSIS — I509 Heart failure, unspecified: Secondary | ICD-10-CM | POA: Diagnosis not present

## 2023-02-15 DIAGNOSIS — Z515 Encounter for palliative care: Secondary | ICD-10-CM | POA: Diagnosis not present

## 2023-02-15 DIAGNOSIS — E1165 Type 2 diabetes mellitus with hyperglycemia: Secondary | ICD-10-CM | POA: Diagnosis not present

## 2023-02-15 DIAGNOSIS — I4891 Unspecified atrial fibrillation: Secondary | ICD-10-CM | POA: Diagnosis not present

## 2023-02-15 DIAGNOSIS — R5381 Other malaise: Secondary | ICD-10-CM | POA: Diagnosis not present

## 2023-02-15 DIAGNOSIS — Z7901 Long term (current) use of anticoagulants: Secondary | ICD-10-CM | POA: Diagnosis not present

## 2023-02-15 DIAGNOSIS — G9341 Metabolic encephalopathy: Secondary | ICD-10-CM | POA: Diagnosis not present

## 2023-02-15 DIAGNOSIS — I482 Chronic atrial fibrillation, unspecified: Secondary | ICD-10-CM | POA: Diagnosis not present

## 2023-02-15 DIAGNOSIS — N390 Urinary tract infection, site not specified: Secondary | ICD-10-CM | POA: Diagnosis not present

## 2023-02-15 LAB — BASIC METABOLIC PANEL
Anion gap: 8 (ref 5–15)
BUN: 48 mg/dL — ABNORMAL HIGH (ref 8–23)
CO2: 29 mmol/L (ref 22–32)
Calcium: 9.4 mg/dL (ref 8.9–10.3)
Chloride: 100 mmol/L (ref 98–111)
Creatinine, Ser: 1.69 mg/dL — ABNORMAL HIGH (ref 0.61–1.24)
GFR, Estimated: 41 mL/min — ABNORMAL LOW (ref >60)
Glucose, Bld: 120 mg/dL — ABNORMAL HIGH (ref 70–99)
Potassium: 4.3 mmol/L (ref 3.5–5.1)
Sodium: 137 mmol/L (ref 135–145)

## 2023-02-15 LAB — GLUCOSE, CAPILLARY
Glucose-Capillary: 102 mg/dL — ABNORMAL HIGH (ref 70–99)
Glucose-Capillary: 129 mg/dL — ABNORMAL HIGH (ref 70–99)

## 2023-02-15 MED ORDER — TRAMADOL HCL 50 MG PO TABS: 50.0000 mg | ORAL_TABLET | Freq: Four times a day (QID) | ORAL | 0 refills | Status: DC | PRN

## 2023-02-15 NOTE — Progress Notes (Signed)
[facility] ([facility]) Hospital Liaison Note  Notified by TOC manager of patient/family request for [facility] palliative services at home after discharge.   [facility] hospital liaison will follow patient for discharge disposition.   Please call with any hospice or outpatient palliative care related questions.   Thank you for the opportunity to participate in this patient's care.   Shanita Wicker, LCSW [facility] Hospital Liaison [phone]  

## 2023-02-15 NOTE — Consult Note (Signed)
   [facility] CM Inpatient Consult   02/15/2023  [name] [birth year] [id number]  Follow up:  [facility] [[facility]]  Accountable Care Organization [ACO] Patient:  [name]  Patient did not go to a [facility] affiliated facility at this time.  Patient went to [facility].  No follow up planned with [facility] RN for [facility]  Charlesetta Shanks, RN BSN CCM [facility]  [phone] business mobile phone Toll free office [phone]  *Concierge Line  [phone] Fax number: [phone] [email or web address]

## 2023-02-15 NOTE — Plan of Care (Signed)

## 2023-02-15 NOTE — Discharge Summary (Addendum)
Physician Discharge Summary  [name] [name] ZOX:[id number] DOB: [birth year] DOA: 01/30/2023  PCP: Corwin Levins, MD  Admit date: 01/30/2023 Discharge date: 02/15/2023  Time spent: 45 minutes  Recommendations for Outpatient Follow-up:  SNF for short-term rehab Cardiology Dr. Jens Som in 2 weeks, please titrate torsemide dose as needed, discharge weight 225 LB (102 kg) BMP in 1 week Chronic Foley, routine catheter care, recommend urology follow-up in 1 month, foley last changed on 7/3 Continue Nightly BIPAP   Discharge Diagnoses:  Active Problems:   Acute on chronic diastolic CHF (congestive heart failure) (HCC) Severe hypoalbuminemia Permanent A-fib Acute on chronic hypoxic respiratory failure   Acute kidney injury superimposed on chronic kidney disease (HCC)   Essential hypertension   Atrial fibrillation, chronic (HCC)   BPH (benign prostatic hyperplasia)   Gout   Type 2 diabetes mellitus with hyperlipidemia (HCC)   Pressure injury of skin   Class 2 obesity   Sepsis secondary to UTI [facility])   Discharge Condition: Low-sodium, diabetic  Diet recommendation: Improved Filed Weights   02/13/23 0400 02/14/23 0500 02/15/23 0500  Weight: 103 kg 101.6 kg 102.1 kg    History of present illness:  89 yom w/ diastolic CHF, migraine, T2DM,hypertension, dyslipidemia, GERD, OSA and pulmonary hypertension,presented to ED W/ acute onset of worsening dyspnea with associated orthopnea and paroxysmal nocturnal dyspnea and lower extremity edema. -EM[facility] called [facility] because patient had AMS, hypoxia -In the ED he was hypoxic, hypotensive, placed on BiPAP, diuretics, abnormal UA, creatinine 2.0, albumin less than 1.5 -portable chest x-ray showed finding consistent with CHF, CT head no acute finding.  Renal ultrasound increased echogenicity ABG suggestive of respiratory acidosis.  Hospital Course:   Acute on chronic diastolic CHF Severe hypoalbuminemia and third spacing -Echo  with EF 60 to 65%, mild LVH, RV systolic function preserved, LA and RA with moderate dilatation.  -diuresed with IV lasix, 6/26 given albumin IV, volume status has improved he is 15.2 L negative, weight down 16 LB -continue metoprolol, spironolactone,  -Loop diuretics were held temporarily with soft blood pressures, torsemide started 7/3 instead of Lasix given severe hypoalbuminemia -No SGLT2i due to risk of urine infection, urinary retention, foley catheter in place.  -Plan for SNF for short-term rehab, this has been long delayed, now finally approved  Acute on chronic hypercapnic and hypoxemic respiratory failure Acute metabolic encephalopathy, resolved.  -Mental status has improved  -Multifactorial secondary to hypoxia, hypercarbia, gabapentin -continue nightly BIPAP   AKI on CKD 3a Hypokalemia -Creatinine stable, started torsemide yesterday -Continue spironolactone.    Sepsis secondary to UTI (HCC) Culture from 01/12/23 with 20,000 CFU klebsiella pneumonia.  Patient has been afebrile and no leukocytosis.  Completed oral cephalexin total of 3 days.    Essential hypertension Continue metoprolol.    Atrial fibrillation, chronic (HCC) Continue  metoprolol and apixaban.    BPH (benign prostatic hyperplasia) Continue with flomax, he has foley catheter in place, he has failed voiding trials.  Follow up with Urology as outpatient.  -foley changed 7/3   Gout Continue allopurinol. -For back pain continue as needed tramadol. Gabapentin has been discontinued due to somnolence.    Type 2 diabetes mellitus with hyperlipidemia (HCC) -CBG's are stable, glipizide resumed  Discharge Exam: Vitals:   02/15/23 0724 02/15/23 1107  BP: 139/75 99/61  Pulse: 88 80  Resp: 18 13  Temp:  98.2 F (36.8 C)  SpO2: 97% 99%    Chronically ill male laying in bed, AAO x  2, mild cognitive deficits HEENT: No JVD CVS: S1-S2, regular rhythm Lungs: Decreased breath sounds to bases Abdomen: Soft,  nontender, bowel sounds present Extremities: Trace edema Discharge Instructions    Allergies as of 02/15/2023       Reactions   Codeine    Altered mental status "goofy"   Heparin Rash    Abdominal rash 01/2013        Medication List     STOP taking these medications    diltiazem 120 MG 24 hr capsule Commonly known as: Cardizem CD       TAKE these medications    acetaminophen 500 MG tablet Commonly known as: TYLENOL Take 500-1,000 mg by mouth every 6 (six) hours as needed for moderate pain.   allopurinol 100 MG tablet Commonly known as: ZYLOPRIM TAKE 1 TABLET DAILY   atorvastatin 20 MG tablet Commonly known as: LIPITOR TAKE 1 TABLET DAILY What changed: when to take this   cholecalciferol 25 MCG (1000 UNIT) tablet Commonly known as: VITAMIN D3 Take 2,000 Units by mouth daily.   Eliquis 5 MG Tabs tablet Generic drug: apixaban TAKE 1 TABLET TWICE A DAY What changed: how much to take   glipiZIDE 10 MG 24 hr tablet Commonly known as: GLUCOTROL XL Take 1 tablet (10 mg total) by mouth daily with breakfast.   Lancets Misc Use as directed once daily  E11.9   metoprolol tartrate 25 MG tablet Commonly known as: LOPRESSOR TAKE 1 TABLET(25 MG) BY MOUTH TWICE DAILY What changed: See the new instructions.   OXYGEN Inhale 3-5 L into the lungs daily. 3L Daily  4L Resting  5L Exertion   polyethylene glycol 17 g packet Commonly known as: MIRALAX / GLYCOLAX Take 17 g by mouth daily as needed.   saccharomyces boulardii 250 MG capsule Commonly known as: Florastor Take 1 capsule (250 mg total) by mouth 2 (two) times daily for 14 days.   spironolactone 25 MG tablet Commonly known as: ALDACTONE Take 0.5 tablets (12.5 mg total) by mouth daily.   tamsulosin 0.4 MG Caps capsule Commonly known as: FLOMAX Take 1 capsule (0.4 mg total) by mouth daily after supper.   torsemide 20 MG tablet Commonly known as: DEMADEX Take 1 tablet (20 mg total) by mouth daily.    traMADol 50 MG tablet Commonly known as: ULTRAM Take 1 tablet (50 mg total) by mouth every 6 (six) hours as needed for moderate pain.        Allergies  Allergen Reactions   Codeine     Altered mental status "goofy"   Heparin Rash     Abdominal rash 01/2013    Follow-up Information     [facility] Follow up.   Specialty: Cardiology Why: [facility] [place] location - Please come to the office on Wednesday, July 3rd for bloodwork (basic metabolic panel) between 8am and 4pm. The lab does take a lunch around 12-1. You do not need to be fasting. - The office will otherwise call you to arrange follow-up. Contact information: [address] [id number] mc [place] [address] [phone]        [facility]. Go in 11 day(s).   Specialty: Cardiology Why: Hospital follow up 02/22/2023 @ 12 noon PLEASE bring a current medication list to appointment Free valet parking, Entrance C, off [address] information: [address] [id number] mc [place] [address] [phone]  The results of significant diagnostics from this hospitalization (including imaging, microbiology, ancillary and laboratory) are listed below for reference.    Significant Diagnostic Studies: US Renal  Result Date: 01/30/2023 CLINICAL DATA:  aki EXAM: RENAL / URINARY TRACT ULTRASOUND COMPLETE COMPARISON:  CT abdomen pelvis 01/12/2024 FINDINGS: Right Kidney: Renal measurements: 10.4 x 5 x 4.7 cm = volume: 127 mL. Renal cortical scarring. Echogenicity increased. No mass or hydronephrosis visualized. Left Kidney: Renal measurements: 13.5 x 6.2 x 7 cm = volume: 306 mL. Echogenicity increased. Multiple simple renal cysts as well as a single minimally complex thinly septated cyst noted-no further follow-up indicated. No solid mass or hydronephrosis visualized.  Urinary bladder: Appears normal for degree of bladder distention. Other: None. IMPRESSION: Increased renal echogenicity suggestive of renal parenchymal disease. Electronically Signed   By: Tish Frederickson M.D.   On: 01/30/2023 23:32   CT Head Wo Contrast  Result Date: 01/30/2023 CLINICAL DATA:  Mental status change, unknown cause EXAM: CT HEAD WITHOUT CONTRAST TECHNIQUE: Contiguous axial images were obtained from the base of the skull through the vertex without intravenous contrast. RADIATION DOSE REDUCTION: This exam was performed according to the departmental dose-optimization program which includes automated exposure control, adjustment of the mA and/or kV according to patient size and/or use of iterative reconstruction technique. COMPARISON:  CT head 01/12/2023 FINDINGS: Brain: Cerebral ventricle sizes are concordant with the degree of cerebral volume loss. Patchy and confluent areas of decreased attenuation are noted throughout the deep and periventricular white matter of the cerebral hemispheres bilaterally, compatible with chronic microvascular ischemic disease. No evidence of large-territorial acute infarction. No parenchymal hemorrhage. No mass lesion. No extra-axial collection. No mass effect or midline shift. No hydrocephalus. Basilar cisterns are patent. Vascular: No hyperdense vessel. Skull: No acute fracture or focal lesion. Sinuses/Orbits: Paranasal sinuses and mastoid air cells are clear. Bilateral lens replacement. Otherwise the orbits are unremarkable. Other: None. IMPRESSION: No acute intracranial abnormality. Electronically Signed   By: Tish Frederickson M.D.   On: 01/30/2023 22:07   DG Chest Port 1 View  Result Date: 01/30/2023 CLINICAL DATA:  Sepsis EXAM: PORTABLE CHEST 1 VIEW COMPARISON:  01/14/2023 FINDINGS: Single frontal view of the chest demonstrates an enlarged cardiac silhouette. There is central vascular congestion, with patchy perihilar airspace disease and small bilateral  pleural effusions. No pneumothorax. No acute bony abnormalities. IMPRESSION: 1. Constellation of findings most consistent with congestive heart failure. Electronically Signed   By: Sharlet Salina M.D.   On: 01/30/2023 20:31    Microbiology: No results found for this or any previous visit (from the past 240 hour(s)).   Labs: Basic Metabolic Panel: Recent Labs  Lab 02/11/23 0047 02/12/23 0033 02/13/23 0048 02/14/23 0054 02/15/23 0026  NA 141 140 141 140 137  K 4.1 4.1 4.1 4.0 4.3  CL 102 101 103 102 100  CO2 29 31 29 30 29   GLUCOSE 108* 118* 132* 119* 120*  BUN 39* 43* 43* 48* 48*  CREATININE 1.75* 1.78* 1.54* 1.87* 1.69*  CALCIUM 9.5 9.4 9.7 9.6 9.4   Liver Function Tests: No results for input(s): "AST", "ALT", "ALKPHOS", "BILITOT", "PROT", "ALBUMIN" in the last 168 hours.  No results for input(s): "LIPASE", "AMYLASE" in the last 168 hours. No results for input(s): "AMMONIA" in the last 168 hours. CBC: Recent Labs  Lab 02/11/23 0047  WBC 10.0  HGB 10.8*  HCT 35.1*  MCV 91.9  PLT 235   Cardiac Enzymes: No results for input(s): "CKTOTAL", "CKMB", "CKMBINDEX", "TROPONINI" in the last  168 hours. BNP: BNP (last 3 results) Recent Labs    11/22/22 0342 01/30/23 2220  BNP 158.4* 230.3*    ProBNP (last 3 results) No results for input(s): "PROBNP" in the last 8760 hours.  CBG: Recent Labs  Lab 02/14/23 1139 02/14/23 1626 02/14/23 2050 02/15/23 0540 02/15/23 1105  GLUCAP 119* 150* 131* 102* 129*       Signed:  Zannie Cove MD.  [facility] 02/15/2023, 1:42 PM

## 2023-02-15 NOTE — TOC Progression Note (Addendum)
Transition of Care [facility]) - Progression Note    Patient Details  Name: [name] [name] MRN: [id number] Date of Birth: 12-Mar-1946  Transition of Care [facility]) CM/SW Contact  Delilah Shan, LCSWA Phone Number: 02/15/2023, 1:26 PM  Clinical Narrative:     Patients appeal has been overturned. Plan Auth ID# [id number]. Auth ID# [id number]. Insurance authorization has been approved from 7/8-7/12. CSW spoke with S[facility] with [facility] who confirmed they can accept patient today if medically ready. CSW updated patients daughter [name]. [name] confirmed she can bring Bipap from home to facility for patient.CSW received consult to for referral for palliative services to follow patient at SNF. CSW spoke with patients daughter [name] who gave CSW permission to make referral to [facility] for palliative services to follow patient at SNF. CSW made referral to [facility] with [facility] for palliative services to follow patient at SNF.CSW informed MD. CSW will continue to follow.  Expected Discharge Plan: Skilled Nursing Facility Barriers to Discharge: Continued Medical Work up  Expected Discharge Plan and Services In-house Referral: Clinical Social Work Discharge Planning Services:  (CSW) Post Acute Care Choice: Skilled Nursing Facility Living arrangements for the past 2 months: Skilled Nursing Facility                 DME Arranged: N/A DME Agency: NA                   Social Determinants of Health (SDOH) Interventions SDOH Screenings   Food Insecurity: No Food Insecurity (01/31/2023)  Housing: Low Risk  (02/11/2023)  Transportation Needs: No Transportation Needs (02/11/2023)  Utilities: Not At Risk (01/31/2023)  Alcohol Screen: Low Risk  (02/11/2023)  Depression (PHQ2-9): Low Risk  (10/08/2022)  Financial Resource Strain: Low Risk  (02/11/2023)  Tobacco Use: Medium Risk (02/11/2023)    Readmission Risk Interventions    01/14/2023    1:35 PM  Readmission Risk Prevention Plan  Transportation Screening Complete   Medication Review (RN Care Manager) Complete  PCP or Specialist appointment within 3-5 days of discharge Complete  HRI or Home Care Consult Complete  Palliative Care Screening Not Applicable  Skilled Nursing Facility Not Applicable

## 2023-02-15 NOTE — Progress Notes (Signed)
Occupational Therapy Treatment Patient Details Name: [name] [name] MRN: [id number] DOB: 1945/09/24 Today's Date: 02/15/2023   History of present illness Pt is 77 year old presented to [facility] via EMS on 01/30/23 from [facility] with AMS, hypoxia, and hematuria in indewelling urinary catheter. Pt c/o back pain from injury a couple of weeks ago. Afib 80-90, RR 24, ETCO2 47, CBG 163. Sp02 on 8L 95%, normally 95% on 4L however found by EMS on no oxygen on arrival to scene. Hypotensive on arrival. Admitted for treatment of acute on chronic diastolic CHF and AKI on cKD PMH - T12-L1 fx with ORIF 11/23/22, morbid obesity, OSA, OHS, DM2, HTN, HLD, COPD, severe pulmonary hypertension, chronic diastolic and right-sided CHF, chronic 3 to 5 L oxygen requirement, permanent A-fib on Eliquis, migraine, arthritis, gout   OT comments  Patient supine in bed upon arrival into room.  Patient agreeable to participate in OT treatment session thsi date.  Patient completed supine to sitting EOB with mod A, unsupported sitting EOB with feet supported with  min A initially and then progress to min guard for sitting balance.  Patient completed UB grooming task with Supervision s/p setup of ADL task.  Patient sitting EOB approx 10 mins before becoming fatigue and requiring to return supine in bed.  Patient required mod A for supine to sit to lift LEs into bed.  Patient left supine in bed with all needs within reach.   Recommendations for follow up therapy are one component of a multi-disciplinary discharge planning process, led by the attending physician.  Recommendations may be updated based on patient status, additional functional criteria and insurance authorization.    Assistance Recommended at Discharge Frequent or constant Supervision/Assistance  Patient can return home with the following  A lot of help with walking and/or transfers;Two people to help with walking and/or transfers;Direct supervision/assist for medications  management;Help with stairs or ramp for entrance   Equipment Recommendations  None recommended by OT    Recommendations for Other Services      Precautions / Restrictions Precautions Precautions: Fall Precaution Comments: limited by back pain,T12-L1 fx with ORIF 11/23/22, watch O2 SATs, 3-5L O2 required Restrictions Weight Bearing Restrictions: No       Mobility Bed Mobility Overal bed mobility: Needs Assistance Bed Mobility: Supine to Sit, Rolling, Sit to Supine Rolling: Mod assist Sidelying to sit: Mod assist (HOB elevated) Supine to sit: Mod assist Sit to supine: Mod assist   General bed mobility comments: reinforcement of logroll technique for comfort. assistance required for upright trunk support sitting upright and assistance for BLE support to return to bed    Transfers Overall transfer level: Needs assistance                       Balance Overall balance assessment: Needs assistance Sitting-balance support: Feet supported     Postural control: Right lateral lean (patient using forearm to leanm on bed rail while sitting EOB)                                 ADL either performed or assessed with clinical judgement   ADL Overall ADL's : Needs assistance/impaired Eating/Feeding: Set up;Independent;Sitting;Bed level   Grooming: Wash/dry hands;Wash/dry face;Oral care;Set up;Supervision/safety;Sitting           Upper Body Dressing : Maximal assistance (donning clean hospital gown)  Extremity/Trunk Assessment Upper Extremity Assessment Upper Extremity Assessment: Generalized weakness            Vision   Vision Assessment?: No apparent visual deficits   Perception     Praxis      Cognition Arousal/Alertness: Awake/alert Behavior During Therapy: WFL for tasks assessed/performed Overall Cognitive Status: History of cognitive impairments - at baseline                                           Exercises      Shoulder Instructions       General Comments      Pertinent Vitals/ Pain       Pain Assessment Pain Assessment: No/denies pain Pain Score: 6  Pain Location: back Pain Descriptors / Indicators: Discomfort, Grimacing Pain Intervention(s): Monitored during session  Home Living                                          Prior Functioning/Environment              Frequency  Min 2X/week        Progress Toward Goals  OT Goals(current goals can now be found in the care plan section)  Progress towards OT goals: Progressing toward goals  Acute Rehab OT Goals OT Goal Formulation: With patient/family Time For Goal Achievement: 02/18/23 Potential to Achieve Goals: Good ADL Goals Pt Will Perform Grooming: standing;with min guard assist Pt Will Perform Lower Body Dressing: sitting/lateral leans;with adaptive equipment;with supervision;with set-up Pt Will Transfer to Toilet: ambulating;with min guard assist;bedside commode Pt Will Perform Tub/Shower Transfer: ambulating;tub bench;rolling walker;with min guard assist  Plan Discharge plan remains appropriate;Frequency remains appropriate    Co-evaluation                 AM-PAC OT "6 Clicks" Daily Activity     Outcome Measure   Help from another person eating meals?: None Help from another person taking care of personal grooming?: A Little Help from another person toileting, which includes using toliet, bedpan, or urinal?: Total Help from another person bathing (including washing, rinsing, drying)?: A Lot Help from another person to put on and taking off regular upper body clothing?: A Lot Help from another person to put on and taking off regular lower body clothing?: Total 6 Click Score: 13    End of Session    OT Visit Diagnosis: Muscle weakness (generalized) (M62.81);Other abnormalities of gait and mobility (R26.89)   Activity Tolerance Patient limited by  fatigue   Patient Left with call bell/phone within reach;in bed   Nurse Communication Mobility status        Time: 0981-1914 OT Time Calculation (min): 26 min  Charges: OT General Charges $OT Visit: 1 Visit OT Treatments $Self Care/Home Management : 23-37 mins  Governor Specking OT/L Denice Paradise 02/15/2023, 12:47 PM

## 2023-02-15 NOTE — TOC Transition Note (Addendum)
Transition of Care [facility]) - CM/SW Discharge Note   Patient Details  Name: [name] [name] MRN: [id number] Date of Birth: [birth year]  Transition of Care [facility]) CM/SW Contact:  Delilah Shan, LCSWA Phone Number: 02/15/2023, 2:05 PM   Clinical Narrative:     [facility] to: [facility]  Anticipated DC date: 02/15/2023  Family notified: [name]  Transport by: Sharin Mons  ?  Per MD patient ready for DC to [facility] with palliative services to follow . Patients daughter confirmed she will bring patients Bipap over to facility.RN, patient, patient's family, and facility notified of DC. Discharge Summary sent to facility. RN given number for report tele# [phone] RM# [phone]. DC packet on chart. Ambulance transport requested for patient.  CSW signing off.   Final next level of care: Skilled Nursing Facility Barriers to Discharge: No Barriers Identified   Patient Goals and CMS Choice   Choice offered to / list presented to : Adult Children  Discharge Placement                [facility] Patient to be transferred to facility by: [facility] Name of family member notified: [name] Patient and family notified of of transfer: 02/15/23  Discharge Plan and Services Additional resources added to the After Visit Summary for   In-house Referral: Clinical Social Work Discharge Planning Services:  (CSW) Post Acute Care Choice: Skilled Nursing Facility          DME Arranged: N/A DME Agency: NA                  Social Determinants of Health (SDOH) Interventions SDOH Screenings   Food Insecurity: No Food Insecurity (01/31/2023)  Housing: Low Risk  (02/11/2023)  Transportation Needs: No Transportation Needs (02/11/2023)  Utilities: Not At Risk (01/31/2023)  Alcohol Screen: Low Risk  (02/11/2023)  Depression (PHQ2-9): Low Risk  (10/08/2022)  Financial Resource Strain: Low Risk  (02/11/2023)  Tobacco Use: Medium Risk (02/11/2023)     Readmission Risk  Interventions    01/14/2023    1:35 PM  Readmission Risk Prevention Plan  Transportation Screening Complete  Medication Review (RN Care Manager) Complete  PCP or Specialist appointment within 3-5 days of discharge Complete  HRI or Home Care Consult Complete  Palliative Care Screening Not Applicable  Skilled Nursing Facility Not Applicable

## 2023-02-16 DIAGNOSIS — M109 Gout, unspecified: Secondary | ICD-10-CM | POA: Diagnosis not present

## 2023-02-16 DIAGNOSIS — E785 Hyperlipidemia, unspecified: Secondary | ICD-10-CM | POA: Diagnosis not present

## 2023-02-16 DIAGNOSIS — I5032 Chronic diastolic (congestive) heart failure: Secondary | ICD-10-CM | POA: Diagnosis not present

## 2023-02-16 DIAGNOSIS — I10 Essential (primary) hypertension: Secondary | ICD-10-CM | POA: Diagnosis not present

## 2023-02-16 DIAGNOSIS — G9341 Metabolic encephalopathy: Secondary | ICD-10-CM | POA: Diagnosis not present

## 2023-02-16 DIAGNOSIS — E1165 Type 2 diabetes mellitus with hyperglycemia: Secondary | ICD-10-CM | POA: Diagnosis not present

## 2023-02-16 DIAGNOSIS — J9610 Chronic respiratory failure, unspecified whether with hypoxia or hypercapnia: Secondary | ICD-10-CM | POA: Diagnosis not present

## 2023-02-16 DIAGNOSIS — I482 Chronic atrial fibrillation, unspecified: Secondary | ICD-10-CM | POA: Diagnosis not present

## 2023-02-16 DIAGNOSIS — E559 Vitamin D deficiency, unspecified: Secondary | ICD-10-CM | POA: Diagnosis not present

## 2023-02-17 DIAGNOSIS — N179 Acute kidney failure, unspecified: Secondary | ICD-10-CM | POA: Diagnosis not present

## 2023-02-17 DIAGNOSIS — I4891 Unspecified atrial fibrillation: Secondary | ICD-10-CM | POA: Diagnosis not present

## 2023-02-17 DIAGNOSIS — A419 Sepsis, unspecified organism: Secondary | ICD-10-CM | POA: Diagnosis not present

## 2023-02-17 DIAGNOSIS — I5032 Chronic diastolic (congestive) heart failure: Secondary | ICD-10-CM | POA: Diagnosis not present

## 2023-02-21 DIAGNOSIS — I10 Essential (primary) hypertension: Secondary | ICD-10-CM | POA: Diagnosis not present

## 2023-02-22 ENCOUNTER — Encounter (HOSPITAL_COMMUNITY): Payer: Self-pay

## 2023-02-22 ENCOUNTER — Ambulatory Visit (HOSPITAL_COMMUNITY)
Admit: 2023-02-22 | Discharge: 2023-02-22 | Disposition: A | Discharge status: Home / Self Care | Payer: Medicare Other | Attending: Physician Assistant | Admitting: Physician Assistant

## 2023-02-22 VITALS — BP 126/78 | HR 108

## 2023-02-22 DIAGNOSIS — R5381 Other malaise: Secondary | ICD-10-CM | POA: Insufficient documentation

## 2023-02-22 DIAGNOSIS — I5082 Biventricular heart failure: Secondary | ICD-10-CM | POA: Insufficient documentation

## 2023-02-22 DIAGNOSIS — I2729 Other secondary pulmonary hypertension: Secondary | ICD-10-CM | POA: Diagnosis not present

## 2023-02-22 DIAGNOSIS — I272 Pulmonary hypertension, unspecified: Secondary | ICD-10-CM | POA: Diagnosis not present

## 2023-02-22 DIAGNOSIS — N1831 Chronic kidney disease, stage 3a: Secondary | ICD-10-CM | POA: Diagnosis not present

## 2023-02-22 DIAGNOSIS — E43 Unspecified severe protein-calorie malnutrition: Secondary | ICD-10-CM | POA: Diagnosis not present

## 2023-02-22 DIAGNOSIS — E662 Morbid (severe) obesity with alveolar hypoventilation: Secondary | ICD-10-CM | POA: Insufficient documentation

## 2023-02-22 DIAGNOSIS — I4821 Permanent atrial fibrillation: Secondary | ICD-10-CM | POA: Diagnosis not present

## 2023-02-22 DIAGNOSIS — E1122 Type 2 diabetes mellitus with diabetic chronic kidney disease: Secondary | ICD-10-CM | POA: Diagnosis not present

## 2023-02-22 DIAGNOSIS — J9610 Chronic respiratory failure, unspecified whether with hypoxia or hypercapnia: Secondary | ICD-10-CM | POA: Diagnosis not present

## 2023-02-22 DIAGNOSIS — Z79899 Other long term (current) drug therapy: Secondary | ICD-10-CM | POA: Insufficient documentation

## 2023-02-22 DIAGNOSIS — I130 Hypertensive heart and chronic kidney disease with heart failure and stage 1 through stage 4 chronic kidney disease, or unspecified chronic kidney disease: Secondary | ICD-10-CM | POA: Diagnosis not present

## 2023-02-22 DIAGNOSIS — N179 Acute kidney failure, unspecified: Secondary | ICD-10-CM | POA: Insufficient documentation

## 2023-02-22 DIAGNOSIS — I5032 Chronic diastolic (congestive) heart failure: Secondary | ICD-10-CM | POA: Insufficient documentation

## 2023-02-22 DIAGNOSIS — Z7901 Long term (current) use of anticoagulants: Secondary | ICD-10-CM | POA: Insufficient documentation

## 2023-02-22 LAB — BRAIN NATRIURETIC PEPTIDE: B Natriuretic Peptide: 75.5 pg/mL (ref 0.0–100.0)

## 2023-02-22 LAB — BASIC METABOLIC PANEL
Anion gap: 7 (ref 5–15)
BUN: 30 mg/dL — ABNORMAL HIGH (ref 8–23)
CO2: 28 mmol/L (ref 22–32)
Calcium: 10 mg/dL (ref 8.9–10.3)
Chloride: 102 mmol/L (ref 98–111)
Creatinine, Ser: 1.44 mg/dL — ABNORMAL HIGH (ref 0.61–1.24)
GFR, Estimated: 50 mL/min — ABNORMAL LOW (ref >60)
Glucose, Bld: 98 mg/dL (ref 70–99)
Potassium: 4.2 mmol/L (ref 3.5–5.1)
Sodium: 137 mmol/L (ref 135–145)

## 2023-02-22 NOTE — Patient Instructions (Addendum)
EKG done today.  Labs done today. We will contact you only if your labs are abnormal.  You may take 1 additional tablet of Torsemide as needed for swelling or a weight gain of 3 pounds or more in 24 hours for 5 pounds in 1 week.   No other medication changes were made. Please continue all current medications as prescribed.  Please wear your compression hose daily, place them on as soon as you get up in the morning and remove before you go to bed at night.  Thank you for allowing [place] to provide your heart failure care after your recent hospitalization. Please follow-up with General cardiology.    Do the following things EVERYDAY: Weigh yourself in the morning before breakfast. Write it down and keep it in a log. Take your medicines as prescribed Eat low salt foods--Limit salt (sodium) to 2000 mg per day.  Stay as active as you can everyday Limit all fluids for the day to less than 2 liters

## 2023-02-22 NOTE — Progress Notes (Addendum)
HEART & VASCULAR TRANSITION OF CARE CONSULT NOTE     Referring Physician: Dr. Jomarie Longs Primary Care: Dr. Jonny Ruiz Primary Cardiologist: Dr. Jens Som  HPI: Referred to clinic by Dr. Jomarie Longs with [facility] for heart failure consultation. Mr. Thompson is a 77 y.o. male with history of HFpEF with RV failure, pulmonary hypertension, chronic respiratory failure on 3L O2 chronically, DM II, permanent atrial fibrillation, CKD IIIa, chronic foley.   Pulmonary hypertension previously thought to be d/t combination of WHO groups II and III. RHC in 2014: RA 15, RV 44/11, PCWP 21, PA 41/30, CO 4.49, CI 1.8, PA sat 61%   Echo 12/2015: EF 55-60%, RV okay  Echo 12/22: EF 55-60%, RV okay, TR signal inadequate to estimate PA pressure, moderate BAE  He was admitted 04/11-04/29/24 after a mechanical fall resulting in T12-L1 endplate spine fracture s/p ORIF. Developed AKI while on home diuretics and required IV hydration. Echo during admit with EF 60-65%, RV okay.   He was readmitted from 6/5 - 01/19/23 for UTI and sepsis. On presentation, he was in rapid Afib, febrile and hypotensive. Blood and urine cultures grew Klebsiella. He was treated with antibiotics. Course c/b AKI for which he was given IVF. He was discharged off Torsemide.  Unfortunately, he was readmitted 01/30/23 with AMS, acute on chronic hypoxic and hypercapnic respiratory failure requiring BiPAP 2/2 a/c CHF, sepsis secondary to UTI and AKI. Cardiology consulted. He appeared volume overloaded with significant anasarca. It was noted that he had severe malnutrition with albumin < 1.5. Diuresed well with IV lasix after albumin added. GDMT limited by BP and risk of UTIs (no SGLT2i). He was discharged to SNF for rehab.   He was seen by palliative care during most recent admission for GOC discussion. Family wishes for 1 attempt at CPR but no mechanical ventilation. PPS 30%.   He is here today for hospital follow-up. Patient is accompanied by his daughter. He feels  his strength has been slowly improving with rehab. Able to stand with assistance. His paperwork from the facility does not include weights but he reports he is being weighed daily. No dyspnea at rest, orthopnea, PND or LE edema. He is on a low-sodium diet and limits fluid intake. Using BiPAP nightly.    Past Medical History:  Diagnosis Date   Acute right-sided CHF (congestive heart failure) (HCC)    a. 01/2013.   Arthritis    "knees and hands; thoracic area of the spine" (01/05/2013)   BRONCHITIS, CHRONIC 01/02/2009   COMMON MIGRAINE    "none since treating for high BP"   Complication of anesthesia    "aspiration pneumonia after hand OR" (01/05/2013)   DIABETES MELLITUS, TYPE II    GOUT    HYPERLIPIDEMIA    HYPERTENSION    Long term (current) use of anticoagulants    Morbid obesity (HCC)    OBESITY HYPOVENTILATION SYNDROME    a. on home O2.   On home oxygen therapy    OSA (obstructive sleep apnea)    Permanent atrial fibrillation (HCC)    PROSTATE SPECIFIC ANTIGEN, ELEVATED 06/09/2007   Pulmonary HTN (HCC)    a. multifactorial including obstructive sleep apnea, obesity hypoventilation syndrome and possible pulmonary venous hypertension.   SKIN RASH 03/12/2010   Unspecified hearing loss     Current Outpatient Medications  Medication Sig Dispense Refill   allopurinol (ZYLOPRIM) 100 MG tablet TAKE 1 TABLET DAILY (Patient taking differently: Take 100 mg by mouth daily.) 90 tablet 3   ammonium lactate (LAC-HYDRIN)  12 % lotion Apply topically.     atorvastatin (LIPITOR) 20 MG tablet TAKE 1 TABLET DAILY (Patient taking differently: Take 20 mg by mouth at bedtime.) 90 tablet 3   CALMOSEPTINE 0.44-20.6 % OINT Apply topically.     cholecalciferol (VITAMIN D3) 25 MCG (1000 UNIT) tablet Take 2,000 Units by mouth daily.     ELIQUIS 5 MG TABS tablet TAKE 1 TABLET TWICE A DAY (Patient taking differently: Take 5 mg by mouth 2 (two) times daily.) 180 tablet 3   glipiZIDE (GLUCOTROL XL) 10 MG 24 hr  tablet Take 1 tablet (10 mg total) by mouth daily with breakfast. 90 tablet 3   Lactobacillus (ACIDOPHILUS) CAPS capsule Take 1 capsule by mouth 2 (two) times daily.     Lancets MISC Use as directed once daily  E11.9 100 each 12   metoprolol tartrate (LOPRESSOR) 25 MG tablet TAKE 1 TABLET(25 MG) BY MOUTH TWICE DAILY (Patient taking differently: Take 25 mg by mouth 2 (two) times daily.) 180 tablet 3   OXYGEN Inhale 3-5 L into the lungs daily. 3L Daily  4L Resting  5L Exertion     polyethylene glycol (MIRALAX / GLYCOLAX) 17 g packet Take 17 g by mouth daily as needed. 14 each 0   saccharomyces boulardii (FLORASTOR) 250 MG capsule Take 1 capsule (250 mg total) by mouth 2 (two) times daily for 14 days. 28 capsule 0   spironolactone (ALDACTONE) 25 MG tablet Take 0.5 tablets (12.5 mg total) by mouth daily.     tamsulosin (FLOMAX) 0.4 MG CAPS capsule Take 1 capsule (0.4 mg total) by mouth daily after supper. 30 capsule    torsemide (DEMADEX) 20 MG tablet Take 1 tablet (20 mg total) by mouth daily.     traMADol (ULTRAM) 50 MG tablet Take 1 tablet (50 mg total) by mouth every 6 (six) hours as needed for moderate pain. 10 tablet 0   acetaminophen (TYLENOL) 500 MG tablet Take 500-1,000 mg by mouth every 6 (six) hours as needed for moderate pain.     Current Facility-Administered Medications  Medication Dose Route Frequency Provider Last Rate Last Admin   cyanocobalamin ((VITAMIN B-12)) injection 1,000 mcg  1,000 mcg Intramuscular Q30 days Corwin Levins, MD   1,000 mcg at 08/06/21 8295    Allergies  Allergen Reactions   Codeine     Altered mental status "goofy"   Heparin Rash     Abdominal rash 01/2013      Social History   Socioeconomic History   Marital status: Widowed    Spouse name: Not on file   Number of children: 2   Years of education: Not on file   Highest education level: Not on file  Occupational History   Occupation: Pensions consultant    Employer: [employer]  Tobacco  Use   Smoking status: Former    Current packs/day: 0.00    Average packs/day: 1 pack/day for 15.0 years (15.0 ttl pk-yrs)    Types: Cigarettes    Start date: 08/10/1963    Quit date: 08/09/1978    Years since quitting: 44.5   Smokeless tobacco: Never   Tobacco comments:    01/05/2013 "quit smoking ~ 40 years ago"  Vaping Use   Vaping status: Never Used  Substance and Sexual Activity   Alcohol use: Yes    Alcohol/week: 0.0 standard drinks of alcohol    Comment: 01/05/2013 "hasn't had a drink since 1980's; never had problem w/it"   Drug use: No  Sexual activity: Not Currently  Other Topics Concern   Not on file  Social History Narrative   Not on file   Social Determinants of Health   Financial Resource Strain: Low Risk  (02/11/2023)   Overall Financial Resource Strain (CARDIA)    Difficulty of Paying Living Expenses: Not very hard  Food Insecurity: No Food Insecurity (01/31/2023)   Hunger Vital Sign    Worried About Running Out of Food in the Last Year: Never true    Ran Out of Food in the Last Year: Never true  Transportation Needs: No Transportation Needs (02/11/2023)   PRAPARE - Administrator, Civil Service (Medical): No    Lack of Transportation (Non-Medical): No  Physical Activity: Not on file  Stress: Not on file  Social Connections: Not on file  Intimate Partner Violence: Not At Risk (01/31/2023)   Humiliation, Afraid, Rape, and Kick questionnaire    Fear of Current or Ex-Partner: No    Emotionally Abused: No    Physically Abused: No    Sexually Abused: No      Family History  Problem Relation Age of Onset   Alzheimer's disease Father    Prostate cancer Father    Cancer Other        Breast Cancer, <50 yo 1st degree relative   Coronary artery disease Other        Male, 1st degree relative    Vitals:   02/22/23 1222  BP: 126/78  Pulse: (!) 108  SpO2: 98%    PHYSICAL EXAM: General:  Chronically ill appearing elderly male HEENT: normal Neck:  supple. no JVD. Carotids 2+ bilat; no bruits.  Cor: PMI nondisplaced. Irregular rhythm. No rubs, gallops or murmurs. Lungs: diminished, on 3L continuous O2 Abdomen: soft, nontender, nondistended.  Extremities: no cyanosis, clubbing, rash, 1+ edema, chronic venous stasis changes Neuro: alert & oriented x 3. Affect pleasant.  ECG: Atrial fibrillation with rate of 102 bpm   ASSESSMENT & PLAN: HFpEF with RV failure -Suspected d/t OHS and OSA -Echo 04/24: EF 60-65%, RV okay, moderate BAE -Recent admission with volume overload after diuretics had been discontinued. Prior to this, he had not been hospitalized for CHF since 2017.  NYHA III, confounded by significant deconditioning/debility.  -Volume looks good. Continue Torsemide 20 mg daily. Provided parameters for SNF to give an extra 20 mg Torsemide PRN for weight gain and worsening edema. -Recommended compression stockings -Continue spiro 12.5 mg daily -Continue metoprolol tartrate 25 BID for atrial fibrillation (not indicated for HFpEF) -No SGLT2i with chronic foley and frequent UTIs -Can consider Entresto or ARB if renal function remains stable -Labs today  Pulmonary hypertension -Felt to be secondary to OSA/OHS and diastolic heart failure -RHC in 2014: RA 15, RV 44/11, PCWP 21, PA 41/30, CO 4.49, CI 1.8, PA sat 61%  -Unable to assess PA pressure on most recent echo -Uses BiPAP nightly with supplemental O2  Permanent atrial fibrillation -Continue Eliquis 5 mg BID.  -Rate low 100s today, but consistently 70s - 80s at SNF. Continue metoprolol tartrate 25 mg BID  CKD IIIa Recent AKI -Scr baseline 1.1-1.3 through April of this year, last couple of months fluctuating between 1.5-2 with recent AKIs -Labs today  DM II -Last A1c 7.0 -On oral agents -No SGLT2i as above  GOC: Remains full code. Seen by Palliative during most recent admission. He's had significant functional decline over the last few months with multiple admissions. He  is hoping to recover enough strength in rehab  to return home.   Referred to HFSW (PCP, Medications, Transportation, ETOH Abuse, Drug Abuse, Insurance, Financial ): No Refer to Pharmacy: No Refer to Home Health: No Refer to Advanced Heart Failure Clinic: No Refer to General Cardiology: No, already established  Follow up  PRN, keep follow-up with Dr. Jens Som in 08/24

## 2023-02-23 DIAGNOSIS — N1831 Chronic kidney disease, stage 3a: Secondary | ICD-10-CM | POA: Diagnosis not present

## 2023-02-23 DIAGNOSIS — Z515 Encounter for palliative care: Secondary | ICD-10-CM | POA: Diagnosis not present

## 2023-02-23 DIAGNOSIS — I5033 Acute on chronic diastolic (congestive) heart failure: Secondary | ICD-10-CM | POA: Diagnosis not present

## 2023-02-23 DIAGNOSIS — E43 Unspecified severe protein-calorie malnutrition: Secondary | ICD-10-CM | POA: Diagnosis not present

## 2023-02-28 DIAGNOSIS — E119 Type 2 diabetes mellitus without complications: Secondary | ICD-10-CM | POA: Diagnosis not present

## 2023-02-28 DIAGNOSIS — L89313 Pressure ulcer of right buttock, stage 3: Secondary | ICD-10-CM | POA: Diagnosis not present

## 2023-02-28 DIAGNOSIS — I509 Heart failure, unspecified: Secondary | ICD-10-CM | POA: Diagnosis not present

## 2023-03-02 DIAGNOSIS — I509 Heart failure, unspecified: Secondary | ICD-10-CM | POA: Diagnosis not present

## 2023-03-04 ENCOUNTER — Telehealth: Payer: Self-pay | Admitting: Internal Medicine

## 2023-03-04 DIAGNOSIS — J449 Chronic obstructive pulmonary disease, unspecified: Secondary | ICD-10-CM | POA: Diagnosis not present

## 2023-03-04 DIAGNOSIS — I5033 Acute on chronic diastolic (congestive) heart failure: Secondary | ICD-10-CM | POA: Diagnosis not present

## 2023-03-04 DIAGNOSIS — J42 Unspecified chronic bronchitis: Secondary | ICD-10-CM | POA: Diagnosis not present

## 2023-03-04 DIAGNOSIS — I130 Hypertensive heart and chronic kidney disease with heart failure and stage 1 through stage 4 chronic kidney disease, or unspecified chronic kidney disease: Secondary | ICD-10-CM | POA: Diagnosis not present

## 2023-03-04 DIAGNOSIS — E1122 Type 2 diabetes mellitus with diabetic chronic kidney disease: Secondary | ICD-10-CM | POA: Diagnosis not present

## 2023-03-04 DIAGNOSIS — N1831 Chronic kidney disease, stage 3a: Secondary | ICD-10-CM | POA: Diagnosis not present

## 2023-03-04 DIAGNOSIS — G4733 Obstructive sleep apnea (adult) (pediatric): Secondary | ICD-10-CM | POA: Diagnosis not present

## 2023-03-04 DIAGNOSIS — L89152 Pressure ulcer of sacral region, stage 2: Secondary | ICD-10-CM | POA: Diagnosis not present

## 2023-03-04 NOTE — Telephone Encounter (Signed)
Ok for verbals 

## 2023-03-04 NOTE — Telephone Encounter (Signed)
Monique with [facility] called requesting verbals for patient to begin home health physical therapy for 1 week 1, 2 week 3 and 1 week 3. Best callback number is [phone].

## 2023-03-06 DIAGNOSIS — J9611 Chronic respiratory failure with hypoxia: Secondary | ICD-10-CM | POA: Diagnosis not present

## 2023-03-06 DIAGNOSIS — S32019D Unspecified fracture of first lumbar vertebra, subsequent encounter for fracture with routine healing: Secondary | ICD-10-CM | POA: Diagnosis not present

## 2023-03-06 DIAGNOSIS — R1312 Dysphagia, oropharyngeal phase: Secondary | ICD-10-CM | POA: Diagnosis not present

## 2023-03-06 DIAGNOSIS — M6281 Muscle weakness (generalized): Secondary | ICD-10-CM | POA: Diagnosis not present

## 2023-03-07 DIAGNOSIS — I5033 Acute on chronic diastolic (congestive) heart failure: Secondary | ICD-10-CM | POA: Diagnosis not present

## 2023-03-07 DIAGNOSIS — N1831 Chronic kidney disease, stage 3a: Secondary | ICD-10-CM | POA: Diagnosis not present

## 2023-03-07 DIAGNOSIS — L89152 Pressure ulcer of sacral region, stage 2: Secondary | ICD-10-CM | POA: Diagnosis not present

## 2023-03-07 DIAGNOSIS — J449 Chronic obstructive pulmonary disease, unspecified: Secondary | ICD-10-CM | POA: Diagnosis not present

## 2023-03-07 DIAGNOSIS — I130 Hypertensive heart and chronic kidney disease with heart failure and stage 1 through stage 4 chronic kidney disease, or unspecified chronic kidney disease: Secondary | ICD-10-CM | POA: Diagnosis not present

## 2023-03-07 DIAGNOSIS — E1122 Type 2 diabetes mellitus with diabetic chronic kidney disease: Secondary | ICD-10-CM | POA: Diagnosis not present

## 2023-03-07 NOTE — Telephone Encounter (Signed)
Called and left voice mail

## 2023-03-08 DIAGNOSIS — I5033 Acute on chronic diastolic (congestive) heart failure: Secondary | ICD-10-CM | POA: Diagnosis not present

## 2023-03-08 DIAGNOSIS — E1122 Type 2 diabetes mellitus with diabetic chronic kidney disease: Secondary | ICD-10-CM | POA: Diagnosis not present

## 2023-03-08 DIAGNOSIS — I130 Hypertensive heart and chronic kidney disease with heart failure and stage 1 through stage 4 chronic kidney disease, or unspecified chronic kidney disease: Secondary | ICD-10-CM | POA: Diagnosis not present

## 2023-03-08 DIAGNOSIS — L89152 Pressure ulcer of sacral region, stage 2: Secondary | ICD-10-CM | POA: Diagnosis not present

## 2023-03-08 DIAGNOSIS — J449 Chronic obstructive pulmonary disease, unspecified: Secondary | ICD-10-CM | POA: Diagnosis not present

## 2023-03-08 DIAGNOSIS — N1831 Chronic kidney disease, stage 3a: Secondary | ICD-10-CM | POA: Diagnosis not present

## 2023-03-09 ENCOUNTER — Telehealth: Payer: Self-pay | Admitting: Podiatry

## 2023-03-09 DIAGNOSIS — L89152 Pressure ulcer of sacral region, stage 2: Secondary | ICD-10-CM | POA: Diagnosis not present

## 2023-03-09 DIAGNOSIS — I5033 Acute on chronic diastolic (congestive) heart failure: Secondary | ICD-10-CM | POA: Diagnosis not present

## 2023-03-09 DIAGNOSIS — I130 Hypertensive heart and chronic kidney disease with heart failure and stage 1 through stage 4 chronic kidney disease, or unspecified chronic kidney disease: Secondary | ICD-10-CM | POA: Diagnosis not present

## 2023-03-09 DIAGNOSIS — E1122 Type 2 diabetes mellitus with diabetic chronic kidney disease: Secondary | ICD-10-CM | POA: Diagnosis not present

## 2023-03-09 DIAGNOSIS — N1831 Chronic kidney disease, stage 3a: Secondary | ICD-10-CM | POA: Diagnosis not present

## 2023-03-09 DIAGNOSIS — J449 Chronic obstructive pulmonary disease, unspecified: Secondary | ICD-10-CM | POA: Diagnosis not present

## 2023-03-09 NOTE — Telephone Encounter (Signed)
Lmom for pt to call back to schedule picking up diabetic shoes / mailed letter to patient if no response by  8/9 shoes will go back to vendor.

## 2023-03-10 DIAGNOSIS — J449 Chronic obstructive pulmonary disease, unspecified: Secondary | ICD-10-CM | POA: Diagnosis not present

## 2023-03-10 DIAGNOSIS — L89152 Pressure ulcer of sacral region, stage 2: Secondary | ICD-10-CM | POA: Diagnosis not present

## 2023-03-10 DIAGNOSIS — N1831 Chronic kidney disease, stage 3a: Secondary | ICD-10-CM | POA: Diagnosis not present

## 2023-03-10 DIAGNOSIS — I130 Hypertensive heart and chronic kidney disease with heart failure and stage 1 through stage 4 chronic kidney disease, or unspecified chronic kidney disease: Secondary | ICD-10-CM | POA: Diagnosis not present

## 2023-03-10 DIAGNOSIS — I5033 Acute on chronic diastolic (congestive) heart failure: Secondary | ICD-10-CM | POA: Diagnosis not present

## 2023-03-10 DIAGNOSIS — E1122 Type 2 diabetes mellitus with diabetic chronic kidney disease: Secondary | ICD-10-CM | POA: Diagnosis not present

## 2023-03-11 ENCOUNTER — Telehealth: Payer: Self-pay | Admitting: Internal Medicine

## 2023-03-11 DIAGNOSIS — J449 Chronic obstructive pulmonary disease, unspecified: Secondary | ICD-10-CM | POA: Diagnosis not present

## 2023-03-11 DIAGNOSIS — L89152 Pressure ulcer of sacral region, stage 2: Secondary | ICD-10-CM | POA: Diagnosis not present

## 2023-03-11 DIAGNOSIS — I130 Hypertensive heart and chronic kidney disease with heart failure and stage 1 through stage 4 chronic kidney disease, or unspecified chronic kidney disease: Secondary | ICD-10-CM | POA: Diagnosis not present

## 2023-03-11 DIAGNOSIS — N1831 Chronic kidney disease, stage 3a: Secondary | ICD-10-CM | POA: Diagnosis not present

## 2023-03-11 DIAGNOSIS — I5033 Acute on chronic diastolic (congestive) heart failure: Secondary | ICD-10-CM | POA: Diagnosis not present

## 2023-03-11 DIAGNOSIS — E1122 Type 2 diabetes mellitus with diabetic chronic kidney disease: Secondary | ICD-10-CM | POA: Diagnosis not present

## 2023-03-11 NOTE — Telephone Encounter (Signed)
Called Sharyn Lull no answer [facility] w/ MD response.Marland KitchenJohny Chess

## 2023-03-11 NOTE — Telephone Encounter (Signed)
Ok for verbals 

## 2023-03-11 NOTE — Telephone Encounter (Signed)
HH ORDERS   Caller Name: [facility] Agency Name: Deatra James Phone #: [phone](secure)  Service Requested: OT (examples: OT/PT/Skilled Nursing/Social Work/Speech Therapy/Wound Care)  Frequency of Visits: 1X a week for 5 weeks   Service: Home health aid Frequency: 1X a week for 2 weeks, 2X a week for 2 weeks

## 2023-03-15 ENCOUNTER — Encounter: Payer: Self-pay | Admitting: Internal Medicine

## 2023-03-15 DIAGNOSIS — I130 Hypertensive heart and chronic kidney disease with heart failure and stage 1 through stage 4 chronic kidney disease, or unspecified chronic kidney disease: Secondary | ICD-10-CM | POA: Diagnosis not present

## 2023-03-15 DIAGNOSIS — L89152 Pressure ulcer of sacral region, stage 2: Secondary | ICD-10-CM | POA: Diagnosis not present

## 2023-03-15 DIAGNOSIS — J449 Chronic obstructive pulmonary disease, unspecified: Secondary | ICD-10-CM | POA: Diagnosis not present

## 2023-03-15 DIAGNOSIS — N1831 Chronic kidney disease, stage 3a: Secondary | ICD-10-CM | POA: Diagnosis not present

## 2023-03-15 DIAGNOSIS — E1122 Type 2 diabetes mellitus with diabetic chronic kidney disease: Secondary | ICD-10-CM | POA: Diagnosis not present

## 2023-03-15 DIAGNOSIS — I5033 Acute on chronic diastolic (congestive) heart failure: Secondary | ICD-10-CM | POA: Diagnosis not present

## 2023-03-16 MED ORDER — TAMSULOSIN HCL 0.4 MG PO CAPS: 0.4000 mg | ORAL_CAPSULE | Freq: Every day | ORAL | 0 refills | Status: DC

## 2023-03-17 ENCOUNTER — Ambulatory Visit: Payer: Medicare Other | Admitting: Internal Medicine

## 2023-03-17 DIAGNOSIS — I130 Hypertensive heart and chronic kidney disease with heart failure and stage 1 through stage 4 chronic kidney disease, or unspecified chronic kidney disease: Secondary | ICD-10-CM | POA: Diagnosis not present

## 2023-03-17 DIAGNOSIS — E1122 Type 2 diabetes mellitus with diabetic chronic kidney disease: Secondary | ICD-10-CM | POA: Diagnosis not present

## 2023-03-17 DIAGNOSIS — L89152 Pressure ulcer of sacral region, stage 2: Secondary | ICD-10-CM | POA: Diagnosis not present

## 2023-03-17 DIAGNOSIS — J449 Chronic obstructive pulmonary disease, unspecified: Secondary | ICD-10-CM | POA: Diagnosis not present

## 2023-03-17 DIAGNOSIS — N1831 Chronic kidney disease, stage 3a: Secondary | ICD-10-CM | POA: Diagnosis not present

## 2023-03-17 DIAGNOSIS — I5033 Acute on chronic diastolic (congestive) heart failure: Secondary | ICD-10-CM | POA: Diagnosis not present

## 2023-03-17 NOTE — Progress Notes (Deleted)
HPI: FU permanent AFib and right side CHF. He has failed DCCV in the past. Myoview 7/09: EF 54%, normal perfusion. Holter 09/2010: AFib with mildly decreased rate. Right heart failure felt secondary to pulmonary hypertension which is multifactorial including OSA, OHS and possible pulmonary venous hypertension. RHC 01/08/13 demonstrated elevated R heart pressures (RA 15, RV 44/11, PCWP 21, PA 41/30, CO 4.49, CI 1.8, PA sat 61%). Patient has had some degree of renal insufficiency and hyperkalemia on a combination of ACE inhibitor and spironolactone. Last echo 12/22 showed normal LV function, moderate biatrial enlargement.  Echocardiogram April 2024 showed normal LV function, mild left ventricular hypertrophy, moderate biatrial enlargement.  Recently discharged following admission for acute on chronic diastolic congestive heart failure.  He was treated with IV Lasix with improvement.  Since last seen  Current Outpatient Medications  Medication Sig Dispense Refill   acetaminophen (TYLENOL) 500 MG tablet Take 500-1,000 mg by mouth every 6 (six) hours as needed for moderate pain.     allopurinol (ZYLOPRIM) 100 MG tablet TAKE 1 TABLET DAILY (Patient taking differently: Take 100 mg by mouth daily.) 90 tablet 3   ammonium lactate (LAC-HYDRIN) 12 % lotion Apply topically.     atorvastatin (LIPITOR) 20 MG tablet TAKE 1 TABLET DAILY (Patient taking differently: Take 20 mg by mouth at bedtime.) 90 tablet 3   CALMOSEPTINE 0.44-20.6 % OINT Apply topically.     cholecalciferol (VITAMIN D3) 25 MCG (1000 UNIT) tablet Take 2,000 Units by mouth daily.     ELIQUIS 5 MG TABS tablet TAKE 1 TABLET TWICE A DAY (Patient taking differently: Take 5 mg by mouth 2 (two) times daily.) 180 tablet 3   glipiZIDE (GLUCOTROL XL) 10 MG 24 hr tablet Take 1 tablet (10 mg total) by mouth daily with breakfast. 90 tablet 3   Lactobacillus (ACIDOPHILUS) CAPS capsule Take 1 capsule by mouth 2 (two) times daily.     Lancets MISC Use as  directed once daily  E11.9 100 each 12   metoprolol tartrate (LOPRESSOR) 25 MG tablet TAKE 1 TABLET(25 MG) BY MOUTH TWICE DAILY (Patient taking differently: Take 25 mg by mouth 2 (two) times daily.) 180 tablet 3   OXYGEN Inhale 3-5 L into the lungs daily. 3L Daily  4L Resting  5L Exertion     polyethylene glycol (MIRALAX / GLYCOLAX) 17 g packet Take 17 g by mouth daily as needed. 14 each 0   spironolactone (ALDACTONE) 25 MG tablet Take 0.5 tablets (12.5 mg total) by mouth daily.     tamsulosin (FLOMAX) 0.4 MG CAPS capsule Take 1 capsule (0.4 mg total) by mouth daily after supper. Keep appt for tomorrow for refills 30 capsule 0   torsemide (DEMADEX) 20 MG tablet Take 1 tablet (20 mg total) by mouth daily.     traMADol (ULTRAM) 50 MG tablet Take 1 tablet (50 mg total) by mouth every 6 (six) hours as needed for moderate pain. 10 tablet 0   Current Facility-Administered Medications  Medication Dose Route Frequency Provider Last Rate Last Admin   cyanocobalamin ((VITAMIN B-12)) injection 1,000 mcg  1,000 mcg Intramuscular Q30 days Corwin Levins, MD   1,000 mcg at 08/06/21 2025     Past Medical History:  Diagnosis Date   Acute right-sided CHF (congestive heart failure) (HCC)    a. 01/2013.   Arthritis    "knees and hands; thoracic area of the spine" (01/05/2013)   BRONCHITIS, CHRONIC 01/02/2009   COMMON MIGRAINE    "none since  treating for high BP"   Complication of anesthesia    "aspiration pneumonia after hand OR" (01/05/2013)   DIABETES MELLITUS, TYPE II    GOUT    HYPERLIPIDEMIA    HYPERTENSION    Long term (current) use of anticoagulants    Morbid obesity (HCC)    OBESITY HYPOVENTILATION SYNDROME    a. on home O2.   On home oxygen therapy    OSA (obstructive sleep apnea)    Permanent atrial fibrillation (HCC)    PROSTATE SPECIFIC ANTIGEN, ELEVATED 06/09/2007   Pulmonary HTN (HCC)    a. multifactorial including obstructive sleep apnea, obesity hypoventilation syndrome and possible  pulmonary venous hypertension.   SKIN RASH 03/12/2010   Unspecified hearing loss     Past Surgical History:  Procedure Laterality Date   APPENDECTOMY  1974   APPLICATION OF ROBOTIC ASSISTANCE FOR SPINAL PROCEDURE N/A 11/23/2022   Procedure: APPLICATION OF ROBOTIC ASSISTANCE FOR SPINAL PROCEDURE;  Surgeon: Lisbeth Renshaw, MD;  Location: [facility] OR;  Service: Neurosurgery;  Laterality: N/A;   CARDIOVERSION  05/21/2008; ~ 06/2008   HERNIA REPAIR     INGUINAL HERNIA REPAIR Right    RIGHT HEART CATHETERIZATION N/A 01/08/2013   Procedure: RIGHT HEART CATH;  Surgeon: Vesta Mixer, MD;  Location[facility] CATH LAB;  Service: Cardiovascular;  Laterality: N/A;   TENDON REPAIR Right 2009   "lacerated his tendon and pulley" Dr. Izora Ribas   UMBILICAL HERNIA REPAIR      Social History   Socioeconomic History   Marital status: Widowed    Spouse name: Not on file   Number of children: 2   Years of education: Not on file   Highest education level: Not on file  Occupational History   Occupation: Pensions consultant    Employer: [employer]  Tobacco Use   Smoking status: Former    Current packs/day: 0.00    Average packs/day: 1 pack/day for 15.0 years (15.0 ttl pk-yrs)    Types: Cigarettes    Start date: 08/10/1963    Quit date: 08/09/1978    Years since quitting: 44.6   Smokeless tobacco: Never   Tobacco comments:    01/05/2013 "quit smoking ~ 40 years ago"  Vaping Use   Vaping status: Never Used  Substance and Sexual Activity   Alcohol use: Yes    Alcohol/week: 0.0 standard drinks of alcohol    Comment: 01/05/2013 "hasn't had a drink since 1980's; never had problem w/it"   Drug use: No   Sexual activity: Not Currently  Other Topics Concern   Not on file  Social History Narrative   Not on file   Social Determinants of Health   Financial Resource Strain: Low Risk  (02/11/2023)   Overall Financial Resource Strain (CARDIA)    Difficulty of Paying Living Expenses: Not very hard  Food  Insecurity: No Food Insecurity (01/31/2023)   Hunger Vital Sign    Worried About Running Out of Food in the Last Year: Never true    Ran Out of Food in the Last Year: Never true  Transportation Needs: No Transportation Needs (02/11/2023)   PRAPARE - Administrator, Civil Service (Medical): No    Lack of Transportation (Non-Medical): No  Physical Activity: Not on file  Stress: Not on file  Social Connections: Not on file  Intimate Partner Violence: Not At Risk (01/31/2023)   Humiliation, Afraid, Rape, and Kick questionnaire    Fear of Current or Ex-Partner: No    Emotionally Abused: No  Physically Abused: No    Sexually Abused: No    Family History  Problem Relation Age of Onset   Alzheimer's disease Father    Prostate cancer Father    Cancer Other        Breast Cancer, <50 yo 1st degree relative   Coronary artery disease Other        Male, 1st degree relative    ROS: no fevers or chills, productive cough, hemoptysis, dysphasia, odynophagia, melena, hematochezia, dysuria, hematuria, rash, seizure activity, orthopnea, PND, pedal edema, claudication. Remaining systems are negative.  Physical Exam: Well-developed well-nourished in no acute distress.  Skin is warm and dry.  HEENT is normal.  Neck is supple.  Chest is clear to auscultation with normal expansion.  Cardiovascular exam is regular rate and rhythm.  Abdominal exam nontender or distended. No masses palpated. Extremities show no edema. neuro grossly intact  ECG- personally reviewed  A/P  1 permanent atrial fibrillation-continue Cardizem and metoprolol for rate control.  Continue apixaban.  2 chronic diastolic congestive heart failure/right heart failure-predominant issue in the past has been right-sided heart failure secondary to obesity hypoventilation syndrome, obstructive sleep apnea and pulmonary venous hypertension.  He appears to be euvolemic.  Continue Demadex and spironolactone at present dose.   Given his obesity and history of indwelling Foley catheter we have avoided SGLT2 inhibitor.  3 hypertension-patient's blood pressure is controlled.  Continue present medical regimen.  4 hyperlipidemia-continue statin.  5 morbid obesity-we discussed the importance of weight loss.  Olga Millers, MD

## 2023-03-18 DIAGNOSIS — J449 Chronic obstructive pulmonary disease, unspecified: Secondary | ICD-10-CM | POA: Diagnosis not present

## 2023-03-18 DIAGNOSIS — I130 Hypertensive heart and chronic kidney disease with heart failure and stage 1 through stage 4 chronic kidney disease, or unspecified chronic kidney disease: Secondary | ICD-10-CM | POA: Diagnosis not present

## 2023-03-18 DIAGNOSIS — I5033 Acute on chronic diastolic (congestive) heart failure: Secondary | ICD-10-CM | POA: Diagnosis not present

## 2023-03-18 DIAGNOSIS — N1831 Chronic kidney disease, stage 3a: Secondary | ICD-10-CM | POA: Diagnosis not present

## 2023-03-18 DIAGNOSIS — E1122 Type 2 diabetes mellitus with diabetic chronic kidney disease: Secondary | ICD-10-CM | POA: Diagnosis not present

## 2023-03-18 DIAGNOSIS — L89152 Pressure ulcer of sacral region, stage 2: Secondary | ICD-10-CM | POA: Diagnosis not present

## 2023-03-22 DIAGNOSIS — I5033 Acute on chronic diastolic (congestive) heart failure: Secondary | ICD-10-CM | POA: Diagnosis not present

## 2023-03-22 DIAGNOSIS — N1831 Chronic kidney disease, stage 3a: Secondary | ICD-10-CM | POA: Diagnosis not present

## 2023-03-22 DIAGNOSIS — I130 Hypertensive heart and chronic kidney disease with heart failure and stage 1 through stage 4 chronic kidney disease, or unspecified chronic kidney disease: Secondary | ICD-10-CM | POA: Diagnosis not present

## 2023-03-22 DIAGNOSIS — E1122 Type 2 diabetes mellitus with diabetic chronic kidney disease: Secondary | ICD-10-CM | POA: Diagnosis not present

## 2023-03-22 DIAGNOSIS — R339 Retention of urine, unspecified: Secondary | ICD-10-CM | POA: Diagnosis not present

## 2023-03-22 DIAGNOSIS — L89152 Pressure ulcer of sacral region, stage 2: Secondary | ICD-10-CM | POA: Diagnosis not present

## 2023-03-22 DIAGNOSIS — J449 Chronic obstructive pulmonary disease, unspecified: Secondary | ICD-10-CM | POA: Diagnosis not present

## 2023-03-23 DIAGNOSIS — L89152 Pressure ulcer of sacral region, stage 2: Secondary | ICD-10-CM | POA: Diagnosis not present

## 2023-03-23 DIAGNOSIS — R339 Retention of urine, unspecified: Secondary | ICD-10-CM | POA: Diagnosis not present

## 2023-03-24 ENCOUNTER — Ambulatory Visit: Payer: Medicare Other | Admitting: Internal Medicine

## 2023-03-24 VITALS — BP 126/78 | HR 100 | Temp 98.3°F | Ht 68.0 in | Wt 236.0 lb

## 2023-03-24 DIAGNOSIS — Z7984 Long term (current) use of oral hypoglycemic drugs: Secondary | ICD-10-CM | POA: Diagnosis not present

## 2023-03-24 DIAGNOSIS — Z978 Presence of other specified devices: Secondary | ICD-10-CM

## 2023-03-24 DIAGNOSIS — E538 Deficiency of other specified B group vitamins: Secondary | ICD-10-CM

## 2023-03-24 DIAGNOSIS — I130 Hypertensive heart and chronic kidney disease with heart failure and stage 1 through stage 4 chronic kidney disease, or unspecified chronic kidney disease: Secondary | ICD-10-CM | POA: Diagnosis not present

## 2023-03-24 DIAGNOSIS — E1122 Type 2 diabetes mellitus with diabetic chronic kidney disease: Secondary | ICD-10-CM | POA: Diagnosis not present

## 2023-03-24 DIAGNOSIS — J309 Allergic rhinitis, unspecified: Secondary | ICD-10-CM

## 2023-03-24 DIAGNOSIS — N390 Urinary tract infection, site not specified: Secondary | ICD-10-CM | POA: Diagnosis not present

## 2023-03-24 DIAGNOSIS — I5033 Acute on chronic diastolic (congestive) heart failure: Secondary | ICD-10-CM | POA: Diagnosis not present

## 2023-03-24 DIAGNOSIS — I10 Essential (primary) hypertension: Secondary | ICD-10-CM

## 2023-03-24 DIAGNOSIS — E119 Type 2 diabetes mellitus without complications: Secondary | ICD-10-CM | POA: Diagnosis not present

## 2023-03-24 DIAGNOSIS — R339 Retention of urine, unspecified: Secondary | ICD-10-CM | POA: Diagnosis not present

## 2023-03-24 DIAGNOSIS — N1831 Chronic kidney disease, stage 3a: Secondary | ICD-10-CM | POA: Diagnosis not present

## 2023-03-24 DIAGNOSIS — E559 Vitamin D deficiency, unspecified: Secondary | ICD-10-CM | POA: Diagnosis not present

## 2023-03-24 DIAGNOSIS — J449 Chronic obstructive pulmonary disease, unspecified: Secondary | ICD-10-CM | POA: Diagnosis not present

## 2023-03-24 DIAGNOSIS — I5032 Chronic diastolic (congestive) heart failure: Secondary | ICD-10-CM

## 2023-03-24 DIAGNOSIS — L89152 Pressure ulcer of sacral region, stage 2: Secondary | ICD-10-CM | POA: Diagnosis not present

## 2023-03-24 LAB — CBC WITH DIFFERENTIAL/PLATELET
Basophils Absolute: 0.1 10*3/uL (ref 0.0–0.1)
Basophils Relative: 0.5 % (ref 0.0–3.0)
Eosinophils Absolute: 0.7 10*3/uL (ref 0.0–0.7)
Eosinophils Relative: 5.3 % — ABNORMAL HIGH (ref 0.0–5.0)
HCT: 38.7 % — ABNORMAL LOW (ref 39.0–52.0)
Hemoglobin: 12.1 g/dL — ABNORMAL LOW (ref 13.0–17.0)
Lymphocytes Relative: 29.6 % (ref 12.0–46.0)
Lymphs Abs: 3.9 10*3/uL (ref 0.7–4.0)
MCHC: 31.3 g/dL (ref 30.0–36.0)
MCV: 91.8 fl (ref 78.0–100.0)
Monocytes Absolute: 0.8 10*3/uL (ref 0.1–1.0)
Monocytes Relative: 5.8 % (ref 3.0–12.0)
Neutro Abs: 7.8 10*3/uL — ABNORMAL HIGH (ref 1.4–7.7)
Neutrophils Relative %: 58.8 % (ref 43.0–77.0)
Platelets: 343 10*3/uL (ref 150.0–400.0)
RBC: 4.21 Mil/uL — ABNORMAL LOW (ref 4.22–5.81)
RDW: 17.6 % — ABNORMAL HIGH (ref 11.5–15.5)
WBC: 13.2 10*3/uL — ABNORMAL HIGH (ref 4.0–10.5)

## 2023-03-24 LAB — LIPID PANEL
Cholesterol: 99 mg/dL (ref 0–200)
HDL: 33.7 mg/dL — ABNORMAL LOW (ref >39.00)
LDL Cholesterol: 45 mg/dL (ref 0–99)
NonHDL: 64.96
Total CHOL/HDL Ratio: 3
Triglycerides: 99 mg/dL (ref 0.0–149.0)
VLDL: 19.8 mg/dL (ref 0.0–40.0)

## 2023-03-24 LAB — BASIC METABOLIC PANEL
BUN: 24 mg/dL — ABNORMAL HIGH (ref 6–23)
CO2: 29 mEq/L (ref 19–32)
Calcium: 10.6 mg/dL — ABNORMAL HIGH (ref 8.4–10.5)
Chloride: 102 mEq/L (ref 96–112)
Creatinine, Ser: 1.56 mg/dL — ABNORMAL HIGH (ref 0.40–1.50)
GFR: 42.64 mL/min — ABNORMAL LOW (ref >60.00)
Glucose, Bld: 122 mg/dL — ABNORMAL HIGH (ref 70–99)
Potassium: 4.2 mEq/L (ref 3.5–5.1)
Sodium: 139 mEq/L (ref 135–145)

## 2023-03-24 LAB — HEPATIC FUNCTION PANEL
ALT: 15 U/L (ref 0–53)
AST: 16 U/L (ref 0–37)
Albumin: 3.5 g/dL (ref 3.5–5.2)
Alkaline Phosphatase: 63 U/L (ref 39–117)
Bilirubin, Direct: 0.2 mg/dL (ref 0.0–0.3)
Total Bilirubin: 0.7 mg/dL (ref 0.2–1.2)
Total Protein: 7.5 g/dL (ref 6.0–8.3)

## 2023-03-24 LAB — TSH: TSH: 1.31 u[IU]/mL (ref 0.35–5.50)

## 2023-03-24 LAB — HEMOGLOBIN A1C: Hgb A1c MFr Bld: 5.5 % (ref 4.6–6.5)

## 2023-03-24 LAB — BRAIN NATRIURETIC PEPTIDE: Pro B Natriuretic peptide (BNP): 63 pg/mL (ref 0.0–100.0)

## 2023-03-24 NOTE — Progress Notes (Signed)
Patient ID: [name] [name], male   DOB: 1945-09-13, 77 y.o.   MRN: [id number]        Chief Complaint: follow up recent hospn with acute on chronic dCHF, acute on chronic resp failure, sepsis and UTI with now chronic foley, chronic afib, dm, htn       HPI:  [name] [name] is a 77 y.o. male here overall doing ok with home o2 and daughter support having been hosp 6/23 - 7/9 with above, then SNF rehab, now home for several days.  Has f/u appt with card Dr Jens Som tomorrow.  Last foley change 7/9 and will need urology f/u as well.  Pt denies chest pain, increased sob or doe, wheezing, orthopnea, PND, increased LE swelling, palpitations, dizziness or syncope.   Pt denies polydipsia, polyuria, or new focal neuro s/s.    Pt denies fever, wt loss, night sweats, loss of appetite, or other constitutional symptom  Gained 11 lbs post last hosp d/c.  Also no longer taking metformin for DM.  Does have several wks ongoing nasal allergy symptoms with clearish congestion, itch and sneezing, without fever, pain, ST, cough, swelling or wheezing.  Wt Readings from Last 3 Encounters:  03/24/23 236 lb (107 kg)  02/15/23 225 lb (102.1 kg)  01/19/23 255 lb 15.3 oz (116.1 kg)   BP Readings from Last 3 Encounters:  03/24/23 126/78  02/22/23 126/78  02/15/23 99/61         Past Medical History:  Diagnosis Date   Acute right-sided CHF (congestive heart failure) (HCC)    a. 01/2013.   Arthritis    "knees and hands; thoracic area of the spine" (01/05/2013)   BRONCHITIS, CHRONIC 01/02/2009   COMMON MIGRAINE    "none since treating for high BP"   Complication of anesthesia    "aspiration pneumonia after hand OR" (01/05/2013)   DIABETES MELLITUS, TYPE II    GOUT    HYPERLIPIDEMIA    HYPERTENSION    Long term (current) use of anticoagulants    Morbid obesity (HCC)    OBESITY HYPOVENTILATION SYNDROME    a. on home O2.   On home oxygen therapy    OSA (obstructive sleep apnea)    Permanent atrial fibrillation (HCC)    PROSTATE  SPECIFIC ANTIGEN, ELEVATED 06/09/2007   Pulmonary HTN (HCC)    a. multifactorial including obstructive sleep apnea, obesity hypoventilation syndrome and possible pulmonary venous hypertension.   SKIN RASH 03/12/2010   Unspecified hearing loss    Past Surgical History:  Procedure Laterality Date   APPENDECTOMY  1974   APPLICATION OF ROBOTIC ASSISTANCE FOR SPINAL PROCEDURE N/A 11/23/2022   Procedure: APPLICATION OF ROBOTIC ASSISTANCE FOR SPINAL PROCEDURE;  Surgeon: Lisbeth Renshaw, MD;  Location: [facility] OR;  Service: Neurosurgery;  Laterality: N/A;   CARDIOVERSION  05/21/2008; ~ 06/2008   HERNIA REPAIR     INGUINAL HERNIA REPAIR Right    RIGHT HEART CATHETERIZATION N/A 01/08/2013   Procedure: RIGHT HEART CATH;  Surgeon: Vesta Mixer, MD;  Location: [facility] CATH LAB;  Service: Cardiovascular;  Laterality: N/A;   TENDON REPAIR Right 2009   "lacerated his tendon and pulley" Dr. Izora Ribas   UMBILICAL HERNIA REPAIR      reports that he quit smoking about 44 years ago. His smoking use included cigarettes. He started smoking about 59 years ago. He has a 15 pack-year smoking history. He has never used smokeless tobacco. He reports current alcohol use. He reports that he does not use drugs. family history  includes Alzheimer's disease in his father; Cancer in an other family member; Coronary artery disease in an other family member; Prostate cancer in his father. Allergies  Allergen Reactions   Codeine     Altered mental status "goofy"   Heparin Rash     Abdominal rash 01/2013   Current Outpatient Medications on File Prior to Visit  Medication Sig Dispense Refill   acetaminophen (TYLENOL) 500 MG tablet Take 500-1,000 mg by mouth every 6 (six) hours as needed for moderate pain.     allopurinol (ZYLOPRIM) 100 MG tablet TAKE 1 TABLET DAILY (Patient taking differently: Take 100 mg by mouth daily.) 90 tablet 3   atorvastatin (LIPITOR) 20 MG tablet TAKE 1 TABLET DAILY (Patient taking differently: Take 20 mg by  mouth at bedtime.) 90 tablet 3   cholecalciferol (VITAMIN D3) 25 MCG (1000 UNIT) tablet Take 2,000 Units by mouth daily.     ELIQUIS 5 MG TABS tablet TAKE 1 TABLET TWICE A DAY (Patient taking differently: Take 5 mg by mouth 2 (two) times daily.) 180 tablet 3   glipiZIDE (GLUCOTROL XL) 10 MG 24 hr tablet Take 1 tablet (10 mg total) by mouth daily with breakfast. 90 tablet 3   Lancets MISC Use as directed once daily  E11.9 100 each 12   metoprolol tartrate (LOPRESSOR) 25 MG tablet TAKE 1 TABLET(25 MG) BY MOUTH TWICE DAILY (Patient taking differently: Take 25 mg by mouth 2 (two) times daily.) 180 tablet 3   OXYGEN Inhale 3-5 L into the lungs daily. 3L Daily  4L Resting  5L Exertion     polyethylene glycol (MIRALAX / GLYCOLAX) 17 g packet Take 17 g by mouth daily as needed. 14 each 0   spironolactone (ALDACTONE) 25 MG tablet Take 0.5 tablets (12.5 mg total) by mouth daily.     tamsulosin (FLOMAX) 0.4 MG CAPS capsule Take 1 capsule (0.4 mg total) by mouth daily after supper. Keep appt for tomorrow for refills 30 capsule 0   torsemide (DEMADEX) 20 MG tablet Take 1 tablet (20 mg total) by mouth daily.     traMADol (ULTRAM) 50 MG tablet Take 1 tablet (50 mg total) by mouth every 6 (six) hours as needed for moderate pain. 10 tablet 0   ammonium lactate (LAC-HYDRIN) 12 % lotion Apply topically. (Patient not taking: Reported on 03/24/2023)     CALMOSEPTINE 0.44-20.6 % OINT Apply topically. (Patient not taking: Reported on 03/24/2023)     Lactobacillus (ACIDOPHILUS) CAPS capsule Take 1 capsule by mouth 2 (two) times daily. (Patient not taking: Reported on 03/24/2023)     Current Facility-Administered Medications on File Prior to Visit  Medication Dose Route Frequency Provider Last Rate Last Admin   cyanocobalamin ((VITAMIN B-12)) injection 1,000 mcg  1,000 mcg Intramuscular Q30 days Corwin Levins, MD   1,000 mcg at 08/06/21 0947        ROS:  All others reviewed and negative.  Objective        PE:  BP  126/78 (BP Location: Left Arm, Patient Position: Sitting, Cuff Size: Normal)   Pulse 100   Temp 98.3 F (36.8 C) (Oral)   Ht 5\' 8"  (1.727 m)   Wt 236 lb (107 kg)   SpO2 98%   BMI 35.88 kg/m                 Constitutional: Pt appears in NAD               HENT: Head: NCAT.  Right Ear: External ear normal.                 Left Ear: External ear normal.                Eyes: . Pupils are equal, round, and reactive to light. Conjunctivae and EOM are normal               Nose: without d/c or deformity               Neck: Neck supple. Gross normal ROM               Cardiovascular: Normal rate and regular rhythm.                 Pulmonary/Chest: Effort normal and breath sounds without rales or wheezing.                Abd:  Soft, NT, ND, + BS, no organomegaly               Neurological: Pt is alert. At baseline orientation, motor grossly intact               Skin: Skin is warm. No rashes, no other new lesions, LE edema - trace bilateral              Psychiatric: Pt behavior is normal without agitation   Micro: none  Cardiac tracings I have personally interpreted today:  none  Pertinent Radiological findings (summarize): none   Lab Results  Component Value Date   WBC 13.2 (H) 03/24/2023   HGB 12.1 (L) 03/24/2023   HCT 38.7 (L) 03/24/2023   PLT 343.0 03/24/2023   GLUCOSE 122 (H) 03/24/2023   CHOL 99 03/24/2023   TRIG 99.0 03/24/2023   HDL 33.70 (L) 03/24/2023   LDLCALC 45 03/24/2023   ALT 15 03/24/2023   AST 16 03/24/2023   NA 139 03/24/2023   K 4.2 03/24/2023   CL 102 03/24/2023   CREATININE 1.56 (H) 03/24/2023   BUN 24 (H) 03/24/2023   CO2 29 03/24/2023   TSH 1.31 03/24/2023   PSA 1.62 10/15/2021   INR 1.9 (H) 01/30/2023   HGBA1C 5.5 03/24/2023   MICROALBUR 7.0 (H) 10/08/2022   Assessment/Plan:  [name] [name] is a 77 y.o. White or Caucasian [1] male with  has a past medical history of Acute right-sided CHF (congestive heart failure) (HCC), Arthritis,  BRONCHITIS, CHRONIC (01/02/2009), COMMON MIGRAINE, Complication of anesthesia, DIABETES MELLITUS, TYPE II, GOUT, HYPERLIPIDEMIA, HYPERTENSION, Long term (current) use of anticoagulants, Morbid obesity (HCC), OBESITY HYPOVENTILATION SYNDROME, On home oxygen therapy, OSA (obstructive sleep apnea), Permanent atrial fibrillation (HCC), PROSTATE SPECIFIC ANTIGEN, ELEVATED (06/09/2007), Pulmonary HTN (HCC), SKIN RASH (03/12/2010), and Unspecified hearing loss.  Recurrent UTI With recent sepsis resolved now finished rehab, asympt,  to f/u any worsening symptoms or concerns  Chronic indwelling Foley catheter Also in need for change or d/c foley - for urology referral  Chronic diastolic CHF (congestive heart failure) (HCC) Overall stable exam today it seems, for f/u cardiology tomorrow  Non-insulin dependent type 2 diabetes mellitus (HCC) Lab Results  Component Value Date   HGBA1C 5.5 03/24/2023   Stable off metformin, pt to continue current medical treatment glucotrol xl 10 qd   Essential hypertension BP Readings from Last 3 Encounters:  03/24/23 126/78  02/22/23 126/78  02/15/23 99/61   Stable, pt to continue medical treatment lopressor 25 bid   Vitamin D deficiency Last vitamin D Lab Results  Component Value Date   VD25OH 71.90 11/19/2022   Stable, cont oral replacement   B12 deficiency Lab Results  Component Value Date   VITAMINB12 596 10/08/2022   Stable, cont oral replacement - b12 1000 mcg qd   Allergic rhinitis Mild to mod, for otc nasacort asd,  to f/u any worsening symptoms or concerns  Followup: Return in about 3 months (around 06/24/2023).  Oliver Barre, MD 03/28/2023 8:21 AM [facility] Medicine

## 2023-03-24 NOTE — Patient Instructions (Signed)
Please continue all other medications as before, and refills have been done if requested.  Please have the pharmacy call with any other refills you may need.  Please continue your efforts at being more active, low cholesterol diabetic diet, and weight control.  Please keep your appointments with your specialists as you may have planned - Dr Jens Som tomorrow  You will be contacted regarding the referral for: Urology  Please go to the LAB at the blood drawing area for the tests to be done  You will be contacted by phone if any changes need to be made immediately.  Otherwise, you will receive a letter about your results with an explanation, but please check with MyChart first.  Please make an Appointment to return in 3 months, or sooner if needed

## 2023-03-25 ENCOUNTER — Ambulatory Visit: Payer: Medicare Other | Admitting: Cardiology

## 2023-03-25 DIAGNOSIS — I130 Hypertensive heart and chronic kidney disease with heart failure and stage 1 through stage 4 chronic kidney disease, or unspecified chronic kidney disease: Secondary | ICD-10-CM | POA: Diagnosis not present

## 2023-03-25 DIAGNOSIS — I5033 Acute on chronic diastolic (congestive) heart failure: Secondary | ICD-10-CM | POA: Diagnosis not present

## 2023-03-25 DIAGNOSIS — E1122 Type 2 diabetes mellitus with diabetic chronic kidney disease: Secondary | ICD-10-CM | POA: Diagnosis not present

## 2023-03-25 DIAGNOSIS — L89152 Pressure ulcer of sacral region, stage 2: Secondary | ICD-10-CM | POA: Diagnosis not present

## 2023-03-25 DIAGNOSIS — N1831 Chronic kidney disease, stage 3a: Secondary | ICD-10-CM | POA: Diagnosis not present

## 2023-03-25 DIAGNOSIS — J449 Chronic obstructive pulmonary disease, unspecified: Secondary | ICD-10-CM | POA: Diagnosis not present

## 2023-03-25 NOTE — Progress Notes (Signed)
The test results show that your current treatment is OK, as the tests are stable.  Please continue the same plan.  There is no other need for change of treatment or further evaluation based on these results, at this time.  thanks 

## 2023-03-28 ENCOUNTER — Encounter: Payer: Self-pay | Admitting: Internal Medicine

## 2023-03-28 DIAGNOSIS — I130 Hypertensive heart and chronic kidney disease with heart failure and stage 1 through stage 4 chronic kidney disease, or unspecified chronic kidney disease: Secondary | ICD-10-CM | POA: Diagnosis not present

## 2023-03-28 DIAGNOSIS — E1122 Type 2 diabetes mellitus with diabetic chronic kidney disease: Secondary | ICD-10-CM | POA: Diagnosis not present

## 2023-03-28 DIAGNOSIS — N390 Urinary tract infection, site not specified: Secondary | ICD-10-CM | POA: Insufficient documentation

## 2023-03-28 DIAGNOSIS — I5033 Acute on chronic diastolic (congestive) heart failure: Secondary | ICD-10-CM | POA: Diagnosis not present

## 2023-03-28 DIAGNOSIS — L89152 Pressure ulcer of sacral region, stage 2: Secondary | ICD-10-CM | POA: Diagnosis not present

## 2023-03-28 DIAGNOSIS — N1831 Chronic kidney disease, stage 3a: Secondary | ICD-10-CM | POA: Diagnosis not present

## 2023-03-28 DIAGNOSIS — J449 Chronic obstructive pulmonary disease, unspecified: Secondary | ICD-10-CM | POA: Diagnosis not present

## 2023-03-28 DIAGNOSIS — Z978 Presence of other specified devices: Secondary | ICD-10-CM | POA: Insufficient documentation

## 2023-03-28 NOTE — Assessment & Plan Note (Signed)
Overall stable exam today it seems, for f/u cardiology tomorrow

## 2023-03-28 NOTE — Assessment & Plan Note (Signed)
Lab Results  Component Value Date   VITAMINB12 596 10/08/2022   Stable, cont oral replacement - b12 1000 mcg qd

## 2023-03-28 NOTE — Assessment & Plan Note (Signed)
Also in need for change or d/c foley - for urology referral

## 2023-03-28 NOTE — Assessment & Plan Note (Signed)
Lab Results  Component Value Date   HGBA1C 5.5 03/24/2023   Stable off metformin, pt to continue current medical treatment glucotrol xl 10 qd

## 2023-03-28 NOTE — Assessment & Plan Note (Signed)
Last vitamin D Lab Results  Component Value Date   VD25OH 71.90 11/19/2022   Stable, cont oral replacement

## 2023-03-28 NOTE — Assessment & Plan Note (Signed)
With recent sepsis resolved now finished rehab, asympt,  to f/u any worsening symptoms or concerns

## 2023-03-28 NOTE — Assessment & Plan Note (Signed)
Mild to mod, for otc nasacort asd, to f/u any worsening symptoms or concerns ?

## 2023-03-28 NOTE — Assessment & Plan Note (Signed)
BP Readings from Last 3 Encounters:  03/24/23 126/78  02/22/23 126/78  02/15/23 99/61   Stable, pt to continue medical treatment lopressor 25 bid

## 2023-03-29 ENCOUNTER — Encounter: Payer: Self-pay | Admitting: Cardiology

## 2023-03-29 DIAGNOSIS — J449 Chronic obstructive pulmonary disease, unspecified: Secondary | ICD-10-CM | POA: Diagnosis not present

## 2023-03-29 DIAGNOSIS — L89152 Pressure ulcer of sacral region, stage 2: Secondary | ICD-10-CM | POA: Diagnosis not present

## 2023-03-29 DIAGNOSIS — I5033 Acute on chronic diastolic (congestive) heart failure: Secondary | ICD-10-CM | POA: Diagnosis not present

## 2023-03-29 DIAGNOSIS — I130 Hypertensive heart and chronic kidney disease with heart failure and stage 1 through stage 4 chronic kidney disease, or unspecified chronic kidney disease: Secondary | ICD-10-CM | POA: Diagnosis not present

## 2023-03-29 DIAGNOSIS — N1831 Chronic kidney disease, stage 3a: Secondary | ICD-10-CM | POA: Diagnosis not present

## 2023-03-29 DIAGNOSIS — E1122 Type 2 diabetes mellitus with diabetic chronic kidney disease: Secondary | ICD-10-CM | POA: Diagnosis not present

## 2023-03-30 DIAGNOSIS — L89152 Pressure ulcer of sacral region, stage 2: Secondary | ICD-10-CM | POA: Diagnosis not present

## 2023-03-30 DIAGNOSIS — J449 Chronic obstructive pulmonary disease, unspecified: Secondary | ICD-10-CM | POA: Diagnosis not present

## 2023-03-30 DIAGNOSIS — N1831 Chronic kidney disease, stage 3a: Secondary | ICD-10-CM | POA: Diagnosis not present

## 2023-03-30 DIAGNOSIS — E1122 Type 2 diabetes mellitus with diabetic chronic kidney disease: Secondary | ICD-10-CM | POA: Diagnosis not present

## 2023-03-30 DIAGNOSIS — I130 Hypertensive heart and chronic kidney disease with heart failure and stage 1 through stage 4 chronic kidney disease, or unspecified chronic kidney disease: Secondary | ICD-10-CM | POA: Diagnosis not present

## 2023-03-30 DIAGNOSIS — I5033 Acute on chronic diastolic (congestive) heart failure: Secondary | ICD-10-CM | POA: Diagnosis not present

## 2023-03-30 MED ORDER — SPIRONOLACTONE 25 MG PO TABS: 12.5000 mg | ORAL_TABLET | Freq: Every day | ORAL | 0 refills | Status: DC

## 2023-03-31 DIAGNOSIS — I5033 Acute on chronic diastolic (congestive) heart failure: Secondary | ICD-10-CM | POA: Diagnosis not present

## 2023-03-31 DIAGNOSIS — N1831 Chronic kidney disease, stage 3a: Secondary | ICD-10-CM | POA: Diagnosis not present

## 2023-03-31 DIAGNOSIS — S22088D Other fracture of T11-T12 vertebra, subsequent encounter for fracture with routine healing: Secondary | ICD-10-CM | POA: Diagnosis not present

## 2023-03-31 DIAGNOSIS — L89152 Pressure ulcer of sacral region, stage 2: Secondary | ICD-10-CM | POA: Diagnosis not present

## 2023-03-31 DIAGNOSIS — I130 Hypertensive heart and chronic kidney disease with heart failure and stage 1 through stage 4 chronic kidney disease, or unspecified chronic kidney disease: Secondary | ICD-10-CM | POA: Diagnosis not present

## 2023-03-31 DIAGNOSIS — E1122 Type 2 diabetes mellitus with diabetic chronic kidney disease: Secondary | ICD-10-CM | POA: Diagnosis not present

## 2023-03-31 DIAGNOSIS — J449 Chronic obstructive pulmonary disease, unspecified: Secondary | ICD-10-CM | POA: Diagnosis not present

## 2023-04-04 DIAGNOSIS — I5033 Acute on chronic diastolic (congestive) heart failure: Secondary | ICD-10-CM | POA: Diagnosis not present

## 2023-04-04 DIAGNOSIS — J449 Chronic obstructive pulmonary disease, unspecified: Secondary | ICD-10-CM | POA: Diagnosis not present

## 2023-04-04 DIAGNOSIS — I130 Hypertensive heart and chronic kidney disease with heart failure and stage 1 through stage 4 chronic kidney disease, or unspecified chronic kidney disease: Secondary | ICD-10-CM | POA: Diagnosis not present

## 2023-04-04 DIAGNOSIS — E1122 Type 2 diabetes mellitus with diabetic chronic kidney disease: Secondary | ICD-10-CM | POA: Diagnosis not present

## 2023-04-04 DIAGNOSIS — N1831 Chronic kidney disease, stage 3a: Secondary | ICD-10-CM | POA: Diagnosis not present

## 2023-04-04 DIAGNOSIS — L89152 Pressure ulcer of sacral region, stage 2: Secondary | ICD-10-CM | POA: Diagnosis not present

## 2023-04-05 DIAGNOSIS — I5033 Acute on chronic diastolic (congestive) heart failure: Secondary | ICD-10-CM | POA: Diagnosis not present

## 2023-04-05 DIAGNOSIS — N1831 Chronic kidney disease, stage 3a: Secondary | ICD-10-CM | POA: Diagnosis not present

## 2023-04-05 DIAGNOSIS — E1122 Type 2 diabetes mellitus with diabetic chronic kidney disease: Secondary | ICD-10-CM | POA: Diagnosis not present

## 2023-04-05 DIAGNOSIS — I130 Hypertensive heart and chronic kidney disease with heart failure and stage 1 through stage 4 chronic kidney disease, or unspecified chronic kidney disease: Secondary | ICD-10-CM | POA: Diagnosis not present

## 2023-04-05 DIAGNOSIS — J449 Chronic obstructive pulmonary disease, unspecified: Secondary | ICD-10-CM | POA: Diagnosis not present

## 2023-04-05 DIAGNOSIS — L89152 Pressure ulcer of sacral region, stage 2: Secondary | ICD-10-CM | POA: Diagnosis not present

## 2023-04-06 DIAGNOSIS — J9611 Chronic respiratory failure with hypoxia: Secondary | ICD-10-CM | POA: Diagnosis not present

## 2023-04-06 DIAGNOSIS — M6281 Muscle weakness (generalized): Secondary | ICD-10-CM | POA: Diagnosis not present

## 2023-04-06 DIAGNOSIS — S32019D Unspecified fracture of first lumbar vertebra, subsequent encounter for fracture with routine healing: Secondary | ICD-10-CM | POA: Diagnosis not present

## 2023-04-06 DIAGNOSIS — I5033 Acute on chronic diastolic (congestive) heart failure: Secondary | ICD-10-CM | POA: Diagnosis not present

## 2023-04-06 DIAGNOSIS — J449 Chronic obstructive pulmonary disease, unspecified: Secondary | ICD-10-CM | POA: Diagnosis not present

## 2023-04-06 DIAGNOSIS — N1831 Chronic kidney disease, stage 3a: Secondary | ICD-10-CM | POA: Diagnosis not present

## 2023-04-06 DIAGNOSIS — I130 Hypertensive heart and chronic kidney disease with heart failure and stage 1 through stage 4 chronic kidney disease, or unspecified chronic kidney disease: Secondary | ICD-10-CM | POA: Diagnosis not present

## 2023-04-06 DIAGNOSIS — R1312 Dysphagia, oropharyngeal phase: Secondary | ICD-10-CM | POA: Diagnosis not present

## 2023-04-06 DIAGNOSIS — L89152 Pressure ulcer of sacral region, stage 2: Secondary | ICD-10-CM | POA: Diagnosis not present

## 2023-04-06 DIAGNOSIS — E1122 Type 2 diabetes mellitus with diabetic chronic kidney disease: Secondary | ICD-10-CM | POA: Diagnosis not present

## 2023-04-07 DIAGNOSIS — J449 Chronic obstructive pulmonary disease, unspecified: Secondary | ICD-10-CM | POA: Diagnosis not present

## 2023-04-07 DIAGNOSIS — I130 Hypertensive heart and chronic kidney disease with heart failure and stage 1 through stage 4 chronic kidney disease, or unspecified chronic kidney disease: Secondary | ICD-10-CM | POA: Diagnosis not present

## 2023-04-07 DIAGNOSIS — L89152 Pressure ulcer of sacral region, stage 2: Secondary | ICD-10-CM | POA: Diagnosis not present

## 2023-04-07 DIAGNOSIS — E1122 Type 2 diabetes mellitus with diabetic chronic kidney disease: Secondary | ICD-10-CM | POA: Diagnosis not present

## 2023-04-07 DIAGNOSIS — N1831 Chronic kidney disease, stage 3a: Secondary | ICD-10-CM | POA: Diagnosis not present

## 2023-04-07 DIAGNOSIS — I5033 Acute on chronic diastolic (congestive) heart failure: Secondary | ICD-10-CM | POA: Diagnosis not present

## 2023-04-08 ENCOUNTER — Telehealth: Payer: Self-pay | Admitting: Internal Medicine

## 2023-04-08 NOTE — Telephone Encounter (Signed)
Called and gave verbals.

## 2023-04-08 NOTE — Telephone Encounter (Signed)
Ok for verbal 

## 2023-04-08 NOTE — Telephone Encounter (Signed)
Pt need more Pt 1 WEEK 3. If you have further questions regarding request please contact Monique [phone]

## 2023-04-11 ENCOUNTER — Other Ambulatory Visit: Payer: Self-pay | Admitting: Internal Medicine

## 2023-04-12 ENCOUNTER — Other Ambulatory Visit: Payer: Self-pay

## 2023-04-12 DIAGNOSIS — I130 Hypertensive heart and chronic kidney disease with heart failure and stage 1 through stage 4 chronic kidney disease, or unspecified chronic kidney disease: Secondary | ICD-10-CM | POA: Diagnosis not present

## 2023-04-12 DIAGNOSIS — N1831 Chronic kidney disease, stage 3a: Secondary | ICD-10-CM | POA: Diagnosis not present

## 2023-04-12 DIAGNOSIS — E1122 Type 2 diabetes mellitus with diabetic chronic kidney disease: Secondary | ICD-10-CM | POA: Diagnosis not present

## 2023-04-12 DIAGNOSIS — L89152 Pressure ulcer of sacral region, stage 2: Secondary | ICD-10-CM | POA: Diagnosis not present

## 2023-04-12 DIAGNOSIS — I5033 Acute on chronic diastolic (congestive) heart failure: Secondary | ICD-10-CM | POA: Diagnosis not present

## 2023-04-12 DIAGNOSIS — J449 Chronic obstructive pulmonary disease, unspecified: Secondary | ICD-10-CM | POA: Diagnosis not present

## 2023-04-13 DIAGNOSIS — J449 Chronic obstructive pulmonary disease, unspecified: Secondary | ICD-10-CM | POA: Diagnosis not present

## 2023-04-13 DIAGNOSIS — I5033 Acute on chronic diastolic (congestive) heart failure: Secondary | ICD-10-CM | POA: Diagnosis not present

## 2023-04-13 DIAGNOSIS — L89152 Pressure ulcer of sacral region, stage 2: Secondary | ICD-10-CM | POA: Diagnosis not present

## 2023-04-13 DIAGNOSIS — N1831 Chronic kidney disease, stage 3a: Secondary | ICD-10-CM | POA: Diagnosis not present

## 2023-04-13 DIAGNOSIS — I130 Hypertensive heart and chronic kidney disease with heart failure and stage 1 through stage 4 chronic kidney disease, or unspecified chronic kidney disease: Secondary | ICD-10-CM | POA: Diagnosis not present

## 2023-04-13 DIAGNOSIS — E1122 Type 2 diabetes mellitus with diabetic chronic kidney disease: Secondary | ICD-10-CM | POA: Diagnosis not present

## 2023-04-14 ENCOUNTER — Encounter: Payer: Self-pay | Admitting: Urology

## 2023-04-14 ENCOUNTER — Ambulatory Visit (INDEPENDENT_AMBULATORY_CARE_PROVIDER_SITE_OTHER): Payer: Medicare Other | Admitting: Urology

## 2023-04-14 VITALS — BP 114/77 | HR 88 | Ht 68.0 in | Wt 240.0 lb

## 2023-04-14 DIAGNOSIS — J449 Chronic obstructive pulmonary disease, unspecified: Secondary | ICD-10-CM | POA: Diagnosis not present

## 2023-04-14 DIAGNOSIS — E1122 Type 2 diabetes mellitus with diabetic chronic kidney disease: Secondary | ICD-10-CM | POA: Diagnosis not present

## 2023-04-14 DIAGNOSIS — Z978 Presence of other specified devices: Secondary | ICD-10-CM | POA: Diagnosis not present

## 2023-04-14 DIAGNOSIS — R8290 Unspecified abnormal findings in urine: Secondary | ICD-10-CM

## 2023-04-14 DIAGNOSIS — R339 Retention of urine, unspecified: Secondary | ICD-10-CM

## 2023-04-14 DIAGNOSIS — I130 Hypertensive heart and chronic kidney disease with heart failure and stage 1 through stage 4 chronic kidney disease, or unspecified chronic kidney disease: Secondary | ICD-10-CM | POA: Diagnosis not present

## 2023-04-14 DIAGNOSIS — I5033 Acute on chronic diastolic (congestive) heart failure: Secondary | ICD-10-CM | POA: Diagnosis not present

## 2023-04-14 DIAGNOSIS — Z87440 Personal history of urinary (tract) infections: Secondary | ICD-10-CM | POA: Insufficient documentation

## 2023-04-14 DIAGNOSIS — L89152 Pressure ulcer of sacral region, stage 2: Secondary | ICD-10-CM | POA: Diagnosis not present

## 2023-04-14 DIAGNOSIS — N1831 Chronic kidney disease, stage 3a: Secondary | ICD-10-CM | POA: Diagnosis not present

## 2023-04-14 NOTE — Progress Notes (Signed)
Assessment: 1. Urinary retention   2. Chronic indwelling Foley catheter   3. History of UTI      Plan: I personally reviewed the patient's chart including provider notes, lab and imaging results. I discussed options for management of his urinary retention including continued Foley catheter, intermittent catheterization, and suprapubic catheter placement.  Pros and cons of each option discussed.  I do not think he would be a candidate for intermittent catheterization. At the present time, he would like to continue the Foley catheter with monthly changes per home health care. Urine sent for culture today.  Will contact with results. Return to office in 6 weeks.   Chief Complaint:  Chief Complaint  Patient presents with   Urinary Retention    History of Present Illness:  [name] [name] is a 77 y.o. male who is seen in consultation from Corwin Levins, MD for evaluation of urinary retention with chronic indwelling foley catheter. He presented to the emergency room on 01/12/2023 with dysuria as well as fever.  He was found to have urinary retention and a Foley catheter was placed.  He reports that he did not have any significant urinary symptoms prior to this episode.  No prior history of retention. CT imaging from 01/12/2023 demonstrated left renal cysts, no renal calculi, mild left hydroureteronephrosis, mildly enlarged prostate. Urine culture grew 20K Klebsiella.   He was started on tamsulosin during his hospital course.  He failed a voiding trial and was discharged on 01/19/2023 with the Foley catheter in place.  He was readmitted to the hospital on 01/30/2023 with hypotension and gross hematuria. Urine culture showed no growth. Renal ultrasound from 01/30/2023 showed no renal mass or hydronephrosis. He again failed a voiding trial during the hospital course.  His Foley was changed on 02/09/2023 and he was discharged to a skilled nursing facility. He is current at home.  Home Health care last  changed his Foley catheter on 03/24/2023.  His catheter has been draining well.  He has noted some dark-colored urine for the past several days.  No recent fevers or chills.   Past Medical History:  Past Medical History:  Diagnosis Date   Acute right-sided CHF (congestive heart failure) (HCC)    a. 01/2013.   Arthritis    "knees and hands; thoracic area of the spine" (01/05/2013)   BRONCHITIS, CHRONIC 01/02/2009   COMMON MIGRAINE    "none since treating for high BP"   Complication of anesthesia    "aspiration pneumonia after hand OR" (01/05/2013)   DIABETES MELLITUS, TYPE II    GOUT    HYPERLIPIDEMIA    HYPERTENSION    Long term (current) use of anticoagulants    Morbid obesity (HCC)    OBESITY HYPOVENTILATION SYNDROME    a. on home O2.   On home oxygen therapy    OSA (obstructive sleep apnea)    Permanent atrial fibrillation (HCC)    PROSTATE SPECIFIC ANTIGEN, ELEVATED 06/09/2007   Pulmonary HTN (HCC)    a. multifactorial including obstructive sleep apnea, obesity hypoventilation syndrome and possible pulmonary venous hypertension.   SKIN RASH 03/12/2010   Unspecified hearing loss     Past Surgical History:  Past Surgical History:  Procedure Laterality Date   APPENDECTOMY  1974   APPLICATION OF ROBOTIC ASSISTANCE FOR SPINAL PROCEDURE N/A 11/23/2022   Procedure: APPLICATION OF ROBOTIC ASSISTANCE FOR SPINAL PROCEDURE;  Surgeon: Lisbeth Renshaw, MD;  Location: [facility] OR;  Service: Neurosurgery;  Laterality: N/A;   CARDIOVERSION  05/21/2008; ~  06/2008   HERNIA REPAIR     INGUINAL HERNIA REPAIR Right    RIGHT HEART CATHETERIZATION N/A 01/08/2013   Procedure: RIGHT HEART CATH;  Surgeon: Vesta Mixer, MD;  Location: [facility] CATH LAB;  Service: Cardiovascular;  Laterality: N/A;   TENDON REPAIR Right 2009   "lacerated his tendon and pulley" Dr. Izora Ribas   UMBILICAL HERNIA REPAIR      Allergies:  Allergies  Allergen Reactions   Codeine     Altered mental status "goofy"   Heparin Rash      Abdominal rash 01/2013    Family History:  Family History  Problem Relation Age of Onset   Alzheimer's disease Father    Prostate cancer Father    Cancer Other        Breast Cancer, <50 yo 1st degree relative   Coronary artery disease Other        Male, 1st degree relative    Social History:  Social History   Tobacco Use   Smoking status: Former    Current packs/day: 0.00    Average packs/day: 1 pack/day for 15.0 years (15.0 ttl pk-yrs)    Types: Cigarettes    Start date: 08/10/1963    Quit date: 08/09/1978    Years since quitting: 44.7   Smokeless tobacco: Never   Tobacco comments:    01/05/2013 "quit smoking ~ 40 years ago"  Vaping Use   Vaping status: Never Used  Substance Use Topics   Alcohol use: Yes    Alcohol/week: 0.0 standard drinks of alcohol    Comment: 01/05/2013 "hasn't had a drink since 1980's; never had problem w/it"   Drug use: No    Review of symptoms:  Constitutional:  Negative for unexplained weight loss, night sweats, fever, chills ENT:  Negative for nose bleeds, sinus pain, painful swallowing CV:  Negative for chest pain, shortness of breath, exercise intolerance, palpitations, loss of consciousness Resp:  Negative for cough, wheezing, shortness of breath GI:  Negative for nausea, vomiting, diarrhea, bloody stools GU:  Positives noted in HPI; otherwise negative for gross hematuria, dysuria, urinary incontinence Neuro:  Negative for seizures, poor balance, limb weakness, slurred speech Psych:  Negative for lack of energy, depression, anxiety Endocrine:  Negative for polydipsia, polyuria, symptoms of hypoglycemia (dizziness, hunger, sweating) Hematologic:  Negative for anemia, purpura, petechia, prolonged or excessive bleeding, use of anticoagulants  Allergic:  Negative for difficulty breathing or choking as a result of exposure to anything; no shellfish allergy; no allergic response (rash/itch) to materials, foods  Physical exam: BP 114/77   Pulse 88    Ht 5\' 8"  (1.727 m)   Wt 240 lb (108.9 kg)   BMI 36.49 kg/m  GENERAL APPEARANCE:  Well appearing, well developed, well nourished, NAD HEENT: Atraumatic, Normocephalic, oropharynx clear. NECK: Supple without lymphadenopathy or thyromegaly. LUNGS: Clear to auscultation bilaterally. HEART: Regular Rate and Rhythm without murmurs, gallops, or rubs. ABDOMEN: Soft, non-tender, No Masses. EXTREMITIES:  Without clubbing, cyanosis, or edema. NEUROLOGIC:  Alert and oriented x 3, in wheelchair, CN II-XII grossly intact.  MENTAL STATUS:  Appropriate. BACK:  Non-tender to palpation.  No CVAT SKIN:  Warm, dry and intact.   GU: Penis:  uncircumcised Meatus: Normal; foley in place draining dark yellow urine Scrotum: normal, no masses Testis: normal without masses bilateral   Results: U/A: 11-30 WBCs, 3-10 RBCs, many bacteria, nitrite negative

## 2023-04-17 LAB — URINE CULTURE

## 2023-04-18 DIAGNOSIS — I130 Hypertensive heart and chronic kidney disease with heart failure and stage 1 through stage 4 chronic kidney disease, or unspecified chronic kidney disease: Secondary | ICD-10-CM | POA: Diagnosis not present

## 2023-04-18 DIAGNOSIS — N1831 Chronic kidney disease, stage 3a: Secondary | ICD-10-CM | POA: Diagnosis not present

## 2023-04-18 DIAGNOSIS — L89152 Pressure ulcer of sacral region, stage 2: Secondary | ICD-10-CM | POA: Diagnosis not present

## 2023-04-18 DIAGNOSIS — I5033 Acute on chronic diastolic (congestive) heart failure: Secondary | ICD-10-CM | POA: Diagnosis not present

## 2023-04-18 DIAGNOSIS — E1122 Type 2 diabetes mellitus with diabetic chronic kidney disease: Secondary | ICD-10-CM | POA: Diagnosis not present

## 2023-04-18 DIAGNOSIS — J449 Chronic obstructive pulmonary disease, unspecified: Secondary | ICD-10-CM | POA: Diagnosis not present

## 2023-04-18 LAB — URINALYSIS, ROUTINE W REFLEX MICROSCOPIC
Bilirubin, UA: NEGATIVE
Glucose, UA: NEGATIVE
Ketones, UA: NEGATIVE
Nitrite, UA: NEGATIVE
Protein,UA: NEGATIVE
Specific Gravity, UA: 1.015 (ref 1.005–1.030)
Urobilinogen, Ur: 0.2 mg/dL (ref 0.2–1.0)
pH, UA: 6 (ref 5.0–7.5)

## 2023-04-18 LAB — MICROSCOPIC EXAMINATION

## 2023-04-20 DIAGNOSIS — L89152 Pressure ulcer of sacral region, stage 2: Secondary | ICD-10-CM | POA: Diagnosis not present

## 2023-04-20 DIAGNOSIS — I130 Hypertensive heart and chronic kidney disease with heart failure and stage 1 through stage 4 chronic kidney disease, or unspecified chronic kidney disease: Secondary | ICD-10-CM | POA: Diagnosis not present

## 2023-04-20 DIAGNOSIS — I5033 Acute on chronic diastolic (congestive) heart failure: Secondary | ICD-10-CM | POA: Diagnosis not present

## 2023-04-20 DIAGNOSIS — J449 Chronic obstructive pulmonary disease, unspecified: Secondary | ICD-10-CM | POA: Diagnosis not present

## 2023-04-20 DIAGNOSIS — N1831 Chronic kidney disease, stage 3a: Secondary | ICD-10-CM | POA: Diagnosis not present

## 2023-04-20 DIAGNOSIS — E1122 Type 2 diabetes mellitus with diabetic chronic kidney disease: Secondary | ICD-10-CM | POA: Diagnosis not present

## 2023-04-25 DIAGNOSIS — I130 Hypertensive heart and chronic kidney disease with heart failure and stage 1 through stage 4 chronic kidney disease, or unspecified chronic kidney disease: Secondary | ICD-10-CM | POA: Diagnosis not present

## 2023-04-25 DIAGNOSIS — L89152 Pressure ulcer of sacral region, stage 2: Secondary | ICD-10-CM | POA: Diagnosis not present

## 2023-04-25 DIAGNOSIS — E1122 Type 2 diabetes mellitus with diabetic chronic kidney disease: Secondary | ICD-10-CM | POA: Diagnosis not present

## 2023-04-25 DIAGNOSIS — N1831 Chronic kidney disease, stage 3a: Secondary | ICD-10-CM | POA: Diagnosis not present

## 2023-04-25 DIAGNOSIS — I5033 Acute on chronic diastolic (congestive) heart failure: Secondary | ICD-10-CM | POA: Diagnosis not present

## 2023-04-25 DIAGNOSIS — J449 Chronic obstructive pulmonary disease, unspecified: Secondary | ICD-10-CM | POA: Diagnosis not present

## 2023-04-27 DIAGNOSIS — E1122 Type 2 diabetes mellitus with diabetic chronic kidney disease: Secondary | ICD-10-CM | POA: Diagnosis not present

## 2023-04-27 DIAGNOSIS — J449 Chronic obstructive pulmonary disease, unspecified: Secondary | ICD-10-CM | POA: Diagnosis not present

## 2023-04-27 DIAGNOSIS — I5033 Acute on chronic diastolic (congestive) heart failure: Secondary | ICD-10-CM | POA: Diagnosis not present

## 2023-04-27 DIAGNOSIS — L89152 Pressure ulcer of sacral region, stage 2: Secondary | ICD-10-CM | POA: Diagnosis not present

## 2023-04-27 DIAGNOSIS — I130 Hypertensive heart and chronic kidney disease with heart failure and stage 1 through stage 4 chronic kidney disease, or unspecified chronic kidney disease: Secondary | ICD-10-CM | POA: Diagnosis not present

## 2023-04-27 DIAGNOSIS — N1831 Chronic kidney disease, stage 3a: Secondary | ICD-10-CM | POA: Diagnosis not present

## 2023-05-06 ENCOUNTER — Other Ambulatory Visit: Payer: TRICARE For Life (TFL)

## 2023-05-08 ENCOUNTER — Other Ambulatory Visit: Payer: Self-pay | Admitting: Internal Medicine

## 2023-05-09 ENCOUNTER — Other Ambulatory Visit: Payer: Self-pay

## 2023-05-10 DIAGNOSIS — I5033 Acute on chronic diastolic (congestive) heart failure: Secondary | ICD-10-CM | POA: Diagnosis not present

## 2023-05-10 DIAGNOSIS — J449 Chronic obstructive pulmonary disease, unspecified: Secondary | ICD-10-CM | POA: Diagnosis not present

## 2023-05-10 DIAGNOSIS — I130 Hypertensive heart and chronic kidney disease with heart failure and stage 1 through stage 4 chronic kidney disease, or unspecified chronic kidney disease: Secondary | ICD-10-CM | POA: Diagnosis not present

## 2023-05-10 DIAGNOSIS — E1122 Type 2 diabetes mellitus with diabetic chronic kidney disease: Secondary | ICD-10-CM | POA: Diagnosis not present

## 2023-05-10 NOTE — Telephone Encounter (Signed)
[name] [name]:  Patient is wanting a new BIPAP.  Please see the settings from the last order 06/25/2022 below:  Patient has OSA or probable OSA (Yes or No): Yes Is the patient currently using BiPAP within the home? (Yes or No): Yes If yes to question 2, what is the current DME provider? [employer] Are there any changes to the order/settings? (if yes, please specify) Yes, Adjust Auto BiPap to IPAP [place] 16, EPAP Min 8, PS 4 If no to question 2, date of sleep study: N/A Date of face-to-face encounter: 06/25/2022 Signs and symptoms or probable OSA, mention all that apply (snoring) (morning  Headaches) (witnessed apneas) (choking) (gasping during sleep): Snoring  Please advise.

## 2023-05-12 ENCOUNTER — Other Ambulatory Visit: Payer: Self-pay

## 2023-05-12 ENCOUNTER — Encounter: Payer: Self-pay | Admitting: Cardiology

## 2023-05-12 DIAGNOSIS — I5032 Chronic diastolic (congestive) heart failure: Secondary | ICD-10-CM | POA: Diagnosis not present

## 2023-05-12 DIAGNOSIS — N1831 Chronic kidney disease, stage 3a: Secondary | ICD-10-CM | POA: Diagnosis not present

## 2023-05-13 LAB — BASIC METABOLIC PANEL
BUN/Creatinine Ratio: 20 (ref 10–24)
BUN: 39 mg/dL — ABNORMAL HIGH (ref 8–27)
CO2: 29 mmol/L (ref 20–29)
Calcium: 10.1 mg/dL (ref 8.6–10.2)
Chloride: 103 mmol/L (ref 96–106)
Creatinine, Ser: 1.95 mg/dL — ABNORMAL HIGH (ref 0.76–1.27)
Glucose: 163 mg/dL — ABNORMAL HIGH (ref 70–99)
Potassium: 5.1 mmol/L (ref 3.5–5.2)
Sodium: 143 mmol/L (ref 134–144)
eGFR: 35 mL/min/{1.73_m2} — ABNORMAL LOW (ref >59)

## 2023-05-13 MED ORDER — SPIRONOLACTONE 25 MG PO TABS: 25.0000 mg | ORAL_TABLET | Freq: Every day | ORAL | 3 refills | Status: DC

## 2023-05-13 NOTE — Addendum Note (Signed)
Addended by: Freddi Starr on: 05/13/2023 01:17 PM   Modules accepted: Orders

## 2023-05-16 ENCOUNTER — Encounter: Payer: Self-pay | Admitting: *Deleted

## 2023-05-17 DIAGNOSIS — I130 Hypertensive heart and chronic kidney disease with heart failure and stage 1 through stage 4 chronic kidney disease, or unspecified chronic kidney disease: Secondary | ICD-10-CM | POA: Diagnosis not present

## 2023-05-17 DIAGNOSIS — I5033 Acute on chronic diastolic (congestive) heart failure: Secondary | ICD-10-CM | POA: Diagnosis not present

## 2023-05-17 DIAGNOSIS — N1831 Chronic kidney disease, stage 3a: Secondary | ICD-10-CM | POA: Diagnosis not present

## 2023-05-17 DIAGNOSIS — J449 Chronic obstructive pulmonary disease, unspecified: Secondary | ICD-10-CM | POA: Diagnosis not present

## 2023-05-17 DIAGNOSIS — E1122 Type 2 diabetes mellitus with diabetic chronic kidney disease: Secondary | ICD-10-CM | POA: Diagnosis not present

## 2023-05-17 DIAGNOSIS — J9621 Acute and chronic respiratory failure with hypoxia: Secondary | ICD-10-CM | POA: Diagnosis not present

## 2023-05-18 ENCOUNTER — Other Ambulatory Visit: Payer: Self-pay

## 2023-05-18 ENCOUNTER — Encounter: Payer: Self-pay | Admitting: Internal Medicine

## 2023-05-18 MED ORDER — ALLOPURINOL 100 MG PO TABS: 100.0000 mg | ORAL_TABLET | Freq: Every day | ORAL | 3 refills | Status: DC

## 2023-05-20 ENCOUNTER — Ambulatory Visit (INDEPENDENT_AMBULATORY_CARE_PROVIDER_SITE_OTHER): Payer: Medicare Other

## 2023-05-20 DIAGNOSIS — M2011 Hallux valgus (acquired), right foot: Secondary | ICD-10-CM | POA: Diagnosis not present

## 2023-05-20 DIAGNOSIS — M2012 Hallux valgus (acquired), left foot: Secondary | ICD-10-CM

## 2023-05-20 DIAGNOSIS — E119 Type 2 diabetes mellitus without complications: Secondary | ICD-10-CM | POA: Diagnosis not present

## 2023-05-20 DIAGNOSIS — M2042 Other hammer toe(s) (acquired), left foot: Secondary | ICD-10-CM | POA: Diagnosis not present

## 2023-05-20 DIAGNOSIS — M2141 Flat foot [pes planus] (acquired), right foot: Secondary | ICD-10-CM

## 2023-05-20 DIAGNOSIS — M2041 Other hammer toe(s) (acquired), right foot: Secondary | ICD-10-CM

## 2023-05-20 DIAGNOSIS — B351 Tinea unguium: Secondary | ICD-10-CM

## 2023-05-20 NOTE — Progress Notes (Signed)

## 2023-06-03 ENCOUNTER — Encounter: Payer: Self-pay | Admitting: Urology

## 2023-06-03 ENCOUNTER — Ambulatory Visit (INDEPENDENT_AMBULATORY_CARE_PROVIDER_SITE_OTHER): Payer: Medicare Other | Admitting: Urology

## 2023-06-03 VITALS — BP 134/83 | HR 73

## 2023-06-03 DIAGNOSIS — Z87440 Personal history of urinary (tract) infections: Secondary | ICD-10-CM

## 2023-06-03 DIAGNOSIS — B356 Tinea cruris: Secondary | ICD-10-CM | POA: Diagnosis not present

## 2023-06-03 DIAGNOSIS — Z978 Presence of other specified devices: Secondary | ICD-10-CM

## 2023-06-03 DIAGNOSIS — R339 Retention of urine, unspecified: Secondary | ICD-10-CM

## 2023-06-03 MED ORDER — NYSTATIN 100000 UNIT/GM EX CREA
1.0000 | TOPICAL_CREAM | Freq: Two times a day (BID) | CUTANEOUS | 2 refills | Status: DC
Start: 2023-06-03 — End: 2023-09-09

## 2023-06-03 NOTE — Progress Notes (Signed)
Assessment: 1. Urinary retention   2. Chronic indwelling Foley catheter   3. History of UTI   4. Tinea cruris     Plan: At the present time, he would like to continue the Foley catheter with monthly changes per home health care. Nystatin cream BID to groin areas Recommend using gold bond powder once rash resolves Continue foley changes monthly Return to office in 3 months  Chief Complaint:  Chief Complaint  Patient presents with   Urinary Retention    History of Present Illness:  [name] [name] is a 77 y.o. male who is seen for further evaluation of urinary retention with chronic indwelling foley catheter. He presented to the emergency room on 01/12/2023 with dysuria as well as fever.  He was found to have urinary retention and a Foley catheter was placed.  He reports that he did not have any significant urinary symptoms prior to this episode.  No prior history of retention. CT imaging from 01/12/2023 demonstrated left renal cysts, no renal calculi, mild left hydroureteronephrosis, mildly enlarged prostate. Urine culture grew 20K Klebsiella.   He was started on tamsulosin during his hospital course.  He failed a voiding trial and was discharged on 01/19/2023 with the Foley catheter in place.  He was readmitted to the hospital on 01/30/2023 with hypotension and gross hematuria. Urine culture showed no growth. Renal ultrasound from 01/30/2023 showed no renal mass or hydronephrosis. He again failed a voiding trial during the hospital course.  His Foley was changed on 02/09/2023 and he was discharged to a skilled nursing facility. Urine culture from 04/17/2023 grew >100 K multiple species.  He returns today for follow-up.  His catheter was last changed by home health on 05/17/2023.  His catheter has been draining well.  No gross hematuria.  No fevers or chills.  He is complaining of a rash with itching in the bilateral groin area.   Past Medical History:  Past Medical History:  Diagnosis Date    Acute right-sided CHF (congestive heart failure) (HCC)    a. 01/2013.   Arthritis    "knees and hands; thoracic area of the spine" (01/05/2013)   BRONCHITIS, CHRONIC 01/02/2009   COMMON MIGRAINE    "none since treating for high BP"   Complication of anesthesia    "aspiration pneumonia after hand OR" (01/05/2013)   DIABETES MELLITUS, TYPE II    GOUT    HYPERLIPIDEMIA    HYPERTENSION    Long term (current) use of anticoagulants    Morbid obesity (HCC)    OBESITY HYPOVENTILATION SYNDROME    a. on home O2.   On home oxygen therapy    OSA (obstructive sleep apnea)    Permanent atrial fibrillation (HCC)    PROSTATE SPECIFIC ANTIGEN, ELEVATED 06/09/2007   Pulmonary HTN (HCC)    a. multifactorial including obstructive sleep apnea, obesity hypoventilation syndrome and possible pulmonary venous hypertension.   SKIN RASH 03/12/2010   Unspecified hearing loss     Past Surgical History:  Past Surgical History:  Procedure Laterality Date   APPENDECTOMY  1974   APPLICATION OF ROBOTIC ASSISTANCE FOR SPINAL PROCEDURE N/A 11/23/2022   Procedure: APPLICATION OF ROBOTIC ASSISTANCE FOR SPINAL PROCEDURE;  Surgeon: Lisbeth Renshaw, MD;  Location: [facility] OR;  Service: Neurosurgery;  Laterality: N/A;   CARDIOVERSION  05/21/2008; ~ 06/2008   HERNIA REPAIR     INGUINAL HERNIA REPAIR Right    RIGHT HEART CATHETERIZATION N/A 01/08/2013   Procedure: RIGHT HEART CATH;  Surgeon: Deloris Ping Nahser,  MD;  Location: [facility] CATH LAB;  Service: Cardiovascular;  Laterality: N/A;   TENDON REPAIR Right 2009   "lacerated his tendon and pulley" Dr. Izora Ribas   UMBILICAL HERNIA REPAIR      Allergies:  Allergies  Allergen Reactions   Codeine     Altered mental status "goofy"   Heparin Rash     Abdominal rash 01/2013    Family History:  Family History  Problem Relation Age of Onset   Alzheimer's disease Father    Prostate cancer Father    Cancer Other        Breast Cancer, <50 yo 1st degree relative   Coronary artery  disease Other        Male, 1st degree relative    Social History:  Social History   Tobacco Use   Smoking status: Former    Current packs/day: 0.00    Average packs/day: 1 pack/day for 15.0 years (15.0 ttl pk-yrs)    Types: Cigarettes    Start date: 08/10/1963    Quit date: 08/09/1978    Years since quitting: 44.8   Smokeless tobacco: Never   Tobacco comments:    01/05/2013 "quit smoking ~ 40 years ago"  Vaping Use   Vaping status: Never Used  Substance Use Topics   Alcohol use: Yes    Alcohol/week: 0.0 standard drinks of alcohol    Comment: 01/05/2013 "hasn't had a drink since 1980's; never had problem w/it"   Drug use: No    ROS: Constitutional:  Negative for fever, chills, weight loss CV: Negative for chest pain, previous MI, hypertension Respiratory:  Negative for shortness of breath, wheezing, sleep apnea, frequent cough GI:  Negative for nausea, vomiting, bloody stool, GERD  Physical exam: BP 134/83   Pulse 73  GENERAL APPEARANCE:  Well appearing, well developed, well nourished, NAD HEENT:  Atraumatic, normocephalic, oropharynx clear NECK:  Supple without lymphadenopathy or thyromegaly ABDOMEN:  Soft, non-tender, no masses EXTREMITIES:  Moves all extremities well, without clubbing, cyanosis, or edema NEUROLOGIC:  Alert and oriented x 3, in wheelchair, CN II-XII grossly intact MENTAL STATUS:  appropriate BACK:  Non-tender to palpation, No CVAT SKIN:  Warm, dry, and intact GU:  foley in place draining clear urine; erythematous rash of bilateral groin areas   Results: None

## 2023-06-10 ENCOUNTER — Encounter: Payer: Self-pay | Admitting: Nurse Practitioner

## 2023-06-10 ENCOUNTER — Telehealth: Payer: Medicare Other | Admitting: Nurse Practitioner

## 2023-06-10 DIAGNOSIS — G4733 Obstructive sleep apnea (adult) (pediatric): Secondary | ICD-10-CM

## 2023-06-10 DIAGNOSIS — I5032 Chronic diastolic (congestive) heart failure: Secondary | ICD-10-CM

## 2023-06-10 DIAGNOSIS — J9611 Chronic respiratory failure with hypoxia: Secondary | ICD-10-CM

## 2023-06-10 DIAGNOSIS — J9612 Chronic respiratory failure with hypercapnia: Secondary | ICD-10-CM

## 2023-06-10 NOTE — Progress Notes (Signed)
Patient ID: [name] [name], male     DOB: [birth year], 77 y.o.      MRN: [id number]  No chief complaint on file.   Virtual Visit via Video Note  I connected with [name] [name] on 06/10/23 at  3:00 PM EDT by a video enabled telemedicine application and verified that I am speaking with the correct person using two identifiers.  Location: Patient: Home Provider: Office   I discussed the limitations of evaluation and management by telemedicine and the availability of in person appointments. The patient expressed understanding and agreed to proceed.  History of Present Illness: 77 year old male, former smoker (quit 1980) followed for severe OSA on BiPAP and chronic hypoxic/hypercapnic respiratory failure secondary to obesity hypoventilation syndrome. He is a patient of [name] and last seen in office on 06/25/2022 by [name]. Past medical history CHF, PAF, HTN, allergic rhinitis, DM, HLD, morbid obesity.    TEST/EVENTS:  02/09/2008 ABG: pH 7.37, pCO2 55.1, pO2 76.4 06/18/2008 PSG: AHI 64>> BiPAP 18/9 with 4 L oxygen 04/03/2009 spirometry: FEV1 2.08 (69%), FEV1% 77 01/18/2013 Ono on BiPAP: Basal SpO2 91.7%, low SpO2 83%; spent 32 minutes with SpO2 less than 88% 07/31/2019 BiPAP titration: BiPAP 16/12 with 4 L supplemental O2   07/19/2019: OV with Dr. Craige Cotta.  He was told he needed a follow-up visit for oxygen and BiPAP renewal.  Ambulatory oximetry today on room air; dropped to 86% and recovered to 90s with addition supplemental oxygen 4 L pulsed.  BiPAP titration study ordered for BiPAP/oxygen renewal.  Excellent compliance with BiPAP.   03/19/2022: OV with Lamark Schue NP for overdue follow-up.  They also had some concerns regarding his oxygen.  Reported that about a month ago he was dropping into the mid to high 80s, despite his 5 L pulsed.  They increased him to 6 L which seemed to correct this.  At the same time, he was also noted to have increased weight gain and increased shortness of breath.  He was seen by  cardiology, who adjusted his torsemide with has corrected his volume status.  Today, he reports feeling better.  Feels like his breathing is at his baseline.  Has not had any more issues with oxygen dropping into the 80s at home.  Walking oximetry at OV - able to maintain saturations on 4 lpm POC. Excellent compliance with BiPAP.   06/25/2022: Today - follow up Patient presents today for follow-up with his daughter.  He has been doing well since he was here last.  No further issues with weight gain or lower extremity swelling.  He also feels like his breathing has been stable.  He has been using 4 L/min supplemental O2, which has been his baseline.  No increased cough, chest congestion, orthopnea, PND.  He is wearing BiPAP nightly without fail.  No current issues.  Does feel well controlled on this. 05/25/2022-06/23/2022 BiPAP auto IPAP Alonzo 20, EPAP min 8, PS 4 30/30 days; 100% >4 hr; av use 13 hours Leaks median 0, 95th 76.9 IPAP pressure 95th 14.9, EPAP 10.9 AHI 5  06/10/2023: Today - follow up Discussed the use of AI scribe software for clinical note transcription with the patient, who gave verbal consent to proceed.  History of Present Illness   [name] [name], a patient with a history of multiple hospitalizations, most recently due to acute hypoxic respiratory failure secondary to acute on chronic heart failure and UTI sepsis, presents for a follow-up visit of OSA on BiPAP. His  daughter helps to provide his history. The patient has been using a BiPAP machine, which is due for replacement. Despite some leaks, the patient has been managing well with the machine and reports no significant issues. The patient's energy levels during the day are satisfactory and he reports sleeping well at night with the BiPAP machine. The patient uses a full face mask with the BiPAP machine. He does not drive. Denies morning headaches, sleep parasomnias/paralysis.   Since the last hospitalization in June 2024, the patient  has been slowly improving. After a brief stay in a rehabilitation facility, the patient returned home at the end of July. The patient's strength appears to be returning to normal and breathing has been reported as fine. The patient is currently on three liters of oxygen during the day and at night with the BiPAP machine. He was previously on 4 lpm. Oxygen saturation during our visit was 99% on 3 lpm. Denies any significant cough, chest congestion, increased leg swelling, orthopnea.      05/10/2023-06/08/2023 BiPAP auto 16/8, PS 4 28/30 days; 93% >4 hr; average use 10 hr 46 min Pressure 95th IPAP 13.2, EPAP 9.2 Leaks 89.7 AHI 2.8  Allergies  Allergen Reactions   Codeine     Altered mental status "goofy"   Heparin Rash     Abdominal rash 01/2013   Immunization History  Administered Date(s) Administered   Fluad Quad(high Dose 65+) 05/10/2019, 06/06/2020, 04/16/2021, 04/30/2022   H1N1 07/15/2008   Influenza Split 05/17/2011, 06/09/2012   Influenza Whole 05/09/2008, 06/24/2009, 06/26/2010   Influenza, High Dose Seasonal PF 04/15/2015, 04/19/2017, 04/25/2018   Influenza,inj,Quad PF,6+ Mos 04/05/2013, 04/11/2014, 04/13/2016   PFIZER(Purple Top)SARS-COV-2 Vaccination 10/07/2019, 10/31/2019, 08/30/2020   Pneumococcal Conjugate-13 06/01/2013, 10/09/2013   Pneumococcal Polysaccharide-23 02/07/2008, 10/19/2017   Td 02/07/2008   Tdap 04/25/2018   Zoster Recombinant(Shingrix) 04/06/2019   Zoster, Live 07/02/2008   Past Medical History:  Diagnosis Date   Acute right-sided CHF (congestive heart failure) (HCC)    a. 01/2013.   Arthritis    "knees and hands; thoracic area of the spine" (01/05/2013)   BRONCHITIS, CHRONIC 01/02/2009   COMMON MIGRAINE    "none since treating for high BP"   Complication of anesthesia    "aspiration pneumonia after hand OR" (01/05/2013)   DIABETES MELLITUS, TYPE II    GOUT    HYPERLIPIDEMIA    HYPERTENSION    Long term (current) use of anticoagulants    Morbid  obesity (HCC)    OBESITY HYPOVENTILATION SYNDROME    a. on home O2.   On home oxygen therapy    OSA (obstructive sleep apnea)    Permanent atrial fibrillation (HCC)    PROSTATE SPECIFIC ANTIGEN, ELEVATED 06/09/2007   Pulmonary HTN (HCC)    a. multifactorial including obstructive sleep apnea, obesity hypoventilation syndrome and possible pulmonary venous hypertension.   SKIN RASH 03/12/2010   Unspecified hearing loss     Tobacco History: Social History   Tobacco Use  Smoking Status Former   Current packs/day: 0.00   Average packs/day: 1 pack/day for 15.0 years (15.0 ttl pk-yrs)   Types: Cigarettes   Start date: 08/10/1963   Quit date: 08/09/1978   Years since quitting: 44.8  Smokeless Tobacco Never  Tobacco Comments   01/05/2013 "quit smoking ~ 40 years ago"   Counseling given: Not Answered Tobacco comments: 01/05/2013 "quit smoking ~ 40 years ago"   Outpatient Medications Prior to Visit  Medication Sig Dispense Refill   acetaminophen (TYLENOL) 500 MG tablet Take  500-1,000 mg by mouth every 6 (six) hours as needed for moderate pain (pain score 4-6).     allopurinol (ZYLOPRIM) 100 MG tablet Take 1 tablet (100 mg total) by mouth daily. 90 tablet 3   ammonium lactate (LAC-HYDRIN) 12 % lotion Apply topically.     atorvastatin (LIPITOR) 20 MG tablet TAKE 1 TABLET DAILY (Patient taking differently: Take 20 mg by mouth at bedtime.) 90 tablet 3   CALMOSEPTINE 0.44-20.6 % OINT Apply topically.     cholecalciferol (VITAMIN D3) 25 MCG (1000 UNIT) tablet Take 2,000 Units by mouth daily.     ELIQUIS 5 MG TABS tablet TAKE 1 TABLET TWICE A DAY (Patient taking differently: Take 5 mg by mouth 2 (two) times daily.) 180 tablet 3   glipiZIDE (GLUCOTROL XL) 10 MG 24 hr tablet Take 1 tablet (10 mg total) by mouth daily with breakfast. 90 tablet 3   Lactobacillus (ACIDOPHILUS) CAPS capsule Take 1 capsule by mouth 2 (two) times daily. (Patient not taking: Reported on 06/03/2023)     Lancets MISC Use as  directed once daily  E11.9 100 each 12   metoprolol tartrate (LOPRESSOR) 25 MG tablet TAKE 1 TABLET(25 MG) BY MOUTH TWICE DAILY (Patient taking differently: Take 25 mg by mouth 2 (two) times daily.) 180 tablet 3   nystatin cream (MYCOSTATIN) Apply 1 Application topically 2 (two) times daily. 30 g 2   OXYGEN Inhale 3-5 L into the lungs daily. 3L Daily  4L Resting  5L Exertion     polyethylene glycol (MIRALAX / GLYCOLAX) 17 g packet Take 17 g by mouth daily as needed. 14 each 0   spironolactone (ALDACTONE) 25 MG tablet Take 1 tablet (25 mg total) by mouth daily. 90 tablet 3   tamsulosin (FLOMAX) 0.4 MG CAPS capsule TAKE 1 CAPSULE BY MOUTH EVERY DAY AFTER SUPPER 30 capsule 0   torsemide (DEMADEX) 20 MG tablet Take 1 tablet (20 mg total) by mouth daily.     traMADol (ULTRAM) 50 MG tablet Take 1 tablet (50 mg total) by mouth every 6 (six) hours as needed for moderate pain. 10 tablet 0   Facility-Administered Medications Prior to Visit  Medication Dose Route Frequency Provider Last Rate Last Admin   cyanocobalamin ((VITAMIN B-12)) injection 1,000 mcg  1,000 mcg Intramuscular Q30 days Corwin Levins, MD   1,000 mcg at 08/06/21 1610     Review of Systems:   Constitutional: No weight loss or gain, night sweats, fevers, chills. +improving fatigue, lassitude. HEENT: No headaches, difficulty swallowing, tooth/dental problems, or sore throat. No sneezing, itching, ear ache, nasal congestion, or post nasal drip CV:  +swelling in lower extremities (baseline). No chest pain, orthopnea, PND, anasarca, dizziness, palpitations, syncope Resp: +shortness of breath with exertion (baseline). No excess mucus or change in color of mucus. No productive or non-productive. No hemoptysis. No wheezing.  No chest wall deformity Skin: No rash, lesions, ulcerations MSK:  No joint pain or swelling.  No decreased range of motion.  No back pain. Neuro: No dizziness or lightheadedness.  Psych: No depression or anxiety. Mood  stable.   Observations/Objective: Patient is well-developed, chronically-ill appearing, in no acute distress. A&Ox3. Hard of hearing. Resting comfortably at home. Unlabored breathing. Speech is clear and coherent with logical content.    Assessment and Plan:    Sleep Apnea Severe OSA/OHS. Patient is compliant with BiPAP therapy and reports good energy levels and sleep quality. Excellent control. Receives benefit from use. Mask leaks noted but not affecting control or  efficacy. Due for a new machine - will send orders today. Aware of proper care/use. Understands risks of untreated OSA.  -Order new BiPAP machine. -Continue current BiPAP settings.  Oxygen Therapy Patient is on 3L of oxygen during the day and with BiPAP at night. -Order overnight oxygen study on BiPAP/3 lpm to ensure adequate oxygenation on current settings. -Continue oxygen at 3L for goal >88-90%  Congestive Heart Failure Patient had a hospitalization in June 2024 for acute exacerbation, but has been doing well since discharge. -Continue current management and follow-up with cardiology.  Follow up Plans -Follow-up in 12 weeks months. If symptoms do not improve or worsen, please contact office for sooner follow up or seek emergency care.  -If not contacted by DME for overnight oxygen study in 2-3 weeks, reach out to the office.         I discussed the assessment and treatment plan with the patient. The patient was provided an opportunity to ask questions and all were answered. The patient agreed with the plan and demonstrated an understanding of the instructions.   The patient was advised to call back or seek an in-person evaluation if the symptoms worsen or if the condition fails to improve as anticipated.  I provided 31 minutes of non-face-to-face time during this encounter.   Noemi Chapel, NP

## 2023-06-10 NOTE — Patient Instructions (Signed)
Continue to use BiPAP every night with supplemental oxygen, minimum of 4-6 hours a night.  Change equipment as directed. Wash your tubing with warm soap and water daily, hang to dry. Wash humidifier portion weekly. Use bottled, distilled water and change daily Be aware of reduced alertness and do not drive or operate heavy machinery if experiencing this or drowsiness.  Exercise encouraged, as tolerated. Healthy weight management discussed.  Avoid or decrease alcohol consumption and medications that make you more sleepy, if possible. Notify if persistent daytime sleepiness occurs even with consistent use of PAP therapy.  Continue supplemental oxygen 3 lpm for goal >88-90% We will have you complete an overnight oxygen study on BiPAP and 3 lpm oxygen to ensure this corrects your oxygen levels. Someone should contact you to schedule this in the next few weeks. Please call if you haven't heard anything  Orders sent for new BiPAP machine   Follow up with cardiology as scheduled  Follow up in 12 weeks with Dr. Wynona Neat or Philis Nettle. If symptoms worsen, please contact office for sooner follow up or seek emergency care.

## 2023-06-11 ENCOUNTER — Other Ambulatory Visit: Payer: Self-pay | Admitting: Internal Medicine

## 2023-06-11 ENCOUNTER — Other Ambulatory Visit: Payer: Self-pay | Admitting: Cardiology

## 2023-06-12 ENCOUNTER — Other Ambulatory Visit: Payer: Self-pay | Admitting: Internal Medicine

## 2023-06-13 ENCOUNTER — Other Ambulatory Visit: Payer: Self-pay

## 2023-06-13 NOTE — Telephone Encounter (Signed)
Prescription refill request for Eliquis received. Indication:afib Last office visit:12/23 Scr:1.95  10/24 Age: 77 Weight:108.9  kg  Prescription refilled

## 2023-06-14 ENCOUNTER — Other Ambulatory Visit: Payer: Self-pay | Admitting: *Deleted

## 2023-06-14 ENCOUNTER — Encounter: Payer: Self-pay | Admitting: Internal Medicine

## 2023-06-14 ENCOUNTER — Other Ambulatory Visit: Payer: Self-pay

## 2023-06-14 ENCOUNTER — Encounter: Payer: Self-pay | Admitting: Cardiology

## 2023-06-14 DIAGNOSIS — I4821 Permanent atrial fibrillation: Secondary | ICD-10-CM

## 2023-06-14 DIAGNOSIS — I5033 Acute on chronic diastolic (congestive) heart failure: Secondary | ICD-10-CM | POA: Diagnosis not present

## 2023-06-14 DIAGNOSIS — N390 Urinary tract infection, site not specified: Secondary | ICD-10-CM | POA: Diagnosis not present

## 2023-06-14 DIAGNOSIS — E1122 Type 2 diabetes mellitus with diabetic chronic kidney disease: Secondary | ICD-10-CM | POA: Diagnosis not present

## 2023-06-14 DIAGNOSIS — J449 Chronic obstructive pulmonary disease, unspecified: Secondary | ICD-10-CM | POA: Diagnosis not present

## 2023-06-14 DIAGNOSIS — J9621 Acute and chronic respiratory failure with hypoxia: Secondary | ICD-10-CM | POA: Diagnosis not present

## 2023-06-14 DIAGNOSIS — I130 Hypertensive heart and chronic kidney disease with heart failure and stage 1 through stage 4 chronic kidney disease, or unspecified chronic kidney disease: Secondary | ICD-10-CM | POA: Diagnosis not present

## 2023-06-14 DIAGNOSIS — N1831 Chronic kidney disease, stage 3a: Secondary | ICD-10-CM | POA: Diagnosis not present

## 2023-06-14 MED ORDER — ATORVASTATIN CALCIUM 20 MG PO TABS: 20.0000 mg | ORAL_TABLET | Freq: Every day | ORAL | 3 refills | Status: DC

## 2023-06-14 MED ORDER — APIXABAN 5 MG PO TABS
5.0000 mg | ORAL_TABLET | Freq: Two times a day (BID) | ORAL | 1 refills | Status: DC
Start: 2023-06-14 — End: 2023-08-17

## 2023-06-14 NOTE — Telephone Encounter (Signed)
Eliquis 5mg  refill request received. Patient is 77 years old, weight-108.9kg, Crea-1.95 on 05/12/23, Diagnosis-Afib, and [facility] on 02/22/23.  Dose is appropriate based on dosing criteria. Will send in refill to requested pharmacy.    Refill was sent on yesterday to mail order pt is requesting local.

## 2023-06-17 NOTE — Progress Notes (Unsigned)
Cardiology Clinic Note   Patient Name: [name] [name] Date of Encounter: 06/17/2023  Primary Care Provider:  Corwin Levins, MD Primary Cardiologist:  [name] Millers, MD  Patient Profile    [name] [name] 77 year old male presents to the clinic today for follow-up evaluation of his atrial fibrillation and essential hypertension.   Past Medical History    Past Medical History:  Diagnosis Date   Acute right-sided CHF (congestive heart failure) (HCC)    a. 01/2013.   Arthritis    "knees and hands; thoracic area of the spine" (01/05/2013)   BRONCHITIS, CHRONIC 01/02/2009   COMMON MIGRAINE    "none since treating for high BP"   Complication of anesthesia    "aspiration pneumonia after hand OR" (01/05/2013)   DIABETES MELLITUS, TYPE II    GOUT    HYPERLIPIDEMIA    HYPERTENSION    Long term (current) use of anticoagulants    Morbid obesity (HCC)    OBESITY HYPOVENTILATION SYNDROME    a. on home O2.   On home oxygen therapy    OSA (obstructive sleep apnea)    Permanent atrial fibrillation (HCC)    PROSTATE SPECIFIC ANTIGEN, ELEVATED 06/09/2007   Pulmonary HTN (HCC)    a. multifactorial including obstructive sleep apnea, obesity hypoventilation syndrome and possible pulmonary venous hypertension.   SKIN RASH 03/12/2010   Unspecified hearing loss    Past Surgical History:  Procedure Laterality Date   APPENDECTOMY  1974   APPLICATION OF ROBOTIC ASSISTANCE FOR SPINAL PROCEDURE N/A 11/23/2022   Procedure: APPLICATION OF ROBOTIC ASSISTANCE FOR SPINAL PROCEDURE;  Surgeon: [name] Renshaw, MD;  Location: [facility] OR;  Service: Neurosurgery;  Laterality: N/A;   CARDIOVERSION  05/21/2008; ~ 06/2008   HERNIA REPAIR     INGUINAL HERNIA REPAIR Right    RIGHT HEART CATHETERIZATION N/A 01/08/2013   Procedure: RIGHT HEART CATH;  Surgeon: [name] Mixer, MD;  Location: [facility] CATH LAB;  Service: Cardiovascular;  Laterality: N/A;   TENDON REPAIR Right 2009   "lacerated his tendon and pulley" Dr. Izora [name]    UMBILICAL HERNIA REPAIR      Allergies  Allergies  Allergen Reactions   Codeine     Altered mental status "goofy"   Heparin Rash     Abdominal rash 01/2013    History of Present Illness    [name] [name] has a PMH of chronic diastolic CHF, atrial fibrillation.  Severe obesity, essential hypertension HLD.  He failed DCCV.  Nuclear stress test 7/9.  Showed an EF of 54%, normal perfusion.  Cardiac event monitor 2/12 showed A-fib with mildly decreased rate.  His right heart failure was felt to be secondary to pulmonary hypertension which was multifactorial OSA, OHS and possible pulmonary venous hypertension.  He had a right heart, 6/14 which showed elevated right heart pressures.  He was noted to have renal insufficiency and hyperkalemia on combination of ACE and spironolactone.  His echocardiogram 12/22 showed normal LV function, moderate by atrial enlargement.  He reported unchanged chronic dyspnea with unchanged exertion.  He denied orthopnea and PND.  He did  have some lower extremity swelling which was noted to be worse bilaterally.  He denied chest pain, palpitations, syncope, bleeding issues.  His Demadex was increased to 60 mg twice daily.  He presented to the clinic 09/21/21 for follow-up evaluation stated he felt well.  He felt that his breathing had returned to its baseline.  He  continued to monitor his fluid consumption.  I recommended that  he continue to restrict his fluids to 48-64 ounces or less daily.  He reported that he continued to have dry mouth.  We discussed options for using Biotene, sugar-free gum and sugar-free candy.  His dry weight appeared to be around 275 -276 pounds.  We reviewed the importance of daily weights and early reporting of weight gain.  He and his daughter expressed understanding.  We reviewed his last clinic visit.  He continued to use 5 L nasal cannula and reported compliance with his CPAP at night.  I continued his  medication regimen, gave  salty 6 diet sheet, and  planned follow-up in 4 months.  He was excited that he was a great grandfather.  He was seen in follow-up by Dr. Jens [name] on 07/22/2022.  He remained on 6 L of oxygen.  His dyspnea was stable.  His lower extremity pedal edema was unchanged.  He denied chest pain and palpitations.  He presents to the clinic today for follow-up evaluation and states***.  Today he denies chest pain, shortness of breath, lower extremity edema, fatigue, palpitations, melena, hematuria, hemoptysis, diaphoresis, weakness, presyncope, syncope, orthopnea, and PND.     Home Medications    Prior to Admission medications   Medication Sig Start Date End Date Taking? Authorizing Provider  allopurinol (ZYLOPRIM) 100 MG tablet TAKE 1 TABLET DAILY 12/08/20   [name] Levins, MD  apixaban (ELIQUIS) 5 MG TABS tablet Take 1 tablet (5 mg total) by mouth 2 (two) times daily. 07/21/21   [name] Bunting, MD  atorvastatin (LIPITOR) 20 MG tablet TAKE 1 TABLET DAILY 12/08/20   [name] Levins, MD  cetirizine (ZYRTEC) 10 MG tablet Take 10 mg by mouth at bedtime.    [provider]  cholecalciferol (VITAMIN D3) 25 MCG (1000 UNIT) tablet Take 2,000 Units by mouth daily.    [provider]  cyanocobalamin (,VITAMIN B-12,) 1000 MCG/ML injection Inject 1,000 mcg into the muscle.    [provider]  diltiazem (CARDIZEM CD) 180 MG 24 hr capsule TAKE 1 CAPSULE DAILY (NEED OFFICE VISIT) 12/08/20   [name] Levins, MD  diltiazem [facility]) 180 MG 24 hr capsule  10/03/19   [provider]  glipiZIDE (GLUCOTROL XL) 10 MG 24 hr tablet Take 1 tablet (10 mg total) by mouth daily with breakfast. 07/09/21   [name] Levins, MD  glucose blood (ONE TOUCH ULTRA TEST) test strip Use to check blood sugars once daily Dx E11.9 05/13/17   [name] Levins, MD  Lancets MISC Use as directed once daily  E11.9 04/19/17   [name] Levins, MD  metFORMIN (GLUCOPHAGE-XR) 500 MG 24 hr tablet TAKE 2 TABLETS IN THE MORNING 09/15/20   [name] Levins, MD   metoprolol tartrate (LOPRESSOR) 25 MG tablet Take 1 tablet (25 mg total) by mouth 2 (two) times daily. 07/09/21   [name] Levins, MD  Misc Natural Products [facility] ADVANCED) CAPS Take 1 capsule by mouth daily.    [provider]  OXYGEN Inhale 3-5 L into the lungs daily. 3L Daily  4L Resting  5L Exertion    [provider]  Potassium Chloride ER 20 MEQ TBCR 20 mg. 10/03/19   [provider]  potassium chloride SA (KLOR-CON) 20 MEQ tablet  09/25/19   [provider]  potassium chloride SA (KLOR-CON) 20 MEQ tablet  09/14/20   [provider]  potassium chloride SA (KLOR-CON) 20 MEQ tablet TAKE 1 TABLET TWICE A DAY 12/08/20   Oliver Barre  W, MD  tizanidine (ZANAFLEX) 2 MG capsule Take 1 capsule (2 mg total) by mouth 3 (three) times daily. Patient not taking: Reported on 09/08/2021 06/20/20   Olive Bass, FNP  torsemide (DEMADEX) 20 MG tablet Take 3 tablets (60 mg total) by mouth 2 (two) times daily. 09/08/21   [name] Bunting, MD  Zoster Vaccine Adjuvanted [facility]) injection  06/13/19   [provider]    Family History    Family History  Problem Relation Age of Onset   Alzheimer's disease Father    Prostate cancer Father    Cancer Other        Breast Cancer, <50 yo 1st degree relative   Coronary artery disease Other        Male, 1st degree relative   He indicated that his mother is deceased. He indicated that his father is deceased. He indicated that his brother is deceased. He indicated that his maternal grandmother is deceased. He indicated that his maternal grandfather is deceased. He indicated that his paternal grandmother is deceased. He indicated that his paternal grandfather is deceased. He indicated that the status of his other is unknown.  Social History    Social History   Socioeconomic History   Marital status: Widowed    Spouse name: Not on file   Number of children: 2   Years of education: Not on file    Highest education level: Associate degree: academic program  Occupational History   Occupation: Marine scientist: [employer]  Tobacco Use   Smoking status: Former    Current packs/day: 0.00    Average packs/day: 1 pack/day for 15.0 years (15.0 ttl pk-yrs)    Types: Cigarettes    Start date: 08/10/1963    Quit date: 08/09/1978    Years since quitting: 44.8   Smokeless tobacco: Never   Tobacco comments:    01/05/2013 "quit smoking ~ 40 years ago"  Vaping Use   Vaping status: Never Used  Substance and Sexual Activity   Alcohol use: Yes    Alcohol/week: 0.0 standard drinks of alcohol    Comment: 01/05/2013 "hasn't had a drink since 1980's; never had problem w/it"   Drug use: No   Sexual activity: Not Currently  Other Topics Concern   Not on file  Social History Narrative   Not on file   Social Determinants of Health   Financial Resource Strain: Low Risk  (03/24/2023)   Overall Financial Resource Strain (CARDIA)    Difficulty of Paying Living Expenses: Not hard at all  Food Insecurity: No Food Insecurity (03/24/2023)   Hunger Vital Sign    Worried About Running Out of Food in the Last Year: Never true    Ran Out of Food in the Last Year: Never true  Transportation Needs: No Transportation Needs (03/24/2023)   PRAPARE - Administrator, Civil Service (Medical): No    Lack of Transportation (Non-Medical): No  Physical Activity: Unknown (03/24/2023)   Exercise Vital Sign    Days of Exercise per Week: 0 days    Minutes of Exercise per Session: Not on file  Stress: No Stress Concern Present (03/24/2023)   [employer] of Occupational Health - Occupational Stress Questionnaire    Feeling of Stress : Not at all  Social Connections: Moderately Integrated (03/24/2023)   Social Connection and Isolation Panel [NHANES]    Frequency of Communication with Friends and Family: Patient declined    Frequency of Social Gatherings with  Friends and Family: More than  three times a week    Attends Religious Services: More than 4 times per year    Active Member of Clubs or Organizations: Yes    Attends Banker Meetings: More than 4 times per year    Marital Status: Widowed  Intimate Partner Violence: Not At Risk (01/31/2023)   Humiliation, Afraid, Rape, and Kick questionnaire    Fear of Current or Ex-Partner: No    Emotionally Abused: No    Physically Abused: No    Sexually Abused: No     Review of Systems    General:  No chills, fever, night sweats or weight changes.  Cardiovascular:  No chest pain, dyspnea on exertion, edema, orthopnea, palpitations, paroxysmal nocturnal dyspnea. Dermatological: No rash, lesions/masses Respiratory: No cough, dyspnea Urologic: No hematuria, dysuria Abdominal:   No nausea, vomiting, diarrhea, bright red blood per rectum, melena, or hematemesis Neurologic:  No visual changes, wkns, changes in mental status. All other systems reviewed and are otherwise negative except as noted above.  Physical Exam    VS:  There were no vitals taken for this visit. , BMI There is no height or weight on file to calculate BMI. GEN: Well nourished, well developed, in no acute distress. HEENT: normal. Neck: Supple, no JVD, carotid bruits, or masses. Cardiac: RRR, no murmurs, rubs, or gallops. No clubbing, cyanosis, edema.  Radials/DP/PT 2+ and equal bilaterally.  Respiratory:  Respirations regular and unlabored, clear to auscultation bilaterally. GI: Soft, nontender, nondistended, BS + x 4. MS: no deformity or atrophy. Skin: warm and dry, no rash. Neuro:  Strength and sensation are intact. Psych: Normal affect.  Accessory Clinical Findings    Recent Labs: 02/04/2023: Magnesium 2.0 02/22/2023: B Natriuretic Peptide 75.5 03/24/2023: ALT 15; Hemoglobin 12.1; Platelets 343.0; Pro B Natriuretic peptide (BNP) 63.0; TSH 1.31 05/12/2023: BUN 39; Creatinine, Ser 1.95; Potassium 5.1; Sodium 143   Recent Lipid Panel     Component Value Date/Time   CHOL 99 03/24/2023 1144   CHOL 114 09/13/2019 1047   TRIG 99.0 03/24/2023 1144   HDL 33.70 (L) 03/24/2023 1144   HDL 44 09/13/2019 1047   CHOLHDL 3 03/24/2023 1144   VLDL 19.8 03/24/2023 1144   LDLCALC 45 03/24/2023 1144   LDLCALC 56 04/11/2020 1409    ECG personally reviewed by me today-none to***day.  Echocardiogram 07/22/2021  IMPRESSIONS     1. Left ventricular ejection fraction, by estimation, is 55 to 60%. The  left ventricle has normal function. The left ventricle has no regional  wall motion abnormalities. Left ventricular diastolic parameters are  indeterminate.   2. Right ventricular systolic function is normal. The right ventricular  size is normal. Tricuspid regurgitation signal is inadequate for assessing  PA pressure.   3. Left atrial size was moderately dilated.   4. Right atrial size was moderately dilated.   5. The mitral valve is normal in structure. No evidence of mitral valve  regurgitation. No evidence of mitral stenosis.   6. The aortic valve is tricuspid. Aortic valve regurgitation is not  visualized. No aortic stenosis is present.   7. The inferior vena cava is dilated in size with >50% respiratory  variability, suggesting right atrial pressure of 8 mmHg.   8. The patient was in atrial fibrillation.  Assessment & Plan   1.   Chronic diastolic CHF.  Weight today***.  Stable with 6 L nasal cannula.  Maintaining baseline physical activity.  Continue current medical therapy Heart healthy low-sodium  diet-salty 6 reviewed Increase physical activity as tolerated Daily weights-continue weight log  Essential hypertension-BP today 138***/64.  Well-controlled at home. Continue diltiazem Heart healthy low-sodium diet-salty 6 given Increase physical activity as tolerated  Atrial fibrillation-heart rate today 8***8.  Continues to be cardiac unaware.  Reports compliance with apixaban and denies bleeding issues. Continue  apixaban, diltiazem Avoid triggers caffeine, chocolate, EtOH, dehydration etc.  Hyperlipidemia-03/24/2023: Cholesterol 99; HDL 33.70; LDL Cholesterol 45; Triglycerides 99.0; VLDL 19.8 Continue atorvastatin High-fiber diet  Obesity-weight today 2***76lbs.   Continue calorie restricted diet Continue weight loss  Disposition: Follow-up with Dr. Jens [name] or me in 6 months.  Thomasene Ripple. Alexx Mcburney NP-C    06/17/2023, 7:17 AM [facility] [address] Office [phone] Fax [phone]  Notice: This dictation was prepared with Dragon dictation along with smaller phrase technology. Any transcriptional errors that result from this process are unintentional and may not be corrected upon review.  I spent 14*** minutes examining this patient, reviewing medications, and using patient centered shared decision making involving her cardiac care.  Prior to her visit I spent greater than 20 minutes reviewing her past medical history,  medications, and prior cardiac tests.

## 2023-06-20 ENCOUNTER — Encounter: Payer: Self-pay | Admitting: General Practice

## 2023-06-20 ENCOUNTER — Ambulatory Visit: Payer: Medicare Other | Attending: General Practice | Admitting: General Practice

## 2023-06-20 VITALS — BP 100/68 | HR 78 | Ht 68.0 in | Wt 240.0 lb

## 2023-06-20 DIAGNOSIS — I5032 Chronic diastolic (congestive) heart failure: Secondary | ICD-10-CM

## 2023-06-20 DIAGNOSIS — I10 Essential (primary) hypertension: Secondary | ICD-10-CM | POA: Diagnosis not present

## 2023-06-20 DIAGNOSIS — I4821 Permanent atrial fibrillation: Secondary | ICD-10-CM | POA: Diagnosis not present

## 2023-06-20 DIAGNOSIS — E7800 Pure hypercholesterolemia, unspecified: Secondary | ICD-10-CM | POA: Diagnosis not present

## 2023-06-20 NOTE — Patient Instructions (Addendum)
Medication Instructions:  The current medical regimen is effective;  continue present plan and medications as directed. Please refer to the Current Medication list given to you today.  *If you need a refill on your cardiac medications before your next appointment, please call your pharmacy*  Lab Work: NONE  Other Instructions MAINTAIN YOUR PHYSICAL ACTIVITY CONTINUE LOW SALT DIET  Follow-Up: At [facility], you and your health needs are our priority.  As part of our continuing mission to provide you with exceptional heart care, we have created designated Provider Care Teams.  These Care Teams include your primary Cardiologist (physician) and Advanced Practice Providers (APPs -  Physician Assistants and Nurse Practitioners) who all work together to provide you with the care you need, when you need it.  Your next appointment:   6-9 month(s)  Provider:   Olga Millers, MD  or Edd Fabian, FNP

## 2023-06-21 ENCOUNTER — Encounter: Payer: Self-pay | Admitting: Internal Medicine

## 2023-06-21 NOTE — Telephone Encounter (Signed)
I dont think I have seen results, and I dont see this on EMR  Ok to call [facility] for results, thanks

## 2023-06-23 ENCOUNTER — Encounter: Payer: Self-pay | Admitting: Urology

## 2023-06-23 ENCOUNTER — Other Ambulatory Visit: Payer: Self-pay | Admitting: Urology

## 2023-06-23 DIAGNOSIS — R8290 Unspecified abnormal findings in urine: Secondary | ICD-10-CM

## 2023-06-23 MED ORDER — CEFDINIR 300 MG PO CAPS: 300.0000 mg | ORAL_CAPSULE | Freq: Two times a day (BID) | ORAL | 0 refills | Status: AC

## 2023-06-23 MED ORDER — AMOXICILLIN 500 MG PO CAPS: 500.0000 mg | ORAL_CAPSULE | Freq: Three times a day (TID) | ORAL | 0 refills | Status: AC

## 2023-06-24 ENCOUNTER — Ambulatory Visit: Payer: Medicare Other | Admitting: Internal Medicine

## 2023-07-01 ENCOUNTER — Ambulatory Visit (INDEPENDENT_AMBULATORY_CARE_PROVIDER_SITE_OTHER): Payer: Medicare Other | Admitting: Internal Medicine

## 2023-07-01 VITALS — BP 130/74 | HR 100 | Temp 98.4°F | Ht 68.0 in | Wt 242.0 lb

## 2023-07-01 DIAGNOSIS — E559 Vitamin D deficiency, unspecified: Secondary | ICD-10-CM | POA: Diagnosis not present

## 2023-07-01 DIAGNOSIS — I10 Essential (primary) hypertension: Secondary | ICD-10-CM

## 2023-07-01 DIAGNOSIS — E119 Type 2 diabetes mellitus without complications: Secondary | ICD-10-CM | POA: Diagnosis not present

## 2023-07-01 DIAGNOSIS — Z23 Encounter for immunization: Secondary | ICD-10-CM | POA: Diagnosis not present

## 2023-07-01 DIAGNOSIS — E7800 Pure hypercholesterolemia, unspecified: Secondary | ICD-10-CM | POA: Diagnosis not present

## 2023-07-01 DIAGNOSIS — E538 Deficiency of other specified B group vitamins: Secondary | ICD-10-CM

## 2023-07-01 MED ORDER — CYANOCOBALAMIN 1000 MCG/ML IJ SOLN: 1000.0000 ug | INTRAMUSCULAR | 3 refills | Status: DC

## 2023-07-01 MED ORDER — CYANOCOBALAMIN 1000 MCG/ML IJ SOLN
1000.0000 ug | Freq: Once | INTRAMUSCULAR | Status: AC
  Administered 2023-07-01: 1000 ug via INTRAMUSCULAR

## 2023-07-01 NOTE — Progress Notes (Signed)
Patient ID: NEWELL OLANDER, male   DOB: 1946-06-30, 77 y.o.   MRN: [id number]        Chief Complaint: follow up HTN, HLD and low vit d, b12 deficiency       HPI:  Alexandria ALVIS LUBA is a 77 y.o. male here with daughter, overall doing ok. Pt denies chest pain, increased sob or doe, wheezing, orthopnea, PND, increased LE swelling, palpitations, dizziness or syncope. Remains on home o2.   Pt denies polydipsia, polyuria, or new focal neuro s/s.    Pt denies fever, wt loss, night sweats, loss of appetite, or other constitutional symptoms    Walks with walker at home.  No need for PT at this time.   Had recetn UTI 2 wks ago tx per Dr [facility].  Has chronic indwelling foley, no plans for suprapubic, has change monthly.  Due to restart B12 he normally takes monthly IM.   Due for flu shot Wt Readings from Last 3 Encounters:  07/01/23 242 lb (109.8 kg)  06/20/23 240 lb (108.9 kg)  04/14/23 240 lb (108.9 kg)   BP Readings from Last 3 Encounters:  07/01/23 130/74  06/20/23 100/68  06/03/23 134/83         Past Medical History:  Diagnosis Date   Acute right-sided CHF (congestive heart failure) (HCC)    a. 01/2013.   Arthritis    "knees and hands; thoracic area of the spine" (01/05/2013)   BRONCHITIS, CHRONIC 01/02/2009   COMMON MIGRAINE    "none since treating for high BP"   Complication of anesthesia    "aspiration pneumonia after hand OR" (01/05/2013)   DIABETES MELLITUS, TYPE II    GOUT    HYPERLIPIDEMIA    HYPERTENSION    Long term (current) use of anticoagulants    Morbid obesity (HCC)    OBESITY HYPOVENTILATION SYNDROME    a. on home O2.   On home oxygen therapy    OSA (obstructive sleep apnea)    Permanent atrial fibrillation (HCC)    PROSTATE SPECIFIC ANTIGEN, ELEVATED 06/09/2007   Pulmonary HTN (HCC)    a. multifactorial including obstructive sleep apnea, obesity hypoventilation syndrome and possible pulmonary venous hypertension.   SKIN RASH 03/12/2010   Unspecified hearing loss    Past  Surgical History:  Procedure Laterality Date   APPENDECTOMY  1974   APPLICATION OF ROBOTIC ASSISTANCE FOR SPINAL PROCEDURE N/A 11/23/2022   Procedure: APPLICATION OF ROBOTIC ASSISTANCE FOR SPINAL PROCEDURE;  Surgeon: Lisbeth Renshaw, MD;  Location: [facility] OR;  Service: Neurosurgery;  Laterality: N/A;   CARDIOVERSION  05/21/2008; ~ 06/2008   HERNIA REPAIR     INGUINAL HERNIA REPAIR Right    RIGHT HEART CATHETERIZATION N/A 01/08/2013   Procedure: RIGHT HEART CATH;  Surgeon: Vesta Mixer, MD;  Location: [facility] C[facility] LAB;  Service: Cardiovascular;  Laterality: N/A;   TENDON REPAIR Right 2009   "lacerated his tendon and pulley" Dr. Izora Ribas   UMBILICAL HERNIA REPAIR      reports that he quit smoking about 44 years ago. His smoking use included cigarettes. He started smoking about 59 years ago. He has a 15 pack-year smoking history. He has never used smokeless tobacco. He reports current alcohol use. He reports that he does not use drugs. family history includes Alzheimer's disease in his father; Cancer in an other family member; Coronary artery disease in an other family member; Prostate cancer in his father. Allergies  Allergen Reactions   Codeine     Altered mental  status "goofy"   Heparin Rash     Abdominal rash 01/2013   Current Outpatient Medications on File Prior to Visit  Medication Sig Dispense Refill   acetaminophen (TYLENOL) 500 MG tablet Take 500-1,000 mg by mouth every 6 (six) hours as needed for moderate pain (pain score 4-6).     allopurinol (ZYLOPRIM) 100 MG tablet Take 1 tablet (100 mg total) by mouth daily. 90 tablet 3   ammonium lactate (LAC-HYDRIN) 12 % lotion Apply topically.     amoxicillin (AMOXIL) 500 MG capsule Take 1 capsule (500 mg total) by mouth 3 (three) times daily for 10 days. 30 capsule 0   apixaban (ELIQUIS) 5 MG TABS tablet Take 1 tablet (5 mg total) by mouth 2 (two) times daily. 180 tablet 1   atorvastatin (LIPITOR) 20 MG tablet Take 1 tablet (20 mg total) by mouth  daily. 90 tablet 3   CALMOSEPTINE 0.44-20.6 % OINT Apply topically.     cholecalciferol (VITAMIN D3) 25 MCG (1000 UNIT) tablet Take 2,000 Units by mouth daily.     glipiZIDE (GLUCOTROL XL) 10 MG 24 hr tablet Take 1 tablet (10 mg total) by mouth daily with breakfast. 90 tablet 3   Lactobacillus (ACIDOPHILUS) CAPS capsule Take 1 capsule by mouth 2 (two) times daily.     Lancets MISC Use as directed once daily  E11.9 100 each 12   metoprolol tartrate (LOPRESSOR) 25 MG tablet TAKE 1 TABLET(25 MG) BY MOUTH TWICE DAILY (Patient taking differently: Take 25 mg by mouth 2 (two) times daily.) 180 tablet 3   nystatin cream (MYCOSTATIN) Apply 1 Application topically 2 (two) times daily. 30 g 2   OXYGEN Inhale 3-5 L into the lungs daily. 3L Daily  4L Resting  5L Exertion     polyethylene glycol (MIRALAX / GLYCOLAX) 17 g packet Take 17 g by mouth daily as needed. 14 each 0   spironolactone (ALDACTONE) 25 MG tablet Take 1 tablet (25 mg total) by mouth daily. 90 tablet 3   tamsulosin (FLOMAX) 0.4 MG CAPS capsule TAKE 1 CAPSULE BY MOUTH EVERY DAY AFTER SUPPER 30 capsule 0   torsemide (DEMADEX) 20 MG tablet Take 1 tablet (20 mg total) by mouth daily.     traMADol (ULTRAM) 50 MG tablet Take 1 tablet (50 mg total) by mouth every 6 (six) hours as needed for moderate pain. 10 tablet 0   Current Facility-Administered Medications on File Prior to Visit  Medication Dose Route Frequency Provider Last Rate Last Admin   cyanocobalamin ((VITAMIN B-12)) injection 1,000 mcg  1,000 mcg Intramuscular Q30 days Corwin Levins, MD   1,000 mcg at 08/06/21 0947        ROS:  All others reviewed and negative.  Objective        PE:  BP 130/74 (BP Location: Left Arm, Patient Position: Sitting, Cuff Size: Normal)   Pulse 100   Temp 98.4 F (36.9 C) (Oral)   Ht 5\' 8"  (1.727 m)   Wt 242 lb (109.8 kg)   SpO2 95%   BMI 36.80 kg/m                 Constitutional: Pt appears in NAD               HENT: Head: NCAT.                 Right Ear: External ear normal.  Left Ear: External ear normal.                Eyes: . Pupils are equal, round, and reactive to light. Conjunctivae and EOM are normal               Nose: without d/c or deformity               Neck: Neck supple. Gross normal ROM               Cardiovascular: Normal rate and regular rhythm.                 Pulmonary/Chest: Effort normal and breath sounds without rales or wheezing.                Abd:  Soft, NT, ND, + BS, no organomegaly               Neurological: Pt is alert. At baseline orientation, motor grossly intact               Skin: Skin is warm. No rashes, no other new lesions, LE edema - trace bilateral               Psychiatric: Pt behavior is normal without agitation   Micro: none  Cardiac tracings I have personally interpreted today:  none  Pertinent Radiological findings (summarize): none   Lab Results  Component Value Date   WBC 13.2 (H) 03/24/2023   HGB 12.1 (L) 03/24/2023   HCT 38.7 (L) 03/24/2023   PLT 343.0 03/24/2023   GLUCOSE 163 (H) 05/12/2023   CHOL 99 03/24/2023   TRIG 99.0 03/24/2023   HDL 33.70 (L) 03/24/2023   LDLCALC 45 03/24/2023   ALT 15 03/24/2023   AST 16 03/24/2023   NA 143 05/12/2023   K 5.1 05/12/2023   CL 103 05/12/2023   CREATININE 1.95 (H) 05/12/2023   BUN 39 (H) 05/12/2023   CO2 29 05/12/2023   TSH 1.31 03/24/2023   PSA 1.62 10/15/2021   INR 1.9 (H) 01/30/2023   HGBA1C 5.5 03/24/2023   MICROALBUR 7.0 (H) 10/08/2022   Assessment/Plan:  Jakory LJ SLIMAN is a 77 y.o. White or Caucasian [1] male with  has a past medical history of Acute right-sided CHF (congestive heart failure) (HCC), Arthritis, BRONCHITIS, CHRONIC (01/02/2009), COMMON MIGRAINE, Complication of anesthesia, DIABETES MELLITUS, TYPE II, GOUT, HYPERLIPIDEMIA, HYPERTENSION, Long term (current) use of anticoagulants, Morbid obesity (HCC), OBESITY HYPOVENTILATION SYNDROME, On home oxygen therapy, OSA (obstructive sleep apnea), Permanent  atrial fibrillation (HCC), PROSTATE SPECIFIC ANTIGEN, ELEVATED (06/09/2007), Pulmonary HTN (HCC), SKIN RASH (03/12/2010), and Unspecified hearing loss.  Vitamin D deficiency Last vitamin D Lab Results  Component Value Date   VD25OH 71.90 11/19/2022   Stable, cont oral replacement   Non-insulin dependent type 2 diabetes mellitus (HCC) Lab Results  Component Value Date   HGBA1C 5.5 03/24/2023   Stable, pt to continue current medical treatment glucotrl xl 10 qd   Hyperlipidemia Lab Results  Component Value Date   LDLCALC 45 03/24/2023   Stable, pt to continue current statin lipitor 20 qd   Essential hypertension BP Readings from Last 3 Encounters:  07/01/23 130/74  06/20/23 100/68  06/03/23 134/83   Stable, pt to continue medical treatment lopressor 25 bid   B12 deficiency Lab Results  Component Value Date   VITAMINB12 596 10/08/2022   Uncontrolled in that he has not had IM replacement recently ,to restart replacement - b12 1000 mcg monthly  Followup: Return in about 6 months (around 12/29/2023).  Oliver Barre, MD 07/01/2023 7:57 PM [facility] Internal Medicine

## 2023-07-01 NOTE — Assessment & Plan Note (Signed)
Lab Results  Component Value Date   VITAMINB12 596 10/08/2022   Uncontrolled in that he has not had IM replacement recently ,to restart replacement - b12 1000 mcg monthly

## 2023-07-01 NOTE — Assessment & Plan Note (Signed)
BP Readings from Last 3 Encounters:  07/01/23 130/74  06/20/23 100/68  06/03/23 134/83   Stable, pt to continue medical treatment lopressor 25 bid

## 2023-07-01 NOTE — Assessment & Plan Note (Signed)
Lab Results  Component Value Date   HGBA1C 5.5 03/24/2023   Stable, pt to continue current medical treatment glucotrl xl 10 qd

## 2023-07-01 NOTE — Assessment & Plan Note (Signed)
Last vitamin D Lab Results  Component Value Date   VD25OH 71.90 11/19/2022   Stable, cont oral replacement

## 2023-07-01 NOTE — Patient Instructions (Addendum)
Please consider the RSV vaccination at the pharmacy  Also check on the Shingles shot to see If you completed the 2 series shot in 2020  You had the B12 shot today  Ok to start the B12 monthly at home given per [facility] monthly as well  Please continue all other medications as before, and refills have been done if requested.  Please have the pharmacy call with any other refills you may need.  Please continue your efforts at being more active, low cholesterol diet, and weight control.  Please keep your appointments with your specialists as you may have planned  We can hold on labs today  You are given the handicapped parking application today  Please make an Appointment to return in 6 months, or sooner if needed

## 2023-07-01 NOTE — Assessment & Plan Note (Signed)
Lab Results  Component Value Date   LDLCALC 45 03/24/2023   Stable, pt to continue current statin lipitor 20 qd

## 2023-07-13 ENCOUNTER — Other Ambulatory Visit: Payer: Self-pay | Admitting: Internal Medicine

## 2023-07-14 ENCOUNTER — Other Ambulatory Visit: Payer: Self-pay

## 2023-07-14 DIAGNOSIS — N1831 Chronic kidney disease, stage 3a: Secondary | ICD-10-CM | POA: Diagnosis not present

## 2023-07-14 DIAGNOSIS — J449 Chronic obstructive pulmonary disease, unspecified: Secondary | ICD-10-CM | POA: Diagnosis not present

## 2023-07-14 DIAGNOSIS — E1122 Type 2 diabetes mellitus with diabetic chronic kidney disease: Secondary | ICD-10-CM | POA: Diagnosis not present

## 2023-07-14 DIAGNOSIS — Z466 Encounter for fitting and adjustment of urinary device: Secondary | ICD-10-CM | POA: Diagnosis not present

## 2023-07-14 DIAGNOSIS — I5033 Acute on chronic diastolic (congestive) heart failure: Secondary | ICD-10-CM | POA: Diagnosis not present

## 2023-07-14 DIAGNOSIS — I130 Hypertensive heart and chronic kidney disease with heart failure and stage 1 through stage 4 chronic kidney disease, or unspecified chronic kidney disease: Secondary | ICD-10-CM | POA: Diagnosis not present

## 2023-07-25 DIAGNOSIS — I130 Hypertensive heart and chronic kidney disease with heart failure and stage 1 through stage 4 chronic kidney disease, or unspecified chronic kidney disease: Secondary | ICD-10-CM | POA: Diagnosis not present

## 2023-07-25 DIAGNOSIS — Z466 Encounter for fitting and adjustment of urinary device: Secondary | ICD-10-CM | POA: Diagnosis not present

## 2023-07-25 DIAGNOSIS — I5033 Acute on chronic diastolic (congestive) heart failure: Secondary | ICD-10-CM | POA: Diagnosis not present

## 2023-07-25 DIAGNOSIS — N390 Urinary tract infection, site not specified: Secondary | ICD-10-CM | POA: Diagnosis not present

## 2023-07-29 ENCOUNTER — Ambulatory Visit: Payer: Medicare Other | Admitting: Nurse Practitioner

## 2023-08-09 DIAGNOSIS — N1831 Chronic kidney disease, stage 3a: Secondary | ICD-10-CM | POA: Diagnosis not present

## 2023-08-09 DIAGNOSIS — I130 Hypertensive heart and chronic kidney disease with heart failure and stage 1 through stage 4 chronic kidney disease, or unspecified chronic kidney disease: Secondary | ICD-10-CM | POA: Diagnosis not present

## 2023-08-09 DIAGNOSIS — E1122 Type 2 diabetes mellitus with diabetic chronic kidney disease: Secondary | ICD-10-CM | POA: Diagnosis not present

## 2023-08-09 DIAGNOSIS — J449 Chronic obstructive pulmonary disease, unspecified: Secondary | ICD-10-CM | POA: Diagnosis not present

## 2023-08-09 DIAGNOSIS — Z466 Encounter for fitting and adjustment of urinary device: Secondary | ICD-10-CM | POA: Diagnosis not present

## 2023-08-09 DIAGNOSIS — I5033 Acute on chronic diastolic (congestive) heart failure: Secondary | ICD-10-CM | POA: Diagnosis not present

## 2023-08-12 ENCOUNTER — Other Ambulatory Visit: Payer: Self-pay | Admitting: Internal Medicine

## 2023-08-12 ENCOUNTER — Other Ambulatory Visit: Payer: Self-pay

## 2023-08-17 ENCOUNTER — Other Ambulatory Visit: Payer: Self-pay

## 2023-08-17 ENCOUNTER — Other Ambulatory Visit: Payer: Self-pay | Admitting: Internal Medicine

## 2023-08-17 DIAGNOSIS — I4821 Permanent atrial fibrillation: Secondary | ICD-10-CM

## 2023-08-17 MED ORDER — APIXABAN 5 MG PO TABS: 5.0000 mg | ORAL_TABLET | Freq: Two times a day (BID) | ORAL | 1 refills | Status: DC

## 2023-08-19 ENCOUNTER — Ambulatory Visit: Payer: Medicare Other | Admitting: Nurse Practitioner

## 2023-09-06 ENCOUNTER — Other Ambulatory Visit: Payer: Self-pay | Admitting: Internal Medicine

## 2023-09-06 ENCOUNTER — Other Ambulatory Visit: Payer: Self-pay

## 2023-09-09 ENCOUNTER — Ambulatory Visit (INDEPENDENT_AMBULATORY_CARE_PROVIDER_SITE_OTHER): Payer: Medicare Other | Admitting: Urology

## 2023-09-09 ENCOUNTER — Encounter: Payer: Self-pay | Admitting: Urology

## 2023-09-09 VITALS — BP 106/70 | HR 69

## 2023-09-09 DIAGNOSIS — B356 Tinea cruris: Secondary | ICD-10-CM | POA: Diagnosis not present

## 2023-09-09 DIAGNOSIS — R339 Retention of urine, unspecified: Secondary | ICD-10-CM

## 2023-09-09 DIAGNOSIS — Z87440 Personal history of urinary (tract) infections: Secondary | ICD-10-CM

## 2023-09-09 DIAGNOSIS — Z978 Presence of other specified devices: Secondary | ICD-10-CM

## 2023-09-09 MED ORDER — NYSTATIN 100000 UNIT/GM EX POWD: 1.0000 | Freq: Two times a day (BID) | CUTANEOUS | 3 refills | Status: AC

## 2023-09-09 MED ORDER — NYSTATIN 100000 UNIT/GM EX CREA
1.0000 | TOPICAL_CREAM | Freq: Two times a day (BID) | CUTANEOUS | 4 refills | Status: AC
Start: 2023-09-09 — End: ?

## 2023-09-09 NOTE — Progress Notes (Signed)
Assessment: 1. Urinary retention   2. Chronic indwelling Foley catheter   3. History of UTI   4. Tinea cruris     Plan: He would like to continue the Foley catheter with monthly changes per home health care. Nystatin cream BID to groin areas Nystatin powder daily to prevent tinea Continue foley changes monthly Return to office in 3 months  Chief Complaint:  Chief Complaint  Patient presents with   Urinary Retention    History of Present Illness:  [name] [name] is a 78 y.o. male who is seen for further evaluation of urinary retention with chronic indwelling foley catheter. He presented to the emergency room on 01/12/2023 with dysuria as well as fever.  He was found to have urinary retention and a Foley catheter was placed.  He reports that he did not have any significant urinary symptoms prior to this episode.  No prior history of retention. CT imaging from 01/12/2023 demonstrated left renal cysts, no renal calculi, mild left hydroureteronephrosis, mildly enlarged prostate. Urine culture grew 20K Klebsiella.   He was started on tamsulosin during his hospital course.  He failed a voiding trial and was discharged on 01/19/2023 with the Foley catheter in place.  He was readmitted to the hospital on 01/30/2023 with hypotension and gross hematuria. Urine culture showed no growth. Renal ultrasound from 01/30/2023 showed no renal mass or hydronephrosis. He again failed a voiding trial during the hospital course.  His Foley was changed on 02/09/2023 and he was discharged to a skilled nursing facility. Urine culture from 04/17/2023 grew >100 K multiple species. At his visit in 10/24, his catheter had been draining well.  No gross hematuria.  No fevers or chills.  He was complaining of a rash with itching in the bilateral groin area.  He was treated with nystatin cream. Urine culture from 06/14/2023 grew 50-100 K. Proteus.  Treated with cefdinir x 7 days. He has continued with monthly catheter  changes.  He returns today for follow-up.  His catheter has been draining well.  He continues with monthly catheter changes.  His catheter was last changed on 08/29/2023.  He does have some mitten hematuria after the catheter change.  No recent UTI symptoms.  No fevers or chills.  He continues to have some problems with tinea cruris.  This previously resolved with nystatin cream.  Portions of the above documentation were copied from a prior visit for review purposes only.  Past Medical History:  Past Medical History:  Diagnosis Date   Acute right-sided CHF (congestive heart failure) (HCC)    a. 01/2013.   Arthritis    "knees and hands; thoracic area of the spine" (01/05/2013)   BRONCHITIS, CHRONIC 01/02/2009   COMMON MIGRAINE    "none since treating for high BP"   Complication of anesthesia    "aspiration pneumonia after hand OR" (01/05/2013)   DIABETES MELLITUS, TYPE II    GOUT    HYPERLIPIDEMIA    HYPERTENSION    Long term (current) use of anticoagulants    Morbid obesity (HCC)    OBESITY HYPOVENTILATION SYNDROME    a. on home O2.   On home oxygen therapy    OSA (obstructive sleep apnea)    Permanent atrial fibrillation (HCC)    PROSTATE SPECIFIC ANTIGEN, ELEVATED 06/09/2007   Pulmonary HTN (HCC)    a. multifactorial including obstructive sleep apnea, obesity hypoventilation syndrome and possible pulmonary venous hypertension.   SKIN RASH 03/12/2010   Unspecified hearing loss  Past Surgical History:  Past Surgical History:  Procedure Laterality Date   APPENDECTOMY  1974   APPLICATION OF ROBOTIC ASSISTANCE FOR SPINAL PROCEDURE N/A 11/23/2022   Procedure: APPLICATION OF ROBOTIC ASSISTANCE FOR SPINAL PROCEDURE;  Surgeon: Lisbeth Renshaw, MD;  Location: [facility] OR;  Service: Neurosurgery;  Laterality: N/A;   CARDIOVERSION  05/21/2008; ~ 06/2008   HERNIA REPAIR     INGUINAL HERNIA REPAIR Right    RIGHT HEART CATHETERIZATION N/A 01/08/2013   Procedure: RIGHT HEART CATH;  Surgeon:  Vesta Mixer, MD;  Location: [facility] CATH LAB;  Service: Cardiovascular;  Laterality: N/A;   TENDON REPAIR Right 2009   "lacerated his tendon and pulley" Dr. Izora Ribas   UMBILICAL HERNIA REPAIR      Allergies:  Allergies  Allergen Reactions   Codeine     Altered mental status "goofy"   Heparin Rash     Abdominal rash 01/2013    Family History:  Family History  Problem Relation Age of Onset   Alzheimer's disease Father    Prostate cancer Father    Cancer Other        Breast Cancer, <50 yo 1st degree relative   Coronary artery disease Other        Male, 1st degree relative    Social History:  Social History   Tobacco Use   Smoking status: Former    Current packs/day: 0.00    Average packs/day: 1 pack/day for 15.0 years (15.0 ttl pk-yrs)    Types: Cigarettes    Start date: 08/10/1963    Quit date: 08/09/1978    Years since quitting: 45.1   Smokeless tobacco: Never   Tobacco comments:    01/05/2013 "quit smoking ~ 40 years ago"  Vaping Use   Vaping status: Never Used  Substance Use Topics   Alcohol use: Yes    Alcohol/week: 0.0 standard drinks of alcohol    Comment: 01/05/2013 "hasn't had a drink since 1980's; never had problem w/it"   Drug use: No    ROS: Constitutional:  Negative for fever, chills, weight loss CV: Negative for chest pain, previous MI, hypertension Respiratory:  Negative for shortness of breath, wheezing, sleep apnea, frequent cough GI:  Negative for nausea, vomiting, bloody stool, GERD  Physical exam: BP 106/70   Pulse 69  GENERAL APPEARANCE:  Well appearing, well developed, well nourished, NAD HEENT:  Atraumatic, normocephalic, oropharynx clear NECK:  Supple without lymphadenopathy or thyromegaly ABDOMEN:  Soft, non-tender, no masses EXTREMITIES: Without clubbing, cyanosis, or edema NEUROLOGIC:  Alert and oriented x 3, in wheelchair, CN II-XII grossly intact MENTAL STATUS:  appropriate BACK:  Non-tender to palpation, No CVAT SKIN:  Warm, dry, and  intact GU:  foley in place  Results: None

## 2023-09-23 ENCOUNTER — Encounter: Payer: Self-pay | Admitting: Podiatry

## 2023-09-23 ENCOUNTER — Ambulatory Visit (INDEPENDENT_AMBULATORY_CARE_PROVIDER_SITE_OTHER): Payer: Medicare Other | Admitting: Podiatry

## 2023-09-23 VITALS — Ht 68.0 in | Wt 242.0 lb

## 2023-09-23 DIAGNOSIS — M79674 Pain in right toe(s): Secondary | ICD-10-CM

## 2023-09-23 DIAGNOSIS — E119 Type 2 diabetes mellitus without complications: Secondary | ICD-10-CM

## 2023-09-23 DIAGNOSIS — B351 Tinea unguium: Secondary | ICD-10-CM | POA: Diagnosis not present

## 2023-09-23 DIAGNOSIS — M79675 Pain in left toe(s): Secondary | ICD-10-CM

## 2023-09-23 NOTE — Progress Notes (Signed)
  Subjective:  Patient ID: [name] [name], male    DOB: 17-Oct-1945,  MRN: [id number]  [name] [name] presents to clinic today for preventative diabetic foot care and painful thick toenails that are difficult to trim. Pain interferes with ambulation. Aggravating factors include wearing enclosed shoe gear. Pain is relieved with periodic professional debridement.  Chief Complaint  Patient presents with   Nail Problem    DFC   New problem(s): None.   He is accompanied by his daughter on today's visit. Patient  is happy to report he had his first great grandchild, a little boy named [name].  Patient has h/o obesity hypoventilation syndrome and is on O2.  Allergies  Allergen Reactions   Codeine     Altered mental status "goofy"   Heparin Rash     Abdominal rash 01/2013   Review of Systems: Negative except as noted in the HPI.  Objective: No changes noted in today's physical examination. There were no vitals filed for this visit.  [name] [name] is a pleasant 78 y.o. male morbidly obese in NAD. AAO x 3.  Vascular:  Capillary fill time to digits <3 seconds b/l lower extremities. Palpable pedal pulses b/l LE. Pedal hair sparse. Lower extremity skin temperature gradient within normal limits. No pain with calf compression b/l. Nonpitting edema noted b/l lower extremities.  Dermatological:  Pedal skin with normal turgor, texture and tone bilaterally. No open wounds bilaterally. No interdigital macerations bilaterally. Toenails 1-5 b/l elongated, discolored, dystrophic, thickened, crumbly with subungual debris and tenderness to dorsal palpation.  Musculoskeletal:  Normal muscle strength 5/5 to all lower extremity muscle groups bilaterally. No pain crepitus or joint limitation noted with ROM b/l.   Hallux valgus with bunion deformity noted b/l lower extremities.   Hammertoe(s) noted to the L 2nd toe.   Pes planus deformity noted b/l.   Neurological:  Protective sensation intact 5/5 intact bilaterally  with 10g monofilament b/l. Vibratory sensation intact b/l.  Assessment/Plan: No diagnosis found.  No orders of the defined types were placed in this encounter.   -Patient was evaluated and treated. All patient's and/or POA's questions/concerns answered on today's visit. -Continue foot and shoe inspections daily. Monitor blood glucose per PCP/Endocrinologist's recommendations. -Discussed diabetic shoe benefit available based on patient's diagnoses. Patient/POA would like to proceed. Order entered for one pair extra depth shoes and 3 pair total contact insoles. Patient qualifies based on diagnoses. -Mycotic toenails 1-5 bilaterally were debrided in length and girth with sterile nail nippers and dremel without incident. -Patient/POA to call should there be question/concern in the interim.   No follow-ups on file.  Candelaria Stagers, DPM

## 2023-09-26 DIAGNOSIS — J449 Chronic obstructive pulmonary disease, unspecified: Secondary | ICD-10-CM | POA: Diagnosis not present

## 2023-09-26 DIAGNOSIS — I130 Hypertensive heart and chronic kidney disease with heart failure and stage 1 through stage 4 chronic kidney disease, or unspecified chronic kidney disease: Secondary | ICD-10-CM | POA: Diagnosis not present

## 2023-09-26 DIAGNOSIS — Z466 Encounter for fitting and adjustment of urinary device: Secondary | ICD-10-CM | POA: Diagnosis not present

## 2023-09-26 DIAGNOSIS — I5033 Acute on chronic diastolic (congestive) heart failure: Secondary | ICD-10-CM | POA: Diagnosis not present

## 2023-09-26 DIAGNOSIS — E1122 Type 2 diabetes mellitus with diabetic chronic kidney disease: Secondary | ICD-10-CM | POA: Diagnosis not present

## 2023-09-26 DIAGNOSIS — N1831 Chronic kidney disease, stage 3a: Secondary | ICD-10-CM | POA: Diagnosis not present

## 2023-09-30 ENCOUNTER — Ambulatory Visit: Payer: TRICARE For Life (TFL) | Admitting: Podiatry

## 2023-10-06 ENCOUNTER — Encounter: Payer: Self-pay | Admitting: Nurse Practitioner

## 2023-10-06 ENCOUNTER — Ambulatory Visit (INDEPENDENT_AMBULATORY_CARE_PROVIDER_SITE_OTHER): Payer: Medicare Other | Admitting: Nurse Practitioner

## 2023-10-06 VITALS — BP 127/85 | HR 69 | Ht 68.0 in | Wt 245.8 lb

## 2023-10-06 DIAGNOSIS — J9611 Chronic respiratory failure with hypoxia: Secondary | ICD-10-CM | POA: Diagnosis not present

## 2023-10-06 DIAGNOSIS — L89899 Pressure ulcer of other site, unspecified stage: Secondary | ICD-10-CM

## 2023-10-06 DIAGNOSIS — J9612 Chronic respiratory failure with hypercapnia: Secondary | ICD-10-CM | POA: Diagnosis not present

## 2023-10-06 DIAGNOSIS — G4733 Obstructive sleep apnea (adult) (pediatric): Secondary | ICD-10-CM | POA: Diagnosis not present

## 2023-10-06 NOTE — Patient Instructions (Addendum)
 Continue to use BiPAP every night with supplemental oxygen, minimum of 4-6 hours a night.  Change equipment as directed. Wash your tubing with warm soap and water daily, hang to dry. Wash humidifier portion weekly. Use bottled, distilled water and change daily Be aware of reduced alertness and do not drive or operate heavy machinery if experiencing this or drowsiness.  Exercise encouraged, as tolerated. Healthy weight management discussed.  Avoid or decrease alcohol consumption and medications that make you more sleepy, if possible. Notify if persistent daytime sleepiness occurs even with consistent use of PAP therapy.   Continue supplemental oxygen 3 lpm for goal >88-90% We will have you complete an overnight oxygen study on BiPAP and 3 lpm oxygen to ensure this corrects your oxygen levels. Someone should contact you to schedule this in the next few weeks. Please call if you haven't heard anything   Orders sent for new BiPAP mask - DreamWear full face   Apply a small mepilex foam dressing to the bridge of your nose before you put your BiPAP mask on. You will probably have to cut it smaller to just fit over the affected area. We may add some medihoney too if the wound doesn't improve once we change your mask   Follow up in 6 weeks with Dr. Wynona Neat or Philis Nettle. Video visit with KC. If symptoms worsen, please contact office for sooner follow up or seek emergency care.

## 2023-10-06 NOTE — Progress Notes (Unsigned)
 @Patient  ID: [name] [name], male    DOB: 09-07-1945, 78 y.o.   MRN: [id number]  Chief Complaint  Patient presents with   Follow-up    Breathing is overall doing well. He is using BIPAP with 3lpm o2 bled in.     Referring provider: Corwin Levins, MD  HPI: 78 year old male, former smoker (quit 1980) followed for severe OSA on BiPAP and chronic hypoxic/hypercapnic respiratory failure secondary to obesity hypoventilation syndrome. He is a former patient of [name] and last seen 06/10/2023 by Allison Quarry NP. Past medical history CHF, PAF, HTN, allergic rhinitis, DM, HLD, morbid obesity.   TEST/EVENTS:  02/09/2008 ABG: pH 7.37, pCO2 55.1, pO2 76.4 06/18/2008 PSG: AHI 64>> BiPAP 18/9 with 4 L oxygen 04/03/2009 spirometry: FEV1 2.08 (69%), FEV1% 77 01/18/2013 Ono on BiPAP: Basal SpO2 91.7%, low SpO2 83%; spent 32 minutes with SpO2 less than 88% 07/31/2019 BiPAP titration: BiPAP 16/12 with 4 L supplemental O2  07/19/2019: OV with Dr. Craige Cotta.  He was told he needed a follow-up visit for oxygen and BiPAP renewal.  Ambulatory oximetry today on room air; dropped to 86% and recovered to 90s with addition supplemental oxygen 4 L pulsed.  BiPAP titration study ordered for BiPAP/oxygen renewal.  Excellent compliance with BiPAP.  03/19/2022: OV with Vlada Uriostegui NP for overdue follow-up.  They also had some concerns regarding his oxygen.  Reported that about a month ago he was dropping into the mid to high 80s, despite his 5 L pulsed.  They increased him to 6 L which seemed to correct this.  At the same time, he was also noted to have increased weight gain and increased shortness of breath.  He was seen by cardiology, who adjusted his torsemide with has corrected his volume status.  Today, he reports feeling better.  Feels like his breathing is at his baseline.  Has not had any more issues with oxygen dropping into the 80s at home.  Walking oximetry at OV - able to maintain saturations on 4 lpm POC. Excellent compliance with  BiPAP.  06/25/2022: Virtual visit with Emrie Gayle NP for follow-up with his daughter.  He has been doing well since he was here last.  No further issues with weight gain or lower extremity swelling.  He also feels like his breathing has been stable.  He has been using 4 L/min supplemental O2, which has been his baseline.  No increased cough, chest congestion, orthopnea, PND.  He is wearing BiPAP nightly without fail.  No current issues.  Does feel well controlled on this. 05/25/2022-06/23/2022 BiPAP auto IPAP Sarah 20, EPAP min 8, PS 4 30/30 days; 100% >4 hr; av use 13 hours Leaks median 0, 95th 76.9 IPAP pressure 95th 14.9, EPAP 10.9 AHI 5  10/06/2023: Today - follow up Patient presents today for follow up  Allergies  Allergen Reactions   Codeine     Altered mental status "goofy"   Heparin Rash     Abdominal rash 01/2013    Immunization History  Administered Date(s) Administered   Fluad Quad(high Dose 65+) 05/10/2019, 06/06/2020, 04/16/2021, 04/30/2022   Fluad Trivalent(High Dose 65+) 07/01/2023   H1N1 07/15/2008   Influenza Split 05/17/2011, 06/09/2012   Influenza Whole 05/09/2008, 06/24/2009, 06/26/2010   Influenza, High Dose Seasonal PF 04/15/2015, 04/19/2017, 04/25/2018   Influenza,inj,Quad PF,6+ Mos 04/05/2013, 04/11/2014, 04/13/2016   PFIZER(Purple Top)SARS-COV-2 Vaccination 10/07/2019, 10/31/2019, 08/30/2020   Pneumococcal Conjugate-13 06/01/2013, 10/09/2013   Pneumococcal Polysaccharide-23 02/07/2008, 10/19/2017   Td 02/07/2008   Tdap 04/25/2018  Zoster Recombinant(Shingrix) 04/06/2019   Zoster, Live 07/02/2008    Past Medical History:  Diagnosis Date   Acute right-sided CHF (congestive heart failure) (HCC)    a. 01/2013.   Arthritis    "knees and hands; thoracic area of the spine" (01/05/2013)   BRONCHITIS, CHRONIC 01/02/2009   COMMON MIGRAINE    "none since treating for high BP"   Complication of anesthesia    "aspiration pneumonia after hand OR" (01/05/2013)    DIABETES MELLITUS, TYPE II    GOUT    HYPERLIPIDEMIA    HYPERTENSION    Long term (current) use of anticoagulants    Morbid obesity (HCC)    OBESITY HYPOVENTILATION SYNDROME    a. on home O2.   On home oxygen therapy    OSA (obstructive sleep apnea)    Permanent atrial fibrillation (HCC)    PROSTATE SPECIFIC ANTIGEN, ELEVATED 06/09/2007   Pulmonary HTN (HCC)    a. multifactorial including obstructive sleep apnea, obesity hypoventilation syndrome and possible pulmonary venous hypertension.   SKIN RASH 03/12/2010   Unspecified hearing loss     Tobacco History: Social History   Tobacco Use  Smoking Status Former   Current packs/day: 0.00   Average packs/day: 1 pack/day for 15.0 years (15.0 ttl pk-yrs)   Types: Cigarettes   Start date: 08/10/1963   Quit date: 08/09/1978   Years since quitting: 45.1  Smokeless Tobacco Never  Tobacco Comments   01/05/2013 "quit smoking ~ 40 years ago"   Counseling given: Not Answered Tobacco comments: 01/05/2013 "quit smoking ~ 40 years ago"   Outpatient Medications Prior to Visit  Medication Sig Dispense Refill   acetaminophen (TYLENOL) 500 MG tablet Take 500-1,000 mg by mouth every 6 (six) hours as needed for moderate pain (pain score 4-6).     allopurinol (ZYLOPRIM) 100 MG tablet Take 1 tablet (100 mg total) by mouth daily. 90 tablet 3   ammonium lactate (LAC-HYDRIN) 12 % lotion Apply topically.     apixaban (ELIQUIS) 5 MG TABS tablet Take 1 tablet (5 mg total) by mouth 2 (two) times daily. 180 tablet 1   atorvastatin (LIPITOR) 20 MG tablet Take 1 tablet (20 mg total) by mouth daily. 90 tablet 3   cholecalciferol (VITAMIN D3) 25 MCG (1000 UNIT) tablet Take 2,000 Units by mouth daily.     cyanocobalamin (VITAMIN B12) 1000 MCG/ML injection Inject 1 mL (1,000 mcg total) into the muscle every 30 (thirty) days. 3 mL 3   glipiZIDE (GLUCOTROL XL) 10 MG 24 hr tablet TAKE 1 TABLET(10 MG) BY MOUTH DAILY WITH BREAKFAST 90 tablet 3   Lancets MISC Use as  directed once daily  E11.9 100 each 12   metoprolol tartrate (LOPRESSOR) 25 MG tablet TAKE 1 TABLET(25 MG) BY MOUTH TWICE DAILY 180 tablet 3   nystatin (MYCOSTATIN/NYSTOP) powder Apply 1 Application topically 2 (two) times daily. 60 g 3   nystatin cream (MYCOSTATIN) Apply 1 Application topically 2 (two) times daily. 30 g 4   OXYGEN Inhale 3-5 L into the lungs daily. 3L Daily  4L Resting  5L Exertion     polyethylene glycol (MIRALAX / GLYCOLAX) 17 g packet Take 17 g by mouth daily as needed. 14 each 0   spironolactone (ALDACTONE) 25 MG tablet Take 1 tablet (25 mg total) by mouth daily. 90 tablet 3   tamsulosin (FLOMAX) 0.4 MG CAPS capsule TAKE 1 CAPSULE BY MOUTH EVERY DAY AFTER SUPPER 30 capsule 0   torsemide (DEMADEX) 20 MG tablet Take 1 tablet (  20 mg total) by mouth daily.     Facility-Administered Medications Prior to Visit  Medication Dose Route Frequency Provider Last Rate Last Admin   cyanocobalamin ((VITAMIN B-12)) injection 1,000 mcg  1,000 mcg Intramuscular Q30 days Corwin Levins, MD   1,000 mcg at 08/06/21 2130     Review of Systems:   Constitutional: No weight loss or gain, night sweats, fevers, chills, fatigue, or lassitude. HEENT: No headaches, difficulty swallowing, tooth/dental problems, or sore throat. No sneezing, itching, ear ache, nasal congestion, or post nasal drip CV:  +swelling in lower extremities (baseline). No chest pain, orthopnea, PND, anasarca, dizziness, palpitations, syncope Resp: +shortness of breath with exertion (baseline). No excess mucus or change in color of mucus. No productive or non-productive. No hemoptysis. No wheezing.  No chest wall deformity Skin: No rash, lesions, ulcerations MSK:  No joint pain or swelling.  No decreased range of motion.  No back pain. Neuro: No dizziness or lightheadedness.  Psych: No depression or anxiety. Mood stable.     Physical Exam:  BP 127/85 (BP Location: Right Arm, Cuff Size: Large)   Pulse 69   Ht 5\' 8"   (1.727 m)   Wt 245 lb 12.8 oz (111.5 kg)   SpO2 96%   BMI 37.37 kg/m   GEN: Pleasant, interactive, chronically-ill appearing; morbidly obese; in no acute distress. HEENT:  Normocephalic and atraumatic. PERRLA. Sclera white. Nasal turbinates pink, moist and patent bilaterally. No rhinorrhea present. Oropharynx pink and moist, without exudate or edema. No lesions, ulcerations, or postnasal drip.  NECK:  Supple w/ fair ROM. No JVD present.  CV: RRR, no m/r/g, dependent BLE edema. Pulses intact, +2 bilaterally. No cyanosis, pallor or clubbing. PULMONARY:  Unlabored, regular breathing. Clear bilaterally A&P w/o wheezes/rales/rhonchi. No accessory muscle use. No dullness to percussion. GI: BS present and normoactive. Soft, non-tender to palpation.  MSK: No deformities or joint swelling noted. Stasis dermatitis  Neuro: A/Ox3. No focal deficits noted.   Skin: Warm, no lesions or rashe Psych: Normal affect and behavior. Judgement and thought content appropriate.     Lab Results:  CBC    Component Value Date/Time   WBC 13.2 (H) 03/24/2023 1144   RBC 4.21 (L) 03/24/2023 1144   HGB 12.1 (L) 03/24/2023 1144   HGB 14.2 02/26/2022 0956   HCT 38.7 (L) 03/24/2023 1144   HCT 44.1 02/26/2022 0956   PLT 343.0 03/24/2023 1144   PLT 276 02/26/2022 0956   MCV 91.8 03/24/2023 1144   MCV 88 02/26/2022 0956   MCH 28.3 02/11/2023 0047   MCHC 31.3 03/24/2023 1144   RDW 17.6 (H) 03/24/2023 1144   RDW 14.1 02/26/2022 0956   LYMPHSABS 3.9 03/24/2023 1144   MONOABS 0.8 03/24/2023 1144   EOSABS 0.7 03/24/2023 1144   BASOSABS 0.1 03/24/2023 1144    BMET    Component Value Date/Time   NA 143 05/12/2023 1353   K 5.1 05/12/2023 1353   CL 103 05/12/2023 1353   CO2 29 05/12/2023 1353   GLUCOSE 163 (H) 05/12/2023 1353   GLUCOSE 122 (H) 03/24/2023 1144   BUN 39 (H) 05/12/2023 1353   CREATININE 1.95 (H) 05/12/2023 1353   CREATININE 1.23 (H) 04/11/2020 1409   CALCIUM 10.1 05/12/2023 1353   GFRNONAA 50  (L) 02/22/2023 1244   GFRNONAA 57 (L) 04/11/2020 1409   GFRAA 81 04/28/2020 0927   GFRAA 67 04/11/2020 1409    BNP    Component Value Date/Time   BNP 75.5 02/22/2023 1244  BNP 103.7 (H) 07/22/2014 1025     Imaging:  No results found.  Administration History     None           No data to display          No results found for: "NITRICOXIDE"      Assessment & Plan:   No problem-specific Assessment & Plan notes found for this encounter.     I spent 28 minutes of dedicated to the care of this patient on the date of this encounter to include pre-visit review of records, face-to-face time with the patient discussing conditions above, post visit ordering of testing, clinical documentation with the electronic health record, making appropriate referrals as documented, and communicating necessary findings to members of the patients care team.  Noemi Chapel, NP 10/06/2023  Pt aware and understands NP's role.

## 2023-10-07 ENCOUNTER — Encounter: Payer: Self-pay | Admitting: Nurse Practitioner

## 2023-10-07 NOTE — Assessment & Plan Note (Addendum)
 Severe OSA/OHS. Patient is compliant with BiPAP therapy and receives benefit from use. Excellent control. Receives benefit from use. Mask leaks noted but not affecting control or efficacy. Got a new BiPAP machine, which is working well. Aware of proper care/use. Understands risks of untreated OSA. He does have a pressure ulcer to his nose from current mask. Will switch him to DreamWear full face mask to remove pressure from this area. ONO on BiPAP/3 lpm re-ordered to ensure hypoxia corrected  Patient Instructions  Continue to use BiPAP every night with supplemental oxygen, minimum of 4-6 hours a night.  Change equipment as directed. Wash your tubing with warm soap and water daily, hang to dry. Wash humidifier portion weekly. Use bottled, distilled water and change daily Be aware of reduced alertness and do not drive or operate heavy machinery if experiencing this or drowsiness.  Exercise encouraged, as tolerated. Healthy weight management discussed.  Avoid or decrease alcohol consumption and medications that make you more sleepy, if possible. Notify if persistent daytime sleepiness occurs even with consistent use of PAP therapy.   Continue supplemental oxygen 3 lpm for goal >88-90% We will have you complete an overnight oxygen study on BiPAP and 3 lpm oxygen to ensure this corrects your oxygen levels. Someone should contact you to schedule this in the next few weeks. Please call if you haven't heard anything   Orders sent for new BiPAP mask - DreamWear full face   Apply a small mepilex foam dressing to the bridge of your nose before you put your BiPAP mask on. You will probably have to cut it smaller to just fit over the affected area. We may add some medihoney too if the wound doesn't improve once we change your mask   Follow up in 6 weeks with Dr. Wynona Neat or Philis Nettle. Video visit with KC. If symptoms worsen, please contact office for sooner follow up or seek emergency care.

## 2023-10-07 NOTE — Assessment & Plan Note (Signed)
 Bridge of nose. Will change BiPAP mask. Advised him to use mepilex foam dressing in interim. See above. No s/s of infection. Will monitor for improvement. May consider additional topical therapies and referral to dermatology if ulceration fails to improve

## 2023-10-07 NOTE — Assessment & Plan Note (Signed)
 Stable without increased O2 requirement. Goal >88-90%

## 2023-10-08 ENCOUNTER — Other Ambulatory Visit: Payer: Self-pay | Admitting: Internal Medicine

## 2023-10-10 ENCOUNTER — Other Ambulatory Visit: Payer: Self-pay

## 2023-10-20 DIAGNOSIS — G4730 Sleep apnea, unspecified: Secondary | ICD-10-CM | POA: Diagnosis not present

## 2023-10-20 DIAGNOSIS — R0902 Hypoxemia: Secondary | ICD-10-CM | POA: Diagnosis not present

## 2023-10-25 DIAGNOSIS — I5033 Acute on chronic diastolic (congestive) heart failure: Secondary | ICD-10-CM | POA: Diagnosis not present

## 2023-10-25 DIAGNOSIS — Z466 Encounter for fitting and adjustment of urinary device: Secondary | ICD-10-CM | POA: Diagnosis not present

## 2023-10-25 DIAGNOSIS — E1122 Type 2 diabetes mellitus with diabetic chronic kidney disease: Secondary | ICD-10-CM | POA: Diagnosis not present

## 2023-10-25 DIAGNOSIS — I130 Hypertensive heart and chronic kidney disease with heart failure and stage 1 through stage 4 chronic kidney disease, or unspecified chronic kidney disease: Secondary | ICD-10-CM | POA: Diagnosis not present

## 2023-10-25 DIAGNOSIS — J449 Chronic obstructive pulmonary disease, unspecified: Secondary | ICD-10-CM | POA: Diagnosis not present

## 2023-10-25 DIAGNOSIS — N1831 Chronic kidney disease, stage 3a: Secondary | ICD-10-CM | POA: Diagnosis not present

## 2023-11-09 ENCOUNTER — Other Ambulatory Visit: Payer: Self-pay

## 2023-11-09 ENCOUNTER — Other Ambulatory Visit: Payer: Self-pay | Admitting: Internal Medicine

## 2023-11-11 NOTE — Telephone Encounter (Signed)
 Community message sent to [place] regarding "documentation" needed for patient to get CPAP mask.  [employer] has access to Epic, trying to clarify what documentation they are still needing from our office.  Will await response from [employer].

## 2023-11-16 DIAGNOSIS — I130 Hypertensive heart and chronic kidney disease with heart failure and stage 1 through stage 4 chronic kidney disease, or unspecified chronic kidney disease: Secondary | ICD-10-CM | POA: Diagnosis not present

## 2023-11-16 DIAGNOSIS — J449 Chronic obstructive pulmonary disease, unspecified: Secondary | ICD-10-CM | POA: Diagnosis not present

## 2023-11-16 DIAGNOSIS — J9621 Acute and chronic respiratory failure with hypoxia: Secondary | ICD-10-CM | POA: Diagnosis not present

## 2023-11-16 DIAGNOSIS — E1122 Type 2 diabetes mellitus with diabetic chronic kidney disease: Secondary | ICD-10-CM | POA: Diagnosis not present

## 2023-11-16 DIAGNOSIS — I5033 Acute on chronic diastolic (congestive) heart failure: Secondary | ICD-10-CM | POA: Diagnosis not present

## 2023-11-16 DIAGNOSIS — N390 Urinary tract infection, site not specified: Secondary | ICD-10-CM | POA: Diagnosis not present

## 2023-11-16 DIAGNOSIS — I50811 Acute right heart failure: Secondary | ICD-10-CM | POA: Diagnosis not present

## 2023-11-16 DIAGNOSIS — Z466 Encounter for fitting and adjustment of urinary device: Secondary | ICD-10-CM | POA: Diagnosis not present

## 2023-11-16 DIAGNOSIS — N1832 Chronic kidney disease, stage 3b: Secondary | ICD-10-CM | POA: Diagnosis not present

## 2023-11-21 ENCOUNTER — Telehealth (INDEPENDENT_AMBULATORY_CARE_PROVIDER_SITE_OTHER): Payer: TRICARE For Life (TFL) | Admitting: Nurse Practitioner

## 2023-11-21 ENCOUNTER — Emergency Department (HOSPITAL_BASED_OUTPATIENT_CLINIC_OR_DEPARTMENT_OTHER)

## 2023-11-21 ENCOUNTER — Encounter (HOSPITAL_BASED_OUTPATIENT_CLINIC_OR_DEPARTMENT_OTHER): Payer: Self-pay | Admitting: Emergency Medicine

## 2023-11-21 ENCOUNTER — Inpatient Hospital Stay (HOSPITAL_BASED_OUTPATIENT_CLINIC_OR_DEPARTMENT_OTHER)
Admission: EM | Admit: 2023-11-21 | Discharge: 2023-11-25 | DRG: 698 | Disposition: A | Discharge status: Home Health | Attending: Internal Medicine | Admitting: Internal Medicine

## 2023-11-21 ENCOUNTER — Encounter: Payer: Self-pay | Admitting: Urology

## 2023-11-21 ENCOUNTER — Encounter: Payer: Self-pay | Admitting: Nurse Practitioner

## 2023-11-21 VITALS — Ht 68.0 in | Wt 245.0 lb

## 2023-11-21 DIAGNOSIS — M109 Gout, unspecified: Secondary | ICD-10-CM | POA: Diagnosis not present

## 2023-11-21 DIAGNOSIS — H9190 Unspecified hearing loss, unspecified ear: Secondary | ICD-10-CM | POA: Diagnosis present

## 2023-11-21 DIAGNOSIS — Z9981 Dependence on supplemental oxygen: Secondary | ICD-10-CM | POA: Diagnosis not present

## 2023-11-21 DIAGNOSIS — Z885 Allergy status to narcotic agent status: Secondary | ICD-10-CM

## 2023-11-21 DIAGNOSIS — I5032 Chronic diastolic (congestive) heart failure: Secondary | ICD-10-CM | POA: Diagnosis present

## 2023-11-21 DIAGNOSIS — N4889 Other specified disorders of penis: Secondary | ICD-10-CM | POA: Diagnosis not present

## 2023-11-21 DIAGNOSIS — R338 Other retention of urine: Secondary | ICD-10-CM | POA: Diagnosis present

## 2023-11-21 DIAGNOSIS — N401 Enlarged prostate with lower urinary tract symptoms: Secondary | ICD-10-CM | POA: Diagnosis present

## 2023-11-21 DIAGNOSIS — Z87891 Personal history of nicotine dependence: Secondary | ICD-10-CM

## 2023-11-21 DIAGNOSIS — J9611 Chronic respiratory failure with hypoxia: Secondary | ICD-10-CM | POA: Diagnosis present

## 2023-11-21 DIAGNOSIS — E66812 Obesity, class 2: Secondary | ICD-10-CM | POA: Diagnosis present

## 2023-11-21 DIAGNOSIS — Z803 Family history of malignant neoplasm of breast: Secondary | ICD-10-CM

## 2023-11-21 DIAGNOSIS — T83511A Infection and inflammatory reaction due to indwelling urethral catheter, initial encounter: Principal | ICD-10-CM | POA: Diagnosis present

## 2023-11-21 DIAGNOSIS — A419 Sepsis, unspecified organism: Secondary | ICD-10-CM | POA: Diagnosis not present

## 2023-11-21 DIAGNOSIS — J9811 Atelectasis: Secondary | ICD-10-CM | POA: Diagnosis not present

## 2023-11-21 DIAGNOSIS — Z7901 Long term (current) use of anticoagulants: Secondary | ICD-10-CM

## 2023-11-21 DIAGNOSIS — I130 Hypertensive heart and chronic kidney disease with heart failure and stage 1 through stage 4 chronic kidney disease, or unspecified chronic kidney disease: Secondary | ICD-10-CM | POA: Diagnosis not present

## 2023-11-21 DIAGNOSIS — Y846 Urinary catheterization as the cause of abnormal reaction of the patient, or of later complication, without mention of misadventure at the time of the procedure: Secondary | ICD-10-CM | POA: Diagnosis present

## 2023-11-21 DIAGNOSIS — I4821 Permanent atrial fibrillation: Secondary | ICD-10-CM | POA: Diagnosis present

## 2023-11-21 DIAGNOSIS — I2729 Other secondary pulmonary hypertension: Secondary | ICD-10-CM | POA: Diagnosis not present

## 2023-11-21 DIAGNOSIS — Z7984 Long term (current) use of oral hypoglycemic drugs: Secondary | ICD-10-CM

## 2023-11-21 DIAGNOSIS — I959 Hypotension, unspecified: Secondary | ICD-10-CM | POA: Diagnosis not present

## 2023-11-21 DIAGNOSIS — Z1152 Encounter for screening for COVID-19: Secondary | ICD-10-CM

## 2023-11-21 DIAGNOSIS — Z888 Allergy status to other drugs, medicaments and biological substances status: Secondary | ICD-10-CM

## 2023-11-21 DIAGNOSIS — N1832 Chronic kidney disease, stage 3b: Secondary | ICD-10-CM | POA: Diagnosis present

## 2023-11-21 DIAGNOSIS — L89321 Pressure ulcer of left buttock, stage 1: Secondary | ICD-10-CM | POA: Diagnosis present

## 2023-11-21 DIAGNOSIS — Z8042 Family history of malignant neoplasm of prostate: Secondary | ICD-10-CM

## 2023-11-21 DIAGNOSIS — J449 Chronic obstructive pulmonary disease, unspecified: Secondary | ICD-10-CM | POA: Diagnosis not present

## 2023-11-21 DIAGNOSIS — Z8249 Family history of ischemic heart disease and other diseases of the circulatory system: Secondary | ICD-10-CM

## 2023-11-21 DIAGNOSIS — E785 Hyperlipidemia, unspecified: Secondary | ICD-10-CM | POA: Diagnosis not present

## 2023-11-21 DIAGNOSIS — S3730XA Unspecified injury of urethra, initial encounter: Secondary | ICD-10-CM | POA: Diagnosis present

## 2023-11-21 DIAGNOSIS — N390 Urinary tract infection, site not specified: Secondary | ICD-10-CM | POA: Diagnosis present

## 2023-11-21 DIAGNOSIS — R000 Tachycardia, unspecified: Secondary | ICD-10-CM | POA: Diagnosis not present

## 2023-11-21 DIAGNOSIS — G4733 Obstructive sleep apnea (adult) (pediatric): Secondary | ICD-10-CM | POA: Diagnosis not present

## 2023-11-21 DIAGNOSIS — I5033 Acute on chronic diastolic (congestive) heart failure: Secondary | ICD-10-CM | POA: Diagnosis not present

## 2023-11-21 DIAGNOSIS — E1122 Type 2 diabetes mellitus with diabetic chronic kidney disease: Secondary | ICD-10-CM | POA: Diagnosis not present

## 2023-11-21 DIAGNOSIS — I5082 Biventricular heart failure: Secondary | ICD-10-CM | POA: Diagnosis not present

## 2023-11-21 DIAGNOSIS — Z6837 Body mass index (BMI) 37.0-37.9, adult: Secondary | ICD-10-CM

## 2023-11-21 DIAGNOSIS — J9612 Chronic respiratory failure with hypercapnia: Secondary | ICD-10-CM | POA: Diagnosis not present

## 2023-11-21 DIAGNOSIS — I700 Atherosclerosis of aorta: Secondary | ICD-10-CM | POA: Diagnosis not present

## 2023-11-21 DIAGNOSIS — I517 Cardiomegaly: Secondary | ICD-10-CM | POA: Diagnosis not present

## 2023-11-21 DIAGNOSIS — A4159 Other Gram-negative sepsis: Secondary | ICD-10-CM | POA: Diagnosis not present

## 2023-11-21 DIAGNOSIS — R319 Hematuria, unspecified: Secondary | ICD-10-CM | POA: Diagnosis present

## 2023-11-21 DIAGNOSIS — N1831 Chronic kidney disease, stage 3a: Secondary | ICD-10-CM | POA: Diagnosis not present

## 2023-11-21 DIAGNOSIS — Z820 Family history of epilepsy and other diseases of the nervous system: Secondary | ICD-10-CM

## 2023-11-21 DIAGNOSIS — Z79899 Other long term (current) drug therapy: Secondary | ICD-10-CM

## 2023-11-21 DIAGNOSIS — B964 Proteus (mirabilis) (morganii) as the cause of diseases classified elsewhere: Secondary | ICD-10-CM | POA: Diagnosis present

## 2023-11-21 DIAGNOSIS — E662 Morbid (severe) obesity with alveolar hypoventilation: Secondary | ICD-10-CM | POA: Diagnosis present

## 2023-11-21 DIAGNOSIS — Z466 Encounter for fitting and adjustment of urinary device: Secondary | ICD-10-CM | POA: Diagnosis not present

## 2023-11-21 LAB — CBC WITH DIFFERENTIAL/PLATELET
Abs Immature Granulocytes: 0.05 10*3/uL (ref 0.00–0.07)
Basophils Absolute: 0 10*3/uL (ref 0.0–0.1)
Basophils Relative: 0 %
Eosinophils Absolute: 0.1 10*3/uL (ref 0.0–0.5)
Eosinophils Relative: 1 %
HCT: 39.4 % (ref 39.0–52.0)
Hemoglobin: 12.9 g/dL — ABNORMAL LOW (ref 13.0–17.0)
Immature Granulocytes: 0 %
Lymphocytes Relative: 7 %
Lymphs Abs: 1 10*3/uL (ref 0.7–4.0)
MCH: 29.1 pg (ref 26.0–34.0)
MCHC: 32.7 g/dL (ref 30.0–36.0)
MCV: 88.7 fL (ref 80.0–100.0)
Monocytes Absolute: 1.1 10*3/uL — ABNORMAL HIGH (ref 0.1–1.0)
Monocytes Relative: 8 %
Neutro Abs: 12.5 10*3/uL — ABNORMAL HIGH (ref 1.7–7.7)
Neutrophils Relative %: 84 %
Platelets: 221 10*3/uL (ref 150–400)
RBC: 4.44 MIL/uL (ref 4.22–5.81)
RDW: 15.8 % — ABNORMAL HIGH (ref 11.5–15.5)
WBC: 14.8 10*3/uL — ABNORMAL HIGH (ref 4.0–10.5)
nRBC: 0 % (ref 0.0–0.2)

## 2023-11-21 LAB — URINALYSIS, W/ REFLEX TO CULTURE (INFECTION SUSPECTED)
Bilirubin Urine: NEGATIVE
Glucose, UA: NEGATIVE mg/dL
Ketones, ur: NEGATIVE mg/dL
Nitrite: NEGATIVE
Protein, ur: 30 mg/dL — AB
RBC / HPF: >50 RBC/hpf (ref 0–5)
Specific Gravity, Urine: 1.012 (ref 1.005–1.030)
WBC, UA: >50 WBC/hpf (ref 0–5)
pH: 7 (ref 5.0–8.0)

## 2023-11-21 LAB — COMPREHENSIVE METABOLIC PANEL WITH GFR
ALT: 12 U/L (ref 0–44)
AST: 15 U/L (ref 15–41)
Albumin: 3.7 g/dL (ref 3.5–5.0)
Alkaline Phosphatase: 64 U/L (ref 38–126)
Anion gap: 9 (ref 5–15)
BUN: 45 mg/dL — ABNORMAL HIGH (ref 8–23)
CO2: 26 mmol/L (ref 22–32)
Calcium: 9.8 mg/dL (ref 8.9–10.3)
Chloride: 105 mmol/L (ref 98–111)
Creatinine, Ser: 1.97 mg/dL — ABNORMAL HIGH (ref 0.61–1.24)
GFR, Estimated: 34 mL/min — ABNORMAL LOW (ref >60)
Glucose, Bld: 153 mg/dL — ABNORMAL HIGH (ref 70–99)
Potassium: 5 mmol/L (ref 3.5–5.1)
Sodium: 140 mmol/L (ref 135–145)
Total Bilirubin: 1.3 mg/dL — ABNORMAL HIGH (ref 0.0–1.2)
Total Protein: 7.3 g/dL (ref 6.5–8.1)

## 2023-11-21 LAB — PROTIME-INR
INR: 1.3 — ABNORMAL HIGH (ref 0.8–1.2)
Prothrombin Time: 16.4 s — ABNORMAL HIGH (ref 11.4–15.2)

## 2023-11-21 LAB — RESP PANEL BY RT-PCR (RSV, FLU A&B, COVID)  RVPGX2
Influenza A by PCR: NEGATIVE
Influenza B by PCR: NEGATIVE
Resp Syncytial Virus by PCR: NEGATIVE
SARS Coronavirus 2 by RT PCR: NEGATIVE

## 2023-11-21 LAB — LACTIC ACID, PLASMA: Lactic Acid, Venous: 1.6 mmol/L (ref 0.5–1.9)

## 2023-11-21 MED ORDER — SODIUM CHLORIDE 0.9 % IV SOLN
2.0000 g | Freq: Once | INTRAVENOUS | Status: AC
  Administered 2023-11-21: 2 g via INTRAVENOUS
  Filled 2023-11-21: qty 12.5

## 2023-11-21 MED ORDER — SODIUM CHLORIDE 0.9 % IV BOLUS
500.0000 mL | Freq: Once | INTRAVENOUS | Status: AC
  Administered 2023-11-21: 500 mL via INTRAVENOUS

## 2023-11-21 MED ORDER — ACETAMINOPHEN 325 MG PO TABS
650.0000 mg | ORAL_TABLET | Freq: Once | ORAL | Status: AC
  Administered 2023-11-21: 650 mg via ORAL
  Filled 2023-11-21: qty 2

## 2023-11-21 NOTE — Sepsis Progress Note (Signed)
 Elink monitoring for the code sepsis protocol.

## 2023-11-21 NOTE — Progress Notes (Unsigned)
 Patient ID: [name] [name], male     DOB: 13-Oct-1945, 78 y.o.      MRN: [id number]  Chief Complaint  Patient presents with   Follow-up    Doing well    Virtual Visit via Video Note  I connected with [name] [name] on 11/22/23 at  1:30 PM EDT by a video enabled telemedicine application and verified that I am speaking with the correct person using two identifiers.  Location: Patient: Home Provider: Office   I discussed the limitations of evaluation and management by telemedicine and the availability of in person appointments. The patient expressed understanding and agreed to proceed.  History of Present Illness: 78 year old male, former smoker (quit 1980) followed for severe OSA on BiPAP and chronic hypoxic/hypercapnic respiratory failure secondary to obesity hypoventilation syndrome. He is a patient of [name] [name] and last seen in office on 06/25/2022 by [name] [name]. Past medical history CHF, PAF, HTN, allergic rhinitis, DM, HLD, morbid obesity.    TEST/EVENTS:  02/09/2008 ABG: pH 7.37, pCO2 55.1, pO2 76.4 06/18/2008 PSG: AHI 64>> BiPAP 18/9 with 4 L oxygen 04/03/2009 spirometry: FEV1 2.08 (69%), FEV1% 77 01/18/2013 Ono on BiPAP: Basal SpO2 91.7%, low SpO2 83%; spent 32 minutes with SpO2 less than 88% 07/31/2019 BiPAP titration: BiPAP 16/12 with 4 L supplemental O2 01/12/2023 CT chest: heart enlarged. Atherosclerosis. Bronchial wall thickening. Atelectasis. Mild hydroureteronephrosis    07/19/2019: OV with Dr. Craige Cotta.  He was told he needed a follow-up visit for oxygen and BiPAP renewal.  Ambulatory oximetry today on room air; dropped to 86% and recovered to 90s with addition supplemental oxygen 4 L pulsed.  BiPAP titration study ordered for BiPAP/oxygen renewal.  Excellent compliance with BiPAP.   03/19/2022: OV with [name] [name] for overdue follow-up.  They also had some concerns regarding his oxygen.  Reported that about a month ago he was dropping into the mid to high 80s, despite his 5 L pulsed.  They  increased him to 6 L which seemed to correct this.  At the same time, he was also noted to have increased weight gain and increased shortness of breath.  He was seen by cardiology, who adjusted his torsemide with has corrected his volume status.  Today, he reports feeling better.  Feels like his breathing is at his baseline.  Has not had any more issues with oxygen dropping into the 80s at home.  Walking oximetry at OV - able to maintain saturations on 4 lpm POC. Excellent compliance with BiPAP.   06/25/2022: Today - follow up Patient presents today for follow-up with his daughter.  He has been doing well since he was here last.  No further issues with weight gain or lower extremity swelling.  He also feels like his breathing has been stable.  He has been using 4 L/min supplemental O2, which has been his baseline.  No increased cough, chest congestion, orthopnea, PND.  He is wearing BiPAP nightly without fail.  No current issues.  Does feel well controlled on this. 05/25/2022-06/23/2022 BiPAP auto IPAP [name] [name], EPAP min 8, PS 4 30/30 days; 100% >4 hr; av use 13 hours Leaks median 0, 95th 76.9 IPAP pressure 95th 14.9, EPAP 10.9 AHI 5  06/10/2023: OV with [name] [name] Discussed the use of AI scribe software for clinical note transcription with the patient, who gave verbal consent to proceed. History of Present Illness   [name] [name], a patient with a history of multiple hospitalizations, most recently due to acute hypoxic  respiratory failure secondary to acute on chronic heart failure and UTI sepsis, presents for a follow-up visit of OSA on BiPAP. His daughter helps to provide his history. The patient has been using a BiPAP machine, which is due for replacement. Despite some leaks, the patient has been managing well with the machine and reports no significant issues. The patient's energy levels during the day are satisfactory and he reports sleeping well at night with the BiPAP machine. The patient uses a full face  mask with the BiPAP machine. He does not drive. Denies morning headaches, sleep parasomnias/paralysis.  Since the last hospitalization in June 2024, the patient has been slowly improving. After a brief stay in a rehabilitation facility, the patient returned home at the end of July. The patient's strength appears to be returning to normal and breathing has been reported as fine. The patient is currently on three liters of oxygen during the day and at night with the BiPAP machine. He was previously on 4 lpm. Oxygen saturation during our visit was 99% on 3 lpm. Denies any significant cough, chest congestion, increased leg swelling, orthopnea.  05/10/2023-06/08/2023 BiPAP auto 16/8, PS 4 28/30 days; 93% >4 hr; average use 10 hr 46 min Pressure 95th IPAP 13.2, EPAP 9.2 Leaks 89.7 AHI 2.8  10/06/2023: OV with [name] [name]. Patient presents today for follow up with his daughter. He's been doing well with his BiPAP. He wears it every night. He does have a sore on the bridge of his nose that he can't seem to get rid of. Has not had this looked it. It lays right under his mask. No exudate, pain, fevers, chills. He still has some daytime tiredness but he is very sedentary and tends to sit in the house most days during the winter. 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 Breathing has been doing well. He uses 3-4 lpm on his oxygen. No low oxygen levels. No significant cough, wheezing, congestion. He did not have his overnight oxygen study after our last visit.  09/05/2023-10/04/2023: BiPAP auto 16/8, PS 4 30/30 days; 100% >4 hr; average use 10 hr [name] min Pressure 95th IPAP 14.4, EPAP 10.4 Leaks 95th 68 AHI 1.6  11/21/2023: Today - follow up Patient presents today via virtual visit for follow up with his daughter. He did have his ONO on BiPAP/3 lpm but our office does not have these records from the DME. He's using his BiPAP nightly. He has not gotten the new mask that was ordered yet.  The DME company told him they needed some additional information; unclear what this is. The sore on his nose has mostly resolved with applying a small dressing prior to putting BiPAP mask on. He does sleep better with his BiPAP and energy levels are better with it. He is relatively sedentary though. No issues with his breathing. No increased oxygen requirements. No significant cough, wheeze, congestion.   10/19/2023-11/17/2023 BiPAP auto 16/8, PS 4 30/30 days; 100% >4 hr; average use 10 hr 33 min Leaks 95th 57.2 AHI 1  Allergies  Allergen Reactions   Codeine     Altered mental status "goofy"   Heparin Rash     Abdominal rash 01/2013   Immunization History  Administered Date(s) Administered   Fluad Quad(high Dose 65+) 05/10/2019, 06/06/2020, 04/16/2021, 04/30/2022   Fluad Trivalent(High Dose 65+) 07/01/2023   H1N1 07/15/2008   Influenza Split 05/17/2011, 06/09/2012   Influenza Whole 05/09/2008, 06/24/2009, 06/26/2010   Influenza, High Dose  Seasonal PF 04/15/2015, 04/19/2017, 04/25/2018   Influenza,inj,Quad PF,6+ Mos 04/05/2013, 04/11/2014, 04/13/2016   PFIZER(Purple Top)SARS-COV-2 Vaccination 10/07/2019, 10/31/2019, 08/30/2020   Pneumococcal Conjugate-13 06/01/2013, 10/09/2013   Pneumococcal Polysaccharide-23 02/07/2008, 10/19/2017   Td 02/07/2008   Tdap 04/25/2018   Zoster Recombinant(Shingrix) 04/06/2019   Zoster, Live 07/02/2008   Past Medical History:  Diagnosis Date   Acute right-sided CHF (congestive heart failure) (HCC)    a. 01/2013.   Arthritis    "knees and hands; thoracic area of the spine" (01/05/2013)   BRONCHITIS, CHRONIC 01/02/2009   COMMON MIGRAINE    "none since treating for high BP"   Complication of anesthesia    "aspiration pneumonia after hand OR" (01/05/2013)   DIABETES MELLITUS, TYPE II    GOUT    HYPERLIPIDEMIA    HYPERTENSION    Long term (current) use of anticoagulants    Morbid obesity (HCC)    OBESITY HYPOVENTILATION SYNDROME    a. on home O2.    On home oxygen therapy    OSA (obstructive sleep apnea)    Permanent atrial fibrillation (HCC)    PROSTATE SPECIFIC ANTIGEN, ELEVATED 06/09/2007   Pulmonary HTN (HCC)    a. multifactorial including obstructive sleep apnea, obesity hypoventilation syndrome and possible pulmonary venous hypertension.   SKIN RASH 03/12/2010   Unspecified hearing loss     Tobacco History: Social History   Tobacco Use  Smoking Status Former   Current packs/day: 0.00   Average packs/day: 1 pack/day for 15.0 years (15.0 ttl pk-yrs)   Types: Cigarettes   Start date: 08/10/1963   Quit date: 08/09/1978   Years since quitting: 45.3  Smokeless Tobacco Never  Tobacco Comments   01/05/2013 "quit smoking ~ 40 years ago"   Counseling given: Not Answered Tobacco comments: 01/05/2013 "quit smoking ~ 40 years ago"   Facility-Administered Medications Prior to Visit  Medication Dose Route Frequency Provider Last Rate Last Admin   cyanocobalamin ((VITAMIN B-12)) injection 1,000 mcg  1,000 mcg Intramuscular Q30 days Corwin Levins, MD   1,000 mcg at 08/06/21 9604   Outpatient Medications Prior to Visit  Medication Sig Dispense Refill   acetaminophen (TYLENOL) 500 MG tablet Take 500-1,000 mg by mouth every 6 (six) hours as needed for moderate pain (pain score 4-6).     allopurinol (ZYLOPRIM) 100 MG tablet Take 1 tablet (100 mg total) by mouth daily. 90 tablet 3   ammonium lactate (LAC-HYDRIN) 12 % lotion Apply 1 Application topically as needed for dry skin.     apixaban (ELIQUIS) 5 MG TABS tablet Take 1 tablet (5 mg total) by mouth 2 (two) times daily. 180 tablet 1   atorvastatin (LIPITOR) [name] MG tablet Take 1 tablet ([name] mg total) by mouth daily. 90 tablet 3   cholecalciferol (VITAMIN D3) 25 MCG (1000 UNIT) tablet Take 2,000 Units by mouth daily.     cyanocobalamin (VITAMIN B12) 1000 MCG/ML injection Inject 1 mL (1,000 mcg total) into the muscle every 30 (thirty) days. 3 mL 3   glipiZIDE (GLUCOTROL XL) 10 MG 24 hr tablet  TAKE 1 TABLET(10 MG) BY MOUTH DAILY WITH BREAKFAST 90 tablet 3   metoprolol tartrate (LOPRESSOR) 25 MG tablet TAKE 1 TABLET(25 MG) BY MOUTH TWICE DAILY 180 tablet 3   nystatin (MYCOSTATIN/NYSTOP) powder Apply 1 Application topically 2 (two) times daily. 60 g 3   nystatin cream (MYCOSTATIN) Apply 1 Application topically 2 (two) times daily. 30 g 4   OXYGEN Inhale 3-5 L into the lungs daily. 3L Daily  4L Resting  5L Exertion     polyethylene glycol (MIRALAX / GLYCOLAX) 17 g packet Take 17 g by mouth daily as needed. 14 each 0   spironolactone (ALDACTONE) 25 MG tablet Take 1 tablet (25 mg total) by mouth daily. 90 tablet 3   tamsulosin (FLOMAX) 0.4 MG CAPS capsule TAKE 1 CAPSULE BY MOUTH EVERY DAY AFTER SUPPER 30 capsule 0   torsemide (DEMADEX) [name] MG tablet Take 1 tablet ([name] mg total) by mouth daily.     Lancets MISC Use as directed once daily  E11.9 100 each 12     Review of Systems:   Constitutional: No weight loss or gain, night sweats, fevers, chills. +stable fatigue, lassitude. HEENT: No headaches, difficulty swallowing, tooth/dental problems, or sore throat. No sneezing, itching, ear ache, nasal congestion, or post nasal drip CV:  +swelling in lower extremities (baseline). No chest pain, orthopnea, PND, anasarca, dizziness, palpitations, syncope Resp: +shortness of breath with exertion (baseline). No excess mucus or change in color of mucus. No productive or non-productive. No hemoptysis. No wheezing.  No chest wall deformity Skin: No rash, lesions, ulcerations MSK:  No joint pain or swelling.   Neuro: No dizziness or lightheadedness.  Psych: No depression or anxiety. Mood stable.   Observations/Objective: Patient is well-developed, chronically-ill appearing, in no acute distress. A&Ox3. Hard of hearing. Resting comfortably at home. Unlabored breathing. Speech is clear and coherent with logical content.    Assessment and Plan:    OSA (obstructive sleep apnea) Severe OSA/OHS.  Patient is compliant with BiPAP therapy and receives benefit from use. Excellent control. Receives benefit from use. Mask leaks noted but not affecting control or efficacy. Aware of proper care/use. Understands risks of untreated OSA. Resolving pressure ulcer to his nose from current mask. Would recommend he still switch him to DreamWear full face mask to remove pressure from this area. Will follow up with the DME on this order as well as ONO results.   Patient Instructions  Continue to use BiPAP every night with supplemental oxygen, minimum of 4-6 hours a night.  Change equipment as directed. Wash your tubing with warm soap and water daily, hang to dry. Wash humidifier portion weekly. Use bottled, distilled water and change daily Be aware of reduced alertness and do not drive or operate heavy machinery if experiencing this or drowsiness.  Exercise encouraged, as tolerated. Healthy weight management discussed.  Avoid or decrease alcohol consumption and medications that make you more sleepy, if possible. Notify if persistent daytime sleepiness occurs even with consistent use of PAP therapy.   Continue supplemental oxygen 3 lpm for goal >88-90%   Will follow up on the new BiPAP mask and the overnight oxygen order. Will call you regarding these results    Follow up in 6 months with Dr. [name] or [name].  If symptoms worsen, please contact office for sooner follow up or seek emergency care.    Chronic respiratory failure with hypoxia and hypercapnia related to obesity hypoventilation syndrome Stable without increased O2 requirements. Goal >88-90%         I discussed the assessment and treatment plan with the patient. The patient was provided an opportunity to ask questions and all were answered. The patient agreed with the plan and demonstrated an understanding of the instructions.   The patient was advised to call back or seek an in-person evaluation if the symptoms worsen or if the  condition fails to improve as anticipated.  I provided 22 minutes of non-face-to-face time  during this encounter.   Roetta Clarke, [name]

## 2023-11-21 NOTE — ED Triage Notes (Signed)
 Urinary catheter changed this AM,  now having pain, leaking and some bleeding

## 2023-11-21 NOTE — ED Provider Notes (Signed)
 [place] EMERGENCY DEPARTMENT AT [facility] Provider Note   CSN: [id number] Arrival date & time: 11/21/23  1909     History  No chief complaint on file.   [name] [name] is a 78 y.o. male.  Patient is a 78 year old male with a history of hyperlipidemia, hypertension, diabetes, CHF, a-fib on Eliquis and chronic indwelling Foley catheter.  History is obtained from family member who is at bedside as well as the patient.  They state that home health nurse change the catheter out today.  Since that, he has had a lot of pain in his penis and has had some blood noticed in his Foley catheter.  He has some pain in his lower abdomen.  And since he arrived to the emergency department, he has noted some chills.  No nausea or vomiting.  No abdominal pain other than some pain to his groin/bladder area.  No skin wounds or sores.  He states he has a little bit of congestion but that is more of a chronic issue.       Home Medications Prior to Admission medications   Medication Sig Start Date End Date Taking? Authorizing Provider  acetaminophen (TYLENOL) 500 MG tablet Take 500-1,000 mg by mouth every 6 (six) hours as needed for moderate pain (pain score 4-6).    [provider]  allopurinol (ZYLOPRIM) 100 MG tablet Take 1 tablet (100 mg total) by mouth daily. 05/18/23   Corwin Levins, MD  ammonium lactate (LAC-HYDRIN) 12 % lotion Apply topically. 02/20/23   [provider]  apixaban (ELIQUIS) 5 MG TABS tablet Take 1 tablet (5 mg total) by mouth 2 (two) times daily. 08/17/23   Lewayne Bunting, MD  atorvastatin (LIPITOR) 20 MG tablet Take 1 tablet (20 mg total) by mouth daily. 06/14/23   Corwin Levins, MD  cholecalciferol (VITAMIN D3) 25 MCG (1000 UNIT) tablet Take 2,000 Units by mouth daily.    [provider]  cyanocobalamin (VITAMIN B12) 1000 MCG/ML injection Inject 1 mL (1,000 mcg total) into the muscle every 30 (thirty) days. 07/01/23   Corwin Levins, MD  glipiZIDE  (GLUCOTROL XL) 10 MG 24 hr tablet TAKE 1 TABLET(10 MG) BY MOUTH DAILY WITH BREAKFAST 08/17/23   Corwin Levins, MD  Lancets MISC Use as directed once daily  E11.9 04/19/17   Corwin Levins, MD  metoprolol tartrate (LOPRESSOR) 25 MG tablet TAKE 1 TABLET(25 MG) BY MOUTH TWICE DAILY 08/17/23   Corwin Levins, MD  nystatin (MYCOSTATIN/NYSTOP) powder Apply 1 Application topically 2 (two) times daily. 09/09/23   Stoneking, Danford Bad., MD  nystatin cream (MYCOSTATIN) Apply 1 Application topically 2 (two) times daily. 09/09/23   Stoneking, Danford Bad., MD  OXYGEN Inhale 3-5 L into the lungs daily. 3L Daily  4L Resting  5L Exertion    [provider]  polyethylene glycol (MIRALAX / GLYCOLAX) 17 g packet Take 17 g by mouth daily as needed. 02/10/23   Zannie Cove, MD  spironolactone (ALDACTONE) 25 MG tablet Take 1 tablet (25 mg total) by mouth daily. 05/13/23   Lewayne Bunting, MD  tamsulosin (FLOMAX) 0.4 MG CAPS capsule TAKE 1 CAPSULE BY MOUTH EVERY DAY AFTER SUPPER 11/09/23   Corwin Levins, MD  torsemide (DEMADEX) 20 MG tablet Take 1 tablet (20 mg total) by mouth daily. 02/11/23   Zannie Cove, MD      Allergies    Codeine and Heparin    Review of Systems   Review of  Systems  Constitutional:  Positive for chills and fever. Negative for diaphoresis and fatigue.  HENT:  Positive for congestion. Negative for rhinorrhea and sneezing.   Eyes: Negative.   Respiratory:  Negative for cough, chest tightness and shortness of breath.   Cardiovascular:  Negative for chest pain and leg swelling.  Gastrointestinal:  Negative for abdominal pain, blood in stool, diarrhea, nausea and vomiting.  Genitourinary:  Positive for hematuria and penile pain. Negative for difficulty urinating, flank pain and frequency.  Musculoskeletal:  Negative for arthralgias and back pain.  Skin:  Negative for rash.  Neurological:  Negative for dizziness, speech difficulty, weakness, numbness and headaches.    Physical Exam Updated  Vital Signs BP 117/72 (BP Location: Right Arm)   Pulse 94   Temp (!) 101.6 F (38.7 C) (Oral)   Resp (!) 22   SpO2 97%  Physical Exam Constitutional:      Appearance: He is well-developed.  HENT:     Head: Normocephalic and atraumatic.  Eyes:     Pupils: Pupils are equal, round, and reactive to light.  Cardiovascular:     Rate and Rhythm: Regular rhythm. Tachycardia present.     Heart sounds: Normal heart sounds.  Pulmonary:     Effort: Pulmonary effort is normal. No respiratory distress.     Breath sounds: Normal breath sounds. No wheezing or rales.  Chest:     Chest wall: No tenderness.  Abdominal:     General: Bowel sounds are normal.     Palpations: Abdomen is soft.     Tenderness: There is abdominal tenderness (Mild tenderness to the suprapubic area.). There is no guarding or rebound.  Genitourinary:    Comments: Foley catheter is in place, there are some blood noted in the tubing.  There is some cloudy urine.  There are some dried blood around his urethral meatus. Musculoskeletal:        General: Normal range of motion.     Cervical back: Normal range of motion and neck supple.  Lymphadenopathy:     Cervical: No cervical adenopathy.  Skin:    General: Skin is warm and dry.     Findings: No rash.  Neurological:     Mental Status: He is alert and oriented to person, place, and time.     ED Results / Procedures / Treatments   Labs (all labs ordered are listed, but only abnormal results are displayed) Labs Reviewed  COMPREHENSIVE METABOLIC PANEL WITH GFR - Abnormal; Notable for the following components:      Result Value   Glucose, Bld 153 (*)    BUN 45 (*)    Creatinine, Ser 1.97 (*)    Total Bilirubin 1.3 (*)    GFR, Estimated 34 (*)    All other components within normal limits  CBC WITH DIFFERENTIAL/PLATELET - Abnormal; Notable for the following components:   WBC 14.8 (*)    Hemoglobin 12.9 (*)    RDW 15.8 (*)    Neutro Abs 12.5 (*)    Monocytes Absolute  1.1 (*)    All other components within normal limits  PROTIME-INR - Abnormal; Notable for the following components:   Prothrombin Time 16.4 (*)    INR 1.3 (*)    All other components within normal limits  URINALYSIS, W/ REFLEX TO CULTURE (INFECTION SUSPECTED) - Abnormal; Notable for the following components:   APPearance CLOUDY (*)    Hgb urine dipstick LARGE (*)    Protein, ur 30 (*)  Leukocytes,Ua LARGE (*)    Bacteria, UA RARE (*)    All other components within normal limits  RESP PANEL BY RT-PCR (RSV, FLU A&B, COVID)  RVPGX2  CULTURE, BLOOD (ROUTINE X 2)  CULTURE, BLOOD (ROUTINE X 2)  URINE CULTURE  LACTIC ACID, PLASMA  LACTIC ACID, PLASMA    EKG EKG Interpretation Date/Time:  Monday November 21 2023 20:59:48 EDT Ventricular Rate:  105 PR Interval:    QRS Duration:  99 QT Interval:  341 QTC Calculation: 447 R Axis:   87  Text Interpretation: Atrial fibrillation Borderline right axis deviation Low voltage, extremity and precordial leads Abnormal R-wave progression, late transition since last tracing no significant change Confirmed by Rolan Bucco [phone]) on 11/21/2023 9:32:28 PM  Radiology DG Chest Port 1 View Result Date: 11/21/2023 CLINICAL DATA:  Possible sepsis EXAM: PORTABLE CHEST 1 VIEW COMPARISON:  01/30/2023 FINDINGS: Cardiac shadow is enlarged but stable. Aortic calcifications are noted. The lungs are well aerated bilaterally. Mild left basilar atelectasis is seen. No bony abnormality is noted. IMPRESSION: Mild left basilar atelectasis is noted. Electronically Signed   By: Alcide Clever M.D.   On: 11/21/2023 21:24    Procedures Procedures    Medications Ordered in ED Medications  ceFEPIme (MAXIPIME) 2 g in sodium chloride 0.9 % 100 mL IVPB (0 g Intravenous Stopped 11/21/23 2218)  acetaminophen (TYLENOL) tablet 650 mg (650 mg Oral Given 11/21/23 2156)    ED Course/ Medical Decision Making/ A&P                                 Medical Decision Making Amount  and/or Complexity of Data Reviewed Labs: ordered. Radiology: ordered.  Risk OTC drugs. Decision regarding hospitalization.   Patient is a 78 year old male who presents with pain in his penis after he had his catheter changed today.  He also has some hematuria and cloudy urine.  He was noted to be febrile on arrival.  He was tachycardic.  He was treated for sepsis.  Was started on IV fluids and IV antibiotics for presumed urinary source.  He did have a chest x-ray which does not show any evidence of pneumonia.  This was interpreted by me and confirmed by the radiologist.  His WBC count is elevated.  His lactate is normal.  We did adjust the catheter and he is more comfortable.  It is draining well.  He does not have any concerns for retention.  Will plan admission.  Discussed with Dr. Toniann Fail who will admit the patient.   CRITICAL CARE Performed by: Rolan Bucco Total critical care time: 60 minutes Critical care time was exclusive of separately billable procedures and treating other patients. Critical care was necessary to treat or prevent imminent or life-threatening deterioration. Critical care was time spent personally by me on the following activities: development of treatment plan with patient and/or surrogate as well as nursing, discussions with consultants, evaluation of patient's response to treatment, examination of patient, obtaining history from patient or surrogate, ordering and performing treatments and interventions, ordering and review of laboratory studies, ordering and review of radiographic studies, pulse oximetry and re-evaluation of patient's condition.   Final Clinical Impression(s) / ED Diagnoses Final diagnoses:  Sepsis without acute organ dysfunction, due to unspecified organism [facility])  Urinary tract infection associated with indwelling urethral catheter, initial encounter (HCC)    Rx / DC Orders ED Discharge Orders     None  Hershel Los,  MD 11/21/23 [phone]

## 2023-11-22 ENCOUNTER — Other Ambulatory Visit: Payer: Self-pay

## 2023-11-22 ENCOUNTER — Encounter (HOSPITAL_COMMUNITY): Payer: Self-pay | Admitting: Internal Medicine

## 2023-11-22 ENCOUNTER — Encounter: Payer: Self-pay | Admitting: Nurse Practitioner

## 2023-11-22 ENCOUNTER — Inpatient Hospital Stay (HOSPITAL_COMMUNITY)

## 2023-11-22 DIAGNOSIS — I959 Hypotension, unspecified: Secondary | ICD-10-CM | POA: Diagnosis present

## 2023-11-22 DIAGNOSIS — A4159 Other Gram-negative sepsis: Secondary | ICD-10-CM | POA: Diagnosis present

## 2023-11-22 DIAGNOSIS — I130 Hypertensive heart and chronic kidney disease with heart failure and stage 1 through stage 4 chronic kidney disease, or unspecified chronic kidney disease: Secondary | ICD-10-CM | POA: Diagnosis present

## 2023-11-22 DIAGNOSIS — M109 Gout, unspecified: Secondary | ICD-10-CM | POA: Diagnosis present

## 2023-11-22 DIAGNOSIS — A419 Sepsis, unspecified organism: Secondary | ICD-10-CM

## 2023-11-22 DIAGNOSIS — I5082 Biventricular heart failure: Secondary | ICD-10-CM | POA: Diagnosis present

## 2023-11-22 DIAGNOSIS — N401 Enlarged prostate with lower urinary tract symptoms: Secondary | ICD-10-CM | POA: Diagnosis present

## 2023-11-22 DIAGNOSIS — E66812 Obesity, class 2: Secondary | ICD-10-CM | POA: Diagnosis present

## 2023-11-22 DIAGNOSIS — R338 Other retention of urine: Secondary | ICD-10-CM | POA: Diagnosis present

## 2023-11-22 DIAGNOSIS — E785 Hyperlipidemia, unspecified: Secondary | ICD-10-CM | POA: Diagnosis present

## 2023-11-22 DIAGNOSIS — J9611 Chronic respiratory failure with hypoxia: Secondary | ICD-10-CM | POA: Diagnosis present

## 2023-11-22 DIAGNOSIS — S3730XA Unspecified injury of urethra, initial encounter: Secondary | ICD-10-CM | POA: Diagnosis present

## 2023-11-22 DIAGNOSIS — E662 Morbid (severe) obesity with alveolar hypoventilation: Secondary | ICD-10-CM | POA: Diagnosis present

## 2023-11-22 DIAGNOSIS — E1122 Type 2 diabetes mellitus with diabetic chronic kidney disease: Secondary | ICD-10-CM | POA: Diagnosis present

## 2023-11-22 DIAGNOSIS — N390 Urinary tract infection, site not specified: Secondary | ICD-10-CM | POA: Diagnosis present

## 2023-11-22 DIAGNOSIS — Z6837 Body mass index (BMI) 37.0-37.9, adult: Secondary | ICD-10-CM | POA: Diagnosis not present

## 2023-11-22 DIAGNOSIS — L89321 Pressure ulcer of left buttock, stage 1: Secondary | ICD-10-CM | POA: Diagnosis present

## 2023-11-22 DIAGNOSIS — I2729 Other secondary pulmonary hypertension: Secondary | ICD-10-CM | POA: Diagnosis present

## 2023-11-22 DIAGNOSIS — B964 Proteus (mirabilis) (morganii) as the cause of diseases classified elsewhere: Secondary | ICD-10-CM | POA: Diagnosis present

## 2023-11-22 DIAGNOSIS — K5730 Diverticulosis of large intestine without perforation or abscess without bleeding: Secondary | ICD-10-CM | POA: Diagnosis not present

## 2023-11-22 DIAGNOSIS — I5032 Chronic diastolic (congestive) heart failure: Secondary | ICD-10-CM | POA: Diagnosis present

## 2023-11-22 DIAGNOSIS — I4821 Permanent atrial fibrillation: Secondary | ICD-10-CM | POA: Diagnosis present

## 2023-11-22 DIAGNOSIS — N1832 Chronic kidney disease, stage 3b: Secondary | ICD-10-CM | POA: Diagnosis present

## 2023-11-22 DIAGNOSIS — Z1152 Encounter for screening for COVID-19: Secondary | ICD-10-CM | POA: Diagnosis not present

## 2023-11-22 DIAGNOSIS — N281 Cyst of kidney, acquired: Secondary | ICD-10-CM | POA: Diagnosis not present

## 2023-11-22 DIAGNOSIS — Y846 Urinary catheterization as the cause of abnormal reaction of the patient, or of later complication, without mention of misadventure at the time of the procedure: Secondary | ICD-10-CM | POA: Diagnosis present

## 2023-11-22 DIAGNOSIS — N4889 Other specified disorders of penis: Secondary | ICD-10-CM | POA: Diagnosis present

## 2023-11-22 DIAGNOSIS — T83511A Infection and inflammatory reaction due to indwelling urethral catheter, initial encounter: Secondary | ICD-10-CM | POA: Diagnosis present

## 2023-11-22 DIAGNOSIS — H9190 Unspecified hearing loss, unspecified ear: Secondary | ICD-10-CM | POA: Diagnosis present

## 2023-11-22 LAB — BLOOD CULTURE ID PANEL (REFLEXED) - BCID2

## 2023-11-22 LAB — CBC
HCT: 37.4 % — ABNORMAL LOW (ref 39.0–52.0)
Hemoglobin: 11.6 g/dL — ABNORMAL LOW (ref 13.0–17.0)
MCH: 28.7 pg (ref 26.0–34.0)
MCHC: 31 g/dL (ref 30.0–36.0)
MCV: 92.6 fL (ref 80.0–100.0)
Platelets: 173 10*3/uL (ref 150–400)
RBC: 4.04 MIL/uL — ABNORMAL LOW (ref 4.22–5.81)
RDW: 15.9 % — ABNORMAL HIGH (ref 11.5–15.5)
WBC: 13.8 10*3/uL — ABNORMAL HIGH (ref 4.0–10.5)
nRBC: 0 % (ref 0.0–0.2)

## 2023-11-22 LAB — BASIC METABOLIC PANEL WITH GFR
Anion gap: 9 (ref 5–15)
BUN: 43 mg/dL — ABNORMAL HIGH (ref 8–23)
CO2: 22 mmol/L (ref 22–32)
Calcium: 8.9 mg/dL (ref 8.9–10.3)
Chloride: 110 mmol/L (ref 98–111)
Creatinine, Ser: 1.91 mg/dL — ABNORMAL HIGH (ref 0.61–1.24)
GFR, Estimated: 36 mL/min — ABNORMAL LOW (ref >60)
Glucose, Bld: 103 mg/dL — ABNORMAL HIGH (ref 70–99)
Potassium: 4.3 mmol/L (ref 3.5–5.1)
Sodium: 141 mmol/L (ref 135–145)

## 2023-11-22 LAB — LACTIC ACID, PLASMA: Lactic Acid, Venous: 1.4 mmol/L (ref 0.5–1.9)

## 2023-11-22 LAB — GLUCOSE, CAPILLARY
Glucose-Capillary: 119 mg/dL — ABNORMAL HIGH (ref 70–99)
Glucose-Capillary: 128 mg/dL — ABNORMAL HIGH (ref 70–99)
Glucose-Capillary: 106 mg/dL — ABNORMAL HIGH (ref 70–99)
Glucose-Capillary: 138 mg/dL — ABNORMAL HIGH (ref 70–99)

## 2023-11-22 LAB — MAGNESIUM: Magnesium: 1.8 mg/dL (ref 1.7–2.4)

## 2023-11-22 LAB — PHOSPHORUS: Phosphorus: 3.1 mg/dL (ref 2.5–4.6)

## 2023-11-22 MED ORDER — ONDANSETRON HCL 4 MG/2ML IJ SOLN: 4.0000 mg | Freq: Four times a day (QID) | INTRAMUSCULAR | Status: DC | PRN

## 2023-11-22 MED ORDER — CHLORHEXIDINE GLUCONATE CLOTH 2 % EX PADS
6.0000 | MEDICATED_PAD | Freq: Every day | CUTANEOUS | Status: DC
  Administered 2023-11-22 – 2023-11-25 (×4): 6 via TOPICAL

## 2023-11-22 MED ORDER — TORSEMIDE 20 MG PO TABS
20.0000 mg | ORAL_TABLET | Freq: Every day | ORAL | Status: DC
  Administered 2023-11-22 – 2023-11-25 (×4): 20 mg via ORAL
  Filled 2023-11-22 (×4): qty 1

## 2023-11-22 MED ORDER — ALLOPURINOL 100 MG PO TABS
100.0000 mg | ORAL_TABLET | Freq: Every day | ORAL | Status: DC
  Administered 2023-11-22 – 2023-11-25 (×4): 100 mg via ORAL
  Filled 2023-11-22 (×4): qty 1

## 2023-11-22 MED ORDER — POLYETHYLENE GLYCOL 3350 17 G PO PACK: 17.0000 g | PACK | Freq: Every day | ORAL | Status: DC | PRN

## 2023-11-22 MED ORDER — IOHEXOL 9 MG/ML PO SOLN
500.0000 mL | ORAL | Status: AC
  Administered 2023-11-22 (×2): 500 mL via ORAL

## 2023-11-22 MED ORDER — APIXABAN 5 MG PO TABS
5.0000 mg | ORAL_TABLET | Freq: Two times a day (BID) | ORAL | Status: DC
Start: 2023-11-22 — End: 2023-11-25
  Administered 2023-11-22 – 2023-11-25 (×7): 5 mg via ORAL
  Filled 2023-11-22 (×7): qty 1

## 2023-11-22 MED ORDER — ATORVASTATIN CALCIUM 10 MG PO TABS
20.0000 mg | ORAL_TABLET | Freq: Every day | ORAL | Status: DC
  Administered 2023-11-22 – 2023-11-25 (×4): 20 mg via ORAL
  Filled 2023-11-22 (×4): qty 2

## 2023-11-22 MED ORDER — SODIUM CHLORIDE 0.9% FLUSH
3.0000 mL | Freq: Two times a day (BID) | INTRAVENOUS | Status: DC
  Administered 2023-11-22 – 2023-11-25 (×7): 3 mL via INTRAVENOUS

## 2023-11-22 MED ORDER — SODIUM CHLORIDE 0.9 % IV SOLN
2.0000 g | Freq: Every day | INTRAVENOUS | Status: DC
  Administered 2023-11-22 – 2023-11-23 (×2): 2 g via INTRAVENOUS
  Filled 2023-11-22 (×2): qty 20

## 2023-11-22 MED ORDER — SODIUM CHLORIDE 0.9 % IV BOLUS
500.0000 mL | Freq: Once | INTRAVENOUS | Status: AC
  Administered 2023-11-22: 500 mL via INTRAVENOUS

## 2023-11-22 MED ORDER — IOHEXOL 9 MG/ML PO SOLN
ORAL | Status: AC
  Filled 2023-11-22: qty 1000

## 2023-11-22 MED ORDER — IOHEXOL 300 MG/ML  SOLN
80.0000 mL | Freq: Once | INTRAMUSCULAR | Status: AC | PRN
  Administered 2023-11-22: 80 mL via INTRAVENOUS

## 2023-11-22 MED ORDER — ACETAMINOPHEN 500 MG PO TABS
1000.0000 mg | ORAL_TABLET | Freq: Four times a day (QID) | ORAL | Status: DC | PRN
  Administered 2023-11-22 – 2023-11-24 (×4): 1000 mg via ORAL
  Filled 2023-11-22 (×5): qty 2

## 2023-11-22 MED ORDER — METOPROLOL TARTRATE 25 MG PO TABS
25.0000 mg | ORAL_TABLET | Freq: Two times a day (BID) | ORAL | Status: DC
  Administered 2023-11-23 – 2023-11-25 (×4): 25 mg via ORAL
  Filled 2023-11-22 (×7): qty 1

## 2023-11-22 MED ORDER — INSULIN ASPART 100 UNIT/ML IJ SOLN: 0.0000 [IU] | Freq: Three times a day (TID) | INTRAMUSCULAR | Status: DC

## 2023-11-22 MED ORDER — MELATONIN 3 MG PO TABS: 6.0000 mg | ORAL_TABLET | Freq: Every evening | ORAL | Status: DC | PRN

## 2023-11-22 MED ORDER — SPIRONOLACTONE 25 MG PO TABS
25.0000 mg | ORAL_TABLET | Freq: Every day | ORAL | Status: DC
  Administered 2023-11-22 – 2023-11-25 (×4): 25 mg via ORAL
  Filled 2023-11-22 (×4): qty 1

## 2023-11-22 MED ORDER — TAMSULOSIN HCL 0.4 MG PO CAPS: 0.4000 mg | ORAL_CAPSULE | Freq: Every day | ORAL | Status: DC

## 2023-11-22 MED ORDER — SODIUM CHLORIDE 0.9 % IV SOLN
2.0000 g | Freq: Two times a day (BID) | INTRAVENOUS | Status: DC
  Administered 2023-11-22: 2 g via INTRAVENOUS
  Filled 2023-11-22: qty 12.5

## 2023-11-22 NOTE — Plan of Care (Signed)
  Problem: Education: Goal: Knowledge of General Education information will improve Description: Including pain rating scale, medication(s)/side effects and non-pharmacologic comfort measures Outcome: Progressing   Problem: Clinical Measurements: Goal: Will remain free from infection Outcome: Progressing Goal: Diagnostic test results will improve Outcome: Progressing Goal: Respiratory complications will improve Outcome: Progressing   Problem: Activity: Goal: Risk for activity intolerance will decrease Outcome: Progressing   Problem: Elimination: Goal: Will not experience complications related to urinary retention Outcome: Progressing   Problem: Pain Managment: Goal: General experience of comfort will improve and/or be controlled Outcome: Progressing

## 2023-11-22 NOTE — Plan of Care (Signed)

## 2023-11-22 NOTE — Patient Instructions (Signed)
 Continue to use BiPAP every night with supplemental oxygen, minimum of 4-6 hours a night.  Change equipment as directed. Wash your tubing with warm soap and water daily, hang to dry. Wash humidifier portion weekly. Use bottled, distilled water and change daily Be aware of reduced alertness and do not drive or operate heavy machinery if experiencing this or drowsiness.  Exercise encouraged, as tolerated. Healthy weight management discussed.  Avoid or decrease alcohol consumption and medications that make you more sleepy, if possible. Notify if persistent daytime sleepiness occurs even with consistent use of PAP therapy.   Continue supplemental oxygen 3 lpm for goal >88-90%   Will follow up on the new BiPAP mask and the overnight oxygen order. Will call you regarding these results    Follow up in 6 months with Dr. [name] or [name].  If symptoms worsen, please contact office for sooner follow up or seek emergency care.

## 2023-11-22 NOTE — Progress Notes (Signed)
 PHARMACY - PHYSICIAN COMMUNICATION CRITICAL VALUE ALERT - BLOOD CULTURE IDENTIFICATION (BCID)  [name] [name] is an 78 y.o. male who presented to [facility] on 11/21/2023 with a chief complaint of complicated UTI  Assessment:  1/4 BCx bottles growing Proteus spp (suspect urinary source)  Name of physician (or Provider) ContactedTina Fordyce, NP  Current antibiotics: Cefepime  Changes to prescribed antibiotics recommended: Narrow Cefepime to Rocephin 2g daily starting tonight >> orders adjusted per Pharmacy BCID protocol (NP aware)   Results for orders placed or performed during the hospital encounter of 01/12/23  Blood Culture ID Panel (Reflexed) (Collected: 01/12/2023  8:24 PM)  Result Value Ref Range   Enterococcus faecalis NOT DETECTED NOT DETECTED   Enterococcus Faecium NOT DETECTED NOT DETECTED   Listeria monocytogenes NOT DETECTED NOT DETECTED   Staphylococcus species NOT DETECTED NOT DETECTED   Staphylococcus aureus (BCID) NOT DETECTED NOT DETECTED   Staphylococcus epidermidis NOT DETECTED NOT DETECTED   Staphylococcus lugdunensis NOT DETECTED NOT DETECTED   Streptococcus species NOT DETECTED NOT DETECTED   Streptococcus agalactiae NOT DETECTED NOT DETECTED   Streptococcus pneumoniae NOT DETECTED NOT DETECTED   Streptococcus pyogenes NOT DETECTED NOT DETECTED   A.calcoaceticus-baumannii NOT DETECTED NOT DETECTED   Bacteroides fragilis NOT DETECTED NOT DETECTED   Enterobacterales DETECTED (A) NOT DETECTED   Enterobacter cloacae complex NOT DETECTED NOT DETECTED   Escherichia coli NOT DETECTED NOT DETECTED   Klebsiella aerogenes NOT DETECTED NOT DETECTED   Klebsiella oxytoca NOT DETECTED NOT DETECTED   Klebsiella pneumoniae DETECTED (A) NOT DETECTED   Proteus species NOT DETECTED NOT DETECTED   Salmonella species NOT DETECTED NOT DETECTED   Serratia marcescens NOT DETECTED NOT DETECTED   Haemophilus influenzae NOT DETECTED NOT DETECTED   Neisseria meningitidis NOT DETECTED NOT  DETECTED   Pseudomonas aeruginosa NOT DETECTED NOT DETECTED   Stenotrophomonas maltophilia NOT DETECTED NOT DETECTED   Candida albicans NOT DETECTED NOT DETECTED   Candida auris NOT DETECTED NOT DETECTED   Candida glabrata NOT DETECTED NOT DETECTED   Candida krusei NOT DETECTED NOT DETECTED   Candida parapsilosis NOT DETECTED NOT DETECTED   Candida tropicalis NOT DETECTED NOT DETECTED   Cryptococcus neoformans/gattii NOT DETECTED NOT DETECTED   CTX-M ESBL NOT DETECTED NOT DETECTED   Carbapenem resistance IMP NOT DETECTED NOT DETECTED   Carbapenem resistance KPC NOT DETECTED NOT DETECTED   Carbapenem resistance NDM NOT DETECTED NOT DETECTED   Carbapenem resist OXA 48 LIKE NOT DETECTED NOT DETECTED   Carbapenem resistance VIM NOT DETECTED NOT DETECTED    [name] A 11/22/2023  7:32 PM

## 2023-11-22 NOTE — Assessment & Plan Note (Signed)
Stable without increased O2 requirements. Goal >88-90%

## 2023-11-22 NOTE — Progress Notes (Signed)
 Patient is transferred from [facility] to 5E at 28. Alert and oriented x 4. Pain is complained in penis. Foley catheter was changed by caregiver before coming to [facility] ED, blood on the tip of penis. Paged MD admission on call at the same time and waiting for new orders.

## 2023-11-22 NOTE — Progress Notes (Signed)
 Same Day non-billable progress note   Patient is a 78 y/o M with afib on eliquis, BPH chronic indwelling foley catheter, HFpEF, pulmonary HTN, Chronic hypoxic respiratory failure on chronic supplemental oxygen, CKDIIIb, HTN, T2DM, OSA on bipap at bedtime, transferred here from [facility] earlier this morning for treatment of complicated UTI.   On my exam patient complained of buttock and right leg pain which improves with repositioning. He denies any known bedsores or recent trauma. States he lives at home with family members and ambulates with cane.   Still febrile this morning, leukocytosis down trending. Foley catheter exchanged in the ED prior to transfer, draining clear yellow urine with blood around the urethral meatus.  CT abdomen/pelvis is pending.     Complicated UTI, catheter associated (catheter present on admission) Sepsis secondary to above, present on admission Hematuria  BPH with chronic indwelling foley catheter  - cefepime 2g IV (renally dosed) pending urine and blood cultures. No piror history MDRs  - foley catheter exchanged in the ED - f/u CT abdomen/pelvis  - bp soft but stable, hold additional IVF given hx HFpEF - monitor Hgb. Continue eliquis for now  - hold tamsulosin for now given borderline blood pressures,  patient with foley   HFpEF Pulmonary HTN  AFIB, chronic  HTN HLD - Appears slightly volume overloaded clinically but at his baseline O2.  -Holding metoprolol this am given marginal blood pressures. Resume this evening if BP tolerates.  -Resume home diuretics  - continue eliquis  - continue statin   Chronic hypoxic respiratory failure  OSA on CPAP at bedtime  - continue baseline supplemental O2 and bipap   T2DM  - SSI while hospitalized. May need to reconsider outpatient medications given renal function   CKDIIIb- stable at baseline. Avoid nephrotoxic agents, renally dose medications    Eliquis No iVF  Consistent carb diet  Monitor/replace  electrolytes  Full code  Dispo: Lives at home with family.

## 2023-11-22 NOTE — TOC Initial Note (Signed)
 Transition of Care [facility]) - Initial/Assessment Note    Patient Details  Name: [name] [name] MRN: [id number] Date of Birth: 02-20-1946  Transition of Care [facility]) CM/SW Contact:    Otelia Santee, LCSW Phone Number: 11/22/2023, 3:03 PM  Clinical Narrative:                 Pt from home with daughter. Pt active with [facility] for [facility] who comes out monthly to change catheter. [facility] order will need to be placed prior to discharge. Pt is on 3L of O2 at baseline provided by [facility].  Pt's daugher to bring travel tank of O2 to hospital for transportation home. Pt has RW, Bipap, hospital bed, BSC and shower seat at home. No further DME needs identified. TOC will continue to follow.   Expected Discharge Plan: Home w Home Health Services Barriers to Discharge: No Barriers Identified   Patient Goals and CMS Choice Patient states their goals for this hospitalization and ongoing recovery are:: To return home CMS Medicare.gov Compare Post Acute Care list provided to:: Patient Choice offered to / list presented to : Patient [place] ownership interest in [facility].provided to::  (NA)    Expected Discharge Plan and Services In-house Referral: Clinical Social Work Discharge Planning Services: NA Post Acute Care Choice: Resumption of Svcs/PTA Provider Living arrangements for the past 2 months: Single Family Home                 DME Arranged: N/A DME Agency: NA                  Prior Living Arrangements/Services Living arrangements for the past 2 months: Single Family Home Lives with:: Adult Children Patient language and need for interpreter reviewed:: Yes Do you feel safe going back to the place where you live?: Yes      Need for Family Participation in Patient Care: Yes (Comment) Care giver support system in place?: Yes (comment) Current home services: DME, Home RN (hospital bed, bipap, home oxygen 3 liters with [employer], w/chair, walker, BSC, shower chair; HHRN w/  [facility]) Criminal Activity/Legal Involvement Pertinent to Current Situation/Hospitalization: No - Comment as needed  Activities of Daily Living   ADL Screening (condition at time of admission) Independently performs ADLs?: Yes (appropriate for developmental age) Is the patient deaf or have difficulty hearing?: No Does the patient have difficulty seeing, even when wearing glasses/contacts?: No Does the patient have difficulty concentrating, remembering, or making decisions?: No  Permission Sought/Granted Permission sought to share information with : Family Supports Permission granted to share information with : Yes, Verbal Permission Granted  Share Information with NAME: Allen Derry  Permission granted to share info w AGENCY: [facility]; [facility]  Permission granted to share info w Relationship: Daughter     Emotional Assessment Appearance:: Appears stated age Attitude/Demeanor/Rapport: Engaged Affect (typically observed): Pleasant Orientation: : Oriented to Self, Oriented to Place, Oriented to  Time, Oriented to Situation Alcohol / Substance Use: Not Applicable Psych Involvement: No (comment)  Admission diagnosis:  Complicated UTI (urinary tract infection) [N39.0] Urinary tract infection associated with indwelling urethral catheter, initial encounter (HCC) [N82.956O, N39.0] Sepsis without acute organ dysfunction, due to unspecified organism [facility]) [A41.9] Patient Active Problem List   Diagnosis Date Noted   Urinary retention 04/14/2023   History of UTI 04/14/2023   Recurrent UTI 03/28/2023   Chronic indwelling Foley catheter 03/28/2023   Pressure ulcer 02/02/2023   Class 2 obesity 02/02/2023   Acute kidney injury  superimposed on chronic kidney disease (HCC) 02/02/2023   Acute on chronic diastolic CHF (congestive heart failure) (HCC) 01/31/2023   Dyslipidemia 01/31/2023   Type 2 diabetes mellitus with hyperlipidemia (HCC) 01/31/2023   BPH (benign prostatic hyperplasia) 01/31/2023    Bacteremia 01/15/2023   Complicated UTI (urinary tract infection) 01/13/2023   Sepsis secondary to UTI (HCC) 01/13/2023   Closed fracture of eleventh thoracic vertebra (HCC) 11/23/2022   Closed L2 vertebral fracture (HCC) 11/18/2022   Atrial fibrillation, chronic (HCC) 11/18/2022   L1 vertebral fracture (HCC) 11/18/2022   Subacute cough 10/10/2022   Fatigue 10/08/2022   Chronic respiratory failure with hypoxia and hypercapnia related to obesity hypoventilation syndrome 03/19/2022   Chronic diastolic CHF (congestive heart failure) (HCC) 03/19/2022   Actinic keratosis 11/27/2021   Sebaceous cyst 11/27/2021   Urinary frequency 10/17/2021   Left arm swelling 01/18/2021   Vitamin D deficiency 10/10/2020   B12 deficiency 10/10/2020   Lower extremity weakness 09/12/2020   Lesion of right ear 04/13/2020   Arthritis of right acromioclavicular joint 04/02/2020   Right rotator cuff tear 04/02/2020   Greater trochanteric bursitis, right 08/14/2019   Tick bite, infected 12/19/2018   Degenerative arthritis of right knee 02/21/2017   Right knee pain 02/15/2017   Volume overload 12/23/2015   OSA (obstructive sleep apnea) 12/23/2015   Pulmonary hypertension (HCC) 12/23/2015   Gout 12/23/2015   Allergic rhinitis 11/21/2013   DOE (dyspnea on exertion) 12/20/2012   Left knee pain 07/11/2012   Increased prostate specific antigen (PSA) velocity 01/06/2012   Encounter for well adult exam with abnormal findings 12/25/2010   Long term (current) use of anticoagulants 11/06/2010   HEMATOCHEZIA 06/26/2010   BRONCHITIS, CHRONIC 01/02/2009   Obesity hypoventilation syndrome (HCC) 11/26/2008   Permanent atrial fibrillation 10/16/2008   PERIPHERAL EDEMA 01/05/2008   Non-insulin dependent type 2 diabetes mellitus (HCC) 06/09/2007   Migraine without aura 06/09/2007   Hearing loss 06/09/2007   PROSTATE SPECIFIC ANTIGEN, ELEVATED 06/09/2007   Hyperlipidemia 02/18/2007   Severe obesity (BMI >= 40) (HCC)  02/18/2007   Essential hypertension 02/18/2007   PCP:  Corwin Levins, MD Pharmacy:   [facility] [phone] - [place], [place] - [address] [facility] [address] [place] [address] Phone: [phone] Fax: [phone]  [facility] - [place], [place] - [address] [place] [address] Phone: [phone] Fax: [phone]     Social Drivers of Health (SDOH) Social History: SDOH Screenings   Food Insecurity: No Food Insecurity (11/22/2023)  Housing: High Risk (11/22/2023)  Transportation Needs: No Transportation Needs (11/22/2023)  Utilities: Not At Risk (11/22/2023)  Alcohol Screen: Low Risk  (02/11/2023)  Depression (PHQ2-9): Low Risk  (07/01/2023)  Financial Resource Strain: Low Risk  (03/24/2023)  Physical Activity: Unknown (03/24/2023)  Social Connections: Unknown (11/22/2023)  Stress: No Stress Concern Present (07/01/2023)  Tobacco Use: Medium Risk (11/22/2023)   SDOH Interventions: Food Insecurity Interventions: Intervention Not Indicated Housing Interventions: Inpatient TOC, Intervention Not Indicated Transportation Interventions: Intervention Not Indicated Utilities Interventions: Intervention Not Indicated Social Connections Interventions: Intervention Not Indicated   Readmission Risk Interventions    02/15/2023    2:47 PM 01/14/2023    1:35 PM  Readmission Risk Prevention Plan  Transportation Screening Complete Complete  PCP or Specialist Appt within 3-5 Days Complete   HRI or Home Care Consult Complete   Social Work Consult for Recovery Care Planning/Counseling  Complete   Palliative Care Screening Complete   Medication Review (RN Care Manager) Complete Complete  PCP or Specialist appointment within 3-5 days of discharge  Complete  HRI or Home Care Consult  Complete  SW Recovery Care/Counseling Consult  --  Palliative Care Screening  Not Applicable  Skilled Nursing  Facility  Not Applicable

## 2023-11-22 NOTE — Evaluation (Signed)
 Physical Therapy Evaluation Patient Details Name: JABIN TAPP MRN: [id number] DOB: [birth year] Today's Date: 11/22/2023  History of Present Illness  Yutaka D Alphin is a 78 y.o. male with hx of chronic BPH with indwelling Foley catheter, additional history of HFpEF, chronic A-fib on AC, pulmonary hypertension, OHS/OSA, CHHRF on 3L O2, BiPAP at night, CKD stage IIIb, hypertension, hyperlipidemia, diabetes, who was transferred from [facility] ED due to complicated UTI  Clinical Impression  Pt admitted with above diagnosis. Pt agreeable to assess current function, he reports inc pain in RLE from hip to toes which inc with bed mobility, inc groin pain with sitting EOB. Pt able to stand with CGA from bed, able to maintain balance in standing with assist from 2WW for safety, defers distance mobility towards hallway or bathroom, takes wide arc to the chair which covered about 4-5 feet of distance, pt fatigued but reports feeing better sitting up. Pt currently with functional limitations due to the deficits listed below (see PT Problem List). Pt will benefit from acute skilled PT to increase their independence and safety with mobility to allow discharge.           If plan is discharge home, recommend the following: A little help with walking and/or transfers;A little help with bathing/dressing/bathroom;Assistance with cooking/housework;Assist for transportation;Help with stairs or ramp for entrance   Can travel by private vehicle        Equipment Recommendations Rolling walker (2 wheels)  Recommendations for Other Services       Functional Status Assessment Patient has had a recent decline in their functional status and demonstrates the ability to make significant improvements in function in a reasonable and predictable amount of time.     Precautions / Restrictions Precautions Precautions: Fall Recall of Precautions/Restrictions: Intact Restrictions Weight Bearing Restrictions Per Provider Order: No       Mobility  Bed Mobility Overal bed mobility: Needs Assistance Bed Mobility: Supine to Sit     Supine to sit: [place] assist     General bed mobility comments: requires significant help due to groin pain, assisted in scooting but once hand holds in place, pt tolerates better and is able to get feet on floor    Transfers Overall transfer level: Needs assistance Equipment used: Rolling walker (2 wheels) Transfers: Sit to/from Stand, Bed to chair/wheelchair/BSC Sit to Stand: Contact guard assist   Step pivot transfers: Contact guard assist       General transfer comment: Pt completes wider arc of movement incorporating about 4-5 feet of distance to reach chair with turn. slow, but steady, defers distance amb due to fatigue.    Ambulation/Gait                  Stairs            Wheelchair Mobility     Tilt Bed    Modified Rankin (Stroke Patients Only)       Balance Overall balance assessment: Needs assistance Sitting-balance support: Feet supported, No upper extremity supported Sitting balance-[name] Scale: Fair     Standing balance support: During functional activity, No upper extremity supported Standing balance-[name] Scale: Fair                               Pertinent Vitals/Pain Pain Assessment Pain Assessment: 0-10 Pain Score: 4  Pain Location: RLE Pain Descriptors / Indicators: Aching, Constant, Discomfort, Grimacing Pain Intervention(s): Limited activity within patient's tolerance, Monitored during  session, Repositioned    Home Living Family/patient expects to be discharged to:: Private residence Living Arrangements: Children Available Help at Discharge: Family;Available 24 hours/day Type of Home: House Home Access: Ramped entrance       Home Layout: One level Home Equipment: Toilet riser;Tub bench;Rollator (4 wheels);Wheelchair - manual Additional Comments: Pt uses 4WW as primary AD, does not complete community  participations    Prior Function Prior Level of Function : Needs assist       Physical Assist : Mobility (physical) Mobility (physical): Gait   Mobility Comments: able to get around house okay, higher level tasks, will have help       Extremity/Trunk Assessment        Lower Extremity Assessment Lower Extremity Assessment: Generalized weakness    Cervical / Trunk Assessment Cervical / Trunk Assessment: Kyphotic  Communication   Communication Communication: No apparent difficulties    Cognition Arousal: Alert Behavior During Therapy: WFL for tasks assessed/performed   PT - Cognitive impairments: No apparent impairments                         Following commands: Intact       Cueing Cueing Techniques: Verbal cues, Gestural cues, Visual cues     General Comments General comments (skin integrity, edema, etc.): inc groin pain, RLE pain dec with position changes, 3/10 in standing, 2/10 in sitting    Exercises     Assessment/Plan    PT Assessment Patient needs continued PT services  PT Problem List Decreased strength;Decreased activity tolerance;Decreased mobility;Decreased balance       PT Treatment Interventions DME instruction;Functional mobility training;Balance training;Patient/family education;Gait training;Therapeutic activities;Therapeutic exercise    PT Goals (Current goals can be found in the Care Plan section)  Acute Rehab PT Goals Patient Stated Goal: improve activity tolerance and return home PT Goal Formulation: With patient Time For Goal Achievement: 12/06/23 Potential to Achieve Goals: Good    Frequency Min 3X/week     Co-evaluation               AM-PAC PT "6 Clicks" Mobility  Outcome Measure Help needed turning from your back to your side while in a flat bed without using bedrails?: A Lot Help needed moving from lying on your back to sitting on the side of a flat bed without using bedrails?: A Lot Help needed moving to  and from a bed to a chair (including a wheelchair)?: A Little Help needed standing up from a chair using your arms (e.g., wheelchair or bedside chair)?: A Little Help needed to walk in hospital room?: A Little Help needed climbing 3-5 steps with a railing? : Total 6 Click Score: 14    End of Session Equipment Utilized During Treatment: Gait belt;Oxygen (3L via [place]) Activity Tolerance: Patient limited by fatigue Patient left: in chair;with call bell/phone within reach;with chair alarm set;with family/visitor present Nurse Communication: Mobility status PT Visit Diagnosis: Unsteadiness on feet (R26.81);Difficulty in walking, not elsewhere classified (R26.2);Muscle weakness (generalized) (M62.81);Pain    Time: 1610-9604 PT Time Calculation (min) (ACUTE ONLY): 29 min   Charges:   PT Evaluation $PT Eval Low Complexity: 1 Low PT Treatments $Therapeutic Activity: 8-22 mins PT General Charges $$ ACUTE PT VISIT: 1 Visit         Madaline Guthrie, PT Acute Rehabilitation Services Office: [phone] 11/22/2023   Evelena Peat 11/22/2023, 5:10 PM

## 2023-11-22 NOTE — H&P (Addendum)
 History and Physical    [name] [name] NGE:[id number] DOB: 02-01-1946 DOA: 11/21/2023  PCP: Roslyn Coombe, MD   Patient coming from: [facility] ED   Chief Complaint: hematuria / penile pain    HPI:  [name] [name] is a 78 y.o. male with hx of chronic BPH with indwelling Foley catheter, additional history of HFpEF, chronic A-fib on AC, pulmonary hypertension, OHS/OSA, CHHRF on 3L O2, BiPAP at night, CKD stage IIIb, hypertension, hyperlipidemia, diabetes, who was transferred from [facility] ED due to complicated UTI.  Reportedly his catheter was exchanged earlier today by home health nurse. When they inflated the balloon he had immediate pain. Reports they deflated + adjusted a few times, but still having pain. Noted bleeding into the catheter as well as some bleeding around the catheter site. Otherwise reports chills. No fever. Denies any other recent illness.     Review of Systems:  ROS complete and negative except as marked above   Allergies  Allergen Reactions   Codeine     Altered mental status "goofy"   Heparin Rash     Abdominal rash 01/2013    Prior to Admission medications   Medication Sig Start Date End Date Taking? Authorizing Provider  acetaminophen (TYLENOL) 500 MG tablet Take 500-1,000 mg by mouth every 6 (six) hours as needed for moderate pain (pain score 4-6).    [provider]  allopurinol (ZYLOPRIM) 100 MG tablet Take 1 tablet (100 mg total) by mouth daily. 05/18/23   Roslyn Coombe, MD  ammonium lactate (LAC-HYDRIN) 12 % lotion Apply topically. 02/20/23   [provider]  apixaban (ELIQUIS) 5 MG TABS tablet Take 1 tablet (5 mg total) by mouth 2 (two) times daily. 08/17/23   Lenise Quince, MD  atorvastatin (LIPITOR) 20 MG tablet Take 1 tablet (20 mg total) by mouth daily. 06/14/23   Roslyn Coombe, MD  cholecalciferol (VITAMIN D3) 25 MCG (1000 UNIT) tablet Take 2,000 Units by mouth daily.    [provider]  cyanocobalamin (VITAMIN B12) 1000 MCG/ML injection  Inject 1 mL (1,000 mcg total) into the muscle every 30 (thirty) days. 07/01/23   Roslyn Coombe, MD  glipiZIDE (GLUCOTROL XL) 10 MG 24 hr tablet TAKE 1 TABLET(10 MG) BY MOUTH DAILY WITH BREAKFAST 08/17/23   Roslyn Coombe, MD  Lancets MISC Use as directed once daily  E11.9 04/19/17   Roslyn Coombe, MD  metoprolol tartrate (LOPRESSOR) 25 MG tablet TAKE 1 TABLET(25 MG) BY MOUTH TWICE DAILY 08/17/23   Roslyn Coombe, MD  nystatin (MYCOSTATIN/NYSTOP) powder Apply 1 Application topically 2 (two) times daily. 09/09/23   Stoneking, Ponce Brisker., MD  nystatin cream (MYCOSTATIN) Apply 1 Application topically 2 (two) times daily. 09/09/23   Stoneking, Ponce Brisker., MD  OXYGEN Inhale 3-5 L into the lungs daily. 3L Daily  4L Resting  5L Exertion    [provider]  polyethylene glycol (MIRALAX / GLYCOLAX) 17 g packet Take 17 g by mouth daily as needed. 02/10/23   Deforest Fast, MD  spironolactone (ALDACTONE) 25 MG tablet Take 1 tablet (25 mg total) by mouth daily. 05/13/23   Lenise Quince, MD  tamsulosin (FLOMAX) 0.4 MG CAPS capsule TAKE 1 CAPSULE BY MOUTH EVERY DAY AFTER SUPPER 11/09/23   Roslyn Coombe, MD  torsemide (DEMADEX) 20 MG tablet Take 1 tablet (20 mg total) by mouth daily. 02/11/23   Deforest Fast, MD    Past Medical History:  Diagnosis Date   Acute right-sided  CHF (congestive heart failure) (HCC)    a. 01/2013.   Arthritis    "knees and hands; thoracic area of the spine" (01/05/2013)   BRONCHITIS, CHRONIC 01/02/2009   COMMON MIGRAINE    "none since treating for high BP"   Complication of anesthesia    "aspiration pneumonia after hand OR" (01/05/2013)   DIABETES MELLITUS, TYPE II    GOUT    HYPERLIPIDEMIA    HYPERTENSION    Long term (current) use of anticoagulants    Morbid obesity (HCC)    OBESITY HYPOVENTILATION SYNDROME    a. on home O2.   On home oxygen therapy    OSA (obstructive sleep apnea)    Permanent atrial fibrillation (HCC)    PROSTATE SPECIFIC ANTIGEN, ELEVATED 06/09/2007    Pulmonary HTN (HCC)    a. multifactorial including obstructive sleep apnea, obesity hypoventilation syndrome and possible pulmonary venous hypertension.   SKIN RASH 03/12/2010   Unspecified hearing loss     Past Surgical History:  Procedure Laterality Date   APPENDECTOMY  1974   APPLICATION OF ROBOTIC ASSISTANCE FOR SPINAL PROCEDURE N/A 11/23/2022   Procedure: APPLICATION OF ROBOTIC ASSISTANCE FOR SPINAL PROCEDURE;  Surgeon: Lisbeth Renshaw, MD;  Location: [facility] OR;  Service: Neurosurgery;  Laterality: N/A;   CARDIOVERSION  05/21/2008; ~ 06/2008   HERNIA REPAIR     INGUINAL HERNIA REPAIR Right    RIGHT HEART CATHETERIZATION N/A 01/08/2013   Procedure: RIGHT HEART CATH;  Surgeon: Vesta Mixer, MD;  Location: [facility] CATH LAB;  Service: Cardiovascular;  Laterality: N/A;   TENDON REPAIR Right 2009   "lacerated his tendon and pulley" Dr. Izora Ribas   UMBILICAL HERNIA REPAIR       reports that he quit smoking about 45 years ago. His smoking use included cigarettes. He started smoking about 60 years ago. He has a 15 pack-year smoking history. He has never used smokeless tobacco. He reports current alcohol use. He reports that he does not use drugs.  Family History  Problem Relation Age of Onset   Alzheimer's disease Father    Prostate cancer Father    Cancer Other        Breast Cancer, <50 yo 1st degree relative   Coronary artery disease Other        Male, 1st degree relative     Physical Exam: Vitals:   11/21/23 2230 11/21/23 2343 11/22/23 0052 11/22/23 0100  BP: 117/72 (!) 94/55 (!) 86/55   Pulse: 94 88 79   Resp: (!) 22 20 18    Temp: (!) 101.6 F (38.7 C) 98.6 F (37 C) 98.3 F (36.8 C)   TempSrc: Oral Oral    SpO2: 97% 97% 96%   Weight:    111 kg  Height:    5\' 8"  (1.727 m)    Gen: Awake, alert, chronically ill appearing.   CV: Regular, soft S1, S2, no murmurs  Resp: Normal WOB, on 3L, Coarse bilaterally.   Abd: Obese, normoactive, nontender GU: foley draining clear straw  colored urine. The urethral meatus has frank blood around the catheter, currently hemostatic.  MSK: Symmetric, venous stasis changes, trace edema.  Skin: No rashes or lesions to exposed skin  Neuro: Alert and interactive  Psych: euthymic, appropriate    Data review:   Labs reviewed, notable for:   Lactate 1.6 -> 1.4 Creatinine 1.97 near baseline WBC 14, hemoglobin 12 INR 1.3 UA grossly consistent with infection, hematuria   Micro:  Results for orders placed or performed during the hospital  encounter of 11/21/23  Resp panel by RT-PCR (RSV, Flu A&B, Covid) Peripheral     Status: None   Collection Time: 11/21/23  8:33 PM   Specimen: Peripheral; Nasal Swab  Result Value Ref Range Status   SARS Coronavirus 2 by RT PCR NEGATIVE NEGATIVE Final    Comment: (NOTE) SARS-CoV-2 target nucleic acids are NOT DETECTED.  The SARS-CoV-2 RNA is generally detectable in upper respiratory specimens during the acute phase of infection. The lowest concentration of SARS-CoV-2 viral copies this assay can detect is 138 copies/mL. A negative result does not preclude SARS-Cov-2 infection and should not be used as the sole basis for treatment or other patient management decisions. A negative result may occur with  improper specimen collection/handling, submission of specimen other than nasopharyngeal swab, presence of viral mutation(s) within the areas targeted by this assay, and inadequate number of viral copies(<138 copies/mL). A negative result must be combined with clinical observations, patient history, and epidemiological information. The expected result is Negative.  Fact Sheet for Patients:  BloggerCourse.com  Fact Sheet for Healthcare Providers:  SeriousBroker.it  This test is no t yet approved or cleared by the [place] FDA and  has been authorized for detection and/or diagnosis of SARS-CoV-2 by FDA under an Emergency Use  Authorization (EUA). This EUA will remain  in effect (meaning this test can be used) for the duration of the COVID-19 declaration under Section 564(b)(1) of the Act, 21 U.S.C.section 360bbb-3(b)(1), unless the authorization is terminated  or revoked sooner.       Influenza A by PCR NEGATIVE NEGATIVE Final   Influenza B by PCR NEGATIVE NEGATIVE Final    Comment: (NOTE) The Xpert Xpress SARS-CoV-2/FLU/RSV plus assay is intended as an aid in the diagnosis of influenza from Nasopharyngeal swab specimens and should not be used as a sole basis for treatment. Nasal washings and aspirates are unacceptable for Xpert Xpress SARS-CoV-2/FLU/RSV testing.  Fact Sheet for Patients: BloggerCourse.com  Fact Sheet for Healthcare Providers: SeriousBroker.it  This test is not yet approved or cleared by the [place] FDA and has been authorized for detection and/or diagnosis of SARS-CoV-2 by FDA under an Emergency Use Authorization (EUA). This EUA will remain in effect (meaning this test can be used) for the duration of the COVID-19 declaration under Section 564(b)(1) of the Act, 21 U.S.C. section 360bbb-3(b)(1), unless the authorization is terminated or revoked.     Resp Syncytial Virus by PCR NEGATIVE NEGATIVE Final    Comment: (NOTE) Fact Sheet for Patients: BloggerCourse.com  Fact Sheet for Healthcare Providers: SeriousBroker.it  This test is not yet approved or cleared by the [place] FDA and has been authorized for detection and/or diagnosis of SARS-CoV-2 by FDA under an Emergency Use Authorization (EUA). This EUA will remain in effect (meaning this test can be used) for the duration of the COVID-19 declaration under Section 564(b)(1) of the Act, 21 U.S.C. section 360bbb-3(b)(1), unless the authorization is terminated or revoked.  Performed at [facility], [address] [place], [place] [address]     Imaging reviewed:  [facility] Chest [facility] 1 View Result Date: 11/21/2023 CLINICAL DATA:  Possible sepsis EXAM: PORTABLE CHEST 1 VIEW COMPARISON:  01/30/2023 FINDINGS: Cardiac shadow is enlarged but stable. Aortic calcifications are noted. The lungs are well aerated bilaterally. Mild left basilar atelectasis is seen. No bony abnormality is noted. IMPRESSION: Mild left basilar atelectasis is noted. Electronically Signed   By: Alcide Clever M.D.   On: 11/21/2023 21:24  ED Course:  Treated with 500 cc IV fluid, cefepime, Tylenol   Assessment/Plan:  78 y.o. male with hx chronic BPH with indwelling Foley catheter, additional history of HFpEF, chronic A-fib on AC, pulmonary hypertension, CHRF on O2, CKD stage IIIb, hypertension, hyperlipidemia, diabetes, OSA, who was transferred from [facility] ED due to complicated UTI   Complicated UTI, catheter associated (catheter present on admission) Sepsis secondary to above, present on admission After catheter exchange developed signs of worsening infection.  On ED evaluation Tmax 38.7, developed worsening hypotension after transfer to 80s over 50s.  WBC 14.  UA grossly consistent with infection and hematuria.  Likely translocation during catheter exchange.  No prior history of resistant organisms -Continue cefepime 2 g IV every 12 hours (renally dosed) -S/p 500 cc IV fluid, give additional 1 L and follow-up blood pressure trend -Follow-up blood cultures, urine culture  Hematuria ? Urethral injury   BPH with chronic indwelling Foley Develops after catheter exchange.  He is on anticoagulation with Eliquis.  Currently hemostatic, no clots in the catheter. - Flush catheter every shift with saline. - Can touch base with urology in a.m. re: any additional trend for possible urethral injury and bleeding around catheter (not contacted overnight)  -Okay to resume on anticoagulation  Chronic medical problems: HFpEF: Not on  diuretics at this time Chronic A-fib: Resume home Eliquis, metoprolol 25 mg twice daily Pulmonary hypertension: Noted OHS/ Chronic hypoxic hypercapnic history failure:  On 3L O2 , Bipap nightly CKD stage IIIb: Baseline creatinine around 1.9 Hypertension: On beta-blocker per above Hyperlipidemia: Continue home atorvastatin Diabetes type 2: Hold home glipizide.  SSI for very sensitive while inpatient.  Check A1c OSA: BiPAP nightly Gout: Continue home allopurinol  Body mass index is 37.21 kg/m.  Obesity class II would benefit from weight loss outpatient  DVT prophylaxis:  Eliquis Code Status:  Full Code Diet:  Diet Orders (From admission, onward)     Start     Ordered   11/22/23 0352  Diet Carb Modified Fluid consistency: Thin; Room service appropriate? Yes  Diet effective now       Question Answer Comment  Diet-HS Snack? Nothing   Calorie Level Medium 1600-2000   Fluid consistency: Thin   Room service appropriate? Yes      11/22/23 0353           Family Communication:  No   Consults:  none   Admission status:   Observation, Telemetry bed  Severity of Illness: The appropriate patient status for this patient is OBSERVATION. Observation status is judged to be reasonable and necessary in order to provide the required intensity of service to ensure the patient's safety. The patient's presenting symptoms, physical exam findings, and initial radiographic and laboratory data in the context of their medical condition is felt to place them at decreased risk for further clinical deterioration. Furthermore, it is anticipated that the patient will be medically stable for discharge from the hospital within 2 midnights of admission.    [name] Larch, MD [facility]  How to contact the [facility] Attending or Consulting provider 7A - 7P or covering provider during after hours 7P -7A, for this patient.  Check the care team in [facility] and look for a) attending/consulting [facility] provider listed  and b) the [facility] team listed Log into [email or web address] and use [facility] universal password to access. If you do not have the password, please contact the hospital operator. Locate the [facility] provider you are looking for under [facility] and page  to a number that you can be directly reached. If you still have difficulty reaching the provider, please page the [facility] (Director on Call) for the Hospitalists listed on amion for assistance.  11/22/2023, 3:57 AM

## 2023-11-22 NOTE — Assessment & Plan Note (Addendum)
 Severe OSA/OHS. Patient is compliant with BiPAP therapy and receives benefit from use. Excellent control. Receives benefit from use. Mask leaks noted but not affecting control or efficacy. Aware of proper care/use. Understands risks of untreated OSA. Resolving pressure ulcer to his nose from current mask. Would recommend he still switch him to DreamWear full face mask to remove pressure from this area. Will follow up with the DME on this order as well as ONO results.   Patient Instructions  Continue to use BiPAP every night with supplemental oxygen, minimum of 4-6 hours a night.  Change equipment as directed. Wash your tubing with warm soap and water daily, hang to dry. Wash humidifier portion weekly. Use bottled, distilled water and change daily Be aware of reduced alertness and do not drive or operate heavy machinery if experiencing this or drowsiness.  Exercise encouraged, as tolerated. Healthy weight management discussed.  Avoid or decrease alcohol consumption and medications that make you more sleepy, if possible. Notify if persistent daytime sleepiness occurs even with consistent use of PAP therapy.   Continue supplemental oxygen 3 lpm for goal >88-90%   Will follow up on the new BiPAP mask and the overnight oxygen order. Will call you regarding these results    Follow up in 6 months with Dr. [name] or [name].  If symptoms worsen, please contact office for sooner follow up or seek emergency care.

## 2023-11-23 DIAGNOSIS — N390 Urinary tract infection, site not specified: Secondary | ICD-10-CM | POA: Diagnosis not present

## 2023-11-23 LAB — CBC
HCT: 37.1 % — ABNORMAL LOW (ref 39.0–52.0)
Hemoglobin: 11.6 g/dL — ABNORMAL LOW (ref 13.0–17.0)
MCH: 28.9 pg (ref 26.0–34.0)
MCHC: 31.3 g/dL (ref 30.0–36.0)
MCV: 92.5 fL (ref 80.0–100.0)
Platelets: 179 10*3/uL (ref 150–400)
RBC: 4.01 MIL/uL — ABNORMAL LOW (ref 4.22–5.81)
RDW: 15.9 % — ABNORMAL HIGH (ref 11.5–15.5)
WBC: 9.3 10*3/uL (ref 4.0–10.5)
nRBC: 0 % (ref 0.0–0.2)

## 2023-11-23 LAB — GLUCOSE, CAPILLARY
Glucose-Capillary: 90 mg/dL (ref 70–99)
Glucose-Capillary: 122 mg/dL — ABNORMAL HIGH (ref 70–99)
Glucose-Capillary: 104 mg/dL — ABNORMAL HIGH (ref 70–99)
Glucose-Capillary: 134 mg/dL — ABNORMAL HIGH (ref 70–99)

## 2023-11-23 LAB — BASIC METABOLIC PANEL WITH GFR
Anion gap: 8 (ref 5–15)
BUN: 34 mg/dL — ABNORMAL HIGH (ref 8–23)
CO2: 22 mmol/L (ref 22–32)
Calcium: 8.8 mg/dL — ABNORMAL LOW (ref 8.9–10.3)
Chloride: 105 mmol/L (ref 98–111)
Creatinine, Ser: 1.91 mg/dL — ABNORMAL HIGH (ref 0.61–1.24)
GFR, Estimated: 36 mL/min — ABNORMAL LOW (ref >60)
Glucose, Bld: 100 mg/dL — ABNORMAL HIGH (ref 70–99)
Potassium: 4.4 mmol/L (ref 3.5–5.1)
Sodium: 135 mmol/L (ref 135–145)

## 2023-11-23 LAB — HEMOGLOBIN A1C
Hgb A1c MFr Bld: 6 % — ABNORMAL HIGH (ref 4.8–5.6)
Mean Plasma Glucose: 126 mg/dL

## 2023-11-23 NOTE — Progress Notes (Signed)
 PROGRESS NOTE    [name] [name]  BJY:[id number] DOB: 10/04/1945 DOA: 11/21/2023 PCP: Roslyn Coombe, MD   Brief Narrative:  78 y.o. male with hx of chronic BPH with indwelling Foley catheter, additional history of HFpEF, chronic A-fib on AC, pulmonary hypertension, OHS/OSA, CHHRF on 3L O2, BiPAP at night, CKD stage IIIb, hypertension, hyperlipidemia, diabetes, admitted with hematuria and urethral pain With chills.  Is followed by home health for changing his chronic Foley catheter once a month.      Assessment & Plan:   Principal Problem:   Complicated UTI (urinary tract infection) Active Problems:   Sepsis secondary to UTI (HCC)  Proteus bacteremia- Complicated UTI, catheter associated (catheter present on admission) Sepsis secondary to above, present on admission Admitted with hematuria and dysuria, in the ER he was hypotensive with blood pressure 80 over 50s with leukocytosis and UA consistent with urinary tract infection.  He was initially started on cefepime then changed to Rocephin as his blood culture came back positive for Proteus.  Blood pressure improved with IV fluids. Await final urine and blood culture and sensitivities.   Hematuria ? Urethral injury   BPH with chronic indwelling Foley-patient developed hematuria after catheter exchange in the setting of Eliquis.  Currently his urine has been clear with no clots. HFpEF: Not on diuretics at this time Chronic A-fib: Resume home Eliquis, metoprolol 25 mg twice daily Pulmonary hypertension: Noted OHS/ Chronic hypoxic hypercapnic history failure:  On 3L O2 , Bipap nightly CKD stage IIIb: Baseline creatinine around 1.9 Hypertension: On beta-blocker per above Hyperlipidemia: Continue home atorvastatin Diabetes type 2: Hold home glipizide.  SSI for very sensitive while inpatient.  Check A1c OSA: BiPAP nightly Gout: Continue home allopurinol  Pressure Injury 11/22/23 Buttocks Left Stage 1 -  Intact skin with non-blanchable redness of  a localized area usually over a bony prominence. unblanchable, redness (Active)  11/22/23 0102  Location: Buttocks  Location Orientation: Left  Staging: Stage 1 -  Intact skin with non-blanchable redness of a localized area usually over a bony prominence.  Wound Description (Comments): unblanchable, redness  Present on Admission: Yes  Dressing Type Foam - Lift dressing to assess site every shift 11/22/23 2140    Estimated body mass index is 37.21 kg/m as calculated from the following:   Height as of this encounter: 5\' 8"  (1.727 m).   Weight as of this encounter: 111 kg.  DVT prophylaxis: ELIQUIS Code Status: full Family Communication: none Disposition Plan:  Status is: Inpatient Remains inpatient appropriate because: acute illness   Consultants:  none  Procedures: none Antimicrobials:rocephin  Subjective: Resting in bed  Foley clear urine  Objective: Vitals:   11/22/23 1921 11/22/23 2155 11/23/23 0503 11/23/23 1004  BP: (!) 100/58 93/68 114/63 109/73  Pulse: 65 96 94 88  Resp: 18  20   Temp: 98.5 F (36.9 C)  98.3 F (36.8 C)   TempSrc: Oral  Oral   SpO2: 100%  95%   Weight:      Height:        Intake/Output Summary (Last 24 hours) at 11/23/2023 1310 Last data filed at 11/23/2023 1000 Gross per 24 hour  Intake 360 ml  Output 2550 ml  Net -2190 ml   Filed Weights   11/22/23 0100  Weight: 111 kg    Examination:  General exam: Appears chronically ill appearing  Respiratory system: Clear to auscultation. Respiratory effort normal. Cardiovascular system:.irreg Gastrointestinal system: Abdomen is nondistended, soft and nontender. No organomegaly or masses  felt. Normal bowel sounds heard. Central nervous system: Alert and oriented. No focal neurological deficits. Extremities: no edema    Data Reviewed: I have personally reviewed following labs and imaging studies  CBC: Recent Labs  Lab 11/21/23 2033 11/22/23 0535 11/23/23 0530  WBC 14.8* 13.8* 9.3   NEUTROABS 12.5*  --   --   HGB 12.9* 11.6* 11.6*  HCT 39.4 37.4* 37.1*  MCV 88.7 92.6 92.5  PLT 221 173 179   Basic Metabolic Panel: Recent Labs  Lab 11/21/23 2033 11/22/23 0535 11/23/23 0530  NA 140 141 135  K 5.0 4.3 4.4  CL 105 110 105  CO2 26 22 22   GLUCOSE 153* 103* 100*  BUN 45* 43* 34*  CREATININE 1.97* 1.91* 1.91*  CALCIUM 9.8 8.9 8.8*  MG  --  1.8  --   PHOS  --  3.1  --    GFR: Estimated Creatinine Clearance: 39.1 mL/min (A) (by C-G formula based on SCr of 1.91 mg/dL (H)). Liver Function Tests: Recent Labs  Lab 11/21/23 2033  AST 15  ALT 12  ALKPHOS 64  BILITOT 1.3*  PROT 7.3  ALBUMIN 3.7   No results for input(s): "LIPASE", "AMYLASE" in the last 168 hours. No results for input(s): "AMMONIA" in the last 168 hours. Coagulation Profile: Recent Labs  Lab 11/21/23 2033  INR 1.3*   Cardiac Enzymes: No results for input(s): "CKTOTAL", "CKMB", "CKMBINDEX", "TROPONINI" in the last 168 hours. BNP (last 3 results) Recent Labs    03/24/23 1144  PROBNP 63.0   HbA1C: Recent Labs    11/22/23 1209  HGBA1C 6.0*   CBG: Recent Labs  Lab 11/22/23 1143 11/22/23 1721 11/22/23 2026 11/23/23 0718 11/23/23 1123  GLUCAP 128* 106* 138* 90 122*   Lipid Profile: No results for input(s): "CHOL", "HDL", "LDLCALC", "TRIG", "CHOLHDL", "LDLDIRECT" in the last 72 hours. Thyroid Function Tests: No results for input(s): "TSH", "T4TOTAL", "FREET4", "T3FREE", "THYROIDAB" in the last 72 hours. Anemia Panel: No results for input(s): "VITAMINB12", "FOLATE", "FERRITIN", "TIBC", "IRON", "RETICCTPCT" in the last 72 hours. Sepsis Labs: Recent Labs  Lab 11/21/23 2033 11/22/23 0106  LATICACIDVEN 1.6 1.4    Recent Results (from the past 240 hours)  Blood Culture (routine x 2)     Status: Abnormal (Preliminary result)   Collection Time: 11/21/23  8:32 PM   Specimen: BLOOD RIGHT HAND  Result Value Ref Range Status   Specimen Description   Final    BLOOD RIGHT  HAND Performed at [facility], [address], [place], [place] [address]    Special Requests   Final    BOTTLES DRAWN AEROBIC AND ANAEROBIC Blood Culture adequate volume Performed at [facility], [address] [place], [place] [address]    Culture  Setup Time   Final    GRAM NEGATIVE RODS IN BOTH AEROBIC AND ANAEROBIC BOTTLES CRITICAL RESULT CALLED TO, READ BACK BY AND VERIFIED WITH: PHARMD DREW WOFFORD ON 11/22/23 @ 1932 BY DRT    Culture (A)  Final    PROTEUS MIRABILIS SUSCEPTIBILITIES TO FOLLOW Performed at [facility], [address] [place], [place] [address]    Report Status PENDING  Incomplete  Blood Culture ID Panel (Reflexed)     Status: Abnormal   Collection Time: 11/21/23  8:32 PM  Result Value Ref Range Status   Enterococcus faecalis NOT DETECTED NOT DETECTED Final   Enterococcus Faecium NOT DETECTED NOT DETECTED Final   Listeria monocytogenes NOT DETECTED NOT DETECTED Final  Staphylococcus species NOT DETECTED NOT DETECTED Final   Staphylococcus aureus (BCID) NOT DETECTED NOT DETECTED Final   Staphylococcus epidermidis NOT DETECTED NOT DETECTED Final   Staphylococcus lugdunensis NOT DETECTED NOT DETECTED Final   Streptococcus species NOT DETECTED NOT DETECTED Final   Streptococcus agalactiae NOT DETECTED NOT DETECTED Final   Streptococcus pneumoniae NOT DETECTED NOT DETECTED Final   Streptococcus pyogenes NOT DETECTED NOT DETECTED Final   A.calcoaceticus-baumannii NOT DETECTED NOT DETECTED Final   Bacteroides fragilis NOT DETECTED NOT DETECTED Final   Enterobacterales DETECTED (A) NOT DETECTED Final    Comment: Enterobacterales represent a large order of gram negative bacteria, not a single organism. CRITICAL RESULT CALLED TO, READ BACK BY AND VERIFIED WITH: PHARMD DREW WOFFORD ON 11/22/23 @ 1932 BY DRT    Enterobacter cloacae complex NOT DETECTED NOT DETECTED Final   Escherichia coli NOT DETECTED NOT DETECTED  Final   Klebsiella aerogenes NOT DETECTED NOT DETECTED Final   Klebsiella oxytoca NOT DETECTED NOT DETECTED Final   Klebsiella pneumoniae NOT DETECTED NOT DETECTED Final   Proteus species DETECTED (A) NOT DETECTED Final    Comment: CRITICAL RESULT CALLED TO, READ BACK BY AND VERIFIED WITH: PHARMD DREW WOFFORD ON 11/22/23 @ 1932 BY DRT    Salmonella species NOT DETECTED NOT DETECTED Final   Serratia marcescens NOT DETECTED NOT DETECTED Final   Haemophilus influenzae NOT DETECTED NOT DETECTED Final   Neisseria meningitidis NOT DETECTED NOT DETECTED Final   Pseudomonas aeruginosa NOT DETECTED NOT DETECTED Final   Stenotrophomonas maltophilia NOT DETECTED NOT DETECTED Final   Candida albicans NOT DETECTED NOT DETECTED Final   Candida auris NOT DETECTED NOT DETECTED Final   Candida glabrata NOT DETECTED NOT DETECTED Final   Candida krusei NOT DETECTED NOT DETECTED Final   Candida parapsilosis NOT DETECTED NOT DETECTED Final   Candida tropicalis NOT DETECTED NOT DETECTED Final   Cryptococcus neoformans/gattii NOT DETECTED NOT DETECTED Final   CTX-M ESBL NOT DETECTED NOT DETECTED Final   Carbapenem resistance IMP NOT DETECTED NOT DETECTED Final   Carbapenem resistance KPC NOT DETECTED NOT DETECTED Final   Carbapenem resistance NDM NOT DETECTED NOT DETECTED Final   Carbapenem resist OXA 48 LIKE NOT DETECTED NOT DETECTED Final   Carbapenem resistance VIM NOT DETECTED NOT DETECTED Final    Comment: Performed at [facility], [address] [place], [place] [address]  Resp panel by RT-PCR (RSV, Flu A&B, Covid) Peripheral     Status: None   Collection Time: 11/21/23  8:33 PM   Specimen: Peripheral; Nasal Swab  Result Value Ref Range Status   SARS Coronavirus 2 by RT PCR NEGATIVE NEGATIVE Final    Comment: (NOTE) SARS-CoV-2 target nucleic acids are NOT DETECTED.  The SARS-CoV-2 RNA is generally detectable in upper respiratory specimens during the acute phase of infection. The  lowest concentration of SARS-CoV-2 viral copies this assay can detect is 138 copies/mL. A negative result does not preclude SARS-Cov-2 infection and should not be used as the sole basis for treatment or other patient management decisions. A negative result may occur with  improper specimen collection/handling, submission of specimen other than nasopharyngeal swab, presence of viral mutation(s) within the areas targeted by this assay, and inadequate number of viral copies(<138 copies/mL). A negative result must be combined with clinical observations, patient history, and epidemiological information. The expected result is Negative.  Fact Sheet for Patients:  BloggerCourse.com  Fact Sheet for Healthcare Providers:  SeriousBroker.it  This test is no t yet approved  or cleared by the [place]  FDA and  has been authorized for detection and/or diagnosis of SARS-CoV-2 by FDA under an Emergency Use Authorization (EUA). This EUA will remain  in effect (meaning this test can be used) for the duration of the COVID-19 declaration under Section 564(b)(1) of the Act, 21 U.S.C.section 360bbb-3(b)(1), unless the authorization is terminated  or revoked sooner.       Influenza A by PCR NEGATIVE NEGATIVE Final   Influenza B by PCR NEGATIVE NEGATIVE Final    Comment: (NOTE) The Xpert Xpress SARS-CoV-2/FLU/RSV plus assay is intended as an aid in the diagnosis of influenza from Nasopharyngeal swab specimens and should not be used as a sole basis for treatment. Nasal washings and aspirates are unacceptable for Xpert Xpress SARS-CoV-2/FLU/RSV testing.  Fact Sheet for Patients: BloggerCourse.com  Fact Sheet for Healthcare Providers: SeriousBroker.it  This test is not yet approved or cleared by the [place]  FDA and has been authorized for detection and/or diagnosis of SARS-CoV-2 by FDA under  an Emergency Use Authorization (EUA). This EUA will remain in effect (meaning this test can be used) for the duration of the COVID-19 declaration under Section 564(b)(1) of the Act, 21 U.S.C. section 360bbb-3(b)(1), unless the authorization is terminated or revoked.     Resp Syncytial Virus by PCR NEGATIVE NEGATIVE Final    Comment: (NOTE) Fact Sheet for Patients: BloggerCourse.com  Fact Sheet for Healthcare Providers: SeriousBroker.it  This test is not yet approved or cleared by the [place]  FDA and has been authorized for detection and/or diagnosis of SARS-CoV-2 by FDA under an Emergency Use Authorization (EUA). This EUA will remain in effect (meaning this test can be used) for the duration of the COVID-19 declaration under Section 564(b)(1) of the Act, 21 U.S.C. section 360bbb-3(b)(1), unless the authorization is terminated or revoked.  Performed at [facility], [address], [place], [place] [address]   Urine Culture     Status: Abnormal (Preliminary result)   Collection Time: 11/21/23  8:33 PM   Specimen: Urine, Random  Result Value Ref Range Status   Specimen Description   Final    URINE, RANDOM Performed at [facility], [address], [place], [place] [address]    Special Requests   Final    NONE Reflexed from [id number] Performed at [facility], [address] [place], [place] [address]    Culture (A)  Final    80,000 COLONIES/mL GRAM NEGATIVE RODS CULTURE REINCUBATED FOR BETTER GROWTH SUSCEPTIBILITIES TO FOLLOW Performed at [facility], [address] [place], [place] [address]    Report Status PENDING  Incomplete  Blood Culture (routine x 2)     Status: None (Preliminary result)   Collection Time: 11/21/23  9:10 PM   Specimen: BLOOD LEFT ARM  Result Value Ref Range Status   Specimen Description   Final    BLOOD LEFT ARM Performed at  [facility], [address], [place], [place] [address]    Special Requests   Final    BOTTLES DRAWN AEROBIC AND ANAEROBIC Blood Culture results may not be optimal due to an inadequate volume of blood received in culture bottles Performed at [facility], [address] [place], [place] [address]    Culture  Setup Time   Final    GRAM NEGATIVE RODS AEROBIC BOTTLE ONLY CRITICAL VALUE NOTED.  VALUE IS CONSISTENT WITH PREVIOUSLY REPORTED AND CALLED VALUE. Performed at [facility], [address], [place],  [place] [address]    Culture GRAM NEGATIVE RODS  Final   Report Status PENDING  Incomplete         Radiology Studies: CT ABDOMEN PELVIS W WO CONTRAST Result Date: 11/23/2023 CLINICAL DATA:  Complicated UTI.  Abdominal pain. EXAM: CT ABDOMEN AND PELVIS WITHOUT AND WITH CONTRAST TECHNIQUE: Multidetector CT imaging of the abdomen and pelvis was performed following the standard protocol before and following the bolus administration of intravenous contrast. RADIATION DOSE REDUCTION: This exam was performed according to the departmental dose-optimization program which includes automated exposure control, adjustment of the mA and/or kV according to patient size and/or use of iterative reconstruction technique. CONTRAST:  80mL OMNIPAQUE IOHEXOL 300 MG/ML  SOLN COMPARISON:  01/12/2023 FINDINGS: Lower chest: Subsegmental atelectasis noted in the lung bases. Hepatobiliary: A tiny hypodensity in the liver parenchyma is too small to characterize but is statistically most likely benign. Scattered tiny hypodensities in the liver parenchyma are too small to characterize but are statistically most likely benign. No followup imaging is recommended. There is no evidence for gallstones, gallbladder wall thickening, or pericholecystic fluid. No intrahepatic or extrahepatic biliary dilation. Pancreas: No focal mass lesion. No dilatation of the main duct. No  intraparenchymal cyst. No peripancreatic edema. Spleen: No splenomegaly. No suspicious focal mass lesion. Adrenals/Urinary Tract: 14 mm right adrenal nodule cannot be definitively characterized but is stable since prior. 2 cm left adrenal nodule is stable but cannot be definitively characterized. Mild atrophy noted right kidney. 3.1 cm simple cyst noted upper pole left kidney. Additional tiny hypo attenuating lesions in the left kidney are too small to characterize but stable in the interval and likely benign. No followup imaging is recommended. No evidence for hydroureter. Mild circumferential bladder wall thickening noted with ill definition of the bladder wall. There is a Foley catheter in place with balloon of Foley catheter inflated in the posterior urethra. Stomach/Bowel: Stomach is unremarkable. No gastric wall thickening. No evidence of outlet obstruction. Duodenum is normally positioned as is the ligament of Treitz. No small bowel wall thickening. No small bowel dilatation. The terminal ileum is normal. The appendix is not well visualized, but there is no edema or inflammation in the region of the cecal tip to suggest appendicitis. No gross colonic mass. No colonic wall thickening. Diverticular changes are noted in the left colon without evidence of diverticulitis. Vascular/Lymphatic: There is mild atherosclerotic calcification of the abdominal aorta without aneurysm. There is no gastrohepatic or hepatoduodenal ligament lymphadenopathy. No retroperitoneal or mesenteric lymphadenopathy. No pelvic sidewall lymphadenopathy. Reproductive: The prostate gland and seminal vesicles are unremarkable. Other: No intraperitoneal free fluid. Musculoskeletal: No worrisome lytic or sclerotic osseous abnormality. Thoracolumbar fusion hardware evident. IMPRESSION: 1. Foley catheter in place with balloon of Foley catheter inflated in the posterior urethra. Repositioning recommended. 2. Mild circumferential bladder wall  thickening with ill definition of the bladder wall. Imaging features concerning for acute cystitis. 3. Left colonic diverticulosis without diverticulitis. 4. Stable bilateral adrenal nodules, indeterminate but stable in the nearly 1 year interval since prior study. Right adrenal nodule is stable comparing back to chest CTA 02/09/2008 although left adrenal gland was not included on that study. Likely benign lipid poor adenomas, no further imaging follow-up recommended. 5.  Aortic Atherosclerois (ICD10-170.0) These results will be called to the ordering clinician or representative by the Radiologist Assistant, and communication documented in the PACS or [employer]. Electronically Signed   By: Kennith Center M.D.   On: 11/23/2023 06:28   DG Chest [facility] [address]  Result Date: 11/21/2023 CLINICAL DATA:  Possible sepsis EXAM: PORTABLE CHEST 1 VIEW COMPARISON:  01/30/2023 FINDINGS: Cardiac shadow is enlarged but stable. Aortic calcifications are noted. The lungs are well aerated bilaterally. Mild left basilar atelectasis is seen. No bony abnormality is noted. IMPRESSION: Mild left basilar atelectasis is noted. Electronically Signed   By: Violeta Grey M.D.   On: 11/21/2023 21:24    Scheduled Meds:  allopurinol  100 mg Oral Daily   apixaban  5 mg Oral BID   atorvastatin  20 mg Oral Daily   Chlorhexidine Gluconate Cloth  6 each Topical Daily   insulin aspart  0-6 Units Subcutaneous TID WC   metoprolol tartrate  25 mg Oral BID   sodium chloride flush  3 mL Intravenous Q12H   spironolactone  25 mg Oral Daily   torsemide  20 mg Oral Daily   Continuous Infusions:  cefTRIAXone (ROCEPHIN)  IV 2 g (11/22/23 2147)     LOS: 1 day    Time spent: 38 min  Barbee Lew, MD  11/23/2023, 1:10 PM  Full code

## 2023-11-23 NOTE — Plan of Care (Signed)
   Problem: Education: Goal: Knowledge of General Education information will improve Description Including pain rating scale, medication(s)/side effects and non-pharmacologic comfort measures Outcome: Progressing

## 2023-11-23 NOTE — Telephone Encounter (Signed)
I have sent urgent message to [employer] asking them to check into this issue 

## 2023-11-23 NOTE — Progress Notes (Signed)
 Physical Therapy Treatment Patient Details Name: [name] [name] MRN: [id number] DOB: [birth year] Today's Date: 11/23/2023   History of Present Illness [name] [name] is a 78 y.o. 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  Feeling "Okay" Assisted OOB to amb went well.  Pt does struggle to get OOB due to ABD girth. General bed mobility comments: requires significant help due to groin pain, assisted in scooting but once hand holds in place, pt tolerates better and is able to get feet on floor.  Pt has a hospital bed at home and elevates the [facility] "all the way".  Daughter indicated "this is how he does it". General transfer comment: "once he is up he is good" stated Daughter.  Pt was able to self rise using walker.  Remained on 3 lts CHRONIC USE with sats avg 97% and HR 84. General Gait Details: Pt tolerated amb a functional distance with walker at Supervision level.  No overt LOB.  Mild dyspnea.  Appears at prior level.  Remained on 3 lts CHRONIC USE sats avg 97%.  No pain other than "scrotal" discomfirt.  CHRONIC foley. Pt plans to return home when medically cleared.  LPT has rec HH PT and a RW.    If plan is discharge home, recommend the following: A little help with walking and/or transfers;A little help with bathing/dressing/bathroom;Assistance with cooking/housework;Assist for transportation;Help with stairs or ramp for entrance   Can travel by private vehicle        Equipment Recommendations  Rolling walker (2 wheels)    Recommendations for Other Services       Precautions / Restrictions Precautions Precautions: Fall Precaution/Restrictions  Comments: home oxygen 3 lts Restrictions Weight Bearing Restrictions Per Provider Order: No     Mobility  Bed Mobility Overal bed mobility: Needs Assistance Bed Mobility: Supine to Sit, Sit to Supine     Supine to sit: Zeke assist Sit to supine: Jayr assist   General bed mobility comments: requires significant help due to groin pain, assisted in scooting but once hand holds in place, pt tolerates better and is able to get feet on floor.  Pt has a hospital bed at home and elevates the [facility] "all the way".  Daughter indicated "this is how he does it".    Transfers Overall transfer level: Needs assistance Equipment used: Rolling walker (2 wheels) Transfers: Sit to/from Stand, Bed to chair/wheelchair/BSC Sit to Stand: Supervision           General transfer comment: "once he is up he is good" stated Daughter.  Pt was able to self rise using walker.  Remained on 3 lts CHRONIC USE with sats avg 97% and HR 84.    Ambulation/Gait Ambulation/Gait assistance: Supervision Gait Distance (Feet): 45 Feet Assistive device: Rolling walker (2 wheels) Gait Pattern/deviations: Step-through pattern, Decreased stride length Gait velocity: decreased     General Gait Details: Pt tolerated amb a functional distance with walker at Supervision level.  No overt LOB.  Mild dyspnea.  Appears at prior level.  Remained on 3 lts CHRONIC USE sats avg 97%.  No pain other than "scrotal" discomfirt.  CHRONIC foley.   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  Feeling "Okay"         Cueing    Exercises      General Comments        Pertinent Vitals/Pain Pain Assessment Pain Assessment: No/denies pain    Home Living                          Prior Function            PT Goals (current goals can now be found in the care plan section) Progress towards PT goals: Progressing toward goals    Frequency    Min 3X/week      PT Plan      Co-evaluation              AM-PAC PT "6 Clicks" Mobility   Outcome Measure  Help needed turning from your back to your side while in a flat bed without using bedrails?: A Lot Help needed moving from lying on your back to sitting on the side of a flat bed without using bedrails?: A Lot Help needed moving to and from a bed to a chair (including a wheelchair)?: A Little Help needed standing up from a chair using your arms (e.g., wheelchair or bedside chair)?: A Little Help needed to walk in hospital room?: A Little Help needed climbing 3-5 steps with a railing? : A Lot 6 Click Score: 15    End of Session Equipment Utilized During Treatment: Gait belt;Oxygen Activity Tolerance: Patient limited by fatigue Patient left: in bed;with call bell/phone within reach;with family/visitor present Nurse Communication: Mobility status PT Visit Diagnosis: Unsteadiness on feet (R26.81);Difficulty in walking, not elsewhere classified (R26.2);Muscle weakness (generalized) (M62.81);Pain     Time: 1330-1355 PT Time Calculation (min) (ACUTE ONLY): 25 min  Charges:    $Gait Training: 8-22 mins $Therapeutic Activity: 8-22 mins PT General Charges $$ ACUTE PT VISIT: 1 Visit                     Bess Broody  PTA Acute  Rehabilitation Services Office M-F          [phone]

## 2023-11-23 NOTE — TOC Progression Note (Signed)
 Transition of Care [facility]) - Progression Note    Patient Details  Name: [name] [name] MRN: [id number] Date of Birth: 28-Oct-1945  [facility] CM/SW Contact  Marty Sleet, LCSW Phone Number: 11/23/2023, 2:20 PM  Clinical Narrative:    Met with pt and daughter at bedside and confirmed interest in adding PT to pt's [facility] services. Pt/daughter also interested in having OT arranged. Contacted Angela with [facility] and confirmed agency able to provide HHPT/OT/RN. HH orders will need to be placed prior to discharge.  PT recommended a RW for pt however, per daughter pt already has RW at home as well as rollator and wheelchair. No DME needs identified.    Expected Discharge Plan: Home w Home Health Services Barriers to Discharge: No Barriers Identified  Expected Discharge Plan and Services In-house Referral: Clinical Social Work Discharge Planning Services: NA Post Acute Care Choice: Resumption of Svcs/PTA Provider Living arrangements for the past 2 months: Single Family Home                 DME Arranged: N/A DME Agency: NA       HH Arranged: RN, PT, OT HH Agency: [facility] Date HH Agency Contacted: 11/23/23 Time HH Agency Contacted: 1420 Representative spoke with at [facility] Agency: Shelvy Dickens   Social Determinants of Health (SDOH) Interventions SDOH Screenings   Food Insecurity: No Food Insecurity (11/22/2023)  Housing: High Risk (11/22/2023)  Transportation Needs: No Transportation Needs (11/22/2023)  Utilities: Not At Risk (11/22/2023)  Alcohol Screen: Low Risk  (02/11/2023)  Depression (PHQ2-9): Low Risk  (07/01/2023)  Financial Resource Strain: Low Risk  (03/24/2023)  Physical Activity: Unknown (03/24/2023)  Social Connections: Unknown (11/22/2023)  Stress: No Stress Concern Present (07/01/2023)  Tobacco Use: Medium Risk (11/22/2023)    Readmission Risk Interventions    11/23/2023    2:19 PM 02/15/2023    2:47 PM 01/14/2023    1:35 PM  Readmission Risk Prevention Plan   Transportation Screening Complete Complete Complete  PCP or Specialist Appt within 3-5 Days Complete Complete   HRI or Home Care Consult Complete Complete   Social Work Consult for Recovery Care Planning/Counseling Complete Complete   Palliative Care Screening Not Applicable Complete   Medication Review Oceanographer) Complete Complete Complete  PCP or Specialist appointment within 3-5 days of discharge   Complete  HRI or Home Care Consult   Complete  SW Recovery Care/Counseling Consult   --  Palliative Care Screening   Not Applicable  Skilled Nursing Facility   Not Applicable

## 2023-11-24 DIAGNOSIS — N390 Urinary tract infection, site not specified: Secondary | ICD-10-CM | POA: Diagnosis not present

## 2023-11-24 LAB — GLUCOSE, CAPILLARY
Glucose-Capillary: 111 mg/dL — ABNORMAL HIGH (ref 70–99)
Glucose-Capillary: 149 mg/dL — ABNORMAL HIGH (ref 70–99)
Glucose-Capillary: 114 mg/dL — ABNORMAL HIGH (ref 70–99)
Glucose-Capillary: 134 mg/dL — ABNORMAL HIGH (ref 70–99)

## 2023-11-24 LAB — URINE CULTURE: Culture: 80000 — AB

## 2023-11-24 LAB — CULTURE, BLOOD (ROUTINE X 2): Special Requests: ADEQUATE

## 2023-11-24 MED ORDER — AMOXICILLIN-POT CLAVULANATE 875-125 MG PO TABS
1.0000 | ORAL_TABLET | Freq: Two times a day (BID) | ORAL | Status: DC
  Administered 2023-11-24 – 2023-11-25 (×2): 1 via ORAL
  Filled 2023-11-24 (×2): qty 1

## 2023-11-24 NOTE — Telephone Encounter (Signed)
 I have received a message from [place] with [employer], Dariel Edelson   This patient has insurance that requires him to go through [facility] for resupply orders. He can reach [facility] at [phone] or by email at [email or web address].  I have also faxed the mask order to [facility] and they normally state when the patient needs supplies to call

## 2023-11-24 NOTE — Plan of Care (Signed)

## 2023-11-24 NOTE — Progress Notes (Signed)
   11/24/23 0100  BiPAP/CPAP/SIPAP  $ Non-Invasive Home Ventilator  Initial  $ Face Mask Large  Yes  BiPAP/CPAP/SIPAP Pt Type Adult  BiPAP/CPAP/SIPAP DREAMSTATIOND  Mask Type Full face mask  Mask Size Large  Respiratory Rate 18 breaths/min  Flow Rate 3 lpm  Patient Home Machine No  Patient Home Mask No  Patient Home Tubing No  Auto Titrate Yes  Minimum cmH2O 10 cmH2O  Maximum cmH2O 20 cmH2O  Device Plugged into [employer] Yes

## 2023-11-24 NOTE — Progress Notes (Addendum)
 PROGRESS NOTE    [name] [name]  VWU:[id number] DOB: 1945/09/05 DOA: 11/21/2023 PCP: Corwin Levins, MD   Brief Narrative:  78 y.o. male with hx of chronic BPH with indwelling Foley catheter, additional history of HFpEF, chronic A-fib on AC, pulmonary hypertension, OHS/OSA, CHHRF on 3L O2, BiPAP at night, CKD stage IIIb, hypertension, hyperlipidemia, diabetes, admitted with hematuria and urethral pain With chills.  Is followed by home health for changing his chronic Foley catheter once a month.      Assessment & Plan:   Principal Problem:   Complicated UTI (urinary tract infection) Active Problems:   Sepsis secondary to UTI (HCC)  Proteus bacteremia- Complicated UTI, catheter associated (catheter present on admission) Sepsis secondary to above, present on admission Admitted with hematuria and dysuria, in the ER he was hypotensive with blood pressure 80 over 50s with leukocytosis and UA consistent with urinary tract infection.  He was initially started on cefepime then changed to Rocephin as his blood culture came back positive for Proteus.  Blood pressure improved with IV fluids.plan on home tomorrow on PO antibiotics.(Augmentin 875 mg bid 10 days total) Oob ambulate   Hematuria ? Urethral injury   BPH with chronic indwelling Foley-patient developed hematuria after catheter exchange in the setting of Eliquis.  Currently his urine has been clear with no clots. HFpEF: Not on diuretics at this time Chronic A-fib: Resume home Eliquis, metoprolol 25 mg twice daily Pulmonary hypertension: Noted OHS/ Chronic hypoxic hypercapnic history failure:  On 3L O2 , Bipap nightly CKD stage IIIb: Baseline creatinine around 1.9 Hypertension: On beta-blocker per above Hyperlipidemia: Continue home atorvastatin Diabetes type 2: Hold home glipizide.  SSI for very sensitive while inpatient.  Check A1c OSA: BiPAP nightly Gout: Continue home allopurinol  Pressure injury stage I left buttocks present on admission  intact skin with nonblanchable redness over bony prominence    Pressure Injury 11/22/23 Buttocks Left Stage 1 -  Intact skin with non-blanchable redness of a localized area usually over a bony prominence. unblanchable, redness (Active)  11/22/23 0102  Location: Buttocks  Location Orientation: Left  Staging: Stage 1 -  Intact skin with non-blanchable redness of a localized area usually over a bony prominence.  Wound Description (Comments): unblanchable, redness  Present on Admission: Yes  Dressing Type Foam - Lift dressing to assess site every shift 11/22/23 2140    Estimated body mass index is 37.21 kg/m as calculated from the following:   Height as of this encounter: 5\' 8"  (1.727 m).   Weight as of this encounter: 111 kg.  DVT prophylaxis: ELIQUIS Code Status: full Family Communication: none Disposition Plan:  Status is: Inpatient Remains inpatient appropriate because: acute illness   Consultants:  none  Procedures: none Antimicrobials:rocephin  Subjective: Feels better than yesterday still very weak Foley in place urine clear  Objective: Vitals:   11/23/23 1411 11/23/23 2027 11/24/23 0447 11/24/23 1052  BP: 113/76 113/71 108/68 128/78  Pulse: 73 72 79 73  Resp: 17 18 18    Temp: 98 F (36.7 C) 98.6 F (37 C) 98.4 F (36.9 C)   TempSrc:      SpO2: 100% 98% 98%   Weight:      Height:        Intake/Output Summary (Last 24 hours) at 11/24/2023 1057 Last data filed at 11/24/2023 0710 Gross per 24 hour  Intake 200 ml  Output 1950 ml  Net -1750 ml   Filed Weights   11/22/23 0100  Weight: 111 kg  Examination:  General exam: Appears chronically ill appearing  Respiratory system: Clear to auscultation. Respiratory effort normal. Cardiovascular system:.irreg Gastrointestinal system: Abdomen is nondistended, soft and nontender. No organomegaly or masses felt. Normal bowel sounds heard. Central nervous system: Alert and oriented. No focal neurological  deficits. Extremities: no edema    Data Reviewed: I have personally reviewed following labs and imaging studies  CBC: Recent Labs  Lab 11/21/23 2033 11/22/23 0535 11/23/23 0530  WBC 14.8* 13.8* 9.3  NEUTROABS 12.5*  --   --   HGB 12.9* 11.6* 11.6*  HCT 39.4 37.4* 37.1*  MCV 88.7 92.6 92.5  PLT 221 173 179   Basic Metabolic Panel: Recent Labs  Lab 11/21/23 2033 11/22/23 0535 11/23/23 0530  NA 140 141 135  K 5.0 4.3 4.4  CL 105 110 105  CO2 26 22 22   GLUCOSE 153* 103* 100*  BUN 45* 43* 34*  CREATININE 1.97* 1.91* 1.91*  CALCIUM 9.8 8.9 8.8*  MG  --  1.8  --   PHOS  --  3.1  --    GFR: Estimated Creatinine Clearance: 39.1 mL/min (A) (by C-G formula based on SCr of 1.91 mg/dL (H)). Liver Function Tests: Recent Labs  Lab 11/21/23 2033  AST 15  ALT 12  ALKPHOS 64  BILITOT 1.3*  PROT 7.3  ALBUMIN 3.7   No results for input(s): "LIPASE", "AMYLASE" in the last 168 hours. No results for input(s): "AMMONIA" in the last 168 hours. Coagulation Profile: Recent Labs  Lab 11/21/23 2033  INR 1.3*   Cardiac Enzymes: No results for input(s): "CKTOTAL", "CKMB", "CKMBINDEX", "TROPONINI" in the last 168 hours. BNP (last 3 results) Recent Labs    03/24/23 1144  PROBNP 63.0   HbA1C: Recent Labs    11/22/23 1209  HGBA1C 6.0*   CBG: Recent Labs  Lab 11/23/23 0718 11/23/23 1123 11/23/23 1716 11/23/23 2128 11/24/23 0745  GLUCAP 90 122* 104* 134* 111*   Lipid Profile: No results for input(s): "CHOL", "HDL", "LDLCALC", "TRIG", "CHOLHDL", "LDLDIRECT" in the last 72 hours. Thyroid Function Tests: No results for input(s): "TSH", "T4TOTAL", "FREET4", "T3FREE", "THYROIDAB" in the last 72 hours. Anemia Panel: No results for input(s): "VITAMINB12", "FOLATE", "FERRITIN", "TIBC", "IRON", "RETICCTPCT" in the last 72 hours. Sepsis Labs: Recent Labs  Lab 11/21/23 2033 11/22/23 0106  LATICACIDVEN 1.6 1.4    Recent Results (from the past 240 hours)  Blood Culture  (routine x 2)     Status: Abnormal   Collection Time: 11/21/23  8:32 PM   Specimen: BLOOD RIGHT HAND  Result Value Ref Range Status   Specimen Description   Final    BLOOD RIGHT HAND Performed at [facility], [address] [place], [place] [address]    Special Requests   Final    BOTTLES DRAWN AEROBIC AND ANAEROBIC Blood Culture adequate volume Performed at [facility], [address] [place], [place] [address]    Culture  Setup Time   Final    GRAM NEGATIVE RODS IN BOTH AEROBIC AND ANAEROBIC BOTTLES CRITICAL RESULT CALLED TO, READ BACK BY AND VERIFIED WITH: PHARMD DREW WOFFORD ON 11/22/23 @ 1932 BY DRT Performed at [facility], [address] [place], [place] [address]    Culture PROTEUS MIRABILIS (A)  Final   Report Status 11/24/2023 FINAL  Final   Organism ID, Bacteria PROTEUS MIRABILIS  Final   Organism ID, Bacteria PROTEUS MIRABILIS  Final      Susceptibility   Proteus mirabilis - KIRBY BAUER*  CEFAZOLIN INTERMEDIATE Intermediate    Proteus mirabilis - MIC*    AMPICILLIN <=2 SENSITIVE Sensitive     CEFEPIME <=0.12 SENSITIVE Sensitive     CEFTAZIDIME <=1 SENSITIVE Sensitive     CEFTRIAXONE <=0.25 SENSITIVE Sensitive     CIPROFLOXACIN <=0.25 SENSITIVE Sensitive     GENTAMICIN <=1 SENSITIVE Sensitive     IMIPENEM 4 SENSITIVE Sensitive     TRIMETH/SULFA <=20 SENSITIVE Sensitive     AMPICILLIN/SULBACTAM <=2 SENSITIVE Sensitive     PIP/TAZO <=4 SENSITIVE Sensitive ug/mL    * PROTEUS MIRABILIS    PROTEUS MIRABILIS  Blood Culture ID Panel (Reflexed)     Status: Abnormal   Collection Time: 11/21/23  8:32 PM  Result Value Ref Range Status   Enterococcus faecalis NOT DETECTED NOT DETECTED Final   Enterococcus Faecium NOT DETECTED NOT DETECTED Final   Listeria monocytogenes NOT DETECTED NOT DETECTED Final   Staphylococcus species NOT DETECTED NOT DETECTED Final   Staphylococcus aureus (BCID) NOT DETECTED NOT DETECTED Final    Staphylococcus epidermidis NOT DETECTED NOT DETECTED Final   Staphylococcus lugdunensis NOT DETECTED NOT DETECTED Final   Streptococcus species NOT DETECTED NOT DETECTED Final   Streptococcus agalactiae NOT DETECTED NOT DETECTED Final   Streptococcus pneumoniae NOT DETECTED NOT DETECTED Final   Streptococcus pyogenes NOT DETECTED NOT DETECTED Final   A.calcoaceticus-baumannii NOT DETECTED NOT DETECTED Final   Bacteroides fragilis NOT DETECTED NOT DETECTED Final   Enterobacterales DETECTED (A) NOT DETECTED Final    Comment: Enterobacterales represent a large order of gram negative bacteria, not a single organism. CRITICAL RESULT CALLED TO, READ BACK BY AND VERIFIED WITH: PHARMD DREW WOFFORD ON 11/22/23 @ 1932 BY DRT    Enterobacter cloacae complex NOT DETECTED NOT DETECTED Final   Escherichia coli NOT DETECTED NOT DETECTED Final   Klebsiella aerogenes NOT DETECTED NOT DETECTED Final   Klebsiella oxytoca NOT DETECTED NOT DETECTED Final   Klebsiella pneumoniae NOT DETECTED NOT DETECTED Final   Proteus species DETECTED (A) NOT DETECTED Final    Comment: CRITICAL RESULT CALLED TO, READ BACK BY AND VERIFIED WITH: PHARMD DREW WOFFORD ON 11/22/23 @ 1932 BY DRT    Salmonella species NOT DETECTED NOT DETECTED Final   Serratia marcescens NOT DETECTED NOT DETECTED Final   Haemophilus influenzae NOT DETECTED NOT DETECTED Final   Neisseria meningitidis NOT DETECTED NOT DETECTED Final   Pseudomonas aeruginosa NOT DETECTED NOT DETECTED Final   Stenotrophomonas maltophilia NOT DETECTED NOT DETECTED Final   Candida albicans NOT DETECTED NOT DETECTED Final   Candida auris NOT DETECTED NOT DETECTED Final   Candida glabrata NOT DETECTED NOT DETECTED Final   Candida krusei NOT DETECTED NOT DETECTED Final   Candida parapsilosis NOT DETECTED NOT DETECTED Final   Candida tropicalis NOT DETECTED NOT DETECTED Final   Cryptococcus neoformans/gattii NOT DETECTED NOT DETECTED Final   CTX-M ESBL NOT DETECTED NOT  DETECTED Final   Carbapenem resistance IMP NOT DETECTED NOT DETECTED Final   Carbapenem resistance KPC NOT DETECTED NOT DETECTED Final   Carbapenem resistance NDM NOT DETECTED NOT DETECTED Final   Carbapenem resist OXA 48 LIKE NOT DETECTED NOT DETECTED Final   Carbapenem resistance VIM NOT DETECTED NOT DETECTED Final    Comment: Performed at [facility], [address], [place], [place] [address]  Resp panel by RT-PCR (RSV, Flu A&B, Covid) Peripheral     Status: None   Collection Time: 11/21/23  8:33 PM   Specimen: Peripheral; Nasal Swab  Result Value Ref Range  Status   SARS Coronavirus 2 by RT PCR NEGATIVE NEGATIVE Final    Comment: (NOTE) SARS-CoV-2 target nucleic acids are NOT DETECTED.  The SARS-CoV-2 RNA is generally detectable in upper respiratory specimens during the acute phase of infection. The lowest concentration of SARS-CoV-2 viral copies this assay can detect is 138 copies/mL. A negative result does not preclude SARS-Cov-2 infection and should not be used as the sole basis for treatment or other patient management decisions. A negative result may occur with  improper specimen collection/handling, submission of specimen other than nasopharyngeal swab, presence of viral mutation(s) within the areas targeted by this assay, and inadequate number of viral copies(<138 copies/mL). A negative result must be combined with clinical observations, patient history, and epidemiological information. The expected result is Negative.  Fact Sheet for Patients:  BloggerCourse.com  Fact Sheet for Healthcare Providers:  SeriousBroker.it  This test is no t yet approved or cleared by the [place]  FDA and  has been authorized for detection and/or diagnosis of SARS-CoV-2 by FDA under an Emergency Use Authorization (EUA). This EUA will remain  in effect (meaning this test can be used) for the duration of the COVID-19 declaration  under Section 564(b)(1) of the Act, 21 U.S.C.section 360bbb-3(b)(1), unless the authorization is terminated  or revoked sooner.       Influenza A by PCR NEGATIVE NEGATIVE Final   Influenza B by PCR NEGATIVE NEGATIVE Final    Comment: (NOTE) The Xpert Xpress SARS-CoV-2/FLU/RSV plus assay is intended as an aid in the diagnosis of influenza from Nasopharyngeal swab specimens and should not be used as a sole basis for treatment. Nasal washings and aspirates are unacceptable for Xpert Xpress SARS-CoV-2/FLU/RSV testing.  Fact Sheet for Patients: BloggerCourse.com  Fact Sheet for Healthcare Providers: SeriousBroker.it  This test is not yet approved or cleared by the [place]  FDA and has been authorized for detection and/or diagnosis of SARS-CoV-2 by FDA under an Emergency Use Authorization (EUA). This EUA will remain in effect (meaning this test can be used) for the duration of the COVID-19 declaration under Section 564(b)(1) of the Act, 21 U.S.C. section 360bbb-3(b)(1), unless the authorization is terminated or revoked.     Resp Syncytial Virus by PCR NEGATIVE NEGATIVE Final    Comment: (NOTE) Fact Sheet for Patients: BloggerCourse.com  Fact Sheet for Healthcare Providers: SeriousBroker.it  This test is not yet approved or cleared by the [place]  FDA and has been authorized for detection and/or diagnosis of SARS-CoV-2 by FDA under an Emergency Use Authorization (EUA). This EUA will remain in effect (meaning this test can be used) for the duration of the COVID-19 declaration under Section 564(b)(1) of the Act, 21 U.S.C. section 360bbb-3(b)(1), unless the authorization is terminated or revoked.  Performed at [facility], [address], [place], [place] [address]   Urine Culture     Status: Abnormal   Collection Time: 11/21/23  8:33 PM   Specimen:  Urine, Random  Result Value Ref Range Status   Specimen Description   Final    URINE, RANDOM Performed at [facility], [address] [place], [place] [address]    Special Requests   Final    NONE Reflexed from [id number] Performed at [facility], [address], [place], [place] [address]    Culture 80,000 COLONIES/mL PROTEUS MIRABILIS (A)  Final   Report Status 11/24/2023 FINAL  Final   Organism ID, Bacteria PROTEUS MIRABILIS (A)  Final      Susceptibility   Proteus  mirabilis - MIC*    AMPICILLIN <=2 SENSITIVE Sensitive     CEFAZOLIN <=4 SENSITIVE Sensitive     CEFEPIME <=0.12 SENSITIVE Sensitive     CEFTRIAXONE <=0.25 SENSITIVE Sensitive     CIPROFLOXACIN <=0.25 SENSITIVE Sensitive     GENTAMICIN <=1 SENSITIVE Sensitive     IMIPENEM 2 SENSITIVE Sensitive     NITROFURANTOIN 128 RESISTANT Resistant     TRIMETH/SULFA <=20 SENSITIVE Sensitive     AMPICILLIN/SULBACTAM <=2 SENSITIVE Sensitive     PIP/TAZO <=4 SENSITIVE Sensitive ug/mL    * 80,000 COLONIES/mL PROTEUS MIRABILIS  Blood Culture (routine x 2)     Status: None (Preliminary result)   Collection Time: 11/21/23  9:10 PM   Specimen: BLOOD LEFT ARM  Result Value Ref Range Status   Specimen Description   Final    BLOOD LEFT ARM Performed at [facility], [address] [place], [place] [address]    Special Requests   Final    BOTTLES DRAWN AEROBIC AND ANAEROBIC Blood Culture results may not be optimal due to an inadequate volume of blood received in culture bottles Performed at [facility], [address] [place], [place] [address]    Culture  Setup Time   Final    GRAM NEGATIVE RODS AEROBIC BOTTLE ONLY CRITICAL VALUE NOTED.  VALUE IS CONSISTENT WITH PREVIOUSLY REPORTED AND CALLED VALUE. Performed at [facility], [address], [place], [place] [address]    Culture GRAM NEGATIVE RODS  Final   Report Status PENDING  Incomplete          Radiology Studies: CT ABDOMEN PELVIS W WO CONTRAST Result Date: 11/23/2023 CLINICAL DATA:  Complicated UTI.  Abdominal pain. EXAM: CT ABDOMEN AND PELVIS WITHOUT AND WITH CONTRAST TECHNIQUE: Multidetector CT imaging of the abdomen and pelvis was performed following the standard protocol before and following the bolus administration of intravenous contrast. RADIATION DOSE REDUCTION: This exam was performed according to the departmental dose-optimization program which includes automated exposure control, adjustment of the mA and/or kV according to patient size and/or use of iterative reconstruction technique. CONTRAST:  80mL OMNIPAQUE IOHEXOL 300 MG/ML  SOLN COMPARISON:  01/12/2023 FINDINGS: Lower chest: Subsegmental atelectasis noted in the lung bases. Hepatobiliary: A tiny hypodensity in the liver parenchyma is too small to characterize but is statistically most likely benign. Scattered tiny hypodensities in the liver parenchyma are too small to characterize but are statistically most likely benign. No followup imaging is recommended. There is no evidence for gallstones, gallbladder wall thickening, or pericholecystic fluid. No intrahepatic or extrahepatic biliary dilation. Pancreas: No focal mass lesion. No dilatation of the main duct. No intraparenchymal cyst. No peripancreatic edema. Spleen: No splenomegaly. No suspicious focal mass lesion. Adrenals/Urinary Tract: 14 mm right adrenal nodule cannot be definitively characterized but is stable since prior. 2 cm left adrenal nodule is stable but cannot be definitively characterized. Mild atrophy noted right kidney. 3.1 cm simple cyst noted upper pole left kidney. Additional tiny hypo attenuating lesions in the left kidney are too small to characterize but stable in the interval and likely benign. No followup imaging is recommended. No evidence for hydroureter. Mild circumferential bladder wall thickening noted with ill definition of the bladder wall.  There is a Foley catheter in place with balloon of Foley catheter inflated in the posterior urethra. Stomach/Bowel: Stomach is unremarkable. No gastric wall thickening. No evidence of outlet obstruction. Duodenum is normally positioned as is the ligament of Treitz. No small bowel wall thickening. No  small bowel dilatation. The terminal ileum is normal. The appendix is not well visualized, but there is no edema or inflammation in the region of the cecal tip to suggest appendicitis. No gross colonic mass. No colonic wall thickening. Diverticular changes are noted in the left colon without evidence of diverticulitis. Vascular/Lymphatic: There is mild atherosclerotic calcification of the abdominal aorta without aneurysm. There is no gastrohepatic or hepatoduodenal ligament lymphadenopathy. No retroperitoneal or mesenteric lymphadenopathy. No pelvic sidewall lymphadenopathy. Reproductive: The prostate gland and seminal vesicles are unremarkable. Other: No intraperitoneal free fluid. Musculoskeletal: No worrisome lytic or sclerotic osseous abnormality. Thoracolumbar fusion hardware evident. IMPRESSION: 1. Foley catheter in place with balloon of Foley catheter inflated in the posterior urethra. Repositioning recommended. 2. Mild circumferential bladder wall thickening with ill definition of the bladder wall. Imaging features concerning for acute cystitis. 3. Left colonic diverticulosis without diverticulitis. 4. Stable bilateral adrenal nodules, indeterminate but stable in the nearly 1 year interval since prior study. Right adrenal nodule is stable comparing back to chest CTA 02/09/2008 although left adrenal gland was not included on that study. Likely benign lipid poor adenomas, no further imaging follow-up recommended. 5.  Aortic Atherosclerois (ICD10-170.0) These results will be called to the ordering clinician or representative by the Radiologist Assistant, and communication documented in the PACS or [employer].  Electronically Signed   By: Donnal Fusi M.D.   On: 11/23/2023 06:28    Scheduled Meds:  allopurinol  100 mg Oral Daily   amoxicillin-clavulanate  1 tablet Oral Q12H   apixaban  5 mg Oral BID   atorvastatin  20 mg Oral Daily   Chlorhexidine Gluconate Cloth  6 each Topical Daily   insulin aspart  0-6 Units Subcutaneous TID WC   metoprolol tartrate  25 mg Oral BID   sodium chloride flush  3 mL Intravenous Q12H   spironolactone  25 mg Oral Daily   torsemide  20 mg Oral Daily   Continuous Infusions:     LOS: 2 days    Time spent: 38 min  Barbee Lew, MD  11/24/2023, 10:57 AM  Full code

## 2023-11-25 DIAGNOSIS — N390 Urinary tract infection, site not specified: Secondary | ICD-10-CM | POA: Diagnosis not present

## 2023-11-25 LAB — GLUCOSE, CAPILLARY
Glucose-Capillary: 130 mg/dL — ABNORMAL HIGH (ref 70–99)
Glucose-Capillary: 114 mg/dL — ABNORMAL HIGH (ref 70–99)

## 2023-11-25 MED ORDER — AMOXICILLIN-POT CLAVULANATE 875-125 MG PO TABS: 1.0000 | ORAL_TABLET | Freq: Two times a day (BID) | ORAL | 0 refills | Status: DC

## 2023-11-25 NOTE — Plan of Care (Addendum)
 VSS. Remains on 3L [place]. No c/o pain. BG 134 at bedtime. Foley in place draining yellow urine. No acute events overnight.  Problem: Education: Goal: Knowledge of General Education information will improve Description: Including pain rating scale, medication(s)/side effects and non-pharmacologic comfort measures Outcome: Progressing   Problem: Clinical Measurements: Goal: Ability to maintain clinical measurements within normal limits will improve Outcome: Progressing Goal: Will remain free from infection Outcome: Progressing   Problem: Pain Managment: Goal: General experience of comfort will improve and/or be controlled Outcome: Progressing   Problem: Safety: Goal: Ability to remain free from injury will improve Outcome: Progressing   Problem: Fluid Volume: Goal: Ability to maintain a balanced intake and output will improve Outcome: Progressing   Problem: Metabolic: Goal: Ability to maintain appropriate glucose levels will improve Outcome: Progressing   Problem: Nutritional: Goal: Maintenance of adequate nutrition will improve Outcome: Progressing   Problem: Tissue Perfusion: Goal: Adequacy of tissue perfusion will improve Outcome: Progressing   Problem: Urinary Elimination: Goal: Signs and symptoms of infection will decrease Outcome: Progressing

## 2023-11-25 NOTE — Progress Notes (Signed)
 [name] [name] Liaison Note  11/25/2023  [name] [name] [birth year] [id number]  Location: RN Hospital Liaison screened the patient remotely at [facility].  Insurance: [employer] and [employer] for Life   [name] is a 78 y.o. male who is a Primary Care Patient of John, Alveda Aures, MD [facility]. The patient was screened for readmission hospitalization with noted high risk score for unplanned readmission risk with 1 IP in 6 months.  The patient was assessed for potential Care Management service needs for post hospital transition for care coordination. Review of patient's electronic medical record reveals patient was admitted for Complicated UTI. Pt discharged home with HHPT/RN[facility]. No anticipated needs at this time.  Plan: [name] Liaison will continue to follow progress and disposition to asess for post hospital community care coordination/management needs.  Referral request for community care coordination: anticipate Transitions of Care Team follow up.   [facility] Care Management/Population Health does not replace or interfere with any arrangements made by the Inpatient Transition of Care team.   For questions contact:   Lilla Reichert, RN, BSN Hospital Liaison [place]   [facility], Population Health Office Hours MTWF  8:00 am-6:00 pm Direct Dial: [phone] mobile [email or web address]

## 2023-11-25 NOTE — Discharge Summary (Signed)
 Physician Discharge Summary  [name] [name]:[id number] DOB: Sep 12, 1945 DOA: 11/21/2023  PCP: Roslyn Coombe, MD  Admit date: 11/21/2023 Discharge date: 11/25/2023  Admitted From: home Disposition:home Recommendations for Outpatient Follow-up:  Follow up with PCP in 1-2 weeks Please obtain BMP/CBC in one week  Home Health:yes Equipment/Devices:none  Discharge Condition:stable CODE STATUS:full Diet recommendation:cardiac Brief/Interim Summary:  78 y.o. male with hx of chronic BPH with indwelling Foley catheter, additional history of HFpEF, chronic A-fib on AC, pulmonary hypertension, OHS/OSA, CHHRF on 3L O2, BiPAP at night, CKD stage IIIb, hypertension, hyperlipidemia, diabetes, admitted with hematuria and urethral pain With chills.  Is followed by home health for changing his chronic Foley catheter once a month.    Discharge Diagnoses:  Principal Problem:   Complicated UTI (urinary tract infection) Active Problems:   Sepsis secondary to UTI (HCC)  Proteus bacteremia- Complicated UTI, catheter associated (catheter present on admission) Sepsis secondary to above, present on admission Admitted with hematuria and dysuria, in the ER he was hypotensive with blood pressure 80 over 50s with leukocytosis and UA consistent with urinary tract infection.  He was initially started on cefepime  then changed to Rocephin  as his blood culture came back positive for Proteus.  Blood pressure improved with IV fluids.he was discharged on Augmentin  875 mg twice a day for a total of 10 days    Hematuria ? Urethral injury   BPH with chronic indwelling Foley-patient developed hematuria after catheter exchange in the setting of Eliquis .  Currently his urine has been clear with no clots.  HFpEF: Not on diuretics at this time Chronic A-fib: Resume home Eliquis , metoprolol  25 mg twice daily Pulmonary hypertension: Noted OHS/ Chronic hypoxic hypercapnic history failure:  On 3L O2 , Bipap nightly CKD stage IIIb:  Baseline creatinine around 1.9 Hypertension: On beta-blocker per above Hyperlipidemia: Continue home atorvastatin  Diabetes type 2: Hold home glipizide . OSA: BiPAP nightly Gout: Continue home allopurinol    Pressure injury stage I left buttocks present on admission intact skin with nonblanchable redness over bony prominence     Pressure Injury 11/22/23 Buttocks Left Stage 1 -  Intact skin with non-blanchable redness of a localized area usually over a bony prominence. unblanchable, redness (Active)  11/22/23 0102  Location: Buttocks  Location Orientation: Left  Staging: Stage 1 -  Intact skin with non-blanchable redness of a localized area usually over a bony prominence.  Wound Description (Comments): unblanchable, redness  Present on Admission: Yes  Dressing Type Foam - Lift dressing to assess site every shift 11/25/23 1216    Estimated body mass index is 37.21 kg/m as calculated from the following:   Height as of this encounter: 5\' 8"  (1.727 m).   Weight as of this encounter: 111 kg.  Discharge Instructions  Discharge Instructions     Diet - low sodium heart healthy   Complete by: As directed    Increase activity slowly   Complete by: As directed    No wound care   Complete by: As directed       Allergies as of 11/25/2023       Reactions   Codeine    Altered mental status "goofy"   Heparin  Rash    Abdominal rash 01/2013        Medication List     TAKE these medications    acetaminophen  500 MG tablet Commonly known as: TYLENOL  Take 500-1,000 mg by mouth every 6 (six) hours as needed for moderate pain (pain score 4-6).   allopurinol  100 MG  tablet Commonly known as: ZYLOPRIM  Take 1 tablet (100 mg total) by mouth daily.   ammonium lactate 12 % lotion Commonly known as: LAC-HYDRIN Apply 1 Application topically as needed for dry skin.   amoxicillin -clavulanate 875-125 MG tablet Commonly known as: AUGMENTIN  Take 1 tablet by mouth every 12 (twelve) hours.    apixaban  5 MG Tabs tablet Commonly known as: Eliquis  Take 1 tablet (5 mg total) by mouth 2 (two) times daily.   atorvastatin  20 MG tablet Commonly known as: LIPITOR Take 1 tablet (20 mg total) by mouth daily.   cholecalciferol  25 MCG (1000 UNIT) tablet Commonly known as: VITAMIN D3 Take 2,000 Units by mouth daily.   cyanocobalamin  1000 MCG/ML injection Commonly known as: VITAMIN B12 Inject 1 mL (1,000 mcg total) into the muscle every 30 (thirty) days.   glipiZIDE  10 MG 24 hr tablet Commonly known as: GLUCOTROL  XL TAKE 1 TABLET(10 MG) BY MOUTH DAILY WITH BREAKFAST   metoprolol  tartrate 25 MG tablet Commonly known as: LOPRESSOR  TAKE 1 TABLET(25 MG) BY MOUTH TWICE DAILY   nystatin  cream Commonly known as: MYCOSTATIN  Apply 1 Application topically 2 (two) times daily.   nystatin  powder Commonly known as: MYCOSTATIN /NYSTOP  Apply 1 Application topically 2 (two) times daily.   OXYGEN  Inhale 3-5 L into the lungs daily. 3L Daily  4L Resting  5L Exertion   polyethylene glycol 17 g packet Commonly known as: MIRALAX  / GLYCOLAX  Take 17 g by mouth daily as needed.   spironolactone  25 MG tablet Commonly known as: ALDACTONE  Take 1 tablet (25 mg total) by mouth daily.   tamsulosin  0.4 MG Caps capsule Commonly known as: FLOMAX  TAKE 1 CAPSULE BY MOUTH EVERY DAY AFTER SUPPER   torsemide  20 MG tablet Commonly known as: DEMADEX  Take 1 tablet (20 mg total) by mouth daily.        Follow-up Information     [facility] [place], [place] Follow up.   Why: [facility] will follow up with you at discharge to provide home health services Contact information: [address] [place] [address] [phone]         Roslyn Coombe, MD Follow up.   Specialties: Internal Medicine, Radiology Contact information: [address] [place] [address] [phone]                Allergies  Allergen Reactions   Codeine     Altered  mental status "goofy"   Heparin  Rash     Abdominal rash 01/2013    Consultations:none   Procedures/Studies: CT ABDOMEN PELVIS W WO CONTRAST Result Date: 11/23/2023 CLINICAL DATA:  Complicated UTI.  Abdominal pain. EXAM: CT ABDOMEN AND PELVIS WITHOUT AND WITH CONTRAST TECHNIQUE: Multidetector CT imaging of the abdomen and pelvis was performed following the standard protocol before and following the bolus administration of intravenous contrast. RADIATION DOSE REDUCTION: This exam was performed according to the departmental dose-optimization program which includes automated exposure control, adjustment of the mA and/or kV according to patient size and/or use of iterative reconstruction technique. CONTRAST:  80mL OMNIPAQUE  IOHEXOL  300 MG/ML  SOLN COMPARISON:  01/12/2023 FINDINGS: Lower chest: Subsegmental atelectasis noted in the lung bases. Hepatobiliary: A tiny hypodensity in the liver parenchyma is too small to characterize but is statistically most likely benign. Scattered tiny hypodensities in the liver parenchyma are too small to characterize but are statistically most likely benign. No followup imaging is recommended. There is no evidence for gallstones, gallbladder wall thickening, or pericholecystic fluid. No  intrahepatic or extrahepatic biliary dilation. Pancreas: No focal mass lesion. No dilatation of the main duct. No intraparenchymal cyst. No peripancreatic edema. Spleen: No splenomegaly. No suspicious focal mass lesion. Adrenals/Urinary Tract: 14 mm right adrenal nodule cannot be definitively characterized but is stable since prior. 2 cm left adrenal nodule is stable but cannot be definitively characterized. Mild atrophy noted right kidney. 3.1 cm simple cyst noted upper pole left kidney. Additional tiny hypo attenuating lesions in the left kidney are too small to characterize but stable in the interval and likely benign. No followup imaging is recommended. No evidence for hydroureter. Mild  circumferential bladder wall thickening noted with ill definition of the bladder wall. There is a Foley catheter in place with balloon of Foley catheter inflated in the posterior urethra. Stomach/Bowel: Stomach is unremarkable. No gastric wall thickening. No evidence of outlet obstruction. Duodenum is normally positioned as is the ligament of Treitz. No small bowel wall thickening. No small bowel dilatation. The terminal ileum is normal. The appendix is not well visualized, but there is no edema or inflammation in the region of the cecal tip to suggest appendicitis. No gross colonic mass. No colonic wall thickening. Diverticular changes are noted in the left colon without evidence of diverticulitis. Vascular/Lymphatic: There is mild atherosclerotic calcification of the abdominal aorta without aneurysm. There is no gastrohepatic or hepatoduodenal ligament lymphadenopathy. No retroperitoneal or mesenteric lymphadenopathy. No pelvic sidewall lymphadenopathy. Reproductive: The prostate gland and seminal vesicles are unremarkable. Other: No intraperitoneal free fluid. Musculoskeletal: No worrisome lytic or sclerotic osseous abnormality. Thoracolumbar fusion hardware evident. IMPRESSION: 1. Foley catheter in place with balloon of Foley catheter inflated in the posterior urethra. Repositioning recommended. 2. Mild circumferential bladder wall thickening with ill definition of the bladder wall. Imaging features concerning for acute cystitis. 3. Left colonic diverticulosis without diverticulitis. 4. Stable bilateral adrenal nodules, indeterminate but stable in the nearly 1 year interval since prior study. Right adrenal nodule is stable comparing back to chest CTA 02/09/2008 although left adrenal gland was not included on that study. Likely benign lipid poor adenomas, no further imaging follow-up recommended. 5.  Aortic Atherosclerois (ICD10-170.0) These results will be called to the ordering clinician or representative by  the Radiologist Assistant, and communication documented in the PACS or [employer]. Electronically Signed   By: Donnal Fusi M.D.   On: 11/23/2023 06:28   DG Chest Port 1 View Result Date: 11/21/2023 CLINICAL DATA:  Possible sepsis EXAM: PORTABLE CHEST 1 VIEW COMPARISON:  01/30/2023 FINDINGS: Cardiac shadow is enlarged but stable. Aortic calcifications are noted. The lungs are well aerated bilaterally. Mild left basilar atelectasis is seen. No bony abnormality is noted. IMPRESSION: Mild left basilar atelectasis is noted. Electronically Signed   By: Violeta Grey M.D.   On: 11/21/2023 21:24   (Echo, Carotid, EGD, Colonoscopy, ERCP)    Subjective:  Resting in bed anxious to go home Discharge Exam: Vitals:   11/25/23 0923 11/25/23 1014  BP:  115/79  Pulse:  84  Resp: (!) 24   Temp:  98.4 F (36.9 C)  SpO2:  98%   Vitals:   11/24/23 2038 11/25/23 0506 11/25/23 0923 11/25/23 1014  BP: (!) 98/53 122/77  115/79  Pulse: 67 74  84  Resp:  18 (!) 24   Temp:  97.7 F (36.5 C)  98.4 F (36.9 C)  TempSrc:    Oral  SpO2: 99% 99%  98%  Weight:      Height:  General: Pt is alert, awake, not in acute distress Cardiovascular: RRR, S1/S2 +, no rubs, no gallops Respiratory: CTA bilaterally, no wheezing, no rhonchi Abdominal: Soft, NT, ND, bowel sounds + Extremities: no edema, no cyanosis    The results of significant diagnostics from this hospitalization (including imaging, microbiology, ancillary and laboratory) are listed below for reference.     Microbiology: Recent Results (from the past 240 hours)  Blood Culture (routine x 2)     Status: Abnormal   Collection Time: 11/21/23  8:32 PM   Specimen: BLOOD RIGHT HAND  Result Value Ref Range Status   Specimen Description   Final    BLOOD RIGHT HAND Performed at [facility], [address] [place], [place] [address]    Special Requests   Final    BOTTLES DRAWN AEROBIC AND ANAEROBIC Blood Culture  adequate volume Performed at [facility], [address], [place], [place] [address]    Culture  Setup Time   Final    GRAM NEGATIVE RODS IN BOTH AEROBIC AND ANAEROBIC BOTTLES CRITICAL RESULT CALLED TO, READ BACK BY AND VERIFIED WITH: PHARMD DREW WOFFORD ON 11/22/23 @ 1932 BY DRT Performed at [facility], [address] [place], [place] [address]    Culture PROTEUS MIRABILIS (A)  Final   Report Status 11/24/2023 FINAL  Final   Organism ID, Bacteria PROTEUS MIRABILIS  Final   Organism ID, Bacteria PROTEUS MIRABILIS  Final      Susceptibility   Proteus mirabilis - KIRBY BAUER*    CEFAZOLIN  INTERMEDIATE Intermediate    Proteus mirabilis - MIC*    AMPICILLIN <=2 SENSITIVE Sensitive     CEFEPIME  <=0.12 SENSITIVE Sensitive     CEFTAZIDIME <=1 SENSITIVE Sensitive     CEFTRIAXONE  <=0.25 SENSITIVE Sensitive     CIPROFLOXACIN  <=0.25 SENSITIVE Sensitive     GENTAMICIN <=1 SENSITIVE Sensitive     IMIPENEM 4 SENSITIVE Sensitive     TRIMETH /SULFA  <=20 SENSITIVE Sensitive     AMPICILLIN/SULBACTAM <=2 SENSITIVE Sensitive     PIP/TAZO <=4 SENSITIVE Sensitive ug/mL    * PROTEUS MIRABILIS    PROTEUS MIRABILIS  Blood Culture ID Panel (Reflexed)     Status: Abnormal   Collection Time: 11/21/23  8:32 PM  Result Value Ref Range Status   Enterococcus faecalis NOT DETECTED NOT DETECTED Final   Enterococcus Faecium NOT DETECTED NOT DETECTED Final   Listeria monocytogenes NOT DETECTED NOT DETECTED Final   Staphylococcus species NOT DETECTED NOT DETECTED Final   Staphylococcus aureus (BCID) NOT DETECTED NOT DETECTED Final   Staphylococcus epidermidis NOT DETECTED NOT DETECTED Final   Staphylococcus lugdunensis NOT DETECTED NOT DETECTED Final   Streptococcus species NOT DETECTED NOT DETECTED Final   Streptococcus agalactiae NOT DETECTED NOT DETECTED Final   Streptococcus pneumoniae NOT DETECTED NOT DETECTED Final   Streptococcus pyogenes NOT DETECTED NOT DETECTED Final    A.calcoaceticus-baumannii NOT DETECTED NOT DETECTED Final   Bacteroides fragilis NOT DETECTED NOT DETECTED Final   Enterobacterales DETECTED (A) NOT DETECTED Final    Comment: Enterobacterales represent a large order of gram negative bacteria, not a single organism. CRITICAL RESULT CALLED TO, READ BACK BY AND VERIFIED WITH: PHARMD DREW WOFFORD ON 11/22/23 @ 1932 BY DRT    Enterobacter cloacae complex NOT DETECTED NOT DETECTED Final   Escherichia coli NOT DETECTED NOT DETECTED Final   Klebsiella aerogenes NOT DETECTED NOT DETECTED Final   Klebsiella oxytoca NOT DETECTED NOT DETECTED Final   Klebsiella pneumoniae NOT DETECTED NOT DETECTED  Final   Proteus species DETECTED (A) NOT DETECTED Final    Comment: CRITICAL RESULT CALLED TO, READ BACK BY AND VERIFIED WITH: PHARMD DREW WOFFORD ON 11/22/23 @ 1932 BY DRT    Salmonella species NOT DETECTED NOT DETECTED Final   Serratia marcescens NOT DETECTED NOT DETECTED Final   Haemophilus influenzae NOT DETECTED NOT DETECTED Final   Neisseria meningitidis NOT DETECTED NOT DETECTED Final   Pseudomonas aeruginosa NOT DETECTED NOT DETECTED Final   Stenotrophomonas maltophilia NOT DETECTED NOT DETECTED Final   Candida albicans NOT DETECTED NOT DETECTED Final   Candida auris NOT DETECTED NOT DETECTED Final   Candida glabrata NOT DETECTED NOT DETECTED Final   Candida krusei NOT DETECTED NOT DETECTED Final   Candida parapsilosis NOT DETECTED NOT DETECTED Final   Candida tropicalis NOT DETECTED NOT DETECTED Final   Cryptococcus neoformans/gattii NOT DETECTED NOT DETECTED Final   CTX-M ESBL NOT DETECTED NOT DETECTED Final   Carbapenem resistance IMP NOT DETECTED NOT DETECTED Final   Carbapenem resistance KPC NOT DETECTED NOT DETECTED Final   Carbapenem resistance NDM NOT DETECTED NOT DETECTED Final   Carbapenem resist OXA 48 LIKE NOT DETECTED NOT DETECTED Final   Carbapenem resistance VIM NOT DETECTED NOT DETECTED Final    Comment: Performed at [facility], [address], [place], [place] [address]  Resp panel by RT-PCR (RSV, Flu A&B, Covid) Peripheral     Status: None   Collection Time: 11/21/23  8:33 PM   Specimen: Peripheral; Nasal Swab  Result Value Ref Range Status   SARS Coronavirus 2 by RT PCR NEGATIVE NEGATIVE Final    Comment: (NOTE) SARS-CoV-2 target nucleic acids are NOT DETECTED.  The SARS-CoV-2 RNA is generally detectable in upper respiratory specimens during the acute phase of infection. The lowest concentration of SARS-CoV-2 viral copies this assay can detect is 138 copies/mL. A negative result does not preclude SARS-Cov-2 infection and should not be used as the sole basis for treatment or other patient management decisions. A negative result may occur with  improper specimen collection/handling, submission of specimen other than nasopharyngeal swab, presence of viral mutation(s) within the areas targeted by this assay, and inadequate number of viral copies(<138 copies/mL). A negative result must be combined with clinical observations, patient history, and epidemiological information. The expected result is Negative.  Fact Sheet for Patients:  BloggerCourse.com  Fact Sheet for Healthcare Providers:  SeriousBroker.it  This test is no t yet approved or cleared by the [place]  FDA and  has been authorized for detection and/or diagnosis of SARS-CoV-2 by FDA under an Emergency Use Authorization (EUA). This EUA will remain  in effect (meaning this test can be used) for the duration of the COVID-19 declaration under Section 564(b)(1) of the Act, 21 U.S.C.section 360bbb-3(b)(1), unless the authorization is terminated  or revoked sooner.       Influenza A by PCR NEGATIVE NEGATIVE Final   Influenza B by PCR NEGATIVE NEGATIVE Final    Comment: (NOTE) The Xpert Xpress SARS-CoV-2/FLU/RSV plus assay is intended as an aid in the diagnosis of influenza from  Nasopharyngeal swab specimens and should not be used as a sole basis for treatment. Nasal washings and aspirates are unacceptable for Xpert Xpress SARS-CoV-2/FLU/RSV testing.  Fact Sheet for Patients: BloggerCourse.com  Fact Sheet for Healthcare Providers: SeriousBroker.it  This test is not yet approved or cleared by the [place]  FDA and has been authorized for detection and/or diagnosis of SARS-CoV-2 by FDA under an Emergency Use Authorization (EUA).  This EUA will remain in effect (meaning this test can be used) for the duration of the COVID-19 declaration under Section 564(b)(1) of the Act, 21 U.S.C. section 360bbb-3(b)(1), unless the authorization is terminated or revoked.     Resp Syncytial Virus by PCR NEGATIVE NEGATIVE Final    Comment: (NOTE) Fact Sheet for Patients: BloggerCourse.com  Fact Sheet for Healthcare Providers: SeriousBroker.it  This test is not yet approved or cleared by the [place]  FDA and has been authorized for detection and/or diagnosis of SARS-CoV-2 by FDA under an Emergency Use Authorization (EUA). This EUA will remain in effect (meaning this test can be used) for the duration of the COVID-19 declaration under Section 564(b)(1) of the Act, 21 U.S.C. section 360bbb-3(b)(1), unless the authorization is terminated or revoked.  Performed at [facility], [address] [place], [place] [address]   Urine Culture     Status: Abnormal   Collection Time: 11/21/23  8:33 PM   Specimen: Urine, Random  Result Value Ref Range Status   Specimen Description   Final    URINE, RANDOM Performed at [facility], [address], [place], [place] [address]    Special Requests   Final    NONE Reflexed from [id number] Performed at [facility], [address], [place], [place] [address]    Culture  80,000 COLONIES/mL PROTEUS MIRABILIS (A)  Final   Report Status 11/24/2023 FINAL  Final   Organism ID, Bacteria PROTEUS MIRABILIS (A)  Final      Susceptibility   Proteus mirabilis - MIC*    AMPICILLIN <=2 SENSITIVE Sensitive     CEFAZOLIN  <=4 SENSITIVE Sensitive     CEFEPIME  <=0.12 SENSITIVE Sensitive     CEFTRIAXONE  <=0.25 SENSITIVE Sensitive     CIPROFLOXACIN  <=0.25 SENSITIVE Sensitive     GENTAMICIN <=1 SENSITIVE Sensitive     IMIPENEM 2 SENSITIVE Sensitive     NITROFURANTOIN 128 RESISTANT Resistant     TRIMETH /SULFA  <=20 SENSITIVE Sensitive     AMPICILLIN/SULBACTAM <=2 SENSITIVE Sensitive     PIP/TAZO <=4 SENSITIVE Sensitive ug/mL    * 80,000 COLONIES/mL PROTEUS MIRABILIS  Blood Culture (routine x 2)     Status: Abnormal   Collection Time: 11/21/23  9:10 PM   Specimen: BLOOD LEFT ARM  Result Value Ref Range Status   Specimen Description   Final    BLOOD LEFT ARM Performed at [facility], [address], [place], [place] [address]    Special Requests   Final    BOTTLES DRAWN AEROBIC AND ANAEROBIC Blood Culture results may not be optimal due to an inadequate volume of blood received in culture bottles Performed at [facility], [address], [place], [place] [address]    Culture  Setup Time   Final    GRAM NEGATIVE RODS AEROBIC BOTTLE ONLY CRITICAL VALUE NOTED.  VALUE IS CONSISTENT WITH PREVIOUSLY REPORTED AND CALLED VALUE.    Culture (A)  Final    PROTEUS MIRABILIS SUSCEPTIBILITIES PERFORMED ON PREVIOUS CULTURE WITHIN THE LAST 5 DAYS. Performed at [facility], [address], [place], [place] [address]    Report Status 11/24/2023 FINAL  Final     Labs: BNP (last 3 results) Recent Labs    01/30/23 2220 02/22/23 1244  BNP 230.3* 75.5   Basic Metabolic Panel: Recent Labs  Lab 11/21/23 2033 11/22/23 0535 11/23/23 0530  NA 140 141 135  K 5.0 4.3 4.4  CL 105 110 105  CO2 26 22 22  GLUCOSE 153* 103* 100*  BUN  45* 43* 34*  CREATININE 1.97* 1.91* 1.91*  CALCIUM  9.8 8.9 8.8*  MG  --  1.8  --   PHOS  --  3.1  --    Liver Function Tests: Recent Labs  Lab 11/21/23 2033  AST 15  ALT 12  ALKPHOS 64  BILITOT 1.3*  PROT 7.3  ALBUMIN  3.7   No results for input(s): "LIPASE", "AMYLASE" in the last 168 hours. No results for input(s): "AMMONIA" in the last 168 hours. CBC: Recent Labs  Lab 11/21/23 2033 11/22/23 0535 11/23/23 0530  WBC 14.8* 13.8* 9.3  NEUTROABS 12.5*  --   --   HGB 12.9* 11.6* 11.6*  HCT 39.4 37.4* 37.1*  MCV 88.7 92.6 92.5  PLT 221 173 179   Cardiac Enzymes: No results for input(s): "CKTOTAL", "CKMB", "CKMBINDEX", "TROPONINI" in the last 168 hours. BNP: Invalid input(s): "POCBNP" CBG: Recent Labs  Lab 11/24/23 1152 11/24/23 1714 11/24/23 2030 11/25/23 0729 11/25/23 1113  GLUCAP 149* 114* 134* 130* 114*   D-Dimer No results for input(s): "DDIMER" in the last 72 hours. Hgb A1c No results for input(s): "HGBA1C" in the last 72 hours. Lipid Profile No results for input(s): "CHOL", "HDL", "LDLCALC", "TRIG", "CHOLHDL", "LDLDIRECT" in the last 72 hours. Thyroid  function studies No results for input(s): "TSH", "T4TOTAL", "T3FREE", "THYROIDAB" in the last 72 hours.  Invalid input(s): "FREET3" Anemia work up No results for input(s): "VITAMINB12", "FOLATE", "FERRITIN", "TIBC", "IRON", "RETICCTPCT" in the last 72 hours. Urinalysis    Component Value Date/Time   COLORURINE YELLOW 11/21/2023 2033   APPEARANCEUR CLOUDY (A) 11/21/2023 2033   APPEARANCEUR Hazy (A) 04/14/2023 1054   LABSPEC 1.012 11/21/2023 2033   PHURINE 7.0 11/21/2023 2033   GLUCOSEU NEGATIVE 11/21/2023 2033   GLUCOSEU NEGATIVE 10/08/2022 1611   HGBUR LARGE (A) 11/21/2023 2033   BILIRUBINUR NEGATIVE 11/21/2023 2033   BILIRUBINUR Negative 04/14/2023 1054   KETONESUR NEGATIVE 11/21/2023 2033   PROTEINUR 30 (A) 11/21/2023 2033   UROBILINOGEN 0.2 10/08/2022 1611   NITRITE NEGATIVE 11/21/2023 2033    LEUKOCYTESUR LARGE (A) 11/21/2023 2033   Sepsis Labs Recent Labs  Lab 11/21/23 2033 11/22/23 0535 11/23/23 0530  WBC 14.8* 13.8* 9.3   Microbiology Recent Results (from the past 240 hours)  Blood Culture (routine x 2)     Status: Abnormal   Collection Time: 11/21/23  8:32 PM   Specimen: BLOOD RIGHT HAND  Result Value Ref Range Status   Specimen Description   Final    BLOOD RIGHT HAND Performed at [facility], [address] [place], [place] [address]    Special Requests   Final    BOTTLES DRAWN AEROBIC AND ANAEROBIC Blood Culture adequate volume Performed at [facility], [address], [place], [place] [address]    Culture  Setup Time   Final    GRAM NEGATIVE RODS IN BOTH AEROBIC AND ANAEROBIC BOTTLES CRITICAL RESULT CALLED TO, READ BACK BY AND VERIFIED WITH: PHARMD DREW WOFFORD ON 11/22/23 @ 1932 BY DRT Performed at [facility], [address] [place], [place] [address]    Culture PROTEUS MIRABILIS (A)  Final   Report Status 11/24/2023 FINAL  Final   Organism ID, Bacteria PROTEUS MIRABILIS  Final   Organism ID, Bacteria PROTEUS MIRABILIS  Final      Susceptibility   Proteus mirabilis - KIRBY BAUER*    CEFAZOLIN  INTERMEDIATE Intermediate    Proteus mirabilis - MIC*    AMPICILLIN <=2 SENSITIVE  Sensitive     CEFEPIME  <=0.12 SENSITIVE Sensitive     CEFTAZIDIME <=1 SENSITIVE Sensitive     CEFTRIAXONE  <=0.25 SENSITIVE Sensitive     CIPROFLOXACIN  <=0.25 SENSITIVE Sensitive     GENTAMICIN <=1 SENSITIVE Sensitive     IMIPENEM 4 SENSITIVE Sensitive     TRIMETH /SULFA  <=20 SENSITIVE Sensitive     AMPICILLIN/SULBACTAM <=2 SENSITIVE Sensitive     PIP/TAZO <=4 SENSITIVE Sensitive ug/mL    * PROTEUS MIRABILIS    PROTEUS MIRABILIS  Blood Culture ID Panel (Reflexed)     Status: Abnormal   Collection Time: 11/21/23  8:32 PM  Result Value Ref Range Status   Enterococcus faecalis NOT DETECTED NOT DETECTED Final   Enterococcus  Faecium NOT DETECTED NOT DETECTED Final   Listeria monocytogenes NOT DETECTED NOT DETECTED Final   Staphylococcus species NOT DETECTED NOT DETECTED Final   Staphylococcus aureus (BCID) NOT DETECTED NOT DETECTED Final   Staphylococcus epidermidis NOT DETECTED NOT DETECTED Final   Staphylococcus lugdunensis NOT DETECTED NOT DETECTED Final   Streptococcus species NOT DETECTED NOT DETECTED Final   Streptococcus agalactiae NOT DETECTED NOT DETECTED Final   Streptococcus pneumoniae NOT DETECTED NOT DETECTED Final   Streptococcus pyogenes NOT DETECTED NOT DETECTED Final   A.calcoaceticus-baumannii NOT DETECTED NOT DETECTED Final   Bacteroides fragilis NOT DETECTED NOT DETECTED Final   Enterobacterales DETECTED (A) NOT DETECTED Final    Comment: Enterobacterales represent a large order of gram negative bacteria, not a single organism. CRITICAL RESULT CALLED TO, READ BACK BY AND VERIFIED WITH: PHARMD DREW WOFFORD ON 11/22/23 @ 1932 BY DRT    Enterobacter cloacae complex NOT DETECTED NOT DETECTED Final   Escherichia coli NOT DETECTED NOT DETECTED Final   Klebsiella aerogenes NOT DETECTED NOT DETECTED Final   Klebsiella oxytoca NOT DETECTED NOT DETECTED Final   Klebsiella pneumoniae NOT DETECTED NOT DETECTED Final   Proteus species DETECTED (A) NOT DETECTED Final    Comment: CRITICAL RESULT CALLED TO, READ BACK BY AND VERIFIED WITH: PHARMD DREW WOFFORD ON 11/22/23 @ 1932 BY DRT    Salmonella species NOT DETECTED NOT DETECTED Final   Serratia marcescens NOT DETECTED NOT DETECTED Final   Haemophilus influenzae NOT DETECTED NOT DETECTED Final   Neisseria meningitidis NOT DETECTED NOT DETECTED Final   Pseudomonas aeruginosa NOT DETECTED NOT DETECTED Final   Stenotrophomonas maltophilia NOT DETECTED NOT DETECTED Final   Candida albicans NOT DETECTED NOT DETECTED Final   Candida auris NOT DETECTED NOT DETECTED Final   Candida glabrata NOT DETECTED NOT DETECTED Final   Candida krusei NOT DETECTED NOT  DETECTED Final   Candida parapsilosis NOT DETECTED NOT DETECTED Final   Candida tropicalis NOT DETECTED NOT DETECTED Final   Cryptococcus neoformans/gattii NOT DETECTED NOT DETECTED Final   CTX-M ESBL NOT DETECTED NOT DETECTED Final   Carbapenem resistance IMP NOT DETECTED NOT DETECTED Final   Carbapenem resistance KPC NOT DETECTED NOT DETECTED Final   Carbapenem resistance NDM NOT DETECTED NOT DETECTED Final   Carbapenem resist OXA 48 LIKE NOT DETECTED NOT DETECTED Final   Carbapenem resistance VIM NOT DETECTED NOT DETECTED Final    Comment: Performed at [facility], [address], [place], [place] [address]  Resp panel by RT-PCR (RSV, Flu A&B, Covid) Peripheral     Status: None   Collection Time: 11/21/23  8:33 PM   Specimen: Peripheral; Nasal Swab  Result Value Ref Range Status   SARS Coronavirus 2 by RT PCR NEGATIVE NEGATIVE Final    Comment: (  NOTE) SARS-CoV-2 target nucleic acids are NOT DETECTED.  The SARS-CoV-2 RNA is generally detectable in upper respiratory specimens during the acute phase of infection. The lowest concentration of SARS-CoV-2 viral copies this assay can detect is 138 copies/mL. A negative result does not preclude SARS-Cov-2 infection and should not be used as the sole basis for treatment or other patient management decisions. A negative result may occur with  improper specimen collection/handling, submission of specimen other than nasopharyngeal swab, presence of viral mutation(s) within the areas targeted by this assay, and inadequate number of viral copies(<138 copies/mL). A negative result must be combined with clinical observations, patient history, and epidemiological information. The expected result is Negative.  Fact Sheet for Patients:  BloggerCourse.com  Fact Sheet for Healthcare Providers:  SeriousBroker.it  This test is no t yet approved or cleared by the [place]  FDA and  has been  authorized for detection and/or diagnosis of SARS-CoV-2 by FDA under an Emergency Use Authorization (EUA). This EUA will remain  in effect (meaning this test can be used) for the duration of the COVID-19 declaration under Section 564(b)(1) of the Act, 21 U.S.C.section 360bbb-3(b)(1), unless the authorization is terminated  or revoked sooner.       Influenza A by PCR NEGATIVE NEGATIVE Final   Influenza B by PCR NEGATIVE NEGATIVE Final    Comment: (NOTE) The Xpert Xpress SARS-CoV-2/FLU/RSV plus assay is intended as an aid in the diagnosis of influenza from Nasopharyngeal swab specimens and should not be used as a sole basis for treatment. Nasal washings and aspirates are unacceptable for Xpert Xpress SARS-CoV-2/FLU/RSV testing.  Fact Sheet for Patients: BloggerCourse.com  Fact Sheet for Healthcare Providers: SeriousBroker.it  This test is not yet approved or cleared by the [place]  FDA and has been authorized for detection and/or diagnosis of SARS-CoV-2 by FDA under an Emergency Use Authorization (EUA). This EUA will remain in effect (meaning this test can be used) for the duration of the COVID-19 declaration under Section 564(b)(1) of the Act, 21 U.S.C. section 360bbb-3(b)(1), unless the authorization is terminated or revoked.     Resp Syncytial Virus by PCR NEGATIVE NEGATIVE Final    Comment: (NOTE) Fact Sheet for Patients: BloggerCourse.com  Fact Sheet for Healthcare Providers: SeriousBroker.it  This test is not yet approved or cleared by the [place]  FDA and has been authorized for detection and/or diagnosis of SARS-CoV-2 by FDA under an Emergency Use Authorization (EUA). This EUA will remain in effect (meaning this test can be used) for the duration of the COVID-19 declaration under Section 564(b)(1) of the Act, 21 U.S.C. section 360bbb-3(b)(1), unless the  authorization is terminated or revoked.  Performed at [facility], [address] [place], [place] [address]   Urine Culture     Status: Abnormal   Collection Time: 11/21/23  8:33 PM   Specimen: Urine, Random  Result Value Ref Range Status   Specimen Description   Final    URINE, RANDOM Performed at [facility], [address], [place], [place] [address]    Special Requests   Final    NONE Reflexed from [id number] Performed at [facility], [address], [place], [place] [address]    Culture 80,000 COLONIES/mL PROTEUS MIRABILIS (A)  Final   Report Status 11/24/2023 FINAL  Final   Organism ID, Bacteria PROTEUS MIRABILIS (A)  Final      Susceptibility   Proteus mirabilis - MIC*    AMPICILLIN <=2 SENSITIVE Sensitive     CEFAZOLIN  <=4  SENSITIVE Sensitive     CEFEPIME  <=0.12 SENSITIVE Sensitive     CEFTRIAXONE  <=0.25 SENSITIVE Sensitive     CIPROFLOXACIN  <=0.25 SENSITIVE Sensitive     GENTAMICIN <=1 SENSITIVE Sensitive     IMIPENEM 2 SENSITIVE Sensitive     NITROFURANTOIN 128 RESISTANT Resistant     TRIMETH /SULFA  <=20 SENSITIVE Sensitive     AMPICILLIN/SULBACTAM <=2 SENSITIVE Sensitive     PIP/TAZO <=4 SENSITIVE Sensitive ug/mL    * 80,000 COLONIES/mL PROTEUS MIRABILIS  Blood Culture (routine x 2)     Status: Abnormal   Collection Time: 11/21/23  9:10 PM   Specimen: BLOOD LEFT ARM  Result Value Ref Range Status   Specimen Description   Final    BLOOD LEFT ARM Performed at [facility], [address] [place], [place] [address]    Special Requests   Final    BOTTLES DRAWN AEROBIC AND ANAEROBIC Blood Culture results may not be optimal due to an inadequate volume of blood received in culture bottles Performed at [facility], [address], [place], [place] [address]    Culture  Setup Time   Final    GRAM NEGATIVE RODS AEROBIC BOTTLE ONLY CRITICAL VALUE NOTED.  VALUE IS  CONSISTENT WITH PREVIOUSLY REPORTED AND CALLED VALUE.    Culture (A)  Final    PROTEUS MIRABILIS SUSCEPTIBILITIES PERFORMED ON PREVIOUS CULTURE WITHIN THE LAST 5 DAYS. Performed at [facility], [address] [place], [place] [address]    Report Status 11/24/2023 FINAL  Final     Time coordinating discharge: 39 minutes  SIGNED:   Barbee Lew, MD  [facility] 11/25/2023, 4:54 PM

## 2023-11-25 NOTE — TOC Transition Note (Signed)
 Transition of Care [facility]) - Discharge Note   Patient Details  Name: [name] [name] MRN: [id number] Date of Birth: Dec 31, 1945  Transition of Care [facility]) CM/SW Contact:  Hoy DELENA Bigness, LCSW Phone Number: 11/25/2023, 11:37 AM   Clinical Narrative:    Pt to return home with daughter at discharge. Pt to receive HHPT/OT/RN through [employer]. Pt's daughter to bring travel O2 tank for pt at discharge and is to provide transportation home. No further TOC needs identified.    Final next level of care: Home w Home Health Services Barriers to Discharge: No Barriers Identified   Patient Goals and CMS Choice Patient states their goals for this hospitalization and ongoing recovery are:: To return home CMS Medicare.gov Compare Post Acute Care list provided to:: Patient Choice offered to / list presented to : Patient [place] ownership interest in [facility].provided to::  (NA)    Discharge Placement                       Discharge Plan and Services Additional resources added to the After Visit Summary for   In-house Referral: Clinical Social Work Discharge Planning Services: NA Post Acute Care Choice: Resumption of Svcs/PTA Provider          DME Arranged: N/A DME Agency: NA       HH Arranged: RN, PT, OT HH Agency: [facility] Date HH Agency Contacted: 11/23/23 Time HH Agency Contacted: 1420 Representative spoke with at [facility] Agency: Jon  Social Drivers of Health (SDOH) Interventions SDOH Screenings   Food Insecurity: No Food Insecurity (11/22/2023)  Housing: High Risk (11/22/2023)  Transportation Needs: No Transportation Needs (11/22/2023)  Utilities: Not At Risk (11/22/2023)  Alcohol Screen: Low Risk  (02/11/2023)  Depression (PHQ2-9): Low Risk  (07/01/2023)  Financial Resource Strain: Low Risk  (03/24/2023)  Physical Activity: Unknown (03/24/2023)  Social Connections: Unknown (11/22/2023)  Stress: No Stress Concern Present (07/01/2023)  Tobacco Use: Medium  Risk (11/22/2023)     Readmission Risk Interventions    11/23/2023    2:19 PM 02/15/2023    2:47 PM 01/14/2023    1:35 PM  Readmission Risk Prevention Plan  Transportation Screening Complete Complete Complete  PCP or Specialist Appt within 3-5 Days Complete Complete   HRI or Home Care Consult Complete Complete   Social Work Consult for Recovery Care Planning/Counseling Complete Complete   Palliative Care Screening Not Applicable Complete   Medication Review Oceanographer) Complete Complete Complete  PCP or Specialist appointment within 3-5 days of discharge   Complete  HRI or Home Care Consult   Complete  SW Recovery Care/Counseling Consult   --  Palliative Care Screening   Not Applicable  Skilled Nursing Facility   Not Applicable

## 2023-11-28 ENCOUNTER — Telehealth: Payer: Self-pay | Admitting: *Deleted

## 2023-11-28 NOTE — Transitions of Care (Post Inpatient/ED Visit) (Addendum)
 11/28/2023  Name: MARCIO [name] MRN: [id number] DOB: 11-Mar-1946  Today's TOC FU Call Status: Today's TOC FU Call Status:: Successful TOC FU Call Completed TOC FU Call Complete Date: 11/28/23 Patient's Name and Date of Birth confirmed.  Transition Care Management Follow-up Telephone Call Date of Discharge: 11/25/23 Discharge Facility: [facility]) Type of Discharge: Inpatient Admission Primary Inpatient Discharge Diagnosis:: sepsis secondary to UTI; chronic foley catheter secondary to BPH How have you been since you were released from the hospital?: Better (per Kathy/ daughter- caregiver: "he is doing fine; taking the antibiotics like they told us  to, he is walking around using the walker.  I assist him with everything he needs; he lives with me") Any questions or concerns?: No  Items Reviewed: Did you receive and understand the discharge instructions provided?: Yes (thoroughly reviewed with daughter- caregiver who verbalizes good understanding of same) Medications obtained,verified, and reconciled?: Yes (Medications Reviewed) (Full medication reconciliation/ review completed; no concerns or discrepancies identified; confirmed patient obtained/ is taking all newly Rx'd medications as instructed; caregiver manages medications, denies questions/ concerns around medications today) Any new allergies since your discharge?: No Dietary orders reviewed?: Yes Type of Diet Ordered:: Regular- "we watch salt and carbs and try to keep them minimized" per caregiver Do you have support at home?: Yes People in Home [RPT]: child(ren), adult Name of Support/Comfort Primary Source: Reports resides with caregiver/ daughter; requires supervision/ assistance in self-care activities; daughter assists as/ if needed/ indicated  Medications Reviewed Today: Medications Reviewed Today     Reviewed by Langdon Crosson M, RN (Registered Nurse) on 11/28/23 at 1026  Med List Status: <None>   Medication Order Taking? Sig  Documenting Provider Last Dose Status Informant  acetaminophen  (TYLENOL ) 500 MG tablet [id number] Yes Take 500-1,000 mg by mouth every 6 (six) hours as needed for moderate pain (pain score 4-6). [provider] Taking Active Nursing Home Medication Administration Guide (MAG)  allopurinol  (ZYLOPRIM ) 100 MG tablet [id number] Yes Take 1 tablet (100 mg total) by mouth daily. Roslyn Coombe, MD Taking Active   ammonium lactate (LAC-HYDRIN) 12 % lotion [id number] Yes Apply 1 Application topically as needed for dry skin. [provider] Taking Active   amoxicillin -clavulanate (AUGMENTIN ) 875-125 MG tablet [id number] Yes Take 1 tablet by mouth every 12 (twelve) hours. Barbee Lew, MD Taking Active   apixaban  (ELIQUIS ) 5 MG TABS tablet [id number] Yes Take 1 tablet (5 mg total) by mouth 2 (two) times daily. Lenise Quince, MD Taking Active            Med Note Micah Ade   Tue Nov 22, 2023  8:53 AM) @1800   atorvastatin  (LIPITOR) 20 MG tablet [id number] Yes Take 1 tablet (20 mg total) by mouth daily. Roslyn Coombe, MD Taking Active   cholecalciferol  (VITAMIN D3) 25 MCG (1000 UNIT) tablet [id number] Yes Take 2,000 Units by mouth daily. [provider] Taking Active Nursing Home Medication Administration Guide (MAG)  cyanocobalamin  ((VITAMIN B-12)) injection 1,000 mcg [id number]   Roslyn Coombe, MD  Active            Med Note (Darion Milewski M   Mon Nov 28, 2023 10:03 AM) 11/28/23- Reports currently taking- needs refill, caregiver reports has April 2025 dose and will give "soon"  cyanocobalamin  (VITAMIN B12) 1000 MCG/ML injection [id number] Yes Inject 1 mL (1,000 mcg total) into the muscle every 30 (thirty) days. Roslyn Coombe, MD Taking Active   glipiZIDE  (GLUCOTROL  XL) 10 MG 24 hr tablet  [id number] Yes TAKE 1 TABLET(10 MG) BY MOUTH DAILY WITH BREAKFAST John, James W, MD Taking Active   metoprolol  tartrate (LOPRESSOR ) 25 MG tablet [id number] Yes TAKE 1 TABLET(25 MG) BY MOUTH  TWICE DAILY Roslyn Coombe, MD Taking Active   nystatin  (MYCOSTATIN /NYSTOP ) powder [id number] Yes Apply 1 Application topically 2 (two) times daily. Stoneking, Ponce Brisker., MD Taking Active   nystatin  cream (MYCOSTATIN ) [id number] Yes Apply 1 Application topically 2 (two) times daily. Mellie Sprinkle., MD Taking Active   OXYGEN  [id number] Yes Inhale 3-5 L into the lungs daily. 3L Daily  4L Resting  5L Exertion [provider] Taking Active Nursing Home Medication Administration Guide (MAG)           Med Note Marciano Settles, TAMMY T   Fri Oct 15, 2016  2:56 PM)    polyethylene glycol (MIRALAX  / GLYCOLAX ) 17 g packet [id number] Yes Take 17 g by mouth daily as needed. Deforest Fast, MD Taking Active   spironolactone  (ALDACTONE ) 25 MG tablet [id number] Yes Take 1 tablet (25 mg total) by mouth daily. Lenise Quince, MD Taking Active   tamsulosin  (FLOMAX ) 0.4 MG CAPS capsule [id number] Yes TAKE 1 CAPSULE BY MOUTH EVERY DAY AFTER SUPPER Roslyn Coombe, MD Taking Active   torsemide  (DEMADEX ) 20 MG tablet [id number] Yes Take 1 tablet (20 mg total) by mouth daily. Deforest Fast, MD Taking Active   Med List Note (White, Tonia S, CPhT 01/31/23 6269): [facility]  [id number]           Home Care and Equipment/Supplies: Were Home Health Services Ordered?: Yes Name of Home Health Agency:: [facility]- was already established with patient prior to recent hospital admission;  [phone]: daughter reports she will contact home health agency- reports has good ongoing raport with established home health team Has Agency set up a time to come to your home?: No EMR reviewed for Home Health Orders: Orders present/patient has not received call (refer to CM for follow-up) (Successfully enrolled into 30-day TOC program) Any new equipment or medical supplies ordered?: No  Functional Questionnaire: Do you need assistance with bathing/showering or dressing?: Yes (daughter/ caregiver provides  supervision/ assistance as needed) Do you need assistance with meal preparation?: Yes (daughter-caregiver prepares all meals) Do you need assistance with eating?: No Do you have difficulty maintaining continence: Yes (daughter/ caregiver provides supervision/ assistance as needed; chronic foley catheter; wears incontinence briefs) Do you need assistance with getting out of bed/getting out of a chair/moving?: Yes (daughter/ caregiver provides supervision/ assistance as needed: uses walker "all the time" at baseline) Do you have difficulty managing or taking your medications?: Yes (daughter/ caregiver manages all aspects of medication administration)  Follow up appointments reviewed: PCP Follow-up appointment confirmed?: Yes (care coordination outreach in real-time with scheduling care guide to successfully schedule hospital follow up PCP appointment) Date of PCP follow-up appointment?: 11/29/23 Follow-up Provider: PCP- Dr. Olena Bernard Follow-up appointment confirmed?: No (urology provider) Reason Specialist Follow-Up Not Confirmed: Patient has Specialist Provider Number and will Call for Appointment Do you need transportation to your follow-up appointment?: No Do you understand care options if your condition(s) worsen?: Yes-patient verbalized understanding  SDOH Interventions Today    Flowsheet Row Most Recent Value  SDOH Interventions   Food Insecurity Interventions Intervention Not Indicated  Housing Interventions Intervention Not Indicated  Transportation Interventions Intervention Not Indicated  [daughter provides all transportation]  Utilities Interventions Intervention Not Indicated      Plan for next week's call:  Review  home health visits- ensure [facility] has resumed home visits  Review PCP provider office visit/ any available new lab results/ new medications- instructions from 11/29/23 hospital office visit  Review medication adherence  Reinforce/ provide  education re: signs/ symptoms UTI along with action plan in setting of chronic foley  Reinforce skin care/ foley catheter care/ need to avoid pressure on bottom:  pressure sore prevention  See TOC assessment tabs for additional assessment/ TOC intervention information  Total time spent from review to signing of note/ including any care coordination interventions:  80 minutes  Pls call/ message for questions,  Odie Edmonds Mckinney Deidrick Rainey, RN, BSN, Media planner  Transitions of Care  [facility] [phone]: direct office

## 2023-11-28 NOTE — Patient Instructions (Signed)
 Visit Information  Thank you for taking time to visit with me today. Please don't hesitate to contact me if I can be of assistance to you before our next scheduled telephone appointment.  Our next appointment is by telephone on Wednesday 12/06/33 at 12:45 pm with [place] The, on Wednesday 12/14/23- phone call with Kellsie Grindle at 1:15 pm  Please call the care guide team at [phone] if you need to cancel or reschedule your appointment.   Patient Self Care Activities:  Attend all scheduled provider appointments Call provider office for new concerns or questions  Participate in Transition of Care Program/Attend TOC scheduled calls Take medications as prescribed   Continue using home oxygen  as prescribed Continue working with the home health team that is involved in your care Continue to monitor the patient's foley catheter regularly and to check his urine daily for cloudiness and amount of urine output- keep this area clean Use assistive devices as needed to prevent falls Continue to monitor the area of redness on patient's (L) bottom: please avoid pressure on the area, and ensure patient is eating a healthy diet that includes protein  Following is a copy of your care plan:   Goals Addressed             This Visit's Progress    [facility] Transitions of Care (TOC) Care Plan   On track    Problems:  Recent Hospitalization for treatment of  sepsis secondary to UTI in setting of chronic foley catheter for BPH Home Health services barrier: established with [facility] prior to admission- to resume services- confirmed daughter/ caregiver has contact information and good rapport with existing home health team - number on AVS: [phone] and No Hospital Follow Up Provider appointment successfully facilitated scheduling of PCP hospital follow up visit during TOC call- for 11/29/23  Goal:  Over the next 30 days, the patient will not experience hospital readmission  Interventions:   Transitions of Care: Durable Medical Equipment (DME) needs assessed with patient/caregiver Doctor Visits  - discussed the importance of doctor visits Communication with PCP and covering RN CM for next week re: successful enrollment of patient in [facility] 30-day program Arranged PCP follow-up within 7 days (Care Guide Scheduled) Post-op wound/incision care reviewed with patient/caregiver Reviewed Signs and symptoms of infection Provided education around maintenance care of chronic foley catheter; use of home oxygen  as per baseline; ongoing treatment/ prevention, and monitoring of stage I (L) buttock area of redness- noted skin intact per review of hospital records  Patient Self Care Activities:  Attend all scheduled provider appointments Call provider office for new concerns or questions  Participate in Transition of Care Program/Attend TOC scheduled calls Take medications as prescribed   Continue using home oxygen  as prescribed Continue working with the home health team that is involved in your care Continue to monitor the patient's foley catheter regularly and to check his urine daily for cloudiness and amount of urine output- keep this area clean Use assistive devices as needed to prevent falls Continue to monitor the area of redness on patient's (L) bottom: please avoid pressure on the area, and ensure patient is eating a healthy diet that includes protein  Plan:  Telephone follow up appointment with care management team member scheduled for:  Wednesday 12/07/22 with covering RN CM; Wednesday 12/14/23 with primary RN CM  Plan for next week's call: Review home health visits- ensure [facility] has resumed home visits Review PCP provider office visit/ any available new lab results/ new medications-  instructions from 11/29/23 hospital office visit Review medication adherence Reinforce/ provide education re: signs/ symptoms UTI along with action plan in setting of chronic foley Reinforce skin  care/ foley catheter care/ need to avoid pressure on bottom:  pressure sore prevention       Patient verbalizes understanding of instructions and care plan provided today and agrees to view in MyChart. Active MyChart status and patient understanding of how to access instructions and care plan via MyChart confirmed with patient.     Telephone follow up appointment with care management team member scheduled for:  Wednesday 12/07/22 with covering RN CM; Wednesday 12/14/23 with primary RN CM  If you are experiencing a Mental Health or Behavioral Health Crisis or need someone to talk to, please  call the Suicide and Crisis Lifeline: 988 call the [place]  National Suicide Prevention Lifeline: (520) 196-2030 or TTY: (804) 502-0793 TTY 6293068925) to talk to a trained counselor call 1-800-273-TALK (toll free, 24 hour hotline) go to [facility] [address] [place] [phone]) call the [facility] Crisis Line: (431)134-3349 call 911   Erlene Hawks, RN, BSN, CCRN Alumnus RN Care Manager  Transitions of Care  [facility] - Population Health  [place] [phone]: direct office

## 2023-11-29 ENCOUNTER — Ambulatory Visit: Admitting: Internal Medicine

## 2023-11-29 ENCOUNTER — Encounter: Payer: Self-pay | Admitting: Internal Medicine

## 2023-11-29 ENCOUNTER — Telehealth: Payer: Self-pay

## 2023-11-29 VITALS — BP 124/70 | HR 68 | Temp 98.0°F | Ht 68.0 in | Wt 245.0 lb

## 2023-11-29 DIAGNOSIS — H9193 Unspecified hearing loss, bilateral: Secondary | ICD-10-CM | POA: Diagnosis not present

## 2023-11-29 DIAGNOSIS — N401 Enlarged prostate with lower urinary tract symptoms: Secondary | ICD-10-CM | POA: Diagnosis not present

## 2023-11-29 DIAGNOSIS — R413 Other amnesia: Secondary | ICD-10-CM

## 2023-11-29 DIAGNOSIS — Z87440 Personal history of urinary (tract) infections: Secondary | ICD-10-CM | POA: Diagnosis not present

## 2023-11-29 DIAGNOSIS — N390 Urinary tract infection, site not specified: Secondary | ICD-10-CM

## 2023-11-29 MED ORDER — DONEPEZIL HCL 5 MG PO TABS
5.0000 mg | ORAL_TABLET | Freq: Every day | ORAL | 3 refills | Status: AC
Start: 2023-11-29 — End: ?

## 2023-11-29 NOTE — Patient Instructions (Addendum)
 Please take all new medication as prescribed -the aricept  5 mg per day  Please consider OTC hearing aids such as [employer] or [employer]  Please continue all other medications as before, including to finish the augmentin  antibiotic  Please have the pharmacy call with any other refills you may need.  Please continue your efforts at being more active, low cholesterol diet, and weight control  Please keep your appointments with your specialists as you may have planned - home health with PT, OT and monthly catheter changes  We can hold on lab testing today  Please make an Appointment to return in 3 months, or sooner if needed

## 2023-11-29 NOTE — Assessment & Plan Note (Signed)
 This is third episode hospn for UTI, doing well, pt to finish augmentin  10 days course

## 2023-11-29 NOTE — Telephone Encounter (Signed)
 Copied from CRM [id number]. Topic: Clinical - Prescription Issue >> Nov 28, 2023 10:01 AM Hilton Lucky wrote: Reason for CRM: Charm with [facility] is requesting a refax of the recent written order sent over to them. Did not specify what the order was for. States it was sent by Doren Gammons. States that the order was not signed and they need one signed by the provider. Caller demanded a timeline, provided an estimate of 3-5 business days, indicating that it depends entirely on the provider and office. Did not leave a fax or callback.

## 2023-11-29 NOTE — Assessment & Plan Note (Signed)
 With worsening recently, daughter / pt decline MRI and or neurology referral, ok for aricept  5 g every day, f/u 3 mo

## 2023-11-29 NOTE — Assessment & Plan Note (Signed)
 For monthly catheter change per [facility] nursing

## 2023-11-29 NOTE — Progress Notes (Signed)
 Patient ID: [name] [name], male   DOB: 1946-07-06, 78 y.o.   MRN: [id number]        Chief Complaint: follow up post hospn apr 14 - 18 2025 with daughter       HPI:  [name] [name] is a 78 y.o. male here with c/o , admitted with hematuria and urethral pain With chills. Tx for complicated UTI and sepsis with proteus blood culture positive.  Tx with IVF and rocephin , then was discharged on Augmentin  875 mg twice a day for a total of 10 days.  Thought to have possible foley urethral injury to cause hematuria on eliquis  , also has known BPH with chronic foley  Since at home he has done reasonably well with significant fatigue, but o/w no falls, fever, chills, or change in urine color or symptoms   No other new complaints.  Has HH with PT, OT to start soon.  Does have worsening hearing in the past 6 mo, declines ENT but might consider OTC hearing aids.  Also with worsening memory in the past 6 months, Dementia overall stable symptomatically, and not assoc with behavioral changes such as hallucinations, paranoia, or agitation  Conts to take all meds as before - none held at dc.  No worsening pressure sores.  Transitional Care Management elements noted today: 1)  Date of D/C: as above 2)  Medication reconciliation:  done today at end visit 3)  Review of D/C summary or other information:  done today 4)  Review of need for f/u on pending diagnostic tests and treatments:  done today 5)  Review of need for Interaction with other providers who will assume or resume care of pt specific problems: done today - none new needed 6)  Education of patient/family/guardian or caregiver: done today with daughter   Wt Readings from Last 3 Encounters:  11/29/23 245 lb (111.1 kg)  11/22/23 244 lb 11.4 oz (111 kg)  11/21/23 245 lb (111.1 kg)   BP Readings from Last 3 Encounters:  11/29/23 124/70  11/25/23 115/79  10/06/23 127/85         Past Medical History:  Diagnosis Date   Acute right-sided CHF (congestive heart failure)  (HCC)    a. 01/2013.   Arthritis    "knees and hands; thoracic area of the spine" (01/05/2013)   BRONCHITIS, CHRONIC 01/02/2009   COMMON MIGRAINE    "none since treating for high BP"   Complication of anesthesia    "aspiration pneumonia after hand OR" (01/05/2013)   DIABETES MELLITUS, TYPE II    GOUT    HYPERLIPIDEMIA    HYPERTENSION    Long term (current) use of anticoagulants    Morbid obesity (HCC)    OBESITY HYPOVENTILATION SYNDROME    a. on home O2.   On home oxygen  therapy    OSA (obstructive sleep apnea)    Permanent atrial fibrillation (HCC)    PROSTATE SPECIFIC ANTIGEN, ELEVATED 06/09/2007   Pulmonary HTN (HCC)    a. multifactorial including obstructive sleep apnea, obesity hypoventilation syndrome and possible pulmonary venous hypertension.   SKIN RASH 03/12/2010   Unspecified hearing loss    Past Surgical History:  Procedure Laterality Date   APPENDECTOMY  1974   APPLICATION OF ROBOTIC ASSISTANCE FOR SPINAL PROCEDURE N/A 11/23/2022   Procedure: APPLICATION OF ROBOTIC ASSISTANCE FOR SPINAL PROCEDURE;  Surgeon: Augusto Blonder, MD;  Location: [facility] OR;  Service: Neurosurgery;  Laterality: N/A;   CARDIOVERSION  05/21/2008; ~ 06/2008   HERNIA REPAIR  INGUINAL HERNIA REPAIR Right    RIGHT HEART CATHETERIZATION N/A 01/08/2013   Procedure: RIGHT HEART CATH;  Surgeon: Lake Pilgrim, MD;  Location: [facility] CATH LAB;  Service: Cardiovascular;  Laterality: N/A;   TENDON REPAIR Right 2009   "lacerated his tendon and pulley" Dr. Jonna Netter   UMBILICAL HERNIA REPAIR      reports that he quit smoking about 45 years ago. His smoking use included cigarettes. He started smoking about 60 years ago. He has a 15 pack-year smoking history. He has never used smokeless tobacco. He reports current alcohol use. He reports that he does not use drugs. family history includes Alzheimer's disease in his father; Cancer in an other family member; Coronary artery disease in an other family member; Prostate  cancer in his father. Allergies  Allergen Reactions   Codeine     Altered mental status "goofy"   Heparin  Rash     Abdominal rash 01/2013   Current Outpatient Medications on File Prior to Visit  Medication Sig Dispense Refill   acetaminophen  (TYLENOL ) 500 MG tablet Take 500-1,000 mg by mouth every 6 (six) hours as needed for moderate pain (pain score 4-6).     allopurinol  (ZYLOPRIM ) 100 MG tablet Take 1 tablet (100 mg total) by mouth daily. 90 tablet 3   ammonium lactate (LAC-HYDRIN) 12 % lotion Apply 1 Application topically as needed for dry skin.     amoxicillin -clavulanate (AUGMENTIN ) 875-125 MG tablet Take 1 tablet by mouth every 12 (twelve) hours. 14 tablet 0   apixaban  (ELIQUIS ) 5 MG TABS tablet Take 1 tablet (5 mg total) by mouth 2 (two) times daily. 180 tablet 1   atorvastatin  (LIPITOR) 20 MG tablet Take 1 tablet (20 mg total) by mouth daily. 90 tablet 3   cholecalciferol  (VITAMIN D3) 25 MCG (1000 UNIT) tablet Take 2,000 Units by mouth daily.     cyanocobalamin  (VITAMIN B12) 1000 MCG/ML injection Inject 1 mL (1,000 mcg total) into the muscle every 30 (thirty) days. 3 mL 3   glipiZIDE  (GLUCOTROL  XL) 10 MG 24 hr tablet TAKE 1 TABLET(10 MG) BY MOUTH DAILY WITH BREAKFAST 90 tablet 3   metoprolol  tartrate (LOPRESSOR ) 25 MG tablet TAKE 1 TABLET(25 MG) BY MOUTH TWICE DAILY 180 tablet 3   nystatin  (MYCOSTATIN /NYSTOP ) powder Apply 1 Application topically 2 (two) times daily. 60 g 3   nystatin  cream (MYCOSTATIN ) Apply 1 Application topically 2 (two) times daily. 30 g 4   OXYGEN  Inhale 3-5 L into the lungs daily. 3L Daily  4L Resting  5L Exertion     polyethylene glycol (MIRALAX  / GLYCOLAX ) 17 g packet Take 17 g by mouth daily as needed. 14 each 0   spironolactone  (ALDACTONE ) 25 MG tablet Take 1 tablet (25 mg total) by mouth daily. 90 tablet 3   tamsulosin  (FLOMAX ) 0.4 MG CAPS capsule TAKE 1 CAPSULE BY MOUTH EVERY DAY AFTER SUPPER 30 capsule 0   torsemide  (DEMADEX ) 20 MG tablet Take 1 tablet  (20 mg total) by mouth daily.     Current Facility-Administered Medications on File Prior to Visit  Medication Dose Route Frequency Provider Last Rate Last Admin   cyanocobalamin  ((VITAMIN B-12)) injection 1,000 mcg  1,000 mcg Intramuscular Q30 days Roslyn Coombe, MD   1,000 mcg at 08/06/21 0947        ROS:  All others reviewed and negative.  Objective        PE:  BP 124/70 (BP Location: Left Arm, Patient Position: Sitting, Cuff Size: Normal)   Pulse  68   Temp 98 F (36.7 C) (Oral)   Ht 5\' 8"  (1.727 m)   Wt 245 lb (111.1 kg)   SpO2 93%   BMI 37.25 kg/m                 Constitutional: Pt appears in NAD               HENT: Head: NCAT.                Right Ear: External ear normal.                 Left Ear: External ear normal.                Eyes: . Pupils are equal, round, and reactive to light. Conjunctivae and EOM are normal               Nose: without d/c or deformity               Neck: Neck supple. Gross normal ROM               Cardiovascular: Normal rate and regular rhythm.                 Pulmonary/Chest: Effort normal and breath sounds without rales or wheezing.                Abd:  Soft, NT, ND, + BS, no organomegaly               Neurological: Pt is alert. At baseline orientation, motor grossly intact               Skin: Skin is warm. No rashes, no other new lesions, LE edema - trace only bilateral               Psychiatric: Pt behavior is normal without agitation   Micro: none  Cardiac tracings I have personally interpreted today:  none  Pertinent Radiological findings (summarize): none   Lab Results  Component Value Date   WBC 9.3 11/23/2023   HGB 11.6 (L) 11/23/2023   HCT 37.1 (L) 11/23/2023   PLT 179 11/23/2023   GLUCOSE 100 (H) 11/23/2023   CHOL 99 03/24/2023   TRIG 99.0 03/24/2023   HDL 33.70 (L) 03/24/2023   LDLCALC 45 03/24/2023   ALT 12 11/21/2023   AST 15 11/21/2023   NA 135 11/23/2023   K 4.4 11/23/2023   CL 105 11/23/2023   CREATININE 1.91  (H) 11/23/2023   BUN 34 (H) 11/23/2023   CO2 22 11/23/2023   TSH 1.31 03/24/2023   PSA 1.62 10/15/2021   INR 1.3 (H) 11/21/2023   HGBA1C 6.0 (H) 11/22/2023   MICROALBUR 7.0 (H) 10/08/2022   Assessment/Plan:  Lorena DARIUS LUNDBERG is a 78 y.o. White or Caucasian [1] male with  has a past medical history of Acute right-sided CHF (congestive heart failure) (HCC), Arthritis, BRONCHITIS, CHRONIC (01/02/2009), COMMON MIGRAINE, Complication of anesthesia, DIABETES MELLITUS, TYPE II, GOUT, HYPERLIPIDEMIA, HYPERTENSION, Long term (current) use of anticoagulants, Morbid obesity (HCC), OBESITY HYPOVENTILATION SYNDROME, On home oxygen  therapy, OSA (obstructive sleep apnea), Permanent atrial fibrillation (HCC), PROSTATE SPECIFIC ANTIGEN, ELEVATED (06/09/2007), Pulmonary HTN (HCC), SKIN RASH (03/12/2010), and Unspecified hearing loss.  Recurrent UTI This is third episode hospn for UTI, doing well, pt to finish augmentin  10 days course  BPH (benign prostatic hyperplasia) For monthly catheter change per [facility] nursing  Bilateral hearing loss Pt encouraged to find OTC hearing  aids  Memory loss With worsening recently, daughter / pt decline MRI and or neurology referral, ok for aricept  5 g every day, f/u 3 mo  Followup: Return in about 3 months (around 02/28/2024).  Rosalia Colonel, MD 11/29/2023 9:58 AM [facility]

## 2023-11-29 NOTE — Telephone Encounter (Addendum)
 Has been faxed to [facility]. Nothing further needed.

## 2023-11-29 NOTE — Assessment & Plan Note (Signed)
 Pt encouraged to find OTC hearing aids

## 2023-12-01 ENCOUNTER — Telehealth: Payer: Self-pay | Admitting: Nurse Practitioner

## 2023-12-02 NOTE — Telephone Encounter (Signed)
 [name] Clarke, NP to Me      12/01/23  5:22 PM ONO on BiPAP/3 lpm O2: </88% for 8 min 52 sec, SpO2 low 86% with average 92% and high 98%. Continue with current 3 lpm bled through BiPAP. Thanks!  Called the pt and there was no answer- LMTCB.

## 2023-12-06 ENCOUNTER — Other Ambulatory Visit: Payer: Self-pay | Admitting: Cardiology

## 2023-12-07 ENCOUNTER — Encounter: Payer: Self-pay | Admitting: Cardiology

## 2023-12-07 ENCOUNTER — Other Ambulatory Visit: Payer: Self-pay

## 2023-12-07 MED ORDER — TORSEMIDE 20 MG PO TABS: 20.0000 mg | ORAL_TABLET | Freq: Every day | ORAL | 1 refills | Status: DC

## 2023-12-07 NOTE — Transitions of Care (Post Inpatient/ED Visit) (Signed)
 Transition of Care week 2  Visit Note  12/07/2023  Name: [name] [name] MRN: [id number]          DOB: 06/21/1946  Situation: Patient enrolled in [facility] 30-day program. Visit completed with daughter, Thersia Flax with patient's permission by telephone.   Background:   Initial Transition Care Management Follow-up Telephone Call Date of Discharge: 11/25/23 Discharge Facility: [facility]) Type of Discharge: Inpatient Admission Primary Inpatient Discharge Diagnosis:: Sepsis secondary to UTI. chronic foley catheter secondary to [employer] How have you been since you were released from the hospital?: Better Any questions or concerns?: No  Past Medical History:  Diagnosis Date   Acute right-sided CHF (congestive heart failure) (HCC)    a. 01/2013.   Arthritis    "knees and hands; thoracic area of the spine" (01/05/2013)   BRONCHITIS, CHRONIC 01/02/2009   COMMON MIGRAINE    "none since treating for high BP"   Complication of anesthesia    "aspiration pneumonia after hand OR" (01/05/2013)   DIABETES MELLITUS, TYPE II    GOUT    HYPERLIPIDEMIA    HYPERTENSION    Long term (current) use of anticoagulants    Morbid obesity (HCC)    OBESITY HYPOVENTILATION SYNDROME    a. on home O2.   On home oxygen  therapy    OSA (obstructive sleep apnea)    Permanent atrial fibrillation (HCC)    PROSTATE SPECIFIC ANTIGEN, ELEVATED 06/09/2007   Pulmonary HTN (HCC)    a. multifactorial including obstructive sleep apnea, obesity hypoventilation syndrome and possible pulmonary venous hypertension.   SKIN RASH 03/12/2010   Unspecified hearing loss     Assessment: Patient Reported Symptoms: Cognitive Cognitive Status: Alert and oriented to person, place, and time      Neurological Neurological Review of Symptoms: No symptoms reported    HEENT HEENT Symptoms Reported: No symptoms reported      Cardiovascular Cardiovascular Symptoms Reported: No symptoms reported Does patient have uncontrolled Hypertension?: Yes Is  patient checking Blood Pressure at home?: Yes Patient's Recent BP reading at home: 110/73 Cardiovascular Conditions: High blood cholesterol, Hypertension Weight: 247 lb (112 kg)  Respiratory Respiratory Symptoms Reported: No symptoms reported Other Respiratory Symptoms: Uses home 02 at 3 L Respiratory Conditions: COPD Respiratory Self-Management Outcome: 4 (good)  Endocrine Patient reports the following symptoms related to hypoglycemia or hyperglycemia : No symptoms reported    Gastrointestinal Gastrointestinal Symptoms Reported: No symptoms reported      Genitourinary Genitourinary Symptoms Reported: Other Other Genitourinary Symptoms: Uncomfortable catheter using Tylenol  Other Genitourinary Conditions: BPH  Integumentary Integumentary Symptoms Reported: No symptoms reported    Musculoskeletal Musculoskelatal Symptoms Reviewed: Difficulty walking, Unsteady gait Additional Musculoskeletal Details: uses walker        Psychosocial Psychosocial Symptoms Reported: No symptoms reported         Vitals:   12/07/23 1313  BP: 110/72    Medications Reviewed Today     Reviewed by Jamie Mccoy, RN (Registered Nurse) on 12/07/23 at 1254  Med List Status: <None>   Medication Order Taking? Sig Documenting Provider Last Dose Status Informant  acetaminophen  (TYLENOL ) 500 MG tablet [id number] Yes Take 500-1,000 mg by mouth every 6 (six) hours as needed for moderate pain (pain score 4-6). [provider] Taking Active Nursing Home Medication Administration Guide (MAG)  allopurinol  (ZYLOPRIM ) 100 MG tablet [id number] Yes Take 1 tablet (100 mg total) by mouth daily. Roslyn Coombe, MD Taking Active   ammonium lactate (LAC-HYDRIN) 12 % lotion [id number] No Apply 1  Application topically as needed for dry skin.  Patient not taking: Reported on 12/07/2023   [provider] Not Taking Active            Med Note Miriam American, Aundra Vanderwerf Dec 07, 2023 12:52 PM) It's available if  needed  amoxicillin -clavulanate (AUGMENTIN ) 875-125 MG tablet [id number] No Take 1 tablet by mouth every 12 (twelve) hours.  Patient not taking: Reported on 12/07/2023   Barbee Lew, MD Not Taking Active            Med Note Miriam American, Aundra Ballengee Dec 07, 2023 12:53 PM) Donnel Gail last week  apixaban  (ELIQUIS ) 5 MG TABS tablet [id number] Yes Take 1 tablet (5 mg total) by mouth 2 (two) times daily. Lenise Quince, MD Taking Active            Med Note Micah Ade   Tue Nov 22, 2023  8:53 AM) @1800   atorvastatin  (LIPITOR) 20 MG tablet [id number] Yes Take 1 tablet (20 mg total) by mouth daily. Roslyn Coombe, MD Taking Active   cholecalciferol  (VITAMIN D3) 25 MCG (1000 UNIT) tablet [id number] Yes Take 2,000 Units by mouth daily. [provider] Taking Active Nursing Home Medication Administration Guide (MAG)  cyanocobalamin  ((VITAMIN B-12)) injection 1,000 mcg [id number]   John, James W, MD  Active            Med Note (TOUSEY, LAINE M   Mon Nov 28, 2023 10:03 AM) 11/28/23- Reports currently taking- needs refill, caregiver reports has April 2025 dose and will give "soon"  cyanocobalamin  (VITAMIN B12) 1000 MCG/ML injection [id number] Yes Inject 1 mL (1,000 mcg total) into the muscle every 30 (thirty) days. Roslyn Coombe, MD Taking Active   donepezil  (ARICEPT ) 5 MG tablet [id number] Yes Take 1 tablet (5 mg total) by mouth at bedtime. Roslyn Coombe, MD Taking Active   glipiZIDE  (GLUCOTROL  XL) 10 MG 24 hr tablet [id number] Yes TAKE 1 TABLET(10 MG) BY MOUTH DAILY WITH BREAKFAST John, James W, MD Taking Active   metoprolol  tartrate (LOPRESSOR ) 25 MG tablet [id number] Yes TAKE 1 TABLET(25 MG) BY MOUTH TWICE DAILY Roslyn Coombe, MD Taking Active   nystatin  (MYCOSTATIN /NYSTOP ) powder [id number] Yes Apply 1 Application topically 2 (two) times daily. Stoneking, Ponce Brisker., MD Taking Active   nystatin  cream (MYCOSTATIN ) [id number] Yes Apply 1 Application topically 2 (two) times daily. Mellie Sprinkle., MD Taking Active   OXYGEN  [id number] Yes Inhale 3-5 L into the lungs daily. 3L Daily  4L Resting  5L Exertion [provider] Taking Active Nursing Home Medication Administration Guide (MAG)           Med Note Miriam American, Laqueena Hinchey Y   Wed Dec 07, 2023 12:51 PM) Still tolerating 3L  polyethylene glycol (MIRALAX  / GLYCOLAX ) 17 g packet [id number] No Take 17 g by mouth daily as needed.  Patient not taking: Reported on 12/07/2023   Deforest Fast, MD Not Taking Active            Med Note Miriam American, Aundra Macera Dec 07, 2023 12:53 PM) It's available when needed  spironolactone  (ALDACTONE ) 25 MG tablet [id number] Yes Take 1 tablet (25 mg total) by mouth daily. Lenise Quince, MD Taking Active   tamsulosin  (FLOMAX ) 0.4 MG CAPS capsule [id number] Yes TAKE 1 CAPSULE BY MOUTH EVERY DAY AFTER SUPPER Roslyn Coombe, MD Taking Active   torsemide  (DEMADEX ) 20 MG tablet [id number] Yes Take 1 tablet (20 mg  total) by mouth daily. Lenise Quince, MD Taking Active   Med List Note (White, Tonia S, CPhT 01/31/23 1610): [facility]  [phone]            Goals Addressed             This Visit's Progress    VBCI Transitions of Care (TOC) Care Plan   On track    Problems:  Recent Hospitalization for treatment of  sepsis secondary to UTI in setting of chronic foley catheter for BPH  - 12/07/23 notified [facility] of patient's discharge and ongoing needs for [facility] to change foley monthly Home Health services barrier: established with [facility] home health prior to admission- to resume services- confirmed daughter/ caregiver has contact information and good rapport with existing home health team - number on AVS: [phone] and No Hospital Follow Up Provider appointment successfully facilitated scheduling of PCP hospital follow up visit during TOC call- for 11/29/23  Goal:  Over the next 30 days, the patient will not experience hospital readmission Have follow up with  [facility] for [facility] needs  Interventions:  Transitions of Care: Durable Medical Equipment (DME) needs assessed with patient/caregiver Doctor Visits  - discussed the importance of doctor visits Communication with PCP and covering RN CM for next week re: successful enrollment of patient in [facility] 30-day program Arranged PCP follow-up within 7 days (Care Guide Scheduled) Post-op wound/incision care reviewed with patient/caregiver Reviewed Signs and symptoms of infection Provided education around maintenance care of chronic foley catheter; use of home oxygen  as per baseline; ongoing treatment/ prevention, and monitoring of stage I (L) buttock area of redness- noted skin intact per review of hospital records 12/06/33 Folow up with [facility] Patient Self Care Activities:  Attend all scheduled provider appointments Call provider office for new concerns or questions  Participate in Transition of Care Program/Attend TOC scheduled calls Take medications as prescribed   Continue using home oxygen  as prescribed Continue working with the home health team that is involved in your care Continue to monitor the patient's foley catheter regularly and to check his urine daily for cloudiness and amount of urine output- keep this area clean Use assistive devices as needed to prevent falls Continue to monitor the area of redness on patient's (L) bottom: please avoid pressure on the area, and ensure patient is eating a healthy diet that includes protein 12/06/33 no breakdown in skin per daughter  Plan:  Telephone follow up appointment with care management team member scheduled for:  Wednesday 12/07/22 with covering RN CM; Wednesday 12/14/23 with primary RN CM  Plan for next week's call: Review home health visits- ensure [facility] has resumed home visits 12/07/23 [facility] outreach and will follow up on orders with their liaison and PCP if needed per Marlin Simmonds, Production designer, theatre/television/film Review PCP provider office visit/ any available new lab  results/ new medications- instructions from 11/29/23 hospital office visit, 12/07/23 managing well per daughter, new medication added - Aricept  see medications for details Review medication adherence Reinforce/ provide education re: signs/ symptoms UTI along with action plan in setting of chronic foley Reinforce skin care/ foley catheter care/ need to avoid pressure on bottom:  pressure sore prevention         Brown Cape, RN, Scientist, research (physical sciences), CCM [employer], [facility] RN Care Manager Direct Dial: [phone]

## 2023-12-07 NOTE — Patient Instructions (Signed)
 Visit Information  Thank you for taking time to visit with me today. Please don't hesitate to contact me if I can be of assistance to you before our next scheduled telephone appointment.  Our next appointment is by telephone on 12/14/2023 at 1315 with Nash Bade, RN  Following is a copy of your care plan:   Goals Addressed             This Visit's Progress    VBCI Transitions of Care (TOC) Care Plan   On track    Problems:  Recent Hospitalization for treatment of  sepsis secondary to UTI in setting of chronic foley catheter for BPH  - 12/07/23 notified [facility] of patient's discharge and ongoing needs for [facility] to change foley monthly Home Health services barrier: established with [facility] home health prior to admission- to resume services- confirmed daughter/ caregiver has contact information and good rapport with existing home health team - number on AVS: [phone] and No Hospital Follow Up Provider appointment successfully facilitated scheduling of PCP hospital follow up visit during TOC call- for 11/29/23  Goal:  Over the next 30 days, the patient will not experience hospital readmission Have follow up with [facility] for [facility] needs  Interventions:  Transitions of Care: Durable Medical Equipment (DME) needs assessed with patient/caregiver Doctor Visits  - discussed the importance of doctor visits Communication with PCP and covering RN CM for next week re: successful enrollment of patient in [facility] 30-day program Arranged PCP follow-up within 7 days (Care Guide Scheduled) Post-op wound/incision care reviewed with patient/caregiver Reviewed Signs and symptoms of infection Provided education around maintenance care of chronic foley catheter; use of home oxygen  as per baseline; ongoing treatment/ prevention, and monitoring of stage I (L) buttock area of redness- noted skin intact per review of hospital records 12/06/33 Folow up with [facility] Patient Self Care Activities:  Attend all  scheduled provider appointments Call provider office for new concerns or questions  Participate in Transition of Care Program/Attend [facility] scheduled calls Take medications as prescribed   Continue using home oxygen  as prescribed Continue working with the home health team that is involved in your care Continue to monitor the patient's foley catheter regularly and to check his urine daily for cloudiness and amount of urine output- keep this area clean Use assistive devices as needed to prevent falls Continue to monitor the area of redness on patient's (L) bottom: please avoid pressure on the area, and ensure patient is eating a healthy diet that includes protein 12/06/33 no breakdown in skin per daughter  Plan:  Telephone follow up appointment with care management team member scheduled for:  Wednesday 12/07/22 with covering RN CM; Wednesday 12/14/23 with primary RN CM  Plan for next week's call: Review home health visits- ensure [facility] has resumed home visits 12/07/23 [facility] outreach and will follow up on orders with their liaison and PCP if needed per Marlin Simmonds, Production designer, theatre/television/film Review PCP provider office visit/ any available new lab results/ new medications- instructions from 11/29/23 hospital office visit, 12/07/23 managing well per daughter, new medication added - Aricept  see medications for details Review medication adherence Reinforce/ provide education re: signs/ symptoms UTI along with action plan in setting of chronic foley Reinforce skin care/ foley catheter care/ need to avoid pressure on bottom:  pressure sore prevention        Patient verbalizes understanding of instructions and care plan provided today and agrees to view in MyChart. Active MyChart status and patient understanding of how to access instructions and  care plan via MyChart confirmed with patient.     Telephone follow up appointment with care management team member scheduled for: Laine 12/14/23   Please call the care guide team at  [phone] if you need to cancel or reschedule your appointment.   Please call the [place]  National Suicide Prevention Lifeline: 602-291-3393 or TTY: (432) 076-9404 TTY 820-744-4629) to talk to a trained counselor if you are experiencing a Mental Health or Behavioral Health Crisis or need someone to talk to.  Brown Cape, RN, BSN, CCM [facility], [facility] Health RN Care Manager Direct Dial: [phone]

## 2023-12-08 DIAGNOSIS — A419 Sepsis, unspecified organism: Secondary | ICD-10-CM | POA: Diagnosis not present

## 2023-12-08 DIAGNOSIS — N390 Urinary tract infection, site not specified: Secondary | ICD-10-CM | POA: Diagnosis not present

## 2023-12-08 DIAGNOSIS — T83511D Infection and inflammatory reaction due to indwelling urethral catheter, subsequent encounter: Secondary | ICD-10-CM | POA: Diagnosis not present

## 2023-12-08 DIAGNOSIS — I130 Hypertensive heart and chronic kidney disease with heart failure and stage 1 through stage 4 chronic kidney disease, or unspecified chronic kidney disease: Secondary | ICD-10-CM | POA: Diagnosis not present

## 2023-12-08 DIAGNOSIS — B964 Proteus (mirabilis) (morganii) as the cause of diseases classified elsewhere: Secondary | ICD-10-CM | POA: Diagnosis not present

## 2023-12-09 DIAGNOSIS — A419 Sepsis, unspecified organism: Secondary | ICD-10-CM | POA: Diagnosis not present

## 2023-12-09 DIAGNOSIS — I130 Hypertensive heart and chronic kidney disease with heart failure and stage 1 through stage 4 chronic kidney disease, or unspecified chronic kidney disease: Secondary | ICD-10-CM | POA: Diagnosis not present

## 2023-12-09 DIAGNOSIS — N390 Urinary tract infection, site not specified: Secondary | ICD-10-CM | POA: Diagnosis not present

## 2023-12-09 DIAGNOSIS — B964 Proteus (mirabilis) (morganii) as the cause of diseases classified elsewhere: Secondary | ICD-10-CM | POA: Diagnosis not present

## 2023-12-09 DIAGNOSIS — T83511D Infection and inflammatory reaction due to indwelling urethral catheter, subsequent encounter: Secondary | ICD-10-CM | POA: Diagnosis not present

## 2023-12-12 ENCOUNTER — Telehealth: Payer: Self-pay | Admitting: Internal Medicine

## 2023-12-12 NOTE — Telephone Encounter (Signed)
 Copied from CRM [id number]. Topic: Clinical - Home Health Verbal Orders >> Dec 12, 2023  2:30 PM Jenice Mitts wrote: Caller/Agency: Liji from [facility] Callback Number: [phone] Service Requested: Physical Therapy Frequency: 1 time / 1 week 2 times/ 2 weeks 1 time /  4 weeks  Any new concerns about the patient? No

## 2023-12-13 ENCOUNTER — Telehealth: Payer: Self-pay | Admitting: Internal Medicine

## 2023-12-13 DIAGNOSIS — T83511D Infection and inflammatory reaction due to indwelling urethral catheter, subsequent encounter: Secondary | ICD-10-CM | POA: Diagnosis not present

## 2023-12-13 DIAGNOSIS — A419 Sepsis, unspecified organism: Secondary | ICD-10-CM | POA: Diagnosis not present

## 2023-12-13 DIAGNOSIS — N390 Urinary tract infection, site not specified: Secondary | ICD-10-CM | POA: Diagnosis not present

## 2023-12-13 DIAGNOSIS — B964 Proteus (mirabilis) (morganii) as the cause of diseases classified elsewhere: Secondary | ICD-10-CM | POA: Diagnosis not present

## 2023-12-13 DIAGNOSIS — I130 Hypertensive heart and chronic kidney disease with heart failure and stage 1 through stage 4 chronic kidney disease, or unspecified chronic kidney disease: Secondary | ICD-10-CM | POA: Diagnosis not present

## 2023-12-13 NOTE — Telephone Encounter (Signed)
 Ok for verbal

## 2023-12-13 NOTE — Telephone Encounter (Signed)
 Copied from CRM [id number]. Topic: Clinical - Home Health Verbal Orders >> Dec 13, 2023 12:23 PM Shereese L wrote: Caller/Agency: Michelle/[facility] Callback Number: [phone](secured vm) Service Requested: Occupational Therapy Frequency: 2X 2WEEKS/ 1X 1WEEK  Any new concerns about the patient? No

## 2023-12-13 NOTE — Telephone Encounter (Signed)
 Ok for [employer]

## 2023-12-14 ENCOUNTER — Other Ambulatory Visit: Payer: Self-pay | Admitting: *Deleted

## 2023-12-14 DIAGNOSIS — N390 Urinary tract infection, site not specified: Secondary | ICD-10-CM | POA: Diagnosis not present

## 2023-12-14 DIAGNOSIS — B964 Proteus (mirabilis) (morganii) as the cause of diseases classified elsewhere: Secondary | ICD-10-CM | POA: Diagnosis not present

## 2023-12-14 DIAGNOSIS — I130 Hypertensive heart and chronic kidney disease with heart failure and stage 1 through stage 4 chronic kidney disease, or unspecified chronic kidney disease: Secondary | ICD-10-CM | POA: Diagnosis not present

## 2023-12-14 DIAGNOSIS — A419 Sepsis, unspecified organism: Secondary | ICD-10-CM | POA: Diagnosis not present

## 2023-12-14 DIAGNOSIS — T83511D Infection and inflammatory reaction due to indwelling urethral catheter, subsequent encounter: Secondary | ICD-10-CM | POA: Diagnosis not present

## 2023-12-14 NOTE — Transitions of Care (Post Inpatient/ED Visit) (Signed)
 Transition of Care week 3/ day # 9  Visit Note  12/14/2023  Name: [name] [name] MRN: [id number]          DOB: 03/12/1946  Situation: Patient enrolled in [facility] 30-day program. Visit completed with patient's caregiver/ daughter [name] [name]- verified on [facility] DPR by telephone.   HIPAA identifiers x 2 verified  Background:  Recent hospitalization April 14-18, 2025 for sepsis secondary to complicated UTI in setting of chronic foley catheter/ baseline CHF Multiple recent inpatient/ unplanned hospitalizations: 4 x last 12 months  Initial Transition Care Management Follow-up Telephone Call    Past Medical History:  Diagnosis Date   Acute right-sided CHF (congestive heart failure) (HCC)    a. 01/2013.   Arthritis    "knees and hands; thoracic area of the spine" (01/05/2013)   BRONCHITIS, CHRONIC 01/02/2009   COMMON MIGRAINE    "none since treating for high BP"   Complication of anesthesia    "aspiration pneumonia after hand OR" (01/05/2013)   DIABETES MELLITUS, TYPE II    GOUT    HYPERLIPIDEMIA    HYPERTENSION    Long term (current) use of anticoagulants    Morbid obesity (HCC)    OBESITY HYPOVENTILATION SYNDROME    a. on home O2.   On home oxygen  therapy    OSA (obstructive sleep apnea)    Permanent atrial fibrillation (HCC)    PROSTATE SPECIFIC ANTIGEN, ELEVATED 06/09/2007   Pulmonary HTN (HCC)    a. multifactorial including obstructive sleep apnea, obesity hypoventilation syndrome and possible pulmonary venous hypertension.   SKIN RASH 03/12/2010   Unspecified hearing loss    Assessment:  Per caregiver: "he is doing fine- great actually.  Feels fine and has no complaints.  Pee looks fine in the catheter- nice clear pale yellow.  His weights are at his normal, between 246-247 lbs.  Home Health PT and OT are now in and making visits; nurse will come next week to change the catheter out.  He is using his walker and has had no falls.  Eating fine.  Things are going fine;    Caregiver denies  clinical concerns and reports patient in no distress, with no physical complaints/ concerns  Patient Reported Symptoms: Cognitive Cognitive Status: Difficulties with attention and concentration, Able to follow simple commands, Normal speech and language skills, Other: ("possible dementia" per daughter/ caregiver)   Health Maintenance Behaviors: Annual physical exam  Neurological Neurological Review of Symptoms: No symptoms reported Neurological Conditions: Dementia Neurological Management Strategies: Medication therapy, Routine screening  HEENT HEENT Symptoms Reported: No symptoms reported      Cardiovascular Cardiovascular Symptoms Reported: No symptoms reported Does patient have uncontrolled Hypertension?: No Cardiovascular Conditions: High blood cholesterol, Hypertension Cardiovascular Management Strategies: Medication therapy, Routine screening, Diet modification, Adequate rest, Fluid modification Weight: 247 lb (112 kg)  Respiratory Respiratory Symptoms Reported: Shortness of breath Other Respiratory Symptoms: Caregiver reports continues to wear home O 2 continuously at 3 L/min: she denies that patient has shortness of breath Respiratory Conditions: Shortness of breath, COPD  Endocrine Patient reports the following symptoms related to hypoglycemia or hyperglycemia : No symptoms reported Is patient diabetic?: No    Gastrointestinal Gastrointestinal Symptoms Reported: No symptoms reported Additional Gastrointestinal Details: per caregiver: "He has a normal BM every day, no problems; eating well"      Genitourinary Genitourinary Symptoms Reported: No symptoms reported Other Genitourinary Symptoms: Caregiver denies concerns around foley catheter today: reports "pee looks nice and pale clear yellow;" reports "normal" volume- reports catheter exchange  by home health planned for "next week" Genitourinary Conditions: Difficulty voiding, Other Other Genitourinary Conditions:  BPH Genitourinary Management Strategies: Medication therapy, Catheter, indwelling, Fluid modification Indwelling Catheter Inserted: 11/22/23 (per caregiver: "when he was in the hospital last; home health nurse will change it out next week")  Integumentary Integumentary Symptoms Reported: No symptoms reported Additional Integumentary Details: Caregiver again reports "skin looks better, no redness, no breakdown;" reinforced previously provided education around need for daily skin monitoring/ action plan for prevention of skin breakdown Skin Management Strategies: Routine screening, Exercise, Activity  Musculoskeletal Musculoskelatal Symptoms Reviewed: Unsteady gait Additional Musculoskeletal Details: per caregiver continues to use rolling walker "all the time" Musculoskeletal Conditions: Mobility limited Musculoskeletal Management Strategies: Medical device, Routine screening, Coping strategies      Psychosocial Psychosocial Symptoms Reported: No symptoms reported         There were no vitals filed for this visit.  Medications Reviewed Today     Reviewed by Kongmeng Santoro M, RN (Registered Nurse) on 12/14/23 at 1322  Med List Status: <None>   Medication Order Taking? Sig Documenting Provider Last Dose Status Informant  acetaminophen  (TYLENOL ) 500 MG tablet [id number]  Take 500-1,000 mg by mouth every 6 (six) hours as needed for moderate pain (pain score 4-6). [provider]  Active Nursing Home Medication Administration Guide (MAG)  allopurinol  (ZYLOPRIM ) 100 MG tablet [id number]  Take 1 tablet (100 mg total) by mouth daily. Roslyn Coombe, MD  Active   ammonium lactate (LAC-HYDRIN) 12 % lotion [id number]  Apply 1 Application topically as needed for dry skin.  Patient not taking: Reported on 12/07/2023   [provider]  Active            Med Note Miriam American, Aundra Menchaca Dec 07, 2023 12:52 PM) It's available if needed  amoxicillin -clavulanate (AUGMENTIN ) 875-125 MG tablet  [id number]  Take 1 tablet by mouth every 12 (twelve) hours.  Patient not taking: Reported on 12/07/2023   Barbee Lew, MD  Active            Med Note Miriam American, Aundra Guedes Dec 07, 2023 12:53 PM) Donnel Gail last week  apixaban  (ELIQUIS ) 5 MG TABS tablet [id number]  Take 1 tablet (5 mg total) by mouth 2 (two) times daily. Lenise Quince, MD  Active            Med Note Verdia Glad, Ledon Pry   Tue Nov 22, 2023  8:53 AM) @1800   atorvastatin  (LIPITOR) 20 MG tablet [id number]  Take 1 tablet (20 mg total) by mouth daily. Roslyn Coombe, MD  Active   cholecalciferol  (VITAMIN D3) 25 MCG (1000 UNIT) tablet [id number]  Take 2,000 Units by mouth daily. [provider]  Active Nursing Home Medication Administration Guide (MAG)  cyanocobalamin  ((VITAMIN B-12)) injection 1,000 mcg [id number]   Roslyn Coombe, MD  Active            Med Note (Melvia Matousek M   Mon Nov 28, 2023 10:03 AM) 11/28/23- Reports currently taking- needs refill, caregiver reports has April 2025 dose and will give "soon"  cyanocobalamin  (VITAMIN B12) 1000 MCG/ML injection [id number]  Inject 1 mL (1,000 mcg total) into the muscle every 30 (thirty) days. Roslyn Coombe, MD  Active   donepezil  (ARICEPT ) 5 MG tablet [id number] Yes Take 1 tablet (5 mg total) by mouth at bedtime. Roslyn Coombe, MD Taking Active   glipiZIDE  (GLUCOTROL  XL) 10 MG 24 hr tablet [id number]  TAKE 1 TABLET(10  MG) BY MOUTH DAILY WITH BREAKFAST John, James W, MD  Active   metoprolol  tartrate (LOPRESSOR ) 25 MG tablet [id number]  TAKE 1 TABLET(25 MG) BY MOUTH TWICE DAILY Roslyn Coombe, MD  Active   nystatin  (MYCOSTATIN /NYSTOP ) powder [id number]  Apply 1 Application topically 2 (two) times daily. Stoneking, Ponce Brisker., MD  Active   nystatin  cream (MYCOSTATIN ) [id number]  Apply 1 Application topically 2 (two) times daily. Mellie Sprinkle., MD  Active   OXYGEN  [id number]  Inhale 3-5 L into the lungs daily. 3L Daily  4L Resting  5L Exertion [provider]   Active Nursing Home Medication Administration Guide (MAG)           Med Note Miriam American, VICTORIA Y   Wed Dec 07, 2023 12:51 PM) Still tolerating 3L  polyethylene glycol (MIRALAX  / GLYCOLAX ) 17 g packet [id number]  Take 17 g by mouth daily as needed.  Patient not taking: Reported on 12/07/2023   Deforest Fast, MD  Active            Med Note Miriam American, Aundra Toback Dec 07, 2023 12:53 PM) It's available when needed  spironolactone  (ALDACTONE ) 25 MG tablet [id number]  Take 1 tablet (25 mg total) by mouth daily. Lenise Quince, MD  Active   tamsulosin  (FLOMAX ) 0.4 MG CAPS capsule [id number]  TAKE 1 CAPSULE BY MOUTH EVERY DAY AFTER SUPPER Roslyn Coombe, MD  Active   torsemide  (DEMADEX ) 20 MG tablet [id number]  Take 1 tablet (20 mg total) by mouth daily. Lenise Quince, MD  Active   Med List Note Georgeana Kindler 01/31/23 [id number]): [facility]  [phone]           Recommendation:   Continue established plan of care for [facility]  Follow Up Plan:   Telephone follow-up in 1 week- as scheduled 12/21/23  Plan for next week's call: Review home health visits- OT/ PT/ nursing-- for foley catheter exchange "next week" Review ongoing medication adherence Reinforce/ provide education re: signs/ symptoms UTI along with action plan in setting of chronic foley Reinforce skin care/ foley catheter care/ need to avoid pressure on bottom:  pressure sore prevention Review daily weights since  Wednesday 12/14/23: baseline weights 246-247 lbs/ action plan for weight gain; fluid intake/ diuretics- balance between dehydration/ overload  Total time spent from review to signing of note/ including any care coordination interventions:  66 minutes  Pls call/ message for questions,  Chidi Shirer Mckinney Kurt Azimi, RN, BSN, Media planner  Transitions of Care  [facility] - Population Health  [place] [phone]: direct office

## 2023-12-14 NOTE — Telephone Encounter (Signed)
 Called and left voicemail giving verbals.

## 2023-12-14 NOTE — Telephone Encounter (Signed)
 Called and gave verbals.

## 2023-12-14 NOTE — Patient Instructions (Addendum)
 Visit Information  Thank you for taking time to visit with me today. Please don't hesitate to contact me if I can be of assistance to you before our next scheduled telephone appointment.  Our next appointment is by telephone on Wednesday 12/21/23 at 1:00 pm  Please call the care guide team at [phone] if you need to cancel or reschedule your appointment.   Following are the goals we discussed today:  Patient Self Care Activities:  Attend all scheduled provider appointments Call provider office for new concerns or questions  Participate in Transition of Care Program/Attend TOC scheduled calls Take medications as prescribed   Continue using home oxygen  as prescribed Continue working with the home health team that is involved in your care Weigh yourself every day to stay on top of early fluid retention: write down your weights every day so you remember what it is from day to day: follow the weight-gain guidelines and action plan to call your doctor if you gain more than 3 lbs overnight, or 5 lbs in one week Continue to monitor the patient's foley catheter regularly and to check his urine daily for cloudiness and amount of urine output- keep this area clean Use assistive devices as needed to prevent falls Continue to monitor the area of redness on patient's (L) bottom: please avoid pressure on the area, and ensure patient is eating a healthy diet that includes protein  If you are experiencing a Mental Health or Behavioral Health Crisis or need someone to talk to, please  call the Suicide and Crisis Lifeline: 988 call the [place]  National Suicide Prevention Lifeline: 681 055 8393 or TTY: 580-126-1693 TTY 712-436-0021) to talk to a trained counselor call 1-800-273-TALK (toll free, 24 hour hotline) go to [facility] [address], [place] [phone]) call the [facility] Crisis Line: [phone] call 911   Daughter- caregiver verbalizes  understanding of instructions and care plan provided today and agrees to view in MyChart. Active MyChart status and patient understanding of how to access instructions and care plan via MyChart confirmed with patient.     Pls call/ message for questions,  Reshawn Ostlund Mckinney Cornelious Diven, RN, BSN, CCRN Alumnus RN Care Manager  Transitions of Care  [facility] - [facility] [phone]: direct office

## 2023-12-15 ENCOUNTER — Encounter: Payer: Self-pay | Admitting: *Deleted

## 2023-12-15 DIAGNOSIS — T83511D Infection and inflammatory reaction due to indwelling urethral catheter, subsequent encounter: Secondary | ICD-10-CM | POA: Diagnosis not present

## 2023-12-15 DIAGNOSIS — N390 Urinary tract infection, site not specified: Secondary | ICD-10-CM | POA: Diagnosis not present

## 2023-12-15 DIAGNOSIS — A419 Sepsis, unspecified organism: Secondary | ICD-10-CM | POA: Diagnosis not present

## 2023-12-15 DIAGNOSIS — B964 Proteus (mirabilis) (morganii) as the cause of diseases classified elsewhere: Secondary | ICD-10-CM | POA: Diagnosis not present

## 2023-12-15 DIAGNOSIS — I130 Hypertensive heart and chronic kidney disease with heart failure and stage 1 through stage 4 chronic kidney disease, or unspecified chronic kidney disease: Secondary | ICD-10-CM | POA: Diagnosis not present

## 2023-12-15 NOTE — Telephone Encounter (Signed)
 Called the pt and still no answer- sending msg to him with results via mychart.

## 2023-12-16 ENCOUNTER — Ambulatory Visit: Payer: TRICARE For Life (TFL) | Admitting: Urology

## 2023-12-16 DIAGNOSIS — I130 Hypertensive heart and chronic kidney disease with heart failure and stage 1 through stage 4 chronic kidney disease, or unspecified chronic kidney disease: Secondary | ICD-10-CM | POA: Diagnosis not present

## 2023-12-16 DIAGNOSIS — B964 Proteus (mirabilis) (morganii) as the cause of diseases classified elsewhere: Secondary | ICD-10-CM | POA: Diagnosis not present

## 2023-12-16 DIAGNOSIS — N390 Urinary tract infection, site not specified: Secondary | ICD-10-CM | POA: Diagnosis not present

## 2023-12-16 DIAGNOSIS — A419 Sepsis, unspecified organism: Secondary | ICD-10-CM | POA: Diagnosis not present

## 2023-12-16 DIAGNOSIS — T83511D Infection and inflammatory reaction due to indwelling urethral catheter, subsequent encounter: Secondary | ICD-10-CM | POA: Diagnosis not present

## 2023-12-17 ENCOUNTER — Other Ambulatory Visit: Payer: Self-pay | Admitting: Internal Medicine

## 2023-12-19 ENCOUNTER — Other Ambulatory Visit: Payer: Self-pay

## 2023-12-19 ENCOUNTER — Telehealth: Payer: Self-pay | Admitting: Internal Medicine

## 2023-12-19 DIAGNOSIS — I130 Hypertensive heart and chronic kidney disease with heart failure and stage 1 through stage 4 chronic kidney disease, or unspecified chronic kidney disease: Secondary | ICD-10-CM | POA: Diagnosis not present

## 2023-12-19 DIAGNOSIS — A419 Sepsis, unspecified organism: Secondary | ICD-10-CM | POA: Diagnosis not present

## 2023-12-19 DIAGNOSIS — N390 Urinary tract infection, site not specified: Secondary | ICD-10-CM | POA: Diagnosis not present

## 2023-12-19 DIAGNOSIS — B964 Proteus (mirabilis) (morganii) as the cause of diseases classified elsewhere: Secondary | ICD-10-CM | POA: Diagnosis not present

## 2023-12-19 DIAGNOSIS — T83511D Infection and inflammatory reaction due to indwelling urethral catheter, subsequent encounter: Secondary | ICD-10-CM | POA: Diagnosis not present

## 2023-12-19 NOTE — Telephone Encounter (Signed)
 Copied from CRM [id number]. Topic: General - Other >> Dec 19, 2023  2:34 PM Dyann Glaser G wrote: Reason for CRM: JASMINE WITH [facility] CALLING ABOUT PTS WEIGHT TODAY 248.6 AND OVER THE WEEKEND 249.0. THANKS

## 2023-12-20 DIAGNOSIS — I130 Hypertensive heart and chronic kidney disease with heart failure and stage 1 through stage 4 chronic kidney disease, or unspecified chronic kidney disease: Secondary | ICD-10-CM | POA: Diagnosis not present

## 2023-12-20 DIAGNOSIS — N390 Urinary tract infection, site not specified: Secondary | ICD-10-CM | POA: Diagnosis not present

## 2023-12-20 DIAGNOSIS — T83511D Infection and inflammatory reaction due to indwelling urethral catheter, subsequent encounter: Secondary | ICD-10-CM | POA: Diagnosis not present

## 2023-12-20 DIAGNOSIS — B964 Proteus (mirabilis) (morganii) as the cause of diseases classified elsewhere: Secondary | ICD-10-CM | POA: Diagnosis not present

## 2023-12-20 DIAGNOSIS — A419 Sepsis, unspecified organism: Secondary | ICD-10-CM | POA: Diagnosis not present

## 2023-12-20 NOTE — Telephone Encounter (Signed)
 Ok to continue to monitor for now, tanks

## 2023-12-21 ENCOUNTER — Telehealth: Admitting: *Deleted

## 2023-12-21 ENCOUNTER — Encounter: Payer: Self-pay | Admitting: *Deleted

## 2023-12-22 ENCOUNTER — Other Ambulatory Visit: Payer: Self-pay | Admitting: *Deleted

## 2023-12-22 DIAGNOSIS — A419 Sepsis, unspecified organism: Secondary | ICD-10-CM | POA: Diagnosis not present

## 2023-12-22 DIAGNOSIS — N390 Urinary tract infection, site not specified: Secondary | ICD-10-CM | POA: Diagnosis not present

## 2023-12-22 DIAGNOSIS — T83511D Infection and inflammatory reaction due to indwelling urethral catheter, subsequent encounter: Secondary | ICD-10-CM | POA: Diagnosis not present

## 2023-12-22 DIAGNOSIS — I130 Hypertensive heart and chronic kidney disease with heart failure and stage 1 through stage 4 chronic kidney disease, or unspecified chronic kidney disease: Secondary | ICD-10-CM | POA: Diagnosis not present

## 2023-12-22 DIAGNOSIS — B964 Proteus (mirabilis) (morganii) as the cause of diseases classified elsewhere: Secondary | ICD-10-CM | POA: Diagnosis not present

## 2023-12-22 NOTE — Patient Instructions (Signed)
 Visit Information  Thank you for taking time to visit with me today. Please don't hesitate to contact me if I can be of assistance to you before our next scheduled telephone appointment.  Our next appointment is by telephone on Wednesday 12/28/23 at 1:00 pm  Please call the care guide team at [phone] if you need to cancel or reschedule your appointment.   Following are the goals we discussed today:  Patient Self Care Activities:  Attend all scheduled provider appointments Call provider office for new concerns or questions  Participate in Transition of Care Program/Attend TOC scheduled calls Take medications as prescribed   Continue using home oxygen  as prescribed Continue working with the home health team that is involved in your care Weigh yourself every day to stay on top of early fluid retention: write down your weights every day so you remember what it is from day to day: follow the weight-gain guidelines and action plan to call your doctor if you gain more than 3 lbs overnight, or 5 lbs in one week Continue to monitor the patient's foley catheter regularly and to check his urine daily for cloudiness and amount of urine output- keep this area clean Use assistive devices as needed to prevent falls Continue to monitor the area of redness on patient's (L) bottom: please avoid pressure on the area, and ensure patient is eating a healthy diet that includes protein  If you are experiencing a Mental Health or Behavioral Health Crisis or need someone to talk to, please  call the Suicide and Crisis Lifeline: 988 call the [place]  National Suicide Prevention Lifeline: 586-575-6610 or TTY: (701)582-2866 TTY 216-804-1229) to talk to a trained counselor call 1-800-273-TALK (toll free, 24 hour hotline) go to [facility] [address] [place] [phone]) call the [facility] Crisis Line: 703-065-9190 call 911   Caregiver verbalizes understanding  of instructions and care plan provided today and agrees to view in MyChart. Active MyChart status and patient understanding of how to access instructions and care plan via MyChart confirmed with patient.     Sharan Mcenaney Mckinney Khiry Pasquariello, RN, BSN, Media planner  Transitions of Care  [facility] - [facility] [phone]: direct office

## 2023-12-22 NOTE — Transitions of Care (Post Inpatient/ED Visit) (Signed)
 Transition of Care week 4/ day # 24  Visit Note  12/22/2023  Name: [name] [name] MRN: [id number]          DOB: January 27, 1946  Situation: Patient enrolled in [facility] 30-day program. Visit completed with daughter/ caregiver Thersia Flax by telephone.   HIPAA identifiers x 2 verified with caregiver- verified on [facility] DPR  Background:  Recent hospitalization April 14-18, 2025 for sepsis secondary to complicated UTI in setting of chronic foley catheter/ baseline CHF Multiple recent inpatient/ unplanned hospitalizations: 4 x last 12 months  Initial Transition Care Management Follow-up Telephone Call    Past Medical History:  Diagnosis Date   Acute right-sided CHF (congestive heart failure) (HCC)    a. 01/2013.   Arthritis    "knees and hands; thoracic area of the spine" (01/05/2013)   BRONCHITIS, CHRONIC 01/02/2009   COMMON MIGRAINE    "none since treating for high BP"   Complication of anesthesia    "aspiration pneumonia after hand OR" (01/05/2013)   DIABETES MELLITUS, TYPE II    GOUT    HYPERLIPIDEMIA    HYPERTENSION    Long term (current) use of anticoagulants    Morbid obesity (HCC)    OBESITY HYPOVENTILATION SYNDROME    a. on home O2.   On home oxygen  therapy    OSA (obstructive sleep apnea)    Permanent atrial fibrillation (HCC)    PROSTATE SPECIFIC ANTIGEN, ELEVATED 06/09/2007   Pulmonary HTN (HCC)    a. multifactorial including obstructive sleep apnea, obesity hypoventilation syndrome and possible pulmonary venous hypertension.   SKIN RASH 03/12/2010   Unspecified hearing loss    Assessment:  per caregiver: "he is still doing fine- feeling great and no problems.  Feels fine, no complaints.  Pee looks fine in the catheter- nice clear pale yellow- the home health nurse changed it out this week on Tuesday.   He is still working with home health PT and they say he is making good progress; still uses walker all the time, no falls.  His weights are up from last week, but just barely, no weight gain  over 3 lbs in one night; today he is at 249 lbs, right in his normal range.  Blood pressures are fine, this morning it was 118/70.  Taking his medicine like he is supposed to;"    Caregiver denies clinical concerns and reports patient in no distress, with no physical complaints/ concerns   Patient Reported Symptoms: Cognitive Cognitive Status: Alert and oriented to person, place, and time, Normal speech and language skills, Struggling with memory recall, Requires Assistance Decision Making (per daughter/ caregiver) Cognitive/Intellectual Conditions Management [RPT]: Other Other: per caregiver: "possible dementia starting;" caregiver confirms she manages all aspects of patient's medical care/ affairs   Health Maintenance Behaviors: Annual physical exam  Neurological Neurological Review of Symptoms: Other: Oher Neurological Symptoms/Conditions [RPT]: per caregiver: "possible dementia starting;" caregiver confirms she manages all aspects of patient's medical care/ affairs Neurological Conditions: Dementia Neurological Management Strategies: Medication therapy, Routine screening, Coping strategies  HEENT HEENT Symptoms Reported: No symptoms reported      Cardiovascular Cardiovascular Symptoms Reported: No symptoms reported Does patient have uncontrolled Hypertension?: No Is patient checking Blood Pressure at home?: Yes Patient's Recent BP reading at home: 118/70- today 12/22/23 Cardiovascular Conditions: High blood cholesterol, Hypertension Cardiovascular Management Strategies: Medication therapy, Diet modification, Routine screening Weight: 249 lb (112.9 kg)  Respiratory Respiratory Symptoms Reported: No symptoms reported Other Respiratory Symptoms: Caregiver reports rare episodes of self-limiting shortness of breath, all  resolved "within a minute" with rest; confirms patient continues to use home O2 at 2 L/min "all the time" Additional Respiratory Details: Home O2- 2-3 L/  continuous Respiratory Conditions: COPD, Shortness of breath  Endocrine Patient reports the following symptoms related to hypoglycemia or hyperglycemia : No symptoms reported Is patient diabetic?: No    Gastrointestinal Gastrointestinal Symptoms Reported: No symptoms reported Additional Gastrointestinal Details: caregiver reports "good appetite, eating fine; normal BM's everyday"      Genitourinary Genitourinary Symptoms Reported: No symptoms reported Other Genitourinary Symptoms: Caregiver confirms foley catheter exchange by home health RN on 12/20/23; reports "urine is still clear, pale yellow; looks fine, good amount in catheter bag" Genitourinary Conditions: Difficulty voiding Other Genitourinary Conditions: BPH Genitourinary Management Strategies: Medical device, Medication therapy, Fluid modification, Coping strategies  Integumentary Integumentary Symptoms Reported: No symptoms reported Additional Integumentary Details: Caregiver continues to report: "Skin on his bottom looks fine- no breaks and no redness;" confirmed caregiver continues to monitor/ assess skin daily Skin Management Strategies: Routine screening, Activity, Exercise  Musculoskeletal Musculoskelatal Symptoms Reviewed: Unsteady gait Additional Musculoskeletal Details: per caregiver continues to use walker "all the time" Musculoskeletal Conditions: Mobility limited Musculoskeletal Management Strategies: Medical device, Routine screening, Coping strategies      Psychosocial Psychosocial Symptoms Reported: No symptoms reported (per caregiver/ daughter)         Vitals:   12/22/23 1628  BP: 118/70    Medications Reviewed Today     Reviewed by Davien Malone M, RN (Registered Nurse) on 12/22/23 at 1612  Med List Status: <None>   Medication Order Taking? Sig Documenting Provider Last Dose Status Informant  acetaminophen  (TYLENOL ) 500 MG tablet [id number] No Take 500-1,000 mg by mouth every 6 (six) hours as needed for  moderate pain (pain score 4-6). [provider] Taking Active Nursing Home Medication Administration Guide (MAG)  allopurinol  (ZYLOPRIM ) 100 MG tablet [id number] No Take 1 tablet (100 mg total) by mouth daily. Roslyn Coombe, MD Taking Active   ammonium lactate (LAC-HYDRIN) 12 % lotion [id number] No Apply 1 Application topically as needed for dry skin.  Patient not taking: Reported on 12/07/2023   [provider] Not Taking Active            Med Note Miriam American, Aundra Kerwood Dec 07, 2023 12:52 PM) It's available if needed  amoxicillin -clavulanate (AUGMENTIN ) 875-125 MG tablet [id number] No Take 1 tablet by mouth every 12 (twelve) hours.  Patient not taking: Reported on 12/07/2023   Barbee Lew, MD Not Taking Active            Med Note Miriam American, Aundra Trueheart Dec 07, 2023 12:53 PM) Donnel Gail last week  apixaban  (ELIQUIS ) 5 MG TABS tablet [id number] No Take 1 tablet (5 mg total) by mouth 2 (two) times daily. Lenise Quince, MD Taking Active            Med Note Verdia Glad, Ledon Pry   Tue Nov 22, 2023  8:53 AM) @1800   atorvastatin  (LIPITOR) 20 MG tablet [id number] No Take 1 tablet (20 mg total) by mouth daily. Roslyn Coombe, MD Taking Active   cholecalciferol  (VITAMIN D3) 25 MCG (1000 UNIT) tablet [id number] No Take 2,000 Units by mouth daily. [provider] Taking Active Nursing Home Medication Administration Guide (MAG)  cyanocobalamin  ((VITAMIN B-12)) injection 1,000 mcg [id number]   John, James W, MD  Active            Med Note Errol Heaps, Jkwon Treptow M   Mon  Nov 28, 2023 10:03 AM) 11/28/23- Reports currently taking- needs refill, caregiver reports has April 2025 dose and will give "soon"  cyanocobalamin  (VITAMIN B12) 1000 MCG/ML injection [id number] No Inject 1 mL (1,000 mcg total) into the muscle every 30 (thirty) days. Roslyn Coombe, MD Taking Active   donepezil  (ARICEPT ) 5 MG tablet [id number] No Take 1 tablet (5 mg total) by mouth at bedtime. Roslyn Coombe, MD Taking  Active   glipiZIDE  (GLUCOTROL  XL) 10 MG 24 hr tablet [id number] No TAKE 1 TABLET(10 MG) BY MOUTH DAILY WITH BREAKFAST John, James W, MD Taking Active   metoprolol  tartrate (LOPRESSOR ) 25 MG tablet [id number] No TAKE 1 TABLET(25 MG) BY MOUTH TWICE DAILY Roslyn Coombe, MD Taking Active   nystatin  (MYCOSTATIN /NYSTOP ) powder [id number] No Apply 1 Application topically 2 (two) times daily. Stoneking, Ponce Brisker., MD Taking Active   nystatin  cream (MYCOSTATIN ) [id number] No Apply 1 Application topically 2 (two) times daily. Mellie Sprinkle., MD Taking Active   OXYGEN  [id number] No Inhale 3-5 L into the lungs daily. 3L Daily  4L Resting  5L Exertion [provider] Taking Active Nursing Home Medication Administration Guide (MAG)           Med Note Miriam American, VICTORIA Y   Wed Dec 07, 2023 12:51 PM) Still tolerating 3L  polyethylene glycol (MIRALAX  / GLYCOLAX ) 17 g packet [id number] No Take 17 g by mouth daily as needed.  Patient not taking: Reported on 12/07/2023   Deforest Fast, MD Not Taking Active            Med Note Miriam American, Aundra Augustine Dec 07, 2023 12:53 PM) It's available when needed  spironolactone  (ALDACTONE ) 25 MG tablet [id number] No Take 1 tablet (25 mg total) by mouth daily. Lenise Quince, MD Taking Active   tamsulosin  (FLOMAX ) 0.4 MG CAPS capsule [id number]  TAKE 1 CAPSULE BY MOUTH EVERY DAY AFTER SUPPER Roslyn Coombe, MD  Active   torsemide  (DEMADEX ) 20 MG tablet [id number] No Take 1 tablet (20 mg total) by mouth daily. Lenise Quince, MD Taking Active   Med List Note (White, Tonia S, CPhT 01/31/23 2355): [facility]  [phone]           Recommendation:   Continue established plan of care  Follow Up Plan:   Telephone follow-up in 1 week- as scheduled, for possible TOC case closure if no hospital re-admission  Plan for next week's call: Review home health visits- OT/ PT/ nursing Review ongoing medication adherence Reinforce/ provide education  re: signs/ symptoms UTI along with action plan in setting of chronic foley Reinforce skin care/ foley catheter care/ need to avoid pressure on bottom:  pressure sore prevention Review daily weights since  Wednesday 12/22/23: baseline weights 246-249 lbs/ action plan for weight gain; fluid intake/ diuretics- balance between dehydration/ overload TOC case closure with transfer to longitudinal RN CM if no hospital re-admission and caregiver agreeable  Pls call/ message for questions,  Netanya Yazdani Mckinney Ruby Dilone, RN, BSN, Media planner  Transitions of Care  [facility] - [facility] [phone]: direct office

## 2023-12-26 ENCOUNTER — Telehealth: Payer: Self-pay | Admitting: Internal Medicine

## 2023-12-26 DIAGNOSIS — T83511D Infection and inflammatory reaction due to indwelling urethral catheter, subsequent encounter: Secondary | ICD-10-CM | POA: Diagnosis not present

## 2023-12-26 DIAGNOSIS — N390 Urinary tract infection, site not specified: Secondary | ICD-10-CM | POA: Diagnosis not present

## 2023-12-26 DIAGNOSIS — I130 Hypertensive heart and chronic kidney disease with heart failure and stage 1 through stage 4 chronic kidney disease, or unspecified chronic kidney disease: Secondary | ICD-10-CM | POA: Diagnosis not present

## 2023-12-26 DIAGNOSIS — A419 Sepsis, unspecified organism: Secondary | ICD-10-CM | POA: Diagnosis not present

## 2023-12-26 DIAGNOSIS — B964 Proteus (mirabilis) (morganii) as the cause of diseases classified elsewhere: Secondary | ICD-10-CM | POA: Diagnosis not present

## 2023-12-26 NOTE — Telephone Encounter (Signed)
 Ok for [employer]

## 2023-12-26 NOTE — Telephone Encounter (Signed)
 Copied from CRM [id number]. Topic: Clinical - Home Health Verbal Orders >> Dec 26, 2023  2:38 PM Alyse July wrote: Caller/Agency: Monique/ [facility] Callback Number: [phone] Service Requested: Physical Therapy Frequency: Once a week for 4 weeks Any new concerns about the patient? No

## 2023-12-27 DIAGNOSIS — N390 Urinary tract infection, site not specified: Secondary | ICD-10-CM | POA: Diagnosis not present

## 2023-12-27 DIAGNOSIS — T83511D Infection and inflammatory reaction due to indwelling urethral catheter, subsequent encounter: Secondary | ICD-10-CM | POA: Diagnosis not present

## 2023-12-27 DIAGNOSIS — A419 Sepsis, unspecified organism: Secondary | ICD-10-CM | POA: Diagnosis not present

## 2023-12-27 DIAGNOSIS — B964 Proteus (mirabilis) (morganii) as the cause of diseases classified elsewhere: Secondary | ICD-10-CM | POA: Diagnosis not present

## 2023-12-27 DIAGNOSIS — I130 Hypertensive heart and chronic kidney disease with heart failure and stage 1 through stage 4 chronic kidney disease, or unspecified chronic kidney disease: Secondary | ICD-10-CM | POA: Diagnosis not present

## 2023-12-27 NOTE — Telephone Encounter (Signed)
 Called and it is the wrong callback number in the note.

## 2023-12-28 ENCOUNTER — Other Ambulatory Visit: Payer: Self-pay | Admitting: *Deleted

## 2023-12-28 VITALS — Wt 248.0 lb

## 2023-12-28 DIAGNOSIS — N390 Urinary tract infection, site not specified: Secondary | ICD-10-CM

## 2023-12-28 DIAGNOSIS — I5032 Chronic diastolic (congestive) heart failure: Secondary | ICD-10-CM

## 2023-12-28 DIAGNOSIS — J9611 Chronic respiratory failure with hypoxia: Secondary | ICD-10-CM

## 2023-12-28 NOTE — Patient Instructions (Signed)
 Visit Information  Thank you for taking time to visit with me today. Please don't hesitate to contact me if I can be of assistance to you before our next scheduled telephone appointment.  It has been a pleasure working with you over the last 30 days!  Great job managing your care after your hospital visit!  I am glad that you are doing well!  Please listen for a call from the new nurse care manager who will pick up in your care where we are leaving off today  Following are the goals we discussed today:  Patient Self Care Activities:  Attend all scheduled provider appointments Call provider office for new concerns or questions  Take medications as prescribed   Continue using home oxygen  as prescribed Continue working with the home health team that is involved in your care Weigh yourself every day to stay on top of early fluid retention: write down your weights every day so you remember what it is from day to day: follow the weight-gain guidelines and action plan to call your doctor if you gain more than 3 lbs overnight, or 5 lbs in one week Continue to monitor the patient's foley catheter regularly and to check his urine daily for cloudiness and amount of urine output- keep this area clean Use assistive devices as needed to prevent falls Continue to monitor the area of redness on patient's (L) bottom: please avoid pressure on the area, and ensure patient is eating a healthy diet that includes protein  If you are experiencing a Mental Health or Behavioral Health Crisis or need someone to talk to, please  call the Suicide and Crisis Lifeline: 988 call the [place]  National Suicide Prevention Lifeline: (514)316-5247 or TTY: (954)779-6474 TTY (424) 467-8281) to talk to a trained counselor call 1-800-273-TALK (toll free, 24 hour hotline) go to [facility] [address], [place] [phone]) call the [facility] Crisis Line: [phone] call 911    Caregiver verbalizes understanding of instructions and care plan provided today and agrees to view in MyChart. Active MyChart status and patient understanding of how to access instructions and care plan via MyChart confirmed with patient.     Birdie Fetty Mckinney Collene Massimino, RN, BSN, Media planner  Transitions of Care  [facility] - [facility] [phone]: direct office

## 2023-12-28 NOTE — Transitions of Care (Post Inpatient/ED Visit) (Signed)
 Transition of Care week # 5/ day # 30-TOC 30-day program case closure  Visit Note  12/28/2023  Name: [name] [name] MRN: [id number]          DOB: 1945/11/08  Situation: Patient enrolled in [facility] 30-day program. Visit completed with patient's caregiver/ daughter Thersia Flax- verified on [facility] DPR by telephone.   HIPAA identifiers x 2 verified  Background:  Recent hospitalization April 14-18, 2025 for sepsis secondary to complicated UTI in setting of chronic foley catheter/ baseline CHF Multiple recent inpatient/ unplanned hospitalizations: 4 x last 12 months  Initial Transition Care Management Follow-up Telephone Call    Past Medical History:  Diagnosis Date   Acute right-sided CHF (congestive heart failure) (HCC)    a. 01/2013.   Arthritis    "knees and hands; thoracic area of the spine" (01/05/2013)   BRONCHITIS, CHRONIC 01/02/2009   COMMON MIGRAINE    "none since treating for high BP"   Complication of anesthesia    "aspiration pneumonia after hand OR" (01/05/2013)   DIABETES MELLITUS, TYPE II    GOUT    HYPERLIPIDEMIA    HYPERTENSION    Long term (current) use of anticoagulants    Morbid obesity (HCC)    OBESITY HYPOVENTILATION SYNDROME    a. on home O2.   On home oxygen  therapy    OSA (obstructive sleep apnea)    Permanent atrial fibrillation (HCC)    PROSTATE SPECIFIC ANTIGEN, ELEVATED 06/09/2007   Pulmonary HTN (HCC)    a. multifactorial including obstructive sleep apnea, obesity hypoventilation syndrome and possible pulmonary venous hypertension.   SKIN RASH 03/12/2010   Unspecified hearing loss    Assessment:  per caregiver: "he is still doing just fine- feeling good and no problems.  He has no complaints.  Pee looks fine in the catheter- nice clear pale yellow, since it was changed last week.  Home health still coming, he is using his walker and no falls.  Skin on his bottom looks fine. He is eating good, has a good appetite and peeing and pooping normally"    Caregiver denies  clinical concerns and reports patient in no distress, with no physical complaints/ concerns  Patient Reported Symptoms: Cognitive Cognitive Status: Alert and oriented to person, place, and time, Struggling with memory recall, Normal speech and language skills (per caregiver report) Cognitive/Intellectual Conditions Management [RPT]: Other Other: "possible dementia" per caregiver ongoing report   Health Maintenance Behaviors: Annual physical exam, Stress management  Neurological Neurological Review of Symptoms: No symptoms reported (per caregiver report- patient is at baseline with "possible dementia") Oher Neurological Symptoms/Conditions [RPT]: per caregiver report- patient at baseline with "possible demetia;" caregiver continues to manage all aspects of patient's medical affairs- he is independent in many self-care activities, resides with caregiver who assists/ supervises all self-care and medical needs Neurological Conditions: Dementia Neurological Management Strategies: Coping strategies, Routine screening, Medication therapy  HEENT HEENT Symptoms Reported: No symptoms reported (per caregiver report)      Cardiovascular Cardiovascular Symptoms Reported: No symptoms reported Does patient have uncontrolled Hypertension?: No Is patient checking Blood Pressure at home?: Yes Patient's Recent BP reading at home: Not available for review today per caregiver report Cardiovascular Conditions: Hypertension Cardiovascular Management Strategies: Medication therapy, Routine screening, Diet modification, Coping strategies Weight: 248 lb (112.5 kg) (home reported weight from 12/28/23)  Respiratory Respiratory Symptoms Reported: No symptoms reported Other Respiratory Symptoms: per caregiver report- "breathing fine, no problems; rests like he always does if he gets short of breath, which is not  often; continues using home O2 all the time at 3 L/min Additional Respiratory Details: Home O2 at baseline:  continuous: 3 L/min    Endocrine Patient reports the following symptoms related to hypoglycemia or hyperglycemia : No symptoms reported Is patient diabetic?: No    Gastrointestinal Gastrointestinal Symptoms Reported: No symptoms reported Additional Gastrointestinal Details: per caregiver reports, "going to ther bathroom normally and regularly"      Genitourinary Genitourinary Symptoms Reported: No symptoms reported Other Genitourinary Symptoms: per caregiver reports, "foley is fine- no problems; I supervise care of it; his pee is nice pale clear yellow and there are no signs or symptoms of infection.  Home Health nurse changed out the foley about a week ago, they are still monitoring it also" Additional Genitourinary Details: Chronic foley catheter at baseline Genitourinary Conditions: Difficulty voiding Genitourinary Management Strategies: Medication therapy, Medical device, Fluid modification, Catheter, indwelling, Coping strategies Indwelling Catheter Inserted: 12/20/23 (by home health RN, per caregiver report)  Integumentary Integumentary Symptoms Reported: No symptoms reported Additional Integumentary Details: per caregiver report, "still no breaks in his skin, I am still monitoring his bottom every day, skin looks good, no problems; keeping him off of his bottom as much as possible and making sure he is walking around the house every day and eating a good diet" Skin Management Strategies: Routine screening, Activity, Coping strategies  Musculoskeletal Musculoskelatal Symptoms Reviewed: Unsteady gait Additional Musculoskeletal Details: per caregiver report- continues using walker "all the time" She denies new/ recent falls Musculoskeletal Conditions: Mobility limited Musculoskeletal Management Strategies: Routine screening, Medical device, Coping strategies      Psychosocial Psychosocial Symptoms Reported: No symptoms reported         There were no vitals filed for this  visit.  Medications Reviewed Today     Reviewed by Josaiah Muhammed M, RN (Registered Nurse) on 12/28/23 at 1301  Med List Status: <None>   Medication Order Taking? Sig Documenting Provider Last Dose Status Informant  acetaminophen  (TYLENOL ) 500 MG tablet [id number] No Take 500-1,000 mg by mouth every 6 (six) hours as needed for moderate pain (pain score 4-6). [provider] Taking Active Nursing Home Medication Administration Guide (MAG)  allopurinol  (ZYLOPRIM ) 100 MG tablet [id number] No Take 1 tablet (100 mg total) by mouth daily. Roslyn Coombe, MD Taking Active   ammonium lactate (LAC-HYDRIN) 12 % lotion [id number] No Apply 1 Application topically as needed for dry skin.  Patient not taking: Reported on 12/07/2023   [provider] Not Taking Active            Med Note Miriam American, Aundra Kerwin Dec 07, 2023 12:52 PM) It's available if needed  amoxicillin -clavulanate (AUGMENTIN ) 875-125 MG tablet [id number] No Take 1 tablet by mouth every 12 (twelve) hours.  Patient not taking: Reported on 12/07/2023   Barbee Lew, MD Not Taking Active            Med Note Miriam American, Aundra [employer]well Dec 07, 2023 12:53 PM) Donnel Gail last week  apixaban  (ELIQUIS ) 5 MG TABS tablet [id number] No Take 1 tablet (5 mg total) by mouth 2 (two) times daily. Lenise Quince, MD Taking Active            Med Note Verdia Glad, Ledon Pry   Tue Nov 22, 2023  8:53 AM) @1800   atorvastatin  (LIPITOR) 20 MG tablet [id number] No Take 1 tablet (20 mg total) by mouth daily. Roslyn Coombe, MD Taking Active   cholecalciferol  (VITAMIN D3) 25 MCG (1000  UNIT) tablet [id number] No Take 2,000 Units by mouth daily. [provider] Taking Active Nursing Home Medication Administration Guide (MAG)  cyanocobalamin  ((VITAMIN B-12)) injection 1,000 mcg [id number]   John, James W, MD  Active            Med Note (Troi Bechtold M   Mon Nov 28, 2023 10:03 AM) 11/28/23- Reports currently taking- needs refill, caregiver reports  has April 2025 dose and will give "soon"  cyanocobalamin  (VITAMIN B12) 1000 MCG/ML injection [id number] No Inject 1 mL (1,000 mcg total) into the muscle every 30 (thirty) days. Roslyn Coombe, MD Taking Active   donepezil  (ARICEPT ) 5 MG tablet [id number] No Take 1 tablet (5 mg total) by mouth at bedtime. Roslyn Coombe, MD Taking Active   glipiZIDE  (GLUCOTROL  XL) 10 MG 24 hr tablet [id number] No TAKE 1 TABLET(10 MG) BY MOUTH DAILY WITH BREAKFAST John, James W, MD Taking Active   metoprolol  tartrate (LOPRESSOR ) 25 MG tablet [id number] No TAKE 1 TABLET(25 MG) BY MOUTH TWICE DAILY Roslyn Coombe, MD Taking Active   nystatin  (MYCOSTATIN /NYSTOP ) powder [id number] No Apply 1 Application topically 2 (two) times daily. Stoneking, Ponce Brisker., MD Taking Active   nystatin  cream (MYCOSTATIN ) [id number] No Apply 1 Application topically 2 (two) times daily. Mellie Sprinkle., MD Taking Active   OXYGEN  [id number] No Inhale 3-5 L into the lungs daily. 3L Daily  4L Resting  5L Exertion [provider] Taking Active Nursing Home Medication Administration Guide (MAG)           Med Note Miriam American, VICTORIA Y   Wed Dec 07, 2023 12:51 PM) Still tolerating 3L  polyethylene glycol (MIRALAX  / GLYCOLAX ) 17 g packet [id number] No Take 17 g by mouth daily as needed.  Patient not taking: Reported on 12/07/2023   Deforest Fast, MD Not Taking Active            Med Note Miriam American, Aundra Covalt Dec 07, 2023 12:53 PM) It's available when needed  spironolactone  (ALDACTONE ) 25 MG tablet [id number] No Take 1 tablet (25 mg total) by mouth daily. Lenise Quince, MD Taking Active   tamsulosin  (FLOMAX ) 0.4 MG CAPS capsule [id number]  TAKE 1 CAPSULE BY MOUTH EVERY DAY AFTER SUPPER Roslyn Coombe, MD  Active   torsemide  (DEMADEX ) 20 MG tablet [id number] No Take 1 tablet (20 mg total) by mouth daily. Lenise Quince, MD Taking Active   Med List Note (White, Tonia S, CPhT 01/31/23 3818): [facility]  [id number]            Recommendation:   Referral to: longitudinal RN CM for ongoing support/ follow up post- TOC 30-day program case closure on 12/28/23  Follow Up Plan:   Referral to RN Case Manager  Pls call/ message for questions,  Erlene Hawks, RN, BSN, CCRN Alumnus RN Care Manager  Transitions of Care  [facility] - [facility] [phone]: direct office

## 2023-12-29 ENCOUNTER — Telehealth: Payer: Self-pay | Admitting: *Deleted

## 2023-12-29 NOTE — Progress Notes (Unsigned)
 Complex Care Management Note Care Guide Note  12/29/2023 Name: [name] [name] MRN: [id number] DOB: Sep 16, 1945   Complex Care Management Outreach Attempts: An unsuccessful telephone outreach was attempted today to offer the patient information about available complex care management services.  Follow Up Plan:  Additional outreach attempts will be made to offer the patient complex care management information and services.   Encounter Outcome:  No Answer  Kandis Ormond, CMA [place]  [facility], [facility] Guide Direct Dial: [phone]  Fax: [phone] Website: [email or web address]

## 2023-12-30 ENCOUNTER — Ambulatory Visit: Payer: Medicare Other | Admitting: Internal Medicine

## 2023-12-30 DIAGNOSIS — A419 Sepsis, unspecified organism: Secondary | ICD-10-CM | POA: Diagnosis not present

## 2023-12-30 DIAGNOSIS — N390 Urinary tract infection, site not specified: Secondary | ICD-10-CM | POA: Diagnosis not present

## 2023-12-30 DIAGNOSIS — I130 Hypertensive heart and chronic kidney disease with heart failure and stage 1 through stage 4 chronic kidney disease, or unspecified chronic kidney disease: Secondary | ICD-10-CM | POA: Diagnosis not present

## 2023-12-30 DIAGNOSIS — T83511D Infection and inflammatory reaction due to indwelling urethral catheter, subsequent encounter: Secondary | ICD-10-CM | POA: Diagnosis not present

## 2023-12-30 DIAGNOSIS — B964 Proteus (mirabilis) (morganii) as the cause of diseases classified elsewhere: Secondary | ICD-10-CM | POA: Diagnosis not present

## 2024-01-04 NOTE — Progress Notes (Unsigned)
 Complex Care Management Note Care Guide Note  01/04/2024 Name: [name] [name] MRN: [id number] DOB: 03/14/1946   Complex Care Management Outreach Attempts: A second unsuccessful outreach was attempted today to offer the patient with information about available complex care management services.  Follow Up Plan:  Additional outreach attempts will be made to offer the patient complex care management information and services.   Encounter Outcome:  No Answer  Kandis Ormond, CMA [place]  [facility], [facility] Guide Direct Dial: [phone]  Fax: [phone] Website: [email or web address]

## 2024-01-05 NOTE — Progress Notes (Signed)
 Complex Care Management Note  Care Guide Note 01/05/2024 Name: [name] [name] MRN: [id number] DOB: [birth year]  [name] [name] is a 78 y.o. year old male who sees Roslyn Coombe, MD for primary care. I reached out to [place] by phone today to offer complex care management services.  [name] [name] was given information about Complex Care Management services today including:   The Complex Care Management services include support from the care team which includes your Nurse Care Manager, Clinical Social Worker, or Pharmacist.  The Complex Care Management team is here to help remove barriers to the health concerns and goals most important to you. Complex Care Management services are voluntary, and the patient may decline or stop services at any time by request to their care team member.   Complex Care Management Consent Status: Patient agreed to services and verbal consent obtained.   Follow up plan:  Telephone appointment with complex care management team member scheduled for:  01/06/2024  Encounter Outcome:  Patient Scheduled  Kandis Ormond, CMA [place]  [facility], [facility] Guide Direct Dial: [phone]  Fax: [phone] Website: [email or web address]

## 2024-01-06 ENCOUNTER — Other Ambulatory Visit: Payer: Self-pay

## 2024-01-06 ENCOUNTER — Encounter: Payer: Self-pay | Admitting: Podiatry

## 2024-01-06 ENCOUNTER — Ambulatory Visit (INDEPENDENT_AMBULATORY_CARE_PROVIDER_SITE_OTHER): Payer: TRICARE For Life (TFL) | Admitting: Podiatry

## 2024-01-06 DIAGNOSIS — M79674 Pain in right toe(s): Secondary | ICD-10-CM

## 2024-01-06 DIAGNOSIS — B351 Tinea unguium: Secondary | ICD-10-CM

## 2024-01-06 DIAGNOSIS — I130 Hypertensive heart and chronic kidney disease with heart failure and stage 1 through stage 4 chronic kidney disease, or unspecified chronic kidney disease: Secondary | ICD-10-CM | POA: Diagnosis not present

## 2024-01-06 DIAGNOSIS — N390 Urinary tract infection, site not specified: Secondary | ICD-10-CM | POA: Diagnosis not present

## 2024-01-06 DIAGNOSIS — B964 Proteus (mirabilis) (morganii) as the cause of diseases classified elsewhere: Secondary | ICD-10-CM | POA: Diagnosis not present

## 2024-01-06 DIAGNOSIS — A419 Sepsis, unspecified organism: Secondary | ICD-10-CM | POA: Diagnosis not present

## 2024-01-06 DIAGNOSIS — E119 Type 2 diabetes mellitus without complications: Secondary | ICD-10-CM | POA: Diagnosis not present

## 2024-01-06 DIAGNOSIS — M79675 Pain in left toe(s): Secondary | ICD-10-CM

## 2024-01-06 DIAGNOSIS — T83511D Infection and inflammatory reaction due to indwelling urethral catheter, subsequent encounter: Secondary | ICD-10-CM | POA: Diagnosis not present

## 2024-01-06 NOTE — Progress Notes (Signed)
 This patient returns to my office for at risk foot care.  This patient requires this care by a professional since this patient will be at risk due to having This patient is unable to cut nails himself since the patient cannot reach his nails.These nails are painful walking and wearing shoes.  This patient presents for at risk foot care today.  General Appearance  Alert, conversant and in no acute stress.  Vascular  Dorsalis pedis and posterior tibial  pulses are  weakly palpable  bilaterally.  Capillary return is within normal limits  bilaterally. Temperature is within normal limits  bilaterally.  Neurologic  Senn-Weinstein monofilament wire test within normal limits  bilaterally. Muscle power within normal limits bilaterally.  Nails Thick disfigured discolored nails with subungual debris  from hallux to fifth toes bilaterally. No evidence of bacterial infection or drainage bilaterally.  Orthopedic  No limitations of motion  feet .  No crepitus or effusions noted.  No bony pathology or digital deformities noted.  Skin  normotropic skin with no porokeratosis noted bilaterally.  No signs of infections or ulcers noted.     Onychomycosis  Pain in right toes  Pain in left toes  Consent was obtained for treatment procedures.   Mechanical debridement of nails 1-5  bilaterally performed with a nail nipper.  Filed with dremel without incident.    Return office visit  3 months                    Told patient to return for periodic foot care and evaluation due to potential at risk complications.   Ruffin Cotton DPM

## 2024-01-06 NOTE — Patient Outreach (Signed)
 Complex Care Management   Visit Note  01/06/2024  Name:  [name] [name] MRN: [id number] DOB: 1946-04-06  Situation: Referral received for Complex Care Management related to UTI in setting of chronic indwelling foley I obtained verbal consent from daughter/caregiver, [name] [name]   . Visit completed with Daughter  on the phone  Background:   Past Medical History:  Diagnosis Date   Acute right-sided CHF (congestive heart failure) (HCC)    a. 01/2013.   Arthritis    "knees and hands; thoracic area of the spine" (01/05/2013)   BRONCHITIS, CHRONIC 01/02/2009   COMMON MIGRAINE    "none since treating for high BP"   Complication of anesthesia    "aspiration pneumonia after hand OR" (01/05/2013)   DIABETES MELLITUS, TYPE II    GOUT    HYPERLIPIDEMIA    HYPERTENSION    Long term (current) use of anticoagulants    Morbid obesity (HCC)    OBESITY HYPOVENTILATION SYNDROME    a. on home O2.   On home oxygen  therapy    OSA (obstructive sleep apnea)    Permanent atrial fibrillation (HCC)    PROSTATE SPECIFIC ANTIGEN, ELEVATED 06/09/2007   Pulmonary HTN (HCC)    a. multifactorial including obstructive sleep apnea, obesity hypoventilation syndrome and possible pulmonary venous hypertension.   SKIN RASH 03/12/2010   Unspecified hearing loss     Assessment: Patient Reported Symptoms:  Cognitive Cognitive Status: Confused or disoriented, Requires Assistance Decision Making, Able to follow simple commands, Normal speech and language skills, Struggling with memory recall      Neurological Neurological Review of Symptoms: No symptoms reported Neurological Conditions: Dementia  HEENT HEENT Symptoms Reported: No symptoms reported      Cardiovascular Cardiovascular Symptoms Reported: No symptoms reported Does patient have uncontrolled Hypertension?: No Is patient checking Blood Pressure at home?: Yes Patient's Recent BP reading at home: 116/65 HR 70 Cardiovascular Conditions: Heart failure,  Hypertension Cardiovascular Management Strategies: Medication therapy, Routine screening Cardiovascular Self-Management Outcome: 4 (good)  Respiratory Respiratory Symptoms Reported: No symptoms reported Additional Respiratory Details: home O2 at 3l/Wheeler supplied by [employer] Respiratory Conditions: COPD Respiratory Self-Management Outcome: 5 (very good)  Endocrine Patient reports the following symptoms related to hypoglycemia or hyperglycemia : No symptoms reported Is patient diabetic?: Yes Is patient checking blood sugars at home?: Yes Endocrine Conditions: Diabetes Endocrine Management Strategies: Medication therapy Endocrine Self-Management Outcome: 5 (very good) Endocrine Comment: 11/22/23 A1C 6.0  Gastrointestinal Gastrointestinal Symptoms Reported: No symptoms reported      Genitourinary Genitourinary Symptoms Reported: No symptoms reported Other Genitourinary Symptoms: indwelling foley, changed by home health([facility]) once a month Genitourinary Conditions: Difficulty voiding  Integumentary Integumentary Symptoms Reported: No symptoms reported Additional Integumentary Details: daughter states, skine is "doing fine, no open wounds"    Musculoskeletal Musculoskelatal Symptoms Reviewed: Muscle pain, Unsteady gait Additional Musculoskeletal Details: uses walker, has home health PT Musculoskeletal Conditions: Mobility limited Musculoskeletal Management Strategies: Medication therapy, Medical device, Routine screening Musculoskeletal Self-Management Outcome: 4 (good) Falls in the past year?: No Patient at Risk for Falls Due to: History of fall(s), Impaired balance/gait Fall risk Follow up: Falls evaluation completed, Falls prevention discussed  Psychosocial Psychosocial Symptoms Reported: Not assessed (unable to assess-patient not available)     Quality of Family Relationships: unable to assess      12/22/2023    2:05 PM  Depression screen PHQ 2/9  Decreased Interest 0  Down,  Depressed, Hopeless 0  PHQ - 2 Score 0    Vitals:   01/06/24  1119  BP: 116/65  Pulse: 70    Medications Reviewed Today     Reviewed by [name] Salomone M, RN (Registered Nurse) on 01/06/24 at 1123  Med List Status: <None>   Medication Order Taking? Sig Documenting Provider Last Dose Status Informant  acetaminophen  (TYLENOL ) 500 MG tablet [id number] Yes Take 500-1,000 mg by mouth every 6 (six) hours as needed for moderate pain (pain score 4-6). [provider] Taking Active Nursing Home Medication Administration Guide (MAG)  allopurinol  (ZYLOPRIM ) 100 MG tablet [id number] Yes Take 1 tablet (100 mg total) by mouth daily. [name] Coombe, MD Taking Active   ammonium lactate (LAC-HYDRIN) 12 % lotion [id number] Yes Apply 1 Application topically as needed for dry skin. [provider] Taking Active            Med Note [name] [name], [name] [name]   Wed Dec 07, 2023 12:52 PM) It's available if needed  amoxicillin -clavulanate (AUGMENTIN ) 875-125 MG tablet [id number] No Take 1 tablet by mouth every 12 (twelve) hours.  Patient not taking: Reported on 01/06/2024   Barbee Lew, MD Not Taking Active            Med Note [name] [name], Aundra Memmer Dec 07, 2023 12:53 PM) [name] [name] last week  apixaban  (ELIQUIS ) 5 MG TABS tablet [id number] Yes Take 1 tablet (5 mg total) by mouth 2 (two) times daily. [name] Quince, MD Taking Active            Med Note [name] [name]   Tue Nov 22, 2023  8:53 AM) @1800   atorvastatin  (LIPITOR) 20 MG tablet [id number] Yes Take 1 tablet (20 mg total) by mouth daily. [name] Coombe, MD Taking Active   cholecalciferol  (VITAMIN D3) 25 MCG (1000 UNIT) tablet [id number] Yes Take 2,000 Units by mouth daily. [provider] Taking Active Nursing Home Medication Administration Guide (MAG)  cyanocobalamin  ((VITAMIN B-12)) injection 1,000 mcg [id number]   John, James W, MD  Active            Med Note ([name] [name]   Mon Nov 28, 2023 10:03 AM) 11/28/23- Reports  currently taking- needs refill, caregiver reports has April 2025 dose and will give "soon"  cyanocobalamin  (VITAMIN B12) 1000 MCG/ML injection [id number] Yes Inject 1 mL (1,000 mcg total) into the muscle every 30 (thirty) days. [name] Coombe, MD Taking Active   donepezil  (ARICEPT ) 5 MG tablet [id number] Yes Take 1 tablet (5 mg total) by mouth at bedtime. [name] Coombe, MD Taking Active   glipiZIDE  (GLUCOTROL  XL) 10 MG 24 hr tablet [id number] Yes TAKE 1 TABLET(10 MG) BY MOUTH DAILY WITH BREAKFAST John, James W, MD Taking Active   metoprolol  tartrate (LOPRESSOR ) 25 MG tablet [id number] Yes TAKE 1 TABLET(25 MG) BY MOUTH TWICE DAILY [name] Coombe, MD Taking Active   nystatin  (MYCOSTATIN /NYSTOP ) powder [id number] Yes Apply 1 Application topically 2 (two) times daily. Stoneking, Ponce Brisker., MD Taking Active   nystatin  cream (MYCOSTATIN ) [id number]  Apply 1 Application topically 2 (two) times daily. Mellie Sprinkle., MD  Active   OXYGEN  [id number] Yes Inhale 3-5 L into the lungs daily. 3L Daily  4L Resting  5L Exertion [provider] Taking Active Nursing Home Medication Administration Guide (MAG)           Med Note [name] [name], [name] [name]   Wed Dec 07, 2023 12:51 PM) Still tolerating 3L  polyethylene glycol (MIRALAX  / GLYCOLAX ) 17 g packet [id number] Yes Take 17 g by mouth daily as  needed. Deforest Fast, MD Taking Active            Med Note [name] [name], Aundra Kimball Dec 07, 2023 12:53 PM) It's available when needed  spironolactone  (ALDACTONE ) 25 MG tablet [id number] Yes Take 1 tablet (25 mg total) by mouth daily. [name] Quince, MD Taking Active   tamsulosin  (FLOMAX ) 0.4 MG CAPS capsule [id number] Yes TAKE 1 CAPSULE BY MOUTH EVERY DAY AFTER SUPPER [name] Coombe, MD Taking Active   torsemide  (DEMADEX ) 20 MG tablet [id number] Yes Take 1 tablet (20 mg total) by mouth daily. [name] Quince, MD Taking Active   Med List Note (White, Tonia S, CPhT 01/31/23 6295): [facility]  [id number]          Recommendation:   Patient to continue to work with therapist as recommended, home health nurse changes foley once a month.  Follow Up Plan:   Telephone follow up appointment date/time:  01/26/24 at 9am  Lindi Revering, RN, MSN, BSN, CCM [place]  [facility], Population Health Case Manager Phone: [phone]

## 2024-01-06 NOTE — Patient Instructions (Addendum)
 Visit Information  Thank you for taking time to visit with me today. Please don't hesitate to contact me if I can be of assistance to you before our next scheduled appointment.  Our next appointment is by telephone on 01/26/24 at 9:00 am Please call the care guide team at [phone] if you need to cancel or reschedule your appointment.   Following is a copy of your care plan:   Goals Addressed             This Visit's Progress    VBCI RN Care Plan       Problems:  Chronic Disease Management support and education needs related to UTI in setting of chronic foley  Goal: Over the next 90 days the Caregiver will attend all scheduled medical appointments: urology on 01/13/24 as evidenced by patient report or review of chart        demonstrate a decrease UTI in exacerbations as evidenced by caregiver/patient report or review of chart take all medications exactly as prescribed and will call provider for medication related questions as evidenced by patient/caregiver report or review of chart    verbalize understanding of plan for management of UTI as evidenced by caregiver/patient report or review of chart  Interventions:   Evaluation of current treatment plan related to UTI,   self-management and patient's adherence to plan as established by provider. Discussed plans with patient for ongoing care management follow up and provided patient with direct contact information for care management team Advised patient to continue to monitor for signs/symptoms of UTI Discussed skin care/foley care with daughter   Patient Self-Care Activities:  Attend all scheduled provider appointments Call provider office for new concerns or questions  Take medications as prescribed   Work with home heatlh therapist as recommended Continue to work with [facility] nurse [facility] regarding foley care and change of foley cath as directed by provider.  Plan:  The care management team will reach out to the patient again  over the next 30 days.      Please call the Suicide and Crisis Lifeline: 988 call the [place]  National Suicide Prevention Lifeline: 847-176-8163 or TTY: 604-249-0035 TTY 4783886875) to talk to a trained counselor if you are experiencing a Mental Health or Behavioral Health Crisis or need someone to talk to.  Patient verbalizes understanding of instructions and care plan provided today and agrees to view in MyChart. Active MyChart status and patient understanding of how to access instructions and care plan via MyChart confirmed with patient.     Lindi Revering, RN, MSN, BSN, CCM [place]  [facility], Population Health Case Manager Phone: [phone]

## 2024-01-11 DIAGNOSIS — B964 Proteus (mirabilis) (morganii) as the cause of diseases classified elsewhere: Secondary | ICD-10-CM | POA: Diagnosis not present

## 2024-01-11 DIAGNOSIS — N390 Urinary tract infection, site not specified: Secondary | ICD-10-CM | POA: Diagnosis not present

## 2024-01-11 DIAGNOSIS — A419 Sepsis, unspecified organism: Secondary | ICD-10-CM | POA: Diagnosis not present

## 2024-01-11 DIAGNOSIS — I130 Hypertensive heart and chronic kidney disease with heart failure and stage 1 through stage 4 chronic kidney disease, or unspecified chronic kidney disease: Secondary | ICD-10-CM | POA: Diagnosis not present

## 2024-01-11 DIAGNOSIS — T83511D Infection and inflammatory reaction due to indwelling urethral catheter, subsequent encounter: Secondary | ICD-10-CM | POA: Diagnosis not present

## 2024-01-13 ENCOUNTER — Ambulatory Visit: Admitting: Urology

## 2024-01-16 DIAGNOSIS — I130 Hypertensive heart and chronic kidney disease with heart failure and stage 1 through stage 4 chronic kidney disease, or unspecified chronic kidney disease: Secondary | ICD-10-CM | POA: Diagnosis not present

## 2024-01-16 DIAGNOSIS — B964 Proteus (mirabilis) (morganii) as the cause of diseases classified elsewhere: Secondary | ICD-10-CM | POA: Diagnosis not present

## 2024-01-16 DIAGNOSIS — T83511D Infection and inflammatory reaction due to indwelling urethral catheter, subsequent encounter: Secondary | ICD-10-CM | POA: Diagnosis not present

## 2024-01-16 DIAGNOSIS — N390 Urinary tract infection, site not specified: Secondary | ICD-10-CM | POA: Diagnosis not present

## 2024-01-16 DIAGNOSIS — A419 Sepsis, unspecified organism: Secondary | ICD-10-CM | POA: Diagnosis not present

## 2024-01-17 ENCOUNTER — Other Ambulatory Visit: Payer: Self-pay

## 2024-01-17 ENCOUNTER — Other Ambulatory Visit: Payer: Self-pay | Admitting: Internal Medicine

## 2024-01-17 DIAGNOSIS — A419 Sepsis, unspecified organism: Secondary | ICD-10-CM | POA: Diagnosis not present

## 2024-01-17 DIAGNOSIS — T83511D Infection and inflammatory reaction due to indwelling urethral catheter, subsequent encounter: Secondary | ICD-10-CM | POA: Diagnosis not present

## 2024-01-17 DIAGNOSIS — I130 Hypertensive heart and chronic kidney disease with heart failure and stage 1 through stage 4 chronic kidney disease, or unspecified chronic kidney disease: Secondary | ICD-10-CM | POA: Diagnosis not present

## 2024-01-17 DIAGNOSIS — N390 Urinary tract infection, site not specified: Secondary | ICD-10-CM | POA: Diagnosis not present

## 2024-01-17 DIAGNOSIS — B964 Proteus (mirabilis) (morganii) as the cause of diseases classified elsewhere: Secondary | ICD-10-CM | POA: Diagnosis not present

## 2024-01-26 ENCOUNTER — Other Ambulatory Visit: Payer: Self-pay

## 2024-01-26 NOTE — Patient Instructions (Signed)
 Visit Information  Thank you for taking time to visit with me today. Please don't hesitate to contact me if I can be of assistance to you before our next scheduled appointment.  Our next appointment is by telephone on 02/29/24 at 9:00 am Please call the care guide team at [phone] if you need to cancel or reschedule your appointment.   Following is a copy of your care plan:   Goals Addressed             This Visit's Progress    VBCI RN Care Plan       Problems:  Chronic Disease Management support and education needs related to UTI in setting of chronic foley  Goal: Over the next 90 days the Caregiver will attend all scheduled medical appointments: urology on 02/24/24 as evidenced by patient report or review of chart        demonstrate a decrease UTI in exacerbations as evidenced by caregiver/patient report or review of chart take all medications exactly as prescribed and will call provider for medication related questions as evidenced by patient/caregiver report or review of chart    verbalize understanding of plan for management of UTI as evidenced by caregiver/patient report or review of chart  Interventions:  Evaluation of current treatment plan related to UTI,   self-management and patient's adherence to plan as established by provider. Discussed plans with patient for ongoing care management follow up and provided patient with direct contact information for care management team Advised patient to continue to work with home health nurse regarding foley care management Provided positive feedback regarding management of patient's health.  Patient Self-Care Activities:  Attend all scheduled provider appointments Call provider office for new concerns or questions  Take medications as prescribed   Continue to work with [facility] nurse [facility] regarding foley care and change of foley cath as directed by provider.  Plan:  Telephone follow up appointment with care management team member  scheduled for:  02/29/24 at 9:00 am      Please call the Suicide and Crisis Lifeline: 988 call the [place]  National Suicide Prevention Lifeline: 762-733-7092 or TTY: 224 612 6121 TTY 469-280-1410) to talk to a trained counselor if you are experiencing a Mental Health or Behavioral Health Crisis or need someone to talk to.  Patient verbalizes understanding of instructions and care plan provided today and agrees to view in MyChart. Active MyChart status and patient understanding of how to access instructions and care plan via MyChart confirmed with patient.     Lindi Revering, RN, MSN, BSN, CCM [place]  [facility], Population Health Case Manager Phone: [phone]

## 2024-01-26 NOTE — Patient Outreach (Signed)
 Complex Care Management   Visit Note  01/26/2024  Name:  [name] [name] MRN: [id number] DOB: [birth year]  Situation: Referral received for Complex Care Management related to UTI in setting of chronic indwelling foley I obtained verbal consent from Caregiver.  Visit completed with [name]  on the phone  Background:   Past Medical History:  Diagnosis Date   Acute right-sided CHF (congestive heart failure) (HCC)    a. 01/2013.   Arthritis    knees and hands; thoracic area of the spine (01/05/2013)   BRONCHITIS, CHRONIC 01/02/2009   COMMON MIGRAINE    none since treating for high BP   Complication of anesthesia    aspiration pneumonia after hand OR (01/05/2013)   DIABETES MELLITUS, TYPE II    GOUT    HYPERLIPIDEMIA    HYPERTENSION    Long term (current) use of anticoagulants    Morbid obesity (HCC)    OBESITY HYPOVENTILATION SYNDROME    a. on home O2.   On home oxygen  therapy    OSA (obstructive sleep apnea)    Permanent atrial fibrillation (HCC)    PROSTATE SPECIFIC ANTIGEN, ELEVATED 06/09/2007   Pulmonary HTN (HCC)    a. multifactorial including obstructive sleep apnea, obesity hypoventilation syndrome and possible pulmonary venous hypertension.   SKIN RASH 03/12/2010   Unspecified hearing loss    Assessment: Patient's daughter reports patient is doing well. Expresses no questions or concerns at this time. Upcoming appointment with urologist on 02/24/24.  Patient Reported Symptoms:  Cognitive Cognitive Status: Able to follow simple commands, Confused or disoriented, Requires Assistance Decision Making      Neurological Neurological Review of Symptoms: No symptoms reported    HEENT HEENT Symptoms Reported: No symptoms reported      Cardiovascular Cardiovascular Symptoms Reported: No symptoms reported Does patient have uncontrolled Hypertension?: No    Respiratory Respiratory Symptoms Reported: No symptoms reported    Endocrine Patient reports the following symptoms  related to hypoglycemia or hyperglycemia : No symptoms reported Is patient diabetic?: Yes Is patient checking blood sugars at home?: Yes    Gastrointestinal Gastrointestinal Symptoms Reported: No symptoms reported      Genitourinary Genitourinary Symptoms Reported: No symptoms reported Other Genitourinary Symptoms: indwelling foley, home health changed folley cath every month    Integumentary Integumentary Symptoms Reported: No symptoms reported    Musculoskeletal Musculoskelatal Symptoms Reviewed: Difficulty walking Additional Musculoskeletal Details: uses walker, daughter reports steady gait with walker        Psychosocial Psychosocial Symptoms Reported: No symptoms reported            12/22/2023    2:05 PM  Depression screen PHQ 2/9  Decreased Interest 0  Down, Depressed, Hopeless 0  PHQ - 2 Score 0    There were no vitals filed for this visit.  Medications Reviewed Today     Reviewed by Jentri Aye M, RN (Registered Nurse) on 01/26/24 at [phone]  Med List Status: <None>   Medication Order Taking? Sig Documenting Provider Last Dose Status Informant  acetaminophen  (TYLENOL ) 500 MG tablet [id number] Yes Take 500-1,000 mg by mouth every 6 (six) hours as needed for moderate pain (pain score 4-6). [provider]  Active Nursing Home Medication Administration Guide (MAG)  allopurinol  (ZYLOPRIM ) 100 MG tablet [id number] Yes Take 1 tablet (100 mg total) by mouth daily. Roslyn Coombe, MD  Active   ammonium lactate (LAC-HYDRIN) 12 % lotion [id number] Yes Apply 1 Application topically as needed for dry skin. [provider]  Active            Med Note Miriam American, VICTORIA Y   Wed Dec 07, 2023 12:52 PM) It's available if needed  amoxicillin -clavulanate (AUGMENTIN ) 875-125 MG tablet [id number]  Take 1 tablet by mouth every 12 (twelve) hours.  Patient not taking: Reported on 01/26/2024   Barbee Lew, MD  Active            Med Note Miriam American, Aundra Auerbach Dec 07, 2023 12:53 PM) Donnel Gail last week  apixaban  (ELIQUIS ) 5 MG TABS tablet [id number] Yes Take 1 tablet (5 mg total) by mouth 2 (two) times daily. Lenise Quince, MD  Active            Med Note Verdia Glad, Ledon Pry   Tue Nov 22, 2023  8:53 AM) @1800   atorvastatin  (LIPITOR) 20 MG tablet [id number] Yes Take 1 tablet (20 mg total) by mouth daily. Roslyn Coombe, MD  Active   cholecalciferol  (VITAMIN D3) 25 MCG (1000 UNIT) tablet [id number] Yes Take 2,000 Units by mouth daily. [provider]  Active Nursing Home Medication Administration Guide (MAG)  cyanocobalamin  ((VITAMIN B-12)) injection 1,000 mcg [id number]   Roslyn Coombe, MD  Active            Med Note (TOUSEY, LAINE M   Mon Nov 28, 2023 10:03 AM) 11/28/23- Reports currently taking- needs refill, caregiver reports has April 2025 dose and will give soon  cyanocobalamin  (VITAMIN B12) 1000 MCG/ML injection [id number] Yes Inject 1 mL (1,000 mcg total) into the muscle every 30 (thirty) days. Roslyn Coombe, MD  Active   donepezil  (ARICEPT ) 5 MG tablet [id number] Yes Take 1 tablet (5 mg total) by mouth at bedtime. Roslyn Coombe, MD  Active   glipiZIDE  (GLUCOTROL  XL) 10 MG 24 hr tablet [id number] Yes TAKE 1 TABLET(10 MG) BY MOUTH DAILY WITH BREAKFAST John, James W, MD  Active   metoprolol  tartrate (LOPRESSOR ) 25 MG tablet [id number] Yes TAKE 1 TABLET(25 MG) BY MOUTH TWICE DAILY Roslyn Coombe, MD  Active   nystatin  (MYCOSTATIN /NYSTOP ) powder [id number] Yes Apply 1 Application topically 2 (two) times daily. Stoneking, Ponce Brisker., MD  Active   nystatin  cream (MYCOSTATIN ) [id number] Yes Apply 1 Application topically 2 (two) times daily. Mellie Sprinkle., MD  Active   OXYGEN  [id number] Yes Inhale 3-5 L into the lungs daily. 3L Daily  4L Resting  5L Exertion [provider]  Active Nursing Home Medication Administration Guide (MAG)           Med Note Miriam American, VICTORIA Y   Wed Dec 07, 2023 12:51 PM) Still tolerating 3L  polyethylene glycol  (MIRALAX  / GLYCOLAX ) 17 g packet [id number] Yes Take 17 g by mouth daily as needed. Deforest Fast, MD  Active            Med Note Miriam American, VICTORIA Y   Wed Dec 07, 2023 12:53 PM) It's available when needed  spironolactone  (ALDACTONE ) 25 MG tablet [id number] Yes Take 1 tablet (25 mg total) by mouth daily. Lenise Quince, MD  Active   tamsulosin  (FLOMAX ) 0.4 MG CAPS capsule [id number] Yes TAKE 1 CAPSULE BY MOUTH EVERY DAY AFTER SUPPER Roslyn Coombe, MD  Active   torsemide  (DEMADEX ) 20 MG tablet [id number] Yes Take 1 tablet (20 mg total) by mouth daily. Lenise Quince, MD  Active   Med List Note Georgeana Kindler 01/31/23 8527): [facility]  [phone]  Recommendation:   Continue Current Plan of Care  Follow Up Plan:   Telephone follow up appointment date/time:  02/29/24 at 9:00 am  Lindi Revering, RN, MSN, BSN, CCM [place]  [facility], Population Health Case Manager Phone: [phone]

## 2024-02-04 ENCOUNTER — Other Ambulatory Visit: Payer: Self-pay | Admitting: Cardiology

## 2024-02-04 DIAGNOSIS — I4821 Permanent atrial fibrillation: Secondary | ICD-10-CM

## 2024-02-06 NOTE — Telephone Encounter (Signed)
 Prescription refill request for Eliquis  received. Indication:afib Last office visit:11/24 Scr:1.91  4/25 Age: 78 Weight:112.5  kg  Prescription refilled

## 2024-02-15 DIAGNOSIS — T83511D Infection and inflammatory reaction due to indwelling urethral catheter, subsequent encounter: Secondary | ICD-10-CM | POA: Diagnosis not present

## 2024-02-15 DIAGNOSIS — B964 Proteus (mirabilis) (morganii) as the cause of diseases classified elsewhere: Secondary | ICD-10-CM | POA: Diagnosis not present

## 2024-02-15 DIAGNOSIS — N390 Urinary tract infection, site not specified: Secondary | ICD-10-CM | POA: Diagnosis not present

## 2024-02-15 DIAGNOSIS — I130 Hypertensive heart and chronic kidney disease with heart failure and stage 1 through stage 4 chronic kidney disease, or unspecified chronic kidney disease: Secondary | ICD-10-CM | POA: Diagnosis not present

## 2024-02-15 DIAGNOSIS — A419 Sepsis, unspecified organism: Secondary | ICD-10-CM | POA: Diagnosis not present

## 2024-02-19 ENCOUNTER — Other Ambulatory Visit: Payer: Self-pay | Admitting: Internal Medicine

## 2024-02-24 ENCOUNTER — Encounter: Payer: Self-pay | Admitting: Urology

## 2024-02-24 ENCOUNTER — Ambulatory Visit (INDEPENDENT_AMBULATORY_CARE_PROVIDER_SITE_OTHER): Admitting: Urology

## 2024-02-24 VITALS — BP 121/67 | HR 71 | Ht 68.0 in | Wt 253.0 lb

## 2024-02-24 DIAGNOSIS — Z87440 Personal history of urinary (tract) infections: Secondary | ICD-10-CM

## 2024-02-24 DIAGNOSIS — R339 Retention of urine, unspecified: Secondary | ICD-10-CM

## 2024-02-24 DIAGNOSIS — Z978 Presence of other specified devices: Secondary | ICD-10-CM

## 2024-02-24 DIAGNOSIS — Z960 Presence of urogenital implants: Secondary | ICD-10-CM

## 2024-02-24 MED ORDER — CEFDINIR 300 MG PO CAPS
300.0000 mg | ORAL_CAPSULE | Freq: Two times a day (BID) | ORAL | 0 refills | Status: AC
Start: 2024-02-24 — End: 2024-03-02

## 2024-02-24 NOTE — Progress Notes (Signed)
 Assessment: 1. Urinary retention   2. Chronic indwelling Foley catheter   3. History of UTI     Plan: Will treat with Cefdinir  x 7 days due to increased sediment in urine Continue foley with monthly changes by Home Health D/C tamsulosin  Nystatin  cream BID to groin areas Nystatin  powder daily to prevent tinea Return to office in 6 months  Chief Complaint:  Chief Complaint  Patient presents with   Urinary Retention    History of Present Illness:  [name] [name] is a 78 y.o. male who is seen for further evaluation of urinary retention with chronic indwelling foley catheter. He presented to the emergency room on 01/12/2023 with dysuria as well as fever.  He was found to have urinary retention and a Foley catheter was placed.  He reports that he did not have any significant urinary symptoms prior to this episode.  No prior history of retention. CT imaging from 01/12/2023 demonstrated left renal cysts, no renal calculi, mild left hydroureteronephrosis, mildly enlarged prostate. Urine culture grew 20K Klebsiella.   He was started on tamsulosin  during his hospital course.  He failed a voiding trial and was discharged on 01/19/2023 with the Foley catheter in place.  He was readmitted to the hospital on 01/30/2023 with hypotension and gross hematuria. Urine culture showed no growth. Renal ultrasound from 01/30/2023 showed no renal mass or hydronephrosis. He again failed a voiding trial during the hospital course.  His Foley was changed on 02/09/2023 and he was discharged to a skilled nursing facility. Urine culture from 04/17/2023 grew >100 K multiple species. At his visit in 10/24, his catheter had been draining well.  No gross hematuria.  No fevers or chills.  He was complaining of a rash with itching in the bilateral groin area.  He was treated with nystatin  cream. Urine culture from 06/14/2023 grew 50-100 K. Proteus.  Treated with cefdinir  x 7 days. He has continued with monthly catheter changes.  At  his visit in January 2025, he continued with a Foley catheter with monthly catheter changes.   His catheter was last changed on 08/29/2023.  He does have some mitten hematuria after the catheter change.  No recent UTI symptoms.  No fevers or chills.  He continues to have some problems with tinea cruris.  This previously resolved with nystatin  cream. He was admitted to the hospital in April 2025 with UTI and sepsis.  Urine culture grew Proteus. CT abdomen and pelvis at that time showed the Foley catheter with the balloon in the posterior urethra, bladder wall thickening.  He returns today for follow-up.  He continues with a Foley catheter.  The catheter was last changed by home health last week.  He has had some increased sediment in the catheter recently.  No recent gross hematuria.  No fevers or chills. Urine culture from 02/18/24 grew 50-100K mixed flora.  Portions of the above documentation were copied from a prior visit for review purposes only.  Past Medical History:  Past Medical History:  Diagnosis Date   Acute right-sided CHF (congestive heart failure) (HCC)    a. 01/2013.   Arthritis    knees and hands; thoracic area of the spine (01/05/2013)   BRONCHITIS, CHRONIC 01/02/2009   COMMON MIGRAINE    none since treating for high BP   Complication of anesthesia    aspiration pneumonia after hand OR (01/05/2013)   DIABETES MELLITUS, TYPE II    GOUT    HYPERLIPIDEMIA    HYPERTENSION  Long term (current) use of anticoagulants    Morbid obesity (HCC)    OBESITY HYPOVENTILATION SYNDROME    a. on home O2.   On home oxygen  therapy    OSA (obstructive sleep apnea)    Permanent atrial fibrillation (HCC)    PROSTATE SPECIFIC ANTIGEN, ELEVATED 06/09/2007   Pulmonary HTN (HCC)    a. multifactorial including obstructive sleep apnea, obesity hypoventilation syndrome and possible pulmonary venous hypertension.   SKIN RASH 03/12/2010   Unspecified hearing loss     Past Surgical History:   Past Surgical History:  Procedure Laterality Date   APPENDECTOMY  1974   APPLICATION OF ROBOTIC ASSISTANCE FOR SPINAL PROCEDURE N/A 11/23/2022   Procedure: APPLICATION OF ROBOTIC ASSISTANCE FOR SPINAL PROCEDURE;  Surgeon: Lanis Pupa, MD;  Location: [facility] OR;  Service: Neurosurgery;  Laterality: N/A;   CARDIOVERSION  05/21/2008; ~ 06/2008   HERNIA REPAIR     INGUINAL HERNIA REPAIR Right    RIGHT HEART CATHETERIZATION N/A 01/08/2013   Procedure: RIGHT HEART CATH;  Surgeon: Aleene JINNY Passe, MD;  Location: [facility] C[facility] LAB;  Service: Cardiovascular;  Laterality: N/A;   TENDON REPAIR Right 2009   lacerated his tendon and pulley Dr. Lorretta   UMBILICAL HERNIA REPAIR      Allergies:  Allergies  Allergen Reactions   Codeine     Altered mental status goofy   Heparin  Rash     Abdominal rash 01/2013    Family History:  Family History  Problem Relation Age of Onset   Alzheimer's disease Father    Prostate cancer Father    Cancer Other        Breast Cancer, <50 yo 1st degree relative   Coronary artery disease Other        Male, 1st degree relative    Social History:  Social History   Tobacco Use   Smoking status: Former    Current packs/day: 0.00    Average packs/day: 1 pack/day for 15.0 years (15.0 ttl pk-yrs)    Types: Cigarettes    Start date: 08/10/1963    Quit date: 08/09/1978    Years since quitting: 45.5   Smokeless tobacco: Never   Tobacco comments:    01/05/2013 quit smoking ~ 40 years ago  Vaping Use   Vaping status: Never Used  Substance Use Topics   Alcohol use: Yes    Alcohol/week: 0.0 standard drinks of alcohol    Comment: 01/05/2013 hasn't had a drink since 1980's; never had problem w/it   Drug use: No    ROS: Constitutional:  Negative for fever, chills, weight loss CV: Negative for chest pain, previous MI, hypertension Respiratory:  Negative for shortness of breath, wheezing, sleep apnea, frequent cough GI:  Negative for nausea, vomiting, bloody stool,  GERD  Physical exam: BP 121/67   Pulse 71   Ht 5' 8 (1.727 m)   Wt 253 lb (114.8 kg)   BMI 38.47 kg/m  GENERAL APPEARANCE: Chronically ill-appearing, on oxygen , NAD HEENT:  Atraumatic, normocephalic, oropharynx clear NEUROLOGIC:  Alert and oriented x 3, CN II-XII grossly intact MENTAL STATUS:  appropriate BACK:  Non-tender to palpation, No CVAT SKIN:  Warm, dry, and intact GU:  foley draining yellow urine with sediment  Results: None

## 2024-02-29 ENCOUNTER — Telehealth

## 2024-02-29 ENCOUNTER — Other Ambulatory Visit: Payer: Self-pay

## 2024-02-29 NOTE — Patient Outreach (Signed)
 Complex Care Management   Visit Note  02/29/2024  Name:  [name] [name] MRN: [id number] DOB: 30-Dec-1945  Situation: Referral received for Complex Care Management related to UTI in setting of chronic indwelling foley. I obtained verbal consent from [facility], daughter/caregiver/dpr.  Visit completed with daughter  on the phone.  Background:   Past Medical History:  Diagnosis Date   Acute right-sided CHF (congestive heart failure) (HCC)    a. 01/2013.   Arthritis    knees and hands; thoracic area of the spine (01/05/2013)   BRONCHITIS, CHRONIC 01/02/2009   COMMON MIGRAINE    none since treating for high BP   Complication of anesthesia    aspiration pneumonia after hand OR (01/05/2013)   DIABETES MELLITUS, TYPE II    GOUT    HYPERLIPIDEMIA    HYPERTENSION    Long term (current) use of anticoagulants    Morbid obesity (HCC)    OBESITY HYPOVENTILATION SYNDROME    a. on home O2.   On home oxygen  therapy    OSA (obstructive sleep apnea)    Permanent atrial fibrillation (HCC)    PROSTATE SPECIFIC ANTIGEN, ELEVATED 06/09/2007   Pulmonary HTN (HCC)    a. multifactorial including obstructive sleep apnea, obesity hypoventilation syndrome and possible pulmonary venous hypertension.   SKIN RASH 03/12/2010   Unspecified hearing loss     Assessment: Daughter reports patient is doing well. She denies any questions or concerns. Declines educational regarding HF, HTN, DM  or any disease process.   Patient Reported Symptoms: Cognitive Cognitive Status:  (per review of chart, memory loss. daughter reports patient has dementia.)      Neurological Neurological Review of Symptoms: No symptoms reported    HEENT HEENT Symptoms Reported: No symptoms reported      Cardiovascular Cardiovascular Symptoms Reported: No symptoms reported Does patient have uncontrolled Hypertension?: No Is patient checking Blood Pressure at home?: Yes Patient's Recent BP reading at home: 108/68 HR 75    Respiratory  Respiratory Symptoms Reported: No symptoms reported Other Respiratory Symptoms: per duaghter patent is without concern or issues at this time Additional Respiratory Details: Home O2 at 3l/[name].    Endocrine Endocrine Symptoms Reported: No symptoms reported Is patient diabetic?: Yes Is patient checking blood sugars at home?: Yes List most recent blood sugar readings, include date and time of day: blood sugar this morning was 120 after eating    Gastrointestinal Gastrointestinal Symptoms Reported: No symptoms reported      Genitourinary Genitourinary Symptoms Reported: Other Other Genitourinary Symptoms: chronic indwelling foley. [facility] engaged and changes indwelling foley monthly. Daughter reports change in color of uring and saw Dr. Roseann. patient currently taking antibiotics for UTI. reviewed signs/symptoms of UTI. discussed importance of good skin care. Genitourinary Management Strategies: Catheter, indwelling  Integumentary Integumentary Symptoms Reported: No symptoms reported Additional Integumentary Details: daughter states patient does not have any problems with his skin    Musculoskeletal Additional Musculoskeletal Details: uses walker to ambulate. Patient has been discharged from home heatlh  physical therapy. Daughter denies any questions or concerns Musculoskeletal Management Strategies: Medication therapy, Medical device Falls in the past year?: No    Psychosocial Psychosocial Symptoms Reported: No symptoms reported            12/22/2023    2:05 PM  Depression screen PHQ 2/9  Decreased Interest 0  Down, Depressed, Hopeless 0  PHQ - 2 Score 0    Vitals:   02/29/24 1225  BP: 108/68  Pulse: 75  Medications Reviewed Today     Reviewed by Marko Skalski M, RN (Registered Nurse) on 02/29/24 at 1203  Med List Status: <None>   Medication Order Taking? Sig Documenting Provider Last Dose Status Informant  acetaminophen  (TYLENOL ) 500 MG tablet [id number]  Yes Take 500-1,000 mg by mouth every 6 (six) hours as needed for moderate pain (pain score 4-6). [provider]  Active Nursing Home Medication Administration Guide (MAG)  allopurinol  (ZYLOPRIM ) 100 MG tablet [id number] Yes Take 1 tablet (100 mg total) by mouth daily. Norleen Lynwood ORN, MD  Active   ammonium lactate (LAC-HYDRIN) 12 % lotion [id number] Yes Apply 1 Application topically as needed for dry skin. [provider]  Active            Med Note LESLY, VICTORIA Y   Wed Dec 07, 2023 12:52 PM) It's available if needed  atorvastatin  (LIPITOR) 20 MG tablet [id number] Yes Take 1 tablet (20 mg total) by mouth daily. Norleen Lynwood ORN, MD  Active   cefdinir  (OMNICEF ) 300 MG capsule [id number] Yes Take 1 capsule (300 mg total) by mouth 2 (two) times daily for 7 days. Stoneking, Adine PARAS., MD  Active   cholecalciferol  (VITAMIN D3) 25 MCG (1000 UNIT) tablet [id number] Yes Take 2,000 Units by mouth daily. [provider]  Active Nursing Home Medication Administration Guide (MAG)  cyanocobalamin  ((VITAMIN B-12)) injection 1,000 mcg [id number]   John, James W, MD  Active            Med Note (TOUSEY, LAINE M   Mon Nov 28, 2023 10:03 AM) 11/28/23- Reports currently taking- needs refill, caregiver reports has April 2025 dose and will give soon  cyanocobalamin  (VITAMIN B12) 1000 MCG/ML injection [id number] Yes Inject 1 mL (1,000 mcg total) into the muscle every 30 (thirty) days. Norleen Lynwood ORN, MD  Active   donepezil  (ARICEPT ) 5 MG tablet [id number] Yes Take 1 tablet (5 mg total) by mouth at bedtime. Norleen Lynwood ORN, MD  Active   ELIQUIS  5 MG TABS tablet [id number] Yes TAKE 1 TABLET TWICE A DAY Pietro Redell RAMAN, MD  Active   glipiZIDE  (GLUCOTROL  XL) 10 MG 24 hr tablet [id number] Yes TAKE 1 TABLET(10 MG) BY MOUTH DAILY WITH BREAKFAST John, James W, MD  Active   metoprolol  tartrate (LOPRESSOR ) 25 MG tablet [id number] Yes TAKE 1 TABLET(25 MG) BY MOUTH TWICE DAILY Norleen Lynwood ORN, MD  Active   nystatin   (MYCOSTATIN /NYSTOP ) powder [id number] Yes Apply 1 Application topically 2 (two) times daily. Stoneking, Adine PARAS., MD  Active   nystatin  cream (MYCOSTATIN ) [id number]  Apply 1 Application topically 2 (two) times daily. Roseann Adine PARAS., MD  Active   OXYGEN  [id number] Yes Inhale 3-5 L into the lungs daily. 3L Daily  4L Resting  5L Exertion [provider]  Active Nursing Home Medication Administration Guide (MAG)           Med Note LESLY, VICTORIA Y   Wed Dec 07, 2023 12:51 PM) Still tolerating 3L  polyethylene glycol (MIRALAX  / GLYCOLAX ) 17 g packet [id number] Yes Take 17 g by mouth daily as needed. Fairy Frames, MD  Active            Med Note LESLY, VICTORIA Y   Wed Dec 07, 2023 12:53 PM) It's available when needed  spironolactone  (ALDACTONE ) 25 MG tablet [id number] Yes Take 1 tablet (25 mg total) by mouth daily. Pietro Redell RAMAN, MD  Active   torsemide  (DEMADEX ) 20 MG tablet [id number] Yes Take 1 tablet (20 mg  total) by mouth daily. Pietro Redell RAMAN, MD  Active   Med List Note Isabel Doneta RAMAN Bishop 01/31/23 9696): [facility]  [phone]          Recommendation:   Continue Current Plan of Care  Follow Up Plan:   Telephone follow up appointment date/time:  03/26/24 at 11:00 am  Heddy Shutter, RN, MSN, BSN, CCM [place]  [facility], Population Health Case Manager Phone: [phone]

## 2024-02-29 NOTE — Patient Instructions (Signed)
 Visit Information  Thank you for taking time to visit with me today. Please don't hesitate to contact me if I can be of assistance to you before our next scheduled appointment.  Your next care management appointment is by telephone on 03/26/24 at 11:00.   Please call the care guide team at [phone] if you need to cancel, schedule, or reschedule an appointment.   Please call the Suicide and Crisis Lifeline: 988 call the [place]  National Suicide Prevention Lifeline: (929)068-4930 or TTY: 608-022-2795 TTY 939-577-4073) to talk to a trained counselor if you are experiencing a Mental Health or Behavioral Health Crisis or need someone to talk to.  Heddy Shutter, RN, MSN, BSN, CCM [place]  [facility], Population Health Case Manager Phone: [phone]

## 2024-03-02 ENCOUNTER — Ambulatory Visit (INDEPENDENT_AMBULATORY_CARE_PROVIDER_SITE_OTHER): Admitting: Internal Medicine

## 2024-03-02 ENCOUNTER — Ambulatory Visit: Payer: Self-pay | Admitting: Internal Medicine

## 2024-03-02 ENCOUNTER — Encounter: Payer: Self-pay | Admitting: Internal Medicine

## 2024-03-02 VITALS — BP 120/72 | HR 62 | Temp 98.2°F | Ht 68.0 in | Wt 250.0 lb

## 2024-03-02 DIAGNOSIS — E538 Deficiency of other specified B group vitamins: Secondary | ICD-10-CM

## 2024-03-02 DIAGNOSIS — I10 Essential (primary) hypertension: Secondary | ICD-10-CM

## 2024-03-02 DIAGNOSIS — E559 Vitamin D deficiency, unspecified: Secondary | ICD-10-CM

## 2024-03-02 DIAGNOSIS — Z7984 Long term (current) use of oral hypoglycemic drugs: Secondary | ICD-10-CM | POA: Diagnosis not present

## 2024-03-02 DIAGNOSIS — E119 Type 2 diabetes mellitus without complications: Secondary | ICD-10-CM

## 2024-03-02 DIAGNOSIS — Z0001 Encounter for general adult medical examination with abnormal findings: Secondary | ICD-10-CM

## 2024-03-02 DIAGNOSIS — R972 Elevated prostate specific antigen [PSA]: Secondary | ICD-10-CM | POA: Diagnosis not present

## 2024-03-02 DIAGNOSIS — E7800 Pure hypercholesterolemia, unspecified: Secondary | ICD-10-CM | POA: Diagnosis not present

## 2024-03-02 DIAGNOSIS — Z0000 Encounter for general adult medical examination without abnormal findings: Secondary | ICD-10-CM | POA: Diagnosis not present

## 2024-03-02 LAB — BASIC METABOLIC PANEL WITH GFR
BUN: 40 mg/dL — ABNORMAL HIGH (ref 6–23)
CO2: 28 meq/L (ref 19–32)
Calcium: 9.2 mg/dL (ref 8.4–10.5)
Chloride: 110 meq/L (ref 96–112)
Creatinine, Ser: 1.8 mg/dL — ABNORMAL HIGH (ref 0.40–1.50)
GFR: 35.68 mL/min — ABNORMAL LOW (ref >60.00)
Glucose, Bld: 107 mg/dL — ABNORMAL HIGH (ref 70–99)
Potassium: 4.5 meq/L (ref 3.5–5.1)
Sodium: 146 meq/L — ABNORMAL HIGH (ref 135–145)

## 2024-03-02 LAB — HEPATIC FUNCTION PANEL
ALT: 12 U/L (ref 0–53)
AST: 11 U/L (ref 0–37)
Albumin: 3.6 g/dL (ref 3.5–5.2)
Alkaline Phosphatase: 77 U/L (ref 39–117)
Bilirubin, Direct: 0.2 mg/dL (ref 0.0–0.3)
Total Bilirubin: 0.9 mg/dL (ref 0.2–1.2)
Total Protein: 6.8 g/dL (ref 6.0–8.3)

## 2024-03-02 LAB — URINALYSIS, ROUTINE W REFLEX MICROSCOPIC
Bilirubin Urine: NEGATIVE
Ketones, ur: NEGATIVE
Nitrite: NEGATIVE
Specific Gravity, Urine: 1.015 (ref 1.000–1.030)
Urine Glucose: NEGATIVE
Urobilinogen, UA: 0.2 (ref 0.0–1.0)
pH: 7.5 (ref 5.0–8.0)

## 2024-03-02 LAB — CBC WITH DIFFERENTIAL/PLATELET
Basophils Absolute: 0.1 K/uL (ref 0.0–0.1)
Basophils Relative: 0.6 % (ref 0.0–3.0)
Eosinophils Absolute: 0.3 K/uL (ref 0.0–0.7)
Eosinophils Relative: 3.6 % (ref 0.0–5.0)
HCT: 40.8 % (ref 39.0–52.0)
Hemoglobin: 13 g/dL (ref 13.0–17.0)
Lymphocytes Relative: 30.6 % (ref 12.0–46.0)
Lymphs Abs: 2.8 K/uL (ref 0.7–4.0)
MCHC: 31.8 g/dL (ref 30.0–36.0)
MCV: 91 fl (ref 78.0–100.0)
Monocytes Absolute: 0.8 K/uL (ref 0.1–1.0)
Monocytes Relative: 8.3 % (ref 3.0–12.0)
Neutro Abs: 5.2 K/uL (ref 1.4–7.7)
Neutrophils Relative %: 56.9 % (ref 43.0–77.0)
Platelets: 252 K/uL (ref 150.0–400.0)
RBC: 4.48 Mil/uL (ref 4.22–5.81)
RDW: 16.6 % — ABNORMAL HIGH (ref 11.5–15.5)
WBC: 9.1 K/uL (ref 4.0–10.5)

## 2024-03-02 LAB — LIPID PANEL
Cholesterol: 88 mg/dL (ref 0–200)
HDL: 37.1 mg/dL — ABNORMAL LOW (ref >39.00)
LDL Cholesterol: 36 mg/dL (ref 0–99)
NonHDL: 50.54
Total CHOL/HDL Ratio: 2
Triglycerides: 72 mg/dL (ref 0.0–149.0)
VLDL: 14.4 mg/dL (ref 0.0–40.0)

## 2024-03-02 LAB — PSA: PSA: 2.51 ng/mL (ref 0.10–4.00)

## 2024-03-02 LAB — MICROALBUMIN / CREATININE URINE RATIO
Creatinine,U: 35.4 mg/dL
Microalb Creat Ratio: 150.6 mg/g — ABNORMAL HIGH (ref 0.0–30.0)
Microalb, Ur: 5.3 mg/dL — ABNORMAL HIGH (ref 0.0–1.9)

## 2024-03-02 LAB — VITAMIN B12: Vitamin B-12: 635 pg/mL (ref 211–911)

## 2024-03-02 LAB — TSH: TSH: 1.31 u[IU]/mL (ref 0.35–5.50)

## 2024-03-02 LAB — VITAMIN D 25 HYDROXY (VIT D DEFICIENCY, FRACTURES): VITD: 64.16 ng/mL (ref 30.00–100.00)

## 2024-03-02 LAB — HEMOGLOBIN A1C: Hgb A1c MFr Bld: 6.3 % (ref 4.6–6.5)

## 2024-03-02 NOTE — Progress Notes (Signed)
 The test results show that your current treatment is OK, as the tests are stable.  Please continue the same plan.  There is no other need for change of treatment or further evaluation based on these results, at this time.  thanks

## 2024-03-02 NOTE — Progress Notes (Signed)
 Patient ID: [name] [name], male   DOB: [birth year], 78 y.o.   MRN: [id number]         Chief Complaint:: wellness exam and Medical Management of Chronic Issues (3 Month Follow Up. Patient currently experiencing UTI but this is being treated by antibiotics)  , low b12, htn, hld,  low vit d, elevated psa       HPI:  [name] [name] is a 78 y.o. male here for wellness exam; for shingrix at pharmacy, pt to call for optho appt soon, o/w up to date                        Also has seen urology last wk, now on cefdinir  course for UTI,  Denies urinary symptoms such as dysuria, frequency, urgency, flank pain, hematuria or n/v, fever, chills, though urine still somewhat cloudy.  Pt denies chest pain, increased sob or doe, wheezing, orthopnea, PND, increased LE swelling, palpitations, dizziness or syncope.   Pt denies polydipsia, polyuria, or new focal neuro s/s.      Wt Readings from Last 3 Encounters:  03/02/24 250 lb (113.4 kg)  02/24/24 253 lb (114.8 kg)  12/28/23 248 lb (112.5 kg)   BP Readings from Last 3 Encounters:  03/02/24 120/72  02/29/24 108/68  02/24/24 121/67   Immunization History  Administered Date(s) Administered   Fluad Quad(high Dose 65+) 05/10/2019, 06/06/2020, 04/16/2021, 04/30/2022   Fluad Trivalent(High Dose 65+) 07/01/2023   H1N1 07/15/2008   Influenza Split 05/17/2011, 06/09/2012   Influenza Whole 05/09/2008, 06/24/2009, 06/26/2010   Influenza, High Dose Seasonal PF 04/15/2015, 04/19/2017, 04/25/2018   Influenza,inj,Quad PF,6+ Mos 04/05/2013, 04/11/2014, 04/13/2016   PFIZER(Purple Top)SARS-COV-2 Vaccination 10/07/2019, 10/31/2019, 08/30/2020   Pneumococcal Conjugate-13 06/01/2013, 10/09/2013   Pneumococcal Polysaccharide-23 02/07/2008, 10/19/2017   Td 02/07/2008   Tdap 04/25/2018   Zoster Recombinant(Shingrix) 04/06/2019   Zoster, Live 07/02/2008   Health Maintenance Due  Topic Date Due   Medicare Annual Wellness (AWV)  Never done   Zoster Vaccines- Shingrix (2 of 2) 06/01/2019    OPHTHALMOLOGY EXAM  04/20/2023      Past Medical History:  Diagnosis Date   Acute right-sided CHF (congestive heart failure) (HCC)    a. 01/2013.   Arthritis    knees and hands; thoracic area of the spine (01/05/2013)   BRONCHITIS, CHRONIC 01/02/2009   Chronic kidney disease    COMMON MIGRAINE    none since treating for high BP   Complication of anesthesia    aspiration pneumonia after hand OR (01/05/2013)   DIABETES MELLITUS, TYPE II    GOUT    HYPERLIPIDEMIA    HYPERTENSION    Long term (current) use of anticoagulants    Morbid obesity (HCC)    OBESITY HYPOVENTILATION SYNDROME    a. on home O2.   On home oxygen  therapy    OSA (obstructive sleep apnea)    Oxygen  deficiency    Permanent atrial fibrillation (HCC)    PROSTATE SPECIFIC ANTIGEN, ELEVATED 06/09/2007   Pulmonary HTN (HCC)    a. multifactorial including obstructive sleep apnea, obesity hypoventilation syndrome and possible pulmonary venous hypertension.   SKIN RASH 03/12/2010   Sleep apnea    Unspecified hearing loss    Past Surgical History:  Procedure Laterality Date   APPENDECTOMY  08/09/1972   APPLICATION OF ROBOTIC ASSISTANCE FOR SPINAL PROCEDURE N/A 11/23/2022   Procedure: APPLICATION OF ROBOTIC ASSISTANCE FOR SPINAL PROCEDURE;  Surgeon: Lanis Pupa, MD;  Location: [facility] OR;  Service: Neurosurgery;  Laterality: N/A;   CARDIOVERSION  05/21/2008; ~ 06/2008   FRACTURE SURGERY  2024   HERNIA REPAIR     INGUINAL HERNIA REPAIR Right    RIGHT HEART CATHETERIZATION N/A 01/08/2013   Procedure: RIGHT HEART CATH;  Surgeon: Aleene JINNY Passe, MD;  Location: [facility];  Service: Cardiovascular;  Laterality: N/A;   SPINE SURGERY     TENDON REPAIR Right 08/10/2007   lacerated his tendon and pulley Dr. Lorretta   UMBILICAL HERNIA REPAIR      reports that he quit smoking about 45 years ago. His smoking use included cigarettes. He started smoking about 60 years ago. He has a 15 pack-year smoking history. He  has never used smokeless tobacco. He reports current alcohol use. He reports that he does not use drugs. family history includes Alzheimer's disease in his father; Cancer in an other family member; Coronary artery disease in an other family member; Hearing loss in his brother; Obesity in his brother; Prostate cancer in his father; Stroke in his father. Allergies  Allergen Reactions   Codeine     Altered mental status goofy   Heparin  Rash     Abdominal rash 01/2013   Current Outpatient Medications on File Prior to Visit  Medication Sig Dispense Refill   acetaminophen  (TYLENOL ) 500 MG tablet Take 500-1,000 mg by mouth every 6 (six) hours as needed for moderate pain (pain score 4-6).     allopurinol  (ZYLOPRIM ) 100 MG tablet Take 1 tablet (100 mg total) by mouth daily. 90 tablet 3   ammonium lactate (LAC-HYDRIN) 12 % lotion Apply 1 Application topically as needed for dry skin.     atorvastatin  (LIPITOR) 20 MG tablet Take 1 tablet (20 mg total) by mouth daily. 90 tablet 3   cholecalciferol  (VITAMIN D3) 25 MCG (1000 UNIT) tablet Take 2,000 Units by mouth daily.     cyanocobalamin  (VITAMIN B12) 1000 MCG/ML injection Inject 1 mL (1,000 mcg total) into the muscle every 30 (thirty) days. 3 mL 3   donepezil  (ARICEPT ) 5 MG tablet Take 1 tablet (5 mg total) by mouth at bedtime. 90 tablet 3   ELIQUIS  5 MG TABS tablet TAKE 1 TABLET TWICE A DAY 180 tablet 3   glipiZIDE  (GLUCOTROL  XL) 10 MG 24 hr tablet TAKE 1 TABLET(10 MG) BY MOUTH DAILY WITH BREAKFAST 90 tablet 3   metoprolol  tartrate (LOPRESSOR ) 25 MG tablet TAKE 1 TABLET(25 MG) BY MOUTH TWICE DAILY 180 tablet 3   nystatin  (MYCOSTATIN /NYSTOP ) powder Apply 1 Application topically 2 (two) times daily. 60 g 3   nystatin  cream (MYCOSTATIN ) Apply 1 Application topically 2 (two) times daily. 30 g 4   OXYGEN  Inhale 3-5 L into the lungs daily. 3L Daily  4L Resting  5L Exertion     polyethylene glycol (MIRALAX  / GLYCOLAX ) 17 g packet Take 17 g by mouth daily as  needed. 14 each 0   spironolactone  (ALDACTONE ) 25 MG tablet Take 1 tablet (25 mg total) by mouth daily. 90 tablet 3   torsemide  (DEMADEX ) 20 MG tablet Take 1 tablet (20 mg total) by mouth daily. 90 tablet 1   Current Facility-Administered Medications on File Prior to Visit  Medication Dose Route Frequency Provider Last Rate Last Admin   cyanocobalamin  ((VITAMIN B-12)) injection 1,000 mcg  1,000 mcg Intramuscular Q30 days Norleen Lynwood ORN, MD   1,000 mcg at 08/06/21 0947        ROS:  All others reviewed and negative.  Objective  PE:  BP 120/72   Pulse 62   Temp 98.2 F (36.8 C)   Ht 5' 8 (1.727 m)   Wt 250 lb (113.4 kg)   SpO2 99%   BMI 38.01 kg/m                 Constitutional: Pt appears in NAD               HENT: Head: NCAT.                Right Ear: External ear normal.                 Left Ear: External ear normal.                Eyes: . Pupils are equal, round, and reactive to light. Conjunctivae and EOM are normal               Nose: without d/c or deformity               Neck: Neck supple. Gross normal ROM               Cardiovascular: Normal rate and regular rhythm.                 Pulmonary/Chest: Effort normal and breath sounds without rales or wheezing.                Abd:  Soft, NT, ND, + BS, no organomegaly               Neurological: Pt is alert. At baseline orientation, motor grossly intact               Skin: Skin is warm. No rashes, no other new lesions, LE edema - chronic 1+ bialteral               Psychiatric: Pt behavior is normal without agitation   Micro: none  Cardiac tracings I have personally interpreted today:  none  Pertinent Radiological findings (summarize): none   Lab Results  Component Value Date   WBC 9.1 03/02/2024   HGB 13.0 03/02/2024   HCT 40.8 03/02/2024   PLT 252.0 03/02/2024   GLUCOSE 107 (H) 03/02/2024   CHOL 88 03/02/2024   TRIG 72.0 03/02/2024   HDL 37.10 (L) 03/02/2024   LDLCALC 36 03/02/2024   ALT 12 03/02/2024    AST 11 03/02/2024   NA 146 (H) 03/02/2024   K 4.5 03/02/2024   CL 110 03/02/2024   CREATININE 1.80 (H) 03/02/2024   BUN 40 (H) 03/02/2024   CO2 28 03/02/2024   TSH 1.31 03/02/2024   PSA 2.51 03/02/2024   INR 1.3 (H) 11/21/2023   HGBA1C 6.3 03/02/2024   MICROALBUR 5.3 (H) 03/02/2024   Assessment/Plan:  [name] [name] is a 78 y.o. White or Caucasian [1] male with  has a past medical history of Acute right-sided CHF (congestive heart failure) (HCC), Arthritis, BRONCHITIS, CHRONIC (01/02/2009), Chronic kidney disease, COMMON MIGRAINE, Complication of anesthesia, DIABETES MELLITUS, TYPE II, GOUT, HYPERLIPIDEMIA, HYPERTENSION, Long term (current) use of anticoagulants, Morbid obesity (HCC), OBESITY HYPOVENTILATION SYNDROME, On home oxygen  therapy, OSA (obstructive sleep apnea), Oxygen  deficiency, Permanent atrial fibrillation (HCC), PROSTATE SPECIFIC ANTIGEN, ELEVATED (06/09/2007), Pulmonary HTN (HCC), SKIN RASH (03/12/2010), Sleep apnea, and Unspecified hearing loss.  Encounter for well adult exam with abnormal findings Age and sex appropriate education and counseling updated with regular exercise and diet Referrals for preventative services - pt to call  for eye appt soon Immunizations addressed - for shingrix at pharmacy Smoking counseling  - none needed Evidence for depression or other mood disorder - none significant Most recent labs reviewed. I have personally reviewed and have noted: 1) the patient's medical and social history 2) The patient's current medications and supplements 3) The patient's height, weight, and BMI have been recorded in the chart   B12 deficiency Lab Results  Component Value Date   VITAMINB12 635 03/02/2024   Stable, cont oral replacement - b12 1000 mcg qd   Essential hypertension BP Readings from Last 3 Encounters:  03/02/24 120/72  02/29/24 108/68  02/24/24 121/67   Stable, pt to continue medical treatment lopressor  25 bid, aldactone  25  bid   Hyperlipidemia Lab Results  Component Value Date   LDLCALC 36 03/02/2024   Stable, pt to continue current statin - diet, wt control   Non-insulin  dependent type 2 diabetes mellitus (HCC) Lab Results  Component Value Date   HGBA1C 6.3 03/02/2024   Stable, pt to continue current medical treatment glucotrol  xl 10 mg every day,    PROSTATE SPECIFIC ANTIGEN, ELEVATED Lab Results  Component Value Date   PSA 2.51 03/02/2024   PSA 1.62 10/15/2021   PSA 1.87 04/16/2021   Somewhat increased, pt has urology f/u planned  Vitamin D  deficiency Last vitamin D  Lab Results  Component Value Date   VD25OH 64.16 03/02/2024   Stable, cont oral replacement  Followup: Return in about 6 months (around 09/02/2024).  Lynwood Rush, MD 03/04/2024 9:36 AM [facility]

## 2024-03-02 NOTE — Patient Instructions (Addendum)
 Please remember to call for your yearly eye exam  Please have your Shingrix (shingles) shots done at your local pharmacy.  Please continue all other medications as before, and refills have been done if requested.  Please have the pharmacy call with any other refills you may need.  Please continue your efforts at being more active, low cholesterol diet, and weight control.  You are otherwise up to date with prevention measures today.  Please keep your appointments with your specialists as you may have planned  Please go to the LAB at the blood drawing area for the tests to be done  You will be contacted by phone if any changes need to be made immediately.  Otherwise, you will receive a letter about your results with an explanation, but please check with MyChart first.  Please make an Appointment to return in 6 months, or sooner if needed

## 2024-03-04 ENCOUNTER — Encounter: Payer: Self-pay | Admitting: Internal Medicine

## 2024-03-04 NOTE — Assessment & Plan Note (Signed)
 Age and sex appropriate education and counseling updated with regular exercise and diet Referrals for preventative services - pt to call for eye appt soon Immunizations addressed - for shingrix at pharmacy Smoking counseling  - none needed Evidence for depression or other mood disorder - none significant Most recent labs reviewed. I have personally reviewed and have noted: 1) the patient's medical and social history 2) The patient's current medications and supplements 3) The patient's height, weight, and BMI have been recorded in the chart

## 2024-03-04 NOTE — Assessment & Plan Note (Signed)
 Last vitamin D  Lab Results  Component Value Date   VD25OH 64.16 03/02/2024   Stable, cont oral replacement

## 2024-03-04 NOTE — Assessment & Plan Note (Signed)
 Lab Results  Component Value Date   VITAMINB12 635 03/02/2024   Stable, cont oral replacement - b12 1000 mcg qd

## 2024-03-04 NOTE — Assessment & Plan Note (Signed)
 Lab Results  Component Value Date   LDLCALC 36 03/02/2024   Stable, pt to continue current statin - diet, wt control

## 2024-03-04 NOTE — Assessment & Plan Note (Signed)
 BP Readings from Last 3 Encounters:  03/02/24 120/72  02/29/24 108/68  02/24/24 121/67   Stable, pt to continue medical treatment lopressor  25 bid, aldactone  25 bid

## 2024-03-04 NOTE — Assessment & Plan Note (Signed)
 Lab Results  Component Value Date   PSA 2.51 03/02/2024   PSA 1.62 10/15/2021   PSA 1.87 04/16/2021   Somewhat increased, pt has urology f/u planned

## 2024-03-04 NOTE — Assessment & Plan Note (Signed)
 Lab Results  Component Value Date   HGBA1C 6.3 03/02/2024   Stable, pt to continue current medical treatment glucotrol  xl 10 mg every day,

## 2024-03-10 DIAGNOSIS — B961 Klebsiella pneumoniae [K. pneumoniae] as the cause of diseases classified elsewhere: Secondary | ICD-10-CM

## 2024-03-10 DIAGNOSIS — I5033 Acute on chronic diastolic (congestive) heart failure: Secondary | ICD-10-CM

## 2024-03-10 DIAGNOSIS — E662 Morbid (severe) obesity with alveolar hypoventilation: Secondary | ICD-10-CM

## 2024-03-10 DIAGNOSIS — N390 Urinary tract infection, site not specified: Secondary | ICD-10-CM | POA: Diagnosis not present

## 2024-03-10 DIAGNOSIS — J9621 Acute and chronic respiratory failure with hypoxia: Secondary | ICD-10-CM | POA: Diagnosis not present

## 2024-03-10 DIAGNOSIS — Z6837 Body mass index (BMI) 37.0-37.9, adult: Secondary | ICD-10-CM

## 2024-03-10 DIAGNOSIS — Z7984 Long term (current) use of oral hypoglycemic drugs: Secondary | ICD-10-CM

## 2024-03-10 DIAGNOSIS — I50811 Acute right heart failure: Secondary | ICD-10-CM | POA: Diagnosis not present

## 2024-03-10 DIAGNOSIS — M109 Gout, unspecified: Secondary | ICD-10-CM

## 2024-03-10 DIAGNOSIS — N1832 Chronic kidney disease, stage 3b: Secondary | ICD-10-CM | POA: Diagnosis not present

## 2024-03-10 DIAGNOSIS — B964 Proteus (mirabilis) (morganii) as the cause of diseases classified elsewhere: Secondary | ICD-10-CM | POA: Diagnosis not present

## 2024-03-10 DIAGNOSIS — T83511D Infection and inflammatory reaction due to indwelling urethral catheter, subsequent encounter: Secondary | ICD-10-CM | POA: Diagnosis not present

## 2024-03-10 DIAGNOSIS — I130 Hypertensive heart and chronic kidney disease with heart failure and stage 1 through stage 4 chronic kidney disease, or unspecified chronic kidney disease: Secondary | ICD-10-CM

## 2024-03-10 DIAGNOSIS — Z9981 Dependence on supplemental oxygen: Secondary | ICD-10-CM

## 2024-03-10 DIAGNOSIS — Z7901 Long term (current) use of anticoagulants: Secondary | ICD-10-CM

## 2024-03-10 DIAGNOSIS — E1122 Type 2 diabetes mellitus with diabetic chronic kidney disease: Secondary | ICD-10-CM | POA: Diagnosis not present

## 2024-03-10 DIAGNOSIS — J449 Chronic obstructive pulmonary disease, unspecified: Secondary | ICD-10-CM | POA: Diagnosis not present

## 2024-03-14 DIAGNOSIS — N390 Urinary tract infection, site not specified: Secondary | ICD-10-CM | POA: Diagnosis not present

## 2024-03-14 DIAGNOSIS — A419 Sepsis, unspecified organism: Secondary | ICD-10-CM | POA: Diagnosis not present

## 2024-03-14 DIAGNOSIS — B964 Proteus (mirabilis) (morganii) as the cause of diseases classified elsewhere: Secondary | ICD-10-CM | POA: Diagnosis not present

## 2024-03-14 DIAGNOSIS — T83511D Infection and inflammatory reaction due to indwelling urethral catheter, subsequent encounter: Secondary | ICD-10-CM | POA: Diagnosis not present

## 2024-03-26 ENCOUNTER — Telehealth: Payer: Self-pay

## 2024-03-26 NOTE — Patient Instructions (Signed)
 Emily D Jama - I am sorry I was unable to reach you today for our scheduled appointment. I work with Norleen Lynwood ORN, MD and am calling to support your healthcare needs. Please contact me at [phone] at your earliest convenience. I look forward to speaking with you soon.   Thank you,   Heddy Shutter, RN, MSN, BSN, CCM [place]  [facility] [place], Population Health Case Manager Phone: [phone]

## 2024-04-13 ENCOUNTER — Encounter: Payer: Self-pay | Admitting: Podiatry

## 2024-04-13 ENCOUNTER — Ambulatory Visit (INDEPENDENT_AMBULATORY_CARE_PROVIDER_SITE_OTHER): Admitting: Podiatry

## 2024-04-13 DIAGNOSIS — B351 Tinea unguium: Secondary | ICD-10-CM | POA: Diagnosis not present

## 2024-04-13 DIAGNOSIS — E119 Type 2 diabetes mellitus without complications: Secondary | ICD-10-CM

## 2024-04-13 DIAGNOSIS — M79675 Pain in left toe(s): Secondary | ICD-10-CM | POA: Diagnosis not present

## 2024-04-13 DIAGNOSIS — M79674 Pain in right toe(s): Secondary | ICD-10-CM

## 2024-04-13 NOTE — Progress Notes (Signed)
 This patient returns to my office for at risk foot care.  This patient requires this care by a professional since this patient will be at risk due to having This patient is unable to cut nails himself since the patient cannot reach his nails.These nails are painful walking and wearing shoes.  This patient presents for at risk foot care today.  General Appearance  Alert, conversant and in no acute stress.  Vascular  Dorsalis pedis and posterior tibial  pulses are  weakly palpable  bilaterally.  Capillary return is within normal limits  bilaterally. Temperature is within normal limits  bilaterally.  Neurologic  Senn-Weinstein monofilament wire test within normal limits  bilaterally. Muscle power within normal limits bilaterally.  Nails Thick disfigured discolored nails with subungual debris  from hallux to fifth toes bilaterally. No evidence of bacterial infection or drainage bilaterally.  Orthopedic  No limitations of motion  feet .  No crepitus or effusions noted.  No bony pathology or digital deformities noted.  Skin  normotropic skin with no porokeratosis noted bilaterally.  No signs of infections or ulcers noted.     Onychomycosis  Pain in right toes  Pain in left toes  Consent was obtained for treatment procedures.   Mechanical debridement of nails 1-5  bilaterally performed with a nail nipper.  Filed with dremel without incident.    Return office visit  3 months                    Told patient to return for periodic foot care and evaluation due to potential at risk complications.   Ruffin Cotton DPM

## 2024-04-20 ENCOUNTER — Ambulatory Visit: Attending: Physician Assistant | Admitting: Physician Assistant

## 2024-04-20 ENCOUNTER — Encounter: Payer: Self-pay | Admitting: Physician Assistant

## 2024-04-20 VITALS — BP 110/74 | HR 76 | Ht 68.0 in | Wt 254.4 lb

## 2024-04-20 DIAGNOSIS — J9611 Chronic respiratory failure with hypoxia: Secondary | ICD-10-CM | POA: Diagnosis not present

## 2024-04-20 DIAGNOSIS — N1832 Chronic kidney disease, stage 3b: Secondary | ICD-10-CM

## 2024-04-20 DIAGNOSIS — I4821 Permanent atrial fibrillation: Secondary | ICD-10-CM

## 2024-04-20 DIAGNOSIS — J9612 Chronic respiratory failure with hypercapnia: Secondary | ICD-10-CM

## 2024-04-20 DIAGNOSIS — E119 Type 2 diabetes mellitus without complications: Secondary | ICD-10-CM

## 2024-04-20 DIAGNOSIS — I5032 Chronic diastolic (congestive) heart failure: Secondary | ICD-10-CM | POA: Diagnosis not present

## 2024-04-20 NOTE — Patient Instructions (Addendum)
 Thank you for choosing [name]!     Medication Instructions:  No medication changes were made during today's visit.  *If you need a refill on your cardiac medications before your next appointment, please call your pharmacy*   Lab Work: Return in 4 to 5 months for lab draw............................. BMET If you have labs (blood work) drawn today and your tests are completely normal, you will receive your results only by: MyChart Message (if you have MyChart) OR A paper copy in the mail If you have any lab test that is abnormal or we need to change your treatment, we will call you to review the results.   Testing/Procedures: No procedures were ordered during today's visit.   Your next appointment:   6-8 month(s)   Provider:   Redell Shallow, MD     Follow-Up: At [facility], you and your health needs are our priority.  As part of our continuing mission to provide you with exceptional heart care, we have created designated Provider Care Teams.  These Care Teams include your primary Cardiologist (physician) and Advanced Practice Providers (APPs -  Physician Assistants and Nurse Practitioners) who all work together to provide you with the care you need, when you need it. We recommend signing up for the patient portal called MyChart.  Sign up information is provided on this After Visit Summary.  MyChart is used to connect with patients for Virtual Visits (Telemedicine).  Patients are able to view lab/test results, encounter notes, upcoming appointments, etc.  Non-urgent messages can be sent to your provider as well.   To learn more about what you can do with MyChart, go to [email or web address].

## 2024-04-20 NOTE — Progress Notes (Unsigned)
 Cardiology Office Note   Date:  04/22/2024  ID:  [name] [name], DOB 1945-11-15, MRN [id number] PCP: Norleen Lynwood ORN, MD  [facility] Providers Cardiologist:  Redell Shallow, MD     History of Present Illness [name] [name] is a 78 y.o. male with PMH of permanent atrial fibrillation on Eliquis , OSA on CPAP, chronic diastolic heart failure, hyperlipidemia, obesity hypoventilation syndrome on 3 L nasal cannula, type II DM, and possibly elevated right heart pressures by cath in June 2014.  He has failed DCCV in the past.  Myoview  in July 2009 showed EF 54%, normal perfusion.  Holter monitor in February 2012 showed atrial fibrillation with mildly decreased heart rate.  Right heart failure was felt to be secondary to pulmonary hypertension which was multifactorial include obstructive sleep apnea, OSA, and possibly pulmonary venous hypertension.  Combination of WHO group 2 and 3.  Right heart cath in June 2014 showed elevated right heart pressure.  He previously had renal insufficiency and hyperkalemia on combination of ACE inhibitor and spironolactone .  Previous echocardiogram in May 2017 showed EF 55-60%.  He had hyperkalemia on combination of ACE and spironolactone .  Echocardiogram in December 2022 showed normal EF, moderate biatrial enlargement.  He had a mechanical fall and admitted in April 2024 due to spine fracture requiring ORIF.  He developed AKI while on home diuretic.  Echocardiogram obtained during the hospital admission showed EF 60 to 65%, normal RV.  He was readmitted in June 2024 with UTI and sepsis.  He was in rapid A-fib, febrile and hypotensive.  Urine culture grew Klebsiella.  He was treated with antibiotic.  He also had AKI treated with IVF and discharged off of torsemide .  He was readmitted near the end of June 2024 with altered mental status, acute on chronic hypoxic and hypercapnic respiratory failure requiring BiPAP secondary to acute on chronic heart failure exacerbation and sepsis  secondary to UTI and AKI.  He has significant anasarca.  Albumin  was less than 1.5.  He responded well to IV diuresis.  He is not on SGLT2 inhibitor due to UTI.  Patient was last seen by Josefa Beauvais, NP on 06/20/2023, at which time he was on 3 L nasal cannula.  Since last office visit, he was admitted with complicated UTI in April 2025.  Hospital course complicated by hypotension with blood pressure 80/50.  He was treated with antibiotic.  Patient presents today accompanied by daughter.  His weight has been stable hovering between 250 to 254 pounds.  He does not have significant lower extremity edema.  His lung is clear.  Heart rate is very well-controlled on 25 mg twice a day of metoprolol .  He is on 25 mg daily of spironolactone  and 20 mg daily of torsemide .  I will continue on the current dose of diuretic.  He is aware that in about 2 years when he turns age 10, we will lower his Eliquis  down to 2.5 mg twice a day.  Overall, patient is doing well and can follow-up in 6 to 8 months.  I did recommend he repeat a basic metabolic panel in 4 to 5 months.  ROS:   He denies chest pain, palpitations, dyspnea, pnd, orthopnea, n, v, dizziness, syncope, edema, weight gain, or early satiety. All other systems reviewed and are otherwise negative except as noted above.    Studies Reviewed      Cardiac Studies & Procedures   ______________________________________________________________________________________________     ECHOCARDIOGRAM  ECHOCARDIOGRAM COMPLETE 11/29/2022  Narrative  ECHOCARDIOGRAM REPORT    Patient Name:   [name] [name]  Date of Exam: 11/29/2022 Medical Rec #:  [id number]  Height:       68.0 in Accession #:    [id number] Weight:       275.0 lb Date of Birth:  June 05, 1946   BSA:          2.340 m Patient Age:    76 years   BP:           119/75 mmHg Patient Gender: M          HR:           71 bpm. Exam Location:  Inpatient  Procedure: 2D Echo, Cardiac Doppler, Color Doppler and  Intracardiac Opacification Agent  Indications:    Abnormal ECG 794.31 / R94.31  History:        Patient has prior history of Echocardiogram examinations, most recent 07/22/2021. CHF, Pulmonary HTN, Arrythmias:Atrial Fibrillation; Risk Factors:Dyslipidemia, Diabetes and Former Smoker.  Sonographer:    Madeline Finder Referring Phys: [id number] [facility]   Sonographer Comments: Technically difficult study due to poor echo windows. Image acquisition challenging due to patient body habitus and Image acquisition challenging due to respiratory motion. IMPRESSIONS   1. Left ventricular ejection fraction, by estimation, is 60 to 65%. The left ventricle has normal function. The left ventricle has no regional wall motion abnormalities. There is mild concentric left ventricular hypertrophy. Left ventricular diastolic function could not be evaluated. 2. Right ventricular systolic function is normal. The right ventricular size is normal. 3. Left atrial size was moderately dilated. 4. Right atrial size was moderately dilated. 5. The mitral valve was not well visualized. No evidence of mitral valve regurgitation. No evidence of mitral stenosis. 6. The aortic valve is normal in structure. Aortic valve regurgitation is not visualized. No aortic stenosis is present. 7. The inferior vena cava is dilated in size with >50% respiratory variability, suggesting right atrial pressure of 8 mmHg. 8. Technically very limited echo due to poor sound wave transmission.  FINDINGS Left Ventricle: Left ventricular ejection fraction, by estimation, is 60 to 65%. The left ventricle has normal function. The left ventricle has no regional wall motion abnormalities. Definity  contrast agent was given IV to delineate the left ventricular endocardial borders. The left ventricular internal cavity size was normal in size. There is mild concentric left ventricular hypertrophy. Left ventricular diastolic function could not be  evaluated due to atrial fibrillation. Left ventricular diastolic function could not be evaluated.  Right Ventricle: The right ventricular size is normal. No increase in right ventricular wall thickness. Right ventricular systolic function is normal.  Left Atrium: Left atrial size was moderately dilated.  Right Atrium: Right atrial size was moderately dilated.  Pericardium: There is no evidence of pericardial effusion.  Mitral Valve: The mitral valve was not well visualized. No evidence of mitral valve regurgitation. No evidence of mitral valve stenosis.  Tricuspid Valve: The tricuspid valve is not well visualized. Tricuspid valve regurgitation is not demonstrated. No evidence of tricuspid stenosis.  Aortic Valve: The aortic valve is normal in structure. Aortic valve regurgitation is not visualized. No aortic stenosis is present.  Pulmonic Valve: The pulmonic valve was not well visualized. Pulmonic valve regurgitation is not visualized. No evidence of pulmonic stenosis.  Aorta: The aortic root is normal in size and structure.  Venous: The inferior vena cava is dilated in size with greater than 50% respiratory variability, suggesting right atrial pressure of 8 mmHg.  IAS/Shunts:  No atrial level shunt detected by color flow Doppler.   LEFT VENTRICLE PLAX 2D LVIDd:         4.60 cm   Diastology LVIDs:         3.10 cm   LV e' medial:    6.85 cm/s LV PW:         1.20 cm   LV E/e' medial:  14.6 LV IVS:        1.20 cm   LV e' lateral:   10.20 cm/s LVOT diam:     2.20 cm   LV E/e' lateral: 9.8 LV SV:         66 LV SV Index:   28 LVOT Area:     3.80 cm   RIGHT VENTRICLE RV S prime:     13.30 cm/s TAPSE (M-mode): 1.7 cm  LEFT ATRIUM              Index        RIGHT ATRIUM           Index LA diam:        4.30 cm  1.84 cm/m   RA Area:     35.90 cm LA Vol (A2C):   165.0 ml 70.51 ml/m  RA Volume:   154.00 ml 65.81 ml/m LA Vol (A4C):   162.0 ml 69.23 ml/m LA Biplane Vol: 165.0 ml  70.51 ml/m AORTIC VALVE LVOT Vmax:   89.20 cm/s LVOT Vmean:  62.550 cm/s LVOT VTI:    0.173 m  AORTA Ao Root diam: 3.70 cm Ao Asc diam:  3.60 cm  MITRAL VALVE MV Area (PHT): 3.68 cm    SHUNTS MV Decel Time: 206 msec    Systemic VTI:  0.17 m MV E velocity: 99.80 cm/s  Systemic Diam: 2.20 cm  Toribio Fuel MD Electronically signed by Toribio Fuel MD Signature Date/Time: 11/29/2022/4:30:59 PM    Final          ______________________________________________________________________________________________      Risk Assessment/Calculations  CHA2DS2-VASc Score = 5   This indicates a 7.2% annual risk of stroke. The patient's score is based upon: CHF History: 1 HTN History: 1 Diabetes History: 1 Stroke History: 0 Vascular Disease History: 0 Age Score: 2 Gender Score: 0           Physical Exam VS:  BP 110/74   Pulse 76   Ht 5' 8 (1.727 m)   Wt 254 lb 6.4 oz (115.4 kg)   SpO2 94%   BMI 38.68 kg/m        Wt Readings from Last 3 Encounters:  04/20/24 254 lb 6.4 oz (115.4 kg)  03/02/24 250 lb (113.4 kg)  02/24/24 253 lb (114.8 kg)    GEN: Well nourished, well developed in no acute distress NECK: No JVD; No carotid bruits CARDIAC: Irregularly irregular, no murmurs, rubs, gallops RESPIRATORY:  Clear to auscultation without rales, wheezing or rhonchi  ABDOMEN: Soft, non-tender, non-distended EXTREMITIES:  No edema; No deformity   ASSESSMENT AND PLAN  Permanent atrial fibrillation: Rate controlled.  On Eliquis  and metoprolol .  CKD stage III: Stable on last blood work.  Chronic diastolic heart failure: Euvolemic on exam.  Continue torsemide  and spironolactone   Chronic respiratory failure: On 3 L nasal cannula  DM 2: Managed by primary care provider.       Dispo: Follow-up in 6 to 8 months.  Repeat base metabolic panel in 4 to 5 months.  Signed, Scot Ford, PA

## 2024-04-27 ENCOUNTER — Telehealth: Payer: Self-pay

## 2024-04-27 NOTE — Telephone Encounter (Signed)
 Copied from CRM [id number]. Topic: Clinical - Home Health Verbal Orders >> Apr 27, 2024 12:37 PM Charolett L wrote: Caller/Agency: Deandra/[facility] Callback Number: [phone] Service Requested: requesting verbal order to change patients foley catheter that was last changed on 08/06

## 2024-04-30 ENCOUNTER — Telehealth: Payer: Self-pay | Admitting: Radiology

## 2024-04-30 NOTE — Telephone Encounter (Signed)
 Copied from CRM [id number]. Topic: Clinical - Home Health Verbal Orders >> Apr 27, 2024 12:37 PM Charolett L wrote: Caller/Agency: Deandra/[facility] Callback Number: [phone] Service Requested: requesting verbal order to change patients foley catheter that was last changed on 08/06 >> Apr 30, 2024  4:11 PM Ivette P wrote: Arlana called in to get an update on this matter, has not heard anything. Caleld CAL and Beverley stated to send crm, please follow up with Deandra

## 2024-05-04 ENCOUNTER — Telehealth: Payer: Self-pay | Admitting: Radiology

## 2024-05-04 NOTE — Telephone Encounter (Signed)
 Copied from CRM [id number]. Topic: Clinical - Home Health Verbal Orders >> May 04, 2024  2:09 PM Mia F wrote: Caller/Agency: Teon from [facility]  Callback Number: [phone] Service Requested: Skilled Nursing - Folley Catheter Change  Frequency: Every 4 weeks  Any new concerns about the patient? No

## 2024-05-07 NOTE — Telephone Encounter (Signed)
 Called and spoke with Deandra at [facility]. Gave verbal OK on changing of catheter once monthly for remainder of use.

## 2024-05-07 NOTE — Telephone Encounter (Signed)
 Yes ok for verbal for catheter change once monthly until no longer needed

## 2024-05-08 NOTE — Telephone Encounter (Signed)
 Ok for [employer]

## 2024-05-11 NOTE — Telephone Encounter (Signed)
 Called, gave OK for verbal order per Dr. Norleen. Spoke with Shona.  Caller/Agency: [place] from [facility]  Callback Number: [phone] Service Requested: Skilled Nursing - Folley Catheter Change  Frequency: Every 4 weeks  Any new concerns about the patient? No

## 2024-05-18 ENCOUNTER — Telehealth: Payer: Self-pay | Admitting: *Deleted

## 2024-05-18 NOTE — Progress Notes (Signed)
 Complex Care Management Care Guide Note  05/18/2024 Name: [name] [name] MRN: [id number] DOB: 1945-11-02  [name] [name] is a 78 y.o. year old male who is a primary care patient of Norleen Lynwood ORN, MD and is actively engaged with the care management team. I reached out to Royden JONETTA Ruth by phone today to assist with re-scheduling  with the RN Case Manager.  Follow up plan: Unsuccessful telephone outreach attempt made. A HIPAA compliant phone message was left for the patient providing contact information and requesting a return call.  Thedford Franks, CMA [place]  [facility], [facility] Guide Direct Dial: [phone]  Fax: [phone] Website: [email or web address]

## 2024-05-22 ENCOUNTER — Other Ambulatory Visit: Payer: Self-pay | Admitting: Urology

## 2024-05-22 ENCOUNTER — Encounter: Payer: Self-pay | Admitting: Urology

## 2024-05-22 MED ORDER — CEFDINIR 300 MG PO CAPS: 300.0000 mg | ORAL_CAPSULE | Freq: Two times a day (BID) | ORAL | 0 refills | Status: AC

## 2024-05-22 NOTE — Progress Notes (Signed)
 Complex Care Management Care Guide Note  05/22/2024 Name: [name] [name] MRN: [id number] DOB: 1946-06-01  [name] is a 78 y.o. year old male who is a primary care patient of Norleen Lynwood ORN, MD and is actively engaged with the care management team. I reached out to Royden JONETTA Ruth by phone today to assist with re-scheduling  with the RN Case Manager.  Follow up plan: Unsuccessful telephone outreach attempt made. A HIPAA compliant phone message was left for the patient providing contact information and requesting a return call.no further outreach attempts will be made due to inability to maintain patient contact.   Thedford Franks, CMA [place]  [facility], [facility] Guide Direct Dial: [phone]  Fax: [phone] Website: [email or web address]

## 2024-05-24 ENCOUNTER — Other Ambulatory Visit: Payer: Self-pay | Admitting: Internal Medicine

## 2024-05-24 MED ORDER — ALLOPURINOL 100 MG PO TABS: 100.0000 mg | ORAL_TABLET | Freq: Every day | ORAL | 3 refills | Status: AC

## 2024-05-24 NOTE — Telephone Encounter (Signed)
 Copied from CRM #[id number]. Topic: Clinical - Medication Refill >> May 24, 2024 11:18 AM Drema MATSU wrote: Medication: allopurinol  (ZYLOPRIM ) 100 MG tablet  Has the patient contacted their pharmacy? Yes (Agent: If no, request that the patient contact the pharmacy for the refill. If patient does not wish to contact the pharmacy document the reason why and proceed with request.) no more refills  (Agent: If yes, when and what did the pharmacy advise?)  This is the patient's preferred pharmacy:  [id number] - [place], [place] - [address] [place] [address] Phone: [phone] Fax: [phone]  Is this the correct pharmacy for this prescription? Yes If no, delete pharmacy and type the correct one.   Has the prescription been filled recently? Yes  Is the patient out of the medication? N/A  Has the patient been seen for an appointment in the last year OR does the patient have an upcoming appointment? Yes  Can we respond through MyChart? Yes  Agent: Please be advised that Rx refills may take up to 3 business days. We ask that you follow-up with your pharmacy.

## 2024-05-31 ENCOUNTER — Other Ambulatory Visit: Payer: Self-pay | Admitting: Cardiology

## 2024-06-02 ENCOUNTER — Observation Stay (HOSPITAL_BASED_OUTPATIENT_CLINIC_OR_DEPARTMENT_OTHER)
Admission: EM | Admit: 2024-06-02 | Discharge: 2024-06-04 | Disposition: A | Discharge status: Home / Self Care | Attending: Student in an Organized Health Care Education/Training Program | Admitting: Student in an Organized Health Care Education/Training Program

## 2024-06-02 ENCOUNTER — Encounter (HOSPITAL_BASED_OUTPATIENT_CLINIC_OR_DEPARTMENT_OTHER): Payer: Self-pay

## 2024-06-02 ENCOUNTER — Other Ambulatory Visit: Payer: Self-pay

## 2024-06-02 ENCOUNTER — Emergency Department (HOSPITAL_BASED_OUTPATIENT_CLINIC_OR_DEPARTMENT_OTHER)

## 2024-06-02 DIAGNOSIS — Z79899 Other long term (current) drug therapy: Secondary | ICD-10-CM | POA: Insufficient documentation

## 2024-06-02 DIAGNOSIS — R310 Gross hematuria: Principal | ICD-10-CM | POA: Diagnosis present

## 2024-06-02 DIAGNOSIS — Z7982 Long term (current) use of aspirin: Secondary | ICD-10-CM | POA: Insufficient documentation

## 2024-06-02 DIAGNOSIS — Z978 Presence of other specified devices: Secondary | ICD-10-CM

## 2024-06-02 DIAGNOSIS — N3001 Acute cystitis with hematuria: Secondary | ICD-10-CM

## 2024-06-02 DIAGNOSIS — E162 Hypoglycemia, unspecified: Secondary | ICD-10-CM | POA: Diagnosis not present

## 2024-06-02 DIAGNOSIS — R319 Hematuria, unspecified: Secondary | ICD-10-CM | POA: Diagnosis present

## 2024-06-02 DIAGNOSIS — K8020 Calculus of gallbladder without cholecystitis without obstruction: Secondary | ICD-10-CM | POA: Diagnosis not present

## 2024-06-02 DIAGNOSIS — J9611 Chronic respiratory failure with hypoxia: Secondary | ICD-10-CM | POA: Diagnosis present

## 2024-06-02 DIAGNOSIS — E119 Type 2 diabetes mellitus without complications: Secondary | ICD-10-CM

## 2024-06-02 DIAGNOSIS — Z794 Long term (current) use of insulin: Secondary | ICD-10-CM | POA: Insufficient documentation

## 2024-06-02 DIAGNOSIS — N189 Chronic kidney disease, unspecified: Secondary | ICD-10-CM

## 2024-06-02 DIAGNOSIS — I129 Hypertensive chronic kidney disease with stage 1 through stage 4 chronic kidney disease, or unspecified chronic kidney disease: Secondary | ICD-10-CM | POA: Diagnosis not present

## 2024-06-02 DIAGNOSIS — I10 Essential (primary) hypertension: Secondary | ICD-10-CM | POA: Diagnosis present

## 2024-06-02 DIAGNOSIS — M109 Gout, unspecified: Secondary | ICD-10-CM | POA: Insufficient documentation

## 2024-06-02 DIAGNOSIS — E875 Hyperkalemia: Secondary | ICD-10-CM | POA: Insufficient documentation

## 2024-06-02 DIAGNOSIS — E1122 Type 2 diabetes mellitus with diabetic chronic kidney disease: Secondary | ICD-10-CM | POA: Insufficient documentation

## 2024-06-02 DIAGNOSIS — G3184 Mild cognitive impairment, so stated: Secondary | ICD-10-CM | POA: Diagnosis not present

## 2024-06-02 DIAGNOSIS — L98421 Non-pressure chronic ulcer of back limited to breakdown of skin: Secondary | ICD-10-CM | POA: Diagnosis not present

## 2024-06-02 DIAGNOSIS — N1832 Chronic kidney disease, stage 3b: Secondary | ICD-10-CM | POA: Diagnosis not present

## 2024-06-02 DIAGNOSIS — N179 Acute kidney failure, unspecified: Secondary | ICD-10-CM | POA: Diagnosis present

## 2024-06-02 DIAGNOSIS — N3289 Other specified disorders of bladder: Secondary | ICD-10-CM | POA: Diagnosis not present

## 2024-06-02 DIAGNOSIS — I272 Pulmonary hypertension, unspecified: Secondary | ICD-10-CM | POA: Diagnosis present

## 2024-06-02 DIAGNOSIS — Z8739 Personal history of other diseases of the musculoskeletal system and connective tissue: Secondary | ICD-10-CM

## 2024-06-02 DIAGNOSIS — I480 Paroxysmal atrial fibrillation: Secondary | ICD-10-CM | POA: Diagnosis not present

## 2024-06-02 DIAGNOSIS — L89151 Pressure ulcer of sacral region, stage 1: Secondary | ICD-10-CM | POA: Diagnosis not present

## 2024-06-02 DIAGNOSIS — N3091 Cystitis, unspecified with hematuria: Principal | ICD-10-CM | POA: Insufficient documentation

## 2024-06-02 DIAGNOSIS — Z4682 Encounter for fitting and adjustment of non-vascular catheter: Secondary | ICD-10-CM | POA: Diagnosis not present

## 2024-06-02 DIAGNOSIS — R109 Unspecified abdominal pain: Secondary | ICD-10-CM | POA: Diagnosis not present

## 2024-06-02 DIAGNOSIS — N400 Enlarged prostate without lower urinary tract symptoms: Secondary | ICD-10-CM | POA: Diagnosis present

## 2024-06-02 DIAGNOSIS — R7989 Other specified abnormal findings of blood chemistry: Secondary | ICD-10-CM

## 2024-06-02 DIAGNOSIS — I700 Atherosclerosis of aorta: Secondary | ICD-10-CM | POA: Diagnosis not present

## 2024-06-02 DIAGNOSIS — G4733 Obstructive sleep apnea (adult) (pediatric): Secondary | ICD-10-CM | POA: Diagnosis present

## 2024-06-02 DIAGNOSIS — N3000 Acute cystitis without hematuria: Secondary | ICD-10-CM

## 2024-06-02 LAB — BASIC METABOLIC PANEL WITH GFR
Anion gap: 10 (ref 5–15)
BUN: 36 mg/dL — ABNORMAL HIGH (ref 8–23)
CO2: 24 mmol/L (ref 22–32)
Calcium: 9.7 mg/dL (ref 8.9–10.3)
Chloride: 105 mmol/L (ref 98–111)
Creatinine, Ser: 2.11 mg/dL — ABNORMAL HIGH (ref 0.61–1.24)
GFR, Estimated: 31 mL/min — ABNORMAL LOW (ref >60)
Glucose, Bld: 64 mg/dL — ABNORMAL LOW (ref 70–99)
Potassium: 5.7 mmol/L — ABNORMAL HIGH (ref 3.5–5.1)
Sodium: 139 mmol/L (ref 135–145)

## 2024-06-02 LAB — URINALYSIS, W/ REFLEX TO CULTURE (INFECTION SUSPECTED)
Bacteria, UA: NONE SEEN
Bilirubin Urine: NEGATIVE
Glucose, UA: NEGATIVE mg/dL
Ketones, ur: NEGATIVE mg/dL
Nitrite: NEGATIVE
Protein, ur: NEGATIVE mg/dL
RBC / HPF: >50 RBC/hpf (ref 0–5)
RBC / HPF: >50 RBC/hpf (ref 0–5)
Specific Gravity, Urine: 1.006 (ref 1.005–1.030)
pH: 5.5 (ref 5.0–8.0)

## 2024-06-02 LAB — CBC WITH DIFFERENTIAL/PLATELET
Abs Immature Granulocytes: 0.03 K/uL (ref 0.00–0.07)
Basophils Absolute: 0.1 K/uL (ref 0.0–0.1)
Basophils Relative: 1 %
Eosinophils Absolute: 0.4 K/uL (ref 0.0–0.5)
Eosinophils Relative: 3 %
HCT: 42.2 % (ref 39.0–52.0)
Hemoglobin: 13.4 g/dL (ref 13.0–17.0)
Immature Granulocytes: 0 %
Lymphocytes Relative: 24 %
Lymphs Abs: 3.1 K/uL (ref 0.7–4.0)
MCH: 29.4 pg (ref 26.0–34.0)
MCHC: 31.8 g/dL (ref 30.0–36.0)
MCV: 92.5 fL (ref 80.0–100.0)
Monocytes Absolute: 1 K/uL (ref 0.1–1.0)
Monocytes Relative: 8 %
Neutro Abs: 8.6 K/uL — ABNORMAL HIGH (ref 1.7–7.7)
Neutrophils Relative %: 64 %
Platelets: 342 K/uL (ref 150–400)
RBC: 4.56 MIL/uL (ref 4.22–5.81)
RDW: 15 % (ref 11.5–15.5)
WBC: 13.1 K/uL — ABNORMAL HIGH (ref 4.0–10.5)
nRBC: 0 % (ref 0.0–0.2)

## 2024-06-02 LAB — CBG MONITORING, ED
Glucose-Capillary: 51 mg/dL — ABNORMAL LOW (ref 70–99)
Glucose-Capillary: 71 mg/dL (ref 70–99)

## 2024-06-02 LAB — LACTIC ACID, PLASMA: Lactic Acid, Venous: 1.5 mmol/L (ref 0.5–1.9)

## 2024-06-02 MED ORDER — METOPROLOL TARTRATE 25 MG PO TABS
25.0000 mg | ORAL_TABLET | Freq: Two times a day (BID) | ORAL | Status: DC
  Administered 2024-06-02 – 2024-06-04 (×4): 25 mg via ORAL
  Filled 2024-06-02 (×4): qty 1

## 2024-06-02 MED ORDER — SODIUM CHLORIDE 0.9% FLUSH
3.0000 mL | INTRAVENOUS | Status: DC | PRN
  Administered 2024-06-02: 3 mL via INTRAVENOUS

## 2024-06-02 MED ORDER — SODIUM CHLORIDE 0.9% FLUSH
3.0000 mL | Freq: Two times a day (BID) | INTRAVENOUS | Status: DC
Start: 2024-06-02 — End: 2024-06-04
  Administered 2024-06-02 – 2024-06-04 (×4): 3 mL via INTRAVENOUS

## 2024-06-02 MED ORDER — SODIUM CHLORIDE 0.9% FLUSH
3.0000 mL | Freq: Two times a day (BID) | INTRAVENOUS | Status: DC
  Administered 2024-06-03 (×2): 3 mL via INTRAVENOUS

## 2024-06-02 MED ORDER — SODIUM CHLORIDE 0.9 % IV SOLN
1.0000 g | INTRAVENOUS | Status: DC
  Administered 2024-06-03: 1 g via INTRAVENOUS
  Filled 2024-06-02: qty 10

## 2024-06-02 MED ORDER — DONEPEZIL HCL 5 MG PO TABS
5.0000 mg | ORAL_TABLET | Freq: Every day | ORAL | Status: DC
  Administered 2024-06-02 – 2024-06-03 (×2): 5 mg via ORAL
  Filled 2024-06-02 (×2): qty 1

## 2024-06-02 MED ORDER — SODIUM ZIRCONIUM CYCLOSILICATE 10 G PO PACK
10.0000 g | PACK | Freq: Once | ORAL | Status: AC
  Administered 2024-06-02: 10 g via ORAL
  Filled 2024-06-02: qty 1

## 2024-06-02 MED ORDER — SODIUM CHLORIDE 0.9 % IV SOLN: 250.0000 mL | INTRAVENOUS | Status: AC | PRN

## 2024-06-02 MED ORDER — ATORVASTATIN CALCIUM 20 MG PO TABS
20.0000 mg | ORAL_TABLET | Freq: Every day | ORAL | Status: DC
  Administered 2024-06-03 – 2024-06-04 (×2): 20 mg via ORAL
  Filled 2024-06-02 (×2): qty 1

## 2024-06-02 MED ORDER — INSULIN ASPART 100 UNIT/ML IJ SOLN: 0.0000 [IU] | Freq: Three times a day (TID) | INTRAMUSCULAR | Status: DC | PRN

## 2024-06-02 MED ORDER — ACETAMINOPHEN 325 MG PO TABS: 650.0000 mg | ORAL_TABLET | Freq: Four times a day (QID) | ORAL | Status: DC | PRN

## 2024-06-02 MED ORDER — ONDANSETRON HCL 4 MG/2ML IJ SOLN: 4.0000 mg | Freq: Four times a day (QID) | INTRAMUSCULAR | Status: DC | PRN

## 2024-06-02 MED ORDER — ONDANSETRON HCL 4 MG PO TABS: 4.0000 mg | ORAL_TABLET | Freq: Four times a day (QID) | ORAL | Status: DC | PRN

## 2024-06-02 MED ORDER — SODIUM CHLORIDE 0.9 % IV SOLN
1.0000 g | Freq: Once | INTRAVENOUS | Status: AC
  Administered 2024-06-02: 1 g via INTRAVENOUS
  Filled 2024-06-02: qty 10

## 2024-06-02 MED ORDER — ACETAMINOPHEN 650 MG RE SUPP: 650.0000 mg | Freq: Four times a day (QID) | RECTAL | Status: DC | PRN

## 2024-06-02 MED ORDER — LACTATED RINGERS IV SOLN
INTRAVENOUS | Status: AC
Start: 2024-06-02 — End: 2024-06-03

## 2024-06-02 MED ORDER — POLYETHYLENE GLYCOL 3350 17 G PO PACK
17.0000 g | PACK | Freq: Every day | ORAL | Status: DC | PRN
Start: 2024-06-02 — End: 2024-06-04

## 2024-06-02 NOTE — Plan of Care (Signed)
 Plan of Care Note for accepted transfer   Patient name: [name] [name] FMW:[id number] DOB: 09-17-1945  Facility requesting transfer: [facility] Requesting Provider: Dr. Lenor Facility course: 78 year old male with history of A-fib on Eliquis , HFpEF, pulmonary hypertension, CKD stage IIIb, type 2 diabetes, hypertension, hyperlipidemia, OSA/OHS, gout, BPH with chronic indwelling Foley catheter presenting with complaints of gross hematuria, Foley catheter not draining well, and lower abdominal pain.  Recently treated for UTI with cefdinir .  Afebrile.  Labs showing WBC count 13.1, potassium 5.7, glucose 64, BUN 36, creatinine 2.1 (previously 1.8 on 03/02/2024), lactic acid normal.  UA with negative nitrite, trace leukocytes, >50 RBCs, 11-20 WBCs, and no bacteria.  Urine culture in process.  CT abdomen pelvis showing mild perivesical stranding suspicious for cystitis.  His Foley catheter was exchanged and irrigated in the ED after which urine initially became clear but then he started having gross hematuria again so EDP spoke to on-call urologist Dr. Lovie who recommended admission to [facility] for observation as patient may need cystoscopy if hematuria does not resolve.  Patient was given a dose of ceftriaxone .  Glucose will be rechecked after p.o. intake.  He will be given a dose of Lokelma for hyperkalemia.  Plan of care: The patient is accepted for admission to Progressive unit at [facility].  [facility] will assume care on arrival to accepting facility. Until arrival, care as per EDP. However, [facility] available 24/7 for questions and assistance.  Check [email or web address] for on-call coverage.  Nursing staff, please call [facility] Admits & Consults System-Wide number under Amion on patient's arrival so appropriate admitting provider can evaluate the pt.

## 2024-06-02 NOTE — ED Provider Notes (Signed)
 [place] EMERGENCY DEPARTMENT AT [facility] Provider Note   CSN: [id number] Arrival date & time: 06/02/24  1535     Patient presents with: Hematuria and Groin Pain   [name] [name] is a 78 y.o. male.   Patient is a 78 year old male with a history of indwelling Foley catheter who presents with gross hematuria.  He also has a history of A-fib and is on Eliquis .  He last took his Eliquis  this morning.  His wife states that he had a little bit of blood in his urine yesterday but it became grossly bloody today.  He says it is not draining as well.  He has some pain in his lower abdomen and penis area.  He has not had any known fevers.  No nausea or vomiting.  He did have some chills on October 15 and they contacted his urologist who started him on cefdinir  which he finished this past Tuesday.  He has been admitted in the past for urosepsis.       Prior to Admission medications   Medication Sig Start Date End Date Taking? Authorizing Provider  acetaminophen  (TYLENOL ) 500 MG tablet Take 500-1,000 mg by mouth every 6 (six) hours as needed for moderate pain (pain score 4-6).    [provider]  allopurinol  (ZYLOPRIM ) 100 MG tablet Take 1 tablet (100 mg total) by mouth daily. 05/24/24   Norleen Lynwood ORN, MD  ammonium lactate (LAC-HYDRIN) 12 % lotion Apply 1 Application topically as needed for dry skin. 02/20/23   [provider]  atorvastatin  (LIPITOR) 20 MG tablet Take 1 tablet (20 mg total) by mouth daily. 06/14/23   Norleen Lynwood ORN, MD  cholecalciferol  (VITAMIN D3) 25 MCG (1000 UNIT) tablet Take 2,000 Units by mouth daily.    [provider]  cyanocobalamin  (VITAMIN B12) 1000 MCG/ML injection Inject 1 mL (1,000 mcg total) into the muscle every 30 (thirty) days. 07/01/23   Norleen Lynwood ORN, MD  donepezil  (ARICEPT ) 5 MG tablet Take 1 tablet (5 mg total) by mouth at bedtime. 11/29/23   Norleen Lynwood ORN, MD  ELIQUIS  5 MG TABS tablet TAKE 1 TABLET TWICE A DAY 02/06/24   Pietro Redell RAMAN, MD  glipiZIDE  (GLUCOTROL  XL) 10 MG 24 hr tablet TAKE 1 TABLET(10 MG) BY MOUTH DAILY WITH BREAKFAST 08/17/23   Norleen Lynwood ORN, MD  metoprolol  tartrate (LOPRESSOR ) 25 MG tablet TAKE 1 TABLET(25 MG) BY MOUTH TWICE DAILY 08/17/23   Norleen Lynwood ORN, MD  nystatin  (MYCOSTATIN /NYSTOP ) powder Apply 1 Application topically 2 (two) times daily. 09/09/23   Stoneking, Adine PARAS., MD  nystatin  cream (MYCOSTATIN ) Apply 1 Application topically 2 (two) times daily. 09/09/23   Stoneking, Adine PARAS., MD  OXYGEN  Inhale 3-5 L into the lungs daily. 3L Daily  4L Resting  5L Exertion    [provider]  polyethylene glycol (MIRALAX  / GLYCOLAX ) 17 g packet Take 17 g by mouth daily as needed. 02/10/23   Fairy Frames, MD  spironolactone  (ALDACTONE ) 25 MG tablet Take 1 tablet (25 mg total) by mouth daily. 05/13/23   Pietro Redell RAMAN, MD  torsemide  (DEMADEX ) 20 MG tablet Take 1 tablet (20 mg total) by mouth daily. 12/07/23   Pietro Redell RAMAN, MD    Allergies: Codeine and Heparin     Review of Systems  Constitutional:  Positive for fatigue. Negative for chills, diaphoresis and fever.  HENT:  Negative for congestion, rhinorrhea and sneezing.   Eyes: Negative.   Respiratory:  Negative for cough, chest tightness  and shortness of breath.   Cardiovascular:  Negative for chest pain and leg swelling.  Gastrointestinal:  Positive for abdominal pain. Negative for diarrhea, nausea and vomiting.  Genitourinary:  Positive for decreased urine volume, hematuria and penile pain. Negative for difficulty urinating, flank pain and frequency.  Musculoskeletal:  Negative for arthralgias and back pain.  Skin:  Negative for rash.  Neurological:  Negative for dizziness, speech difficulty, weakness, numbness and headaches.    Updated Vital Signs BP (!) 141/77   Pulse 86   Temp 98.6 F (37 C) (Oral)   Resp (!) 24   Ht 5' 8 (1.727 m)   Wt 115.4 kg   SpO2 96%   BMI 38.68 kg/m   Physical Exam Constitutional:       Appearance: He is well-developed.  HENT:     Head: Normocephalic and atraumatic.  Eyes:     Pupils: Pupils are equal, round, and reactive to light.  Cardiovascular:     Rate and Rhythm: Normal rate and regular rhythm.     Heart sounds: Normal heart sounds.  Pulmonary:     Effort: Pulmonary effort is normal. No respiratory distress.     Breath sounds: Normal breath sounds. No wheezing or rales.  Chest:     Chest wall: No tenderness.  Abdominal:     General: Bowel sounds are normal.     Palpations: Abdomen is soft.     Tenderness: There is no abdominal tenderness. There is no guarding or rebound.  Genitourinary:    Comments: Foley catheter in place, there is dark bloody urine in the tube.  There is also blood noted in his diaper and around the urethra leaking around the tube. Musculoskeletal:        General: Normal range of motion.     Cervical back: Normal range of motion and neck supple.  Lymphadenopathy:     Cervical: No cervical adenopathy.  Skin:    General: Skin is warm and dry.     Findings: No rash.  Neurological:     Mental Status: He is alert and oriented to person, place, and time.     (all labs ordered are listed, but only abnormal results are displayed) Labs Reviewed  BASIC METABOLIC PANEL WITH GFR - Abnormal; Notable for the following components:      Result Value   Potassium 5.7 (*)    Glucose, Bld 64 (*)    BUN 36 (*)    Creatinine, Ser 2.11 (*)    GFR, Estimated 31 (*)    All other components within normal limits  CBC WITH DIFFERENTIAL/PLATELET - Abnormal; Notable for the following components:   WBC 13.1 (*)    Neutro Abs 8.6 (*)    All other components within normal limits  URINALYSIS, W/ REFLEX TO CULTURE (INFECTION SUSPECTED) - Abnormal; Notable for the following components:   Color, Urine RED (*)    APPearance TURBID (*)    Glucose, UA   (*)    Value: TEST NOT REPORTED DUE TO COLOR INTERFERENCE OF URINE PIGMENT   Hgb urine dipstick   (*)     Value: TEST NOT REPORTED DUE TO COLOR INTERFERENCE OF URINE PIGMENT   Bilirubin Urine   (*)    Value: TEST NOT REPORTED DUE TO COLOR INTERFERENCE OF URINE PIGMENT   Ketones, ur   (*)    Value: TEST NOT REPORTED DUE TO COLOR INTERFERENCE OF URINE PIGMENT   Protein, ur   (*)    Value: TEST  NOT REPORTED DUE TO COLOR INTERFERENCE OF URINE PIGMENT   Nitrite   (*)    Value: TEST NOT REPORTED DUE TO COLOR INTERFERENCE OF URINE PIGMENT   Leukocytes,Ua   (*)    Value: TEST NOT REPORTED DUE TO COLOR INTERFERENCE OF URINE PIGMENT   Bacteria, UA RARE (*)    All other components within normal limits  URINALYSIS, W/ REFLEX TO CULTURE (INFECTION SUSPECTED) - Abnormal; Notable for the following components:   Color, Urine RED (*)    APPearance HAZY (*)    Hgb urine dipstick LARGE (*)    Leukocytes,Ua TRACE (*)    All other components within normal limits  CBG MONITORING, ED - Abnormal; Notable for the following components:   Glucose-Capillary 51 (*)    All other components within normal limits  URINE CULTURE  LACTIC ACID, PLASMA  COMPREHENSIVE METABOLIC PANEL WITH GFR  CBC  CREATININE, URINE, RANDOM  SODIUM, URINE, RANDOM  BASIC METABOLIC PANEL WITH GFR  CBG MONITORING, ED  TYPE AND SCREEN    EKG: None  Radiology: CT ABDOMEN PELVIS WO CONTRAST Result Date: 06/02/2024 EXAM: CT ABDOMEN AND PELVIS WITHOUT CONTRAST 06/02/2024 06:52:18 PM TECHNIQUE: CT of the abdomen and pelvis was performed without the administration of intravenous contrast. Multiplanar reformatted images are provided for review. Automated exposure control, iterative reconstruction, and/or weight-based adjustment of the mA/kV was utilized to reduce the radiation dose to as low as reasonably achievable. COMPARISON: 11/22/2023 CLINICAL HISTORY: Abdominal pain, acute, nonlocalized; gross hematuria. FINDINGS: LOWER CHEST: No acute abnormality. LIVER: Nodular hepatic contour. Subcentimeter inferior right hepatic cyst (image 33), benign.  GALLBLADDER AND BILE DUCTS: Suspected layering tiny gallstones (image 32). No biliary ductal dilatation. SPLEEN: No acute abnormality. PANCREAS: No acute abnormality. ADRENAL GLANDS: 10 mm fat density lesion in the left adrenal gland (image 23), compatible with a benign adrenal myelolipoma. Additional low density nodularity in the bilateral adrenal glands, unchanged, compatible with benign adrenal adenomas. KIDNEYS, URETERS AND BLADDER: Simple left renal cyst measuring up to 2.6 cm (image 27), benign. No follow-up is recommended. No stones in the kidneys or ureters. No hydronephrosis. No perinephric or periureteral stranding. Urinary bladder is decompressed by an indwelling Foley catheter. Mild perivesical stranding raising concern for cystitis, although similar to the prior/chronic. GI AND BOWEL: Stomach demonstrates no acute abnormality. There is no bowel obstruction. Prior appendectomy. PERITONEUM AND RETROPERITONEUM: No ascites. No free air. VASCULATURE: Atherosclerotic calcifications of the abdominal aorta and branch vessels, although patent. LYMPH NODES: No lymphadenopathy. REPRODUCTIVE ORGANS: Mild prostatomegaly. BONES AND SOFT TISSUES: No acute osseous abnormality. No focal soft tissue abnormality. IMPRESSION: 1. Mild perivesical stranding, raising concern for cystitis. Bladder decompressed by an indwelling Foley catheter. 2. Suspected layering tiny gallstones, without associated inflammatory changes. 3. Additional stable ancillary findings, as above. Electronically signed by: Pinkie Pebbles MD 06/02/2024 07:08 PM EDT RP Workstation: [id number]     Procedures   Medications Ordered in the ED  cefTRIAXone  (ROCEPHIN ) 1 g in sodium chloride  0.9 % 100 mL IVPB (has no administration in time range)  lactated ringers  infusion ( Intravenous New Bag/Given 06/02/24 2255)  atorvastatin  (LIPITOR) tablet 20 mg (has no administration in time range)  metoprolol  tartrate (LOPRESSOR ) tablet 25 mg (25 mg Oral  Given 06/02/24 2245)  donepezil  (ARICEPT ) tablet 5 mg (5 mg Oral Given 06/02/24 2245)  polyethylene glycol (MIRALAX  / GLYCOLAX ) packet 17 g (has no administration in time range)  sodium chloride  flush (NS) 0.9 % injection 3 mL (0 mLs Intravenous Duplicate 06/02/24 2256)  sodium chloride  flush (NS) 0.9 % injection 3 mL (3 mLs Intravenous Given 06/02/24 2256)  sodium chloride  flush (NS) 0.9 % injection 3 mL (3 mLs Intravenous Given 06/02/24 2256)  0.9 %  sodium chloride  infusion (has no administration in time range)  acetaminophen  (TYLENOL ) tablet 650 mg (has no administration in time range)    Or  acetaminophen  (TYLENOL ) suppository 650 mg (has no administration in time range)  ondansetron  (ZOFRAN ) tablet 4 mg (has no administration in time range)    Or  ondansetron  (ZOFRAN ) injection 4 mg (has no administration in time range)  insulin  aspart (novoLOG ) injection 0-6 Units (has no administration in time range)  cefTRIAXone  (ROCEPHIN ) 1 g in sodium chloride  0.9 % 100 mL IVPB (0 g Intravenous Stopped 06/02/24 2049)  sodium zirconium cyclosilicate (LOKELMA) packet 10 g (10 g Oral Given 06/02/24 2004)                                    Medical Decision Making Amount and/or Complexity of Data Reviewed Labs: ordered. Radiology: ordered.  Risk Prescription drug management. Decision regarding hospitalization.   This patient presents to the ED for concern of gross hematuria, this involves an extensive number of treatment options, and is a complaint that carries with it a high risk of complications and morbidity.  I considered the following differential and admission for this acute, potentially life threatening condition.  The differential diagnosis includes UTI, bladder polyp, kidney stone, bladder mass  MDM:    Patient is a 78 year old who has an indwelling Foley catheter and presents with gross hematuria.  He had enlarged bladder with retained urine on bladder scan here in the ED.  His  catheter was removed and a new 1 was placed.  It was irrigated with a large amount of saline.  Initially the urine cleared however shortly after it started to become grossly bloody again and was dark red-colored.  His urine does have some concerns for infection although mild.  He was given a dose of Rocephin .  His labs show an elevation in his creatinine and his potassium is a little bit elevated.  He was given a dose of Lokelma.  He was also noted to be hypoglycemic.  However he is alert awake and oriented.  He was given oral intake.  His glucose was rechecked and it was 71.  I discussed his ongoing hematuria with Dr. Lovie with urology.  He recommends overnight admission for observation.  He will consult on the patient tomorrow.  He request a CT scan.  CT scan was performed which did not show any acute concerning abnormality.  Discussed with Dr. Alfornia who has accepted the patient for admission.  (Labs, imaging, consults)  Labs: I Ordered, and personally interpreted labs.  The pertinent results include: Elevated creatinine, hypoglycemia, mild suggestions of UTI  Imaging Studies ordered: I ordered imaging studies including CT abdomen pelvis I independently visualized and interpreted imaging. I agree with the radiologist interpretation  Additional history obtained from wife at bedside.  External records from outside source obtained and reviewed including history  Cardiac Monitoring: The patient was maintained on a cardiac monitor.  If on the cardiac monitor, I personally viewed and interpreted the cardiac monitored which showed an underlying rhythm of: Sinus rhythm  Reevaluation: After the interventions noted above, I reevaluated the patient and found that they have :improved  Social Determinants of Health:    Disposition: Admit  to hospital  Co morbidities that complicate the patient evaluation  Past Medical History:  Diagnosis Date   Acute right-sided CHF (congestive heart failure)  (HCC)    a. 01/2013.   Arthritis    knees and hands; thoracic area of the spine (01/05/2013)   BRONCHITIS, CHRONIC 01/02/2009   Chronic kidney disease    COMMON MIGRAINE    none since treating for high BP   Complication of anesthesia    aspiration pneumonia after hand OR (01/05/2013)   DIABETES MELLITUS, TYPE II    GOUT    HYPERLIPIDEMIA    HYPERTENSION    Long term (current) use of anticoagulants    Morbid obesity (HCC)    OBESITY HYPOVENTILATION SYNDROME    a. on home O2.   On home oxygen  therapy    OSA (obstructive sleep apnea)    Oxygen  deficiency    Permanent atrial fibrillation (HCC)    PROSTATE SPECIFIC ANTIGEN, ELEVATED 06/09/2007   Pulmonary HTN (HCC)    a. multifactorial including obstructive sleep apnea, obesity hypoventilation syndrome and possible pulmonary venous hypertension.   SKIN RASH 03/12/2010   Sleep apnea    Unspecified hearing loss      Medicines Meds ordered this encounter  Medications   cefTRIAXone  (ROCEPHIN ) 1 g in sodium chloride  0.9 % 100 mL IVPB    Antibiotic Indication::   UTI   sodium zirconium cyclosilicate (LOKELMA) packet 10 g   cefTRIAXone  (ROCEPHIN ) 1 g in sodium chloride  0.9 % 100 mL IVPB    Antibiotic Indication::   UTI   lactated ringers  infusion   atorvastatin  (LIPITOR) tablet 20 mg   metoprolol  tartrate (LOPRESSOR ) tablet 25 mg   donepezil  (ARICEPT ) tablet 5 mg   polyethylene glycol (MIRALAX  / GLYCOLAX ) packet 17 g   sodium chloride  flush (NS) 0.9 % injection 3 mL   sodium chloride  flush (NS) 0.9 % injection 3 mL   sodium chloride  flush (NS) 0.9 % injection 3 mL   0.9 %  sodium chloride  infusion   OR Linked Order Group    acetaminophen  (TYLENOL ) tablet 650 mg    acetaminophen  (TYLENOL ) suppository 650 mg   OR Linked Order Group    ondansetron  (ZOFRAN ) tablet 4 mg    ondansetron  (ZOFRAN ) injection 4 mg   insulin  aspart (novoLOG ) injection 0-6 Units    Correction coverage::   Very Sensitive (ESRD/Dialysis)    CBG < 70::    Implement Hypoglycemia Standing Orders and refer to Hypoglycemia Standing Orders sidebar report    CBG 70 - 120::   0 units    CBG 121 - 150::   0 units    CBG 151 - 200::   1 unit    CBG 201-250::   2 units    CBG 251-300::   3 units    CBG 301-350::   4 units    CBG 351-400::   5 units    CBG > 400:   Give 6 units and call MD    I have reviewed the patients home medicines and have made adjustments as needed  Problem List / ED Course: Problem List Items Addressed This Visit   None            Final diagnoses:  None    ED Discharge Orders     None          Lenor Hollering, MD 06/02/24 2319

## 2024-06-02 NOTE — ED Triage Notes (Signed)
 Patient arrives POV with foley catheter in place with complaints of developing blood in urine overnight. Also reports groin pain. Patient had his catheter placed 10 days ago and was recently treated for a UTI as well. He is on eliquis .

## 2024-06-02 NOTE — H&P (Signed)
 History and Physical    [name] [name] FMW:[id number] DOB: 1946-01-29 DOA: 06/02/2024  PCP: Norleen Lynwood ORN, MD   Patient coming from: Home   Chief Complaint:  Chief Complaint  Patient presents with   Hematuria   Groin Pain   ED TRIAGE note:  HPI:  [name] [name] is a 78 y.o. male with medical history significant of A-fib on Eliquis , HFpEF, pulmonary hypertension, CKD stage IIIb, type 2 diabetes, hypertension, chronic hypoxic respiratory failure 2 to 3 L oxygen  at bedtime, OSA on BiPAP at bedtime, hyperlipidemia, OSA/OHS, gout and BPH with chronic indwelling Foley catheter presenting with complaints of gross hematuria, Foley catheter not draining well, and lower abdominal pain.  Recently treated for UTI with cefdinir .    Patient denies any flank pain, fever, chill, nausea and vomiting.  Denies any chest pain and palpitation.  Patient reported never experiencing hematuria like this before.   At presentation to ED patient is hemodynamically stable.  Afebrile.   Labs showing WBC count 13.1 BMP potassium 5.7, glucose 64, BUN 36, creatinine 2.1 (previously 1.8 on 03/02/2024), lactic acid normal.   UA with negative nitrite, trace leukocytes, >50 RBCs, 11-20 WBCs, and no bacteria.  Urine culture in process.    CT abdomen pelvis showing mild perivesical stranding suspicious for cystitis.    His Foley catheter was exchanged and irrigated in the ED after which urine initially became clear but then he started having gross hematuria again so EDP spoke to on-call urologist Dr. Lovie who recommended admission to [facility] for observation as patient may need cystoscopy if hematuria does not resolve.   Patient was given a dose of ceftriaxone .  Glucose will be rechecked after p.o. intake.  He will be given a dose of Lokelma for hyperkalemia.    Hospitalist consulted for further evaluation management of acute cystitis, hematuria, hyperkalemia and acute kidney injury on CKD stage IIIb.   Significant  labs in the ED: Lab Orders         Urine Culture         Basic metabolic panel         CBC with Differential         Urinalysis, w/ Reflex to Culture (Infection Suspected) -Urine, Catheterized; Indwelling urinary catheter         Urinalysis, w/ Reflex to Culture (Infection Suspected) -Urine, Clean Catch         Comprehensive metabolic panel         CBC         Creatinine, urine, random         Sodium, urine, random         Basic metabolic panel         CBG monitoring, ED         CBG monitoring, ED       Review of Systems:  Review of Systems  Constitutional:  Negative for chills, fever and weight loss.  Respiratory:  Negative for cough, sputum production and shortness of breath.   Cardiovascular:  Negative for chest pain and palpitations.  Gastrointestinal:  Negative for abdominal pain, diarrhea, heartburn, nausea and vomiting.  Genitourinary:  Positive for hematuria. Negative for dysuria, flank pain, frequency and urgency.       Lower abdominal/suprapubic discomfort  Musculoskeletal:  Negative for myalgias.  Neurological:  Negative for dizziness and headaches.  Psychiatric/Behavioral:  The patient is not nervous/anxious.     Past Medical History:  Diagnosis Date   Acute right-sided CHF (congestive  heart failure) (HCC)    a. 01/2013.   Arthritis    knees and hands; thoracic area of the spine (01/05/2013)   BRONCHITIS, CHRONIC 01/02/2009   Chronic kidney disease    COMMON MIGRAINE    none since treating for high BP   Complication of anesthesia    aspiration pneumonia after hand OR (01/05/2013)   DIABETES MELLITUS, TYPE II    GOUT    HYPERLIPIDEMIA    HYPERTENSION    Long term (current) use of anticoagulants    Morbid obesity (HCC)    OBESITY HYPOVENTILATION SYNDROME    a. on home O2.   On home oxygen  therapy    OSA (obstructive sleep apnea)    Oxygen  deficiency    Permanent atrial fibrillation (HCC)    PROSTATE SPECIFIC ANTIGEN, ELEVATED 06/09/2007   Pulmonary  HTN (HCC)    a. multifactorial including obstructive sleep apnea, obesity hypoventilation syndrome and possible pulmonary venous hypertension.   SKIN RASH 03/12/2010   Sleep apnea    Unspecified hearing loss     Past Surgical History:  Procedure Laterality Date   APPENDECTOMY  08/09/1972   APPLICATION OF ROBOTIC ASSISTANCE FOR SPINAL PROCEDURE N/A 11/23/2022   Procedure: APPLICATION OF ROBOTIC ASSISTANCE FOR SPINAL PROCEDURE;  Surgeon: Lanis Pupa, MD;  Location: [facility] OR;  Service: Neurosurgery;  Laterality: N/A;   CARDIOVERSION  05/21/2008; ~ 06/2008   FRACTURE SURGERY  2024   HERNIA REPAIR     INGUINAL HERNIA REPAIR Right    RIGHT HEART CATHETERIZATION N/A 01/08/2013   Procedure: RIGHT HEART CATH;  Surgeon: Aleene JINNY Passe, MD;  Location: [facility] CATH LAB;  Service: Cardiovascular;  Laterality: N/A;   SPINE SURGERY     TENDON REPAIR Right 08/10/2007   lacerated his tendon and pulley Dr. Lorretta   UMBILICAL HERNIA REPAIR       reports that he quit smoking about 45 years ago. His smoking use included cigarettes. He started smoking about 60 years ago. He has a 15 pack-year smoking history. He has never used smokeless tobacco. He reports current alcohol use. He reports that he does not use drugs.  Allergies  Allergen Reactions   Codeine     Altered mental status goofy   Heparin  Rash     Abdominal rash 01/2013    Family History  Problem Relation Age of Onset   Alzheimer's disease Father    Prostate cancer Father    Stroke Father    Hearing loss Brother    Cancer Other        Breast Cancer, <50 yo 1st degree relative   Coronary artery disease Other        Male, 1st degree relative   Obesity Brother     Prior to Admission medications   Medication Sig Start Date End Date Taking? Authorizing Provider  acetaminophen  (TYLENOL ) 500 MG tablet Take 500-1,000 mg by mouth every 6 (six) hours as needed for moderate pain (pain score 4-6).    [provider]  allopurinol   (ZYLOPRIM ) 100 MG tablet Take 1 tablet (100 mg total) by mouth daily. 05/24/24   Norleen Lynwood ORN, MD  ammonium lactate (LAC-HYDRIN) 12 % lotion Apply 1 Application topically as needed for dry skin. 02/20/23   [provider]  atorvastatin  (LIPITOR) 20 MG tablet Take 1 tablet (20 mg total) by mouth daily. 06/14/23   Norleen Lynwood ORN, MD  cholecalciferol  (VITAMIN D3) 25 MCG (1000 UNIT) tablet Take 2,000 Units by mouth daily.    [provider]  cyanocobalamin  (VITAMIN B12) 1000 MCG/ML injection Inject 1 mL (1,000 mcg total) into the muscle every 30 (thirty) days. 07/01/23   Norleen Lynwood ORN, MD  donepezil  (ARICEPT ) 5 MG tablet Take 1 tablet (5 mg total) by mouth at bedtime. 11/29/23   Norleen Lynwood ORN, MD  ELIQUIS  5 MG TABS tablet TAKE 1 TABLET TWICE A DAY 02/06/24   Pietro Redell RAMAN, MD  glipiZIDE  (GLUCOTROL  XL) 10 MG 24 hr tablet TAKE 1 TABLET(10 MG) BY MOUTH DAILY WITH BREAKFAST 08/17/23   Norleen Lynwood ORN, MD  metoprolol  tartrate (LOPRESSOR ) 25 MG tablet TAKE 1 TABLET(25 MG) BY MOUTH TWICE DAILY 08/17/23   Norleen Lynwood ORN, MD  nystatin  (MYCOSTATIN /NYSTOP ) powder Apply 1 Application topically 2 (two) times daily. 09/09/23   Stoneking, Adine PARAS., MD  nystatin  cream (MYCOSTATIN ) Apply 1 Application topically 2 (two) times daily. 09/09/23   Stoneking, Adine PARAS., MD  OXYGEN  Inhale 3-5 L into the lungs daily. 3L Daily  4L Resting  5L Exertion    [provider]  polyethylene glycol (MIRALAX  / GLYCOLAX ) 17 g packet Take 17 g by mouth daily as needed. 02/10/23   Fairy Frames, MD  spironolactone  (ALDACTONE ) 25 MG tablet Take 1 tablet (25 mg total) by mouth daily. 05/13/23   Pietro Redell RAMAN, MD  torsemide  (DEMADEX ) 20 MG tablet Take 1 tablet (20 mg total) by mouth daily. 12/07/23   Pietro Redell RAMAN, MD     Physical Exam: Vitals:   06/02/24 1830 06/02/24 2010 06/02/24 2057 06/02/24 2210  BP: 110/72 118/79  (!) 141/77  Pulse: 81 84  86  Resp: 16 16  (!) 24  Temp:   98.7 F (37.1 C) 98.6 F (37  C)  TempSrc:   Oral Oral  SpO2: 99% 100%  96%  Weight:      Height:        Physical Exam Vitals and nursing note reviewed.  Constitutional:      General: He is not in acute distress.    Appearance: He is not ill-appearing.  HENT:     Mouth/Throat:     Mouth: Mucous membranes are moist.  Eyes:     Pupils: Pupils are equal, round, and reactive to light.  Cardiovascular:     Rate and Rhythm: Normal rate and regular rhythm.     Pulses: Normal pulses.     Heart sounds: Normal heart sounds.  Pulmonary:     Effort: Pulmonary effort is normal.     Breath sounds: Normal breath sounds.  Abdominal:     Tenderness: There is no abdominal tenderness. There is no right CVA tenderness or left CVA tenderness.  Genitourinary:    Comments: Gross hematuria  Foley catheter bag Musculoskeletal:     Cervical back: Neck supple.     Right lower leg: No edema.     Left lower leg: No edema.  Skin:    Capillary Refill: Capillary refill takes less than 2 seconds.  Neurological:     Mental Status: He is alert and oriented to person, place, and time.  Psychiatric:        Mood and Affect: Mood normal.      Labs on Admission: I have personally reviewed following labs and imaging studies  CBC: Recent Labs  Lab 06/02/24 1703  WBC 13.1*  NEUTROABS 8.6*  HGB 13.4  HCT 42.2  MCV 92.5  PLT 342   Basic Metabolic Panel: Recent Labs  Lab 06/02/24 1703  NA 139  K 5.7*  CL 105  CO2 24  GLUCOSE 64*  BUN 36*  CREATININE 2.11*  CALCIUM  9.7   GFR: Estimated Creatinine Clearance: 35.6 mL/min (A) (by C-G formula based on SCr of 2.11 mg/dL (H)). Liver Function Tests: No results for input(s): AST, ALT, ALKPHOS, BILITOT, PROT, ALBUMIN  in the last 168 hours. No results for input(s): LIPASE, AMYLASE in the last 168 hours. No results for input(s): AMMONIA in the last 168 hours. Coagulation Profile: No results for input(s): INR, PROTIME in the last 168 hours. Cardiac  Enzymes: No results for input(s): CKTOTAL, CKMB, CKMBINDEX, TROPONINI, TROPONINIHS in the last 168 hours. BNP (last 3 results) No results for input(s): BNP in the last 8760 hours. HbA1C: No results for input(s): HGBA1C in the last 72 hours. CBG: Recent Labs  Lab 06/02/24 2048 06/02/24 2133  GLUCAP 51* 71   Lipid Profile: No results for input(s): CHOL, HDL, LDLCALC, TRIG, CHOLHDL, LDLDIRECT in the last 72 hours. Thyroid  Function Tests: No results for input(s): TSH, T4TOTAL, FREET4, T3FREE, THYROIDAB in the last 72 hours. Anemia Panel: No results for input(s): VITAMINB12, FOLATE, FERRITIN, TIBC, IRON, RETICCTPCT in the last 72 hours. Urine analysis:    Component Value Date/Time   COLORURINE RED (A) 06/02/2024 1810   APPEARANCEUR HAZY (A) 06/02/2024 1810   APPEARANCEUR Hazy (A) 04/14/2023 1054   LABSPEC 1.006 06/02/2024 1810   PHURINE 5.5 06/02/2024 1810   GLUCOSEU NEGATIVE 06/02/2024 1810   GLUCOSEU NEGATIVE 03/02/2024 0940   HGBUR LARGE (A) 06/02/2024 1810   BILIRUBINUR NEGATIVE 06/02/2024 1810   BILIRUBINUR Negative 04/14/2023 1054   KETONESUR NEGATIVE 06/02/2024 1810   PROTEINUR NEGATIVE 06/02/2024 1810   UROBILINOGEN 0.2 03/02/2024 0940   NITRITE NEGATIVE 06/02/2024 1810   LEUKOCYTESUR TRACE (A) 06/02/2024 1810    Radiological Exams on Admission: I have personally reviewed images CT ABDOMEN PELVIS WO CONTRAST Result Date: 06/02/2024 EXAM: CT ABDOMEN AND PELVIS WITHOUT CONTRAST 06/02/2024 06:52:18 PM TECHNIQUE: CT of the abdomen and pelvis was performed without the administration of intravenous contrast. Multiplanar reformatted images are provided for review. Automated exposure control, iterative reconstruction, and/or weight-based adjustment of the mA/kV was utilized to reduce the radiation dose to as low as reasonably achievable. COMPARISON: 11/22/2023 CLINICAL HISTORY: Abdominal pain, acute, nonlocalized; gross hematuria.  FINDINGS: LOWER CHEST: No acute abnormality. LIVER: Nodular hepatic contour. Subcentimeter inferior right hepatic cyst (image 33), benign. GALLBLADDER AND BILE DUCTS: Suspected layering tiny gallstones (image 32). No biliary ductal dilatation. SPLEEN: No acute abnormality. PANCREAS: No acute abnormality. ADRENAL GLANDS: 10 mm fat density lesion in the left adrenal gland (image 23), compatible with a benign adrenal myelolipoma. Additional low density nodularity in the bilateral adrenal glands, unchanged, compatible with benign adrenal adenomas. KIDNEYS, URETERS AND BLADDER: Simple left renal cyst measuring up to 2.6 cm (image 27), benign. No follow-up is recommended. No stones in the kidneys or ureters. No hydronephrosis. No perinephric or periureteral stranding. Urinary bladder is decompressed by an indwelling Foley catheter. Mild perivesical stranding raising concern for cystitis, although similar to the prior/chronic. GI AND BOWEL: Stomach demonstrates no acute abnormality. There is no bowel obstruction. Prior appendectomy. PERITONEUM AND RETROPERITONEUM: No ascites. No free air. VASCULATURE: Atherosclerotic calcifications of the abdominal aorta and branch vessels, although patent. LYMPH NODES: No lymphadenopathy. REPRODUCTIVE ORGANS: Mild prostatomegaly. BONES AND SOFT TISSUES: No acute osseous abnormality. No focal soft tissue abnormality. IMPRESSION: 1. Mild perivesical stranding, raising concern for cystitis. Bladder decompressed by an indwelling Foley catheter. 2. Suspected layering tiny gallstones, without associated inflammatory changes. 3. Additional stable  ancillary findings, as above. Electronically signed by: Pinkie Pebbles MD 06/02/2024 07:08 PM EDT RP Workstation: [id number]     Assessment/Plan: Principal Problem:   Gross hematuria Active Problems:   Acute cystitis   Acute kidney injury superimposed on chronic kidney disease   Essential hypertension   BPH (benign prostatic hyperplasia)    Obstructive sleep apnea   Non-insulin  dependent type 2 diabetes mellitus (HCC)   Pulmonary hypertension (HCC)   Chronic hypoxic respiratory failure (HCC)   Chronic indwelling Foley catheter   Hyperkalemia   Paroxysmal atrial fibrillation (HCC)   History of gout   Hypoglycemia   MCI (mild cognitive impairment)   Sacral ulcer, limited to breakdown of skin (HCC)    Assessment and Plan: No notes have been filed under this hospital service. Service: Hospitalist Gross hematuria Acute cystitis Indwelling Foley catheter-catheter has been exchanged today 10/25 -Presented to emergency department complaining of gross hematuria.  Recently completed  cefdinir  for UTI.  I presentation to ED patient also reported catheter is not draining well with lower abdominal pain.  Denies any fever, chill, nausea, vomiting or flank pain - At presentation to ED patient is hemodynamically stable - CBC showed leukocytosis 13.1, normal H&H and platelet count. BMP showing evidence of AKI creatinine 2.11 and negative potassium 5.7. -UA showing red appearance hazy dipstick hemoglobin positive leukocyte esterase, RBC above 50, WBC 10-12 and and bacteria.  However initial UA showed evidence of bacteria. - CT abdomen pelvis showing decompensated bladder with indwelling Foley and acute cystitis. -Foley catheter has been exchanged in the ED today 10/25 - EDP discussed case with on-call urologist Dr. Lovie who recommended admission to [facility] for observation as patient may need cystoscopy if hematuria does not resolve.  -Initially after placing Foley hematuria improved afterward again started having hematuria.  During my evaluation at bedside patient has continued to have hematuria. -Continue to monitor for development of gross hematuria and need to inform urology in the morning to evaluate for cystoscopy. - Continue to treat with IV ceftriaxone  for cystitis.  Need to follow-up with urine  culture.   Hypoglycemia-resolved Non-insulin -dependent DM type II - At presentation to ED patient blood glucose found 51 which has been improved with oral dextrose .  Holding glipizide  in the setting of hyperglycemia. -Continue carb modified diet and sliding scale insulin  as needed coverage with mealtime.   Acute kidney injury superimposed CKD stage IIIb -Elevated creatinine 2.11.  Baseline creatinine around 1.8-1.9.  Postrenal and both prerenal acute kidney injury in the setting of acute cystitis with associated hematuria and side effect of torsemide /spironolactone  as well. - Holding torsemide  and spironolactone  - Continue to treat for UTI. - If patient continued to have hematuria urology will do cystoscopy for further evaluation - Checking urine creatinine and urine sodium.  UA show evidence of UTI and hematuria.  Pending urine culture - Starting maintenance fluid LR 100 cc/h. -Continue to monitor improvement of renal function, avoid nephrotoxic agent, and monitor I's/O.   Chronic hypoxic respiratory failure Pulmonary hypertension -Continue supplemental oxygen  continuously and BiPAP at bedtime.  Hyperkalemia -Elevated potassium 5.7.  Hyperkalemia in the setting of AKI.  Received Lokelma 10 g in the ED.  Continue monitor BMP  Paroxysmal atrial fibrillation -Continue metoprolol  twice daily.   Obtaining EKG. - Holding Eliquis  in the setting of gross hematuria.  Once hematuria resolves and hemoglobin remains stable will resume Eliquis .  History of gout -Holding allopurinol  in the setting of AKI.  Mild cognitive impairment - Continue donepezil   Cholelithiasis - CT abdomen pelvis showing tiny gallstone without any inflammatory change.  Obstructive sleep apnea Obesity hypoventilation syndrome Chronic hypoxic respiratory failure - Patient used 2 to 3 L oxygen  at the baseline.  And BiPAP at bedtime.  Sacral pressure ulcer stage I Left-sided ankle ulcer -Sacral ulcer area  pressure dressing has been placed.  Consulted wound care for further evaluation.   DVT prophylaxis:  SCDs and TED hose.  Holding Eliquis  in the setting of hematuria Code Status:  Full Code Diet: Heart healthy carb modified diet Family Communication:   Family was present at bedside, at the time of interview. Opportunity was given to ask question and all questions were answered satisfactorily.  Disposition Plan: Continue monitor improvement of hematuria.  If hematuria resolves can resume Eliquis . Consults: Urology Admission status:   Observation, Telemetry bed  Severity of Illness: The appropriate patient status for this patient is OBSERVATION. Observation status is judged to be reasonable and necessary in order to provide the required intensity of service to ensure the patient's safety. The patient's presenting symptoms, physical exam findings, and initial radiographic and laboratory data in the context of their medical condition is felt to place them at decreased risk for further clinical deterioration. Furthermore, it is anticipated that the patient will be medically stable for discharge from the hospital within 2 midnights of admission.     Adanya Sosinski, MD [facility]  How to contact the [facility] Attending or Consulting provider 7A - 7P or covering provider during after hours 7P -7A, for this patient.  Check the care team in [facility] and look for a) attending/consulting [facility] provider listed and b) the [facility] team listed Log into [email or web address] and use [place]'s universal password to access. If you do not have the password, please contact the hospital operator. Locate the [facility] provider you are looking for under [facility] and page to a number that you can be directly reached. If you still have difficulty reaching the provider, please page the [facility] (Director on Call) for the Hospitalists listed on amion for assistance.  06/02/2024, 10:43 PM

## 2024-06-02 NOTE — ED Notes (Signed)
 Pt was given cheese, ginger-ale, and ice cream to help improve Pt's blood sugar level. Pt's daughter is in the room with Pt to make sure Pt is eating and drinking.

## 2024-06-03 DIAGNOSIS — R310 Gross hematuria: Secondary | ICD-10-CM | POA: Diagnosis not present

## 2024-06-03 DIAGNOSIS — N3091 Cystitis, unspecified with hematuria: Secondary | ICD-10-CM | POA: Diagnosis not present

## 2024-06-03 LAB — CBC
HCT: 38.8 % — ABNORMAL LOW (ref 39.0–52.0)
Hemoglobin: 11.5 g/dL — ABNORMAL LOW (ref 13.0–17.0)
MCH: 28.2 pg (ref 26.0–34.0)
MCHC: 29.6 g/dL — ABNORMAL LOW (ref 30.0–36.0)
MCV: 95.1 fL (ref 80.0–100.0)
Platelets: 323 K/uL (ref 150–400)
RBC: 4.08 MIL/uL — ABNORMAL LOW (ref 4.22–5.81)
RDW: 14.9 % (ref 11.5–15.5)
WBC: 10.6 K/uL — ABNORMAL HIGH (ref 4.0–10.5)
nRBC: 0 % (ref 0.0–0.2)

## 2024-06-03 LAB — COMPREHENSIVE METABOLIC PANEL WITH GFR
ALT: 23 U/L (ref 0–44)
AST: 16 U/L (ref 15–41)
Albumin: 3 g/dL — ABNORMAL LOW (ref 3.5–5.0)
Alkaline Phosphatase: 66 U/L (ref 38–126)
Anion gap: 7 (ref 5–15)
BUN: 33 mg/dL — ABNORMAL HIGH (ref 8–23)
CO2: 28 mmol/L (ref 22–32)
Calcium: 9.5 mg/dL (ref 8.9–10.3)
Chloride: 107 mmol/L (ref 98–111)
Creatinine, Ser: 1.8 mg/dL — ABNORMAL HIGH (ref 0.61–1.24)
GFR, Estimated: 38 mL/min — ABNORMAL LOW (ref >60)
Glucose, Bld: 63 mg/dL — ABNORMAL LOW (ref 70–99)
Potassium: 4.7 mmol/L (ref 3.5–5.1)
Sodium: 142 mmol/L (ref 135–145)
Total Bilirubin: 0.7 mg/dL (ref 0.0–1.2)
Total Protein: 6.5 g/dL (ref 6.5–8.1)

## 2024-06-03 LAB — TYPE AND SCREEN: ABO/RH(D): O NEG

## 2024-06-03 LAB — CREATININE, URINE, RANDOM: Creatinine, Urine: 35 mg/dL

## 2024-06-03 LAB — BASIC METABOLIC PANEL WITH GFR
Anion gap: 7 (ref 5–15)
BUN: 36 mg/dL — ABNORMAL HIGH (ref 8–23)
CO2: 29 mmol/L (ref 22–32)
Calcium: 9.5 mg/dL (ref 8.9–10.3)
Chloride: 106 mmol/L (ref 98–111)
Creatinine, Ser: 1.96 mg/dL — ABNORMAL HIGH (ref 0.61–1.24)
GFR, Estimated: 34 mL/min — ABNORMAL LOW (ref >60)
Glucose, Bld: 99 mg/dL (ref 70–99)
Potassium: 4.4 mmol/L (ref 3.5–5.1)
Sodium: 142 mmol/L (ref 135–145)

## 2024-06-03 LAB — GLUCOSE, CAPILLARY
Glucose-Capillary: 76 mg/dL (ref 70–99)
Glucose-Capillary: 96 mg/dL (ref 70–99)
Glucose-Capillary: 98 mg/dL (ref 70–99)
Glucose-Capillary: 181 mg/dL — ABNORMAL HIGH (ref 70–99)

## 2024-06-03 LAB — SODIUM, URINE, RANDOM: Sodium, Ur: 56 mmol/L

## 2024-06-03 MED ORDER — GERHARDT'S BUTT CREAM
TOPICAL_CREAM | Freq: Two times a day (BID) | CUTANEOUS | Status: DC
  Filled 2024-06-03: qty 60

## 2024-06-03 MED ORDER — ORAL CARE MOUTH RINSE: 15.0000 mL | OROMUCOSAL | Status: DC | PRN

## 2024-06-03 NOTE — Progress Notes (Signed)
   06/03/24 2047  BiPAP/CPAP/SIPAP  BiPAP/CPAP/SIPAP Pt Type Adult  Reason BIPAP/CPAP not in use Non-compliant (Patient states he does not want to wear a bipap/cpap tonight. Patient aware to call RT if he changes his mind.)  BiPAP/CPAP /SiPAP Vitals  Resp (!) 25  MEWS Score/Color  MEWS Score 1  MEWS Score Color [place]

## 2024-06-03 NOTE — Consult Note (Signed)
 Urology Consult   Reason for consult: hematuria  History of Present Illness: [name] [name] is a 78 y.o. M who presented to [facility] ED last night c/o hematuria. CT scan obtained in ED; shows decompressed bladder with foley in place, no obvious clot material.   He is followed by Dr Roseann for urinary retention managed with indwelling foley.  Of note is on eliquis ; held as of yesterday  No acute complaints this AM  Past Medical History:  Diagnosis Date   Acute right-sided CHF (congestive heart failure) (HCC)    a. 01/2013.   Arthritis    knees and hands; thoracic area of the spine (01/05/2013)   BRONCHITIS, CHRONIC 01/02/2009   Chronic kidney disease    COMMON MIGRAINE    none since treating for high BP   Complication of anesthesia    aspiration pneumonia after hand OR (01/05/2013)   DIABETES MELLITUS, TYPE II    GOUT    HYPERLIPIDEMIA    HYPERTENSION    Long term (current) use of anticoagulants    Morbid obesity (HCC)    OBESITY HYPOVENTILATION SYNDROME    a. on home O2.   On home oxygen  therapy    OSA (obstructive sleep apnea)    Oxygen  deficiency    Permanent atrial fibrillation (HCC)    PROSTATE SPECIFIC ANTIGEN, ELEVATED 06/09/2007   Pulmonary HTN (HCC)    a. multifactorial including obstructive sleep apnea, obesity hypoventilation syndrome and possible pulmonary venous hypertension.   SKIN RASH 03/12/2010   Sleep apnea    Unspecified hearing loss     Past Surgical History:  Procedure Laterality Date   APPENDECTOMY  08/09/1972   APPLICATION OF ROBOTIC ASSISTANCE FOR SPINAL PROCEDURE N/A 11/23/2022   Procedure: APPLICATION OF ROBOTIC ASSISTANCE FOR SPINAL PROCEDURE;  Surgeon: Lanis Pupa, MD;  Location: [facility] OR;  Service: Neurosurgery;  Laterality: N/A;   CARDIOVERSION  05/21/2008; ~ 06/2008   FRACTURE SURGERY  2024   HERNIA REPAIR     INGUINAL HERNIA REPAIR Right    RIGHT HEART CATHETERIZATION N/A 01/08/2013   Procedure: RIGHT HEART CATH;  Surgeon: Aleene JINNY Passe, MD;  Location: [facility] CATH LAB;  Service: Cardiovascular;  Laterality: N/A;   SPINE SURGERY     TENDON REPAIR Right 08/10/2007   lacerated his tendon and pulley Dr. Lorretta   UMBILICAL HERNIA REPAIR      Current Hospital Medications:  Home Meds:  No current facility-administered medications on file prior to encounter.   Current Outpatient Medications on File Prior to Encounter  Medication Sig Dispense Refill   acetaminophen  (TYLENOL ) 500 MG tablet Take 500-1,000 mg by mouth every 6 (six) hours as needed for moderate pain (pain score 4-6).     allopurinol  (ZYLOPRIM ) 100 MG tablet Take 1 tablet (100 mg total) by mouth daily. 90 tablet 3   ammonium lactate (LAC-HYDRIN) 12 % lotion Apply 1 Application topically as needed for dry skin.     atorvastatin  (LIPITOR) 20 MG tablet Take 1 tablet (20 mg total) by mouth daily. 90 tablet 3   cholecalciferol  (VITAMIN D3) 25 MCG (1000 UNIT) tablet Take 2,000 Units by mouth daily.     cyanocobalamin  (VITAMIN B12) 1000 MCG/ML injection Inject 1 mL (1,000 mcg total) into the muscle every 30 (thirty) days. 3 mL 3   donepezil  (ARICEPT ) 5 MG tablet Take 1 tablet (5 mg total) by mouth at bedtime. 90 tablet 3   ELIQUIS  5 MG TABS tablet TAKE 1 TABLET TWICE A DAY 180 tablet 3  glipiZIDE  (GLUCOTROL  XL) 10 MG 24 hr tablet TAKE 1 TABLET(10 MG) BY MOUTH DAILY WITH BREAKFAST 90 tablet 3   metoprolol  tartrate (LOPRESSOR ) 25 MG tablet TAKE 1 TABLET(25 MG) BY MOUTH TWICE DAILY 180 tablet 3   nystatin  (MYCOSTATIN /NYSTOP ) powder Apply 1 Application topically 2 (two) times daily. 60 g 3   nystatin  cream (MYCOSTATIN ) Apply 1 Application topically 2 (two) times daily. 30 g 4   OXYGEN  Inhale 3-5 L into the lungs daily. 3L Daily  4L Resting  5L Exertion     polyethylene glycol (MIRALAX  / GLYCOLAX ) 17 g packet Take 17 g by mouth daily as needed. 14 each 0   spironolactone  (ALDACTONE ) 25 MG tablet Take 1 tablet (25 mg total) by mouth daily. 90 tablet 3   torsemide  (DEMADEX )  20 MG tablet Take 1 tablet (20 mg total) by mouth daily. 90 tablet 1     Scheduled Meds:  atorvastatin   20 mg Oral Daily   donepezil   5 mg Oral QHS   metoprolol  tartrate  25 mg Oral BID   sodium chloride  flush  3 mL Intravenous Q12H   sodium chloride  flush  3 mL Intravenous Q12H   Continuous Infusions:  sodium chloride      cefTRIAXone  (ROCEPHIN )  IV     lactated ringers  100 mL/hr at 06/03/24 0400   PRN Meds:.sodium chloride , acetaminophen  **OR** acetaminophen , insulin  aspart, ondansetron  **OR** ondansetron  (ZOFRAN ) IV, mouth rinse, polyethylene glycol, sodium chloride  flush  Allergies:  Allergies  Allergen Reactions   Codeine     Altered mental status goofy   Heparin  Rash     Abdominal rash 01/2013    Family History  Problem Relation Age of Onset   Alzheimer's disease Father    Prostate cancer Father    Stroke Father    Hearing loss Brother    Cancer Other        Breast Cancer, <50 yo 1st degree relative   Coronary artery disease Other        Male, 1st degree relative   Obesity Brother     Social History:  reports that he quit smoking about 45 years ago. His smoking use included cigarettes. He started smoking about 60 years ago. He has a 15 pack-year smoking history. He has never used smokeless tobacco. He reports current alcohol use. He reports that he does not use drugs.  ROS: A complete review of systems was performed.  All systems are negative except for pertinent findings as noted.  Physical Exam:  Vital signs in last 24 hours: Temp:  [97.8 F (36.6 C)-99.7 F (37.6 C)] 97.8 F (36.6 C) (10/26 0128) Pulse Rate:  [73-86] 73 (10/26 0128) Resp:  [16-24] 20 (10/26 0128) BP: (101-144)/(68-81) 101/68 (10/26 0128) SpO2:  [95 %-100 %] 98 % (10/26 0128) Weight:  [115.4 kg] 115.4 kg (10/25 1545) Constitutional:  Alert and oriented, No acute distress Cardiovascular: Regular rate and rhythm Respiratory: Normal respiratory effort, Lungs clear bilaterally GI:  Abdomen is soft, nontender, nondistended, no abdominal masses GU: No CVA tenderness Neurologic: Grossly intact, no focal deficits Psychiatric: Normal mood and affect  Laboratory Data:  Recent Labs    06/02/24 1703 06/03/24 0440  WBC 13.1* 10.6*  HGB 13.4 11.5*  HCT 42.2 38.8*  PLT 342 323    Recent Labs    06/02/24 1703 06/02/24 2312 06/03/24 0440  NA 139 142 142  K 5.7* 4.4 4.7  CL 105 106 107  GLUCOSE 64* 99 63*  BUN 36* 36* 33*  CALCIUM   9.7 9.5 9.5  CREATININE 2.11* 1.96* 1.80*     Results for orders placed or performed during the hospital encounter of 06/02/24 (from the past 24 hours)  Basic metabolic panel     Status: Abnormal   Collection Time: 06/02/24  5:03 PM  Result Value Ref Range   Sodium 139 135 - 145 mmol/L   Potassium 5.7 (H) 3.5 - 5.1 mmol/L   Chloride 105 98 - 111 mmol/L   CO2 24 22 - 32 mmol/L   Glucose, Bld 64 (L) 70 - 99 mg/dL   BUN 36 (H) 8 - 23 mg/dL   Creatinine, Ser 7.88 (H) 0.61 - 1.24 mg/dL   Calcium  9.7 8.9 - 10.3 mg/dL   GFR, Estimated 31 (L) >60 mL/min   Anion gap 10 5 - 15  CBC with Differential     Status: Abnormal   Collection Time: 06/02/24  5:03 PM  Result Value Ref Range   WBC 13.1 (H) 4.0 - 10.5 K/uL   RBC 4.56 4.22 - 5.81 MIL/uL   Hemoglobin 13.4 13.0 - 17.0 g/dL   HCT 57.7 60.9 - 47.9 %   MCV 92.5 80.0 - 100.0 fL   MCH 29.4 26.0 - 34.0 pg   MCHC 31.8 30.0 - 36.0 g/dL   RDW 84.9 88.4 - 84.4 %   Platelets 342 150 - 400 K/uL   nRBC 0.0 0.0 - 0.2 %   Neutrophils Relative % 64 %   Neutro Abs 8.6 (H) 1.7 - 7.7 K/uL   Lymphocytes Relative 24 %   Lymphs Abs 3.1 0.7 - 4.0 K/uL   Monocytes Relative 8 %   Monocytes Absolute 1.0 0.1 - 1.0 K/uL   Eosinophils Relative 3 %   Eosinophils Absolute 0.4 0.0 - 0.5 K/uL   Basophils Relative 1 %   Basophils Absolute 0.1 0.0 - 0.1 K/uL   Immature Granulocytes 0 %   Abs Immature Granulocytes 0.03 0.00 - 0.07 K/uL  Lactic acid, plasma     Status: None   Collection Time: 06/02/24   5:03 PM  Result Value Ref Range   Lactic Acid, Venous 1.5 0.5 - 1.9 mmol/L  Urinalysis, w/ Reflex to Culture (Infection Suspected) -Urine, Catheterized; Indwelling urinary catheter     Status: Abnormal   Collection Time: 06/02/24  5:03 PM  Result Value Ref Range   Specimen Source URINE, CATHETERIZED    Color, Urine RED (A) YELLOW   APPearance TURBID (A) CLEAR   Specific Gravity, Urine  1.005 - 1.030    TEST NOT REPORTED DUE TO COLOR INTERFERENCE OF URINE PIGMENT   pH  5.0 - 8.0    TEST NOT REPORTED DUE TO COLOR INTERFERENCE OF URINE PIGMENT   Glucose, UA (A) NEGATIVE mg/dL    TEST NOT REPORTED DUE TO COLOR INTERFERENCE OF URINE PIGMENT   Hgb urine dipstick (A) NEGATIVE    TEST NOT REPORTED DUE TO COLOR INTERFERENCE OF URINE PIGMENT   Bilirubin Urine (A) NEGATIVE    TEST NOT REPORTED DUE TO COLOR INTERFERENCE OF URINE PIGMENT   Ketones, ur (A) NEGATIVE mg/dL    TEST NOT REPORTED DUE TO COLOR INTERFERENCE OF URINE PIGMENT   Protein, ur (A) NEGATIVE mg/dL    TEST NOT REPORTED DUE TO COLOR INTERFERENCE OF URINE PIGMENT   Nitrite (A) NEGATIVE    TEST NOT REPORTED DUE TO COLOR INTERFERENCE OF URINE PIGMENT   Leukocytes,Ua (A) NEGATIVE    TEST NOT REPORTED DUE TO COLOR INTERFERENCE OF URINE PIGMENT  Squamous Epithelial / HPF 0-5 0 - 5 /HPF   WBC, UA 0-5 0 - 5 WBC/hpf   RBC / HPF >50 0 - 5 RBC/hpf   Bacteria, UA RARE (A) NONE SEEN  Urinalysis, w/ Reflex to Culture (Infection Suspected) -Urine, Clean Catch     Status: Abnormal   Collection Time: 06/02/24  6:10 PM  Result Value Ref Range   Specimen Source URINE, CLEAN CATCH    Color, Urine RED (A) YELLOW   APPearance HAZY (A) CLEAR   Specific Gravity, Urine 1.006 1.005 - 1.030   pH 5.5 5.0 - 8.0   Glucose, UA NEGATIVE NEGATIVE mg/dL   Hgb urine dipstick LARGE (A) NEGATIVE   Bilirubin Urine NEGATIVE NEGATIVE   Ketones, ur NEGATIVE NEGATIVE mg/dL   Protein, ur NEGATIVE NEGATIVE mg/dL   Nitrite NEGATIVE NEGATIVE   Leukocytes,Ua TRACE  (A) NEGATIVE   RBC / HPF >50 0 - 5 RBC/hpf   WBC, UA 11-20 0 - 5 WBC/hpf   Bacteria, UA NONE SEEN NONE SEEN   Squamous Epithelial / HPF 0-5 0 - 5 /HPF  CBG monitoring, ED     Status: Abnormal   Collection Time: 06/02/24  8:48 PM  Result Value Ref Range   Glucose-Capillary 51 (L) 70 - 99 mg/dL  CBG monitoring, ED     Status: None   Collection Time: 06/02/24  9:33 PM  Result Value Ref Range   Glucose-Capillary 71 70 - 99 mg/dL  Type and screen [facility]     Status: None   Collection Time: 06/02/24 11:07 PM  Result Value Ref Range   ABO/RH(D) O NEG    Antibody Screen NEG    Sample Expiration      06/05/2024,2359 Performed at [facility], [address], [place], [place] [address]   Basic metabolic panel     Status: Abnormal   Collection Time: 06/02/24 11:12 PM  Result Value Ref Range   Sodium 142 135 - 145 mmol/L   Potassium 4.4 3.5 - 5.1 mmol/L   Chloride 106 98 - 111 mmol/L   CO2 29 22 - 32 mmol/L   Glucose, Bld 99 70 - 99 mg/dL   BUN 36 (H) 8 - 23 mg/dL   Creatinine, Ser 8.03 (H) 0.61 - 1.24 mg/dL   Calcium  9.5 8.9 - 10.3 mg/dL   GFR, Estimated 34 (L) >60 mL/min   Anion gap 7 5 - 15  Comprehensive metabolic panel     Status: Abnormal   Collection Time: 06/03/24  4:40 AM  Result Value Ref Range   Sodium 142 135 - 145 mmol/L   Potassium 4.7 3.5 - 5.1 mmol/L   Chloride 107 98 - 111 mmol/L   CO2 28 22 - 32 mmol/L   Glucose, Bld 63 (L) 70 - 99 mg/dL   BUN 33 (H) 8 - 23 mg/dL   Creatinine, Ser 8.19 (H) 0.61 - 1.24 mg/dL   Calcium  9.5 8.9 - 10.3 mg/dL   Total Protein 6.5 6.5 - 8.1 g/dL   Albumin  3.0 (L) 3.5 - 5.0 g/dL   AST 16 15 - 41 U/L   ALT 23 0 - 44 U/L   Alkaline Phosphatase 66 38 - 126 U/L   Total Bilirubin 0.7 0.0 - 1.2 mg/dL   GFR, Estimated 38 (L) >60 mL/min   Anion gap 7 5 - 15  CBC     Status: Abnormal   Collection Time: 06/03/24  4:40 AM  Result Value Ref Range  WBC 10.6 (H) 4.0 - 10.5 K/uL   RBC 4.08 (L) 4.22 -  5.81 MIL/uL   Hemoglobin 11.5 (L) 13.0 - 17.0 g/dL   HCT 61.1 (L) 60.9 - 47.9 %   MCV 95.1 80.0 - 100.0 fL   MCH 28.2 26.0 - 34.0 pg   MCHC 29.6 (L) 30.0 - 36.0 g/dL   RDW 85.0 88.4 - 84.4 %   Platelets 323 150 - 400 K/uL   nRBC 0.0 0.0 - 0.2 %   No results found for this or any previous visit (from the past 240 hours).  Renal Function: Recent Labs    06/02/24 1703 06/02/24 2312 06/03/24 0440  CREATININE 2.11* 1.96* 1.80*   Estimated Creatinine Clearance: 41.7 mL/min (A) (by C-G formula based on SCr of 1.8 mg/dL (H)).  Radiologic Imaging: CT ABDOMEN PELVIS WO CONTRAST Result Date: 06/02/2024 EXAM: CT ABDOMEN AND PELVIS WITHOUT CONTRAST 06/02/2024 06:52:18 PM TECHNIQUE: CT of the abdomen and pelvis was performed without the administration of intravenous contrast. Multiplanar reformatted images are provided for review. Automated exposure control, iterative reconstruction, and/or weight-based adjustment of the mA/kV was utilized to reduce the radiation dose to as low as reasonably achievable. COMPARISON: 11/22/2023 CLINICAL HISTORY: Abdominal pain, acute, nonlocalized; gross hematuria. FINDINGS: LOWER CHEST: No acute abnormality. LIVER: Nodular hepatic contour. Subcentimeter inferior right hepatic cyst (image 33), benign. GALLBLADDER AND BILE DUCTS: Suspected layering tiny gallstones (image 32). No biliary ductal dilatation. SPLEEN: No acute abnormality. PANCREAS: No acute abnormality. ADRENAL GLANDS: 10 mm fat density lesion in the left adrenal gland (image 23), compatible with a benign adrenal myelolipoma. Additional low density nodularity in the bilateral adrenal glands, unchanged, compatible with benign adrenal adenomas. KIDNEYS, URETERS AND BLADDER: Simple left renal cyst measuring up to 2.6 cm (image 27), benign. No follow-up is recommended. No stones in the kidneys or ureters. No hydronephrosis. No perinephric or periureteral stranding. Urinary bladder is decompressed by an indwelling  Foley catheter. Mild perivesical stranding raising concern for cystitis, although similar to the prior/chronic. GI AND BOWEL: Stomach demonstrates no acute abnormality. There is no bowel obstruction. Prior appendectomy. PERITONEUM AND RETROPERITONEUM: No ascites. No free air. VASCULATURE: Atherosclerotic calcifications of the abdominal aorta and branch vessels, although patent. LYMPH NODES: No lymphadenopathy. REPRODUCTIVE ORGANS: Mild prostatomegaly. BONES AND SOFT TISSUES: No acute osseous abnormality. No focal soft tissue abnormality. IMPRESSION: 1. Mild perivesical stranding, raising concern for cystitis. Bladder decompressed by an indwelling Foley catheter. 2. Suspected layering tiny gallstones, without associated inflammatory changes. 3. Additional stable ancillary findings, as above. Electronically signed by: Pinkie Pebbles MD 06/02/2024 07:08 PM EDT RP Workstation: [id number]    I independently reviewed the above imaging studies.  Impression/Recommendation 78 yo M with history of urinary retention managed with indwelling foley; hematuria.   --no acute intervention indicated --urine OK this AM; some clots in the tubing; hand irrigated with very small return of clots. --can start CBI if hematuria increases (has 24 fr 3 way in currently)  Herlene Foot MD 06/03/2024, 6:17 AM  [facility]  Pager: [phone]

## 2024-06-03 NOTE — Consult Note (Addendum)
 WOC Nurse Consult Note: Reason for Consult: buttocks  Wound type: 1.  Stage 2 Pressure Injury buttocks  pink dry with surrounding hyperkeratotic and peeling skin; raised nodule noted to left buttock that has been there for a year per patient and family, not painful or indurated  2.  Moisture Associated Skin Damage buttocks/scrotum hyperkeratotic and peeling skin likely d/t to moisture and friction  3.  Full thickness L lower leg likely r/t venous insufficiency vs trauma red moist  ICD-10 CM Codes for Irritant Dermatitis L24A0 - Due to friction or contact with body fluids; unspecified  L30.4  - Erythema intertrigo. Also used for abrasion of the hand, chafing of the skin, dermatitis due to sweating and friction, friction dermatitis, friction eczema, and genital/thigh intertrigo.  Pressure Injury POA: Yes Measurement: see nursing flowsheet  Wound bed: as above  Drainage (amount, consistency, odor) appears largely dry  Periwound:as above  Dressing procedure/placement/frequency: Cleanse buttocks/perineum and scrotum with soap and water, dry and apply Gerhardt's Butt Cream 2 times a day and prn soiling. Cover sacrum/buttocks with silicone foam.   Cleanse L lower leg wound with NS, apply Xeroform gauze Soila [phone]) to wound bed every other day and secure with silicone foam.  SOAK DRESSING WITH NS IF ADHERED TO WOUND BED.   POC discussed with bedside nurse.  WOC team will not follow.Re-consult if further needs arise.   Thank you,    Powell Bar MSN, RN-BC, [employer]

## 2024-06-03 NOTE — Care Management Obs Status (Signed)
 MEDICARE OBSERVATION STATUS NOTIFICATION   Patient Details  Name: [name] [name] MRN: [id number] Date of Birth: [birth year]   Medicare Observation Status Notification Given:  Yes    Jon ONEIDA Anon, RN 06/03/2024, 4:33 PM

## 2024-06-03 NOTE — Progress Notes (Signed)
 PROGRESS NOTE    [name] [name]  FMW:[id number]  DOB: 05/10/1946  DOA: 06/02/2024 PCP: Norleen Lynwood ORN, MD Outpatient Specialists:   Hospital course:  78 y.o. male with medical history significant of A-fib on Eliquis , HFpEF, pulmonary hypertension, CKD stage IIIb, type 2 diabetes, hypertension, chronic hypoxic respiratory failure 2 to 3 L oxygen  at bedtime, OSA on BiPAP at bedtime, hyperlipidemia, OSA/OHS, gout and BPH with chronic indwelling Foley catheter is admitted with complaints of gross hematuria.  Patient was seen by urology who irrigated the Foley.  Eliquis  is being held.   Subjective:  Patient states he is doing okay, and has no pain, no chest pain or dizziness Discussed with him that we are holding his Eliquis  and that he may be at increased risk of stroke given his known A-fib.  Patient and wife understand need to hold Eliquis  and in agreement.   Objective: Vitals:   06/03/24 0128 06/03/24 0617 06/03/24 1006 06/03/24 1250  BP: 101/68 115/83 124/73 112/76  Pulse: 73 84 81 65  Resp: 20 (!) 21 19 19   Temp: 97.8 F (36.6 C) 97.8 F (36.6 C) 98.4 F (36.9 C) 98.6 F (37 C)  TempSrc: Oral Oral Oral Oral  SpO2: 98% 98% 100% 99%  Weight:      Height:        Intake/Output Summary (Last 24 hours) at 06/03/2024 1823 Last data filed at 06/03/2024 1738 Gross per 24 hour  Intake 1219.36 ml  Output 1000 ml  Net 219.36 ml   Filed Weights   06/02/24 1545  Weight: 115.4 kg     Exam:  General: Patient sitting up in bed with Foley with light pink urine and a few clots in tube in NAD Eyes: sclera anicteric, conjuctiva mild injection bilaterally CVS: S1-S2, regular  Respiratory:  decreased air entry bilaterally secondary to decreased inspiratory effort, rales at bases  GI: NABS, soft, NT  LE: Warm and well-perfused  Data Reviewed:  Basic Metabolic Panel: Recent Labs  Lab 06/02/24 1703 06/02/24 2312 06/03/24 0440  NA 139 142 142  K 5.7* 4.4 4.7  CL 105 106 107   CO2 24 29 28   GLUCOSE 64* 99 63*  BUN 36* 36* 33*  CREATININE 2.11* 1.96* 1.80*  CALCIUM  9.7 9.5 9.5    CBC: Recent Labs  Lab 06/02/24 1703 06/03/24 0440  WBC 13.1* 10.6*  NEUTROABS 8.6*  --   HGB 13.4 11.5*  HCT 42.2 38.8*  MCV 92.5 95.1  PLT 342 323     Scheduled Meds:  atorvastatin   20 mg Oral Daily   donepezil   5 mg Oral QHS   Gerhardt's butt cream   Topical BID   metoprolol  tartrate  25 mg Oral BID   sodium chloride  flush  3 mL Intravenous Q12H   sodium chloride  flush  3 mL Intravenous Q12H   Continuous Infusions:  sodium chloride      cefTRIAXone  (ROCEPHIN )  IV     lactated ringers  100 mL/hr at 06/03/24 1004     Assessment & Plan:   Gross hematuria Acute cystitis Indwelling Foley Chronic anticoagulation Patient is hemodynamically stable with stable blood pressure and H&H UA with positive leuk esterase and bacteria Foley was changed out and irrigated in ED Started on ceftriaxone  Patient was seen by urology and was irrigated  Eliquis  is being held Patient has light pink urine in Foley tube with a few clots Urology is following  DM 2 Glipizide  is being held for glucose 51 in ED Blood  sugars are presently normal Continue SSI  PAF Rate is controlled on metoprolol  Eliquis  being held for gross hematuria  CKD 3B Creatinine improved with treatment of infection and holding of torsemide   Chronic hypoxic respiratory failure Obesity hypoventilation syndrome Pulmonary hypertension Clinically at baseline, continue O2 and BiPAP per home doses  MCI Continue donepezil   From admission note: Sacral pressure ulcer stage I Left-sided ankle ulcer -Sacral ulcer area pressure dressing has been placed.  Consulted wound care for further evaluation.   DVT prophylaxis: SCD Code Status: Full Family Communication: Wife was at bedside throughout     Studies: CT ABDOMEN PELVIS WO CONTRAST Result Date: 06/02/2024 EXAM: CT ABDOMEN AND PELVIS WITHOUT CONTRAST  06/02/2024 06:52:18 PM TECHNIQUE: CT of the abdomen and pelvis was performed without the administration of intravenous contrast. Multiplanar reformatted images are provided for review. Automated exposure control, iterative reconstruction, and/or weight-based adjustment of the mA/kV was utilized to reduce the radiation dose to as low as reasonably achievable. COMPARISON: 11/22/2023 CLINICAL HISTORY: Abdominal pain, acute, nonlocalized; gross hematuria. FINDINGS: LOWER CHEST: No acute abnormality. LIVER: Nodular hepatic contour. Subcentimeter inferior right hepatic cyst (image 33), benign. GALLBLADDER AND BILE DUCTS: Suspected layering tiny gallstones (image 32). No biliary ductal dilatation. SPLEEN: No acute abnormality. PANCREAS: No acute abnormality. ADRENAL GLANDS: 10 mm fat density lesion in the left adrenal gland (image 23), compatible with a benign adrenal myelolipoma. Additional low density nodularity in the bilateral adrenal glands, unchanged, compatible with benign adrenal adenomas. KIDNEYS, URETERS AND BLADDER: Simple left renal cyst measuring up to 2.6 cm (image 27), benign. No follow-up is recommended. No stones in the kidneys or ureters. No hydronephrosis. No perinephric or periureteral stranding. Urinary bladder is decompressed by an indwelling Foley catheter. Mild perivesical stranding raising concern for cystitis, although similar to the prior/chronic. GI AND BOWEL: Stomach demonstrates no acute abnormality. There is no bowel obstruction. Prior appendectomy. PERITONEUM AND RETROPERITONEUM: No ascites. No free air. VASCULATURE: Atherosclerotic calcifications of the abdominal aorta and branch vessels, although patent. LYMPH NODES: No lymphadenopathy. REPRODUCTIVE ORGANS: Mild prostatomegaly. BONES AND SOFT TISSUES: No acute osseous abnormality. No focal soft tissue abnormality. IMPRESSION: 1. Mild perivesical stranding, raising concern for cystitis. Bladder decompressed by an indwelling Foley catheter.  2. Suspected layering tiny gallstones, without associated inflammatory changes. 3. Additional stable ancillary findings, as above. Electronically signed by: Pinkie Pebbles MD 06/02/2024 07:08 PM EDT RP Workstation: [id number]    Principal Problem:   Gross hematuria Active Problems:   Acute cystitis   Acute kidney injury superimposed on chronic kidney disease   Essential hypertension   BPH (benign prostatic hyperplasia)   Obstructive sleep apnea   Non-insulin  dependent type 2 diabetes mellitus (HCC)   Pulmonary hypertension (HCC)   Chronic hypoxic respiratory failure (HCC)   Chronic indwelling Foley catheter   Hyperkalemia   Paroxysmal atrial fibrillation (HCC)   History of gout   Hypoglycemia   MCI (mild cognitive impairment)   Sacral ulcer, limited to breakdown of skin (HCC)     Kamee Bobst Tublu Isidor Bromell, [facility]  If 7PM-7AM, please contact night-coverage [email or web address]   LOS: 0 days

## 2024-06-04 DIAGNOSIS — N3001 Acute cystitis with hematuria: Secondary | ICD-10-CM | POA: Diagnosis not present

## 2024-06-04 DIAGNOSIS — N3091 Cystitis, unspecified with hematuria: Secondary | ICD-10-CM | POA: Diagnosis not present

## 2024-06-04 LAB — CBC
HCT: 39.6 % (ref 39.0–52.0)
Hemoglobin: 11.8 g/dL — ABNORMAL LOW (ref 13.0–17.0)
MCH: 28.8 pg (ref 26.0–34.0)
MCHC: 29.8 g/dL — ABNORMAL LOW (ref 30.0–36.0)
MCV: 96.6 fL (ref 80.0–100.0)
Platelets: 329 K/uL (ref 150–400)
RBC: 4.1 MIL/uL — ABNORMAL LOW (ref 4.22–5.81)
RDW: 14.9 % (ref 11.5–15.5)
WBC: 10.8 K/uL — ABNORMAL HIGH (ref 4.0–10.5)
nRBC: 0 % (ref 0.0–0.2)

## 2024-06-04 LAB — BASIC METABOLIC PANEL WITH GFR
Anion gap: 5 (ref 5–15)
BUN: 28 mg/dL — ABNORMAL HIGH (ref 8–23)
CO2: 28 mmol/L (ref 22–32)
Calcium: 9.4 mg/dL (ref 8.9–10.3)
Chloride: 107 mmol/L (ref 98–111)
Creatinine, Ser: 1.65 mg/dL — ABNORMAL HIGH (ref 0.61–1.24)
GFR, Estimated: 42 mL/min — ABNORMAL LOW (ref >60)
Glucose, Bld: 120 mg/dL — ABNORMAL HIGH (ref 70–99)
Potassium: 5.8 mmol/L — ABNORMAL HIGH (ref 3.5–5.1)
Sodium: 140 mmol/L (ref 135–145)

## 2024-06-04 LAB — URINE CULTURE: Culture: NO GROWTH

## 2024-06-04 LAB — GLUCOSE, CAPILLARY
Glucose-Capillary: 99 mg/dL (ref 70–99)
Glucose-Capillary: 155 mg/dL — ABNORMAL HIGH (ref 70–99)

## 2024-06-04 MED ORDER — CEPHALEXIN 500 MG PO CAPS: 500.0000 mg | ORAL_CAPSULE | Freq: Three times a day (TID) | ORAL | 0 refills | Status: AC

## 2024-06-04 MED ORDER — SODIUM ZIRCONIUM CYCLOSILICATE 10 G PO PACK
10.0000 g | PACK | Freq: Two times a day (BID) | ORAL | Status: DC
Start: 2024-06-04 — End: 2024-06-04
  Administered 2024-06-04: 10 g via ORAL
  Filled 2024-06-04: qty 1

## 2024-06-04 NOTE — Progress Notes (Signed)
 Subjective: Patient resting in bed.  No acute events overnight.  Notably hard of hearing.  Reviewed case and plan.  Clear yellow urine today  Objective: Vital signs in last 24 hours: Temp:  [97.8 F (36.6 C)-98.6 F (37 C)] 97.8 F (36.6 C) (10/27 0432) Pulse Rate:  [65-81] 69 (10/27 0432) Resp:  [17-25] 17 (10/27 0432) BP: (108-136)/(67-76) 136/67 (10/27 0432) SpO2:  [96 %-100 %] 97 % (10/27 0432)  Assessment/Plan: # Hematuria # Chronic Foley catheter # Chronic anticoagulation  Trend labs.  Presenting Hgb 13.4-[name] [name], 11.4, with interval improvement to 11.8 today. No hematuria noted on rounds.  Clear yellow urine in tubing. I cannot access the EKGs in the chart to see if patient is in chronic A-fib or if this is purely preventative.  We will defer to primary team to discuss CHA2DS2-VASc and long-term plans for anticoagulation.  If patient requires Eliquis , please wait until he has been free of bleeding for 24 hours prior to trialing. Okay to discharge once medically cleared.  Urology will sign off at this time.  Please feel free to call with questions or concerns  Intake/Output from previous day: 10/26 0701 - 10/27 0700 In: 980 [P.O.:480; I.V.:400; IV Piggyback:100] Out: 2325 [Urine:2325]  Intake/Output this shift: Total I/O In: -  Out: 400 [Urine:400]  Physical Exam:  General: Alert and oriented CV: No cyanosis Lungs: equal chest rise Abdomen: Soft, NTND, no rebound or guarding Gu: Foley catheter with clear yellow urine  Lab Results: Recent Labs    06/02/24 1703 06/03/24 0440 06/04/24 0349  HGB 13.4 11.5* 11.8*  HCT 42.2 38.8* 39.6   BMET Recent Labs    06/03/24 0440 06/04/24 0349  NA 142 140  K 4.7 5.8*  CL 107 107  CO2 28 28  GLUCOSE 63* 120*  BUN 33* 28*  CREATININE 1.80* 1.65*  CALCIUM  9.5 9.4  HGB 11.5* 11.8*  WBC 10.6* 10.8*     Studies/Results: CT ABDOMEN PELVIS WO CONTRAST Result Date: 06/02/2024 EXAM: CT ABDOMEN AND PELVIS WITHOUT  CONTRAST 06/02/2024 06:52:18 PM TECHNIQUE: CT of the abdomen and pelvis was performed without the administration of intravenous contrast. Multiplanar reformatted images are provided for review. Automated exposure control, iterative reconstruction, and/or weight-based adjustment of the mA/kV was utilized to reduce the radiation dose to as low as reasonably achievable. COMPARISON: 11/22/2023 CLINICAL HISTORY: Abdominal pain, acute, nonlocalized; gross hematuria. FINDINGS: LOWER CHEST: No acute abnormality. LIVER: Nodular hepatic contour. Subcentimeter inferior right hepatic cyst (image 33), benign. GALLBLADDER AND BILE DUCTS: Suspected layering tiny gallstones (image 32). No biliary ductal dilatation. SPLEEN: No acute abnormality. PANCREAS: No acute abnormality. ADRENAL GLANDS: 10 mm fat density lesion in the left adrenal gland (image 23), compatible with a benign adrenal myelolipoma. Additional low density nodularity in the bilateral adrenal glands, unchanged, compatible with benign adrenal adenomas. KIDNEYS, URETERS AND BLADDER: Simple left renal cyst measuring up to 2.6 cm (image 27), benign. No follow-up is recommended. No stones in the kidneys or ureters. No hydronephrosis. No perinephric or periureteral stranding. Urinary bladder is decompressed by an indwelling Foley catheter. Mild perivesical stranding raising concern for cystitis, although similar to the prior/chronic. GI AND BOWEL: Stomach demonstrates no acute abnormality. There is no bowel obstruction. Prior appendectomy. PERITONEUM AND RETROPERITONEUM: No ascites. No free air. VASCULATURE: Atherosclerotic calcifications of the abdominal aorta and branch vessels, although patent. LYMPH NODES: No lymphadenopathy. REPRODUCTIVE ORGANS: Mild prostatomegaly. BONES AND SOFT TISSUES: No acute osseous abnormality. No focal soft tissue abnormality. IMPRESSION: 1. Mild  perivesical stranding, raising concern for cystitis. Bladder decompressed by an indwelling Foley  catheter. 2. Suspected layering tiny gallstones, without associated inflammatory changes. 3. Additional stable ancillary findings, as above. Electronically signed by: Pinkie Pebbles MD 06/02/2024 07:08 PM EDT RP Workstation: [id number]      LOS: 0 days   [name] Bourdon, NP [facility] Pager: [phone]  06/04/2024, 9:20 AM

## 2024-06-04 NOTE — Plan of Care (Signed)

## 2024-06-04 NOTE — Progress Notes (Signed)
 PT Cancellation Note  Patient Details Name: [name] [name] MRN: [id number] DOB: 17-Aug-1945   Cancelled Treatment:    Reason Eval/Treat Not Completed: PT screened, no needs identified, will sign off  Per OT and RN no PT needs at this time   [facility] 06/04/2024, 12:25 PM

## 2024-06-04 NOTE — Evaluation (Signed)
 Occupational Therapy Evaluation Patient Details Name: [name] [name] MRN: [id number] DOB: Mar 22, 1946 Today's Date: 06/04/2024   History of Present Illness   [name] [name] is a 78 y.o. male admitted with  Acute cystitis/gross hematuria, Acute kidney injury superimposed on chronic kidney disease PMH: chronic BPH with indwelling Foley catheter, additional history of HFpEF, chronic A-fib on AC, pulmonary hypertension, OHS/OSA, CHHRF on 3L O2, BiPAP at night, CKD stage IIIb, hypertension, hyperlipidemia, diabetes     Clinical Impressions PTA, patient lives at home with family who provide assist with LB ADL's and IADL's, am with rollator household distances and patient uses w/c with assist for all community mobility which patient reports is limited to occasional trips to church. Patient reports he has baseline O2 concentrator on 3-4 ltrs, home health nursing for his Foley management and a w/c, a ramp, hospital bed and all needed DME/AE. Currently, patient presents with deficits outlined below (see OT Problem List for details) most significantly decreased activity tolerance, balance, skin integrity and supplemental O2 needs impacting BADL's and functional mobility. Prior to OT session, patient amb in hallway with nursing and was out in recliner on his baseline 3 ltrs. With OT this visit, used RW for amb in room, stood sink side and out to the chair and he remained 100% on his baseline 3 ltrs O2 throughout session. Patient reports HH therapy had stopped since he made gains and patient reports no need for restarting. Educated patient on skin protection, energy conservation and breathing integration with + teach back.  OT will s/o at this time as patient at baseline functional level.     If plan is discharge home, recommend the following:   A little help with walking and/or transfers;A little help with bathing/dressing/bathroom;Help with stairs or ramp for entrance;Assist for transportation     Functional Status  Assessment   Patient has had a recent decline in their functional status and demonstrates the ability to make significant improvements in function in a reasonable and predictable amount of time.     Equipment Recommendations   None recommended by OT (patient reports he has all needed DME)      Precautions/Restrictions   Precautions Precautions: Fall Precaution/Restrictions Comments: foley, O2 sats Restrictions Weight Bearing Restrictions Per Provider Order: No     Mobility Bed Mobility Overal bed mobility: Modified Independent             General bed mobility comments: has hospital bed at home    Transfers Overall transfer level: Needs assistance Equipment used: Rolling walker (2 wheels) Transfers: Sit to/from Stand, Bed to chair/wheelchair/BSC Sit to Stand: Supervision, Contact guard assist     Step pivot transfers: Supervision, Contact guard assist     General transfer comment: initial steadying required, O2 and Foley management assist, amb to sink side in bathroom, to chair across room and sats remained 100% on 3 ltrs [place]      Balance Overall balance assessment: Needs assistance Sitting-balance support: Feet supported Sitting balance-[name] Scale: Good     Standing balance support: Reliant on assistive device for balance, During functional activity, Bilateral upper extremity supported Standing balance-[name] Scale: Poor Standing balance comment: no LOB with typical UE support of RW                           ADL either performed or assessed with clinical judgement   ADL Overall ADL's : Needs assistance/impaired Eating/Feeding: Independent;Sitting   Grooming: Wash/dry hands;Wash/dry face;Oral care;Modified  independent;Sitting   Upper Body Bathing: Modified independent;Sitting   Lower Body Bathing: Contact guard assist;Sitting/lateral leans   Upper Body Dressing : Modified independent;Sitting   Lower Body Dressing: Minimal assistance;Sit  to/from stand Lower Body Dressing Details (indicate cue type and reason): assist at baseline for Foley management with clothing and socks Toilet Transfer: Supervision/safety;Rolling walker (2 wheels) Toilet Transfer Details (indicate cue type and reason): Folay at baseline for voiding, toilet riser at home for transfer Toileting- Clothing Manipulation and Hygiene: Contact guard assist       Functional mobility during ADLs: Supervision/safety;Contact guard assist;Rolling walker (2 wheels) (in room, bathroom and sink side) General ADL Comments: support for O2 and Foley tubing management, min cues for pacing and breathing     Vision Baseline Vision/History: 1 Wears glasses;0 No visual deficits Patient Visual Report: No change from baseline Vision Assessment?: No apparent visual deficits;Wears glasses for reading            Pertinent Vitals/Pain Pain Assessment Pain Assessment: No/denies pain     Extremity/Trunk Assessment Upper Extremity Assessment Upper Extremity Assessment: Left hand dominant   Lower Extremity Assessment Lower Extremity Assessment: Overall WFL for tasks assessed   Cervical / Trunk Assessment Cervical / Trunk Assessment: Normal;Other exceptions (increased body habitus)   Communication Communication Communication: Impaired Factors Affecting Communication: Hearing impaired   Cognition Arousal: Alert Behavior During Therapy: WFL for tasks assessed/performed Cognition: No apparent impairments             OT - Cognition Comments: patient reports occasional STM deficits but nothing atypical nor noted this session                 Following commands: Intact       Cueing  General Comments   Cueing Techniques: Verbal cues  SpO2 100% on 3 ltrs O2 in room amb, has bleeding around Foley, nursing and NT aware, as well as sacral dressing and LE discoloration           Home Living Family/patient expects to be discharged to:: Private  residence Living Arrangements: Children Available Help at Discharge: Family;Available 24 hours/day Type of Home: House Home Access: Ramped entrance     Home Layout: One level;Able to live on main level with bedroom/bathroom;Other (Comment) (basement with grandson apt)     Bathroom Shower/Tub: Chief Strategy Officer: Standard     Home Equipment: Toilet riser;Tub bench;Rollator (4 wheels);Wheelchair - Careers Adviser (comment);Adaptive equipment Adaptive Equipment: Reacher Additional Comments: Pt uses 4WW as primary AD and church visits in w/c with family assist      Prior Functioning/Environment Prior Level of Function : Needs assist       Physical Assist : Mobility (physical);ADLs (physical) Mobility (physical): Gait   Mobility Comments: rollator in home with mod I, assist for community mob in w/c; 4 ltrs O2 at baseline and Cpap at night ADLs Comments: mod I with BADL's, has assist with IADL's    OT Problem List: Decreased activity tolerance;Impaired balance (sitting and/or standing);Cardiopulmonary status limiting activity;Obesity    AM-PAC OT 6 Clicks Daily Activity     Outcome Measure Help from another person eating meals?: None Help from another person taking care of personal grooming?: None Help from another person toileting, which includes using toliet, bedpan, or urinal?: A Little Help from another person bathing (including washing, rinsing, drying)?: A Little Help from another person to put on and taking off regular upper body clothing?: None Help from another person to put on and taking off  regular lower body clothing?: A Little 6 Click Score: 21   End of Session Equipment Utilized During Treatment: Gait belt;Rolling walker (2 wheels);Oxygen  Nurse Communication: Mobility status;Other (comment) (OT assisted to order lunch)  Activity Tolerance: Patient tolerated treatment well Patient left: in bed;with call bell/phone within reach;with bed alarm set  (was just in chair all am upon OT arrival)  OT Visit Diagnosis: Unsteadiness on feet (R26.81)                Time: 8885-8797 OT Time Calculation (min): 48 min Charges:  OT General Charges $OT Visit: 1 Visit OT Evaluation $OT Eval Low Complexity: 1 Low OT Treatments $Self Care/Home Management : 8-22 mins Briselda Naval OT/L Acute Rehabilitation Department  [phone]  06/04/2024, 12:13 PM

## 2024-06-04 NOTE — Discharge Instructions (Addendum)
 Please continue to take your antibiotic as instructed until it has been completed.  You can restart eliquis  on 10/30 as long as your urine appears to remain free of blood.  Follow up with urology in about 2 weeks

## 2024-06-04 NOTE — Discharge Summary (Signed)
 Physician Discharge Summary  Patient: [name] [name] DOB: Dec 06, 1945   Code Status: Full Code Admit date: 06/02/2024 Discharge date: 06/04/2024 Disposition: Home, No home health services recommended PCP: Norleen Lynwood ORN, MD  Recommendations for Outpatient Follow-up:  Follow up with PCP within 1-2 weeks Regarding general hospital follow up and preventative care Follow up with urology   Discharge Diagnoses:  Principal Problem:   Gross hematuria Active Problems:   Acute cystitis   Acute kidney injury superimposed on chronic kidney disease   Essential hypertension   BPH (benign prostatic hyperplasia)   Obstructive sleep apnea   Non-insulin  dependent type 2 diabetes mellitus (HCC)   Pulmonary hypertension (HCC)   Chronic hypoxic respiratory failure (HCC)   Chronic indwelling Foley catheter   Hyperkalemia   Paroxysmal atrial fibrillation (HCC)   History of gout   Hypoglycemia   MCI (mild cognitive impairment)   Sacral ulcer, limited to breakdown of skin [facility])  Brief Hospital Course Summary: [name] [name] is a 78 y.o. male with medical history significant of A-fib on Eliquis , HFpEF, pulmonary hypertension, CKD stage IIIb, type 2 diabetes, hypertension, chronic hypoxic respiratory failure 2 to 3 L oxygen  at bedtime, OSA on BiPAP at bedtime, hyperlipidemia, OSA/OHS, gout and BPH with chronic indwelling Foley catheter is admitted with complaints of gross hematuria and UTI.  Foley was exchanged on admission. Patient was seen by urology who irrigated the Foley.  Eliquis  was held. He was started on IV Abx for UTI.  Over the course of 2 days, his gross hematuria resolved.  I discussed timing of restarting eliquis  with patient and daughter and we will plan to restart at the end of his treatment of his cystitis. If he has repeat hematuria, with restarting, would probably recommend discontinuing the eliquis  indefinitely.   All other chronic conditions were treated with home medications.     Discharge Condition: Good, improved Recommended discharge diet: Regular healthy diet  Consultations: Urology   Procedures/Studies: Bladder irrigation   Discharge Instructions     Discharge patient   Complete by: As directed    Discharge disposition: 01-Home or Self Care   Discharge patient date: 06/04/2024      Allergies as of 06/04/2024       Reactions   Codeine Other (See Comments)   Altered mental status Confusion    Heparin  Rash    Abdominal rash 01/2013        Medication List     PAUSE taking these medications    Eliquis  5 MG Tabs tablet Wait to take this until your doctor or other care provider tells you to start again. Generic drug: apixaban  TAKE 1 TABLET TWICE A DAY   torsemide  20 MG tablet Wait to take this until your doctor or other care provider tells you to start again. Commonly known as: DEMADEX  TAKE 1 TABLET(20 MG) BY MOUTH DAILY What changed: See the new instructions.       TAKE these medications    acetaminophen  500 MG tablet Commonly known as: TYLENOL  Take 500-1,000 mg by mouth every 6 (six) hours as needed for moderate pain (pain score 4-6).   allopurinol  100 MG tablet Commonly known as: ZYLOPRIM  Take 1 tablet (100 mg total) by mouth daily.   ammonium lactate 12 % lotion Commonly known as: LAC-HYDRIN Apply 1 Application topically as needed for dry skin.   atorvastatin  20 MG tablet Commonly known as: LIPITOR Take 1 tablet (20 mg total) by mouth daily. What changed: when to take this  cephALEXin  500 MG capsule Commonly known as: KEFLEX  Take 1 capsule (500 mg total) by mouth 3 (three) times daily for 4 days.   cetirizine 10 MG tablet Commonly known as: ZYRTEC Take 10 mg by mouth at bedtime as needed for allergies (cough).   cyanocobalamin  1000 MCG/ML injection Commonly known as: VITAMIN B12 Inject 1 mL (1,000 mcg total) into the muscle every 30 (thirty) days.   donepezil  5 MG tablet Commonly known as: ARICEPT  Take 1  tablet (5 mg total) by mouth at bedtime.   glipiZIDE  10 MG 24 hr tablet Commonly known as: GLUCOTROL  XL TAKE 1 TABLET(10 MG) BY MOUTH DAILY WITH BREAKFAST   metoprolol  tartrate 25 MG tablet Commonly known as: LOPRESSOR  TAKE 1 TABLET(25 MG) BY MOUTH TWICE DAILY   nystatin  cream Commonly known as: MYCOSTATIN  Apply 1 Application topically 2 (two) times daily. What changed: Another medication with the same name was changed. Make sure you understand how and when to take each.   nystatin  powder Commonly known as: MYCOSTATIN /NYSTOP  Apply 1 Application topically 2 (two) times daily. What changed:  when to take this reasons to take this   OXYGEN  Inhale 3 L/min into the lungs continuous.   polyethylene glycol 17 g packet Commonly known as: MIRALAX  / GLYCOLAX  Take 17 g by mouth daily as needed.        Follow-up Information     [facility]. Schedule an appointment as soon as possible for a visit in 2 week(s).   Contact information: [address] [place] [address] [place]  [address] [phone]                Subjective   Pt reports feeling well. Denies discomfort with his catheter or bladder. Feels up to going home today.   All questions and concerns were addressed at time of discharge.  Objective  Blood pressure 136/67, pulse 69, temperature 97.8 F (36.6 C), temperature source Oral, resp. rate 17, height 5' 8 (1.727 m), weight 115.4 kg, SpO2 97%.   General: Pt is alert, awake, not in acute distress Cardiovascular: RRR, S1/S2 +, no rubs, no gallops Respiratory: CTA bilaterally, no wheezing, no rhonchi Abdominal: Soft, NT, ND, bowel sounds + Extremities: no edema, no cyanosis  The results of significant diagnostics from this hospitalization (including imaging, microbiology, ancillary and laboratory) are listed below for reference.   Imaging studies: CT ABDOMEN PELVIS WO CONTRAST Result Date: 06/02/2024 EXAM: CT ABDOMEN AND PELVIS WITHOUT  CONTRAST 06/02/2024 06:52:18 PM TECHNIQUE: CT of the abdomen and pelvis was performed without the administration of intravenous contrast. Multiplanar reformatted images are provided for review. Automated exposure control, iterative reconstruction, and/or weight-based adjustment of the mA/kV was utilized to reduce the radiation dose to as low as reasonably achievable. COMPARISON: 11/22/2023 CLINICAL HISTORY: Abdominal pain, acute, nonlocalized; gross hematuria. FINDINGS: LOWER CHEST: No acute abnormality. LIVER: Nodular hepatic contour. Subcentimeter inferior right hepatic cyst (image 33), benign. GALLBLADDER AND BILE DUCTS: Suspected layering tiny gallstones (image 32). No biliary ductal dilatation. SPLEEN: No acute abnormality. PANCREAS: No acute abnormality. ADRENAL GLANDS: 10 mm fat density lesion in the left adrenal gland (image 23), compatible with a benign adrenal myelolipoma. Additional low density nodularity in the bilateral adrenal glands, unchanged, compatible with benign adrenal adenomas. KIDNEYS, URETERS AND BLADDER: Simple left renal cyst measuring up to 2.6 cm (image 27), benign. No follow-up is recommended. No stones in the kidneys or ureters. No hydronephrosis. No perinephric or periureteral stranding. Urinary bladder is decompressed by an indwelling Foley catheter.  Mild perivesical stranding raising concern for cystitis, although similar to the prior/chronic. GI AND BOWEL: Stomach demonstrates no acute abnormality. There is no bowel obstruction. Prior appendectomy. PERITONEUM AND RETROPERITONEUM: No ascites. No free air. VASCULATURE: Atherosclerotic calcifications of the abdominal aorta and branch vessels, although patent. LYMPH NODES: No lymphadenopathy. REPRODUCTIVE ORGANS: Mild prostatomegaly. BONES AND SOFT TISSUES: No acute osseous abnormality. No focal soft tissue abnormality. IMPRESSION: 1. Mild perivesical stranding, raising concern for cystitis. Bladder decompressed by an indwelling Foley  catheter. 2. Suspected layering tiny gallstones, without associated inflammatory changes. 3. Additional stable ancillary findings, as above. Electronically signed by: Pinkie Pebbles MD 06/02/2024 07:08 PM EDT RP Workstation: [id number]    Labs: Basic Metabolic Panel: Recent Labs  Lab 06/02/24 1703 06/02/24 2312 06/03/24 0440 06/04/24 0349  NA 139 142 142 140  K 5.7* 4.4 4.7 5.8*  CL 105 106 107 107  CO2 24 29 28 28   GLUCOSE 64* 99 63* 120*  BUN 36* 36* 33* 28*  CREATININE 2.11* 1.96* 1.80* 1.65*  CALCIUM  9.7 9.5 9.5 9.4   CBC: Recent Labs  Lab 06/02/24 1703 06/03/24 0440 06/04/24 0349  WBC 13.1* 10.6* 10.8*  NEUTROABS 8.6*  --   --   HGB 13.4 11.5* 11.8*  HCT 42.2 38.8* 39.6  MCV 92.5 95.1 96.6  PLT 342 323 329   Microbiology: Results for orders placed or performed during the hospital encounter of 11/21/23  Blood Culture (routine x 2)     Status: Abnormal   Collection Time: 11/21/23  8:32 PM   Specimen: BLOOD RIGHT HAND  Result Value Ref Range Status   Specimen Description   Final    BLOOD RIGHT HAND Performed at [facility], [address], [place], [place] [address]    Special Requests   Final    BOTTLES DRAWN AEROBIC AND ANAEROBIC Blood Culture adequate volume Performed at [facility], [address], [place], [place] [address]    Culture  Setup Time   Final    GRAM NEGATIVE RODS IN BOTH AEROBIC AND ANAEROBIC BOTTLES CRITICAL RESULT CALLED TO, READ BACK BY AND VERIFIED WITH: PHARMD DREW WOFFORD ON 11/22/23 @ 1932 BY DRT Performed at [facility], [address], [place], [place] [address]    Culture PROTEUS MIRABILIS (A)  Final   Report Status 11/24/2023 FINAL  Final   Organism ID, Bacteria PROTEUS MIRABILIS  Final   Organism ID, Bacteria PROTEUS MIRABILIS  Final      Susceptibility   Proteus mirabilis - KIRBY BAUER*    CEFAZOLIN  INTERMEDIATE Intermediate    Proteus mirabilis - MIC*    AMPICILLIN <=2  SENSITIVE Sensitive     CEFEPIME  <=0.12 SENSITIVE Sensitive     CEFTAZIDIME <=1 SENSITIVE Sensitive     CEFTRIAXONE  <=0.25 SENSITIVE Sensitive     CIPROFLOXACIN  <=0.25 SENSITIVE Sensitive     GENTAMICIN <=1 SENSITIVE Sensitive     IMIPENEM 4 SENSITIVE Sensitive     TRIMETH /SULFA  <=20 SENSITIVE Sensitive     AMPICILLIN/SULBACTAM <=2 SENSITIVE Sensitive     PIP/TAZO <=4 SENSITIVE Sensitive ug/mL    * PROTEUS MIRABILIS    PROTEUS MIRABILIS  Blood Culture ID Panel (Reflexed)     Status: Abnormal   Collection Time: 11/21/23  8:32 PM  Result Value Ref Range Status   Enterococcus faecalis NOT DETECTED NOT DETECTED Final   Enterococcus Faecium NOT DETECTED NOT DETECTED Final   Listeria monocytogenes NOT DETECTED NOT DETECTED Final   Staphylococcus species NOT DETECTED NOT DETECTED  Final   Staphylococcus aureus (BCID) NOT DETECTED NOT DETECTED Final   Staphylococcus epidermidis NOT DETECTED NOT DETECTED Final   Staphylococcus lugdunensis NOT DETECTED NOT DETECTED Final   Streptococcus species NOT DETECTED NOT DETECTED Final   Streptococcus agalactiae NOT DETECTED NOT DETECTED Final   Streptococcus pneumoniae NOT DETECTED NOT DETECTED Final   Streptococcus pyogenes NOT DETECTED NOT DETECTED Final   A.calcoaceticus-baumannii NOT DETECTED NOT DETECTED Final   Bacteroides fragilis NOT DETECTED NOT DETECTED Final   Enterobacterales DETECTED (A) NOT DETECTED Final    Comment: Enterobacterales represent a large order of gram negative bacteria, not a single organism. CRITICAL RESULT CALLED TO, READ BACK BY AND VERIFIED WITH: PHARMD DREW WOFFORD ON 11/22/23 @ 1932 BY DRT    Enterobacter cloacae complex NOT DETECTED NOT DETECTED Final   Escherichia coli NOT DETECTED NOT DETECTED Final   Klebsiella aerogenes NOT DETECTED NOT DETECTED Final   Klebsiella oxytoca NOT DETECTED NOT DETECTED Final   Klebsiella pneumoniae NOT DETECTED NOT DETECTED Final   Proteus species DETECTED (A) NOT DETECTED Final     Comment: CRITICAL RESULT CALLED TO, READ BACK BY AND VERIFIED WITH: PHARMD DREW WOFFORD ON 11/22/23 @ 1932 BY DRT    Salmonella species NOT DETECTED NOT DETECTED Final   Serratia marcescens NOT DETECTED NOT DETECTED Final   Haemophilus influenzae NOT DETECTED NOT DETECTED Final   Neisseria meningitidis NOT DETECTED NOT DETECTED Final   Pseudomonas aeruginosa NOT DETECTED NOT DETECTED Final   Stenotrophomonas maltophilia NOT DETECTED NOT DETECTED Final   Candida albicans NOT DETECTED NOT DETECTED Final   Candida auris NOT DETECTED NOT DETECTED Final   Candida glabrata NOT DETECTED NOT DETECTED Final   Candida krusei NOT DETECTED NOT DETECTED Final   Candida parapsilosis NOT DETECTED NOT DETECTED Final   Candida tropicalis NOT DETECTED NOT DETECTED Final   Cryptococcus neoformans/gattii NOT DETECTED NOT DETECTED Final   CTX-M ESBL NOT DETECTED NOT DETECTED Final   Carbapenem resistance IMP NOT DETECTED NOT DETECTED Final   Carbapenem resistance KPC NOT DETECTED NOT DETECTED Final   Carbapenem resistance NDM NOT DETECTED NOT DETECTED Final   Carbapenem resist OXA 48 LIKE NOT DETECTED NOT DETECTED Final   Carbapenem resistance VIM NOT DETECTED NOT DETECTED Final    Comment: Performed at [facility], [address] [place], [place] [address]  Resp panel by RT-PCR (RSV, Flu A&B, Covid) Peripheral     Status: None   Collection Time: 11/21/23  8:33 PM   Specimen: Peripheral; Nasal Swab  Result Value Ref Range Status   SARS Coronavirus 2 by RT PCR NEGATIVE NEGATIVE Final    Comment: (NOTE) SARS-CoV-2 target nucleic acids are NOT DETECTED.  The SARS-CoV-2 RNA is generally detectable in upper respiratory specimens during the acute phase of infection. The lowest concentration of SARS-CoV-2 viral copies this assay can detect is 138 copies/mL. A negative result does not preclude SARS-Cov-2 infection and should not be used as the sole basis for treatment or other patient management  decisions. A negative result may occur with  improper specimen collection/handling, submission of specimen other than nasopharyngeal swab, presence of viral mutation(s) within the areas targeted by this assay, and inadequate number of viral copies(<138 copies/mL). A negative result must be combined with clinical observations, patient history, and epidemiological information. The expected result is Negative.  Fact Sheet for Patients:  bloggercourse.com  Fact Sheet for Healthcare Providers:  seriousbroker.it  This test is no t yet approved or cleared by the [place]   FDA and  has been authorized for detection and/or diagnosis of SARS-CoV-2 by FDA under an Emergency Use Authorization (EUA). This EUA will remain  in effect (meaning this test can be used) for the duration of the COVID-19 declaration under Section 564(b)(1) of the Act, 21 U.S.C.section 360bbb-3(b)(1), unless the authorization is terminated  or revoked sooner.       Influenza A by PCR NEGATIVE NEGATIVE Final   Influenza B by PCR NEGATIVE NEGATIVE Final    Comment: (NOTE) The Xpert Xpress SARS-CoV-2/FLU/RSV plus assay is intended as an aid in the diagnosis of influenza from Nasopharyngeal swab specimens and should not be used as a sole basis for treatment. Nasal washings and aspirates are unacceptable for Xpert Xpress SARS-CoV-2/FLU/RSV testing.  Fact Sheet for Patients: bloggercourse.com  Fact Sheet for Healthcare Providers: seriousbroker.it  This test is not yet approved or cleared by the [place]  FDA and has been authorized for detection and/or diagnosis of SARS-CoV-2 by FDA under an Emergency Use Authorization (EUA). This EUA will remain in effect (meaning this test can be used) for the duration of the COVID-19 declaration under Section 564(b)(1) of the Act, 21 U.S.C. section 360bbb-3(b)(1), unless the  authorization is terminated or revoked.     Resp Syncytial Virus by PCR NEGATIVE NEGATIVE Final    Comment: (NOTE) Fact Sheet for Patients: bloggercourse.com  Fact Sheet for Healthcare Providers: seriousbroker.it  This test is not yet approved or cleared by the [place]  FDA and has been authorized for detection and/or diagnosis of SARS-CoV-2 by FDA under an Emergency Use Authorization (EUA). This EUA will remain in effect (meaning this test can be used) for the duration of the COVID-19 declaration under Section 564(b)(1) of the Act, 21 U.S.C. section 360bbb-3(b)(1), unless the authorization is terminated or revoked.  Performed at [facility], [address] [place], [place] [address]   Urine Culture     Status: Abnormal   Collection Time: 11/21/23  8:33 PM   Specimen: Urine, Random  Result Value Ref Range Status   Specimen Description   Final    URINE, RANDOM Performed at [facility], [address], [place], [place] [address]    Special Requests   Final    NONE Reflexed from [id number] Performed at [facility], [address] [place], [place] [address]    Culture 80,000 COLONIES/mL PROTEUS MIRABILIS (A)  Final   Report Status 11/24/2023 FINAL  Final   Organism ID, Bacteria PROTEUS MIRABILIS (A)  Final      Susceptibility   Proteus mirabilis - MIC*    AMPICILLIN <=2 SENSITIVE Sensitive     CEFAZOLIN  <=4 SENSITIVE Sensitive     CEFEPIME  <=0.12 SENSITIVE Sensitive     CEFTRIAXONE  <=0.25 SENSITIVE Sensitive     CIPROFLOXACIN  <=0.25 SENSITIVE Sensitive     GENTAMICIN <=1 SENSITIVE Sensitive     IMIPENEM 2 SENSITIVE Sensitive     NITROFURANTOIN 128 RESISTANT Resistant     TRIMETH /SULFA  <=20 SENSITIVE Sensitive     AMPICILLIN/SULBACTAM <=2 SENSITIVE Sensitive     PIP/TAZO <=4 SENSITIVE Sensitive ug/mL    * 80,000 COLONIES/mL PROTEUS MIRABILIS  Blood Culture  (routine x 2)     Status: Abnormal   Collection Time: 11/21/23  9:10 PM   Specimen: BLOOD LEFT ARM  Result Value Ref Range Status   Specimen Description   Final    BLOOD LEFT ARM Performed at [facility], [address] [place], [place] [address]    Special Requests  Final    BOTTLES DRAWN AEROBIC AND ANAEROBIC Blood Culture results may not be optimal due to an inadequate volume of blood received in culture bottles Performed at [facility], [address] [place], [place] [address]    Culture  Setup Time   Final    GRAM NEGATIVE RODS AEROBIC BOTTLE ONLY CRITICAL VALUE NOTED.  VALUE IS CONSISTENT WITH PREVIOUSLY REPORTED AND CALLED VALUE.    Culture (A)  Final    PROTEUS MIRABILIS SUSCEPTIBILITIES PERFORMED ON PREVIOUS CULTURE WITHIN THE LAST 5 DAYS. Performed at [facility], [address] [place], [place] [address]    Report Status 11/24/2023 FINAL  Final    Time coordinating discharge: Over 30 minutes  Marien LITTIE Piety, MD  [facility] 06/04/2024, 10:48 AM

## 2024-06-05 ENCOUNTER — Telehealth: Payer: Self-pay

## 2024-06-05 NOTE — Transitions of Care (Post Inpatient/ED Visit) (Unsigned)
   06/05/2024  Name: CADE DASHNER MRN: [id number] DOB: December 06, 1945  Today's TOC FU Call Status: Today's TOC FU Call Status:: Unsuccessful Call (1st Attempt) Unsuccessful Call (1st Attempt) Date: 06/05/24  Attempted to reach the patient regarding the most recent Inpatient/ED visit.  Follow Up Plan: Additional outreach attempts will be made to reach the patient to complete the Transitions of Care (Post Inpatient/ED visit) call.   Signature Julian Lemmings, LPN [facility] Nurse Health Advisor Direct Dial [phone]

## 2024-06-06 NOTE — Transitions of Care (Post Inpatient/ED Visit) (Unsigned)
   06/06/2024  Name: [name] [name] MRN: [id number] DOB: [birth year]  Today's TOC FU Call Status: Today's TOC FU Call Status:: Unsuccessful Call (2nd Attempt) Unsuccessful Call (1st Attempt) Date: 06/05/24 Unsuccessful Call (2nd Attempt) Date: 06/06/24  Attempted to reach the patient regarding the most recent Inpatient/ED visit.  Follow Up Plan: Additional outreach attempts will be made to reach the patient to complete the Transitions of Care (Post Inpatient/ED visit) call.   Signature Julian Lemmings, LPN [facility] Nurse Health Advisor Direct Dial [phone]

## 2024-06-07 NOTE — Transitions of Care (Post Inpatient/ED Visit) (Signed)
 06/07/2024  Name: [name] [name] MRN: [id number] DOB: Jun 16, 1946  Today's TOC FU Call Status: Today's TOC FU Call Status:: Successful TOC FU Call Completed Unsuccessful Call (1st Attempt) Date: 06/05/24 Unsuccessful Call (2nd Attempt) Date: 06/06/24 [facility] FU Call Complete Date: 06/07/24 Patient's Name and Date of Birth confirmed.  Transition Care Management Follow-up Telephone Call Date of Discharge: 06/04/24 Discharge Facility: [facility]) Type of Discharge: Inpatient Admission Primary Inpatient Discharge Diagnosis:: cystitis How have you been since you were released from the hospital?: Better Any questions or concerns?: No  Items Reviewed: Did you receive and understand the discharge instructions provided?: Yes Medications obtained,verified, and reconciled?: Yes (Medications Reviewed) Any new allergies since your discharge?: No Dietary orders reviewed?: Yes Do you have support at home?: Yes People in Home [RPT]: child(ren), adult  Medications Reviewed Today: Medications Reviewed Today     Reviewed by Emmitt Pan, LPN (Licensed Practical Nurse) on 06/07/24 at 1125  Med List Status: <None>   Medication Order Taking? Sig Documenting Provider Last Dose Status Informant  acetaminophen  (TYLENOL ) 500 MG tablet [id number] Yes Take 500-1,000 mg by mouth every 6 (six) hours as needed for moderate pain (pain score 4-6). [provider]  Active Pharmacy Records, Child  allopurinol  (ZYLOPRIM ) 100 MG tablet [id number] Yes Take 1 tablet (100 mg total) by mouth daily. Norleen Lynwood ORN, MD  Active Pharmacy Records, Child  ammonium lactate (LAC-HYDRIN) 12 % lotion [id number] Yes Apply 1 Application topically as needed for dry skin. [provider]  Active Pharmacy Records, Child           Med Note (COFFELL, JON CHRISTELLA Repress Jun 03, 2024 12:19 PM)    atorvastatin  (LIPITOR) 20 MG tablet [id number] Yes Take 1 tablet (20 mg total) by mouth daily.  Patient taking differently: Take 20  mg by mouth at bedtime.   Norleen Lynwood ORN, MD  Active Pharmacy Records, Child  cephALEXin  (KEFLEX ) 500 MG capsule [id number] Yes Take 1 capsule (500 mg total) by mouth 3 (three) times daily for 4 days. Lenon Marien CROME, MD  Active   cetirizine (ZYRTEC) 10 MG tablet [id number] Yes Take 10 mg by mouth at bedtime as needed for allergies (cough). [provider]  Active Pharmacy Records, Child  cyanocobalamin  (VITAMIN B12) 1000 MCG/ML injection [id number] Yes Inject 1 mL (1,000 mcg total) into the muscle every 30 (thirty) days. Norleen Lynwood ORN, MD  Active Pharmacy Records, Child  donepezil  (ARICEPT ) 5 MG tablet [id number] Yes Take 1 tablet (5 mg total) by mouth at bedtime. Norleen Lynwood ORN, MD  Active Pharmacy Records, Child  ELIQUIS  5 MG TABS tablet [id number]  TAKE 1 TABLET TWICE A DAY  Patient not taking: Reported on 06/07/2024   Pietro Redell RAMAN, MD  Active Pharmacy Records, Child  glipiZIDE  (GLUCOTROL  XL) 10 MG 24 hr tablet [id number] Yes TAKE 1 TABLET(10 MG) BY MOUTH DAILY WITH BREAKFAST John, James W, MD  Active Pharmacy Records, Child  metoprolol  tartrate (LOPRESSOR ) 25 MG tablet [id number] Yes TAKE 1 TABLET(25 MG) BY MOUTH TWICE DAILY Norleen Lynwood ORN, MD  Active Pharmacy Records, Child  nystatin  (MYCOSTATIN /NYSTOP ) powder [id number] Yes Apply 1 Application topically 2 (two) times daily.  Patient taking differently: Apply 1 Application topically 2 (two) times daily as needed (skin irritation).   Stoneking, Adine PARAS., MD  Active Pharmacy Records, Child  nystatin  cream (MYCOSTATIN ) [id number] Yes Apply 1 Application topically 2 (two) times daily. Stoneking, Adine PARAS., MD  Active Pharmacy Records, Child  OXYGEN  [id number] Yes  Inhale 3 L/min into the lungs continuous. [provider]  Active Pharmacy Records, Child           Med Note (COFFELL, JON CHRISTELLA Repress Jun 03, 2024 12:18 PM)    polyethylene glycol (MIRALAX  / GLYCOLAX ) 17 g packet [id number] Yes Take 17 g by mouth daily as needed. Fairy Frames, MD  Active Pharmacy Records, Child           Med Note (COFFELL, JON CHRISTELLA   Sun Jun 03, 2024 12:18 PM)    torsemide  (DEMADEX ) 20 MG tablet [id number]  TAKE 1 TABLET(20 MG) BY MOUTH DAILY  Patient not taking: Reported on 06/07/2024   Pietro Redell RAMAN, MD  Active             Home Care and Equipment/Supplies: Were Home Health Services Ordered?: NA Any new equipment or medical supplies ordered?: NA  Functional Questionnaire: Do you need assistance with bathing/showering or dressing?: No Do you need assistance with meal preparation?: No Do you need assistance with eating?: No Do you have difficulty maintaining continence: No Do you need assistance with getting out of bed/getting out of a chair/moving?: No Do you have difficulty managing or taking your medications?: No  Follow up appointments reviewed: PCP Follow-up appointment confirmed?: Yes Date of PCP follow-up appointment?: 06/19/24 Follow-up Provider: [facility] Follow-up appointment confirmed?: NA Do you need transportation to your follow-up appointment?: No Do you understand care options if your condition(s) worsen?: Yes-patient verbalized understanding    SIGNATURE Julian Lemmings, LPN [facility] Nurse Health Advisor Direct Dial [phone]

## 2024-06-19 ENCOUNTER — Ambulatory Visit: Admitting: Internal Medicine

## 2024-06-19 ENCOUNTER — Encounter: Payer: Self-pay | Admitting: Internal Medicine

## 2024-06-19 ENCOUNTER — Other Ambulatory Visit: Payer: Self-pay

## 2024-06-19 VITALS — BP 136/80 | HR 81 | Temp 99.0°F | Ht 68.0 in | Wt 260.0 lb

## 2024-06-19 DIAGNOSIS — Z7901 Long term (current) use of anticoagulants: Secondary | ICD-10-CM | POA: Diagnosis not present

## 2024-06-19 DIAGNOSIS — I5032 Chronic diastolic (congestive) heart failure: Secondary | ICD-10-CM | POA: Diagnosis not present

## 2024-06-19 DIAGNOSIS — N3001 Acute cystitis with hematuria: Secondary | ICD-10-CM

## 2024-06-19 DIAGNOSIS — Z87448 Personal history of other diseases of urinary system: Secondary | ICD-10-CM

## 2024-06-19 DIAGNOSIS — I10 Essential (primary) hypertension: Secondary | ICD-10-CM

## 2024-06-19 DIAGNOSIS — Z23 Encounter for immunization: Secondary | ICD-10-CM

## 2024-06-19 NOTE — Assessment & Plan Note (Signed)
 D/w daughter, ok to restart the eliquis , and let us  know for any other bleeding

## 2024-06-19 NOTE — Progress Notes (Signed)
 Patient ID: [name] [name], male   DOB: [birth year], 78 y.o.   MRN: [id number]        Chief Complaint: follow up post hospn oct 25 - 27 with gross hematuria, uti       HPI:  [name] [name] is a 78 y.o. male here with daughter, overall doing well, was admitted with gross hematuria, tx with IV antibx and d/c with 4 days course cephalexin  which he tolerated well.  Eliquis  required to held, and meant to restart after the antibiotic tx per d/c summary, but daughter has not yet done this, and is holding the eliquis  and torsemide .  Foley exchanged on admission.  Urology consulted with bladder irrigation.  Otherwise did well, remains on home o2 3L without change.  At home he has done well, walks with walker, no falls, no further hematuria or other bleeding.  Has urology appt for f/u Dec 4.  No other new complaints Due for flu shot today.        Wt Readings from Last 3 Encounters:  06/19/24 260 lb (117.9 kg)  06/02/24 254 lb 6.6 oz (115.4 kg)  04/20/24 254 lb 6.4 oz (115.4 kg)   BP Readings from Last 3 Encounters:  06/19/24 136/80  06/04/24 120/65  04/20/24 110/74         Past Medical History:  Diagnosis Date   Acute right-sided CHF (congestive heart failure) (HCC)    a. 01/2013.   Arthritis    knees and hands; thoracic area of the spine (01/05/2013)   BRONCHITIS, CHRONIC 01/02/2009   Chronic kidney disease    COMMON MIGRAINE    none since treating for high BP   Complication of anesthesia    aspiration pneumonia after hand OR (01/05/2013)   DIABETES MELLITUS, TYPE II    GOUT    HYPERLIPIDEMIA    HYPERTENSION    Long term (current) use of anticoagulants    Morbid obesity (HCC)    OBESITY HYPOVENTILATION SYNDROME    a. on home O2.   On home oxygen  therapy    OSA (obstructive sleep apnea)    Oxygen  deficiency    Permanent atrial fibrillation (HCC)    PROSTATE SPECIFIC ANTIGEN, ELEVATED 06/09/2007   Pulmonary HTN (HCC)    a. multifactorial including obstructive sleep apnea, obesity  hypoventilation syndrome and possible pulmonary venous hypertension.   SKIN RASH 03/12/2010   Sleep apnea    Unspecified hearing loss    Past Surgical History:  Procedure Laterality Date   APPENDECTOMY  08/09/1972   APPLICATION OF ROBOTIC ASSISTANCE FOR SPINAL PROCEDURE N/A 11/23/2022   Procedure: APPLICATION OF ROBOTIC ASSISTANCE FOR SPINAL PROCEDURE;  Surgeon: Lanis Pupa, MD;  Location: [facility] OR;  Service: Neurosurgery;  Laterality: N/A;   CARDIOVERSION  05/21/2008; ~ 06/2008   FRACTURE SURGERY  2024   HERNIA REPAIR     INGUINAL HERNIA REPAIR Right    RIGHT HEART CATHETERIZATION N/A 01/08/2013   Procedure: RIGHT HEART CATH;  Surgeon: Aleene JINNY Passe, MD;  Location: [facility] CATH LAB;  Service: Cardiovascular;  Laterality: N/A;   SPINE SURGERY     TENDON REPAIR Right 08/10/2007   lacerated his tendon and pulley Dr. Lorretta   UMBILICAL HERNIA REPAIR      reports that he quit smoking about 45 years ago. His smoking use included cigarettes. He started smoking about 60 years ago. He has a 15 pack-year smoking history. He has never used smokeless tobacco. He reports current alcohol use. He reports that he does not  use drugs. family history includes Alzheimer's disease in his father; Cancer in an other family member; Coronary artery disease in an other family member; Hearing loss in his brother; Obesity in his brother; Prostate cancer in his father; Stroke in his father. Allergies  Allergen Reactions   Codeine Other (See Comments)    Altered mental status Confusion    Heparin  Rash     Abdominal rash 01/2013   Current Outpatient Medications on File Prior to Visit  Medication Sig Dispense Refill   acetaminophen  (TYLENOL ) 500 MG tablet Take 500-1,000 mg by mouth every 6 (six) hours as needed for moderate pain (pain score 4-6).     allopurinol  (ZYLOPRIM ) 100 MG tablet Take 1 tablet (100 mg total) by mouth daily. 90 tablet 3   ammonium lactate (LAC-HYDRIN) 12 % lotion Apply 1 Application  topically as needed for dry skin.     atorvastatin  (LIPITOR) 20 MG tablet Take 1 tablet (20 mg total) by mouth daily. (Patient taking differently: Take 20 mg by mouth at bedtime.) 90 tablet 3   cetirizine (ZYRTEC) 10 MG tablet Take 10 mg by mouth at bedtime as needed for allergies (cough).     cyanocobalamin  (VITAMIN B12) 1000 MCG/ML injection Inject 1 mL (1,000 mcg total) into the muscle every 30 (thirty) days. 3 mL 3   donepezil  (ARICEPT ) 5 MG tablet Take 1 tablet (5 mg total) by mouth at bedtime. 90 tablet 3   [Paused] ELIQUIS  5 MG TABS tablet TAKE 1 TABLET TWICE A DAY (Patient not taking: Reported on 06/07/2024) 180 tablet 3   glipiZIDE  (GLUCOTROL  XL) 10 MG 24 hr tablet TAKE 1 TABLET(10 MG) BY MOUTH DAILY WITH BREAKFAST 90 tablet 3   metoprolol  tartrate (LOPRESSOR ) 25 MG tablet TAKE 1 TABLET(25 MG) BY MOUTH TWICE DAILY 180 tablet 3   nystatin  (MYCOSTATIN /NYSTOP ) powder Apply 1 Application topically 2 (two) times daily. (Patient taking differently: Apply 1 Application topically 2 (two) times daily as needed (skin irritation).) 60 g 3   nystatin  cream (MYCOSTATIN ) Apply 1 Application topically 2 (two) times daily. 30 g 4   OXYGEN  Inhale 3 L/min into the lungs continuous.     polyethylene glycol (MIRALAX  / GLYCOLAX ) 17 g packet Take 17 g by mouth daily as needed. 14 each 0   [Paused] torsemide  (DEMADEX ) 20 MG tablet TAKE 1 TABLET(20 MG) BY MOUTH DAILY (Patient not taking: Reported on 06/07/2024) 90 tablet 3   No current facility-administered medications on file prior to visit.        ROS:  All others reviewed and negative.  Objective        PE:  BP 136/80 (BP Location: Left Arm, Patient Position: Sitting, Cuff Size: Normal)   Pulse 81   Temp 99 F (37.2 C) (Oral)   Ht 5' 8 (1.727 m)   Wt 260 lb (117.9 kg)   SpO2 93%   BMI 39.53 kg/m                 Constitutional: Pt appears in NAD               HENT: Head: NCAT.                Right Ear: External ear normal.                 Left  Ear: External ear normal.                Eyes: . Pupils are equal, round, and reactive to  light. Conjunctivae and EOM are normal               Nose: without d/c or deformity               Neck: Neck supple. Gross normal ROM               Cardiovascular: Normal rate and regular rhythm.                 Pulmonary/Chest: Effort normal and breath sounds without rales or wheezing.                Abd:  Soft, NT, ND, + BS, no organomegaly               Neurological: Pt is alert. At baseline orientation, motor grossly intact               Skin: Skin is warm. No rashes, no other new lesions, LE edema - trace LE bilateral               Psychiatric: Pt behavior is normal without agitation   Micro: none  Cardiac tracings I have personally interpreted today:  none  Pertinent Radiological findings (summarize): none   Lab Results  Component Value Date   WBC 10.8 (H) 06/04/2024   HGB 11.8 (L) 06/04/2024   HCT 39.6 06/04/2024   PLT 329 06/04/2024   GLUCOSE 120 (H) 06/04/2024   CHOL 88 03/02/2024   TRIG 72.0 03/02/2024   HDL 37.10 (L) 03/02/2024   LDLCALC 36 03/02/2024   ALT 23 06/03/2024   AST 16 06/03/2024   NA 140 06/04/2024   K 5.8 (H) 06/04/2024   CL 107 06/04/2024   CREATININE 1.65 (H) 06/04/2024   BUN 28 (H) 06/04/2024   CO2 28 06/04/2024   TSH 1.31 03/02/2024   PSA 2.51 03/02/2024   INR 1.3 (H) 11/21/2023   HGBA1C 6.3 03/02/2024   MICROALBUR 5.3 (H) 03/02/2024   Assessment/Plan:  [name] [name] is a 78 y.o. White or Caucasian [1] male with  has a past medical history of Acute right-sided CHF (congestive heart failure) (HCC), Arthritis, BRONCHITIS, CHRONIC (01/02/2009), Chronic kidney disease, COMMON MIGRAINE, Complication of anesthesia, DIABETES MELLITUS, TYPE II, GOUT, HYPERLIPIDEMIA, HYPERTENSION, Long term (current) use of anticoagulants, Morbid obesity (HCC), OBESITY HYPOVENTILATION SYNDROME, On home oxygen  therapy, OSA (obstructive sleep apnea), Oxygen  deficiency, Permanent atrial  fibrillation (HCC), PROSTATE SPECIFIC ANTIGEN, ELEVATED (06/09/2007), Pulmonary HTN (HCC), SKIN RASH (03/12/2010), Sleep apnea, and Unspecified hearing loss.  Acute cystitis Now resolved post antibx, and no further bleeding, foley recently exchanged, and to f/u with urology dec 4 as planned  Chronic diastolic CHF (congestive heart failure) (HCC) Overall table, ok to restart the torsemide  20 mg qd  Long term (current) use of anticoagulants D/w daughter, ok to restart the eliquis , and let us  know for any other bleeding  Essential hypertension BP Readings from Last 3 Encounters:  06/19/24 136/80  06/04/24 120/65  04/20/24 110/74   Stable, pt to continue medical treatment lopressor  25 bid  Followup: Return in about 6 months (around 12/17/2024).  [name] Rush, MD 06/19/2024 12:58 PM  [facility] [place] [facility] - [facility]

## 2024-06-19 NOTE — Assessment & Plan Note (Signed)
 Now resolved post antibx, and no further bleeding, foley recently exchanged, and to f/u with urology dec 4 as planned

## 2024-06-19 NOTE — Patient Instructions (Signed)
 Ok to restart the Eliquis  and the Torsemide  as before  Please continue all other medications as before, and refills have been done if requested.  Please remember to call for your yearly eye exam.  Please keep your appointments with your specialists as you may have planned - urology Dec 4  We can hold on lab testing today  Please make an Appointment to return in 6 months, or sooner if needed

## 2024-06-19 NOTE — Assessment & Plan Note (Signed)
 BP Readings from Last 3 Encounters:  06/19/24 136/80  06/04/24 120/65  04/20/24 110/74   Stable, pt to continue medical treatment lopressor  25 bid

## 2024-06-19 NOTE — Patient Instructions (Signed)
 [name] [name]

## 2024-06-19 NOTE — Assessment & Plan Note (Signed)
 Overall table, ok to restart the torsemide  20 mg qd

## 2024-06-19 NOTE — Patient Outreach (Signed)
 Complex Care Management   Visit Note  06/19/2024  Name:  TRESHAUN CARRICO MRN: [id number] DOB: [birth year]  Situation: RNCM reached out regarding complex care management program, to assess if patient has any care management needs. Spoke with daughter Nathanel Fries, manages patient health/dpr. Patient very HOH. Ms. Fries reports patient just saw PCP today for follow up post hospitalization. She reports PCP has gone over all medications with them. Per Ms. McHone, patient is doing well. Declines participation in care management program. RNCM retierated LCSW, community care resource guides and nursing education/support services.   Recommendation:   Encouraged to follow up with PCP if care management services needed in the future.  Follow Up Plan:   No further outreaches will be made at this time  Heddy Shutter, RN, MSN, BSN, CCM [place]  [facility], Population Health Case Manager Phone: [phone]

## 2024-06-26 ENCOUNTER — Telehealth: Payer: Self-pay

## 2024-06-26 NOTE — Telephone Encounter (Signed)
Ok for all verbals 

## 2024-06-26 NOTE — Telephone Encounter (Signed)
 Copied from CRM #[id number]. Topic: General - Other >> Jun 26, 2024  2:56 PM Alexandria E wrote: Reason for CRM: Butler, RN case manager with [facility] [facility] [employer] home health, called in to relay the plan of care for the patient. She would like to continue foley catheter changes once a month as well as monthly B12 injections. Callback number for Butler is [phone].

## 2024-06-27 NOTE — Telephone Encounter (Signed)
 Called and gave verbals.

## 2024-06-28 ENCOUNTER — Encounter: Payer: Self-pay | Admitting: Nurse Practitioner

## 2024-06-28 ENCOUNTER — Ambulatory Visit: Admitting: Nurse Practitioner

## 2024-06-28 VITALS — BP 112/60 | HR 68 | Ht 68.0 in | Wt 249.8 lb

## 2024-06-28 DIAGNOSIS — G4733 Obstructive sleep apnea (adult) (pediatric): Secondary | ICD-10-CM

## 2024-06-28 DIAGNOSIS — J9611 Chronic respiratory failure with hypoxia: Secondary | ICD-10-CM | POA: Diagnosis not present

## 2024-06-28 NOTE — Patient Instructions (Signed)
 Continue to use BiPAP every night with supplemental oxygen , minimum of 4-6 hours a night.  Change equipment as directed. Wash your tubing with warm soap and water daily, hang to dry. Wash humidifier portion weekly. Use bottled, distilled water and change daily Be aware of reduced alertness and do not drive or operate heavy machinery if experiencing this or drowsiness.  Exercise encouraged, as tolerated. Healthy weight management discussed.  Avoid or decrease alcohol consumption and medications that make you more sleepy, if possible. Notify if persistent daytime sleepiness occurs even with consistent use of PAP therapy.   Continue supplemental oxygen  3 lpm for goal >88-90%   Follow up in 6 months with Dr. Neda, Dr. Olena or Dr. Theodoro (new pt 30 min appt).  If symptoms worsen, please contact office for sooner follow up or seek emergency care.

## 2024-06-28 NOTE — Progress Notes (Signed)
 @Patient  ID: [name] [name], male    DOB: 05-15-1946, 78 y.o.   MRN: [id number]  Chief Complaint  Patient presents with   Medical Management of Chronic Issues   Sleep Apnea    Doing well with CPAP. Needs DME order for more nasal cannulas.     Referring provider: Norleen Lynwood ORN, MD  HPI: 78 year old male, former smoker (quit 1980) followed for severe OSA on BiPAP and chronic hypoxic/hypercapnic respiratory failure secondary to obesity hypoventilation syndrome. He is a former patient of Dr. Magdaleno and last seen 11/21/2023 by Malachy NP. Past medical history CHF, PAF, HTN, allergic rhinitis, DM, HLD, morbid obesity.   TEST/EVENTS:  02/09/2008 ABG: pH 7.37, pCO2 55.1, pO2 76.4 06/18/2008 PSG: AHI 64>> BiPAP 18/9 with 4 L oxygen  04/03/2009 spirometry: FEV1 2.08 (69%), FEV1% 77 01/18/2013 Ono on BiPAP: Basal SpO2 91.7%, low SpO2 83%; spent 32 minutes with SpO2 less than 88% 07/31/2019 BiPAP titration: BiPAP 16/12 with 4 L supplemental O2 11/2023 ONO on biPAP/3 lpm O2: </88% 8 min 52 sec, SpO2 low 86% with average 92%  07/19/2019: OV with Dr. Shellia.  He was told he needed a follow-up visit for oxygen  and BiPAP renewal.  Ambulatory oximetry today on room air; dropped to 86% and recovered to 90s with addition supplemental oxygen  4 L pulsed.  BiPAP titration study ordered for BiPAP/oxygen  renewal.  Excellent compliance with BiPAP.  03/19/2022: OV with Rosely Fernandez NP for overdue follow-up.  They also had some concerns regarding his oxygen .  Reported that about a month ago he was dropping into the mid to high 80s, despite his 5 L pulsed.  They increased him to 6 L which seemed to correct this.  At the same time, he was also noted to have increased weight gain and increased shortness of breath.  He was seen by cardiology, who adjusted his torsemide  with has corrected his volume status.  Today, he reports feeling better.  Feels like his breathing is at his baseline.  Has not had any more issues with oxygen  dropping into the 80s  at home.  Walking oximetry at OV - able to maintain saturations on 4 lpm POC. Excellent compliance with BiPAP.  06/25/2022: Virtual visit with Paulo Keimig NP for follow-up with his daughter.  He has been doing well since he was here last.  No further issues with weight gain or lower extremity swelling.  He also feels like his breathing has been stable.  He has been using 4 L/min supplemental O2, which has been his baseline.  No increased cough, chest congestion, orthopnea, PND.  He is wearing BiPAP nightly without fail.  No current issues.  Does feel well controlled on this. 05/25/2022-06/23/2022 BiPAP auto IPAP Richar 20, EPAP min 8, PS 4 30/30 days; 100% >4 hr; av use 13 hours Leaks median 0, 95th 76.9 IPAP pressure 95th 14.9, EPAP 10.9 AHI 5  10/06/2023: OV with Takako Minckler NP Patient presents today for follow up with his daughter. He's been doing well with his BiPAP. He wears it every night. He does have a sore on the bridge of his nose that he can't seem to get rid of. Has not had this looked it. It lays right under his mask. No exudate, pain, fevers, chills. He still has some daytime tiredness but he is very sedentary and tends to sit in the house most days during the winter. 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 Breathing has been doing well.  He uses 3-4 lpm on his oxygen . No low oxygen  levels. No significant cough, wheezing, congestion. He did not have his overnight oxygen  study after our last visit.  09/05/2023-10/04/2023: BiPAP auto 16/8, PS 4 30/30 days; 100% >4 hr; average use 10 hr 20 min Pressure 95th IPAP 14.4, EPAP 10.4 Leaks 95th 68 AHI 1.6  11/21/2023: OV with Geni Skorupski NP Patient presents today via virtual visit for follow up with his daughter. He did have his ONO on BiPAP/3 lpm but our office does not have these records from the DME. He's using his BiPAP nightly. He has not gotten the new mask that was ordered yet. The DME company told him they needed some  additional information; unclear what this is. The sore on his nose has mostly resolved with applying a small dressing prior to putting BiPAP mask on. He does sleep better with his BiPAP and energy levels are better with it. He is relatively sedentary though. No issues with his breathing. No increased oxygen  requirements. No significant cough, wheeze, congestion.  10/19/2023-11/17/2023 BiPAP auto 16/8, PS 4 30/30 days; 100% >4 hr; average use 10 hr 33 min Leaks 95th 57.2 AHI 1  06/28/2024: Today - follow up Discussed the use of AI scribe software for clinical note transcription with the patient, who gave verbal consent to proceed.  History of Present Illness [name] [name] is a 78 year old male with chronic respiratory issues who presents for follow-up after an overnight oxygen  study.  He underwent an overnight oxygen  study since his last visit, which indicated that he did well on three liters of oxygen  bled through his BiPAP.   He is currently using a BiPAP machine at night, and his download data looks good, with two missed days attributed to a recent hospital stay. He feels stable with no significant breathing issues, no bad cough, and no low oxygen  levels at home. There are no current issues with his BiPAP machine.  He received his flu shot this year.  He was hospitalized recently for cystitis and hematuria. He has a catheter in place.    No concerns or complaints today. He does not drive. No excessive daytime drowsiness.     Allergies  Allergen Reactions   Codeine Other (See Comments)    Altered mental status Confusion    Heparin  Rash     Abdominal rash 01/2013    Immunization History  Administered Date(s) Administered   Fluad Quad(high Dose 65+) 05/10/2019, 06/06/2020, 04/16/2021, 04/30/2022   Fluad Trivalent(High Dose 65+) 07/01/2023   H1N1 07/15/2008   INFLUENZA, HIGH DOSE SEASONAL PF 04/15/2015, 04/19/2017, 04/25/2018, 06/19/2024   Influenza Split 05/17/2011, 06/09/2012    Influenza Whole 05/09/2008, 06/24/2009, 06/26/2010   Influenza,inj,Quad PF,6+ Mos 04/05/2013, 04/11/2014, 04/13/2016   PFIZER(Purple Top)SARS-COV-2 Vaccination 10/07/2019, 10/31/2019, 08/30/2020   Pneumococcal Conjugate-13 06/01/2013, 10/09/2013   Pneumococcal Polysaccharide-23 02/07/2008, 10/19/2017   Td 02/07/2008   Tdap 04/25/2018   Zoster Recombinant(Shingrix) 04/06/2019   Zoster, Live 07/02/2008    Past Medical History:  Diagnosis Date   Acute right-sided CHF (congestive heart failure) (HCC)    a. 01/2013.   Arthritis    knees and hands; thoracic area of the spine (01/05/2013)   BRONCHITIS, CHRONIC 01/02/2009   Chronic kidney disease    COMMON MIGRAINE    none since treating for high BP   Complication of anesthesia    aspiration pneumonia after hand OR (01/05/2013)   DIABETES MELLITUS, TYPE II    GOUT    HYPERLIPIDEMIA    HYPERTENSION  Long term (current) use of anticoagulants    Morbid obesity (HCC)    OBESITY HYPOVENTILATION SYNDROME    a. on home O2.   On home oxygen  therapy    OSA (obstructive sleep apnea)    Oxygen  deficiency    Permanent atrial fibrillation (HCC)    PROSTATE SPECIFIC ANTIGEN, ELEVATED 06/09/2007   Pulmonary HTN (HCC)    a. multifactorial including obstructive sleep apnea, obesity hypoventilation syndrome and possible pulmonary venous hypertension.   SKIN RASH 03/12/2010   Sleep apnea    Unspecified hearing loss     Tobacco History: Social History   Tobacco Use  Smoking Status Former   Current packs/day: 0.00   Average packs/day: 1 pack/day for 15.0 years (15.0 ttl pk-yrs)   Types: Cigarettes   Start date: 08/10/1963   Quit date: 08/09/1978   Years since quitting: 45.9  Smokeless Tobacco Never  Tobacco Comments   01/05/2013 quit smoking ~ 40 years ago   Counseling given: Not Answered Tobacco comments: 01/05/2013 quit smoking ~ 40 years ago   Outpatient Medications Prior to Visit  Medication Sig Dispense Refill    acetaminophen  (TYLENOL ) 500 MG tablet Take 500-1,000 mg by mouth every 6 (six) hours as needed for moderate pain (pain score 4-6).     allopurinol  (ZYLOPRIM ) 100 MG tablet Take 1 tablet (100 mg total) by mouth daily. 90 tablet 3   ammonium lactate (LAC-HYDRIN) 12 % lotion Apply 1 Application topically as needed for dry skin.     atorvastatin  (LIPITOR) 20 MG tablet Take 1 tablet (20 mg total) by mouth daily. 90 tablet 3   cetirizine (ZYRTEC) 10 MG tablet Take 10 mg by mouth at bedtime as needed for allergies (cough).     cyanocobalamin  (VITAMIN B12) 1000 MCG/ML injection Inject 1 mL (1,000 mcg total) into the muscle every 30 (thirty) days. 3 mL 3   donepezil  (ARICEPT ) 5 MG tablet Take 1 tablet (5 mg total) by mouth at bedtime. 90 tablet 3   ELIQUIS  5 MG TABS tablet TAKE 1 TABLET TWICE A DAY 180 tablet 3   glipiZIDE  (GLUCOTROL  XL) 10 MG 24 hr tablet TAKE 1 TABLET(10 MG) BY MOUTH DAILY WITH BREAKFAST 90 tablet 3   metoprolol  tartrate (LOPRESSOR ) 25 MG tablet TAKE 1 TABLET(25 MG) BY MOUTH TWICE DAILY 180 tablet 3   nystatin  (MYCOSTATIN /NYSTOP ) powder Apply 1 Application topically 2 (two) times daily. 60 g 3   nystatin  cream (MYCOSTATIN ) Apply 1 Application topically 2 (two) times daily. 30 g 4   OXYGEN  Inhale 3 L/min into the lungs continuous.     polyethylene glycol (MIRALAX  / GLYCOLAX ) 17 g packet Take 17 g by mouth daily as needed. 14 each 0   torsemide  (DEMADEX ) 20 MG tablet TAKE 1 TABLET(20 MG) BY MOUTH DAILY 90 tablet 3   No facility-administered medications prior to visit.     Review of Systems: as above    Physical Exam:  BP 112/60   Pulse 68   Ht 5' 8 (1.727 m)   Wt 249 lb 12.8 oz (113.3 kg)   SpO2 99% Comment: 4 lpm pulsed o2  BMI 37.98 kg/m   GEN: Pleasant, interactive, chronically-ill appearing; morbidly obese; in no acute distress. HEENT:  Normocephalic and atraumatic. PERRLA. Sclera white. Nasal turbinates pink, moist and patent bilaterally. No rhinorrhea present.  Oropharynx pink and moist, without exudate or edema. No lesions, ulcerations, or postnasal drip.  NECK:  Supple w/ fair ROM. No JVD present.  CV: RRR, no m/r/g, dependent BLE  edema. Pulses intact, +2 bilaterally. No cyanosis, pallor or clubbing. PULMONARY:  Unlabored, regular breathing. Clear bilaterally A&P w/o wheezes/rales/rhonchi. No accessory muscle use. No dullness to percussion. GI: BS present and normoactive. Soft, non-tender to palpation.  MSK: No deformities or joint swelling noted. Stasis dermatitis  Neuro: A/Ox3. No focal deficits noted.   Skin: Warm, dry, intact  Psych: Normal affect and behavior. Judgement and thought content appropriate.     Lab Results:  CBC    Component Value Date/Time   WBC 10.8 (H) 06/04/2024 0349   RBC 4.10 (L) 06/04/2024 0349   HGB 11.8 (L) 06/04/2024 0349   HGB 14.2 02/26/2022 0956   HCT 39.6 06/04/2024 0349   HCT 44.1 02/26/2022 0956   PLT 329 06/04/2024 0349   PLT 276 02/26/2022 0956   MCV 96.6 06/04/2024 0349   MCV 88 02/26/2022 0956   MCH 28.8 06/04/2024 0349   MCHC 29.8 (L) 06/04/2024 0349   RDW 14.9 06/04/2024 0349   RDW 14.1 02/26/2022 0956   LYMPHSABS 3.1 06/02/2024 1703   MONOABS 1.0 06/02/2024 1703   EOSABS 0.4 06/02/2024 1703   BASOSABS 0.1 06/02/2024 1703    BMET    Component Value Date/Time   NA 140 06/04/2024 0349   NA 143 05/12/2023 1353   K 5.8 (H) 06/04/2024 0349   CL 107 06/04/2024 0349   CO2 28 06/04/2024 0349   GLUCOSE 120 (H) 06/04/2024 0349   BUN 28 (H) 06/04/2024 0349   BUN 39 (H) 05/12/2023 1353   CREATININE 1.65 (H) 06/04/2024 0349   CREATININE 1.23 (H) 04/11/2020 1409   CALCIUM  9.4 06/04/2024 0349   GFRNONAA 42 (L) 06/04/2024 0349   GFRNONAA 57 (L) 04/11/2020 1409   GFRAA 81 04/28/2020 0927   GFRAA 67 04/11/2020 1409    BNP    Component Value Date/Time   BNP 75.5 02/22/2023 1244   BNP 103.7 (H) 07/22/2014 1025     Imaging:  CT ABDOMEN PELVIS WO CONTRAST Result Date: 06/02/2024 EXAM:  CT ABDOMEN AND PELVIS WITHOUT CONTRAST 06/02/2024 06:52:18 PM TECHNIQUE: CT of the abdomen and pelvis was performed without the administration of intravenous contrast. Multiplanar reformatted images are provided for review. Automated exposure control, iterative reconstruction, and/or weight-based adjustment of the mA/kV was utilized to reduce the radiation dose to as low as reasonably achievable. COMPARISON: 11/22/2023 CLINICAL HISTORY: Abdominal pain, acute, nonlocalized; gross hematuria. FINDINGS: LOWER CHEST: No acute abnormality. LIVER: Nodular hepatic contour. Subcentimeter inferior right hepatic cyst (image 33), benign. GALLBLADDER AND BILE DUCTS: Suspected layering tiny gallstones (image 32). No biliary ductal dilatation. SPLEEN: No acute abnormality. PANCREAS: No acute abnormality. ADRENAL GLANDS: 10 mm fat density lesion in the left adrenal gland (image 23), compatible with a benign adrenal myelolipoma. Additional low density nodularity in the bilateral adrenal glands, unchanged, compatible with benign adrenal adenomas. KIDNEYS, URETERS AND BLADDER: Simple left renal cyst measuring up to 2.6 cm (image 27), benign. No follow-up is recommended. No stones in the kidneys or ureters. No hydronephrosis. No perinephric or periureteral stranding. Urinary bladder is decompressed by an indwelling Foley catheter. Mild perivesical stranding raising concern for cystitis, although similar to the prior/chronic. GI AND BOWEL: Stomach demonstrates no acute abnormality. There is no bowel obstruction. Prior appendectomy. PERITONEUM AND RETROPERITONEUM: No ascites. No free air. VASCULATURE: Atherosclerotic calcifications of the abdominal aorta and branch vessels, although patent. LYMPH NODES: No lymphadenopathy. REPRODUCTIVE ORGANS: Mild prostatomegaly. BONES AND SOFT TISSUES: No acute osseous abnormality. No focal soft tissue abnormality. IMPRESSION: 1. Mild perivesical stranding,  raising concern for cystitis. Bladder  decompressed by an indwelling Foley catheter. 2. Suspected layering tiny gallstones, without associated inflammatory changes. 3. Additional stable ancillary findings, as above. Electronically signed by: Pinkie Pebbles MD 06/02/2024 07:08 PM EDT RP Workstation: [id number]    Administration History     None           No data to display          No results found for: NITRICOXIDE      Assessment & Plan:   Obstructive sleep apnea Severe OSA/OHS. Patient is compliant with BiPAP therapy and receives benefit from use. Excellent control. Receives benefit from use. Mask leaks noted but not affecting control or efficacy. Aware of proper care/use. Understands risks of untreated OSA.   Patient Instructions  Continue to use BiPAP every night with supplemental oxygen , minimum of 4-6 hours a night.  Change equipment as directed. Wash your tubing with warm soap and water daily, hang to dry. Wash humidifier portion weekly. Use bottled, distilled water and change daily Be aware of reduced alertness and do not drive or operate heavy machinery if experiencing this or drowsiness.  Exercise encouraged, as tolerated. Healthy weight management discussed.  Avoid or decrease alcohol consumption and medications that make you more sleepy, if possible. Notify if persistent daytime sleepiness occurs even with consistent use of PAP therapy.   Continue supplemental oxygen  3 lpm for goal >88-90%   Follow up in 6 months with Dr. Neda, Dr. Olena or Dr. Theodoro (new pt 30 min appt).  If symptoms worsen, please contact office for sooner follow up or seek emergency care.    Chronic hypoxic respiratory failure (HCC) Stable without increased O2 requirements. Goal >88-90%      I spent 25 minutes of dedicated to the care of this patient on the date of this encounter to include pre-visit review of records, face-to-face time with the patient discussing conditions above, post visit ordering of testing,  clinical documentation with the electronic health record, making appropriate referrals as documented, and communicating necessary findings to members of the patients care team.  Comer LULLA Rouleau, NP 06/28/2024  Pt aware and understands NP's role.

## 2024-06-28 NOTE — Assessment & Plan Note (Signed)
 Severe OSA/OHS. Patient is compliant with BiPAP therapy and receives benefit from use. Excellent control. Receives benefit from use. Mask leaks noted but not affecting control or efficacy. Aware of proper care/use. Understands risks of untreated OSA.   Patient Instructions  Continue to use BiPAP every night with supplemental oxygen , minimum of 4-6 hours a night.  Change equipment as directed. Wash your tubing with warm soap and water daily, hang to dry. Wash humidifier portion weekly. Use bottled, distilled water and change daily Be aware of reduced alertness and do not drive or operate heavy machinery if experiencing this or drowsiness.  Exercise encouraged, as tolerated. Healthy weight management discussed.  Avoid or decrease alcohol consumption and medications that make you more sleepy, if possible. Notify if persistent daytime sleepiness occurs even with consistent use of PAP therapy.   Continue supplemental oxygen  3 lpm for goal >88-90%   Follow up in 6 months with Dr. Neda, Dr. Olena or Dr. Theodoro (new pt 30 min appt).  If symptoms worsen, please contact office for sooner follow up or seek emergency care.

## 2024-06-28 NOTE — Assessment & Plan Note (Signed)
Stable without increased O2 requirements. Goal >88-90%

## 2024-07-12 ENCOUNTER — Ambulatory Visit (INDEPENDENT_AMBULATORY_CARE_PROVIDER_SITE_OTHER): Admitting: Urology

## 2024-07-12 ENCOUNTER — Encounter: Payer: Self-pay | Admitting: Urology

## 2024-07-12 VITALS — BP 125/67 | HR 66 | Ht 68.0 in | Wt 253.0 lb

## 2024-07-12 DIAGNOSIS — R339 Retention of urine, unspecified: Secondary | ICD-10-CM

## 2024-07-12 DIAGNOSIS — Z87898 Personal history of other specified conditions: Secondary | ICD-10-CM

## 2024-07-12 DIAGNOSIS — Z87448 Personal history of other diseases of urinary system: Secondary | ICD-10-CM

## 2024-07-12 DIAGNOSIS — Z960 Presence of urogenital implants: Secondary | ICD-10-CM | POA: Diagnosis not present

## 2024-07-12 DIAGNOSIS — Z978 Presence of other specified devices: Secondary | ICD-10-CM

## 2024-07-12 NOTE — Progress Notes (Signed)
 Assessment: 1. Chronic indwelling Foley catheter   2. Urinary retention   3. History of gross hematuria     Plan: Urine sample obtained for culture for documentation purposes Continue foley with monthly changes by Home Health Return to office in 6 months  Chief Complaint:  Chief Complaint  Patient presents with   Urinary Retention    History of Present Illness:  [name] [name] is a 78 y.o. male who is seen for further evaluation of urinary retention with chronic indwelling foley catheter and recent episode of gross hematuria.SABRA He presented to the emergency room on 01/12/2023 with dysuria as well as fever.  He was found to have urinary retention and a Foley catheter was placed.  He reports that he did not have any significant urinary symptoms prior to this episode.  No prior history of retention. CT imaging from 01/12/2023 demonstrated left renal cysts, no renal calculi, mild left hydroureteronephrosis, mildly enlarged prostate. Urine culture grew 20K Klebsiella.   He was started on tamsulosin  during his hospital course.  He failed a voiding trial and was discharged on 01/19/2023 with the Foley catheter in place.  He was readmitted to the hospital on 01/30/2023 with hypotension and gross hematuria. Urine culture showed no growth. Renal ultrasound from 01/30/2023 showed no renal mass or hydronephrosis. He again failed a voiding trial during the hospital course.  His Foley was changed on 02/09/2023 and he was discharged to a skilled nursing facility. Urine culture from 04/17/2023 grew >100 K multiple species. At his visit in 10/24, his catheter had been draining well.  No gross hematuria.  No fevers or chills.  He was complaining of a rash with itching in the bilateral groin area.  He was treated with nystatin  cream. Urine culture from 06/14/2023 grew 50-100 K. Proteus.  Treated with cefdinir  x 7 days. He has continued with monthly catheter changes.  At his visit in January 2025, he continued with a  Foley catheter with monthly catheter changes.   His catheter was last changed on 08/29/2023.  He does have some mitten hematuria after the catheter change.  No recent UTI symptoms.  No fevers or chills.  He continues to have some problems with tinea cruris.  This previously resolved with nystatin  cream. He was admitted to the hospital in April 2025 with UTI and sepsis.  Urine culture grew Proteus. CT abdomen and pelvis at that time showed the Foley catheter with the balloon in the posterior urethra, bladder wall thickening.  This visit in July 2025, he continued with a Foley catheter.   No recent gross hematuria.  No fevers or chills. Urine culture from 02/18/24 grew 50-100K mixed flora. He presented to the emergency room on 06/02/2024 with gross hematuria.  Urine culture showed no growth.  CT abdomen and pelvis showed a simple left renal cyst, no stones, no hydronephrosis, decompressed bladder with indwelling Foley catheter.  He returns today for follow-up.  He has not had any further gross hematuria.  His catheter is draining fairly well.  He has noted some color change with sediment recently.  No fevers or chills. His catheter was last changed on 06/21/2024.  Portions of the above documentation were copied from a prior visit for review purposes only.  Past Medical History:  Past Medical History:  Diagnosis Date   Acute right-sided CHF (congestive heart failure) (HCC)    a. 01/2013.   Arthritis    knees and hands; thoracic area of the spine (01/05/2013)   BRONCHITIS, CHRONIC  01/02/2009   Chronic kidney disease    COMMON MIGRAINE    none since treating for high BP   Complication of anesthesia    aspiration pneumonia after hand OR (01/05/2013)   DIABETES MELLITUS, TYPE II    GOUT    HYPERLIPIDEMIA    HYPERTENSION    Long term (current) use of anticoagulants    Morbid obesity (HCC)    OBESITY HYPOVENTILATION SYNDROME    a. on home O2.   On home oxygen  therapy    OSA (obstructive  sleep apnea)    Oxygen  deficiency    Permanent atrial fibrillation (HCC)    PROSTATE SPECIFIC ANTIGEN, ELEVATED 06/09/2007   Pulmonary HTN (HCC)    a. multifactorial including obstructive sleep apnea, obesity hypoventilation syndrome and possible pulmonary venous hypertension.   SKIN RASH 03/12/2010   Sleep apnea    Unspecified hearing loss     Past Surgical History:  Past Surgical History:  Procedure Laterality Date   APPENDECTOMY  08/09/1972   APPLICATION OF ROBOTIC ASSISTANCE FOR SPINAL PROCEDURE N/A 11/23/2022   Procedure: APPLICATION OF ROBOTIC ASSISTANCE FOR SPINAL PROCEDURE;  Surgeon: Lanis Pupa, MD;  Location: [facility] OR;  Service: Neurosurgery;  Laterality: N/A;   CARDIOVERSION  05/21/2008; ~ 06/2008   FRACTURE SURGERY  2024   HERNIA REPAIR     INGUINAL HERNIA REPAIR Right    RIGHT HEART CATHETERIZATION N/A 01/08/2013   Procedure: RIGHT HEART CATH;  Surgeon: Aleene JINNY Passe, MD;  Location: [facility] CATH LAB;  Service: Cardiovascular;  Laterality: N/A;   SPINE SURGERY     TENDON REPAIR Right 08/10/2007   lacerated his tendon and pulley Dr. Lorretta   UMBILICAL HERNIA REPAIR      Allergies:  Allergies  Allergen Reactions   Codeine Other (See Comments)    Altered mental status Confusion    Heparin  Rash     Abdominal rash 01/2013    Family History:  Family History  Problem Relation Age of Onset   Alzheimer's disease Father    Prostate cancer Father    Stroke Father    Hearing loss Brother    Cancer Other        Breast Cancer, <50 yo 1st degree relative   Coronary artery disease Other        Male, 1st degree relative   Obesity Brother     Social History:  Social History   Tobacco Use   Smoking status: Former    Current packs/day: 0.00    Average packs/day: 1 pack/day for 15.0 years (15.0 ttl pk-yrs)    Types: Cigarettes    Start date: 08/10/1963    Quit date: 08/09/1978    Years since quitting: 45.9   Smokeless tobacco: Never   Tobacco comments:     01/05/2013 quit smoking ~ 40 years ago  Vaping Use   Vaping status: Never Used  Substance Use Topics   Alcohol use: Yes    Alcohol/week: 0.0 standard drinks of alcohol    Comment: 01/05/2013 hasn't had a drink since 1980's; never had problem w/it   Drug use: No    ROS: Constitutional:  Negative for fever, chills, weight loss CV: Negative for chest pain, previous MI, hypertension Respiratory:  Negative for shortness of breath, wheezing, sleep apnea, frequent cough GI:  Negative for nausea, vomiting, bloody stool, GERD  Physical exam: BP 125/67   Pulse 66   Ht 5' 8 (1.727 m)   Wt 253 lb (114.8 kg)   BMI 38.47 kg/m  GENERAL  APPEARANCE:  Well appearing, well developed, well nourished, NAD EXTREMITIES:  Without clubbing, cyanosis, or edema MENTAL STATUS:  appropriate SKIN:  Warm, dry, and intact GU:  foley draining yellow urine with sediment  Results: None

## 2024-07-13 ENCOUNTER — Ambulatory Visit: Admitting: Podiatry

## 2024-07-16 LAB — URINE CULTURE

## 2024-07-17 ENCOUNTER — Ambulatory Visit: Payer: Self-pay | Admitting: Urology

## 2024-07-18 ENCOUNTER — Other Ambulatory Visit: Payer: Self-pay

## 2024-07-18 ENCOUNTER — Encounter: Payer: Self-pay | Admitting: Internal Medicine

## 2024-07-18 MED ORDER — ATORVASTATIN CALCIUM 20 MG PO TABS: 20.0000 mg | ORAL_TABLET | Freq: Every day | ORAL | 3 refills | Status: AC

## 2024-08-21 ENCOUNTER — Telehealth: Payer: Self-pay

## 2024-08-21 DIAGNOSIS — Z978 Presence of other specified devices: Secondary | ICD-10-CM

## 2024-08-21 DIAGNOSIS — E538 Deficiency of other specified B group vitamins: Secondary | ICD-10-CM

## 2024-08-21 NOTE — Telephone Encounter (Signed)
 Copied from CRM [id number]. Topic: Referral - Status >> Aug 21, 2024  9:37 AM [place] J wrote: Reason for CRM: Corean calling from [facility] to let Dr. Norleen know that their company is coming out with new policies. Any patient with [employer] needs to be discharged and a new referral from the provider has to be sent.  She would like for Dr. Norleen to send in a new referral for skilled nursing. This is to continue with his catheter changes and B12 injections. A specific date for next week needs to be added as well, since moving forward, if they do not see the patient within 48 hours of the referral being received, they cannot see the patient.  Fax: [phone]

## 2024-08-22 ENCOUNTER — Other Ambulatory Visit: Payer: Self-pay

## 2024-08-22 ENCOUNTER — Encounter: Payer: Self-pay | Admitting: Internal Medicine

## 2024-08-22 MED ORDER — CYANOCOBALAMIN 1000 MCG/ML IJ SOLN: 1000.0000 ug | INTRAMUSCULAR | 3 refills | Status: AC

## 2024-08-22 MED ORDER — GLIPIZIDE ER 10 MG PO TB24: 10.0000 mg | ORAL_TABLET | Freq: Every day | ORAL | 3 refills | Status: AC

## 2024-08-22 MED ORDER — METOPROLOL TARTRATE 25 MG PO TABS: 25.0000 mg | ORAL_TABLET | Freq: Two times a day (BID) | ORAL | 3 refills | Status: AC

## 2024-08-22 NOTE — Telephone Encounter (Signed)
Ok this referral is done 

## 2024-08-24 ENCOUNTER — Ambulatory Visit: Admitting: Urology

## 2024-09-07 ENCOUNTER — Ambulatory Visit: Admitting: Internal Medicine

## 2025-01-11 ENCOUNTER — Ambulatory Visit: Admitting: Urology
